# Patient Record
Sex: Female | Born: 1935 | Race: White | Hispanic: No
Health system: Southern US, Community
[De-identification: ages and names within clinical notes are randomized; demographics above are authoritative.]

## PROBLEM LIST (undated history)

## (undated) DIAGNOSIS — N179 Acute kidney failure, unspecified: Secondary | ICD-10-CM

## (undated) DIAGNOSIS — N183 Chronic kidney disease, stage 3 unspecified: Secondary | ICD-10-CM

## (undated) DIAGNOSIS — I82409 Acute embolism and thrombosis of unspecified deep veins of unspecified lower extremity: Secondary | ICD-10-CM

## (undated) DIAGNOSIS — I639 Cerebral infarction, unspecified: Secondary | ICD-10-CM

## (undated) DIAGNOSIS — R7989 Other specified abnormal findings of blood chemistry: Secondary | ICD-10-CM

## (undated) DIAGNOSIS — R011 Cardiac murmur, unspecified: Secondary | ICD-10-CM

## (undated) DIAGNOSIS — R519 Headache, unspecified: Secondary | ICD-10-CM

## (undated) DIAGNOSIS — I2699 Other pulmonary embolism without acute cor pulmonale: Secondary | ICD-10-CM

## (undated) DIAGNOSIS — G9340 Encephalopathy, unspecified: Secondary | ICD-10-CM

## (undated) DIAGNOSIS — R51 Headache: Secondary | ICD-10-CM

## (undated) DIAGNOSIS — R339 Retention of urine, unspecified: Secondary | ICD-10-CM

## (undated) DIAGNOSIS — C189 Malignant neoplasm of colon, unspecified: Secondary | ICD-10-CM

## (undated) DIAGNOSIS — I4891 Unspecified atrial fibrillation: Secondary | ICD-10-CM

## (undated) DIAGNOSIS — I10 Essential (primary) hypertension: Secondary | ICD-10-CM

## (undated) DIAGNOSIS — R197 Diarrhea, unspecified: Secondary | ICD-10-CM

## (undated) DIAGNOSIS — E875 Hyperkalemia: Secondary | ICD-10-CM

## (undated) DIAGNOSIS — D649 Anemia, unspecified: Secondary | ICD-10-CM

## (undated) DIAGNOSIS — R569 Unspecified convulsions: Secondary | ICD-10-CM

## (undated) HISTORY — DX: Unspecified atrial fibrillation: I48.91

## (undated) HISTORY — DX: Chronic kidney disease, stage 3 unspecified: N18.30

## (undated) HISTORY — DX: Acute embolism and thrombosis of unspecified deep veins of unspecified lower extremity: I82.409

## (undated) HISTORY — DX: Headache: R51

## (undated) HISTORY — DX: Malignant neoplasm of colon, unspecified: C18.9

## (undated) HISTORY — DX: Anemia, unspecified: D64.9

## (undated) HISTORY — DX: Chronic kidney disease, stage 3 (moderate): N18.3

## (undated) HISTORY — DX: Essential (primary) hypertension: I10

## (undated) HISTORY — DX: Other specified abnormal findings of blood chemistry: R79.89

## (undated) HISTORY — DX: Cerebral infarction, unspecified: I63.9

## (undated) HISTORY — DX: Headache, unspecified: R51.9

## (undated) HISTORY — DX: Encephalopathy, unspecified: G93.40

## (undated) HISTORY — DX: Acute kidney failure, unspecified: N17.9

## (undated) HISTORY — DX: Cardiac murmur, unspecified: R01.1

## (undated) HISTORY — DX: Diarrhea, unspecified: R19.7

## (undated) HISTORY — DX: Unspecified convulsions: R56.9

## (undated) HISTORY — DX: Hyperkalemia: E87.5

## (undated) HISTORY — DX: Other pulmonary embolism without acute cor pulmonale: I26.99

---
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
## 1971-07-07 HISTORY — PX: TOTAL ABDOMINAL HYSTERECTOMY W/ BILATERAL SALPINGOOPHORECTOMY: SHX83

## 2004-07-06 HISTORY — PX: HIP FRACTURE SURGERY: SHX118

## 2004-08-10 ENCOUNTER — Encounter: Payer: Self-pay | Admitting: Emergency Medicine

## 2004-08-11 ENCOUNTER — Ambulatory Visit: Payer: Self-pay | Admitting: Physical Medicine & Rehabilitation

## 2004-08-11 ENCOUNTER — Inpatient Hospital Stay (HOSPITAL_COMMUNITY): Admission: AD | Admit: 2004-08-11 | Discharge: 2004-08-14 | Payer: Self-pay | Admitting: General Surgery

## 2004-08-14 ENCOUNTER — Inpatient Hospital Stay
Admission: RE | Admit: 2004-08-14 | Discharge: 2004-08-21 | Payer: Self-pay | Admitting: Physical Medicine & Rehabilitation

## 2005-05-06 DIAGNOSIS — I2699 Other pulmonary embolism without acute cor pulmonale: Secondary | ICD-10-CM

## 2005-05-06 DIAGNOSIS — I82409 Acute embolism and thrombosis of unspecified deep veins of unspecified lower extremity: Secondary | ICD-10-CM

## 2005-05-06 HISTORY — DX: Acute embolism and thrombosis of unspecified deep veins of unspecified lower extremity: I82.409

## 2005-05-06 HISTORY — DX: Other pulmonary embolism without acute cor pulmonale: I26.99

## 2005-06-01 ENCOUNTER — Ambulatory Visit: Payer: Self-pay | Admitting: Internal Medicine

## 2005-06-01 ENCOUNTER — Inpatient Hospital Stay (HOSPITAL_COMMUNITY): Admission: EM | Admit: 2005-06-01 | Discharge: 2005-06-09 | Payer: Self-pay | Admitting: Emergency Medicine

## 2005-06-01 ENCOUNTER — Encounter: Admission: RE | Admit: 2005-06-01 | Discharge: 2005-06-01 | Payer: Self-pay | Admitting: Orthopedic Surgery

## 2005-06-06 ENCOUNTER — Ambulatory Visit: Payer: Self-pay | Admitting: Internal Medicine

## 2005-07-08 ENCOUNTER — Ambulatory Visit: Payer: Self-pay | Admitting: Internal Medicine

## 2005-07-16 ENCOUNTER — Ambulatory Visit: Payer: Self-pay | Admitting: Internal Medicine

## 2005-07-24 ENCOUNTER — Ambulatory Visit: Payer: Self-pay | Admitting: Internal Medicine

## 2005-08-03 ENCOUNTER — Ambulatory Visit: Payer: Self-pay | Admitting: Internal Medicine

## 2005-08-14 ENCOUNTER — Ambulatory Visit: Payer: Self-pay | Admitting: Internal Medicine

## 2005-09-07 ENCOUNTER — Ambulatory Visit: Payer: Self-pay | Admitting: Internal Medicine

## 2005-09-28 ENCOUNTER — Ambulatory Visit: Payer: Self-pay | Admitting: Internal Medicine

## 2005-10-05 ENCOUNTER — Ambulatory Visit: Payer: Self-pay | Admitting: Internal Medicine

## 2005-10-12 ENCOUNTER — Ambulatory Visit: Payer: Self-pay | Admitting: Internal Medicine

## 2005-10-26 ENCOUNTER — Ambulatory Visit: Payer: Self-pay | Admitting: Internal Medicine

## 2005-11-09 ENCOUNTER — Ambulatory Visit: Payer: Self-pay | Admitting: Internal Medicine

## 2005-12-04 ENCOUNTER — Ambulatory Visit: Payer: Self-pay | Admitting: Internal Medicine

## 2006-01-01 ENCOUNTER — Ambulatory Visit: Payer: Self-pay | Admitting: Internal Medicine

## 2006-02-01 ENCOUNTER — Ambulatory Visit: Payer: Self-pay | Admitting: Internal Medicine

## 2006-02-17 ENCOUNTER — Ambulatory Visit: Payer: Self-pay | Admitting: Internal Medicine

## 2006-03-03 ENCOUNTER — Ambulatory Visit: Payer: Self-pay | Admitting: Internal Medicine

## 2006-04-05 ENCOUNTER — Ambulatory Visit: Payer: Self-pay | Admitting: Internal Medicine

## 2006-04-30 ENCOUNTER — Ambulatory Visit: Payer: Self-pay | Admitting: Internal Medicine

## 2006-05-21 ENCOUNTER — Ambulatory Visit: Payer: Self-pay | Admitting: Internal Medicine

## 2006-06-18 ENCOUNTER — Ambulatory Visit: Payer: Self-pay | Admitting: Internal Medicine

## 2006-07-12 ENCOUNTER — Ambulatory Visit: Payer: Self-pay | Admitting: Internal Medicine

## 2006-07-15 DIAGNOSIS — D649 Anemia, unspecified: Secondary | ICD-10-CM

## 2006-07-15 DIAGNOSIS — Z86718 Personal history of other venous thrombosis and embolism: Secondary | ICD-10-CM | POA: Insufficient documentation

## 2006-07-26 ENCOUNTER — Encounter: Admission: RE | Admit: 2006-07-26 | Discharge: 2006-07-26 | Payer: Self-pay | Admitting: Orthopedic Surgery

## 2006-08-09 ENCOUNTER — Ambulatory Visit: Payer: Self-pay | Admitting: Internal Medicine

## 2006-09-06 ENCOUNTER — Ambulatory Visit: Payer: Self-pay | Admitting: Internal Medicine

## 2006-10-08 ENCOUNTER — Ambulatory Visit: Payer: Self-pay | Admitting: Internal Medicine

## 2006-11-05 ENCOUNTER — Ambulatory Visit: Payer: Self-pay | Admitting: Internal Medicine

## 2006-12-06 ENCOUNTER — Ambulatory Visit: Payer: Self-pay | Admitting: Internal Medicine

## 2006-12-28 ENCOUNTER — Ambulatory Visit: Payer: Self-pay | Admitting: Internal Medicine

## 2006-12-28 DIAGNOSIS — M25559 Pain in unspecified hip: Secondary | ICD-10-CM

## 2006-12-29 ENCOUNTER — Telehealth (INDEPENDENT_AMBULATORY_CARE_PROVIDER_SITE_OTHER): Payer: Self-pay | Admitting: *Deleted

## 2006-12-30 ENCOUNTER — Ambulatory Visit: Payer: Self-pay | Admitting: Cardiology

## 2006-12-30 ENCOUNTER — Encounter: Payer: Self-pay | Admitting: Internal Medicine

## 2007-01-03 ENCOUNTER — Inpatient Hospital Stay (HOSPITAL_COMMUNITY): Admission: RE | Admit: 2007-01-03 | Discharge: 2007-01-10 | Payer: Self-pay | Admitting: Orthopedic Surgery

## 2007-01-03 HISTORY — PX: TOTAL HIP ARTHROPLASTY: SHX124

## 2007-02-15 ENCOUNTER — Ambulatory Visit: Payer: Self-pay | Admitting: Internal Medicine

## 2007-03-01 ENCOUNTER — Ambulatory Visit: Payer: Self-pay | Admitting: Internal Medicine

## 2007-03-01 LAB — CONVERTED CEMR LAB: Prothrombin Time: 20.4 s

## 2007-04-01 ENCOUNTER — Encounter: Admission: RE | Admit: 2007-04-01 | Discharge: 2007-04-01 | Payer: Self-pay | Admitting: Orthopedic Surgery

## 2007-04-01 ENCOUNTER — Ambulatory Visit: Payer: Self-pay | Admitting: Internal Medicine

## 2007-04-22 ENCOUNTER — Ambulatory Visit: Payer: Self-pay | Admitting: Internal Medicine

## 2007-04-22 LAB — CONVERTED CEMR LAB: INR: 2.5

## 2007-05-20 ENCOUNTER — Ambulatory Visit: Payer: Self-pay | Admitting: Internal Medicine

## 2007-05-20 LAB — CONVERTED CEMR LAB: Prothrombin Time: 3.4 s

## 2007-06-24 ENCOUNTER — Ambulatory Visit: Payer: Self-pay | Admitting: Internal Medicine

## 2007-06-24 LAB — CONVERTED CEMR LAB: INR: 2.8

## 2007-07-13 ENCOUNTER — Telehealth (INDEPENDENT_AMBULATORY_CARE_PROVIDER_SITE_OTHER): Payer: Self-pay | Admitting: *Deleted

## 2007-07-29 ENCOUNTER — Ambulatory Visit: Payer: Self-pay | Admitting: Internal Medicine

## 2007-09-23 ENCOUNTER — Ambulatory Visit: Payer: Self-pay | Admitting: Internal Medicine

## 2007-09-23 LAB — CONVERTED CEMR LAB
INR: 2.3
Prothrombin Time: 18.4 s

## 2007-10-26 ENCOUNTER — Telehealth (INDEPENDENT_AMBULATORY_CARE_PROVIDER_SITE_OTHER): Payer: Self-pay | Admitting: *Deleted

## 2007-10-31 ENCOUNTER — Ambulatory Visit: Payer: Self-pay | Admitting: Internal Medicine

## 2007-10-31 LAB — CONVERTED CEMR LAB
INR: 1.6
Prothrombin Time: 15.5 s

## 2007-11-11 ENCOUNTER — Ambulatory Visit: Payer: Self-pay | Admitting: Internal Medicine

## 2007-11-11 DIAGNOSIS — H269 Unspecified cataract: Secondary | ICD-10-CM

## 2007-11-11 LAB — CONVERTED CEMR LAB
INR: 1.2
Prothrombin Time: 13.8 s

## 2007-11-14 ENCOUNTER — Encounter (INDEPENDENT_AMBULATORY_CARE_PROVIDER_SITE_OTHER): Payer: Self-pay | Admitting: *Deleted

## 2007-11-14 ENCOUNTER — Telehealth (INDEPENDENT_AMBULATORY_CARE_PROVIDER_SITE_OTHER): Payer: Self-pay | Admitting: *Deleted

## 2007-12-02 ENCOUNTER — Encounter: Payer: Self-pay | Admitting: Internal Medicine

## 2007-12-02 ENCOUNTER — Ambulatory Visit: Payer: Self-pay

## 2007-12-16 ENCOUNTER — Ambulatory Visit: Payer: Self-pay | Admitting: Internal Medicine

## 2007-12-16 DIAGNOSIS — I10 Essential (primary) hypertension: Secondary | ICD-10-CM | POA: Insufficient documentation

## 2007-12-16 LAB — CONVERTED CEMR LAB

## 2008-01-17 ENCOUNTER — Ambulatory Visit: Payer: Self-pay | Admitting: Internal Medicine

## 2008-01-17 LAB — CONVERTED CEMR LAB: INR: 2.2

## 2008-02-03 ENCOUNTER — Encounter: Admission: RE | Admit: 2008-02-03 | Discharge: 2008-02-03 | Payer: Self-pay | Admitting: Orthopedic Surgery

## 2008-02-07 ENCOUNTER — Ambulatory Visit: Payer: Self-pay | Admitting: Internal Medicine

## 2008-02-27 ENCOUNTER — Telehealth (INDEPENDENT_AMBULATORY_CARE_PROVIDER_SITE_OTHER): Payer: Self-pay | Admitting: *Deleted

## 2008-02-29 ENCOUNTER — Ambulatory Visit: Payer: Self-pay | Admitting: Internal Medicine

## 2008-03-30 ENCOUNTER — Ambulatory Visit: Payer: Self-pay | Admitting: Internal Medicine

## 2008-03-30 LAB — CONVERTED CEMR LAB: INR: 5.5

## 2008-04-12 ENCOUNTER — Telehealth (INDEPENDENT_AMBULATORY_CARE_PROVIDER_SITE_OTHER): Payer: Self-pay | Admitting: *Deleted

## 2008-04-20 ENCOUNTER — Ambulatory Visit: Payer: Self-pay | Admitting: Internal Medicine

## 2008-04-20 LAB — CONVERTED CEMR LAB: INR: 1.9

## 2008-05-18 ENCOUNTER — Ambulatory Visit: Payer: Self-pay | Admitting: Internal Medicine

## 2008-06-13 ENCOUNTER — Telehealth (INDEPENDENT_AMBULATORY_CARE_PROVIDER_SITE_OTHER): Payer: Self-pay | Admitting: *Deleted

## 2008-06-15 ENCOUNTER — Ambulatory Visit: Payer: Self-pay | Admitting: Internal Medicine

## 2008-06-15 LAB — CONVERTED CEMR LAB: INR: 1.9

## 2008-07-20 ENCOUNTER — Ambulatory Visit: Payer: Self-pay | Admitting: Internal Medicine

## 2008-07-20 LAB — CONVERTED CEMR LAB: INR: 4.2

## 2008-08-17 ENCOUNTER — Ambulatory Visit: Payer: Self-pay | Admitting: Internal Medicine

## 2008-08-23 ENCOUNTER — Ambulatory Visit: Payer: Self-pay | Admitting: Internal Medicine

## 2008-08-23 DIAGNOSIS — IMO0001 Reserved for inherently not codable concepts without codable children: Secondary | ICD-10-CM

## 2008-08-25 LAB — CONVERTED CEMR LAB
Iron: 55 ug/dL (ref 42–145)
Potassium: 4.5 meq/L (ref 3.5–5.1)

## 2008-08-27 ENCOUNTER — Encounter (INDEPENDENT_AMBULATORY_CARE_PROVIDER_SITE_OTHER): Payer: Self-pay | Admitting: *Deleted

## 2008-09-03 ENCOUNTER — Telehealth (INDEPENDENT_AMBULATORY_CARE_PROVIDER_SITE_OTHER): Payer: Self-pay | Admitting: *Deleted

## 2008-09-06 ENCOUNTER — Telehealth (INDEPENDENT_AMBULATORY_CARE_PROVIDER_SITE_OTHER): Payer: Self-pay | Admitting: *Deleted

## 2008-09-28 ENCOUNTER — Ambulatory Visit: Payer: Self-pay | Admitting: Internal Medicine

## 2008-09-28 LAB — CONVERTED CEMR LAB: INR: 2.7

## 2008-10-29 ENCOUNTER — Ambulatory Visit: Payer: Self-pay | Admitting: Internal Medicine

## 2008-11-05 ENCOUNTER — Ambulatory Visit: Payer: Self-pay | Admitting: Internal Medicine

## 2008-11-26 ENCOUNTER — Ambulatory Visit: Payer: Self-pay | Admitting: Internal Medicine

## 2008-11-26 LAB — CONVERTED CEMR LAB: INR: 1.8

## 2008-12-05 ENCOUNTER — Telehealth (INDEPENDENT_AMBULATORY_CARE_PROVIDER_SITE_OTHER): Payer: Self-pay | Admitting: *Deleted

## 2008-12-25 ENCOUNTER — Ambulatory Visit: Payer: Self-pay | Admitting: Internal Medicine

## 2009-01-01 ENCOUNTER — Telehealth (INDEPENDENT_AMBULATORY_CARE_PROVIDER_SITE_OTHER): Payer: Self-pay | Admitting: *Deleted

## 2009-01-21 ENCOUNTER — Ambulatory Visit: Payer: Self-pay | Admitting: Internal Medicine

## 2009-02-12 ENCOUNTER — Ambulatory Visit: Payer: Self-pay | Admitting: Internal Medicine

## 2009-02-28 ENCOUNTER — Telehealth (INDEPENDENT_AMBULATORY_CARE_PROVIDER_SITE_OTHER): Payer: Self-pay | Admitting: *Deleted

## 2009-03-05 ENCOUNTER — Ambulatory Visit: Payer: Self-pay | Admitting: Internal Medicine

## 2009-03-25 ENCOUNTER — Ambulatory Visit: Payer: Self-pay | Admitting: Internal Medicine

## 2009-03-25 LAB — CONVERTED CEMR LAB: INR: 2.8

## 2009-04-15 ENCOUNTER — Ambulatory Visit: Payer: Self-pay | Admitting: Internal Medicine

## 2009-05-27 ENCOUNTER — Ambulatory Visit: Payer: Self-pay | Admitting: Internal Medicine

## 2009-06-04 ENCOUNTER — Telehealth (INDEPENDENT_AMBULATORY_CARE_PROVIDER_SITE_OTHER): Payer: Self-pay | Admitting: *Deleted

## 2009-06-24 ENCOUNTER — Ambulatory Visit: Payer: Self-pay | Admitting: Internal Medicine

## 2009-06-25 ENCOUNTER — Encounter: Payer: Self-pay | Admitting: Internal Medicine

## 2009-06-25 LAB — CONVERTED CEMR LAB
INR: 2.4 — ABNORMAL HIGH (ref <1.50)
Prothrombin Time: 26 s — ABNORMAL HIGH (ref 11.6–15.2)

## 2009-06-26 ENCOUNTER — Telehealth (INDEPENDENT_AMBULATORY_CARE_PROVIDER_SITE_OTHER): Payer: Self-pay | Admitting: *Deleted

## 2009-07-06 DIAGNOSIS — C189 Malignant neoplasm of colon, unspecified: Secondary | ICD-10-CM

## 2009-07-06 HISTORY — DX: Malignant neoplasm of colon, unspecified: C18.9

## 2009-08-12 ENCOUNTER — Ambulatory Visit: Payer: Self-pay | Admitting: Internal Medicine

## 2009-08-13 ENCOUNTER — Telehealth (INDEPENDENT_AMBULATORY_CARE_PROVIDER_SITE_OTHER): Payer: Self-pay | Admitting: *Deleted

## 2009-09-03 ENCOUNTER — Telehealth (INDEPENDENT_AMBULATORY_CARE_PROVIDER_SITE_OTHER): Payer: Self-pay | Admitting: *Deleted

## 2009-09-20 ENCOUNTER — Ambulatory Visit: Payer: Self-pay | Admitting: Internal Medicine

## 2009-09-20 DIAGNOSIS — I951 Orthostatic hypotension: Secondary | ICD-10-CM

## 2009-09-20 LAB — CONVERTED CEMR LAB: INR: 3.1

## 2009-10-18 ENCOUNTER — Ambulatory Visit: Payer: Self-pay | Admitting: Internal Medicine

## 2009-10-18 DIAGNOSIS — R42 Dizziness and giddiness: Secondary | ICD-10-CM

## 2009-11-11 ENCOUNTER — Ambulatory Visit: Payer: Self-pay | Admitting: Internal Medicine

## 2009-11-11 ENCOUNTER — Ambulatory Visit: Payer: Self-pay

## 2010-02-27 ENCOUNTER — Telehealth (INDEPENDENT_AMBULATORY_CARE_PROVIDER_SITE_OTHER): Payer: Self-pay | Admitting: *Deleted

## 2010-06-02 ENCOUNTER — Telehealth (INDEPENDENT_AMBULATORY_CARE_PROVIDER_SITE_OTHER): Payer: Self-pay | Admitting: *Deleted

## 2010-06-05 HISTORY — PX: COLECTOMY: SHX59

## 2010-06-11 ENCOUNTER — Ambulatory Visit: Payer: Self-pay | Admitting: Internal Medicine

## 2010-06-11 ENCOUNTER — Inpatient Hospital Stay (HOSPITAL_COMMUNITY)
Admission: EM | Admit: 2010-06-11 | Discharge: 2010-07-01 | Payer: Self-pay | Source: Home / Self Care | Attending: Internal Medicine | Admitting: Internal Medicine

## 2010-06-11 ENCOUNTER — Encounter: Payer: Self-pay | Admitting: Internal Medicine

## 2010-06-11 DIAGNOSIS — R0609 Other forms of dyspnea: Secondary | ICD-10-CM

## 2010-06-11 DIAGNOSIS — R0989 Other specified symptoms and signs involving the circulatory and respiratory systems: Secondary | ICD-10-CM

## 2010-06-12 ENCOUNTER — Encounter (INDEPENDENT_AMBULATORY_CARE_PROVIDER_SITE_OTHER): Payer: Self-pay | Admitting: Internal Medicine

## 2010-06-16 ENCOUNTER — Encounter: Payer: Self-pay | Admitting: Gastroenterology

## 2010-06-16 LAB — HM COLONOSCOPY: HM Colonoscopy: ABNORMAL

## 2010-06-18 ENCOUNTER — Encounter (INDEPENDENT_AMBULATORY_CARE_PROVIDER_SITE_OTHER): Payer: Self-pay | Admitting: Internal Medicine

## 2010-06-24 ENCOUNTER — Ambulatory Visit: Payer: Self-pay | Admitting: Oncology

## 2010-06-26 ENCOUNTER — Telehealth: Payer: Self-pay | Admitting: Internal Medicine

## 2010-06-26 ENCOUNTER — Encounter (INDEPENDENT_AMBULATORY_CARE_PROVIDER_SITE_OTHER): Payer: Self-pay | Admitting: Internal Medicine

## 2010-06-28 HISTORY — PX: VENA CAVA FILTER PLACEMENT: SUR1032

## 2010-07-04 ENCOUNTER — Telehealth: Payer: Self-pay | Admitting: Internal Medicine

## 2010-07-08 ENCOUNTER — Other Ambulatory Visit: Payer: Self-pay | Admitting: Internal Medicine

## 2010-07-08 ENCOUNTER — Ambulatory Visit
Admission: RE | Admit: 2010-07-08 | Discharge: 2010-07-08 | Payer: Self-pay | Source: Home / Self Care | Attending: Internal Medicine | Admitting: Internal Medicine

## 2010-07-08 DIAGNOSIS — I4891 Unspecified atrial fibrillation: Secondary | ICD-10-CM | POA: Insufficient documentation

## 2010-07-08 DIAGNOSIS — Z85038 Personal history of other malignant neoplasm of large intestine: Secondary | ICD-10-CM | POA: Insufficient documentation

## 2010-07-09 LAB — CBC WITH DIFFERENTIAL/PLATELET
Basophils Absolute: 0 10*3/uL (ref 0.0–0.1)
Basophils Relative: 0.8 % (ref 0.0–3.0)
Eosinophils Absolute: 0.2 10*3/uL (ref 0.0–0.7)
Eosinophils Relative: 4.1 % (ref 0.0–5.0)
HCT: 33.7 % — ABNORMAL LOW (ref 36.0–46.0)
Hemoglobin: 11.1 g/dL — ABNORMAL LOW (ref 12.0–15.0)
Lymphocytes Relative: 21.7 % (ref 12.0–46.0)
Lymphs Abs: 0.8 10*3/uL (ref 0.7–4.0)
MCHC: 33.1 g/dL (ref 30.0–36.0)
MCV: 86 fl (ref 78.0–100.0)
Monocytes Absolute: 0.6 10*3/uL (ref 0.1–1.0)
Monocytes Relative: 14.5 % — ABNORMAL HIGH (ref 3.0–12.0)
Neutro Abs: 2.3 10*3/uL (ref 1.4–7.7)
Neutrophils Relative %: 58.9 % (ref 43.0–77.0)
Platelets: 167 10*3/uL (ref 150.0–400.0)
RBC: 3.92 Mil/uL (ref 3.87–5.11)
RDW: 25.3 % — ABNORMAL HIGH (ref 11.5–14.6)
WBC: 3.8 10*3/uL — ABNORMAL LOW (ref 4.5–10.5)

## 2010-07-14 ENCOUNTER — Telehealth: Payer: Self-pay | Admitting: Internal Medicine

## 2010-07-14 ENCOUNTER — Encounter: Payer: Self-pay | Admitting: Internal Medicine

## 2010-07-31 ENCOUNTER — Telehealth (INDEPENDENT_AMBULATORY_CARE_PROVIDER_SITE_OTHER): Payer: Self-pay | Admitting: *Deleted

## 2010-07-31 ENCOUNTER — Other Ambulatory Visit: Payer: Self-pay | Admitting: Internal Medicine

## 2010-07-31 ENCOUNTER — Ambulatory Visit
Admission: RE | Admit: 2010-07-31 | Discharge: 2010-07-31 | Payer: Self-pay | Source: Home / Self Care | Attending: Internal Medicine | Admitting: Internal Medicine

## 2010-07-31 DIAGNOSIS — R1310 Dysphagia, unspecified: Secondary | ICD-10-CM | POA: Insufficient documentation

## 2010-08-01 ENCOUNTER — Ambulatory Visit: Payer: Self-pay | Admitting: Oncology

## 2010-08-01 ENCOUNTER — Telehealth (INDEPENDENT_AMBULATORY_CARE_PROVIDER_SITE_OTHER): Payer: Self-pay | Admitting: *Deleted

## 2010-08-01 LAB — CREATININE, SERUM: Creatinine, Ser: 1.2 mg/dL (ref 0.4–1.2)

## 2010-08-01 LAB — BUN: BUN: 8 mg/dL (ref 6–23)

## 2010-08-01 LAB — CBC WITH DIFFERENTIAL/PLATELET
Basophils Absolute: 0 10*3/uL (ref 0.0–0.1)
Basophils Relative: 0 % (ref 0.0–3.0)
Eosinophils Absolute: 0.2 10*3/uL (ref 0.0–0.7)
Lymphocytes Relative: 34.7 % (ref 12.0–46.0)
Lymphs Abs: 1.3 10*3/uL (ref 0.7–4.0)
MCHC: 32.2 g/dL (ref 30.0–36.0)
Monocytes Absolute: 0.7 10*3/uL (ref 0.1–1.0)
Monocytes Relative: 19.5 % — ABNORMAL HIGH (ref 3.0–12.0)
Neutro Abs: 1.5 10*3/uL (ref 1.4–7.7)
RBC: 4.35 Mil/uL (ref 3.87–5.11)
WBC: 3.7 10*3/uL — ABNORMAL LOW (ref 4.5–10.5)

## 2010-08-01 LAB — POTASSIUM: Potassium: 4 mEq/L (ref 3.5–5.1)

## 2010-08-04 ENCOUNTER — Telehealth (INDEPENDENT_AMBULATORY_CARE_PROVIDER_SITE_OTHER): Payer: Self-pay | Admitting: *Deleted

## 2010-08-05 ENCOUNTER — Encounter: Payer: Self-pay | Admitting: Internal Medicine

## 2010-08-05 LAB — CBC WITH DIFFERENTIAL/PLATELET
BASO%: 0.4 % (ref 0.0–2.0)
Eosinophils Absolute: 0.1 10*3/uL (ref 0.0–0.5)
MCHC: 32.6 g/dL (ref 31.5–36.0)
MCV: 86.1 fL (ref 79.5–101.0)
MONO%: 12.1 % (ref 0.0–14.0)
NEUT#: 2.1 10*3/uL (ref 1.5–6.5)
RBC: 4.4 10*6/uL (ref 3.70–5.45)
RDW: 19.6 % — ABNORMAL HIGH (ref 11.2–14.5)
WBC: 3.5 10*3/uL — ABNORMAL LOW (ref 3.9–10.3)

## 2010-08-05 LAB — COMPREHENSIVE METABOLIC PANEL
ALT: <8 U/L (ref 0–35)
AST: 13 U/L (ref 0–37)
Alkaline Phosphatase: 44 U/L (ref 39–117)
CO2: 23 mEq/L (ref 19–32)
Creatinine, Ser: 1.02 mg/dL (ref 0.40–1.20)
Sodium: 141 mEq/L (ref 135–145)
Total Bilirubin: 0.5 mg/dL (ref 0.3–1.2)
Total Protein: 6.3 g/dL (ref 6.0–8.3)

## 2010-08-05 LAB — CEA: CEA: 1.9 ng/mL (ref 0.0–5.0)

## 2010-08-05 NOTE — Progress Notes (Signed)
Summary: refill request  Phone Note Refill Request Message from:  Fax from Pharmacy on September 03, 2009 3:01 PM  Refills Requested: Medication #1:  BENAZEPRIL HCL 40 MG  TABS Take one daily   Dosage confirmed as above?Dosage Confirmed  Medication #2:  HYDROCHLOROTHIAZIDE 12.5 MG  CAPS 1 qd   Dosage confirmed as above?Dosage Confirmed refill authorization to [facility] on [address] in [place]  Next Appointment Scheduled: 09/20/09 Initial call taken by: Michaelle Copas,  September 03, 2009 3:02 PM    Prescriptions: HYDROCHLOROTHIAZIDE 12.5 MG  CAPS (HYDROCHLOROTHIAZIDE) 1 qd  #90 x 1   Entered by:   Shonna Chock   Authorized by:   Marga Melnick MD   Signed by:   Shonna Chock on 09/03/2009   Method used:   Electronically to        [name] ([facility])       [address] [place], [place]  [address]       Ph: [phone]       Fax: [phone]   RxID:   [id number] BENAZEPRIL HCL 40 MG  TABS (BENAZEPRIL HCL) Take one daily  #90 x 1   Entered by:   Shonna Chock   Authorized by:   Marga Melnick MD   Signed by:   Shonna Chock on 09/03/2009   Method used:   Electronically to        [name] (ret[facility])       [address] [place], [place]  [address]       Ph: [phone]       Fax: [phone]   RxID:   [id number]

## 2010-08-05 NOTE — Progress Notes (Signed)
Summary: REFILL REQUEST  Phone Note Refill Request Call back at [phone] Message from:  Pharmacy on February 27, 2010 2:29 PM  Refills Requested: Medication #1:  BENAZEPRIL HCL 40 MG  TABS Take one daily   Dosage confirmed as above?Dosage Confirmed   Supply Requested: 3 months   Last Refilled: 12/02/2009  Medication #2:  HYDROCHLOROTHIAZIDE 12.5 MG  CAPS 1 qd   Dosage confirmed as above?Dosage Confirmed   Supply Requested: 3 months   Last Refilled: 12/02/2009 [facility] [address]   Next Appointment Scheduled: NONE Initial call taken by: Lavell Islam,  February 27, 2010 2:31 PM    Prescriptions: HYDROCHLOROTHIAZIDE 12.5 MG  CAPS (HYDROCHLOROTHIAZIDE) 1 qd  #90 x 2   Entered by:   Shonna Chock CMA   Authorized by:   Marga Melnick MD   Signed by:   Shonna Chock CMA on 02/27/2010   Method used:   Electronically to        [name] ([facility])       [address] [place], [place]  [address]       Ph: [phone]       Fax: [phone]   RxID:   [id number] BENAZEPRIL HCL 40 MG  TABS (BENAZEPRIL HCL) Take one daily  #90 x 2   Entered by:   Shonna Chock CMA   Authorized by:   Marga Melnick MD   Signed by:   Shonna Chock CMA on 02/27/2010   Method used:   Electronically to        [name] ([facility])       [address] [place], [place]  [address]       Ph: [phone]       Fax: [phone]   RxID:   [id number]

## 2010-08-05 NOTE — Assessment & Plan Note (Signed)
Summary: NURSE VISIT FOR BP, WILL GO TO LAB FIRST FOR PT CHECK///SPH  Nurse Visit   Vital Signs:  Patient profile:   77 year old female Weight:      208 pounds Pulse rate:   60 / minute BP sitting:   120 / 62  (left arm) Cuff size:   large  Vitals Entered By: Shonna Chock (August 12, 2009 4:22 PM)  Impression & Recommendations:  Problem # 1:  HYPERTENSION, ESSENTIAL NOS (ICD-401.9)  Her updated medication list for this problem includes:    Benazepril Hcl 40 Mg Tabs (Benazepril hcl) .Marland Kitchen... Take one daily    Labetalol Hcl 300 Mg Tabs (Labetalol hcl) .Marland Kitchen... 1 bid    Hydrochlorothiazide 12.5 Mg Caps (Hydrochlorothiazide) .Marland Kitchen... 1 qd  Orders: No Charge Patient Arrived (NCPA0) (NCPA0)  Complete Medication List: 1)  Coumadin 2.5 Mg Tabs (Warfarin sodium) .... 2.5 mg on mon, tues, wed, thur and friday 2)  Warfarin Sodium 5 Mg Tabs (Warfarin sodium) .... Saturday and sunday 3)  Benazepril Hcl 40 Mg Tabs (Benazepril hcl) .... Take one daily 4)  Darvocet A500 100-500 Mg Tabs (Propoxyphene n-apap) .Marland Kitchen.. 1 by mouth every 6 hours as needed 5)  Labetalol Hcl 300 Mg Tabs (Labetalol hcl) .Marland Kitchen.. 1 bid 6)  Hydrochlorothiazide 12.5 Mg Caps (Hydrochlorothiazide) .Marland Kitchen.. 1 qd 7)  Calcium 600mg -vit D 800iu  .Marland Kitchen.. 1 by mouth two times a day 8)  Promethazine Hcl 25 Mg Tabs (Promethazine hcl) .... 1/2 to 1 every 6 - 8 hours 9)  Nasonex 50 Mcg/act Susp (Mometasone furoate) .Marland Kitchen.. 1 spray two times a day  Other Orders: Venipuncture ([address])   Patient Instructions: 1)  Check your Blood Pressure regularly. If it is above: 140/90 ON AVERAGE you should make an appointment.  CC: B/P Check. Patient with slight Headache and dizziness but thinks its related to sinuses. Patient didnt feel the need to be seen today and indicated she will call if no better. Patient said "I promise ill call if no better". Per Dr.Hopper give patient Nasonex to try. Comments Current Coumadin dose 5mg  M/F, all other days 2.5mg . No missed  doses and no change in diet Patient aware I will cal her with results./Chrae [facility]  August 12, 2009 4:24 PM     Allergies (verified): No Known Drug Allergies  Orders Added: 1)  Venipuncture [60454] 2)  No Charge Patient Arrived (NCPA0) [NCPA0]

## 2010-08-05 NOTE — Miscellaneous (Signed)
Summary: Orders Update  Clinical Lists Changes  Orders: Added new Test order of Venous Duplex Lower Extremity (Venous Duplex Lower) - Signed 

## 2010-08-05 NOTE — Assessment & Plan Note (Signed)
Summary: bp check/pt check/kdc  Nurse Visit   Vital Signs:  Patient profile:   77 year old female Weight:      206.0 pounds Temp:     97.7 degrees F oral Pulse rate:   76 / minute Resp:     17 per minute BP sitting:   116 / 48  (left arm) Cuff size:   large  Vitals Entered By: Shonna Chock (September 20, 2009 4:15 PM)  CC:  1.) PT check 2.) Low B/P (bottom number dropping lower and lower).  History of Present Illness: Postural hypotension  symptoms  for > 1 month; no BPV symptoms. Her diastolic BP in 40s @ home.Meds reviewed  & verified. PT/INR reviewed & dose adjusted.   Review of Systems CV:  Denies chest pain or discomfort, leg cramps with exertion, palpitations, swelling of feet, and swelling of hands. Neuro:  Complains of headaches; denies brief paralysis, numbness, sensation of room spinning, tingling, and weakness; Headaches over crown ; partial response to Tylenol..   Physical Exam  General:  in no acute distress; alert,appropriate and cooperative throughout examination Eyes:  No corneal or conjunctival inflammation noted. EOMI. Perrla.No nystagmus Lungs:  Normal respiratory effort, chest expands symmetrically. Lungs are clear to auscultation, no crackles or wheezes. Heart:  Normal rate and regular rhythm. S1 and S2 normal without gallop, murmur, click, rub. S4 Pulses:  R and L carotid,radial,dorsalis pedis and posterior tibial pulses are full and equal bilaterally Neurologic:  strength normal in all extremities.  Using cane    Impression & Recommendations:  Problem # 1:  HYPOTENSION, ORTHOSTATIC (ICD-458.0)  Problem # 2:  ENCOUNTER FOR THERAPEUTIC DRUG MONITORING (ICD-V58.83)  Orders: Protime (16109UE)  Problem # 3:  PULMONARY EMBOLISM, HX OF (ICD-V12.51)  Her updated medication list for this problem includes:    Coumadin 2.5 Mg Tabs (Warfarin sodium) .Marland Kitchen... 2.5 mg on mon, tues, wed, thur and friday    Warfarin Sodium 5 Mg Tabs (Warfarin sodium) ..... Saturday  and sunday  Problem # 4:  MUSCLE PAIN (ICD-729.1)  Her updated medication list for this problem includes:    Darvocet A500 100-500 Mg Tabs (Propoxyphene n-apap) .Marland Kitchen... 1 by mouth every 6 hours as needed    Tramadol Hcl 50 Mg Tabs (Tramadol hcl) .Marland Kitchen... 1 every  6 hrs as needed pain  Orders: Prescription Created Electronically [id number])  Complete Medication List: 1)  Coumadin 2.5 Mg Tabs (Warfarin sodium) .... 2.5 mg on mon, tues, wed, thur and friday 2)  Warfarin Sodium 5 Mg Tabs (Warfarin sodium) .... Saturday and sunday 3)  Benazepril Hcl 40 Mg Tabs (Benazepril hcl) .... Take one daily 4)  Darvocet A500 100-500 Mg Tabs (Propoxyphene n-apap) .Marland Kitchen.. 1 by mouth every 6 hours as needed 5)  Labetalol Hcl 300 Mg Tabs (Labetalol hcl) .... 1/2 at bedtime 6)  Hydrochlorothiazide 12.5 Mg Caps (Hydrochlorothiazide) .Marland Kitchen.. 1 qd 7)  Calcium 600mg -vit D 800iu  .Marland Kitchen.. 1 by mouth two times a day 8)  Nasonex 50 Mcg/act Susp (Mometasone furoate) .Marland Kitchen.. 1 spray two times a day 9)  Promethazine Hcl 25 Mg Tabs (Promethazine hcl) .Marland Kitchen.. 1 q 6-8 hrs as needed 10)  Tramadol Hcl 50 Mg Tabs (Tramadol hcl) .Marland Kitchen.. 1 every  6 hrs as needed pain   Patient Instructions: 1)  Stop am dose of Labetalol. Coumadin 2.5 mg once daily except 5 mg on Weds.PT/INR in 4 weeks.Isometrics pre standing.  CC: 1.) PT check 2.) Low B/P (bottom number dropping lower and lower) Comments REVIEWED MED  LIST, PATIENT AGREED DOSE AND INSTRUCTION CORRECT    Allergies: No Known Drug Allergies Laboratory Results   Blood Tests      INR: 3.1   (Normal Range: 0.88-1.12   Therap INR: 2.0-3.5)    Orders Added: 1)  Protime [85610QW] 2)  Est. Patient Level III [56433] 3)  Prescription Created Electronically [phone] Prescriptions: TRAMADOL HCL 50 MG TABS (TRAMADOL HCL) 1 every  6 hrs as needed pain  #30 x 2   Entered and Authorized by:   Marga Melnick MD   Signed by:   Marga Melnick MD on 09/20/2009   Method used:   Faxed to ...       [name] ([facility])       [address] [place], [place]  [address]       Ph: [phone]       Fax: [phone]   RxID:   [id number] PROMETHAZINE HCL 25 MG TABS (PROMETHAZINE HCL) 1 q 6-8 hrs as needed  #6 x 0   Entered and Authorized by:   Marga Melnick MD   Signed by:   Marga Melnick MD on 09/20/2009   Method used:   Print then Give to Patient   RxID:   [id number]    ANTICOAGULATION RECORD PREVIOUS REGIMEN & LAB RESULTS Anticoagulation Diagnosis:  Atrial fibrillation on  09/28/2008 Previous INR Goal Range:  2-3 on  12/16/2007 Previous INR:  2.31 on  08/12/2009 Previous Coumadin Dose(mg):  2.5mg  daily except M/F 5mg  on  05/27/2009 Previous Regimen:  no change on  05/27/2009 Previous Coagulation Comments:  recheck 3 weeks  on  09/28/2008  NEW REGIMEN & LAB RESULTS Current INR: 3.1 Current Coumadin Dose(mg): 5mg  M/F and 2.5mg  all other days Regimen: no change  (no change)  Provider: Terin Cragle Other Comments: Last Check was 2.3  MEDICATIONS COUMADIN 2.5 MG  TABS (WARFARIN SODIUM) 2.5 mg on Mon, Tues, Wed, Thur and Friday WARFARIN SODIUM 5 MG  TABS (WARFARIN SODIUM) Saturday and Sunday BENAZEPRIL HCL 40 MG  TABS (BENAZEPRIL HCL) Take one daily DARVOCET A500 100-500 MG  TABS (PROPOXYPHENE N-APAP) 1 by mouth EVERY 6 HOURS as needed LABETALOL HCL 300 MG  TABS (LABETALOL HCL) 1/2 at bedtime HYDROCHLOROTHIAZIDE 12.5 MG  CAPS (HYDROCHLOROTHIAZIDE) 1 qd * CALCIUM 600MG -VIT D 800IU 1 by mouth two times a day NASONEX 50 MCG/ACT SUSP (MOMETASONE FUROATE) 1 spray two times a day PROMETHAZINE HCL 25 MG TABS (PROMETHAZINE HCL) 1 q 6-8 hrs as needed TRAMADOL HCL 50 MG TABS (TRAMADOL HCL) 1 every  6 hrs as needed pain   Anticoagulation Visit Questionnaire      Coumadin dose missed/changed:  No      Abnormal Bleeding Symptoms:  No   Any diet changes including alcohol intake, vegetables or greens since the last visit:  No Any illnesses or hospitalizations  since the last visit:  No Any signs of clotting since the last visit (including chest discomfort, dizziness, shortness of breath, arm tingling, slurred speech, swelling or redness in leg):  No

## 2010-08-05 NOTE — Progress Notes (Signed)
Summary: Refill Request  Phone Note Refill Request Call back at [phone] Message from:  Pharmacy on June 02, 2010 1:08 PM  Refills Requested: Medication #1:  PROMETHAZINE HCL 25 MG TABS 1 q 6-8 hrs as needed   Dosage confirmed as above?Dosage Confirmed   Supply Requested: 1 month   Last Refilled: 09/20/2009 [facility] [address] in [place]  Next Appointment Scheduled: none Initial call taken by: Harold Barban,  June 02, 2010 1:09 PM  Follow-up for Phone Call        Dr.Hopper patient was given #6 on 09/20/09, can a higher dispense number be given? Follow-up by: Shonna Chock CMA,  June 02, 2010 1:48 PM  Additional Follow-up for Phone Call Additional follow up Details #1::        she needs to be seen if she is taking this frequently; #6 Additional Follow-up by: Marga Melnick MD,  June 02, 2010 5:50 PM    Additional Follow-up for Phone Call Additional follow up Details #2::    I spoke with patient's daughter and she indicated her mother does not take this frequently./Chrae [facility] CMA  June 03, 2010 8:27 AM   Prescriptions: PROMETHAZINE HCL 25 MG TABS (PROMETHAZINE HCL) 1 q 6-8 hrs as needed  #6 x 0   Entered by:   Shonna Chock CMA   Authorized by:   Marga Melnick MD   Signed by:   Shonna Chock CMA on 06/03/2010   Method used:   Electronically to        [name] ([facility])       [address] [place], [place]  [address]       Ph: [phone]       Fax: [phone]   RxID:   [id number]

## 2010-08-05 NOTE — Progress Notes (Signed)
Summary: PT/INR Results  Phone Note Outgoing Call   Call placed by: Shonna Chock,  August 13, 2009 4:12 PM Call placed to: Patient Summary of Call: Spoke with patient, informed patient keep medication the same and recheck PT/INR in 1 month Level was good at 2.3  Patient ok'd and said her daughter will call to scheudule appointment once she checks her schedule Initial call taken by: Shonna Chock,  August 13, 2009 4:12 PM

## 2010-08-05 NOTE — Assessment & Plan Note (Signed)
Summary: 4 WK FOLLOW UP/RH......   Vital Signs:  Patient profile:   77 year old female Weight:      204 pounds Temp:     98.2 degrees F oral Pulse rate:   82 / minute Resp:     15 per minute BP sitting:   110 / 58  (left arm)  Vitals Entered By: Jeremy Johann CMA (October 18, 2009 3:52 PM) CC: pt check, BP check, Hypertension Management Comments REVIEWED MED LIST, PATIENT AGREED DOSE AND INSTRUCTION CORRECT    CC:  pt check, BP check, and Hypertension Management.  History of Present Illness: Occasional dizziness posturally; see BP . BP varies @ home , but typically 118/56.  Hypertension History:      She complains of headache, but denies chest pain, palpitations, orthopnea, PND, peripheral edema, visual symptoms, neurologic problems, and syncope.  Sinus pressure & headaches seasonally.Minor DOE.        Positive major cardiovascular risk factors include female age 42 years old or older and hypertension.  Negative major cardiovascular risk factors include non-tobacco-user status.     Allergies (verified): No Known Drug Allergies  Review of Systems Eyes:  Denies blurring, double vision, and vision loss-both eyes. GI:  Denies abdominal pain, bloody stools, dark tarry stools, and indigestion. Neuro:  Denies numbness and tingling.  Physical Exam  General:  in no acute distress; alert,appropriate and cooperative throughout examination Lungs:  Normal respiratory effort, chest expands symmetrically. Lungs are clear to auscultation, no crackles or wheezes but BS decreased. Heart:  Normal rate and regular rhythm. S1 and S2 normal without gallop, murmur, click, rub. Pulses:  R and L carotid,radial  pulses are full and equal bilaterally. Decreased pedal pulses. Homan's negative Extremities:  trace left pedal edema and trace right pedal edema.     Impression & Recommendations:  Problem # 1:  HYPOTENSION, ORTHOSTATIC (ICD-458.0)  Problem # 2:  DIZZINESS (ICD-780.4)  Her updated  medication list for this problem includes:    Promethazine Hcl 25 Mg Tabs (Promethazine hcl) .Marland Kitchen... 1 q 6-8 hrs as needed  Problem # 3:  HYPERTENSION, ESSENTIAL NOS (ICD-401.9)  The following medications were removed from the medication list:    Labetalol Hcl 300 Mg Tabs (Labetalol hcl) .Marland Kitchen... 1/2 at bedtime Her updated medication list for this problem includes:    Benazepril Hcl 40 Mg Tabs (Benazepril hcl) .Marland Kitchen... Take one daily    Hydrochlorothiazide 12.5 Mg Caps (Hydrochlorothiazide) .Marland Kitchen... 1 qd  Problem # 4:  ENCOUNTER FOR THERAPEUTIC DRUG MONITORING (ICD-V58.83)  Orders: Protime ([id number])  Complete Medication List: 1)  Coumadin 2.5 Mg Tabs (Warfarin sodium) .... 2.5 mg on mon, tues, wed, thur and friday 2)  Warfarin Sodium 5 Mg Tabs (Warfarin sodium) .... Wednesday 3)  Benazepril Hcl 40 Mg Tabs (Benazepril hcl) .... Take one daily 4)  Darvocet A500 100-500 Mg Tabs (Propoxyphene n-apap) .Marland Kitchen.. 1 by mouth every 6 hours as needed 5)  Hydrochlorothiazide 12.5 Mg Caps (Hydrochlorothiazide) .Marland Kitchen.. 1 qd 6)  Calcium 600mg -vit D 800iu  .Marland Kitchen.. 1 by mouth two times a day 7)  Nasonex 50 Mcg/act Susp (Mometasone furoate) .Marland Kitchen.. 1 spray two times a day 8)  Promethazine Hcl 25 Mg Tabs (Promethazine hcl) .Marland Kitchen.. 1 q 6-8 hrs as needed 9)  Tramadol Hcl 50 Mg Tabs (Tramadol hcl) .Marland Kitchen.. 1 every  6 hrs as needed pain  Other Orders: Radiology Referral (Radiology)  Hypertension Assessment/Plan:      The patient's hypertensive risk group is category B: At least one  risk factor (excluding diabetes) with no target organ damage.  Today's blood pressure is 110/58.    Patient Instructions: 1)  Decrease Coumadin to 2.5 mg once daily ; recheck PT/INR in 4 weeks. Isometric exercises before standing. Stop Labetalol.  Laboratory Results   Blood Tests    Date/Time Reported: October 18, 2009 3:54 PM   INR: 3.4   (Normal Range: 0.88-1.12   Therap INR: 2.0-3.5)      ANTICOAGULATION RECORD PREVIOUS REGIMEN & LAB  RESULTS Anticoagulation Diagnosis:  Atrial fibrillation on  09/28/2008 Previous INR Goal Range:  2-3 on  12/16/2007 Previous INR:  3.1 on  09/20/2009 Previous Coumadin Dose(mg):  5mg  M/F and 2.5mg  all other days on  09/20/2009 Previous Regimen:  no change on  05/27/2009 Previous Coagulation Comments:  recheck 3 weeks  on  09/28/2008  NEW REGIMEN & LAB RESULTS Current INR: 3.4 Current Coumadin Dose(mg): 2.5 daily except 5 mg wed Regimen: no change  (no change)   Anticoagulation Visit Questionnaire Coumadin dose missed/changed:  No Abnormal Bleeding Symptoms:  No  Any diet changes including alcohol intake, vegetables or greens since the last visit:  No Any illnesses or hospitalizations since the last visit:  No Any signs of clotting since the last visit (including chest discomfort, dizziness, shortness of breath, arm tingling, slurred speech, swelling or redness in leg):  No  MEDICATIONS COUMADIN 2.5 MG  TABS (WARFARIN SODIUM) 2.5 mg on Mon, Tues, Wed, Thur and Friday WARFARIN SODIUM 5 MG  TABS (WARFARIN SODIUM) wednesday BENAZEPRIL HCL 40 MG  TABS (BENAZEPRIL HCL) Take one daily DARVOCET A500 100-500 MG  TABS (PROPOXYPHENE N-APAP) 1 by mouth EVERY 6 HOURS as needed HYDROCHLOROTHIAZIDE 12.5 MG  CAPS (HYDROCHLOROTHIAZIDE) 1 qd * CALCIUM 600MG -VIT D 800IU 1 by mouth two times a day NASONEX 50 MCG/ACT SUSP (MOMETASONE FUROATE) 1 spray two times a day PROMETHAZINE HCL 25 MG TABS (PROMETHAZINE HCL) 1 q 6-8 hrs as needed TRAMADOL HCL 50 MG TABS (TRAMADOL HCL) 1 every  6 hrs as needed pain
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
## 2010-08-07 NOTE — Progress Notes (Signed)
Summary: has chest congestion and cough  Phone Note Call from Patient   Caller: daughter Jasmine December  [phone] Summary of Call: patient was discharged from hospital a few days ago and now has gotten the cough and cold that is going around  symptoms are chest cough=non productive and sore throat---has slight runny nose, no fever that she can tell; daughter says she sounds "rattle-y"  daughter wants to know what OTC medications that Dr Alwyn Ren would suggest (especially with her new meds prescribed in the hospital)  Please call daughter Jasmine December at [phone] Initial call taken by: Jerolyn Shin,  July 04, 2010 10:27 AM  Follow-up for Phone Call        Pls advise....Marland KitchenMarland KitchenFelecia Deloach CMA  July 04, 2010 11:22 AM   Additional Follow-up for Phone Call Additional follow up Details #1::        Mucinex DM OTC; generic Z pack  Additional Follow-up by: Marga Melnick MD,  July 04, 2010 1:37 PM    Additional Follow-up for Phone Call Additional follow up Details #2::    Pt daughter aware Rx sent to pharmacy.............Marland KitchenFelecia Deloach CMA  July 04, 2010 3:12 PM   New/Updated Medications: ZITHROMAX Z-PAK 250 MG TABS (AZITHROMYCIN) As directed Prescriptions: ZITHROMAX Z-PAK 250 MG TABS (AZITHROMYCIN) As directed  #1 x 0   Entered by:   Jeremy Johann CMA   Authorized by:   Marga Melnick MD   Signed by:   Jeremy Johann CMA on 07/04/2010   Method used:   Faxed to ...       [name] (retail)       [address] [place], [place]  [address]       Ph: [phone]       Fax: [phone]   RxID:   [id number]
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
## 2010-08-07 NOTE — Assessment & Plan Note (Signed)
Summary: 4 week followup///sph   Vital Signs:  Patient profile:   77 year old female Weight:      182.2 pounds Temp:     97.9 degrees F oral Pulse rate:   60 / minute Resp:     17 per minute BP sitting:   120 / 58  (left arm) Cuff size:   large  Vitals Entered By: Shonna Chock CMA (July 31, 2010 3:04 PM) CC: 4 week follow-up, Heartburn   CC:  4 week follow-up and Heartburn.  History of Present Illness:   [name] is asymptomatic except for some  loss of appetite. The patient denies acid reflux, sour taste in mouth, epigastric pain, chest pain, and trouble swallowing.  The patient reports the following alarm features of dyspepsia: dysphagia with almost all meals.  The patient denies the following alarm features: melena, hematemesis, and vomiting.    Current Medications (verified): 1)  Tramadol Hcl 50 Mg Tabs (Tramadol Hcl) .Marland Kitchen.. 1 Every  6 Hrs As Needed Pain 2)  Ferrex 150 150 Mg Caps (Polysaccharide Iron Complex) .Marland Kitchen.. 1 By Mouth Once Daily 3)  Diltiazem Hcl Er Beads 300 Mg Xr24h-Cap (Diltiazem Hcl Er Beads) .Marland Kitchen.. 1 By Mouth Once Daily 4)  Alprazolam 0.25 Mg Tabs (Alprazolam) .Marland Kitchen.. 1 By Mouth Two Times A Day As Needed 5)  Calcium 1200mg / Vit D 1000 .Marland Kitchen.. 1 By Mouth Once Daily  Allergies (verified): No Known Drug Allergies  Review of Systems General:  Denies chills, fever, and sweats; Weight down 20# since admission. CV:  Complains of swelling of feet; denies difficulty breathing at night and difficulty breathing while lying down.  Physical Exam  General:  in no acute distress; alert,appropriate and cooperative throughout examination Mouth:  Oral mucosa and oropharynx without lesions or exudates.Multiple carious teeth . No pharyngeal erythema.   Lungs:  Normal respiratory effort, chest expands symmetrically. Lungs are clear to auscultation, no crackles or wheezes. Heart:  Normal rate and regular rhythm. S1 and S2 normal without gallop, murmur, click, rub or other extra  sounds. Extremities:  trace left pedal edema and 1/2 + right pedal edema.   Skin:  Intact without suspicious lesions or rashes Cervical Nodes:  No lymphadenopathy noted Axillary Nodes:  No palpable lymphadenopathy Psych:  memory intact for recent and remote, normally interactive, and good eye contact.     Impression & Recommendations:  Problem # 1:  DYSPHAGIA UNSPECIFIED (ICD-787.20)  Orders: Venipuncture ([address]) TLB-CBC Platelet - w/Differential (85025-CBCD)  Problem # 2:  ANEMIA, SEVERE (ICD-285.9)  Her updated medication list for this problem includes:    Ferrex 150 150 Mg Caps (Polysaccharide iron complex) .Marland Kitchen... 1 by mouth once daily  Orders: Venipuncture (60454) TLB-CBC Platelet - w/Differential (85025-CBCD)  Problem # 3:  EDEMA- LOCALIZED (ICD-782.3)  probably due to decreased albumin  Orders: Venipuncture (09811) TLB-Creatinine, Blood (82565-CREA) TLB-Potassium (K+) (84132-K) TLB-BUN (Urea Nitrogen) (84520-BUN)  Complete Medication List: 1)  Tramadol Hcl 50 Mg Tabs (Tramadol hcl) .Marland Kitchen.. 1 every  6 hrs as needed pain 2)  Ferrex 150 150 Mg Caps (Polysaccharide iron complex) .Marland Kitchen.. 1 by mouth once daily 3)  Diltiazem Hcl Er Beads 300 Mg Xr24h-cap (Diltiazem hcl er beads) .Marland Kitchen.. 1 by mouth once daily 4)  Alprazolam 0.25 Mg Tabs (Alprazolam) .Marland Kitchen.. 1 by mouth two times a day as needed 5)  Calcium 1200mg / Vit D 1000  .Marland KitchenMarland Kitchen. 1 by mouth once daily  Patient Instructions: 1)  Eat Boost or Ensure frozen. Prilosec OTC once daily in am. GI  consultation if dysphagia persists . Dental care as soon as stable.   Orders Added: 1)  Est. Patient Level III [60454] 2)  Venipuncture [09811] 3)  TLB-CBC Platelet - w/Differential [85025-CBCD] 4)  TLB-Creatinine, Blood [82565-CREA] 5)  TLB-Potassium (K+) [84132-K] 6)  TLB-BUN (Urea Nitrogen) [84520-BUN]  Appended Document: 4 week followup///sph

## 2010-08-07 NOTE — Procedures (Signed)
Summary: Colonoscopy  Patient: [name] Note: All result statuses are Final unless otherwise noted.  Tests: (1) Colonoscopy (COL)   COL Colonoscopy           DONE     [facility]     [address]     [place], [place]  [address]           COLONOSCOPY PROCEDURE REPORT           PATIENT:  Megan, Hendricks  MR#:  [id number]     BIRTHDATE:  May 01, 1927, 83 yrs. old  GENDER:  female     ENDOSCOPIST:  Rachael Fee, MD     PROCEDURE DATE:  06/16/2010     PROCEDURE:  Diagnostic Colonoscopy     ASA CLASS:  Class III     INDICATIONS:  microcytic anemia (heme neg)     MEDICATIONS:   Fentanyl 50 mcg IV, Versed 4 mg IV           DESCRIPTION OF PROCEDURE:   After the risks benefits and     alternatives of the procedure were thoroughly explained, informed     consent was obtained.  Digital rectal exam was performed and     revealed no rectal masses.   The [id number] ([id number]) and [id number]     ([id number]) endoscope was introduced through the anus and advanced     to the cecum, which was identified by the ileocecal valve, without     limitations.  The quality of the prep was adequate, using     MoviPrep.  The instrument was then slowly withdrawn as the colon     was fully examined.     <<PROCEDUREIMAGES>>     FINDINGS:  There was a malignant mass in cecum, adjacent to the IC     valve. The mass was 3cm, nearly circumferential, was ulcerated,     friable, clearly malignant (see image001).  The mass was not     biopsied (was quite difficult to reach the cecum to to long,     tortuous colon and unstable position of scope in ascending colon).     This was otherwise a normal examination of the colon (see     image002).  External hemorrhoids were found. These were medium to     large.   Retroflexed views in the rectum revealed no     abnormalities.    The scope was then withdrawn from the patient     and the procedure completed.     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     2) Medium to large hemorrhoids.           RECOMMENDATIONS:     I will set up staging workup (CEA, CT scan) and we will contact     general surgery.     This clearly explains her anemia, EGD canceled.           REPEAT EXAM:  1 year           ______________________________     Rachael Fee, MD           n.     eSIGNED:   Rachael Fee at 06/16/2010 11:47 AM           Pecola Leisure, Lucendia Herrlich, [phone]  Note: An exclamation mark (!) indicates a result that was not dispersed into the flowsheet. Document Creation Date: 06/16/2010 11:47 AM _______________________________________________________________________  (1) Order result status: Final Collection or observation date-time: 06/16/2010 11:26 Requested date-time:  Receipt date-time:  Reported date-time:  Referring Physician:   Ordering Physician: Rob Bunting [phone]) Specimen Source:  Source: Launa Grill Order Number: [id number] Lab site:   Appended Document: Colonoscopy patty, she needs recall colonoscoyp in 1 year  Appended Document: Colonoscopy recall in IDX and EMR   Clinical Lists Changes  Observations: Added new observation of COLONNXTDUE: 06/2011 (06/16/2010 12:37)

## 2010-08-07 NOTE — Consult Note (Signed)
Summary: [facility]  [place]   Imported By: Sherian Rein 06/19/2010 11:59:30  _____________________________________________________________________  External Attachment:    Type:   Image     Comment:   External Document

## 2010-08-07 NOTE — Progress Notes (Signed)
Summary: FYI--new temporary mailing address  Phone Note Call from Patient   Caller: DAUGHTER--[name] Summary of Call: Please mail labs and any future mail to daughter's house---mail to:  c/o [name] [address] [place], [place]  [address] Initial call taken by: Jerolyn Shin,  July 31, 2010 3:36 PM  Follow-up for Phone Call        Information changed in the system.  Follow-up by: Harold Barban,  July 31, 2010 3:53 PM

## 2010-08-07 NOTE — Progress Notes (Signed)
Summary: needs to talk to Dr Alwyn Ren  Phone Note Call from Patient   Caller: daughter [name] - [phone] Summary of Call: Daughter [name] called to report that patient is back in ICU because she is bleeding badly---has had blood transfusions and other additions ---daughter was upset about her mom and talked very quickly ----she said they are talking about a filter and she doesnt understand it and would like Dr Alwyn Ren to call her at the end of day to explain---she says she only trusts Dr Alwyn Ren and needs to get his opinion/ information on the "filter" and other procedures/ medications Initial call taken by: [name],  Hendricks 22, 2011 3:17 PM  Follow-up for Phone Call        I left message  stating IVC filter needed to prevent PTE as anticoagulation not an option with GI bleeding Follow-up by: Marga Melnick MD,  Hendricks 22, 2011 6:04 PM

## 2010-08-07 NOTE — Progress Notes (Signed)
Summary: alternate abx  Phone Note Call from Patient   Caller: Daughter Details for Reason: [name] Summary of Call: Patient daughter notes that she took her mom to see her colon cancer MD and he notes that the patient is not better. She was just seen so the patient daughter would like alternate ABX sent to pharmacy above. Please advise. Initial call taken by: Lucious Groves CMA,  July 14, 2010 2:19 PM  Follow-up for Phone Call        sse new Rx Follow-up by: Marga Melnick MD,  July 14, 2010 4:57 PM    New/Updated Medications: CEFUROXIME AXETIL 250 MG TABS (CEFUROXIME AXETIL) 1 two times a day Prescriptions: CEFUROXIME AXETIL 250 MG TABS (CEFUROXIME AXETIL) 1 two times a day  #14 x 0   Entered by:   Lucious Groves CMA   Authorized by:   Marga Melnick MD   Signed by:   Lucious Groves CMA on 07/14/2010   Method used:   Faxed to ...       [name] ([facility])       [address] [place], [place]  [address]       Ph: [phone]       Fax: [phone]   RxID:   [id number]

## 2010-08-07 NOTE — Progress Notes (Signed)
Summary: Lab Results  Phone Note Outgoing Call Call back at Home Phone [phone]   Call placed by: Shonna Chock CMA,  August 01, 2010 5:16 PM Call placed to: Patient Details for Reason: Lab Results  Summary of Call: Spoke with patient and informed of lab results Anemia has resolved;OK to restart warfarin  @ 2.5 mg daily  with PT/INR after 1 week  Chrae Malloy CMA  August 01, 2010 5:20 PM     New/Updated Medications: WARFARIN SODIUM 2.5 MG TABS (WARFARIN SODIUM) as directed based on PT/INR Reading Prescriptions: WARFARIN SODIUM 2.5 MG TABS (WARFARIN SODIUM) as directed based on PT/INR Reading  #90 x 0   Entered by:   Shonna Chock CMA   Authorized by:   Marga Melnick MD   Signed by:   Shonna Chock CMA on 08/01/2010   Method used:   Electronically to        [name] (retail)       [address] [place], [place]  [address]       Ph: [phone]       Fax: [phone]   RxID:   [id number]

## 2010-08-07 NOTE — Progress Notes (Signed)
Summary: patient is in lobby--needs 4 prescriptions  Phone Note Refill Request Message from:  Patient on July 31, 2010 3:39 PM  Refills Requested: Medication #1:  TRAMADOL HCL 50 MG TABS 1 every  6 hrs as needed pain  Medication #2:  FERREX 150 150 MG CAPS 1 by mouth once daily  Medication #3:  DILTIAZEM HCL ER BEADS 300 MG XR24H-CAP 1 by mouth once daily  Medication #4:  ALPRAZOLAM 0.25 MG TABS 1 by mouth two times a day as needed needs 30 day prescriptions--patient is waiting in lobby---forgot to ask for these paper copies of prescriptions because she took her last pills today  Initial call taken by: Jerolyn Shin,  July 31, 2010 3:39 PM    Prescriptions: FERREX 150 150 MG CAPS (POLYSACCHARIDE IRON COMPLEX) 1 by mouth once daily  #30 x 0   Entered by:   Shonna Chock CMA   Authorized by:   Marga Melnick MD   Signed by:   Shonna Chock CMA on 07/31/2010   Method used:   Print then Give to Patient   RxID:   [id number] DILTIAZEM HCL ER BEADS 300 MG XR24H-CAP (DILTIAZEM HCL ER BEADS) 1 by mouth once daily  #30 x 5   Entered by:   Shonna Chock CMA   Authorized by:   Marga Melnick MD   Signed by:   Shonna Chock CMA on 07/31/2010   Method used:   Print then Give to Patient   RxID:   [id number] ALPRAZOLAM 0.25 MG TABS (ALPRAZOLAM) 1 by mouth two times a day as needed  #60 x 5   Entered by:   Shonna Chock CMA   Authorized by:   Marga Melnick MD   Signed by:   Shonna Chock CMA on 07/31/2010   Method used:   Print then Give to Patient   RxID:   [id number] TRAMADOL HCL 50 MG TABS (TRAMADOL HCL) 1 every  6 hrs as needed pain  #30 x 2   Entered by:   Shonna Chock CMA   Authorized by:   Marga Melnick MD   Signed by:   Shonna Chock CMA on 07/31/2010   Method used:   Print then Give to Patient   RxID:   [id number]

## 2010-08-08 ENCOUNTER — Ambulatory Visit (INDEPENDENT_AMBULATORY_CARE_PROVIDER_SITE_OTHER): Payer: Medicare Other

## 2010-08-08 ENCOUNTER — Encounter: Payer: Self-pay | Admitting: Internal Medicine

## 2010-08-08 DIAGNOSIS — Z86718 Personal history of other venous thrombosis and embolism: Secondary | ICD-10-CM

## 2010-08-08 DIAGNOSIS — Z7901 Long term (current) use of anticoagulants: Secondary | ICD-10-CM

## 2010-08-08 DIAGNOSIS — Z5181 Encounter for therapeutic drug level monitoring: Secondary | ICD-10-CM

## 2010-08-08 LAB — CONVERTED CEMR LAB: INR: 1.3

## 2010-08-13 NOTE — Medication Information (Signed)
Summary: pt check///sph   Indication 1: Atrial fibrillation INR RANGE 2-3  Vital Signs: Weight: 180 lbs.  Pulse Rate: 68  Blood Pressure:  130 / 66            Allergies: No Known Drug Allergies  Anticoagulation Management History:      Positive risk factors for bleeding include an age of 75 years or older.  The bleeding index is 'intermediate risk'.  Positive CHADS2 values include History of HTN and Age > 75 years old.  Her last INR was 3.4 and today's INR is 1.3.    Anticoagulation Management Assessment/Plan:      The patient's current anticoagulation dose is Warfarin sodium 2.5 mg tabs: as directed based on PT/INR Reading.  The next INR is due 1 week.          VITAL SIGNS:  Patient Profile:   77 Years Old Female CC:      PT/INR Weight:      180 pounds BP sitting:   130 / 66  (left arm) Temp:     97.8 degrees F. Pulse rate:   68   Laboratory Results   Blood Tests      INR: 1.3   (Normal Range: 0.88-1.12   Therap INR: 2.0-3.5)      ANTICOAGULATION RECORD PREVIOUS REGIMEN & LAB RESULTS Anticoagulation Diagnosis:  Atrial fibrillation on  09/28/2008 Previous INR Goal Range:  2-3 on  12/16/2007 Previous INR:  3.4 on  10/18/2009 Previous Coumadin Dose(mg):  2.5 daily except 5 mg wed on  10/18/2009 Previous Regimen:  no change on  05/27/2009 Previous Coagulation Comments:  recheck 3 weeks  on  09/28/2008  NEW REGIMEN & LAB RESULTS Current INR: 1.3 Current Coumadin Dose(mg): (2.5mg ) 1 tab daily Regimen: 2 tabs (5mg ) today, sat,sun 1 tab all other days  Provider: Champ Keetch Repeat testing in: 1 week  Anticoagulation Visit Questionnaire Coumadin dose missed/changed:  No Abnormal Bleeding Symptoms:  No  Any diet changes including alcohol intake, vegetables or greens since the last visit:  No Any illnesses or hospitalizations since the last visit:  No Any signs of clotting since the last visit (including chest discomfort, dizziness, shortness of breath, arm  tingling, slurred speech, swelling or redness in leg):  No  MEDICATIONS TRAMADOL HCL 50 MG TABS (TRAMADOL HCL) 1 every  6 hrs as needed pain FERREX 150 150 MG CAPS (POLYSACCHARIDE IRON COMPLEX) 1 by mouth once daily DILTIAZEM HCL ER BEADS 300 MG XR24H-CAP (DILTIAZEM HCL ER BEADS) 1 by mouth once daily ALPRAZOLAM 0.25 MG TABS (ALPRAZOLAM) 1 by mouth two times a day as needed * CALCIUM 1200MG / VIT D 1000 1 by mouth once daily WARFARIN SODIUM 2.5 MG TABS (WARFARIN SODIUM) as directed based on PT/INR Reading

## 2010-08-13 NOTE — Progress Notes (Signed)
Summary: needs pt visit----lmom 1/30--has appt  ---- Converted from flag ---- ---- 08/01/2010 5:19 PM, Shonna Chock CMA wrote: Please call paitent to schedule appointment for 1 week nurse vist coumadin check ------------------------------       Additional Follow-up for Phone Call Additional follow up Details #2::    lmom to call back and schedule a nurse visit coumidin check for end of this week or beginning of next week.Marland KitchenMarland KitchenJerolyn Shin  August 04, 2010 9:28 AM  Jasmine December returned call---patient has appt for Fri 2/3 at 3:45 Follow-up by: Jerolyn Shin,  August 04, 2010 10:21 AM

## 2010-08-13 NOTE — Letter (Signed)
Summary: [facility]   Imported By: Lanelle Bal 08/04/2010 08:46:54  _____________________________________________________________________  External Attachment:    Type:   Image     Comment:   External Document

## 2010-08-14 ENCOUNTER — Encounter: Payer: Self-pay | Admitting: Internal Medicine

## 2010-08-14 ENCOUNTER — Ambulatory Visit (INDEPENDENT_AMBULATORY_CARE_PROVIDER_SITE_OTHER): Payer: Medicare Other

## 2010-08-14 DIAGNOSIS — Z5181 Encounter for therapeutic drug level monitoring: Secondary | ICD-10-CM

## 2010-08-14 DIAGNOSIS — Z7901 Long term (current) use of anticoagulants: Secondary | ICD-10-CM

## 2010-08-26 ENCOUNTER — Ambulatory Visit (INDEPENDENT_AMBULATORY_CARE_PROVIDER_SITE_OTHER): Payer: Medicare Other

## 2010-08-26 ENCOUNTER — Encounter: Payer: Self-pay | Admitting: Internal Medicine

## 2010-08-26 DIAGNOSIS — Z7901 Long term (current) use of anticoagulants: Secondary | ICD-10-CM

## 2010-08-26 DIAGNOSIS — Z86718 Personal history of other venous thrombosis and embolism: Secondary | ICD-10-CM

## 2010-08-26 DIAGNOSIS — Z5181 Encounter for therapeutic drug level monitoring: Secondary | ICD-10-CM

## 2010-08-27 ENCOUNTER — Telehealth (INDEPENDENT_AMBULATORY_CARE_PROVIDER_SITE_OTHER): Payer: Self-pay | Admitting: *Deleted

## 2010-08-27 NOTE — Letter (Signed)
Summary: [facility]  [facility]   Imported By: Maryln Gottron 08/20/2010 09:35:07  _____________________________________________________________________  External Attachment:    Type:   Image     Comment:   External Document

## 2010-09-02 NOTE — Medication Information (Signed)
Summary: pt check///sph   Indication 1: Atrial fibrillation INR RANGE 2-3  Vital Signs: Weight: 186 lbs.  Pulse Rate: 72  Blood Pressure:  122 / 72            Current Medications (verified): 1)  Tramadol Hcl 50 Mg Tabs (Tramadol Hcl) .Marland Kitchen.. 1 Every  6 Hrs As Needed Pain 2)  Ferrex 150 150 Mg Caps (Polysaccharide Iron Complex) .Marland Kitchen.. 1 By Mouth Once Daily 3)  Diltiazem Hcl Er Beads 300 Mg Xr24h-Cap (Diltiazem Hcl Er Beads) .Marland Kitchen.. 1 By Mouth Once Daily 4)  Alprazolam 0.25 Mg Tabs (Alprazolam) .Marland Kitchen.. 1 By Mouth Two Times A Day As Needed 5)  Calcium 1200mg / Vit D 1000 .Marland Kitchen.. 1 By Mouth Once Daily 6)  Warfarin Sodium 2.5 Mg Tabs (Warfarin Sodium) .... As Directed Based On Pt/inr Reading  Allergies (verified): No Known Drug Allergies  Vital Signs:  Patient profile:   77 year old female Weight:      186 pounds Temp:     97.7 degrees F oral Pulse rate:   72 / minute BP sitting:   122 / 72  (left arm)  Vitals Entered By: Jeremy Johann CMA (August 26, 2010 4:20 PM)  CC: PT/INR   Patient Instructions: 1)  Please schedule a follow-up appointment in 10 days. 2)  New Regimen:Hold today then 5mg  daily except 2.5 mg on wed   Anticoagulation Management History:      Positive risk factors for bleeding include an age of 19 years or older.  The bleeding index is 'intermediate risk'.  Positive CHADS2 values include History of HTN and Age > 5 years old.  Her last INR was 1.5 and today's INR is 4.0.    Anticoagulation Management Assessment/Plan:      The patient's current anticoagulation dose is Warfarin sodium 2.5 mg tabs: as directed based on PT/INR Reading.  The next INR is due 10 days.        Laboratory Results   Blood Tests      INR: 4.0   (Normal Range: 0.88-1.12   Therap INR: 2.0-3.5)       ANTICOAGULATION RECORD PREVIOUS REGIMEN & LAB RESULTS Anticoagulation Diagnosis:  Atrial fibrillation on  09/28/2008 Previous INR Goal Range:  2-3 on  12/16/2007 Previous INR:  1.5  on  08/14/2010 Previous Coumadin Dose(mg):  2.5mg  daily except 5mg  sun,sat on  08/14/2010 Previous Regimen:  Take 2 tabs(5mg ) today then resume 5mg  daily, except 7.5 mg  on wed on  08/14/2010 Previous Coagulation Comments:  recheck 3 weeks  on  09/28/2008  NEW REGIMEN & LAB RESULTS Current INR: 4.0 Current Coumadin Dose(mg): Take 2 tabs(5mg ) today then resume 5mg  daily, except 7.5 mg  on wed Regimen: Hold today then 5mg  daily except 2.5 mg on wed  Provider: Marlet Korte Repeat testing in: 10 days  Anticoagulation Visit Questionnaire Coumadin dose missed/changed:  No Abnormal Bleeding Symptoms:  No  Any diet changes including alcohol intake, vegetables or greens since the last visit:  No Any illnesses or hospitalizations since the last visit:  No Any signs of clotting since the last visit (including chest discomfort, dizziness, shortness of breath, arm tingling, slurred speech, swelling or redness in leg):  No  MEDICATIONS TRAMADOL HCL 50 MG TABS (TRAMADOL HCL) 1 every  6 hrs as needed pain FERREX 150 150 MG CAPS (POLYSACCHARIDE IRON COMPLEX) 1 by mouth once daily DILTIAZEM HCL ER BEADS 300 MG XR24H-CAP (DILTIAZEM HCL ER BEADS) 1 by mouth once daily ALPRAZOLAM 0.25 MG TABS (  ALPRAZOLAM) 1 by mouth two times a day as needed * CALCIUM 1200MG / VIT D 1000 1 by mouth once daily WARFARIN SODIUM 2.5 MG TABS (WARFARIN SODIUM) as directed based on PT/INR Reading

## 2010-09-02 NOTE — Progress Notes (Signed)
Summary: Refill or not   Phone Note Refill Request Message from:  daughter on August 27, 2010 8:54 AM  Refills Requested: Medication #1:  FERREX 150 150 MG CAPS 1 by mouth once daily [facility] --- patient wants to know if she should refill - she thought dr hopper told her she may be able to stop taking this       -----sharons [phone]  Initial call taken by: Okey Regal [place],  August 27, 2010 8:57 AM  Follow-up for Phone Call        Dr.Hopper please advise if patient is to continue med, if yes forward back to me and I will refill  Follow-up by: Shonna Chock CMA,  August 27, 2010 8:58 AM  Additional Follow-up for Phone Call Additional follow up Details #1::        can be stopped after present Rx; recheck CBC in  4-6 weeks after it is stopped ; can be done with PT/INR  @ that time  ( 285.9) Additional Follow-up by: Marga Melnick MD,  August 27, 2010 11:22 AM    Additional Follow-up for Phone Call Additional follow up Details #2::    Spoke with [name], patient's daughter. Informed her of Dr.Hopper's response. [name] will make note to call and schedule CBCD in 4-6 weeks Follow-up by: Shonna Chock CMA,  August 27, 2010 11:33 AM

## 2010-09-05 ENCOUNTER — Ambulatory Visit: Payer: Medicare Other

## 2010-09-09 ENCOUNTER — Encounter: Payer: Self-pay | Admitting: Internal Medicine

## 2010-09-09 ENCOUNTER — Ambulatory Visit (INDEPENDENT_AMBULATORY_CARE_PROVIDER_SITE_OTHER): Payer: Medicare Other

## 2010-09-09 DIAGNOSIS — Z5181 Encounter for therapeutic drug level monitoring: Secondary | ICD-10-CM

## 2010-09-09 DIAGNOSIS — Z7901 Long term (current) use of anticoagulants: Secondary | ICD-10-CM

## 2010-09-09 DIAGNOSIS — Z86718 Personal history of other venous thrombosis and embolism: Secondary | ICD-10-CM

## 2010-09-15 LAB — CBC
HCT: 24 % — ABNORMAL LOW (ref 36.0–46.0)
HCT: 25.5 % — ABNORMAL LOW (ref 36.0–46.0)
HCT: 26.6 % — ABNORMAL LOW (ref 36.0–46.0)
HCT: 27.1 % — ABNORMAL LOW (ref 36.0–46.0)
HCT: 27.2 % — ABNORMAL LOW (ref 36.0–46.0)
HCT: 27.3 % — ABNORMAL LOW (ref 36.0–46.0)
HCT: 27.5 % — ABNORMAL LOW (ref 36.0–46.0)
HCT: 28.3 % — ABNORMAL LOW (ref 36.0–46.0)
HCT: 29.4 % — ABNORMAL LOW (ref 36.0–46.0)
HCT: 29.7 % — ABNORMAL LOW (ref 36.0–46.0)
HCT: 30.4 % — ABNORMAL LOW (ref 36.0–46.0)
HCT: 30.5 % — ABNORMAL LOW (ref 36.0–46.0)
HCT: 30.9 % — ABNORMAL LOW (ref 36.0–46.0)
HCT: 32.7 % — ABNORMAL LOW (ref 36.0–46.0)
HCT: 34.6 % — ABNORMAL LOW (ref 36.0–46.0)
HCT: 21.6 % — ABNORMAL LOW (ref 36.0–46.0)
HCT: 21.8 % — ABNORMAL LOW (ref 36.0–46.0)
HCT: 27.5 % — ABNORMAL LOW (ref 36.0–46.0)
HCT: 28.7 % — ABNORMAL LOW (ref 36.0–46.0)
Hemoglobin: 10 g/dL — ABNORMAL LOW (ref 12.0–15.0)
Hemoglobin: 10.1 g/dL — ABNORMAL LOW (ref 12.0–15.0)
Hemoglobin: 10.4 g/dL — ABNORMAL LOW (ref 12.0–15.0)
Hemoglobin: 10.5 g/dL — ABNORMAL LOW (ref 12.0–15.0)
Hemoglobin: 7.6 g/dL — ABNORMAL LOW (ref 12.0–15.0)
Hemoglobin: 7.7 g/dL — ABNORMAL LOW (ref 12.0–15.0)
Hemoglobin: 7.8 g/dL — ABNORMAL LOW (ref 12.0–15.0)
Hemoglobin: 8.9 g/dL — ABNORMAL LOW (ref 12.0–15.0)
Hemoglobin: 9 g/dL — ABNORMAL LOW (ref 12.0–15.0)
Hemoglobin: 9.4 g/dL — ABNORMAL LOW (ref 12.0–15.0)
Hemoglobin: 9.5 g/dL — ABNORMAL LOW (ref 12.0–15.0)
Hemoglobin: 9.8 g/dL — ABNORMAL LOW (ref 12.0–15.0)
Hemoglobin: 5.8 g/dL — CL (ref 12.0–15.0)
Hemoglobin: 6.1 g/dL — CL (ref 12.0–15.0)
Hemoglobin: 7.7 g/dL — ABNORMAL LOW (ref 12.0–15.0)
Hemoglobin: 8.1 g/dL — ABNORMAL LOW (ref 12.0–15.0)
Hemoglobin: 8.3 g/dL — ABNORMAL LOW (ref 12.0–15.0)
Hemoglobin: 8.7 g/dL — ABNORMAL LOW (ref 12.0–15.0)
MCH: 21.5 pg — ABNORMAL LOW (ref 26.0–34.0)
MCH: 21.7 pg — ABNORMAL LOW (ref 26.0–34.0)
MCH: 23.3 pg — ABNORMAL LOW (ref 26.0–34.0)
MCH: 26.5 pg (ref 26.0–34.0)
MCH: 26.8 pg (ref 26.0–34.0)
MCH: 27.1 pg (ref 26.0–34.0)
MCH: 27.3 pg (ref 26.0–34.0)
MCH: 16.5 pg — ABNORMAL LOW (ref 26.0–34.0)
MCH: 17.9 pg — ABNORMAL LOW (ref 26.0–34.0)
MCH: 19.9 pg — ABNORMAL LOW (ref 26.0–34.0)
MCH: 20.1 pg — ABNORMAL LOW (ref 26.0–34.0)
MCH: 21.1 pg — ABNORMAL LOW (ref 26.0–34.0)
MCH: 21.1 pg — ABNORMAL LOW (ref 26.0–34.0)
MCHC: 29.1 g/dL — ABNORMAL LOW (ref 30.0–36.0)
MCHC: 29.3 g/dL — ABNORMAL LOW (ref 30.0–36.0)
MCHC: 29.5 g/dL — ABNORMAL LOW (ref 30.0–36.0)
MCHC: 29.5 g/dL — ABNORMAL LOW (ref 30.0–36.0)
MCHC: 31.6 g/dL (ref 30.0–36.0)
MCHC: 31.7 g/dL (ref 30.0–36.0)
MCHC: 32.1 g/dL (ref 30.0–36.0)
MCHC: 32.1 g/dL (ref 30.0–36.0)
MCHC: 32.5 g/dL (ref 30.0–36.0)
MCHC: 32.5 g/dL (ref 30.0–36.0)
MCHC: 33 g/dL (ref 30.0–36.0)
MCHC: 26.9 g/dL — ABNORMAL LOW (ref 30.0–36.0)
MCHC: 28 g/dL — ABNORMAL LOW (ref 30.0–36.0)
MCHC: 29.5 g/dL — ABNORMAL LOW (ref 30.0–36.0)
MCHC: 30 g/dL (ref 30.0–36.0)
MCV: 73.5 fL — ABNORMAL LOW (ref 78.0–100.0)
MCV: 73.6 fL — ABNORMAL LOW (ref 78.0–100.0)
MCV: 74.7 fL — ABNORMAL LOW (ref 78.0–100.0)
MCV: 79.7 fL (ref 78.0–100.0)
MCV: 81.2 fL (ref 78.0–100.0)
MCV: 81.6 fL (ref 78.0–100.0)
MCV: 82.8 fL (ref 78.0–100.0)
MCV: 84 fL (ref 78.0–100.0)
MCV: 84.9 fL (ref 78.0–100.0)
MCV: 85 fL (ref 78.0–100.0)
MCV: 68.2 fL — ABNORMAL LOW (ref 78.0–100.0)
MCV: 70.2 fL — ABNORMAL LOW (ref 78.0–100.0)
MCV: 71.2 fL — ABNORMAL LOW (ref 78.0–100.0)
MCV: 73 fL — ABNORMAL LOW (ref 78.0–100.0)
MCV: 73.1 fL — ABNORMAL LOW (ref 78.0–100.0)
Platelets: 111 10*3/uL — ABNORMAL LOW (ref 150–400)
Platelets: 123 10*3/uL — ABNORMAL LOW (ref 150–400)
Platelets: 134 10*3/uL — ABNORMAL LOW (ref 150–400)
Platelets: 160 10*3/uL (ref 150–400)
Platelets: 163 10*3/uL (ref 150–400)
Platelets: 170 10*3/uL (ref 150–400)
Platelets: 175 10*3/uL (ref 150–400)
Platelets: 186 10*3/uL (ref 150–400)
Platelets: 134 10*3/uL — ABNORMAL LOW (ref 150–400)
Platelets: 138 10*3/uL — ABNORMAL LOW (ref 150–400)
Platelets: 140 10*3/uL — ABNORMAL LOW (ref 150–400)
Platelets: 158 10*3/uL (ref 150–400)
Platelets: 173 10*3/uL (ref 150–400)
RBC: 2.66 MIL/uL — ABNORMAL LOW (ref 3.87–5.11)
RBC: 3.26 MIL/uL — ABNORMAL LOW (ref 3.87–5.11)
RBC: 3.5 MIL/uL — ABNORMAL LOW (ref 3.87–5.11)
RBC: 3.53 MIL/uL — ABNORMAL LOW (ref 3.87–5.11)
RBC: 3.62 MIL/uL — ABNORMAL LOW (ref 3.87–5.11)
RBC: 3.73 MIL/uL — ABNORMAL LOW (ref 3.87–5.11)
RBC: 3.77 MIL/uL — ABNORMAL LOW (ref 3.87–5.11)
RBC: 3.9 MIL/uL (ref 3.87–5.11)
RBC: 4.07 MIL/uL (ref 3.87–5.11)
RBC: 4.07 MIL/uL (ref 3.87–5.11)
RBC: 4.09 MIL/uL (ref 3.87–5.11)
RBC: 3.87 MIL/uL (ref 3.87–5.11)
RBC: 3.93 MIL/uL (ref 3.87–5.11)
RBC: 4.13 MIL/uL (ref 3.87–5.11)
RBC: 4.24 MIL/uL (ref 3.87–5.11)
RDW: 23.1 % — ABNORMAL HIGH (ref 11.5–15.5)
RDW: 24.7 % — ABNORMAL HIGH (ref 11.5–15.5)
RDW: 27.7 % — ABNORMAL HIGH (ref 11.5–15.5)
RDW: 28 % — ABNORMAL HIGH (ref 11.5–15.5)
RDW: 28 % — ABNORMAL HIGH (ref 11.5–15.5)
RDW: 28.6 % — ABNORMAL HIGH (ref 11.5–15.5)
RDW: 29.3 % — ABNORMAL HIGH (ref 11.5–15.5)
RDW: 30.3 % — ABNORMAL HIGH (ref 11.5–15.5)
RDW: 21.2 % — ABNORMAL HIGH (ref 11.5–15.5)
RDW: 23.2 % — ABNORMAL HIGH (ref 11.5–15.5)
RDW: 25.7 % — ABNORMAL HIGH (ref 11.5–15.5)
RDW: 26.2 % — ABNORMAL HIGH (ref 11.5–15.5)
WBC: 5.2 10*3/uL (ref 4.0–10.5)
WBC: 6.5 10*3/uL (ref 4.0–10.5)
WBC: 6.5 10*3/uL (ref 4.0–10.5)
WBC: 6.9 10*3/uL (ref 4.0–10.5)
WBC: 7.5 10*3/uL (ref 4.0–10.5)
WBC: 7.6 10*3/uL (ref 4.0–10.5)
WBC: 7.6 10*3/uL (ref 4.0–10.5)
WBC: 7.6 10*3/uL (ref 4.0–10.5)
WBC: 7.7 10*3/uL (ref 4.0–10.5)
WBC: 7.8 10*3/uL (ref 4.0–10.5)
WBC: 8 10*3/uL (ref 4.0–10.5)
WBC: 8.7 10*3/uL (ref 4.0–10.5)
WBC: 8.9 10*3/uL (ref 4.0–10.5)
WBC: 9.6 10*3/uL (ref 4.0–10.5)
WBC: 9.7 10*3/uL (ref 4.0–10.5)
WBC: 13.8 10*3/uL — ABNORMAL HIGH (ref 4.0–10.5)
WBC: 17.1 10*3/uL — ABNORMAL HIGH (ref 4.0–10.5)
WBC: 4.8 10*3/uL (ref 4.0–10.5)
WBC: 6.6 10*3/uL (ref 4.0–10.5)

## 2010-09-15 LAB — BASIC METABOLIC PANEL
BUN: 11 mg/dL (ref 6–23)
BUN: 12 mg/dL (ref 6–23)
BUN: 13 mg/dL (ref 6–23)
BUN: 17 mg/dL (ref 6–23)
BUN: 6 mg/dL (ref 6–23)
BUN: 18 mg/dL (ref 6–23)
BUN: 21 mg/dL (ref 6–23)
CO2: 20 mEq/L (ref 19–32)
CO2: 26 mEq/L (ref 19–32)
CO2: 22 mEq/L (ref 19–32)
CO2: 22 mEq/L (ref 19–32)
CO2: 23 mEq/L (ref 19–32)
Calcium: 7.8 mg/dL — ABNORMAL LOW (ref 8.4–10.5)
Calcium: 8.5 mg/dL (ref 8.4–10.5)
Calcium: 8.6 mg/dL (ref 8.4–10.5)
Calcium: 8 mg/dL — ABNORMAL LOW (ref 8.4–10.5)
Calcium: 8.2 mg/dL — ABNORMAL LOW (ref 8.4–10.5)
Calcium: 8.3 mg/dL — ABNORMAL LOW (ref 8.4–10.5)
Chloride: 110 mEq/L (ref 96–112)
Chloride: 112 mEq/L (ref 96–112)
Chloride: 112 mEq/L (ref 96–112)
Chloride: 113 mEq/L — ABNORMAL HIGH (ref 96–112)
Chloride: 116 mEq/L — ABNORMAL HIGH (ref 96–112)
Chloride: 111 mEq/L (ref 96–112)
Chloride: 112 mEq/L (ref 96–112)
Chloride: 113 mEq/L — ABNORMAL HIGH (ref 96–112)
Chloride: 113 mEq/L — ABNORMAL HIGH (ref 96–112)
Chloride: 113 mEq/L — ABNORMAL HIGH (ref 96–112)
Creatinine, Ser: 1.15 mg/dL (ref 0.4–1.2)
Creatinine, Ser: 1.23 mg/dL — ABNORMAL HIGH (ref 0.4–1.2)
Creatinine, Ser: 1.39 mg/dL — ABNORMAL HIGH (ref 0.4–1.2)
Creatinine, Ser: 1.54 mg/dL — ABNORMAL HIGH (ref 0.4–1.2)
Creatinine, Ser: 1.59 mg/dL — ABNORMAL HIGH (ref 0.4–1.2)
Creatinine, Ser: 1.62 mg/dL — ABNORMAL HIGH (ref 0.4–1.2)
Creatinine, Ser: 1.64 mg/dL — ABNORMAL HIGH (ref 0.4–1.2)
Creatinine, Ser: 1.83 mg/dL — ABNORMAL HIGH (ref 0.4–1.2)
GFR calc Af Amer: 39 mL/min — ABNORMAL LOW (ref >60)
GFR calc Af Amer: 51 mL/min — ABNORMAL LOW (ref >60)
GFR calc Af Amer: 51 mL/min — ABNORMAL LOW (ref >60)
GFR calc Af Amer: 55 mL/min — ABNORMAL LOW (ref >60)
GFR calc Af Amer: 55 mL/min — ABNORMAL LOW (ref >60)
GFR calc Af Amer: 32 mL/min — ABNORMAL LOW (ref >60)
GFR calc Af Amer: 39 mL/min — ABNORMAL LOW (ref >60)
GFR calc Af Amer: 39 mL/min — ABNORMAL LOW (ref >60)
GFR calc non Af Amer: 36 mL/min — ABNORMAL LOW (ref >60)
GFR calc non Af Amer: 39 mL/min — ABNORMAL LOW (ref >60)
GFR calc non Af Amer: 42 mL/min — ABNORMAL LOW (ref >60)
GFR calc non Af Amer: 45 mL/min — ABNORMAL LOW (ref >60)
GFR calc non Af Amer: 49 mL/min — ABNORMAL LOW (ref >60)
GFR calc non Af Amer: 30 mL/min — ABNORMAL LOW (ref >60)
GFR calc non Af Amer: 31 mL/min — ABNORMAL LOW (ref >60)
Glucose, Bld: 106 mg/dL — ABNORMAL HIGH (ref 70–99)
Glucose, Bld: 108 mg/dL — ABNORMAL HIGH (ref 70–99)
Glucose, Bld: 112 mg/dL — ABNORMAL HIGH (ref 70–99)
Glucose, Bld: 93 mg/dL (ref 70–99)
Glucose, Bld: 95 mg/dL (ref 70–99)
Glucose, Bld: 118 mg/dL — ABNORMAL HIGH (ref 70–99)
Glucose, Bld: 95 mg/dL (ref 70–99)
Potassium: 3.2 mEq/L — ABNORMAL LOW (ref 3.5–5.1)
Potassium: 3.6 mEq/L (ref 3.5–5.1)
Potassium: 3.8 mEq/L (ref 3.5–5.1)
Potassium: 3.9 mEq/L (ref 3.5–5.1)
Potassium: 3.9 mEq/L (ref 3.5–5.1)
Potassium: 4.1 mEq/L (ref 3.5–5.1)
Potassium: 4.2 mEq/L (ref 3.5–5.1)
Potassium: 4.1 mEq/L (ref 3.5–5.1)
Potassium: 4.4 mEq/L (ref 3.5–5.1)
Sodium: 139 mEq/L (ref 135–145)
Sodium: 142 mEq/L (ref 135–145)
Sodium: 142 mEq/L (ref 135–145)
Sodium: 143 mEq/L (ref 135–145)
Sodium: 143 mEq/L (ref 135–145)
Sodium: 143 mEq/L (ref 135–145)

## 2010-09-15 LAB — CROSSMATCH
ABO/RH(D): A POS
Antibody Screen: NEGATIVE
Antibody Screen: NEGATIVE
Unit division: 0
Unit division: 0
Unit division: 0
Unit division: 0
Unit division: 0

## 2010-09-15 LAB — COMPREHENSIVE METABOLIC PANEL
ALT: 11 U/L (ref 0–35)
AST: 16 U/L (ref 0–37)
Albumin: 2.8 g/dL — ABNORMAL LOW (ref 3.5–5.2)
Albumin: 3.6 g/dL (ref 3.5–5.2)
Alkaline Phosphatase: 28 U/L — ABNORMAL LOW (ref 39–117)
Alkaline Phosphatase: 35 U/L — ABNORMAL LOW (ref 39–117)
Alkaline Phosphatase: 33 U/L — ABNORMAL LOW (ref 39–117)
BUN: 10 mg/dL (ref 6–23)
BUN: 8 mg/dL (ref 6–23)
BUN: 22 mg/dL (ref 6–23)
CO2: 25 mEq/L (ref 19–32)
Calcium: 7.8 mg/dL — ABNORMAL LOW (ref 8.4–10.5)
Calcium: 8.1 mg/dL — ABNORMAL LOW (ref 8.4–10.5)
Chloride: 112 mEq/L (ref 96–112)
Creatinine, Ser: 1.28 mg/dL — ABNORMAL HIGH (ref 0.4–1.2)
Creatinine, Ser: 1.76 mg/dL — ABNORMAL HIGH (ref 0.4–1.2)
GFR calc Af Amer: 34 mL/min — ABNORMAL LOW (ref >60)
GFR calc non Af Amer: 42 mL/min — ABNORMAL LOW (ref >60)
Glucose, Bld: 100 mg/dL — ABNORMAL HIGH (ref 70–99)
Glucose, Bld: 106 mg/dL — ABNORMAL HIGH (ref 70–99)
Potassium: 3.8 mEq/L (ref 3.5–5.1)
Potassium: 3.9 mEq/L (ref 3.5–5.1)
Sodium: 145 mEq/L (ref 135–145)
Total Protein: 4.4 g/dL — ABNORMAL LOW (ref 6.0–8.3)
Total Protein: 4.8 g/dL — ABNORMAL LOW (ref 6.0–8.3)
Total Protein: 5.9 g/dL — ABNORMAL LOW (ref 6.0–8.3)

## 2010-09-15 LAB — MAGNESIUM
Magnesium: 1.4 mg/dL — ABNORMAL LOW (ref 1.5–2.5)
Magnesium: 1.7 mg/dL (ref 1.5–2.5)

## 2010-09-15 LAB — DIFFERENTIAL
Basophils Relative: 1 % (ref 0–1)
Eosinophils Relative: 6 % — ABNORMAL HIGH (ref 0–5)
Lymphs Abs: 0.9 10*3/uL (ref 0.7–4.0)
Monocytes Absolute: 0.5 10*3/uL (ref 0.1–1.0)
Monocytes Relative: 12 % (ref 3–12)

## 2010-09-15 LAB — PREPARE RBC (CROSSMATCH)

## 2010-09-15 LAB — PROTIME-INR
INR: 1.13 (ref 0.00–1.49)
INR: 1.19 (ref 0.00–1.49)
INR: 1.25 (ref 0.00–1.49)
INR: 1.35 (ref 0.00–1.49)
INR: 1.67 — ABNORMAL HIGH (ref 0.00–1.49)
INR: 2.06 — ABNORMAL HIGH (ref 0.00–1.49)
INR: 2.24 — ABNORMAL HIGH (ref 0.00–1.49)
Prothrombin Time: 15.3 seconds — ABNORMAL HIGH (ref 11.6–15.2)
Prothrombin Time: 16.3 seconds — ABNORMAL HIGH (ref 11.6–15.2)
Prothrombin Time: 19.9 seconds — ABNORMAL HIGH (ref 11.6–15.2)
Prothrombin Time: 24.9 seconds — ABNORMAL HIGH (ref 11.6–15.2)
Prothrombin Time: 27.6 seconds — ABNORMAL HIGH (ref 11.6–15.2)
Prothrombin Time: 15.8 seconds — ABNORMAL HIGH (ref 11.6–15.2)

## 2010-09-15 LAB — IRON AND TIBC
Iron: 25 ug/dL — ABNORMAL LOW (ref 42–135)
Iron: 25 ug/dL — ABNORMAL LOW (ref 42–135)
Saturation Ratios: 6 % — ABNORMAL LOW (ref 20–55)
TIBC: 413 ug/dL (ref 250–470)
UIBC: 388 ug/dL
UIBC: 426 ug/dL

## 2010-09-15 LAB — POCT I-STAT, CHEM 8
BUN: 27 mg/dL — ABNORMAL HIGH (ref 6–23)
Chloride: 109 mEq/L (ref 96–112)
Creatinine, Ser: 1.5 mg/dL — ABNORMAL HIGH (ref 0.4–1.2)
Glucose, Bld: 114 mg/dL — ABNORMAL HIGH (ref 70–99)
HCT: 22 % — ABNORMAL LOW (ref 36.0–46.0)
Hemoglobin: 7.5 g/dL — ABNORMAL LOW (ref 12.0–15.0)
Potassium: 4 mEq/L (ref 3.5–5.1)
Sodium: 143 mEq/L (ref 135–145)

## 2010-09-15 LAB — POCT CARDIAC MARKERS
CKMB, poc: <1 ng/mL — ABNORMAL LOW (ref 1.0–8.0)
CKMB, poc: <1 ng/mL — ABNORMAL LOW (ref 1.0–8.0)
Myoglobin, poc: 91.3 ng/mL (ref 12–200)
Myoglobin, poc: 97.8 ng/mL (ref 12–200)
Troponin i, poc: <0.05 ng/mL (ref 0.00–0.09)
Troponin i, poc: <0.05 ng/mL (ref 0.00–0.09)

## 2010-09-15 LAB — LIPID PANEL
LDL Cholesterol: 66 mg/dL (ref 0–99)
Total CHOL/HDL Ratio: 4.2 RATIO
Triglycerides: 54 mg/dL (ref <150)
VLDL: 11 mg/dL (ref 0–40)

## 2010-09-15 LAB — BRAIN NATRIURETIC PEPTIDE: Pro B Natriuretic peptide (BNP): 831 pg/mL — ABNORMAL HIGH (ref 0.0–100.0)

## 2010-09-15 LAB — CARDIAC PANEL(CRET KIN+CKTOT+MB+TROPI)
CK, MB: 1.2 ng/mL (ref 0.3–4.0)
CK, MB: 1.2 ng/mL (ref 0.3–4.0)
Total CK: 53 U/L (ref 7–177)
Troponin I: 0.02 ng/mL (ref 0.00–0.06)

## 2010-09-15 LAB — PREPARE FRESH FROZEN PLASMA

## 2010-09-15 LAB — FERRITIN
Ferritin: 6 ng/mL — ABNORMAL LOW (ref 10–291)
Ferritin: 7 ng/mL — ABNORMAL LOW (ref 10–291)
Ferritin: 9 ng/mL — ABNORMAL LOW (ref 10–291)

## 2010-09-15 LAB — TYPE AND SCREEN
ABO/RH(D): A POS
Antibody Screen: NEGATIVE
Unit division: 0
Unit division: 0

## 2010-09-15 LAB — URINALYSIS, ROUTINE W REFLEX MICROSCOPIC
Bilirubin Urine: NEGATIVE
Hgb urine dipstick: NEGATIVE
Ketones, ur: NEGATIVE mg/dL
Nitrite: NEGATIVE
Specific Gravity, Urine: 1.011 (ref 1.005–1.030)
Urobilinogen, UA: 0.2 mg/dL (ref 0.0–1.0)

## 2010-09-15 LAB — TSH: TSH: 2.206 u[IU]/mL (ref 0.350–4.500)

## 2010-09-15 LAB — VITAMIN B12
Vitamin B-12: 234 pg/mL (ref 211–911)
Vitamin B-12: 262 pg/mL (ref 211–911)

## 2010-09-15 LAB — APTT
aPTT: 38 seconds — ABNORMAL HIGH (ref 24–37)
aPTT: 51 seconds — ABNORMAL HIGH (ref 24–37)

## 2010-09-15 LAB — POCT I-STAT 4, (NA,K, GLUC, HGB,HCT)
Hemoglobin: 8.8 g/dL — ABNORMAL LOW (ref 12.0–15.0)
Potassium: 4.4 mEq/L (ref 3.5–5.1)

## 2010-09-15 LAB — HEPARIN LEVEL (UNFRACTIONATED): Heparin Unfractionated: 0.3 IU/mL (ref 0.30–0.70)

## 2010-09-15 LAB — HEMOGLOBIN AND HEMATOCRIT, BLOOD
HCT: 20.4 % — ABNORMAL LOW (ref 36.0–46.0)
Hemoglobin: 6.1 g/dL — CL (ref 12.0–15.0)
Hemoglobin: 9.5 g/dL — ABNORMAL LOW (ref 12.0–15.0)

## 2010-09-15 LAB — CEA: CEA: 0.8 ng/mL (ref 0.0–5.0)

## 2010-09-15 LAB — HEMOCCULT GUIAC POC 1CARD (OFFICE): Fecal Occult Bld: NEGATIVE

## 2010-09-15 LAB — FOLATE: Folate: 11.1 ng/mL

## 2010-09-15 LAB — ABO/RH: ABO/RH(D): A POS

## 2010-09-16 NOTE — Medication Information (Signed)
Summary: pt/cbs  Anticoagulant Therapy  Managed by: Marga Melnick, MD Indication 1: Atrial fibrillation INR POC 3.8 INR RANGE 2-3  Vital Signs: Weight: 183 lbs.  Blood Pressure:  120 / 60   Dietary changes: no    Health status changes: no    Bleeding/hemorrhagic complications: no    Recent/future hospitalizations: no    Any changes in medication regimen? no    Recent/future dental: no  Any missed doses?: no       Is patient compliant with meds? yes      Comments: current dose- held dose on 2/21 then 5mg  daily except 2.5mg  on wed.  NEW REGIMEN- 2.5mg  today 5mg  daily except 2.5mg  M & F recheck in 2 weeks ....Marland KitchenMarland KitchenDoristine Devoid CMA  September 09, 2010 3:23 PM   Allergies: No Known Drug Allergies  Anticoagulation Management History:      Positive risk factors for bleeding include an age of 75 years or older.  The bleeding index is 'intermediate risk'.  Positive CHADS2 values include History of HTN and Age > 75 years old.  Her last INR was 4.0.  INR POC: 3.8.    Anticoagulation Management Assessment/Plan:      The patient's current anticoagulation dose is Warfarin sodium 2.5 mg tabs: as directed based on PT/INR Reading.  The next INR is due 10 days.  Results were reviewed/authorized by Marga Melnick, MD.

## 2010-09-22 ENCOUNTER — Encounter: Payer: Self-pay | Admitting: Internal Medicine

## 2010-09-23 NOTE — Letter (Signed)
Summary: [facility]   Imported By: Maryln Gottron 09/18/2010 10:09:17  _____________________________________________________________________  External Attachment:    Type:   Image     Comment:   External Document

## 2010-09-24 ENCOUNTER — Ambulatory Visit (INDEPENDENT_AMBULATORY_CARE_PROVIDER_SITE_OTHER): Payer: Medicare Other | Admitting: *Deleted

## 2010-09-24 DIAGNOSIS — Z86718 Personal history of other venous thrombosis and embolism: Secondary | ICD-10-CM

## 2010-09-24 DIAGNOSIS — Z7901 Long term (current) use of anticoagulants: Secondary | ICD-10-CM

## 2010-09-24 DIAGNOSIS — Z5181 Encounter for therapeutic drug level monitoring: Secondary | ICD-10-CM

## 2010-09-24 NOTE — Patient Instructions (Signed)
Return to office in 4 weeks  No change continue current dose: 5 mg daily except 2.5mg  M,F

## 2010-09-25 ENCOUNTER — Other Ambulatory Visit: Payer: Self-pay | Admitting: Internal Medicine

## 2010-09-25 MED ORDER — WARFARIN SODIUM 2.5 MG PO TABS: 2.5000 mg | ORAL_TABLET | ORAL | Status: DC

## 2010-09-25 NOTE — Telephone Encounter (Signed)
Please note patient claim they've changed her dosage and or strength?

## 2010-10-24 ENCOUNTER — Other Ambulatory Visit: Payer: Medicare Other

## 2010-10-24 ENCOUNTER — Ambulatory Visit: Payer: Medicare Other

## 2010-10-28 ENCOUNTER — Other Ambulatory Visit (INDEPENDENT_AMBULATORY_CARE_PROVIDER_SITE_OTHER): Payer: Medicare Other

## 2010-10-28 ENCOUNTER — Other Ambulatory Visit: Payer: Self-pay | Admitting: *Deleted

## 2010-10-28 ENCOUNTER — Ambulatory Visit (INDEPENDENT_AMBULATORY_CARE_PROVIDER_SITE_OTHER): Payer: Medicare Other | Admitting: *Deleted

## 2010-10-28 DIAGNOSIS — D649 Anemia, unspecified: Secondary | ICD-10-CM

## 2010-10-28 DIAGNOSIS — I4891 Unspecified atrial fibrillation: Secondary | ICD-10-CM

## 2010-10-28 DIAGNOSIS — Z7901 Long term (current) use of anticoagulants: Secondary | ICD-10-CM

## 2010-10-28 MED ORDER — WARFARIN SODIUM 2.5 MG PO TABS: 2.5000 mg | ORAL_TABLET | ORAL | Status: DC

## 2010-10-28 NOTE — Patient Instructions (Signed)
Pt aware no change recheck 4 weeks  

## 2010-10-29 LAB — CBC WITH DIFFERENTIAL/PLATELET
Basophils Absolute: 0 10*3/uL (ref 0.0–0.1)
Basophils Relative: 0.4 % (ref 0.0–3.0)
Eosinophils Absolute: 0.2 10*3/uL (ref 0.0–0.7)
Lymphocytes Relative: 48.9 % — ABNORMAL HIGH (ref 12.0–46.0)
MCHC: 33.9 g/dL (ref 30.0–36.0)
MCV: 87.7 fl (ref 78.0–100.0)
Monocytes Absolute: 0.5 10*3/uL (ref 0.1–1.0)
Neutrophils Relative %: 31.6 % — ABNORMAL LOW (ref 43.0–77.0)
Platelets: 120 10*3/uL — ABNORMAL LOW (ref 150.0–400.0)
RBC: 4.7 Mil/uL (ref 3.87–5.11)
RDW: 16.8 % — ABNORMAL HIGH (ref 11.5–14.6)

## 2010-10-29 LAB — IBC PANEL: Transferrin: 246.8 mg/dL (ref 212.0–360.0)
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
## 2010-11-18 NOTE — Discharge Summary (Signed)
NAMEGAILE, ALLMON                 ACCOUNT NO.:  [id number]   MEDICAL RECORD NO.:  [id number]          PATIENT TYPE:  INP   LOCATION:  1609                         FACILITY:  [facility]   PHYSICIAN:  Madlyn Frankel. Charlann Boxer, M.D.  DATE OF BIRTH:  [birth year]   DATE OF ADMISSION:  01/03/2007  DATE OF DISCHARGE:                               DISCHARGE SUMMARY   ADDENDUM   LABORATORIES:  Preadmission hematocrit was 34.6.  She was transfused  units during surgery, was then transfused 2 units after surgery.  At  time of discharge, hematocrit was stable at 28.4.  Platelets at 110.  Coags upon admission:  First INR was 1.4 after Coumadin.  Her INR pre  discharge was 2.1 and therapeutic.  Routine chemistries upon admission:  Glucose stable at 101, BUN 26, creatinine 1.36, ALT was 38.  Upon  discharge tracked throughout her glucose remained stable, creatinine  remained stable at 1.29.  Sodium and potassium normal.  UA negative upon  admission.  UA clean-catch on July 3, awaiting results.  Treated  prophylactically for burning urination and potential urinary tract  infection.   SPECIAL INSTRUCTIONS:  If UA negative, still continue Cipro for 3 days.     ______________________________  Yetta Glassman [address], [place]      Madlyn Frankel. Charlann Boxer, M.D.  Electronically Signed    BLM/MEDQ  D:  01/06/2007  T:  01/06/2007  Job:  [id number]
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
## 2010-11-18 NOTE — Discharge Summary (Signed)
NAMEIVERNA, HAMMAC                 ACCOUNT NO.:  [id number]   MEDICAL RECORD NO.:  [id number]          PATIENT TYPE:  INP   LOCATION:  1609                         FACILITY:  [facility]   PHYSICIAN:  Madlyn Frankel. Charlann Boxer, M.D.  DATE OF BIRTH:  [birth year]   DATE OF ADMISSION:  01/03/2007  DATE OF DISCHARGE:                               DISCHARGE SUMMARY   ADDENDUM:   HOSPITAL COURSE:  The patient was still in the hospital as of the third.  She stayed in hospital throughout the weekend.  She remained stable,  afebrile throughout.  Cipro was begun for UTI symptoms,  and continued  throughout the weekend.  INR remained very therapeutic (above 2).  On  Monday, when we saw her she had no complaints.  The dressing was  changed.  She continued to be neurovascularly intact.  Her INR was 2.3.  She was febrile and ready for discharged to a skilled nursing facility.   PHYSICAL THERAPY:  She had progressed nicely,  She was walking 200 feet;  partial weightbearing with just verbal cues.  Still would continue to  benefit from killed nursing facility.   DISCHARGE DISPOSITION:  Discharged to skilled nursing facility  rehabilitation; I want that available.     ______________________________  Yetta Glassman. [address], [place]      Madlyn Frankel. Charlann Boxer, M.D.  Electronically Signed    BLM/MEDQ  D:  01/10/2007  T:  01/10/2007  Job:  [id number]
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
## 2010-11-21 NOTE — Letter (Signed)
May 23, 2006    Doralee Albino. [name], M.D.  [facility]  [address]  [place], [place] [address]   RE:  NASHLY, OLSSON  MRN:  [id number]  /  DOB:  [birth year]   Dear Dr. [name]:   Sinda Du has been on Coumadin approximately a year following an episode  of deep vein thrombosis in the context of cellulitis of her leg.  At this  time it would be appropriate to consider venous Doppler studies and if  negative, consider weaning her off Coumadin.   Apparently, you had discussed with Mrs. Zenner eventually performing a total  left hip replacement.  If you feel that is clinically appropriate, then I  would want to keep her on Coumadin until the immediate perioperative period  when she could be switched to Lovenox.  Postoperatively, she could resume  the Coumadin for a less-abbreviated course until any danger of recurrent  deep vein thrombosis has passed.   She continues to have residual pain in her left lower extremity, hip to  ankle, even at rest.  She also has some lumbosacral pain with ambulation.  She states that you had questioned a possible sciatic process.   She has no previous past medical history of coagulopathy, deep venous  thrombosis, or pulmonary thromboembolic, and therefore, life-long Coumadin  is not recommended unless there is evidence of predisposition to venous  thrombosis or recurrence of such.   Would you be kind enough to evaluate her orthopedic status at this time as  to whether additional surgery is clinically appropriate.   If surgery is considered, then I would refer Mrs. Narvaez to the Coumadin  Clinic for perioperative management.   It had been difficult to manage her blood pressure initially, but adequate  control is thought to be present at this time.There are no definite medical  contraindications to surgery, although she would be at high risk because of  her age and deconditioning.    Sincerely,      Titus Dubin. Alwyn Ren, MD,FACP,FCCP  Electronically Signed    [facility]/MedQ  DD: 05/23/2006  DT: 05/23/2006  Job #: [id number]   CC:    Austin Miles. Jerrye Beavers at [employer]
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
## 2010-11-25 ENCOUNTER — Ambulatory Visit: Payer: Medicare Other

## 2010-11-26 ENCOUNTER — Ambulatory Visit (INDEPENDENT_AMBULATORY_CARE_PROVIDER_SITE_OTHER): Payer: Medicare Other | Admitting: Internal Medicine

## 2010-11-26 ENCOUNTER — Encounter: Payer: Self-pay | Admitting: Internal Medicine

## 2010-11-26 DIAGNOSIS — IMO0002 Reserved for concepts with insufficient information to code with codable children: Secondary | ICD-10-CM

## 2010-11-26 DIAGNOSIS — Z7901 Long term (current) use of anticoagulants: Secondary | ICD-10-CM

## 2010-11-26 DIAGNOSIS — L02414 Cutaneous abscess of left upper limb: Secondary | ICD-10-CM

## 2010-11-26 DIAGNOSIS — I4891 Unspecified atrial fibrillation: Secondary | ICD-10-CM

## 2010-11-26 MED ORDER — AMOXICILLIN-POT CLAVULANATE 500-125 MG PO TABS
1.0000 | ORAL_TABLET | Freq: Three times a day (TID) | ORAL | Status: AC
Start: 2010-11-26 — End: 2010-12-06

## 2010-11-26 MED ORDER — MUPIROCIN 2 % EX OINT: TOPICAL_OINTMENT | CUTANEOUS | Status: DC

## 2010-11-26 NOTE — Patient Instructions (Addendum)
Apply Bactroban cream to the wound twice a day after using wet-to-dry saline dressings. No change in PT/INR in  3  weeks

## 2010-11-26 NOTE — Progress Notes (Signed)
  Subjective:    Patient ID: [name], female    DOB: 01-Sep-1926, 77 y.o.   MRN: [id number]  HPI Onset:last week as painful lump LUE Trigger/injury:no Redness, swelling:yes ; "like a big knot" Constitutional: Fever, chills, sweats:no Heme: Abnormal bruising or clotting ( on warfarin; PT/INR 3.1), lymphadenopathy:no Treatment/response:topical "drawing salve " with drainage of watery , bloody secretions     Review of Systems she denies chest pain, palpitations, or shortness of breath(please see rhythm)     Objective:   Physical Exam she is in no acute distress. She has no lymphadenopathy in the head, neck, or axillary areas. She exhibits a slow irregular rhythm. Chest is clear with no increased work of breathing. She has no pedal edema. Pulses are intact.  There is a 15 x 15 mm subcutaneous erythematous lesion on the left upper extremity with a central ulcer and some cheesy-appearing material extruding from the ulcer. There is scant serosanguineous drainage.          Assessment & Plan:  #1 abscess, probable infected epidermoid inclusion cyst  #2 atrial fibrillation with controlled ventricular rate  Plan: Augmentin, Bactroban, wet to dry saline dressings.  No change in warfarin; recheck level in 3 weeks.

## 2010-11-29 LAB — WOUND CULTURE: Organism ID, Bacteria: NO GROWTH

## 2010-12-17 ENCOUNTER — Ambulatory Visit (INDEPENDENT_AMBULATORY_CARE_PROVIDER_SITE_OTHER): Payer: Medicare Other | Admitting: *Deleted

## 2010-12-17 DIAGNOSIS — Z7901 Long term (current) use of anticoagulants: Secondary | ICD-10-CM

## 2010-12-17 DIAGNOSIS — I4891 Unspecified atrial fibrillation: Secondary | ICD-10-CM

## 2010-12-17 LAB — POCT INR: INR: 3.5

## 2010-12-17 NOTE — Patient Instructions (Signed)
Pt informed that per Hop  NEW REGIMEN: 5mg  daily except 2.5mg  M,W,F

## 2011-01-14 ENCOUNTER — Ambulatory Visit (INDEPENDENT_AMBULATORY_CARE_PROVIDER_SITE_OTHER): Payer: Medicare Other | Admitting: *Deleted

## 2011-01-14 DIAGNOSIS — I4891 Unspecified atrial fibrillation: Secondary | ICD-10-CM

## 2011-01-14 DIAGNOSIS — Z7901 Long term (current) use of anticoagulants: Secondary | ICD-10-CM

## 2011-01-14 NOTE — Patient Instructions (Signed)
Pt aware of no change recheck 4 weeks

## 2011-01-15 ENCOUNTER — Other Ambulatory Visit: Payer: Self-pay

## 2011-01-15 MED ORDER — DILTIAZEM HCL ER COATED BEADS 300 MG PO CP24: 300.0000 mg | ORAL_CAPSULE | Freq: Every day | ORAL | Status: DC

## 2011-01-15 MED ORDER — WARFARIN SODIUM 2.5 MG PO TABS: 2.5000 mg | ORAL_TABLET | ORAL | Status: DC

## 2011-01-15 NOTE — Telephone Encounter (Signed)
RX's sent to pharmacy 

## 2011-02-11 ENCOUNTER — Ambulatory Visit (INDEPENDENT_AMBULATORY_CARE_PROVIDER_SITE_OTHER): Payer: Medicare Other | Admitting: *Deleted

## 2011-02-11 DIAGNOSIS — I4891 Unspecified atrial fibrillation: Secondary | ICD-10-CM

## 2011-02-11 DIAGNOSIS — Z7901 Long term (current) use of anticoagulants: Secondary | ICD-10-CM

## 2011-02-11 LAB — POCT INR: INR: 3.9

## 2011-02-11 NOTE — Patient Instructions (Signed)
Return to office in 1 week for PT/INR  New Dosing: 1 tablet today then 1 tab (2.5 mg) daily except 2 tab (5mg )  M,W,F

## 2011-02-18 ENCOUNTER — Ambulatory Visit: Payer: Medicare Other

## 2011-02-19 ENCOUNTER — Ambulatory Visit: Payer: Medicare Other

## 2011-02-20 ENCOUNTER — Ambulatory Visit (INDEPENDENT_AMBULATORY_CARE_PROVIDER_SITE_OTHER): Payer: Medicare Other | Admitting: *Deleted

## 2011-02-20 DIAGNOSIS — I4891 Unspecified atrial fibrillation: Secondary | ICD-10-CM

## 2011-02-20 DIAGNOSIS — Z7901 Long term (current) use of anticoagulants: Secondary | ICD-10-CM

## 2011-02-20 NOTE — Patient Instructions (Signed)
Per Dr. Alwyn Ren pt instructed 2.5mg  daily (including today) and 5mg  T/TH recheck 4 weeks

## 2011-02-23 ENCOUNTER — Ambulatory Visit: Payer: Medicare Other

## 2011-03-03 ENCOUNTER — Other Ambulatory Visit: Payer: Self-pay | Admitting: Oncology

## 2011-03-03 ENCOUNTER — Encounter (HOSPITAL_BASED_OUTPATIENT_CLINIC_OR_DEPARTMENT_OTHER): Payer: Medicare Other | Admitting: Oncology

## 2011-03-03 DIAGNOSIS — C189 Malignant neoplasm of colon, unspecified: Secondary | ICD-10-CM

## 2011-03-03 DIAGNOSIS — M6140 Other calcification of muscle, unspecified site: Secondary | ICD-10-CM

## 2011-03-03 LAB — COMPREHENSIVE METABOLIC PANEL
AST: 20 U/L (ref 0–37)
Alkaline Phosphatase: 40 U/L (ref 39–117)
BUN: 26 mg/dL — ABNORMAL HIGH (ref 6–23)
Creatinine, Ser: 1.28 mg/dL — ABNORMAL HIGH (ref 0.50–1.10)

## 2011-03-03 LAB — CBC WITH DIFFERENTIAL/PLATELET
Basophils Absolute: 0 10*3/uL (ref 0.0–0.1)
EOS%: 2 % (ref 0.0–7.0)
HCT: 43.4 % (ref 34.8–46.6)
HGB: 14.8 g/dL (ref 11.6–15.9)
LYMPH%: 25.1 % (ref 14.0–49.7)
MCH: 31.6 pg (ref 25.1–34.0)
MCV: 92.6 fL (ref 79.5–101.0)
MONO%: 8.4 % (ref 0.0–14.0)
NEUT%: 64.2 % (ref 38.4–76.8)
Platelets: 120 10*3/uL — ABNORMAL LOW (ref 145–400)

## 2011-03-10 ENCOUNTER — Other Ambulatory Visit: Payer: Self-pay | Admitting: Internal Medicine

## 2011-03-11 MED ORDER — WARFARIN SODIUM 2.5 MG PO TABS: 2.5000 mg | ORAL_TABLET | ORAL | Status: DC

## 2011-03-11 NOTE — Telephone Encounter (Signed)
Rx sent 

## 2011-03-24 ENCOUNTER — Ambulatory Visit: Payer: Medicare Other

## 2011-03-27 ENCOUNTER — Ambulatory Visit: Payer: Medicare Other

## 2011-03-31 ENCOUNTER — Ambulatory Visit (INDEPENDENT_AMBULATORY_CARE_PROVIDER_SITE_OTHER): Payer: Medicare Other

## 2011-03-31 DIAGNOSIS — Z7901 Long term (current) use of anticoagulants: Secondary | ICD-10-CM

## 2011-03-31 DIAGNOSIS — I4891 Unspecified atrial fibrillation: Secondary | ICD-10-CM

## 2011-03-31 LAB — POCT INR
INR: 2.6
INR: 2.6

## 2011-03-31 NOTE — Patient Instructions (Signed)
Per Dr.Hopper no change, recheck in 4 weeks

## 2011-04-21 LAB — CBC
HCT: 24.2 — ABNORMAL LOW
HCT: 28.4 — ABNORMAL LOW
Hemoglobin: 8.4 — ABNORMAL LOW
Hemoglobin: 9.8 — ABNORMAL LOW
MCV: 88.7
Platelets: 110 — ABNORMAL LOW
RBC: 2.7 — ABNORMAL LOW
RDW: 15.9 — ABNORMAL HIGH
WBC: 7.5

## 2011-04-21 LAB — BASIC METABOLIC PANEL
BUN: 21
CO2: 24
CO2: 24
Calcium: 7.8 — ABNORMAL LOW
Chloride: 111
Chloride: 111
Creatinine, Ser: 1.29 — ABNORMAL HIGH
Creatinine, Ser: 1.37 — ABNORMAL HIGH
GFR calc Af Amer: 48 — ABNORMAL LOW
Glucose, Bld: 108 — ABNORMAL HIGH
Glucose, Bld: 133 — ABNORMAL HIGH
Potassium: 4.6

## 2011-04-21 LAB — PROTIME-INR
INR: 1.4
INR: 2.1 — ABNORMAL HIGH
INR: 2.1 — ABNORMAL HIGH
INR: 2.3 — ABNORMAL HIGH
Prothrombin Time: 24.1 — ABNORMAL HIGH

## 2011-04-21 LAB — URINE MICROSCOPIC-ADD ON

## 2011-04-21 LAB — URINALYSIS, ROUTINE W REFLEX MICROSCOPIC
Bilirubin Urine: NEGATIVE
Glucose, UA: NEGATIVE
Ketones, ur: NEGATIVE
Leukocytes, UA: NEGATIVE
Nitrite: NEGATIVE
Specific Gravity, Urine: 1.007
pH: 7

## 2011-04-22 LAB — URINALYSIS, ROUTINE W REFLEX MICROSCOPIC
Glucose, UA: NEGATIVE
Nitrite: NEGATIVE
Specific Gravity, Urine: 1.012
pH: 6

## 2011-04-22 LAB — CBC
HCT: 34.6 — ABNORMAL LOW
Platelets: 160
RBC: 3.78 — ABNORMAL LOW
WBC: 5.1

## 2011-04-22 LAB — PROTIME-INR: Prothrombin Time: 17.7 — ABNORMAL HIGH

## 2011-04-22 LAB — TYPE AND SCREEN: ABO/RH(D): A POS

## 2011-04-22 LAB — COMPREHENSIVE METABOLIC PANEL
AST: 35
Albumin: 4
Alkaline Phosphatase: 75
BUN: 26 — ABNORMAL HIGH
CO2: 23
Chloride: 108
Potassium: 5.1
Total Bilirubin: 0.3

## 2011-04-22 LAB — URINE MICROSCOPIC-ADD ON

## 2011-04-22 LAB — HEMOGLOBIN AND HEMATOCRIT, BLOOD: HCT: 30.9 — ABNORMAL LOW

## 2011-04-28 ENCOUNTER — Ambulatory Visit (INDEPENDENT_AMBULATORY_CARE_PROVIDER_SITE_OTHER): Payer: Medicare Other | Admitting: *Deleted

## 2011-04-28 DIAGNOSIS — I4891 Unspecified atrial fibrillation: Secondary | ICD-10-CM

## 2011-04-28 DIAGNOSIS — Z7901 Long term (current) use of anticoagulants: Secondary | ICD-10-CM

## 2011-04-28 LAB — POCT INR: INR: 2.2

## 2011-04-28 NOTE — Patient Instructions (Signed)
No change; recheck 4 weeks

## 2011-05-19 ENCOUNTER — Encounter: Payer: Self-pay | Admitting: Gastroenterology

## 2011-05-22 ENCOUNTER — Ambulatory Visit: Payer: Medicare Other

## 2011-05-26 ENCOUNTER — Ambulatory Visit (INDEPENDENT_AMBULATORY_CARE_PROVIDER_SITE_OTHER): Payer: Medicare Other | Admitting: *Deleted

## 2011-05-26 DIAGNOSIS — I4891 Unspecified atrial fibrillation: Secondary | ICD-10-CM

## 2011-05-26 DIAGNOSIS — Z7901 Long term (current) use of anticoagulants: Secondary | ICD-10-CM

## 2011-05-26 MED ORDER — WARFARIN SODIUM 2.5 MG PO TABS: 2.5000 mg | ORAL_TABLET | ORAL | Status: DC

## 2011-05-26 NOTE — Patient Instructions (Signed)
Return to office in 4 weeks  Continue current dose: 2.5mg  daily, 5mg  on Tue/Thurs

## 2011-06-23 ENCOUNTER — Encounter: Payer: Self-pay | Admitting: Internal Medicine

## 2011-06-24 ENCOUNTER — Ambulatory Visit (INDEPENDENT_AMBULATORY_CARE_PROVIDER_SITE_OTHER): Payer: Medicare Other | Admitting: Internal Medicine

## 2011-06-24 ENCOUNTER — Encounter: Payer: Self-pay | Admitting: Internal Medicine

## 2011-06-24 DIAGNOSIS — R600 Localized edema: Secondary | ICD-10-CM

## 2011-06-24 DIAGNOSIS — R609 Edema, unspecified: Secondary | ICD-10-CM

## 2011-06-24 DIAGNOSIS — Z7901 Long term (current) use of anticoagulants: Secondary | ICD-10-CM

## 2011-06-24 DIAGNOSIS — I4891 Unspecified atrial fibrillation: Secondary | ICD-10-CM

## 2011-06-24 DIAGNOSIS — R0609 Other forms of dyspnea: Secondary | ICD-10-CM

## 2011-06-24 DIAGNOSIS — R0989 Other specified symptoms and signs involving the circulatory and respiratory systems: Secondary | ICD-10-CM

## 2011-06-24 LAB — POCT INR: INR: 2.3

## 2011-06-24 MED ORDER — FUROSEMIDE 40 MG PO TABS: 40.0000 mg | ORAL_TABLET | Freq: Every day | ORAL | Status: DC

## 2011-06-24 NOTE — Patient Instructions (Signed)
Avoid ingestion of  excess salt/sodium.Cook with pepper & other spices . Use the salt substitute "No Salt"(unless your potassium has been elevated) OR the Mrs Sharilyn Sites products to season food @ the table. Avoid foods which taste salty or "vinegary" as their sodium contentet will be high.
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
## 2011-06-25 ENCOUNTER — Other Ambulatory Visit: Payer: Self-pay | Admitting: Internal Medicine

## 2011-06-25 LAB — BASIC METABOLIC PANEL
BUN: 16 mg/dL (ref 6–23)
Chloride: 110 mEq/L (ref 96–112)
Creatinine, Ser: 1.1 mg/dL (ref 0.4–1.2)

## 2011-06-25 LAB — HEPATIC FUNCTION PANEL
ALT: 13 U/L (ref 0–35)
Albumin: 4.7 g/dL (ref 3.5–5.2)
Total Bilirubin: 0.8 mg/dL (ref 0.3–1.2)
Total Protein: 7.7 g/dL (ref 6.0–8.3)

## 2011-06-25 LAB — TSH: TSH: 1.79 u[IU]/mL (ref 0.35–5.50)

## 2011-06-25 NOTE — Progress Notes (Signed)
Addended by: Legrand Como on: 06/25/2011 08:35 AM   Modules accepted: Orders

## 2011-06-26 LAB — CBC WITH DIFFERENTIAL/PLATELET
Basophils Absolute: 0 10*3/uL (ref 0.0–0.1)
Basophils Relative: 1 % (ref 0–1)
Hemoglobin: 14.8 g/dL (ref 12.0–15.0)
MCHC: 32.5 g/dL (ref 30.0–36.0)
Neutro Abs: 1 10*3/uL — ABNORMAL LOW (ref 1.7–7.7)
Neutrophils Relative %: 45 % (ref 43–77)
Platelets: 108 10*3/uL — ABNORMAL LOW (ref 150–400)
RDW: 14.2 % (ref 11.5–15.5)

## 2011-06-26 LAB — BRAIN NATRIURETIC PEPTIDE: Brain Natriuretic Peptide: 159 pg/mL — ABNORMAL HIGH (ref 0.0–100.0)

## 2011-06-29 ENCOUNTER — Telehealth: Payer: Self-pay

## 2011-06-29 NOTE — Telephone Encounter (Signed)
Message left on voicemail: patient's daughter would like a call to discuss labs  I called and left message on voicemail with lab results from 06/24/2011 (copy to be mailed)

## 2011-07-13 ENCOUNTER — Other Ambulatory Visit: Payer: Self-pay | Admitting: Internal Medicine

## 2011-07-13 MED ORDER — DILTIAZEM HCL ER COATED BEADS 300 MG PO CP24: 300.0000 mg | ORAL_CAPSULE | Freq: Every day | ORAL | Status: DC

## 2011-07-13 MED ORDER — ALPRAZOLAM 0.25 MG PO TABS: 0.2500 mg | ORAL_TABLET | Freq: Two times a day (BID) | ORAL | Status: DC | PRN

## 2011-07-13 NOTE — Telephone Encounter (Signed)
RXs sent.

## 2011-07-23 ENCOUNTER — Ambulatory Visit: Payer: Medicare Other | Admitting: Cardiovascular Disease

## 2011-07-29 ENCOUNTER — Other Ambulatory Visit: Payer: Medicare Other

## 2011-07-30 ENCOUNTER — Ambulatory Visit: Payer: Medicare Other | Admitting: Internal Medicine

## 2011-08-04 ENCOUNTER — Ambulatory Visit: Payer: Medicare Other | Admitting: Cardiovascular Disease

## 2011-08-06 ENCOUNTER — Ambulatory Visit (INDEPENDENT_AMBULATORY_CARE_PROVIDER_SITE_OTHER): Payer: Medicare Other | Admitting: Internal Medicine

## 2011-08-06 ENCOUNTER — Ambulatory Visit (HOSPITAL_BASED_OUTPATIENT_CLINIC_OR_DEPARTMENT_OTHER)
Admission: RE | Admit: 2011-08-06 | Discharge: 2011-08-06 | Disposition: A | Discharge status: Home / Self Care | Payer: Medicare Other | Source: Ambulatory Visit | Attending: Internal Medicine | Admitting: Internal Medicine

## 2011-08-06 ENCOUNTER — Encounter: Payer: Self-pay | Admitting: Internal Medicine

## 2011-08-06 DIAGNOSIS — R0989 Other specified symptoms and signs involving the circulatory and respiratory systems: Secondary | ICD-10-CM | POA: Diagnosis not present

## 2011-08-06 DIAGNOSIS — R059 Cough, unspecified: Secondary | ICD-10-CM | POA: Diagnosis not present

## 2011-08-06 DIAGNOSIS — J209 Acute bronchitis, unspecified: Secondary | ICD-10-CM

## 2011-08-06 DIAGNOSIS — Z7901 Long term (current) use of anticoagulants: Secondary | ICD-10-CM

## 2011-08-06 DIAGNOSIS — I4891 Unspecified atrial fibrillation: Secondary | ICD-10-CM

## 2011-08-06 DIAGNOSIS — R05 Cough: Secondary | ICD-10-CM | POA: Insufficient documentation

## 2011-08-06 MED ORDER — FLUTICASONE-SALMETEROL 100-50 MCG/DOSE IN AEPB: 1.0000 | INHALATION_SPRAY | Freq: Two times a day (BID) | RESPIRATORY_TRACT | Status: DC

## 2011-08-06 MED ORDER — WARFARIN SODIUM 2.5 MG PO TABS: 2.5000 mg | ORAL_TABLET | ORAL | Status: DC

## 2011-08-06 MED ORDER — DOXYCYCLINE HYCLATE 100 MG PO CAPS: 100.0000 mg | ORAL_CAPSULE | Freq: Two times a day (BID) | ORAL | Status: AC

## 2011-08-06 NOTE — Progress Notes (Signed)
  Subjective:    Patient ID: [name], female    DOB: 1927-07-05, 78 y.o.   MRN: [id number]  HPI Respiratory tract infection Onset/symptoms:1 week ago as diarrhea Exposures (illness/environmental/extrinsic):daughter ill Progression of symptoms:to cough with sputum Treatments/response:Tylenol helped headache Present symptoms: Fever/chills/sweats:fever until 1/29 Frontal headache:improving Facial pain:improving Nasal purulence:clear Sore throat:scratchy , 2 on 10 scale Dental pain:no Lymphadenopathy:no Wheezing/shortness of breath:both Cough/sputum/hemoptysis:"wet" cough , sputum clear Pleuritic pain:no  Past medical history is negative for asthma             Review of Systems she denies excess dorsal chest pain but does have dyspnea on exertion.She denies paroxysmal nocturnal dyspnea. Her edema has responded to furosemide.  She denies any melena or rectal bleeding.  She was still seeing her cardiologist last week but had to cancel because of the respiratory illness.    Objective:   Physical Exam General appearance:good health ;well nourished; no acute distress or increased work of breathing is present.  No  lymphadenopathy about the head, neck, or axilla noted.   Eyes: No conjunctival inflammation or lid edema is present. There is no scleral icterus.  Ears:  External ear exam shows no significant lesions or deformities.  Otoscopic examination reveals clear canals, tympanic membranes are intact bilaterally without bulging, retraction, inflammation or discharge.  Nose:  External nasal examination shows no deformity or inflammation. Nasal mucosa are pink and moist without lesions or exudates. No septal dislocation or deviation.No obstruction to airflow.  Hyponasal speech   Oral exam: Dental caries; lips and gums are healthy appearing.There is no oropharyngeal erythema or exudate noted.      Heart:  Irregular rhythm & rate; clinically AF Lungs:Chest : Inspiratory  pops and wheezes at the bases especially right lower lobe. No increased work of breathing.    Extremities:  No cyanosis or clubbing  noted ; mixed arthritic hand changes are noted. Edema is present to mid calf at the sock line. Homans sign is negative.  Pedal pulses are decreased but intact.   Skin: Warm & dry w/o jaundice or tenting.          Assessment & Plan:   #1 bronchitis with bronchospastic component  #2 upper respiratory infection, acute. Atypical organisms  suspect  #3 irregular rhythm, clinically atrial fibrillation  Plan: See orders and recommendations

## 2011-08-06 NOTE — Patient Instructions (Signed)
Advair one inhalation every 12 hours; gargle and spit after use Order for x-rays entered into  the computer; these will be performed at [facility]. No appointment is necessary.   Change present warfarin dose to 2.5 mg  Daily EXCEPT 5 mg every Weds . Recheck PT/INR in 1 week.

## 2011-08-07 ENCOUNTER — Other Ambulatory Visit (HOSPITAL_BASED_OUTPATIENT_CLINIC_OR_DEPARTMENT_OTHER): Payer: Self-pay | Admitting: Interventional Cardiology

## 2011-08-07 DIAGNOSIS — R109 Unspecified abdominal pain: Secondary | ICD-10-CM

## 2011-08-11 ENCOUNTER — Ambulatory Visit: Payer: Medicare Other

## 2011-08-12 ENCOUNTER — Ambulatory Visit (INDEPENDENT_AMBULATORY_CARE_PROVIDER_SITE_OTHER): Payer: Medicare Other | Admitting: *Deleted

## 2011-08-12 DIAGNOSIS — I4891 Unspecified atrial fibrillation: Secondary | ICD-10-CM

## 2011-08-12 DIAGNOSIS — Z7901 Long term (current) use of anticoagulants: Secondary | ICD-10-CM

## 2011-08-12 LAB — POCT INR: INR: 2.2

## 2011-08-12 NOTE — Patient Instructions (Signed)
Return to office in 3 weeks  Continue current dose: 2.5mg  daily, 5mg  on wed

## 2011-08-15 ENCOUNTER — Telehealth: Payer: Self-pay | Admitting: Oncology

## 2011-08-15 NOTE — Telephone Encounter (Signed)
called pts lmovm for appts for [id number] and to rtn call to confirm appts

## 2011-08-26 ENCOUNTER — Encounter: Payer: Self-pay | Admitting: Cardiovascular Disease

## 2011-08-26 ENCOUNTER — Ambulatory Visit (INDEPENDENT_AMBULATORY_CARE_PROVIDER_SITE_OTHER): Payer: Medicare Other | Admitting: Cardiovascular Disease

## 2011-08-26 VITALS — BP 160/80 | HR 91 | Ht 65.0 in | Wt 213.0 lb

## 2011-08-26 DIAGNOSIS — I4891 Unspecified atrial fibrillation: Secondary | ICD-10-CM | POA: Diagnosis not present

## 2011-08-26 DIAGNOSIS — I059 Rheumatic mitral valve disease, unspecified: Secondary | ICD-10-CM | POA: Diagnosis not present

## 2011-08-26 DIAGNOSIS — I340 Nonrheumatic mitral (valve) insufficiency: Secondary | ICD-10-CM | POA: Insufficient documentation

## 2011-08-26 NOTE — Assessment & Plan Note (Signed)
Will get echo to assess. Moderate by echo 2011.

## 2011-08-26 NOTE — Assessment & Plan Note (Signed)
She is in atrial fibrillation. Her rate is well controlled. Will continue Cardizem. Will continue coumadin. She is having no bleeding.  Will repeat echo to assess LV function again and also to reassess MR.

## 2011-08-26 NOTE — Patient Instructions (Signed)
Your physician recommends that you schedule a follow-up appointment in: 3 months  Your physician has requested that you have an echocardiogram. Echocardiography is a painless test that uses sound waves to create images of your heart. It provides your doctor with information about the size and shape of your heart and how well your heart's chambers and valves are working. This procedure takes approximately one hour. There are no restrictions for this procedure.   
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
## 2011-09-02 ENCOUNTER — Ambulatory Visit: Payer: Medicare Other

## 2011-09-03 ENCOUNTER — Ambulatory Visit (INDEPENDENT_AMBULATORY_CARE_PROVIDER_SITE_OTHER): Payer: Medicare Other

## 2011-09-03 DIAGNOSIS — I4891 Unspecified atrial fibrillation: Secondary | ICD-10-CM | POA: Diagnosis not present

## 2011-09-03 DIAGNOSIS — Z7901 Long term (current) use of anticoagulants: Secondary | ICD-10-CM

## 2011-09-03 LAB — POCT INR: INR: 2.5

## 2011-09-03 NOTE — Patient Instructions (Signed)
Per MD no change and recheck in 4 weeks

## 2011-09-09 ENCOUNTER — Other Ambulatory Visit (HOSPITAL_COMMUNITY): Payer: Medicare Other

## 2011-09-24 ENCOUNTER — Other Ambulatory Visit: Payer: Self-pay

## 2011-09-24 MED ORDER — PROMETHAZINE HCL 25 MG PO TABS: ORAL_TABLET | ORAL | Status: DC

## 2011-09-24 NOTE — Telephone Encounter (Signed)
Patient requested rx for promethazine to have on hand if and when needed.  Per Dr.Hopper ok to dispense #5/1 refill

## 2011-09-30 ENCOUNTER — Ambulatory Visit: Payer: Medicare Other

## 2011-10-01 ENCOUNTER — Ambulatory Visit: Payer: Medicare Other

## 2011-10-07 ENCOUNTER — Other Ambulatory Visit: Payer: Self-pay

## 2011-10-07 ENCOUNTER — Ambulatory Visit (HOSPITAL_COMMUNITY): Payer: Medicare Other | Attending: Cardiology

## 2011-10-07 DIAGNOSIS — I059 Rheumatic mitral valve disease, unspecified: Secondary | ICD-10-CM | POA: Diagnosis not present

## 2011-10-07 DIAGNOSIS — I4891 Unspecified atrial fibrillation: Secondary | ICD-10-CM | POA: Diagnosis not present

## 2011-10-07 DIAGNOSIS — I517 Cardiomegaly: Secondary | ICD-10-CM | POA: Diagnosis not present

## 2011-10-07 DIAGNOSIS — I2789 Other specified pulmonary heart diseases: Secondary | ICD-10-CM | POA: Diagnosis not present

## 2011-10-07 DIAGNOSIS — I340 Nonrheumatic mitral (valve) insufficiency: Secondary | ICD-10-CM

## 2011-10-07 DIAGNOSIS — E669 Obesity, unspecified: Secondary | ICD-10-CM | POA: Diagnosis not present

## 2011-10-07 DIAGNOSIS — I10 Essential (primary) hypertension: Secondary | ICD-10-CM | POA: Insufficient documentation

## 2011-10-15 ENCOUNTER — Other Ambulatory Visit: Payer: Self-pay | Admitting: Internal Medicine

## 2011-10-16 ENCOUNTER — Ambulatory Visit (INDEPENDENT_AMBULATORY_CARE_PROVIDER_SITE_OTHER): Payer: Medicare Other | Admitting: *Deleted

## 2011-10-16 VITALS — BP 138/76 | HR 84 | Temp 97.8°F | Wt 219.0 lb

## 2011-10-16 DIAGNOSIS — Z7901 Long term (current) use of anticoagulants: Secondary | ICD-10-CM

## 2011-10-16 DIAGNOSIS — I4891 Unspecified atrial fibrillation: Secondary | ICD-10-CM

## 2011-10-16 NOTE — Patient Instructions (Signed)
Return to office in 4 weeks  Continue current dose: 2.5mg  daily, 5mg  on wed

## 2011-10-22 ENCOUNTER — Other Ambulatory Visit: Payer: Self-pay | Admitting: Internal Medicine

## 2011-10-23 ENCOUNTER — Other Ambulatory Visit: Payer: Self-pay | Admitting: Internal Medicine

## 2011-10-23 NOTE — Telephone Encounter (Signed)
3rd request according to pharmacy-refill for Furosemide 40 mg tab  Qty 30 Take 1-tablet by mouth daily  Cycle fill medication  Last filled 3.19.13 Last OV 1.31.13

## 2011-10-23 NOTE — Telephone Encounter (Signed)
Left message on voicemail informing pharmacy rx responded to, if not received please fill #30/5 refills

## 2011-11-02 ENCOUNTER — Telehealth: Payer: Self-pay

## 2011-11-02 NOTE — Telephone Encounter (Signed)
Message copied by Maurice Small on Mon Nov 02, 2011  4:15 PM ------      Message from: Pecola Lawless      Created: Sun Nov 01, 2011  8:43 AM       Please tell her I read her note. Three yoing , well trained Rheumatologists in [place] are Drs Dierdre Forth , Azzie Roup & Zenovia Jordan

## 2011-11-02 NOTE — Telephone Encounter (Signed)
I spoke with patient, name of doctor's given to patient for [facility] reference

## 2011-11-20 ENCOUNTER — Ambulatory Visit (INDEPENDENT_AMBULATORY_CARE_PROVIDER_SITE_OTHER): Payer: Medicare Other | Admitting: Cardiovascular Disease

## 2011-11-20 ENCOUNTER — Encounter: Payer: Self-pay | Admitting: Cardiovascular Disease

## 2011-11-20 ENCOUNTER — Ambulatory Visit (INDEPENDENT_AMBULATORY_CARE_PROVIDER_SITE_OTHER): Payer: Medicare Other | Admitting: *Deleted

## 2011-11-20 VITALS — BP 137/74 | HR 84 | Ht 66.0 in | Wt 219.0 lb

## 2011-11-20 VITALS — BP 138/76 | HR 92

## 2011-11-20 DIAGNOSIS — I4891 Unspecified atrial fibrillation: Secondary | ICD-10-CM

## 2011-11-20 NOTE — Assessment & Plan Note (Signed)
Rate controlled. Continue cardizem. Continue anti-coagulation with coumadin. INR in therapeutic range today. No bleeding issues. Echo with biatrial enlargement. Mild MR. No changes today.

## 2011-11-20 NOTE — Patient Instructions (Signed)
Your physician wants you to follow-up in:  6 months. You will receive a reminder letter in the mail two months in advance. If you don't receive a letter, please call our office to schedule the follow-up appointment.   

## 2011-11-20 NOTE — Progress Notes (Signed)
History of Present Illness: 78 yo female with history of paroxysmal atrial fibrillation, moderate LVH, moderate MR, dilated left atrium and right atrium, anemia, DVT/PE s/p IVC filter, colon cancer s/p colectomy here today for follow up. I saw her as a new patient for evaluation of irregular heart rhythm in February 2013. She told me today that in October of 2011 she first noticed irregular heart beats. She was admitted, found to be anemic and then her colon cancer was found. This was complicated by DVT and PE. She had an IVC filter placed in December 2011. She was started on coumadin back in 2006 after a traumatic hip injury and this was restarted in October of 2011. She has had no bleeding issues since then. Echo 10/07/11 with normal LV function, mild LVH, mild MR, severe biatrial enlargement, PA pressure 54-35mmHg.   She is here today for follow up and doing well. No chest pain, SOB, palpitations, near syncope or syncope. No bleeding issues.    Primary Care Physician: Alwyn Ren  Past Medical History  Diagnosis Date  . Anemia   . DVT (deep venous thrombosis) 11/06  . Pulmonary embolism 11/06  . Colon cancer 12/11  . Hypertension   . Atrial fibrillation     Past Surgical History  Procedure Date  . Appendectomy 1952  . Total hip arthroplasty 01/03/07    Left hip replacement.  . Colectomy 06/2010    partial, Dr. Dwain Sarna complicated by LLE CVT; S/P IVC umbrella & anemia  . Total abdominal hysterectomy w/ bilateral salpingoophorectomy 1973    For Fibroids  . Vena cava filter placement 06/28/2010  . Hip fracture surgery 2006    Trauma    Current Outpatient Prescriptions  Medication Sig Dispense Refill  . acetaminophen (TYLENOL) 500 MG tablet Take 500 mg by mouth every 6 (six) hours as needed.      . Calcium Carbonate-Vit D-Min (CALCIUM 1200 PO) Take by mouth daily.        Marland Kitchen diltiazem (CARDIZEM CD) 300 MG 24 hr capsule Take 1 capsule (300 mg total) by mouth daily.  90 capsule  2  .  Fluticasone-Salmeterol (ADVAIR) 100-50 MCG/DOSE AEPB Inhale 1 puff into the lungs 2 (two) times daily.  1 each  0  . furosemide (LASIX) 40 MG tablet TAKE 1 TABLET (40 MG TOTAL) BY MOUTH DAILY.  30 tablet  5  . promethazine (PHENERGAN) 25 MG tablet 1 by mouth every 6-8 hours as needed only  5 tablet  1  . traMADol (ULTRAM) 50 MG tablet Take 50 mg by mouth every 6 (six) hours as needed.        . warfarin (COUMADIN) 2.5 MG tablet Take 1 tablet (2.5 mg total) by mouth as directed. Based on PT/INR results  90 tablet  1  . ALPRAZolam (XANAX) 0.25 MG tablet Take 1 tablet (0.25 mg total) by mouth 2 (two) times daily as needed.  30 tablet  1  . DISCONTD: furosemide (LASIX) 40 MG tablet TAKE 1 TABLET (40 MG TOTAL) BY MOUTH DAILY.  30 tablet  5    No Known Allergies  History   Social History  . Marital Status: Widowed    Spouse Name: N/A    Number of Children: 1  . Years of Education: N/A   Occupational History  .     Social History Main Topics  . Smoking status: Never Smoker   . Smokeless tobacco: Not on file  . Alcohol Use: No  . Drug Use: No  .  Sexually Active: Not on file   Other Topics Concern  . Not on file   Social History Narrative  . No narrative on file    Family History  Problem Relation Age of Onset  . Peripheral vascular disease Father   . Heart attack Mother 41  . Heart disease Mother     MI  . Coronary artery disease Sister   . Diabetes Paternal Grandmother   . Coronary artery disease Maternal Grandfather     Review of Systems:  As stated in the HPI and otherwise negative.   BP 137/74  Pulse 84  Ht 5\' 6"  (1.676 m)  Wt 219 lb (99.338 kg)  BMI 35.35 kg/m2  Physical Examination: General: Well developed, well nourished, NAD HEENT: OP clear, mucus membranes moist SKIN: warm, dry. No rashes. Neuro: No focal deficits Musculoskeletal: Muscle strength 5/5 all ext Psychiatric: Mood and affect normal Neck: No JVD, no carotid bruits, no thyromegaly, no  lymphadenopathy. Lungs:Clear bilaterally, no wheezes, rhonci, crackles Cardiovascular: irregular  rate and rhythm. No murmurs, gallops or rubs. Abdomen:Soft. Bowel sounds present. Non-tender.  Extremities: No lower extremity edema. Pulses are 2 + in the bilateral DP/PT.  Echo 10/07/11: Left ventricle: The cavity size was normal. Wall thickness was increased in a pattern of mild LVH. Systolic function was normal. The estimated ejection fraction was in the range of 55% to 60%. Wall motion was normal; there were no regional wall motion abnormalities. Indeterminant diastolic function. - Aortic valve: There was no stenosis. - Mitral valve: Mildly calcified annulus. Mild regurgitation. - Left atrium: The atrium was severely dilated. - Right ventricle: The cavity size was normal. Systolic function was normal. - Right atrium: The atrium was moderately to severely dilated. - Tricuspid valve: Peak RV-RA pressure 48 mmHg. - Pulmonary arteries: PA systolic pressure 54-58 mmHg. - Systemic veins: IVC measured 1.9 cm with normal respirophasic variation, suggesting RA pressure 6-10 mmHg. Impressions:  - The patient was in atrial fibrillation. Normal LV size and systolic function, EF 55-60%. Mild LV hypertrophy. The RV was normal in size and systolic function. Moderate to severe biatrial enlargement. Moderate pulmonary hypertension.

## 2011-11-24 ENCOUNTER — Telehealth: Payer: Self-pay | Admitting: Internal Medicine

## 2011-11-24 MED ORDER — TRAMADOL HCL 50 MG PO TABS: 50.0000 mg | ORAL_TABLET | Freq: Four times a day (QID) | ORAL | Status: DC | PRN

## 2011-11-24 NOTE — Telephone Encounter (Signed)
Rx sent 

## 2011-11-24 NOTE — Telephone Encounter (Signed)
Refill: tramadol hcl 50mg  tab. Take 1 tablet every 6 hours as needed for pain. Qty 30. Last fill 11-21-10

## 2011-12-17 ENCOUNTER — Ambulatory Visit: Payer: Medicare Other

## 2011-12-22 ENCOUNTER — Ambulatory Visit: Payer: Medicare Other

## 2011-12-23 ENCOUNTER — Ambulatory Visit: Payer: Medicare Other

## 2011-12-24 ENCOUNTER — Other Ambulatory Visit: Payer: Self-pay | Admitting: Internal Medicine

## 2011-12-24 MED ORDER — ALPRAZOLAM 0.25 MG PO TABS: 0.2500 mg | ORAL_TABLET | Freq: Two times a day (BID) | ORAL | Status: DC | PRN

## 2011-12-24 NOTE — Telephone Encounter (Signed)
refill Alprazolam 0.25MG  Tab #30, take one tablet by mouth twice daily as needed, last fill 4.11.13 Last ov 1.31.13

## 2011-12-25 ENCOUNTER — Ambulatory Visit (INDEPENDENT_AMBULATORY_CARE_PROVIDER_SITE_OTHER): Payer: Medicare Other | Admitting: *Deleted

## 2011-12-25 VITALS — BP 130/76 | HR 77 | Temp 98.1°F | Wt 225.0 lb

## 2011-12-25 DIAGNOSIS — I4891 Unspecified atrial fibrillation: Secondary | ICD-10-CM

## 2011-12-25 DIAGNOSIS — Z7901 Long term (current) use of anticoagulants: Secondary | ICD-10-CM

## 2011-12-25 NOTE — Patient Instructions (Signed)
Return to office in 4 weeks  Continue current dose: 2.5 mg daily except 5 mg on wed 

## 2012-01-06 ENCOUNTER — Other Ambulatory Visit: Payer: Self-pay | Admitting: Internal Medicine

## 2012-01-19 ENCOUNTER — Telehealth: Payer: Self-pay | Admitting: Internal Medicine

## 2012-01-19 MED ORDER — FLUTICASONE-SALMETEROL 100-50 MCG/DOSE IN AEPB: 1.0000 | INHALATION_SPRAY | Freq: Two times a day (BID) | RESPIRATORY_TRACT | Status: DC

## 2012-01-19 NOTE — Telephone Encounter (Signed)
RX sent

## 2012-01-19 NOTE — Telephone Encounter (Signed)
Refill: Advair diskus 100-39mcg/inh ea. Inhale 1 puff into the lungs 2 times daily. Qty 60. Last fill 12-15-11

## 2012-01-22 ENCOUNTER — Ambulatory Visit (INDEPENDENT_AMBULATORY_CARE_PROVIDER_SITE_OTHER): Payer: Medicare Other

## 2012-01-22 VITALS — BP 138/70 | HR 92 | Wt 220.8 lb

## 2012-01-22 DIAGNOSIS — I4891 Unspecified atrial fibrillation: Secondary | ICD-10-CM | POA: Diagnosis not present

## 2012-01-22 DIAGNOSIS — Z7901 Long term (current) use of anticoagulants: Secondary | ICD-10-CM

## 2012-01-22 DIAGNOSIS — Z86718 Personal history of other venous thrombosis and embolism: Secondary | ICD-10-CM | POA: Diagnosis not present

## 2012-01-22 LAB — POCT INR: INR: 2.3

## 2012-01-22 NOTE — Patient Instructions (Addendum)
No Change in medication and recheck PT/INR in 4 weeks

## 2012-02-25 ENCOUNTER — Ambulatory Visit: Payer: Medicare Other

## 2012-03-02 ENCOUNTER — Ambulatory Visit (INDEPENDENT_AMBULATORY_CARE_PROVIDER_SITE_OTHER): Payer: Medicare Other

## 2012-03-02 VITALS — BP 126/80 | HR 72

## 2012-03-02 DIAGNOSIS — Z7901 Long term (current) use of anticoagulants: Secondary | ICD-10-CM | POA: Diagnosis not present

## 2012-03-02 DIAGNOSIS — I4891 Unspecified atrial fibrillation: Secondary | ICD-10-CM

## 2012-03-02 DIAGNOSIS — Z86718 Personal history of other venous thrombosis and embolism: Secondary | ICD-10-CM | POA: Diagnosis not present

## 2012-03-02 NOTE — Patient Instructions (Addendum)
Per Dr.Hopper no change and recheck in 4 weeks  

## 2012-03-03 ENCOUNTER — Other Ambulatory Visit: Payer: Medicare Other | Admitting: Lab

## 2012-03-03 ENCOUNTER — Ambulatory Visit: Payer: Medicare Other | Admitting: Oncology

## 2012-03-10 ENCOUNTER — Telehealth: Payer: Self-pay | Admitting: *Deleted

## 2012-03-10 NOTE — Telephone Encounter (Signed)
Pharmacy called to inform us that Pt was given a Rx for Protonix 40 mg by accident when Rx for advair was transferred from [employer]. Pharmacy notes that Pt did take this but then soon realized that it was the wrong med. Pharmacy indicated that Pt is doing fine with no reaction and Rx has since been corrected.

## 2012-03-28 ENCOUNTER — Telehealth: Payer: Self-pay | Admitting: Oncology

## 2012-03-28 ENCOUNTER — Encounter: Payer: Self-pay | Admitting: Gastroenterology

## 2012-03-28 NOTE — Telephone Encounter (Signed)
S/w the pt's dtr and she is aware of the nov appts. R/s the appts from sept due to the md's schedule

## 2012-03-30 ENCOUNTER — Other Ambulatory Visit: Payer: Self-pay | Admitting: Lab

## 2012-03-30 ENCOUNTER — Ambulatory Visit: Payer: Self-pay | Admitting: Oncology

## 2012-03-30 ENCOUNTER — Ambulatory Visit (INDEPENDENT_AMBULATORY_CARE_PROVIDER_SITE_OTHER): Payer: Medicare Other

## 2012-03-30 VITALS — HR 93 | Wt 222.0 lb

## 2012-03-30 DIAGNOSIS — Z7901 Long term (current) use of anticoagulants: Secondary | ICD-10-CM | POA: Diagnosis not present

## 2012-03-30 DIAGNOSIS — I4891 Unspecified atrial fibrillation: Secondary | ICD-10-CM

## 2012-03-30 DIAGNOSIS — Z23 Encounter for immunization: Secondary | ICD-10-CM | POA: Diagnosis not present

## 2012-03-30 DIAGNOSIS — Z86718 Personal history of other venous thrombosis and embolism: Secondary | ICD-10-CM

## 2012-03-30 MED ORDER — DILTIAZEM HCL ER COATED BEADS 300 MG PO CP24: 300.0000 mg | ORAL_CAPSULE | Freq: Every day | ORAL | Status: DC

## 2012-03-30 MED ORDER — ALPRAZOLAM 0.25 MG PO TABS: 0.2500 mg | ORAL_TABLET | Freq: Two times a day (BID) | ORAL | Status: DC | PRN

## 2012-03-30 MED ORDER — WARFARIN SODIUM 2.5 MG PO TABS: ORAL_TABLET | ORAL | Status: DC

## 2012-03-30 NOTE — Patient Instructions (Addendum)
New Regimen: 5 mg TODAY, 5 mg on Tue/Thurs, 2.5 mg daily and recheck in 3 weeks

## 2012-04-21 ENCOUNTER — Ambulatory Visit: Payer: Medicare Other

## 2012-04-28 ENCOUNTER — Ambulatory Visit (INDEPENDENT_AMBULATORY_CARE_PROVIDER_SITE_OTHER): Payer: Medicare Other

## 2012-04-28 VITALS — BP 142/80 | HR 88 | Temp 98.3°F | Wt 222.6 lb

## 2012-04-28 DIAGNOSIS — I4891 Unspecified atrial fibrillation: Secondary | ICD-10-CM

## 2012-04-28 NOTE — Patient Instructions (Addendum)
Your INR was 2.7 today, you will continue to take 5 mg coumadin on Tues and Thurs and 2.5 mg all other days and recheck in 4 weeks or sooner if needed.      KP

## 2012-05-09 ENCOUNTER — Ambulatory Visit: Payer: Medicare Other | Admitting: Oncology

## 2012-05-09 ENCOUNTER — Other Ambulatory Visit: Payer: Medicare Other | Admitting: Lab

## 2012-05-09 ENCOUNTER — Other Ambulatory Visit: Payer: Self-pay | Admitting: Internal Medicine

## 2012-05-24 ENCOUNTER — Ambulatory Visit (INDEPENDENT_AMBULATORY_CARE_PROVIDER_SITE_OTHER): Payer: Medicare Other

## 2012-05-24 VITALS — BP 140/82 | HR 87 | Wt 217.0 lb

## 2012-05-24 DIAGNOSIS — I4891 Unspecified atrial fibrillation: Secondary | ICD-10-CM

## 2012-05-24 DIAGNOSIS — Z7901 Long term (current) use of anticoagulants: Secondary | ICD-10-CM | POA: Diagnosis not present

## 2012-05-24 NOTE — Patient Instructions (Signed)
7.5 mg today, then resume regular schedule 2.5 mg daily EXCEPT 5 mg on T/TH. RECHECK IN 3 WEEKS

## 2012-05-30 ENCOUNTER — Other Ambulatory Visit: Payer: Medicare Other | Admitting: Lab

## 2012-05-30 ENCOUNTER — Ambulatory Visit: Payer: Medicare Other | Admitting: Oncology

## 2012-06-08 ENCOUNTER — Encounter: Payer: Self-pay | Admitting: Medical Oncology

## 2012-06-08 DIAGNOSIS — Z85038 Personal history of other malignant neoplasm of large intestine: Secondary | ICD-10-CM

## 2012-06-09 ENCOUNTER — Other Ambulatory Visit (HOSPITAL_BASED_OUTPATIENT_CLINIC_OR_DEPARTMENT_OTHER): Payer: Medicare Other | Admitting: Lab

## 2012-06-09 ENCOUNTER — Telehealth: Payer: Self-pay | Admitting: *Deleted

## 2012-06-09 ENCOUNTER — Encounter: Payer: Self-pay | Admitting: Adult Health

## 2012-06-09 ENCOUNTER — Ambulatory Visit (HOSPITAL_BASED_OUTPATIENT_CLINIC_OR_DEPARTMENT_OTHER): Payer: Medicare Other | Admitting: Adult Health

## 2012-06-09 ENCOUNTER — Encounter: Payer: Self-pay | Admitting: Gastroenterology

## 2012-06-09 VITALS — BP 155/80 | HR 99 | Temp 98.2°F | Resp 20 | Ht 66.0 in | Wt 214.2 lb

## 2012-06-09 DIAGNOSIS — Z85038 Personal history of other malignant neoplasm of large intestine: Secondary | ICD-10-CM

## 2012-06-09 DIAGNOSIS — C189 Malignant neoplasm of colon, unspecified: Secondary | ICD-10-CM | POA: Diagnosis not present

## 2012-06-09 LAB — COMPREHENSIVE METABOLIC PANEL (CC13)
Alkaline Phosphatase: 47 U/L (ref 40–150)
CO2: 26 mEq/L (ref 22–29)
Creatinine: 1.2 mg/dL — ABNORMAL HIGH (ref 0.6–1.1)
Glucose: 103 mg/dl — ABNORMAL HIGH (ref 70–99)
Total Bilirubin: 0.73 mg/dL (ref 0.20–1.20)

## 2012-06-09 LAB — CBC WITH DIFFERENTIAL/PLATELET
Eosinophils Absolute: 0.1 10*3/uL (ref 0.0–0.5)
HCT: 44.7 % (ref 34.8–46.6)
LYMPH%: 29.2 % (ref 14.0–49.7)
MCHC: 33.9 g/dL (ref 31.5–36.0)
MCV: 94.9 fL (ref 79.5–101.0)
MONO#: 0.5 10*3/uL (ref 0.1–0.9)
MONO%: 9.4 % (ref 0.0–14.0)
NEUT#: 2.9 10*3/uL (ref 1.5–6.5)
NEUT%: 58.7 % (ref 38.4–76.8)
Platelets: 119 10*3/uL — ABNORMAL LOW (ref 145–400)
WBC: 5 10*3/uL (ref 3.9–10.3)

## 2012-06-09 NOTE — Patient Instructions (Addendum)
Doing well.  We will see you back in one year.  Please get your colonoscopy.  Please call us if you have any questions or concerns.

## 2012-06-09 NOTE — Telephone Encounter (Signed)
Gave patient appointment for one year 2014   Called dr,jacobs office they stated something was wrong with the system they could not make an appointment with dr.jacobs she stated she will call the patient with the appointment

## 2012-06-09 NOTE — Progress Notes (Signed)
OFFICE PROGRESS NOTE  CC**  Marga Melnick, MD [phone] [facility] [address] [place] [address]  DIAGNOSIS: colon caner  PRIOR THERAPY: hemicolectomy  CURRENT THERAPY: observation  INTERVAL HISTORY: [name] 78 y.o. female returns for a follow up visit.  She was last seen a year ago, and at that time she was set up to go for a colonoscopy.  She did not have this done, and would like to se another one up.  She's doing well and without any problems, questions, or concerns.    MEDICAL HISTORY: Past Medical History  Diagnosis Date  . Anemia   . DVT (deep venous thrombosis) 11/06  . Pulmonary embolism 11/06  . Colon cancer 12/11  . Hypertension   . Atrial fibrillation     ALLERGIES:   has no known allergies.  MEDICATIONS:  Current Outpatient Prescriptions  Medication Sig Dispense Refill  . acetaminophen (TYLENOL) 500 MG tablet Take 500 mg by mouth every 6 (six) hours as needed.      . ALPRAZolam (XANAX) 0.25 MG tablet Take 1 tablet (0.25 mg total) by mouth 2 (two) times daily as needed.  30 tablet  1  . Calcium Carbonate-Vit D-Min (CALCIUM 1200 PO) Take by mouth daily.        Marland Kitchen diltiazem (CARDIZEM CD) 300 MG 24 hr capsule Take 1 capsule (300 mg total) by mouth daily.  90 capsule  2  . Fluticasone-Salmeterol (ADVAIR) 100-50 MCG/DOSE AEPB Inhale 1 puff into the lungs 2 (two) times daily.  1 each  3  . furosemide (LASIX) 40 MG tablet TAKE ONE TABLET BY MOUTH EVERY DAY  30 tablet  5  . promethazine (PHENERGAN) 25 MG tablet 1 by mouth every 6-8 hours as needed only  5 tablet  1  . traMADol (ULTRAM) 50 MG tablet TAKE ONE TABLET BY MOUTH EVERY 6 HOURS AS NEEDED FOR PAIN  30 tablet  0  . warfarin (COUMADIN) 2.5 MG tablet TAKE 1 TABLET (2.5 MG TOTAL) BY MOUTH AS DIRECTED. BASED ON PT/INR RESULTS  90 tablet  0    SURGICAL HISTORY:  Past Surgical History  Procedure Date  . Appendectomy 1952  . Total hip arthroplasty 01/03/07    Left hip replacement.  . Colectomy  06/2010    partial, Dr. Dwain Sarna complicated by LLE CVT; S/P IVC umbrella & anemia  . Total abdominal hysterectomy w/ bilateral salpingoophorectomy 1973    For Fibroids  . Vena cava filter placement 06/28/2010  . Hip fracture surgery 2006    Trauma    REVIEW OF SYSTEMS:   General: fatigue (-), night sweats (-), fever (-), pain (-) Lymph: palpable nodes (-) HEENT: vision changes (-), mucositis (-), gum bleeding (-), epistaxis (-) Cardiovascular: chest pain (-), palpitations (-) Pulmonary: shortness of breath (-), dyspnea on exertion (-), cough (-), hemoptysis (-) GI:  Early satiety (-), melena (-), dysphagia (-), nausea/vomiting (-), diarrhea (-) GU: dysuria (-), hematuria (-), incontinence (-) Musculoskeletal: joint swelling (-), joint pain (-), back pain (-) Neuro: weakness (-), numbness (-), headache (-), confusion (-) Skin: Rash (-), lesions (-), dryness (-) Psych: depression (-), suicidal/homicidal ideation (-), feeling of hopelessness (-)   PHYSICAL EXAMINATION: Blood pressure 155/80, pulse 99, temperature 98.2 F (36.8 C), temperature source Oral, resp. rate 20, height 5\' 6"  (1.676 m), weight 214 lb 3.2 oz (97.16 kg). Body mass index is 34.57 kg/(m^2). General: Patient is a well appearing female in no acute distress HEENT: PERRLA, sclerae anicteric no conjunctival  pallor, MMM Neck: supple, no palpable adenopathy Lungs: clear to auscultation bilaterally, no wheezes, rhonchi, or rales Cardiovascular: regular rate rhythm, S1, S2, no murmurs, rubs or gallops Abdomen: Soft, non-tender, non-distended, normoactive bowel sounds, no HSM Extremities: warm and well perfused, no clubbing, cyanosis, or edema Skin: No rashes or lesions Neuro: Non-focal ECOG PERFORMANCE STATUS: 1 - Symptomatic but completely ambulatory   LABORATORY DATA: Lab Results  Component Value Date   WBC 5.0 06/09/2012   HGB 15.2 06/09/2012   HCT 44.7 06/09/2012   MCV 94.9 06/09/2012   PLT 119* 06/09/2012       Chemistry      Component Value Date/Time   NA 144 06/24/2011 1709   K 4.3 06/24/2011 1709   CL 110 06/24/2011 1709   CO2 24 06/24/2011 1709   BUN 16 06/24/2011 1709   CREATININE 1.1 06/24/2011 1709      Component Value Date/Time   CALCIUM 9.5 06/24/2011 1709   ALKPHOS 45 06/24/2011 1709   AST 20 06/24/2011 1709   ALT 13 06/24/2011 1709   BILITOT 0.8 06/24/2011 1709       RADIOGRAPHIC STUDIES:  No results found.  ASSESSMENT: 78 y/o female with stage II colon carcinoma s/p hemicolectomy.     PLAN: Doing well, no evidence of recurrence.  I discussed health maintenance and survivorship issues with her.  We will see her in one year, she will call if she needs [place] sooner.  I did re-refer her to Dr. Christella Hartigan at [facility].   All questions were answered. The patient knows to call the clinic with any problems, questions or concerns. We can certainly see the patient much sooner if necessary.  I spent 25 minutes counseling the patient face to face. The total time spent in the appointment was 30 minutes.  This case was reviewed with Dr. Welton Flakes.   Cherie Ouch Lyn Hollingshead, NP Medical Oncology [facility] Phone: [phone]  06/09/2012, 3:57 PM

## 2012-06-16 ENCOUNTER — Other Ambulatory Visit: Payer: Self-pay | Admitting: Internal Medicine

## 2012-06-22 ENCOUNTER — Ambulatory Visit (INDEPENDENT_AMBULATORY_CARE_PROVIDER_SITE_OTHER): Payer: Medicare Other | Admitting: Cardiovascular Disease

## 2012-06-22 ENCOUNTER — Encounter: Payer: Self-pay | Admitting: Cardiovascular Disease

## 2012-06-22 VITALS — BP 136/90 | HR 88 | Ht 66.0 in | Wt 220.0 lb

## 2012-06-22 DIAGNOSIS — I4891 Unspecified atrial fibrillation: Secondary | ICD-10-CM | POA: Diagnosis not present

## 2012-06-22 NOTE — Patient Instructions (Addendum)
Your physician wants you to follow-up in:  6 months. You will receive a reminder letter in the mail two months in advance. If you don't receive a letter, please call our office to schedule the follow-up appointment.   

## 2012-06-22 NOTE — Progress Notes (Signed)
History of Present Illness: 78 yo female with history of paroxysmal atrial fibrillation, moderate LVH, moderate MR, dilated left atrium and right atrium, anemia, DVT/PE s/p IVC filter, colon cancer s/p colectomy here today for follow up. I saw her as a new patient for evaluation of irregular heart rhythm in February 2013. She told me today that in October of 2011 she first noticed irregular heart beats. She was admitted, found to be anemic and then her colon cancer was found. This was complicated by DVT and PE. She had an IVC filter placed in December 2011. She was started on coumadin back in 2006 after a traumatic hip injury and this was restarted in October of 2011. She has had no bleeding issues since then. Echo 10/07/11 with normal LV function, mild LVH, mild MR, severe biatrial enlargement, PA pressure 54-22mmHg.   She is here today for follow up and doing well. No chest pain, SOB, palpitations, near syncope or syncope. No bleeding issues.   Primary Care Physician: Alwyn Ren  Past Medical History  Diagnosis Date  . Anemia   . DVT (deep venous thrombosis) 11/06  . Pulmonary embolism 11/06  . Colon cancer 12/11  . Hypertension   . Atrial fibrillation     Past Surgical History  Procedure Date  . Appendectomy 1952  . Total hip arthroplasty 01/03/07    Left hip replacement.  . Colectomy 06/2010    partial, Dr. Dwain Sarna complicated by LLE CVT; S/P IVC umbrella & anemia  . Total abdominal hysterectomy w/ bilateral salpingoophorectomy 1973    For Fibroids  . Vena cava filter placement 06/28/2010  . Hip fracture surgery 2006    Trauma    Current Outpatient Prescriptions  Medication Sig Dispense Refill  . acetaminophen (TYLENOL) 500 MG tablet Take 500 mg by mouth every 6 (six) hours as needed.      . ALPRAZolam (XANAX) 0.25 MG tablet Take 1 tablet (0.25 mg total) by mouth 2 (two) times daily as needed.  30 tablet  1  . Calcium Carbonate-Vit D-Min (CALCIUM 1200 PO) Take by mouth daily.         Marland Kitchen diltiazem (CARDIZEM CD) 300 MG 24 hr capsule Take 1 capsule (300 mg total) by mouth daily.  90 capsule  2  . Fluticasone-Salmeterol (ADVAIR) 100-50 MCG/DOSE AEPB Inhale 1 puff into the lungs 2 (two) times daily.  1 each  3  . furosemide (LASIX) 40 MG tablet TAKE ONE TABLET BY MOUTH EVERY DAY  30 tablet  5  . promethazine (PHENERGAN) 25 MG tablet 1 by mouth every 6-8 hours as needed only  5 tablet  1  . traMADol (ULTRAM) 50 MG tablet TAKE ONE TABLET BY MOUTH EVERY 6 HOURS AS NEEDED FOR PAIN  30 tablet  0  . warfarin (COUMADIN) 2.5 MG tablet TAKE ONE TABLET BY MOUTH AS DIRECTED BASED ON PT/INR RESULTS  90 tablet  0    No Known Allergies  History   Social History  . Marital Status: Widowed    Spouse Name: N/A    Number of Children: 1  . Years of Education: N/A   Occupational History  .     Social History Main Topics  . Smoking status: Never Smoker   . Smokeless tobacco: Not on file  . Alcohol Use: No  . Drug Use: No  . Sexually Active: Not on file   Other Topics Concern  . Not on file   Social History Narrative  . No narrative on file  Family History  Problem Relation Age of Onset  . Peripheral vascular disease Father   . Heart attack Mother 49  . Heart disease Mother     MI  . Coronary artery disease Sister   . Diabetes Paternal Grandmother   . Coronary artery disease Maternal Grandfather     Review of Systems:  As stated in the HPI and otherwise negative.   BP 136/90  Pulse 88  Ht 5\' 6"  (1.676 m)  Wt 220 lb (99.791 kg)  BMI 35.51 kg/m2  Physical Examination: General: Well developed, well nourished, NAD HEENT: OP clear, mucus membranes moist SKIN: warm, dry. No rashes. Neuro: No focal deficits Musculoskeletal: Muscle strength 5/5 all ext Psychiatric: Mood and affect normal Neck: No JVD, no carotid bruits, no thyromegaly, no lymphadenopathy. Lungs:Clear bilaterally, no wheezes, rhonci, crackles Cardiovascular: Irregular irregular. No murmurs,  gallops or rubs. Abdomen:Soft. Bowel sounds present. Non-tender.  Extremities: No lower extremity edema. Pulses are 2 + in the bilateral DP/PT.  Assessment and Plan:   1. ATRIAL FIBRILLATION: Rate controlled. Continue Cardizem CD for now. She has been notified by her insurance company that she should seek a cheaper alternative to Cardizem. I have asked her to bring her list of approved medications to her f/u with Dr. Alwyn Ren later this week. She could be changed to another Calcium channel blocker or a beta blocker. Continue anti-coagulation with coumadin.  No bleeding issues.

## 2012-06-24 ENCOUNTER — Encounter: Payer: Self-pay | Admitting: Lab

## 2012-06-24 ENCOUNTER — Ambulatory Visit (INDEPENDENT_AMBULATORY_CARE_PROVIDER_SITE_OTHER): Payer: Medicare Other | Admitting: Internal Medicine

## 2012-06-24 ENCOUNTER — Encounter: Payer: Self-pay | Admitting: Internal Medicine

## 2012-06-24 VITALS — BP 130/78 | HR 111 | Wt 217.6 lb

## 2012-06-24 DIAGNOSIS — Z7901 Long term (current) use of anticoagulants: Secondary | ICD-10-CM | POA: Diagnosis not present

## 2012-06-24 DIAGNOSIS — I10 Essential (primary) hypertension: Secondary | ICD-10-CM | POA: Diagnosis not present

## 2012-06-24 DIAGNOSIS — Z79899 Other long term (current) drug therapy: Secondary | ICD-10-CM | POA: Diagnosis not present

## 2012-06-24 DIAGNOSIS — I4891 Unspecified atrial fibrillation: Secondary | ICD-10-CM | POA: Diagnosis not present

## 2012-06-24 MED ORDER — VERAPAMIL HCL 120 MG PO TABS: ORAL_TABLET | ORAL | Status: DC

## 2012-06-24 NOTE — Progress Notes (Signed)
  Subjective:    Patient ID: [name], female    DOB: [birth year], 78 y.o.   MRN: [id number]  HPI BP not checked; old cuff broke & was not replaced.Diltiazem CD is post prohibitive on her plan. Her cardiologist did not recommend changing the warfarin to one of the newer oral agents.    Review of Systems She denies chest pain, palpitations, tachycardia, or claudication. She has occasional edema. Recently there was increased edema as she had not taken her diuretic while traveling.  She remains on the warfarin; PT/INR is therapeutic today @ 2.9     Objective:   Physical Exam  She appears healthy and well-nourished;she is in no acute distress  No carotid bruits are present.  Heart rhythm and rate are irregular;no significant murmurs or gallops.  Chest is clear with no increased work of breathing  There is no evidence of aortic aneurysm or renal artery bruits  She has no clubbing. 1+ edema to mid shin.   Pedal pulses are intact   No ischemic skin changes are present         Assessment & Plan:  #1 hypertension, well controlled. Insurance issues will necessitate changing to a twice a day form of a calcium channel blocker from the sustained release format.  #2 atrial fibrillation with variable rate (pulse recheck 92)  Plan: see orders

## 2012-06-24 NOTE — Patient Instructions (Addendum)
Blood Pressure Goal = AVERAGE < 140/90; Ideal is an AVERAGE < 135/85. This AVERAGE should be calculated from @ least 5-7 BP readings taken @ different times of day on different days of week. You should not respond to isolated BP readings , but rather the AVERAGE for that week.   If you activate My Chart; the results can be released to you as soon as they populate from the lab. If you choose not to use this program; the labs have to be reviewed, copied & mailed   causing a delay in getting the results to you.  

## 2012-06-30 ENCOUNTER — Ambulatory Visit: Payer: Medicare Other | Admitting: Gastroenterology

## 2012-07-01 ENCOUNTER — Other Ambulatory Visit: Payer: Self-pay | Admitting: Internal Medicine

## 2012-07-01 NOTE — Telephone Encounter (Signed)
Refill done.  

## 2012-07-29 ENCOUNTER — Encounter: Payer: Self-pay | Admitting: Internal Medicine

## 2012-08-18 ENCOUNTER — Ambulatory Visit: Payer: Medicare Other

## 2012-08-25 ENCOUNTER — Other Ambulatory Visit: Payer: Self-pay | Admitting: Internal Medicine

## 2012-08-25 ENCOUNTER — Ambulatory Visit: Payer: Medicare Other

## 2012-08-26 ENCOUNTER — Other Ambulatory Visit: Payer: Self-pay | Admitting: Internal Medicine

## 2012-08-30 ENCOUNTER — Ambulatory Visit (INDEPENDENT_AMBULATORY_CARE_PROVIDER_SITE_OTHER): Payer: Medicare Other | Admitting: *Deleted

## 2012-08-30 VITALS — BP 118/82 | Wt 218.0 lb

## 2012-08-30 DIAGNOSIS — I4891 Unspecified atrial fibrillation: Secondary | ICD-10-CM | POA: Diagnosis not present

## 2012-08-30 DIAGNOSIS — Z7901 Long term (current) use of anticoagulants: Secondary | ICD-10-CM | POA: Diagnosis not present

## 2012-08-30 LAB — POCT INR: INR: 2.3

## 2012-08-30 NOTE — Patient Instructions (Signed)
Return to office in 4 weeks  Continue current dosing:  5 mg on Tue/Th and 2.5 all other days

## 2012-09-22 ENCOUNTER — Other Ambulatory Visit: Payer: Self-pay | Admitting: Internal Medicine

## 2012-09-28 ENCOUNTER — Ambulatory Visit: Payer: Medicare Other

## 2012-10-05 ENCOUNTER — Ambulatory Visit (INDEPENDENT_AMBULATORY_CARE_PROVIDER_SITE_OTHER): Payer: Medicare Other

## 2012-10-05 VITALS — BP 122/74 | HR 87 | Temp 97.6°F | Wt 218.0 lb

## 2012-10-05 DIAGNOSIS — I4891 Unspecified atrial fibrillation: Secondary | ICD-10-CM | POA: Diagnosis not present

## 2012-10-05 LAB — POCT INR: INR: 2.5

## 2012-10-05 NOTE — Patient Instructions (Addendum)
Your INR today was 2.5 which was normal. Continue to take 5 mg of coumadin on Tues and Thurs and 2.5 mg all other days and recheck INR in 1 month.     KP

## 2012-10-24 ENCOUNTER — Other Ambulatory Visit: Payer: Self-pay | Admitting: Internal Medicine

## 2012-10-26 ENCOUNTER — Other Ambulatory Visit: Payer: Self-pay | Admitting: Internal Medicine

## 2012-11-18 ENCOUNTER — Ambulatory Visit (INDEPENDENT_AMBULATORY_CARE_PROVIDER_SITE_OTHER): Payer: Medicare Other | Admitting: Internal Medicine

## 2012-11-18 ENCOUNTER — Encounter: Payer: Self-pay | Admitting: Internal Medicine

## 2012-11-18 VITALS — BP 156/90 | HR 91 | Wt 214.0 lb

## 2012-11-18 DIAGNOSIS — M5137 Other intervertebral disc degeneration, lumbosacral region: Secondary | ICD-10-CM | POA: Diagnosis not present

## 2012-11-18 DIAGNOSIS — I10 Essential (primary) hypertension: Secondary | ICD-10-CM

## 2012-11-18 DIAGNOSIS — Z86718 Personal history of other venous thrombosis and embolism: Secondary | ICD-10-CM | POA: Diagnosis not present

## 2012-11-18 DIAGNOSIS — I4891 Unspecified atrial fibrillation: Secondary | ICD-10-CM | POA: Diagnosis not present

## 2012-11-18 DIAGNOSIS — Z7901 Long term (current) use of anticoagulants: Secondary | ICD-10-CM | POA: Diagnosis not present

## 2012-11-18 DIAGNOSIS — M5136 Other intervertebral disc degeneration, lumbar region: Secondary | ICD-10-CM

## 2012-11-18 DIAGNOSIS — Z85038 Personal history of other malignant neoplasm of large intestine: Secondary | ICD-10-CM

## 2012-11-18 LAB — BASIC METABOLIC PANEL
CO2: 24 mEq/L (ref 19–32)
Chloride: 107 mEq/L (ref 96–112)
Potassium: 3.7 mEq/L (ref 3.5–5.3)
Sodium: 143 mEq/L (ref 135–145)

## 2012-11-18 MED ORDER — FENTANYL 25 MCG/HR TD PT72: 1.0000 | MEDICATED_PATCH | TRANSDERMAL | Status: DC

## 2012-11-18 MED ORDER — METOPROLOL TARTRATE 25 MG PO TABS: 25.0000 mg | ORAL_TABLET | Freq: Two times a day (BID) | ORAL | Status: DC

## 2012-11-18 MED ORDER — WARFARIN SODIUM 2.5 MG PO TABS: ORAL_TABLET | ORAL | Status: DC

## 2012-11-18 NOTE — Patient Instructions (Addendum)
Order for x-rays entered into  the computer; these will be performed at [address] across from [facility]. No appointment is necessary. 

## 2012-11-18 NOTE — Assessment & Plan Note (Signed)
Low-dose metoprolol will be added for hypertension as well as atrial fibrillation.

## 2012-11-18 NOTE — Assessment & Plan Note (Signed)
Rate is elevated; metoprolol will be added.

## 2012-11-18 NOTE — Progress Notes (Signed)
  Subjective:    Patient ID: [name], female    DOB: Aug 15, 1926, 79 y.o.   MRN: [id number]  HPI  HYPERTENSION follow-up:  Home blood pressure  not monitored  Patient is compliant with medications  No adverse effects noted from medication  No exercise program due to back pain  On low-salt diet    No chest pain, palpitations, dyspnea, claudication,edema or paroxysmal nocturnal dyspnea described Some lightheadedness & headache. No epistaxis or syncope  The light headedness can be postural; her daughter also questions whether is related to taking her verapamil 120 mg blood pressure medicine  early in the day.  Her last renal function revealed a creatinine of 1.2 in December of 2013.         Review of Systems  The headaches are described as frontal and dull. These respond to Tylenol  Her back pain varies from dull to sharp & is not associated with weakness, numbness or tingling in the extremities. She denies significant incontinence of urine or stool at this time.Dr [name] administered epidural steroid with good response previously for DDD     Objective:   Physical Exam Appears well-nourished & in no acute distress.Appears younger than stated age  No carotid bruits are present.No neck pain distention present sitting. Thyroid normal to palpation  Heart rhythm  irregular  & rate > 100 with no significant murmurs or gallops.  Chest is clear with no increased work of breathing  There is no evidence of aortic aneurysm or renal artery bruits  Abdomen soft with no organomegaly or masses. No HJR  No clubbing, cyanosis or edema present. Employing walker  Pedal pulses are decreased  No ischemic skin changes are present . Nails  Healthy   Alert and oriented. Strength, tone normal in lower extremities toopposition        Assessment & Plan:  See Current Assessment & Plan in Problem List under specific Diagnosis

## 2012-11-19 NOTE — Assessment & Plan Note (Signed)
Tramadol ineffective for significant pain in very stoic individual R/O malignant process in context of PMH colon cancer Fentanyl patch & imaging

## 2012-12-13 ENCOUNTER — Other Ambulatory Visit: Payer: Self-pay | Admitting: Internal Medicine

## 2012-12-27 ENCOUNTER — Encounter: Payer: Self-pay | Admitting: Cardiovascular Disease

## 2012-12-27 ENCOUNTER — Ambulatory Visit (INDEPENDENT_AMBULATORY_CARE_PROVIDER_SITE_OTHER): Payer: Medicare Other | Admitting: Cardiovascular Disease

## 2012-12-27 VITALS — BP 142/72 | HR 74 | Ht 65.0 in | Wt 211.0 lb

## 2012-12-27 DIAGNOSIS — I4891 Unspecified atrial fibrillation: Secondary | ICD-10-CM | POA: Diagnosis not present

## 2012-12-27 NOTE — Patient Instructions (Addendum)
Your physician wants you to follow-up in:  12 months.  You will receive a reminder letter in the mail two months in advance. If you don't receive a letter, please call our office to schedule the follow-up appointment.   

## 2012-12-27 NOTE — Progress Notes (Signed)
History of Present Illness: 79 yo female with history of paroxysmal atrial fibrillation, moderate LVH, mild MR, dilated left atrium and right atrium, anemia, DVT/PE s/p IVC filter, colon cancer s/p colectomy here today for follow up. I saw her as a new patient for evaluation of irregular heart rhythm in February 2013. She told me today that in October of 2011 she first noticed irregular heart beats. She was admitted, found to be anemic and then her colon cancer was found. This was complicated by DVT and PE. She had an IVC filter placed in December 2011. She was started on coumadin back in 2006 after a traumatic hip injury and this was restarted in October of 2011. She has had no bleeding issues since then. Echo 10/07/11 with normal LV function, mild LVH, mild MR, severe biatrial enlargement, PA pressure 54-75mmHg.   She is here today for follow up and doing well. No chest pain, SOB, palpitations, near syncope or syncope. No bleeding issues.   Primary Care Physician: Alwyn Ren  Last Lipid Profile:Lipid Panel     Component Value Date/Time   CHOL  Value: 101        ATP III CLASSIFICATION:  <200     mg/dL   Desirable  578-469  mg/dL   Borderline High  >=629    mg/dL   High        52/02/4131 0400   TRIG 54 06/12/2010 0400   HDL 24* 06/12/2010 0400   CHOLHDL 4.2 06/12/2010 0400   VLDL 11 06/12/2010 0400   LDLCALC  Value: 66        Total Cholesterol/HDL:CHD Risk Coronary Heart Disease Risk Table                     Men   Women  1/2 Average Risk   3.4   3.3  Average Risk       5.0   4.4  2 X Average Risk   9.6   7.1  3 X Average Risk  23.4   11.0        Use the calculated Patient Ratio above and the CHD Risk Table to determine the patient's CHD Risk.        ATP III CLASSIFICATION (LDL):  <100     mg/dL   Optimal  440-102  mg/dL   Near or Above                    Optimal  130-159  mg/dL   Borderline  725-366  mg/dL   High  >440     mg/dL   Very High 34/01/4258 5638     Past Medical History  Diagnosis Date  .  Anemia   . DVT (deep venous thrombosis) 11/06  . Pulmonary embolism 11/06  . Colon cancer 12/11  . Hypertension   . Atrial fibrillation     Past Surgical History  Procedure Laterality Date  . Appendectomy  1952  . Total hip arthroplasty  01/03/07    Left hip replacement.  . Colectomy  06/2010    partial, Dr. Dwain Sarna complicated by LLE CVT; S/P IVC umbrella & anemia  . Total abdominal hysterectomy w/ bilateral salpingoophorectomy  1973    For Fibroids  . Vena cava filter placement  06/28/2010  . Hip fracture surgery  2006    Trauma    Current Outpatient Prescriptions  Medication Sig Dispense Refill  . acetaminophen (TYLENOL) 500 MG tablet Take 500 mg by mouth every 6 (  six) hours as needed.      Marland Kitchen ADVAIR DISKUS 100-50 MCG/DOSE AEPB INHALE ONE DOSE BY MOUTH TWICE DAILY  60 each  5  . ALPRAZolam (XANAX) 0.25 MG tablet TAKE ONE TABLET BY MOUTH TWICE DAILY AS NEEDED  30 tablet  0  . Calcium Carbonate-Vit D-Min (CALCIUM 1200 PO) Take by mouth daily.        . fentaNYL (DURAGESIC - DOSED MCG/HR) 25 MCG/HR Place 1 patch (25 mcg total) onto the skin every 3 (three) days.  5 patch  0  . furosemide (LASIX) 40 MG tablet TAKE ONE TABLET BY MOUTH EVERY DAY  30 tablet  5  . metoprolol tartrate (LOPRESSOR) 25 MG tablet Take 1 tablet (25 mg total) by mouth 2 (two) times daily.  60 tablet  2  . promethazine (PHENERGAN) 25 MG tablet TAKE ONE TABLET BY MOUTH EVERY 6 TO 8 HOURS AS NEEDED  5 tablet  0  . traMADol (ULTRAM) 50 MG tablet TAKE ONE TABLET BY MOUTH EVERY 6 HOURS AS NEEDED FOR PAIN  30 tablet  0  . verapamil (CALAN) 120 MG tablet 120 mg. One in the am and one in the pm      . warfarin (COUMADIN) 2.5 MG tablet Take 2.5 mg by mouth. TAKE EVERY DAY EXCEPT TUESDAY AND THURSDAY.      Marland Kitchen warfarin (COUMADIN) 5 MG tablet Take 5 mg by mouth. 5 MG ON Tuesday AND Thursday ONLY.       No current facility-administered medications for this visit.    No Known Allergies  History   Social History  .  Marital Status: Widowed    Spouse Name: N/A    Number of Children: 1  . Years of Education: N/A   Occupational History  .     Social History Main Topics  . Smoking status: Never Smoker   . Smokeless tobacco: Not on file  . Alcohol Use: No  . Drug Use: No  . Sexually Active: Not on file   Other Topics Concern  . Not on file   Social History Narrative  . No narrative on file    Family History  Problem Relation Age of Onset  . Peripheral vascular disease Father   . Heart attack Mother 72  . Heart disease Mother     MI  . Coronary artery disease Sister   . Diabetes Paternal Grandmother   . Coronary artery disease Maternal Grandfather     Review of Systems:  As stated in the HPI and otherwise negative.   BP 142/72  Pulse 74  Ht 5\' 5"  (1.651 m)  Wt 211 lb (95.709 kg)  BMI 35.11 kg/m2  Physical Examination: General: Well developed, well nourished, NAD HEENT: OP clear, mucus membranes moist SKIN: warm, dry. No rashes. Neuro: No focal deficits Musculoskeletal: Muscle strength 5/5 all ext Psychiatric: Mood and affect normal Neck: No JVD, no carotid bruits, no thyromegaly, no lymphadenopathy. Lungs:Clear bilaterally, no wheezes, rhonci, crackles Cardiovascular: Irregular irregular. No murmurs, gallops or rubs. Abdomen:Soft. Bowel sounds present. Non-tender.  Extremities: No lower extremity edema. Pulses are 2 + in the bilateral DP/PT.  EKG: Atrial fibrillation, rate 69 bpm.   Assessment and Plan:   1. ATRIAL FIBRILLATION: Rate controlled. Cardizem CD stopped for insurance reasons and Verapamil/Toprol now for rate control. Continue anti-coagulation with coumadin. No bleeding issues.

## 2012-12-30 ENCOUNTER — Ambulatory Visit (INDEPENDENT_AMBULATORY_CARE_PROVIDER_SITE_OTHER): Payer: Medicare Other | Admitting: *Deleted

## 2012-12-30 VITALS — HR 85 | Temp 98.6°F | Wt 211.0 lb

## 2012-12-30 DIAGNOSIS — I4891 Unspecified atrial fibrillation: Secondary | ICD-10-CM

## 2012-12-30 DIAGNOSIS — Z7901 Long term (current) use of anticoagulants: Secondary | ICD-10-CM | POA: Diagnosis not present

## 2012-12-30 DIAGNOSIS — Z86718 Personal history of other venous thrombosis and embolism: Secondary | ICD-10-CM

## 2012-12-30 NOTE — Patient Instructions (Signed)
Take 5mg  today and resume normal dosing. Recheck INR in 4 weeks.

## 2013-01-27 ENCOUNTER — Other Ambulatory Visit: Payer: Self-pay | Admitting: Internal Medicine

## 2013-01-31 ENCOUNTER — Ambulatory Visit (INDEPENDENT_AMBULATORY_CARE_PROVIDER_SITE_OTHER): Payer: Medicare Other

## 2013-01-31 VITALS — BP 130/86 | HR 89 | Temp 98.4°F | Wt 214.2 lb

## 2013-01-31 DIAGNOSIS — I059 Rheumatic mitral valve disease, unspecified: Secondary | ICD-10-CM

## 2013-01-31 DIAGNOSIS — I340 Nonrheumatic mitral (valve) insufficiency: Secondary | ICD-10-CM

## 2013-01-31 LAB — POCT INR: INR: 1.6

## 2013-01-31 NOTE — Patient Instructions (Signed)
Take 5mg  M-W-F, all other days 2.5mg . Take today's 5mg  dose and begin new instructions tomorrow.  If you have any questions, please call!!  Nice to meet you and enjoy the rest of your day!

## 2013-02-02 ENCOUNTER — Other Ambulatory Visit: Payer: Self-pay | Admitting: Internal Medicine

## 2013-02-03 NOTE — Telephone Encounter (Signed)
I called the pharmacy and spoke with Gabriel Rung, RX was not received on 01/27/13. RX given to pharmacist

## 2013-02-14 ENCOUNTER — Other Ambulatory Visit: Payer: Self-pay | Admitting: Internal Medicine

## 2013-02-22 ENCOUNTER — Ambulatory Visit: Payer: Medicare Other

## 2013-02-24 ENCOUNTER — Ambulatory Visit: Payer: Medicare Other

## 2013-02-27 ENCOUNTER — Ambulatory Visit: Payer: Medicare Other

## 2013-03-01 ENCOUNTER — Ambulatory Visit (INDEPENDENT_AMBULATORY_CARE_PROVIDER_SITE_OTHER): Payer: Medicare Other | Admitting: *Deleted

## 2013-03-01 VITALS — BP 140/70 | HR 71 | Temp 97.7°F | Wt 209.8 lb

## 2013-03-01 DIAGNOSIS — Z7901 Long term (current) use of anticoagulants: Secondary | ICD-10-CM | POA: Diagnosis not present

## 2013-03-01 DIAGNOSIS — I059 Rheumatic mitral valve disease, unspecified: Secondary | ICD-10-CM | POA: Diagnosis not present

## 2013-03-01 LAB — POCT INR: INR: 4.5

## 2013-03-01 NOTE — Patient Instructions (Addendum)
Dr Beverely Low would like pt to take 2.5mg  daily  Then recheck in 2wks.

## 2013-03-03 ENCOUNTER — Telehealth: Payer: Self-pay | Admitting: *Deleted

## 2013-03-03 NOTE — Telephone Encounter (Signed)
sw pt informed the pt on 06/14/13 in the pm kk will be in [facility]. gv appts for 06/14/13 w/ labs@10 :30am and ov@ 11am. Pt is aware that i will mail her a letter/avs...td

## 2013-03-10 ENCOUNTER — Telehealth: Payer: Self-pay | Admitting: Oncology

## 2013-03-15 ENCOUNTER — Ambulatory Visit (INDEPENDENT_AMBULATORY_CARE_PROVIDER_SITE_OTHER): Payer: Medicare Other

## 2013-03-15 VITALS — BP 138/82 | HR 77 | Temp 97.9°F | Wt 209.4 lb

## 2013-03-15 DIAGNOSIS — I4891 Unspecified atrial fibrillation: Secondary | ICD-10-CM

## 2013-03-15 LAB — POCT INR: INR: 2

## 2013-03-15 NOTE — Patient Instructions (Addendum)
Take 5mg  today and 2.5mg  all other days until next week resume 5 mg Tues and Thurs and 2.5mg  all other days. Recheck in 3 weeks.  Good to see you and enjoy dinner.

## 2013-03-24 ENCOUNTER — Other Ambulatory Visit: Payer: Self-pay | Admitting: Internal Medicine

## 2013-03-29 NOTE — Telephone Encounter (Signed)
Requesting refill on Xanax 0.25mg  Last refill:11-09-12 Last OV:11-18-12 UDS:06-24-12-Low risk-Due UDS Please advise.//AB/CMA

## 2013-04-05 ENCOUNTER — Ambulatory Visit: Payer: Medicare Other

## 2013-04-12 ENCOUNTER — Ambulatory Visit: Payer: Medicare Other

## 2013-04-14 ENCOUNTER — Ambulatory Visit: Payer: Medicare Other

## 2013-04-18 ENCOUNTER — Ambulatory Visit: Payer: Medicare Other

## 2013-04-18 ENCOUNTER — Other Ambulatory Visit: Payer: Self-pay | Admitting: Internal Medicine

## 2013-04-19 ENCOUNTER — Other Ambulatory Visit: Payer: Self-pay | Admitting: *Deleted

## 2013-04-20 ENCOUNTER — Ambulatory Visit (INDEPENDENT_AMBULATORY_CARE_PROVIDER_SITE_OTHER): Payer: Medicare Other | Admitting: *Deleted

## 2013-04-20 VITALS — BP 134/84 | HR 88 | Temp 97.9°F | Wt 211.4 lb

## 2013-04-20 DIAGNOSIS — Z7901 Long term (current) use of anticoagulants: Secondary | ICD-10-CM

## 2013-04-20 DIAGNOSIS — I4891 Unspecified atrial fibrillation: Secondary | ICD-10-CM

## 2013-04-20 DIAGNOSIS — Z23 Encounter for immunization: Secondary | ICD-10-CM | POA: Diagnosis not present

## 2013-04-20 DIAGNOSIS — Z5181 Encounter for therapeutic drug level monitoring: Secondary | ICD-10-CM

## 2013-04-20 MED ORDER — WARFARIN SODIUM 2.5 MG PO TABS: 2.5000 mg | ORAL_TABLET | Freq: Every day | ORAL | Status: DC

## 2013-04-20 NOTE — Patient Instructions (Signed)
2.5mg on all days except on Tuesday and Thursday take 5mg Return 4 in weeks   

## 2013-05-13 ENCOUNTER — Other Ambulatory Visit: Payer: Self-pay | Admitting: Internal Medicine

## 2013-05-15 NOTE — Telephone Encounter (Signed)
Advair refill sent to pharmacy 

## 2013-05-24 ENCOUNTER — Ambulatory Visit: Payer: Medicare Other

## 2013-05-30 ENCOUNTER — Ambulatory Visit (INDEPENDENT_AMBULATORY_CARE_PROVIDER_SITE_OTHER): Payer: Medicare Other | Admitting: *Deleted

## 2013-05-30 VITALS — BP 120/70 | HR 75 | Wt 177.8 lb

## 2013-05-30 DIAGNOSIS — I4891 Unspecified atrial fibrillation: Secondary | ICD-10-CM

## 2013-05-30 DIAGNOSIS — Z7901 Long term (current) use of anticoagulants: Secondary | ICD-10-CM

## 2013-05-30 LAB — POCT INR: INR: 2.7

## 2013-06-07 ENCOUNTER — Telehealth: Payer: Self-pay | Admitting: Oncology

## 2013-06-07 NOTE — Telephone Encounter (Signed)
, °

## 2013-06-14 ENCOUNTER — Other Ambulatory Visit: Payer: Medicare Other | Admitting: Lab

## 2013-06-14 ENCOUNTER — Ambulatory Visit: Payer: Medicare Other | Admitting: Oncology

## 2013-06-22 ENCOUNTER — Other Ambulatory Visit: Payer: Medicare Other | Admitting: Lab

## 2013-06-22 ENCOUNTER — Ambulatory Visit: Payer: Medicare Other | Admitting: Oncology

## 2013-06-26 ENCOUNTER — Other Ambulatory Visit: Payer: Self-pay | Admitting: Internal Medicine

## 2013-06-27 ENCOUNTER — Encounter: Payer: Self-pay | Admitting: Internal Medicine

## 2013-06-27 ENCOUNTER — Ambulatory Visit (INDEPENDENT_AMBULATORY_CARE_PROVIDER_SITE_OTHER): Payer: Medicare Other | Admitting: Internal Medicine

## 2013-06-27 ENCOUNTER — Ambulatory Visit (INDEPENDENT_AMBULATORY_CARE_PROVIDER_SITE_OTHER): Payer: Medicare Other | Admitting: *Deleted

## 2013-06-27 VITALS — BP 118/78 | HR 86 | Temp 98.3°F

## 2013-06-27 VITALS — BP 118/78 | HR 86 | Temp 98.3°F | Wt 208.8 lb

## 2013-06-27 DIAGNOSIS — Z86711 Personal history of pulmonary embolism: Secondary | ICD-10-CM | POA: Diagnosis not present

## 2013-06-27 DIAGNOSIS — Z5181 Encounter for therapeutic drug level monitoring: Secondary | ICD-10-CM | POA: Diagnosis not present

## 2013-06-27 DIAGNOSIS — I4891 Unspecified atrial fibrillation: Secondary | ICD-10-CM

## 2013-06-27 DIAGNOSIS — Z7901 Long term (current) use of anticoagulants: Secondary | ICD-10-CM

## 2013-06-27 DIAGNOSIS — R233 Spontaneous ecchymoses: Secondary | ICD-10-CM

## 2013-06-27 LAB — POCT INR: INR: 2.7

## 2013-06-27 MED ORDER — WARFARIN SODIUM 5 MG PO TABS: ORAL_TABLET | ORAL | Status: DC

## 2013-06-27 NOTE — Patient Instructions (Signed)
2.5mg  on all days except on Tuesday and Thursday take 5mg  Return 4 in weeks

## 2013-06-27 NOTE — Telephone Encounter (Signed)
Furosemide and Warfarin refilled per protocol. JG//CMA

## 2013-06-27 NOTE — Patient Instructions (Signed)
Use warm moist compresses to 3-4  times a day to the affected area & keep arm elevated as much as possible. PT/INR is therapeutic. No change in warfarin dose; repeat PT/INR in 4 weeks

## 2013-06-27 NOTE — Progress Notes (Signed)
   Subjective:    Patient ID: [name], female    DOB: October 23, 1926, 79 y.o.   MRN: [id number]  HPI  She developed extensive ecchymosis of the right upper extremity 2 weeks ago in the context of writing over a prolonged period time. The ecchymosis has resolved without treatment except for residual area over the right forearm and a large hematoma of the dorsum of the right hand. The hematoma is tender to touch.  She is on lifelong warfarin because of the venous thrombosis on 2 occasions. The first was associated with pulmonary thromboemboli. The second episode venous thrombosis  was in the context of atrial fibrillation      Review of Systems  She denies fever, chills, or sweats. Other than easy bruising; she has no other bleeding dyscrasias. Specifically she denies epistaxis, hemoptysis, hematemesis, melena, or rectal bleeding.     Objective:   Physical Exam Gen.:  well-nourished in appearance. Alert, appropriate and cooperative throughout exam. Eyes: no icterus  Neck: No deformities, masses, or tenderness noted.  Lungs: Normal respiratory effort; chest expands symmetrically. Lungs are clear to auscultation without rales, wheezes, or increased work of breathing. Heart: Slightly irregular  rate and rhythm. Normal S1 and S2. No gallop, click, or rub. Flow murmur.                                 Musculoskeletal/extremities:   Accentuated curvature of upper thoracic spine.  No clubbing, cyanosis, edema, or significant extremity  deformity noted. Range of motion normal .Tone & strength normal. Hand joints  reveal marked PIP/ DIP changes. Fingernail  health good. There is no pain with range of motion of the right wrist.  Vascular: Carotid, radial artery, dorsalis pedis and  posterior tibial pulses are  equal.  Decreased DPP.No bruits present. Neurologic: Alert and oriented x3. Rolling walker employed     Skin: Intact without suspicious lesions or rashes. 5 x 5.5 flexion hematoma dorsum  right him. This is slightly tender to palpation. There is an area of ecchymosis proximal to this 15 x 6.5 cm. Lymph: No cervical, axillary, or epitrochlear lymphadenopathy present. Psych: Mood and affect are normal. Normally interactive                                                                                        Assessment & Plan:  #1 hematoma dorsum of right hand. This may require evacuation.  #2 chronic anticoagulation; her PT/INR is excellent at 2.7. He  Plan: No change in warfarin. Elevation warm ,moist compresses with referral to the wound care center if no better.

## 2013-06-30 ENCOUNTER — Telehealth: Payer: Self-pay | Admitting: *Deleted

## 2013-06-30 NOTE — Telephone Encounter (Signed)
Patient called and stated that she does not need a referral to the wound center because she is doing better now.

## 2013-07-03 ENCOUNTER — Other Ambulatory Visit: Payer: Self-pay | Admitting: *Deleted

## 2013-07-03 ENCOUNTER — Telehealth: Payer: Self-pay | Admitting: *Deleted

## 2013-07-03 MED ORDER — TRAMADOL HCL 50 MG PO TABS: ORAL_TABLET | ORAL | Status: DC

## 2013-07-03 NOTE — Telephone Encounter (Signed)
traMADol (ULTRAM) 50 MG tablet Last refill: 01/27/13 #30, 0 refills Last OV: 06/27/2013 Very low risk, contract on file

## 2013-07-03 NOTE — Telephone Encounter (Signed)
Tramadol refilled and faxed to patient's pharmacy. JG//CMA

## 2013-07-03 NOTE — Telephone Encounter (Signed)
OK X1, R X 2 

## 2013-08-02 ENCOUNTER — Ambulatory Visit: Payer: Medicare Other

## 2013-08-07 ENCOUNTER — Other Ambulatory Visit: Payer: Self-pay | Admitting: Internal Medicine

## 2013-08-08 NOTE — Telephone Encounter (Signed)
ALPRAZolam (XANAX) 0.25 MG tablet Last refill: 03/24/13 #30, 0 refills Last OV: 06/27/13 UDS up-to-date, low risk

## 2013-08-08 NOTE — Telephone Encounter (Signed)
Rx printed and faxed to the pharmacy.//AB/CMA 

## 2013-08-08 NOTE — Telephone Encounter (Signed)
OK X1 

## 2013-08-14 ENCOUNTER — Ambulatory Visit: Payer: Medicare Other | Admitting: Oncology

## 2013-08-14 ENCOUNTER — Other Ambulatory Visit: Payer: Medicare Other

## 2013-08-15 ENCOUNTER — Telehealth: Payer: Self-pay | Admitting: Oncology

## 2013-08-15 NOTE — Telephone Encounter (Signed)
, °

## 2013-08-17 ENCOUNTER — Ambulatory Visit: Payer: Medicare Other | Admitting: Oncology

## 2013-08-17 ENCOUNTER — Other Ambulatory Visit: Payer: Medicare Other

## 2013-08-18 ENCOUNTER — Telehealth: Payer: Self-pay | Admitting: Internal Medicine

## 2013-08-18 MED ORDER — METOPROLOL TARTRATE 25 MG PO TABS: ORAL_TABLET | ORAL | Status: DC

## 2013-08-18 NOTE — Telephone Encounter (Signed)
Rx sent to the pharmacy by e-script.//AB/CMA 

## 2013-08-18 NOTE — Telephone Encounter (Signed)
Patient will be out of her metoprolol tartrate (LOPRESSOR) 25 MG tablet rx after taking it today and needs a refill. Requests have been sent to [place] location for a few days. Please advise.

## 2013-08-30 ENCOUNTER — Ambulatory Visit (INDEPENDENT_AMBULATORY_CARE_PROVIDER_SITE_OTHER): Payer: Medicare Other | Admitting: General Practice

## 2013-08-30 DIAGNOSIS — I4891 Unspecified atrial fibrillation: Secondary | ICD-10-CM | POA: Diagnosis not present

## 2013-08-30 DIAGNOSIS — Z5181 Encounter for therapeutic drug level monitoring: Secondary | ICD-10-CM

## 2013-08-30 DIAGNOSIS — Z0000 Encounter for general adult medical examination without abnormal findings: Secondary | ICD-10-CM | POA: Insufficient documentation

## 2013-08-30 LAB — POCT INR: INR: 2.8

## 2013-08-30 NOTE — Progress Notes (Signed)
Pre visit review using our clinic review tool, if applicable. No additional management support is needed unless otherwise documented below in the visit note. 

## 2013-09-27 ENCOUNTER — Ambulatory Visit: Payer: Medicare Other

## 2013-10-03 ENCOUNTER — Other Ambulatory Visit: Payer: Self-pay | Admitting: *Deleted

## 2013-10-03 MED ORDER — VERAPAMIL HCL 120 MG PO TABS: ORAL_TABLET | ORAL | Status: DC

## 2013-10-03 NOTE — Telephone Encounter (Signed)
Rx sent to the pharmacy by e-script.//AB/CMA 

## 2013-10-04 ENCOUNTER — Ambulatory Visit (INDEPENDENT_AMBULATORY_CARE_PROVIDER_SITE_OTHER): Payer: Medicare Other | Admitting: General Practice

## 2013-10-04 DIAGNOSIS — I4891 Unspecified atrial fibrillation: Secondary | ICD-10-CM | POA: Diagnosis not present

## 2013-10-04 DIAGNOSIS — Z5181 Encounter for therapeutic drug level monitoring: Secondary | ICD-10-CM

## 2013-10-04 LAB — POCT INR: INR: 2.6

## 2013-10-12 ENCOUNTER — Encounter: Payer: Medicare Other | Admitting: Internal Medicine

## 2013-10-17 ENCOUNTER — Encounter: Payer: Medicare Other | Admitting: Internal Medicine

## 2013-10-17 ENCOUNTER — Telehealth: Payer: Self-pay | Admitting: Internal Medicine

## 2013-10-17 MED ORDER — FLUTICASONE-SALMETEROL 100-50 MCG/DOSE IN AEPB: INHALATION_SPRAY | RESPIRATORY_TRACT | Status: DC

## 2013-10-17 NOTE — Telephone Encounter (Signed)
Pt is requesting a refill on Advair.  She thinks it requires a PA.

## 2013-10-17 NOTE — Telephone Encounter (Signed)
Advair has been refilled.  

## 2013-10-23 ENCOUNTER — Encounter: Payer: Medicare Other | Admitting: Internal Medicine

## 2013-11-01 ENCOUNTER — Encounter: Payer: Medicare Other | Admitting: Internal Medicine

## 2013-11-01 ENCOUNTER — Other Ambulatory Visit: Payer: Self-pay | Admitting: Nurse Practitioner

## 2013-11-01 ENCOUNTER — Ambulatory Visit: Payer: Medicare Other

## 2013-11-01 DIAGNOSIS — Z85038 Personal history of other malignant neoplasm of large intestine: Secondary | ICD-10-CM

## 2013-11-02 ENCOUNTER — Telehealth: Payer: Self-pay | Admitting: Oncology

## 2013-11-02 NOTE — Telephone Encounter (Signed)
kk out pt to see lt/bs 5/22. s/w dtr re appt and per dtr appt moved to pm as pt does not do well in the am.

## 2013-11-06 ENCOUNTER — Encounter: Payer: Medicare Other | Admitting: Internal Medicine

## 2013-11-08 ENCOUNTER — Ambulatory Visit (INDEPENDENT_AMBULATORY_CARE_PROVIDER_SITE_OTHER): Payer: Medicare Other | Admitting: General Practice

## 2013-11-08 ENCOUNTER — Ambulatory Visit (INDEPENDENT_AMBULATORY_CARE_PROVIDER_SITE_OTHER): Payer: Medicare Other | Admitting: Internal Medicine

## 2013-11-08 ENCOUNTER — Other Ambulatory Visit (INDEPENDENT_AMBULATORY_CARE_PROVIDER_SITE_OTHER): Payer: Medicare Other

## 2013-11-08 ENCOUNTER — Encounter: Payer: Self-pay | Admitting: Internal Medicine

## 2013-11-08 VITALS — BP 148/100 | HR 78 | Temp 97.1°F | Resp 15 | Ht 63.5 in | Wt 215.8 lb

## 2013-11-08 DIAGNOSIS — I10 Essential (primary) hypertension: Secondary | ICD-10-CM

## 2013-11-08 DIAGNOSIS — I4891 Unspecified atrial fibrillation: Secondary | ICD-10-CM

## 2013-11-08 DIAGNOSIS — M7580 Other shoulder lesions, unspecified shoulder: Secondary | ICD-10-CM

## 2013-11-08 DIAGNOSIS — Z86711 Personal history of pulmonary embolism: Secondary | ICD-10-CM

## 2013-11-08 DIAGNOSIS — Z5181 Encounter for therapeutic drug level monitoring: Secondary | ICD-10-CM | POA: Diagnosis not present

## 2013-11-08 DIAGNOSIS — Z85038 Personal history of other malignant neoplasm of large intestine: Secondary | ICD-10-CM | POA: Diagnosis not present

## 2013-11-08 DIAGNOSIS — M25819 Other specified joint disorders, unspecified shoulder: Secondary | ICD-10-CM

## 2013-11-08 DIAGNOSIS — M7542 Impingement syndrome of left shoulder: Secondary | ICD-10-CM

## 2013-11-08 DIAGNOSIS — K912 Postsurgical malabsorption, not elsewhere classified: Secondary | ICD-10-CM

## 2013-11-08 LAB — CBC WITH DIFFERENTIAL/PLATELET
Basophils Absolute: 0 10*3/uL (ref 0.0–0.1)
Basophils Relative: 0.3 % (ref 0.0–3.0)
EOS PCT: 0.9 % (ref 0.0–5.0)
Eosinophils Absolute: 0.1 10*3/uL (ref 0.0–0.7)
HEMATOCRIT: 39 % (ref 36.0–46.0)
Hemoglobin: 13 g/dL (ref 12.0–15.0)
LYMPHS ABS: 1 10*3/uL (ref 0.7–4.0)
Lymphocytes Relative: 14.9 % (ref 12.0–46.0)
MCHC: 33.3 g/dL (ref 30.0–36.0)
MCV: 89.5 fl (ref 78.0–100.0)
MONOS PCT: 6.9 % (ref 3.0–12.0)
Monocytes Absolute: 0.5 10*3/uL (ref 0.1–1.0)
NEUTROS ABS: 5.4 10*3/uL (ref 1.4–7.7)
Neutrophils Relative %: 77 % (ref 43.0–77.0)
Platelets: 133 10*3/uL — ABNORMAL LOW (ref 150.0–400.0)
RBC: 4.36 Mil/uL (ref 3.87–5.11)
RDW: 14.9 % (ref 11.5–15.5)
WBC: 7 10*3/uL (ref 4.0–10.5)

## 2013-11-08 LAB — BASIC METABOLIC PANEL
BUN: 18 mg/dL (ref 6–23)
CO2: 26 mEq/L (ref 19–32)
Calcium: 9.5 mg/dL (ref 8.4–10.5)
Chloride: 104 mEq/L (ref 96–112)
Creatinine, Ser: 1.4 mg/dL — ABNORMAL HIGH (ref 0.4–1.2)
GFR: 38.63 mL/min — ABNORMAL LOW (ref >60.00)
GLUCOSE: 105 mg/dL — AB (ref 70–99)
POTASSIUM: 3.8 meq/L (ref 3.5–5.1)
Sodium: 139 mEq/L (ref 135–145)

## 2013-11-08 LAB — HEPATIC FUNCTION PANEL
ALBUMIN: 4.2 g/dL (ref 3.5–5.2)
ALT: 8 U/L (ref 0–35)
AST: 18 U/L (ref 0–37)
Alkaline Phosphatase: 47 U/L (ref 39–117)
Bilirubin, Direct: 0.1 mg/dL (ref 0.0–0.3)
TOTAL PROTEIN: 7.4 g/dL (ref 6.0–8.3)
Total Bilirubin: 0.9 mg/dL (ref 0.2–1.2)

## 2013-11-08 LAB — TSH: TSH: 2.35 u[IU]/mL (ref 0.35–4.50)

## 2013-11-08 LAB — POCT INR: INR: 4.1

## 2013-11-08 NOTE — Progress Notes (Signed)
Pre visit review using our clinic review tool, if applicable. No additional management support is needed unless otherwise documented below in the visit note. 

## 2013-11-08 NOTE — Assessment & Plan Note (Signed)
Blood pressure goals reviewed. BMET 

## 2013-11-08 NOTE — Progress Notes (Signed)
   Subjective:    Patient ID: [name], female    DOB: 04/09/1927, 80 y.o.   MRN: [id number]  HPI She is here to assess active health issues & conditions. PMH, FH, & Social history verified & updated   Blood pressure range / average :130/80-150/<90 Compliant with anti hypertemsive medication. Lightheadedness with  Medications has improved.  A heart healthy /low salt diet is followed. No exercise program.   Review of Systems Intermittent headaches @ base of nose; Tylenol & Tramadol help.Occasional palpitations. DOE after 15 feet. Edema RLE only.  Intermittent diarrhea since colon surgery. Significant  epistaxis, chest pain,  claudication, or paroxysmal nocturnal dyspnea  absent.  She must push up from a chair or bed and is experiencing pain in her left shoulder with decreased range of motion .  She has had no oncologic followup in over 6 months.  She denies dysuria, pyuria, or hematuria.       Objective:   Physical Exam Gen.: Adequately -nourished in appearance. Weight excess.Alert, appropriate and cooperative throughout exam. Appears younger than stated age  Head: Normocephalic without obvious abnormalities Eyes: No corneal or conjunctival inflammation noted. Ptosis . No icterus. Ears: External  ear exam reveals no significant lesions or deformities.   Nose: External nasal exam reveals no deformity or inflammation. Nasal mucosa are pink and moist. No lesions or exudates noted.   Mouth: Oral mucosa and oropharynx reveal no lesions or exudates. Teeth in very poor repair with severe caries. Neck: No deformities, masses, or tenderness noted.  Thyroid small Lungs: Normal respiratory effort; chest expands symmetrically. Lungs are clear to auscultation without rales, wheezes, or increased work of breathing. Heart: Slow rate and irregular rhythm. Normal S1 and S2. No gallop, click, or rub.  Abdomen: Protuberant Bowel sounds normal; abdomen soft and nontender. No masses,  organomegaly or hernias noted. Genitalia:  as per Gyn                                  Musculoskeletal/extremities:Accentuated curvature of  thoracic spine. No clubbing or cyanosis. 1+ right lower extremity edema. Left lower extremity 1/2 + edema noted. Range of motion decreased L shoulder .Tone & strength decreased. Hand joints  reveal marked PIP/ DIP changes.  Fingernail  health good. Vascular: Carotid, radial artery, dorsalis pedis and  posterior tibial pulses are equal. Decreased pedal pulses. No bruits present. Neurologic: Alert and oriented x3. Deep tendon reflexes symmetrical but 0-1/2+.  Using walker Skin: Intact without suspicious lesions or rashes. Lymph: No cervical, axillary lymphadenopathy present. Psych: Mood and affect are normal. Normally interactive                                                                                        Assessment & Plan:  See Current Assessment & Plan in Problem List under specific DiagnosisThe labs will be reviewed and risks and options assessed. Written recommendations will be provided by mail or directly through My Chart.Further evaluation or change in medical therapy will be directed by those results.

## 2013-11-08 NOTE — Patient Instructions (Addendum)
Minimal Blood Pressure Goal= AVERAGE < 140/90;  Ideal is an AVERAGE < 135/85. This AVERAGE should be calculated from @ least 5-7 BP readings taken @ different times of day on different days of week. You should not respond to isolated BP readings , but rather the AVERAGE for that week .Please bring your  blood pressure cuff to office visits to verify that it is reliable.It  can also be checked against the blood pressure device at the pharmacy. Finger or wrist cuffs are not dependable; an arm cuff is. Please take a probiotic , Florastor OR Align, every day . This will replace the normal bacteria which  are necessary for formation of normal stool and processing of food. Use an anti-inflammatory cream such as Aspercreme or Zostrix cream twice a day to the affected area as needed. In lieu of this warm moist compresses or  hot water bottle can be used. Do not apply ice .  If cost is not excessive; Xarelto 20 mg daily with food or Eliquis 2.5 mg twice a day would be options  to consider in place of the warfarin to prevent recurrent deep venous thrombosis.Discuss this with the Coumadin Clinic & your Pharmacist.

## 2013-11-08 NOTE — Assessment & Plan Note (Addendum)
  If cost is not excessive; Xarelto 20 mg daily with food or Eliquis 2.5 mg twice a day would be options  to consider in place of the warfarin to prevent recurrent deep venous thrombosis.Discuss this with the Coumadin Clinic & your Pharmacist. 

## 2013-11-08 NOTE — Assessment & Plan Note (Signed)
CBC & dif LFT CEA

## 2013-11-08 NOTE — Assessment & Plan Note (Signed)
TSH 

## 2013-11-09 DIAGNOSIS — K912 Postsurgical malabsorption, not elsewhere classified: Secondary | ICD-10-CM | POA: Insufficient documentation

## 2013-11-09 DIAGNOSIS — M7542 Impingement syndrome of left shoulder: Secondary | ICD-10-CM | POA: Insufficient documentation

## 2013-11-09 LAB — CEA: CEA: 1.8 ng/mL (ref 0.0–5.0)

## 2013-11-09 NOTE — Assessment & Plan Note (Signed)
Use an anti-inflammatory cream such as Aspercreme or Zostrix cream twice a day to the affected area as needed. In lieu of this warm moist compresses or  hot water bottle can be used. Do not apply ice .  Sports medicine referral if no better

## 2013-11-09 NOTE — Assessment & Plan Note (Signed)
Trial of probiotics for loose-diarrheal stool

## 2013-11-10 ENCOUNTER — Other Ambulatory Visit: Payer: Self-pay | Admitting: Internal Medicine

## 2013-11-12 ENCOUNTER — Other Ambulatory Visit: Payer: Self-pay | Admitting: Internal Medicine

## 2013-11-12 DIAGNOSIS — N289 Disorder of kidney and ureter, unspecified: Secondary | ICD-10-CM

## 2013-11-12 DIAGNOSIS — N182 Chronic kidney disease, stage 2 (mild): Secondary | ICD-10-CM | POA: Insufficient documentation

## 2013-11-13 ENCOUNTER — Ambulatory Visit: Payer: Medicare Other

## 2013-11-13 ENCOUNTER — Other Ambulatory Visit: Payer: Self-pay

## 2013-11-13 ENCOUNTER — Other Ambulatory Visit: Payer: Medicare Other

## 2013-11-13 ENCOUNTER — Telehealth: Payer: Self-pay | Admitting: *Deleted

## 2013-11-13 ENCOUNTER — Telehealth: Payer: Self-pay

## 2013-11-13 DIAGNOSIS — R7309 Other abnormal glucose: Secondary | ICD-10-CM

## 2013-11-13 LAB — HEMOGLOBIN A1C: Hgb A1c MFr Bld: 5.6 % (ref 4.6–6.5)

## 2013-11-13 MED ORDER — ALPRAZOLAM 0.25 MG PO TABS: ORAL_TABLET | ORAL | Status: DC

## 2013-11-13 MED ORDER — WARFARIN SODIUM 2.5 MG PO TABS: ORAL_TABLET | ORAL | Status: DC

## 2013-11-13 MED ORDER — TRAMADOL HCL 50 MG PO TABS: ORAL_TABLET | ORAL | Status: DC

## 2013-11-13 NOTE — Telephone Encounter (Signed)
Request for add on lab has been faxed  

## 2013-11-13 NOTE — Telephone Encounter (Signed)
Message copied by Shelly Coss on Mon Nov 13, 2013 11:36 AM ------      Message from: Hendricks Limes      Created: Sun Nov 12, 2013  1:16 PM       Please add A1c (790.29)       ------

## 2013-11-13 NOTE — Addendum Note (Signed)
Addended by: Johnsie Cancel on: 11/13/2013 02:17 PM   Modules accepted: Orders

## 2013-11-13 NOTE — Telephone Encounter (Signed)
OK X 3 mos on all except Phenergan ; this is high risk med in elderly with risk of severe neurologic adverse effects

## 2013-11-13 NOTE — Telephone Encounter (Signed)
[name] called requesting refills on Tramadol, Alprazolam, Promethazine, and Warfarin please advise

## 2013-11-20 ENCOUNTER — Telehealth: Payer: Self-pay | Admitting: Oncology

## 2013-11-20 ENCOUNTER — Other Ambulatory Visit: Payer: Medicare Other

## 2013-11-20 ENCOUNTER — Ambulatory Visit: Payer: Medicare Other | Admitting: Oncology

## 2013-11-20 NOTE — Telephone Encounter (Signed)
pt dtr called to r/s 5/22 appt but also wants to be scheduled w/doctor as pt has been seeing PA's for a while. appt r/s from 5/22 to 6/2 per dtr and dtr made aware that BS will see pt w/LT

## 2013-11-24 ENCOUNTER — Other Ambulatory Visit: Payer: Medicare Other

## 2013-11-24 ENCOUNTER — Ambulatory Visit: Payer: Medicare Other | Admitting: Nurse Practitioner

## 2013-12-05 ENCOUNTER — Ambulatory Visit (INDEPENDENT_AMBULATORY_CARE_PROVIDER_SITE_OTHER): Payer: Medicare Other | Admitting: General Practice

## 2013-12-05 ENCOUNTER — Telehealth: Payer: Self-pay | Admitting: Oncology

## 2013-12-05 ENCOUNTER — Telehealth: Payer: Self-pay

## 2013-12-05 ENCOUNTER — Other Ambulatory Visit (HOSPITAL_BASED_OUTPATIENT_CLINIC_OR_DEPARTMENT_OTHER): Payer: Medicare Other

## 2013-12-05 ENCOUNTER — Ambulatory Visit (HOSPITAL_BASED_OUTPATIENT_CLINIC_OR_DEPARTMENT_OTHER): Payer: Medicare Other | Admitting: Nurse Practitioner

## 2013-12-05 VITALS — BP 117/64 | HR 64 | Temp 97.5°F | Resp 20 | Ht 63.5 in | Wt 213.8 lb

## 2013-12-05 DIAGNOSIS — Z5181 Encounter for therapeutic drug level monitoring: Secondary | ICD-10-CM

## 2013-12-05 DIAGNOSIS — R0989 Other specified symptoms and signs involving the circulatory and respiratory systems: Secondary | ICD-10-CM

## 2013-12-05 DIAGNOSIS — C180 Malignant neoplasm of cecum: Secondary | ICD-10-CM

## 2013-12-05 DIAGNOSIS — I4891 Unspecified atrial fibrillation: Secondary | ICD-10-CM

## 2013-12-05 DIAGNOSIS — Z85038 Personal history of other malignant neoplasm of large intestine: Secondary | ICD-10-CM

## 2013-12-05 DIAGNOSIS — R110 Nausea: Secondary | ICD-10-CM

## 2013-12-05 DIAGNOSIS — R0609 Other forms of dyspnea: Secondary | ICD-10-CM

## 2013-12-05 DIAGNOSIS — Z86718 Personal history of other venous thrombosis and embolism: Secondary | ICD-10-CM

## 2013-12-05 DIAGNOSIS — Z86711 Personal history of pulmonary embolism: Secondary | ICD-10-CM | POA: Diagnosis not present

## 2013-12-05 LAB — POCT INR: INR: 4

## 2013-12-05 NOTE — Progress Notes (Signed)
Pre visit review using our clinic review tool, if applicable. No additional management support is needed unless otherwise documented below in the visit note. 

## 2013-12-05 NOTE — Telephone Encounter (Signed)
Message copied by Barron Alvine on Tue Dec 05, 2013  4:27 PM ------      Message from: Milus Banister      Created: Tue Dec 05, 2013  3:53 PM       I tried 2-3 times in past 4 years to get her back to office to discuss repeating colonoscopy but she never responded.  I think at her age, would want to have office discussion first, see how frail she appears (if at all), before deciding.              I can have Myrl Lazarus get in touch with her to offer office appt again.                  ----- Message -----         From: [name], RN         Sent: 12/05/2013   3:46 PM           To: Milus Banister, MD, Owens Shark, NP, #            Hi Dr. Ardis Hughs,            The patient was seen in the office today by Dr. Benay Spice.              Question:      Based on findings of last colonoscopy and with her age of 35, would you recommend her having another screening colonoscopy?  If so, when?            Thank you,      Marcellus Scott       ------

## 2013-12-05 NOTE — Progress Notes (Addendum)
  [facility] OFFICE PROGRESS NOTE   Diagnosis:  Colon cancer.  INTERVAL HISTORY:   [name] is an 80 year old woman diagnosed with stage I (T2 N0) colon cancer in 2011. She was previously followed by Dr. Humphrey Rolls.  She is establishing care with Dr. Benay Spice.  She has mild intermittent nausea. No abdominal pain. Bowels moving regularly. No bloody or black stools. Appetite varies. She denies weight loss. She has mild dyspnea on exertion.  She denies any family history of cancer.  She reports being maintained on Coumadin for atrial fibrillation. She also reports history of DVT/PE in 2006, DVT in 2011 status post IVC filter placement.  Objective:  Vital signs in last 24 hours:  Blood pressure 117/64, pulse 64, temperature 97.5 F (36.4 C), temperature source Oral, resp. rate 20, height 5' 3.5" (1.613 m), weight 213 lb 12.8 oz (96.979 kg), SpO2 100.00%.    HEENT: No thrush or ulceration. Lymphatics: No palpable cervical, supraclavicular, axillary or inguinal lymph nodes. Resp: Lungs clear. Cardio: Irregular. GI: Soft and nontender. No hepatomegaly. Vascular: Pitting lower leg edema bilaterally right greater than left.    Lab Results:  Lab Results  Component Value Date   WBC 7.0 11/08/2013   HGB 13.0 11/08/2013   HCT 39.0 11/08/2013   MCV 89.5 11/08/2013   PLT 133.0* 11/08/2013   NEUTROABS 5.4 11/08/2013   11/08/2013 CEA 1.8.  Imaging:  No results found.  Medications: I have reviewed the patient's current medications.  Assessment/Plan: 1. Stage I (T2 N0) colon cancer status post right colectomy 06/17/2010.  Colonoscopy 06/16/2010 showed a 3 cm, nearly circumferential, ulcerated, friable mass in the cecum. The mass could not be biopsied. Otherwise normal examination of the colon. External hemorrhoids.  CEA 06/17/2010 0.8.  Status post right colectomy 06/17/2010. Final pathology showed invasive adenocarcinoma, moderately differentiated, spanning 5 cm extending into  but not through the muscularis propria; surgical resection margins negative; no evidence of cancer in 31 of 31 lymph nodes. No lymph-vascular invasion identified. No perineural invasion identified.  CEA 11/08/2013 1.8. 2. Atrial fibrillation. 3. History of DVT/PE 2006. 4. Bilateral lower extremity venous duplex 06/12/2010 with chronic deep vein thrombosis involving the right lower extremity and left lower extremity. 5. Status post IVC filter placement 06/28/2010.   Disposition: [name] remains in clinical remission from colon cancer. We will contact Dr. Ardis Hughs regarding the indication for a colonoscopy.  She will return for a followup visit and CEA in 6 months. She will contact the office in the interim with any problems.   Patient seen with Dr. Benay Spice. 25 minutes were spent face-to-face at today's visit with the majority that time involved in counseling/coordination of care.   Owens Shark ANP/GNP-BC   12/05/2013  5:02 PM  This was a shared visit with Ned Card. She remains in remission from colon cancer. She has a good prognosis for a long-term disease-free survival. I discussed recommended surveillance of colon cancer with [name] and her daughter.  Julieanne Manson, M.D.

## 2013-12-05 NOTE — Telephone Encounter (Signed)
GV AND PRINTED APPT SCHED AND AVS FOR PT FOR dec

## 2013-12-06 ENCOUNTER — Ambulatory Visit: Payer: Medicare Other

## 2013-12-06 NOTE — Telephone Encounter (Signed)
Appt made/ pt aware  

## 2013-12-22 ENCOUNTER — Ambulatory Visit: Payer: Medicare Other | Admitting: Cardiovascular Disease

## 2014-01-02 ENCOUNTER — Encounter: Payer: Medicare Other | Admitting: Cardiovascular Disease

## 2014-01-02 NOTE — Progress Notes (Signed)
No show

## 2014-01-09 ENCOUNTER — Ambulatory Visit (INDEPENDENT_AMBULATORY_CARE_PROVIDER_SITE_OTHER): Payer: Medicare Other | Admitting: General Practice

## 2014-01-09 DIAGNOSIS — I4891 Unspecified atrial fibrillation: Secondary | ICD-10-CM | POA: Diagnosis not present

## 2014-01-09 DIAGNOSIS — Z5181 Encounter for therapeutic drug level monitoring: Secondary | ICD-10-CM

## 2014-01-09 LAB — POCT INR: INR: 4.1

## 2014-01-09 NOTE — Progress Notes (Signed)
Pre visit review using our clinic review tool, if applicable. No additional management support is needed unless otherwise documented below in the visit note. 

## 2014-01-17 ENCOUNTER — Other Ambulatory Visit: Payer: Self-pay | Admitting: Internal Medicine

## 2014-01-19 ENCOUNTER — Other Ambulatory Visit: Payer: Self-pay | Admitting: Internal Medicine

## 2014-01-19 MED ORDER — PROMETHAZINE HCL 25 MG PO TABS: ORAL_TABLET | ORAL | Status: DC

## 2014-01-19 MED ORDER — FLUTICASONE-SALMETEROL 100-50 MCG/DOSE IN AEPB: INHALATION_SPRAY | RESPIRATORY_TRACT | Status: DC

## 2014-01-23 ENCOUNTER — Ambulatory Visit: Payer: Medicare Other

## 2014-01-26 ENCOUNTER — Ambulatory Visit (INDEPENDENT_AMBULATORY_CARE_PROVIDER_SITE_OTHER): Payer: Medicare Other | Admitting: General Practice

## 2014-01-26 DIAGNOSIS — I4891 Unspecified atrial fibrillation: Secondary | ICD-10-CM | POA: Diagnosis not present

## 2014-01-26 DIAGNOSIS — Z5181 Encounter for therapeutic drug level monitoring: Secondary | ICD-10-CM | POA: Diagnosis not present

## 2014-01-26 LAB — POCT INR: INR: 2

## 2014-01-26 NOTE — Progress Notes (Signed)
Pre visit review using our clinic review tool, if applicable. No additional management support is needed unless otherwise documented below in the visit note. 

## 2014-02-06 ENCOUNTER — Ambulatory Visit: Payer: Medicare Other | Admitting: Gastroenterology

## 2014-02-07 ENCOUNTER — Other Ambulatory Visit: Payer: Self-pay | Admitting: Internal Medicine

## 2014-02-08 ENCOUNTER — Other Ambulatory Visit: Payer: Self-pay

## 2014-02-08 MED ORDER — ALPRAZOLAM 0.25 MG PO TABS: ORAL_TABLET | ORAL | Status: DC

## 2014-02-19 ENCOUNTER — Other Ambulatory Visit: Payer: Self-pay | Admitting: Internal Medicine

## 2014-02-19 ENCOUNTER — Other Ambulatory Visit: Payer: Self-pay | Admitting: Family Medicine

## 2014-02-19 MED ORDER — FUROSEMIDE 40 MG PO TABS: ORAL_TABLET | ORAL | Status: DC

## 2014-02-19 MED ORDER — WARFARIN SODIUM 2.5 MG PO TABS: ORAL_TABLET | ORAL | Status: DC

## 2014-02-27 ENCOUNTER — Ambulatory Visit (INDEPENDENT_AMBULATORY_CARE_PROVIDER_SITE_OTHER): Payer: Medicare Other | Admitting: *Deleted

## 2014-02-27 ENCOUNTER — Encounter: Payer: Self-pay | Admitting: Cardiovascular Disease

## 2014-02-27 ENCOUNTER — Ambulatory Visit (INDEPENDENT_AMBULATORY_CARE_PROVIDER_SITE_OTHER): Payer: Medicare Other | Admitting: Cardiovascular Disease

## 2014-02-27 VITALS — BP 148/76 | HR 66 | Ht 63.5 in | Wt 205.8 lb

## 2014-02-27 DIAGNOSIS — I4891 Unspecified atrial fibrillation: Secondary | ICD-10-CM

## 2014-02-27 DIAGNOSIS — Z5181 Encounter for therapeutic drug level monitoring: Secondary | ICD-10-CM

## 2014-02-27 DIAGNOSIS — I482 Chronic atrial fibrillation, unspecified: Secondary | ICD-10-CM

## 2014-02-27 DIAGNOSIS — I340 Nonrheumatic mitral (valve) insufficiency: Secondary | ICD-10-CM

## 2014-02-27 DIAGNOSIS — I059 Rheumatic mitral valve disease, unspecified: Secondary | ICD-10-CM

## 2014-02-27 LAB — POCT INR: INR: 1.6

## 2014-02-27 NOTE — Progress Notes (Signed)
History of Present Illness: 80 yo female with history of paroxysmal atrial fibrillation, moderate LVH, mild MR, dilated left atrium and right atrium, anemia, DVT/PE s/p IVC filter, colon cancer s/p colectomy here today for follow up. I saw her as a new patient for evaluation of irregular heart rhythm in February 2013. She had palpitations starting in 2011 and was found to be anemic at that time and then her colon cancer was found. This was complicated by DVT and PE. She had an IVC filter placed in December 2011. She was started on coumadin back in 2006 after a traumatic hip injury and this was restarted in October of 2011. She has had no bleeding issues since then. Echo 10/07/11 with normal LV function, mild LVH, mild MR, severe biatrial enlargement, PA pressure 54-15mmHg.   She is here today for follow up and doing well. No chest pain, SOB, palpitations, near syncope or syncope. No bleeding issues.   Primary Care Physician: Linna Darner  Past Medical History  Diagnosis Date  . Anemia   . DVT (deep venous thrombosis) 11/06  . Pulmonary embolism 11/06  . Colon cancer 12/11  . Hypertension   . Atrial fibrillation     Past Surgical History  Procedure Laterality Date  . Appendectomy  1952  . Total hip arthroplasty  01/03/07    Left hip replacement.  . Colectomy  06/2010    partial, Dr. Donne Hazel complicated by LLE CVT; S/P IVC umbrella & anemia  . Total abdominal hysterectomy w/ bilateral salpingoophorectomy  1973    For Fibroids  . Vena cava filter placement  06/28/2010  . Hip fracture surgery  2006    Trauma    Current Outpatient Prescriptions  Medication Sig Dispense Refill  . acetaminophen (TYLENOL) 500 MG tablet Take 500 mg by mouth every 6 (six) hours as needed.      . ALPRAZolam (XANAX) 0.25 MG tablet TAKE ONE TABLET BY MOUTH TWICE DAILY AS NEEDED  30 tablet  0  . Calcium Carbonate-Vit D-Min (CALCIUM 1200 PO) Take by mouth daily.        . Fluticasone-Salmeterol (ADVAIR DISKUS)  100-50 MCG/DOSE AEPB INHALE ONE DOSE BY MOUTH TWICE DAILY  60 each  5  . furosemide (LASIX) 40 MG tablet TAKE ONE TABLET BY MOUTH ONCE DAILY  30 tablet  5  . metoprolol succinate (TOPROL-XL) 25 MG 24 hr tablet Take 25 mg by mouth daily.      . metoprolol tartrate (LOPRESSOR) 25 MG tablet TAKE ONE TABLET BY MOUTH TWICE DAILY  60 tablet  5  . promethazine (PHENERGAN) 25 MG tablet TAKE ONE TABLET BY MOUTH EVERY 6 TO 8 HOURS AS NEEDED  5 tablet  0  . traMADol (ULTRAM) 50 MG tablet TAKE ONE TABLET BY MOUTH EVERY 6 HOURS AS NEEDED FOR PAIN  30 tablet  3  . verapamil (CALAN) 120 MG tablet TAKE 1 TABLET BY MOUTH TWICE DAILY (IN PLACE OF DILTIAZEM 300)  180 tablet  1  . warfarin (COUMADIN) 2.5 MG tablet TAKE 1 TABLET BY MOUTH EVERY DAY EXCEPT FOR TUESDAY AND THURSDAY  90 tablet  0  . warfarin (COUMADIN) 5 MG tablet Take one pill by mouth on Tuesday and Thursday  90 tablet  0   No current facility-administered medications for this visit.    No Known Allergies  History   Social History  . Marital Status: Widowed    Spouse Name: N/A    Number of Children: 1  . Years of Education:  N/A   Occupational History  .     Social History Main Topics  . Smoking status: Never Smoker   . Smokeless tobacco: Not on file  . Alcohol Use: No  . Drug Use: No  . Sexual Activity: Not on file   Other Topics Concern  . Not on file   Social History Narrative  . No narrative on file    Family History  Problem Relation Age of Onset  . Peripheral vascular disease Father   . Heart attack Mother 34  . Heart disease Mother     MI  . Coronary artery disease Sister   . Diabetes Paternal Grandmother   . Coronary artery disease Maternal Grandfather     Review of Systems:  As stated in the HPI and otherwise negative.   BP 148/76  Pulse 66  Ht 5' 3.5" (1.613 m)  Wt 205 lb 12.8 oz (93.35 kg)  BMI 35.88 kg/m2  Physical Examination: General: Well developed, well nourished, NAD HEENT: OP clear, mucus  membranes moist SKIN: warm, dry. No rashes. Neuro: No focal deficits Musculoskeletal: Muscle strength 5/5 all ext Psychiatric: Mood and affect normal Neck: No JVD, no carotid bruits, no thyromegaly, no lymphadenopathy. Lungs:Clear bilaterally, no wheezes, rhonci, crackles Cardiovascular: Irregular irregular. No murmurs, gallops or rubs. Abdomen:Soft. Bowel sounds present. Non-tender.  Extremities: No lower extremity edema. Pulses are 2 + in the bilateral DP/PT.  Echo 10/07/11: Left ventricle: The cavity size was normal. Wall thickness was increased in a pattern of mild LVH. Systolic function was normal. The estimated ejection fraction was in the range of 55% to 60%. Wall motion was normal; there were no regional wall motion abnormalities. Indeterminant diastolic function. - Aortic valve: There was no stenosis. - Mitral valve: Mildly calcified annulus. Mild regurgitation. - Left atrium: The atrium was severely dilated. - Right ventricle: The cavity size was normal. Systolic function was normal. - Right atrium: The atrium was moderately to severely dilated. - Tricuspid valve: Peak RV-RA pressure 48 mmHg. - Pulmonary arteries: PA systolic pressure 69-62 mmHg. - Systemic veins: IVC measured 1.9 cm with normal respirophasic variation, suggesting RA pressure 6-10 mmHg. Impressions:  - The patient was in atrial fibrillation. Normal LV size and systolic function, EF 95-28%. Mild LV hypertrophy. The RV was normal in size and systolic function. Moderate to severe biatrial enlargement. Moderate pulmonary hypertension.  Assessment and Plan:   1. ATRIAL FIBRILLATION: Rate controlled. Cardizem CD stopped for insurance reasons and Verapamil/Lopressor now for rate control. Continue anti-coagulation with coumadin. No bleeding issues. We discussed the novel agents but she does not wish to consider.   2. Mitral regurgitation: Mild by echo 2013.

## 2014-02-27 NOTE — Patient Instructions (Signed)
Your physician wants you to follow-up in:  12 months.  You will receive a reminder letter in the mail two months in advance. If you don't receive a letter, please call our office to schedule the follow-up appointment.   

## 2014-03-19 ENCOUNTER — Ambulatory Visit (INDEPENDENT_AMBULATORY_CARE_PROVIDER_SITE_OTHER): Payer: Medicare Other

## 2014-03-19 DIAGNOSIS — I4891 Unspecified atrial fibrillation: Secondary | ICD-10-CM | POA: Diagnosis not present

## 2014-03-19 DIAGNOSIS — Z5181 Encounter for therapeutic drug level monitoring: Secondary | ICD-10-CM

## 2014-03-19 LAB — POCT INR: INR: 1.9

## 2014-03-22 ENCOUNTER — Other Ambulatory Visit: Payer: Self-pay | Admitting: Internal Medicine

## 2014-03-23 ENCOUNTER — Other Ambulatory Visit: Payer: Self-pay

## 2014-03-23 MED ORDER — VERAPAMIL HCL 120 MG PO TABS: ORAL_TABLET | ORAL | Status: DC

## 2014-03-23 MED ORDER — METOPROLOL TARTRATE 25 MG PO TABS: ORAL_TABLET | ORAL | Status: DC

## 2014-04-09 ENCOUNTER — Ambulatory Visit: Payer: Medicare Other | Admitting: Gastroenterology

## 2014-04-24 ENCOUNTER — Ambulatory Visit (INDEPENDENT_AMBULATORY_CARE_PROVIDER_SITE_OTHER): Payer: Medicare Other | Admitting: Pharmacist

## 2014-04-24 DIAGNOSIS — I4891 Unspecified atrial fibrillation: Secondary | ICD-10-CM

## 2014-04-24 DIAGNOSIS — Z5181 Encounter for therapeutic drug level monitoring: Secondary | ICD-10-CM | POA: Diagnosis not present

## 2014-04-24 LAB — POCT INR: INR: 2.9

## 2014-05-11 ENCOUNTER — Other Ambulatory Visit: Payer: Self-pay | Admitting: Internal Medicine

## 2014-05-11 MED ORDER — PROMETHAZINE HCL 25 MG PO TABS: ORAL_TABLET | ORAL | Status: DC

## 2014-05-11 MED ORDER — WARFARIN SODIUM 2.5 MG PO TABS: ORAL_TABLET | ORAL | Status: DC

## 2014-05-23 ENCOUNTER — Ambulatory Visit (INDEPENDENT_AMBULATORY_CARE_PROVIDER_SITE_OTHER): Payer: Medicare Other | Admitting: Pharmacist

## 2014-05-23 DIAGNOSIS — Z5181 Encounter for therapeutic drug level monitoring: Secondary | ICD-10-CM | POA: Diagnosis not present

## 2014-05-23 DIAGNOSIS — Z23 Encounter for immunization: Secondary | ICD-10-CM

## 2014-05-23 DIAGNOSIS — I4891 Unspecified atrial fibrillation: Secondary | ICD-10-CM | POA: Diagnosis not present

## 2014-05-23 LAB — POCT INR: INR: 2.6

## 2014-05-24 ENCOUNTER — Other Ambulatory Visit: Payer: Self-pay | Admitting: Internal Medicine

## 2014-05-25 ENCOUNTER — Other Ambulatory Visit: Payer: Self-pay

## 2014-05-25 MED ORDER — ALPRAZOLAM 0.25 MG PO TABS: ORAL_TABLET | ORAL | Status: DC

## 2014-05-25 NOTE — Telephone Encounter (Signed)
Alprazolam has been phoned to [facility] in [place]

## 2014-06-10 ENCOUNTER — Other Ambulatory Visit: Payer: Self-pay | Admitting: Internal Medicine

## 2014-06-11 NOTE — Telephone Encounter (Signed)
TRAMADOL HAS BEEN CALLED TO [facility]

## 2014-06-11 NOTE — Telephone Encounter (Signed)
OK X1 

## 2014-06-12 ENCOUNTER — Ambulatory Visit (HOSPITAL_BASED_OUTPATIENT_CLINIC_OR_DEPARTMENT_OTHER): Payer: Medicare Other | Admitting: Oncology

## 2014-06-12 ENCOUNTER — Telehealth: Payer: Self-pay | Admitting: Oncology

## 2014-06-12 ENCOUNTER — Other Ambulatory Visit: Payer: Medicare Other

## 2014-06-12 VITALS — BP 168/74 | HR 50 | Temp 98.1°F | Resp 18 | Ht 63.5 in | Wt 202.7 lb

## 2014-06-12 DIAGNOSIS — Z85038 Personal history of other malignant neoplasm of large intestine: Secondary | ICD-10-CM

## 2014-06-12 DIAGNOSIS — C189 Malignant neoplasm of colon, unspecified: Secondary | ICD-10-CM

## 2014-06-12 DIAGNOSIS — Z86718 Personal history of other venous thrombosis and embolism: Secondary | ICD-10-CM

## 2014-06-12 DIAGNOSIS — Z86711 Personal history of pulmonary embolism: Secondary | ICD-10-CM

## 2014-06-12 NOTE — Telephone Encounter (Signed)
Lft msg for pt confirming MD visit per 12/08 POF, mailed sch to pt.... KJ

## 2014-06-12 NOTE — Progress Notes (Signed)
  [facility] OFFICE PROGRESS NOTE   Diagnosis: Colon cancer  INTERVAL HISTORY:   She returns as scheduled. She has intermittent "dizzy "spells. These occur after taking her medications. She did not see Dr. Ardis Hughs.  Objective:  Vital signs in last 24 hours:  Blood pressure 149/56, pulse 50, temperature 98.1 F (36.7 C), temperature source Oral, resp. rate 18, height 5' 3.5" (1.613 m), weight 202 lb 11.2 oz (91.944 kg). repeat manual blood pressure 168/74    HEENT: Neck without mass Lymphatics: No cervical, supraclavicular, axillary, or inguinal nodes. Prominent bilateral axillary fat pads. Resp: Lungs clear bilaterally Cardio: Irregular GI: No hepatosplenomegaly, nontender, no mass Vascular: Trace pitting edema at the right greater than left lower leg   Lab Results:  Lab Results  Component Value Date   WBC 7.0 11/08/2013   HGB 13.0 11/08/2013   HCT 39.0 11/08/2013   MCV 89.5 11/08/2013   PLT 133.0* 11/08/2013   NEUTROABS 5.4 11/08/2013     Lab Results  Component Value Date   CEA 1.8 11/08/2013   Medications: I have reviewed the patient's current medications.  Assessment/Plan: 1. Stage I (T2 N0) colon cancer status post right colectomy 06/17/2010.  Colonoscopy 06/16/2010 showed a 3 cm, nearly circumferential, ulcerated, friable mass in the cecum. The mass could not be biopsied. Otherwise normal examination of the colon. External hemorrhoids.  CEA 06/17/2010 0.8.  Status post right colectomy 06/17/2010. Final pathology showed invasive adenocarcinoma, moderately differentiated, spanning 5 cm extending into but not through the muscularis propria; surgical resection margins negative; no evidence of cancer in 31 of 31 lymph nodes. No lymph-vascular invasion identified. No perineural invasion identified. 2. Atrial fibrillation. 3. History of DVT/PE 2006. 4. Bilateral lower extremity venous duplex 06/12/2010 with chronic deep vein thrombosis involving the  right lower extremity and left lower extremity. 5. Status post IVC filter placement 06/28/2010.   Disposition:  Ms. Minnis remains in clinical remission from colon cancer. She would like to continue follow-up at the [facility]. She will return for an office visit in one year.  We discussed the indication for a surveillance colonoscopy. She does not wish to undergo a colonoscopy. I think this is reasonable at her age and with comorbid conditions.  Betsy Coder, MD  06/12/2014  4:03 PM

## 2014-06-13 ENCOUNTER — Telehealth: Payer: Self-pay | Admitting: *Deleted

## 2014-06-13 LAB — CEA: CEA: 1.5 ng/mL (ref 0.0–5.0)

## 2014-06-13 NOTE — Telephone Encounter (Signed)
Per Dr. Benay Spice; notified pt's daughter that cea is normal.  Pt's daughter verbalized understanding and expressed appreciation for call.

## 2014-06-13 NOTE — Telephone Encounter (Signed)
-----   Message from Ladell Pier, MD sent at 06/13/2014  2:04 PM EST ----- Please call patient, cea is normal

## 2014-06-26 ENCOUNTER — Ambulatory Visit (INDEPENDENT_AMBULATORY_CARE_PROVIDER_SITE_OTHER): Payer: Medicare Other | Admitting: Pharmacist

## 2014-06-26 DIAGNOSIS — I4891 Unspecified atrial fibrillation: Secondary | ICD-10-CM | POA: Diagnosis not present

## 2014-06-26 DIAGNOSIS — Z5181 Encounter for therapeutic drug level monitoring: Secondary | ICD-10-CM | POA: Diagnosis not present

## 2014-06-26 LAB — POCT INR: INR: 2.7

## 2014-08-01 ENCOUNTER — Other Ambulatory Visit (INDEPENDENT_AMBULATORY_CARE_PROVIDER_SITE_OTHER): Payer: Medicare Other

## 2014-08-01 ENCOUNTER — Other Ambulatory Visit: Payer: Medicare Other

## 2014-08-01 ENCOUNTER — Encounter: Payer: Self-pay | Admitting: Internal Medicine

## 2014-08-01 ENCOUNTER — Ambulatory Visit (INDEPENDENT_AMBULATORY_CARE_PROVIDER_SITE_OTHER): Payer: Medicare Other | Admitting: Internal Medicine

## 2014-08-01 ENCOUNTER — Ambulatory Visit (INDEPENDENT_AMBULATORY_CARE_PROVIDER_SITE_OTHER): Payer: Medicare Other

## 2014-08-01 VITALS — BP 124/78 | HR 57 | Temp 97.7°F

## 2014-08-01 DIAGNOSIS — R10A1 Flank pain, right side: Secondary | ICD-10-CM

## 2014-08-01 DIAGNOSIS — R109 Unspecified abdominal pain: Secondary | ICD-10-CM

## 2014-08-01 DIAGNOSIS — R1011 Right upper quadrant pain: Secondary | ICD-10-CM

## 2014-08-01 DIAGNOSIS — Z5181 Encounter for therapeutic drug level monitoring: Secondary | ICD-10-CM | POA: Diagnosis not present

## 2014-08-01 LAB — CBC WITH DIFFERENTIAL/PLATELET
BASOS ABS: 0 10*3/uL (ref 0.0–0.1)
BASOS PCT: 0.3 % (ref 0.0–3.0)
EOS ABS: 0 10*3/uL (ref 0.0–0.7)
EOS PCT: 0.4 % (ref 0.0–5.0)
HEMATOCRIT: 41.2 % (ref 36.0–46.0)
Hemoglobin: 13.6 g/dL (ref 12.0–15.0)
LYMPHS ABS: 0.8 10*3/uL (ref 0.7–4.0)
LYMPHS PCT: 10 % — AB (ref 12.0–46.0)
MCHC: 33.1 g/dL (ref 30.0–36.0)
MCV: 86.3 fl (ref 78.0–100.0)
MONOS PCT: 5.7 % (ref 3.0–12.0)
Monocytes Absolute: 0.5 10*3/uL (ref 0.1–1.0)
NEUTROS PCT: 83.6 % — AB (ref 43.0–77.0)
Neutro Abs: 6.7 10*3/uL (ref 1.4–7.7)
PLATELETS: 126 10*3/uL — AB (ref 150.0–400.0)
RBC: 4.77 Mil/uL (ref 3.87–5.11)
RDW: 15.7 % — ABNORMAL HIGH (ref 11.5–15.5)
WBC: 8.1 10*3/uL (ref 4.0–10.5)

## 2014-08-01 LAB — URINALYSIS, ROUTINE W REFLEX MICROSCOPIC
Leukocytes, UA: NEGATIVE
Nitrite: NEGATIVE
Specific Gravity, Urine: >=1.03 — AB (ref 1.000–1.030)
TOTAL PROTEIN, URINE-UPE24: 30 — AB
URINE GLUCOSE: NEGATIVE
Urobilinogen, UA: 1 (ref 0.0–1.0)
pH: 5.5 (ref 5.0–8.0)

## 2014-08-01 LAB — BASIC METABOLIC PANEL
BUN: 20 mg/dL (ref 6–23)
CALCIUM: 9.9 mg/dL (ref 8.4–10.5)
CO2: 28 meq/L (ref 19–32)
Chloride: 104 mEq/L (ref 96–112)
Creatinine, Ser: 1.34 mg/dL — ABNORMAL HIGH (ref 0.40–1.20)
GFR: 39.89 mL/min — ABNORMAL LOW (ref >60.00)
GLUCOSE: 115 mg/dL — AB (ref 70–99)
Potassium: 4.3 mEq/L (ref 3.5–5.1)
SODIUM: 141 meq/L (ref 135–145)

## 2014-08-01 LAB — HEPATIC FUNCTION PANEL
ALT: 4 U/L (ref 0–35)
AST: 11 U/L (ref 0–37)
Albumin: 4.2 g/dL (ref 3.5–5.2)
Alkaline Phosphatase: 51 U/L (ref 39–117)
BILIRUBIN DIRECT: 0.2 mg/dL (ref 0.0–0.3)
Total Bilirubin: 0.8 mg/dL (ref 0.2–1.2)
Total Protein: 7.3 g/dL (ref 6.0–8.3)

## 2014-08-01 LAB — POCT INR: INR: 3.6

## 2014-08-01 MED ORDER — FENTANYL 25 MCG/HR TD PT72: 25.0000 ug | MEDICATED_PATCH | TRANSDERMAL | Status: DC

## 2014-08-01 NOTE — Progress Notes (Signed)
Pre visit review using our clinic review tool, if applicable. No additional management support is needed unless otherwise documented below in the visit note. 

## 2014-08-01 NOTE — Patient Instructions (Signed)
Your next office appointment will be determined based upon review of your pending labs. Those instructions will be transmitted to you through My Chart  Critical values will be called    Followup as needed for your acute issue. Please report any significant change in your symptoms.

## 2014-08-01 NOTE — Progress Notes (Signed)
   Subjective:    Patient ID: [name], female    DOB: [birth year], 81 y.o.   MRN: [id number]  HPI Symptoms began 07/21/14 as right flank and right abdominal pain; this has progressed. It's intermittent up to level X. She describes it as  stabbing in Scientist, research (physical sciences). It has been almost constant since onset.  It had been worse with elevating the right lower extremity but not today. She also noted pain with lateral flexion of the thorax especially to the right.  She has taken tramadol, alprazoloam, and topical OTC anti-inflammatory patch. The tramadol did help her sleep but it did not relieve the pain significantly.   She also describes some fatigue.  She denies constitutional, genitourinary, or GI symptoms otherwise.  Dr Dorothy Puffer  seen 06/12/14; clinically she was felt to be in remission. [facility] colonoscopy declined.  Review of Systems Unexplained weight loss, abdominal pain, significant dyspepsia, dysphagia, melena, rectal bleeding, or persistently small caliber stools are denied.  Dysuria, pyuria, hematuria, frequency, nocturia or polyuria are denied.    Objective:   Physical Exam  Pertinent or positive findings include She has severe caries of the maxillary teeth  There is a slightly irregular cardiac rhythm Accentuated curvature of the upper thoracic spine is present She has severe mixed PIP/DIP changes She has bilateral edema, greater on the right than the left. There is trace pitting at the sock line. Pedal pulses are decreased but intact Tenting is present.  General appearance :adequately nourished; in no distress. Eyes: No conjunctival inflammation or scleral icterus is present. Oral exam: Lips and gums are healthy appearing.There is no oropharyngeal erythema or exudate noted.  Heart:  Slow rate . S1 and S2 normal without gallop, murmur, click, rub or other extra sounds   Lungs:Chest clear to auscultation; no wheezes, rhonchi,rales ,or rubs present.No increased  work of breathing.  Abdomen: bowel sounds normal, soft and non-tender without masses, organomegaly or hernias noted.  No guarding or rebound. No flank tenderness to percussion. Vascular : all pulses equal ; no bruits present. Skin:Warm & dry.  Intact without suspicious lesions or rashes ; no jaundice  Lymphatic: No lymphadenopathy is noted about the head, neck, axilla Neuro: Strength, tone normal. No pain with SLR while sitting. Using rolling walker        Assessment & Plan:  #1 abdominal pain versus thoracic radicular pain.Unable to stand for thoracic spine films. CT may be necessary if labs non diagnostic &  if no better with meds  See orders

## 2014-08-02 ENCOUNTER — Encounter: Payer: Self-pay | Admitting: Internal Medicine

## 2014-08-02 ENCOUNTER — Other Ambulatory Visit: Payer: Medicare Other

## 2014-08-02 ENCOUNTER — Telehealth: Payer: Self-pay

## 2014-08-02 DIAGNOSIS — R8290 Unspecified abnormal findings in urine: Secondary | ICD-10-CM | POA: Diagnosis not present

## 2014-08-02 NOTE — Telephone Encounter (Signed)
-----   Message from Hendricks Limes, MD sent at 08/02/2014  1:23 PM EST ----- Please obtain  urine culture

## 2014-08-02 NOTE — Telephone Encounter (Signed)
Request for add on has been faxed to lab and order placed

## 2014-08-03 ENCOUNTER — Other Ambulatory Visit: Payer: Self-pay | Admitting: Internal Medicine

## 2014-08-03 ENCOUNTER — Encounter: Payer: Self-pay | Admitting: Internal Medicine

## 2014-08-03 DIAGNOSIS — R1010 Upper abdominal pain, unspecified: Secondary | ICD-10-CM

## 2014-08-03 LAB — URINE CULTURE
COLONY COUNT: NO GROWTH
ORGANISM ID, BACTERIA: NO GROWTH

## 2014-08-06 ENCOUNTER — Other Ambulatory Visit: Payer: Self-pay | Admitting: Internal Medicine

## 2014-08-06 ENCOUNTER — Ambulatory Visit (HOSPITAL_BASED_OUTPATIENT_CLINIC_OR_DEPARTMENT_OTHER)
Admission: RE | Admit: 2014-08-06 | Discharge: 2014-08-06 | Disposition: A | Discharge status: Home / Self Care | Payer: Medicare Other | Source: Ambulatory Visit | Attending: Internal Medicine | Admitting: Internal Medicine

## 2014-08-06 ENCOUNTER — Encounter: Payer: Self-pay | Admitting: Internal Medicine

## 2014-08-06 DIAGNOSIS — M5134 Other intervertebral disc degeneration, thoracic region: Secondary | ICD-10-CM | POA: Insufficient documentation

## 2014-08-06 DIAGNOSIS — I10 Essential (primary) hypertension: Secondary | ICD-10-CM | POA: Insufficient documentation

## 2014-08-06 DIAGNOSIS — Z85038 Personal history of other malignant neoplasm of large intestine: Secondary | ICD-10-CM | POA: Insufficient documentation

## 2014-08-06 DIAGNOSIS — I517 Cardiomegaly: Secondary | ICD-10-CM | POA: Diagnosis not present

## 2014-08-06 DIAGNOSIS — N281 Cyst of kidney, acquired: Secondary | ICD-10-CM

## 2014-08-06 DIAGNOSIS — K449 Diaphragmatic hernia without obstruction or gangrene: Secondary | ICD-10-CM | POA: Insufficient documentation

## 2014-08-06 DIAGNOSIS — I708 Atherosclerosis of other arteries: Secondary | ICD-10-CM | POA: Insufficient documentation

## 2014-08-06 DIAGNOSIS — N289 Disorder of kidney and ureter, unspecified: Secondary | ICD-10-CM | POA: Insufficient documentation

## 2014-08-06 DIAGNOSIS — I700 Atherosclerosis of aorta: Secondary | ICD-10-CM | POA: Diagnosis not present

## 2014-08-06 DIAGNOSIS — R1010 Upper abdominal pain, unspecified: Secondary | ICD-10-CM

## 2014-08-06 DIAGNOSIS — M47896 Other spondylosis, lumbar region: Secondary | ICD-10-CM | POA: Diagnosis not present

## 2014-08-06 DIAGNOSIS — M5415 Radiculopathy, thoracolumbar region: Secondary | ICD-10-CM

## 2014-08-06 DIAGNOSIS — M5135 Other intervertebral disc degeneration, thoracolumbar region: Secondary | ICD-10-CM | POA: Diagnosis not present

## 2014-08-06 MED ORDER — ALPRAZOLAM 0.25 MG PO TABS: ORAL_TABLET | ORAL | Status: DC

## 2014-08-07 ENCOUNTER — Encounter: Payer: Self-pay | Admitting: Internal Medicine

## 2014-08-07 ENCOUNTER — Other Ambulatory Visit: Payer: Self-pay | Admitting: Internal Medicine

## 2014-08-07 DIAGNOSIS — R10A1 Flank pain, right side: Secondary | ICD-10-CM

## 2014-08-07 DIAGNOSIS — R109 Unspecified abdominal pain: Secondary | ICD-10-CM

## 2014-08-07 DIAGNOSIS — R1011 Right upper quadrant pain: Secondary | ICD-10-CM

## 2014-08-07 MED ORDER — FENTANYL 50 MCG/HR TD PT72: 50.0000 ug | MEDICATED_PATCH | TRANSDERMAL | Status: DC

## 2014-08-08 ENCOUNTER — Ambulatory Visit (HOSPITAL_BASED_OUTPATIENT_CLINIC_OR_DEPARTMENT_OTHER)
Admission: RE | Admit: 2014-08-08 | Discharge: 2014-08-08 | Disposition: A | Discharge status: Home / Self Care | Payer: Medicare Other | Source: Ambulatory Visit | Attending: Internal Medicine | Admitting: Internal Medicine

## 2014-08-08 DIAGNOSIS — R934 Abnormal findings on diagnostic imaging of urinary organs: Secondary | ICD-10-CM | POA: Insufficient documentation

## 2014-08-08 DIAGNOSIS — R109 Unspecified abdominal pain: Secondary | ICD-10-CM | POA: Insufficient documentation

## 2014-08-08 DIAGNOSIS — K8020 Calculus of gallbladder without cholecystitis without obstruction: Secondary | ICD-10-CM | POA: Insufficient documentation

## 2014-08-08 DIAGNOSIS — Z85038 Personal history of other malignant neoplasm of large intestine: Secondary | ICD-10-CM | POA: Insufficient documentation

## 2014-08-08 DIAGNOSIS — N289 Disorder of kidney and ureter, unspecified: Secondary | ICD-10-CM | POA: Insufficient documentation

## 2014-08-08 DIAGNOSIS — N281 Cyst of kidney, acquired: Secondary | ICD-10-CM

## 2014-08-09 ENCOUNTER — Encounter: Payer: Self-pay | Admitting: Internal Medicine

## 2014-08-17 ENCOUNTER — Other Ambulatory Visit: Payer: Self-pay | Admitting: Internal Medicine

## 2014-08-17 MED ORDER — FENTANYL 50 MCG/HR TD PT72: 50.0000 ug | MEDICATED_PATCH | TRANSDERMAL | Status: DC

## 2014-08-28 ENCOUNTER — Ambulatory Visit (INDEPENDENT_AMBULATORY_CARE_PROVIDER_SITE_OTHER): Payer: Medicare Other | Admitting: *Deleted

## 2014-08-28 DIAGNOSIS — M545 Low back pain: Secondary | ICD-10-CM | POA: Diagnosis not present

## 2014-08-28 DIAGNOSIS — I4891 Unspecified atrial fibrillation: Secondary | ICD-10-CM

## 2014-08-28 DIAGNOSIS — Z5181 Encounter for therapeutic drug level monitoring: Secondary | ICD-10-CM | POA: Diagnosis not present

## 2014-08-28 LAB — POCT INR: INR: 2.5

## 2014-09-04 ENCOUNTER — Other Ambulatory Visit: Payer: Self-pay | Admitting: Internal Medicine

## 2014-09-04 MED ORDER — WARFARIN SODIUM 2.5 MG PO TABS: ORAL_TABLET | ORAL | Status: DC

## 2014-09-06 DIAGNOSIS — M5125 Other intervertebral disc displacement, thoracolumbar region: Secondary | ICD-10-CM | POA: Diagnosis not present

## 2014-09-06 DIAGNOSIS — M5440 Lumbago with sciatica, unspecified side: Secondary | ICD-10-CM | POA: Diagnosis not present

## 2014-09-06 DIAGNOSIS — M5126 Other intervertebral disc displacement, lumbar region: Secondary | ICD-10-CM | POA: Diagnosis not present

## 2014-09-10 ENCOUNTER — Other Ambulatory Visit: Payer: Self-pay | Admitting: General Practice

## 2014-09-11 ENCOUNTER — Other Ambulatory Visit: Payer: Self-pay | Admitting: Internal Medicine

## 2014-09-11 MED ORDER — TRAMADOL HCL 50 MG PO TABS: 50.0000 mg | ORAL_TABLET | Freq: Four times a day (QID) | ORAL | Status: DC | PRN

## 2014-09-11 MED ORDER — FUROSEMIDE 40 MG PO TABS: ORAL_TABLET | ORAL | Status: DC

## 2014-09-11 MED ORDER — ALPRAZOLAM 0.25 MG PO TABS: ORAL_TABLET | ORAL | Status: DC

## 2014-09-11 MED ORDER — METOPROLOL TARTRATE 25 MG PO TABS: ORAL_TABLET | ORAL | Status: DC

## 2014-09-11 NOTE — Telephone Encounter (Signed)
Tramadol and Alprazolam have been called to [facility] in [place]. Furosemide and Metoprolol have been routed electronically.

## 2014-09-25 ENCOUNTER — Ambulatory Visit (INDEPENDENT_AMBULATORY_CARE_PROVIDER_SITE_OTHER): Payer: Medicare Other | Admitting: *Deleted

## 2014-09-25 DIAGNOSIS — I4891 Unspecified atrial fibrillation: Secondary | ICD-10-CM

## 2014-09-25 DIAGNOSIS — M4316 Spondylolisthesis, lumbar region: Secondary | ICD-10-CM | POA: Diagnosis not present

## 2014-09-25 DIAGNOSIS — Z5181 Encounter for therapeutic drug level monitoring: Secondary | ICD-10-CM | POA: Diagnosis not present

## 2014-09-25 DIAGNOSIS — M4806 Spinal stenosis, lumbar region: Secondary | ICD-10-CM | POA: Diagnosis not present

## 2014-09-25 LAB — POCT INR: INR: 2.2

## 2014-09-26 ENCOUNTER — Other Ambulatory Visit: Payer: Self-pay | Admitting: Internal Medicine

## 2014-10-02 ENCOUNTER — Other Ambulatory Visit: Payer: Self-pay | Admitting: Internal Medicine

## 2014-11-02 ENCOUNTER — Ambulatory Visit (INDEPENDENT_AMBULATORY_CARE_PROVIDER_SITE_OTHER): Payer: Medicare Other

## 2014-11-02 DIAGNOSIS — M4806 Spinal stenosis, lumbar region: Secondary | ICD-10-CM | POA: Diagnosis not present

## 2014-11-02 DIAGNOSIS — Z5181 Encounter for therapeutic drug level monitoring: Secondary | ICD-10-CM | POA: Diagnosis not present

## 2014-11-02 DIAGNOSIS — I4891 Unspecified atrial fibrillation: Secondary | ICD-10-CM

## 2014-11-02 DIAGNOSIS — M4316 Spondylolisthesis, lumbar region: Secondary | ICD-10-CM | POA: Diagnosis not present

## 2014-11-02 LAB — POCT INR: INR: 2.2

## 2014-11-30 DIAGNOSIS — G459 Transient cerebral ischemic attack, unspecified: Secondary | ICD-10-CM | POA: Diagnosis not present

## 2014-11-30 DIAGNOSIS — I482 Chronic atrial fibrillation: Secondary | ICD-10-CM | POA: Diagnosis not present

## 2014-11-30 DIAGNOSIS — I6602 Occlusion and stenosis of left middle cerebral artery: Secondary | ICD-10-CM | POA: Diagnosis not present

## 2014-11-30 DIAGNOSIS — N390 Urinary tract infection, site not specified: Secondary | ICD-10-CM | POA: Diagnosis not present

## 2014-11-30 DIAGNOSIS — I10 Essential (primary) hypertension: Secondary | ICD-10-CM | POA: Diagnosis not present

## 2014-11-30 DIAGNOSIS — I639 Cerebral infarction, unspecified: Secondary | ICD-10-CM

## 2014-11-30 DIAGNOSIS — R4182 Altered mental status, unspecified: Secondary | ICD-10-CM | POA: Diagnosis not present

## 2014-11-30 DIAGNOSIS — I6789 Other cerebrovascular disease: Secondary | ICD-10-CM | POA: Diagnosis not present

## 2014-11-30 DIAGNOSIS — R29810 Facial weakness: Secondary | ICD-10-CM | POA: Diagnosis not present

## 2014-11-30 DIAGNOSIS — I63511 Cerebral infarction due to unspecified occlusion or stenosis of right middle cerebral artery: Secondary | ICD-10-CM | POA: Diagnosis not present

## 2014-11-30 DIAGNOSIS — D696 Thrombocytopenia, unspecified: Secondary | ICD-10-CM | POA: Diagnosis not present

## 2014-11-30 DIAGNOSIS — R4781 Slurred speech: Secondary | ICD-10-CM | POA: Diagnosis not present

## 2014-11-30 DIAGNOSIS — R918 Other nonspecific abnormal finding of lung field: Secondary | ICD-10-CM | POA: Diagnosis not present

## 2014-11-30 HISTORY — DX: Cerebral infarction, unspecified: I63.9

## 2014-12-01 DIAGNOSIS — G459 Transient cerebral ischemic attack, unspecified: Secondary | ICD-10-CM | POA: Diagnosis not present

## 2014-12-01 DIAGNOSIS — Z79899 Other long term (current) drug therapy: Secondary | ICD-10-CM | POA: Diagnosis not present

## 2014-12-01 DIAGNOSIS — I672 Cerebral atherosclerosis: Secondary | ICD-10-CM | POA: Diagnosis not present

## 2014-12-01 DIAGNOSIS — Z96642 Presence of left artificial hip joint: Secondary | ICD-10-CM | POA: Diagnosis present

## 2014-12-01 DIAGNOSIS — Z86711 Personal history of pulmonary embolism: Secondary | ICD-10-CM | POA: Diagnosis not present

## 2014-12-01 DIAGNOSIS — I6602 Occlusion and stenosis of left middle cerebral artery: Secondary | ICD-10-CM | POA: Diagnosis present

## 2014-12-01 DIAGNOSIS — N189 Chronic kidney disease, unspecified: Secondary | ICD-10-CM | POA: Diagnosis present

## 2014-12-01 DIAGNOSIS — I482 Chronic atrial fibrillation: Secondary | ICD-10-CM | POA: Diagnosis not present

## 2014-12-01 DIAGNOSIS — I129 Hypertensive chronic kidney disease with stage 1 through stage 4 chronic kidney disease, or unspecified chronic kidney disease: Secondary | ICD-10-CM | POA: Diagnosis present

## 2014-12-01 DIAGNOSIS — R930 Abnormal findings on diagnostic imaging of skull and head, not elsewhere classified: Secondary | ICD-10-CM | POA: Diagnosis not present

## 2014-12-01 DIAGNOSIS — I071 Rheumatic tricuspid insufficiency: Secondary | ICD-10-CM | POA: Diagnosis not present

## 2014-12-01 DIAGNOSIS — N390 Urinary tract infection, site not specified: Secondary | ICD-10-CM | POA: Diagnosis not present

## 2014-12-01 DIAGNOSIS — I6523 Occlusion and stenosis of bilateral carotid arteries: Secondary | ICD-10-CM | POA: Diagnosis not present

## 2014-12-01 DIAGNOSIS — I10 Essential (primary) hypertension: Secondary | ICD-10-CM | POA: Diagnosis not present

## 2014-12-01 DIAGNOSIS — Z85038 Personal history of other malignant neoplasm of large intestine: Secondary | ICD-10-CM | POA: Diagnosis not present

## 2014-12-01 DIAGNOSIS — I63511 Cerebral infarction due to unspecified occlusion or stenosis of right middle cerebral artery: Secondary | ICD-10-CM | POA: Diagnosis present

## 2014-12-01 DIAGNOSIS — I639 Cerebral infarction, unspecified: Secondary | ICD-10-CM | POA: Diagnosis not present

## 2014-12-01 DIAGNOSIS — D696 Thrombocytopenia, unspecified: Secondary | ICD-10-CM | POA: Insufficient documentation

## 2014-12-01 DIAGNOSIS — I272 Other secondary pulmonary hypertension: Secondary | ICD-10-CM | POA: Diagnosis not present

## 2014-12-01 DIAGNOSIS — Z8673 Personal history of transient ischemic attack (TIA), and cerebral infarction without residual deficits: Secondary | ICD-10-CM | POA: Insufficient documentation

## 2014-12-01 DIAGNOSIS — Z7951 Long term (current) use of inhaled steroids: Secondary | ICD-10-CM | POA: Diagnosis not present

## 2014-12-01 DIAGNOSIS — R4781 Slurred speech: Secondary | ICD-10-CM | POA: Diagnosis present

## 2014-12-01 DIAGNOSIS — R29810 Facial weakness: Secondary | ICD-10-CM | POA: Diagnosis present

## 2014-12-01 DIAGNOSIS — I340 Nonrheumatic mitral (valve) insufficiency: Secondary | ICD-10-CM | POA: Diagnosis not present

## 2014-12-01 DIAGNOSIS — Z7901 Long term (current) use of anticoagulants: Secondary | ICD-10-CM | POA: Diagnosis not present

## 2014-12-03 ENCOUNTER — Encounter: Payer: Self-pay | Admitting: Internal Medicine

## 2014-12-04 ENCOUNTER — Encounter: Payer: Self-pay | Admitting: Internal Medicine

## 2014-12-11 ENCOUNTER — Encounter: Payer: Self-pay | Admitting: Internal Medicine

## 2014-12-11 ENCOUNTER — Ambulatory Visit (INDEPENDENT_AMBULATORY_CARE_PROVIDER_SITE_OTHER): Payer: Medicare Other | Admitting: *Deleted

## 2014-12-11 ENCOUNTER — Ambulatory Visit (INDEPENDENT_AMBULATORY_CARE_PROVIDER_SITE_OTHER): Payer: Medicare Other | Admitting: Internal Medicine

## 2014-12-11 VITALS — BP 122/52 | HR 72 | Temp 98.1°F | Resp 16 | Wt 199.0 lb

## 2014-12-11 DIAGNOSIS — E785 Hyperlipidemia, unspecified: Secondary | ICD-10-CM | POA: Diagnosis not present

## 2014-12-11 DIAGNOSIS — G459 Transient cerebral ischemic attack, unspecified: Secondary | ICD-10-CM

## 2014-12-11 DIAGNOSIS — Z5181 Encounter for therapeutic drug level monitoring: Secondary | ICD-10-CM

## 2014-12-11 DIAGNOSIS — I482 Chronic atrial fibrillation, unspecified: Secondary | ICD-10-CM

## 2014-12-11 LAB — POCT INR: INR: 3.6

## 2014-12-11 NOTE — Progress Notes (Signed)
I have reviewed and agree with the plan. 

## 2014-12-11 NOTE — Progress Notes (Signed)
Pre visit review using our clinic review tool, if applicable. No additional management support is needed unless otherwise documented below in the visit note. 

## 2014-12-11 NOTE — Progress Notes (Signed)
   Subjective:    Patient ID: [name], female    DOB: 03/13/1927, 81 y.o.   MRN: [id number]  HPI She was hospitalized 5/27-5/31/16 with possible TIA manifested as acute facial droop on the right, garbled speech,& essentially nonverbal state. Her daughter found her in this state and also noted marked redness around the eyes.   At [facility] MRI 5/30 revealed 2 tiny acute/subacute infarcts in the right frontal lobe and right parietal lobe. CAT scan had not revealed any acute abnormality 5/27, showing only chronic microvascular ischemic changes.  She remained in chronic atrial fibrillation. At the time of event her PT/INR was therapeutic at 2.9.  She was started on atorvastatin at the time of discharge.LDL was 78.  Hyperglycemia noted but A1c was 5.3%.  Review of Systems  She has returned her baseline except for slightly more marked shortness of breath with exertion.  She's had some extrinsic rhinitis symptoms.  She remains generally weak.  She's had some loose bowel movements.  Other than easy bruising she's had no bleeding dyscrasias.  She did have polydipsia (see A1c above).     Objective:   Physical Exam  Pertinent or positive findings include: She has extremely poor dentition with markedly loose lower teeth. Heart rhythm is slightly irregular. She has a faint right carotid bruit. Abdomen is protuberant but nontender. A uriniferous odor suggested. She has 1+ edema right lower extremity. Pedal pulses are decreased    General appearance :adequately nourished; in no distress.In wheelchair. Eyes: No conjunctival inflammation or scleral icterus is present. Oral exam:  Lips and gums are healthy appearing.There is no oropharyngeal erythema or exudate noted. Heart:  No gallop, murmur, click, rub or other extra sounds   Lungs:Chest clear to auscultation; no wheezes, rhonchi,rales ,or rubs present.No increased work of breathing.  Abdomen: bowel sounds normal, soft without  masses, organomegaly or hernias noted.  No guarding or rebound. No flank tenderness to percussion. Vascular : all pulses equal ; no bruits present. Skin:Warm & dry.  Intact without suspicious lesions or rashes ; no tenting or jaundice  Lymphatic: No lymphadenopathy is noted about the head, neck, axilla Neuro: Strength, tone decreased           Assessment & Plan:   #1 status post TIA versus infarcts in the right frontal and parietal lobe (acute vs subacute)  #2 chronic atrial fib  #3 dyslipidemia controlled  Plan: PT/INR will be checked. Lipids will be checked after 10 weeks of the atorvastatin.

## 2014-12-11 NOTE — Patient Instructions (Signed)
  Your next office appointment will be determined based upon review of your pending labs. Those written interpretation of the lab results and instructions will be transmitted to you by My Chart  Critical results will be called.   Followup as needed for any active or acute issue. Please report any significant change in your symptoms. 

## 2014-12-13 ENCOUNTER — Encounter: Payer: Self-pay | Admitting: Internal Medicine

## 2014-12-24 ENCOUNTER — Other Ambulatory Visit: Payer: Self-pay | Admitting: Internal Medicine

## 2014-12-25 ENCOUNTER — Ambulatory Visit (INDEPENDENT_AMBULATORY_CARE_PROVIDER_SITE_OTHER): Payer: Medicare Other | Admitting: General Practice

## 2014-12-25 ENCOUNTER — Other Ambulatory Visit: Payer: Self-pay | Admitting: General Practice

## 2014-12-25 DIAGNOSIS — Z5181 Encounter for therapeutic drug level monitoring: Secondary | ICD-10-CM

## 2014-12-25 DIAGNOSIS — I4891 Unspecified atrial fibrillation: Secondary | ICD-10-CM

## 2014-12-25 LAB — POCT INR: INR: 5.1

## 2014-12-25 MED ORDER — METOPROLOL TARTRATE 25 MG PO TABS: ORAL_TABLET | ORAL | Status: DC

## 2014-12-25 NOTE — Progress Notes (Signed)
Pre visit review using our clinic review tool, if applicable. No additional management support is needed unless otherwise documented below in the visit note. 

## 2014-12-25 NOTE — Progress Notes (Signed)
I have reviewed and agree with the plan. 

## 2014-12-27 ENCOUNTER — Other Ambulatory Visit: Payer: Self-pay | Admitting: Internal Medicine

## 2014-12-28 ENCOUNTER — Other Ambulatory Visit: Payer: Self-pay | Admitting: Emergency Medicine

## 2014-12-28 MED ORDER — VERAPAMIL HCL 120 MG PO TABS
120.0000 mg | ORAL_TABLET | Freq: Two times a day (BID) | ORAL | Status: DC
Start: 2014-12-28 — End: 2015-03-20

## 2014-12-31 ENCOUNTER — Telehealth: Payer: Self-pay | Admitting: Emergency Medicine

## 2014-12-31 ENCOUNTER — Encounter: Payer: Self-pay | Admitting: Internal Medicine

## 2014-12-31 ENCOUNTER — Other Ambulatory Visit: Payer: Self-pay | Admitting: Internal Medicine

## 2014-12-31 ENCOUNTER — Other Ambulatory Visit: Payer: Self-pay

## 2014-12-31 DIAGNOSIS — E785 Hyperlipidemia, unspecified: Secondary | ICD-10-CM

## 2014-12-31 DIAGNOSIS — G459 Transient cerebral ischemic attack, unspecified: Secondary | ICD-10-CM

## 2014-12-31 MED ORDER — ATORVASTATIN CALCIUM 40 MG PO TABS: 40.0000 mg | ORAL_TABLET | Freq: Every day | ORAL | Status: DC

## 2015-01-02 ENCOUNTER — Ambulatory Visit (INDEPENDENT_AMBULATORY_CARE_PROVIDER_SITE_OTHER): Payer: Medicare Other | Admitting: General Practice

## 2015-01-02 DIAGNOSIS — I4891 Unspecified atrial fibrillation: Secondary | ICD-10-CM

## 2015-01-02 DIAGNOSIS — Z5181 Encounter for therapeutic drug level monitoring: Secondary | ICD-10-CM | POA: Diagnosis not present

## 2015-01-02 LAB — POCT INR: INR: 2.5

## 2015-01-02 NOTE — Progress Notes (Signed)
Pre visit review using our clinic review tool, if applicable. No additional management support is needed unless otherwise documented below in the visit note. 

## 2015-01-07 NOTE — Progress Notes (Signed)
I have reviewed and agree with the plan. 

## 2015-01-28 ENCOUNTER — Other Ambulatory Visit: Payer: Self-pay | Admitting: Internal Medicine

## 2015-01-30 ENCOUNTER — Ambulatory Visit (INDEPENDENT_AMBULATORY_CARE_PROVIDER_SITE_OTHER): Payer: Medicare Other | Admitting: General Practice

## 2015-01-30 DIAGNOSIS — I4891 Unspecified atrial fibrillation: Secondary | ICD-10-CM | POA: Diagnosis not present

## 2015-01-30 DIAGNOSIS — Z5181 Encounter for therapeutic drug level monitoring: Secondary | ICD-10-CM | POA: Diagnosis not present

## 2015-01-30 LAB — POCT INR: INR: 1.7

## 2015-01-30 NOTE — Progress Notes (Signed)
Pre visit review using our clinic review tool, if applicable. No additional management support is needed unless otherwise documented below in the visit note. 

## 2015-01-30 NOTE — Progress Notes (Signed)
I have reviewed and agree with the plan. 

## 2015-02-19 ENCOUNTER — Encounter: Payer: Self-pay | Admitting: Internal Medicine

## 2015-02-27 ENCOUNTER — Encounter: Payer: Self-pay | Admitting: Internal Medicine

## 2015-02-27 ENCOUNTER — Other Ambulatory Visit (INDEPENDENT_AMBULATORY_CARE_PROVIDER_SITE_OTHER): Payer: Medicare Other

## 2015-02-27 ENCOUNTER — Ambulatory Visit (INDEPENDENT_AMBULATORY_CARE_PROVIDER_SITE_OTHER): Payer: Medicare Other | Admitting: General Practice

## 2015-02-27 DIAGNOSIS — Z5181 Encounter for therapeutic drug level monitoring: Secondary | ICD-10-CM

## 2015-02-27 DIAGNOSIS — E785 Hyperlipidemia, unspecified: Secondary | ICD-10-CM | POA: Diagnosis not present

## 2015-02-27 LAB — HEPATIC FUNCTION PANEL
ALK PHOS: 56 U/L (ref 39–117)
ALT: 7 U/L (ref 0–35)
AST: 14 U/L (ref 0–37)
Albumin: 4 g/dL (ref 3.5–5.2)
BILIRUBIN TOTAL: 0.8 mg/dL (ref 0.2–1.2)
Bilirubin, Direct: 0.2 mg/dL (ref 0.0–0.3)
Total Protein: 6.9 g/dL (ref 6.0–8.3)

## 2015-02-27 LAB — LIPID PANEL
Cholesterol: 91 mg/dL (ref 0–200)
HDL: 29.1 mg/dL — ABNORMAL LOW (ref >39.00)
LDL CALC: 48 mg/dL (ref 0–99)
NONHDL: 62.25
Total CHOL/HDL Ratio: 3
Triglycerides: 72 mg/dL (ref 0.0–149.0)
VLDL: 14.4 mg/dL (ref 0.0–40.0)

## 2015-02-27 LAB — CK: CK TOTAL: 84 U/L (ref 7–177)

## 2015-02-27 LAB — POCT INR: INR: 1.9

## 2015-02-27 NOTE — Progress Notes (Signed)
I have reviewed and agree with the plan. 

## 2015-02-27 NOTE — Progress Notes (Signed)
Pre visit review using our clinic review tool, if applicable. No additional management support is needed unless otherwise documented below in the visit note. 

## 2015-02-28 NOTE — Telephone Encounter (Signed)
Please advise 

## 2015-03-01 ENCOUNTER — Other Ambulatory Visit: Payer: Self-pay | Admitting: Internal Medicine

## 2015-03-01 MED ORDER — ATORVASTATIN CALCIUM 20 MG PO TABS: 20.0000 mg | ORAL_TABLET | Freq: Every day | ORAL | Status: DC

## 2015-03-04 ENCOUNTER — Encounter: Payer: Self-pay | Admitting: Internal Medicine

## 2015-03-04 ENCOUNTER — Ambulatory Visit: Payer: Medicare Other | Admitting: Cardiovascular Disease

## 2015-03-04 ENCOUNTER — Other Ambulatory Visit: Payer: Self-pay | Admitting: Internal Medicine

## 2015-03-05 ENCOUNTER — Other Ambulatory Visit: Payer: Self-pay | Admitting: Emergency Medicine

## 2015-03-05 MED ORDER — ATORVASTATIN CALCIUM 20 MG PO TABS: 20.0000 mg | ORAL_TABLET | Freq: Every day | ORAL | Status: DC

## 2015-03-18 ENCOUNTER — Other Ambulatory Visit: Payer: Self-pay

## 2015-03-20 ENCOUNTER — Ambulatory Visit (INDEPENDENT_AMBULATORY_CARE_PROVIDER_SITE_OTHER): Payer: Medicare Other | Admitting: Cardiovascular Disease

## 2015-03-20 ENCOUNTER — Encounter: Payer: Self-pay | Admitting: Cardiovascular Disease

## 2015-03-20 VITALS — BP 100/50 | HR 65 | Ht 63.0 in | Wt 173.2 lb

## 2015-03-20 DIAGNOSIS — I340 Nonrheumatic mitral (valve) insufficiency: Secondary | ICD-10-CM | POA: Diagnosis not present

## 2015-03-20 DIAGNOSIS — I480 Paroxysmal atrial fibrillation: Secondary | ICD-10-CM | POA: Diagnosis not present

## 2015-03-20 NOTE — Patient Instructions (Signed)
Medication Instructions:  Your physician recommends that you continue on your current medications as directed. Please refer to the Current Medication list given to you today.   Labwork: none  Testing/Procedures: none  Follow-Up: Your physician wants you to follow-up in:  12 months.  You will receive a reminder letter in the mail two months in advance. If you don't receive a letter, please call our office to schedule the follow-up appointment.        

## 2015-03-20 NOTE — Progress Notes (Signed)
Chief Complaint  Patient presents with  . Shortness of Breath      History of Present Illness: 81 yo female with history of paroxysmal atrial fibrillation, moderate LVH, mild MR, dilated left atrium and right atrium, anemia, DVT/PE s/p IVC filter, colon cancer s/p colectomy here today for follow up. I saw her as a new patient for evaluation of irregular heart rhythm in February 2013. She had palpitations starting in 2011 and was found to be anemic at that time and then her colon cancer was found. This was complicated by DVT and PE. She had an IVC filter placed in December 2011. She was started on coumadin back in 2006 after a traumatic hip injury and this was restarted in October of 2011. She has had no bleeding issues since then. Echo 10/07/11 with normal LV function, mild LVH, mild MR, severe biatrial enlargement, PA pressure 54-8mmHg. She had a CVA in May 2016 and was admitted to [facility]. She completely recovered from her CVA. Her INR was therapeutic at the time. Echo May 2016 at [facility] with normal LV function, severe TR, mild to moderate MR, severe biatrial enlargement. Carotid dopplers at [facility] with no evidence of carotid disease.   She is here today for follow up and doing well. No chest pain, SOB, palpitations, near syncope or syncope. No bleeding issues.   Primary Care Physician: Linna Darner  Past Medical History  Diagnosis Date  . Anemia   . DVT (deep venous thrombosis) 11/06  . Pulmonary embolism 11/06  . Colon cancer 12/11  . Hypertension   . Atrial fibrillation     Past Surgical History  Procedure Laterality Date  . Appendectomy  1952  . Total hip arthroplasty  01/03/07    Left hip replacement.  . Colectomy  06/2010    partial, Dr. Donne Hazel complicated by LLE CVT; S/P IVC umbrella & anemia  . Total abdominal hysterectomy w/ bilateral salpingoophorectomy  1973    For Fibroids  . Vena cava filter placement  06/28/2010  . Hip fracture surgery   2006    Trauma    Current Outpatient Prescriptions  Medication Sig Dispense Refill  . acetaminophen (TYLENOL) 500 MG tablet Take 500 mg by mouth every 8 (eight) hours as needed.    Marland Kitchen ADVAIR DISKUS 100-50 MCG/DOSE AEPB INHALE ONE DOSE BY MOUTH TWICE DAILY 60 each 4  . ALPRAZolam (XANAX) 0.25 MG tablet TAKE ONE TABLET BY MOUTH TWICE DAILY AS NEEDED 30 tablet 0  . aspirin 81 MG tablet Take 81 mg by mouth daily.    Marland Kitchen atorvastatin (LIPITOR) 20 MG tablet Take 1 tablet (20 mg total) by mouth daily. 90 tablet 1  . Calcium Carbonate-Vit D-Min (CALCIUM 1200 PO) Take 1 tablet by mouth daily.     . fentaNYL (DURAGESIC - DOSED MCG/HR) 50 MCG/HR Place 1 patch (50 mcg total) onto the skin every 3 (three) days. 5 patch 0  . furosemide (LASIX) 40 MG tablet TAKE ONE TABLET BY MOUTH ONCE DAILY 30 tablet 5  . metoprolol tartrate (LOPRESSOR) 25 MG tablet TAKE ONE TABLET BY MOUTH TWICE DAILY 180 tablet 0  . Multiple Vitamins-Minerals (MULTIPLE VITAMINS/WOMENS PO) Take by mouth daily.    . promethazine (PHENERGAN) 25 MG tablet TAKE ONE TABLET BY MOUTH EVERY 6 TO 8 HOURS AS NEEDED (Patient taking differently: Take 25 mg by mouth every 4 (four) hours as needed. TAKE ONE TABLET BY MOUTH EVERY 6 TO 8 HOURS AS NEEDED) 5 tablet 0  .  traMADol (ULTRAM) 50 MG tablet Take 1 tablet (50 mg total) by mouth every 6 (six) hours as needed. for pain 30 tablet 0  . verapamil (CALAN) 120 MG tablet Take 1 tablet (120 mg total) by mouth 2 (two) times daily. 180 tablet 3  . warfarin (COUMADIN) 2.5 MG tablet Take as directed by anticoagulation clinic (Patient taking differently: 5 mg daily. Take as directed by anticoagulation clinic) 90 tablet 1   No current facility-administered medications for this visit.    No Known Allergies  Social History   Social History  . Marital Status: Widowed    Spouse Name: N/A  . Number of Children: 1  . Years of Education: N/A   Occupational History  .     Social History Main Topics  .  Smoking status: Never Smoker   . Smokeless tobacco: Not on file  . Alcohol Use: No  . Drug Use: No  . Sexual Activity: Not on file   Other Topics Concern  . Not on file   Social History Narrative    Family History  Problem Relation Age of Onset  . Peripheral vascular disease Father   . Heart attack Mother 61  . Heart disease Mother     MI  . Coronary artery disease Sister   . Diabetes Paternal Grandmother   . Coronary artery disease Maternal Grandfather     Review of Systems:  As stated in the HPI and otherwise negative.   BP 100/50 mmHg  Pulse 65  Ht 5\' 3"  (1.6 m)  Wt 173 lb 3.2 oz (78.563 kg)  BMI 30.69 kg/m2  SpO2 96%  Physical Examination: General: Well developed, well nourished, NAD HEENT: OP clear, mucus membranes moist SKIN: warm, dry. No rashes. Neuro: No focal deficits Musculoskeletal: Muscle strength 5/5 all ext Psychiatric: Mood and affect normal Neck: No JVD, no carotid bruits, no thyromegaly, no lymphadenopathy. Lungs:Clear bilaterally, no wheezes, rhonci, crackles Cardiovascular: Irregular irregular. Systolic murmur. No gallops or rubs. Abdomen:Soft. Bowel sounds present. Non-tender.  Extremities: No lower extremity edema. Pulses are 2 + in the bilateral DP/PT.  Echo May 2016 at [facility]:  The left atrium is severely dilated. The right atrium is severely dilated. Injection of contrast documented no interatrial shunt . There is mild asymmetric left ventricular hypertrophy. The left ventricular ejection fraction is normal (55-60%). Unable to adequately determine diastolic dysfunction. The left ventricular wall motion is normal. The aortic valve is trileaflet. Mild aortic sclerosis is present with good valvular opening. There is moderate to moderately severe (2-3+) tricuspid regurgitation. Right ventricular systolic pressure is elevated at 42mmHg, consistent with severe pulmonary hypertension. There is mild-moderate (1-2+) mitral  regurgitation.  Left Ventricle The left ventricle is normal in size. There is mild asymmetric left ventricular hypertrophy. The left ventricular ejection fraction is normal (55-60%). The left ventricular wall motion is normal. Unable to adequately determine diastolic dysfunction.   Right Ventricle The right ventricle is grossly normal in size and function.  Atria The left atrium is severely dilated. Injection of contrast documented no interatrial shunt . The right atrium is severely dilated.  Mitral Valve The mitral valve is grossly normal. There is mild-moderate (1-2+) mitral regurgitation.   Tricuspid Valve The tricuspid valve is grossly normal. There is moderate to moderately severe (2-3+) tricuspid regurgitation.Right ventricular systolic pressure is elevated at 59mmHg, consistent with severe pulmonary hypertension.  Aortic Valve The aortic valve is trileaflet. Mild aortic sclerosis is present with good valvular opening. There is no aortic stenosis. There is  no aortic regurgitation present.  MMode/2D Measurements & Calculations IVSd: 1.3 cm LVIDd: 4.6 cm LVIDs: 3.1 cm LVPWd: 1.1 cm _____________________________________________________________  LV mass(C)d: 198.3 grams Ao root diam: 3.0 cm LV mass(C)dI: 104.1 grams/m2 Ao root area: 7.0 cm2 LA dimension: 5.1 cm  Doppler Measurements & Calculations MV E max vel: 104.0 cm/sec MV dec time: 0.17 sec MV A max vel: 32.6 cm/sec MV E/A: 3.2  _____________________________________________________________ TR max vel: 382.0 cm/sec RAP systole: 10.0 mmHg TR max PG: 58.4 mmHg RVSP(TR): 68.4 mmHg   EKG:  EKG is ordered today. The ekg ordered today demonstrates Atrial fibrillation, rate 65 bpm. Diffuse ST and T wave abnormalities.   Recent Labs: 08/01/2014: BUN 20; Creatinine, Ser 1.34*; Hemoglobin 13.6; Platelets 126.0*; Potassium 4.3; Sodium 141 02/27/2015: ALT 7   Lipid Panel    Component Value Date/Time   CHOL 91  02/27/2015 1415   TRIG 72.0 02/27/2015 1415   HDL 29.10* 02/27/2015 1415   CHOLHDL 3 02/27/2015 1415   VLDL 14.4 02/27/2015 1415   LDLCALC 48 02/27/2015 1415     Wt Readings from Last 3 Encounters:  03/20/15 173 lb 3.2 oz (78.563 kg)  12/11/14 199 lb (90.266 kg)  06/12/14 202 lb 11.2 oz (91.944 kg)     Other studies Reviewed: Additional studies/ records that were reviewed today include: . Review of the above records demonstrates:    Assessment and Plan:   1. ATRIAL FIBRILLATION: Rate controlled. Cardizem CD stopped for insurance reasons and Verapamil/Lopressor now for rate control. Continue anti-coagulation with coumadin. No bleeding issues. We discussed the novel agents but she does not wish to consider.   2. Mitral regurgitation: Mild to moderate by echo at [place] May 2016 (see Care Everywhere).   3. History of CVA: May 2016. No residual deficits. Her INR was therapeutic at that time. Per pt, neurology added ASA and statin.   Current medicines are reviewed at length with the patient today.  The patient does not have concerns regarding medicines.  The following changes have been made:  no change  Labs/ tests ordered today include:   Orders Placed This Encounter  Procedures  . EKG 12-Lead    Disposition:   FU with me in 12  months  Signed, Lauree Chandler, MD 03/20/2015 11:40 AM    [facility] [address], [place], [place]  [address] Phone: [phone]; Fax: [phone]

## 2015-03-25 ENCOUNTER — Other Ambulatory Visit: Payer: Self-pay | Admitting: Internal Medicine

## 2015-03-27 ENCOUNTER — Ambulatory Visit (INDEPENDENT_AMBULATORY_CARE_PROVIDER_SITE_OTHER): Payer: Medicare Other | Admitting: General Practice

## 2015-03-27 ENCOUNTER — Ambulatory Visit: Payer: Medicare Other

## 2015-03-27 DIAGNOSIS — Z5181 Encounter for therapeutic drug level monitoring: Secondary | ICD-10-CM | POA: Diagnosis not present

## 2015-03-27 DIAGNOSIS — Z23 Encounter for immunization: Secondary | ICD-10-CM

## 2015-03-27 LAB — POCT INR: INR: 1.8

## 2015-03-27 MED ORDER — ALPRAZOLAM 0.25 MG PO TABS: ORAL_TABLET | ORAL | Status: DC

## 2015-03-27 MED ORDER — VERAPAMIL HCL 120 MG PO TABS: 120.0000 mg | ORAL_TABLET | Freq: Two times a day (BID) | ORAL | Status: DC

## 2015-03-27 MED ORDER — TRAMADOL HCL 50 MG PO TABS: 50.0000 mg | ORAL_TABLET | Freq: Four times a day (QID) | ORAL | Status: DC | PRN

## 2015-03-27 NOTE — Progress Notes (Signed)
I have reviewed and agree with the plan. 

## 2015-03-27 NOTE — Addendum Note (Signed)
Addended by: Earnstine Regal on: 03/27/2015 10:49 AM   Modules accepted: Orders

## 2015-03-27 NOTE — Telephone Encounter (Signed)
MD ok x's 1 faxed scripts to [facility]...[name]

## 2015-03-27 NOTE — Progress Notes (Signed)
Pre visit review using our clinic review tool, if applicable. No additional management support is needed unless otherwise documented below in the visit note. 

## 2015-03-29 ENCOUNTER — Encounter: Payer: Self-pay | Admitting: Internal Medicine

## 2015-04-09 ENCOUNTER — Telehealth: Payer: Self-pay | Admitting: Internal Medicine

## 2015-04-09 ENCOUNTER — Encounter: Payer: Self-pay | Admitting: Cardiovascular Disease

## 2015-04-09 ENCOUNTER — Other Ambulatory Visit: Payer: Self-pay | Admitting: General Practice

## 2015-04-09 MED ORDER — WARFARIN SODIUM 2.5 MG PO TABS: ORAL_TABLET | ORAL | Status: DC

## 2015-04-09 NOTE — Telephone Encounter (Signed)
[name], pt daughter, called to see if Dr. Larose Kells would accept pt as new pt/transfer from Dr. Linna Darner. They are closer to this area as well. The pt saw Dr. Larose Kells before and like his beside manner. Please advise it Dr. Larose Kells will accept.

## 2015-04-09 NOTE — Telephone Encounter (Signed)
Okay to transfer, please arrange a 30 minute visit to get established at her earliest convenience.

## 2015-04-09 NOTE — Telephone Encounter (Signed)
Please advise. Pt is Medicare.

## 2015-04-10 NOTE — Telephone Encounter (Signed)
FYI. See below. Thank you.

## 2015-04-12 NOTE — Telephone Encounter (Signed)
Talked with daughter and scheduled appt.

## 2015-04-24 ENCOUNTER — Ambulatory Visit: Payer: Medicare Other

## 2015-04-24 ENCOUNTER — Encounter: Payer: Self-pay | Admitting: Internal Medicine

## 2015-04-24 ENCOUNTER — Ambulatory Visit (INDEPENDENT_AMBULATORY_CARE_PROVIDER_SITE_OTHER): Payer: Medicare Other | Admitting: Internal Medicine

## 2015-04-24 VITALS — BP 128/78 | HR 54 | Temp 98.0°F | Ht 63.0 in | Wt 173.4 lb

## 2015-04-24 DIAGNOSIS — I10 Essential (primary) hypertension: Secondary | ICD-10-CM

## 2015-04-24 DIAGNOSIS — Z23 Encounter for immunization: Secondary | ICD-10-CM | POA: Diagnosis not present

## 2015-04-24 DIAGNOSIS — E785 Hyperlipidemia, unspecified: Secondary | ICD-10-CM

## 2015-04-24 DIAGNOSIS — I4891 Unspecified atrial fibrillation: Secondary | ICD-10-CM | POA: Diagnosis not present

## 2015-04-24 DIAGNOSIS — Z09 Encounter for follow-up examination after completed treatment for conditions other than malignant neoplasm: Secondary | ICD-10-CM

## 2015-04-24 DIAGNOSIS — Z79891 Long term (current) use of opiate analgesic: Secondary | ICD-10-CM | POA: Diagnosis not present

## 2015-04-24 DIAGNOSIS — Z79899 Other long term (current) drug therapy: Secondary | ICD-10-CM | POA: Diagnosis not present

## 2015-04-24 DIAGNOSIS — G4700 Insomnia, unspecified: Secondary | ICD-10-CM | POA: Diagnosis not present

## 2015-04-24 LAB — POCT INR: INR: 1.9

## 2015-04-24 MED ORDER — FUROSEMIDE 40 MG PO TABS: 40.0000 mg | ORAL_TABLET | Freq: Every day | ORAL | Status: DC

## 2015-04-24 MED ORDER — TRAMADOL HCL 50 MG PO TABS: 50.0000 mg | ORAL_TABLET | Freq: Four times a day (QID) | ORAL | Status: DC | PRN

## 2015-04-24 MED ORDER — ALPRAZOLAM 0.25 MG PO TABS: 0.2500 mg | ORAL_TABLET | Freq: Two times a day (BID) | ORAL | Status: DC | PRN

## 2015-04-24 MED ORDER — VERAPAMIL HCL 120 MG PO TABS: 120.0000 mg | ORAL_TABLET | Freq: Two times a day (BID) | ORAL | Status: DC

## 2015-04-24 MED ORDER — METOPROLOL TARTRATE 25 MG PO TABS: 25.0000 mg | ORAL_TABLET | Freq: Two times a day (BID) | ORAL | Status: DC

## 2015-04-24 NOTE — Assessment & Plan Note (Signed)
HTN: Seems well-controlled, refill medications, check a BMP Insomnia: Uses Xanax sporadically, UDS and contract Atrial fibrillation: INR today 1.9, Coumadin 2.5 mg daily. Recommend 2.5 mg qd  Except Wednesday  take 2 tablets.INR in 3 weeks. High cholesterol: Last LDL 48: Well-controlled, LFTs satisfactory, continue statins Primary care: Prevnar today.  Declined a tetanus shot RTC 4 -5 months

## 2015-04-24 NOTE — Progress Notes (Signed)
Subjective:    Patient ID: [name], female    DOB: October 30, 1926, 81 y.o.   MRN: [id number]  DOS:  04/24/2015 Type of visit - description : New patient to me, used to see Dr. Linna Darner, here with her daughter Interval history: Doing well. Atrial fibrillation: Good compliance with medication, due for a Coumadin check HTN: Good compliance of medicines, BP today is very good Anxiety: Xanax as needed, needs a UDS and contract DJD: On ultram, needs a UDS and contract    Review of Systems  denies chest pain, difficulty breathing, lower extremity edema Occasional nausea usually related to car sickness. No   vomiting, blood in the stools. No anxiety or depression. Reports her memory is very good.  Past Medical History  Diagnosis Date  . Anemia   . DVT (deep venous thrombosis) ([place]) 11/06  . Pulmonary embolism ([place]) 11/06  . Colon cancer ([place]) 12/11  . Hypertension   . Atrial fibrillation ([place])   . Frequent headaches   . Heart murmur   . Stroke ([place]) 11/30/2014  . Colon cancer ([place]) 2011    Past Surgical History  Procedure Laterality Date  . Appendectomy  1952  . Total hip arthroplasty  01/03/07    Left hip replacement.  . Colectomy  06/2010    partial, Dr. Donne Hazel complicated by LLE CVT; S/P IVC umbrella & anemia  . Total abdominal hysterectomy w/ bilateral salpingoophorectomy  1973    For Fibroids  . Vena cava filter placement  06/28/2010  . Hip fracture surgery  2006    Trauma    Social History   Social History  . Marital Status: Widowed    Spouse Name: N/A  . Number of Children: 1  . Years of Education: N/A   Occupational History  . n/d    Social History Main Topics  . Smoking status: Never Smoker   . Smokeless tobacco: Not on file  . Alcohol Use: No  . Drug Use: No  . Sexual Activity: Not on file   Other Topics Concern  . Not on file   Social History Narrative   Lives w/ her daughter         Medication List       This list is accurate as of:  04/24/15  5:56 PM.  Always use your most recent med list.               acetaminophen 500 MG tablet  Commonly known as:  TYLENOL  Take 500 mg by mouth every 8 (eight) hours as needed.     ADVAIR DISKUS 100-50 MCG/DOSE Aepb  Generic drug:  Fluticasone-Salmeterol  INHALE ONE DOSE BY MOUTH TWICE DAILY     ALPRAZolam 0.25 MG tablet  Commonly known as:  XANAX  Take 1 tablet (0.25 mg total) by mouth 2 (two) times daily as needed for anxiety.     aspirin 81 MG tablet  Take 81 mg by mouth daily.     atorvastatin 20 MG tablet  Commonly known as:  LIPITOR  Take 1 tablet (20 mg total) by mouth daily.     CALCIUM 1200 PO  Take 1 tablet by mouth daily.     furosemide 40 MG tablet  Commonly known as:  LASIX  Take 1 tablet (40 mg total) by mouth daily.     metoprolol tartrate 25 MG tablet  Commonly known as:  LOPRESSOR  Take 1 tablet (25 mg total) by mouth 2 (two) times daily.  MULTIPLE VITAMINS/WOMENS PO  Take by mouth daily.     promethazine 25 MG tablet  Commonly known as:  PHENERGAN  TAKE ONE TABLET BY MOUTH EVERY 6 TO 8 HOURS AS NEEDED     traMADol 50 MG tablet  Commonly known as:  ULTRAM  Take 1 tablet (50 mg total) by mouth every 6 (six) hours as needed. for pain     verapamil 120 MG tablet  Commonly known as:  CALAN  Take 1 tablet (120 mg total) by mouth 2 (two) times daily.     warfarin 2.5 MG tablet  Commonly known as:  COUMADIN  Take as directed by anticoagulation clinic           Objective:   Physical Exam BP 128/78 mmHg  Pulse 54  Temp(Src) 98 F (36.7 C) (Oral)  Ht 5\' 3"  (1.6 m)  Wt 173 lb 6 oz (78.642 kg)  BMI 30.72 kg/m2  SpO2 96% General:   Well developed, well nourished . Elderly lady, sitting in a wheelchair in  NAD.  HEENT:  Normocephalic . Face symmetric, atraumatic Lungs:  CTA B Normal respiratory effort, no intercostal retractions, no accessory muscle use. Heart: Irregularly irregular  No pretibial edema bilaterally  Skin: Not  pale. Not jaundice Neurologic:  alert & oriented X3.  Speech normal, gait appropriate for age and unassisted Psych--  Cognition and judgment appear intact.  Cooperative with normal attention span and concentration.  Behavior appropriate. No anxious or depressed appearing. Seems in good spirits     Assessment & Plan:   Assessment> HTN insomnia - xanax prn Asthma  Atrial fibrillation--rate controlled, anticoag Stroke 11-2014, no residual deficits (TIA?) Heart murmur: Mitral regurgitation DVT and pulmonary emboli 2006, vena cava filter 2011  MSK: tramadol qd  --DJD --Back pain has seen Dr Nelva Bush, dr Rolena Infante, MRI: djd, stenosis, med treatment Colon cancer 2011 -- Dr Annitta Jersey   Plan HTN: Seems well-controlled, refill medications, check a BMP Insomnia: Uses Xanax sporadically, UDS and contract Atrial fibrillation: INR today 1.9, Coumadin 2.5 mg daily. Recommend 2.5 mg qd  Except Wednesday  take 2 tablets.INR in 3 weeks. High cholesterol: Last LDL 48: Well-controlled, LFTs satisfactory, continue statins Primary care: Prevnar today.  Declined a tetanus shot RTC 4 -5 months

## 2015-04-24 NOTE — Progress Notes (Signed)
Pre visit review using our clinic review tool, if applicable. No additional management support is needed unless otherwise documented below in the visit note. 

## 2015-04-24 NOTE — Patient Instructions (Addendum)
Get your blood work before you leave  We also need a UDS  Coumadin 2.5 mg: 1 tablet a day except Wednesday take 2 tablets Next coumadin check 3 weeks, here, make an appointment   Next visit  for a check up in 3-4 months, no fasting     (30 minutes) Please schedule an appointment at the front desk

## 2015-04-25 LAB — BASIC METABOLIC PANEL
BUN: 17 mg/dL (ref 6–23)
CALCIUM: 9.3 mg/dL (ref 8.4–10.5)
CHLORIDE: 106 meq/L (ref 96–112)
CO2: 28 mEq/L (ref 19–32)
CREATININE: 1.33 mg/dL — AB (ref 0.40–1.20)
GFR: 40.17 mL/min — ABNORMAL LOW (ref >60.00)
GLUCOSE: 112 mg/dL — AB (ref 70–99)
Potassium: 3.8 mEq/L (ref 3.5–5.1)
Sodium: 144 mEq/L (ref 135–145)

## 2015-04-30 ENCOUNTER — Telehealth: Payer: Self-pay

## 2015-04-30 NOTE — Telephone Encounter (Signed)
UDS: 04/24/2015   Negative for Alprazolam:PRN Positive for Tramadol   Low risk per Dr. Larose Kells 04/30/2015.

## 2015-05-15 ENCOUNTER — Ambulatory Visit (INDEPENDENT_AMBULATORY_CARE_PROVIDER_SITE_OTHER): Payer: Medicare Other

## 2015-05-15 DIAGNOSIS — I4891 Unspecified atrial fibrillation: Secondary | ICD-10-CM

## 2015-05-15 DIAGNOSIS — Z5181 Encounter for therapeutic drug level monitoring: Secondary | ICD-10-CM | POA: Diagnosis not present

## 2015-05-15 LAB — POCT INR: INR: 2.8

## 2015-05-15 NOTE — Progress Notes (Signed)
Pre visit review using our clinic review tool, if applicable. No additional management support is needed unless otherwise documented below in the visit note. \Patient in for INR. INR=2.8

## 2015-05-15 NOTE — Patient Instructions (Addendum)
  Patient to take 2.5mg  daily except Wednesday take 5mg . Return for recheck in 4 weeks.

## 2015-06-13 ENCOUNTER — Ambulatory Visit (HOSPITAL_BASED_OUTPATIENT_CLINIC_OR_DEPARTMENT_OTHER): Payer: Medicare Other | Admitting: Oncology

## 2015-06-13 ENCOUNTER — Ambulatory Visit (INDEPENDENT_AMBULATORY_CARE_PROVIDER_SITE_OTHER): Payer: Medicare Other

## 2015-06-13 VITALS — BP 144/57 | HR 61 | Temp 97.7°F | Resp 17 | Ht 63.0 in | Wt 173.7 lb

## 2015-06-13 DIAGNOSIS — Z5181 Encounter for therapeutic drug level monitoring: Secondary | ICD-10-CM

## 2015-06-13 DIAGNOSIS — Z85038 Personal history of other malignant neoplasm of large intestine: Secondary | ICD-10-CM

## 2015-06-13 DIAGNOSIS — I4891 Unspecified atrial fibrillation: Secondary | ICD-10-CM | POA: Diagnosis not present

## 2015-06-13 DIAGNOSIS — C189 Malignant neoplasm of colon, unspecified: Secondary | ICD-10-CM

## 2015-06-13 LAB — POCT INR: INR: 2.7

## 2015-06-13 NOTE — Progress Notes (Signed)
Pre visit review using our clinic review tool, if applicable. No additional management support is needed unless otherwise documented below in the visit note.  Patient in for INR.   INR = 2.7 which is within goal of 2.0-3.0

## 2015-06-13 NOTE — Patient Instructions (Signed)
Take 2.5 mg daily except for Wednessday take 5 mg. Return in 4 weeks for re-check.

## 2015-06-13 NOTE — Progress Notes (Signed)
  [place] OFFICE PROGRESS NOTE   Diagnosis: Colon cancer  INTERVAL HISTORY:   Ms. Desmidt returns as scheduled. She reports being admitted to for site hospital in May with a CVA. This affected her vision. She lost her appetite after this event and has lost 30 pounds. No difficulty with bowel function. No bleeding. Chronic back pain. She underwent a renal CT study in February to evaluate right flank pain. A hyperdense left mid kidney lesion was noted. A renal ultrasound confirmed a hyperdense cyst in the left kidney.  Objective:  Vital signs in last 24 hours:  Blood pressure 144/57, pulse 61, temperature 97.7 F (36.5 C), temperature source Oral, resp. rate 17, height 5\' 3"  (1.6 m), SpO2 98 %.    HEENT: Neck without mass Lymphatics: No cervical, supra-clavicular, axillary, or inguinal nodes Resp: Lungs clear bilaterally Cardio: Irregular GI: No hepatomegaly, no mass, nontender Vascular: No leg edema   Medications: I have reviewed the patient's current medications.  Assessment/Plan: 1. Stage I (T2 N0) colon cancer status post right colectomy 06/17/2010.  Colonoscopy 06/16/2010 showed a 3 cm, nearly circumferential, ulcerated, friable mass in the cecum. The mass could not be biopsied. Otherwise normal examination of the colon. External hemorrhoids.  CEA 06/17/2010 0.8.  Status post right colectomy 06/17/2010. Final pathology showed invasive adenocarcinoma, moderately differentiated, spanning 5 cm extending into but not through the muscularis propria; surgical resection margins negative; no evidence of cancer in 31 of 31 lymph nodes. No lymph-vascular invasion identified. No perineural invasion identified. 2. Atrial fibrillation. 3. History of DVT/PE 2006. 4. Bilateral lower extremity venous duplex 06/12/2010 with chronic deep vein thrombosis involving the right lower extremity and left lower extremity. 5. Status post IVC filter placement  06/28/2010.    Disposition:  Ms. Ferrence remains in clinical remission from colon cancer. She has a good prognosis for a long-term disease-free survival. She was discharged from the Oncology clinic today. I am available to see her in the future as needed. She will follow-up with Dr. Larose Kells for continued weight loss.  Betsy Coder, MD  06/13/2015  3:59 PM

## 2015-06-25 ENCOUNTER — Encounter: Payer: Self-pay | Admitting: Internal Medicine

## 2015-07-11 ENCOUNTER — Other Ambulatory Visit: Payer: Self-pay | Admitting: Internal Medicine

## 2015-07-11 ENCOUNTER — Ambulatory Visit: Payer: Medicare Other

## 2015-07-12 ENCOUNTER — Other Ambulatory Visit: Payer: Self-pay | Admitting: Internal Medicine

## 2015-07-12 NOTE — Telephone Encounter (Signed)
Pt needing advair and now seeing Dr. Larose Kells. Request routed thru Mychart to Dr. Linna Darner.

## 2015-07-12 NOTE — Telephone Encounter (Signed)
Rx sent 

## 2015-07-16 ENCOUNTER — Ambulatory Visit: Payer: Medicare Other

## 2015-07-17 ENCOUNTER — Ambulatory Visit (INDEPENDENT_AMBULATORY_CARE_PROVIDER_SITE_OTHER): Payer: Medicare Other | Admitting: Behavioral Health

## 2015-07-17 DIAGNOSIS — I4891 Unspecified atrial fibrillation: Secondary | ICD-10-CM

## 2015-07-17 DIAGNOSIS — Z5181 Encounter for therapeutic drug level monitoring: Secondary | ICD-10-CM

## 2015-07-17 LAB — POCT INR: INR: 2.3

## 2015-07-17 NOTE — Progress Notes (Signed)
Pre visit review using our clinic review tool, if applicable. No additional management support is needed unless otherwise documented below in the visit note. 

## 2015-07-17 NOTE — Patient Instructions (Signed)
Per Dr. Larose Kells: Continue taking 2.5 mg daily, except for Wednesday take 5 mg. Return in 4 weeks for INR re-check.

## 2015-08-08 ENCOUNTER — Telehealth: Payer: Self-pay | Admitting: Internal Medicine

## 2015-08-08 NOTE — Telephone Encounter (Signed)
Advair was sent to [facility] on 07/12/2015 for 6 months.

## 2015-08-08 NOTE — Telephone Encounter (Signed)
Pharmacy: [facility] [place], [place]  Reason for call: Daughter called for Advair. Pt is out. Please send in today.

## 2015-08-14 ENCOUNTER — Encounter: Payer: Self-pay | Admitting: Internal Medicine

## 2015-08-14 ENCOUNTER — Ambulatory Visit (INDEPENDENT_AMBULATORY_CARE_PROVIDER_SITE_OTHER): Payer: Medicare Other | Admitting: Internal Medicine

## 2015-08-14 VITALS — BP 126/74 | HR 55 | Temp 98.1°F | Ht 63.0 in | Wt 175.1 lb

## 2015-08-14 DIAGNOSIS — J4521 Mild intermittent asthma with (acute) exacerbation: Secondary | ICD-10-CM | POA: Diagnosis not present

## 2015-08-14 DIAGNOSIS — I4891 Unspecified atrial fibrillation: Secondary | ICD-10-CM

## 2015-08-14 DIAGNOSIS — Z7901 Long term (current) use of anticoagulants: Secondary | ICD-10-CM

## 2015-08-14 DIAGNOSIS — E785 Hyperlipidemia, unspecified: Secondary | ICD-10-CM

## 2015-08-14 DIAGNOSIS — J45909 Unspecified asthma, uncomplicated: Secondary | ICD-10-CM | POA: Insufficient documentation

## 2015-08-14 LAB — POCT INR: INR: 3.3

## 2015-08-14 MED ORDER — METOPROLOL TARTRATE 25 MG PO TABS: 25.0000 mg | ORAL_TABLET | Freq: Two times a day (BID) | ORAL | Status: DC

## 2015-08-14 MED ORDER — VERAPAMIL HCL 120 MG PO TABS: 120.0000 mg | ORAL_TABLET | Freq: Two times a day (BID) | ORAL | Status: DC

## 2015-08-14 MED ORDER — PREDNISONE 10 MG PO TABS: 10.0000 mg | ORAL_TABLET | Freq: Every day | ORAL | Status: DC

## 2015-08-14 MED ORDER — FUROSEMIDE 40 MG PO TABS: 40.0000 mg | ORAL_TABLET | Freq: Every day | ORAL | Status: DC

## 2015-08-14 MED ORDER — ALBUTEROL SULFATE HFA 108 (90 BASE) MCG/ACT IN AERS: 2.0000 | INHALATION_SPRAY | Freq: Four times a day (QID) | RESPIRATORY_TRACT | Status: DC | PRN

## 2015-08-14 MED ORDER — ALPRAZOLAM 0.25 MG PO TABS: 0.2500 mg | ORAL_TABLET | Freq: Two times a day (BID) | ORAL | Status: DC | PRN

## 2015-08-14 MED ORDER — WARFARIN SODIUM 2.5 MG PO TABS: ORAL_TABLET | ORAL | Status: DC

## 2015-08-14 MED ORDER — TRAMADOL HCL 50 MG PO TABS: 50.0000 mg | ORAL_TABLET | Freq: Four times a day (QID) | ORAL | Status: DC | PRN

## 2015-08-14 NOTE — Patient Instructions (Addendum)
GO TO THE FRONT DESK  --Schedule labs to be done within few days (fasting)  --Schedule a Coumadin check 3 weeks from today. Continue taking the same Coumadin  --Schedule a routine office visit or check up to be done in  4 months , no   fasting  Front desk--   30    For your asthma: Continue Advair Take prednisone for 5 days Take Robitussin-DM as needed for cough If you cough or wheeze frequently, try the rescue inhaler, albuterol up to 4 times a day. If not better let me know

## 2015-08-14 NOTE — Progress Notes (Signed)
Subjective:    Patient ID: [name], female    DOB: 02/15/1927, 82 y.o.   MRN: [id number]  DOS:  08/14/2015 Type of visit - description : Routine visit Interval history: Needs  INR check. Asthma: Got a URI last month, symptoms improved but then resurface a couple weeks ago: Cough above baseline, chest congestion. No sputum production. Uses Advair consistently, does not have a rescue inhaler High cholesterol: Wonders if she needs to adjust her medication.  Wt Readings from Last 3 Encounters:  08/14/15 175 lb 2 oz (79.436 kg)  06/13/15 173 lb 11.2 oz (78.79 kg)  04/24/15 173 lb 6 oz (78.642 kg)     Review of Systems No fevers or chills. Minimal sinus pain or congestion.   Past Medical History  Diagnosis Date  . Anemia   . DVT (deep venous thrombosis) ([place]) 11/06  . Pulmonary embolism ([place]) 11/06  . Colon cancer ([place]) 12/11  . Hypertension   . Atrial fibrillation ([place])   . Frequent headaches   . Heart murmur   . Stroke ([place]) 11/30/2014  . Colon cancer ([place]) 2011    Past Surgical History  Procedure Laterality Date  . Appendectomy  1952  . Total hip arthroplasty  01/03/07    Left hip replacement.  . Colectomy  06/2010    partial, Dr. Donne Hazel complicated by LLE CVT; S/P IVC umbrella & anemia  . Total abdominal hysterectomy w/ bilateral salpingoophorectomy  1973    For Fibroids  . Vena cava filter placement  06/28/2010  . Hip fracture surgery  2006    Trauma    Social History   Social History  . Marital Status: Widowed    Spouse Name: N/A  . Number of Children: 1  . Years of Education: N/A   Occupational History  . n/d    Social History Main Topics  . Smoking status: Never Smoker   . Smokeless tobacco: Not on file  . Alcohol Use: No  . Drug Use: No  . Sexual Activity: Not on file   Other Topics Concern  . Not on file   Social History Narrative   Lives w/ her daughter         Medication List       This list is accurate as of: 08/14/15  5:15  PM.  Always use your most recent med list.               acetaminophen 500 MG tablet  Commonly known as:  TYLENOL  Take 500 mg by mouth every 8 (eight) hours as needed.     albuterol 108 (90 Base) MCG/ACT inhaler  Commonly known as:  VENTOLIN HFA  Inhale 2 puffs into the lungs every 6 (six) hours as needed for wheezing or shortness of breath.     ALPRAZolam 0.25 MG tablet  Commonly known as:  XANAX  Take 1 tablet (0.25 mg total) by mouth 2 (two) times daily as needed for anxiety.     aspirin 81 MG tablet  Take 81 mg by mouth daily.     atorvastatin 20 MG tablet  Commonly known as:  LIPITOR  Take 1 tablet (20 mg total) by mouth daily.     CALCIUM 1200 PO  Take 1 tablet by mouth daily.     Fluticasone-Salmeterol 100-50 MCG/DOSE Aepb  Commonly known as:  ADVAIR DISKUS  Inhale 1 puff into the lungs 2 (two) times daily.     furosemide 40 MG tablet  Commonly known  as:  LASIX  Take 1 tablet (40 mg total) by mouth daily.     metoprolol tartrate 25 MG tablet  Commonly known as:  LOPRESSOR  Take 1 tablet (25 mg total) by mouth 2 (two) times daily.     MULTIPLE VITAMINS/WOMENS PO  Take by mouth daily.     predniSONE 10 MG tablet  Commonly known as:  DELTASONE  Take 1 tablet (10 mg total) by mouth daily. 2 tabs a day x 5 days     promethazine 25 MG tablet  Commonly known as:  PHENERGAN  TAKE ONE TABLET BY MOUTH EVERY 6 TO 8 HOURS AS NEEDED     traMADol 50 MG tablet  Commonly known as:  ULTRAM  Take 1 tablet (50 mg total) by mouth every 6 (six) hours as needed. for pain     verapamil 120 MG tablet  Commonly known as:  CALAN  Take 1 tablet (120 mg total) by mouth 2 (two) times daily.     warfarin 2.5 MG tablet  Commonly known as:  COUMADIN  Take as directed by [facility].           Objective:   Physical Exam BP 126/74 mmHg  Pulse 55  Temp(Src) 98.1 F (36.7 C) (Oral)  Ht 5\' 3"  (1.6 m)  Wt 175 lb 2 oz (79.436 kg)  BMI 31.03 kg/m2  SpO2 95% General:     Well developed, well nourished . NAD.  HEENT:  Normocephalic . Face symmetric, atraumatic. Nose not congested, throat symmetric, declined the ear exam. Lungs:  Very decreased breath sounds, no increased work of breathing or crackles. Normal respiratory effort, no intercostal retractions, no accessory muscle use. Heart: irregular.  Skin: Not pale. Not jaundice Neurologic:  alert & oriented X3.  Speech normal, gait appropriate for age and unassisted Psych--  Cognition and judgment appear intact.  Cooperative with normal attention span and concentration.  Behavior appropriate. No anxious or depressed appearing.      Assessment & Plan:   Assessment> HTN Hyperlipidemia insomnia - xanax prn Asthma  Atrial fibrillation--rate controlled, anticoag Stroke 11-2014, no residual deficits (TIA?) Heart murmur: Mitral regurgitation DVT and pulmonary emboli 2006, vena cava filter 2011  MSK: tramadol qd  --DJD --Back pain has seen Dr Nelva Bush, dr Rolena Infante, MRI: djd, stenosis, med treatment Colon cancer 2011 -- Dr Annitta Jersey , d/c from oncology 06-2015   PLAN HTN: Well-controlled, recent BMP satisfactory  hyperlipidemia: Lipitor decreased from 40 mg to 20 mg based on last FLP, recheck FLP. Asthma: With mild exacerbation, recommend Mucinex DM, prednisone for 5 days, rescue inhaler prescribed today. Call if not better Anticoagulation: INR 3.3. Coumadin: 2.5 daily, 5 mg Wednesdays; previous 2.3, wide variation. Plan: No change, recheck in 3 weeks. RTC 4 months

## 2015-08-14 NOTE — Progress Notes (Signed)
Pre visit review using our clinic review tool, if applicable. No additional management support is needed unless otherwise documented below in the visit note. 

## 2015-08-18 ENCOUNTER — Encounter: Payer: Self-pay | Admitting: Internal Medicine

## 2015-08-20 ENCOUNTER — Other Ambulatory Visit: Payer: Self-pay | Admitting: Internal Medicine

## 2015-08-20 MED ORDER — PREDNISONE 10 MG PO TABS: 10.0000 mg | ORAL_TABLET | Freq: Every day | ORAL | Status: DC

## 2015-08-21 ENCOUNTER — Other Ambulatory Visit: Payer: Medicare Other

## 2015-08-28 ENCOUNTER — Other Ambulatory Visit: Payer: Self-pay | Admitting: Internal Medicine

## 2015-08-28 NOTE — Telephone Encounter (Signed)
Patient is establishing care with dr paz---routing rx refill request to new pcp----please advise, thanks

## 2015-08-29 ENCOUNTER — Encounter: Payer: Self-pay | Admitting: Internal Medicine

## 2015-08-30 ENCOUNTER — Inpatient Hospital Stay (HOSPITAL_COMMUNITY)
Admission: EM | Admit: 2015-08-30 | Discharge: 2015-09-03 | DRG: 150 | Disposition: A | Discharge status: Skilled Nursing Facility | Payer: Medicare Other | Attending: Internal Medicine | Admitting: Internal Medicine

## 2015-08-30 ENCOUNTER — Emergency Department (HOSPITAL_COMMUNITY): Payer: Medicare Other

## 2015-08-30 ENCOUNTER — Inpatient Hospital Stay (HOSPITAL_COMMUNITY): Payer: Medicare Other

## 2015-08-30 ENCOUNTER — Encounter (HOSPITAL_COMMUNITY): Payer: Self-pay | Admitting: Emergency Medicine

## 2015-08-30 DIAGNOSIS — E785 Hyperlipidemia, unspecified: Secondary | ICD-10-CM

## 2015-08-30 DIAGNOSIS — R2681 Unsteadiness on feet: Secondary | ICD-10-CM | POA: Diagnosis not present

## 2015-08-30 DIAGNOSIS — I129 Hypertensive chronic kidney disease with stage 1 through stage 4 chronic kidney disease, or unspecified chronic kidney disease: Secondary | ICD-10-CM | POA: Diagnosis present

## 2015-08-30 DIAGNOSIS — M25512 Pain in left shoulder: Secondary | ICD-10-CM | POA: Diagnosis present

## 2015-08-30 DIAGNOSIS — M79602 Pain in left arm: Secondary | ICD-10-CM

## 2015-08-30 DIAGNOSIS — R52 Pain, unspecified: Secondary | ICD-10-CM

## 2015-08-30 DIAGNOSIS — R05 Cough: Secondary | ICD-10-CM | POA: Diagnosis not present

## 2015-08-30 DIAGNOSIS — J189 Pneumonia, unspecified organism: Secondary | ICD-10-CM | POA: Diagnosis not present

## 2015-08-30 DIAGNOSIS — R404 Transient alteration of awareness: Secondary | ICD-10-CM | POA: Diagnosis not present

## 2015-08-30 DIAGNOSIS — N183 Chronic kidney disease, stage 3 (moderate): Secondary | ICD-10-CM | POA: Diagnosis present

## 2015-08-30 DIAGNOSIS — R609 Edema, unspecified: Secondary | ICD-10-CM | POA: Diagnosis not present

## 2015-08-30 DIAGNOSIS — Z8673 Personal history of transient ischemic attack (TIA), and cerebral infarction without residual deficits: Secondary | ICD-10-CM | POA: Diagnosis not present

## 2015-08-30 DIAGNOSIS — Z7901 Long term (current) use of anticoagulants: Secondary | ICD-10-CM

## 2015-08-30 DIAGNOSIS — Z7982 Long term (current) use of aspirin: Secondary | ICD-10-CM

## 2015-08-30 DIAGNOSIS — I4891 Unspecified atrial fibrillation: Secondary | ICD-10-CM | POA: Diagnosis not present

## 2015-08-30 DIAGNOSIS — Z86711 Personal history of pulmonary embolism: Secondary | ICD-10-CM | POA: Diagnosis not present

## 2015-08-30 DIAGNOSIS — T45515A Adverse effect of anticoagulants, initial encounter: Secondary | ICD-10-CM | POA: Diagnosis present

## 2015-08-30 DIAGNOSIS — R531 Weakness: Secondary | ICD-10-CM | POA: Diagnosis not present

## 2015-08-30 DIAGNOSIS — R791 Abnormal coagulation profile: Secondary | ICD-10-CM | POA: Diagnosis not present

## 2015-08-30 DIAGNOSIS — K5900 Constipation, unspecified: Secondary | ICD-10-CM | POA: Diagnosis present

## 2015-08-30 DIAGNOSIS — Z96642 Presence of left artificial hip joint: Secondary | ICD-10-CM | POA: Diagnosis present

## 2015-08-30 DIAGNOSIS — Z86718 Personal history of other venous thrombosis and embolism: Secondary | ICD-10-CM

## 2015-08-30 DIAGNOSIS — R0602 Shortness of breath: Secondary | ICD-10-CM | POA: Diagnosis not present

## 2015-08-30 DIAGNOSIS — M6281 Muscle weakness (generalized): Secondary | ICD-10-CM | POA: Diagnosis not present

## 2015-08-30 DIAGNOSIS — R040 Epistaxis: Principal | ICD-10-CM | POA: Diagnosis present

## 2015-08-30 DIAGNOSIS — S4992XA Unspecified injury of left shoulder and upper arm, initial encounter: Secondary | ICD-10-CM | POA: Diagnosis not present

## 2015-08-30 DIAGNOSIS — I482 Chronic atrial fibrillation: Secondary | ICD-10-CM | POA: Diagnosis not present

## 2015-08-30 DIAGNOSIS — I10 Essential (primary) hypertension: Secondary | ICD-10-CM | POA: Diagnosis not present

## 2015-08-30 DIAGNOSIS — R51 Headache: Secondary | ICD-10-CM | POA: Diagnosis not present

## 2015-08-30 DIAGNOSIS — Z85038 Personal history of other malignant neoplasm of large intestine: Secondary | ICD-10-CM

## 2015-08-30 DIAGNOSIS — R042 Hemoptysis: Secondary | ICD-10-CM | POA: Diagnosis not present

## 2015-08-30 DIAGNOSIS — S40012A Contusion of left shoulder, initial encounter: Secondary | ICD-10-CM | POA: Diagnosis not present

## 2015-08-30 DIAGNOSIS — M19022 Primary osteoarthritis, left elbow: Secondary | ICD-10-CM | POA: Diagnosis not present

## 2015-08-30 DIAGNOSIS — Z79899 Other long term (current) drug therapy: Secondary | ICD-10-CM

## 2015-08-30 DIAGNOSIS — R262 Difficulty in walking, not elsewhere classified: Secondary | ICD-10-CM | POA: Diagnosis not present

## 2015-08-30 LAB — CBC WITH DIFFERENTIAL/PLATELET
BASOS ABS: 0 10*3/uL (ref 0.0–0.1)
Basophils Relative: 0 %
EOS PCT: 0 %
Eosinophils Absolute: 0 10*3/uL (ref 0.0–0.7)
HEMATOCRIT: 39 % (ref 36.0–46.0)
HEMOGLOBIN: 13 g/dL (ref 12.0–15.0)
LYMPHS ABS: 1.1 10*3/uL (ref 0.7–4.0)
LYMPHS PCT: 9 %
MCH: 29 pg (ref 26.0–34.0)
MCHC: 33.3 g/dL (ref 30.0–36.0)
MCV: 87.1 fL (ref 78.0–100.0)
Monocytes Absolute: 0.8 10*3/uL (ref 0.1–1.0)
Monocytes Relative: 7 %
NEUTROS ABS: 9.7 10*3/uL — AB (ref 1.7–7.7)
Neutrophils Relative %: 84 %
PLATELETS: 154 10*3/uL (ref 150–400)
RBC: 4.48 MIL/uL (ref 3.87–5.11)
RDW: 14.3 % (ref 11.5–15.5)
WBC: 11.6 10*3/uL — AB (ref 4.0–10.5)

## 2015-08-30 LAB — TYPE AND SCREEN
ABO/RH(D): A POS
Antibody Screen: NEGATIVE

## 2015-08-30 LAB — COMPREHENSIVE METABOLIC PANEL
ALT: 9 U/L — AB (ref 14–54)
AST: 19 U/L (ref 15–41)
Albumin: 2.8 g/dL — ABNORMAL LOW (ref 3.5–5.0)
Alkaline Phosphatase: 73 U/L (ref 38–126)
Anion gap: 11 (ref 5–15)
BUN: 24 mg/dL — ABNORMAL HIGH (ref 6–20)
CHLORIDE: 108 mmol/L (ref 101–111)
CO2: 23 mmol/L (ref 22–32)
CREATININE: 1.18 mg/dL — AB (ref 0.44–1.00)
Calcium: 8.8 mg/dL — ABNORMAL LOW (ref 8.9–10.3)
GFR calc non Af Amer: 41 mL/min — ABNORMAL LOW (ref >60)
GFR, EST AFRICAN AMERICAN: 47 mL/min — AB (ref >60)
Glucose, Bld: 165 mg/dL — ABNORMAL HIGH (ref 65–99)
POTASSIUM: 3.1 mmol/L — AB (ref 3.5–5.1)
SODIUM: 142 mmol/L (ref 135–145)
Total Bilirubin: 1.2 mg/dL (ref 0.3–1.2)
Total Protein: 6.7 g/dL (ref 6.5–8.1)

## 2015-08-30 LAB — URINALYSIS, ROUTINE W REFLEX MICROSCOPIC
Bilirubin Urine: NEGATIVE
Glucose, UA: 100 mg/dL — AB
Ketones, ur: NEGATIVE mg/dL
LEUKOCYTES UA: NEGATIVE
NITRITE: NEGATIVE
PH: 5.5 (ref 5.0–8.0)
Protein, ur: 100 mg/dL — AB
SPECIFIC GRAVITY, URINE: 1.04 — AB (ref 1.005–1.030)

## 2015-08-30 LAB — PROTIME-INR
INR: >10 (ref 0.00–1.49)
Prothrombin Time: >90 seconds — ABNORMAL HIGH (ref 11.6–15.2)

## 2015-08-30 LAB — URINE MICROSCOPIC-ADD ON: WBC, UA: NONE SEEN WBC/hpf (ref 0–5)

## 2015-08-30 LAB — INFLUENZA PANEL BY PCR (TYPE A & B)
H1N1FLUPCR: NOT DETECTED
INFLAPCR: NEGATIVE
INFLBPCR: NEGATIVE

## 2015-08-30 LAB — I-STAT CG4 LACTIC ACID, ED: LACTIC ACID, VENOUS: 1.53 mmol/L (ref 0.5–2.0)

## 2015-08-30 LAB — POC OCCULT BLOOD, ED: FECAL OCCULT BLD: NEGATIVE

## 2015-08-30 LAB — TROPONIN I

## 2015-08-30 LAB — STREP PNEUMONIAE URINARY ANTIGEN: Strep Pneumo Urinary Antigen: NEGATIVE

## 2015-08-30 MED ORDER — SODIUM CHLORIDE 0.9 % IV BOLUS (SEPSIS)
500.0000 mL | Freq: Once | INTRAVENOUS | Status: AC
  Administered 2015-08-30: 500 mL via INTRAVENOUS

## 2015-08-30 MED ORDER — ACETAMINOPHEN 500 MG PO TABS
500.0000 mg | ORAL_TABLET | Freq: Three times a day (TID) | ORAL | Status: DC | PRN
  Administered 2015-08-31: 500 mg via ORAL
  Filled 2015-08-30: qty 1

## 2015-08-30 MED ORDER — DEXTROSE 5 % IV SOLN
500.0000 mg | INTRAVENOUS | Status: DC
  Administered 2015-08-31 – 2015-09-02 (×3): 500 mg via INTRAVENOUS
  Filled 2015-08-30 (×5): qty 500

## 2015-08-30 MED ORDER — VERAPAMIL HCL 120 MG PO TABS
120.0000 mg | ORAL_TABLET | Freq: Two times a day (BID) | ORAL | Status: DC
  Administered 2015-08-30 – 2015-09-03 (×8): 120 mg via ORAL
  Filled 2015-08-30 (×9): qty 1

## 2015-08-30 MED ORDER — CALCIUM 1200 1200-1000 MG-UNIT PO CHEW: CHEWABLE_TABLET | Freq: Every day | ORAL | Status: DC

## 2015-08-30 MED ORDER — FENTANYL 50 MCG/HR TD PT72
50.0000 ug | MEDICATED_PATCH | TRANSDERMAL | Status: DC
  Administered 2015-08-30 – 2015-09-02 (×2): 50 ug via TRANSDERMAL
  Filled 2015-08-30 (×2): qty 1

## 2015-08-30 MED ORDER — WHITE PETROLATUM GEL
Status: DC | PRN
  Filled 2015-08-30: qty 28.35

## 2015-08-30 MED ORDER — ALBUTEROL SULFATE (2.5 MG/3ML) 0.083% IN NEBU
2.5000 mg | INHALATION_SOLUTION | Freq: Four times a day (QID) | RESPIRATORY_TRACT | Status: DC | PRN
  Administered 2015-08-31: 2.5 mg via RESPIRATORY_TRACT
  Filled 2015-08-30: qty 3

## 2015-08-30 MED ORDER — MOMETASONE FURO-FORMOTEROL FUM 100-5 MCG/ACT IN AERO
2.0000 | INHALATION_SPRAY | Freq: Two times a day (BID) | RESPIRATORY_TRACT | Status: DC
  Administered 2015-08-31 – 2015-09-03 (×4): 2 via RESPIRATORY_TRACT
  Filled 2015-08-30 (×2): qty 8.8

## 2015-08-30 MED ORDER — ALPRAZOLAM 0.25 MG PO TABS
0.2500 mg | ORAL_TABLET | Freq: Two times a day (BID) | ORAL | Status: DC | PRN
  Administered 2015-08-30: 0.25 mg via ORAL
  Filled 2015-08-30: qty 1

## 2015-08-30 MED ORDER — IOHEXOL 350 MG/ML SOLN
80.0000 mL | Freq: Once | INTRAVENOUS | Status: AC | PRN
  Administered 2015-08-30: 80 mL via INTRAVENOUS

## 2015-08-30 MED ORDER — ACETAMINOPHEN 325 MG PO TABS: 650.0000 mg | ORAL_TABLET | Freq: Four times a day (QID) | ORAL | Status: DC | PRN

## 2015-08-30 MED ORDER — ATORVASTATIN CALCIUM 20 MG PO TABS
20.0000 mg | ORAL_TABLET | Freq: Every day | ORAL | Status: DC
  Administered 2015-08-30 – 2015-09-02 (×4): 20 mg via ORAL
  Filled 2015-08-30 (×4): qty 1

## 2015-08-30 MED ORDER — FUROSEMIDE 40 MG PO TABS
40.0000 mg | ORAL_TABLET | Freq: Every day | ORAL | Status: DC
  Administered 2015-08-31: 40 mg via ORAL
  Filled 2015-08-30 (×2): qty 1

## 2015-08-30 MED ORDER — METOPROLOL TARTRATE 25 MG PO TABS
25.0000 mg | ORAL_TABLET | Freq: Two times a day (BID) | ORAL | Status: DC
  Administered 2015-08-30 – 2015-09-03 (×8): 25 mg via ORAL
  Filled 2015-08-30 (×8): qty 1

## 2015-08-30 MED ORDER — ALBUTEROL SULFATE (2.5 MG/3ML) 0.083% IN NEBU: 2.5000 mg | INHALATION_SOLUTION | RESPIRATORY_TRACT | Status: DC | PRN

## 2015-08-30 MED ORDER — DEXTROSE 5 % IV SOLN
1.0000 g | INTRAVENOUS | Status: DC
  Administered 2015-08-31 – 2015-09-02 (×3): 1 g via INTRAVENOUS
  Filled 2015-08-30 (×4): qty 10

## 2015-08-30 MED ORDER — ALBUTEROL SULFATE HFA 108 (90 BASE) MCG/ACT IN AERS: 2.0000 | INHALATION_SPRAY | Freq: Four times a day (QID) | RESPIRATORY_TRACT | Status: DC | PRN

## 2015-08-30 MED ORDER — DEXTROSE 5 % IV SOLN
500.0000 mg | Freq: Once | INTRAVENOUS | Status: AC
  Administered 2015-08-30: 500 mg via INTRAVENOUS
  Filled 2015-08-30: qty 500

## 2015-08-30 MED ORDER — VITAMIN K1 10 MG/ML IJ SOLN
10.0000 mg | Freq: Once | INTRAVENOUS | Status: AC
  Administered 2015-08-30: 10 mg via INTRAVENOUS
  Filled 2015-08-30: qty 1

## 2015-08-30 MED ORDER — PROTHROMBIN COMPLEX CONC HUMAN 500 UNITS IV KIT: 50.0000 [IU]/kg | PACK | INTRAVENOUS | Status: DC

## 2015-08-30 MED ORDER — ATORVASTATIN CALCIUM 20 MG PO TABS: 20.0000 mg | ORAL_TABLET | Freq: Every day | ORAL | Status: DC

## 2015-08-30 MED ORDER — ACETAMINOPHEN 650 MG RE SUPP
650.0000 mg | Freq: Four times a day (QID) | RECTAL | Status: DC | PRN
Start: 2015-08-30 — End: 2015-08-30

## 2015-08-30 MED ORDER — SODIUM CHLORIDE 0.9 % IV SOLN
Freq: Once | INTRAVENOUS | Status: AC
  Administered 2015-08-30: 10 mL/h via INTRAVENOUS

## 2015-08-30 MED ORDER — DEXTROSE 5 % IV SOLN
1.0000 g | INTRAVENOUS | Status: DC
  Administered 2015-08-30 – 2015-08-31 (×2): 1 g via INTRAVENOUS
  Filled 2015-08-30 (×3): qty 10

## 2015-08-30 MED ORDER — TRAMADOL HCL 50 MG PO TABS
50.0000 mg | ORAL_TABLET | Freq: Four times a day (QID) | ORAL | Status: DC | PRN
  Administered 2015-08-31 – 2015-09-03 (×5): 50 mg via ORAL
  Filled 2015-08-30 (×5): qty 1

## 2015-08-30 MED ORDER — ASPIRIN 81 MG PO TABS: 81.0000 mg | ORAL_TABLET | Freq: Every day | ORAL | Status: DC

## 2015-08-30 MED ORDER — CALCIUM CARBONATE-VITAMIN D 500-200 MG-UNIT PO TABS
2.0000 | ORAL_TABLET | Freq: Every day | ORAL | Status: DC
  Administered 2015-08-31 – 2015-09-03 (×4): 2 via ORAL
  Filled 2015-08-30 (×3): qty 2
  Filled 2015-08-30: qty 1

## 2015-08-30 NOTE — ED Notes (Signed)
Report called to [address], [place], [place].

## 2015-08-30 NOTE — ED Provider Notes (Signed)
CSN: [id number]     Arrival date & time 08/30/15  [id number] History   First MD Initiated Contact with Patient 08/30/15 1000     Chief Complaint  Patient presents with  . Weakness  . Epistaxis     (Consider location/radiation/quality/duration/timing/severity/associated sxs/prior Treatment) HPI  82 year old female presents with a chief complaint of epistaxis. Had a small amount of bleeding last night. Call their PCP who told him to hold their Coumadin last night. This morning shortly after waking up the patient had much more bleeding. Patient was also spitting up blood. Bleeding stopped shortly after EMS arrival. Patient and daughter endorse the patient has been having a cough and a viral respiratory illness for about 3 weeks. The cough has had some blood over the last couple days. No fevers. Patient is feeling diffusely weak and fatigued, has had a very poor appetite over these last couple weeks. Last INR check was a couple weeks ago and was 3.1. Daughter notes that when she was changing the patient's depends this morning it was a small amount of blood in the diaper. No known rectal bleeding otherwise. Patient is also complaining of left shoulder pain, has been having pain since last night. Daughter thinks she may been trying to push up on her shoulder while trying to get up and cough. No other obvious trauma. No chest pain. History of shoulder problems in past.  Past Medical History  Diagnosis Date  . Anemia   . DVT (deep venous thrombosis) ([place]) 11/06  . Pulmonary embolism ([place]) 11/06  . Colon cancer ([place]) 12/11  . Hypertension   . Atrial fibrillation ([place])   . Frequent headaches   . Heart murmur   . Stroke ([place]) 11/30/2014  . Colon cancer ([place]) 2011   Past Surgical History  Procedure Laterality Date  . Appendectomy  1952  . Total hip arthroplasty  01/03/07    Left hip replacement.  . Colectomy  06/2010    partial, Dr. Donne Hazel complicated by LLE CVT; S/P IVC umbrella & anemia  . Total  abdominal hysterectomy w/ bilateral salpingoophorectomy  1973    For Fibroids  . Vena cava filter placement  06/28/2010  . Hip fracture surgery  2006    Trauma   Family History  Problem Relation Age of Onset  . Peripheral vascular disease Father   . Heart attack Mother 75  . Heart disease Mother     MI  . Coronary artery disease Sister   . Diabetes Paternal Grandmother   . Coronary artery disease Maternal Grandfather    Social History  Substance Use Topics  . Smoking status: Never Smoker   . Smokeless tobacco: None  . Alcohol Use: No   OB History    No data available     Review of Systems  Constitutional: Positive for fatigue.  HENT: Positive for nosebleeds.   Respiratory: Positive for cough. Negative for shortness of breath.   Gastrointestinal: Positive for vomiting. Negative for abdominal pain.  Musculoskeletal: Positive for joint swelling and arthralgias.  Neurological: Positive for weakness.  All other systems reviewed and are negative.     Allergies  Review of patient's allergies indicates no known allergies.  Home Medications   Prior to Admission medications   Medication Sig Start Date End Date Taking? Authorizing Provider  acetaminophen (TYLENOL) 500 MG tablet Take 500 mg by mouth every 8 (eight) hours as needed.    Historical Provider, MD  albuterol (VENTOLIN HFA) 108 (90 Base) MCG/ACT inhaler Inhale 2 puffs  into the lungs every 6 (six) hours as needed for wheezing or shortness of breath. 08/14/15   Colon Branch, MD  ALPRAZolam Duanne Moron) 0.25 MG tablet Take 1 tablet (0.25 mg total) by mouth 2 (two) times daily as needed for anxiety. 08/14/15   Colon Branch, MD  aspirin 81 MG tablet Take 81 mg by mouth daily.    Historical Provider, MD  atorvastatin (LIPITOR) 20 MG tablet Take 1 tablet (20 mg total) by mouth daily. 08/28/15   Colon Branch, MD  Calcium Carbonate-Vit D-Min (CALCIUM 1200 PO) Take 1 tablet by mouth daily.     Historical Provider, MD  Fluticasone-Salmeterol  (ADVAIR DISKUS) 100-50 MCG/DOSE AEPB Inhale 1 puff into the lungs 2 (two) times daily. 07/12/15   Colon Branch, MD  furosemide (LASIX) 40 MG tablet Take 1 tablet (40 mg total) by mouth daily. 08/14/15   Colon Branch, MD  metoprolol tartrate (LOPRESSOR) 25 MG tablet Take 1 tablet (25 mg total) by mouth 2 (two) times daily. 08/14/15   Colon Branch, MD  Multiple Vitamins-Minerals (MULTIPLE VITAMINS/WOMENS PO) Take by mouth daily.    Historical Provider, MD  predniSONE (DELTASONE) 10 MG tablet Take 1 tablet (10 mg total) by mouth daily. 2 tabs a day x 5 days 08/20/15   Colon Branch, MD  promethazine (PHENERGAN) 25 MG tablet TAKE ONE TABLET BY MOUTH EVERY 6 TO 8 HOURS AS NEEDED Patient taking differently: Take 25 mg by mouth every 4 (four) hours as needed. TAKE ONE TABLET BY MOUTH EVERY 6 TO 8 HOURS AS NEEDED 05/11/14   Hendricks Limes, MD  traMADol (ULTRAM) 50 MG tablet Take 1 tablet (50 mg total) by mouth every 6 (six) hours as needed. for pain 08/14/15   Colon Branch, MD  verapamil (CALAN) 120 MG tablet Take 1 tablet (120 mg total) by mouth 2 (two) times daily. 08/14/15   Colon Branch, MD  warfarin (COUMADIN) 2.5 MG tablet Take as directed by Coumadin Clinic. 08/14/15   Colon Branch, MD   BP 167/77 mmHg  Pulse 126  Temp(Src) 97.9 F (36.6 C)  Resp 22  Ht 5\' 3"  (1.6 m)  Wt 175 lb (79.379 kg)  BMI 31.01 kg/m2  SpO2 94% Physical Exam  Constitutional: She is oriented to person, place, and time. She appears well-developed and well-nourished.  HENT:  Head: Normocephalic and atraumatic.  Right Ear: External ear normal.  Left Ear: External ear normal.  Dried blood on lips. Dried blood in bilateral nares, no obvious source  Eyes: Right eye exhibits no discharge. Left eye exhibits no discharge.  Cardiovascular: Normal heart sounds.  An irregular rhythm present. Tachycardia present.   HR 100s-110s  Pulmonary/Chest: Effort normal and breath sounds normal.  Abdominal: Soft. There is no tenderness.  Musculoskeletal:        Left shoulder: She exhibits decreased range of motion, tenderness and swelling. She exhibits no deformity.       Arms: Neurological: She is alert and oriented to person, place, and time.  Skin: Skin is warm and dry.  Nursing note and vitals reviewed.   ED Course  Procedures (including critical care time) Labs Review Labs Reviewed  COMPREHENSIVE METABOLIC PANEL - Abnormal; Notable for the following:    Potassium 3.1 (*)    Glucose, Bld 165 (*)    BUN 24 (*)    Creatinine, Ser 1.18 (*)    Calcium 8.8 (*)    Albumin 2.8 (*)  ALT 9 (*)    GFR calc non Af Amer 41 (*)    GFR calc Af Amer 47 (*)    All other components within normal limits  CBC WITH DIFFERENTIAL/PLATELET - Abnormal; Notable for the following:    WBC 11.6 (*)    Neutro Abs 9.7 (*)    All other components within normal limits  PROTIME-INR - Abnormal; Notable for the following:    Prothrombin Time >90.0 (*)    INR >10.00 (*)    All other components within normal limits  CULTURE, BLOOD (ROUTINE X 2)  CULTURE, BLOOD (ROUTINE X 2)  CULTURE, EXPECTORATED SPUTUM-ASSESSMENT  GRAM STAIN  TROPONIN I  URINALYSIS, ROUTINE W REFLEX MICROSCOPIC (NOT AT [facility])  INFLUENZA PANEL BY PCR (TYPE A & B, H1N1)  HIV ANTIBODY (ROUTINE TESTING)  STREP PNEUMONIAE URINARY ANTIGEN  POC OCCULT BLOOD, ED  I-STAT CG4 LACTIC ACID, ED  PREPARE FRESH FROZEN PLASMA  TYPE AND SCREEN    Imaging Review Dg Chest 2 View  08/30/2015  CLINICAL DATA:  Cough with epistaxis EXAM: CHEST  2 VIEW COMPARISON:  August 06, 2011 FINDINGS: There is no appreciable edema or consolidation. Heart is mildly enlarged with pulmonary vascularity within normal limits. No adenopathy. Bones are osteoporotic. There is degenerative change in thoracic spine. No apparent pneumothorax. IMPRESSION: Mild cardiac enlargement.  No edema or consolidation. Electronically Signed   By: Lowella Grip III M.D.   On: 08/30/2015 10:58   Ct Head Wo Contrast  08/30/2015  CLINICAL  DATA:  General body weakness, loss of appetite for 2-3 weeks. Headache. Elevated INR. EXAM: CT HEAD WITHOUT CONTRAST TECHNIQUE: Contiguous axial images were obtained from the base of the skull through the vertex without intravenous contrast. COMPARISON:  08/10/2004 FINDINGS: There is atrophy and chronic small vessel disease changes. No acute intracranial abnormality. Specifically, no hemorrhage, hydrocephalus, mass lesion, acute infarction, or significant intracranial injury. No acute calvarial abnormality. Visualized paranasal sinuses and mastoids clear. Orbital soft tissues unremarkable. IMPRESSION: No acute intracranial abnormality. Atrophy, chronic microvascular disease. Electronically Signed   By: Rolm Baptise M.D.   On: 08/30/2015 14:12   Ct Angio Chest Pe W/cm &/or Wo Cm  08/30/2015  CLINICAL DATA:  Coughing up blood x day or two. Sob on exertion for a while per pt. EXAM: CT ANGIOGRAPHY CHEST WITH CONTRAST TECHNIQUE: Multidetector CT imaging of the chest was performed using the standard protocol during bolus administration of intravenous contrast. Multiplanar CT image reconstructions and MIPs were obtained to evaluate the vascular anatomy. CONTRAST:  68mL OMNIPAQUE IOHEXOL 350 MG/ML SOLN COMPARISON:  06/16/2010 FINDINGS: Vascular: Right arm IV contrast injection. The SVC is patent. Right atrial enlargement. Mild biventricular enlargement. Dilated central pulmonary arteries. Satisfactory opacification of pulmonary arteries noted, and there is no evidence of pulmonary emboli. Patent bilateral pulmonary veins. Left atrial enlargement. Scattered coronary calcifications. Adequate contrast opacification of the thoracic aorta with no evidence of dissection, aneurysm, or stenosis. There is classic 3-vessel brachiocephalic arch anatomy without proximal stenosis. Calcified plaque in the arch and descending thoracic aorta. Mediastinum/Lymph Nodes: No pericardial effusion. No hilar or mediastinal adenopathy.  Lungs/Pleura: Dense airspace consolidation in basilar segments of the right lower lobe. Scattered airspace opacities in the posterior and medial basal segments left lower lobe, superior segment right lower lobe, and posterior right middle lobe. Scattered poorly marginated nodular sub solid opacities throughout the right upper lobe and lingula. These airspace opacities were not present on prior scan. No pleural effusion. Upper abdomen: Atheromatous aorta.  No  acute abnormality. Musculoskeletal: Bridging osteophytes across multiple contiguous levels in the mid thoracic spine. Spondylitic changes in the lower thoracic spine. Degenerative changes in bilateral shoulders. Review of the MIP images confirms the above findings. IMPRESSION: 1. Negative for acute PE or thoracic aortic dissection. 2. Extensive airspace disease most marked in the right lower lobe, suspect infectious/inflammatory process. Consider follow-up to confirm appropriate resolution . 3. Four-chamber cardiac enlargement with dilated central pulmonary arteries suggesting pulmonary hypertension. 4. Atherosclerosis, including aortic and coronary artery disease. Please note that although the presence of coronary artery calcium documents the presence of coronary artery disease, the severity of this disease and any potential stenosis cannot be assessed on this non-gated CT examination. Assessment for potential risk factor modification, dietary therapy or pharmacologic therapy may be warranted, if clinically indicated. Electronically Signed   By: Lucrezia Europe M.D.   On: 08/30/2015 12:54   Dg Shoulder Left  08/30/2015  CLINICAL DATA:  nosebleed onset yesterday and spitting blood. Pt also c/o generalized weakness and decrease appetite x 3 weeks. left shoulder pain worsening x 2 weeks. Denies injury. Pt is unable to move her left arm at all without sharp pain, scapula was tender during imaging as well. Unable to raise arm for lateral imaging. Lateral chest xray  attempted with arm down to capture shoulder injury as well. EXAM: LEFT SHOULDER - 2+ VIEW COMPARISON:  None. FINDINGS: There is no evidence of fracture or dislocation. There is no evidence of arthropathy or other focal bone abnormality. Spurring in the visualized mid thoracic spine. Atheromatous aortic arch. Patchy axillary artery calcifications. Soft tissues are unremarkable. IMPRESSION: Negative. Electronically Signed   By: Lucrezia Europe M.D.   On: 08/30/2015 10:58   I have personally reviewed and evaluated these images and lab results as part of my medical decision-making.   EKG Interpretation   Date/Time:  Friday August 30 2015 10:02:33 EST Ventricular Rate:  114 PR Interval:    QRS Duration: 104 QT Interval:  352 QTC Calculation: 485 R Axis:   -50 Text Interpretation:  Atrial fibrillation Left anterior fascicular block  Abnormal R-wave progression, late transition LVH with secondary  repolarization abnormality ST depression V1-V3, suggest recording  posterior leads Confirmed by Kadia Abaya  MD, Halea Lieb ([id number]) on 08/30/2015  10:06:56 AM      MDM   Final diagnoses:  CAP (community acquired pneumonia)  Supratherapeutic INR  Epistaxis    Patient presents with a suprapubic INR and subsequent epistaxis. She is spitting and vomiting up blood but nothing this is from the epistaxis itself. Given that she has been coughing up some blood a CTA was obtained and shows no PE but does show a pneumonia. Low-grade fever here with elevated white blood cell count. Will treat with IV antibiotics. She is not having life-threatening bleeding, bleeding has currently stopped. Thus will treat with IV vitamin K and admitted to the hospitalist for observation and serial hemoglobins. Developed a headache while in ED, CT shows no acute bleeding. No neuro findings.    Sherwood Gambler, MD 08/30/15 [phone]

## 2015-08-30 NOTE — H&P (Signed)
Patient Demographics  [name], is a 82 y.o. female  MRN: [id number]   DOB - 19-Jan-1927  Admit Date - 08/30/2015  Outpatient Primary MD for the patient is Kathlene November, MD   With History of -  Past Medical History  Diagnosis Date  . Anemia   . DVT (deep venous thrombosis) ([place]) 11/06  . Pulmonary embolism ([place]) 11/06  . Colon cancer ([place]) 12/11  . Hypertension   . Atrial fibrillation ([place])   . Frequent headaches   . Heart murmur   . Stroke ([place]) 11/30/2014  . Colon cancer ([place]) 2011      Past Surgical History  Procedure Laterality Date  . Appendectomy  1952  . Total hip arthroplasty  01/03/07    Left hip replacement.  . Colectomy  06/2010    partial, Dr. Donne Hazel complicated by LLE CVT; S/P IVC umbrella & anemia  . Total abdominal hysterectomy w/ bilateral salpingoophorectomy  1973    For Fibroids  . Vena cava filter placement  06/28/2010  . Hip fracture surgery  2006    Trauma    in for   Chief Complaint  Patient presents with  . Weakness  . Epistaxis     HPI  [name]  is a 83 y.o. female, with past medical history of hyperlipidemia, hypertension, CVA, A. fib on warfarin, history of DVT/PE in the past, patient presents with complaints of epistaxis, or last 24 hours, she called her PCP, who asked her to hold her warfarin last night,, bleeding continues to happen, so she came to ED, no further epistaxis in ED, workup in ED significant for coagulopathy with INR> 10, she received 10 of IV vitamin K in ED, patient had CT head without contrast due to headache, with no evidence of bleed, she had CTA chest with no evidence of PE, marked for extensive right lower lobe disease, she endorses cough, productive, febrile at 100.7 in ED, I was called to admit.    Review of Systems    In addition to the HPI above, Reports fever and chills No Headache, No changes with Vision or hearing, No problems swallowing food or Liquids, reports epistaxis No Chest pain,  complains of productive cough, denies Shortness of Breath, No Abdominal pain, No Nausea or Vommitting, Bowel movements are regular, No Blood in stool or Urine, No dysuria, No new skin rashes or bruises, No new joints pains-aches,  No new weakness, tingling, numbness in any extremity, No recent weight gain or loss, No polyuria, polydypsia or polyphagia, No significant Mental Stressors.  A full 10 point Review of Systems was done, except as stated above, all other Review of Systems were negative.   Social History Social History  Substance Use Topics  . Smoking status: Never Smoker   . Smokeless tobacco: Not on file  . Alcohol Use: No     Family History Family History  Problem Relation Age of Onset  . Peripheral vascular disease Father   . Heart attack Mother 33  . Heart disease Mother     MI  . Coronary artery disease Sister   . Diabetes Paternal Grandmother   . Coronary artery disease Maternal Grandfather      Prior to Admission medications   Medication Sig Start Date End Date Taking? Authorizing Provider  acetaminophen (TYLENOL) 500 MG tablet Take 500 mg by mouth every 8 (eight) hours as needed for mild pain.    Yes Historical Provider, MD  albuterol (VENTOLIN HFA) 108 (90 Base)  MCG/ACT inhaler Inhale 2 puffs into the lungs every 6 (six) hours as needed for wheezing or shortness of breath. 08/14/15  Yes Colon Branch, MD  ALPRAZolam Duanne Moron) 0.25 MG tablet Take 1 tablet (0.25 mg total) by mouth 2 (two) times daily as needed for anxiety. 08/14/15  Yes Colon Branch, MD  aspirin 81 MG tablet Take 81 mg by mouth daily.   Yes Historical Provider, MD  atorvastatin (LIPITOR) 20 MG tablet Take 1 tablet (20 mg total) by mouth daily. 08/28/15  Yes Colon Branch, MD  Calcium Carbonate-Vit D-Min (CALCIUM 1200 PO) Take 1 tablet by mouth daily.    Yes Historical Provider, MD  fentaNYL (DURAGESIC - DOSED MCG/HR) 50 MCG/HR Place 50 mcg onto the skin every 3 (three) days.   Yes Historical Provider, MD    Fluticasone-Salmeterol (ADVAIR DISKUS) 100-50 MCG/DOSE AEPB Inhale 1 puff into the lungs 2 (two) times daily. 07/12/15  Yes Colon Branch, MD  furosemide (LASIX) 40 MG tablet Take 1 tablet (40 mg total) by mouth daily. 08/14/15  Yes Colon Branch, MD  metoprolol tartrate (LOPRESSOR) 25 MG tablet Take 1 tablet (25 mg total) by mouth 2 (two) times daily. 08/14/15  Yes Colon Branch, MD  Multiple Vitamins-Minerals (MULTIPLE VITAMINS/WOMENS PO) Take by mouth daily.   Yes Historical Provider, MD  promethazine (PHENERGAN) 25 MG tablet TAKE ONE TABLET BY MOUTH EVERY 6 TO 8 HOURS AS NEEDED Patient taking differently: Take 25 mg by mouth every 4 (four) hours as needed. TAKE ONE TABLET BY MOUTH EVERY 6 TO 8 HOURS AS NEEDED 05/11/14  Yes Hendricks Limes, MD  traMADol (ULTRAM) 50 MG tablet Take 1 tablet (50 mg total) by mouth every 6 (six) hours as needed. for pain 08/14/15  Yes Colon Branch, MD  verapamil (CALAN) 120 MG tablet Take 1 tablet (120 mg total) by mouth 2 (two) times daily. 08/14/15  Yes Colon Branch, MD  warfarin (COUMADIN) 2.5 MG tablet Take as directed by [facility]. Patient taking differently: Take 2.5-5 mg by mouth See admin instructions. Pt takes 2.5mg  daily except Wednesdays, takes 5mg  on Wednesdays 08/14/15  Yes Colon Branch, MD    No Known Allergies  Physical Exam  Vitals  Blood pressure 142/67, pulse 100, temperature 99.1 F (37.3 C), temperature source Oral, resp. rate 23, height 5\' 3"  (1.6 m), weight 79.379 kg (175 lb), SpO2 96 %.   1. General frail elderly female lying in bed in NAD,   2. Normal affect and insight, Not Suicidal or Homicidal, Awake Alert, Oriented X 3.  3. No F.N deficits, ALL C.Nerves Intact, Strength 5/5 all 4 extremities, Sensation intact all 4 extremities, Plantars down going.  4. Ears and Eyes appear Normal, dried blood in nostrils and mouth Conjunctivae clear, PERRLA.  5. Supple Neck, No JVD, No cervical lymphadenopathy appriciated, No Carotid Bruits.  6. Symmetrical  Chest wall movement, Good air movement bilaterally,  7. , No Gallops, Rubs or Murmurs, No Parasternal Heave.  8. Positive Bowel Sounds, Abdomen Soft, No tenderness, No organomegaly appriciated,No rebound -guarding or rigidity.  9.  No Cyanosis, Normal Skin Turgor, No Skin Rash or Bruise.  10. Good muscle tone,  joints appear normal , no effusions, Normal ROM.  11. No Palpable Lymph Nodes in Neck or Axillae    Data Review  CBC  Recent Labs Lab 08/30/15 1110  WBC 11.6*  HGB 13.0  HCT 39.0  PLT 154  MCV 87.1  MCH 29.0  MCHC 33.3  RDW 14.3  LYMPHSABS 1.1  MONOABS 0.8  EOSABS 0.0  BASOSABS 0.0   ------------------------------------------------------------------------------------------------------------------  Chemistries   Recent Labs Lab 08/30/15 1110  NA 142  K 3.1*  CL 108  CO2 23  GLUCOSE 165*  BUN 24*  CREATININE 1.18*  CALCIUM 8.8*  AST 19  ALT 9*  ALKPHOS 73  BILITOT 1.2   ------------------------------------------------------------------------------------------------------------------ estimated creatinine clearance is 34.1 mL/min (by C-G formula based on Cr of 1.18). ------------------------------------------------------------------------------------------------------------------ No results for input(s): TSH, T4TOTAL, T3FREE, THYROIDAB in the last 72 hours.  Invalid input(s): FREET3   Coagulation profile  Recent Labs Lab 08/30/15 1110  INR >10.00*   ------------------------------------------------------------------------------------------------------------------- No results for input(s): DDIMER in the last 72 hours. -------------------------------------------------------------------------------------------------------------------  Cardiac Enzymes  Recent Labs Lab 08/30/15 1110  TROPONINI <0.03   ------------------------------------------------------------------------------------------------------------------ Invalid input(s):  POCBNP   ---------------------------------------------------------------------------------------------------------------  Urinalysis    Component Value Date/Time   COLORURINE YELLOW 08/01/2014 [place] 08/01/2014 1555   LABSPEC >=1.030* 08/01/2014 1555   PHURINE 5.5 08/01/2014 [place] 08/01/2014 [place] 06/11/2010 2145   HGBUR MODERATE* 08/01/2014 1555   BILIRUBINUR SMALL* 08/01/2014 1555   KETONESUR TRACE* 08/01/2014 1555   PROTEINUR NEGATIVE 06/11/2010 2145   UROBILINOGEN 1.0 08/01/2014 1555   NITRITE NEGATIVE 08/01/2014 1555   LEUKOCYTESUR NEGATIVE 08/01/2014 1555    ----------------------------------------------------------------------------------------------------------------  Imaging results:   Dg Chest 2 View  08/30/2015  CLINICAL DATA:  Cough with epistaxis EXAM: CHEST  2 VIEW COMPARISON:  August 06, 2011 FINDINGS: There is no appreciable edema or consolidation. Heart is mildly enlarged with pulmonary vascularity within normal limits. No adenopathy. Bones are osteoporotic. There is degenerative change in thoracic spine. No apparent pneumothorax. IMPRESSION: Mild cardiac enlargement.  No edema or consolidation. Electronically Signed   By: Lowella Grip III M.D.   On: 08/30/2015 10:58   Ct Head Wo Contrast  08/30/2015  CLINICAL DATA:  General body weakness, loss of appetite for 2-3 weeks. Headache. Elevated INR. EXAM: CT HEAD WITHOUT CONTRAST TECHNIQUE: Contiguous axial images were obtained from the base of the skull through the vertex without intravenous contrast. COMPARISON:  08/10/2004 FINDINGS: There is atrophy and chronic small vessel disease changes. No acute intracranial abnormality. Specifically, no hemorrhage, hydrocephalus, mass lesion, acute infarction, or significant intracranial injury. No acute calvarial abnormality. Visualized paranasal sinuses and mastoids clear. Orbital soft tissues unremarkable. IMPRESSION:  No acute intracranial abnormality. Atrophy, chronic microvascular disease. Electronically Signed   By: Rolm Baptise M.D.   On: 08/30/2015 14:12   Ct Angio Chest Pe W/cm &/or Wo Cm  08/30/2015  CLINICAL DATA:  Coughing up blood x day or two. Sob on exertion for a while per pt. EXAM: CT ANGIOGRAPHY CHEST WITH CONTRAST TECHNIQUE: Multidetector CT imaging of the chest was performed using the standard protocol during bolus administration of intravenous contrast. Multiplanar CT image reconstructions and MIPs were obtained to evaluate the vascular anatomy. CONTRAST:  72mL OMNIPAQUE IOHEXOL 350 MG/ML SOLN COMPARISON:  06/16/2010 FINDINGS: Vascular: Right arm IV contrast injection. The SVC is patent. Right atrial enlargement. Mild biventricular enlargement. Dilated central pulmonary arteries. Satisfactory opacification of pulmonary arteries noted, and there is no evidence of pulmonary emboli. Patent bilateral pulmonary veins. Left atrial enlargement. Scattered coronary calcifications. Adequate contrast opacification of the thoracic aorta with no evidence of dissection, aneurysm, or stenosis. There is classic 3-vessel brachiocephalic arch anatomy without proximal stenosis. Calcified plaque in the arch and descending thoracic aorta. Mediastinum/Lymph Nodes: No  pericardial effusion. No hilar or mediastinal adenopathy. Lungs/Pleura: Dense airspace consolidation in basilar segments of the right lower lobe. Scattered airspace opacities in the posterior and medial basal segments left lower lobe, superior segment right lower lobe, and posterior right middle lobe. Scattered poorly marginated nodular sub solid opacities throughout the right upper lobe and lingula. These airspace opacities were not present on prior scan. No pleural effusion. Upper abdomen: Atheromatous aorta.  No acute abnormality. Musculoskeletal: Bridging osteophytes across multiple contiguous levels in the mid thoracic spine. Spondylitic changes in the lower  thoracic spine. Degenerative changes in bilateral shoulders. Review of the MIP images confirms the above findings. IMPRESSION: 1. Negative for acute PE or thoracic aortic dissection. 2. Extensive airspace disease most marked in the right lower lobe, suspect infectious/inflammatory process. Consider follow-up to confirm appropriate resolution . 3. Four-chamber cardiac enlargement with dilated central pulmonary arteries suggesting pulmonary hypertension. 4. Atherosclerosis, including aortic and coronary artery disease. Please note that although the presence of coronary artery calcium documents the presence of coronary artery disease, the severity of this disease and any potential stenosis cannot be assessed on this non-gated CT examination. Assessment for potential risk factor modification, dietary therapy or pharmacologic therapy may be warranted, if clinically indicated. Electronically Signed   By: Lucrezia Europe M.D.   On: 08/30/2015 12:54   Dg Shoulder Left  08/30/2015  CLINICAL DATA:  nosebleed onset yesterday and spitting blood. Pt also c/o generalized weakness and decrease appetite x 3 weeks. left shoulder pain worsening x 2 weeks. Denies injury. Pt is unable to move her left arm at all without sharp pain, scapula was tender during imaging as well. Unable to raise arm for lateral imaging. Lateral chest xray attempted with arm down to capture shoulder injury as well. EXAM: LEFT SHOULDER - 2+ VIEW COMPARISON:  None. FINDINGS: There is no evidence of fracture or dislocation. There is no evidence of arthropathy or other focal bone abnormality. Spurring in the visualized mid thoracic spine. Atheromatous aortic arch. Patchy axillary artery calcifications. Soft tissues are unremarkable. IMPRESSION: Negative. Electronically Signed   By: Lucrezia Europe M.D.   On: 08/30/2015 10:58    My personal review of EKG: Fib with RVR, heart rate 114/min, QTc 485 , no Acute ST changes    Assessment & Plan  Active Problems:    Essential hypertension   ATRIAL FIBRILLATION WITH SLOW VENTRICULAR RESPONSE   Hyperlipidemia   Supratherapeutic INR   CAP (community acquired pneumonia)  Coagulopathy secondary to supratherapeutic INR due to warfarin - Patient presents with epistaxis, INR>10 secondary to warfarin, will continue to hold warfarin, received 10 mg vitamin K in ED, will give 4 units of FFP's, will check INR in a.m., no evidence of active bleeding.  Epistaxis - Secondary to coagulopathy, no recurrence since she presented to ED  Community-acquired pneumonia - CTA chest significant for extensive right lower lung disease, pneumonia pathway, continue with Rocephin and azithromycin  Atrial fibrillation - Heartrate control, continue with metoprolol and verapamil - Continue to hold warfarin in the setting of coagulopathy. - We'll resume anticoagulation when more stable  History of CVA - Continue to hold aspirin given epistaxis  Hyperlipidemia - Continue with statin  Hypertension -  Acceptable home medication  Left shoulder pain - Musculoskeletal, check x-ray  DVT Prophylaxis  SCDs  AM Labs Ordered, also please review Full Orders  Family Communication: Admission, patients condition and plan of care including tests being ordered have been discussed with the patient and daughter who indicate understanding  and agree with the plan and Code Status.  Code Status full  Likely DC to : Pending PT evaluation  Condition GUARDED    Time spent in minutes : 60 minutes    Lurlean Kernen M.D on 08/30/2015 at 6:28 PM  Between 7am to 7pm - Pager - [phone]  After 7pm go to [email or web address] - password [email or web address]  And look for the night coverage person covering me after hours  [facility] Office  [phone]

## 2015-08-30 NOTE — ED Notes (Signed)
Ordered pt's dinner 

## 2015-08-30 NOTE — ED Notes (Signed)
Received pt from home with c/o nosebleed onset yesterday and spitting blood. Pt also c/o generalized weakness and decrease appetite x 3 weeks.

## 2015-08-30 NOTE — Progress Notes (Signed)
Pharmacy consulted to dose ceftriaxone for CAP. Will start ceftriaxone 1 gram IV q 24 hours. Will s/o; please re consult if we can be of further assistance.   Thanks,    Vincenza Hews, PharmD, BCPS 08/30/2015, 1:38 PM Pager: [phone]

## 2015-08-30 NOTE — ED Notes (Signed)
Patient transported to X-ray 

## 2015-08-31 LAB — CBC
HEMATOCRIT: 32.1 % — AB (ref 36.0–46.0)
HEMOGLOBIN: 10.5 g/dL — AB (ref 12.0–15.0)
MCH: 28.7 pg (ref 26.0–34.0)
MCHC: 32.7 g/dL (ref 30.0–36.0)
MCV: 87.7 fL (ref 78.0–100.0)
Platelets: 141 10*3/uL — ABNORMAL LOW (ref 150–400)
RBC: 3.66 MIL/uL — AB (ref 3.87–5.11)
RDW: 14.4 % (ref 11.5–15.5)
WBC: 9 10*3/uL (ref 4.0–10.5)

## 2015-08-31 LAB — PROTIME-INR
INR: 1.46 (ref 0.00–1.49)
INR: 1.43 (ref 0.00–1.49)
INR: 1.49 (ref 0.00–1.49)
PROTHROMBIN TIME: 17.8 s — AB (ref 11.6–15.2)
PROTHROMBIN TIME: 17.5 s — AB (ref 11.6–15.2)
PROTHROMBIN TIME: 18.1 s — AB (ref 11.6–15.2)

## 2015-08-31 LAB — BASIC METABOLIC PANEL
ANION GAP: 10 (ref 5–15)
BUN: 17 mg/dL (ref 6–20)
CO2: 25 mmol/L (ref 22–32)
Calcium: 8.9 mg/dL (ref 8.9–10.3)
Chloride: 109 mmol/L (ref 101–111)
Creatinine, Ser: 1.04 mg/dL — ABNORMAL HIGH (ref 0.44–1.00)
GFR, EST AFRICAN AMERICAN: 55 mL/min — AB (ref >60)
GFR, EST NON AFRICAN AMERICAN: 47 mL/min — AB (ref >60)
GLUCOSE: 137 mg/dL — AB (ref 65–99)
POTASSIUM: 3.4 mmol/L — AB (ref 3.5–5.1)
Sodium: 144 mmol/L (ref 135–145)

## 2015-08-31 LAB — HIV ANTIBODY (ROUTINE TESTING W REFLEX): HIV SCREEN 4TH GENERATION: NONREACTIVE

## 2015-08-31 LAB — FIBRINOGEN: Fibrinogen: 698 mg/dL — ABNORMAL HIGH (ref 204–475)

## 2015-08-31 MED ORDER — SENNOSIDES-DOCUSATE SODIUM 8.6-50 MG PO TABS
2.0000 | ORAL_TABLET | Freq: Two times a day (BID) | ORAL | Status: DC
Start: 2015-08-31 — End: 2015-09-01
  Administered 2015-08-31: 2 via ORAL
  Filled 2015-08-31 (×2): qty 2

## 2015-08-31 MED ORDER — WARFARIN SODIUM 5 MG PO TABS
5.0000 mg | ORAL_TABLET | Freq: Once | ORAL | Status: AC
  Administered 2015-08-31: 5 mg via ORAL
  Filled 2015-08-31: qty 1

## 2015-08-31 MED ORDER — WARFARIN - PHARMACIST DOSING INPATIENT
Freq: Every day | Status: DC
  Administered 2015-08-31 – 2015-09-01 (×2)

## 2015-08-31 NOTE — Progress Notes (Addendum)
Initial Nutrition Assessment  DOCUMENTATION CODES:   Not applicable  INTERVENTION:   Magic cup TID with meals, each supplement provides 290 kcal and 9 grams of protein  NUTRITION DIAGNOSIS:   Inadequate oral intake related to poor appetite, acute illness as evidenced by per patient/family report  GOAL:   Patient will meet greater than or equal to 90% of their needs  MONITOR:   PO intake, Supplement acceptance, Labs, Weight trends, I & O's  REASON FOR ASSESSMENT:   Malnutrition Screening Tool  ASSESSMENT:   82 y.o. Female with PMH of hyperlipidemia, hypertension, CVA, A. fib on warfarin, history of DVT/PE in the past, patient presents with complaints of epistaxis, or last 24 hours, she called her PCP, who asked her to hold her warfarin last night,, bleeding continues to happen, so she came to ED, no further epistaxis in ED, workup in ED significant for coagulopathy with INR> 10, she received 10 of IV vitamin K in ED, patient had CT head without contrast due to headache, with no evidence of bleed, she had CTA chest with no evidence of PE, marked for extensive right lower lobe disease, she endorses cough, productive, febrile at 100.7 in ED.  RD spoke with pt's daughter at bedside. Pt sleeping, hunched over in chair. Daughter reports pt's appetite is poor. Ensure and/or Boost makes the patient sick to her stomach. Was amenable to RD ordering Magic Cup dessert supplement. Patient's weight has been stable.  RD unable to complete Nutrition Focused Physical Exam at this time.  Diet Order:  Diet Heart Room service appropriate?: Yes; Fluid consistency:: Thin  Skin:  Reviewed, no issues  Last BM:  2/23  Height:   Ht Readings from Last 1 Encounters:  08/30/15 5\' 3"  (1.6 m)    Weight:   Wt Readings from Last 1 Encounters:  08/30/15 169 lb 1.5 oz (76.7 kg)    Ideal Body Weight:  52.2 kg  BMI:  Body mass index is 29.96 kg/(m^2).  Estimated Nutritional Needs:   Kcal:   1400-1600  Protein:  60-70 gm  Fluid:  >/= 1.5 L  EDUCATION NEEDS:   No education needs identified at this time  Arthur Holms, RD, LDN Pager #: [phone] After-Hours Pager #: [phone]

## 2015-08-31 NOTE — Evaluation (Signed)
Physical Therapy Evaluation Patient Details Name: [name] MRN: [place]:[id number] DOB: 05-Dec-1926 Today's Date: 08/31/2015   History of Present Illness  Patient is an 82 yo female who presented for ongoing epistaxis. During work up upon admission found to PNA in addition to supratherapeutic INR. Patient also complaining of left shoulder pain. PMHx includes CVA, CA, HTP, Afib, DVT, PE and anemia.  Clinical Impression  Patient demonstrates deficits in functional mobility as indicated below. Will need continued skilled PT to address deficits and maximize function. At this time, patient significantly limited, unable to tolerate ambulation and is requiring +2 person physical assist for basic transfers. OF NOTE: patient with saturations on room air at rest at 89%, Nsg aware.     Follow Up Recommendations SNF;Supervision for mobility/OOB    Equipment Recommendations   (TBD)    Recommendations for Other Services       Precautions / Restrictions Precautions Precautions: Fall Restrictions Weight Bearing Restrictions: No      Mobility  Bed Mobility Overal bed mobility: Needs Assistance Bed Mobility: Supine to Sit     Supine to sit: Mod assist     General bed mobility comments: moderate assist to come to EOB secondary to pain in LUE limiting patients ability to push off or pull to sitting.  Transfers Overall transfer level: Needs assistance Equipment used: Rolling walker (2 wheeled) Transfers: Sit to/from [facility] Sit to Stand: +2 physical assistance;Mod assist Stand pivot transfers: +2 physical assistance;Mod assist       General transfer comment: Patient required increased physical assist to elevate trunk to upright, could not maintain upright. Unable to bring LUE onto RW. Attempted again with 2 person assist using gait belt and chuck pad. Cued for picotal stapes to chair. Patient extremely limited with activity.  Ambulation/Gait             General Gait  Details: could not perform  Stairs            Wheelchair Mobility    Modified Rankin (Stroke Patients Only)       Balance Overall balance assessment: Needs assistance   Sitting balance-Leahy Scale: Fair     Standing balance support: Single extremity supported;During functional activity Standing balance-Leahy Scale: Poor                               Pertinent Vitals/Pain Pain Assessment: 0-10 Pain Score: 7  Pain Location: left shoulder and all over generalized pain Pain Descriptors / Indicators: Aching;Grimacing;Guarding Pain Intervention(s): Limited activity within patient's tolerance;Monitored during session;Repositioned    Home Living Family/patient expects to be discharged to:: Private residence Living Arrangements: Children;Other relatives Available Help at Discharge: Family Type of Home: House Home Access: Stairs to enter Entrance Stairs-Rails: None Entrance Stairs-Number of Steps: 5   Home Equipment: Environmental consultant - 2 wheels      Prior Function Level of Independence: Needs assistance   Gait / Transfers Assistance Needed: independent with bed mobility, and activity in the home using RW     Comments: states that daughter helps if needed, but for the most part she is able to get around without help     Hand Dominance   Dominant Hand: Right    Extremity/Trunk Assessment   Upper Extremity Assessment: LUE deficits/detail           Lower Extremity Assessment: Generalized weakness (poor coordination)         Communication   Communication: [facility]  Cognition Arousal/Alertness: Awake/alert Behavior During Therapy: Flat affect Overall Cognitive Status: No family/caregiver present to determine baseline cognitive functioning                      General Comments      Exercises        Assessment/Plan    PT Assessment Patient needs continued PT services  PT Diagnosis Difficulty walking;Abnormality of gait;Generalized  weakness;Acute pain   PT Problem List Decreased strength;Decreased range of motion;Decreased activity tolerance;Decreased balance;Decreased mobility;Decreased coordination;Cardiopulmonary status limiting activity;Pain  PT Treatment Interventions DME instruction;Gait training;Stair training;Functional mobility training;Therapeutic activities;Therapeutic exercise;Balance training;Patient/family education   PT Goals (Current goals can be found in the Care Plan section) Acute Rehab PT Goals Patient Stated Goal: to get stronger PT Goal Formulation: With patient Time For Goal Achievement: 09/14/15 Potential to Achieve Goals: Good    Frequency Min 2X/week   Barriers to discharge Inaccessible home environment must be able to perform steps    Co-evaluation               End of Session Equipment Utilized During Treatment: Gait belt;Oxygen Activity Tolerance: Patient limited by fatigue;Patient limited by pain Patient left: in chair;with call bell/phone within reach;with chair alarm set Nurse Communication: Mobility status         Time: [id number] PT Time Calculation (min) (ACUTE ONLY): 21 min   Charges:   PT Evaluation $PT Eval Moderate Complexity: 1 Procedure     PT G CodesDuncan Dull 20-Sep-2015, 10:31 AM Alben Deeds, PT DPT  [phone]

## 2015-08-31 NOTE — Progress Notes (Signed)
PROGRESS NOTE  [name] [id number] DOB: 1926-12-02 DOA: 08/30/2015 PCP: Kathlene November, MD  HPI/Recap of past 24 hours:  Sitting in chair, reported feeling better, c/o left shoulder pain, cough with minimal sputum  Assessment/Plan: Active Problems:   Essential hypertension   ATRIAL FIBRILLATION WITH SLOW VENTRICULAR RESPONSE   Hyperlipidemia   Supratherapeutic INR   CAP (community acquired pneumonia)  Coagulopathy secondary to supratherapeutic INR due to warfarin - Patient presents with epistaxis, INR>10 secondary to warfarin,  Warfarin held on 2/25, received 10 mg vitamin K in ED, 4 units of FFP's, INR  Reversed, now subtherapeutic on 2/25, restart coumadin per pharmacy  Epistaxis - Secondary to coagulopathy, no recurrence since she presented to ED  Community-acquired pneumonia - CTA chest significant for extensive right lower lung disease, pneumonia pathway, continue with Rocephin and azithromycin, sputum culture pending  Atrial fibrillation, chronic - Heartrate control, continue with metoprolol and verapamil - warfarin held on admission, restarted on 2/25  History of CVA -  aspirin held given epistaxis, restart in 1-2 days if continue to be stable   H/o DVT/PE: restart coumadin from 2/25  Hyperlipidemia - Continue with statin  Hypertension - Acceptable home medication  Left shoulder pain - Musculoskeletal, x-ray unremarkable  DVT Prophylaxis SCDs  Code Status: full   Family Communication: patient   Disposition Plan: remain in patient   Consultants:  none  Procedures:  none  Antibiotics:  Rocephin/zithro   Objective: BP 110/49 mmHg  Pulse 62  Temp(Src) 99.2 F (37.3 C) (Oral)  Resp 16  Ht 5\' 3"  (1.6 m)  Wt 76.7 kg (169 lb 1.5 oz)  BMI 29.96 kg/m2  SpO2 95%  Intake/Output Summary (Last 24 hours) at 08/31/15 1759 Last data filed at 08/31/15 1700  Gross per 24 hour  Intake   1783 ml  Output      0 ml  Net   1783 ml   Filed  Weights   08/30/15 1003 08/30/15 2022  Weight: 79.379 kg (175 lb) 76.7 kg (169 lb 1.5 oz)    Exam:   General:  NAD  Cardiovascular: IRRR  Respiratory: diminished, but no significant rales, no rhonchi, no wheezing  Abdomen: Soft/ND/NT, positive BS  Musculoskeletal: No Edema  Neuro: aaox3  Data Reviewed: Basic Metabolic Panel:  Recent Labs Lab 08/30/15 1110 08/31/15 0343  NA 142 144  K 3.1* 3.4*  CL 108 109  CO2 23 25  GLUCOSE 165* 137*  BUN 24* 17  CREATININE 1.18* 1.04*  CALCIUM 8.8* 8.9   Liver Function Tests:  Recent Labs Lab 08/30/15 1110  AST 19  ALT 9*  ALKPHOS 73  BILITOT 1.2  PROT 6.7  ALBUMIN 2.8*   No results for input(s): LIPASE, AMYLASE in the last 168 hours. No results for input(s): AMMONIA in the last 168 hours. CBC:  Recent Labs Lab 08/30/15 1110 08/31/15 0343  WBC 11.6* 9.0  NEUTROABS 9.7*  --   HGB 13.0 10.5*  HCT 39.0 32.1*  MCV 87.1 87.7  PLT 154 141*   Cardiac Enzymes:    Recent Labs Lab 08/30/15 1110  TROPONINI <0.03   BNP (last 3 results) No results for input(s): BNP in the last 8760 hours.  ProBNP (last 3 results) No results for input(s): PROBNP in the last 8760 hours.  CBG: No results for input(s): GLUCAP in the last 168 hours.  Recent Results (from the past 240 hour(s))  Blood culture (routine x 2)     Status: None (Preliminary result)  Collection Time: 08/30/15  3:11 PM  Result Value Ref Range Status   Specimen Description BLOOD RIGHT FOREARM  Final   Special Requests BOTTLES DRAWN AEROBIC AND ANAEROBIC 5CC  Final   Culture NO GROWTH 1 DAY  Final   Report Status PENDING  Incomplete  Blood culture (routine x 2)     Status: None (Preliminary result)   Collection Time: 08/30/15  3:24 PM  Result Value Ref Range Status   Specimen Description BLOOD RIGHT HAND  Final   Special Requests IN PEDIATRIC BOTTLE 3CC  Final   Culture NO GROWTH 1 DAY  Final   Report Status PENDING  Incomplete     Studies: No  results found.  Scheduled Meds: . atorvastatin  20 mg Oral q1800  . azithromycin  500 mg Intravenous Q24H  . calcium-vitamin D  2 tablet Oral Q breakfast  . cefTRIAXone (ROCEPHIN)  IV  1 g Intravenous Q24H  . cefTRIAXone (ROCEPHIN)  IV  1 g Intravenous Q24H  . fentaNYL  50 mcg Transdermal Q72H  . furosemide  40 mg Oral Daily  . metoprolol tartrate  25 mg Oral BID  . mometasone-formoterol  2 puff Inhalation BID  . senna-docusate  2 tablet Oral BID  . verapamil  120 mg Oral BID  . Warfarin - Pharmacist Dosing Inpatient   Does not apply q1800    Continuous Infusions:    Time spent: 78mins  Misao Fackrell MD, PhD  [facility] Pager [phone]. If 7PM-7AM, please contact night-coverage at [email or web address], password [facility] 08/31/2015, 5:59 PM  LOS: 1 day

## 2015-08-31 NOTE — Progress Notes (Signed)
ANTICOAGULATION CONSULT NOTE - Initial Consult  Pharmacy Consult for warfarin Indication: atrial fibrillation  No Known Allergies  Patient Measurements: Height: 5\' 3"  (160 cm) Weight: 169 lb 1.5 oz (76.7 kg) IBW/kg (Calculated) : 52.4  Vital Signs: Temp: 98.4 F (36.9 C) (02/25 0617) Temp Source: Oral (02/25 0617) BP: 142/61 mmHg (02/25 0617) Pulse Rate: 58 (02/25 0617)  Labs:  Recent Labs  08/30/15 1110 08/31/15 0343 08/31/15 0930  HGB 13.0 10.5*  --   HCT 39.0 32.1*  --   PLT 154 141*  --   LABPROT >90.0* 17.8* 17.5*  INR >10.00* 1.46 1.43  CREATININE 1.18* 1.04*  --   TROPONINI <0.03  --   --     Estimated Creatinine Clearance: 38.1 mL/min (by C-G formula based on Cr of 1.04).   Medical History: Past Medical History  Diagnosis Date  . Anemia   . DVT (deep venous thrombosis) ([place]) 11/06  . Pulmonary embolism ([place]) 11/06  . Colon cancer ([place]) 12/11  . Hypertension   . Atrial fibrillation ([place])   . Frequent headaches   . Heart murmur   . Stroke ([place]) 11/30/2014  . Colon cancer ([place]) 2011    Assessment: 68 yof with coagulopathy secondary to supratherapeutic INR on warfarin pta, INR>10 on admit. S/p vit k, 4u FFP. Warfarin to restart 2/25 for afib, also with dvt/pe hx. Hg 10.5 (down), plt 141 (down). INR now down to 1.43. No further bleed documented. Patient currently on azithromycin which could affect the INR at first.  PTA warfarin dose: 2.5mg  daily except 5mg  on Wed (last dose pta: "past week")  Goal of Therapy:  INR 2-3 Monitor platelets by anticoagulation protocol: Yes   Plan:  Warfarin 5mg  x1 dose tonight Daily INR, mon s/sx bleeding  Elicia Lamp, PharmD, BCPS Clinical Pharmacist Pager [phone] 08/31/2015 12:15 PM

## 2015-09-01 DIAGNOSIS — M79602 Pain in left arm: Secondary | ICD-10-CM

## 2015-09-01 LAB — BASIC METABOLIC PANEL
Anion gap: 11 (ref 5–15)
BUN: 22 mg/dL — ABNORMAL HIGH (ref 6–20)
CALCIUM: 8.9 mg/dL (ref 8.9–10.3)
CO2: 27 mmol/L (ref 22–32)
CREATININE: 1.28 mg/dL — AB (ref 0.44–1.00)
Chloride: 105 mmol/L (ref 101–111)
GFR calc non Af Amer: 37 mL/min — ABNORMAL LOW (ref >60)
GFR, EST AFRICAN AMERICAN: 43 mL/min — AB (ref >60)
GLUCOSE: 114 mg/dL — AB (ref 65–99)
Potassium: 3.7 mmol/L (ref 3.5–5.1)
Sodium: 143 mmol/L (ref 135–145)

## 2015-09-01 LAB — CBC
HEMATOCRIT: 33.2 % — AB (ref 36.0–46.0)
Hemoglobin: 10.1 g/dL — ABNORMAL LOW (ref 12.0–15.0)
MCH: 26.9 pg (ref 26.0–34.0)
MCHC: 30.4 g/dL (ref 30.0–36.0)
MCV: 88.5 fL (ref 78.0–100.0)
Platelets: 152 10*3/uL (ref 150–400)
RBC: 3.75 MIL/uL — ABNORMAL LOW (ref 3.87–5.11)
RDW: 14.5 % (ref 11.5–15.5)
WBC: 8.6 10*3/uL (ref 4.0–10.5)

## 2015-09-01 LAB — PREPARE FRESH FROZEN PLASMA
UNIT DIVISION: 0
UNIT DIVISION: 0
Unit division: 0
Unit division: 0

## 2015-09-01 LAB — MAGNESIUM: Magnesium: 1.9 mg/dL (ref 1.7–2.4)

## 2015-09-01 LAB — PROTIME-INR
INR: 1.42 (ref 0.00–1.49)
Prothrombin Time: 17.4 seconds — ABNORMAL HIGH (ref 11.6–15.2)

## 2015-09-01 MED ORDER — SENNOSIDES-DOCUSATE SODIUM 8.6-50 MG PO TABS
2.0000 | ORAL_TABLET | Freq: Every day | ORAL | Status: DC
  Administered 2015-09-02: 2 via ORAL
  Filled 2015-09-01: qty 2

## 2015-09-01 MED ORDER — FUROSEMIDE 40 MG PO TABS
40.0000 mg | ORAL_TABLET | ORAL | Status: DC
  Administered 2015-09-02: 40 mg via ORAL
  Filled 2015-09-01: qty 1

## 2015-09-01 MED ORDER — ALBUTEROL SULFATE (2.5 MG/3ML) 0.083% IN NEBU: 2.5000 mg | INHALATION_SOLUTION | RESPIRATORY_TRACT | Status: DC | PRN

## 2015-09-01 MED ORDER — WARFARIN SODIUM 5 MG PO TABS
5.0000 mg | ORAL_TABLET | Freq: Once | ORAL | Status: AC
  Administered 2015-09-01: 5 mg via ORAL
  Filled 2015-09-01: qty 1

## 2015-09-01 NOTE — Progress Notes (Signed)
PROGRESS NOTE  [name] [id number] DOB: 10/09/1926 DOA: 08/30/2015 PCP: Megan November, MD  HPI/Recap of past 24 hours:  Reported feeling better,  c/o left shoulder pain, cough with minimal sputum, reported no bm since in the hospital, daughter in room  Assessment/Plan: Active Problems:   Essential hypertension   ATRIAL FIBRILLATION WITH SLOW VENTRICULAR RESPONSE   Hyperlipidemia   Supratherapeutic INR   CAP (community acquired pneumonia)  Coagulopathy secondary to supratherapeutic INR due to warfarin - Patient presents with epistaxis, INR>10 secondary to warfarin,  Warfarin held on 2/25, received 10 mg vitamin K in ED, 4 units of FFP's, INR  Reversed, now subtherapeutic on 2/25, restart coumadin per pharmacy  Epistaxis - Secondary to coagulopathy, no recurrence since she presented to ED  Community-acquired pneumonia - CTA chest significant for extensive right lower lung disease, pneumonia pathway, continue with Rocephin and azithromycin, sputum culture pending collection, improving, wean oxygen  Atrial fibrillation, chronic - Heartrate control, continue with metoprolol and verapamil - warfarin held on admission, restarted on 2/25  History of CVA -  aspirin held given epistaxis, restart in 1-2 days if continue to be stable  CKD II/III: cr slightly worsening, decrease lasix frequency from daily to every MWF. Close monitor cr, renal dosing meds.   H/o DVT/PE: restart coumadin from 2/25  Hyperlipidemia - Continue with statin  Hypertension - Acceptable home medication  Left shoulder pain - Musculoskeletal, x-ray unremarkable, on physical exam, shoulder is unremarkable, but dose has left arm edema and a small area of erythema and very tender to touch on the posterior aspect of left arm above left elbow, suspect mild cellulitis, she is already on abx, will get venous US to r/o DVT, she is already on anticoagulation.   Constipation: start stool softener.  DVT  Prophylaxis SCDs, restart coumadin  Code Status: full   Family Communication: patient and daughter in room  Disposition Plan: remain in patient, possible d/c early next week, not sure home health vs rehab, pt/ot pending   Consultants:  none  Procedures:  none  Antibiotics:  Rocephin/zithro   Objective: BP 108/78 mmHg  Pulse 84  Temp(Src) 98.3 F (36.8 C) (Oral)  Resp 16  Ht 5\' 3"  (1.6 m)  Wt 76.7 kg (169 lb 1.5 oz)  BMI 29.96 kg/m2  SpO2 92%  Intake/Output Summary (Last 24 hours) at 09/01/15 1212 Last data filed at 09/01/15 0900  Gross per 24 hour  Intake    280 ml  Output      0 ml  Net    280 ml   Filed Weights   08/30/15 1003 08/30/15 2022  Weight: 79.379 kg (175 lb) 76.7 kg (169 lb 1.5 oz)    Exam:   General:  NAD  Cardiovascular: IRRR  Respiratory: improved aeration, but no significant rales, no rhonchi, no wheezing  Abdomen: Soft/ND/NT, positive BS  Musculoskeletal: No Edema  Neuro: aaox3, mild impaired memory  Data Reviewed: Basic Metabolic Panel:  Recent Labs Lab 08/30/15 1110 08/31/15 0343 09/01/15 0243  NA 142 144 143  K 3.1* 3.4* 3.7  CL 108 109 105  CO2 23 25 27   GLUCOSE 165* 137* 114*  BUN 24* 17 22*  CREATININE 1.18* 1.04* 1.28*  CALCIUM 8.8* 8.9 8.9  MG  --   --  1.9   Liver Function Tests:  Recent Labs Lab 08/30/15 1110  AST 19  ALT 9*  ALKPHOS 73  BILITOT 1.2  PROT 6.7  ALBUMIN 2.8*   No results for  input(s): LIPASE, AMYLASE in the last 168 hours. No results for input(s): AMMONIA in the last 168 hours. CBC:  Recent Labs Lab 08/30/15 1110 08/31/15 0343 09/01/15 0243  WBC 11.6* 9.0 8.6  NEUTROABS 9.7*  --   --   HGB 13.0 10.5* 10.1*  HCT 39.0 32.1* 33.2*  MCV 87.1 87.7 88.5  PLT 154 141* 152   Cardiac Enzymes:    Recent Labs Lab 08/30/15 1110  TROPONINI <0.03   BNP (last 3 results) No results for input(s): BNP in the last 8760 hours.  ProBNP (last 3 results) No results for input(s):  PROBNP in the last 8760 hours.  CBG: No results for input(s): GLUCAP in the last 168 hours.  Recent Results (from the past 240 hour(s))  Blood culture (routine x 2)     Status: None (Preliminary result)   Collection Time: 08/30/15  3:11 PM  Result Value Ref Range Status   Specimen Description BLOOD RIGHT FOREARM  Final   Special Requests BOTTLES DRAWN AEROBIC AND ANAEROBIC 5CC  Final   Culture NO GROWTH 1 DAY  Final   Report Status PENDING  Incomplete  Blood culture (routine x 2)     Status: None (Preliminary result)   Collection Time: 08/30/15  3:24 PM  Result Value Ref Range Status   Specimen Description BLOOD RIGHT HAND  Final   Special Requests IN PEDIATRIC BOTTLE 3CC  Final   Culture NO GROWTH 1 DAY  Final   Report Status PENDING  Incomplete     Studies: No results found.  Scheduled Meds: . atorvastatin  20 mg Oral q1800  . azithromycin  500 mg Intravenous Q24H  . calcium-vitamin D  2 tablet Oral Q breakfast  . cefTRIAXone (ROCEPHIN)  IV  1 g Intravenous Q24H  . fentaNYL  50 mcg Transdermal Q72H  . [START ON 09/02/2015] furosemide  40 mg Oral Q M,W,F  . metoprolol tartrate  25 mg Oral BID  . mometasone-formoterol  2 puff Inhalation BID  . senna-docusate  2 tablet Oral BID  . verapamil  120 mg Oral BID  . warfarin  5 mg Oral ONCE-1800  . Warfarin - Pharmacist Dosing Inpatient   Does not apply q1800    Continuous Infusions:    Time spent: 78mins  Megan Livengood MD, PhD  [facility] Pager [phone]. If 7PM-7AM, please contact night-coverage at [email or web address], password [facility] 09/01/2015, 12:12 PM  LOS: 2 days

## 2015-09-01 NOTE — Progress Notes (Addendum)
ANTICOAGULATION CONSULT NOTE  Pharmacy Consult for warfarin Indication: atrial fibrillation  No Known Allergies  Patient Measurements: Height: 5\' 3"  (160 cm) Weight: 169 lb 1.5 oz (76.7 kg) IBW/kg (Calculated) : 52.4  Vital Signs: Temp: 98.3 F (36.8 C) (02/26 0512) Temp Source: Oral (02/26 0512) BP: 108/78 mmHg (02/26 0935) Pulse Rate: 84 (02/26 0935)  Labs:  Recent Labs  08/30/15 1110 08/31/15 0343 08/31/15 0930 08/31/15 1529 09/01/15 0243  HGB 13.0 10.5*  --   --  10.1*  HCT 39.0 32.1*  --   --  33.2*  PLT 154 141*  --   --  152  LABPROT >90.0* 17.8* 17.5* 18.1* 17.4*  INR >10.00* 1.46 1.43 1.49 1.42  CREATININE 1.18* 1.04*  --   --  1.28*  TROPONINI <0.03  --   --   --   --     Estimated Creatinine Clearance: 30.9 mL/min (by C-G formula based on Cr of 1.28).   Medical History: Past Medical History  Diagnosis Date  . Anemia   . DVT (deep venous thrombosis) ([place]) 11/06  . Pulmonary embolism ([place]) 11/06  . Colon cancer ([place]) 12/11  . Hypertension   . Atrial fibrillation ([place])   . Frequent headaches   . Heart murmur   . Stroke ([place]) 11/30/2014  . Colon cancer ([place]) 2011    Assessment: 84 yof with coagulopathy secondary to supratherapeutic INR on warfarin pta, INR>10 on admit. S/p vit k, 4u FFP. Warfarin to restart 2/25 for afib, also with dvt/pe hx. Hg low stable, plt up to wnl. INR now 1.42 stable. No further bleed documented. Patient currently on azithromycin which could affect the INR.  PTA warfarin dose: 2.5mg  daily except 5mg  on Wed (last dose pta: "past week")  Goal of Therapy:  INR 2-3 Monitor platelets by anticoagulation protocol: Yes   Plan:  Warfarin 5mg  x1 dose tonight Daily INR, mon s/sx bleeding  Elicia Lamp, PharmD, [facility] Clinical Pharmacist Pager [phone] 09/01/2015 11:59 AM

## 2015-09-02 ENCOUNTER — Inpatient Hospital Stay (HOSPITAL_COMMUNITY): Payer: Medicare Other

## 2015-09-02 ENCOUNTER — Telehealth: Payer: Self-pay | Admitting: Internal Medicine

## 2015-09-02 DIAGNOSIS — R609 Edema, unspecified: Secondary | ICD-10-CM

## 2015-09-02 LAB — PROTIME-INR
INR: 2.31 — AB (ref 0.00–1.49)
INR: 2.7 — ABNORMAL HIGH (ref 0.00–1.49)
PROTHROMBIN TIME: 25.1 s — AB (ref 11.6–15.2)
PROTHROMBIN TIME: 28.3 s — AB (ref 11.6–15.2)

## 2015-09-02 LAB — BASIC METABOLIC PANEL
ANION GAP: 9 (ref 5–15)
BUN: 27 mg/dL — AB (ref 6–20)
CO2: 26 mmol/L (ref 22–32)
Calcium: 8.7 mg/dL — ABNORMAL LOW (ref 8.9–10.3)
Chloride: 101 mmol/L (ref 101–111)
Creatinine, Ser: 1.2 mg/dL — ABNORMAL HIGH (ref 0.44–1.00)
GFR calc Af Amer: 46 mL/min — ABNORMAL LOW (ref >60)
GFR, EST NON AFRICAN AMERICAN: 40 mL/min — AB (ref >60)
Glucose, Bld: 110 mg/dL — ABNORMAL HIGH (ref 65–99)
POTASSIUM: 3.5 mmol/L (ref 3.5–5.1)
SODIUM: 136 mmol/L (ref 135–145)

## 2015-09-02 LAB — CBC
HCT: 31 % — ABNORMAL LOW (ref 36.0–46.0)
Hemoglobin: 9.7 g/dL — ABNORMAL LOW (ref 12.0–15.0)
MCH: 27.4 pg (ref 26.0–34.0)
MCHC: 31.3 g/dL (ref 30.0–36.0)
MCV: 87.6 fL (ref 78.0–100.0)
PLATELETS: 156 10*3/uL (ref 150–400)
RBC: 3.54 MIL/uL — AB (ref 3.87–5.11)
RDW: 14.4 % (ref 11.5–15.5)
WBC: 7.2 10*3/uL (ref 4.0–10.5)

## 2015-09-02 NOTE — Clinical Social Work Note (Signed)
Clinical Social Work Assessment  Patient Details  Name: [name] MRN: [id number] Date of Birth: 1926/08/07  Date of referral:  09/02/15               Reason for consult:  Facility Placement                Permission sought to share information with:  Facility Sport and exercise psychologist, Family Supports Permission granted to share information::  Yes, Verbal Permission Granted  Name::     [name]  Agency::  [facility]  Relationship::  Daughter  Contact Information:  [phone]  Housing/Transportation Living arrangements for the past 2 months:  [place] of Information:  Patient, Adult Children Patient Interpreter Needed:  None Criminal Activity/Legal Involvement Pertinent to Current Situation/Hospitalization:  No - Comment as needed Significant Relationships:  Adult Children Lives with:  Adult Children Do you feel safe going back to the place where you live?  No Need for family participation in patient care:  Yes (Comment)  Care giving concerns:  CSW received referral for possible SNF placement at time of discharge. CSW met with patient and patient's daughter at bedside regarding PT recommendation of SNF placement at time of discharge. Per patient's daughter, she is currently unable to care for patient at their home given patient's current physical needs and fall risk with stairs in the home. Patient and patient's daughter expressed understanding of PT recommendation and are agreeable to SNF placement at time of discharge. CSW to continue to follow and assist with discharge planning needs.   Social Worker assessment / plan:  CSW spoke with patient and patient's daughter concerning possibility of rehab at [facility] before returning home.  Employment status:  Retired Forensic scientist:  Medicare PT Recommendations:  [place] / Referral to community resources:  [place]  Patient/Family's Response to care:  Patient and  patient's daughter recognize need for rehab before returning home and are agreeable to a SNF in [place]. Patient reported preference for somewhere in [place], [place], or [place].  Patient/Family's Understanding of and Emotional Response to Diagnosis, Current Treatment, and Prognosis:  Patient is realistic regarding therapy needs. No questions/concerns about plan or treatment.    Emotional Assessment Appearance:  Appears stated age Attitude/Demeanor/Rapport:   (Appropriate) Affect (typically observed):  Accepting, Appropriate, Pleasant Orientation:  Oriented to Self, Oriented to Place, Oriented to  Time, Oriented to Situation Alcohol / Substance use:  Not Applicable Psych involvement (Current and /or in the community):  No (Comment)  Discharge Needs  Concerns to be addressed:  Care Coordination Readmission within the last 30 days:  No Current discharge risk:  None Barriers to Discharge:  Continued Medical Work up   [employer], [place] 09/02/2015, 1:11 PM

## 2015-09-02 NOTE — Telephone Encounter (Signed)
Spoke with [name] today, patient is slightly better, probably going to a rehabilitation facility tomorrow. We'll keep in touch.

## 2015-09-02 NOTE — Telephone Encounter (Signed)
Caller name:[name] (Daughter)  Can be reached: [phone]   Reason for call: [name] (daughter) called to inform Dr. Larose Kells that patient was admitted to hospital on Friday morning with pneumonia. States she had an INR above 10 and after they gave her Vit K  And plasma it is too low now, down to 1.4 as of yesterday. Daughter will keep dr informed

## 2015-09-02 NOTE — Progress Notes (Signed)
PROGRESS NOTE  [name] [id number] DOB: [birth year] DOA: 08/30/2015 PCP: Kathlene November, MD  HPI/Recap of past 24 hours:  Reported feeling better,  c/o left arm pain, left shoulder better, cough less, bmx1 today,  daughter in room  Assessment/Plan: Active Problems:   Essential hypertension   ATRIAL FIBRILLATION WITH SLOW VENTRICULAR RESPONSE   Hyperlipidemia   Supratherapeutic INR   CAP (community acquired pneumonia)  Coagulopathy secondary to supratherapeutic INR due to warfarin - Patient presents with epistaxis, INR>10 secondary to warfarin,  Warfarin held on 2/25, received 10 mg vitamin K in ED, 4 units of FFP's, INR  Reversed, now subtherapeutic on 2/25, restart coumadin per pharmacy  Epistaxis - Secondary to coagulopathy, no recurrence since she presented to ED  Community-acquired pneumonia - CTA chest significant for extensive right lower lung disease, pneumonia pathway, continue with Rocephin and azithromycin, sputum culture pending collection, improving, wean oxygen Incentive spirometer, ambulate on room air, check oxygen level.  Atrial fibrillation, chronic - Heartrate control, continue with metoprolol and verapamil - warfarin held on admission, restarted on 2/25  History of CVA -  aspirin held given epistaxis, restart in 1-2 days if continue to be stable  CKD II/III: cr slightly worsening, decrease lasix frequency from daily to every MWF. Close monitor cr, renal dosing meds.   H/o DVT/PE: restart coumadin from 2/25  Hyperlipidemia - Continue with statin  Hypertension - Acceptable home medication  Left shoulder pain/left arm pain, daughter reported patient seems had big bruised area PTA. Now shoulder better, persistent left arm pain, not able to lift up. Does seem to be able to bend elbow - Musculoskeletal, x-ray should unremarkable, on physical exam, shoulder is unremarkable, but dose has left arm edema and a small area of erythema and very tender to touch  on the posterior aspect of left arm above left elbow on 2/26, on 2/27 erythema resolved on the posterior side but seems moved to anterior aspect of the left arm, suspect mild cellulitis, she is already on abx,  venous US to no  DVT, no abscess,  Left elbow x ray.  Constipation: start stool softener.  DVT Prophylaxis SCDs, restart coumadin  Code Status: full   Family Communication: patient and daughter in room  Disposition Plan: SNF , likely 2/28   Consultants:  none  Procedures:  none  Antibiotics:  Rocephin/zithro   Objective: BP 99/43 mmHg  Pulse 119  Temp(Src) 98.3 F (36.8 C) (Oral)  Resp 16  Ht 5\' 3"  (1.6 m)  Wt 76.7 kg (169 lb 1.5 oz)  BMI 29.96 kg/m2  SpO2 91%  Intake/Output Summary (Last 24 hours) at 09/02/15 1351 Last data filed at 09/02/15 0854  Gross per 24 hour  Intake    300 ml  Output      0 ml  Net    300 ml   Filed Weights   08/30/15 1003 08/30/15 2022  Weight: 79.379 kg (175 lb) 76.7 kg (169 lb 1.5 oz)    Exam:   General:  NAD  Cardiovascular: IRRR, rate controlled  Respiratory: improved aeration, no significant rales, no rhonchi, no wheezing  Abdomen: Soft/ND/NT, positive BS  Musculoskeletal: No Edema  Neuro: aaox3, mild impaired memory  Data Reviewed: Basic Metabolic Panel:  Recent Labs Lab 08/30/15 1110 08/31/15 0343 09/01/15 0243 09/02/15 0331  NA 142 144 143 136  K 3.1* 3.4* 3.7 3.5  CL 108 109 105 101  CO2 23 25 27 26   GLUCOSE 165* 137* 114* 110*  BUN 24*  17 22* 27*  CREATININE 1.18* 1.04* 1.28* 1.20*  CALCIUM 8.8* 8.9 8.9 8.7*  MG  --   --  1.9  --    Liver Function Tests:  Recent Labs Lab 08/30/15 1110  AST 19  ALT 9*  ALKPHOS 73  BILITOT 1.2  PROT 6.7  ALBUMIN 2.8*   No results for input(s): LIPASE, AMYLASE in the last 168 hours. No results for input(s): AMMONIA in the last 168 hours. CBC:  Recent Labs Lab 08/30/15 1110 08/31/15 0343 09/01/15 0243 09/02/15 0331  WBC 11.6* 9.0 8.6 7.2    NEUTROABS 9.7*  --   --   --   HGB 13.0 10.5* 10.1* 9.7*  HCT 39.0 32.1* 33.2* 31.0*  MCV 87.1 87.7 88.5 87.6  PLT 154 141* 152 156   Cardiac Enzymes:    Recent Labs Lab 08/30/15 1110  TROPONINI <0.03   BNP (last 3 results) No results for input(s): BNP in the last 8760 hours.  ProBNP (last 3 results) No results for input(s): PROBNP in the last 8760 hours.  CBG: No results for input(s): GLUCAP in the last 168 hours.  Recent Results (from the past 240 hour(s))  Blood culture (routine x 2)     Status: None (Preliminary result)   Collection Time: 08/30/15  3:11 PM  Result Value Ref Range Status   Specimen Description BLOOD RIGHT FOREARM  Final   Special Requests BOTTLES DRAWN AEROBIC AND ANAEROBIC 5CC  Final   Culture NO GROWTH 2 DAYS  Final   Report Status PENDING  Incomplete  Blood culture (routine x 2)     Status: None (Preliminary result)   Collection Time: 08/30/15  3:24 PM  Result Value Ref Range Status   Specimen Description BLOOD RIGHT HAND  Final   Special Requests IN PEDIATRIC BOTTLE 3CC  Final   Culture NO GROWTH 2 DAYS  Final   Report Status PENDING  Incomplete     Studies: No results found.  Scheduled Meds: . atorvastatin  20 mg Oral q1800  . azithromycin  500 mg Intravenous Q24H  . calcium-vitamin D  2 tablet Oral Q breakfast  . cefTRIAXone (ROCEPHIN)  IV  1 g Intravenous Q24H  . fentaNYL  50 mcg Transdermal Q72H  . furosemide  40 mg Oral Q M,W,F  . metoprolol tartrate  25 mg Oral BID  . mometasone-formoterol  2 puff Inhalation BID  . senna-docusate  2 tablet Oral QHS  . verapamil  120 mg Oral BID  . Warfarin - Pharmacist Dosing Inpatient   Does not apply q1800    Continuous Infusions:    Time spent: 52mins  Meri Pelot MD, PhD  [facility] Pager [phone]. If 7PM-7AM, please contact night-coverage at [email or web address], password [email or web address] 09/02/2015, 1:51 PM  LOS: 3 days

## 2015-09-02 NOTE — Care Management Important Message (Signed)
Important Message  Patient Details  Name: RHEN MARCK MRN: [id number] Date of Birth: 1926-09-23   Medicare Important Message Given:  Yes    Nuria Phebus Abena 09/02/2015, 11:04 AM

## 2015-09-02 NOTE — Progress Notes (Signed)
VASCULAR LAB PRELIMINARY  PRELIMINARY  PRELIMINARY  PRELIMINARY  Left upper extremity venous duplex completed.    Preliminary report:  Left :  No evidence of DVT or superficial thrombosis.    [name], RVS 09/02/2015, 2:11 PM

## 2015-09-02 NOTE — Progress Notes (Signed)
ANTICOAGULATION CONSULT NOTE - Follow Up Consult  Pharmacy Consult for Coumadin Indication: atrial fibrillation  No Known Allergies  Patient Measurements: Height: 5\' 3"  (160 cm) Weight: 169 lb 1.5 oz (76.7 kg) IBW/kg (Calculated) : 52.4  Vital Signs: Temp: 98.3 F (36.8 C) (02/27 0517) Temp Source: Oral (02/27 0517) BP: 99/43 mmHg (02/27 0517) Pulse Rate: 119 (02/27 0517)  Labs:  Recent Labs  08/31/15 0343  09/01/15 0243 09/02/15 0331 09/02/15 1036  HGB 10.5*  --  10.1* 9.7*  --   HCT 32.1*  --  33.2* 31.0*  --   PLT 141*  --  152 156  --   LABPROT 17.8*  < > 17.4* 25.1* 28.3*  INR 1.46  < > 1.42 2.31* 2.70*  CREATININE 1.04*  --  1.28* 1.20*  --   < > = values in this interval not displayed.  Estimated Creatinine Clearance: 33 mL/min (by C-G formula based on Cr of 1.2).  Assessment: 88yof on coumadin pta for hx afib and DVT/PE, admitted with nose bleed and INR > 10. She received 10mg  vitamin k on 2/24 and 4 units FFP. INR down to 1.46 on 2/25 and coumadin resumed. She received two doses of 5mg  and today's INR jumped from 1.42 to 2.3. I repeated the lab to verify accuracy and repeat is even higher at 2.7. No further bleeding.  Home dose: 2.5mg  daily except 5mg  on Wednesday Goal of Therapy:  INR 2-3 Monitor platelets by anticoagulation protocol: Yes   Plan:  1) No coumadin tonight 2) Daily INR  Deboraha Sprang 09/02/2015,12:25 PM

## 2015-09-02 NOTE — Evaluation (Signed)
Occupational Therapy Evaluation Patient Details Name: [name] MRN: [id number] DOB: Mar 27, 1927 Today's Date: 09/02/2015    History of Present Illness Patient is an 82 yo female who presented for ongoing epistaxis. During work up upon admission found to PNA in addition to supratherapeutic INR. Patient also complaining of left shoulder pain. PMHx includes CVA, CA, HTP, Afib, DVT, PE and anemia.   Clinical Impression   At her baseline, pt ambulates with a RW and is able to perform her own self care at a modified independent level.  Pt presents with generalized weakness, L UE pain significantly impairing functional use, and decreased balance interfering with ability to perform as prior to her illness. Pt currently needs +2 assist for OOB mobility.  Will follow acutely.  Recommending [facility] for rehab prior to return home with her supportive daughter.    Follow Up Recommendations  SNF;Supervision/Assistance - 24 hour    Equipment Recommendations       Recommendations for Other Services       Precautions / Restrictions Precautions Precautions: Fall Restrictions Weight Bearing Restrictions: No      Mobility Bed Mobility   Bed Mobility: Supine to Sit;Sit to Supine     Supine to sit: Mod assist Sit to supine: Mod assist   General bed mobility comments: pt not able to use L UE to assist to EOB  Transfers                      Balance     Sitting balance-Leahy Scale: Fair       Standing balance-Leahy Scale: Poor                              ADL Overall ADL's : Needs assistance/impaired Eating/Feeding: Set up;Sitting   Grooming: Wash/dry hands;Wash/dry face;Sitting;Minimal assistance   Upper Body Bathing: Moderate assistance;Sitting   Lower Body Bathing: Sit to/from stand;Total assistance   Upper Body Dressing : Sitting;Moderate assistance   Lower Body Dressing: Total assistance;Sit to/from stand                 General ADL  Comments: Instructed to dress L UE first, then R.     Vision     Perception     Praxis      Pertinent Vitals/Pain Pain Assessment: Faces Faces Pain Scale: Hurts even more Pain Location: L UE Pain Descriptors / Indicators: Aching;Guarding;Grimacing Pain Intervention(s): Limited activity within patient's tolerance;Monitored during session;Premedicated before session     Hand Dominance Right   Extremity/Trunk Assessment Upper Extremity Assessment Upper Extremity Assessment: LUE deficits/detail;RUE deficits/detail RUE Deficits / Details: arthritic changes in hand, generalized weakness LUE Deficits / Details: Limited use and ROM due to pain in upper arm. Arthritic changes in hand.   Lower Extremity Assessment Lower Extremity Assessment: Defer to PT evaluation   Cervical / Trunk Assessment Cervical / Trunk Assessment: Kyphotic   Communication Communication Communication: HOH   Cognition Arousal/Alertness: Awake/alert Behavior During Therapy: WFL for tasks assessed/performed Overall Cognitive Status: Within Functional Limits for tasks assessed                     General Comments       Exercises       Shoulder Instructions      Home Living Family/patient expects to be discharged to:: Private residence Living Arrangements: Children;Other relatives Available Help at Discharge: Family;Available 24 hours/day Type of Home: [place]  Access: Stairs to enter [employer] of Steps: 5 Entrance Stairs-Rails: None Home Layout: One level     Bathroom Shower/Tub: Occupational psychologist: Standard     Home Equipment: Environmental consultant - 2 wheels;Shower seat          Prior Functioning/Environment Level of Independence: Needs assistance  Gait / Transfers Assistance Needed: independent with bed mobility, and activity in the home using RW ADL's / Homemaking Assistance Needed: Until 2 weeks prior to admission, pt was able to perform self care at a  modified independent level.        OT Diagnosis: Generalized weakness;Acute pain   OT Problem List: Decreased strength;Decreased activity tolerance;Impaired balance (sitting and/or standing);Decreased knowledge of use of DME or AE;Impaired UE functional use;Pain   OT Treatment/Interventions: Self-care/ADL training;Therapeutic exercise;DME and/or AE instruction;Therapeutic activities;Patient/family education;Balance training    OT Goals(Current goals can be found in the care plan section) Acute Rehab OT Goals Patient Stated Goal: to get stronger OT Goal Formulation: With patient Time For Goal Achievement: 09/09/15 Potential to Achieve Goals: Good ADL Goals Pt Will Perform Grooming: with min assist;standing Pt Will Perform Upper Body Dressing: with supervision;sitting Pt Will Perform Lower Body Dressing: with min assist;sit to/from stand Pt Will Transfer to Toilet: with min assist;ambulating;bedside commode Pt Will Perform Toileting - Clothing Manipulation and hygiene: with min assist;sit to/from stand Pt/caregiver will Perform Home Exercise Program: Left upper extremity;With Supervision (AROM shoulder and elbow)  OT Frequency: Min 2X/week   Barriers to D/C:            Co-evaluation              End of Session Nurse Communication: Mobility status  Activity Tolerance: Patient limited by pain Patient left: in bed;with call bell/phone within reach;with family/visitor present   Time: 1332-1350 OT Time Calculation (min): 18 min Charges:  OT General Charges $OT Visit: 1 Procedure OT Evaluation $OT Eval Moderate Complexity: 1 Procedure G-Codes:    Malka So 09/02/2015, 2:07 PM  [phone]

## 2015-09-02 NOTE — Telephone Encounter (Signed)
FYI

## 2015-09-02 NOTE — NC FL2 (Signed)
[place] LEVEL OF CARE SCREENING TOOL     IDENTIFICATION  Patient Name: [name] Birthdate: 1927/04/25 Sex: female Admission Date (Current Location): 08/30/2015  [facility] and [place] Number:  [employer] and Address:  [facility] [place] [place] [address] [place], [place] [address]      Provider Number: [id number]  Attending Physician Name and Address:  Florencia Reasons, MD  Relative Name and Phone Number:  [name], daughter, [phone]    Current Level of Care: Hospital Recommended Level of Care: [place] Prior Approval Number:    Date Approved/Denied:   PASRR Number: [id number]  Discharge Plan: SNF    Current Diagnoses: Patient Active Problem List   Diagnosis Date Noted  . Supratherapeutic INR 08/30/2015  . CAP (community acquired pneumonia) 08/30/2015  . Asthma with exacerbation 08/14/2015  . PCP NOTES >>>>> 04/24/2015  . Hyperlipidemia 12/31/2014  . TIA (transient ischemic attack) 12/11/2014  . Dyslipidemia 12/11/2014  . Cerebral vascular accident ([place]) 12/01/2014  . BP (high blood pressure) 12/01/2014  . Infection of urinary tract 12/01/2014  . Change in blood platelet count 12/01/2014  . Renal insufficiency, mild 11/12/2013  . Impingement syndrome, shoulder, left 11/09/2013  . Acquired short bowel syndrome 11/09/2013  . Encounter for therapeutic drug monitoring 08/30/2013  . Hx pulmonary embolism 06/27/2013  . Warfarin anticoagulation 06/27/2013  . DDD (degenerative disc disease), lumbar 11/18/2012  . Mitral regurgitation 08/26/2011  . DYSPHAGIA UNSPECIFIED 07/31/2010  . ATRIAL FIBRILLATION WITH SLOW VENTRICULAR RESPONSE 07/08/2010  . COLON CANCER, HX OF 07/08/2010  . DYSPNEA/SHORTNESS OF BREATH 06/11/2010  . DIZZINESS 10/18/2009  . HYPOTENSION, ORTHOSTATIC 09/20/2009  . MUSCLE PAIN 08/23/2008  . Essential hypertension 12/16/2007  . EDEMA- LOCALIZED 12/16/2007  . HIP PAIN, CHRONIC 12/28/2006  .  ANEMIA-NOS 07/15/2006  . Personal history of venous thrombosis and embolism 07/15/2006    Orientation RESPIRATION BLADDER Height & Weight     Self, Time, Situation, Place  Normal Incontinent Weight: 169 lb 1.5 oz (76.7 kg) Height:  5\' 3"  (160 cm)  BEHAVIORAL SYMPTOMS/MOOD NEUROLOGICAL BOWEL NUTRITION STATUS      Continent  (Please see DC summary)  AMBULATORY STATUS COMMUNICATION OF NEEDS Skin   Extensive Assist Verbally Normal                       Personal Care Assistance Level of Assistance  Bathing, Feeding, Dressing Bathing Assistance: Maximum assistance Feeding assistance: Limited assistance Dressing Assistance: Limited assistance     Functional Limitations Info             SPECIAL CARE FACTORS FREQUENCY  PT (By licensed PT)     PT Frequency: min 2x/week              Contractures      Additional Factors Info  Code Status, Allergies Code Status Info: Full Allergies Info: NKA           Current Medications (09/02/2015):  This is the current hospital active medication list Current Facility-Administered Medications  Medication Dose Route Frequency Provider Last Rate Last Dose  . acetaminophen (TYLENOL) tablet 500 mg  500 mg Oral Q8H PRN Albertine Patricia, MD   500 mg at 08/31/15 1413  . albuterol (PROVENTIL) (2.5 MG/3ML) 0.083% nebulizer solution 2.5 mg  2.5 mg Nebulization Q4H PRN Florencia Reasons, MD      . ALPRAZolam Duanne Moron) tablet 0.25 mg  0.25 mg Oral BID PRN Albertine Patricia, MD  0.25 mg at 08/30/15 2302  . atorvastatin (LIPITOR) tablet 20 mg  20 mg Oral q1800 Albertine Patricia, MD   20 mg at 09/01/15 1744  . azithromycin (ZITHROMAX) 500 mg in dextrose 5 % 250 mL IVPB  500 mg Intravenous Q24H Albertine Patricia, MD   500 mg at 09/01/15 1639  . calcium-vitamin D (OSCAL WITH D) 500-200 MG-UNIT per tablet 2 tablet  2 tablet Oral Q breakfast Albertine Patricia, MD   2 tablet at 09/01/15 0934  . cefTRIAXone (ROCEPHIN) 1 g in dextrose 5 % 50 mL IVPB  1 g  Intravenous Q24H Albertine Patricia, MD   1 g at 09/01/15 1456  . fentaNYL (DURAGESIC - dosed mcg/hr) patch 50 mcg  50 mcg Transdermal Q72H Albertine Patricia, MD   50 mcg at 08/30/15 2035  . furosemide (LASIX) tablet 40 mg  40 mg Oral Q M,W,F Florencia Reasons, MD      . metoprolol tartrate (LOPRESSOR) tablet 25 mg  25 mg Oral BID Albertine Patricia, MD   25 mg at 09/01/15 2139  . mometasone-formoterol (DULERA) 100-5 MCG/ACT inhaler 2 puff  2 puff Inhalation BID Albertine Patricia, MD   2 puff at 09/01/15 2228  . senna-docusate (Senokot-S) tablet 2 tablet  2 tablet Oral QHS Florencia Reasons, MD      . traMADol Veatrice Bourbon) tablet 50 mg  50 mg Oral Q6H PRN Albertine Patricia, MD   50 mg at 09/02/15 0645  . verapamil (CALAN) tablet 120 mg  120 mg Oral BID Albertine Patricia, MD   120 mg at 09/01/15 2139  . Warfarin - Pharmacist Dosing Inpatient   Does not apply q1800 Romona Curls, RPH      . white petrolatum (VASELINE) gel   Topical PRN Sherwood Gambler, MD         Discharge Medications: Please see discharge summary for a list of discharge medications.  Relevant Imaging Results:  Relevant Lab Results:   Additional Information SSN: [place], [place]

## 2015-09-03 DIAGNOSIS — Z85038 Personal history of other malignant neoplasm of large intestine: Secondary | ICD-10-CM | POA: Diagnosis not present

## 2015-09-03 DIAGNOSIS — K529 Noninfective gastroenteritis and colitis, unspecified: Secondary | ICD-10-CM | POA: Diagnosis present

## 2015-09-03 DIAGNOSIS — R0602 Shortness of breath: Secondary | ICD-10-CM | POA: Diagnosis not present

## 2015-09-03 DIAGNOSIS — Z8673 Personal history of transient ischemic attack (TIA), and cerebral infarction without residual deficits: Secondary | ICD-10-CM | POA: Diagnosis not present

## 2015-09-03 DIAGNOSIS — R262 Difficulty in walking, not elsewhere classified: Secondary | ICD-10-CM | POA: Diagnosis not present

## 2015-09-03 DIAGNOSIS — M7542 Impingement syndrome of left shoulder: Secondary | ICD-10-CM | POA: Diagnosis not present

## 2015-09-03 DIAGNOSIS — R791 Abnormal coagulation profile: Secondary | ICD-10-CM | POA: Diagnosis not present

## 2015-09-03 DIAGNOSIS — I129 Hypertensive chronic kidney disease with stage 1 through stage 4 chronic kidney disease, or unspecified chronic kidney disease: Secondary | ICD-10-CM | POA: Diagnosis present

## 2015-09-03 DIAGNOSIS — I679 Cerebrovascular disease, unspecified: Secondary | ICD-10-CM | POA: Diagnosis not present

## 2015-09-03 DIAGNOSIS — R55 Syncope and collapse: Secondary | ICD-10-CM | POA: Diagnosis not present

## 2015-09-03 DIAGNOSIS — Z7901 Long term (current) use of anticoagulants: Secondary | ICD-10-CM | POA: Diagnosis not present

## 2015-09-03 DIAGNOSIS — M6281 Muscle weakness (generalized): Secondary | ICD-10-CM | POA: Diagnosis not present

## 2015-09-03 DIAGNOSIS — N183 Chronic kidney disease, stage 3 (moderate): Secondary | ICD-10-CM | POA: Diagnosis not present

## 2015-09-03 DIAGNOSIS — R197 Diarrhea, unspecified: Secondary | ICD-10-CM | POA: Diagnosis not present

## 2015-09-03 DIAGNOSIS — R1111 Vomiting without nausea: Secondary | ICD-10-CM | POA: Diagnosis not present

## 2015-09-03 DIAGNOSIS — I4891 Unspecified atrial fibrillation: Secondary | ICD-10-CM | POA: Diagnosis not present

## 2015-09-03 DIAGNOSIS — R600 Localized edema: Secondary | ICD-10-CM | POA: Diagnosis not present

## 2015-09-03 DIAGNOSIS — R609 Edema, unspecified: Secondary | ICD-10-CM | POA: Diagnosis not present

## 2015-09-03 DIAGNOSIS — I517 Cardiomegaly: Secondary | ICD-10-CM | POA: Diagnosis not present

## 2015-09-03 DIAGNOSIS — Z96642 Presence of left artificial hip joint: Secondary | ICD-10-CM | POA: Diagnosis present

## 2015-09-03 DIAGNOSIS — Z86718 Personal history of other venous thrombosis and embolism: Secondary | ICD-10-CM | POA: Diagnosis not present

## 2015-09-03 DIAGNOSIS — N179 Acute kidney failure, unspecified: Secondary | ICD-10-CM | POA: Diagnosis not present

## 2015-09-03 DIAGNOSIS — E872 Acidosis: Secondary | ICD-10-CM | POA: Diagnosis present

## 2015-09-03 DIAGNOSIS — J189 Pneumonia, unspecified organism: Secondary | ICD-10-CM | POA: Diagnosis not present

## 2015-09-03 DIAGNOSIS — N289 Disorder of kidney and ureter, unspecified: Secondary | ICD-10-CM | POA: Diagnosis not present

## 2015-09-03 DIAGNOSIS — R2681 Unsteadiness on feet: Secondary | ICD-10-CM | POA: Diagnosis not present

## 2015-09-03 DIAGNOSIS — R040 Epistaxis: Secondary | ICD-10-CM | POA: Diagnosis not present

## 2015-09-03 DIAGNOSIS — R569 Unspecified convulsions: Secondary | ICD-10-CM | POA: Diagnosis not present

## 2015-09-03 DIAGNOSIS — Z86711 Personal history of pulmonary embolism: Secondary | ICD-10-CM | POA: Diagnosis not present

## 2015-09-03 DIAGNOSIS — I10 Essential (primary) hypertension: Secondary | ICD-10-CM | POA: Diagnosis not present

## 2015-09-03 DIAGNOSIS — G9340 Encephalopathy, unspecified: Secondary | ICD-10-CM | POA: Diagnosis not present

## 2015-09-03 DIAGNOSIS — I482 Chronic atrial fibrillation: Secondary | ICD-10-CM | POA: Diagnosis not present

## 2015-09-03 DIAGNOSIS — N189 Chronic kidney disease, unspecified: Secondary | ICD-10-CM | POA: Diagnosis not present

## 2015-09-03 DIAGNOSIS — E875 Hyperkalemia: Secondary | ICD-10-CM | POA: Diagnosis not present

## 2015-09-03 DIAGNOSIS — E785 Hyperlipidemia, unspecified: Secondary | ICD-10-CM | POA: Diagnosis not present

## 2015-09-03 DIAGNOSIS — R4182 Altered mental status, unspecified: Secondary | ICD-10-CM | POA: Diagnosis not present

## 2015-09-03 DIAGNOSIS — M79602 Pain in left arm: Secondary | ICD-10-CM | POA: Diagnosis not present

## 2015-09-03 DIAGNOSIS — Z7982 Long term (current) use of aspirin: Secondary | ICD-10-CM | POA: Diagnosis not present

## 2015-09-03 DIAGNOSIS — R799 Abnormal finding of blood chemistry, unspecified: Secondary | ICD-10-CM | POA: Diagnosis not present

## 2015-09-03 DIAGNOSIS — K912 Postsurgical malabsorption, not elsewhere classified: Secondary | ICD-10-CM | POA: Diagnosis present

## 2015-09-03 DIAGNOSIS — M25512 Pain in left shoulder: Secondary | ICD-10-CM | POA: Diagnosis not present

## 2015-09-03 LAB — PROTIME-INR
INR: 3.41 — ABNORMAL HIGH (ref 0.00–1.49)
Prothrombin Time: 33.7 seconds — ABNORMAL HIGH (ref 11.6–15.2)

## 2015-09-03 LAB — CBC
HEMATOCRIT: 30 % — AB (ref 36.0–46.0)
HEMOGLOBIN: 9.8 g/dL — AB (ref 12.0–15.0)
MCH: 28.7 pg (ref 26.0–34.0)
MCHC: 32.7 g/dL (ref 30.0–36.0)
MCV: 88 fL (ref 78.0–100.0)
Platelets: 147 10*3/uL — ABNORMAL LOW (ref 150–400)
RBC: 3.41 MIL/uL — AB (ref 3.87–5.11)
RDW: 14.4 % (ref 11.5–15.5)
WBC: 6.2 10*3/uL (ref 4.0–10.5)

## 2015-09-03 LAB — BASIC METABOLIC PANEL
ANION GAP: 8 (ref 5–15)
BUN: 25 mg/dL — AB (ref 6–20)
CHLORIDE: 103 mmol/L (ref 101–111)
CO2: 26 mmol/L (ref 22–32)
Calcium: 8.5 mg/dL — ABNORMAL LOW (ref 8.9–10.3)
Creatinine, Ser: 1.14 mg/dL — ABNORMAL HIGH (ref 0.44–1.00)
GFR calc Af Amer: 49 mL/min — ABNORMAL LOW (ref >60)
GFR calc non Af Amer: 42 mL/min — ABNORMAL LOW (ref >60)
GLUCOSE: 99 mg/dL (ref 65–99)
POTASSIUM: 3.4 mmol/L — AB (ref 3.5–5.1)
Sodium: 137 mmol/L (ref 135–145)

## 2015-09-03 MED ORDER — AMOXICILLIN-POT CLAVULANATE 875-125 MG PO TABS: 1.0000 | ORAL_TABLET | Freq: Two times a day (BID) | ORAL | Status: DC

## 2015-09-03 MED ORDER — FUROSEMIDE 40 MG PO TABS: 40.0000 mg | ORAL_TABLET | ORAL | Status: DC

## 2015-09-03 MED ORDER — WARFARIN SODIUM 2.5 MG PO TABS: 2.5000 mg | ORAL_TABLET | Freq: Every day | ORAL | Status: DC

## 2015-09-03 MED ORDER — FENTANYL 50 MCG/HR TD PT72: 50.0000 ug | MEDICATED_PATCH | TRANSDERMAL | Status: DC

## 2015-09-03 MED ORDER — POTASSIUM CHLORIDE ER 10 MEQ PO TBCR: 10.0000 meq | EXTENDED_RELEASE_TABLET | Freq: Every day | ORAL | Status: DC

## 2015-09-03 MED ORDER — TRAMADOL HCL 50 MG PO TABS: 50.0000 mg | ORAL_TABLET | Freq: Four times a day (QID) | ORAL | Status: DC | PRN

## 2015-09-03 MED ORDER — POTASSIUM CHLORIDE CRYS ER 20 MEQ PO TBCR
40.0000 meq | EXTENDED_RELEASE_TABLET | Freq: Once | ORAL | Status: AC
  Administered 2015-09-03: 40 meq via ORAL
  Filled 2015-09-03: qty 2

## 2015-09-03 NOTE — Progress Notes (Signed)
ANTICOAGULATION CONSULT NOTE - Follow Up Consult  Pharmacy Consult for Coumadin Indication: atrial fibrillation  No Known Allergies  Patient Measurements: Height: 5\' 3"  (160 cm) Weight: 169 lb 1.5 oz (76.7 kg) IBW/kg (Calculated) : 52.4  Vital Signs: Temp: 98.4 F (36.9 C) (02/28 0450) Temp Source: Oral (02/28 0450) BP: 119/44 mmHg (02/28 0450) Pulse Rate: 67 (02/28 0450)  Labs:  Recent Labs  09/01/15 0243 09/02/15 0331 09/02/15 1036 09/03/15 0539  HGB 10.1* 9.7*  --  9.8*  HCT 33.2* 31.0*  --  30.0*  PLT 152 156  --  147*  LABPROT 17.4* 25.1* 28.3* 33.7*  INR 1.42 2.31* 2.70* 3.41*  CREATININE 1.28* 1.20*  --  1.14*    Estimated Creatinine Clearance: 34.7 mL/min (by C-G formula based on Cr of 1.14).  Assessment: 88yof on coumadin pta for hx afib and DVT/PE, admitted with nose bleed and INR > 10. She received 10mg  vitamin k on 2/24 and 4 units FFP. INR down to 1.46 on 2/25 and coumadin resumed. INR has increased significantly 1.42 > 2.3 > 2.7 > 3.41 after two 5mg  doses, ? due to drug interaction with azithromycin. CBC remains stable. No further bleeding.  Home dose: 2.5mg  daily except 5mg  on Wednesday  Goal of Therapy:  INR 2-3 Monitor platelets by anticoagulation protocol: Yes   Plan:  1) No coumadin tonight 2) Daily INR  **If patient is d/c'ed today, would hold coumadin tonight and tomorrow, then resume 2.5mg  daily until next INR check**  Deboraha Sprang 09/03/2015,9:23 AM

## 2015-09-03 NOTE — Progress Notes (Signed)
Physical Therapy Treatment Patient Details Name: [name] MRN: [id number] DOB: [birth year] Today's Date: 09/03/2015    History of Present Illness Patient is an 82 yo female who presented for ongoing epistaxis. During work up upon admission found to PNA in addition to supratherapeutic INR. Patient also complaining of left shoulder pain. PMHx includes CVA, CA, HTP, Afib, DVT, PE and anemia.    PT Comments    Patient progressing slowly towards PT goals. Improved ambulation today with Min A for balance/safety. Tolerated there ex sitting in chair. Pt reports continued pain and discomfort in LUE. Encouraged mobility of LUE and exercises throughout the day to improve strength and function. Will follow acutely.   Follow Up Recommendations  SNF;Supervision for mobility/OOB     Equipment Recommendations  Other (comment) (TBD)    Recommendations for Other Services       Precautions / Restrictions Precautions Precautions: Fall Restrictions Weight Bearing Restrictions: No    Mobility  Bed Mobility Overal bed mobility: Needs Assistance Bed Mobility: Supine to Sit     Supine to sit: Min guard;HOB elevated     General bed mobility comments: Limited use of LUE to get to EOB. increased time but no assist needed.  Transfers Overall transfer level: Needs assistance Equipment used: Rolling walker (2 wheeled) Transfers: Sit to/from Stand Sit to Stand: Min assist         General transfer comment: Assist to boost to standing with cues for hand placement/technique. Transferred to chair post ambulation bout.  Ambulation/Gait Ambulation/Gait assistance: Min assist Ambulation Distance (Feet): 28 Feet Assistive device: Rolling walker (2 wheeled) Gait Pattern/deviations: Step-to pattern;Step-through pattern;Decreased stride length;Trunk flexed Gait velocity: decreased Gait velocity interpretation: <1.8 ft/sec, indicative of risk for recurrent falls General Gait Details: Slow, unsteady  gait with Min A for balance/RW management. Fatigues. 2/4 DOE.    Stairs            Wheelchair Mobility    Modified Rankin (Stroke Patients Only)       Balance Overall balance assessment: Needs assistance Sitting-balance support: Feet supported;No upper extremity supported Sitting balance-Leahy Scale: Fair     Standing balance support: During functional activity;Bilateral upper extremity supported Standing balance-Leahy Scale: Poor Standing balance comment: Reliant on BUEs for support.                     Cognition Arousal/Alertness: Awake/alert Behavior During Therapy: WFL for tasks assessed/performed Overall Cognitive Status: Within Functional Limits for tasks assessed                      Exercises General Exercises - Lower Extremity Ankle Circles/Pumps: Both;10 reps;Supine Long Arc Quad: Both;15 reps;Seated    General Comments General comments (skin integrity, edema, etc.): Daughter present during session.      Pertinent Vitals/Pain Pain Assessment: Faces Faces Pain Scale: Hurts little more Pain Location: LUE Pain Descriptors / Indicators: Grimacing;Guarding Pain Intervention(s): Monitored during session;Repositioned;Premedicated before session    Home Living                      Prior Function            PT Goals (current goals can now be found in the care plan section) Progress towards PT goals: Progressing toward goals    Frequency  Min 2X/week    PT Plan Current plan remains appropriate    Co-evaluation             End of  Session Equipment Utilized During Treatment: Gait belt Activity Tolerance: Patient tolerated treatment well;Patient limited by fatigue Patient left: in chair;with call bell/phone within reach;with chair alarm set;with family/visitor present     Time: 1100-1117 PT Time Calculation (min) (ACUTE ONLY): 17 min  Charges:  $Gait Training: 8-22 mins                    G Codes:      Jereld Presti A  Aviannah Castoro 09/03/2015, 11:45 AM Wray Kearns, PT, DPT [phone]

## 2015-09-03 NOTE — Progress Notes (Signed)
Patient will DC to: [facility] Anticipated DC date: 09/03/15 Family notified: Daughter Transport by: [facility]  CSW signing off.  Cedric Fishman, [place] Social Worker [phone]

## 2015-09-03 NOTE — Discharge Summary (Addendum)
Discharge Summary  [name] [id number] DOB: [birth year]  PCP: Kathlene November, MD  Admit date: 08/30/2015 Discharge date: 09/03/2015  Time spent: >48mins  Recommendations for Outpatient Follow-up:  1. F/u with PMD at SNF, repeat cbc/bmp, monitor INR every three days till INR stable between 2-3. Repeat cxr in 4weeks to ensure resolution of pneumonia.  Discharge Diagnoses:  Active Hospital Problems   Diagnosis Date Noted  . Supratherapeutic INR 08/30/2015  . CAP (community acquired pneumonia) 08/30/2015  . Hyperlipidemia 12/31/2014  . ATRIAL FIBRILLATION WITH SLOW VENTRICULAR RESPONSE 07/08/2010  . Essential hypertension 12/16/2007    Resolved Hospital Problems   Diagnosis Date Noted Date Resolved  No resolved problems to display.    Discharge Condition: stable  Diet recommendation: heart healthy  Filed Weights   08/30/15 1003 08/30/15 2022  Weight: 79.379 kg (175 lb) 76.7 kg (169 lb 1.5 oz)    History of present illness:  [name] is a 82 y.o. female, with past medical history of hyperlipidemia, hypertension, CVA, A. fib on warfarin, history of DVT/PE in the past, patient presents with complaints of epistaxis, or last 24 hours, she called her PCP, who asked her to hold her warfarin last night,, bleeding continues to happen, so she came to ED, no further epistaxis in ED, workup in ED significant for coagulopathy with INR> 10, she received 10 of IV vitamin K in ED, patient had CT head without contrast due to headache, with no evidence of bleed, she had CTA chest with no evidence of PE, marked for extensive right lower lobe disease, she endorses cough, productive, febrile at 100.7 in ED, I was called to admit.  Hospital Course:  Active Problems:   Essential hypertension   ATRIAL FIBRILLATION WITH SLOW VENTRICULAR RESPONSE   Hyperlipidemia   Supratherapeutic INR   CAP (community acquired pneumonia)  Coagulopathy secondary to supratherapeutic INR due to warfarin -  Patient presents with epistaxis, INR>10 secondary to warfarin, Warfarin held on 2/25, received 10 mg vitamin K in ED, 4 units of FFP's, INR Reversed, now subtherapeutic on 2/25, restart coumadin per pharmacy -patient is discharged on warfarin 2.5mg  po daily , first dose from 3/2. Repeat inr every three days till INR stable between 2-3. -patient is to discharged to SNF to continue adjust coumadin dose  Epistaxis - Secondary to coagulopathy, no recurrence since she presented to ED, asa and coumadin held initially  Community-acquired pneumonia with fever 100.7 on presentation - CTA chest significant for extensive right lower lung disease, pneumonia pathway, continue with Rocephin and azithromycin, sputum culture not collected, flu swab negative, urine streppneumoniae antigen negative -fever/ cough resolved, Incentive spirometer, weaned off oxygen Discharged with oral augmentin to finish abx course, outpatient repeat cxr to ensure resolution of pneumonia.   Atrial fibrillation, chronic - Heartrate control, continue with metoprolol and verapamil - warfarin held on admission, restarted on 2/25 -patient is discharged on warfarin 2.5mg  po daily , first dose from 3/2. Repeat inr every three days till INR stable between 2-3.  History of CVA - aspirin held given epistaxis, restarted at discharge. Mild memory impairment at baseline. Walks with a walker.  CKD II/III: cr slightly worsening, decrease lasix frequency from daily to every MWF. Cr improving, renal dosing meds.    H/o DVT/PE: restart coumadin in the hospital  from 2/25,  -patient is discharged on warfarin 2.5mg  po daily , first dose from 3/2. Repeat inr every three days till INR stable between 2-3.   Hyperlipidemia - Continue with statin  Hypertension -  Acceptable home medication  Left shoulder pain/left arm pain, daughter reported patient seems had big bruised area PTA. Now shoulder better, persistent left arm pain, not able to  lift up. Does seem to be able to bend elbow - Musculoskeletal, x-ray should unremarkable, on physical exam, shoulder is unremarkable, but dose has left arm edema and a small area of erythema and very tender to touch on the posterior aspect of left arm above left elbow on 2/26, on 2/27 erythema resolved on the posterior side but seems moved to anterior aspect of the left arm, suspect mild cellulitis, she is already on abx, venous US to no DVT, no abscess,  Left elbow x ray unremarkable  Constipation: start stool softener.  DVT Prophylaxis SCDs, restart coumadin  Code Status: full   Family Communication: patient and daughter in room on 2/27  Disposition Plan: SNF  2/28   Consultants:  none  Procedures:  none  Antibiotics:  Rocephin/zithro from admission to 2/28  Discharge Exam: BP 119/44 mmHg  Pulse 67  Temp(Src) 98.4 F (36.9 C) (Oral)  Resp 18  Ht 5\' 3"  (1.6 m)  Wt 76.7 kg (169 lb 1.5 oz)  BMI 29.96 kg/m2  SpO2 91%   General: NAD  Cardiovascular: IRRR, rate controlled  Respiratory: improved aeration, no significant rales, no rhonchi, no wheezing  Abdomen: Soft/ND/NT, positive BS  Musculoskeletal: left upper arm edema /erythema subsided, less tender, able to move left arm more.   Neuro: aaox3, mild impaired memory   Discharge Instructions You were cared for by a hospitalist during your hospital stay. If you have any questions about your discharge medications or the care you received while you were in the hospital after you are discharged, you can call the unit and asked to speak with the hospitalist on call if the hospitalist that took care of you is not available. Once you are discharged, your primary care physician will handle any further medical issues. Please note that NO REFILLS for any discharge medications will be authorized once you are discharged, as it is imperative that you return to your primary care physician (or establish a relationship with a  primary care physician if you do not have one) for your aftercare needs so that they can reassess your need for medications and monitor your lab values.      Discharge Instructions    Diet - low sodium heart healthy    Complete by:  As directed      Increase activity slowly    Complete by:  As directed             Medication List    TAKE these medications        acetaminophen 500 MG tablet  Commonly known as:  TYLENOL  Take 500 mg by mouth every 8 (eight) hours as needed for mild pain.     albuterol 108 (90 Base) MCG/ACT inhaler  Commonly known as:  VENTOLIN HFA  Inhale 2 puffs into the lungs every 6 (six) hours as needed for wheezing or shortness of breath.     ALPRAZolam 0.25 MG tablet  Commonly known as:  XANAX  Take 1 tablet (0.25 mg total) by mouth 2 (two) times daily as needed for anxiety.     amoxicillin-clavulanate 875-125 MG tablet  Commonly known as:  AUGMENTIN  Take 1 tablet by mouth 2 (two) times daily.     aspirin 81 MG tablet  Take 81 mg by mouth daily.     atorvastatin 20 MG  tablet  Commonly known as:  LIPITOR  Take 1 tablet (20 mg total) by mouth daily.     CALCIUM 1200 PO  Take 1 tablet by mouth daily.     fentaNYL 50 MCG/HR  Commonly known as:  DURAGESIC - dosed mcg/hr  Place 1 patch (50 mcg total) onto the skin every 3 (three) days.     Fluticasone-Salmeterol 100-50 MCG/DOSE Aepb  Commonly known as:  ADVAIR DISKUS  Inhale 1 puff into the lungs 2 (two) times daily.     furosemide 40 MG tablet  Commonly known as:  LASIX  Take 1 tablet (40 mg total) by mouth every Monday, Wednesday, and Friday.     metoprolol tartrate 25 MG tablet  Commonly known as:  LOPRESSOR  Take 1 tablet (25 mg total) by mouth 2 (two) times daily.     MULTIPLE VITAMINS/WOMENS PO  Take by mouth daily.     potassium chloride 10 MEQ tablet  Commonly known as:  K-DUR  Take 1 tablet (10 mEq total) by mouth daily.     promethazine 25 MG tablet  Commonly known as:   PHENERGAN  TAKE ONE TABLET BY MOUTH EVERY 6 TO 8 HOURS AS NEEDED     traMADol 50 MG tablet  Commonly known as:  ULTRAM  Take 1 tablet (50 mg total) by mouth every 6 (six) hours as needed. for pain     verapamil 120 MG tablet  Commonly known as:  CALAN  Take 1 tablet (120 mg total) by mouth 2 (two) times daily.     warfarin 2.5 MG tablet  Commonly known as:  COUMADIN  Take 1 tablet (2.5 mg total) by mouth daily at 6 PM. First dose start from 3/2  Start taking on:  09/05/2015       No Known Allergies Follow-up Information    Follow up with Kathlene November, MD In 1 month.   Specialty:  Internal Medicine   Contact information:   [address] [place] [address] [phone]       Follow up with SNF MD to monitor INR every three days from 3/1 , repeat cxr two view in 4 weeks to f/u on PNA resolution. .       The results of significant diagnostics from this hospitalization (including imaging, microbiology, ancillary and laboratory) are listed below for reference.    Significant Diagnostic Studies: Dg Chest 2 View  08/30/2015  CLINICAL DATA:  Cough with epistaxis EXAM: CHEST  2 VIEW COMPARISON:  August 06, 2011 FINDINGS: There is no appreciable edema or consolidation. Heart is mildly enlarged with pulmonary vascularity within normal limits. No adenopathy. Bones are osteoporotic. There is degenerative change in thoracic spine. No apparent pneumothorax. IMPRESSION: Mild cardiac enlargement.  No edema or consolidation. Electronically Signed   By: Lowella Grip III M.D.   On: 08/30/2015 10:58   Dg Elbow 2 Views Left  09/02/2015  CLINICAL DATA:  Left shoulder pain for 1 week, worsening left elbow pain today EXAM: LEFT ELBOW - 2 VIEW COMPARISON:  None. FINDINGS: Two views of the left elbow submitted. No acute fracture or subluxation. No posterior fat pad sign. There is mild spurring of radial head. Minimal spurring of humeral epicondyles. IMPRESSION: No acute fracture or  subluxation. Mild degenerative changes. No posterior fat pad sign. Electronically Signed   By: Lahoma Crocker M.D.   On: 09/02/2015 15:22   Ct Head Wo Contrast  08/30/2015  CLINICAL DATA:  General body  weakness, loss of appetite for 2-3 weeks. Headache. Elevated INR. EXAM: CT HEAD WITHOUT CONTRAST TECHNIQUE: Contiguous axial images were obtained from the base of the skull through the vertex without intravenous contrast. COMPARISON:  08/10/2004 FINDINGS: There is atrophy and chronic small vessel disease changes. No acute intracranial abnormality. Specifically, no hemorrhage, hydrocephalus, mass lesion, acute infarction, or significant intracranial injury. No acute calvarial abnormality. Visualized paranasal sinuses and mastoids clear. Orbital soft tissues unremarkable. IMPRESSION: No acute intracranial abnormality. Atrophy, chronic microvascular disease. Electronically Signed   By: Rolm Baptise M.D.   On: 08/30/2015 14:12   Ct Angio Chest Pe W/cm &/or Wo Cm  08/30/2015  CLINICAL DATA:  Coughing up blood x day or two. Sob on exertion for a while per pt. EXAM: CT ANGIOGRAPHY CHEST WITH CONTRAST TECHNIQUE: Multidetector CT imaging of the chest was performed using the standard protocol during bolus administration of intravenous contrast. Multiplanar CT image reconstructions and MIPs were obtained to evaluate the vascular anatomy. CONTRAST:  85mL OMNIPAQUE IOHEXOL 350 MG/ML SOLN COMPARISON:  06/16/2010 FINDINGS: Vascular: Right arm IV contrast injection. The SVC is patent. Right atrial enlargement. Mild biventricular enlargement. Dilated central pulmonary arteries. Satisfactory opacification of pulmonary arteries noted, and there is no evidence of pulmonary emboli. Patent bilateral pulmonary veins. Left atrial enlargement. Scattered coronary calcifications. Adequate contrast opacification of the thoracic aorta with no evidence of dissection, aneurysm, or stenosis. There is classic 3-vessel brachiocephalic arch anatomy  without proximal stenosis. Calcified plaque in the arch and descending thoracic aorta. Mediastinum/Lymph Nodes: No pericardial effusion. No hilar or mediastinal adenopathy. Lungs/Pleura: Dense airspace consolidation in basilar segments of the right lower lobe. Scattered airspace opacities in the posterior and medial basal segments left lower lobe, superior segment right lower lobe, and posterior right middle lobe. Scattered poorly marginated nodular sub solid opacities throughout the right upper lobe and lingula. These airspace opacities were not present on prior scan. No pleural effusion. Upper abdomen: Atheromatous aorta.  No acute abnormality. Musculoskeletal: Bridging osteophytes across multiple contiguous levels in the mid thoracic spine. Spondylitic changes in the lower thoracic spine. Degenerative changes in bilateral shoulders. Review of the MIP images confirms the above findings. IMPRESSION: 1. Negative for acute PE or thoracic aortic dissection. 2. Extensive airspace disease most marked in the right lower lobe, suspect infectious/inflammatory process. Consider follow-up to confirm appropriate resolution . 3. Four-chamber cardiac enlargement with dilated central pulmonary arteries suggesting pulmonary hypertension. 4. Atherosclerosis, including aortic and coronary artery disease. Please note that although the presence of coronary artery calcium documents the presence of coronary artery disease, the severity of this disease and any potential stenosis cannot be assessed on this non-gated CT examination. Assessment for potential risk factor modification, dietary therapy or pharmacologic therapy may be warranted, if clinically indicated. Electronically Signed   By: Lucrezia Europe M.D.   On: 08/30/2015 12:54   Dg Shoulder Left  08/30/2015  CLINICAL DATA:  nosebleed onset yesterday and spitting blood. Pt also c/o generalized weakness and decrease appetite x 3 weeks. left shoulder pain worsening x 2 weeks. Denies  injury. Pt is unable to move her left arm at all without sharp pain, scapula was tender during imaging as well. Unable to raise arm for lateral imaging. Lateral chest xray attempted with arm down to capture shoulder injury as well. EXAM: LEFT SHOULDER - 2+ VIEW COMPARISON:  None. FINDINGS: There is no evidence of fracture or dislocation. There is no evidence of arthropathy or other focal bone abnormality. Spurring in the visualized mid thoracic  spine. Atheromatous aortic arch. Patchy axillary artery calcifications. Soft tissues are unremarkable. IMPRESSION: Negative. Electronically Signed   By: Lucrezia Europe M.D.   On: 08/30/2015 10:58    Microbiology: Recent Results (from the past 240 hour(s))  Blood culture (routine x 2)     Status: None (Preliminary result)   Collection Time: 08/30/15  3:11 PM  Result Value Ref Range Status   Specimen Description BLOOD RIGHT FOREARM  Final   Special Requests BOTTLES DRAWN AEROBIC AND ANAEROBIC 5CC  Final   Culture NO GROWTH 3 DAYS  Final   Report Status PENDING  Incomplete  Blood culture (routine x 2)     Status: None (Preliminary result)   Collection Time: 08/30/15  3:24 PM  Result Value Ref Range Status   Specimen Description BLOOD RIGHT HAND  Final   Special Requests IN PEDIATRIC BOTTLE 3CC  Final   Culture NO GROWTH 3 DAYS  Final   Report Status PENDING  Incomplete     Labs: Basic Metabolic Panel:  Recent Labs Lab 08/30/15 1110 08/31/15 0343 09/01/15 0243 09/02/15 0331 09/03/15 0539  NA 142 144 143 136 137  K 3.1* 3.4* 3.7 3.5 3.4*  CL 108 109 105 101 103  CO2 23 25 27 26 26   GLUCOSE 165* 137* 114* 110* 99  BUN 24* 17 22* 27* 25*  CREATININE 1.18* 1.04* 1.28* 1.20* 1.14*  CALCIUM 8.8* 8.9 8.9 8.7* 8.5*  MG  --   --  1.9  --   --    Liver Function Tests:  Recent Labs Lab 08/30/15 1110  AST 19  ALT 9*  ALKPHOS 73  BILITOT 1.2  PROT 6.7  ALBUMIN 2.8*   No results for input(s): LIPASE, AMYLASE in the last 168 hours. No results  for input(s): AMMONIA in the last 168 hours. CBC:  Recent Labs Lab 08/30/15 1110 08/31/15 0343 09/01/15 0243 09/02/15 0331 09/03/15 0539  WBC 11.6* 9.0 8.6 7.2 6.2  NEUTROABS 9.7*  --   --   --   --   HGB 13.0 10.5* 10.1* 9.7* 9.8*  HCT 39.0 32.1* 33.2* 31.0* 30.0*  MCV 87.1 87.7 88.5 87.6 88.0  PLT 154 141* 152 156 147*   Cardiac Enzymes:  Recent Labs Lab 08/30/15 1110  TROPONINI <0.03   BNP: BNP (last 3 results) No results for input(s): BNP in the last 8760 hours.  ProBNP (last 3 results) No results for input(s): PROBNP in the last 8760 hours.  CBG: No results for input(s): GLUCAP in the last 168 hours.     SignedFlorencia Reasons MD, PhD  [facility] 09/03/2015, 10:23 AM

## 2015-09-04 ENCOUNTER — Non-Acute Institutional Stay (SKILLED_NURSING_FACILITY): Payer: Medicare Other | Admitting: Internal Medicine

## 2015-09-04 ENCOUNTER — Encounter: Payer: Self-pay | Admitting: Internal Medicine

## 2015-09-04 ENCOUNTER — Ambulatory Visit: Payer: Medicare Other

## 2015-09-04 DIAGNOSIS — E785 Hyperlipidemia, unspecified: Secondary | ICD-10-CM

## 2015-09-04 DIAGNOSIS — R791 Abnormal coagulation profile: Secondary | ICD-10-CM

## 2015-09-04 DIAGNOSIS — Z8673 Personal history of transient ischemic attack (TIA), and cerebral infarction without residual deficits: Secondary | ICD-10-CM | POA: Diagnosis not present

## 2015-09-04 DIAGNOSIS — Z86718 Personal history of other venous thrombosis and embolism: Secondary | ICD-10-CM

## 2015-09-04 DIAGNOSIS — N189 Chronic kidney disease, unspecified: Secondary | ICD-10-CM | POA: Diagnosis not present

## 2015-09-04 DIAGNOSIS — I482 Chronic atrial fibrillation, unspecified: Secondary | ICD-10-CM

## 2015-09-04 DIAGNOSIS — J189 Pneumonia, unspecified organism: Secondary | ICD-10-CM

## 2015-09-04 DIAGNOSIS — R040 Epistaxis: Secondary | ICD-10-CM | POA: Diagnosis not present

## 2015-09-04 DIAGNOSIS — M7542 Impingement syndrome of left shoulder: Secondary | ICD-10-CM | POA: Diagnosis not present

## 2015-09-04 DIAGNOSIS — I10 Essential (primary) hypertension: Secondary | ICD-10-CM

## 2015-09-04 DIAGNOSIS — N182 Chronic kidney disease, stage 2 (mild): Secondary | ICD-10-CM

## 2015-09-04 LAB — CULTURE, BLOOD (ROUTINE X 2)
CULTURE: NO GROWTH
CULTURE: NO GROWTH

## 2015-09-04 NOTE — Progress Notes (Signed)
Discharge Note:  Patient alert and oriented X 3 and in no distress.  VSS. Transporting to [facility] via [place].  After visit summary provided for [facility] drivers.

## 2015-09-04 NOTE — Progress Notes (Signed)
Provider:  Inocencio Homes  MD Location:  [place] Room Number: 110 Place of Service:     PCP: Kathlene November, MD Patient Care Team: Colon Branch, MD as PCP - General (Internal Medicine)  Extended Emergency Contact Information Primary Emergency Contact: [facility] Address: [address]          [place], [place] [address] [name] of [place] Phone: [phone] Mobile Phone: [phone] Relation: Daughter  Code Status: Full Code Goals of Care: Advanced Directive information Advanced Directives 09/04/2015  Does patient have an advance directive? Yes  Type of Advance Directive Living will  Does patient want to make changes to advanced directive? No - Patient declined  Copy of advanced directive(s) in chart? No - copy requested      Chief Complaint  Patient presents with  . New Admit To SNF    HPI: Patient is a 82 y.o. female , with past medical history of hyperlipidemia, hypertension, CVA, A. fib on warfarin, history of DVT/PE in the past, patient presents with complaints of epistaxis, or last 24 hours, she called her PCP, who asked her to hold her warfarin last night,, bleeding continues to happen, so she came to ED, no further epistaxis in ED, workup in ED significant for coagulopathy with INR> 10, she received 10 of IV vitamin K in ED, patient had CT head without contrast due to headache, with no evidence of bleed, she had CTA chest with no evidence of PE, marked for extensive right lower lobe disease, she endorses cough, productive, febrile at 100.7 in ED. Pt was admitted to [facility] from 2/24[facility]28 where she was treated for nose bleed, coagulopathy and for PNA complicated by L arm cellulitis. Pt is admitted to SNF with generalized weakness. While at SNF pt will be followed for HLD, tx wit lipitor, HTN, tx with lasix, metoprolol and verapamil and h/o DVT, tx with warfarin.  Past Medical History  Diagnosis Date  . Anemia   . DVT (deep  venous thrombosis) ([place]) 11/06  . Pulmonary embolism ([place]) 11/06  . Colon cancer ([employer]) 12/11  . Hypertension   . Atrial fibrillation ([place])   . Frequent headaches   . Heart murmur   . Stroke ([place]) 11/30/2014  . Colon cancer ([place]) 2011   Past Surgical History  Procedure Laterality Date  . Appendectomy  1952  . Total hip arthroplasty  01/03/07    Left hip replacement.  . Colectomy  06/2010    partial, Dr. Donne Hazel complicated by LLE CVT; S/P IVC umbrella & anemia  . Total abdominal hysterectomy w/ bilateral salpingoophorectomy  1973    For Fibroids  . Vena cava filter placement  06/28/2010  . Hip fracture surgery  2006    Trauma    reports that she has never smoked. She does not have any smokeless tobacco history on file. She reports that she does not drink alcohol or use illicit drugs. Social History   Social History  . Marital Status: Widowed    Spouse Name: N/A  . Number of Children: 1  . Years of Education: N/A   Occupational History  . n/d    Social History Main Topics  . Smoking status: Never Smoker   . Smokeless tobacco: Not on file  . Alcohol Use: No  . Drug Use: No  . Sexual Activity: Not on file   Other Topics Concern  . Not on file   Social History Narrative   Lives w/ her daughter  Family History  Problem Relation Age of Onset  . Peripheral vascular disease Father   . Heart attack Mother 70  . Heart disease Mother     MI  . Coronary artery disease Sister   . Diabetes Paternal Grandmother   . Coronary artery disease Maternal Grandfather     Health Maintenance  Topic Date Due  . DEXA SCAN  04/23/2016 (Originally 11/26/1993)  . TETANUS/TDAP  04/23/2016 (Originally 11/27/1945)  . INFLUENZA VACCINE  02/04/2016  . PNA vac Low Risk Adult  Completed    No Known Allergies    Medication List       This list is accurate as of: 09/04/15 11:59 PM.  Always use your most recent med list.               acetaminophen 500 MG tablet  Commonly  known as:  TYLENOL  Take 500 mg by mouth every 8 (eight) hours as needed for mild pain.     albuterol 108 (90 Base) MCG/ACT inhaler  Commonly known as:  VENTOLIN HFA  Inhale 2 puffs into the lungs every 6 (six) hours as needed for wheezing or shortness of breath.     ALPRAZolam 0.25 MG tablet  Commonly known as:  XANAX  Take 1 tablet (0.25 mg total) by mouth 2 (two) times daily as needed for anxiety.     amoxicillin-clavulanate 875-125 MG tablet  Commonly known as:  AUGMENTIN  Take 1 tablet by mouth 2 (two) times daily.     aspirin 81 MG tablet  Take 81 mg by mouth daily.     atorvastatin 20 MG tablet  Commonly known as:  LIPITOR  Take 1 tablet (20 mg total) by mouth daily.     CALCIUM 1200 PO  Take 1 tablet by mouth daily.     fentaNYL 50 MCG/HR  Commonly known as:  DURAGESIC - dosed mcg/hr  Place 1 patch (50 mcg total) onto the skin every 3 (three) days.     Fluticasone-Salmeterol 100-50 MCG/DOSE Aepb  Commonly known as:  ADVAIR DISKUS  Inhale 1 puff into the lungs 2 (two) times daily.     furosemide 40 MG tablet  Commonly known as:  LASIX  Take 1 tablet (40 mg total) by mouth every Monday, Wednesday, and Friday.     metoprolol tartrate 25 MG tablet  Commonly known as:  LOPRESSOR  Take 1 tablet (25 mg total) by mouth 2 (two) times daily.     MULTIPLE VITAMINS/WOMENS PO  Take by mouth daily.     potassium chloride 10 MEQ tablet  Commonly known as:  K-DUR  Take 1 tablet (10 mEq total) by mouth daily.     promethazine 25 MG tablet  Commonly known as:  PHENERGAN  TAKE ONE TABLET BY MOUTH EVERY 6 TO 8 HOURS AS NEEDED     traMADol 50 MG tablet  Commonly known as:  ULTRAM  Take 1 tablet (50 mg total) by mouth every 6 (six) hours as needed. for pain     verapamil 120 MG tablet  Commonly known as:  CALAN  Take 1 tablet (120 mg total) by mouth 2 (two) times daily.     warfarin 2.5 MG tablet  Commonly known as:  COUMADIN  Take 1 tablet (2.5 mg total) by mouth  daily at 6 PM. First dose start from 3/2        Review of Systems   DATA OBTAINED: from patient, nurse GENERAL:  no fevers, fatigue, appetite changes  SKIN: No itching, rash or wounds EYES: No eye pain, redness, discharge EARS: No earache, tinnitus, change in hearing NOSE: No congestion, drainage or bleeding  MOUTH/THROAT: No mouth or tooth pain, No sore throat RESPIRATORY: No cough, wheezing, SOB CARDIAC: No chest pain, palpitations, lower extremity edema  GI: No abdominal pain, No N/V/D or constipation, No heartburn or reflux  GU: No dysuria, frequency or urgency, or incontinence  MUSCULOSKELETAL: No unrelieved bone/joint pain NEUROLOGIC: No headache, dizziness or focal weakness PSYCHIATRIC: No c/o anxiety or sadness    Filed Vitals:   09/04/15 1324  BP: 121/63  Pulse: 83  Temp: 97.9 F (36.6 C)  TempSrc: Oral  Resp: 20   There is no weight on file to calculate BMI. Physical Exam    GENERAL APPEARANCE: Alert, conversant,  No acute distress.  SKIN: No diaphoresis rash HEAD: Normocephalic, atraumatic  EYES: Conjunctiva/lids clear. Pupils round, reactive. EOMs intact.  EARS: External exam WNL, canals clear. Hearing grossly normal.  NOSE: No deformity or discharge.  MOUTH/THROAT: Lips w/o lesions  RESPIRATORY: Breathing is even, unlabored. Lung sounds are rales B    CARDIOVASCULAR: Heart irreg no murmurs, rubs or gallops. No peripheral edema.   GASTROINTESTINAL: Abdomen is soft, non-tender, not distended w/ normal bowel sounds. GENITOURINARY: Bladder non tender, not distended  MUSCULOSKELETAL: No abnormal joints or musculature NEUROLOGIC:  Cranial nerves 2-12 grossly intact. Moves all extremities  PSYCHIATRIC: Mood and affect appropriate to situation, no behavioral issues  Labs reviewed: Basic Metabolic Panel:  Recent Labs  09/01/15 0243 09/02/15 0331 09/03/15 0539  NA 143 136 137  K 3.7 3.5 3.4*  CL 105 101 103  CO2 27 26 26   GLUCOSE 114* 110* 99  BUN 22*  27* 25*  CREATININE 1.28* 1.20* 1.14*  CALCIUM 8.9 8.7* 8.5*  MG 1.9  --   --    Liver Function Tests:  Recent Labs  02/27/15 1415 08/30/15 1110  AST 14 19  ALT 7 9*  ALKPHOS 56 73  BILITOT 0.8 1.2  PROT 6.9 6.7  ALBUMIN 4.0 2.8*   No results for input(s): LIPASE, AMYLASE in the last 8760 hours. No results for input(s): AMMONIA in the last 8760 hours. CBC:  Recent Labs  08/30/15 1110  09/01/15 0243 09/02/15 0331 09/03/15 0539  WBC 11.6*  < > 8.6 7.2 6.2  NEUTROABS 9.7*  --   --   --   --   HGB 13.0  < > 10.1* 9.7* 9.8*  HCT 39.0  < > 33.2* 31.0* 30.0*  MCV 87.1  < > 88.5 87.6 88.0  PLT 154  < > 152 156 147*  < > = values in this interval not displayed. Cardiac Enzymes:  Recent Labs  02/27/15 1415 08/30/15 1110  CKTOTAL 84  --   TROPONINI  --  <0.03   BNP: Invalid input(s): POCBNP Lab Results  Component Value Date   HGBA1C 5.6 11/13/2013   Lab Results  Component Value Date   TSH 2.35 11/08/2013   Lab Results  Component Value Date   VITAMINB12 234 06/13/2010   Lab Results  Component Value Date   FOLATE  06/13/2010    4.9 (NOTE)  Reference Ranges        Deficient:       0.4 - 3.3 ng/mL        Indeterminate:   3.4 - 5.4 ng/mL        Normal:              >  5.4 ng/mL   Lab Results  Component Value Date   IRON 70 10/28/2010   TIBC 413 06/13/2010   FERRITIN 7* 06/13/2010    Imaging and Procedures obtained prior to SNF admission: Dg Chest 2 View  08/30/2015  CLINICAL DATA:  Cough with epistaxis EXAM: CHEST  2 VIEW COMPARISON:  August 06, 2011 FINDINGS: There is no appreciable edema or consolidation. Heart is mildly enlarged with pulmonary vascularity within normal limits. No adenopathy. Bones are osteoporotic. There is degenerative change in thoracic spine. No apparent pneumothorax. IMPRESSION: Mild cardiac enlargement.  No edema or consolidation. Electronically Signed   By: Lowella Grip III M.D.   On: 08/30/2015 10:58   Ct Head Wo  Contrast  08/30/2015  CLINICAL DATA:  General body weakness, loss of appetite for 2-3 weeks. Headache. Elevated INR. EXAM: CT HEAD WITHOUT CONTRAST TECHNIQUE: Contiguous axial images were obtained from the base of the skull through the vertex without intravenous contrast. COMPARISON:  08/10/2004 FINDINGS: There is atrophy and chronic small vessel disease changes. No acute intracranial abnormality. Specifically, no hemorrhage, hydrocephalus, mass lesion, acute infarction, or significant intracranial injury. No acute calvarial abnormality. Visualized paranasal sinuses and mastoids clear. Orbital soft tissues unremarkable. IMPRESSION: No acute intracranial abnormality. Atrophy, chronic microvascular disease. Electronically Signed   By: Rolm Baptise M.D.   On: 08/30/2015 14:12   Ct Angio Chest Pe W/cm &/or Wo Cm  08/30/2015  CLINICAL DATA:  Coughing up blood x day or two. Sob on exertion for a while per pt. EXAM: CT ANGIOGRAPHY CHEST WITH CONTRAST TECHNIQUE: Multidetector CT imaging of the chest was performed using the standard protocol during bolus administration of intravenous contrast. Multiplanar CT image reconstructions and MIPs were obtained to evaluate the vascular anatomy. CONTRAST:  43mL OMNIPAQUE IOHEXOL 350 MG/ML SOLN COMPARISON:  06/16/2010 FINDINGS: Vascular: Right arm IV contrast injection. The SVC is patent. Right atrial enlargement. Mild biventricular enlargement. Dilated central pulmonary arteries. Satisfactory opacification of pulmonary arteries noted, and there is no evidence of pulmonary emboli. Patent bilateral pulmonary veins. Left atrial enlargement. Scattered coronary calcifications. Adequate contrast opacification of the thoracic aorta with no evidence of dissection, aneurysm, or stenosis. There is classic 3-vessel brachiocephalic arch anatomy without proximal stenosis. Calcified plaque in the arch and descending thoracic aorta. Mediastinum/Lymph Nodes: No pericardial effusion. No hilar or  mediastinal adenopathy. Lungs/Pleura: Dense airspace consolidation in basilar segments of the right lower lobe. Scattered airspace opacities in the posterior and medial basal segments left lower lobe, superior segment right lower lobe, and posterior right middle lobe. Scattered poorly marginated nodular sub solid opacities throughout the right upper lobe and lingula. These airspace opacities were not present on prior scan. No pleural effusion. Upper abdomen: Atheromatous aorta.  No acute abnormality. Musculoskeletal: Bridging osteophytes across multiple contiguous levels in the mid thoracic spine. Spondylitic changes in the lower thoracic spine. Degenerative changes in bilateral shoulders. Review of the MIP images confirms the above findings. IMPRESSION: 1. Negative for acute PE or thoracic aortic dissection. 2. Extensive airspace disease most marked in the right lower lobe, suspect infectious/inflammatory process. Consider follow-up to confirm appropriate resolution . 3. Four-chamber cardiac enlargement with dilated central pulmonary arteries suggesting pulmonary hypertension. 4. Atherosclerosis, including aortic and coronary artery disease. Please note that although the presence of coronary artery calcium documents the presence of coronary artery disease, the severity of this disease and any potential stenosis cannot be assessed on this non-gated CT examination. Assessment for potential risk factor modification, dietary therapy or pharmacologic therapy may be  warranted, if clinically indicated. Electronically Signed   By: Lucrezia Europe M.D.   On: 08/30/2015 12:54   Dg Shoulder Left  08/30/2015  CLINICAL DATA:  nosebleed onset yesterday and spitting blood. Pt also c/o generalized weakness and decrease appetite x 3 weeks. left shoulder pain worsening x 2 weeks. Denies injury. Pt is unable to move her left arm at all without sharp pain, scapula was tender during imaging as well. Unable to raise arm for lateral  imaging. Lateral chest xray attempted with arm down to capture shoulder injury as well. EXAM: LEFT SHOULDER - 2+ VIEW COMPARISON:  None. FINDINGS: There is no evidence of fracture or dislocation. There is no evidence of arthropathy or other focal bone abnormality. Spurring in the visualized mid thoracic spine. Atheromatous aortic arch. Patchy axillary artery calcifications. Soft tissues are unremarkable. IMPRESSION: Negative. Electronicall@PROBAPNOTES @ y Signed   By: Lucrezia Europe M.D.   On: 08/30/2015 10:58    Assessment/Plan There are no diagnoses linked to this encounter.     Labs/tests ordered: BMP, CBC, PT/INR    Time spent > 45 min;> 50% of time with patient was spent reviewing records, labs, tests and studies, counseling and developing plan of care  [name] MD

## 2015-09-07 ENCOUNTER — Encounter: Payer: Self-pay | Admitting: Internal Medicine

## 2015-09-07 DIAGNOSIS — R040 Epistaxis: Secondary | ICD-10-CM | POA: Insufficient documentation

## 2015-09-07 NOTE — Assessment & Plan Note (Signed)
with fever 100.7 on presentation - CTA chest significant for extensive right lower lung disease, pneumonia pathway, continue with Rocephin and azithromycin, sputum culture not collected, flu swab negative, urine streppneumoniae antigen negative -fever/ cough resolved, Incentive spirometer, weaned off oxygen SNF -  oral augmentin to finish abx course of augmentin

## 2015-09-07 NOTE — Assessment & Plan Note (Signed)
SNF - Heartrate control, continue with metoprolol and verapamil; prophylxxis with coumadin, restarted on 2/25,  to be actively ritrated

## 2015-09-07 NOTE — Assessment & Plan Note (Signed)
Musculoskeletal, x-ray should unremarkable, on physical exam, shoulder is unremarkable, but dose has left arm edema and a small area of erythema and very tender to touch on the posterior aspect of left arm above left elbow on 2/26, on 2/27 erythema resolved on the posterior side but seems moved to anterior aspect of the left arm, suspect mild cellulitis, she is already on abx, venous US to no DVT, no abscess,  Left elbow x ray unremarkable

## 2015-09-07 NOTE — Assessment & Plan Note (Signed)
Secondary to coagulopathy, no recurrence since she presented to ED, asa and coumadin held initially

## 2015-09-07 NOTE — Progress Notes (Signed)
MRN: [place]:[id number] Name: [name]  Sex: female Age: 82 y.o. DOB: 1926/09/24  [place] #: [name] Facility/Room:110 Level Of Care: SNF Provider: Inocencio Homes D Emergency Contacts: Extended Emergency Contact Information Primary Emergency Contact: [name],[name] Address: [address]          [place], [place] [address] [name] of [place] Phone: [phone] Mobile Phone: [phone] Relation: Daughter  Code Status:   Allergies: Review of patient's allergies indicates no known allergies.  Chief Complaint  Patient presents with  . New Admit To SNF    HPI: Patient is 82 y.o. female whowith past medical history of hyperlipidemia, hypertension, CVA, A. fib on warfarin, history of DVT/PE in the past, patient presents with complaints of epistaxis, or last 24 hours, she called her PCP, who asked her to hold her warfarin last night,, bleeding continues to happen, so she came to ED, no further epistaxis in ED, workup in ED significant for coagulopathy with INR> 10, she received 10 of IV vitamin K in ED, patient had CT head without contrast due to headache, with no evidence of bleed, she had CTA chest with no evidence of PE, marked for extensive right lower lobe disease, she endorses cough, productive, febrile at 100.7 in ED. Pt was admitted to [facility] for 2/24-28 where she was treated for supratheraputic INR , epistaxis and PNA. Pt is admitted to SNF with generalized weakness for OT/PT. While at SNF pt will be followed for HLD, tx with lipitor, HTN, tx with metoprolol and verapamil and AF, tx with warfarin.  Past Medical History  Diagnosis Date  . Anemia   . DVT (deep venous thrombosis) ([place]) 11/06  . Pulmonary embolism ([place]) 11/06  . Colon cancer ([place]) 12/11  . Hypertension   . Atrial fibrillation ([place])   . Frequent headaches   . Heart murmur   . Stroke ([place]) 11/30/2014  . Colon cancer ([place]) 2011    Past Surgical History  Procedure Laterality Date  . Appendectomy  1952   . Total hip arthroplasty  01/03/07    Left hip replacement.  . Colectomy  06/2010    partial, Dr. Donne Hazel complicated by LLE CVT; S/P IVC umbrella & anemia  . Total abdominal hysterectomy w/ bilateral salpingoophorectomy  1973    For Fibroids  . Vena cava filter placement  06/28/2010  . Hip fracture surgery  2006    Trauma      Medication List       This list is accurate as of: 09/04/15 11:59 PM.  Always use your most recent med list.               acetaminophen 500 MG tablet  Commonly known as:  TYLENOL  Take 500 mg by mouth every 8 (eight) hours as needed for mild pain.     albuterol 108 (90 Base) MCG/ACT inhaler  Commonly known as:  VENTOLIN HFA  Inhale 2 puffs into the lungs every 6 (six) hours as needed for wheezing or shortness of breath.     ALPRAZolam 0.25 MG tablet  Commonly known as:  XANAX  Take 1 tablet (0.25 mg total) by mouth 2 (two) times daily as needed for anxiety.     amoxicillin-clavulanate 875-125 MG tablet  Commonly known as:  AUGMENTIN  Take 1 tablet by mouth 2 (two) times daily.     aspirin 81 MG tablet  Take 81 mg by mouth daily.     atorvastatin 20 MG tablet  Commonly known as:  LIPITOR  Take 1 tablet (20 mg total) by mouth daily.     CALCIUM 1200 PO  Take 1 tablet by mouth daily.     fentaNYL 50 MCG/HR  Commonly known as:  DURAGESIC - dosed mcg/hr  Place 1 patch (50 mcg total) onto the skin every 3 (three) days.     Fluticasone-Salmeterol 100-50 MCG/DOSE Aepb  Commonly known as:  ADVAIR DISKUS  Inhale 1 puff into the lungs 2 (two) times daily.     furosemide 40 MG tablet  Commonly known as:  LASIX  Take 1 tablet (40 mg total) by mouth every Monday, Wednesday, and Friday.     metoprolol tartrate 25 MG tablet  Commonly known as:  LOPRESSOR  Take 1 tablet (25 mg total) by mouth 2 (two) times daily.     MULTIPLE VITAMINS/WOMENS PO  Take by mouth daily.     potassium chloride 10 MEQ tablet  Commonly known as:  K-DUR  Take 1  tablet (10 mEq total) by mouth daily.     promethazine 25 MG tablet  Commonly known as:  PHENERGAN  TAKE ONE TABLET BY MOUTH EVERY 6 TO 8 HOURS AS NEEDED     traMADol 50 MG tablet  Commonly known as:  ULTRAM  Take 1 tablet (50 mg total) by mouth every 6 (six) hours as needed. for pain     verapamil 120 MG tablet  Commonly known as:  CALAN  Take 1 tablet (120 mg total) by mouth 2 (two) times daily.     warfarin 2.5 MG tablet  Commonly known as:  COUMADIN  Take 1 tablet (2.5 mg total) by mouth daily at 6 PM. First dose start from 3/2        No orders of the defined types were placed in this encounter.    Immunization History  Administered Date(s) Administered  . Influenza Split 03/30/2012  . Influenza Whole 04/30/2006, 04/20/2008, 04/15/2009  . Influenza, High Dose Seasonal PF 04/20/2013, 03/27/2015  . Influenza,inj,Quad PF,36+ Mos 05/23/2014  . Pneumococcal Conjugate-13 04/24/2015  . Pneumococcal Polysaccharide-23 07/06/2009    Social History  Substance Use Topics  . Smoking status: Never Smoker   . Smokeless tobacco: Not on file  . Alcohol Use: No    Family history is + MI, PVD   Review of Systems  DATA OBTAINED: from patient, nurse GENERAL:  no fevers, fatigue, appetite changes SKIN: No itching, rash or wounds EYES: No eye pain, redness, discharge EARS: No earache, tinnitus, change in hearing NOSE: No congestion, drainage or bleeding  MOUTH/THROAT: No mouth or tooth pain, No sore throat RESPIRATORY: No cough, wheezing, SOB CARDIAC: No chest pain, palpitations, lower extremity edema  GI: No abdominal pain, No N/V/D or constipation, No heartburn or reflux  GU: No dysuria, frequency or urgency, or incontinence  MUSCULOSKELETAL: No unrelieved bone/joint pain NEUROLOGIC: No headache, dizziness or focal weakness PSYCHIATRIC: No c/o anxiety or sadness   Filed Vitals:   09/04/15 1324  BP: 121/63  Pulse: 83  Temp: 97.9 F (36.6 C)  Resp: 20    SpO2 Readings  from Last 1 Encounters:  09/03/15 93%        Physical Exam  GENERAL APPEARANCE: Alert, conversant,  No acute distress.  SKIN: No diaphoresis rash HEAD: Normocephalic, atraumatic  EYES: Conjunctiva/lids clear. Pupils round, reactive. EOMs intact.  EARS: External exam WNL, canals clear. Hearing grossly normal.  NOSE: No deformity or discharge.  MOUTH/THROAT: Lips w/o lesions  RESPIRATORY: Breathing is even, unlabored. Lung sounds are clear  CARDIOVASCULAR: Heart irreg no murmurs, rubs or gallops. No peripheral edema.   GASTROINTESTINAL: Abdomen is soft, non-tender, not distended w/ normal bowel sounds. GENITOURINARY: Bladder non tender, not distended  MUSCULOSKELETAL: No abnormal joints or musculature NEUROLOGIC:  Cranial nerves 2-12 grossly intact. Moves all extremities  PSYCHIATRIC: Mood and affect appropriate to situation, no behavioral issues  Patient Active Problem List   Diagnosis Date Noted  . Epistaxis 09/07/2015  . Supratherapeutic INR 08/30/2015  . CAP (community acquired pneumonia) 08/30/2015  . Asthma with exacerbation 08/14/2015  . PCP NOTES >>>>> 04/24/2015  . Hyperlipidemia 12/31/2014  . TIA (transient ischemic attack) 12/11/2014  . Dyslipidemia 12/11/2014  . History of CVA (cerebrovascular accident) 12/01/2014  . BP (high blood pressure) 12/01/2014  . Infection of urinary tract 12/01/2014  . Change in blood platelet count 12/01/2014  . Chronic renal insufficiency, stage II (mild) 11/12/2013  . Impingement syndrome, shoulder, left 11/09/2013  . Acquired short bowel syndrome 11/09/2013  . Encounter for therapeutic drug monitoring 08/30/2013  . Hx pulmonary embolism 06/27/2013  . Warfarin anticoagulation 06/27/2013  . DDD (degenerative disc disease), lumbar 11/18/2012  . Mitral regurgitation 08/26/2011  . DYSPHAGIA UNSPECIFIED 07/31/2010  . ATRIAL FIBRILLATION WITH SLOW VENTRICULAR RESPONSE 07/08/2010  . COLON CANCER, HX OF 07/08/2010  . DYSPNEA/SHORTNESS  OF BREATH 06/11/2010  . DIZZINESS 10/18/2009  . HYPOTENSION, ORTHOSTATIC 09/20/2009  . MUSCLE PAIN 08/23/2008  . Essential hypertension 12/16/2007  . EDEMA- LOCALIZED 12/16/2007  . HIP PAIN, CHRONIC 12/28/2006  . ANEMIA-NOS 07/15/2006  . Personal history of venous thrombosis and embolism 07/15/2006    CBC    Component Value Date/Time   WBC 6.2 09/03/2015 0539   WBC 5.0 06/09/2012 1509   RBC 3.41* 09/03/2015 0539   RBC 4.71 06/09/2012 1509   HGB 9.8* 09/03/2015 0539   HGB 15.2 06/09/2012 1509   HCT 30.0* 09/03/2015 0539   HCT 44.7 06/09/2012 1509   PLT 147* 09/03/2015 0539   PLT 119* 06/09/2012 1509   MCV 88.0 09/03/2015 0539   MCV 94.9 06/09/2012 1509   LYMPHSABS 1.1 08/30/2015 1110   LYMPHSABS 1.5 06/09/2012 1509   MONOABS 0.8 08/30/2015 1110   MONOABS 0.5 06/09/2012 1509   EOSABS 0.0 08/30/2015 1110   EOSABS 0.1 06/09/2012 1509   BASOSABS 0.0 08/30/2015 1110   BASOSABS 0.0 06/09/2012 1509    CMP     Component Value Date/Time   NA 137 09/03/2015 0539   NA 142 06/09/2012 1509   K 3.4* 09/03/2015 0539   K 3.9 06/09/2012 1509   CL 103 09/03/2015 0539   CL 108* 06/09/2012 1509   CO2 26 09/03/2015 0539   CO2 26 06/09/2012 1509   GLUCOSE 99 09/03/2015 0539   GLUCOSE 103* 06/09/2012 1509   BUN 25* 09/03/2015 0539   BUN 17.0 06/09/2012 1509   CREATININE 1.14* 09/03/2015 0539   CREATININE 1.14* 11/18/2012 1700   CREATININE 1.2* 06/09/2012 1509   CALCIUM 8.5* 09/03/2015 0539   CALCIUM 9.5 06/09/2012 1509   PROT 6.7 08/30/2015 1110   PROT 7.2 06/09/2012 1509   ALBUMIN 2.8* 08/30/2015 1110   ALBUMIN 4.2 06/09/2012 1509   AST 19 08/30/2015 1110   AST 17 06/09/2012 1509   ALT 9* 08/30/2015 1110   ALT 11 06/09/2012 1509   ALKPHOS 73 08/30/2015 1110   ALKPHOS 47 06/09/2012 1509   BILITOT 1.2 08/30/2015 1110   BILITOT 0.73 06/09/2012 1509   GFRNONAA 42* 09/03/2015 0539   GFRAA 49* 09/03/2015 0539    Lab Results  Component Value Date   HGBA1C 5.6 11/13/2013      Dg Chest 2 View  08/30/2015  CLINICAL DATA:  Cough with epistaxis EXAM: CHEST  2 VIEW COMPARISON:  August 06, 2011 FINDINGS: There is no appreciable edema or consolidation. Heart is mildly enlarged with pulmonary vascularity within normal limits. No adenopathy. Bones are osteoporotic. There is degenerative change in thoracic spine. No apparent pneumothorax. IMPRESSION: Mild cardiac enlargement.  No edema or consolidation. Electronically Signed   By: Lowella Grip III M.D.   On: 08/30/2015 10:58   Ct Head Wo Contrast  08/30/2015  CLINICAL DATA:  General body weakness, loss of appetite for 2-3 weeks. Headache. Elevated INR. EXAM: CT HEAD WITHOUT CONTRAST TECHNIQUE: Contiguous axial images were obtained from the base of the skull through the vertex without intravenous contrast. COMPARISON:  08/10/2004 FINDINGS: There is atrophy and chronic small vessel disease changes. No acute intracranial abnormality. Specifically, no hemorrhage, hydrocephalus, mass lesion, acute infarction, or significant intracranial injury. No acute calvarial abnormality. Visualized paranasal sinuses and mastoids clear. Orbital soft tissues unremarkable. IMPRESSION: No acute intracranial abnormality. Atrophy, chronic microvascular disease. Electronically Signed   By: Rolm Baptise M.D.   On: 08/30/2015 14:12   Ct Angio Chest Pe W/cm &/or Wo Cm  08/30/2015  CLINICAL DATA:  Coughing up blood x day or two. Sob on exertion for a while per pt. EXAM: CT ANGIOGRAPHY CHEST WITH CONTRAST TECHNIQUE: Multidetector CT imaging of the chest was performed using the standard protocol during bolus administration of intravenous contrast. Multiplanar CT image reconstructions and MIPs were obtained to evaluate the vascular anatomy. CONTRAST:  4mL OMNIPAQUE IOHEXOL 350 MG/ML SOLN COMPARISON:  06/16/2010 FINDINGS: Vascular: Right arm IV contrast injection. The SVC is patent. Right atrial enlargement. Mild biventricular enlargement. Dilated central  pulmonary arteries. Satisfactory opacification of pulmonary arteries noted, and there is no evidence of pulmonary emboli. Patent bilateral pulmonary veins. Left atrial enlargement. Scattered coronary calcifications. Adequate contrast opacification of the thoracic aorta with no evidence of dissection, aneurysm, or stenosis. There is classic 3-vessel brachiocephalic arch anatomy without proximal stenosis. Calcified plaque in the arch and descending thoracic aorta. Mediastinum/Lymph Nodes: No pericardial effusion. No hilar or mediastinal adenopathy. Lungs/Pleura: Dense airspace consolidation in basilar segments of the right lower lobe. Scattered airspace opacities in the posterior and medial basal segments left lower lobe, superior segment right lower lobe, and posterior right middle lobe. Scattered poorly marginated nodular sub solid opacities throughout the right upper lobe and lingula. These airspace opacities were not present on prior scan. No pleural effusion. Upper abdomen: Atheromatous aorta.  No acute abnormality. Musculoskeletal: Bridging osteophytes across multiple contiguous levels in the mid thoracic spine. Spondylitic changes in the lower thoracic spine. Degenerative changes in bilateral shoulders. Review of the MIP images confirms the above findings. IMPRESSION: 1. Negative for acute PE or thoracic aortic dissection. 2. Extensive airspace disease most marked in the right lower lobe, suspect infectious/inflammatory process. Consider follow-up to confirm appropriate resolution . 3. Four-chamber cardiac enlargement with dilated central pulmonary arteries suggesting pulmonary hypertension. 4. Atherosclerosis, including aortic and coronary artery disease. Please note that although the presence of coronary artery calcium documents the presence of coronary artery disease, the severity of this disease and any potential stenosis cannot be assessed on this non-gated CT examination. Assessment for potential risk  factor modification, dietary therapy or pharmacologic therapy may be warranted, if clinically indicated. Electronically Signed   By: Lucrezia Europe M.D.   On: 08/30/2015 12:54   Dg Shoulder Left  08/30/2015  CLINICAL DATA:  nosebleed onset yesterday and spitting blood. Pt also c/o generalized weakness and decrease appetite x 3 weeks. left shoulder pain worsening x 2 weeks. Denies injury. Pt is unable to move her left arm at all without sharp pain, scapula was tender during imaging as well. Unable to raise arm for lateral imaging. Lateral chest xray attempted with arm down to capture shoulder injury as well. EXAM: LEFT SHOULDER - 2+ VIEW COMPARISON:  None. FINDINGS: There is no evidence of fracture or dislocation. There is no evidence of arthropathy or other focal bone abnormality. Spurring in the visualized mid thoracic spine. Atheromatous aortic arch. Patchy axillary artery calcifications. Soft tissues are unremarkable. IMPRESSION: Negative. Electronically Signed   By: Lucrezia Europe M.D.   On: 08/30/2015 10:58    Not all labs, radiology exams or other studies done during hospitalization come through on my EPIC note; however they are reviewed by me.    Assessment and Plan  Supratherapeutic INR Patient presents with epistaxis, INR>10 secondary to warfarin, Warfarin held on 2/25, received 10 mg vitamin K in ED, 4 units of FFP's, INR Reversed, now subtherapeutic on 2/25, restart coumadin per pharmacy -patient is discharged on warfarin 2.5mg  po daily , first dose from 3/2. Repeat inr every three days till INR stable between 2-3. SNF - actively titrate  coumadin dose   Epistaxis Secondary to coagulopathy, no recurrence since she presented to ED, asa and coumadin held initially   CAP (community acquired pneumonia) with fever 100.7 on presentation - CTA chest significant for extensive right lower lung disease, pneumonia pathway, continue with Rocephin and azithromycin, sputum culture not collected, flu  swab negative, urine streppneumoniae antigen negative -fever/ cough resolved, Incentive spirometer, weaned off oxygen SNF -  oral augmentin to finish abx course of augmentin  ATRIAL FIBRILLATION WITH SLOW VENTRICULAR RESPONSE SNF - Heartrate control, continue with metoprolol and verapamil; prophylxxis with coumadin, restarted on 2/25,  to be actively ritrated    Essential hypertension SNF - controlled on metoprolol  25 mg BID, verapamil 120 mg BID   History of CVA (cerebrovascular accident) aspirin held given epistaxis, restarted at discharge. Mild memory impairment at baseline. Walks with a walker.   Hyperlipidemia SNF - last lab was controlled; cont lipitor 20 mg daily  Personal History of Venous Thrombosis and Embolism restart coumadin in the hospital from 2/25,  SNF - cont  warfarin 2.5mg  po daily , actively titrate    Impingement syndrome, shoulder, left Musculoskeletal, x-ray should unremarkable, on physical exam, shoulder is unremarkable, but dose has left arm edema and a small area of erythema and very tender to touch on the posterior aspect of left arm above left elbow on 2/26, on 2/27 erythema resolved on the posterior side but seems moved to anterior aspect of the left arm, suspect mild cellulitis, she is already on abx, venous US to no DVT, no abscess,  Left elbow x ray unremarkable   Chronic renal insufficiency, stage II (mild) SNF - d/c cr reported to be baseline, will f/u with BMP   Time spent > 45 min;> 50% of time with patient was spent reviewing records, labs, tests and studies, counseling and developing plan of care  [name], MD

## 2015-09-07 NOTE — Assessment & Plan Note (Signed)
SNF - d/c cr reported to be baseline, will f/u with BMP

## 2015-09-07 NOTE — Assessment & Plan Note (Signed)
aspirin held given epistaxis, restarted at discharge. Mild memory impairment at baseline. Walks with a walker.

## 2015-09-07 NOTE — Assessment & Plan Note (Signed)
Patient presents with epistaxis, INR>10 secondary to warfarin, Warfarin held on 2/25, received 10 mg vitamin K in ED, 4 units of FFP's, INR Reversed, now subtherapeutic on 2/25, restart coumadin per pharmacy -patient is discharged on warfarin 2.5mg  po daily , first dose from 3/2. Repeat inr every three days till INR stable between 2-3. SNF - actively titrate  coumadin dose

## 2015-09-07 NOTE — Assessment & Plan Note (Signed)
SNF - controlled on metoprolol  25 mg BID, verapamil 120 mg BID

## 2015-09-07 NOTE — Assessment & Plan Note (Signed)
restart coumadin in the hospital from 2/25,  SNF - cont  warfarin 2.5mg  po daily , actively titrate

## 2015-09-07 NOTE — Assessment & Plan Note (Signed)
SNF - last lab was controlled; cont lipitor 20 mg daily

## 2015-09-10 ENCOUNTER — Encounter: Payer: Self-pay | Admitting: Internal Medicine

## 2015-09-10 ENCOUNTER — Non-Acute Institutional Stay (SKILLED_NURSING_FACILITY): Payer: Medicare Other | Admitting: Internal Medicine

## 2015-09-10 DIAGNOSIS — R600 Localized edema: Secondary | ICD-10-CM

## 2015-09-10 DIAGNOSIS — R791 Abnormal coagulation profile: Secondary | ICD-10-CM

## 2015-09-10 NOTE — Progress Notes (Signed)
MRN: [id number] Name: [name]  Sex: female Age: 82 y.o. DOB: 03/05/1927  [facility] #: [name] Facility/Room: 110 Level Of Care: SNF Provider: Inocencio Homes D Emergency Contacts: Extended Emergency Contact Information Primary Emergency Contact: [name],[name] Address: [address]          [place], [place] [address] [name] of [place] Phone: [phone] Mobile Phone: [phone] Relation: Daughter  Code Status:   Allergies: Review of patient's allergies indicates no known allergies.  Chief Complaint  Patient presents with  . Acute Visit    HPI: Patient is 82 y.o. female who has asked to see me for her concern for swelling in her legs for several days. Today it is worse in R leg but the worse swelling goes back and forth to either leg. Her concern, and her daughters' concern is that she is only on lasix 3 days a week instead of every day ilike she has done for years. She is not SOB, swelling is not bad. She and her daughter also wished to discuss her INR of 4+ yesterday. Pt was started back in hospital on her usual dose of 2.5 mg. She had a nosebleed yesterday that stopped spontaneously.   Past Medical History  Diagnosis Date  . Anemia   . DVT (deep venous thrombosis) ([place]) 11/06  . Pulmonary embolism ([place]) 11/06  . Colon cancer ([place]) 12/11  . Hypertension   . Atrial fibrillation ([place])   . Frequent headaches   . Heart murmur   . Stroke ([place]) 11/30/2014  . Colon cancer ([place]) 2011    Past Surgical History  Procedure Laterality Date  . Appendectomy  1952  . Total hip arthroplasty  01/03/07    Left hip replacement.  . Colectomy  06/2010    partial, Dr. Donne Hazel complicated by LLE CVT; S/P IVC umbrella & anemia  . Total abdominal hysterectomy w/ bilateral salpingoophorectomy  1973    For Fibroids  . Vena cava filter placement  06/28/2010  . Hip fracture surgery  2006    Trauma      Medication List       This list is accurate as of: 09/10/15   7:27 PM.  Always use your most recent med list.               acetaminophen 500 MG tablet  Commonly known as:  TYLENOL  Take 500 mg by mouth every 8 (eight) hours as needed for mild pain.     albuterol 108 (90 Base) MCG/ACT inhaler  Commonly known as:  VENTOLIN HFA  Inhale 2 puffs into the lungs every 6 (six) hours as needed for wheezing or shortness of breath.     ALPRAZolam 0.25 MG tablet  Commonly known as:  XANAX  Take 1 tablet (0.25 mg total) by mouth 2 (two) times daily as needed for anxiety.     amoxicillin-clavulanate 875-125 MG tablet  Commonly known as:  AUGMENTIN  Take 1 tablet by mouth 2 (two) times daily.     aspirin 81 MG tablet  Take 81 mg by mouth daily.     atorvastatin 20 MG tablet  Commonly known as:  LIPITOR  Take 1 tablet (20 mg total) by mouth daily.     CALCIUM 1200 PO  Take 1 tablet by mouth daily.     fentaNYL 50 MCG/HR  Commonly known as:  DURAGESIC - dosed mcg/hr  Place 1 patch (50 mcg total) onto the skin every 3 (three) days.     Fluticasone-Salmeterol  100-50 MCG/DOSE Aepb  Commonly known as:  ADVAIR DISKUS  Inhale 1 puff into the lungs 2 (two) times daily.     furosemide 40 MG tablet  Commonly known as:  LASIX  Take 1 tablet (40 mg total) by mouth every Monday, Wednesday, and Friday.     metoprolol tartrate 25 MG tablet  Commonly known as:  LOPRESSOR  Take 1 tablet (25 mg total) by mouth 2 (two) times daily.     MULTIPLE VITAMINS/WOMENS PO  Take by mouth daily.     potassium chloride 10 MEQ tablet  Commonly known as:  K-DUR  Take 1 tablet (10 mEq total) by mouth daily.     promethazine 25 MG tablet  Commonly known as:  PHENERGAN  TAKE ONE TABLET BY MOUTH EVERY 6 TO 8 HOURS AS NEEDED     traMADol 50 MG tablet  Commonly known as:  ULTRAM  Take 1 tablet (50 mg total) by mouth every 6 (six) hours as needed. for pain     verapamil 120 MG tablet  Commonly known as:  CALAN  Take 1 tablet (120 mg total) by mouth 2 (two) times  daily.     warfarin 2.5 MG tablet  Commonly known as:  COUMADIN  Take 1 tablet (2.5 mg total) by mouth daily at 6 PM. First dose start from 3/2        No orders of the defined types were placed in this encounter.    Immunization History  Administered Date(s) Administered  . Influenza Split 03/30/2012  . Influenza Whole 04/30/2006, 04/20/2008, 04/15/2009  . Influenza, High Dose Seasonal PF 04/20/2013, 03/27/2015  . Influenza,inj,Quad PF,36+ Mos 05/23/2014  . Pneumococcal Conjugate-13 04/24/2015  . Pneumococcal Polysaccharide-23 07/06/2009    Social History  Substance Use Topics  . Smoking status: Never Smoker   . Smokeless tobacco: Not on file  . Alcohol Use: No    Review of Systems  DATA OBTAINED: from patient, nurse, daughter GENERAL:  no fevers, fatigue, appetite changes SKIN: No itching, rash HEENT: brief nose bleed yesterday RESPIRATORY: No cough, wheezing, SOB CARDIAC: No chest pain, palpitations, =lower extremity edema  GI: No abdominal pain, No N/V/D or constipation, No heartburn or reflux  GU: No dysuria, frequency or urgency, or incontinence  MUSCULOSKELETAL: No unrelieved bone/joint pain NEUROLOGIC: No headache, dizziness  PSYCHIATRIC: No overt anxiety or sadness  Filed Vitals:   09/10/15 1845  BP: 137/65  Pulse: 50  Temp: 97.3 F (36.3 C)  Resp: 20    Physical Exam  GENERAL APPEARANCE: Alert, conversant, No acute distress  SKIN: No diaphoresis rash, or wounds HEENT: Unremarkable RESPIRATORY: Breathing is even, unlabored. Lung sounds are clear   CARDIOVASCULAR: Heart RRR no murmurs, rubs or gallops. 1+ peripheral edema R ankle only GASTROINTESTINAL: Abdomen is soft, non-tender, not distended w/ normal bowel sounds.  GENITOURINARY: Bladder non tender, not distended  MUSCULOSKELETAL: No abnormal joints or musculature NEUROLOGIC: Cranial nerves 2-12 grossly intact. Moves all extremities PSYCHIATRIC: Mood and affect appropriate to situation, no  behavioral issues  Patient Active Problem List   Diagnosis Date Noted  . Epistaxis 09/07/2015  . Supratherapeutic INR 08/30/2015  . CAP (community acquired pneumonia) 08/30/2015  . Asthma with exacerbation 08/14/2015  . PCP NOTES >>>>> 04/24/2015  . Hyperlipidemia 12/31/2014  . TIA (transient ischemic attack) 12/11/2014  . Dyslipidemia 12/11/2014  . History of CVA (cerebrovascular accident) 12/01/2014  . BP (high blood pressure) 12/01/2014  . Infection of urinary tract 12/01/2014  . Change in blood platelet  count 12/01/2014  . Chronic renal insufficiency, stage II (mild) 11/12/2013  . Impingement syndrome, shoulder, left 11/09/2013  . Acquired short bowel syndrome 11/09/2013  . Encounter for therapeutic drug monitoring 08/30/2013  . Hx pulmonary embolism 06/27/2013  . Warfarin anticoagulation 06/27/2013  . DDD (degenerative disc disease), lumbar 11/18/2012  . Mitral regurgitation 08/26/2011  . DYSPHAGIA UNSPECIFIED 07/31/2010  . ATRIAL FIBRILLATION WITH SLOW VENTRICULAR RESPONSE 07/08/2010  . COLON CANCER, HX OF 07/08/2010  . DYSPNEA/SHORTNESS OF BREATH 06/11/2010  . DIZZINESS 10/18/2009  . HYPOTENSION, ORTHOSTATIC 09/20/2009  . MUSCLE PAIN 08/23/2008  . Essential hypertension 12/16/2007  . EDEMA- LOCALIZED 12/16/2007  . HIP PAIN, CHRONIC 12/28/2006  . ANEMIA-NOS 07/15/2006  . Personal history of venous thrombosis and embolism 07/15/2006    CBC    Component Value Date/Time   WBC 6.2 09/03/2015 0539   WBC 5.0 06/09/2012 1509   RBC 3.41* 09/03/2015 0539   RBC 4.71 06/09/2012 1509   HGB 9.8* 09/03/2015 0539   HGB 15.2 06/09/2012 1509   HCT 30.0* 09/03/2015 0539   HCT 44.7 06/09/2012 1509   PLT 147* 09/03/2015 0539   PLT 119* 06/09/2012 1509   MCV 88.0 09/03/2015 0539   MCV 94.9 06/09/2012 1509   LYMPHSABS 1.1 08/30/2015 1110   LYMPHSABS 1.5 06/09/2012 1509   MONOABS 0.8 08/30/2015 1110   MONOABS 0.5 06/09/2012 1509   EOSABS 0.0 08/30/2015 1110   EOSABS 0.1  06/09/2012 1509   BASOSABS 0.0 08/30/2015 1110   BASOSABS 0.0 06/09/2012 1509    CMP     Component Value Date/Time   NA 137 09/03/2015 0539   NA 142 06/09/2012 1509   K 3.4* 09/03/2015 0539   K 3.9 06/09/2012 1509   CL 103 09/03/2015 0539   CL 108* 06/09/2012 1509   CO2 26 09/03/2015 0539   CO2 26 06/09/2012 1509   GLUCOSE 99 09/03/2015 0539   GLUCOSE 103* 06/09/2012 1509   BUN 25* 09/03/2015 0539   BUN 17.0 06/09/2012 1509   CREATININE 1.14* 09/03/2015 0539   CREATININE 1.14* 11/18/2012 1700   CREATININE 1.2* 06/09/2012 1509   CALCIUM 8.5* 09/03/2015 0539   CALCIUM 9.5 06/09/2012 1509   PROT 6.7 08/30/2015 1110   PROT 7.2 06/09/2012 1509   ALBUMIN 2.8* 08/30/2015 1110   ALBUMIN 4.2 06/09/2012 1509   AST 19 08/30/2015 1110   AST 17 06/09/2012 1509   ALT 9* 08/30/2015 1110   ALT 11 06/09/2012 1509   ALKPHOS 73 08/30/2015 1110   ALKPHOS 47 06/09/2012 1509   BILITOT 1.2 08/30/2015 1110   BILITOT 0.73 06/09/2012 1509   GFRNONAA 42* 09/03/2015 0539   GFRAA 49* 09/03/2015 0539    Assessment and Plan  LE EDEMA-  I have written for daily lasix;I have told pt and her daughter she should take a lasix holiday one day a week and they liked that idea.  Supratherapeutic INR Are waiting for INR to reach the low 2's then will restart coumadin at 2 mg daily; i shared this with pt and daughter    [name], MD

## 2015-09-10 NOTE — Assessment & Plan Note (Signed)
Are waiting for INR to reach the low 2's then will restart coumadin at 2 mg daily; i shared this with pt and daughter

## 2015-09-12 ENCOUNTER — Non-Acute Institutional Stay (SKILLED_NURSING_FACILITY): Payer: Medicare Other | Admitting: Internal Medicine

## 2015-09-12 ENCOUNTER — Encounter: Payer: Self-pay | Admitting: Internal Medicine

## 2015-09-12 DIAGNOSIS — R609 Edema, unspecified: Secondary | ICD-10-CM | POA: Diagnosis not present

## 2015-09-12 DIAGNOSIS — N289 Disorder of kidney and ureter, unspecified: Secondary | ICD-10-CM

## 2015-09-12 DIAGNOSIS — Z86718 Personal history of other venous thrombosis and embolism: Secondary | ICD-10-CM | POA: Diagnosis not present

## 2015-09-12 DIAGNOSIS — R791 Abnormal coagulation profile: Secondary | ICD-10-CM | POA: Diagnosis not present

## 2015-09-12 NOTE — Progress Notes (Signed)
Patient ID: [name], female   DOB: 1927-06-29, 82 y.o.   MRN: [id number] [id number]: [id number] Name: [name]  Sex: female Age: 82 y.o. DOB: 10-Apr-1927  [name] Facility/Room: 110 Level Of Care: SNF Provider: Wille Celeste Emergency Contacts: Extended Emergency Contact Information Primary Emergency Contact: [name],[name] Address: [address]          [place], [place] [address] [name] of [place] Phone: [phone] Mobile Phone: [phone] Relation: Daughter  Code Status:   Allergies: Review of patient's allergies indicates no known allergies.  Chief Complaint  Patient presents with  . Acute Visit    HPI: Patient is 82 y.o. female the recent history of hepatitis thought secondary to coagulopathy on chronic Coumadin with an elevated INR-also history of pneumonia requiring hospitalization-atrial fibrillation with slow ventricular response as well as history CVA and short bowel syndrome with apparently some element of chronic diarrhea.  She also has history of chronic renal insufficiency and chronic lower extremity edema.  She was seen earlier this week by Dr. [name] secondary to daughter's concerns of some increased edema lower extremities she had been put on Lasix apparently every M-W-F  in the hospital-this was decreased secondary to concerns of her renal function-.  Dr. [name] is considering increasing her Lasix to every day.  This is complicated with her history of renal insufficiency lab done on March 2 shows her creatinine is 1.64 BUN of 24 this appears to be somewhat above her previous levels.  Clinically patient appears to be at baseline vital signs appear to be stable recent blood pressures 115/65-127/60-I do see 93/41 but apparently this is infrequent but will have to be monitored.  Currently she has no acute complaints    Past Medical History  Diagnosis Date  . Anemia   . DVT (deep venous thrombosis) ([place]) 11/06  .  Pulmonary embolism ([place]) 11/06  . Colon cancer ([place]) 12/11  . Hypertension   . Atrial fibrillation ([place])   . Frequent headaches   . Heart murmur   . Stroke ([place]) 11/30/2014  . Colon cancer ([place]) 2011    Past Surgical History  Procedure Laterality Date  . Appendectomy  1952  . Total hip arthroplasty  01/03/07    Left hip replacement.  . Colectomy  06/2010    partial, Dr. Donne Hazel complicated by LLE CVT; S/P IVC umbrella & anemia  . Total abdominal hysterectomy w/ bilateral salpingoophorectomy  1973    For Fibroids  . Vena cava filter placement  06/28/2010  . Hip fracture surgery  2006    Trauma      Medication List       This list is accurate as of: 09/12/15 11:59 PM.  Always use your most recent med list.               acetaminophen 500 MG tablet  Commonly known as:  TYLENOL  Take 500 mg by mouth every 8 (eight) hours as needed for mild pain.     albuterol 108 (90 Base) MCG/ACT inhaler  Commonly known as:  VENTOLIN HFA  Inhale 2 puffs into the lungs every 6 (six) hours as needed for wheezing or shortness of breath.     ALPRAZolam 0.25 MG tablet  Commonly known as:  XANAX  Take 1 tablet (0.25 mg total) by mouth 2 (two) times daily as needed for anxiety.     amoxicillin-clavulanate 875-125 MG tablet  Commonly known as:  AUGMENTIN  Take 1 tablet by mouth  2 (two) times daily.     aspirin 81 MG tablet  Take 81 mg by mouth daily.     atorvastatin 20 MG tablet  Commonly known as:  LIPITOR  Take 1 tablet (20 mg total) by mouth daily.     CALCIUM 1200 PO  Take 2 tablets by mouth daily.     fentaNYL 50 MCG/HR  Commonly known as:  DURAGESIC - dosed mcg/hr  Place 1 patch (50 mcg total) onto the skin every 3 (three) days.     Fluticasone-Salmeterol 100-50 MCG/DOSE Aepb  Commonly known as:  ADVAIR DISKUS  Inhale 1 puff into the lungs 2 (two) times daily.     furosemide 40 MG tablet  Commonly known as:  LASIX  Take 1 tablet (40 mg total) by mouth every Monday,  Wednesday, and Friday.     metoprolol tartrate 25 MG tablet  Commonly known as:  LOPRESSOR  Take 1 tablet (25 mg total) by mouth 2 (two) times daily.     MULTIPLE VITAMINS/WOMENS PO  Take by mouth daily.     potassium chloride 10 MEQ tablet  Commonly known as:  K-DUR  Take 1 tablet (10 mEq total) by mouth daily.     promethazine 25 MG tablet  Commonly known as:  PHENERGAN  TAKE ONE TABLET BY MOUTH EVERY 6 TO 8 HOURS AS NEEDED     traMADol 50 MG tablet  Commonly known as:  ULTRAM  Take 1 tablet (50 mg total) by mouth every 6 (six) hours as needed. for pain     verapamil 120 MG tablet  Commonly known as:  CALAN  Take 1 tablet (120 mg total) by mouth 2 (two) times daily.     warfarin 2.5 MG tablet  Commonly known as:  COUMADIN  Take 1 tablet (2.5 mg total) by mouth daily at 6 PM. First dose start from 3/2          Immunization History  Administered Date(s) Administered  . Influenza Split 03/30/2012  . Influenza Whole 04/30/2006, 04/20/2008, 04/15/2009  . Influenza, High Dose Seasonal PF 04/20/2013, 03/27/2015  . Influenza,inj,Quad PF,36+ Mos 05/23/2014  . Pneumococcal Conjugate-13 04/24/2015  . Pneumococcal Polysaccharide-23 07/06/2009    Social History  Substance Use Topics  . Smoking status: Never Smoker   . Smokeless tobacco: Not on file  . Alcohol Use: No    Review of Systems  DATA OBTAINED: from patient, nurse, daughter GENERAL:  no fevers, fatigue, appetite changes SKIN: No itching, rash HEENT: brief nose bleed earlier this week RESPIRATORY: No cough, wheezing, SOB CARDIAC: No chest pain, palpitations, possibly some increased lower extremity edema  GI: No abdominal pain, No N/V/D or constipation, No heartburn or reflux some history of chronic diarrhea with history of short bowel syndrome GU: No dysuria, frequency or urgency, or incontinence  MUSCULOSKELETAL: No unrelieved bone/joint pain NEUROLOGIC: No headache, dizziness  PSYCHIATRIC: No overt  anxiety or sadness  Filed Vitals:   09/12/15 2133  BP: 115/65  Pulse: 90  Temp: 98.9 F (37.2 C)  Resp: 18    Physical Exam  GENERAL APPEARANCE: Alert, conversant, No acute distress  SKIN: No diaphoresis rash, or wounds HEENT: Unremarkable RESPIRATORY: Breathing is even, unlabored. Lung sounds are clear   CARDIOVASCULAR: Heart RRR no murmurs, rubs or gallops. 1+ peripheral edema Rleg  GASTROINTESTINAL: Abdomen is soft, non-tender, not distended w/ normal bowel sounds.  GENITOURINARY: Bladder non tender, not distended  MUSCULOSKELETAL: No abnormal joints or musculature NEUROLOGIC: Cranial nerves 2-12 grossly intact.  Moves all extremities PSYCHIATRIC: Mood and affect appropriate to situation, no behavioral issues  Patient Active Problem List   Diagnosis Date Noted  . Elevated brain natriuretic peptide (BNP) level 09/17/2015  . Acute renal failure superimposed on stage 3 chronic kidney disease ([facility]) 09/17/2015  . Diarrhea 09/17/2015  . Acute encephalopathy   . Hyperkalemia 09/16/2015  . Seizure ([facility]) 09/16/2015  . Seizures ([place]) 09/16/2015  . Nausea with vomiting 09/16/2015  . Syncope 09/16/2015  . Renal insufficiency 09/12/2015  . Epistaxis 09/07/2015  . Supratherapeutic INR 08/30/2015  . CAP (community acquired pneumonia) 08/30/2015  . Asthma with exacerbation 08/14/2015  . PCP NOTES >>>>> 04/24/2015  . Hyperlipidemia 12/31/2014  . TIA (transient ischemic attack) 12/11/2014  . Dyslipidemia 12/11/2014  . History of CVA (cerebrovascular accident) 12/01/2014  . BP (high blood pressure) 12/01/2014  . Infection of urinary tract 12/01/2014  . Change in blood platelet count 12/01/2014  . Chronic renal insufficiency, stage II (mild) 11/12/2013  . Impingement syndrome, shoulder, left 11/09/2013  . Acquired short bowel syndrome 11/09/2013  . Encounter for therapeutic drug monitoring 08/30/2013  . Hx pulmonary embolism 06/27/2013  . Warfarin anticoagulation 06/27/2013  . DDD  (degenerative disc disease), lumbar 11/18/2012  . Mitral regurgitation 08/26/2011  . DYSPHAGIA UNSPECIFIED 07/31/2010  . ATRIAL FIBRILLATION WITH SLOW VENTRICULAR RESPONSE 07/08/2010  . COLON CANCER, HX OF 07/08/2010  . DYSPNEA/SHORTNESS OF BREATH 06/11/2010  . DIZZINESS 10/18/2009  . HYPOTENSION, ORTHOSTATIC 09/20/2009  . MUSCLE PAIN 08/23/2008  . Essential hypertension 12/16/2007  . EDEMA- LOCALIZED 12/16/2007  . HIP PAIN, CHRONIC 12/28/2006  . ANEMIA-NOS 07/15/2006  . Personal history of venous thrombosis and embolism 07/15/2006     09/05/2015.  WBC 11.0 hemoglobin 10.7 platelets 223.  09/03/2015.  Sodium 136 potassium 4.7 BUN 24 creatinine 1.64     Assessment and Plan  #1-renal insufficiency-complicated with history of edema-we will order a metabolic panel stat in the a.m. Tomorrow to assess status of her renal issues-Dr. [name] is considering increasing her Lasix but would like to see where her creatinine stands-  #2 history of edema-again according to her daughter this somewhat increased more on the right-she is on anticoagulation with history of atrial fibrillation and apparent DVT --consider obtaining an updated doppler for follow-up however  #3 history of anticoagulation with Coumadin-does have a history of supratherapeutic INR-update INR is pending for tomorrow I did not see evidence of bleeding today       ,

## 2015-09-16 ENCOUNTER — Inpatient Hospital Stay (HOSPITAL_COMMUNITY): Payer: Medicare Other

## 2015-09-16 ENCOUNTER — Emergency Department (HOSPITAL_COMMUNITY): Payer: Medicare Other

## 2015-09-16 ENCOUNTER — Inpatient Hospital Stay (HOSPITAL_COMMUNITY)
Admission: EM | Admit: 2015-09-16 | Discharge: 2015-09-20 | DRG: 683 | Disposition: A | Discharge status: Skilled Nursing Facility | Payer: Medicare Other | Attending: Internal Medicine | Admitting: Internal Medicine

## 2015-09-16 ENCOUNTER — Encounter: Payer: Self-pay | Admitting: Internal Medicine

## 2015-09-16 ENCOUNTER — Encounter (HOSPITAL_COMMUNITY): Payer: Self-pay | Admitting: Emergency Medicine

## 2015-09-16 ENCOUNTER — Non-Acute Institutional Stay (SKILLED_NURSING_FACILITY): Payer: Medicare Other | Admitting: Internal Medicine

## 2015-09-16 DIAGNOSIS — R197 Diarrhea, unspecified: Secondary | ICD-10-CM | POA: Diagnosis not present

## 2015-09-16 DIAGNOSIS — I679 Cerebrovascular disease, unspecified: Secondary | ICD-10-CM | POA: Diagnosis not present

## 2015-09-16 DIAGNOSIS — R569 Unspecified convulsions: Secondary | ICD-10-CM

## 2015-09-16 DIAGNOSIS — N179 Acute kidney failure, unspecified: Secondary | ICD-10-CM | POA: Diagnosis not present

## 2015-09-16 DIAGNOSIS — R2689 Other abnormalities of gait and mobility: Secondary | ICD-10-CM | POA: Diagnosis not present

## 2015-09-16 DIAGNOSIS — Z7982 Long term (current) use of aspirin: Secondary | ICD-10-CM

## 2015-09-16 DIAGNOSIS — R4182 Altered mental status, unspecified: Secondary | ICD-10-CM | POA: Insufficient documentation

## 2015-09-16 DIAGNOSIS — Z86711 Personal history of pulmonary embolism: Secondary | ICD-10-CM

## 2015-09-16 DIAGNOSIS — R938 Abnormal findings on diagnostic imaging of other specified body structures: Secondary | ICD-10-CM | POA: Diagnosis not present

## 2015-09-16 DIAGNOSIS — E872 Acidosis: Secondary | ICD-10-CM | POA: Diagnosis present

## 2015-09-16 DIAGNOSIS — J189 Pneumonia, unspecified organism: Secondary | ICD-10-CM

## 2015-09-16 DIAGNOSIS — J90 Pleural effusion, not elsewhere classified: Secondary | ICD-10-CM | POA: Diagnosis not present

## 2015-09-16 DIAGNOSIS — I10 Essential (primary) hypertension: Secondary | ICD-10-CM | POA: Diagnosis not present

## 2015-09-16 DIAGNOSIS — I509 Heart failure, unspecified: Secondary | ICD-10-CM | POA: Diagnosis not present

## 2015-09-16 DIAGNOSIS — I482 Chronic atrial fibrillation: Secondary | ICD-10-CM | POA: Diagnosis present

## 2015-09-16 DIAGNOSIS — E785 Hyperlipidemia, unspecified: Secondary | ICD-10-CM | POA: Diagnosis present

## 2015-09-16 DIAGNOSIS — R55 Syncope and collapse: Secondary | ICD-10-CM

## 2015-09-16 DIAGNOSIS — K529 Noninfective gastroenteritis and colitis, unspecified: Secondary | ICD-10-CM | POA: Diagnosis present

## 2015-09-16 DIAGNOSIS — K912 Postsurgical malabsorption, not elsewhere classified: Secondary | ICD-10-CM | POA: Diagnosis present

## 2015-09-16 DIAGNOSIS — Z8673 Personal history of transient ischemic attack (TIA), and cerebral infarction without residual deficits: Secondary | ICD-10-CM

## 2015-09-16 DIAGNOSIS — N289 Disorder of kidney and ureter, unspecified: Secondary | ICD-10-CM

## 2015-09-16 DIAGNOSIS — R1111 Vomiting without nausea: Secondary | ICD-10-CM | POA: Diagnosis not present

## 2015-09-16 DIAGNOSIS — N183 Chronic kidney disease, stage 3 (moderate): Secondary | ICD-10-CM | POA: Diagnosis not present

## 2015-09-16 DIAGNOSIS — Z85038 Personal history of other malignant neoplasm of large intestine: Secondary | ICD-10-CM | POA: Diagnosis not present

## 2015-09-16 DIAGNOSIS — Z86718 Personal history of other venous thrombosis and embolism: Secondary | ICD-10-CM

## 2015-09-16 DIAGNOSIS — E875 Hyperkalemia: Secondary | ICD-10-CM | POA: Diagnosis present

## 2015-09-16 DIAGNOSIS — Z96642 Presence of left artificial hip joint: Secondary | ICD-10-CM | POA: Diagnosis present

## 2015-09-16 DIAGNOSIS — R112 Nausea with vomiting, unspecified: Secondary | ICD-10-CM | POA: Insufficient documentation

## 2015-09-16 DIAGNOSIS — R799 Abnormal finding of blood chemistry, unspecified: Secondary | ICD-10-CM | POA: Diagnosis not present

## 2015-09-16 DIAGNOSIS — I129 Hypertensive chronic kidney disease with stage 1 through stage 4 chronic kidney disease, or unspecified chronic kidney disease: Secondary | ICD-10-CM | POA: Diagnosis present

## 2015-09-16 DIAGNOSIS — R609 Edema, unspecified: Secondary | ICD-10-CM

## 2015-09-16 DIAGNOSIS — R600 Localized edema: Secondary | ICD-10-CM | POA: Diagnosis not present

## 2015-09-16 DIAGNOSIS — R7989 Other specified abnormal findings of blood chemistry: Secondary | ICD-10-CM | POA: Diagnosis present

## 2015-09-16 DIAGNOSIS — G9340 Encephalopathy, unspecified: Secondary | ICD-10-CM | POA: Diagnosis not present

## 2015-09-16 DIAGNOSIS — I517 Cardiomegaly: Secondary | ICD-10-CM | POA: Diagnosis not present

## 2015-09-16 DIAGNOSIS — Z7901 Long term (current) use of anticoagulants: Secondary | ICD-10-CM

## 2015-09-16 DIAGNOSIS — I639 Cerebral infarction, unspecified: Secondary | ICD-10-CM | POA: Diagnosis not present

## 2015-09-16 DIAGNOSIS — I4891 Unspecified atrial fibrillation: Secondary | ICD-10-CM | POA: Diagnosis not present

## 2015-09-16 DIAGNOSIS — M6281 Muscle weakness (generalized): Secondary | ICD-10-CM | POA: Diagnosis not present

## 2015-09-16 LAB — CBC WITH DIFFERENTIAL/PLATELET
Basophils Absolute: 0 K/uL (ref 0.0–0.1)
Basophils Relative: 0 %
Eosinophils Absolute: 0.1 K/uL (ref 0.0–0.7)
Eosinophils Relative: 1 %
HCT: 38.3 % (ref 36.0–46.0)
Hemoglobin: 11.6 g/dL — ABNORMAL LOW (ref 12.0–15.0)
Lymphocytes Relative: 6 %
Lymphs Abs: 0.6 K/uL — ABNORMAL LOW (ref 0.7–4.0)
MCH: 26.9 pg (ref 26.0–34.0)
MCHC: 30.3 g/dL (ref 30.0–36.0)
MCV: 88.9 fL (ref 78.0–100.0)
Monocytes Absolute: 0.8 K/uL (ref 0.1–1.0)
Monocytes Relative: 8 %
Neutro Abs: 8.2 K/uL — ABNORMAL HIGH (ref 1.7–7.7)
Neutrophils Relative %: 85 %
Platelets: 204 K/uL (ref 150–400)
RBC: 4.31 MIL/uL (ref 3.87–5.11)
RDW: 15.9 % — ABNORMAL HIGH (ref 11.5–15.5)
WBC: 9.6 K/uL (ref 4.0–10.5)

## 2015-09-16 LAB — COMPREHENSIVE METABOLIC PANEL WITH GFR
ALT: 12 U/L — ABNORMAL LOW (ref 14–54)
AST: 29 U/L (ref 15–41)
Albumin: 3 g/dL — ABNORMAL LOW (ref 3.5–5.0)
Alkaline Phosphatase: 97 U/L (ref 38–126)
Anion gap: 20 — ABNORMAL HIGH (ref 5–15)
BUN: 29 mg/dL — ABNORMAL HIGH (ref 6–20)
CO2: 18 mmol/L — ABNORMAL LOW (ref 22–32)
Calcium: 10.5 mg/dL — ABNORMAL HIGH (ref 8.9–10.3)
Chloride: 99 mmol/L — ABNORMAL LOW (ref 101–111)
Creatinine, Ser: 3.87 mg/dL — ABNORMAL HIGH (ref 0.44–1.00)
GFR calc Af Amer: 11 mL/min — ABNORMAL LOW
GFR calc non Af Amer: 10 mL/min — ABNORMAL LOW
Glucose, Bld: 214 mg/dL — ABNORMAL HIGH (ref 65–99)
Potassium: 6.5 mmol/L (ref 3.5–5.1)
Sodium: 137 mmol/L (ref 135–145)
Total Bilirubin: 0.8 mg/dL (ref 0.3–1.2)
Total Protein: 6.7 g/dL (ref 6.5–8.1)

## 2015-09-16 LAB — URINALYSIS, ROUTINE W REFLEX MICROSCOPIC
Bilirubin Urine: NEGATIVE
Glucose, UA: NEGATIVE mg/dL
Hgb urine dipstick: NEGATIVE
Ketones, ur: NEGATIVE mg/dL
Leukocytes, UA: NEGATIVE
Nitrite: NEGATIVE
Protein, ur: 30 mg/dL — AB
Specific Gravity, Urine: 1.014 (ref 1.005–1.030)
pH: 5 (ref 5.0–8.0)

## 2015-09-16 LAB — I-STAT CHEM 8, ED
BUN: 32 mg/dL — ABNORMAL HIGH (ref 6–20)
Calcium, Ion: 1.11 mmol/L — ABNORMAL LOW (ref 1.13–1.30)
Chloride: 104 mmol/L (ref 101–111)
Creatinine, Ser: 3.4 mg/dL — ABNORMAL HIGH (ref 0.44–1.00)
Glucose, Bld: 154 mg/dL — ABNORMAL HIGH (ref 65–99)
HCT: 34 % — ABNORMAL LOW (ref 36.0–46.0)
Hemoglobin: 11.6 g/dL — ABNORMAL LOW (ref 12.0–15.0)
Potassium: 6.2 mmol/L (ref 3.5–5.1)
Sodium: 135 mmol/L (ref 135–145)
TCO2: 22 mmol/L (ref 0–100)

## 2015-09-16 LAB — PROTIME-INR
INR: 2.21 — ABNORMAL HIGH (ref 0.00–1.49)
Prothrombin Time: 24.3 s — ABNORMAL HIGH (ref 11.6–15.2)

## 2015-09-16 LAB — I-STAT TROPONIN, ED: Troponin i, poc: 0.01 ng/mL (ref 0.00–0.08)

## 2015-09-16 LAB — URINE MICROSCOPIC-ADD ON: RBC / HPF: NONE SEEN RBC/hpf (ref 0–5)

## 2015-09-16 LAB — LIPASE, BLOOD: Lipase: 22 U/L (ref 11–51)

## 2015-09-16 LAB — I-STAT CG4 LACTIC ACID, ED
Lactic Acid, Venous: 2.63 mmol/L (ref 0.5–2.0)
Lactic Acid, Venous: 2.47 mmol/L (ref 0.5–2.0)

## 2015-09-16 LAB — BRAIN NATRIURETIC PEPTIDE: B Natriuretic Peptide: 555 pg/mL — ABNORMAL HIGH (ref 0.0–100.0)

## 2015-09-16 MED ORDER — LEVALBUTEROL HCL 0.63 MG/3ML IN NEBU
0.6300 mg | INHALATION_SOLUTION | Freq: Four times a day (QID) | RESPIRATORY_TRACT | Status: DC | PRN
  Administered 2015-09-19: 0.63 mg via RESPIRATORY_TRACT
  Filled 2015-09-16 (×2): qty 3

## 2015-09-16 MED ORDER — ACETAMINOPHEN 325 MG PO TABS: 650.0000 mg | ORAL_TABLET | ORAL | Status: DC | PRN

## 2015-09-16 MED ORDER — FENTANYL 50 MCG/HR TD PT72: 50.0000 ug | MEDICATED_PATCH | TRANSDERMAL | Status: DC

## 2015-09-16 MED ORDER — SODIUM CHLORIDE 0.9 % IV BOLUS (SEPSIS)
1000.0000 mL | Freq: Once | INTRAVENOUS | Status: AC
  Administered 2015-09-16: 1000 mL via INTRAVENOUS

## 2015-09-16 MED ORDER — ALPRAZOLAM 0.25 MG PO TABS
0.2500 mg | ORAL_TABLET | Freq: Two times a day (BID) | ORAL | Status: DC | PRN
  Administered 2015-09-19: 0.25 mg via ORAL
  Filled 2015-09-16: qty 1

## 2015-09-16 MED ORDER — ACETAMINOPHEN 500 MG PO TABS: 500.0000 mg | ORAL_TABLET | Freq: Three times a day (TID) | ORAL | Status: DC | PRN

## 2015-09-16 MED ORDER — ONDANSETRON HCL 4 MG/2ML IJ SOLN: 4.0000 mg | Freq: Four times a day (QID) | INTRAMUSCULAR | Status: DC | PRN

## 2015-09-16 MED ORDER — SODIUM CHLORIDE 0.9% FLUSH: 3.0000 mL | INTRAVENOUS | Status: DC | PRN

## 2015-09-16 MED ORDER — ONDANSETRON HCL 4 MG/2ML IJ SOLN
4.0000 mg | Freq: Once | INTRAMUSCULAR | Status: AC
  Administered 2015-09-16: 4 mg via INTRAVENOUS
  Filled 2015-09-16: qty 2

## 2015-09-16 MED ORDER — METOPROLOL TARTRATE 25 MG PO TABS: 25.0000 mg | ORAL_TABLET | Freq: Two times a day (BID) | ORAL | Status: DC

## 2015-09-16 MED ORDER — SACCHAROMYCES BOULARDII 250 MG PO CAPS
250.0000 mg | ORAL_CAPSULE | Freq: Two times a day (BID) | ORAL | Status: DC
  Administered 2015-09-16 – 2015-09-20 (×8): 250 mg via ORAL
  Filled 2015-09-16 (×8): qty 1

## 2015-09-16 MED ORDER — TRAMADOL HCL 50 MG PO TABS
50.0000 mg | ORAL_TABLET | Freq: Four times a day (QID) | ORAL | Status: DC | PRN
  Administered 2015-09-19: 50 mg via ORAL
  Filled 2015-09-16: qty 1

## 2015-09-16 MED ORDER — SODIUM CHLORIDE 0.9 % IV SOLN
250.0000 mL | INTRAVENOUS | Status: DC | PRN
Start: 2015-09-16 — End: 2015-09-20

## 2015-09-16 MED ORDER — SODIUM CHLORIDE 0.9% FLUSH
3.0000 mL | Freq: Two times a day (BID) | INTRAVENOUS | Status: DC
  Administered 2015-09-16 – 2015-09-19 (×5): 3 mL via INTRAVENOUS

## 2015-09-16 MED ORDER — VERAPAMIL HCL 120 MG PO TABS
120.0000 mg | ORAL_TABLET | Freq: Two times a day (BID) | ORAL | Status: DC
  Administered 2015-09-16: 120 mg via ORAL
  Filled 2015-09-16 (×2): qty 1

## 2015-09-16 NOTE — ED Provider Notes (Signed)
CSN: [id number]     Arrival date & time 09/16/15  1459 History   First MD Initiated Contact with Patient 09/16/15 1504     Chief Complaint  Patient presents with  . Seizures     (Consider location/radiation/quality/duration/timing/severity/associated sxs/prior Treatment) HPI   [name] is am 82 y.o F with a pmhx of CVA, PE, a.fib, HTN, who presents to the ED today c/o new onset seizure like activity. Pts daughter states that pt has had complaints of generalized weakness, poor appetite and nausea over the last 2 days. Today pt was sitting on the edge of the bed and began vomiting when her head dropped and she began having full body muscle contractions. This episode lasted for 1 minute in patient came back to baseline. Patient had a repeat episode within 5 minutes that lasted again for 1 minute. No bladder incontinence. No postictal state. No history of seizures. Patient's blood sugar was checked and this was almost 300 which is unusual for patient. No history of diabetes. 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 Patient denies chest pain, dizziness, shortness of breath, abdominal pain, dysuria, melena, hematochezia, fevers, chills.  Of note, patient was recently admitted to the hospital on 08/29/2014 for coagulopathy, INR greater than 10. Patient was given vitamin K and FFP and she is withholding her Coumadin since that time. She was supposed to begin taking it again tonight. On her admission she was also found to have pneumonia and left arm cellulitis which she has completed her antibiotics for.  Past Medical History  Diagnosis Date  . Anemia   . DVT (deep venous thrombosis) ([place]) 11/06  . Pulmonary embolism ([place]) 11/06  . Colon cancer ([place]) 12/11  . Hypertension   . Atrial fibrillation ([place])   . Frequent headaches   . Heart murmur   . Stroke ([place]) 11/30/2014  . Colon  cancer ([place]) 2011   Past Surgical History  Procedure Laterality Date  . Appendectomy  1952  . Total hip arthroplasty  01/03/07    Left hip replacement.  . Colectomy  06/2010    partial, Dr. Donne Hazel complicated by LLE CVT; S/P IVC umbrella & anemia  . Total abdominal hysterectomy w/ bilateral salpingoophorectomy  1973    For Fibroids  . Vena cava filter placement  06/28/2010  . Hip fracture surgery  2006    Trauma   Family History  Problem Relation Age of Onset  . Peripheral vascular disease Father   . Heart attack Mother 61  . Heart disease Mother     MI  . Coronary artery disease Sister   . Diabetes Paternal Grandmother   . Coronary artery disease Maternal Grandfather    Social History  Substance Use Topics  . Smoking status: Never Smoker   . Smokeless tobacco: None  . Alcohol Use: No   OB History    No data available     Review of Systems  All other systems reviewed and are negative.     Allergies  Review of patient's allergies indicates no known allergies.  Home Medications   Prior to Admission medications   Medication Sig Start Date End Date Taking? Authorizing Provider  acetaminophen (TYLENOL) 500 MG tablet Take 500 mg by mouth every 8 (eight) hours as needed for mild pain.     Historical Provider, MD  albuterol (VENTOLIN HFA) 108 (90 Base) MCG/ACT inhaler  Inhale 2 puffs into the lungs every 6 (six) hours as needed for wheezing or shortness of breath. 08/14/15   Colon Branch, MD  ALPRAZolam Duanne Moron) 0.25 MG tablet Take 1 tablet (0.25 mg total) by mouth 2 (two) times daily as needed for anxiety. 08/14/15   Colon Branch, MD  amoxicillin-clavulanate (AUGMENTIN) 875-125 MG tablet Take 1 tablet by mouth 2 (two) times daily. 09/03/15   Florencia Reasons, MD  aspirin 81 MG tablet Take 81 mg by mouth daily.    Historical Provider, MD  atorvastatin (LIPITOR) 20 MG tablet Take 1 tablet (20 mg total) by mouth daily. 08/28/15   Colon Branch, MD  Calcium Carbonate-Vit D-Min (CALCIUM 1200 PO)  Take 1 tablet by mouth daily.     Historical Provider, MD  fentaNYL (DURAGESIC - DOSED MCG/HR) 50 MCG/HR Place 1 patch (50 mcg total) onto the skin every 3 (three) days. 09/03/15   Florencia Reasons, MD  Fluticasone-Salmeterol (ADVAIR DISKUS) 100-50 MCG/DOSE AEPB Inhale 1 puff into the lungs 2 (two) times daily. 07/12/15   Colon Branch, MD  furosemide (LASIX) 40 MG tablet Take 1 tablet (40 mg total) by mouth every Monday, Wednesday, and Friday. 09/03/15   Florencia Reasons, MD  metoprolol tartrate (LOPRESSOR) 25 MG tablet Take 1 tablet (25 mg total) by mouth 2 (two) times daily. 08/14/15   Colon Branch, MD  Multiple Vitamins-Minerals (MULTIPLE VITAMINS/WOMENS PO) Take by mouth daily.    Historical Provider, MD  potassium chloride (K-DUR) 10 MEQ tablet Take 1 tablet (10 mEq total) by mouth daily. 09/03/15   Florencia Reasons, MD  promethazine (PHENERGAN) 25 MG tablet TAKE ONE TABLET BY MOUTH EVERY 6 TO 8 HOURS AS NEEDED Patient taking differently: Take 25 mg by mouth every 4 (four) hours as needed. TAKE ONE TABLET BY MOUTH EVERY 6 TO 8 HOURS AS NEEDED 05/11/14   Hendricks Limes, MD  traMADol (ULTRAM) 50 MG tablet Take 1 tablet (50 mg total) by mouth every 6 (six) hours as needed. for pain 09/03/15   Florencia Reasons, MD  verapamil (CALAN) 120 MG tablet Take 1 tablet (120 mg total) by mouth 2 (two) times daily. 08/14/15   Colon Branch, MD  warfarin (COUMADIN) 2.5 MG tablet Take 1 tablet (2.5 mg total) by mouth daily at 6 PM. First dose start from 3/2 09/05/15   Florencia Reasons, MD   BP 126/60 mmHg  Pulse 70  Temp(Src) 97.7 F (36.5 C) (Oral)  Resp 16  SpO2 94% Physical Exam  Constitutional: She is oriented to person, place, and time. She appears well-developed and well-nourished. No distress.  Ill-appearing, frail female lying in bed. Patient actively vomiting.  HENT:  Head: Normocephalic and atraumatic.  Mouth/Throat: No oropharyngeal exudate.  Eyes: Conjunctivae and EOM are normal. Pupils are equal, round, and reactive to light. Right eye exhibits no  discharge. Left eye exhibits no discharge. No scleral icterus.  Neck: Neck supple.  Cardiovascular: Normal rate, regular rhythm, normal heart sounds and intact distal pulses.  Exam reveals no gallop and no friction rub.   No murmur heard. Pulmonary/Chest: Effort normal and breath sounds normal. No respiratory distress. She has no wheezes. She has no rales. She exhibits no tenderness.  Abdominal: Soft. Bowel sounds are normal. She exhibits no distension and no mass. There is no tenderness. There is no rebound and no guarding.  Musculoskeletal: Normal range of motion. She exhibits edema.  Right lower extremity swelling with pitting edema tracking up to the mid thigh.  Intact distal pulses. No erythema or warmth. No calf tenderness.  Lymphadenopathy:    She has no cervical adenopathy.  Neurological: She is alert and oriented to person, place, and time. No cranial nerve deficit.  Strength 5/5 throughout. No sensory deficits.    Skin: Skin is warm and dry. No rash noted. She is not diaphoretic. No erythema. No pallor.  Psychiatric: She has a normal mood and affect. Her behavior is normal.  Nursing note and vitals reviewed.   ED Course  Procedures (including critical care time) Labs Review Labs Reviewed  COMPREHENSIVE METABOLIC PANEL - Abnormal; Notable for the following:    Potassium 6.5 (*)    Chloride 99 (*)    CO2 18 (*)    Glucose, Bld 214 (*)    BUN 29 (*)    Creatinine, Ser 3.87 (*)    Calcium 10.5 (*)    Albumin 3.0 (*)    ALT 12 (*)    GFR calc non Af Amer 10 (*)    GFR calc Af Amer 11 (*)    Anion gap 20 (*)    All other components within normal limits  CBC WITH DIFFERENTIAL/PLATELET - Abnormal; Notable for the following:    Hemoglobin 11.6 (*)    RDW 15.9 (*)    Neutro Abs 8.2 (*)    Lymphs Abs 0.6 (*)    All other components within normal limits  URINALYSIS, ROUTINE W REFLEX MICROSCOPIC (NOT AT [facility]) - Abnormal; Notable for the following:    Color, Urine AMBER (*)     APPearance HAZY (*)    Protein, ur 30 (*)    All other components within normal limits  PROTIME-INR - Abnormal; Notable for the following:    Prothrombin Time 24.3 (*)    INR 2.21 (*)    All other components within normal limits  BRAIN NATRIURETIC PEPTIDE - Abnormal; Notable for the following:    B Natriuretic Peptide 555.0 (*)    All other components within normal limits  URINE MICROSCOPIC-ADD ON - Abnormal; Notable for the following:    Squamous Epithelial / LPF 6-30 (*)    Bacteria, UA FEW (*)    Casts HYALINE CASTS (*)    All other components within normal limits  I-STAT CG4 LACTIC ACID, ED - Abnormal; Notable for the following:    Lactic Acid, Venous 2.63 (*)    All other components within normal limits  I-STAT CG4 LACTIC ACID, ED - Abnormal; Notable for the following:    Lactic Acid, Venous 2.47 (*)    All other components within normal limits  I-STAT CHEM 8, ED - Abnormal; Notable for the following:    Potassium 6.2 (*)    BUN 32 (*)    Creatinine, Ser 3.40 (*)    Glucose, Bld 154 (*)    Calcium, Ion 1.11 (*)    Hemoglobin 11.6 (*)    HCT 34.0 (*)    All other components within normal limits  GASTROINTESTINAL PANEL BY PCR, STOOL (REPLACES STOOL CULTURE)  MRSA PCR SCREENING  LIPASE, BLOOD  BASIC METABOLIC PANEL  CBC WITH DIFFERENTIAL/PLATELET  CREATININE, URINE, RANDOM  UREA NITROGEN, URINE  LACTIC ACID, PLASMA  LACTIC ACID, PLASMA  PROTIME-INR  I-STAT TROPOININ, ED    Imaging Review No results found. I have personally reviewed and evaluated these images and lab results as part of my medical decision-making.   EKG Interpretation   Date/Time:  Monday September 16 2015 15:27:56 EDT Ventricular Rate:  65 PR Interval:  QRS Duration: 114 QT Interval:  436 QTC Calculation: 453 R Axis:   -53 Text Interpretation:  Atrial fibrillation Left anterior fascicular block  Abnormal R-wave progression, late transition LVH with secondary  repolarization abnormality  Confirmed by RAY MD, Andee Poles ([id number]) on  09/16/2015 3:31:13 PM      MDM   Final diagnoses:  AKI (acute kidney injury) ([place])  Hyperkalemia  ARF (acute renal failure) ([place])  Seizure ([place])     82 year old female with history of CVA, PE, A. fib presents to the ED complaining of seizure-like activity witnessed by daughter. See history of present illness for further details. Given history and duration of symptoms felt this was likely due to a syncopal event as opposed to a seizure. Patient given IV fluids and Zofran with symptomatic relief. Patient is afebrile. All vital signs are stable. No hypotension. No SIRS or sepsis criteria met. No leukocytosis. Patient's creatinine has nearly doubled since last admission it is now 3.87. Potassium is 6.5. No EKG changes noted. No peaked T waves. We will defer calcium and bicarbonate this time. Initial lactic acid was elevated at 2.63 but is trending down after fluids. Do not think this is due to sepsis. Bilateral lower lobe opacities seen on chest x-ray which is consistent with her diagnosis of pneumonia on her last admission. She has completed her course of antibiotics. UA negative for infection. Troponin within normal limits. Patient has chronic right lower extremity swelling per daughter which she takes Lasix for. Do not think this is due to DVT. Patient is not hypoxic or tachycardic. Doubt PE. Of note, on last admission patient was hypercoagulable with an INR of greater than 10. She was given vitamin K and FFP at that time and they've been holding her Coumadin since then and trending her INRs. She was supposed to get a dose of Coumadin this evening. INR today is 2.21. Plan to admit patient to hospitalist service for AKI and hyperkalemia. Patient is currently hemodynamically stable and symptomatically improved after Zofran and fluids.  Patient was discussed with and seen by Dr. Jeanell Sparrow who agrees with the treatment plan.     Dondra Spry [place],  PA-C 09/17/15 [id number]  Pattricia Boss, MD 09/19/15 1235

## 2015-09-16 NOTE — ED Notes (Signed)
Pt from [place] home

## 2015-09-16 NOTE — H&P (Signed)
[facility] History and Physical  [name] [id number] DOB: 07/07/1926 DOA: 09/16/2015  Referring physician: ED PCP: Kathlene November, MD   Chief Complaint: Unresponsiveness  HPI:  [name] is a 82 year old female with a past medical history significant for HTN, HLD, atrial fibrillation on anticoagulation, colon cancer s/p resection with short gut syndrome, CVA, DVT/PE; who presents after having 2 episodes of seizure like activity with unresponsiveness. The patient and her daughter provide history. Patient had been at [facility] rehabilitation center after her recent hospitalization for epistaxis with hypercoagulability(INR >10) and pneumonia 2/24 - 2/28. Over the last 2 days or so she had had decreased appetite with nausea, generalized weakness, and multiple episodes of diarrhea. However, today the patient was reportedly sitting on edge of bed and had vomited and then dropped her head and had less than a minute of full body movement with eyes open mouth moving, but the patient was not responsive. When the patient came to make it given her 25 mg of Zofran check files noted that her blood sugar was elevated at 270. Subsequently patient had a another episode some minutes later the last visit; left in time prior to EMS being called. Her daughter notes that the patient likely loss of bowel, but this is not out of the norm for the patient. No reported tongue biting. There was no trauma to her head. Patient currently denies feeling nauseous at this time and has had no subsequent episodes. She denies any chest pain, shortness of breath, abdominal pain, recent falls, fever, chills, or significant cough. She does complain of right lower extremity swelling will for which she's been on Lasix for quite some time. During her last hospitalization she was placed on potassium and this was continued even after discharge. Patient had never been on potassium supplementation in the past. Also, patient had recently  received antibiotics for the possible pneumonia during her last hospitalization. Diarrhea is normally common for short periods in time and patient had been having these symptoms over the last week. She cannot quantify the number of episodes and stools were yellow in color and liquid in consistency.  Upon admission the patient was evaluated with CT of the brain which showed no acute signs of a bleed or infarct and stated chronic changes seen on previous CTs.  Chest x-ray which was similar to previous showing patchy basilar opacities similar to previous imaging. Lab work revealed potassium 6.5, BUN 29, creatinine 3.87, glucose 214, BNP 555, and hemoglobin 11.6. Patient's EKG showed no acute changes.  Review of Systems  Constitutional: Positive for malaise/fatigue. Negative for fever and chills.  HENT: Negative for hearing loss and tinnitus.   Eyes: Negative for photophobia and pain.  Respiratory: Negative for sputum production and shortness of breath.   Cardiovascular: Positive for leg swelling. Negative for palpitations.  Gastrointestinal: Positive for nausea and vomiting.  Genitourinary: Negative for urgency and frequency.  Musculoskeletal: Positive for joint pain. Negative for back pain.  Skin: Negative for itching and rash.  Neurological: Positive for seizures, loss of consciousness and weakness.        Past Medical History  Diagnosis Date  . Anemia   . DVT (deep venous thrombosis) ([place]) 11/06  . Pulmonary embolism ([place]) 11/06  . Colon cancer ([place]) 12/11  . Hypertension   . Atrial fibrillation ([place])   . Frequent headaches   . Heart murmur   . Stroke ([place]) 11/30/2014  . Colon cancer [facility]) 2011     Past Surgical History  Procedure Laterality Date  . Appendectomy  1952  . Total hip arthroplasty  01/03/07    Left hip replacement.  . Colectomy  06/2010    partial, Dr. Donne Hazel complicated by LLE CVT; S/P IVC umbrella & anemia  . Total abdominal hysterectomy w/ bilateral  salpingoophorectomy  1973    For Fibroids  . Vena cava filter placement  06/28/2010  . Hip fracture surgery  2006    Trauma      Social History:  reports that she has never smoked. She does not have any smokeless tobacco history on file. She reports that she does not drink alcohol or use illicit drugs. Patient was at [facility] rehabilitation center here recently, but lives with her daughter normally.  No Known Allergies  Family History  Problem Relation Age of Onset  . Peripheral vascular disease Father   . Heart attack Mother 51  . Heart disease Mother     MI  . Coronary artery disease Sister   . Diabetes Paternal Grandmother   . Coronary artery disease Maternal Grandfather       Prior to Admission medications   Medication Sig Start Date End Date Taking? Authorizing Provider  acetaminophen (TYLENOL) 500 MG tablet Take 500 mg by mouth every 8 (eight) hours as needed for mild pain.    Yes Historical Provider, MD  albuterol (VENTOLIN HFA) 108 (90 Base) MCG/ACT inhaler Inhale 2 puffs into the lungs every 6 (six) hours as needed for wheezing or shortness of breath. 08/14/15  Yes Colon Branch, MD  ALPRAZolam Duanne Moron) 0.25 MG tablet Take 1 tablet (0.25 mg total) by mouth 2 (two) times daily as needed for anxiety. 08/14/15  Yes Colon Branch, MD  bisacodyl (DULCOLAX) 10 MG suppository Place 10 mg rectally as needed for moderate constipation.   Yes Historical Provider, MD  Calcium Carbonate-Vit D-Min (CALCIUM 1200 PO) Take 2 tablets by mouth daily.    Yes Historical Provider, MD  fentaNYL (DURAGESIC - DOSED MCG/HR) 50 MCG/HR Place 1 patch (50 mcg total) onto the skin every 3 (three) days. 09/03/15  Yes Florencia Reasons, MD  Fluticasone-Salmeterol (ADVAIR DISKUS) 100-50 MCG/DOSE AEPB Inhale 1 puff into the lungs 2 (two) times daily. 07/12/15  Yes Colon Branch, MD  furosemide (LASIX) 40 MG tablet Take 1 tablet (40 mg total) by mouth every Monday, Wednesday, and Friday. Patient taking differently: Take 40 mg by  mouth daily.  09/03/15  Yes Florencia Reasons, MD  magnesium hydroxide (MILK OF MAGNESIA) 400 MG/5ML suspension Take 30 mLs by mouth daily as needed for moderate constipation.   Yes Historical Provider, MD  metoprolol tartrate (LOPRESSOR) 25 MG tablet Take 1 tablet (25 mg total) by mouth 2 (two) times daily. 08/14/15  Yes Colon Branch, MD  Multiple Vitamins-Minerals (MULTIPLE VITAMINS/WOMENS PO) Take by mouth daily.   Yes Historical Provider, MD  OVER THE COUNTER MEDICATION Take 1 Container by mouth every evening.   Yes Historical Provider, MD  potassium chloride (K-DUR) 10 MEQ tablet Take 1 tablet (10 mEq total) by mouth daily. 09/03/15  Yes Florencia Reasons, MD  promethazine (PHENERGAN) 25 MG tablet TAKE ONE TABLET BY MOUTH EVERY 6 TO 8 HOURS AS NEEDED Patient taking differently: Take 25 mg by mouth every 4 (four) hours as needed. TAKE ONE TABLET BY MOUTH EVERY 6 TO 8 HOURS AS NEEDED 05/11/14  Yes Hendricks Limes, MD  saccharomyces boulardii (FLORASTOR) 250 MG capsule Take 250 mg by mouth 2 (two) times daily.   Yes  Historical Provider, MD  sodium phosphate (FLEET) enema Place 1 enema rectally once. follow package directions   Yes Historical Provider, MD  traMADol (ULTRAM) 50 MG tablet Take 1 tablet (50 mg total) by mouth every 6 (six) hours as needed. for pain 09/03/15  Yes Florencia Reasons, MD  verapamil (CALAN) 120 MG tablet Take 1 tablet (120 mg total) by mouth 2 (two) times daily. 08/14/15  Yes Colon Branch, MD  amoxicillin-clavulanate (AUGMENTIN) 875-125 MG tablet Take 1 tablet by mouth 2 (two) times daily. 09/03/15   Florencia Reasons, MD  atorvastatin (LIPITOR) 20 MG tablet Take 1 tablet (20 mg total) by mouth daily. 08/28/15   Colon Branch, MD  warfarin (COUMADIN) 2.5 MG tablet Take 1 tablet (2.5 mg total) by mouth daily at 6 PM. First dose start from 3/2 09/05/15   Florencia Reasons, MD     Physical Exam: Filed Vitals:   09/16/15 1500 09/16/15 1700 09/16/15 1830  BP: 126/60 100/51 101/57  Pulse: 70 69 71  Temp: 97.7 F (36.5 C)    TempSrc: Oral     Resp: 16 29 16   SpO2: 94% 95% 93%     Constitutional: Vital signs reviewed. Patient is a elderly female who appears be in no acute distress at this time able to answer questions and history.  Head: Normocephalic and atraumatic  Ear: TM normal bilaterally  Mouth: no erythema or exudates, MMM  Eyes: PERRL, EOMI, conjunctivae normal, No scleral icterus.  Neck: Supple, Trachea midline normal ROM, No JVD, mass, thyromegaly, or carotid bruit present.  Cardiovascular: RRR, S1 normal, S2 normal, no MRG, pulses symmetric and intact bilaterally  Pulmonary/Chest: CTAB, no wheezes, rales, or rhonchi  Abdominal: Soft. Non-tender, non-distended, bowel sounds are normal, no masses, organomegaly, or guarding present.  GU: no CVA tenderness Musculoskeletal: No joint deformities, erythema, or stiffness, ROM full and no nontender Ext: Right lower extremity swelling 2+ and no cyanosis, pulses palpable bilaterally (DP and PT)  Hematology: no cervical, inginal, or axillary adenopathy.  Neurological: A&O x3, Strenght is normal and symmetric bilaterally, cranial nerve II-XII are grossly intact, no focal motor deficit, sensory intact to light touch bilaterally.  Skin: Warm, dry and intact. No rash, cyanosis, or clubbing.  Psychiatric: Normal mood and affect. speech and behavior is normal. Judgment and thought content normal. Cognition and memory are normal.      Data Review   Micro Results No results found for this or any previous visit (from the past 240 hour(s)).  Radiology Reports Dg Chest 2 View  09/16/2015  CLINICAL DATA:  Seizures.  Clinical concern for pneumonia. EXAM: CHEST  2 VIEW COMPARISON:  08/30/2015 chest radiograph. FINDINGS: Stable cardiomediastinal silhouette with mild cardiomegaly. No pneumothorax. No pleural effusion. Patchy consolidation in the lower lobes is not definitely changed from 08/30/2015. No pulmonary edema. No additional sites of consolidation. IMPRESSION: 1. Patchy bilateral  lower lobe consolidation, not definitely changed from 08/30/2015, which could represent persistence of a prior pneumonia and/or superimposed new pneumonia. Recommend follow-up PA and lateral post treatment chest radiographs in 6-8 weeks. If the lower lobe opacities persist on follow-up chest radiographs, a chest CT would then be recommended for further evaluation. 2. Stable cardiomegaly without pulmonary edema. Electronically Signed   By: Ilona Sorrel M.D.   On: 09/16/2015 16:29   Dg Chest 2 View  08/30/2015  CLINICAL DATA:  Cough with epistaxis EXAM: CHEST  2 VIEW COMPARISON:  August 06, 2011 FINDINGS: There is no appreciable edema or consolidation. Heart is  mildly enlarged with pulmonary vascularity within normal limits. No adenopathy. Bones are osteoporotic. There is degenerative change in thoracic spine. No apparent pneumothorax. IMPRESSION: Mild cardiac enlargement.  No edema or consolidation. Electronically Signed   By: Lowella Grip III M.D.   On: 08/30/2015 10:58   Dg Elbow 2 Views Left  09/02/2015  CLINICAL DATA:  Left shoulder pain for 1 week, worsening left elbow pain today EXAM: LEFT ELBOW - 2 VIEW COMPARISON:  None. FINDINGS: Two views of the left elbow submitted. No acute fracture or subluxation. No posterior fat pad sign. There is mild spurring of radial head. Minimal spurring of humeral epicondyles. IMPRESSION: No acute fracture or subluxation. Mild degenerative changes. No posterior fat pad sign. Electronically Signed   By: Lahoma Crocker M.D.   On: 09/02/2015 15:22   Ct Head Wo Contrast  09/16/2015  CLINICAL DATA:  Seizures EXAM: CT HEAD WITHOUT CONTRAST TECHNIQUE: Contiguous axial images were obtained from the base of the skull through the vertex without intravenous contrast. COMPARISON:  August 30, 2015 FINDINGS: Mild diffuse atrophy is stable. There is a stable left-sided posterior fossa extra-axial arachnoid cyst measuring 3.3 x 1.8 cm, stable. There is no evidence of intra-axial  mass. There is no hemorrhage, subdural or epidural fluid collection, or midline shift. There is patchy small vessel disease throughout the centra semiovale bilaterally. There is a prior small lacunar infarct in the posterior limb of the right external capsule. No new gray-white compartment lesions are identified. No acute infarct is evident. The bony calvarium appears intact. The mastoid air cells are clear. There are no intraorbital lesions apparent. There is a small retention cyst in the posterior left maxillary antrum. There is mild mucosal thickening in the inferior right maxillary antrum. There is polyposis in the anterior right naris, stable. There is leftward deviation of the nasal septum. IMPRESSION: Atrophy with supratentorial small vessel disease, stable. Prior small lacunar infarct in the right external capsule, stable. No acute infarct evident. No hemorrhage. No intra-axial mass. Stable posterior fossa extra-axial cyst on the left. Areas of paranasal sinus disease as well as anterior right nasal polyposis. Electronically Signed   By: Lowella Grip III M.D.   On: 09/16/2015 16:22   Ct Head Wo Contrast  08/30/2015  CLINICAL DATA:  General body weakness, loss of appetite for 2-3 weeks. Headache. Elevated INR. EXAM: CT HEAD WITHOUT CONTRAST TECHNIQUE: Contiguous axial images were obtained from the base of the skull through the vertex without intravenous contrast. COMPARISON:  08/10/2004 FINDINGS: There is atrophy and chronic small vessel disease changes. No acute intracranial abnormality. Specifically, no hemorrhage, hydrocephalus, mass lesion, acute infarction, or significant intracranial injury. No acute calvarial abnormality. Visualized paranasal sinuses and mastoids clear. Orbital soft tissues unremarkable. IMPRESSION: No acute intracranial abnormality. Atrophy, chronic microvascular disease. Electronically Signed   By: Rolm Baptise M.D.   On: 08/30/2015 14:12   Ct Angio Chest Pe W/cm &/or Wo  Cm  08/30/2015  CLINICAL DATA:  Coughing up blood x day or two. Sob on exertion for a while per pt. EXAM: CT ANGIOGRAPHY CHEST WITH CONTRAST TECHNIQUE: Multidetector CT imaging of the chest was performed using the standard protocol during bolus administration of intravenous contrast. Multiplanar CT image reconstructions and MIPs were obtained to evaluate the vascular anatomy. CONTRAST:  37mL OMNIPAQUE IOHEXOL 350 MG/ML SOLN COMPARISON:  06/16/2010 FINDINGS: Vascular: Right arm IV contrast injection. The SVC is patent. Right atrial enlargement. Mild biventricular enlargement. Dilated central pulmonary arteries. Satisfactory opacification of pulmonary arteries noted,  and there is no evidence of pulmonary emboli. Patent bilateral pulmonary veins. Left atrial enlargement. Scattered coronary calcifications. Adequate contrast opacification of the thoracic aorta with no evidence of dissection, aneurysm, or stenosis. There is classic 3-vessel brachiocephalic arch anatomy without proximal stenosis. Calcified plaque in the arch and descending thoracic aorta. Mediastinum/Lymph Nodes: No pericardial effusion. No hilar or mediastinal adenopathy. Lungs/Pleura: Dense airspace consolidation in basilar segments of the right lower lobe. Scattered airspace opacities in the posterior and medial basal segments left lower lobe, superior segment right lower lobe, and posterior right middle lobe. Scattered poorly marginated nodular sub solid opacities throughout the right upper lobe and lingula. These airspace opacities were not present on prior scan. No pleural effusion. Upper abdomen: Atheromatous aorta.  No acute abnormality. Musculoskeletal: Bridging osteophytes across multiple contiguous levels in the mid thoracic spine. Spondylitic changes in the lower thoracic spine. Degenerative changes in bilateral shoulders. Review of the MIP images confirms the above findings. IMPRESSION: 1. Negative for acute PE or thoracic aortic dissection.  2. Extensive airspace disease most marked in the right lower lobe, suspect infectious/inflammatory process. Consider follow-up to confirm appropriate resolution . 3. Four-chamber cardiac enlargement with dilated central pulmonary arteries suggesting pulmonary hypertension. 4. Atherosclerosis, including aortic and coronary artery disease. Please note that although the presence of coronary artery calcium documents the presence of coronary artery disease, the severity of this disease and any potential stenosis cannot be assessed on this non-gated CT examination. Assessment for potential risk factor modification, dietary therapy or pharmacologic therapy may be warranted, if clinically indicated. Electronically Signed   By: Lucrezia Europe M.D.   On: 08/30/2015 12:54   Dg Shoulder Left  08/30/2015  CLINICAL DATA:  nosebleed onset yesterday and spitting blood. Pt also c/o generalized weakness and decrease appetite x 3 weeks. left shoulder pain worsening x 2 weeks. Denies injury. Pt is unable to move her left arm at all without sharp pain, scapula was tender during imaging as well. Unable to raise arm for lateral imaging. Lateral chest xray attempted with arm down to capture shoulder injury as well. EXAM: LEFT SHOULDER - 2+ VIEW COMPARISON:  None. FINDINGS: There is no evidence of fracture or dislocation. There is no evidence of arthropathy or other focal bone abnormality. Spurring in the visualized mid thoracic spine. Atheromatous aortic arch. Patchy axillary artery calcifications. Soft tissues are unremarkable. IMPRESSION: Negative. Electronically Signed   By: Lucrezia Europe M.D.   On: 08/30/2015 10:58     CBC  Recent Labs Lab 09/16/15 1537 09/16/15 1829  WBC 9.6  --   HGB 11.6* 11.6*  HCT 38.3 34.0*  PLT 204  --   MCV 88.9  --   MCH 26.9  --   MCHC 30.3  --   RDW 15.9*  --   LYMPHSABS 0.6*  --   MONOABS 0.8  --   EOSABS 0.1  --   BASOSABS 0.0  --     Chemistries   Recent Labs Lab 09/16/15 1537  09/16/15 1829  NA 137 135  K 6.5* 6.2*  CL 99* 104  CO2 18*  --   GLUCOSE 214* 154*  BUN 29* 32*  CREATININE 3.87* 3.40*  CALCIUM 10.5*  --   AST 29  --   ALT 12*  --   ALKPHOS 97  --   BILITOT 0.8  --    ------------------------------------------------------------------------------------------------------------------ CrCl cannot be calculated (Unknown ideal weight.). ------------------------------------------------------------------------------------------------------------------ No results for input(s): HGBA1C in the last 72 hours. ------------------------------------------------------------------------------------------------------------------ No results  for input(s): CHOL, HDL, LDLCALC, TRIG, CHOLHDL, LDLDIRECT in the last 72 hours. ------------------------------------------------------------------------------------------------------------------ No results for input(s): TSH, T4TOTAL, T3FREE, THYROIDAB in the last 72 hours.  Invalid input(s): FREET3 ------------------------------------------------------------------------------------------------------------------ No results for input(s): VITAMINB12, FOLATE, FERRITIN, TIBC, IRON, RETICCTPCT in the last 72 hours.  Coagulation profile  Recent Labs Lab 09/16/15 1537  INR 2.21*    No results for input(s): DDIMER in the last 72 hours.  Cardiac Enzymes No results for input(s): CKMB, TROPONINI, MYOGLOBIN in the last 168 hours.  Invalid input(s): CK ------------------------------------------------------------------------------------------------------------------ Invalid input(s): POCBNP   CBG: No results for input(s): GLUCAP in the last 168 hours.     EKG: Independently reviewed. Atrial fibrillation with a left anterior fascicular block. Tracing suggestive of LVH, but similar to previous   Assessment/Plan Possible seizures: Acute. Patient reported to have 2 episodes of seizure like activity. No previous history of  seizure-like activity in the past patient was noted to have loss of bowel, but this is not necessarily unusual for the patient to do. Consult neurology in the ED. - Admit to telemetry bed - seizure precautions - Consulted neurology for further evaluation  - Neurology recommended checking a MRI and EEG   - No AED recommend at this time   Lactic acidosis: Acute. Initial lactic acid level elevated at 2.63. Given patient's recent history of diarrhea I suspect that this could be related. - Trend lactic acid - Follow-up stool cultures   Hyperkalemia: Acute. Patient's initial potassium was noted to be 6.5 on admission. EKG showed no acute changes. Recheck a few hours later was noted to be 6.3. - Discontinue potassium supplementation - Continue IV fluids for now and reevaluate in am  Elevated BNP: Acutely elevated at 555 on admission. Patient denies any shortness of breath, but does complain of increased lower extremity swelling.  - +/- Lasix at this time  Acute renal failure superimposed on chronic kidney disease stage III: Patient's patient's baseline creatinine is between 1.04 - 1.2 per review of last hospital records. - Check FeUr - renal u/s  - Recheck BMP in a.m.  Diarrhea: Acute. Patient has a previous history of short gut syndrome questionable to restart antibiotics if he could have C. difficile. - Check stool panel  Atrial fibrillation with Chronic anticoagulation therapy - Warfarin by pharmacy -  Continue metoprolol and verapamil for home dose   Hyperlipidemia -Continue atorvastatin   Code Status:   full Family Communication: bedside Disposition Plan: admit   Total time spent 55 minutes.Greater than 50% of this time was spent in counseling, explanation of diagnosis, planning of further management, and coordination of care  [facility] Pager [phone]  If 7PM-7AM, please contact night-coverage [email or web address] Password [facility] [place] 09/16/2015, 7:40  PM

## 2015-09-16 NOTE — Consult Note (Signed)
NEURO HOSPITALIST CONSULT NOTE      Reason for Consult: possible seizure episode x 2   History obtained from:   Patient and Chart     HPI:                                                                                                                                          [name] is an 82 y.o. female with extensive medical hx as below presents from rehab with seizure like episode x 2. Daughter witnessed both episodes in which she states the first one she had just vomited and then fell backwards on the bed, eyes open and flapping her arms everywhere, lasted less then a minute and then was confused afterwards. Second episode happened while she was laying down several minutes later and she was more confused afterwards then the first one. No tongue biting or loss of urine but did lose control of her bowels the second time. Never had these types of episodes before. CTH showed no acute abnormality. Had a recent admission for INR > 10 but currently WNL.   Past Medical History  Diagnosis Date  . Anemia   . DVT (deep venous thrombosis) ([place]) 11/06  . Pulmonary embolism ([place]) 11/06  . Colon cancer ([place]) 12/11  . Hypertension   . Atrial fibrillation ([place])   . Frequent headaches   . Heart murmur   . Stroke ([place]) 11/30/2014  . Colon cancer ([place]) 2011    Past Surgical History  Procedure Laterality Date  . Appendectomy  1952  . Total hip arthroplasty  01/03/07    Left hip replacement.  . Colectomy  06/2010    partial, Dr. Donne Hazel complicated by LLE CVT; S/P IVC umbrella & anemia  . Total abdominal hysterectomy w/ bilateral salpingoophorectomy  1973    For Fibroids  . Vena cava filter placement  06/28/2010  . Hip fracture surgery  2006    Trauma    Family History  Problem Relation Age of Onset  . Peripheral vascular disease Father   . Heart attack Mother 24  . Heart disease Mother     MI  . Coronary artery disease Sister   . Diabetes Paternal Grandmother    . Coronary artery disease Maternal Grandfather     Family History: Mother HTN Father HTN  Social History:  reports that she has never smoked. She does not have any smokeless tobacco history on file. She reports that she does not drink alcohol or use illicit drugs.  No Known Allergies  MEDICATIONS:  I have reviewed the patient's current medications.   ROS:                                                                                                                                       History obtained from the patient  General ROS: negative for - chills, fatigue, fever, night sweats, weight gain or weight loss Psychological ROS: negative for - behavioral disorder, hallucinations, memory difficulties, mood swings or suicidal ideation Ophthalmic ROS: negative for - blurry vision, double vision, eye pain or loss of vision ENT ROS: negative for - epistaxis, nasal discharge, oral lesions, sore throat, tinnitus or vertigo Allergy and Immunology ROS: negative for - hives or itchy/watery eyes Hematological and Lymphatic ROS: negative for - bleeding problems, bruising or swollen lymph nodes Endocrine ROS: negative for - galactorrhea, hair pattern changes, polydipsia/polyuria or temperature intolerance Respiratory ROS: negative for - cough, hemoptysis, shortness of breath or wheezing Cardiovascular ROS: negative for - chest pain, dyspnea on exertion, edema or irregular heartbeat Gastrointestinal ROS: negative for - abdominal pain, diarrhea, hematemesis, nausea/vomiting or stool incontinence Genito-Urinary ROS: negative for - dysuria, hematuria, incontinence or urinary frequency/urgency Musculoskeletal ROS: negative for - joint swelling or muscular weakness Neurological ROS: as noted in HPI Dermatological ROS: negative for rash and skin lesion changes   Blood pressure 108/56,  pulse 72, temperature 97.7 F (36.5 C), temperature source Oral, resp. rate 17, SpO2 93 %.   Neurologic Examination:                                                                                                      HEENT-  Normocephalic, no lesions, without obvious abnormality.  Normal external eye and conjunctiva.  Normal TM's bilaterally.  Normal auditory canals and external ears. Normal external nose, mucus membranes and septum.  Normal pharynx. Cardiovascular- irregularly irregular, pulses palpable throughout   Lungs- chest clear, no wheezing, rales, normal symmetric air entry, Heart exam - S1, S2 normal, no murmur, no gallop, rate regular Abdomen- soft, non-tender; bowel sounds normal; no masses,  no organomegaly Extremities- no edema Musculoskeletal-no joint tenderness, deformity or swelling Skin-warm and dry, no hyperpigmentation, vitiligo, or suspicious lesions  Neurological Examination Mental Status: Alert, oriented, thought content appropriate.  Speech fluent without evidence of aphasia.  Able to follow 2 step commands without difficulty. Cranial Nerves: II: Discs flat bilaterally; Visual fields grossly normal, pupils equal, round, reactive to light  III,IV, VI: ptosis not present, extra-ocular motions intact bilaterally V,VII: smile symmetric, facial  light touch sensation normal bilaterally VIII: hearing normal bilaterally IX,X: uvula rises symmetrically XI: bilateral shoulder shrug XII: midline tongue extension Motor: Right : Upper extremity   5/5    Left:     Upper extremity   5/5  Lower extremity   5/5     Lower extremity   5/5 Tone and bulk:normal tone throughout; no atrophy noted Sensory: Pinprick and light touch intact throughout, bilaterally Plantars: Right: downgoing   Left: downgoing Cerebellar: normal finger-to-nose, normal rapid alternating movements and normal heel-to-shin test Gait: not tested       Lab Results: Basic Metabolic Panel:  Recent  Labs Lab 09/16/15 1537 09/16/15 1829  NA 137 135  K 6.5* 6.2*  CL 99* 104  CO2 18*  --   GLUCOSE 214* 154*  BUN 29* 32*  CREATININE 3.87* 3.40*  CALCIUM 10.5*  --     Liver Function Tests:  Recent Labs Lab 09/16/15 1537  AST 29  ALT 12*  ALKPHOS 97  BILITOT 0.8  PROT 6.7  ALBUMIN 3.0*    Recent Labs Lab 09/16/15 1537  LIPASE 22   No results for input(s): AMMONIA in the last 168 hours.  CBC:  Recent Labs Lab 09/16/15 1537 09/16/15 1829  WBC 9.6  --   NEUTROABS 8.2*  --   HGB 11.6* 11.6*  HCT 38.3 34.0*  MCV 88.9  --   PLT 204  --     Cardiac Enzymes: No results for input(s): CKTOTAL, CKMB, CKMBINDEX, TROPONINI in the last 168 hours.  Lipid Panel: No results for input(s): CHOL, TRIG, HDL, CHOLHDL, VLDL, LDLCALC in the last 168 hours.  CBG: No results for input(s): GLUCAP in the last 168 hours.  Microbiology: Results for orders placed or performed during the hospital encounter of 08/30/15  Blood culture (routine x 2)     Status: None   Collection Time: 08/30/15  3:11 PM  Result Value Ref Range Status   Specimen Description BLOOD RIGHT FOREARM  Final   Special Requests BOTTLES DRAWN AEROBIC AND ANAEROBIC 5CC  Final   Culture NO GROWTH 5 DAYS  Final   Report Status 09/04/2015 FINAL  Final  Blood culture (routine x 2)     Status: None   Collection Time: 08/30/15  3:24 PM  Result Value Ref Range Status   Specimen Description BLOOD RIGHT HAND  Final   Special Requests IN PEDIATRIC BOTTLE 3CC  Final   Culture NO GROWTH 5 DAYS  Final   Report Status 09/04/2015 FINAL  Final    Coagulation Studies:  Recent Labs  09/16/15 1537  LABPROT 24.3*  INR 2.21*    Imaging: Dg Chest 2 View  09/16/2015  CLINICAL DATA:  Seizures.  Clinical concern for pneumonia. EXAM: CHEST  2 VIEW COMPARISON:  08/30/2015 chest radiograph. FINDINGS: Stable cardiomediastinal silhouette with mild cardiomegaly. No pneumothorax. No pleural effusion. Patchy consolidation in the  lower lobes is not definitely changed from 08/30/2015. No pulmonary edema. No additional sites of consolidation. IMPRESSION: 1. Patchy bilateral lower lobe consolidation, not definitely changed from 08/30/2015, which could represent persistence of a prior pneumonia and/or superimposed new pneumonia. Recommend follow-up PA and lateral post treatment chest radiographs in 6-8 weeks. If the lower lobe opacities persist on follow-up chest radiographs, a chest CT would then be recommended for further evaluation. 2. Stable cardiomegaly without pulmonary edema. Electronically Signed   By: Ilona Sorrel M.D.   On: 09/16/2015 16:29   Ct Head Wo Contrast  09/16/2015  CLINICAL DATA:  Seizures EXAM: CT HEAD WITHOUT CONTRAST TECHNIQUE: Contiguous axial images were obtained from the base of the skull through the vertex without intravenous contrast. COMPARISON:  August 30, 2015 FINDINGS: Mild diffuse atrophy is stable. There is a stable left-sided posterior fossa extra-axial arachnoid cyst measuring 3.3 x 1.8 cm, stable. There is no evidence of intra-axial mass. There is no hemorrhage, subdural or epidural fluid collection, or midline shift. There is patchy small vessel disease throughout the centra semiovale bilaterally. There is a prior small lacunar infarct in the posterior limb of the right external capsule. No new gray-white compartment lesions are identified. No acute infarct is evident. The bony calvarium appears intact. The mastoid air cells are clear. There are no intraorbital lesions apparent. There is a small retention cyst in the posterior left maxillary antrum. There is mild mucosal thickening in the inferior right maxillary antrum. There is polyposis in the anterior right naris, stable. There is leftward deviation of the nasal septum. IMPRESSION: Atrophy with supratentorial small vessel disease, stable. Prior small lacunar infarct in the right external capsule, stable. No acute infarct evident. No hemorrhage. No  intra-axial mass. Stable posterior fossa extra-axial cyst on the left. Areas of paranasal sinus disease as well as anterior right nasal polyposis. Electronically Signed   By: Lowella Grip III M.D.   On: 09/16/2015 16:22     Assessment/Plan:  Seizure vs syncope  Recommendations - MRI brain with and without contrast - Routine EEG - No AED's at this time until workup is complete  - Elevated Lactic acid, can be seen post seizure or during infectious process - Infectious workup, CXR, U/A - Metabolic workup, Hyperkalemia, Hyperglycemia, AKI, which can all lower seizure threshold

## 2015-09-16 NOTE — Progress Notes (Signed)
Patient ID: [name], female   DOB: 03-31-1927, 82 y.o.   MRN: [id number]  MRN: [id number] Name: [name]  Sex: female Age: 82 y.o. DOB: 07/06/1927  [name] Facility/Room: 110 Level Of Care: SNF Provider: Wille Celeste Emergency Contacts: Extended Emergency Contact Information Primary Emergency Contact: [name],[name] Address: [address]          [place], [place] [address] [name] of [place] Phone: [phone] Mobile Phone: [phone] Relation: Daughter  Code Status:   Allergies: Review of patient's allergies indicates no known allergies.  Chief Complaint  Patient presents with  . Acute Visit   secondary to weakness-diarrhea-syncope-question seizure activity  HPI: Patient is 82 y.o. female the recent history of coagulopathy on chronic Coumadin with an elevated INR-also history of pneumonia requiring hospitalization-atrial fibrillation with slow ventricular response as well as history CVA and short bowel syndrome with apparently some element of chronic diarrhea.  She also has history of chronic renal insufficiency and chronic lower extremity edema. Creatinine had risen it appeared to about 1.6 previously but came down to 1.33 creatinine on lab done March 10   She has had some chronic lower extremity edema-and Dr. [name] did increase her Lasix to daily dosing on Friday.  Over the weekend patient apparently has developed some increased diarrhea-she does have chronic diarrhea with a history of short bowel syndrome but apparently this increased over the weekend.  Her blood pressure also is running somewhat low today at 90/60.  She has had some dizziness.  Patient also had an episode of vomiting this afternoon.  Patient says she feels weak but after vomiting said initially that she felt better.  We did discuss with the diarrhea weakness and hypotension being evaluated in the hospital-initially patient was resistant to this saying that  going in an ambulance made her feel dizzy and got her to feel somewhat sicker-.  However shortly thereafter patient became increasingly weak after going to the bathroom appear to have a syncopal episode where she became unresponsive for a very short period of time and then appeared to be having a somewhat like seizure activity.  Patient's heart rate was quite irregular and somewhat bradycardic initially.  Blood pressure also again was significantly low blood sugar was 213 she is not a diabetic.       Past Medical History  Diagnosis Date  . Anemia   . DVT (deep venous thrombosis) ([place]) 11/06  . Pulmonary embolism ([place]) 11/06  . Colon cancer ([place]) 12/11  . Hypertension   . Atrial fibrillation ([place])   . Frequent headaches   . Heart murmur   . Stroke ([place]) 11/30/2014  . Colon cancer ([place]) 2011  . Acute renal failure superimposed on stage 3 chronic kidney disease ([place])   . Seizures ([place])   . Hyperkalemia   . Elevated brain natriuretic peptide (BNP) level   . Diarrhea   . Acute encephalopathy     Past Surgical History  Procedure Laterality Date  . Appendectomy  1952  . Total hip arthroplasty  01/03/07    Left hip replacement.  . Colectomy  06/2010    partial, Dr. Donne Hazel complicated by LLE CVT; S/P IVC umbrella & anemia  . Total abdominal hysterectomy w/ bilateral salpingoophorectomy  1973    For Fibroids  . Vena cava filter placement  06/28/2010  . Hip fracture surgery  2006    Trauma        Immunization History  Administered Date(s) Administered  .  Influenza Split 03/30/2012  . Influenza Whole 04/30/2006, 04/20/2008, 04/15/2009  . Influenza, High Dose Seasonal PF 04/20/2013, 03/27/2015  . Influenza,inj,Quad PF,36+ Mos 05/23/2014  . Pneumococcal Conjugate-13 04/24/2015  . Pneumococcal Polysaccharide-23 07/06/2009    Social History  Substance Use Topics  . Smoking status: Never Smoker   . Smokeless tobacco: Not on file  . Alcohol Use: No    Review of  Systems  DATA OBTAINED: Essentially cannot obtain secondary to patient's status-again earlier  as noted in history of present illness recently patient did not complain of any acute chest pain or shortness of breath  says she essentially felt weak however but felt better after vomiting there has been some history of increased diarrhea this weekend  Filed Vitals:   09/16/15 2112  BP: 90/60  Pulse: 88    Physical Exam  GENERAL APPEARANCE: Initially patient was alert and conversant somewhat weak appearing but in no distress-later again appeared to have s distress as noted above SKIN: No diaphoresis rash, or wounds HEENT: Unremarkable RESPIRATORY: No labored breathing initially-I would say somewhat labored after the episode   CARDIOVASCULAR: Variable exam initially was regular rate and rhythm-after syncopal episode appear to be somewhat irregular and bradycardic GASTROINTESTINAL: Abdomen is soft, non-tender, not distended w/ normal bowel sounds.  GENITOURINARY: Bladder non tender, not distended  MUSCULOSKELETAL: Initially able to move all her extremities 4 did ambulate to the bathroom with assistance this afternoon--I do not really note any focal weaknesses after syncopal episode although this was somewhat difficult to assess secondary to patient's status NEUROLOGIC: Again cannot really appreciate any focal deficits as noted above PSYCHIATRIC: Initially pleasant and appropriate again after syncopal episode was not really talking  Patient Active Problem List   Diagnosis Date Noted  . Altered mental status   . Elevated brain natriuretic peptide (BNP) level 09/17/2015  . Acute renal failure superimposed on stage 3 chronic kidney disease ([place]) 09/17/2015  . Diarrhea 09/17/2015  . Acute encephalopathy   . Hyperkalemia 09/16/2015  . Seizure ([place]) 09/16/2015  . Seizures ([place]) 09/16/2015  . Nausea with vomiting 09/16/2015  . Syncope 09/16/2015  . Renal insufficiency 09/12/2015  . Epistaxis  09/07/2015  . Supratherapeutic INR 08/30/2015  . CAP (community acquired pneumonia) 08/30/2015  . Asthma with exacerbation 08/14/2015  . PCP NOTES >>>>> 04/24/2015  . Hyperlipidemia 12/31/2014  . TIA (transient ischemic attack) 12/11/2014  . Dyslipidemia 12/11/2014  . History of CVA (cerebrovascular accident) 12/01/2014  . BP (high blood pressure) 12/01/2014  . Infection of urinary tract 12/01/2014  . Change in blood platelet count 12/01/2014  . Chronic renal insufficiency, stage II (mild) 11/12/2013  . Impingement syndrome, shoulder, left 11/09/2013  . Acquired short bowel syndrome 11/09/2013  . Encounter for therapeutic drug monitoring 08/30/2013  . Hx pulmonary embolism 06/27/2013  . Warfarin anticoagulation 06/27/2013  . DDD (degenerative disc disease), lumbar 11/18/2012  . Mitral regurgitation 08/26/2011  . DYSPHAGIA UNSPECIFIED 07/31/2010  . ATRIAL FIBRILLATION WITH SLOW VENTRICULAR RESPONSE 07/08/2010  . COLON CANCER, HX OF 07/08/2010  . DYSPNEA/SHORTNESS OF BREATH 06/11/2010  . DIZZINESS 10/18/2009  . HYPOTENSION, ORTHOSTATIC 09/20/2009  . MUSCLE PAIN 08/23/2008  . Essential hypertension 12/16/2007  . EDEMA- LOCALIZED 12/16/2007  . HIP PAIN, CHRONIC 12/28/2006  . ANEMIA-NOS 07/15/2006  . Personal history of venous thrombosis and embolism 07/15/2006   09/13/2015.  WBC 6.2 hemoglobin 10.7 platelets 184.  Sodium 141 potassium 4.6 BUN 13 creatinine 1.33 09/05/2015.  WBC 11.0 hemoglobin 10.7 platelets 223.  09/03/2015.  Sodium 136 potassium  4.7 BUN 24 creatinine 1.64     Assessment and Plan   History of syncopal episode-question seizure activity-acute change in status-we will sent to the ER for emergent evaluation-before EMS arrival patient did remain conscious perserial exams blood pressure and pulse did improve somewhat but again has had a significant change in status from even earlier this afternoon-  [id number]      ,

## 2015-09-16 NOTE — ED Notes (Signed)
Pt had two seizures lasting less than minute with no hx. Full body shaking, pt unresponsive during siezure. Pt alert and oriented for EMS. Pt CBG 270 with no hx diabetes. BP 122/52, HR 68, EMS gave 8mg  zofran for nausea. Pt took phenergen at 1300 and pt has fentanyl patch.

## 2015-09-17 ENCOUNTER — Inpatient Hospital Stay (HOSPITAL_COMMUNITY): Payer: Medicare Other

## 2015-09-17 DIAGNOSIS — R7989 Other specified abnormal findings of blood chemistry: Secondary | ICD-10-CM | POA: Diagnosis present

## 2015-09-17 DIAGNOSIS — R4182 Altered mental status, unspecified: Secondary | ICD-10-CM

## 2015-09-17 DIAGNOSIS — R197 Diarrhea, unspecified: Secondary | ICD-10-CM | POA: Diagnosis present

## 2015-09-17 DIAGNOSIS — G9340 Encephalopathy, unspecified: Secondary | ICD-10-CM | POA: Insufficient documentation

## 2015-09-17 DIAGNOSIS — R609 Edema, unspecified: Secondary | ICD-10-CM

## 2015-09-17 DIAGNOSIS — I482 Chronic atrial fibrillation: Secondary | ICD-10-CM

## 2015-09-17 DIAGNOSIS — Z7901 Long term (current) use of anticoagulants: Secondary | ICD-10-CM

## 2015-09-17 LAB — BASIC METABOLIC PANEL
ANION GAP: 8 (ref 5–15)
ANION GAP: 9 (ref 5–15)
Anion gap: 9 (ref 5–15)
Anion gap: 8 (ref 5–15)
BUN: 28 mg/dL — AB (ref 6–20)
BUN: 28 mg/dL — ABNORMAL HIGH (ref 6–20)
BUN: 26 mg/dL — ABNORMAL HIGH (ref 6–20)
BUN: 26 mg/dL — ABNORMAL HIGH (ref 6–20)
CALCIUM: 8.4 mg/dL — AB (ref 8.9–10.3)
CALCIUM: 8.7 mg/dL — AB (ref 8.9–10.3)
CALCIUM: 8.3 mg/dL — AB (ref 8.9–10.3)
CHLORIDE: 109 mmol/L (ref 101–111)
CHLORIDE: 108 mmol/L (ref 101–111)
CHLORIDE: 109 mmol/L (ref 101–111)
CO2: 22 mmol/L (ref 22–32)
CO2: 20 mmol/L — ABNORMAL LOW (ref 22–32)
CO2: 23 mmol/L (ref 22–32)
CO2: 23 mmol/L (ref 22–32)
CREATININE: 3.57 mg/dL — AB (ref 0.44–1.00)
CREATININE: 3.55 mg/dL — AB (ref 0.44–1.00)
CREATININE: 3.35 mg/dL — AB (ref 0.44–1.00)
CREATININE: 3.19 mg/dL — AB (ref 0.44–1.00)
Calcium: 8.6 mg/dL — ABNORMAL LOW (ref 8.9–10.3)
Chloride: 108 mmol/L (ref 101–111)
GFR calc Af Amer: 12 mL/min — ABNORMAL LOW (ref >60)
GFR calc non Af Amer: 11 mL/min — ABNORMAL LOW (ref >60)
GFR calc non Af Amer: 11 mL/min — ABNORMAL LOW (ref >60)
GFR calc non Af Amer: 12 mL/min — ABNORMAL LOW (ref >60)
GFR, EST AFRICAN AMERICAN: 12 mL/min — AB (ref >60)
GFR, EST AFRICAN AMERICAN: 13 mL/min — AB (ref >60)
GFR, EST AFRICAN AMERICAN: 14 mL/min — AB (ref >60)
GFR, EST NON AFRICAN AMERICAN: 11 mL/min — AB (ref >60)
GLUCOSE: 97 mg/dL (ref 65–99)
Glucose, Bld: 88 mg/dL (ref 65–99)
Glucose, Bld: 100 mg/dL — ABNORMAL HIGH (ref 65–99)
Glucose, Bld: 113 mg/dL — ABNORMAL HIGH (ref 65–99)
Potassium: 6.4 mmol/L (ref 3.5–5.1)
Potassium: 5.9 mmol/L — ABNORMAL HIGH (ref 3.5–5.1)
Potassium: 5.7 mmol/L — ABNORMAL HIGH (ref 3.5–5.1)
Potassium: 5.8 mmol/L — ABNORMAL HIGH (ref 3.5–5.1)
SODIUM: 138 mmol/L (ref 135–145)
SODIUM: 140 mmol/L (ref 135–145)
SODIUM: 140 mmol/L (ref 135–145)
Sodium: 138 mmol/L (ref 135–145)

## 2015-09-17 LAB — LACTIC ACID, PLASMA
LACTIC ACID, VENOUS: 0.9 mmol/L (ref 0.5–2.0)
Lactic Acid, Venous: 0.8 mmol/L (ref 0.5–2.0)

## 2015-09-17 LAB — CBC WITH DIFFERENTIAL/PLATELET
BASOS ABS: 0 10*3/uL (ref 0.0–0.1)
Basophils Relative: 0 %
Eosinophils Absolute: 0 10*3/uL (ref 0.0–0.7)
Eosinophils Relative: 1 %
HEMATOCRIT: 31.3 % — AB (ref 36.0–46.0)
HEMOGLOBIN: 9.3 g/dL — AB (ref 12.0–15.0)
Lymphocytes Relative: 18 %
Lymphs Abs: 1 10*3/uL (ref 0.7–4.0)
MCH: 26.5 pg (ref 26.0–34.0)
MCHC: 29.7 g/dL — ABNORMAL LOW (ref 30.0–36.0)
MCV: 89.2 fL (ref 78.0–100.0)
MONO ABS: 0.5 10*3/uL (ref 0.1–1.0)
MONOS PCT: 9 %
NEUTROS ABS: 3.8 10*3/uL (ref 1.7–7.7)
Neutrophils Relative %: 72 %
PLATELETS: 169 10*3/uL (ref 150–400)
RBC: 3.51 MIL/uL — AB (ref 3.87–5.11)
RDW: 16.1 % — ABNORMAL HIGH (ref 11.5–15.5)
WBC: 5.3 10*3/uL (ref 4.0–10.5)

## 2015-09-17 LAB — MRSA PCR SCREENING: MRSA by PCR: NEGATIVE

## 2015-09-17 LAB — PROTIME-INR
INR: 2.45 — ABNORMAL HIGH (ref 0.00–1.49)
Prothrombin Time: 26.3 seconds — ABNORMAL HIGH (ref 11.6–15.2)

## 2015-09-17 LAB — CK: CK TOTAL: 12 U/L — AB (ref 38–234)

## 2015-09-17 LAB — CREATININE, URINE, RANDOM: Creatinine, Urine: 131.46 mg/dL

## 2015-09-17 MED ORDER — FUROSEMIDE 10 MG/ML IJ SOLN
20.0000 mg | Freq: Once | INTRAMUSCULAR | Status: AC
  Administered 2015-09-17: 20 mg via INTRAVENOUS
  Filled 2015-09-17: qty 2

## 2015-09-17 MED ORDER — FENTANYL 50 MCG/HR TD PT72
50.0000 ug | MEDICATED_PATCH | TRANSDERMAL | Status: DC
  Administered 2015-09-18: 50 ug via TRANSDERMAL
  Filled 2015-09-17: qty 1

## 2015-09-17 MED ORDER — INSULIN ASPART 100 UNIT/ML IV SOLN
10.0000 [IU] | Freq: Once | INTRAVENOUS | Status: AC
  Administered 2015-09-17: 10 [IU] via INTRAVENOUS

## 2015-09-17 MED ORDER — VANCOMYCIN 50 MG/ML ORAL SOLUTION
250.0000 mg | Freq: Four times a day (QID) | ORAL | Status: DC
  Filled 2015-09-17: qty 5

## 2015-09-17 MED ORDER — SODIUM CHLORIDE 0.9 % IV SOLN
INTRAVENOUS | Status: DC
  Administered 2015-09-17: 02:00:00 via INTRAVENOUS

## 2015-09-17 MED ORDER — WARFARIN SODIUM 2 MG PO TABS
2.0000 mg | ORAL_TABLET | Freq: Every day | ORAL | Status: DC
  Administered 2015-09-17 – 2015-09-19 (×4): 2 mg via ORAL
  Filled 2015-09-17 (×4): qty 1

## 2015-09-17 MED ORDER — SODIUM BICARBONATE 8.4 % IV SOLN
50.0000 meq | Freq: Once | INTRAVENOUS | Status: AC
  Administered 2015-09-17: 50 meq via INTRAVENOUS
  Filled 2015-09-17: qty 50

## 2015-09-17 MED ORDER — SODIUM CHLORIDE 0.9 % IV BOLUS (SEPSIS)
1000.0000 mL | Freq: Once | INTRAVENOUS | Status: AC
  Administered 2015-09-17: 1000 mL via INTRAVENOUS

## 2015-09-17 MED ORDER — FLUTICASONE FUROATE-VILANTEROL 100-25 MCG/INH IN AEPB
1.0000 | INHALATION_SPRAY | Freq: Every day | RESPIRATORY_TRACT | Status: DC
  Administered 2015-09-17 – 2015-09-20 (×4): 1 via RESPIRATORY_TRACT
  Filled 2015-09-17: qty 28

## 2015-09-17 MED ORDER — DEXTROSE 50 % IV SOLN
1.0000 | Freq: Once | INTRAVENOUS | Status: AC
  Administered 2015-09-17: 50 mL via INTRAVENOUS
  Filled 2015-09-17: qty 50

## 2015-09-17 MED ORDER — SODIUM CHLORIDE 0.9 % IV SOLN
INTRAVENOUS | Status: DC
  Administered 2015-09-18: 01:00:00 via INTRAVENOUS

## 2015-09-17 MED ORDER — BOOST / RESOURCE BREEZE PO LIQD
1.0000 | Freq: Three times a day (TID) | ORAL | Status: DC
  Administered 2015-09-17 – 2015-09-19 (×5): 1 via ORAL

## 2015-09-17 MED ORDER — METOPROLOL TARTRATE 12.5 MG HALF TABLET
12.5000 mg | ORAL_TABLET | Freq: Two times a day (BID) | ORAL | Status: DC
  Administered 2015-09-17 – 2015-09-19 (×5): 12.5 mg via ORAL
  Filled 2015-09-17 (×5): qty 1

## 2015-09-17 MED ORDER — VANCOMYCIN 50 MG/ML ORAL SOLUTION
125.0000 mg | Freq: Four times a day (QID) | ORAL | Status: DC
  Administered 2015-09-17 – 2015-09-18 (×5): 125 mg via ORAL
  Filled 2015-09-17 (×5): qty 2.5

## 2015-09-17 MED ORDER — SODIUM CHLORIDE 0.9 % IV SOLN
1.0000 g | Freq: Once | INTRAVENOUS | Status: AC
  Administered 2015-09-17: 1 g via INTRAVENOUS
  Filled 2015-09-17: qty 10

## 2015-09-17 MED ORDER — WARFARIN - PHARMACIST DOSING INPATIENT
Freq: Every day | Status: DC
  Administered 2015-09-17 – 2015-09-19 (×3)

## 2015-09-17 NOTE — Progress Notes (Signed)
PT Cancellation Note  Patient Details Name: MAGUADALUPE KASSON MRN: [id number] DOB: 1927/01/12   Cancelled Treatment:    Reason Eval/Treat Not Completed: Patient at procedure or test/unavailable. Spoke with RN. Pt getting ready to leave floor for EEG, followed by CT. PT to re-attempt eval tomorrow.   Lorriane Shire 09/17/2015, 10:12 AM

## 2015-09-17 NOTE — Clinical Social Work Note (Signed)
Clinical Social Work Assessment  Patient Details  Name: [name] MRN: [id number] Date of Birth: 03/05/1927  Date of referral:  09/17/15               Reason for consult:  Facility Placement                Permission sought to share information with:  Case Manager, Family Supports Permission granted to share information::  Yes, Verbal Permission Granted  Name::     [name]   Agency::     Relationship::  Daughter   Contact Information:  [phone]  Housing/Transportation Living arrangements for the past 2 months:  [place] of Information:  Patient, Adult Children Patient Interpreter Needed:  None Criminal Activity/Legal Involvement Pertinent to Current Situation/Hospitalization:  No - Comment as needed Significant Relationships:  Adult Children Lives with:  Facility Resident Do you feel safe going back to the place where you live?  Yes Need for family participation in patient care:  Yes (Comment)  Care giving concerns:  No concerns reported at this time.    Social Worker assessment / plan:  CSW went to speak with patient regarding discharge planning. CSW introduced self and acknowledged the patient. Patient is alert and oriented at time of assessment. Patient was calm and cooperative with CSW assessment. Patient's daughter [name] is at bedside. Patient provided CSW with permission to speak with daughter regarding disposition. Patient and daughter have requested for patient to return back to [facility]. CSW informed patient and family that [facility] will contact facility to explore any concerns regarding the patient returning. CSW will then fax over requested patient information. CSW to complete FL2 for MD signature.    Employment status:  Disabled (Comment on whether or not currently receiving Disability) Insurance information:  Medicare PT Recommendations:  [place] / Referral to community resources:      Patient/Family's Response to care:  Patient and daughter is agreeable with disposition plan.   Patient/Family's Understanding of and Emotional Response to Diagnosis, Current Treatment, and Prognosis:  Patient and daughter is aware and understanding of current treatment and prognosis.   Emotional Assessment Appearance:  Appears stated age Attitude/Demeanor/Rapport:   (Calm and Cooperative ) Affect (typically observed):  Accepting, Appropriate, Calm Orientation:  Oriented to Self, Oriented to Place Alcohol / Substance use:  Not Applicable Psych involvement (Current and /or in the community):  No (Comment)  Discharge Needs  Concerns to be addressed:  Discharge Planning Concerns Readmission within the last 30 days:  Yes Current discharge risk:  Physical Impairment Barriers to Discharge:  Continued Medical Work up   [employer], LCSW 09/17/2015, 3:33 PM

## 2015-09-17 NOTE — Progress Notes (Signed)
CRITICAL VALUE ALERT  Critical value received:  K=6.4  Date of notification: 09/17/15  Time of notification:  0606  Critical value read back: yes  Nurse who received alert:  G. January Bergthold  MD notified (1st page):  Tylene Fantasia NP  Time of first page:  0610  MD notified (2nd page):  Time of second page:  Responding MD:    Time MD responded:  Awaiting call back

## 2015-09-17 NOTE — NC FL2 (Signed)
[place] LEVEL OF CARE SCREENING TOOL     IDENTIFICATION  Patient Name: [name] Birthdate: [birth year] Sex: female Admission Date (Current Location): 09/16/2015  [facility] and [place] Number:  Herbalist and Address:  [facility], [place] [address], [place], [place] [address]      Provider Number: [id number]  Attending Physician Name and Address:  Florencia Reasons, MD  Relative Name and Phone Number:  [name], daughter, [phone]    Current Level of Care: Hospital Recommended Level of Care: [place] Prior Approval Number:    Date Approved/Denied:   PASRR Number: [id number]  Discharge Plan: SNF    Current Diagnoses: Patient Active Problem List   Diagnosis Date Noted  . Elevated brain natriuretic peptide (BNP) level 09/17/2015  . Acute renal failure superimposed on stage 3 chronic kidney disease ([place]) 09/17/2015  . Diarrhea 09/17/2015  . Hyperkalemia 09/16/2015  . Seizure ([place]) 09/16/2015  . Seizures ([place]) 09/16/2015  . Nausea with vomiting 09/16/2015  . Syncope 09/16/2015  . Renal insufficiency 09/12/2015  . Epistaxis 09/07/2015  . Supratherapeutic INR 08/30/2015  . CAP (community acquired pneumonia) 08/30/2015  . Asthma with exacerbation 08/14/2015  . PCP NOTES >>>>> 04/24/2015  . Hyperlipidemia 12/31/2014  . TIA (transient ischemic attack) 12/11/2014  . Dyslipidemia 12/11/2014  . History of CVA (cerebrovascular accident) 12/01/2014  . BP (high blood pressure) 12/01/2014  . Infection of urinary tract 12/01/2014  . Change in blood platelet count 12/01/2014  . Chronic renal insufficiency, stage II (mild) 11/12/2013  . Impingement syndrome, shoulder, left 11/09/2013  . Acquired short bowel syndrome 11/09/2013  . Encounter for therapeutic drug monitoring 08/30/2013  . Hx pulmonary embolism 06/27/2013  . Warfarin anticoagulation 06/27/2013  . DDD (degenerative disc disease), lumbar 11/18/2012  . Mitral  regurgitation 08/26/2011  . DYSPHAGIA UNSPECIFIED 07/31/2010  . ATRIAL FIBRILLATION WITH SLOW VENTRICULAR RESPONSE 07/08/2010  . COLON CANCER, HX OF 07/08/2010  . DYSPNEA/SHORTNESS OF BREATH 06/11/2010  . DIZZINESS 10/18/2009  . HYPOTENSION, ORTHOSTATIC 09/20/2009  . MUSCLE PAIN 08/23/2008  . Essential hypertension 12/16/2007  . EDEMA- LOCALIZED 12/16/2007  . HIP PAIN, CHRONIC 12/28/2006  . ANEMIA-NOS 07/15/2006  . Personal history of venous thrombosis and embolism 07/15/2006    Orientation RESPIRATION BLADDER Height & Weight     Self, Time, Situation, Place  Normal Incontinent Weight: 174 lb (78.926 kg) Height:  5\' 3"  (160 cm)  BEHAVIORAL SYMPTOMS/MOOD NEUROLOGICAL BOWEL NUTRITION STATUS      Continent Diet  AMBULATORY STATUS COMMUNICATION OF NEEDS Skin   Extensive Assist Verbally Normal                       Personal Care Assistance Level of Assistance  Bathing, Feeding, Dressing Bathing Assistance: Maximum assistance Feeding assistance: Independent Dressing Assistance: Maximum assistance     Functional Limitations Info  Sight, Hearing, Speech Sight Info: Adequate Hearing Info: Adequate Speech Info: Adequate    SPECIAL CARE FACTORS FREQUENCY  PT (By licensed PT)                    Contractures      Additional Factors Info  Code Status, Allergies Code Status Info: Full  Allergies Info: NKA           Current Medications (09/17/2015):  This is the current hospital active medication list Current Facility-Administered Medications  Medication Dose Route Frequency Provider Last Rate Last Dose  . 0.9 %  sodium chloride  infusion  250 mL Intravenous PRN Norval Morton, MD      . acetaminophen (TYLENOL) tablet 650 mg  650 mg Oral Q4H PRN Norval Morton, MD      . ALPRAZolam Duanne Moron) tablet 0.25 mg  0.25 mg Oral BID PRN Norval Morton, MD      . feeding supplement (BOOST / RESOURCE BREEZE) liquid 1 Container  1 Container Oral TID BM Norval Morton, MD    1 Container at 09/17/15 1000  . [START ON 09/18/2015] fentaNYL (DURAGESIC - dosed mcg/hr) 50 mcg  50 mcg Transdermal Q72H Florencia Reasons, MD      . fluticasone furoate-vilanterol (BREO ELLIPTA) 100-25 MCG/INH 1 puff  1 puff Inhalation Daily Norval Morton, MD   1 puff at 09/17/15 1001  . levalbuterol (XOPENEX) nebulizer solution 0.63 mg  0.63 mg Nebulization Q6H PRN Norval Morton, MD      . metoprolol tartrate (LOPRESSOR) tablet 12.5 mg  12.5 mg Oral BID Florencia Reasons, MD   12.5 mg at 09/17/15 0945  . ondansetron (ZOFRAN) injection 4 mg  4 mg Intravenous Q6H PRN Norval Morton, MD      . saccharomyces boulardii (FLORASTOR) capsule 250 mg  250 mg Oral BID Norval Morton, MD   250 mg at 09/17/15 0945  . sodium chloride flush (NS) 0.9 % injection 3 mL  3 mL Intravenous Q12H Rondell Charmayne Sheer, MD   3 mL at 09/17/15 1000  . sodium chloride flush (NS) 0.9 % injection 3 mL  3 mL Intravenous PRN Norval Morton, MD      . traMADol (ULTRAM) tablet 50 mg  50 mg Oral Q6H PRN Norval Morton, MD      . vancomycin (VANCOCIN) 50 mg/mL oral solution 125 mg  125 mg Oral 4 times per day Florencia Reasons, MD   125 mg at 09/17/15 0945  . warfarin (COUMADIN) tablet 2 mg  2 mg Oral q1800 Laren Everts, RPH   2 mg at 09/17/15 0210  . Warfarin - Pharmacist Dosing Inpatient   Does not apply [place], [facility]         Discharge Medications: Please see discharge summary for a list of discharge medications.  Relevant Imaging Results:  Relevant Lab Results:   Additional Information SSN: [id number]  [place], [place]

## 2015-09-17 NOTE — Procedures (Signed)
History: [name] is an 82 y.o. female patient with altered mental status and seizure like spells. Routine inpatient EEG was performed for further evaluation.   Patient Active Problem List   Diagnosis Date Noted  . Elevated brain natriuretic peptide (BNP) level 09/17/2015  . Acute renal failure superimposed on stage 3 chronic kidney disease ([place]) 09/17/2015  . Diarrhea 09/17/2015  . Hyperkalemia 09/16/2015  . Seizure ([place]) 09/16/2015  . Seizures ([place]) 09/16/2015  . Nausea with vomiting 09/16/2015  . Syncope 09/16/2015  . Renal insufficiency 09/12/2015  . Epistaxis 09/07/2015  . Supratherapeutic INR 08/30/2015  . CAP (community acquired pneumonia) 08/30/2015  . Asthma with exacerbation 08/14/2015  . PCP NOTES >>>>> 04/24/2015  . Hyperlipidemia 12/31/2014  . TIA (transient ischemic attack) 12/11/2014  . Dyslipidemia 12/11/2014  . History of CVA (cerebrovascular accident) 12/01/2014  . BP (high blood pressure) 12/01/2014  . Infection of urinary tract 12/01/2014  . Change in blood platelet count 12/01/2014  . Chronic renal insufficiency, stage II (mild) 11/12/2013  . Impingement syndrome, shoulder, left 11/09/2013  . Acquired short bowel syndrome 11/09/2013  . Encounter for therapeutic drug monitoring 08/30/2013  . Hx pulmonary embolism 06/27/2013  . Warfarin anticoagulation 06/27/2013  . DDD (degenerative disc disease), lumbar 11/18/2012  . Mitral regurgitation 08/26/2011  . DYSPHAGIA UNSPECIFIED 07/31/2010  . ATRIAL FIBRILLATION WITH SLOW VENTRICULAR RESPONSE 07/08/2010  . COLON CANCER, HX OF 07/08/2010  . DYSPNEA/SHORTNESS OF BREATH 06/11/2010  . DIZZINESS 10/18/2009  . HYPOTENSION, ORTHOSTATIC 09/20/2009  . MUSCLE PAIN 08/23/2008  . Essential hypertension 12/16/2007  . EDEMA- LOCALIZED 12/16/2007  . HIP PAIN, CHRONIC 12/28/2006  . ANEMIA-NOS 07/15/2006  . Personal history of venous thrombosis and embolism 07/15/2006     Current facility-administered medications:   .  0.9 %  sodium chloride infusion, 250 mL, Intravenous, PRN, Norval Morton, MD .  0.9 %  sodium chloride infusion, , Intravenous, Continuous, [name], MD, Last Rate: 100 mL/hr at 09/17/15 1245 .  acetaminophen (TYLENOL) tablet 650 mg, 650 mg, Oral, Q4H PRN, Norval Morton, MD .  ALPRAZolam Duanne Moron) tablet 0.25 mg, 0.25 mg, Oral, BID PRN, Norval Morton, MD .  feeding supplement (BOOST / RESOURCE BREEZE) liquid 1 Container, 1 Container, Oral, TID BM, Norval Morton, MD, 1 Container at 09/17/15 2227 .  [START ON 09/18/2015] fentaNYL (DURAGESIC - dosed mcg/hr) 50 mcg, 50 mcg, Transdermal, Q72H, Florencia Reasons, MD .  fluticasone furoate-vilanterol (BREO ELLIPTA) 100-25 MCG/INH 1 puff, 1 puff, Inhalation, Daily, Norval Morton, MD, 1 puff at 09/17/15 1001 .  levalbuterol (XOPENEX) nebulizer solution 0.63 mg, 0.63 mg, Nebulization, Q6H PRN, Norval Morton, MD .  metoprolol tartrate (LOPRESSOR) tablet 12.5 mg, 12.5 mg, Oral, BID, Florencia Reasons, MD, 12.5 mg at 09/17/15 2226 .  ondansetron (ZOFRAN) injection 4 mg, 4 mg, Intravenous, Q6H PRN, Norval Morton, MD .  saccharomyces boulardii (FLORASTOR) capsule 250 mg, 250 mg, Oral, BID, Norval Morton, MD, 250 mg at 09/17/15 2226 .  sodium chloride flush (NS) 0.9 % injection 3 mL, 3 mL, Intravenous, Q12H, Rondell A Smith, MD, 3 mL at 09/17/15 1000 .  sodium chloride flush (NS) 0.9 % injection 3 mL, 3 mL, Intravenous, PRN, Norval Morton, MD .  traMADol (ULTRAM) tablet 50 mg, 50 mg, Oral, Q6H PRN, Norval Morton, MD .  vancomycin (VANCOCIN) 50 mg/mL oral solution 125 mg, 125 mg, Oral, 4 times per day, Florencia Reasons, MD, 125 mg at 09/17/15 1706 .  warfarin (COUMADIN)  tablet 2 mg, 2 mg, Oral, q1800, Laren Everts, RPH, 2 mg at 09/17/15 1706 .  Warfarin - Pharmacist Dosing Inpatient, , Does not apply, q1800, Laren Everts, RPH   Introduction:  This is a 19 channel routine scalp EEG performed at the bedside with bipolar and monopolar montages arranged in  accordance to the international 10/20 system of electrode placement. One channel was dedicated to EKG recording.   Findings:  Mild generalized background slowing in the range of 7-8 Hz alpha noted . No definite evidence of abnormal epileptiform discharges or electrographic seizures were noted during this recording.   Impression:  Abnormal awake and drowsy routine inpatient EEG suggestive of mild encephalopathy as described.  Clinical correlation is recommended .

## 2015-09-17 NOTE — Evaluation (Addendum)
Clinical/Bedside Swallow Evaluation Patient Details  Name: [name] MRN: [id number] Date of Birth: 1927-06-16  Today's Date: 09/17/2015 Time: SLP Start Time (ACUTE ONLY): [id number] SLP Stop Time (ACUTE ONLY): 1448 SLP Time Calculation (min) (ACUTE ONLY): 15 min  Past Medical History:  Past Medical History  Diagnosis Date  . Anemia   . DVT (deep venous thrombosis) ([place]) 11/06  . Pulmonary embolism ([place]) 11/06  . Colon cancer ([place]) 12/11  . Hypertension   . Atrial fibrillation ([place])   . Frequent headaches   . Heart murmur   . Stroke ([place]) 11/30/2014  . Colon cancer ([place]) 2011   Past Surgical History:  Past Surgical History  Procedure Laterality Date  . Appendectomy  1952  . Total hip arthroplasty  01/03/07    Left hip replacement.  . Colectomy  06/2010    partial, Dr. Donne Hazel complicated by LLE CVT; S/P IVC umbrella & anemia  . Total abdominal hysterectomy w/ bilateral salpingoophorectomy  1973    For Fibroids  . Vena cava filter placement  06/28/2010  . Hip fracture surgery  2006    Trauma   HPI:  82 year old female with a past medical history significant for HTN, HLD, atrial fibrillation on anticoagulation, pneumonia 2/24 - 2/28, colon cancer, CVA (11/2014), DVT/PE; who presents after having 2 episodes of seizure like activity with unresponsiveness. CT of the brain which showed no acute signs of a bleed or infarct and stated chronic changes seen on previous CTs. Chest x-ray which was similar to previous showing patchy basilar opacities similar to previous imaging.    Assessment / Plan / Recommendation Clinical Impression  Subtle and inconsistent throat clears following thin liquid; question of esophageal etiology with reports of intermittent esophageal globus sensation. Mastication of solids functional. Pt appears significantly khypotic which may impact cervical spine and pharyngeal swallow slightly. Pt at increased risk, therefore if pt is admitted with pna in near future,  she may benefit from objective assessment at that time. SLP educated pt to alternate liquids and solids and remain upright 45 minutes after meals. No follow up needed.       Aspiration Risk   (mild-mod)    Diet Recommendation Regular;Thin liquid   Liquid Administration via: Cup;Straw Medication Administration: Whole meds with liquid Supervision: Patient able to self feed Compensations: Follow solids with liquid;Small sips/bites;Slow rate Postural Changes: Seated upright at 90 degrees;Remain upright for at least 30 minutes after po intake    Other  Recommendations Oral Care Recommendations: Oral care BID   Follow up Recommendations  None    Frequency and Duration            Prognosis        Swallow Study   General HPI: 82 year old female with a past medical history significant for HTN, HLD, atrial fibrillation on anticoagulation, pneumonia 2/24 - 2/28, colon cancer, CVA (11/2014), DVT/PE; who presents after having 2 episodes of seizure like activity with unresponsiveness. CT of the brain which showed no acute signs of a bleed or infarct and stated chronic changes seen on previous CTs. Chest x-ray which was similar to previous showing patchy basilar opacities similar to previous imaging.  Type of Study: Bedside Swallow Evaluation Previous Swallow Assessment:  (none) Diet Prior to this Study: Regular;Thin liquids Temperature Spikes Noted: No Respiratory Status: Nasal cannula History of Recent Intubation: No Behavior/Cognition: Alert;Cooperative;Pleasant mood Oral Cavity Assessment: Within Functional Limits Oral Care Completed by SLP: No Oral Cavity - Dentition: Poor condition;Missing dentition Vision: Functional for self-feeding Self-Feeding  Abilities: Able to feed self Patient Positioning: Upright in bed Baseline Vocal Quality: Normal Volitional Cough: Strong Volitional Swallow: Able to elicit    Oral/Motor/Sensory Function Overall Oral Motor/Sensory Function: Within  functional limits   Ice Chips Ice chips: Not tested   Thin Liquid Thin Liquid: Impaired Presentation: Cup;Straw Pharyngeal  Phase Impairments: Throat Clearing - Immediate    Nectar Thick Nectar Thick Liquid: Not tested   Honey Thick Honey Thick Liquid: Not tested   Puree Puree: Within functional limits   Solid   GO   Solid: Within functional limits        Houston Siren 09/17/2015,3:21 PM  Orbie Pyo Colvin Caroli.[employer] [phone]

## 2015-09-17 NOTE — Consult Note (Signed)
[facility] Admit Date: 09/16/2015 09/17/2015 [facility] Requesting Physician:  Megan Hong MD  Reason for Consult:  AKI, Hyperkalemia HPI:  35F seen at the request of Dr. Erlinda Hendricks for elevated serum creatinine.  Pertinent history includes history of PE on warfarin, atrial fibrillation, history of Megan cancer with short gut syndrome. Patient was recently admitted to [facility] from 2/24 through the 28th with supratherapeutic INR elated to warfarin and a community-acquired pneumonia for which she was treated with ceftriaxone and azithromycin followed by Augmentin. She has a history of colorectal surgery and short gut syndrome with chronic diarrhea. At the nursing facility where she was residing she had very poor oral intake and increased stool output/diarrhea. She had 2 seizure-like episodes which were witnessed and prompted evaluation in the emergency room.  She has some baseline mild chronic kidney disease but was found to have significant worsening upon presentation to the emergency room. She had a CT angiography on 2/24 but renal function stayed stable for the next 4 days. No nonsteroidal use. As mentioned above she has had poor intake and increased output from gastrointestinal sources.  Renal ultrasound obtained 3/13 demonstrates a 9.6 cm right kidney with cortical thinning present and a 9 cm left kidney which is poorly seen. She has a known renal cystic mass which is being followed as an outpatient.  She endorses feeling dizzy and woozy when sitting up or standing.  A Foley catheter has been placed with yellow urine in her collection bag. Urine analysis with no leukocytes, erythrocytes, trace proteinuria. SpG 1.1014 and hyaline casts noted   CREATININE (mg/dL)  Date Value  06/09/2012 1.2*   CREAT (mg/dL)  Date Value  11/18/2012 1.14*   CREATININE, SER (mg/dL)  Date Value  09/17/2015 3.55*  09/17/2015 3.57*  09/16/2015 3.40*  09/16/2015 3.87*  09/03/2015 1.14*  09/02/2015 1.20*   09/01/2015 1.28*  08/31/2015 1.04*  08/30/2015 1.18*  04/24/2015 1.33*  ] I/Os: I/O last 3 completed shifts: In: 1526.3 [P.O.:240; I.V.:1286.3] Out: -   ROS NSAIDS: No exposure IV Contrast no exposure TMP/SMX no exposure Hypotension no exposure Balance of 12 systems is negative w/ exceptions as above  PMH  Past Medical History  Diagnosis Date  . Anemia   . DVT (deep venous thrombosis) ([name]) 11/06  . Pulmonary embolism ([facility]) 11/06  . Megan cancer ([facility]) 12/11  . Hypertension   . Atrial fibrillation ([name])   . Frequent headaches   . Heart murmur   . Stroke ([facility]egan Megan) 11/30/2014  . Megan cancer ([facility]) 2011   [facility]  Past Surgical History  Procedure Laterality Date  . Appendectomy  1952  . Total hip arthroplasty  01/03/07    Left hip replacement.  . Colectomy  06/2010    partial, Dr. Donne Hendricks complicated by LLE CVT; S/P IVC umbrella & anemia  . Total abdominal hysterectomy w/ bilateral salpingoophorectomy  1973    For Fibroids  . Vena cava filter placement  06/28/2010  . Hip fracture surgery  2006    Trauma   FH  Family History  Problem Relation Age of Onset  . Peripheral vascular disease Father   . Heart attack Mother 85  . Heart disease Mother     MI  . Coronary artery disease Sister   . Diabetes Paternal Grandmother   . Coronary artery disease Maternal Grandfather    SH  reports that she has never smoked. She does not have any smokeless tobacco history on file. She reports that she does not drink  alcohol or use illicit drugs. Allergies No Known Allergies Home medications Prior to Admission medications   Medication Sig Start Date End Date Taking? Authorizing Provider  acetaminophen (TYLENOL) 500 MG tablet Take 500 mg by mouth every 8 (eight) hours as needed for mild pain.    Yes Historical Provider, MD  albuterol (VENTOLIN HFA) 108 (90 Base) MCG/ACT inhaler Inhale 2 puffs into the lungs every 6 (six) hours as needed for wheezing or shortness of breath. 08/14/15  Yes  Megan Branch, MD  ALPRAZolam Duanne Moron) 0.25 MG tablet Take 1 tablet (0.25 mg total) by mouth 2 (two) times daily as needed for anxiety. 08/14/15  Yes Megan Branch, MD  bisacodyl (DULCOLAX) 10 MG suppository Place 10 mg rectally as needed for moderate constipation.   Yes Historical Provider, MD  Calcium Carbonate-Vit D-Min (CALCIUM 1200 PO) Take 2 tablets by mouth daily.    Yes Historical Provider, MD  fentaNYL (DURAGESIC - DOSED MCG/HR) 50 MCG/HR Place 1 patch (50 mcg total) onto the skin every 3 (three) days. 09/03/15  Yes Megan Reasons, MD  Fluticasone-Salmeterol (ADVAIR DISKUS) 100-50 MCG/DOSE AEPB Inhale 1 puff into the lungs 2 (two) times daily. 07/12/15  Yes Megan Branch, MD  furosemide (LASIX) 40 MG tablet Take 1 tablet (40 mg total) by mouth every Monday, Wednesday, and Friday. Patient taking differently: Take 40 mg by mouth daily.  09/03/15  Yes Megan Reasons, MD  magnesium hydroxide (MILK OF MAGNESIA) 400 MG/5ML suspension Take 30 mLs by mouth daily as needed for moderate constipation.   Yes Historical Provider, MD  metoprolol tartrate (LOPRESSOR) 25 MG tablet Take 1 tablet (25 mg total) by mouth 2 (two) times daily. 08/14/15  Yes Megan Branch, MD  Multiple Vitamins-Minerals (MULTIPLE VITAMINS/WOMENS PO) Take by mouth daily.   Yes Historical Provider, MD  OVER THE COUNTER MEDICATION Take 1 Container by mouth every evening.   Yes Historical Provider, MD  promethazine (PHENERGAN) 25 MG tablet TAKE ONE TABLET BY MOUTH EVERY 6 TO 8 HOURS AS NEEDED Patient taking differently: Take 25 mg by mouth every 4 (four) hours as needed. TAKE ONE TABLET BY MOUTH EVERY 6 TO 8 HOURS AS NEEDED 05/11/14  Yes Megan Limes, MD  saccharomyces boulardii (FLORASTOR) 250 MG capsule Take 250 mg by mouth 2 (two) times daily.   Yes Historical Provider, MD  sodium phosphate (FLEET) enema Place 1 enema rectally once. follow package directions   Yes Historical Provider, MD  traMADol (ULTRAM) 50 MG tablet Take 1 tablet (50 mg total) by mouth every 6  (six) hours as needed. for pain 09/03/15  Yes Megan Reasons, MD  verapamil (CALAN) 120 MG tablet Take 1 tablet (120 mg total) by mouth 2 (two) times daily. 08/14/15  Yes Megan Branch, MD  amoxicillin-clavulanate (AUGMENTIN) 875-125 MG tablet Take 1 tablet by mouth 2 (two) times daily. 09/03/15   Megan Reasons, MD  atorvastatin (LIPITOR) 20 MG tablet Take 1 tablet (20 mg total) by mouth daily. 08/28/15   Megan Branch, MD  warfarin (COUMADIN) 2.5 MG tablet Take 1 tablet (2.5 mg total) by mouth daily at 6 PM. First dose start from 3/2 09/05/15   Megan Reasons, MD    Current Medications Scheduled Meds: . feeding supplement  1 Container Oral TID BM  . [START ON 09/18/2015] fentaNYL  50 mcg Transdermal Q72H  . fluticasone furoate-vilanterol  1 puff Inhalation Daily  . metoprolol tartrate  12.5 mg Oral BID  . saccharomyces boulardii  250 mg  Oral BID  . sodium chloride flush  3 mL Intravenous Q12H  . vancomycin  125 mg Oral 4 times per day  . warfarin  2 mg Oral q1800  . Warfarin - Pharmacist Dosing Inpatient   Does not apply q1800   Continuous Infusions:  PRN Meds:.sodium chloride, acetaminophen, ALPRAZolam, levalbuterol, ondansetron (ZOFRAN) IV, sodium chloride flush, traMADol  CBC  Recent Labs Lab 09/16/15 1537 09/16/15 1829 09/17/15 0422  WBC 9.6  --  5.3  NEUTROABS 8.2*  --  3.8  HGB 11.6* 11.6* 9.3*  HCT 38.3 34.0* 31.3*  MCV 88.9  --  89.2  PLT 204  --  [id number]   Basic Metabolic Panel  Recent Labs Lab 09/16/15 1537 09/16/15 1829 09/17/15 0422 09/17/15 0758  NA 137 135 138 138  K 6.5* 6.2* 6.4* 5.9*  CL 99* 104 108 109  CO2 18*  --  22 20*  GLUCOSE 214* 154* 97 88  BUN 29* 32* 28* 28*  CREATININE 3.87* 3.40* 3.57* 3.55*  CALCIUM 10.5*  --  8.4* 8.6*    Physical Exam  Blood pressure 106/68, pulse 69, temperature 98.7 F (37.1 C), temperature source Oral, resp. rate 18, height 5\' 3"  (1.6 m), weight 78.926 kg (174 lb), SpO2 94 %. GEN: Frail, elderly female in no distress ENT: NCAT EYES: EOMI CV:  RRR, no rub or murmur PULM: Clear bilaterally, normal work of breathing ABD: Soft, nontender, nondistended SKIN: No rashes, petechia, purpura EXT: Right lower extremity with 3+ edema, no edema in left lower extremity   Assessment 54F with AKI in setting of poor PO intake and increase in chronic diarrhea, presenting with seizure vs syncope  1. AKI; favor pre-renal/hypovolemia; doubt contrast toxicity; not obstructed; no suggestion of GN 2. Hyperkalemia; improving; related to #1 and PO KCl supplementation 3. Seizure vs Syncope; neurology eval ongoing 4. AoC Diarrhea 5. Hx/o DVT/PE on Warfarin  Plan 1. Resume IVFs: NS @ 138mL/hr x24h; watch for acidosis 2. Obtain orthostatic vital signs 3. BID BMP 4. Agree with eval for CDI 5. Watch K for now, should improve with hydration Daily weights, Daily Renal Panel, Strict I/Os, Avoid nephrotoxins (NSAIDs, judicious IV Contrast)    Pearson Grippe MD [phone] [phone] 09/17/2015, 12:11 PM

## 2015-09-17 NOTE — Progress Notes (Addendum)
PROGRESS NOTE  [name] [id number] DOB: 22-Dec-1926 DOA: 09/16/2015 PCP: Kathlene November, MD  HPI/Recap of past 24 hours:  Feeling better, on room air, denies pain, no n/v, diarrhea has stopped, daughter in room.  Assessment/Plan: Principal Problem:   Seizures ([place]) Active Problems:   Hyperkalemia   Elevated brain natriuretic peptide (BNP) level   Acute renal failure superimposed on stage 3 chronic kidney disease (HCC)   Diarrhea   Acute encephalopathy  ARF on CKDIII,  ua no infection, renal ultrasound no obstructive nephropathy, unclear etiology,likely from dehydration? Patient has been having diarrhea for several days.  Nephrology Dr Joelyn Oms consulted. Input appreciated. Renal function improving.   Hyperkalemia: likely from acute renal failure, ( patient was on potassium supplement 14meq qd prior to admission was d/ed), received insulin/d50/calcium gluconate/sodium bicarb, repeat bmp. ekg no acute changes, continue tele. K trending down.   Seizure? Unresponsiveness: correct metabolic disarray,  MRI brain no acute findings, EEG negative for seizure.  neurology input appreciated. Neurology following.   Diarrhea: improved. c diff, GI pathogen negative,   Lactic acidosis: resolved with ivf. No fever, no leukocytosis,   cxr with patchy bilateral lower lobe consolidation, not definitely changed from 2/24. On room air, no respiratory distress,  Incentive spirometer. Echocardiogram pending. Ct chest atelectasis , bilateral pleural effusion.    Right Lower extremity edema,  Right lower extremity venous US ordered, elevate legs. Doppler negative.   Chronic afib with h/o cva on coumadin, rate controlled, on low pressor and verapamil chronically,  decreased lopressor dose and hold verapamil due to bp low normal.   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 inr therapeutic Doppler negative.   Code Status: full  Family Communication: patient and daughter in room  Disposition Plan: pending cr /k  improvement, returned to snf in a few days   Consultants:  Neurology  nephrology  Procedures:  EEG  Antibiotics:  none   Objective: BP 120/65 mmHg  Pulse 98  Temp(Src) 98 F (36.7 C) (Oral)  Resp 18  Ht 5\' 3"  (1.6 m)  Wt 74.208 kg (163 lb 9.6 oz)  BMI 28.99 kg/m2  SpO2 94%  Intake/Output Summary (Last 24 hours) at 09/18/15 1456 Last data filed at 09/18/15 1349  Gross per 24 hour  Intake   1060 ml  Output   1700 ml  Net   -640 ml   Filed Weights   09/16/15 2204 09/17/15 0601 09/18/15 0542  Weight: 76.749 kg (169 lb 3.2 oz) 78.926 kg (174 lb) 74.208 kg (163 lb 9.6 oz)    Exam:   General:  NAD  Cardiovascular: IRRR  Respiratory: decreased at basis, no wheezing, no rhonchi, no rales  Abdomen: Soft/ND/NT, positive BS  Musculoskeletal: + right lower extremity Edema (report history of DVT right lower leg)  Neuro: aaox3  Data Reviewed: Basic Metabolic Panel:  Recent Labs Lab 09/17/15 0422 09/17/15 0758 09/17/15 1350 09/17/15 1953 09/18/15 0440  NA 138 138 140 140 141  K 6.4* 5.9* 5.7* 5.8* 5.3*  CL 108 109 108 109 109  CO2 22 20* 23 23 23   GLUCOSE 97 88 100* 113* 85  BUN 28* 28* 26* 26* 24*  CREATININE 3.57* 3.55* 3.35* 3.19* 2.75*  CALCIUM 8.4* 8.6* 8.7* 8.3* 8.3*   Liver Function Tests:  Recent Labs Lab 09/16/15 1537  AST 29  ALT 12*  ALKPHOS 97  BILITOT 0.8  PROT 6.7  ALBUMIN 3.0*    Recent Labs Lab 09/16/15 1537  LIPASE 22   No results for input(s):  AMMONIA in the last 168 hours. CBC:  Recent Labs Lab 09/16/15 1537 09/16/15 1829 09/17/15 0422  WBC 9.6  --  5.3  NEUTROABS 8.2*  --  3.8  HGB 11.6* 11.6* 9.3*  HCT 38.3 34.0* 31.3*  MCV 88.9  --  89.2  PLT 204  --  169   Cardiac Enzymes:    Recent Labs Lab 09/17/15 0758  CKTOTAL 12*   BNP (last 3 results)  Recent Labs  09/16/15 1537  BNP 555.0*    ProBNP (last 3 results) No results for input(s): PROBNP in the last 8760 hours.  CBG: No results for  input(s): GLUCAP in the last 168 hours.  Recent Results (from the past 240 hour(s))  MRSA PCR Screening     Status: None   Collection Time: 09/16/15 11:33 PM  Result Value Ref Range Status   MRSA by PCR NEGATIVE NEGATIVE Final    Comment:        The GeneXpert MRSA Assay (FDA approved for NASAL specimens only), is one component of a comprehensive MRSA colonization surveillance program. It is not intended to diagnose MRSA infection nor to guide or monitor treatment for MRSA infections.   Gastrointestinal Panel by PCR , Stool     Status: None   Collection Time: 09/17/15 12:57 AM  Result Value Ref Range Status   Campylobacter species NOT DETECTED NOT DETECTED Final   Plesimonas shigelloides NOT DETECTED NOT DETECTED Final   Salmonella species NOT DETECTED NOT DETECTED Final   Yersinia enterocolitica NOT DETECTED NOT DETECTED Final   Vibrio species NOT DETECTED NOT DETECTED Final   Vibrio cholerae NOT DETECTED NOT DETECTED Final   Enteroaggregative E coli (EAEC) NOT DETECTED NOT DETECTED Final   Enteropathogenic E coli (EPEC) NOT DETECTED NOT DETECTED Final   Enterotoxigenic E coli (ETEC) NOT DETECTED NOT DETECTED Final   Shiga like toxin producing E coli (STEC) NOT DETECTED NOT DETECTED Final   E. coli O157 NOT DETECTED NOT DETECTED Final   Shigella/Enteroinvasive E coli (EIEC) NOT DETECTED NOT DETECTED Final   Cryptosporidium NOT DETECTED NOT DETECTED Final   Cyclospora cayetanensis NOT DETECTED NOT DETECTED Final   Entamoeba histolytica NOT DETECTED NOT DETECTED Final   Giardia lamblia NOT DETECTED NOT DETECTED Final   Adenovirus F40/41 NOT DETECTED NOT DETECTED Final   Astrovirus NOT DETECTED NOT DETECTED Final   Norovirus GI/GII NOT DETECTED NOT DETECTED Final   Rotavirus A NOT DETECTED NOT DETECTED Final   Sapovirus (I, II, IV, and V) NOT DETECTED NOT DETECTED Final  C difficile quick scan w PCR reflex     Status: None   Collection Time: 09/17/15 12:57 AM  Result Value  Ref Range Status   C Diff antigen NEGATIVE NEGATIVE Final   C Diff toxin NEGATIVE NEGATIVE Final   C Diff interpretation Negative for toxigenic C. difficile  Final     Studies: No results found.  Scheduled Meds: . feeding supplement  1 Container Oral TID BM  . fentaNYL  50 mcg Transdermal Q72H  . fluticasone furoate-vilanterol  1 puff Inhalation Daily  . metoprolol tartrate  12.5 mg Oral BID  . saccharomyces boulardii  250 mg Oral BID  . sodium chloride flush  3 mL Intravenous Q12H  . warfarin  2 mg Oral q1800  . Warfarin - Pharmacist Dosing Inpatient   Does not apply q1800    Continuous Infusions: . sodium chloride 100 mL/hr at 09/18/15 0127     Time spent: 63mins  Jayde Mcallister  MD.  [facility] Pager [phone]. If 7PM-7AM, please contact night-coverage at [email or web address], password [email or web address] 09/18/2015, 2:56 PM  LOS: 2 days

## 2015-09-17 NOTE — Progress Notes (Signed)
Utilization review completed. Cataleya Cristina, RN, BSN. 

## 2015-09-17 NOTE — Progress Notes (Signed)
   09/16/15 2204  Vitals  Temp 98 F (36.7 C)  Temp Source Oral  BP (!) 117/51 mmHg  BP Location Left Arm  BP Method Automatic  Patient Position (if appropriate) Lying  Pulse Rate 78  Pulse Rate Source Dinamap  Resp 18  Oxygen Therapy  SpO2 95 %  O2 Device Room Air  Height and Weight  Height 5\' 3"  (1.6 m)  Weight 76.749 kg (169 lb 3.2 oz) (bedscale )  Type of Scale Used Bed  BSA (Calculated - sq m) 1.85 sq meters  BMI (Calculated) 30  Weight in (lb) to have BMI = 25 140.8  admitted pt to rm 3E17 from ED, pt alert and oriented, denied pain, oriented to room, call bell placed within reach, pt placed on cardiac monitor, CCMD made aware, verified by T[place]ia NT. Admission orders carried out. Will continue to monitor.

## 2015-09-17 NOTE — Progress Notes (Signed)
ANTICOAGULATION CONSULT NOTE - Initial Consult  Pharmacy Consult for Coumadin Indication: atrial fibrillation  No Known Allergies  Patient Measurements: Height: 5\' 3"  (160 cm) Weight: 169 lb 3.2 oz (76.749 kg) (bedscale ) IBW/kg (Calculated) : 52.4  Vital Signs: Temp: 98 F (36.7 C) (03/13 2204) Temp Source: Oral (03/13 2204) BP: 117/51 mmHg (03/13 2204) Pulse Rate: 78 (03/13 2204)  Labs:  Recent Labs  09/16/15 1537 09/16/15 1829  HGB 11.6* 11.6*  HCT 38.3 34.0*  PLT 204  --   LABPROT 24.3*  --   INR 2.21*  --   CREATININE 3.87* 3.40*    Estimated Creatinine Clearance: 11.6 mL/min (by C-G formula based on Cr of 3.4).   Medical History: Past Medical History  Diagnosis Date  . Anemia   . DVT (deep venous thrombosis) ([place]) 11/06  . Pulmonary embolism ([place]) 11/06  . Colon cancer ([place]) 12/11  . Hypertension   . Atrial fibrillation ([place])   . Frequent headaches   . Heart murmur   . Stroke ([place]) 11/30/2014  . Colon cancer ([place]) 2011    Medications:  Prescriptions prior to admission  Medication Sig Dispense Refill Last Dose  . acetaminophen (TYLENOL) 500 MG tablet Take 500 mg by mouth every 8 (eight) hours as needed for mild pain.    prn  . albuterol (VENTOLIN HFA) 108 (90 Base) MCG/ACT inhaler Inhale 2 puffs into the lungs every 6 (six) hours as needed for wheezing or shortness of breath. 1 Inhaler 1 prn  . ALPRAZolam (XANAX) 0.25 MG tablet Take 1 tablet (0.25 mg total) by mouth 2 (two) times daily as needed for anxiety. 60 tablet 3 prn  . bisacodyl (DULCOLAX) 10 MG suppository Place 10 mg rectally as needed for moderate constipation.   prn  . Calcium Carbonate-Vit D-Min (CALCIUM 1200 PO) Take 2 tablets by mouth daily.    09/16/2015 at Unknown time  . fentaNYL (DURAGESIC - DOSED MCG/HR) 50 MCG/HR Place 1 patch (50 mcg total) onto the skin every 3 (three) days. 5 patch 0 09/15/2015 at Unknown time  . Fluticasone-Salmeterol (ADVAIR DISKUS) 100-50 MCG/DOSE AEPB Inhale 1  puff into the lungs 2 (two) times daily. 60 each 5 09/16/2015 at am  . furosemide (LASIX) 40 MG tablet Take 1 tablet (40 mg total) by mouth every Monday, Wednesday, and Friday. (Patient taking differently: Take 40 mg by mouth daily. ) 30 tablet 0 09/16/2015 at Unknown time  . magnesium hydroxide (MILK OF MAGNESIA) 400 MG/5ML suspension Take 30 mLs by mouth daily as needed for moderate constipation.   prn  . metoprolol tartrate (LOPRESSOR) 25 MG tablet Take 1 tablet (25 mg total) by mouth 2 (two) times daily. 180 tablet 1 09/16/2015 at 1000  . Multiple Vitamins-Minerals (MULTIPLE VITAMINS/WOMENS PO) Take by mouth daily.   09/16/2015 at Unknown time  . OVER THE COUNTER MEDICATION Take 1 Container by mouth every evening.   09/15/2015 at Unknown time  . promethazine (PHENERGAN) 25 MG tablet TAKE ONE TABLET BY MOUTH EVERY 6 TO 8 HOURS AS NEEDED (Patient taking differently: Take 25 mg by mouth every 4 (four) hours as needed. TAKE ONE TABLET BY MOUTH EVERY 6 TO 8 HOURS AS NEEDED) 5 tablet 0 09/16/2015 at Unknown time  . saccharomyces boulardii (FLORASTOR) 250 MG capsule Take 250 mg by mouth 2 (two) times daily.   09/16/2015 at am  . sodium phosphate (FLEET) enema Place 1 enema rectally once. follow package directions   prn  . traMADol (ULTRAM) 50 MG tablet  Take 1 tablet (50 mg total) by mouth every 6 (six) hours as needed. for pain 30 tablet 3 prn  . verapamil (CALAN) 120 MG tablet Take 1 tablet (120 mg total) by mouth 2 (two) times daily. 180 tablet 1 09/16/2015 at am  . amoxicillin-clavulanate (AUGMENTIN) 875-125 MG tablet Take 1 tablet by mouth 2 (two) times daily. 8 tablet 0   . atorvastatin (LIPITOR) 20 MG tablet Take 1 tablet (20 mg total) by mouth daily. 90 tablet 0 08/29/2015 at Unknown time  . warfarin (COUMADIN) 2.5 MG tablet Take 1 tablet (2.5 mg total) by mouth daily at 6 PM. First dose start from 3/2 30 tablet 0    Scheduled:  . fentaNYL  50 mcg Transdermal Q72H  . fluticasone furoate-vilanterol  1  puff Inhalation Daily  . metoprolol tartrate  25 mg Oral BID  . saccharomyces boulardii  250 mg Oral BID  . sodium chloride flush  3 mL Intravenous Q12H  . verapamil  120 mg Oral BID   Infusions:  . sodium chloride      Assessment: 82yo female presents to ED w/ Sz activity w/ no known h/o Sz, to continue Coumadin for Afib; current INR therapeutic.  Goal of Therapy:  INR 2-3   Plan:  Per 3/2 NH notes, they planned to resume Coumadin 2mg  daily when pt reached INR in low 2s after taking 2.5mg  daily; will follow NH plans for now and begin Coumadin 2mg  po daily and monitor INR for dose adjustments.  Wynona Neat, PharmD, BCPS  09/17/2015,12:26 AM

## 2015-09-17 NOTE — Progress Notes (Signed)
EEG completed, results pending. 

## 2015-09-17 NOTE — Progress Notes (Signed)
  VASCULAR LAB PRELIMINARY  PRELIMINARY  PRELIMINARY  PRELIMINARY  Right lower extremity venous duplex completed.    Preliminary report:  Right:  No evidence of DVT, superficial thrombosis, or Baker's cyst.  [name], RVS 09/17/2015, 3:36 PM

## 2015-09-18 ENCOUNTER — Ambulatory Visit (HOSPITAL_COMMUNITY): Payer: Medicare Other

## 2015-09-18 DIAGNOSIS — R569 Unspecified convulsions: Secondary | ICD-10-CM

## 2015-09-18 DIAGNOSIS — G9340 Encephalopathy, unspecified: Secondary | ICD-10-CM

## 2015-09-18 DIAGNOSIS — N179 Acute kidney failure, unspecified: Principal | ICD-10-CM

## 2015-09-18 DIAGNOSIS — I4891 Unspecified atrial fibrillation: Secondary | ICD-10-CM

## 2015-09-18 DIAGNOSIS — N183 Chronic kidney disease, stage 3 (moderate): Secondary | ICD-10-CM

## 2015-09-18 LAB — BASIC METABOLIC PANEL
Anion gap: 9 (ref 5–15)
BUN: 24 mg/dL — AB (ref 6–20)
CO2: 23 mmol/L (ref 22–32)
CREATININE: 2.75 mg/dL — AB (ref 0.44–1.00)
Calcium: 8.3 mg/dL — ABNORMAL LOW (ref 8.9–10.3)
Chloride: 109 mmol/L (ref 101–111)
GFR calc Af Amer: 17 mL/min — ABNORMAL LOW (ref >60)
GFR, EST NON AFRICAN AMERICAN: 15 mL/min — AB (ref >60)
GLUCOSE: 85 mg/dL (ref 65–99)
POTASSIUM: 5.3 mmol/L — AB (ref 3.5–5.1)
SODIUM: 141 mmol/L (ref 135–145)

## 2015-09-18 LAB — GASTROINTESTINAL PANEL BY PCR, STOOL (REPLACES STOOL CULTURE)
ADENOVIRUS F40/41: NOT DETECTED
ASTROVIRUS: NOT DETECTED
CRYPTOSPORIDIUM: NOT DETECTED
CYCLOSPORA CAYETANENSIS: NOT DETECTED
Campylobacter species: NOT DETECTED
E. coli O157: NOT DETECTED
ENTAMOEBA HISTOLYTICA: NOT DETECTED
ENTEROPATHOGENIC E COLI (EPEC): NOT DETECTED
ENTEROTOXIGENIC E COLI (ETEC): NOT DETECTED
Enteroaggregative E coli (EAEC): NOT DETECTED
Giardia lamblia: NOT DETECTED
Norovirus GI/GII: NOT DETECTED
Plesimonas shigelloides: NOT DETECTED
ROTAVIRUS A: NOT DETECTED
SAPOVIRUS (I, II, IV, AND V): NOT DETECTED
SHIGA LIKE TOXIN PRODUCING E COLI (STEC): NOT DETECTED
Salmonella species: NOT DETECTED
Shigella/Enteroinvasive E coli (EIEC): NOT DETECTED
VIBRIO CHOLERAE: NOT DETECTED
VIBRIO SPECIES: NOT DETECTED
YERSINIA ENTEROCOLITICA: NOT DETECTED

## 2015-09-18 LAB — C DIFFICILE QUICK SCREEN W PCR REFLEX
C DIFFICILE (CDIFF) INTERP: NEGATIVE
C DIFFICLE (CDIFF) ANTIGEN: NEGATIVE
C Diff toxin: NEGATIVE

## 2015-09-18 LAB — PROTIME-INR
INR: 2.47 — ABNORMAL HIGH (ref 0.00–1.49)
PROTHROMBIN TIME: 26.5 s — AB (ref 11.6–15.2)

## 2015-09-18 LAB — ECHOCARDIOGRAM COMPLETE
Height: 63 in
Weight: 2617.6 oz

## 2015-09-18 LAB — UREA NITROGEN, URINE: UREA NITROGEN UR: 153 mg/dL

## 2015-09-18 NOTE — Progress Notes (Signed)
ANTICOAGULATION CONSULT NOTE - Initial Consult  Pharmacy Consult for Coumadin Indication: atrial fibrillation  No Known Allergies  Patient Measurements: Height: 5\' 3"  (160 cm) Weight: 163 lb 9.6 oz (74.208 kg) IBW/kg (Calculated) : 52.4  Vital Signs: Temp: 98.6 F (37 C) (03/15 0542) Temp Source: Oral (03/15 0542) BP: 116/59 mmHg (03/15 0542) Pulse Rate: 94 (03/15 0542)  Labs:  Recent Labs  09/16/15 1537 09/16/15 1829 09/17/15 0321 09/17/15 0422 09/17/15 0758 09/17/15 1350 09/17/15 1953 09/18/15 0440  HGB 11.6* 11.6*  --  9.3*  --   --   --   --   HCT 38.3 34.0*  --  31.3*  --   --   --   --   PLT 204  --   --  169  --   --   --   --   LABPROT 24.3*  --  26.3*  --   --   --   --  26.5*  INR 2.21*  --  2.45*  --   --   --   --  2.47*  CREATININE 3.87* 3.40*  --  3.57* 3.55* 3.35* 3.19* 2.75*  CKTOTAL  --   --   --   --  12*  --   --   --     Assessment: 82yo female presents to ED with seizure activity, AKI and metabolic derangements. On warfarin PTA for afib. INR therapeutic on admission and currently 2.47,no sxs of bleeding and stable CBC  PTA dose: 2 mg daily (per NH records)  Goal of Therapy:  INR 2-3   Plan:  1. Continue home dose of warfarin at 2 mg daily given at 18:00 2. Daily INR and CBC   Vincenza Hews, PharmD, BCPS 09/18/2015, 8:29 AM Pager: [phone]

## 2015-09-18 NOTE — Evaluation (Signed)
Physical Therapy Evaluation Patient Details Name: SEQUENA ZEIDAN MRN: [id number] DOB: 16-Dec-1926 Today's Date: 09/18/2015   History of Present Illness  82yo female presents to ED with seizure activity, AKI and metabolic derangements. On warfarin PTA for afib. INR therapeutic on admission and currently 2.47,no sxs of bleeding and stable CBC  Clinical Impression  Pt admitted with above diagnosis. Pt currently with functional limitations due to the deficits listed below (see PT Problem List). Pt was able to ambulate with RW.  Will need continued SNF to reach goals.  Pt will benefit from skilled PT to increase their independence and safety with mobility to allow discharge to the venue listed below.      Follow Up Recommendations SNF;Supervision for mobility/OOB    Equipment Recommendations  Other (comment) (TBA)    Recommendations for Other Services       Precautions / Restrictions Precautions Precautions: Fall Restrictions Weight Bearing Restrictions: No      Mobility  Bed Mobility Overal bed mobility: Needs Assistance Bed Mobility: Supine to Sit     Supine to sit: Min guard;HOB elevated     General bed mobility comments: able to get to EOB with incr time  Transfers Overall transfer level: Needs assistance Equipment used: Rolling walker (2 wheeled) Transfers: Sit to/from Stand Sit to Stand: Min guard         General transfer comment: Pt needed steadying assist only to RW.   Ambulation/Gait Ambulation/Gait assistance: Min guard Ambulation Distance (Feet): 30 Feet Assistive device: Rolling walker (2 wheeled) Gait Pattern/deviations: Step-through pattern;Trunk flexed Gait velocity: decreased Gait velocity interpretation: Below normal speed for age/gender General Gait Details: Slow, steady gait with Min guard A for balance/RW management. Fatigues. 2/4 DOE.   Stairs            Wheelchair Mobility    Modified Rankin (Stroke Patients Only)       Balance  Overall balance assessment: Needs assistance Sitting-balance support: No upper extremity supported;Feet supported Sitting balance-Leahy Scale: Fair     Standing balance support: Bilateral upper extremity supported;During functional activity Standing balance-Leahy Scale: Poor Standing balance comment: relies on RW for balance.                             Pertinent Vitals/Pain Pain Assessment: No/denies pain  VSS    Home Living Family/patient expects to be discharged to:: Skilled nursing facility Living Arrangements: Children Available Help at Discharge: Family;Available 24 hours/day Type of Home: House Home Access: Stairs to enter Entrance Stairs-Rails: None Entrance Stairs-Number of Steps: 5 Home Layout: One level Home Equipment: Walker - 2 wheels;Shower seat Additional Comments: Was at [facility] for last few weeks.     Prior Function Level of Independence: Needs assistance   Gait / Transfers Assistance Needed: independent with bed mobility, and activity in the home Mod I using RW  ADL's / Homemaking Assistance Needed: Until 3 1/2 weeks prior to admission, pt was able to perform self care at a modified independent level.  Comments: states that daughter helps if needed, but for the most part she is able to get around without help     Hand Dominance   Dominant Hand: Right    Extremity/Trunk Assessment   Upper Extremity Assessment: Defer to OT evaluation           Lower Extremity Assessment: Generalized weakness      Cervical / Trunk Assessment: Kyphotic  Communication   Communication: [facility]  Cognition Arousal/Alertness: Awake/alert Behavior During Therapy: WFL for tasks assessed/performed Overall Cognitive Status: Within Functional Limits for tasks assessed                      General Comments General comments (skin integrity, edema, etc.): got on 3N1 with total assist to wipe bottom    Exercises        Assessment/Plan    PT  Assessment Patient needs continued PT services  PT Diagnosis Generalized weakness   PT Problem List Decreased activity tolerance;Decreased balance;Decreased mobility;Decreased knowledge of use of DME;Decreased safety awareness;Decreased knowledge of precautions  PT Treatment Interventions DME instruction;Gait training;Stair training;Functional mobility training;Therapeutic activities;Therapeutic exercise;Balance training;Patient/family education   PT Goals (Current goals can be found in the Care Plan section) Acute Rehab PT Goals Patient Stated Goal: to get stronger PT Goal Formulation: With patient Time For Goal Achievement: 10/02/15 Potential to Achieve Goals: Good    Frequency Min 2X/week   Barriers to discharge        Co-evaluation               End of Session Equipment Utilized During Treatment: Gait belt Activity Tolerance: Patient tolerated treatment well;Patient limited by fatigue Patient left: in chair;with call bell/phone within reach;with chair alarm set Nurse Communication: Mobility status         Time: [id number] PT Time Calculation (min) (ACUTE ONLY): 26 min   Charges:   PT Evaluation $PT Eval Moderate Complexity: 1 Procedure PT Treatments $Gait Training: 8-22 mins   PT G CodesDenice Paradise 09/29/15, 12:22 PM  [place] Ege Muckey,PT Acute Rehabilitation [phone] (pager)

## 2015-09-18 NOTE — Discharge Instructions (Signed)

## 2015-09-18 NOTE — Progress Notes (Signed)
Echocardiogram 2D Echocardiogram has been performed.  [name] 09/18/2015, 3:04 PM

## 2015-09-18 NOTE — Progress Notes (Signed)
Admit: 09/16/2015 LOS: 2  75F with AKI in setting of poor PO intake and increase in chronic diarrhea, presenting with seizure vs syncope  Subjective:  Feels great this AM, in good spirits Renal function improved, good UOP, denies orthostatic Sx Orthostatics positive: HR inc ~40bpm Diarrhea lessened; CDiff neg  03/14 0701 - 03/15 0700 In: 940 [P.O.:940] Out: 1600 [Urine:1600]  Filed Weights   09/16/15 2204 09/17/15 0601 09/18/15 0542  Weight: 76.749 kg (169 lb 3.2 oz) 78.926 kg (174 lb) 74.208 kg (163 lb 9.6 oz)    Scheduled Meds: . feeding supplement  1 Container Oral TID BM  . fentaNYL  50 mcg Transdermal Q72H  . fluticasone furoate-vilanterol  1 puff Inhalation Daily  . metoprolol tartrate  12.5 mg Oral BID  . saccharomyces boulardii  250 mg Oral BID  . sodium chloride flush  3 mL Intravenous Q12H  . warfarin  2 mg Oral q1800  . Warfarin - Pharmacist Dosing Inpatient   Does not apply q1800   Continuous Infusions: . sodium chloride 100 mL/hr at 09/18/15 0127   PRN Meds:.sodium chloride, acetaminophen, ALPRAZolam, levalbuterol, ondansetron (ZOFRAN) IV, sodium chloride flush, traMADol  Current Labs: reviewed    Physical Exam:  Blood pressure 116/59, pulse 94, temperature 98.6 F (37 C), temperature source Oral, resp. rate 20, height 5\' 3"  (1.6 m), weight 74.208 kg (163 lb 9.6 oz), SpO2 91 %. GEN: Frail, elderly female in no distress ENT: NCAT EYES: EOMI CV: RRR, no rub or murmur PULM: Clear bilaterally, normal work of breathing ABD: Soft, nontender, nondistended SKIN: No rashes, petechia, purpura EXT: Right lower extremity with 3+ edema, no edema in left lower extremity  A 1. AKI; favor pre-renal/hypovolemia; orthostatic positive; doubt contrast toxicity; not obstructed; no suggestion of GN 2. Hyperkalemia; improved; related to #1 and PO KCl supplementation 3. Seizure vs Syncope; neurology eval ongoing 4. AoC Diarrhea 5. Hx/o DVT/PE on Warfarin  P 1. Cont IVFs  for another 24h 2. Recheck Orthostatics, see if improved 3. No KCl supplements upon discharge   Pearson Grippe MD 09/18/2015, 10:47 AM   Recent Labs Lab 09/17/15 1350 09/17/15 1953 09/18/15 0440  NA 140 140 141  K 5.7* 5.8* 5.3*  CL 108 109 109  CO2 23 23 23   GLUCOSE 100* 113* 85  BUN 26* 26* 24*  CREATININE 3.35* 3.19* 2.75*  CALCIUM 8.7* 8.3* 8.3*    Recent Labs Lab 09/16/15 1537 09/16/15 1829 09/17/15 0422  WBC 9.6  --  5.3  NEUTROABS 8.2*  --  3.8  HGB 11.6* 11.6* 9.3*  HCT 38.3 34.0* 31.3*  MCV 88.9  --  89.2  PLT 204  --  169

## 2015-09-18 NOTE — Consult Note (Signed)
   [facility] CM Inpatient Consult   09/18/2015  Annika [facility] 08-19-1926 [id number] Referral received to assess for care management services, patient is a re-admission with a HX of pneumonia and Acute kidney Injury.     Met with the patient and daughter [name], regarding the benefits of [facility] services. Explained that [place] is a covered benefit of Medicare insurance. They endorses Dr. Larose Kells as the primary care provider.  Daughter and patient states that she was doing fairly well at [facility] the past week or so and had worsening symptoms and had to be re-admitted. Daughter states that her mother is planning to return to the skilled facility and then return to her daughter's home in [place]  She states that the patient had been with her for a while since her decline earlier this year.  Explained that [facility] Maser is not in the [place] [facility] area for home visits but the patient can receive follow up if needed at her MD's office.   Review information for [facility] Care Management and a folder was provided with contact information.  Explained that [facility] does not interfere with or replace any services arranged by the inpatient care management staff.  Patient declined services with [place] [facility] at this time. But the brochure with contact information was given.  For questions, please contact: Natividad Brood, RN BSN [facility] Liaison  [phone] business mobile phone Toll free office [phone]

## 2015-09-18 NOTE — Care Management Note (Signed)
Case Management Note  Patient Details  Name: [name] MRN: [id number] Date of Birth: 07/21/1926  Subjective/Objective:82 y.o. F admitted from [facility] for Acute Encephalopathy. AMS with Seizure like activity and ARF superimposed on Chronic Kidney Disease.                     Action/Plan: CSW note reflects will return to [facility] at discharge. CM will continue to follow for additional discharge/disposition needs.    Expected Discharge Date:                  Expected Discharge Plan:  [facility] (pt is from [facility])  In-House Referral:  Clinical Social Work  Discharge planning Services  CM Consult  Post Acute Care Choice:    Choice offered to:  Patient, Adult Children (Daughter: [name])  DME Arranged:    DME Agency:     HH Arranged:    [place] Agency:     Status of Service:  In process, will continue to follow  Medicare Important Message Given:    Date Medicare IM Given:    Medicare IM give by:    Date Additional Medicare IM Given:    Additional Medicare Important Message give by:     If discussed at [place] of Stay Meetings, dates discussed:    Additional Comments:  Delrae Sawyers, RN 09/18/2015, 10:44 AM

## 2015-09-19 DIAGNOSIS — R4182 Altered mental status, unspecified: Secondary | ICD-10-CM | POA: Insufficient documentation

## 2015-09-19 LAB — BASIC METABOLIC PANEL
ANION GAP: 8 (ref 5–15)
BUN: 17 mg/dL (ref 6–20)
CALCIUM: 8 mg/dL — AB (ref 8.9–10.3)
CHLORIDE: 114 mmol/L — AB (ref 101–111)
CO2: 20 mmol/L — AB (ref 22–32)
Creatinine, Ser: 1.56 mg/dL — ABNORMAL HIGH (ref 0.44–1.00)
GFR calc non Af Amer: 29 mL/min — ABNORMAL LOW (ref >60)
GFR, EST AFRICAN AMERICAN: 34 mL/min — AB (ref >60)
GLUCOSE: 71 mg/dL (ref 65–99)
Potassium: 4.8 mmol/L (ref 3.5–5.1)
Sodium: 142 mmol/L (ref 135–145)

## 2015-09-19 LAB — PROTIME-INR
INR: 2.29 — AB (ref 0.00–1.49)
PROTHROMBIN TIME: 25 s — AB (ref 11.6–15.2)

## 2015-09-19 LAB — CBC
HCT: 32.8 % — ABNORMAL LOW (ref 36.0–46.0)
Hemoglobin: 9.7 g/dL — ABNORMAL LOW (ref 12.0–15.0)
MCH: 26.6 pg (ref 26.0–34.0)
MCHC: 29.6 g/dL — AB (ref 30.0–36.0)
MCV: 89.9 fL (ref 78.0–100.0)
PLATELETS: 159 10*3/uL (ref 150–400)
RBC: 3.65 MIL/uL — ABNORMAL LOW (ref 3.87–5.11)
RDW: 16 % — AB (ref 11.5–15.5)
WBC: 4.7 10*3/uL (ref 4.0–10.5)

## 2015-09-19 MED ORDER — METOPROLOL TARTRATE 25 MG PO TABS
25.0000 mg | ORAL_TABLET | Freq: Two times a day (BID) | ORAL | Status: DC
  Administered 2015-09-19 – 2015-09-20 (×2): 25 mg via ORAL
  Filled 2015-09-19 (×2): qty 1

## 2015-09-19 MED ORDER — VERAPAMIL HCL 120 MG PO TABS
120.0000 mg | ORAL_TABLET | Freq: Two times a day (BID) | ORAL | Status: DC
Start: 2015-09-19 — End: 2015-09-19

## 2015-09-19 MED ORDER — METOPROLOL TARTRATE 12.5 MG HALF TABLET
12.5000 mg | ORAL_TABLET | Freq: Once | ORAL | Status: AC
  Administered 2015-09-19: 12.5 mg via ORAL
  Filled 2015-09-19: qty 1

## 2015-09-19 MED ORDER — VERAPAMIL HCL 120 MG PO TABS
120.0000 mg | ORAL_TABLET | Freq: Two times a day (BID) | ORAL | Status: DC
  Administered 2015-09-19 – 2015-09-20 (×2): 120 mg via ORAL
  Filled 2015-09-19 (×3): qty 1

## 2015-09-19 NOTE — Discharge Summary (Addendum)
Physician Discharge Summary  [name] NWG:[id number] DOB: [birth year] DOA: 09/16/2015  PCP: Megan November, MD  Admit date: 09/16/2015 Discharge date: 09/19/2015  Time spent: 35 minutes  Recommendations for Outpatient Follow-up:  Needs repeat B-met in 24 hour after resumption of lasix.   Discharge Diagnoses:   Acute renal failure superimposed on stage 3 chronic kidney disease (HCC)   Seizures (HCC)   Hyperkalemia   Elevated brain natriuretic peptide (BNP) level   Diarrhea   Acute encephalopathy   Discharge Condition: stable  Diet recommendation: heart healthy  Filed Weights   09/17/15 0601 09/18/15 0542 09/19/15 0617  Weight: 78.926 kg (174 lb) 74.208 kg (163 lb 9.6 oz) 76.25 kg (168 lb 1.6 oz)    History of present illness:  Ms. [name] is a 82 year old female with a past medical history significant for HTN, HLD, atrial fibrillation on anticoagulation, colon cancer s/p resection with short gut syndrome, CVA, DVT/PE; who presents after having 2 episodes of seizure like activity with unresponsiveness. The patient and her daughter provide history. Patient had been at [facility] after her recent hospitalization for epistaxis with hypercoagulability(INR >10) and pneumonia 2/24 - 2/28. Over the last 2 days or so she had had decreased appetite with nausea, generalized weakness, and multiple episodes of diarrhea. However, today the patient was reportedly sitting on edge of bed and had vomited and then dropped her head and had less than a minute of full body movement with eyes open mouth moving, but the patient was not responsive. When the patient came to make it given her 25 mg of Zofran check files noted that her blood sugar was elevated at 270. Subsequently patient had a another episode some minutes later the last visit; left in time prior to EMS being called. Her daughter notes that the patient likely loss of bowel, but this is not out of the norm for the patient. No  reported tongue biting. There was no trauma to her head. Patient currently denies feeling nauseous at this time and has had no subsequent episodes. She denies any chest pain, shortness of breath, abdominal pain, recent falls, fever, chills, or significant cough. She does complain of right lower extremity swelling will for which she's been on Lasix for quite some time. During her last hospitalization she was placed on potassium and this was continued even after discharge. Patient had never been on potassium supplementation in the past. Also, patient had recently received antibiotics for the possible pneumonia during her last hospitalization. Diarrhea is normally common for short periods in time and patient had been having these symptoms over the last week. She cannot quantify the number of episodes and stools were yellow in color and liquid in consistency.  Upon admission the patient was evaluated with CT of the brain which showed no acute signs of a bleed or infarct and stated chronic changes seen on previous CTs. Chest x-ray which was similar to previous showing patchy basilar opacities similar to previous imaging. Lab work revealed potassium 6.5, BUN 29, creatinine 3.87, glucose 214, BNP 555, and hemoglobin 11.6. Patient's EKG showed no acute changes.  Hospital Course:  ARF on CKDIII,  ua no infection, renal ultrasound no obstructive nephropathy, unclear etiology,likely from dehydration? Patient has been having diarrhea for several days.  Nephrology Dr [name] consulted. Input appreciated. Renal function improving. Cr decrease at 1.5 Will resume lasix at discharge, needs close follow up.   Hyperkalemia: likely from acute renal failure, ( patient was on potassium supplement 58mq qd  prior to admission was d/ed), received insulin/d50/calcium gluconate/sodium bicarb, repeat bmp. ekg no acute changes, continue tele. K trending down.   Seizure? Unresponsiveness: correct metabolic disarray, MRI brain no  acute findings, EEG negative for seizure. neurology input appreciated. Neurology following.  No need for AED.   Diarrhea: improved. c diff, GI pathogen negative, resolved. Has history of short bowel syndrome.   Lactic acidosis: resolved with ivf. No fever, no leukocytosis,   cxr with patchy bilateral lower lobe consolidation, not definitely changed from 2/24. On room air, no respiratory distress, Incentive spirometer. Echocardiogram normal ef. . Ct chest atelectasis , bilateral pleural effusion.  Will resume home dose lasix, need B-met to follow renal function.   Right Lower extremity edema, Right lower extremity venous US ordered, elevate legs. Doppler negative.   Chronic afib with h/o cva on coumadin, rate controlled, on low pressor and verapamil chronically,resume home dose of metoprolol. HR better on home dose verapamil. Will proceed with discharge today.     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 inr therapeutic Doppler negative.   Procedures:  ECHO; Ef 55 %  Consultations:  Nephrology   Discharge Exam: Filed Vitals:   09/18/15 2035 09/19/15 0617  BP: 129/47 128/65  Pulse: 114 107  Temp: 98.4 F (36.9 C) 98.2 F (36.8 C)  Resp: 18 18    General: NAD Cardiovascular: S 1, S 2 RRR Respiratory: CTA  Discharge Instructions   Discharge Instructions    Diet - low sodium heart healthy    Complete by:  As directed      Increase activity slowly    Complete by:  As directed           Current Discharge Medication List    CONTINUE these medications which have NOT CHANGED   Details  acetaminophen (TYLENOL) 500 MG tablet Take 500 mg by mouth every 8 (eight) hours as needed for mild pain.     albuterol (VENTOLIN HFA) 108 (90 Base) MCG/ACT inhaler Inhale 2 puffs into the lungs every 6 (six) hours as needed for wheezing or shortness of breath. Qty: 1 Inhaler, Refills: 1    ALPRAZolam (XANAX) 0.25 MG tablet Take 1 tablet (0.25 mg total) by mouth 2 (two) times daily as needed for  anxiety. Qty: 60 tablet, Refills: 3    bisacodyl (DULCOLAX) 10 MG suppository Place 10 mg rectally as needed for moderate constipation.    Calcium Carbonate-Vit D-Min (CALCIUM 1200 PO) Take 2 tablets by mouth daily.     fentaNYL (DURAGESIC - DOSED MCG/HR) 50 MCG/HR Place 1 patch (50 mcg total) onto the skin every 3 (three) days. Qty: 5 patch, Refills: 0    Fluticasone-Salmeterol (ADVAIR DISKUS) 100-50 MCG/DOSE AEPB Inhale 1 puff into the lungs 2 (two) times daily. Qty: 60 each, Refills: 5    furosemide (LASIX) 40 MG tablet Take 1 tablet (40 mg total) by mouth every Monday, Wednesday, and Friday. Qty: 30 tablet, Refills: 0    magnesium hydroxide (MILK OF MAGNESIA) 400 MG/5ML suspension Take 30 mLs by mouth daily as needed for moderate constipation.    metoprolol tartrate (LOPRESSOR) 25 MG tablet Take 1 tablet (25 mg total) by mouth 2 (two) times daily. Qty: 180 tablet, Refills: 1    Multiple Vitamins-Minerals (MULTIPLE VITAMINS/WOMENS PO) Take by mouth daily.    OVER THE COUNTER MEDICATION Take 1 Container by mouth every evening.    saccharomyces boulardii (FLORASTOR) 250 MG capsule Take 250 mg by mouth 2 (two) times daily.    traMADol (ULTRAM) 50 MG  tablet Take 1 tablet (50 mg total) by mouth every 6 (six) hours as needed. for pain Qty: 30 tablet, Refills: 3    atorvastatin (LIPITOR) 20 MG tablet Take 1 tablet (20 mg total) by mouth daily. Qty: 90 tablet, Refills: 0    warfarin (COUMADIN) 2.5 MG tablet Take 1 tablet (2.5 mg total) by mouth daily at 6 PM. First dose start from 3/2 Qty: 30 tablet, Refills: 0      STOP taking these medications     promethazine (PHENERGAN) 25 MG tablet      sodium phosphate (FLEET) enema      verapamil (CALAN) 120 MG tablet      amoxicillin-clavulanate (AUGMENTIN) 875-125 MG tablet        No Known Allergies Follow-up Information    Follow up with [facility].   Specialty:  [place]  information:   [address] [place] [phone]      Follow up with Megan November, MD In 1 week.   Specialty:  Internal Medicine   Contact information:   [address] [place] [address] [phone]        The results of significant diagnostics from this hospitalization (including imaging, microbiology, ancillary and laboratory) are listed below for reference.    Significant Diagnostic Studies: Dg Chest 2 View  09/16/2015  CLINICAL DATA:  Seizures.  Clinical concern for pneumonia. EXAM: CHEST  2 VIEW COMPARISON:  08/30/2015 chest radiograph. FINDINGS: Stable cardiomediastinal silhouette with mild cardiomegaly. No pneumothorax. No pleural effusion. Patchy consolidation in the lower lobes is not definitely changed from 08/30/2015. No pulmonary edema. No additional sites of consolidation. IMPRESSION: 1. Patchy bilateral lower lobe consolidation, not definitely changed from 08/30/2015, which could represent persistence of a prior pneumonia and/or superimposed new pneumonia. Recommend follow-up PA and lateral post treatment chest radiographs in 6-8 weeks. If the lower lobe opacities persist on follow-up chest radiographs, a chest CT would then be recommended for further evaluation. 2. Stable cardiomegaly without pulmonary edema. Electronically Signed   By: Ilona Sorrel M.D.   On: 09/16/2015 16:29   Dg Chest 2 View  08/30/2015  CLINICAL DATA:  Cough with epistaxis EXAM: CHEST  2 VIEW COMPARISON:  August 06, 2011 FINDINGS: There is no appreciable edema or consolidation. Heart is mildly enlarged with pulmonary vascularity within normal limits. No adenopathy. Bones are osteoporotic. There is degenerative change in thoracic spine. No apparent pneumothorax. IMPRESSION: Mild cardiac enlargement.  No edema or consolidation. Electronically Signed   By: Lowella Grip III M.D.   On: 08/30/2015 10:58   Dg Elbow 2 Views Left  09/02/2015  CLINICAL DATA:  Left  shoulder pain for 1 week, worsening left elbow pain today EXAM: LEFT ELBOW - 2 VIEW COMPARISON:  None. FINDINGS: Two views of the left elbow submitted. No acute fracture or subluxation. No posterior fat pad sign. There is mild spurring of radial head. Minimal spurring of humeral epicondyles. IMPRESSION: No acute fracture or subluxation. Mild degenerative changes. No posterior fat pad sign. Electronically Signed   By: Lahoma Crocker M.D.   On: 09/02/2015 15:22   Ct Head Wo Contrast  09/16/2015  CLINICAL DATA:  Seizures EXAM: CT HEAD WITHOUT CONTRAST TECHNIQUE: Contiguous axial images were obtained from the base of the skull through the vertex without intravenous contrast. COMPARISON:  August 30, 2015 FINDINGS: Mild diffuse atrophy is stable. There is a stable left-sided posterior fossa extra-axial arachnoid  cyst measuring 3.3 x 1.8 cm, stable. There is no evidence of intra-axial mass. There is no hemorrhage, subdural or epidural fluid collection, or midline shift. There is patchy small vessel disease throughout the centra semiovale bilaterally. There is a prior small lacunar infarct in the posterior limb of the right external capsule. No new gray-white compartment lesions are identified. No acute infarct is evident. The bony calvarium appears intact. The mastoid air cells are clear. There are no intraorbital lesions apparent. There is a small retention cyst in the posterior left maxillary antrum. There is mild mucosal thickening in the inferior right maxillary antrum. There is polyposis in the anterior right naris, stable. There is leftward deviation of the nasal septum. IMPRESSION: Atrophy with supratentorial small vessel disease, stable. Prior small lacunar infarct in the right external capsule, stable. No acute infarct evident. No hemorrhage. No intra-axial mass. Stable posterior fossa extra-axial cyst on the left. Areas of paranasal sinus disease as well as anterior right nasal polyposis. Electronically Signed    By: Lowella Grip III M.D.   On: 09/16/2015 16:22   Ct Head Wo Contrast  08/30/2015  CLINICAL DATA:  General body weakness, loss of appetite for 2-3 weeks. Headache. Elevated INR. EXAM: CT HEAD WITHOUT CONTRAST TECHNIQUE: Contiguous axial images were obtained from the base of the skull through the vertex without intravenous contrast. COMPARISON:  08/10/2004 FINDINGS: There is atrophy and chronic small vessel disease changes. No acute intracranial abnormality. Specifically, no hemorrhage, hydrocephalus, mass lesion, acute infarction, or significant intracranial injury. No acute calvarial abnormality. Visualized paranasal sinuses and mastoids clear. Orbital soft tissues unremarkable. IMPRESSION: No acute intracranial abnormality. Atrophy, chronic microvascular disease. Electronically Signed   By: Rolm Baptise M.D.   On: 08/30/2015 14:12   Ct Chest Wo Contrast  09/17/2015  CLINICAL DATA:  Pneumonia, shortness of breath on exertion for several months EXAM: CT CHEST WITHOUT CONTRAST TECHNIQUE: Multidetector CT imaging of the chest was performed following the standard protocol without IV contrast. COMPARISON:  09/16/2015 FINDINGS: Mediastinum/Nodes: Moderate cardiomegaly. No significant pericardial effusion. Extensive 3 vessel coronary artery calcification. No significant hilar or mediastinal adenopathy appreciated on this noncontrast study. Thoracic aorta shows calcification but no dilatation. There are secretions or reflux material within the esophagus. Lungs/Pleura: Not appreciated on comparison study, there is a small left and small to moderate right pleural effusion. There is dependent atelectasis in the right upper lobe. Mild reticular nodular opacity lateral inferior right middle lobe. The right lower lobe demonstrates consolidation in the dependent portion most consistent with compressive atelectasis. On the left, minimal inferolateral lingular atelectasis is noted. There is dependent consolidation in  the area of pleural effusion. Upper abdomen: No acute abnormalities Musculoskeletal: No acute findings IMPRESSION: Bilateral pleural effusions with underlying consolidation, with additional dependent consolidation in the posterior aspect of the right upper lobe. Airspace opacity is likely largely due to atelectasis although possibility of pneumonia on either side is not excluded. Electronically Signed   By: Skipper Cliche M.D.   On: 09/17/2015 13:29   Ct Angio Chest Pe W/cm &/or Wo Cm  08/30/2015  CLINICAL DATA:  Coughing up blood x day or two. Sob on exertion for a while per pt. EXAM: CT ANGIOGRAPHY CHEST WITH CONTRAST TECHNIQUE: Multidetector CT imaging of the chest was performed using the standard protocol during bolus administration of intravenous contrast. Multiplanar CT image reconstructions and MIPs were obtained to evaluate the vascular anatomy. CONTRAST:  52m OMNIPAQUE IOHEXOL 350 MG/ML SOLN COMPARISON:  06/16/2010 FINDINGS: Vascular: Right arm  IV contrast injection. The SVC is patent. Right atrial enlargement. Mild biventricular enlargement. Dilated central pulmonary arteries. Satisfactory opacification of pulmonary arteries noted, and there is no evidence of pulmonary emboli. Patent bilateral pulmonary veins. Left atrial enlargement. Scattered coronary calcifications. Adequate contrast opacification of the thoracic aorta with no evidence of dissection, aneurysm, or stenosis. There is classic 3-vessel brachiocephalic arch anatomy without proximal stenosis. Calcified plaque in the arch and descending thoracic aorta. Mediastinum/Lymph Nodes: No pericardial effusion. No hilar or mediastinal adenopathy. Lungs/Pleura: Dense airspace consolidation in basilar segments of the right lower lobe. Scattered airspace opacities in the posterior and medial basal segments left lower lobe, superior segment right lower lobe, and posterior right middle lobe. Scattered poorly marginated nodular sub solid opacities  throughout the right upper lobe and lingula. These airspace opacities were not present on prior scan. No pleural effusion. Upper abdomen: Atheromatous aorta.  No acute abnormality. Musculoskeletal: Bridging osteophytes across multiple contiguous levels in the mid thoracic spine. Spondylitic changes in the lower thoracic spine. Degenerative changes in bilateral shoulders. Review of the MIP images confirms the above findings. IMPRESSION: 1. Negative for acute PE or thoracic aortic dissection. 2. Extensive airspace disease most marked in the right lower lobe, suspect infectious/inflammatory process. Consider follow-up to confirm appropriate resolution . 3. Four-chamber cardiac enlargement with dilated central pulmonary arteries suggesting pulmonary hypertension. 4. Atherosclerosis, including aortic and coronary artery disease. Please note that although the presence of coronary artery calcium documents the presence of coronary artery disease, the severity of this disease and any potential stenosis cannot be assessed on this non-gated CT examination. Assessment for potential risk factor modification, dietary therapy or pharmacologic therapy may be warranted, if clinically indicated. Electronically Signed   By: Lucrezia Europe M.D.   On: 08/30/2015 12:54   Mr Brain Wo Contrast  09/17/2015  CLINICAL DATA:  Two episodes of seizure like activity with unresponsiveness. History of atrial fibrillation, colon cancer status post resection, an stroke. EXAM: MRI HEAD WITHOUT CONTRAST TECHNIQUE: Multiplanar, multiecho pulse sequences of the brain and surrounding structures were obtained without intravenous contrast. COMPARISON:  Head CT 09/16/2015 FINDINGS: The patient was unable to tolerate the complete examination. Axial diffusion, FLAIR, and T2 and sagittal T1 weighted sequences were obtained. T1 images are moderately motion degraded, with mild motion artifact on the other sequences. There is no restricted diffusion to indicate  acute infarct. No acute intracranial hemorrhage, mass, midline shift, or extra-axial fluid collection is identified. There is mild generalized cerebral atrophy. 3.3 x 2.2 cm arachnoid cyst is noted in the posterior fossa with mild flattening of the posteromedial left cerebellar hemisphere. Patchy T2 hyperintensities throughout the subcortical and deep cerebral white matter bilaterally are nonspecific but compatible with moderately advanced chronic small vessel ischemia. A chronic lacunar infarct is noted in the right lentiform nucleus. There is a slightly dilated perivascular space versus punctate chronic infarct in the left lentiform nucleus. Mild chronic small vessel ischemic changes are noted in the brainstem. Prior bilateral cataract extraction is noted. No significant inflammatory changes seen in the paranasal sinuses are mastoid air cells. Major intracranial vascular flow voids are grossly preserved. IMPRESSION: 1. Motion degraded incomplete examination.  No acute infarct. 2. Moderately advanced chronic small vessel ischemic disease. Electronically Signed   By: Logan Bores M.D.   On: 09/17/2015 07:57   US Renal  09/16/2015  CLINICAL DATA:  Acute kidney injury EXAM: RENAL / URINARY TRACT ULTRASOUND COMPLETE COMPARISON:  08/08/2014 FINDINGS: Right Kidney: Length: 9.6 cm. Cortical thinning with increased  echogenicity. No hydronephrosis. No evidence of mass Left Kidney: Length: 9 cm. Poorly visualized. Previously seen cystic mass is not resolved. No hydronephrosis. Bladder: Appears normal for degree of bladder distention. IMPRESSION: 1. No hydronephrosis. 2. Bilateral renal atrophy. Electronically Signed   By: Monte Fantasia M.D.   On: 09/16/2015 22:46   Dg Shoulder Left  08/30/2015  CLINICAL DATA:  nosebleed onset yesterday and spitting blood. Pt also c/o generalized weakness and decrease appetite x 3 weeks. left shoulder pain worsening x 2 weeks. Denies injury. Pt is unable to move her left arm at all  without sharp pain, scapula was tender during imaging as well. Unable to raise arm for lateral imaging. Lateral chest xray attempted with arm down to capture shoulder injury as well. EXAM: LEFT SHOULDER - 2+ VIEW COMPARISON:  None. FINDINGS: There is no evidence of fracture or dislocation. There is no evidence of arthropathy or other focal bone abnormality. Spurring in the visualized mid thoracic spine. Atheromatous aortic arch. Patchy axillary artery calcifications. Soft tissues are unremarkable. IMPRESSION: Negative. Electronically Signed   By: Lucrezia Europe M.D.   On: 08/30/2015 10:58    Microbiology: Recent Results (from the past 240 hour(s))  MRSA PCR Screening     Status: None   Collection Time: 09/16/15 11:33 PM  Result Value Ref Range Status   MRSA by PCR NEGATIVE NEGATIVE Final    Comment:        The GeneXpert MRSA Assay (FDA approved for NASAL specimens only), is one component of a comprehensive MRSA colonization surveillance program. It is not intended to diagnose MRSA infection nor to guide or monitor treatment for MRSA infections.   Gastrointestinal Panel by PCR , Stool     Status: None   Collection Time: 09/17/15 12:57 AM  Result Value Ref Range Status   Campylobacter species NOT DETECTED NOT DETECTED Final   Plesimonas shigelloides NOT DETECTED NOT DETECTED Final   Salmonella species NOT DETECTED NOT DETECTED Final   Yersinia enterocolitica NOT DETECTED NOT DETECTED Final   Vibrio species NOT DETECTED NOT DETECTED Final   Vibrio cholerae NOT DETECTED NOT DETECTED Final   Enteroaggregative E coli (EAEC) NOT DETECTED NOT DETECTED Final   Enteropathogenic E coli (EPEC) NOT DETECTED NOT DETECTED Final   Enterotoxigenic E coli (ETEC) NOT DETECTED NOT DETECTED Final   Shiga like toxin producing E coli (STEC) NOT DETECTED NOT DETECTED Final   E. coli O157 NOT DETECTED NOT DETECTED Final   Shigella/Enteroinvasive E coli (EIEC) NOT DETECTED NOT DETECTED Final   Cryptosporidium  NOT DETECTED NOT DETECTED Final   Cyclospora cayetanensis NOT DETECTED NOT DETECTED Final   Entamoeba histolytica NOT DETECTED NOT DETECTED Final   Giardia lamblia NOT DETECTED NOT DETECTED Final   Adenovirus F40/41 NOT DETECTED NOT DETECTED Final   Astrovirus NOT DETECTED NOT DETECTED Final   Norovirus GI/GII NOT DETECTED NOT DETECTED Final   Rotavirus A NOT DETECTED NOT DETECTED Final   Sapovirus (I, II, IV, and V) NOT DETECTED NOT DETECTED Final  C difficile quick scan w PCR reflex     Status: None   Collection Time: 09/17/15 12:57 AM  Result Value Ref Range Status   C Diff antigen NEGATIVE NEGATIVE Final   C Diff toxin NEGATIVE NEGATIVE Final   C Diff interpretation Negative for toxigenic C. difficile  Final     Labs: Basic Metabolic Panel:  Recent Labs Lab 09/17/15 0758 09/17/15 1350 09/17/15 1953 09/18/15 0440 09/19/15 0440  NA 138 140 140 141  142  K 5.9* 5.7* 5.8* 5.3* 4.8  CL 109 108 109 109 114*  CO2 20* '23 23 23 ' 20*  GLUCOSE 88 100* 113* 85 71  BUN 28* 26* 26* 24* 17  CREATININE 3.55* 3.35* 3.19* 2.75* 1.56*  CALCIUM 8.6* 8.7* 8.3* 8.3* 8.0*   Liver Function Tests:  Recent Labs Lab 09/16/15 1537  AST 29  ALT 12*  ALKPHOS 97  BILITOT 0.8  PROT 6.7  ALBUMIN 3.0*    Recent Labs Lab 09/16/15 1537  LIPASE 22   No results for input(s): AMMONIA in the last 168 hours. CBC:  Recent Labs Lab 09/16/15 1537 09/16/15 1829 09/17/15 0422 09/19/15 0440  WBC 9.6  --  5.3 4.7  NEUTROABS 8.2*  --  3.8  --   HGB 11.6* 11.6* 9.3* 9.7*  HCT 38.3 34.0* 31.3* 32.8*  MCV 88.9  --  89.2 89.9  PLT 204  --  169 159   Cardiac Enzymes:  Recent Labs Lab 09/17/15 0758  CKTOTAL 12*   BNP: BNP (last 3 results)  Recent Labs  09/16/15 1537  BNP 555.0*    ProBNP (last 3 results) No results for input(s): PROBNP in the last 8760 hours.  CBG: No results for input(s): GLUCAP in the last 168 hours.     Signed:  Elmarie Shiley MD.  [facility] 09/19/2015, 11:55 AM

## 2015-09-19 NOTE — Progress Notes (Signed)
Admit: 09/16/2015 LOS: 3  25F with AKI in setting of poor PO intake and increase in chronic diarrhea, presenting with seizure vs syncope  Subjective:  Remains cheerful, in chair this AM Big improvement in GFR; great UOP  03/15 0701 - 03/16 0700 In: 5485.4 [P.O.:1660; I.V.:3825.4] Out: 1650 [Urine:1650]  Filed Weights   09/17/15 0601 09/18/15 0542 09/19/15 0617  Weight: 78.926 kg (174 lb) 74.208 kg (163 lb 9.6 oz) 76.25 kg (168 lb 1.6 oz)    Scheduled Meds: . feeding supplement  1 Container Oral TID BM  . fentaNYL  50 mcg Transdermal Q72H  . fluticasone furoate-vilanterol  1 puff Inhalation Daily  . metoprolol tartrate  12.5 mg Oral BID  . saccharomyces boulardii  250 mg Oral BID  . sodium chloride flush  3 mL Intravenous Q12H  . warfarin  2 mg Oral q1800  . Warfarin - Pharmacist Dosing Inpatient   Does not apply q1800   Continuous Infusions:   PRN Meds:.sodium chloride, acetaminophen, ALPRAZolam, levalbuterol, ondansetron (ZOFRAN) IV, sodium chloride flush, traMADol  Current Labs: reviewed    Physical Exam:  Blood pressure 128/65, pulse 107, temperature 98.2 F (36.8 C), temperature source Oral, resp. rate 18, height 5\' 3"  (1.6 m), weight 76.25 kg (168 lb 1.6 oz), SpO2 93 %. GEN: Frail, elderly female in no distress ENT: NCAT EYES: EOMI CV: RRR, no rub or murmur PULM: Clear bilaterally, normal work of breathing ABD: Soft, nontender, nondistended SKIN: No rashes, petechia, purpura EXT: Right lower extremity with 3+ edema, no edema in left lower extremity  A 1. AKI; favor pre-renal/hypovolemia; orthostatic positive; doubt contrast toxicity; not obstructed; no suggestion of GN; Recovered 2. CKD3, BL SCr 1.0-1.2 3. Hyperkalemia; improved; related to #1 and PO KCl supplementation 4. Seizure vs Syncope; neurology eval ongoing 5. AoC Diarrhea 6. Hx/o DVT/PE on Warfarin  P Will sign off for now.  Please call with any questions or concerns.  Pt does not need follow up  with nephrology at this time.      Pearson Grippe MD 09/19/2015, 10:36 AM   Recent Labs Lab 09/17/15 1953 09/18/15 0440 09/19/15 0440  NA 140 141 142  K 5.8* 5.3* 4.8  CL 109 109 114*  CO2 23 23 20*  GLUCOSE 113* 85 71  BUN 26* 24* 17  CREATININE 3.19* 2.75* 1.56*  CALCIUM 8.3* 8.3* 8.0*    Recent Labs Lab 09/16/15 1537 09/16/15 1829 09/17/15 0422 09/19/15 0440  WBC 9.6  --  5.3 4.7  NEUTROABS 8.2*  --  3.8  --   HGB 11.6* 11.6* 9.3* 9.7*  HCT 38.3 34.0* 31.3* 32.8*  MCV 88.9  --  89.2 89.9  PLT 204  --  169 159

## 2015-09-19 NOTE — Progress Notes (Signed)
ANTICOAGULATION CONSULT NOTE - Initial Consult  Pharmacy Consult for Coumadin Indication: atrial fibrillation  No Known Allergies  Patient Measurements: Height: 5\' 3"  (160 cm) Weight: 168 lb 1.6 oz (76.25 kg) IBW/kg (Calculated) : 52.4  Vital Signs: Temp: 98.2 F (36.8 C) (03/16 0617) Temp Source: Oral (03/16 0617) BP: 128/65 mmHg (03/16 0617) Pulse Rate: 107 (03/16 0617)  Labs:  Recent Labs  09/16/15 1537 09/16/15 1829 09/17/15 0321 09/17/15 0422 09/17/15 0758  09/17/15 1953 09/18/15 0440 09/19/15 0440  HGB 11.6* 11.6*  --  9.3*  --   --   --   --  9.7*  HCT 38.3 34.0*  --  31.3*  --   --   --   --  32.8*  PLT 204  --   --  169  --   --   --   --  159  LABPROT 24.3*  --  26.3*  --   --   --   --  26.5* 25.0*  INR 2.21*  --  2.45*  --   --   --   --  2.47* 2.29*  CREATININE 3.87* 3.40*  --  3.57* 3.55*  < > 3.19* 2.75* 1.56*  CKTOTAL  --   --   --   --  12*  --   --   --   --   < > = values in this interval not displayed.  Assessment: 82yo female presents to ED with seizure activity, AKI and metabolic derangements. On warfarin PTA for afib. INR therapeutic on admission and currently 2.29,no sxs of bleeding and stable CBC  PTA dose: 2 mg daily (per [facility] records)  Goal of Therapy:  INR 2-3   Plan:  1. Continue home dose of warfarin at 2 mg daily given at 18:00 2. Daily INR and CBC   Vincenza Hews, PharmD, BCPS 09/19/2015, 7:58 AM Pager: [phone]

## 2015-09-19 NOTE — Progress Notes (Addendum)
Notified pt's daughter [name] and  nurse Olu that pt will not be d/c today due heart rate little high.    Karie Kirks, Therapist, sports.

## 2015-09-19 NOTE — Care Management Important Message (Signed)
Important Message  Patient Details  Name: [name] MRN: [id number] Date of Birth: 07-15-1926   Medicare Important Message Given:  Yes    [name] 09/19/2015, 3:17 PM

## 2015-09-19 NOTE — Progress Notes (Signed)
Noted pt's heart rate 128-139.  Pt asymptomatic.  Notified Dr. Tyrell Antonio.  And instructed to keep pt in hospital until tomorrow.  Pt notified.  Will continue to monitor.  Karie Kirks, Therapist, sports.

## 2015-09-19 NOTE — Clinical Social Work Note (Signed)
Patient medically stable for discharge and is returning to [facility] skilled nursing facility. Admissions director, Lexine Baton contacted and discharge paperwork forwarded to SNF. Daughter, [name] contacted regarding discharge and ambulance transport.  Kaizen Ibsen Givens, MSW, LCSW Licensed Clinical Social Worker [place] [phone]

## 2015-09-19 NOTE — Progress Notes (Signed)
Interval History:                                                                                                                      [name] is an 82 y.o. female patient with  Two episodes concerning for possible seizure. Currently she is back to her baseline and has had no further episodes. She feels much better.    Past Medical History: Past Medical History  Diagnosis Date  . Anemia   . DVT (deep venous thrombosis) ([place]) 11/06  . Pulmonary embolism ([place]) 11/06  . Colon cancer ([place]) 12/11  . Hypertension   . Atrial fibrillation ([place])   . Frequent headaches   . Heart murmur   . Stroke ([place]) 11/30/2014  . Colon cancer ([place]) 2011    Past Surgical History  Procedure Laterality Date  . Appendectomy  1952  . Total hip arthroplasty  01/03/07    Left hip replacement.  . Colectomy  06/2010    partial, Dr. Donne Hazel complicated by LLE CVT; S/P IVC umbrella & anemia  . Total abdominal hysterectomy w/ bilateral salpingoophorectomy  1973    For Fibroids  . Vena cava filter placement  06/28/2010  . Hip fracture surgery  2006    Trauma    Family History: Family History  Problem Relation Age of Onset  . Peripheral vascular disease Father   . Heart attack Mother 105  . Heart disease Mother     MI  . Coronary artery disease Sister   . Diabetes Paternal Grandmother   . Coronary artery disease Maternal Grandfather     Social History:   reports that she has never smoked. She does not have any smokeless tobacco history on file. She reports that she does not drink alcohol or use illicit drugs.  Allergies:  No Known Allergies   Medications:                                                                                                                         Current facility-administered medications:  .  0.9 %  sodium chloride infusion, 250 mL, Intravenous, PRN, Norval Morton, MD .  acetaminophen (TYLENOL) tablet 650 mg, 650 mg, Oral, Q4H PRN, Norval Morton, MD .   ALPRAZolam Duanne Moron) tablet 0.25 mg, 0.25 mg, Oral, BID PRN, Norval Morton, MD .  feeding supplement (BOOST / RESOURCE BREEZE) liquid 1 Container, 1 Container, Oral, TID BM, Norval Morton, MD, 1 Container at 09/18/15  2253 .  fentaNYL (DURAGESIC - dosed mcg/hr) 50 mcg, 50 mcg, Transdermal, Q72H, Florencia Reasons, MD, 50 mcg at 09/18/15 0900 .  fluticasone furoate-vilanterol (BREO ELLIPTA) 100-25 MCG/INH 1 puff, 1 puff, Inhalation, Daily, Norval Morton, MD, 1 puff at 09/19/15 0856 .  levalbuterol (XOPENEX) nebulizer solution 0.63 mg, 0.63 mg, Nebulization, Q6H PRN, Norval Morton, MD .  metoprolol tartrate (LOPRESSOR) tablet 12.5 mg, 12.5 mg, Oral, BID, Florencia Reasons, MD, 12.5 mg at 09/19/15 0915 .  ondansetron (ZOFRAN) injection 4 mg, 4 mg, Intravenous, Q6H PRN, Norval Morton, MD .  saccharomyces boulardii (FLORASTOR) capsule 250 mg, 250 mg, Oral, BID, Norval Morton, MD, 250 mg at 09/19/15 0915 .  sodium chloride flush (NS) 0.9 % injection 3 mL, 3 mL, Intravenous, Q12H, Rondell A Carin Shipp, MD, 3 mL at 09/19/15 1000 .  sodium chloride flush (NS) 0.9 % injection 3 mL, 3 mL, Intravenous, PRN, Norval Morton, MD .  traMADol (ULTRAM) tablet 50 mg, 50 mg, Oral, Q6H PRN, Norval Morton, MD, 50 mg at 09/19/15 0915 .  warfarin (COUMADIN) tablet 2 mg, 2 mg, Oral, q1800, Veronda P Bryk, RPH, 2 mg at 09/18/15 1800 .  Warfarin - Pharmacist Dosing Inpatient, , Does not apply, q1800, Laren Everts, [facility]   Neurologic Examination:                                                                                                     Today's Vitals   09/18/15 2300 09/19/15 0617 09/19/15 0750 09/19/15 1001  BP:  128/65    Pulse:  107    Temp:  98.2 F (36.8 C)    TempSrc:  Oral    Resp:  18    Height:      Weight:  76.25 kg (168 lb 1.6 oz)    SpO2:  93%    PainSc: 0-No pain  0-No pain Asleep    Evaluation of higher integrative functions including: Level of alertness: Alert,  Oriented to time, place and  person Speech: fluent, no evidence of dysarthria or aphasia noted.  Test the following cranial nerves: 2-12 grossly intact Motor examination: Normal tone, bulk, full 5/5 motor strength in all 4 extremities Examination of sensation : Normal and symmetric sensation to pinprick in all 4 extremities and on face Examination of deep tendon reflexes: 2+, normal and symmetric in all extremities, normal plantars bilaterally   Lab Results: Basic Metabolic Panel:  Recent Labs Lab 09/17/15 0758 09/17/15 1350 09/17/15 1953 09/18/15 0440 09/19/15 0440  NA 138 140 140 141 142  K 5.9* 5.7* 5.8* 5.3* 4.8  CL 109 108 109 109 114*  CO2 20* 23 23 23  20*  GLUCOSE 88 100* 113* 85 71  BUN 28* 26* 26* 24* 17  CREATININE 3.55* 3.35* 3.19* 2.75* 1.56*  CALCIUM 8.6* 8.7* 8.3* 8.3* 8.0*    Liver Function Tests:  Recent Labs Lab 09/16/15 1537  AST 29  ALT 12*  ALKPHOS 97  BILITOT 0.8  PROT 6.7  ALBUMIN 3.0*    Recent Labs Lab 09/16/15 1537  LIPASE  22   No results for input(s): AMMONIA in the last 168 hours.  CBC:  Recent Labs Lab 09/16/15 1537 09/16/15 1829 09/17/15 0422 09/19/15 0440  WBC 9.6  --  5.3 4.7  NEUTROABS 8.2*  --  3.8  --   HGB 11.6* 11.6* 9.3* 9.7*  HCT 38.3 34.0* 31.3* 32.8*  MCV 88.9  --  89.2 89.9  PLT 204  --  169 159    Cardiac Enzymes:  Recent Labs Lab 09/17/15 0758  CKTOTAL 12*    Lipid Panel: No results for input(s): CHOL, TRIG, HDL, CHOLHDL, VLDL, LDLCALC in the last 168 hours.  CBG: No results for input(s): GLUCAP in the last 168 hours.  Microbiology: Results for orders placed or performed during the hospital encounter of 09/16/15  MRSA PCR Screening     Status: None   Collection Time: 09/16/15 11:33 PM  Result Value Ref Range Status   MRSA by PCR NEGATIVE NEGATIVE Final    Comment:        The GeneXpert MRSA Assay (FDA approved for NASAL specimens only), is one component of a comprehensive MRSA colonization surveillance program. It  is not intended to diagnose MRSA infection nor to guide or monitor treatment for MRSA infections.   Gastrointestinal Panel by PCR , Stool     Status: None   Collection Time: 09/17/15 12:57 AM  Result Value Ref Range Status   Campylobacter species NOT DETECTED NOT DETECTED Final   Plesimonas shigelloides NOT DETECTED NOT DETECTED Final   Salmonella species NOT DETECTED NOT DETECTED Final   Yersinia enterocolitica NOT DETECTED NOT DETECTED Final   Vibrio species NOT DETECTED NOT DETECTED Final   Vibrio cholerae NOT DETECTED NOT DETECTED Final   Enteroaggregative E coli (EAEC) NOT DETECTED NOT DETECTED Final   Enteropathogenic E coli (EPEC) NOT DETECTED NOT DETECTED Final   Enterotoxigenic E coli (ETEC) NOT DETECTED NOT DETECTED Final   Shiga like toxin producing E coli (STEC) NOT DETECTED NOT DETECTED Final   E. coli O157 NOT DETECTED NOT DETECTED Final   Shigella/Enteroinvasive E coli (EIEC) NOT DETECTED NOT DETECTED Final   Cryptosporidium NOT DETECTED NOT DETECTED Final   Cyclospora cayetanensis NOT DETECTED NOT DETECTED Final   Entamoeba histolytica NOT DETECTED NOT DETECTED Final   Giardia lamblia NOT DETECTED NOT DETECTED Final   Adenovirus F40/41 NOT DETECTED NOT DETECTED Final   Astrovirus NOT DETECTED NOT DETECTED Final   Norovirus GI/GII NOT DETECTED NOT DETECTED Final   Rotavirus A NOT DETECTED NOT DETECTED Final   Sapovirus (I, II, IV, and V) NOT DETECTED NOT DETECTED Final  C difficile quick scan w PCR reflex     Status: None   Collection Time: 09/17/15 12:57 AM  Result Value Ref Range Status   C Diff antigen NEGATIVE NEGATIVE Final   C Diff toxin NEGATIVE NEGATIVE Final   C Diff interpretation Negative for toxigenic C. difficile  Final    Imaging: Dg Chest 2 View  09/16/2015  CLINICAL DATA:  Seizures.  Clinical concern for pneumonia. EXAM: CHEST  2 VIEW COMPARISON:  08/30/2015 chest radiograph. FINDINGS: Stable cardiomediastinal silhouette with mild cardiomegaly.  No pneumothorax. No pleural effusion. Patchy consolidation in the lower lobes is not definitely changed from 08/30/2015. No pulmonary edema. No additional sites of consolidation. IMPRESSION: 1. Patchy bilateral lower lobe consolidation, not definitely changed from 08/30/2015, which could represent persistence of a prior pneumonia and/or superimposed new pneumonia. Recommend follow-up PA and lateral post treatment chest radiographs in 6-8 weeks. If  the lower lobe opacities persist on follow-up chest radiographs, a chest CT would then be recommended for further evaluation. 2. Stable cardiomegaly without pulmonary edema. Electronically Signed   By: Ilona Sorrel M.D.   On: 09/16/2015 16:29   Ct Head Wo Contrast  09/16/2015  CLINICAL DATA:  Seizures EXAM: CT HEAD WITHOUT CONTRAST TECHNIQUE: Contiguous axial images were obtained from the base of the skull through the vertex without intravenous contrast. COMPARISON:  August 30, 2015 FINDINGS: Mild diffuse atrophy is stable. There is a stable left-sided posterior fossa extra-axial arachnoid cyst measuring 3.3 x 1.8 cm, stable. There is no evidence of intra-axial mass. There is no hemorrhage, subdural or epidural fluid collection, or midline shift. There is patchy small vessel disease throughout the centra semiovale bilaterally. There is a prior small lacunar infarct in the posterior limb of the right external capsule. No new gray-white compartment lesions are identified. No acute infarct is evident. The bony calvarium appears intact. The mastoid air cells are clear. There are no intraorbital lesions apparent. There is a small retention cyst in the posterior left maxillary antrum. There is mild mucosal thickening in the inferior right maxillary antrum. There is polyposis in the anterior right naris, stable. There is leftward deviation of the nasal septum. IMPRESSION: Atrophy with supratentorial small vessel disease, stable. Prior small lacunar infarct in the right  external capsule, stable. No acute infarct evident. No hemorrhage. No intra-axial mass. Stable posterior fossa extra-axial cyst on the left. Areas of paranasal sinus disease as well as anterior right nasal polyposis. Electronically Signed   By: Lowella Grip III M.D.   On: 09/16/2015 16:22   Ct Chest Wo Contrast  09/17/2015  CLINICAL DATA:  Pneumonia, shortness of breath on exertion for several months EXAM: CT CHEST WITHOUT CONTRAST TECHNIQUE: Multidetector CT imaging of the chest was performed following the standard protocol without IV contrast. COMPARISON:  09/16/2015 FINDINGS: Mediastinum/Nodes: Moderate cardiomegaly. No significant pericardial effusion. Extensive 3 vessel coronary artery calcification. No significant hilar or mediastinal adenopathy appreciated on this noncontrast study. Thoracic aorta shows calcification but no dilatation. There are secretions or reflux material within the esophagus. Lungs/Pleura: Not appreciated on comparison study, there is a small left and small to moderate right pleural effusion. There is dependent atelectasis in the right upper lobe. Mild reticular nodular opacity lateral inferior right middle lobe. The right lower lobe demonstrates consolidation in the dependent portion most consistent with compressive atelectasis. On the left, minimal inferolateral lingular atelectasis is noted. There is dependent consolidation in the area of pleural effusion. Upper abdomen: No acute abnormalities Musculoskeletal: No acute findings IMPRESSION: Bilateral pleural effusions with underlying consolidation, with additional dependent consolidation in the posterior aspect of the right upper lobe. Airspace opacity is likely largely due to atelectasis although possibility of pneumonia on either side is not excluded. Electronically Signed   By: Skipper Cliche M.D.   On: 09/17/2015 13:29   Mr Brain Wo Contrast  09/17/2015  CLINICAL DATA:  Two episodes of seizure like activity with  unresponsiveness. History of atrial fibrillation, colon cancer status post resection, an stroke. EXAM: MRI HEAD WITHOUT CONTRAST TECHNIQUE: Multiplanar, multiecho pulse sequences of the brain and surrounding structures were obtained without intravenous contrast. COMPARISON:  Head CT 09/16/2015 FINDINGS: The patient was unable to tolerate the complete examination. Axial diffusion, FLAIR, and T2 and sagittal T1 weighted sequences were obtained. T1 images are moderately motion degraded, with mild motion artifact on the other sequences. There is no restricted diffusion to indicate acute infarct.  No acute intracranial hemorrhage, mass, midline shift, or extra-axial fluid collection is identified. There is mild generalized cerebral atrophy. 3.3 x 2.2 cm arachnoid cyst is noted in the posterior fossa with mild flattening of the posteromedial left cerebellar hemisphere. Patchy T2 hyperintensities throughout the subcortical and deep cerebral white matter bilaterally are nonspecific but compatible with moderately advanced chronic small vessel ischemia. A chronic lacunar infarct is noted in the right lentiform nucleus. There is a slightly dilated perivascular space versus punctate chronic infarct in the left lentiform nucleus. Mild chronic small vessel ischemic changes are noted in the brainstem. Prior bilateral cataract extraction is noted. No significant inflammatory changes seen in the paranasal sinuses are mastoid air cells. Major intracranial vascular flow voids are grossly preserved. IMPRESSION: 1. Motion degraded incomplete examination.  No acute infarct. 2. Moderately advanced chronic small vessel ischemic disease. Electronically Signed   By: Logan Bores M.D.   On: 09/17/2015 07:57   US Renal  09/16/2015  CLINICAL DATA:  Acute kidney injury EXAM: RENAL / URINARY TRACT ULTRASOUND COMPLETE COMPARISON:  08/08/2014 FINDINGS: Right Kidney: Length: 9.6 cm. Cortical thinning with increased echogenicity. No hydronephrosis.  No evidence of mass Left Kidney: Length: 9 cm. Poorly visualized. Previously seen cystic mass is not resolved. No hydronephrosis. Bladder: Appears normal for degree of bladder distention. IMPRESSION: 1. No hydronephrosis. 2. Bilateral renal atrophy. Electronically Signed   By: Monte Fantasia M.D.   On: 09/16/2015 22:46   EEG: Impression:  Abnormal awake and drowsy routine inpatient EEG suggestive of mild encephalopathy as described. Clinical correlation is recommended    Assessment and plan:   [name] is an 82 y.o. female patient with 2 episodes concerning for seizure versus syncope. Both MRI and EEG showed no etiology for seizure or events. Likely syncope. At this time would not initiate AED. Neurology will S/O and recommend no further neurological diagnostic testing.   Seen with Dr. Silverio Decamp. Please see his attestation note for A/P for any additional work up recommendations.

## 2015-09-20 ENCOUNTER — Ambulatory Visit: Payer: Medicare Other | Admitting: Internal Medicine

## 2015-09-20 ENCOUNTER — Encounter: Payer: Self-pay | Admitting: Internal Medicine

## 2015-09-20 ENCOUNTER — Non-Acute Institutional Stay (SKILLED_NURSING_FACILITY): Payer: Medicare Other | Admitting: Internal Medicine

## 2015-09-20 DIAGNOSIS — R05 Cough: Secondary | ICD-10-CM | POA: Diagnosis not present

## 2015-09-20 DIAGNOSIS — R569 Unspecified convulsions: Secondary | ICD-10-CM | POA: Diagnosis not present

## 2015-09-20 DIAGNOSIS — I10 Essential (primary) hypertension: Secondary | ICD-10-CM

## 2015-09-20 DIAGNOSIS — N183 Chronic kidney disease, stage 3 unspecified: Secondary | ICD-10-CM

## 2015-09-20 DIAGNOSIS — Z7901 Long term (current) use of anticoagulants: Secondary | ICD-10-CM | POA: Diagnosis not present

## 2015-09-20 DIAGNOSIS — R600 Localized edema: Secondary | ICD-10-CM

## 2015-09-20 DIAGNOSIS — M6281 Muscle weakness (generalized): Secondary | ICD-10-CM | POA: Diagnosis not present

## 2015-09-20 DIAGNOSIS — R938 Abnormal findings on diagnostic imaging of other specified body structures: Secondary | ICD-10-CM | POA: Diagnosis not present

## 2015-09-20 DIAGNOSIS — E875 Hyperkalemia: Secondary | ICD-10-CM | POA: Diagnosis not present

## 2015-09-20 DIAGNOSIS — E785 Hyperlipidemia, unspecified: Secondary | ICD-10-CM

## 2015-09-20 DIAGNOSIS — R0989 Other specified symptoms and signs involving the circulatory and respiratory systems: Secondary | ICD-10-CM | POA: Diagnosis not present

## 2015-09-20 DIAGNOSIS — N179 Acute kidney failure, unspecified: Secondary | ICD-10-CM

## 2015-09-20 DIAGNOSIS — G9340 Encephalopathy, unspecified: Secondary | ICD-10-CM | POA: Diagnosis not present

## 2015-09-20 DIAGNOSIS — I509 Heart failure, unspecified: Secondary | ICD-10-CM | POA: Diagnosis not present

## 2015-09-20 DIAGNOSIS — R9389 Abnormal findings on diagnostic imaging of other specified body structures: Secondary | ICD-10-CM

## 2015-09-20 DIAGNOSIS — I482 Chronic atrial fibrillation, unspecified: Secondary | ICD-10-CM

## 2015-09-20 DIAGNOSIS — L03115 Cellulitis of right lower limb: Secondary | ICD-10-CM | POA: Diagnosis not present

## 2015-09-20 DIAGNOSIS — R197 Diarrhea, unspecified: Secondary | ICD-10-CM

## 2015-09-20 DIAGNOSIS — K912 Postsurgical malabsorption, not elsewhere classified: Secondary | ICD-10-CM | POA: Diagnosis not present

## 2015-09-20 DIAGNOSIS — I129 Hypertensive chronic kidney disease with stage 1 through stage 4 chronic kidney disease, or unspecified chronic kidney disease: Secondary | ICD-10-CM | POA: Diagnosis not present

## 2015-09-20 DIAGNOSIS — R2689 Other abnormalities of gait and mobility: Secondary | ICD-10-CM | POA: Diagnosis not present

## 2015-09-20 LAB — PROTIME-INR
INR: 2.44 — ABNORMAL HIGH (ref 0.00–1.49)
Prothrombin Time: 26.2 seconds — ABNORMAL HIGH (ref 11.6–15.2)

## 2015-09-20 NOTE — Progress Notes (Signed)
Report given to nurse Rosendo Gros at [facility]. Transportation setup for 1pm. No distress noted of pt at this time. Awaiting for transport.   Lovena Le, student nurse

## 2015-09-20 NOTE — Progress Notes (Signed)
Transportation has been arranged for patient to return back [place] at 1:00pm. Patient will transport to facility via [facility]. Patient and daughter made aware. No further CSW needs requested at this time. CSW to sign off.  Please consult if further CSW needs arise.   Lucius Conn, [place] Worker [facility] Ph: [phone]

## 2015-09-20 NOTE — Progress Notes (Signed)
Daughter updated of patient's time of disposition.  Ave Filter, RN

## 2015-09-20 NOTE — Progress Notes (Signed)
Physical Therapy Treatment Patient Details Name: [name] MRN: [id number] DOB: [birth year] Today's Date: 09/20/2015    History of Present Illness 82yo female presents to ED with seizure activity, AKI and metabolic derangements. On warfarin PTA for afib. INR therapeutic on admission and currently 2.47,no sxs of bleeding and stable CBC    PT Comments    Pt admitted with above diagnosis. Pt currently with functional limitations due to the deficits listed below (see PT Problem List). Pt was able to ambulate with RW with very little assist but does fatigue with incr distance and needed 1 sitting rest break due to DOE 3/4.   Pt will benefit from skilled PT to increase their independence and safety with mobility to allow discharge to the venue listed below.    Follow Up Recommendations  SNF;Supervision for mobility/OOB     Equipment Recommendations  Other (comment) (TBA)    Recommendations for Other Services       Precautions / Restrictions Precautions Precautions: Fall Restrictions Weight Bearing Restrictions: No    Mobility  Bed Mobility                  Transfers Overall transfer level: Needs assistance Equipment used: Rolling walker (2 wheeled) Transfers: Sit to/from Stand Sit to Stand: Min guard         General transfer comment: Pt needed steadying assist only to RW.   Ambulation/Gait Ambulation/Gait assistance: Min guard Ambulation Distance (Feet): 150 Feet(125 feet and 25 feet) Assistive device: Rolling walker (2 wheeled) Gait Pattern/deviations: Step-through pattern;Decreased stride length;Trunk flexed Gait velocity: decreased Gait velocity interpretation: Below normal speed for age/gender General Gait Details: Slow, steady gait with Min guard A for balance/RW management. Fatigues. 3/4 DOE. Had to have 1 sitting rest break.   Stairs            Wheelchair Mobility    Modified Rankin (Stroke Patients Only)       Balance Overall balance  assessment: Needs assistance Sitting-balance support: No upper extremity supported;Feet supported Sitting balance-Leahy Scale: Fair     Standing balance support: Bilateral upper extremity supported;During functional activity Standing balance-Leahy Scale: Poor Standing balance comment: relies on RW for support.                     Cognition Arousal/Alertness: Awake/alert Behavior During Therapy: WFL for tasks assessed/performed Overall Cognitive Status: Within Functional Limits for tasks assessed                      Exercises      General Comments General comments (skin integrity, edema, etc.): Pt ambulated to bathroom and used the bathroom there.  Able to wipe herself but PT checked behind her and made sure she was clean.  Pt had to use grab bars in the bathroom to come to standing due to low toilet.       Pertinent Vitals/Pain Pain Assessment: No/denies pain  VSS    Home Living                      Prior Function            PT Goals (current goals can now be found in the care plan section) Progress towards PT goals: Progressing toward goals    Frequency  Min 2X/week    PT Plan Current plan remains appropriate    Co-evaluation             End of Session  Equipment Utilized During Treatment: Gait belt Activity Tolerance: Patient tolerated treatment well;Patient limited by fatigue Patient left: in chair;with call bell/phone within reach;with chair alarm set     Time: [id number] PT Time Calculation (min) (ACUTE ONLY): 15 min  Charges:  $Gait Training: 8-22 mins                    G Codes:      Ramisa Duman, Godfrey Pick 09/21/15, 1:12 PM [employer] Acute Rehabilitation [phone] (pager)

## 2015-09-20 NOTE — Progress Notes (Signed)
[place] for Coumadin Indication: atrial fibrillation  No Known Allergies  Patient Measurements: Height: 5\' 3"  (160 cm) Weight: 177 lb 4.8 oz (80.423 kg) IBW/kg (Calculated) : 52.4  Vital Signs: Temp: 98.9 F (37.2 C) (03/17 0506) Temp Source: Oral (03/17 0506) BP: 142/62 mmHg (03/17 0506) Pulse Rate: 93 (03/17 0506)  Labs:  Recent Labs  09/17/15 1953 09/18/15 0440 09/19/15 0440 09/20/15 0616  HGB  --   --  9.7*  --   HCT  --   --  32.8*  --   PLT  --   --  159  --   LABPROT  --  26.5* 25.0* 26.2*  INR  --  2.47* 2.29* 2.44*  CREATININE 3.19* 2.75* 1.56*  --     Assessment: 82yo female presents to ED with seizure activity, AKI and metabolic derangements. On warfarin PTA for afib. INR is at goal (INR= 2.44)  PTA dose: 2 mg daily (per [facility] records)  Goal of Therapy:  INR 2-3   Plan:  -Continue home dose of warfarin at 2 mg daily given at 18:00 -Daily INR  Hildred Laser, Pharm D 09/20/2015 8:36 AM

## 2015-09-20 NOTE — Progress Notes (Signed)
Patient ID: [name], female   DOB: [birth year], 82 y.o.   MRN: [id number] [id number]: [id number] Name: [name]  Sex: female Age: 82 y.o. DOB: [birth year]  [place] #: [name]  Facility/Room: 110 P Level Of Care: SNF Provider: [name] Emergency Contacts: Extended Emergency Contact Information Primary Emergency Contact: [name],[name] Address: [address]          [place], [place] [address] [name] of [place] Phone: [phone] Mobile Phone: [phone] Relation: Daughter  Code Status:   Allergies: Review of patient's allergies indicates no known allergies.  Chief Complaint  Patient presents with  . Readmit To SNF    HPI: Patient is 82 y.o. female with HTN, HLD, atrial fibrillation on anticoagulation, colon cancer s/p resection with short gut syndrome, CVA, DVT/PE; who presents after having 2 episodes of seizure like activity with unresponsiveness. The patient and her daughter provide history. Patient had been at [facility] rehabilitation center after her recent hospitalization for epistaxis with hypercoagulability(INR >10) and pneumonia 2/24 - 2/28. Over the last 2 days or so she had had decreased appetite with nausea, generalized weakness, and multiple episodes of diarrhea. Pt was admitted to [facility], [facility] from 3/13-17 in which pt was dx with ARF and hyperkalemia. It appeared that seizures were ruled out. Pt is admitted back to SNF on 3/17. While at SNF pt wil be followed for cAF, tx with verapamil, metoprolol and coumadin, HTN, tx with verapamil and metoprolol and HLD, tx with lipitor 20 mg.  Past Medical History  Diagnosis Date  . Anemia   . DVT (deep venous thrombosis) ([place]) 11/06  . Pulmonary embolism ([place]) 11/06  . Colon cancer ([place]) 12/11  . Hypertension   . Atrial fibrillation ([place])   . Frequent headaches   . Heart murmur   . Stroke ([place]) 11/30/2014  . Colon cancer ([place]) 2011  . Acute renal failure superimposed on stage 3 chronic kidney disease ([place])    . Seizures ([place])   . Hyperkalemia   . Elevated brain natriuretic peptide (BNP) level   . Diarrhea   . Acute encephalopathy     Past Surgical History  Procedure Laterality Date  . Appendectomy  1952  . Total hip arthroplasty  01/03/07    Left hip replacement.  . Colectomy  06/2010    partial, Dr. Donne Hazel complicated by LLE CVT; S/P IVC umbrella & anemia  . Total abdominal hysterectomy w/ bilateral salpingoophorectomy  1973    For Fibroids  . Vena cava filter placement  06/28/2010  . Hip fracture surgery  2006    Trauma      Medication List    Notice    This visit is during an admission. Changes to the med list made in this visit will be reflected in the After Visit Summary of the admission.     Current Outpatient Prescriptions on File Prior to Visit  Medication Sig Dispense Refill  . acetaminophen (TYLENOL) 500 MG tablet Take 500 mg by mouth every 8 (eight) hours as needed for mild pain.     Marland Kitchen albuterol (VENTOLIN HFA) 108 (90 Base) MCG/ACT inhaler Inhale 2 puffs into the lungs every 6 (six) hours as needed for wheezing or shortness of breath. 1 Inhaler 1  . ALPRAZolam (XANAX) 0.25 MG tablet Take 1 tablet (0.25 mg total) by mouth 2 (two) times daily as needed for anxiety. 60 tablet 3  . atorvastatin (LIPITOR) 20 MG tablet Take 1 tablet (20 mg total) by mouth daily. 90 tablet  0  . bisacodyl (DULCOLAX) 10 MG suppository Place 10 mg rectally as needed for moderate constipation.    . Calcium Carbonate-Vit D-Min (CALCIUM 1200 PO) Take 2 tablets by mouth daily.     . fentaNYL (DURAGESIC - DOSED MCG/HR) 50 MCG/HR Place 1 patch (50 mcg total) onto the skin every 3 (three) days. 5 patch 0  . Fluticasone-Salmeterol (ADVAIR DISKUS) 100-50 MCG/DOSE AEPB Inhale 1 puff into the lungs 2 (two) times daily. 60 each 5  . furosemide (LASIX) 40 MG tablet Take 1 tablet (40 mg total) by mouth every Monday, Wednesday, and Friday. (Patient taking differently: Take 40 mg by mouth daily. ) 30 tablet 0   . magnesium hydroxide (MILK OF MAGNESIA) 400 MG/5ML suspension Take 30 mLs by mouth daily as needed for moderate constipation.    . metoprolol tartrate (LOPRESSOR) 25 MG tablet Take 1 tablet (25 mg total) by mouth 2 (two) times daily. 180 tablet 1  . Multiple Vitamins-Minerals (MULTIPLE VITAMINS/WOMENS PO) Take by mouth daily.    . traMADol (ULTRAM) 50 MG tablet Take 1 tablet (50 mg total) by mouth every 6 (six) hours as needed. for pain 30 tablet 3   No current facility-administered medications on file prior to visit.   +  No orders of the defined types were placed in this encounter.    Immunization History  Administered Date(s) Administered  . Influenza Split 03/30/2012  . Influenza Whole 04/30/2006, 04/20/2008, 04/15/2009  . Influenza, High Dose Seasonal PF 04/20/2013, 03/27/2015  . Influenza,inj,Quad PF,36+ Mos 05/23/2014  . Pneumococcal Conjugate-13 04/24/2015  . Pneumococcal Polysaccharide-23 07/06/2009    Social History  Substance Use Topics  . Smoking status: Never Smoker   . Smokeless tobacco: Not on file  . Alcohol Use: No    Family history is  + HD , PVD  Review of Systems  DATA OBTAINED: from patient GENERAL:  no fevers, fatigue, appetite changes SKIN: No itching, rash or wounds EYES: No eye pain, redness, discharge EARS: No earache, tinnitus, change in hearing NOSE: No congestion, drainage or bleeding  MOUTH/THROAT: No mouth or tooth pain, No sore throat RESPIRATORY: No cough, wheezing, SOB CARDIAC: No chest pain, palpitations, lower extremity edema  GI: No abdominal pain, No N/V/D or constipation, No heartburn or reflux  GU: No dysuria, frequency or urgency, or incontinence  MUSCULOSKELETAL: No unrelieved bone/joint pain NEUROLOGIC: No headache, dizziness or focal weakness PSYCHIATRIC: No c/o anxiety or sadness   Filed Vitals:   09/20/15 1327  BP: 128/65  Pulse: 107  Temp: 98.2 F (36.8 C)  Resp: 18    SpO2 Readings from Last 1 Encounters:   09/20/15 96%        Physical Exam  GENERAL APPEARANCE: Alert, conversant,  No acute distress.  SKIN: No diaphoresis rash HEAD: Normocephalic, atraumatic  EYES: Conjunctiva/lids clear. Pupils round, reactive. EOMs intact.  EARS: External exam WNL, canals clear. Hearing grossly normal.  NOSE: No deformity or discharge.  MOUTH/THROAT: Lips w/o lesions  RESPIRATORY: Breathing is even, unlabored. Lung sounds are clear   CARDIOVASCULAR: Heart RRR no murmurs, rubs or gallops. trace peripheral edema.   GASTROINTESTINAL: Abdomen is soft, non-tender, not distended w/ normal bowel sounds. GENITOURINARY: Bladder non tender, not distended  MUSCULOSKELETAL: No abnormal joints or musculature NEUROLOGIC:  Cranial nerves 2-12 grossly intact. Moves all extremities  PSYCHIATRIC: Mood and affect appropriate to situation, no behavioral issues  Patient Active Problem List   Diagnosis Date Noted  . Abnormal chest x-ray 09/28/2015  . Edema of right  lower extremity 09/28/2015  . Chronic atrial fibrillation ([place]) 09/28/2015  . Altered mental status   . Elevated brain natriuretic peptide (BNP) level 09/17/2015  . Acute renal failure superimposed on stage 3 chronic kidney disease ([place]) 09/17/2015  . Diarrhea 09/17/2015  . Acute encephalopathy   . Hyperkalemia 09/16/2015  . Seizure ([place]) 09/16/2015  . Seizures ([place]) 09/16/2015  . Nausea with vomiting 09/16/2015  . Syncope 09/16/2015  . Renal insufficiency 09/12/2015  . Epistaxis 09/07/2015  . Supratherapeutic INR 08/30/2015  . CAP (community acquired pneumonia) 08/30/2015  . Asthma with exacerbation 08/14/2015  . PCP NOTES >>>>> 04/24/2015  . Hyperlipidemia 12/31/2014  . TIA (transient ischemic attack) 12/11/2014  . Dyslipidemia 12/11/2014  . History of CVA (cerebrovascular accident) 12/01/2014  . BP (high blood pressure) 12/01/2014  . Infection of urinary tract 12/01/2014  . Change in blood platelet count 12/01/2014  . Chronic renal  insufficiency, stage II (mild) 11/12/2013  . Impingement syndrome, shoulder, left 11/09/2013  . Acquired short bowel syndrome 11/09/2013  . Encounter for therapeutic drug monitoring 08/30/2013  . Hx pulmonary embolism 06/27/2013  . Warfarin anticoagulation 06/27/2013  . DDD (degenerative disc disease), lumbar 11/18/2012  . Mitral regurgitation 08/26/2011  . DYSPHAGIA UNSPECIFIED 07/31/2010  . ATRIAL FIBRILLATION WITH SLOW VENTRICULAR RESPONSE 07/08/2010  . COLON CANCER, HX OF 07/08/2010  . DYSPNEA/SHORTNESS OF BREATH 06/11/2010  . DIZZINESS 10/18/2009  . HYPOTENSION, ORTHOSTATIC 09/20/2009  . MUSCLE PAIN 08/23/2008  . Essential hypertension 12/16/2007  . EDEMA- LOCALIZED 12/16/2007  . HIP PAIN, CHRONIC 12/28/2006  . ANEMIA-NOS 07/15/2006  . Personal history of venous thrombosis and embolism 07/15/2006    CBC    Component Value Date/Time   WBC 4.7 09/19/2015 0440   WBC 5.0 06/09/2012 1509   RBC 3.65* 09/19/2015 0440   RBC 4.71 06/09/2012 1509   HGB 9.7* 09/19/2015 0440   HGB 15.2 06/09/2012 1509   HCT 32.8* 09/19/2015 0440   HCT 44.7 06/09/2012 1509   PLT 159 09/19/2015 0440   PLT 119* 06/09/2012 1509   MCV 89.9 09/19/2015 0440   MCV 94.9 06/09/2012 1509   LYMPHSABS 1.0 09/17/2015 0422   LYMPHSABS 1.5 06/09/2012 1509   MONOABS 0.5 09/17/2015 0422   MONOABS 0.5 06/09/2012 1509   EOSABS 0.0 09/17/2015 0422   EOSABS 0.1 06/09/2012 1509   BASOSABS 0.0 09/17/2015 0422   BASOSABS 0.0 06/09/2012 1509    CMP     Component Value Date/Time   NA 142 09/19/2015 0440   NA 142 06/09/2012 1509   K 4.8 09/19/2015 0440   K 3.9 06/09/2012 1509   CL 114* 09/19/2015 0440   CL 108* 06/09/2012 1509   CO2 20* 09/19/2015 0440   CO2 26 06/09/2012 1509   GLUCOSE 71 09/19/2015 0440   GLUCOSE 103* 06/09/2012 1509   BUN 17 09/19/2015 0440   BUN 17.0 06/09/2012 1509   CREATININE 1.56* 09/19/2015 0440   CREATININE 1.14* 11/18/2012 1700   CREATININE 1.2* 06/09/2012 1509   CALCIUM 8.0*  09/19/2015 0440   CALCIUM 9.5 06/09/2012 1509   PROT 6.7 09/16/2015 1537   PROT 7.2 06/09/2012 1509   ALBUMIN 3.0* 09/16/2015 1537   ALBUMIN 4.2 06/09/2012 1509   AST 29 09/16/2015 1537   AST 17 06/09/2012 1509   ALT 12* 09/16/2015 1537   ALT 11 06/09/2012 1509   ALKPHOS 97 09/16/2015 1537   ALKPHOS 47 06/09/2012 1509   BILITOT 0.8 09/16/2015 1537   BILITOT 0.73 06/09/2012 1509   GFRNONAA 29* 09/19/2015 0440  GFRAA 34* 09/19/2015 0440    Lab Results  Component Value Date   HGBA1C 5.6 11/13/2013     Dg Chest 2 View  09/16/2015  CLINICAL DATA:  Seizures.  Clinical concern for pneumonia. EXAM: CHEST  2 VIEW COMPARISON:  08/30/2015 chest radiograph. FINDINGS: Stable cardiomediastinal silhouette with mild cardiomegaly. No pneumothorax. No pleural effusion. Patchy consolidation in the lower lobes is not definitely changed from 08/30/2015. No pulmonary edema. No additional sites of consolidation. IMPRESSION: 1. Patchy bilateral lower lobe consolidation, not definitely changed from 08/30/2015, which could represent persistence of a prior pneumonia and/or superimposed new pneumonia. Recommend follow-up PA and lateral post treatment chest radiographs in 6-8 weeks. If the lower lobe opacities persist on follow-up chest radiographs, a chest CT would then be recommended for further evaluation. 2. Stable cardiomegaly without pulmonary edema. Electronically Signed   By: Ilona Sorrel M.D.   On: 09/16/2015 16:29   Ct Head Wo Contrast  09/16/2015  CLINICAL DATA:  Seizures EXAM: CT HEAD WITHOUT CONTRAST TECHNIQUE: Contiguous axial images were obtained from the base of the skull through the vertex without intravenous contrast. COMPARISON:  August 30, 2015 FINDINGS: Mild diffuse atrophy is stable. There is a stable left-sided posterior fossa extra-axial arachnoid cyst measuring 3.3 x 1.8 cm, stable. There is no evidence of intra-axial mass. There is no hemorrhage, subdural or epidural fluid collection, or  midline shift. There is patchy small vessel disease throughout the centra semiovale bilaterally. There is a prior small lacunar infarct in the posterior limb of the right external capsule. No new gray-white compartment lesions are identified. No acute infarct is evident. The bony calvarium appears intact. The mastoid air cells are clear. There are no intraorbital lesions apparent. There is a small retention cyst in the posterior left maxillary antrum. There is mild mucosal thickening in the inferior right maxillary antrum. There is polyposis in the anterior right naris, stable. There is leftward deviation of the nasal septum. IMPRESSION: Atrophy with supratentorial small vessel disease, stable. Prior small lacunar infarct in the right external capsule, stable. No acute infarct evident. No hemorrhage. No intra-axial mass. Stable posterior fossa extra-axial cyst on the left. Areas of paranasal sinus disease as well as anterior right nasal polyposis. Electronically Signed   By: Lowella Grip III M.D.   On: 09/16/2015 16:22   Ct Chest Wo Contrast  09/17/2015  CLINICAL DATA:  Pneumonia, shortness of breath on exertion for several months EXAM: CT CHEST WITHOUT CONTRAST TECHNIQUE: Multidetector CT imaging of the chest was performed following the standard protocol without IV contrast. COMPARISON:  09/16/2015 FINDINGS: Mediastinum/Nodes: Moderate cardiomegaly. No significant pericardial effusion. Extensive 3 vessel coronary artery calcification. No significant hilar or mediastinal adenopathy appreciated on this noncontrast study. Thoracic aorta shows calcification but no dilatation. There are secretions or reflux material within the esophagus. Lungs/Pleura: Not appreciated on comparison study, there is a small left and small to moderate right pleural effusion. There is dependent atelectasis in the right upper lobe. Mild reticular nodular opacity lateral inferior right middle lobe. The right lower lobe demonstrates  consolidation in the dependent portion most consistent with compressive atelectasis. On the left, minimal inferolateral lingular atelectasis is noted. There is dependent consolidation in the area of pleural effusion. Upper abdomen: No acute abnormalities Musculoskeletal: No acute findings IMPRESSION: Bilateral pleural effusions with underlying consolidation, with additional dependent consolidation in the posterior aspect of the right upper lobe. Airspace opacity is likely largely due to atelectasis although possibility of pneumonia on either side is not  excluded. Electronically Signed   By: Skipper Cliche M.D.   On: 09/17/2015 13:29   Mr Brain Wo Contrast  09/17/2015  CLINICAL DATA:  Two episodes of seizure like activity with unresponsiveness. History of atrial fibrillation, colon cancer status post resection, an stroke. EXAM: MRI HEAD WITHOUT CONTRAST TECHNIQUE: Multiplanar, multiecho pulse sequences of the brain and surrounding structures were obtained without intravenous contrast. COMPARISON:  Head CT 09/16/2015 FINDINGS: The patient was unable to tolerate the complete examination. Axial diffusion, FLAIR, and T2 and sagittal T1 weighted sequences were obtained. T1 images are moderately motion degraded, with mild motion artifact on the other sequences. There is no restricted diffusion to indicate acute infarct. No acute intracranial hemorrhage, mass, midline shift, or extra-axial fluid collection is identified. There is mild generalized cerebral atrophy. 3.3 x 2.2 cm arachnoid cyst is noted in the posterior fossa with mild flattening of the posteromedial left cerebellar hemisphere. Patchy T2 hyperintensities throughout the subcortical and deep cerebral white matter bilaterally are nonspecific but compatible with moderately advanced chronic small vessel ischemia. A chronic lacunar infarct is noted in the right lentiform nucleus. There is a slightly dilated perivascular space versus punctate chronic infarct in  the left lentiform nucleus. Mild chronic small vessel ischemic changes are noted in the brainstem. Prior bilateral cataract extraction is noted. No significant inflammatory changes seen in the paranasal sinuses are mastoid air cells. Major intracranial vascular flow voids are grossly preserved. IMPRESSION: 1. Motion degraded incomplete examination.  No acute infarct. 2. Moderately advanced chronic small vessel ischemic disease. Electronically Signed   By: Logan Bores M.D.   On: 09/17/2015 07:57   US Renal  09/16/2015  CLINICAL DATA:  Acute kidney injury EXAM: RENAL / URINARY TRACT ULTRASOUND COMPLETE COMPARISON:  08/08/2014 FINDINGS: Right Kidney: Length: 9.6 cm. Cortical thinning with increased echogenicity. No hydronephrosis. No evidence of mass Left Kidney: Length: 9 cm. Poorly visualized. Previously seen cystic mass is not resolved. No hydronephrosis. Bladder: Appears normal for degree of bladder distention. IMPRESSION: 1. No hydronephrosis. 2. Bilateral renal atrophy. Electronically Signed   By: Monte Fantasia M.D.   On: 09/16/2015 22:46    Not all labs, radiology exams or other studies done during hospitalization come through on my EPIC note; however they are reviewed by me.    Assessment and Plan  Acute renal failure superimposed on stage 3 chronic kidney disease (HCC) ua no infection, renal ultrasound no obstructive nephropathy, unclear etiology,likely from dehydration? Patient has been having diarrhea for several days.  Nephrology Dr Joelyn Oms consulted. Input appreciated. Renal function improving. Cr decrease at 1.5 SNF - Will resume lasix as per  discharge,  follow up.BMP   Hyperkalemia likely from acute renal failure, ( patient was on potassium supplement 11meq qd prior to admission was d/ed), received insulin/d50/calcium gluconate/sodium bicarb, repeat bmp. ekg no acute changes, continue tele. K trending down.  SNF - will f/u BMP  Seizure ([place]) Unresponsiveness: correct  metabolic disarray, MRI brain no acute findings, EEG negative for seizure. neurology input appreciated. Neurology following.  No need for AED.   Diarrhea improved. c diff, GI pathogen negative, resolved. Has history of short bowel syndrome.   Abnormal chest x-ray cxr with patchy bilateral lower lobe consolidation, not definitely changed from 2/24. On room air, no respiratory distress, Incentive spirometer. Echocardiogram normal ef. . Ct chest atelectasis , bilateral pleural effusion.  SNF - Will resume home dose lasix, BMP to follow renal function.   Edema of right lower extremity SNF doppler neg; encourage leg  elevation  Chronic atrial fibrillation (HCC) SNF rate controlled, on low pressor and verapamil chronically, prophylaxis with coumadin which will be actively titrated  Essential hypertension SNF - cntrolled on verapamil 120 mg BID, lopresor 25 BID and lasoix 40 mg daily  Hyperlipidemia SNF - last LDL 48, HDL 20, controled on lipitor 20 mg daily;plan - cont   Time spent > 45 min;> 50% of time with patient was spent reviewing records, labs, tests and studies, counseling and developing plan of care  Inocencio Homes, MD

## 2015-09-25 ENCOUNTER — Non-Acute Institutional Stay (SKILLED_NURSING_FACILITY): Payer: Medicare Other | Admitting: Internal Medicine

## 2015-09-25 ENCOUNTER — Encounter: Payer: Self-pay | Admitting: Internal Medicine

## 2015-09-25 DIAGNOSIS — R0989 Other specified symptoms and signs involving the circulatory and respiratory systems: Secondary | ICD-10-CM

## 2015-09-25 DIAGNOSIS — R059 Cough, unspecified: Secondary | ICD-10-CM

## 2015-09-25 DIAGNOSIS — R05 Cough: Secondary | ICD-10-CM | POA: Diagnosis not present

## 2015-09-28 ENCOUNTER — Encounter: Payer: Self-pay | Admitting: Internal Medicine

## 2015-09-28 DIAGNOSIS — R9389 Abnormal findings on diagnostic imaging of other specified body structures: Secondary | ICD-10-CM | POA: Insufficient documentation

## 2015-09-28 DIAGNOSIS — R600 Localized edema: Secondary | ICD-10-CM | POA: Insufficient documentation

## 2015-09-28 NOTE — Assessment & Plan Note (Signed)
ua no infection, renal ultrasound no obstructive nephropathy, unclear etiology,likely from dehydration? Patient has been having diarrhea for several days.  Nephrology Dr Joelyn Oms consulted. Input appreciated. Renal function improving. Cr decrease at 1.5 SNF - Will resume lasix as per  discharge,  follow up.BMP

## 2015-09-28 NOTE — Assessment & Plan Note (Signed)
cxr with patchy bilateral lower lobe consolidation, not definitely changed from 2/24. On room air, no respiratory distress, Incentive spirometer. Echocardiogram normal ef. . Ct chest atelectasis , bilateral pleural effusion.  SNF - Will resume home dose lasix, BMP to follow renal function.

## 2015-09-28 NOTE — Assessment & Plan Note (Signed)
SNF - last LDL 48, HDL 20, controled on lipitor 20 mg daily;plan - cont

## 2015-09-28 NOTE — Assessment & Plan Note (Signed)
SNF doppler neg; encourage leg elevation

## 2015-09-28 NOTE — Assessment & Plan Note (Signed)
likely from acute renal failure, ( patient was on potassium supplement 75meq qd prior to admission was d/ed), received insulin/d50/calcium gluconate/sodium bicarb, repeat bmp. ekg no acute changes, continue tele. K trending down.  SNF - will f/u BMP

## 2015-09-28 NOTE — Assessment & Plan Note (Signed)
SNF - cntrolled on verapamil 120 mg BID, lopresor 25 BID and lasoix 40 mg daily

## 2015-09-28 NOTE — Assessment & Plan Note (Signed)
improved. c diff, GI pathogen negative, resolved. Has history of short bowel syndrome.

## 2015-09-28 NOTE — Assessment & Plan Note (Signed)
SNF rate controlled, on low pressor and verapamil chronically, prophylaxis with coumadin which will be actively titrated

## 2015-09-28 NOTE — Assessment & Plan Note (Signed)
Unresponsiveness: correct metabolic disarray, MRI brain no acute findings, EEG negative for seizure. neurology input appreciated. Neurology following.  No need for AED.

## 2015-09-29 DIAGNOSIS — R059 Cough, unspecified: Secondary | ICD-10-CM | POA: Insufficient documentation

## 2015-09-29 DIAGNOSIS — R05 Cough: Secondary | ICD-10-CM | POA: Insufficient documentation

## 2015-09-29 NOTE — Progress Notes (Signed)
Patient ID: [name], female   DOB: [birth year], 82 y.o.   MRN: [id number]  MRN: [id number] Name: [name]  Sex: female Age: 82 y.o. DOB: [birth year]  [place] #: [name]  Facility/Room: 110 P Level Of Care: SNF Provider: [name] Emergency Contacts: Extended Emergency Contact Information Primary Emergency Contact: [name] Address: [address]          [place], [place] [address] [name] of [place] Phone: [phone] Mobile Phone: [phone] Relation: Daughter  Code Status:   Allergies: Review of patient's allergies indicates no known allergies.  Chief Complaint  Patient presents with  . Acute Visit    Cough; Chest congestion    HPI: Patient is 82 y.o. female with HTN, HLD, atrial fibrillation on anticoagulation, colon cancer s/p resection with short gut syndrome, CVA, DVT/PE; Recently hospitalized for acute kidney failure-she has responded very well in hospital treatment and has returned the facility.  She has developed apparently some cough with chest congestion Dr. [name] did start Robitussin-as well as Claritin for concerns of allergic rhinitis  g.  Past Medical History  Diagnosis Date  . Anemia   . DVT (deep venous thrombosis) ([place]) 11/06  . Pulmonary embolism ([place]) 11/06  . Colon cancer ([place]) 12/11  . Hypertension   . Atrial fibrillation ([place])   . Frequent headaches   . Heart murmur   . Stroke ([place]) 11/30/2014  . Colon cancer ([place]) 2011  . Acute renal failure superimposed on stage 3 chronic kidney disease ([place])   . Seizures ([place])   . Hyperkalemia   . Elevated brain natriuretic peptide (BNP) level   . Diarrhea   . Acute encephalopathy     Past Surgical History  Procedure Laterality Date  . Appendectomy  1952  . Total hip arthroplasty  01/03/07    Left hip replacement.  . Colectomy  06/2010    partial, Dr. Donne Hazel complicated by LLE CVT; S/P IVC umbrella & anemia  . Total abdominal hysterectomy w/ bilateral  salpingoophorectomy  1973    For Fibroids  . Vena cava filter placement  06/28/2010  . Hip fracture surgery  2006    Trauma      Medication List       This list is accurate as of: 09/25/15 11:59 PM.  Always use your most recent med list.               acetaminophen 500 MG tablet  Commonly known as:  TYLENOL  Take 500 mg by mouth every 8 (eight) hours as needed for mild pain.     albuterol 108 (90 Base) MCG/ACT inhaler  Commonly known as:  VENTOLIN HFA  Inhale 2 puffs into the lungs every 6 (six) hours as needed for wheezing or shortness of breath.     ALPRAZolam 0.25 MG tablet  Commonly known as:  XANAX  Take 1 tablet (0.25 mg total) by mouth 2 (two) times daily as needed for anxiety.     aspirin 81 MG tablet  Take 81 mg by mouth daily.     atorvastatin 20 MG tablet  Commonly known as:  LIPITOR  Take 1 tablet (20 mg total) by mouth daily.     bisacodyl 10 MG suppository  Commonly known as:  DULCOLAX  Place 10 mg rectally as needed for moderate constipation.     CALCIUM 1200 PO  Take 2 tablets by mouth daily.     fentaNYL 50 MCG/HR  Commonly known as:  DURAGESIC - dosed  mcg/hr  Place 1 patch (50 mcg total) onto the skin every 3 (three) days.     Fluticasone-Salmeterol 100-50 MCG/DOSE Aepb  Commonly known as:  ADVAIR DISKUS  Inhale 1 puff into the lungs 2 (two) times daily.     furosemide 40 MG tablet  Commonly known as:  LASIX  Take 1 tablet (40 mg total) by mouth every Monday, Wednesday, and Friday.     loperamide 2 MG tablet  Commonly known as:  IMODIUM A-D  Give 4 mg by mouth with first loose stool. Give 2 mg by mouth for additional loose stool.     loratadine 10 MG tablet  Commonly known as:  CLARITIN  Take 10 mg by mouth daily.     magnesium hydroxide 400 MG/5ML suspension  Commonly known as:  MILK OF MAGNESIA  Take 30 mLs by mouth daily as needed for moderate constipation.     metoprolol tartrate 25 MG tablet  Commonly known as:  LOPRESSOR   Take 1 tablet (25 mg total) by mouth 2 (two) times daily.     MULTIPLE VITAMINS/WOMENS PO  Take by mouth daily.     potassium chloride 10 MEQ tablet  Commonly known as:  K-DUR  Take 10 mEq by mouth daily.     promethazine 25 MG/ML injection  Commonly known as:  PHENERGAN  Give 25 mg IM every 6 hours (up to  3 doses) prn.     traMADol 50 MG tablet  Commonly known as:  ULTRAM  Take 1 tablet (50 mg total) by mouth every 6 (six) hours as needed. for pain     verapamil 120 MG CR tablet  Commonly known as:  CALAN-SR  Take 120 mg by mouth 2 (two) times daily.     warfarin 2 MG tablet  Commonly known as:  COUMADIN  Take 2 mg by mouth daily.       Current Outpatient Prescriptions on File Prior to Visit  Medication Sig Dispense Refill  . acetaminophen (TYLENOL) 500 MG tablet Take 500 mg by mouth every 8 (eight) hours as needed for mild pain.     Marland Kitchen albuterol (VENTOLIN HFA) 108 (90 Base) MCG/ACT inhaler Inhale 2 puffs into the lungs every 6 (six) hours as needed for wheezing or shortness of breath. 1 Inhaler 1  . ALPRAZolam (XANAX) 0.25 MG tablet Take 1 tablet (0.25 mg total) by mouth 2 (two) times daily as needed for anxiety. 60 tablet 3  . atorvastatin (LIPITOR) 20 MG tablet Take 1 tablet (20 mg total) by mouth daily. 90 tablet 0  . bisacodyl (DULCOLAX) 10 MG suppository Place 10 mg rectally as needed for moderate constipation.    . Calcium Carbonate-Vit D-Min (CALCIUM 1200 PO) Take 2 tablets by mouth daily.     . fentaNYL (DURAGESIC - DOSED MCG/HR) 50 MCG/HR Place 1 patch (50 mcg total) onto the skin every 3 (three) days. 5 patch 0  . Fluticasone-Salmeterol (ADVAIR DISKUS) 100-50 MCG/DOSE AEPB Inhale 1 puff into the lungs 2 (two) times daily. 60 each 5  . furosemide (LASIX) 40 MG tablet Take 1 tablet (40 mg total) by mouth every Monday, Wednesday, and Friday. (Patient taking differently: Take 40 mg by mouth daily. ) 30 tablet 0  . magnesium hydroxide (MILK OF MAGNESIA) 400 MG/5ML  suspension Take 30 mLs by mouth daily as needed for moderate constipation.    . metoprolol tartrate (LOPRESSOR) 25 MG tablet Take 1 tablet (25 mg total) by mouth 2 (two) times daily. 180 tablet  1  . Multiple Vitamins-Minerals (MULTIPLE VITAMINS/WOMENS PO) Take by mouth daily.    . traMADol (ULTRAM) 50 MG tablet Take 1 tablet (50 mg total) by mouth every 6 (six) hours as needed. for pain 30 tablet 3   No current facility-administered medications on file prior to visit.   +  Meds ordered this encounter  Medications  . loperamide (IMODIUM A-D) 2 MG tablet    Sig: Give 4 mg by mouth with first loose stool. Give 2 mg by mouth for additional loose stool.  Marland Kitchen loratadine (CLARITIN) 10 MG tablet    Sig: Take 10 mg by mouth daily.  Marland Kitchen warfarin (COUMADIN) 2 MG tablet    Sig: Take 2 mg by mouth daily.  . promethazine (PHENERGAN) 25 MG/ML injection    Sig: Give 25 mg IM every 6 hours (up to  3 doses) prn.  . aspirin 81 MG tablet    Sig: Take 81 mg by mouth daily.  . potassium chloride (K-DUR) 10 MEQ tablet    Sig: Take 10 mEq by mouth daily.  . verapamil (CALAN-SR) 120 MG CR tablet    Sig: Take 120 mg by mouth 2 (two) times daily.    Immunization History  Administered Date(s) Administered  . Influenza Split 03/30/2012  . Influenza Whole 04/30/2006, 04/20/2008, 04/15/2009  . Influenza, High Dose Seasonal PF 04/20/2013, 03/27/2015  . Influenza,inj,Quad PF,36+ Mos 05/23/2014  . Pneumococcal Conjugate-13 04/24/2015  . Pneumococcal Polysaccharide-23 07/06/2009    Social History  Substance Use Topics  . Smoking status: Never Smoker   . Smokeless tobacco: Not on file  . Alcohol Use: No    Family history is  + HD , PVD  Review of Systems  DATA OBTAINED: from patientSays she is feeling better GENERAL:  no fevers, fatigue, appetite changes SKIN: No itching, rash or wounds EYES: No eye pain, redness, discharge EARS: No earache, tinnitus, change in hearing NOSE: No congestion, drainage or  bleeding  MOUTH/THROAT: No mouth or tooth pain, No sore throat RESPIRATORY: Has a cough but is not complaining of increased shortness of breath CARDIAC: No chest pain, palpitations, lower extremity edema  GI: No abdominal pain, No N/V/D or constipation, No heartburn or reflux  GU: No dysuria, frequency or urgency, or incontinence  MUSCULOSKELETAL: No unrelieved bone/joint pain NEUROLOGIC: No headache, dizziness or focal weakness PSYCHIATRIC: No c/o anxiety or sadness   Filed Vitals:   09/25/15 1130  BP: 148/81  Pulse: 97  Temp: 97.3 F (36.3 C)  Resp: 18    SpO2 Readings from Last 1 Encounters:  09/20/15 96%        Physical Exam  GENERAL APPEARANCE: Alert, conversant,  No acute distress SKIN: No diaphoresis rash HEAD: Normocephalic, atraumatic  EYES: Conjunctiva/lids clear. Pupils round, reactive. EOMs intact.  EARS: External exam WNL, canals clear. Hearing grossly normal.  NOSE: No deformity or discharge.  MOUTH/THROAT: Oropharynx clear mucous membranes moist RESPIRATORY: Has some diffuse rhonchi there is no labored breathing   CARDIOVASCULAR: Heart Regular,irregular   no murmurs, rubs or gallops. trace peripheral edema. Right greater than left   GASTROINTESTINAL: Abdomen is soft, non-tender, not distended w/ normal bowel sounds. GENITOURINARY: Bladder non tender, not distended  MUSCULOSKELETAL: No abnormal joints or musculature Y frail but appears stronger than when I last saw her  NEUROLOGIC:  Cranial nerves 2-12 grossly intact. Moves all extremities  PSYCHIATRIC: Mood and affect appropriate to situation, no behavioral issues  Patient Active Problem List   Diagnosis Date Noted  . Cough  09/29/2015  . Abnormal chest x-ray 09/28/2015  . Edema of right lower extremity 09/28/2015  . Chronic atrial fibrillation ([place]) 09/28/2015  . Altered mental status   . Elevated brain natriuretic peptide (BNP) level 09/17/2015  . Acute renal failure superimposed on stage 3 chronic  kidney disease ([place]) 09/17/2015  . Diarrhea 09/17/2015  . Acute encephalopathy   . Hyperkalemia 09/16/2015  . Seizure ([place]) 09/16/2015  . Seizures ([place]) 09/16/2015  . Nausea with vomiting 09/16/2015  . Syncope 09/16/2015  . Renal insufficiency 09/12/2015  . Epistaxis 09/07/2015  . Supratherapeutic INR 08/30/2015  . CAP (community acquired pneumonia) 08/30/2015  . Asthma with exacerbation 08/14/2015  . PCP NOTES >>>>> 04/24/2015  . Hyperlipidemia 12/31/2014  . TIA (transient ischemic attack) 12/11/2014  . Dyslipidemia 12/11/2014  . History of CVA (cerebrovascular accident) 12/01/2014  . BP (high blood pressure) 12/01/2014  . Infection of urinary tract 12/01/2014  . Change in blood platelet count 12/01/2014  . Chronic renal insufficiency, stage II (mild) 11/12/2013  . Impingement syndrome, shoulder, left 11/09/2013  . Acquired short bowel syndrome 11/09/2013  . Encounter for therapeutic drug monitoring 08/30/2013  . Hx pulmonary embolism 06/27/2013  . Warfarin anticoagulation 06/27/2013  . DDD (degenerative disc disease), lumbar 11/18/2012  . Mitral regurgitation 08/26/2011  . DYSPHAGIA UNSPECIFIED 07/31/2010  . ATRIAL FIBRILLATION WITH SLOW VENTRICULAR RESPONSE 07/08/2010  . COLON CANCER, HX OF 07/08/2010  . DYSPNEA/SHORTNESS OF BREATH 06/11/2010  . DIZZINESS 10/18/2009  . HYPOTENSION, ORTHOSTATIC 09/20/2009  . MUSCLE PAIN 08/23/2008  . Essential hypertension 12/16/2007  . EDEMA- LOCALIZED 12/16/2007  . HIP PAIN, CHRONIC 12/28/2006  . ANEMIA-NOS 07/15/2006  . Personal history of venous thrombosis and embolism 07/15/2006     Labs.  09/25/2015.  Sodium 140 potassium 4.2 BUN 6 creatinine 0.82 CO2 level XXIII CBC    Component Value Date/Time   WBC 4.7 09/19/2015 0440   WBC 5.0 06/09/2012 1509   RBC 3.65* 09/19/2015 0440   RBC 4.71 06/09/2012 1509   HGB 9.7* 09/19/2015 0440   HGB 15.2 06/09/2012 1509   HCT 32.8* 09/19/2015 0440   HCT 44.7 06/09/2012 1509   PLT  159 09/19/2015 0440   PLT 119* 06/09/2012 1509   MCV 89.9 09/19/2015 0440   MCV 94.9 06/09/2012 1509   LYMPHSABS 1.0 09/17/2015 0422   LYMPHSABS 1.5 06/09/2012 1509   MONOABS 0.5 09/17/2015 0422   MONOABS 0.5 06/09/2012 1509   EOSABS 0.0 09/17/2015 0422   EOSABS 0.1 06/09/2012 1509   BASOSABS 0.0 09/17/2015 0422   BASOSABS 0.0 06/09/2012 1509    CMP     Component Value Date/Time   NA 142 09/19/2015 0440   NA 142 06/09/2012 1509   K 4.8 09/19/2015 0440   K 3.9 06/09/2012 1509   CL 114* 09/19/2015 0440   CL 108* 06/09/2012 1509   CO2 20* 09/19/2015 0440   CO2 26 06/09/2012 1509   GLUCOSE 71 09/19/2015 0440   GLUCOSE 103* 06/09/2012 1509   BUN 17 09/19/2015 0440   BUN 17.0 06/09/2012 1509   CREATININE 1.56* 09/19/2015 0440   CREATININE 1.14* 11/18/2012 1700   CREATININE 1.2* 06/09/2012 1509   CALCIUM 8.0* 09/19/2015 0440   CALCIUM 9.5 06/09/2012 1509   PROT 6.7 09/16/2015 1537   PROT 7.2 06/09/2012 1509   ALBUMIN 3.0* 09/16/2015 1537   ALBUMIN 4.2 06/09/2012 1509   AST 29 09/16/2015 1537   AST 17 06/09/2012 1509   ALT 12* 09/16/2015 1537   ALT 11 06/09/2012 1509   ALKPHOS  97 09/16/2015 1537   ALKPHOS 47 06/09/2012 1509   BILITOT 0.8 09/16/2015 1537   BILITOT 0.73 06/09/2012 1509   GFRNONAA 29* 09/19/2015 0440   GFRAA 34* 09/19/2015 0440    Lab Results  Component Value Date   HGBA1C 5.6 11/13/2013     Dg Chest 2 View  09/16/2015  CLINICAL DATA:  Seizures.  Clinical concern for pneumonia. EXAM: CHEST  2 VIEW COMPARISON:  08/30/2015 chest radiograph. FINDINGS: Stable cardiomediastinal silhouette with mild cardiomegaly. No pneumothorax. No pleural effusion. Patchy consolidation in the lower lobes is not definitely changed from 08/30/2015. No pulmonary edema. No additional sites of consolidation. IMPRESSION: 1. Patchy bilateral lower lobe consolidation, not definitely changed from 08/30/2015, which could represent persistence of a prior pneumonia and/or superimposed  new pneumonia. Recommend follow-up PA and lateral post treatment chest radiographs in 6-8 weeks. If the lower lobe opacities persist on follow-up chest radiographs, a chest CT would then be recommended for further evaluation. 2. Stable cardiomegaly without pulmonary edema. Electronically Signed   By: Ilona Sorrel M.D.   On: 09/16/2015 16:29   Ct Head Wo Contrast  09/16/2015  CLINICAL DATA:  Seizures EXAM: CT HEAD WITHOUT CONTRAST TECHNIQUE: Contiguous axial images were obtained from the base of the skull through the vertex without intravenous contrast. COMPARISON:  August 30, 2015 FINDINGS: Mild diffuse atrophy is stable. There is a stable left-sided posterior fossa extra-axial arachnoid cyst measuring 3.3 x 1.8 cm, stable. There is no evidence of intra-axial mass. There is no hemorrhage, subdural or epidural fluid collection, or midline shift. There is patchy small vessel disease throughout the centra semiovale bilaterally. There is a prior small lacunar infarct in the posterior limb of the right external capsule. No new gray-white compartment lesions are identified. No acute infarct is evident. The bony calvarium appears intact. The mastoid air cells are clear. There are no intraorbital lesions apparent. There is a small retention cyst in the posterior left maxillary antrum. There is mild mucosal thickening in the inferior right maxillary antrum. There is polyposis in the anterior right naris, stable. There is leftward deviation of the nasal septum. IMPRESSION: Atrophy with supratentorial small vessel disease, stable. Prior small lacunar infarct in the right external capsule, stable. No acute infarct evident. No hemorrhage. No intra-axial mass. Stable posterior fossa extra-axial cyst on the left. Areas of paranasal sinus disease as well as anterior right nasal polyposis. Electronically Signed   By: Lowella Grip III M.D.   On: 09/16/2015 16:22   Ct Chest Wo Contrast  09/17/2015  CLINICAL DATA:   Pneumonia, shortness of breath on exertion for several months EXAM: CT CHEST WITHOUT CONTRAST TECHNIQUE: Multidetector CT imaging of the chest was performed following the standard protocol without IV contrast. COMPARISON:  09/16/2015 FINDINGS: Mediastinum/Nodes: Moderate cardiomegaly. No significant pericardial effusion. Extensive 3 vessel coronary artery calcification. No significant hilar or mediastinal adenopathy appreciated on this noncontrast study. Thoracic aorta shows calcification but no dilatation. There are secretions or reflux material within the esophagus. Lungs/Pleura: Not appreciated on comparison study, there is a small left and small to moderate right pleural effusion. There is dependent atelectasis in the right upper lobe. Mild reticular nodular opacity lateral inferior right middle lobe. The right lower lobe demonstrates consolidation in the dependent portion most consistent with compressive atelectasis. On the left, minimal inferolateral lingular atelectasis is noted. There is dependent consolidation in the area of pleural effusion. Upper abdomen: No acute abnormalities Musculoskeletal: No acute findings IMPRESSION: Bilateral pleural effusions with underlying consolidation, with  additional dependent consolidation in the posterior aspect of the right upper lobe. Airspace opacity is likely largely due to atelectasis although possibility of pneumonia on either side is not excluded. Electronically Signed   By: Skipper Cliche M.D.   On: 09/17/2015 13:29   Mr Brain Wo Contrast  09/17/2015  CLINICAL DATA:  Two episodes of seizure like activity with unresponsiveness. History of atrial fibrillation, colon cancer status post resection, an stroke. EXAM: MRI HEAD WITHOUT CONTRAST TECHNIQUE: Multiplanar, multiecho pulse sequences of the brain and surrounding structures were obtained without intravenous contrast. COMPARISON:  Head CT 09/16/2015 FINDINGS: The patient was unable to tolerate the complete  examination. Axial diffusion, FLAIR, and T2 and sagittal T1 weighted sequences were obtained. T1 images are moderately motion degraded, with mild motion artifact on the other sequences. There is no restricted diffusion to indicate acute infarct. No acute intracranial hemorrhage, mass, midline shift, or extra-axial fluid collection is identified. There is mild generalized cerebral atrophy. 3.3 x 2.2 cm arachnoid cyst is noted in the posterior fossa with mild flattening of the posteromedial left cerebellar hemisphere. Patchy T2 hyperintensities throughout the subcortical and deep cerebral white matter bilaterally are nonspecific but compatible with moderately advanced chronic small vessel ischemia. A chronic lacunar infarct is noted in the right lentiform nucleus. There is a slightly dilated perivascular space versus punctate chronic infarct in the left lentiform nucleus. Mild chronic small vessel ischemic changes are noted in the brainstem. Prior bilateral cataract extraction is noted. No significant inflammatory changes seen in the paranasal sinuses are mastoid air cells. Major intracranial vascular flow voids are grossly preserved. IMPRESSION: 1. Motion degraded incomplete examination.  No acute infarct. 2. Moderately advanced chronic small vessel ischemic disease. Electronically Signed   By: Logan Bores M.D.   On: 09/17/2015 07:57   US Renal  09/16/2015  CLINICAL DATA:  Acute kidney injury EXAM: RENAL / URINARY TRACT ULTRASOUND COMPLETE COMPARISON:  08/08/2014 FINDINGS: Right Kidney: Length: 9.6 cm. Cortical thinning with increased echogenicity. No hydronephrosis. No evidence of mass Left Kidney: Length: 9 cm. Poorly visualized. Previously seen cystic mass is not resolved. No hydronephrosis. Bladder: Appears normal for degree of bladder distention. IMPRESSION: 1. No hydronephrosis. 2. Bilateral renal atrophy. Electronically Signed   By: Monte Fantasia M.D.   On: 09/16/2015 22:46    Not all labs, radiology  exams or other studies done during hospitalization come through on my EPIC note; however they are reviewed by me.    Assessment and Plan   Cough with congestion-will order a chest x-ray two-view-continue Robitussin as well as Claritin-will add nebulizers Atrovent nebs every 6 hours routine 48 hours then when necessary.  She does not appear to be in any distress and appears to certainly have gained strength from when I last saw her prior to her most recent hospitalization-nonetheless she is a fragile individual she will have to be monitored closely  CPT  99309-greater than 25 minutes spent assessing patient-discussing her status with nursing staff-reviewing her chart-and coordinating plan a care-of note greater than 50% of time spent coordinating  a plan of care with chart review and discussion with nursing

## 2015-09-30 ENCOUNTER — Non-Acute Institutional Stay (SKILLED_NURSING_FACILITY): Payer: Medicare Other | Admitting: Internal Medicine

## 2015-09-30 DIAGNOSIS — L03115 Cellulitis of right lower limb: Secondary | ICD-10-CM | POA: Diagnosis not present

## 2015-09-30 DIAGNOSIS — N179 Acute kidney failure, unspecified: Secondary | ICD-10-CM | POA: Diagnosis not present

## 2015-09-30 DIAGNOSIS — Z7901 Long term (current) use of anticoagulants: Secondary | ICD-10-CM

## 2015-09-30 NOTE — Progress Notes (Signed)
Patient ID: [name], female   DOB: [birth year], 82 y.o.   MRN: [place]:[id number]   MRN: [place]:[id number] Name: [name]  Sex: female Age: 82 y.o. DOB: 1927/01/20  [place] #: [name]  Facility/Room: 110 P Level Of Care: SNF Provider: [name] Emergency Contacts: Extended Emergency Contact Information Primary Emergency Contact: [name],[name] Address: [address]          [place], [place] [address] [name] of [place] Phone: [phone] Mobile Phone: [phone] Relation: Daughter  Code Status:   Allergies: Review of patient's allergies indicates no known allergies.  Chief Complaint  Patient presents with  . Acute Visit  Secondary to right leg erythema  HPI: Patient is 82 y.o. female with HTN, HLD, atrial fibrillation on anticoagulation, colon cancer s/p resection with short gut syndrome, CVA, DVT/PE; Recently hospitalized for acute kidney failure-she has responded very well in hospital treatment and has returned the facility.  She has developed apparently some cough with chest congestion Dr. [name] did start Robitussin-as well as Claritin for concerns of allergic rhinitis we did add nebulizers and this appears to be resolving-does not complain of significant cough or congestion today-.  He has developed some increased erythema of her right leg however and apparently does have a history of his cellulitis here.  He does have a previous history of DVT although she is had several Dopplers recently which have been negative for any acute DVT-she is on Coumadin for anticoagulation INR today is slightly subtherapeutic at 1.8 she is on 2.5 mg of Coumadin a day.  Currently she does not complain of any fever or chills appears to be doing relatively well although she is a very frail lady  g.  Past Medical History  Diagnosis Date  . Anemia   . DVT (deep venous thrombosis) ([place]) 11/06  . Pulmonary embolism ([place]) 11/06  . Colon cancer ([place]) 12/11  . Hypertension    . Atrial fibrillation ([place])   . Frequent headaches   . Heart murmur   . Stroke ([place]) 11/30/2014  . Colon cancer ([place]) 2011  . Acute renal failure superimposed on stage 3 chronic kidney disease ([place])   . Seizures ([place])   . Hyperkalemia   . Elevated brain natriuretic peptide (BNP) level   . Diarrhea   . Acute encephalopathy     Past Surgical History  Procedure Laterality Date  . Appendectomy  1952  . Total hip arthroplasty  01/03/07    Left hip replacement.  . Colectomy  06/2010    partial, Dr. Donne Hazel complicated by LLE CVT; S/P IVC umbrella & anemia  . Total abdominal hysterectomy w/ bilateral salpingoophorectomy  1973    For Fibroids  . Vena cava filter placement  06/28/2010  . Hip fracture surgery  2006    Trauma      Medication List       This list is accurate as of: 09/30/15 11:59 PM.  Always use your most recent med list.               acetaminophen 500 MG tablet  Commonly known as:  TYLENOL  Take 500 mg by mouth every 8 (eight) hours as needed for mild pain.     albuterol 108 (90 Base) MCG/ACT inhaler  Commonly known as:  VENTOLIN HFA  Inhale 2 puffs into the lungs every 6 (six) hours as needed for wheezing or shortness of breath.     ALPRAZolam 0.25 MG tablet  Commonly known as:  Duanne Moron  Take 1 tablet (0.25 mg total) by mouth 2 (two) times daily as needed for anxiety.     aspirin 81 MG tablet  Take 81 mg by mouth daily.     atorvastatin 20 MG tablet  Commonly known as:  LIPITOR  Take 1 tablet (20 mg total) by mouth daily.     bisacodyl 10 MG suppository  Commonly known as:  DULCOLAX  Place 10 mg rectally as needed for moderate constipation.     CALCIUM 1200 PO  Take 2 tablets by mouth daily.     fentaNYL 50 MCG/HR  Commonly known as:  DURAGESIC - dosed mcg/hr  Place 1 patch (50 mcg total) onto the skin every 3 (three) days.     Fluticasone-Salmeterol 100-50 MCG/DOSE Aepb  Commonly known as:  ADVAIR DISKUS  Inhale 1 puff into the lungs 2  (two) times daily.     furosemide 40 MG tablet  Commonly known as:  LASIX  Take 1 tablet (40 mg total) by mouth every Monday, Wednesday, and Friday.     loperamide 2 MG tablet  Commonly known as:  IMODIUM A-D  Give 4 mg by mouth with first loose stool. Give 2 mg by mouth for additional loose stool.     loratadine 10 MG tablet  Commonly known as:  CLARITIN  Take 10 mg by mouth daily.     magnesium hydroxide 400 MG/5ML suspension  Commonly known as:  MILK OF MAGNESIA  Take 30 mLs by mouth daily as needed for moderate constipation.     metoprolol tartrate 25 MG tablet  Commonly known as:  LOPRESSOR  Take 1 tablet (25 mg total) by mouth 2 (two) times daily.     MULTIPLE VITAMINS/WOMENS PO  Take by mouth daily.     potassium chloride 10 MEQ tablet  Commonly known as:  K-DUR  Take 10 mEq by mouth daily.     promethazine 25 MG/ML injection  Commonly known as:  PHENERGAN  Give 25 mg IM every 6 hours (up to  3 doses) prn.     traMADol 50 MG tablet  Commonly known as:  ULTRAM  Take 1 tablet (50 mg total) by mouth every 6 (six) hours as needed. for pain     verapamil 120 MG CR tablet  Commonly known as:  CALAN-SR  Take 120 mg by mouth 2 (two) times daily.     warfarin 2 MG tablet  Commonly known as:  COUMADIN  Take 2 mg by mouth daily.       Current Outpatient Prescriptions on File Prior to Visit  Medication Sig Dispense Refill  . acetaminophen (TYLENOL) 500 MG tablet Take 500 mg by mouth every 8 (eight) hours as needed for mild pain.     Marland Kitchen albuterol (VENTOLIN HFA) 108 (90 Base) MCG/ACT inhaler Inhale 2 puffs into the lungs every 6 (six) hours as needed for wheezing or shortness of breath. 1 Inhaler 1  . ALPRAZolam (XANAX) 0.25 MG tablet Take 1 tablet (0.25 mg total) by mouth 2 (two) times daily as needed for anxiety. 60 tablet 3  . aspirin 81 MG tablet Take 81 mg by mouth daily.    Marland Kitchen atorvastatin (LIPITOR) 20 MG tablet Take 1 tablet (20 mg total) by mouth daily. 90 tablet 0   . bisacodyl (DULCOLAX) 10 MG suppository Place 10 mg rectally as needed for moderate constipation.    . Calcium Carbonate-Vit D-Min (CALCIUM 1200 PO) Take 2 tablets by mouth daily.     Marland Kitchen  fentaNYL (DURAGESIC - DOSED MCG/HR) 50 MCG/HR Place 1 patch (50 mcg total) onto the skin every 3 (three) days. 5 patch 0  . Fluticasone-Salmeterol (ADVAIR DISKUS) 100-50 MCG/DOSE AEPB Inhale 1 puff into the lungs 2 (two) times daily. 60 each 5  . furosemide (LASIX) 40 MG tablet Take 1 tablet (40 mg total) by mouth every Monday, Wednesday, and Friday. (Patient taking differently: Take 40 mg by mouth daily. ) 30 tablet 0  . loperamide (IMODIUM A-D) 2 MG tablet Give 4 mg by mouth with first loose stool. Give 2 mg by mouth for additional loose stool.    Marland Kitchen loratadine (CLARITIN) 10 MG tablet Take 10 mg by mouth daily.    . magnesium hydroxide (MILK OF MAGNESIA) 400 MG/5ML suspension Take 30 mLs by mouth daily as needed for moderate constipation.    . metoprolol tartrate (LOPRESSOR) 25 MG tablet Take 1 tablet (25 mg total) by mouth 2 (two) times daily. 180 tablet 1  . Multiple Vitamins-Minerals (MULTIPLE VITAMINS/WOMENS PO) Take by mouth daily.    . potassium chloride (K-DUR) 10 MEQ tablet Take 10 mEq by mouth daily.    . promethazine (PHENERGAN) 25 MG/ML injection Give 25 mg IM every 6 hours (up to  3 doses) prn.    . traMADol (ULTRAM) 50 MG tablet Take 1 tablet (50 mg total) by mouth every 6 (six) hours as needed. for pain 30 tablet 3  . verapamil (CALAN-SR) 120 MG CR tablet Take 120 mg by mouth 2 (two) times daily.    Marland Kitchen warfarin (COUMADIN) 2 MG tablet Take 2 mg by mouth daily.     No current facility-administered medications on file prior to visit.   +  No orders of the defined types were placed in this encounter.    Immunization History  Administered Date(s) Administered  . Influenza Split 03/30/2012  . Influenza Whole 04/30/2006, 04/20/2008, 04/15/2009  . Influenza, High Dose Seasonal PF 04/20/2013,  03/27/2015  . Influenza,inj,Quad PF,36+ Mos 05/23/2014  . Pneumococcal Conjugate-13 04/24/2015  . Pneumococcal Polysaccharide-23 07/06/2009    Social History  Substance Use Topics  . Smoking status: Never Smoker   . Smokeless tobacco: Not on file  . Alcohol Use: No    Family history is  + HD , PVD  Review of Systems  DATA OBTAINED: from patient and nurse GENERAL:  no fevers, fatigue, appetite changes SKIN: No itching, rash or wounds does have some erythema on her right lower leg less on her left lower leg EYES: No eye pain, redness, discharge EARS: No earache, tinnitus, change in hearing NOSE: No congestion, drainage or bleeding  MOUTH/THROAT: No mouth or tooth pain, No sore throat RESPIRATORY: Does not complain of increased shortness of breath cough apparently is improving CARDIAC: No chest pain, palpitations, lower extremity edema  GI: No abdominal pain, No N/V/D or constipation, No heartburn or reflux  GU: No dysuria, frequency or urgency, or incontinence  MUSCULOSKELETAL: No unrelieved bone/joint pain NEUROLOGIC: No headache, dizziness or focal weakness PSYCHIATRIC: No c/o anxiety or sadness   There were no vitals filed for this visit.  SpO2 Readings from Last 1 Encounters:  09/20/15 96%    Temperature 97.2 pulse 82 respirations 18 blood pressure 146/70    Physical Exam  GENERAL APPEARANCE: Alert, conversant,  No acute distress sitting comfortably in her chair SKIN: No diaphoresis rash she does have some increased erythema anterior lower legs right greater than left-this is somewhat warm to touch not acutely tender she has baseline increased right  lower extremity edema again previous Dopplers have been negative HEAD: Normocephalic, atraumatic  EYES: Conjunctiva/lids clear. Pupils round, reactive. EOMs intact.  EARS: External exam WNL, canals clear. Hearing grossly normal.  NOSE: No deformity or discharge.  MOUTH/THROAT: Oropharynx clear mucous membranes  moist RESPIRATORY: Has some bronchial sounds diffuse there is no labored breathing the sounds improved fromprevious exam   CARDIOVASCULAR: Heart Regular,irregular   no murmurs, rubs or gallops.  peripheral edema. Right greater than left --approximately 2+ edema on the right  GASTROINTESTINAL: Abdomen is soft, non-tender, not distended w/ normal bowel sounds. GENITOURINARY: Bladder non tender, not distended  MUSCULOSKELETAL: No abnormal joints or musculature  frail NEUROLOGIC:  Cranial nerves 2-12 grossly intact. Moves all extremities  PSYCHIATRIC: Mood and affect appropriate to situation, no behavioral issues continues to be very pleasant interactive  Patient Active Problem List   Diagnosis Date Noted  . Cellulitis of leg, right 09/30/2015  . Cough 09/29/2015  . Abnormal chest x-ray 09/28/2015  . Edema of right lower extremity 09/28/2015  . Chronic atrial fibrillation ([facility]) 09/28/2015  . Altered mental status   . Elevated brain natriuretic peptide (BNP) level 09/17/2015  . Acute renal failure superimposed on stage 3 chronic kidney disease ([place]) 09/17/2015  . Diarrhea 09/17/2015  . Acute encephalopathy   . Hyperkalemia 09/16/2015  . Seizure ([facility]) 09/16/2015  . Seizures ([facility]) 09/16/2015  . Nausea with vomiting 09/16/2015  . Syncope 09/16/2015  . Renal insufficiency 09/12/2015  . Epistaxis 09/07/2015  . Supratherapeutic INR 08/30/2015  . CAP (community acquired pneumonia) 08/30/2015  . Asthma with exacerbation 08/14/2015  . PCP NOTES >>>>> 04/24/2015  . Hyperlipidemia 12/31/2014  . TIA (transient ischemic attack) 12/11/2014  . Dyslipidemia 12/11/2014  . History of CVA (cerebrovascular accident) 12/01/2014  . BP (high blood pressure) 12/01/2014  . Infection of urinary tract 12/01/2014  . Change in blood platelet count 12/01/2014  . Chronic renal insufficiency, stage II (mild) 11/12/2013  . Impingement syndrome, shoulder, left 11/09/2013  . Acquired short bowel syndrome 11/09/2013   . Encounter for therapeutic drug monitoring 08/30/2013  . Hx pulmonary embolism 06/27/2013  . Warfarin anticoagulation 06/27/2013  . DDD (degenerative disc disease), lumbar 11/18/2012  . Mitral regurgitation 08/26/2011  . DYSPHAGIA UNSPECIFIED 07/31/2010  . ATRIAL FIBRILLATION WITH SLOW VENTRICULAR RESPONSE 07/08/2010  . COLON CANCER, HX OF 07/08/2010  . DYSPNEA/SHORTNESS OF BREATH 06/11/2010  . DIZZINESS 10/18/2009  . HYPOTENSION, ORTHOSTATIC 09/20/2009  . MUSCLE PAIN 08/23/2008  . Essential hypertension 12/16/2007  . EDEMA- LOCALIZED 12/16/2007  . HIP PAIN, CHRONIC 12/28/2006  . ANEMIA-NOS 07/15/2006  . Personal history of venous thrombosis and embolism 07/15/2006     Labs.  09/30/2015.  INR-1.8  09/25/2015.  Sodium 140 potassium 4.2 BUN 6 creatinine 0.82 CO2 level XXIII CBC    Component Value Date/Time   WBC 4.7 09/19/2015 0440   WBC 5.0 06/09/2012 1509   RBC 3.65* 09/19/2015 0440   RBC 4.71 06/09/2012 1509   HGB 9.7* 09/19/2015 0440   HGB 15.2 06/09/2012 1509   HCT 32.8* 09/19/2015 0440   HCT 44.7 06/09/2012 1509   PLT 159 09/19/2015 0440   PLT 119* 06/09/2012 1509   MCV 89.9 09/19/2015 0440   MCV 94.9 06/09/2012 1509   LYMPHSABS 1.0 09/17/2015 0422   LYMPHSABS 1.5 06/09/2012 1509   MONOABS 0.5 09/17/2015 0422   MONOABS 0.5 06/09/2012 1509   EOSABS 0.0 09/17/2015 0422   EOSABS 0.1 06/09/2012 1509   BASOSABS 0.0 09/17/2015 0422   BASOSABS 0.0 06/09/2012 1509  CMP     Component Value Date/Time   NA 142 09/19/2015 0440   NA 142 06/09/2012 1509   K 4.8 09/19/2015 0440   K 3.9 06/09/2012 1509   CL 114* 09/19/2015 0440   CL 108* 06/09/2012 1509   CO2 20* 09/19/2015 0440   CO2 26 06/09/2012 1509   GLUCOSE 71 09/19/2015 0440   GLUCOSE 103* 06/09/2012 1509   BUN 17 09/19/2015 0440   BUN 17.0 06/09/2012 1509   CREATININE 1.56* 09/19/2015 0440   CREATININE 1.14* 11/18/2012 1700   CREATININE 1.2* 06/09/2012 1509   CALCIUM 8.0* 09/19/2015 0440    CALCIUM 9.5 06/09/2012 1509   PROT 6.7 09/16/2015 1537   PROT 7.2 06/09/2012 1509   ALBUMIN 3.0* 09/16/2015 1537   ALBUMIN 4.2 06/09/2012 1509   AST 29 09/16/2015 1537   AST 17 06/09/2012 1509   ALT 12* 09/16/2015 1537   ALT 11 06/09/2012 1509   ALKPHOS 97 09/16/2015 1537   ALKPHOS 47 06/09/2012 1509   BILITOT 0.8 09/16/2015 1537   BILITOT 0.73 06/09/2012 1509   GFRNONAA 29* 09/19/2015 0440   GFRAA 34* 09/19/2015 0440    Lab Results  Component Value Date   HGBA1C 5.6 11/13/2013     Dg Chest 2 View  09/16/2015  CLINICAL DATA:  Seizures.  Clinical concern for pneumonia. EXAM: CHEST  2 VIEW COMPARISON:  08/30/2015 chest radiograph. FINDINGS: Stable cardiomediastinal silhouette with mild cardiomegaly. No pneumothorax. No pleural effusion. Patchy consolidation in the lower lobes is not definitely changed from 08/30/2015. No pulmonary edema. No additional sites of consolidation. IMPRESSION: 1. Patchy bilateral lower lobe consolidation, not definitely changed from 08/30/2015, which could represent persistence of a prior pneumonia and/or superimposed new pneumonia. Recommend follow-up PA and lateral post treatment chest radiographs in 6-8 weeks. If the lower lobe opacities persist on follow-up chest radiographs, a chest CT would then be recommended for further evaluation. 2. Stable cardiomegaly without pulmonary edema. Electronically Signed   By: Ilona Sorrel M.D.   On: 09/16/2015 16:29   Ct Head Wo Contrast  09/16/2015  CLINICAL DATA:  Seizures EXAM: CT HEAD WITHOUT CONTRAST TECHNIQUE: Contiguous axial images were obtained from the base of the skull through the vertex without intravenous contrast. COMPARISON:  August 30, 2015 FINDINGS: Mild diffuse atrophy is stable. There is a stable left-sided posterior fossa extra-axial arachnoid cyst measuring 3.3 x 1.8 cm, stable. There is no evidence of intra-axial mass. There is no hemorrhage, subdural or epidural fluid collection, or midline shift. There  is patchy small vessel disease throughout the centra semiovale bilaterally. There is a prior small lacunar infarct in the posterior limb of the right external capsule. No new gray-white compartment lesions are identified. No acute infarct is evident. The bony calvarium appears intact. The mastoid air cells are clear. There are no intraorbital lesions apparent. There is a small retention cyst in the posterior left maxillary antrum. There is mild mucosal thickening in the inferior right maxillary antrum. There is polyposis in the anterior right naris, stable. There is leftward deviation of the nasal septum. IMPRESSION: Atrophy with supratentorial small vessel disease, stable. Prior small lacunar infarct in the right external capsule, stable. No acute infarct evident. No hemorrhage. No intra-axial mass. Stable posterior fossa extra-axial cyst on the left. Areas of paranasal sinus disease as well as anterior right nasal polyposis. Electronically Signed   By: Lowella Grip III M.D.   On: 09/16/2015 16:22   Ct Chest Wo Contrast  09/17/2015  CLINICAL DATA:  Pneumonia, shortness of breath on exertion for several months EXAM: CT CHEST WITHOUT CONTRAST TECHNIQUE: Multidetector CT imaging of the chest was performed following the standard protocol without IV contrast. COMPARISON:  09/16/2015 FINDINGS: Mediastinum/Nodes: Moderate cardiomegaly. No significant pericardial effusion. Extensive 3 vessel coronary artery calcification. No significant hilar or mediastinal adenopathy appreciated on this noncontrast study. Thoracic aorta shows calcification but no dilatation. There are secretions or reflux material within the esophagus. Lungs/Pleura: Not appreciated on comparison study, there is a small left and small to moderate right pleural effusion. There is dependent atelectasis in the right upper lobe. Mild reticular nodular opacity lateral inferior right middle lobe. The right lower lobe demonstrates consolidation in the  dependent portion most consistent with compressive atelectasis. On the left, minimal inferolateral lingular atelectasis is noted. There is dependent consolidation in the area of pleural effusion. Upper abdomen: No acute abnormalities Musculoskeletal: No acute findings IMPRESSION: Bilateral pleural effusions with underlying consolidation, with additional dependent consolidation in the posterior aspect of the right upper lobe. Airspace opacity is likely largely due to atelectasis although possibility of pneumonia on either side is not excluded. Electronically Signed   By: Skipper Cliche M.D.   On: 09/17/2015 13:29   Mr Brain Wo Contrast  09/17/2015  CLINICAL DATA:  Two episodes of seizure like activity with unresponsiveness. History of atrial fibrillation, colon cancer status post resection, an stroke. EXAM: MRI HEAD WITHOUT CONTRAST TECHNIQUE: Multiplanar, multiecho pulse sequences of the brain and surrounding structures were obtained without intravenous contrast. COMPARISON:  Head CT 09/16/2015 FINDINGS: The patient was unable to tolerate the complete examination. Axial diffusion, FLAIR, and T2 and sagittal T1 weighted sequences were obtained. T1 images are moderately motion degraded, with mild motion artifact on the other sequences. There is no restricted diffusion to indicate acute infarct. No acute intracranial hemorrhage, mass, midline shift, or extra-axial fluid collection is identified. There is mild generalized cerebral atrophy. 3.3 x 2.2 cm arachnoid cyst is noted in the posterior fossa with mild flattening of the posteromedial left cerebellar hemisphere. Patchy T2 hyperintensities throughout the subcortical and deep cerebral white matter bilaterally are nonspecific but compatible with moderately advanced chronic small vessel ischemia. A chronic lacunar infarct is noted in the right lentiform nucleus. There is a slightly dilated perivascular space versus punctate chronic infarct in the left lentiform  nucleus. Mild chronic small vessel ischemic changes are noted in the brainstem. Prior bilateral cataract extraction is noted. No significant inflammatory changes seen in the paranasal sinuses are mastoid air cells. Major intracranial vascular flow voids are grossly preserved. IMPRESSION: 1. Motion degraded incomplete examination.  No acute infarct. 2. Moderately advanced chronic small vessel ischemic disease. Electronically Signed   By: Logan Bores M.D.   On: 09/17/2015 07:57   US Renal  09/16/2015  CLINICAL DATA:  Acute kidney injury EXAM: RENAL / URINARY TRACT ULTRASOUND COMPLETE COMPARISON:  08/08/2014 FINDINGS: Right Kidney: Length: 9.6 cm. Cortical thinning with increased echogenicity. No hydronephrosis. No evidence of mass Left Kidney: Length: 9 cm. Poorly visualized. Previously seen cystic mass is not resolved. No hydronephrosis. Bladder: Appears normal for degree of bladder distention. IMPRESSION: 1. No hydronephrosis. 2. Bilateral renal atrophy. Electronically Signed   By: Monte Fantasia M.D.   On: 09/16/2015 22:46    Not all labs, radiology exams or other studies done during hospitalization come through on my EPIC note; however they are reviewed by me.    Assessment and Plan   #1 suspected cellulitis lower extremities right greater than left-will treat with  doxycycline 100 mg twice a day for 7 days and monitor-she is afebrile appears to be stable in that regard.  #2 history of atrial fibrillation and DVT on chronic Coumadin-INR slightly subtherapeutic at 1.8 will increase to 3 mg a day and recheck this on Thursday, March 30-will have to be an especially close eye on thiI's and she is now on an antibiotic.  #3-history of acute renal failure-this prompted hospitalization-creatinine 0.82 BUN of 6 on lab done on March 22 shows stability we will update BMP has been ordered we will order an update CBC as well for follow-up  [id number].

## 2015-10-02 ENCOUNTER — Encounter: Payer: Self-pay | Admitting: Internal Medicine

## 2015-10-08 ENCOUNTER — Ambulatory Visit: Payer: Self-pay | Admitting: General Practice

## 2015-10-08 DIAGNOSIS — Z5181 Encounter for therapeutic drug level monitoring: Secondary | ICD-10-CM

## 2015-10-08 DIAGNOSIS — I4891 Unspecified atrial fibrillation: Secondary | ICD-10-CM

## 2015-10-14 ENCOUNTER — Encounter: Payer: Self-pay | Admitting: Internal Medicine

## 2015-10-14 ENCOUNTER — Non-Acute Institutional Stay (SKILLED_NURSING_FACILITY): Payer: Medicare Other | Admitting: Internal Medicine

## 2015-10-14 DIAGNOSIS — N183 Chronic kidney disease, stage 3 (moderate): Secondary | ICD-10-CM | POA: Diagnosis not present

## 2015-10-14 DIAGNOSIS — K912 Postsurgical malabsorption, not elsewhere classified: Secondary | ICD-10-CM

## 2015-10-14 DIAGNOSIS — I482 Chronic atrial fibrillation, unspecified: Secondary | ICD-10-CM

## 2015-10-14 DIAGNOSIS — Z7901 Long term (current) use of anticoagulants: Secondary | ICD-10-CM | POA: Diagnosis not present

## 2015-10-14 DIAGNOSIS — N179 Acute kidney failure, unspecified: Secondary | ICD-10-CM | POA: Diagnosis not present

## 2015-10-14 DIAGNOSIS — I10 Essential (primary) hypertension: Secondary | ICD-10-CM

## 2015-10-14 NOTE — Progress Notes (Signed)
Patient ID: [name], female   DOB: 05/15/1927, 82 y.o.   MRN: [id number]    MRN: [id number] Name: [name]  Sex: female Age: 82 y.o. DOB: 02-25-1927  [place] #: [name]  Facility/Room: 110 P Level Of Care: SNF Provider: [name] Emergency Contacts: Extended Emergency Contact Information Primary Emergency Contact: [name],[name] Address: [address]          [place], [place] [address] [name] of [place] Phone: [phone] Mobile Phone: [phone] Relation: Daughter  Code Status:   Allergies: Review of patient's allergies indicates no known allergies.  Chief Complaint  Patient presents with  . Discharge Note    HPI: Patient is 82 y.o. female with HTN, HLD, atrial fibrillation on anticoagulation, colon cancer s/p resection with short gut syndrome, CVA, DVT/PE; Recently hospitalized for acute kidney failure-she has responded very well in hospital treatment and has returned the facility. He has done quite well.  Marland Kitchen  She was recently treated for some cellulitis of her right leg this appears to have improved-also as: Completed treatment for some respiratory cough and congestion that appears to be better as well.  She will be going home with her daughter who is extremely supportive-which currently she has no acute complaints very frail but appears to be doing quite well considering her numerous comorbidities.  She will need PT and OT for further strengthening with some debility as well as home health nursing to follow up her multiple medical issues     He does have a previous history of DVT although she is had several Dopplers recently which have been negative for any acute DVT-she is on Coumadin for anticoagulation INR today is slightly subtherapeutic at 1.6 does have a history of supratherapeutic levels as well-in fact was recently held-we will increase her Coumadin from 2.5 up to 3 mg a day this will have to be rechecked by home health on  Thursday, April 13.     g.  Past Medical History  Diagnosis Date  . Anemia   . DVT (deep venous thrombosis) ([place]) 11/06  . Pulmonary embolism ([place]) 11/06  . Colon cancer ([place]) 12/11  . Hypertension   . Atrial fibrillation ([place])   . Frequent headaches   . Heart murmur   . Stroke ([place]) 11/30/2014  . Colon cancer ([place]) 2011  . Acute renal failure superimposed on stage 3 chronic kidney disease ([place])   . Seizures ([place])   . Hyperkalemia   . Elevated brain natriuretic peptide (BNP) level   . Diarrhea   . Acute encephalopathy     Past Surgical History  Procedure Laterality Date  . Appendectomy  1952  . Total hip arthroplasty  01/03/07    Left hip replacement.  . Colectomy  06/2010    partial, Dr. Donne Hazel complicated by LLE CVT; S/P IVC umbrella & anemia  . Total abdominal hysterectomy w/ bilateral salpingoophorectomy  1973    For Fibroids  . Vena cava filter placement  06/28/2010  . Hip fracture surgery  2006    Trauma      Medication List       This list is accurate as of: 10/14/15 11:59 PM.  Always use your most recent med list.               acetaminophen 500 MG tablet  Commonly known as:  TYLENOL  Take 500 mg by mouth every 8 (eight) hours as needed for mild pain.     albuterol 108 (90 Base)  MCG/ACT inhaler  Commonly known as:  VENTOLIN HFA  Inhale 2 puffs into the lungs every 6 (six) hours as needed for wheezing or shortness of breath.     ALPRAZolam 0.25 MG tablet  Commonly known as:  XANAX  Take 1 tablet (0.25 mg total) by mouth 2 (two) times daily as needed for anxiety.     aspirin 81 MG tablet  Take 81 mg by mouth daily.     atorvastatin 20 MG tablet  Commonly known as:  LIPITOR  Take 1 tablet (20 mg total) by mouth daily.     bisacodyl 10 MG suppository  Commonly known as:  DULCOLAX  Place 10 mg rectally as needed for moderate constipation. Reported on 10/17/2015     CALCIUM 1200 PO  Take 2 tablets by mouth daily.     fentaNYL 50 MCG/HR   Commonly known as:  DURAGESIC - dosed mcg/hr  Place 1 patch (50 mcg total) onto the skin every 3 (three) days.     Fluticasone-Salmeterol 100-50 MCG/DOSE Aepb  Commonly known as:  ADVAIR DISKUS  Inhale 1 puff into the lungs 2 (two) times daily.     furosemide 40 MG tablet  Commonly known as:  LASIX  Take 1 tablet (40 mg total) by mouth every Monday, Wednesday, and Friday.     loperamide 2 MG tablet  Commonly known as:  IMODIUM A-D  Give 2 mg by mouth with first loose stool. Give 2 mg by mouth for additional loose stool.     loratadine 10 MG tablet  Commonly known as:  CLARITIN  Take 10 mg by mouth daily.     magnesium hydroxide 400 MG/5ML suspension  Commonly known as:  MILK OF MAGNESIA  Take 30 mLs by mouth daily as needed for moderate constipation. Reported on 10/17/2015     metoprolol tartrate 25 MG tablet  Commonly known as:  LOPRESSOR  Take 1 tablet (25 mg total) by mouth 2 (two) times daily.     MULTIPLE VITAMINS/WOMENS PO  Take by mouth daily.     promethazine 25 MG/ML injection  Commonly known as:  PHENERGAN  Reported on 10/17/2015     traMADol 50 MG tablet  Commonly known as:  ULTRAM  Take 1 tablet (50 mg total) by mouth every 6 (six) hours as needed. for pain     verapamil 120 MG CR tablet  Commonly known as:  CALAN-SR  Take 120 mg by mouth 2 (two) times daily. Reported on 10/17/2015     warfarin 2 MG tablet  Commonly known as:  COUMADIN  Take as directed by [facility].       Current Outpatient Prescriptions on File Prior to Visit  Medication Sig Dispense Refill  . acetaminophen (TYLENOL) 500 MG tablet Take 500 mg by mouth every 8 (eight) hours as needed for mild pain.     Marland Kitchen albuterol (VENTOLIN HFA) 108 (90 Base) MCG/ACT inhaler Inhale 2 puffs into the lungs every 6 (six) hours as needed for wheezing or shortness of breath. 1 Inhaler 1  . ALPRAZolam (XANAX) 0.25 MG tablet Take 1 tablet (0.25 mg total) by mouth 2 (two) times daily as needed for  anxiety. 60 tablet 3  . aspirin 81 MG tablet Take 81 mg by mouth daily.    Marland Kitchen atorvastatin (LIPITOR) 20 MG tablet Take 1 tablet (20 mg total) by mouth daily. 90 tablet 0  . Calcium Carbonate-Vit D-Min (CALCIUM 1200 PO) Take 2 tablets by mouth daily.     Marland Kitchen  Fluticasone-Salmeterol (ADVAIR DISKUS) 100-50 MCG/DOSE AEPB Inhale 1 puff into the lungs 2 (two) times daily. 60 each 5  . furosemide (LASIX) 40 MG tablet Take 1 tablet (40 mg total) by mouth every Monday, Wednesday, and Friday. (Patient taking differently: Take 40 mg by mouth daily. ) 30 tablet 0  . loperamide (IMODIUM A-D) 2 MG tablet Give 2 mg by mouth with first loose stool. Give 2 mg by mouth for additional loose stool.    Marland Kitchen loratadine (CLARITIN) 10 MG tablet Take 10 mg by mouth daily.    . metoprolol tartrate (LOPRESSOR) 25 MG tablet Take 1 tablet (25 mg total) by mouth 2 (two) times daily. 180 tablet 1  . Multiple Vitamins-Minerals (MULTIPLE VITAMINS/WOMENS PO) Take by mouth daily.    . promethazine (PHENERGAN) 25 MG/ML injection Reported on 10/17/2015    . traMADol (ULTRAM) 50 MG tablet Take 1 tablet (50 mg total) by mouth every 6 (six) hours as needed. for pain 30 tablet 3   No current facility-administered medications on file prior to visit.   +  No orders of the defined types were placed in this encounter.    Immunization History  Administered Date(s) Administered  . Influenza Split 03/30/2012  . Influenza Whole 04/30/2006, 04/20/2008, 04/15/2009  . Influenza, High Dose Seasonal PF 04/20/2013, 03/27/2015  . Influenza,inj,Quad PF,36+ Mos 05/23/2014  . Pneumococcal Conjugate-13 04/24/2015  . Pneumococcal Polysaccharide-23 07/06/2009    Social History  Substance Use Topics  . Smoking status: Never Smoker   . Smokeless tobacco: Not on file  . Alcohol Use: No    Family history is  + HD , PVD  Review of Systems  DATA OBTAINED: from patient and nurse GENERAL:  no fevers, fatigue, appetite changes SKIN: No itching, rash  or woundsDoes have history at times of lower extremity cellulitis EYES: No eye pain, redness, discharge EARS: No earache, tinnitus, change in hearing NOSE: No congestion, drainage or bleeding  MOUTH/THROAT: No mouth or tooth pain, No sore throat RESPIRATORY: Does not complain of increased shortness of breath or cough today CARDIAC: No chest pain, palpitations, lower extremity edema  GI: No abdominal pain, No N/V/D or constipation, No heartburn or reflux  GU: No dysuria, frequency or urgency, or incontinence  MUSCULOSKELETAL: No unrelieved bone/joint pain is ambulatory but continues to be quite weak NEUROLOGIC: No headache, dizziness or focal weakness PSYCHIATRIC: No c/o anxiety or sadness   Filed Vitals:   10/14/15 1302  BP: 121/74  Pulse: 96  Temp: 97.1 F (36.2 C)  Resp: 20    SpO2 Readings from Last 1 Encounters:  10/17/15 96%       Physical Exam  GENERAL APPEARANCE: Alert, conversant,  No acute distress sitting comfortably in her chair SKIN: No diaphoresis rash Increased erythema right lower leg appears resolved HEAD: Normocephalic, atraumatic  EYES: Conjunctiva/lids clear. Pupils round, reactive. EOMs intact.  EARS: External exam WNL, canals clear. Hearing grossly normal.  NOSE: No deformity or discharge.  MOUTH/THROAT: Oropharynx clear mucous membranes moist RESPIRATORY: Clear to auscultation with reduced air entry which is not new no labored breathing  CARDIOVASCULAR: Heart Regular,irregular   no murmurs, rubs or gallops.  peripheral edema. Right greater than left --approximately 2+ edema on the right  GASTROINTESTINAL: Abdomen is soft, non-tender, not distended w/ normal bowel sounds. GENITOURINARY: Bladder non tender, not distended  MUSCULOSKELETAL: No abnormal joints or musculature  Frail-- NEUROLOGIC:  Cranial nerves 2-12 grossly intact. Moves all extremities  PSYCHIATRIC: Mood and affect appropriate to situation, no behavioral  issues continues to be very  pleasant interactive  Patient Active Problem List   Diagnosis Date Noted  . Cellulitis of leg, right 09/30/2015  . Cough 09/29/2015  . Abnormal chest x-ray 09/28/2015  . Edema of right lower extremity 09/28/2015  . Chronic atrial fibrillation ([place]) 09/28/2015  . Altered mental status   . Elevated brain natriuretic peptide (BNP) level 09/17/2015  . Acute renal failure superimposed on stage 3 chronic kidney disease ([place]) 09/17/2015  . Diarrhea 09/17/2015  . Acute encephalopathy   . Hyperkalemia 09/16/2015  . Seizure ([place]) 09/16/2015  . Seizures ([place]) 09/16/2015  . Nausea with vomiting 09/16/2015  . Syncope 09/16/2015  . Renal insufficiency 09/12/2015  . Epistaxis 09/07/2015  . Supratherapeutic INR 08/30/2015  . CAP (community acquired pneumonia) 08/30/2015  . Asthma with exacerbation 08/14/2015  . PCP NOTES >>>>> 04/24/2015  . Hyperlipidemia 12/31/2014  . TIA (transient ischemic attack) 12/11/2014  . Dyslipidemia 12/11/2014  . History of CVA (cerebrovascular accident) 12/01/2014  . BP (high blood pressure) 12/01/2014  . Infection of urinary tract 12/01/2014  . Change in blood platelet count 12/01/2014  . Chronic renal insufficiency, stage II (mild) 11/12/2013  . Impingement syndrome, shoulder, left 11/09/2013  . Acquired short bowel syndrome 11/09/2013  . Encounter for therapeutic drug monitoring 08/30/2013  . Hx pulmonary embolism 06/27/2013  . Warfarin anticoagulation 06/27/2013  . DDD (degenerative disc disease), lumbar 11/18/2012  . Mitral regurgitation 08/26/2011  . DYSPHAGIA UNSPECIFIED 07/31/2010  . ATRIAL FIBRILLATION WITH SLOW VENTRICULAR RESPONSE 07/08/2010  . COLON CANCER, HX OF 07/08/2010  . DYSPNEA/SHORTNESS OF BREATH 06/11/2010  . DIZZINESS 10/18/2009  . HYPOTENSION, ORTHOSTATIC 09/20/2009  . MUSCLE PAIN 08/23/2008  . Essential hypertension 12/16/2007  . EDEMA- LOCALIZED 12/16/2007  . HIP PAIN, CHRONIC 12/28/2006  . ANEMIA-NOS 07/15/2006  . Personal  history of venous thrombosis and embolism 07/15/2006     Labs  10/14/2015.  INR 1.6.  10/01/2015.  Sodium 145 potassium 3.8 BUN 7 creatinine 0.9 to.  03/17.  WBC 6.2 hemoglobin 10.7 platelets 184  09/23/2011.  Marland Kitchen  09/30/2015.  INR-1.8  09/25/2015.  Sodium 140 potassium 4.2 BUN 6 creatinine 0.82 CO2 level XXIII CBC    Component Value Date/Time   WBC 4.7 10/17/2015 1219   WBC 5.0 06/09/2012 1509   RBC 4.24 10/17/2015 1219   RBC 4.71 06/09/2012 1509   HGB 11.5* 10/17/2015 1219   HGB 15.2 06/09/2012 1509   HCT 35.7* 10/17/2015 1219   HCT 44.7 06/09/2012 1509   PLT 148.0* 10/17/2015 1219   PLT 119* 06/09/2012 1509   MCV 84.2 10/17/2015 1219   MCV 94.9 06/09/2012 1509   LYMPHSABS 1.1 10/17/2015 1219   LYMPHSABS 1.5 06/09/2012 1509   MONOABS 0.4 10/17/2015 1219   MONOABS 0.5 06/09/2012 1509   EOSABS 0.2 10/17/2015 1219   EOSABS 0.1 06/09/2012 1509   BASOSABS 0.0 10/17/2015 1219   BASOSABS 0.0 06/09/2012 1509    CMP     Component Value Date/Time   NA 141 10/17/2015 1219   NA 142 06/09/2012 1509   K 3.8 10/17/2015 1219   K 3.9 06/09/2012 1509   CL 107 10/17/2015 1219   CL 108* 06/09/2012 1509   CO2 27 10/17/2015 1219   CO2 26 06/09/2012 1509   GLUCOSE 92 10/17/2015 1219   GLUCOSE 103* 06/09/2012 1509   BUN 11 10/17/2015 1219   BUN 17.0 06/09/2012 1509   CREATININE 0.88 10/17/2015 1219   CREATININE 1.14* 11/18/2012 1700   CREATININE 1.2* 06/09/2012 1509  CALCIUM 9.3 10/17/2015 1219   CALCIUM 9.5 06/09/2012 1509   PROT 6.7 09/16/2015 1537   PROT 7.2 06/09/2012 1509   ALBUMIN 3.0* 09/16/2015 1537   ALBUMIN 4.2 06/09/2012 1509   AST 29 09/16/2015 1537   AST 17 06/09/2012 1509   ALT 12* 09/16/2015 1537   ALT 11 06/09/2012 1509   ALKPHOS 97 09/16/2015 1537   ALKPHOS 47 06/09/2012 1509   BILITOT 0.8 09/16/2015 1537   BILITOT 0.73 06/09/2012 1509   GFRNONAA 29* 09/19/2015 0440   GFRAA 34* 09/19/2015 0440    Lab Results  Component Value Date    HGBA1C 5.6 11/13/2013     Dg Chest 2 View  09/16/2015  CLINICAL DATA:  Seizures.  Clinical concern for pneumonia. EXAM: CHEST  2 VIEW COMPARISON:  08/30/2015 chest radiograph. FINDINGS: Stable cardiomediastinal silhouette with mild cardiomegaly. No pneumothorax. No pleural effusion. Patchy consolidation in the lower lobes is not definitely changed from 08/30/2015. No pulmonary edema. No additional sites of consolidation. IMPRESSION: 1. Patchy bilateral lower lobe consolidation, not definitely changed from 08/30/2015, which could represent persistence of a prior pneumonia and/or superimposed new pneumonia. Recommend follow-up PA and lateral post treatment chest radiographs in 6-8 weeks. If the lower lobe opacities persist on follow-up chest radiographs, a chest CT would then be recommended for further evaluation. 2. Stable cardiomegaly without pulmonary edema. Electronically Signed   By: Ilona Sorrel M.D.   On: 09/16/2015 16:29   Ct Head Wo Contrast  09/16/2015  CLINICAL DATA:  Seizures EXAM: CT HEAD WITHOUT CONTRAST TECHNIQUE: Contiguous axial images were obtained from the base of the skull through the vertex without intravenous contrast. COMPARISON:  August 30, 2015 FINDINGS: Mild diffuse atrophy is stable. There is a stable left-sided posterior fossa extra-axial arachnoid cyst measuring 3.3 x 1.8 cm, stable. There is no evidence of intra-axial mass. There is no hemorrhage, subdural or epidural fluid collection, or midline shift. There is patchy small vessel disease throughout the centra semiovale bilaterally. There is a prior small lacunar infarct in the posterior limb of the right external capsule. No new gray-white compartment lesions are identified. No acute infarct is evident. The bony calvarium appears intact. The mastoid air cells are clear. There are no intraorbital lesions apparent. There is a small retention cyst in the posterior left maxillary antrum. There is mild mucosal thickening in the  inferior right maxillary antrum. There is polyposis in the anterior right naris, stable. There is leftward deviation of the nasal septum. IMPRESSION: Atrophy with supratentorial small vessel disease, stable. Prior small lacunar infarct in the right external capsule, stable. No acute infarct evident. No hemorrhage. No intra-axial mass. Stable posterior fossa extra-axial cyst on the left. Areas of paranasal sinus disease as well as anterior right nasal polyposis. Electronically Signed   By: Lowella Grip III M.D.   On: 09/16/2015 16:22   Ct Chest Wo Contrast  09/17/2015  CLINICAL DATA:  Pneumonia, shortness of breath on exertion for several months EXAM: CT CHEST WITHOUT CONTRAST TECHNIQUE: Multidetector CT imaging of the chest was performed following the standard protocol without IV contrast. COMPARISON:  09/16/2015 FINDINGS: Mediastinum/Nodes: Moderate cardiomegaly. No significant pericardial effusion. Extensive 3 vessel coronary artery calcification. No significant hilar or mediastinal adenopathy appreciated on this noncontrast study. Thoracic aorta shows calcification but no dilatation. There are secretions or reflux material within the esophagus. Lungs/Pleura: Not appreciated on comparison study, there is a small left and small to moderate right pleural effusion. There is dependent atelectasis in the right upper  lobe. Mild reticular nodular opacity lateral inferior right middle lobe. The right lower lobe demonstrates consolidation in the dependent portion most consistent with compressive atelectasis. On the left, minimal inferolateral lingular atelectasis is noted. There is dependent consolidation in the area of pleural effusion. Upper abdomen: No acute abnormalities Musculoskeletal: No acute findings IMPRESSION: Bilateral pleural effusions with underlying consolidation, with additional dependent consolidation in the posterior aspect of the right upper lobe. Airspace opacity is likely largely due to  atelectasis although possibility of pneumonia on either side is not excluded. Electronically Signed   By: Skipper Cliche M.D.   On: 09/17/2015 13:29   Mr Brain Wo Contrast  09/17/2015  CLINICAL DATA:  Two episodes of seizure like activity with unresponsiveness. History of atrial fibrillation, colon cancer status post resection, an stroke. EXAM: MRI HEAD WITHOUT CONTRAST TECHNIQUE: Multiplanar, multiecho pulse sequences of the brain and surrounding structures were obtained without intravenous contrast. COMPARISON:  Head CT 09/16/2015 FINDINGS: The patient was unable to tolerate the complete examination. Axial diffusion, FLAIR, and T2 and sagittal T1 weighted sequences were obtained. T1 images are moderately motion degraded, with mild motion artifact on the other sequences. There is no restricted diffusion to indicate acute infarct. No acute intracranial hemorrhage, mass, midline shift, or extra-axial fluid collection is identified. There is mild generalized cerebral atrophy. 3.3 x 2.2 cm arachnoid cyst is noted in the posterior fossa with mild flattening of the posteromedial left cerebellar hemisphere. Patchy T2 hyperintensities throughout the subcortical and deep cerebral white matter bilaterally are nonspecific but compatible with moderately advanced chronic small vessel ischemia. A chronic lacunar infarct is noted in the right lentiform nucleus. There is a slightly dilated perivascular space versus punctate chronic infarct in the left lentiform nucleus. Mild chronic small vessel ischemic changes are noted in the brainstem. Prior bilateral cataract extraction is noted. No significant inflammatory changes seen in the paranasal sinuses are mastoid air cells. Major intracranial vascular flow voids are grossly preserved. IMPRESSION: 1. Motion degraded incomplete examination.  No acute infarct. 2. Moderately advanced chronic small vessel ischemic disease. Electronically Signed   By: Logan Bores M.D.   On:  09/17/2015 07:57   US Renal  09/16/2015  CLINICAL DATA:  Acute kidney injury EXAM: RENAL / URINARY TRACT ULTRASOUND COMPLETE COMPARISON:  08/08/2014 FINDINGS: Right Kidney: Length: 9.6 cm. Cortical thinning with increased echogenicity. No hydronephrosis. No evidence of mass Left Kidney: Length: 9 cm. Poorly visualized. Previously seen cystic mass is not resolved. No hydronephrosis. Bladder: Appears normal for degree of bladder distention. IMPRESSION: 1. No hydronephrosis. 2. Bilateral renal atrophy. Electronically Signed   By: Monte Fantasia M.D.   On: 09/16/2015 22:46    Not all labs, radiology exams or other studies done during hospitalization come through on my EPIC note; however they are reviewed by me.    Assessment and Plan   #1 history of acute renal failure superimposed on stage III chronic kidney disease-again this would require hospitalization previously hospitalization did not show any obstructive nephropathy unclear etiology thought possibly from dehydration?.  She had been having diarrhea this has resolved.  Renal function has improved most recent creatinine 0.96 on March 28 will have home health recheck this later this week.  Hyperkalemia thought secondary to acute renal failure most recent potassium 3.8 again this will need rechecking by home health primary care provider notified of results.  #3 history of atrial fibrillation this appears rate controlled on Lopressor and verapamil-she is on Coumadin and INR slightly subtherapeutic will increase this  up to 3 mg a day and have this rechecked by home health on Thursday, April 13 primary care provider notified of results.  #4 history of diarrhea again she will have this intermittently she does have short bowel syndrome this does not appear to be greatly increased today however this has stabilized it appears.  Hypertension.  This appears well controlled recent blood pressures 121/74 143/79-140/77 apparently there's been  hypotension as well as the past so somewhat loose control is desired she is on verapamil Lopressor as well as Lasix 3 times a week.  #6-hyperlipidemia-last HDL was 48 HDL 20 she is on Lipitor 20 mg a day will defer any further changes to primary care provider.  Again patient appears to be stronger but still has significant debility will need PT and OT for strengthening as well as home health nursing to follow up her multiple medical issues including atrial fibrillation and Coumadin management.  [id number] note greater than 30 minutes spent on this discharge summary-greater than 50% of time spent coordinating plan of care for numerous diagnoses-prescriptions have been written  [phone].

## 2015-10-17 ENCOUNTER — Encounter: Payer: Self-pay | Admitting: Internal Medicine

## 2015-10-17 ENCOUNTER — Ambulatory Visit (INDEPENDENT_AMBULATORY_CARE_PROVIDER_SITE_OTHER): Payer: Medicare Other | Admitting: Internal Medicine

## 2015-10-17 VITALS — BP 132/78 | HR 94 | Temp 97.8°F | Ht 63.0 in | Wt 162.4 lb

## 2015-10-17 DIAGNOSIS — R569 Unspecified convulsions: Secondary | ICD-10-CM

## 2015-10-17 DIAGNOSIS — M5136 Other intervertebral disc degeneration, lumbar region: Secondary | ICD-10-CM

## 2015-10-17 DIAGNOSIS — N289 Disorder of kidney and ureter, unspecified: Secondary | ICD-10-CM

## 2015-10-17 DIAGNOSIS — Z7901 Long term (current) use of anticoagulants: Secondary | ICD-10-CM

## 2015-10-17 DIAGNOSIS — I4891 Unspecified atrial fibrillation: Secondary | ICD-10-CM | POA: Diagnosis not present

## 2015-10-17 DIAGNOSIS — I10 Essential (primary) hypertension: Secondary | ICD-10-CM | POA: Diagnosis not present

## 2015-10-17 DIAGNOSIS — I482 Chronic atrial fibrillation, unspecified: Secondary | ICD-10-CM

## 2015-10-17 DIAGNOSIS — Z09 Encounter for follow-up examination after completed treatment for conditions other than malignant neoplasm: Secondary | ICD-10-CM

## 2015-10-17 DIAGNOSIS — D649 Anemia, unspecified: Secondary | ICD-10-CM | POA: Diagnosis not present

## 2015-10-17 LAB — CBC WITH DIFFERENTIAL/PLATELET
Basophils Absolute: 0 10*3/uL (ref 0.0–0.1)
Basophils Relative: 0.3 % (ref 0.0–3.0)
EOS PCT: 3.7 % (ref 0.0–5.0)
Eosinophils Absolute: 0.2 10*3/uL (ref 0.0–0.7)
HCT: 35.7 % — ABNORMAL LOW (ref 36.0–46.0)
HEMOGLOBIN: 11.5 g/dL — AB (ref 12.0–15.0)
Lymphocytes Relative: 22.9 % (ref 12.0–46.0)
Lymphs Abs: 1.1 10*3/uL (ref 0.7–4.0)
MCHC: 32.2 g/dL (ref 30.0–36.0)
MCV: 84.2 fl (ref 78.0–100.0)
MONOS PCT: 9.2 % (ref 3.0–12.0)
Monocytes Absolute: 0.4 10*3/uL (ref 0.1–1.0)
Neutro Abs: 3 10*3/uL (ref 1.4–7.7)
Neutrophils Relative %: 63.9 % (ref 43.0–77.0)
Platelets: 148 10*3/uL — ABNORMAL LOW (ref 150.0–400.0)
RBC: 4.24 Mil/uL (ref 3.87–5.11)
RDW: 18.7 % — ABNORMAL HIGH (ref 11.5–15.5)
WBC: 4.7 10*3/uL (ref 4.0–10.5)

## 2015-10-17 LAB — BASIC METABOLIC PANEL
BUN: 11 mg/dL (ref 6–23)
CO2: 27 mEq/L (ref 19–32)
Calcium: 9.3 mg/dL (ref 8.4–10.5)
Chloride: 107 mEq/L (ref 96–112)
Creatinine, Ser: 0.88 mg/dL (ref 0.40–1.20)
GFR: 64.62 mL/min (ref >60.00)
Glucose, Bld: 92 mg/dL (ref 70–99)
POTASSIUM: 3.8 meq/L (ref 3.5–5.1)
SODIUM: 141 meq/L (ref 135–145)

## 2015-10-17 LAB — POCT INR: INR: 1.7

## 2015-10-17 MED ORDER — FENTANYL 50 MCG/HR TD PT72: 50.0000 ug | MEDICATED_PATCH | TRANSDERMAL | Status: DC

## 2015-10-17 MED ORDER — IPRATROPIUM BROMIDE 0.02 % IN SOLN: 0.5000 mg | Freq: Four times a day (QID) | RESPIRATORY_TRACT | Status: DC | PRN

## 2015-10-17 NOTE — Patient Instructions (Signed)
GO TO THE LAB :      Get the blood work     GO TO THE FRONT DESK Schedule a Coumadin check in 10 days Schedule your next appointment for a checkup in 4 weeks, nonfasting.      Go  back on aspirin once daily Go back on Calan SR 120: Take only one tablet daily, call if you blood pressure is less than 110/60. Continue the same Coumadin 3 mg, next check in  10 days Use the nebulizer as needed Continue using the fentanyl patch, take tramadol only as needed

## 2015-10-17 NOTE — Progress Notes (Signed)
Pre visit review using our clinic review tool, if applicable. No additional management support is needed unless otherwise documented below in the visit note. 

## 2015-10-17 NOTE — Progress Notes (Signed)
Subjective:    Patient ID: [name], female    DOB: October 12, 1926, 82 y.o.   MRN: [place]:[id number]  DOS:  10/17/2015 Type of visit - description : Hospital follow-up Interval history: Admitted 08-2015, epistaxis , INR elevated D/c to [place] Admitted to the hospital 3-13 -2017 from [facility], discharged 3 days later. Admitted with 2 episodes of seizure-like activity and unresponsiveness. Prior to that she had poor appetite, nausea, generalized weakness and diarrhea. At the hospital was found to have the following problems: Acute renal failure, ultrasound show no obstruction, due to dehydration? Renal was consulted, creatinine decrease, we re- started Lasix at the time of discharge Hyperkalemia: Potassium was trending down Question of seizure: MRI of the brain negative, EEG negative, saw neurology, no need for antiseizure medications. Diarrhea: C. difficile negative, resolved. Chest x-ray show bilateral low consolidation, no change from previous, CT chest show atelectasis and pleural effusion. Right lower extremity edema, [place] negative for DVT Chronic A. fib, on Coumadin  After the acute admission, went to a nursing home, he was seen there by Mr  Oscar La , [place] D/c home 10-15-15  Most recent lab 09/19/2015: Potassium 4.8, creatinine 1.5 , hemoglobin 9.7  Medication reconciliation:   Not taking aspirin Verapamil was discontinued because low blood pressure Coumadin dose it has change it, currently taking 3 mg daily Lasix dose decreased to 1 tablet Monday Wednesday and Friday She is now using nebulizer which seems to be helping a lot with asthma Also taking fentanyl patch for a left shoulder pain, symptoms well-controlled, does not seem sleepy.    Review of Systems Since she left the nursing home 10/15/2015 the daughter reports she is doing great, better than in previous years. More active, more alert. No edema No chest pain or difficulty breathing No nausea, vomiting,  diarrhea.   Past Medical History  Diagnosis Date  . Anemia   . DVT (deep venous thrombosis) ([place]) 11/06  . Pulmonary embolism ([place]) 11/06  . Colon cancer ([place]) 12/11  . Hypertension   . Atrial fibrillation ([place])   . Frequent headaches   . Heart murmur   . Stroke ([place]) 11/30/2014  . Colon cancer ([place]) 2011  . Acute renal failure superimposed on stage 3 chronic kidney disease ([place])   . Seizures ([place])   . Hyperkalemia   . Elevated brain natriuretic peptide (BNP) level   . Diarrhea   . Acute encephalopathy     Past Surgical History  Procedure Laterality Date  . Appendectomy  1952  . Total hip arthroplasty  01/03/07    Left hip replacement.  . Colectomy  06/2010    partial, Dr. Donne Hazel complicated by LLE CVT; S/P IVC umbrella & anemia  . Total abdominal hysterectomy w/ bilateral salpingoophorectomy  1973    For Fibroids  . Vena cava filter placement  06/28/2010  . Hip fracture surgery  2006    Trauma    Social History   Social History  . Marital Status: Widowed    Spouse Name: N/A  . Number of Children: 1  . Years of Education: N/A   Occupational History  . n/d    Social History Main Topics  . Smoking status: Never Smoker   . Smokeless tobacco: Not on file  . Alcohol Use: No  . Drug Use: No  . Sexual Activity: Not on file   Other Topics Concern  . Not on file   Social History Narrative   Lives w/ her daughter  Medication List       This list is accurate as of: 10/17/15 11:59 PM.  Always use your most recent med list.               acetaminophen 500 MG tablet  Commonly known as:  TYLENOL  Take 500 mg by mouth every 8 (eight) hours as needed for mild pain.     albuterol 108 (90 Base) MCG/ACT inhaler  Commonly known as:  VENTOLIN HFA  Inhale 2 puffs into the lungs every 6 (six) hours as needed for wheezing or shortness of breath.     ALPRAZolam 0.25 MG tablet  Commonly known as:  XANAX  Take 1 tablet (0.25 mg total) by mouth 2 (two)  times daily as needed for anxiety.     aspirin 81 MG tablet  Take 81 mg by mouth daily.     atorvastatin 20 MG tablet  Commonly known as:  LIPITOR  Take 1 tablet (20 mg total) by mouth daily.     CALCIUM 1200 PO  Take 2 tablets by mouth daily.     fentaNYL 50 MCG/HR  Commonly known as:  DURAGESIC - dosed mcg/hr  Place 1 patch (50 mcg total) onto the skin every 3 (three) days.     Fluticasone-Salmeterol 100-50 MCG/DOSE Aepb  Commonly known as:  ADVAIR DISKUS  Inhale 1 puff into the lungs 2 (two) times daily.     furosemide 40 MG tablet  Commonly known as:  LASIX  Take 1 tablet (40 mg total) by mouth every Monday, Wednesday, and Friday.     ipratropium 0.02 % nebulizer solution  Commonly known as:  ATROVENT  Take 2.5 mLs (0.5 mg total) by nebulization every 6 (six) hours as needed for wheezing or shortness of breath.     loperamide 2 MG tablet  Commonly known as:  IMODIUM A-D  Give 2 mg by mouth with first loose stool. Give 2 mg by mouth for additional loose stool.     loratadine 10 MG tablet  Commonly known as:  CLARITIN  Take 10 mg by mouth daily.     metoprolol tartrate 25 MG tablet  Commonly known as:  LOPRESSOR  Take 1 tablet (25 mg total) by mouth 2 (two) times daily.     MULTIPLE VITAMINS/WOMENS PO  Take by mouth daily.     promethazine 25 MG/ML injection  Commonly known as:  PHENERGAN  Reported on 10/17/2015     traMADol 50 MG tablet  Commonly known as:  ULTRAM  Take 1 tablet (50 mg total) by mouth every 6 (six) hours as needed. for pain     verapamil 120 MG CR tablet  Commonly known as:  CALAN-SR  Take 120 mg by mouth at bedtime.     warfarin 3 MG tablet  Commonly known as:  COUMADIN  Take 3 mg by mouth daily.           Objective:   Physical Exam BP 132/78 mmHg  Pulse 94  Temp(Src) 97.8 F (36.6 C) (Oral)  Ht 5\' 3"  (1.6 m)  Wt 162 lb 6 oz (73.653 kg)  BMI 28.77 kg/m2  SpO2 96% General:   Well developed, well nourished . NAD.  HEENT:   Normocephalic . Face symmetric, atraumatic Lungs:  CTA B Normal respiratory effort, no intercostal retractions, no accessory muscle use. Heart: Irregular,  no murmur.  Right calf is larger compared to the left but not TTP  Abdomen:  Not distended, soft, non-tender. No rebound  or rigidity.  Skin: Not pale. Not jaundice Neurologic:  alert & oriented X3.  Speech normal Psych--  Cognition and judgment appear intact.  Cooperative with normal attention span and concentration.  Behavior appropriate. No anxious or depressed appearing. Seems in good spirits    Assessment & Plan:   Assessment> HTN Hyperlipidemia insomnia - xanax prn Asthma  Atrial fibrillation--rate controlled, anticoag Stroke 11-2014, no residual deficits (TIA?) Heart murmur: Mitral regurgitation DVT and pulmonary emboli 2006, vena cava filter 2011  MSK: tramadol qd  --DJD --Back pain has seen Dr Nelva Bush, dr Rolena Infante, MRI: djd, stenosis, med treatment Colon cancer 2011 -- Dr Annitta Jersey , d/c from oncology 06-2015   PLAN Recently admitted with a question of seizure, renal failure, right leg swelling with no DVT and an abnormal chest x-ray. Currently doing excellent, according to the daughter better than before. I wonder if she is better because they nebulization that seems to be helping with asthma and the fentanyl patch which seems to be helping with pain. HTN: Continue metoprolol, Lasix Monday Wednesday and Friday, will restart verapamil SR 120 mg one tablet daily because her heart rate is slightly elevated. Consider increase it to twice a day as it was before Insomnia: Continue Xanax Asthma: Continue ventolin, Advair, and ipratropium nebulizer . A. Fibrillation: HR  today is in the 90s, restart verapamil 1 tablet daily, consider go back to 2 tablets a day Stroke: Restart aspirin daily DJD: Previously on tramadol, now on tramadol and the fentanyl patch. She is feeling great, I wonder if it is because she has better pain  control. Continue with the patch, is not getting excessively sleepy and essentially has not needed tramadol. Coumadin management: On 3 mg daily for the last 3 or 4 days, this is a increased dose compared to last week per family. INR today is 1.7. No change, recheck in 10 days. H/o  severe epistaxis  08-2015. RTC 4 weeks

## 2015-10-20 NOTE — Progress Notes (Signed)
Patient ID: [name], female   DOB: 10/21/1926, 82 y.o.   MRN: [place]:[id number] Of note-it appears her verapamil has been discontinued she is only on Lopressor for rate control-again this will need follow-up by her primary care provider currently rate is controlled although at times apparently will go a bit above the 100--will defer to primary care provider-hesitantto make medication changes right at discharge if patient is stable

## 2015-10-20 NOTE — Assessment & Plan Note (Signed)
Recently admitted with a question of seizure, renal failure, right leg swelling with no DVT and an abnormal chest x-ray. Currently doing excellent, according to the daughter better than before. I wonder if she is better because they nebulization that seems to be helping with asthma and the fentanyl patch which seems to be helping with pain. HTN: Continue metoprolol, Lasix Monday Wednesday and Friday, will restart verapamil SR 120 mg one tablet daily because her heart rate is slightly elevated. Consider increase it to twice a day as it was before Insomnia: Continue Xanax Asthma: Continue ventolin, Advair, and ipratropium nebulizer . A. Fibrillation: HR  today is in the 90s, restart verapamil 1 tablet daily, consider go back to 2 tablets a day Stroke: Restart aspirin daily DJD: Previously on tramadol, now on tramadol and the fentanyl patch. She is feeling great, I wonder if it is because she has better pain control. Continue with the patch, is not getting excessively sleepy and essentially has not needed tramadol. Coumadin management: On 3 mg daily for the last 3 or 4 days, this is a increased dose compared to last week per family. INR today is 1.7. No change, recheck in 10 days. H/o  severe epistaxis  08-2015. RTC 4 weeks

## 2015-10-21 ENCOUNTER — Telehealth: Payer: Self-pay | Admitting: Internal Medicine

## 2015-10-21 MED ORDER — PROMETHAZINE HCL 12.5 MG PO TABS: 12.5000 mg | ORAL_TABLET | Freq: Three times a day (TID) | ORAL | Status: DC | PRN

## 2015-10-21 MED ORDER — WARFARIN SODIUM 3 MG PO TABS: ORAL_TABLET | ORAL | Status: DC

## 2015-10-21 NOTE — Telephone Encounter (Signed)
Okay Phenergan 12.5 mg one tablet 3 times a day as needed, #20, no refills

## 2015-10-21 NOTE — Telephone Encounter (Signed)
Pt's daughter is requesting refill on Phenergan. Phenergan injection listed on medication list. Please advise.

## 2015-10-21 NOTE — Telephone Encounter (Signed)
Rx sent 

## 2015-10-21 NOTE — Telephone Encounter (Signed)
Coumadin 3 mg sent  

## 2015-10-29 ENCOUNTER — Ambulatory Visit (INDEPENDENT_AMBULATORY_CARE_PROVIDER_SITE_OTHER): Payer: Medicare Other | Admitting: *Deleted

## 2015-10-29 DIAGNOSIS — I4891 Unspecified atrial fibrillation: Secondary | ICD-10-CM

## 2015-10-29 DIAGNOSIS — Z5181 Encounter for therapeutic drug level monitoring: Secondary | ICD-10-CM | POA: Diagnosis not present

## 2015-10-29 LAB — POCT INR: INR: 5.3

## 2015-10-29 MED ORDER — WARFARIN SODIUM 2.5 MG PO TABS: ORAL_TABLET | ORAL | Status: DC

## 2015-10-29 NOTE — Patient Instructions (Signed)
Per Dr. Larose Kells: Hold coumadin for 2 days. Then start taking coumadin 2.5 mg daily. Recheck INR 2 weeks.

## 2015-10-29 NOTE — Progress Notes (Signed)
Pre visit review using our clinic review tool, if applicable. No additional management support is needed unless otherwise documented below in the visit note.  INR today 5.3. Pt was discharged from hospital 2 weeks ago. She then went to SNF, and has now been discharged home. She has had several changes to her routine and her coumadin regimen over the last month. She reports 1 very minor nose bleed with "a few drops of blood" that resolved on its own. No dietary or medication changes found to account for increased INR.   Per Dr. Larose Kells: Hold coumadin for 2 days. Then start taking coumadin 2.5 mg daily. Recheck INR 2 weeks.  Per Dr. Larose Kells, okay to recheck at next office visit 11/15/15.    Pt and daughter notified of instructions and verbalized understanding.   Dorrene German, RN

## 2015-11-15 ENCOUNTER — Ambulatory Visit: Payer: Medicare Other | Admitting: Internal Medicine

## 2015-11-15 ENCOUNTER — Telehealth: Payer: Self-pay | Admitting: Internal Medicine

## 2015-11-15 NOTE — Telephone Encounter (Signed)
No Charge 

## 2015-11-15 NOTE — Telephone Encounter (Signed)
Pt's daughter [name] called in at 8:18 to cancel pt's appt. She says that she is the pt's transportation and she's not feeling well enough to bring pt in to appt.

## 2015-11-21 ENCOUNTER — Encounter: Payer: Self-pay | Admitting: Internal Medicine

## 2015-11-21 ENCOUNTER — Ambulatory Visit (INDEPENDENT_AMBULATORY_CARE_PROVIDER_SITE_OTHER): Payer: Medicare Other | Admitting: Internal Medicine

## 2015-11-21 VITALS — BP 126/78 | HR 77 | Temp 98.1°F | Ht 63.0 in | Wt 161.1 lb

## 2015-11-21 DIAGNOSIS — I4891 Unspecified atrial fibrillation: Secondary | ICD-10-CM

## 2015-11-21 DIAGNOSIS — I10 Essential (primary) hypertension: Secondary | ICD-10-CM

## 2015-11-21 DIAGNOSIS — Z7901 Long term (current) use of anticoagulants: Secondary | ICD-10-CM | POA: Diagnosis not present

## 2015-11-21 DIAGNOSIS — J4520 Mild intermittent asthma, uncomplicated: Secondary | ICD-10-CM | POA: Diagnosis not present

## 2015-11-21 LAB — POCT INR: INR: 2

## 2015-11-21 NOTE — Patient Instructions (Signed)
   GO TO THE FRONT DESK Schedule a Coumadin check up in 2 weeks from today.  Schedule your next appointment for a checkup in 3 months from today   Continue with Coumadin 2.5 mg daily Call for any refills

## 2015-11-21 NOTE — Progress Notes (Signed)
Pre visit review using our clinic review tool, if applicable. No additional management support is needed unless otherwise documented below in the visit note. 

## 2015-11-21 NOTE — Progress Notes (Signed)
Subjective:    Patient ID: [name], female    DOB: 02-20-1927, 82 y.o.   MRN: [place]:[id number]  DOS:  11/21/2015 Type of visit - description : Checkup, Coumadin management Interval history: The patient continued to feel well, good compliance w/  verapamil, ambulatory BPs when checked are normal according to the patient's daughter. Breathing is  stable, using a nebulizer around once a week. Good compliance with Lasix Monday Wednesday and Friday Last INR was elevated, Coumadin dose decreased.   Review of Systems  No chest pain, difficulty breathing. No nausea, vomiting, diarrhea. Pain continue to be well-controlled Past Medical History  Diagnosis Date  . Anemia   . DVT (deep venous thrombosis) ([place]) 11/06  . Pulmonary embolism ([place]) 11/06  . Colon cancer ([place]) 12/11  . Hypertension   . Atrial fibrillation ([place])   . Frequent headaches   . Heart murmur   . Stroke ([place]) 11/30/2014  . Colon cancer ([place]) 2011  . Acute renal failure superimposed on stage 3 chronic kidney disease ([place])   . Seizures ([place])   . Hyperkalemia   . Elevated brain natriuretic peptide (BNP) level   . Diarrhea   . Acute encephalopathy     Past Surgical History  Procedure Laterality Date  . Appendectomy  1952  . Total hip arthroplasty  01/03/07    Left hip replacement.  . Colectomy  06/2010    partial, Dr. Donne Hazel complicated by LLE CVT; S/P IVC umbrella & anemia  . Total abdominal hysterectomy w/ bilateral salpingoophorectomy  1973    For Fibroids  . Vena cava filter placement  06/28/2010  . Hip fracture surgery  2006    Trauma    Social History   Social History  . Marital Status: Widowed    Spouse Name: N/A  . Number of Children: 1  . Years of Education: N/A   Occupational History  . n/d    Social History Main Topics  . Smoking status: Never Smoker   . Smokeless tobacco: Not on file  . Alcohol Use: No  . Drug Use: No  . Sexual Activity: Not on file   Other Topics Concern  . Not  on file   Social History Narrative   Lives w/ her daughter         Medication List       This list is accurate as of: 11/21/15 11:59 PM.  Always use your most recent med list.               acetaminophen 500 MG tablet  Commonly known as:  TYLENOL  Take 500 mg by mouth every 8 (eight) hours as needed for mild pain.     albuterol 108 (90 Base) MCG/ACT inhaler  Commonly known as:  VENTOLIN HFA  Inhale 2 puffs into the lungs every 6 (six) hours as needed for wheezing or shortness of breath.     ALPRAZolam 0.25 MG tablet  Commonly known as:  XANAX  Take 1 tablet (0.25 mg total) by mouth 2 (two) times daily as needed for anxiety.     aspirin 81 MG tablet  Take 81 mg by mouth daily.     atorvastatin 20 MG tablet  Commonly known as:  LIPITOR  Take 1 tablet (20 mg total) by mouth daily.     CALCIUM 1200 PO  Take 2 tablets by mouth daily.     fentaNYL 50 MCG/HR  Commonly known as:  DURAGESIC - dosed mcg/hr  Place 1 patch (  50 mcg total) onto the skin every 3 (three) days.     Fluticasone-Salmeterol 100-50 MCG/DOSE Aepb  Commonly known as:  ADVAIR DISKUS  Inhale 1 puff into the lungs 2 (two) times daily.     furosemide 40 MG tablet  Commonly known as:  LASIX  Take 1 tablet (40 mg total) by mouth every Monday, Wednesday, and Friday.     ipratropium 0.02 % nebulizer solution  Commonly known as:  ATROVENT  Take 2.5 mLs (0.5 mg total) by nebulization every 6 (six) hours as needed for wheezing or shortness of breath.     loperamide 2 MG tablet  Commonly known as:  IMODIUM A-D  Give 2 mg by mouth with first loose stool. Give 2 mg by mouth for additional loose stool.     loratadine 10 MG tablet  Commonly known as:  CLARITIN  Take 10 mg by mouth daily.     metoprolol tartrate 25 MG tablet  Commonly known as:  LOPRESSOR  Take 1 tablet (25 mg total) by mouth 2 (two) times daily.     MULTIPLE VITAMINS/WOMENS PO  Take by mouth daily.     promethazine 12.5 MG tablet    Commonly known as:  PHENERGAN  Take 1 tablet (12.5 mg total) by mouth 3 (three) times daily as needed for nausea or vomiting.     traMADol 50 MG tablet  Commonly known as:  ULTRAM  Take 1 tablet (50 mg total) by mouth every 6 (six) hours as needed. for pain     verapamil 120 MG CR tablet  Commonly known as:  CALAN-SR  Take 120 mg by mouth at bedtime.     warfarin 3 MG tablet  Commonly known as:  COUMADIN  Take as directed by Coumadin Clinic.     warfarin 2.5 MG tablet  Commonly known as:  COUMADIN  Take as directed by coumadin clinic.           Objective:   Physical Exam BP 126/78 mmHg  Pulse 77  Temp(Src) 98.1 F (36.7 C) (Oral)  Ht 5\' 3"  (1.6 m)  Wt 161 lb 2 oz (73.086 kg)  BMI 28.55 kg/m2  SpO2 96% General:   Well developed, well nourished . NAD.  HEENT:  Normocephalic . Face symmetric, atraumatic Lungs:  CTA B Normal respiratory effort, no intercostal retractions, no accessory muscle use. Heart: Irregular,  no murmur.  Trace pretibial edema.  Abdomen:  Not distended, soft, non-tender. No rebound or rigidity.  Skin: Not pale. Not jaundice Neurologic:  alert & oriented X3.  Speech normal Psych--  Cognition and judgment appear intact.  Cooperative with normal attention span and concentration.  Behavior appropriate. No anxious or depressed appearing.       Assessment & Plan:   Assessment> HTN Hyperlipidemia insomnia - xanax prn Asthma  Atrial fibrillation--rate controlled, anticoag Stroke 11-2014, no residual deficits (TIA?) Heart murmur: Mitral regurgitation DVT and pulmonary emboli 2006, vena cava filter 2011  MSK: tramadol qd  --DJD --Back pain has seen Dr Nelva Bush, dr Rolena Infante, MRI: djd, stenosis, med treatment Colon cancer 2011 -- Dr Annitta Jersey , d/c from oncology 06-2015   PLAN HTN: Well-controlled with metoprolol, verapamil, Lasix Monday Wednesday and Friday. Last BMP satisfactory Asthma: Continue Advair, uses a nebulizer once a week on  average. Atrial fibrillation: Now on beta blockers and verapamil. Heart rate better today Coumadin management : INR was elevated ,Coumadin dose skipped x 2, then  decreased from 3 mg daily to 2.5 mg daily. INR  today 2.0. No change, 2 weeks. DJD: Continue to be well-controlled on fentanyl patch Next visit 3 months Next Coumadin check 2 weeks

## 2015-11-22 NOTE — Assessment & Plan Note (Signed)
HTN: Well-controlled with metoprolol, verapamil, Lasix Monday Wednesday and Friday. Last BMP satisfactory Asthma: Continue Advair, uses a nebulizer once a week on average. Atrial fibrillation: Now on beta blockers and verapamil. Heart rate better today Coumadin management : INR was elevated ,Coumadin dose skipped x 2, then  decreased from 3 mg daily to 2.5 mg daily. INR today 2.0. No change, 2 weeks. DJD: Continue to be well-controlled on fentanyl patch Next visit 3 months Next Coumadin check 2 weeks

## 2015-12-02 ENCOUNTER — Telehealth: Payer: Self-pay | Admitting: Internal Medicine

## 2015-12-03 MED ORDER — FENTANYL 50 MCG/HR TD PT72: 50.0000 ug | MEDICATED_PATCH | TRANSDERMAL | Status: DC

## 2015-12-03 NOTE — Telephone Encounter (Signed)
Pt is requesting refill on Fentanyl patches. Dr. Ethel Rana Pt.  Last OV: 11/21/2015 Last Fill: 10/17/2015 #10 patches and 0RF Pt sig: 1 patch every 3 days PRN   Please advise.

## 2015-12-03 NOTE — Telephone Encounter (Signed)
Rx placed at front desk for pick up. Pt informed via MyChart.

## 2015-12-03 NOTE — Telephone Encounter (Signed)
OK to refill Fentanyl, I will sign

## 2015-12-03 NOTE — Telephone Encounter (Signed)
Rx printed, awaiting MD signature.  

## 2015-12-05 ENCOUNTER — Ambulatory Visit (INDEPENDENT_AMBULATORY_CARE_PROVIDER_SITE_OTHER): Payer: Medicare Other | Admitting: Behavioral Health

## 2015-12-05 DIAGNOSIS — I4891 Unspecified atrial fibrillation: Secondary | ICD-10-CM | POA: Diagnosis not present

## 2015-12-05 DIAGNOSIS — Z5181 Encounter for therapeutic drug level monitoring: Secondary | ICD-10-CM | POA: Diagnosis not present

## 2015-12-05 LAB — POCT INR: INR: 1.8

## 2015-12-05 NOTE — Progress Notes (Signed)
Pre visit review using our clinic review tool, if applicable. No additional management support is needed unless otherwise documented below in the visit note.  Patient presents in office for INR check. Today's reading was 1.8. She did not report any changes in diet, medications or any other positive patient findings. Currently the patient is taking Coumadin 2.5 mg daily.  Per Dr. Larose Kells: Take Coumadin 2.5 mg daily, except on Thursdays take Coumadin 5 mg. Recheck INR 2 weeks. Informed patient of the provider's instructions. She verbalized understanding and did not have any questions or concerns prior to leaving the visit.  Next appointment scheduled for 12/18/15 at 3:00 PM.

## 2015-12-05 NOTE — Patient Instructions (Signed)
Per Dr. Larose Kells: Take Coumadin 2.5 mg daily, except on Thursdays take Coumadin 5 mg. Recheck INR 2 weeks.

## 2015-12-11 ENCOUNTER — Ambulatory Visit: Payer: Medicare Other | Admitting: Internal Medicine

## 2015-12-18 ENCOUNTER — Ambulatory Visit: Payer: Medicare Other

## 2015-12-19 ENCOUNTER — Ambulatory Visit (INDEPENDENT_AMBULATORY_CARE_PROVIDER_SITE_OTHER): Payer: Medicare Other | Admitting: Behavioral Health

## 2015-12-19 DIAGNOSIS — Z5181 Encounter for therapeutic drug level monitoring: Secondary | ICD-10-CM | POA: Diagnosis not present

## 2015-12-19 DIAGNOSIS — I4891 Unspecified atrial fibrillation: Secondary | ICD-10-CM

## 2015-12-19 LAB — POCT INR: INR: 2

## 2015-12-19 NOTE — Progress Notes (Addendum)
Pre visit review using our clinic review tool, if applicable. No additional management support is needed unless otherwise documented below in the visit note.  Patient in office today for INR check. She is accompanied by her daughter. Reading was 2.0. Patient did not report any positive findings. Per Saguier, PA-C: Continue taking Coumadin 2.5 mg daily, except on Thursdays take Coumadin 5 mg. Recheck INR 4 weeks.  Informed both the patient and her daughter of the provider's instructions. They verbalized understanding and did not have any concerns prior to leaving the nurse visit. Next appointment is scheduled for 01/15/16 at 2:30 PM.   After speaking with INR. The above was the advise I gave.  Saguier, Percell Miller, PA-C

## 2015-12-19 NOTE — Patient Instructions (Signed)
Per Saguier, PA-C: Continue taking Coumadin 2.5 mg daily, except on Thursdays take Coumadin 5 mg. Recheck INR 4 weeks.

## 2016-01-07 ENCOUNTER — Other Ambulatory Visit: Payer: Self-pay | Admitting: Internal Medicine

## 2016-01-15 ENCOUNTER — Ambulatory Visit: Payer: Medicare Other

## 2016-01-21 ENCOUNTER — Ambulatory Visit (INDEPENDENT_AMBULATORY_CARE_PROVIDER_SITE_OTHER): Payer: Medicare Other | Admitting: Behavioral Health

## 2016-01-21 DIAGNOSIS — I4891 Unspecified atrial fibrillation: Secondary | ICD-10-CM

## 2016-01-21 DIAGNOSIS — Z5181 Encounter for therapeutic drug level monitoring: Secondary | ICD-10-CM

## 2016-01-21 LAB — POCT INR: INR: 3

## 2016-01-21 NOTE — Progress Notes (Signed)
Pre visit review using our clinic review tool, if applicable. No additional management support is needed unless otherwise documented below in the visit note.  Patient in office for INR check. Today's reading was 3.0. Reviewed medications with the patient. She did not report any positive findings or changes in diet/medications.  Per Dr. Larose Kells: Continue taking Coumadin 2.5 mg daily, except on Thursdays take Coumadin 5 mg. Recheck INR 4 weeks.  Informed patient of the provider's instructions. She verbalized understanding. Patient has requested for the next INR to be checked during her 3 month follow-up visit with PCP on 02/18/16 at 2:00 PM.

## 2016-01-21 NOTE — Patient Instructions (Signed)
Per Dr. Larose Kells: Continue taking Coumadin 2.5 mg daily, except on Thursdays take Coumadin 5 mg. Recheck INR 4 weeks.

## 2016-02-06 ENCOUNTER — Other Ambulatory Visit: Payer: Self-pay | Admitting: Internal Medicine

## 2016-02-18 ENCOUNTER — Ambulatory Visit (INDEPENDENT_AMBULATORY_CARE_PROVIDER_SITE_OTHER): Payer: Medicare Other | Admitting: Internal Medicine

## 2016-02-18 ENCOUNTER — Encounter: Payer: Self-pay | Admitting: Internal Medicine

## 2016-02-18 VITALS — BP 124/76 | HR 62 | Temp 98.2°F | Resp 16 | Ht 63.0 in | Wt 163.4 lb

## 2016-02-18 DIAGNOSIS — M5136 Other intervertebral disc degeneration, lumbar region: Secondary | ICD-10-CM

## 2016-02-18 DIAGNOSIS — I4891 Unspecified atrial fibrillation: Secondary | ICD-10-CM

## 2016-02-18 DIAGNOSIS — J4521 Mild intermittent asthma with (acute) exacerbation: Secondary | ICD-10-CM

## 2016-02-18 DIAGNOSIS — I10 Essential (primary) hypertension: Secondary | ICD-10-CM | POA: Diagnosis not present

## 2016-02-18 LAB — POCT INR: INR: 1.8

## 2016-02-18 MED ORDER — FENTANYL 50 MCG/HR TD PT72: 50.0000 ug | MEDICATED_PATCH | TRANSDERMAL | 0 refills | Status: DC

## 2016-02-18 MED ORDER — LEVALBUTEROL HCL 0.63 MG/3ML IN NEBU: 0.6300 mg | INHALATION_SOLUTION | Freq: Three times a day (TID) | RESPIRATORY_TRACT | 12 refills | Status: DC | PRN

## 2016-02-18 NOTE — Progress Notes (Signed)
Subjective:    Patient ID: [name], female    DOB: 30-Jun-1927, 82 y.o.   MRN: [id number]  DOS:  02/18/2016 Type of visit - description : rov, Here with her daughter Interval history: Asthma: Has developed a URI for the last few days, cough is a slightly more noticeable, no sputum production or hemoptysis. No fever chills. Atrovent nebulization make her cough, change?. Pain management: Request a refill on fentanyl. Uses tramadol very rarely INR check today   Review of Systems   Past Medical History:  Diagnosis Date  . Acute encephalopathy   . Acute renal failure superimposed on stage 3 chronic kidney disease ([place])   . Anemia   . Atrial fibrillation ([place])   . Colon cancer ([place]) 12/11  . Colon cancer ([place]) 2011  . Diarrhea   . DVT (deep venous thrombosis) ([place]) 11/06  . Elevated brain natriuretic peptide (BNP) level   . Frequent headaches   . Heart murmur   . Hyperkalemia   . Hypertension   . Pulmonary embolism ([place]) 11/06  . Seizures ([place])   . Stroke [facility]) 11/30/2014    Past Surgical History:  Procedure Laterality Date  . APPENDECTOMY  1952  . COLECTOMY  06/2010   partial, Dr. Donne Hazel complicated by LLE CVT; S/P IVC umbrella & anemia  . HIP FRACTURE SURGERY  2006   Trauma  . TOTAL ABDOMINAL HYSTERECTOMY W/ BILATERAL SALPINGOOPHORECTOMY  1973   For Fibroids  . TOTAL HIP ARTHROPLASTY  01/03/07   Left hip replacement.  Marland Kitchen VENA CAVA FILTER PLACEMENT  06/28/2010    Social History   Social History  . Marital status: Widowed    Spouse name: N/A  . Number of children: 1  . Years of education: N/A   Occupational History  . n/d Retired   Social History Main Topics  . Smoking status: Never Smoker  . Smokeless tobacco: Never Used  . Alcohol use No  . Drug use: No  . Sexual activity: Not on file   Other Topics Concern  . Not on file   Social History Narrative   Lives w/ her daughter         Medication List       Accurate as of 02/18/16 11:59 PM.  Always use your most recent med list.          acetaminophen 500 MG tablet Commonly known as:  TYLENOL Take 500 mg by mouth every 8 (eight) hours as needed for mild pain.   albuterol 108 (90 Base) MCG/ACT inhaler Commonly known as:  PROAIR HFA Inhale 2 puffs into the lungs every 6 (six) hours as needed for wheezing or shortness of breath.   ALPRAZolam 0.25 MG tablet Commonly known as:  XANAX Take 1 tablet (0.25 mg total) by mouth 2 (two) times daily as needed for anxiety.   aspirin 81 MG tablet Take 81 mg by mouth daily.   atorvastatin 20 MG tablet Commonly known as:  LIPITOR Take 1 tablet (20 mg total) by mouth daily.   CALCIUM 1200 PO Take 2 tablets by mouth daily.   fentaNYL 50 MCG/HR Commonly known as:  DURAGESIC - dosed mcg/hr Place 1 patch (50 mcg total) onto the skin every 3 (three) days.   Fluticasone-Salmeterol 100-50 MCG/DOSE Aepb Commonly known as:  ADVAIR DISKUS Inhale 1 puff into the lungs 2 (two) times daily.   furosemide 40 MG tablet Commonly known as:  LASIX Take 1 tablet (40 mg total) by mouth every Monday, Wednesday,  and Friday.   ipratropium 0.02 % nebulizer solution Commonly known as:  ATROVENT Take 2.5 mLs (0.5 mg total) by nebulization every 6 (six) hours as needed for wheezing or shortness of breath.   levalbuterol 0.63 MG/3ML nebulizer solution Commonly known as:  XOPENEX Take 3 mLs (0.63 mg total) by nebulization every 8 (eight) hours as needed for wheezing or shortness of breath.   loperamide 2 MG tablet Commonly known as:  IMODIUM A-D Give 2 mg by mouth with first loose stool. Give 2 mg by mouth for additional loose stool.   loratadine 10 MG tablet Commonly known as:  CLARITIN Take 10 mg by mouth daily.   metoprolol tartrate 25 MG tablet Commonly known as:  LOPRESSOR Take 1 tablet (25 mg total) by mouth 2 (two) times daily.   MULTIPLE VITAMINS/WOMENS PO Take by mouth daily.   promethazine 12.5 MG tablet Commonly known as:   PHENERGAN Take 1 tablet (12.5 mg total) by mouth 3 (three) times daily as needed for nausea or vomiting.   traMADol 50 MG tablet Commonly known as:  ULTRAM Take 1 tablet (50 mg total) by mouth every 6 (six) hours as needed. for pain   verapamil 120 MG CR tablet Commonly known as:  CALAN-SR Take 120 mg by mouth at bedtime.   warfarin 2.5 MG tablet Commonly known as:  COUMADIN Take as directed by coumadin clinic.          Objective:   Physical Exam BP 124/76 (BP Location: Right Arm, Patient Position: Sitting, Cuff Size: Small)   Pulse 62   Temp 98.2 F (36.8 C) (Oral)   Resp 16   Ht 5\' 3"  (1.6 m)   Wt 163 lb 6 oz (74.1 kg)   SpO2 96%   BMI 28.94 kg/m  General:   Well developed, well nourished . NAD.  HEENT:  Normocephalic . Face symmetric, atraumatic. Nose is slightly congested Lungs:  Prescribed sounds but clear Normal respiratory effort, no intercostal retractions, no accessory muscle use. Heart: Irregular No pretibial edema bilaterally  Skin: Not pale. Not jaundice Neurologic:  alert & oriented X3.  Speech normal, gait not tested Psych  Cooperative with normal attention span and concentration.  Behavior appropriate. No anxious or depressed appearing.      Assessment & Plan:   Assessment (transfer Dr Linna Darner [phone]) HTN Hyperlipidemia insomnia - xanax prn Asthma  Atrial fibrillation--rate controlled, anticoag Stroke 11-2014, no residual deficits (TIA?) Heart murmur: Mitral regurgitation DVT and pulmonary emboli 2006, vena cava filter 2011  MSK: tramadol qd  --DJD --Back pain has seen Dr Nelva Bush, dr Rolena Infante, MRI: djd, stenosis, med treatment Colon cancer 2011 -- Dr Annitta Jersey , d/c from oncology 06-2015   PLAN HTN: On Lopressor,calan, controlled Asthma: Recent URI and increased cough, lung exam very good today, switch Atrovent nebulizer to Xopenex due to cough w/ atrovent. Watch for palpitations w/ Xopenex  High cholesterol: On Lipitor, FLP on  RTC Coumadin management: INR today 1.8, previous INR 3.0, Coumadin has not been change, she is on Coumadin 2.5 mg daily, except on Thursdays take Coumadin 5 mg. Recommend to take a extra 2.5 tablet today, continue with same dosing, recheck in 2 weeks. Controlled substance management: Needs pain medication due to DJD, spinal stenosis, Tramadol helps to some extent but cause drowsiness, fentanyl provides much better relief without any significant drowsiness or difficulties with her respiration. There are no aggressive treatment option for her pain just medical treatment,  Thus will refill fentanyl.  RTC 4-5 months,  fasting

## 2016-02-18 NOTE — Patient Instructions (Signed)
Take a extra 2.5 tablet of Coumadin today otherwise continue with the same dosing  Next Coumadin check in 2 weeks  Next office visit in 4-5 months, fasting  Take fentanyl as prescribed, okay to take occasional tramadol for pain  Need a  flu shot this season

## 2016-02-18 NOTE — Progress Notes (Signed)
Pre visit review using our clinic review tool, if applicable. No additional management support is needed unless otherwise documented below in the visit note. 

## 2016-02-19 NOTE — Assessment & Plan Note (Signed)
HTN: On Lopressor,calan, controlled Asthma: Recent URI and increased cough, lung exam very good today, switch Atrovent nebulizer to Xopenex due to cough w/ atrovent. Watch for palpitations w/ Xopenex  High cholesterol: On Lipitor, FLP on RTC Coumadin management: INR today 1.8, previous INR 3.0, Coumadin has not been change, she is on Coumadin 2.5 mg daily, except on Thursdays take Coumadin 5 mg. Recommend to take a extra 2.5 tablet today, continue with same dosing, recheck in 2 weeks. Controlled substance management: Needs pain medication due to DJD, spinal stenosis, Tramadol helps to some extent but cause drowsiness, fentanyl provides much better relief without any significant drowsiness or difficulties with her respiration. There are no aggressive treatment option for her pain just medical treatment,  Thus will refill fentanyl.  RTC 4-5 months, fasting

## 2016-03-03 ENCOUNTER — Ambulatory Visit (INDEPENDENT_AMBULATORY_CARE_PROVIDER_SITE_OTHER): Payer: Medicare Other | Admitting: *Deleted

## 2016-03-03 DIAGNOSIS — I4891 Unspecified atrial fibrillation: Secondary | ICD-10-CM | POA: Diagnosis not present

## 2016-03-03 DIAGNOSIS — Z86718 Personal history of other venous thrombosis and embolism: Secondary | ICD-10-CM | POA: Diagnosis not present

## 2016-03-03 DIAGNOSIS — Z5181 Encounter for therapeutic drug level monitoring: Secondary | ICD-10-CM

## 2016-03-03 LAB — POCT INR: INR: 2.3

## 2016-03-03 NOTE — Patient Instructions (Signed)
Per Dr. Larose Kells: Continue taking Coumadin 2.5 mg daily, except on Thursdays take Coumadin 5 mg. Recheck INR 4 weeks.

## 2016-03-03 NOTE — Progress Notes (Addendum)
Pre visit review using our clinic review tool, if applicable. No additional management support is needed unless otherwise documented below in the visit note.  INR today 2.3. Pt findings negative.  Per Dr. Larose Kells: Continue taking Coumadin 2.5 mg daily, except on Thursdays take Coumadin 5 mg. Recheck INR 4 weeks.  Dorrene German, RN   Kathlene November, MD

## 2016-03-04 ENCOUNTER — Encounter: Payer: Self-pay | Admitting: Internal Medicine

## 2016-03-10 ENCOUNTER — Encounter: Payer: Self-pay | Admitting: Internal Medicine

## 2016-03-10 MED ORDER — LEVALBUTEROL HCL 0.63 MG/3ML IN NEBU: 0.6300 mg | INHALATION_SOLUTION | Freq: Three times a day (TID) | RESPIRATORY_TRACT | 12 refills | Status: DC | PRN

## 2016-03-10 NOTE — Telephone Encounter (Signed)
Spoke w/ [facility], they no longer supply nebulizer medications. Spoke w/ Maudie Mercury, she informed that [facility] does supply nebulizer medications. MyChart message sent to [name], Pt's daughter, needing clarification if Pt already has nebulizer and supplies or if supplies are also needed. Respiratory order started, awaiting response from [facility].

## 2016-03-10 NOTE — Telephone Encounter (Signed)
Received fax confirmation on 03/10/2016 at 4:23 PM.

## 2016-03-10 NOTE — Telephone Encounter (Signed)
Informed by [name], Pt's daughter, that Pt has nebulizer and supplies, only needing Xopenex. Order form, Rx, snapshot, insurance cards, and OV notes from 02/18/2016 faxed to [facility] at [phone].

## 2016-03-18 ENCOUNTER — Encounter: Payer: Self-pay | Admitting: Internal Medicine

## 2016-03-20 ENCOUNTER — Telehealth: Payer: Self-pay | Admitting: Internal Medicine

## 2016-03-20 NOTE — Telephone Encounter (Signed)
[name] from [facility] - [phone] she says that they received a prescription for albuterol. She says that they do not carry that medication.

## 2016-03-20 NOTE — Telephone Encounter (Signed)
Please advise patient assistance

## 2016-03-20 NOTE — Telephone Encounter (Signed)
Received call from [facility] [place], they do not supply Xopenox nebulizer medication. Can any other medication be recommended due to $$$. If not will recommend patient assistance program to Pt's daughter [name].

## 2016-03-20 NOTE — Telephone Encounter (Signed)
MyChart message sent to Pt's daughter, [name].

## 2016-03-31 ENCOUNTER — Ambulatory Visit (INDEPENDENT_AMBULATORY_CARE_PROVIDER_SITE_OTHER): Payer: Medicare Other | Admitting: *Deleted

## 2016-03-31 DIAGNOSIS — I4891 Unspecified atrial fibrillation: Secondary | ICD-10-CM | POA: Diagnosis not present

## 2016-03-31 DIAGNOSIS — Z23 Encounter for immunization: Secondary | ICD-10-CM | POA: Diagnosis not present

## 2016-03-31 DIAGNOSIS — Z5181 Encounter for therapeutic drug level monitoring: Secondary | ICD-10-CM | POA: Diagnosis not present

## 2016-03-31 LAB — POCT INR: INR: 2.6

## 2016-03-31 NOTE — Progress Notes (Addendum)
Pre visit review using our clinic review tool, if applicable. No additional management support is needed unless otherwise documented below in the visit note.  Pt here for INR check and flu shot. Patient tolerated injection well.  Dorrene German, RN   Kathlene November, MD

## 2016-04-07 ENCOUNTER — Other Ambulatory Visit: Payer: Self-pay | Admitting: Internal Medicine

## 2016-04-28 ENCOUNTER — Ambulatory Visit: Payer: Medicare Other

## 2016-05-05 ENCOUNTER — Ambulatory Visit: Payer: Medicare Other

## 2016-05-08 ENCOUNTER — Ambulatory Visit (INDEPENDENT_AMBULATORY_CARE_PROVIDER_SITE_OTHER): Payer: Medicare Other | Admitting: Internal Medicine

## 2016-05-08 ENCOUNTER — Encounter: Payer: Self-pay | Admitting: Internal Medicine

## 2016-05-08 VITALS — BP 122/78 | HR 87 | Temp 97.5°F | Resp 14 | Ht 63.0 in | Wt 163.2 lb

## 2016-05-08 DIAGNOSIS — I482 Chronic atrial fibrillation, unspecified: Secondary | ICD-10-CM

## 2016-05-08 DIAGNOSIS — M5136 Other intervertebral disc degeneration, lumbar region: Secondary | ICD-10-CM

## 2016-05-08 DIAGNOSIS — J45901 Unspecified asthma with (acute) exacerbation: Secondary | ICD-10-CM

## 2016-05-08 LAB — POCT INR: INR: 2.4

## 2016-05-08 MED ORDER — CEFUROXIME AXETIL 500 MG PO TABS: 500.0000 mg | ORAL_TABLET | Freq: Two times a day (BID) | ORAL | 0 refills | Status: DC

## 2016-05-08 MED ORDER — ALBUTEROL SULFATE (2.5 MG/3ML) 0.083% IN NEBU: 2.5000 mg | INHALATION_SOLUTION | Freq: Three times a day (TID) | RESPIRATORY_TRACT | 5 refills | Status: DC | PRN

## 2016-05-08 MED ORDER — PREDNISONE 10 MG PO TABS: ORAL_TABLET | ORAL | 0 refills | Status: DC

## 2016-05-08 MED ORDER — FENTANYL 50 MCG/HR TD PT72: 50.0000 ug | MEDICATED_PATCH | TRANSDERMAL | 0 refills | Status: DC

## 2016-05-08 NOTE — Progress Notes (Signed)
Subjective:    Patient ID: [name], female    DOB: Feb 28, 1927, 82 y.o.   MRN: [id number]  DOS:  05/08/2016 Type of visit - description : Acute visit, here with her daughter. Interval history: Developed cough and low-grade fever 2 weeks ago, difficulty breathing has increased, now getting short of breath with ambulation within her house. Sputum production has increased some. Taking her regular meds Good coumadin compliance, due for an INR Pain mgmt- sx controlled    Review of Systems  Denies sinus pain or congestion Some chills No chest pain, lower extremity edema No hemoptysis  Past Medical History:  Diagnosis Date  . Acute encephalopathy   . Acute renal failure superimposed on stage 3 chronic kidney disease ([place])   . Anemia   . Atrial fibrillation ([place])   . Colon cancer ([place]) 12/11  . Colon cancer ([place]) 2011  . Diarrhea   . DVT (deep venous thrombosis) ([place]) 11/06  . Elevated brain natriuretic peptide (BNP) level   . Frequent headaches   . Heart murmur   . Hyperkalemia   . Hypertension   . Pulmonary embolism ([place]) 11/06  . Seizures ([place])   . Stroke [facility]) 11/30/2014    Past Surgical History:  Procedure Laterality Date  . APPENDECTOMY  1952  . COLECTOMY  06/2010   partial, Dr. Donne Hazel complicated by LLE CVT; S/P IVC umbrella & anemia  . HIP FRACTURE SURGERY  2006   Trauma  . TOTAL ABDOMINAL HYSTERECTOMY W/ BILATERAL SALPINGOOPHORECTOMY  1973   For Fibroids  . TOTAL HIP ARTHROPLASTY  01/03/07   Left hip replacement.  Marland Kitchen VENA CAVA FILTER PLACEMENT  06/28/2010    Social History   Social History  . Marital status: Widowed    Spouse name: N/A  . Number of children: 1  . Years of education: N/A   Occupational History  . n/d Retired   Social History Main Topics  . Smoking status: Never Smoker  . Smokeless tobacco: Never Used  . Alcohol use No  . Drug use: No  . Sexual activity: Not on file   Other Topics Concern  . Not on file   Social History  Narrative   Lives w/ her daughter         Medication List       Accurate as of 05/08/16 11:59 PM. Always use your most recent med list.          acetaminophen 500 MG tablet Commonly known as:  TYLENOL Take 500 mg by mouth every 8 (eight) hours as needed for mild pain.   albuterol 108 (90 Base) MCG/ACT inhaler Commonly known as:  PROAIR HFA Inhale 2 puffs into the lungs every 6 (six) hours as needed for wheezing or shortness of breath.   albuterol (2.5 MG/3ML) 0.083% nebulizer solution Commonly known as:  PROVENTIL Take 3 mLs (2.5 mg total) by nebulization 3 (three) times daily as needed for wheezing or shortness of breath.   ALPRAZolam 0.25 MG tablet Commonly known as:  XANAX Take 1 tablet (0.25 mg total) by mouth 2 (two) times daily as needed for anxiety.   aspirin 81 MG tablet Take 81 mg by mouth daily.   atorvastatin 20 MG tablet Commonly known as:  LIPITOR Take 1 tablet (20 mg total) by mouth daily.   CALCIUM 1200 PO Take 2 tablets by mouth daily.   cefUROXime 500 MG tablet Commonly known as:  CEFTIN Take 1 tablet (500 mg total) by mouth 2 (two) times  daily with a meal.   fentaNYL 50 MCG/HR Commonly known as:  DURAGESIC - dosed mcg/hr Place 1 patch (50 mcg total) onto the skin every 3 (three) days.   Fluticasone-Salmeterol 100-50 MCG/DOSE Aepb Commonly known as:  ADVAIR DISKUS Inhale 1 puff into the lungs 2 (two) times daily.   furosemide 40 MG tablet Commonly known as:  LASIX Take 1 tablet (40 mg total) by mouth every Monday, Wednesday, and Friday.   loperamide 2 MG tablet Commonly known as:  IMODIUM A-D Give 2 mg by mouth with first loose stool. Give 2 mg by mouth for additional loose stool.   loratadine 10 MG tablet Commonly known as:  CLARITIN Take 10 mg by mouth daily.   metoprolol tartrate 25 MG tablet Commonly known as:  LOPRESSOR Take 1 tablet (25 mg total) by mouth 2 (two) times daily.   MULTIPLE VITAMINS/WOMENS PO Take by mouth  daily.   predniSONE 10 MG tablet Commonly known as:  DELTASONE 2 tabs a day x 5 days   promethazine 12.5 MG tablet Commonly known as:  PHENERGAN Take 1 tablet (12.5 mg total) by mouth 3 (three) times daily as needed for nausea or vomiting.   traMADol 50 MG tablet Commonly known as:  ULTRAM Take 1 tablet (50 mg total) by mouth every 6 (six) hours as needed. for pain   verapamil 120 MG CR tablet Commonly known as:  CALAN-SR Take 120 mg by mouth at bedtime.   warfarin 2.5 MG tablet Commonly known as:  COUMADIN Take as directed by coumadin clinic.          Objective:   Physical Exam BP 122/78 (BP Location: Right Arm, Patient Position: Sitting, Cuff Size: Small)   Pulse 87   Temp 97.5 F (36.4 C) (Oral)   Resp 14   Ht 5\' 3"  (1.6 m)   Wt 163 lb 4 oz (74 kg)   SpO2 98%   BMI 28.92 kg/m  General:   Well developed, well nourished . NAD.  HEENT:  Normocephalic . Face symmetric, atraumatic. Throat symmetric, no red, nose is slightly congested, sinuses no TTP Lungs:  Decreased breath sounds but clear Normal respiratory effort, no intercostal retractions, no accessory muscle use. Heart: irregular.  No pretibial edema bilaterally  Skin: Not pale. Not jaundice Neurologic:  alert & oriented X3.  Speech normal, gait appropriate for age and unassisted Psych--  Cognition and judgment appear intact.  Cooperative with normal attention span and concentration.  Behavior appropriate. No anxious or depressed appearing.     Assessment & Plan:     Assessment (transfer Dr Linna Darner [phone]) HTN Hyperlipidemia insomnia - xanax prn Asthma  Atrial fibrillation--rate controlled, anticoag Stroke 11-2014, no residual deficits (TIA?) Heart murmur: Mitral regurgitation DVT and pulmonary emboli 2006, vena cava filter 2011  MSK: tramadol qd  --DJD --Back pain has seen Dr Nelva Bush, dr Rolena Infante, MRI: djd, stenosis, med treatment Colon cancer 2011 -- Dr Annitta Jersey , d/c from oncology 06-2015    PLAN Asthma exacerbation: c/o diff breathing-- vital signs are stable, O2 sat 98%, lungs clear. Based on history she has exacerbation, will prescribe Ceftin, prednisone. Continue Advair and albuterol inhaler. She was unable to afford Xopenex, intolerant to Atrovent ; pt and daughter  strongly requests albuterol nebs in case she has severe sx b/c when that happens she can't use inhalers. I will prescribe albuterol nebulizations, they understand the risk of triggering atrial fibrillation, see instructions. Pain mgmt: RF  fentanyl. Coumadin management-A fib: INR today 2.4, same  Coumadin, Next 4 weeks

## 2016-05-08 NOTE — Progress Notes (Signed)
Pre visit review using our clinic review tool, if applicable. No additional management support is needed unless otherwise documented below in the visit note. 

## 2016-05-08 NOTE — Patient Instructions (Addendum)
Take Robitussin-DM OTC as needed  Continue Advair  Albuterol inhaler as prescribed  Take Ceftin for one week  Take prednisone for 5 days  Call if severe symptoms, high fever, chest pain, increased difficulty breathing . Call if not back to normal in 10-14 days.  Use the albuterol nebulization only for severe cases, it may increase your heart rate, if you develop chest pain or palpitations you need to let us know or go to the ER.   Same Coumadin, next Coumadin check in one month

## 2016-05-10 NOTE — Assessment & Plan Note (Signed)
Asthma exacerbation: c/o diff breathing-- vital signs are stable, O2 sat 98%, lungs clear. Based on history she has exacerbation, will prescribe Ceftin, prednisone. Continue Advair and albuterol inhaler. She was unable to afford Xopenex, intolerant to Atrovent ; pt and daughter  strongly requests albuterol nebs in case she has severe sx b/c when that happens she can't use inhalers. I will prescribe albuterol nebulizations, they understand the risk of triggering atrial fibrillation, see instructions. Pain mgmt: RF  fentanyl. Coumadin management-A fib: INR today 2.4, same Coumadin, Next 4 weeks

## 2016-06-10 ENCOUNTER — Other Ambulatory Visit: Payer: Self-pay | Admitting: Internal Medicine

## 2016-06-18 ENCOUNTER — Encounter: Payer: Self-pay | Admitting: *Deleted

## 2016-06-18 ENCOUNTER — Telehealth: Payer: Self-pay | Admitting: *Deleted

## 2016-06-18 NOTE — Telephone Encounter (Signed)
Scheduled for 06/23/16 @2pm .

## 2016-06-18 NOTE — Telephone Encounter (Signed)
This encounter was created in error - please disregard.

## 2016-06-19 ENCOUNTER — Encounter: Payer: Self-pay | Admitting: Cardiovascular Disease

## 2016-06-22 NOTE — Progress Notes (Deleted)
Subjective:   [name] is a 82 y.o. female who presents for an Initial Medicare Annual Wellness Visit.  Review of Systems    No ROS.  Medicare Wellness Visit.     Sleep patterns: {SX; SLEEP PATTERNS:18802}.   Home Safety/Smoke Alarms:   Living environment; residence and Firearm Safety: {Rehab home environment / accessibility:30080}. Seat Belt Safety/Bike Helmet:   Counseling:   Eye Exam-  Dental-  Female:   Pap-       Mammo-       Dexa scan-        CCS- Last 06/16/10: abnormal. Repeat 06/2011 per report.        Objective:    There were no vitals filed for this visit. There is no height or weight on file to calculate BMI.   Current Medications (verified) Outpatient Encounter Prescriptions as of 06/23/2016  Medication Sig  . acetaminophen (TYLENOL) 500 MG tablet Take 500 mg by mouth every 8 (eight) hours as needed for mild pain.   Marland Kitchen albuterol (PROAIR HFA) 108 (90 Base) MCG/ACT inhaler Inhale 2 puffs into the lungs every 6 (six) hours as needed for wheezing or shortness of breath.  Marland Kitchen albuterol (PROVENTIL) (2.5 MG/3ML) 0.083% nebulizer solution Take 3 mLs (2.5 mg total) by nebulization 3 (three) times daily as needed for wheezing or shortness of breath.  . ALPRAZolam (XANAX) 0.25 MG tablet Take 1 tablet (0.25 mg total) by mouth 2 (two) times daily as needed for anxiety.  Marland Kitchen aspirin 81 MG tablet Take 81 mg by mouth daily.  Marland Kitchen atorvastatin (LIPITOR) 20 MG tablet Take 1 tablet (20 mg total) by mouth daily.  . Calcium Carbonate-Vit D-Min (CALCIUM 1200 PO) Take 2 tablets by mouth daily.   . cefUROXime (CEFTIN) 500 MG tablet Take 1 tablet (500 mg total) by mouth 2 (two) times daily with a meal.  . fentaNYL (DURAGESIC - DOSED MCG/HR) 50 MCG/HR Place 1 patch (50 mcg total) onto the skin every 3 (three) days.  . Fluticasone-Salmeterol (ADVAIR DISKUS) 100-50 MCG/DOSE AEPB Inhale 1 puff into the lungs 2 (two) times daily.  . furosemide (LASIX) 40 MG tablet Take 1 tablet (40 mg  total) by mouth every Monday, Wednesday, and Friday. (Patient taking differently: Take 40 mg by mouth daily. )  . loperamide (IMODIUM A-D) 2 MG tablet Give 2 mg by mouth with first loose stool. Give 2 mg by mouth for additional loose stool.  Marland Kitchen loratadine (CLARITIN) 10 MG tablet Take 10 mg by mouth daily.  . metoprolol tartrate (LOPRESSOR) 25 MG tablet Take 1 tablet (25 mg total) by mouth 2 (two) times daily.  . Multiple Vitamins-Minerals (MULTIPLE VITAMINS/WOMENS PO) Take by mouth daily.  . predniSONE (DELTASONE) 10 MG tablet 2 tabs a day x 5 days  . promethazine (PHENERGAN) 12.5 MG tablet Take 1 tablet (12.5 mg total) by mouth 3 (three) times daily as needed for nausea or vomiting.  . traMADol (ULTRAM) 50 MG tablet Take 1 tablet (50 mg total) by mouth every 6 (six) hours as needed. for pain  . verapamil (CALAN-SR) 120 MG CR tablet Take 120 mg by mouth at bedtime.  Marland Kitchen warfarin (COUMADIN) 2.5 MG tablet Take as directed by coumadin clinic.   No facility-administered encounter medications on file as of 06/23/2016.     Allergies (verified) Patient has no known allergies.   History: Past Medical History:  Diagnosis Date  . Acute encephalopathy   . Acute renal failure superimposed on stage 3 chronic kidney disease ([place])   .  Anemia   . Atrial fibrillation ([place])   . Colon cancer ([place]) 12/11  . Colon cancer ([place]) 2011  . Diarrhea   . DVT (deep venous thrombosis) ([place]) 11/06  . Elevated brain natriuretic peptide (BNP) level   . Frequent headaches   . Heart murmur   . Hyperkalemia   . Hypertension   . Pulmonary embolism ([place]) 11/06  . Seizures ([place])   . Stroke [facility]) 11/30/2014   Past Surgical History:  Procedure Laterality Date  . APPENDECTOMY  1952  . COLECTOMY  06/2010   partial, Dr. Donne Hazel complicated by LLE CVT; S/P IVC umbrella & anemia  . HIP FRACTURE SURGERY  2006   Trauma  . TOTAL ABDOMINAL HYSTERECTOMY W/ BILATERAL SALPINGOOPHORECTOMY  1973   For Fibroids  . TOTAL HIP  ARTHROPLASTY  01/03/07   Left hip replacement.  Marland Kitchen VENA CAVA FILTER PLACEMENT  06/28/2010   Family History  Problem Relation Age of Onset  . Peripheral vascular disease Father   . Heart attack Mother 54  . Heart disease Mother     MI  . Coronary artery disease Sister   . Diabetes Paternal Grandmother   . Coronary artery disease Maternal Grandfather    Social History   Occupational History  . n/d Retired   Social History Main Topics  . Smoking status: Never Smoker  . Smokeless tobacco: Never Used  . Alcohol use No  . Drug use: No  . Sexual activity: Not on file    Tobacco Counseling Counseling given: Not Answered   Activities of Daily Living In your present state of health, do you have any difficulty performing the following activities: 09/16/2015 08/30/2015  Hearing? N N  Vision? N N  Difficulty concentrating or making decisions? N N  Walking or climbing stairs? Y N  Dressing or bathing? Y Y  Doing errands, shopping? Tempie Donning  Some recent data might be hidden    Immunizations and Health Maintenance Immunization History  Administered Date(s) Administered  . Influenza Split 03/30/2012  . Influenza Whole 04/30/2006, 04/20/2008, 04/15/2009  . Influenza, High Dose Seasonal PF 04/20/2013, 03/27/2015, 03/31/2016  . Influenza,inj,Quad PF,36+ Mos 05/23/2014  . Pneumococcal Conjugate-13 04/24/2015  . Pneumococcal Polysaccharide-23 07/06/2009   Health Maintenance Due  Topic Date Due  . TETANUS/TDAP  11[birth year]27/1945  . DEXA SCAN  11/26/1993    Patient Care Team: Colon Branch, MD as PCP - General (Internal Medicine)  Indicate any recent Medical Services you may have received from other than [facility] providers in the past year (date may be approximate).     Assessment:   This is a routine wellness examination for [name]. Physical assessment deferred to PCP.   Hearing/Vision screen No exam data present  Dietary issues and exercise activities discussed:   Diet (meal preparation,  eat out, water intake, caffeinated beverages, dairy products, fruits and vegetables): {Desc; diets:16563} Breakfast: Lunch:  Dinner:      Goals    None     Depression Screen PHQ 2/9 Scores 08/14/2015 04/24/2015 08/01/2014  PHQ - 2 Score 0 0 0    Fall Risk Fall Risk  08/14/2015 04/24/2015 08/01/2014 06/12/2014  Falls in the past year? No No No No    Cognitive Function:        Screening Tests Health Maintenance  Topic Date Due  . TETANUS/TDAP  11/27/1945  . DEXA SCAN  11/26/1993  . INFLUENZA VACCINE  Completed  . PNA vac Low Risk Adult  Completed      Plan:   ***  During the course of the visit, Hollice was educated and counseled about the following appropriate screening and preventive services:   Vaccines to include Pneumoccal, Influenza, Hepatitis B, Td, Zostavax, HCV  Cardiovascular disease screening  Colorectal cancer screening  Bone density screening  Diabetes screening  Glaucoma screening  Mammography/PAP  Nutrition counseling  Smoking cessation counseling  Patient Instructions (the written plan) were given to the patient.    Shela Nevin, [place]   06/22/2016

## 2016-06-22 NOTE — Progress Notes (Signed)
Pre visit review using our clinic review tool, if applicable. No additional management support is needed unless otherwise documented below in the visit note.  INR 2.3. All pt findings were negative.Per Dr.Paz: Continue taking Coumadin 2.5 mg daily, except on Thursdays take Coumadin 5 mg. Recheck INR 4 weeks.  Kathlene November, MD

## 2016-06-23 ENCOUNTER — Ambulatory Visit (INDEPENDENT_AMBULATORY_CARE_PROVIDER_SITE_OTHER): Payer: Medicare Other | Admitting: Internal Medicine

## 2016-06-23 ENCOUNTER — Encounter: Payer: Self-pay | Admitting: Internal Medicine

## 2016-06-23 VITALS — BP 124/74 | HR 77 | Temp 97.7°F | Resp 14 | Ht 63.0 in | Wt 163.0 lb

## 2016-06-23 DIAGNOSIS — D649 Anemia, unspecified: Secondary | ICD-10-CM | POA: Diagnosis not present

## 2016-06-23 DIAGNOSIS — E785 Hyperlipidemia, unspecified: Secondary | ICD-10-CM

## 2016-06-23 DIAGNOSIS — Z13820 Encounter for screening for osteoporosis: Secondary | ICD-10-CM

## 2016-06-23 DIAGNOSIS — I10 Essential (primary) hypertension: Secondary | ICD-10-CM | POA: Diagnosis not present

## 2016-06-23 DIAGNOSIS — Z7901 Long term (current) use of anticoagulants: Secondary | ICD-10-CM

## 2016-06-23 DIAGNOSIS — Z5181 Encounter for therapeutic drug level monitoring: Secondary | ICD-10-CM

## 2016-06-23 DIAGNOSIS — I4891 Unspecified atrial fibrillation: Secondary | ICD-10-CM | POA: Diagnosis not present

## 2016-06-23 DIAGNOSIS — Z780 Asymptomatic menopausal state: Secondary | ICD-10-CM

## 2016-06-23 DIAGNOSIS — J45901 Unspecified asthma with (acute) exacerbation: Secondary | ICD-10-CM

## 2016-06-23 LAB — POCT INR: INR: 2.3

## 2016-06-23 NOTE — Progress Notes (Signed)
Subjective:    Patient ID: [name], female    DOB: 1927-04-08, 82 y.o.   MRN: [id number]  DOS:  06/23/2016 Type of visit - description : rov Interval history: Pain mngmt: Currently the well-controlled with fentanyl and tramadol as needed. Asthma: Status post a recent exacerbation, back to baseline. HTN: good compliance, ambulatory BPs always within normal High cholesterol: On statins, due for labs   Review of Systems Denies chest pain or difficulty breathing No nausea, vomiting, diarrhea or blood in the stools. No gross hematuria.   Past Medical History:  Diagnosis Date  . Acute encephalopathy   . Acute renal failure superimposed on stage 3 chronic kidney disease ([place])   . Anemia   . Atrial fibrillation ([place])   . Colon cancer ([place]) 12/11  . Colon cancer ([place]) 2011  . Diarrhea   . DVT (deep venous thrombosis) ([place]) 11/06  . Elevated brain natriuretic peptide (BNP) level   . Frequent headaches   . Heart murmur   . Hyperkalemia   . Hypertension   . Pulmonary embolism ([place]) 11/06  . Seizures ([place])   . Stroke [facility]) 11/30/2014    Past Surgical History:  Procedure Laterality Date  . APPENDECTOMY  1952  . COLECTOMY  06/2010   partial, Dr. Donne Hazel complicated by LLE CVT; S/P IVC umbrella & anemia  . HIP FRACTURE SURGERY  2006   Trauma  . TOTAL ABDOMINAL HYSTERECTOMY W/ BILATERAL SALPINGOOPHORECTOMY  1973   For Fibroids  . TOTAL HIP ARTHROPLASTY  01/03/07   Left hip replacement.  Marland Kitchen VENA CAVA FILTER PLACEMENT  06/28/2010    Social History   Social History  . Marital status: Widowed    Spouse name: N/A  . Number of children: 1  . Years of education: N/A   Occupational History  . n/d Retired   Social History Main Topics  . Smoking status: Never Smoker  . Smokeless tobacco: Never Used  . Alcohol use No  . Drug use: No  . Sexual activity: Not on file   Other Topics Concern  . Not on file   Social History Narrative   Lives w/ her daughter        Allergies as of 06/23/2016   No Known Allergies     Medication List       Accurate as of 06/23/16 11:59 PM. Always use your most recent med list.          acetaminophen 500 MG tablet Commonly known as:  TYLENOL Take 500 mg by mouth every 8 (eight) hours as needed for mild pain.   albuterol 108 (90 Base) MCG/ACT inhaler Commonly known as:  PROAIR HFA Inhale 2 puffs into the lungs every 6 (six) hours as needed for wheezing or shortness of breath.   albuterol (2.5 MG/3ML) 0.083% nebulizer solution Commonly known as:  PROVENTIL Take 3 mLs (2.5 mg total) by nebulization 3 (three) times daily as needed for wheezing or shortness of breath.   ALPRAZolam 0.25 MG tablet Commonly known as:  XANAX Take 1 tablet (0.25 mg total) by mouth 2 (two) times daily as needed for anxiety.   aspirin 81 MG tablet Take 81 mg by mouth daily.   atorvastatin 20 MG tablet Commonly known as:  LIPITOR Take 1 tablet (20 mg total) by mouth daily.   CALCIUM 1200 PO Take 2 tablets by mouth daily.   fentaNYL 50 MCG/HR Commonly known as:  DURAGESIC - dosed mcg/hr Place 1 patch (50 mcg total) onto the  skin every 3 (three) days.   Fluticasone-Salmeterol 100-50 MCG/DOSE Aepb Commonly known as:  ADVAIR DISKUS Inhale 1 puff into the lungs 2 (two) times daily.   furosemide 40 MG tablet Commonly known as:  LASIX Take 1 tablet (40 mg total) by mouth every Monday, Wednesday, and Friday.   loperamide 2 MG tablet Commonly known as:  IMODIUM A-D Give 2 mg by mouth with first loose stool. Give 2 mg by mouth for additional loose stool.   loratadine 10 MG tablet Commonly known as:  CLARITIN Take 10 mg by mouth daily.   metoprolol tartrate 25 MG tablet Commonly known as:  LOPRESSOR Take 1 tablet (25 mg total) by mouth 2 (two) times daily.   MULTIPLE VITAMINS/WOMENS PO Take by mouth daily.   promethazine 12.5 MG tablet Commonly known as:  PHENERGAN Take 1 tablet (12.5 mg total) by mouth 3 (three)  times daily as needed for nausea or vomiting.   traMADol 50 MG tablet Commonly known as:  ULTRAM Take 1 tablet (50 mg total) by mouth every 6 (six) hours as needed. for pain   verapamil 120 MG CR tablet Commonly known as:  CALAN-SR Take 120 mg by mouth at bedtime.   warfarin 2.5 MG tablet Commonly known as:  COUMADIN Take as directed by coumadin clinic.          Objective:   Physical Exam BP 124/74 (BP Location: Right Arm, Patient Position: Sitting, Cuff Size: Small)   Pulse 77   Temp 97.7 F (36.5 C) (Oral)   Resp 14   Ht 5\' 3"  (1.6 m)   Wt 163 lb (73.9 kg)   SpO2 96%   BMI 28.87 kg/m  General:   Well developed, elderly lady, sitting in a wheelchair, no distress HEENT:  Normocephalic . Face symmetric, atraumatic Lungs:  Lightly decreased breath sounds Normal respiratory effort, no intercostal retractions, no accessory muscle use. Heart: irregular No pretibial edema bilaterally  Skin: Not pale. Not jaundice Neurologic:  alert & oriented X3.  Speech normal, moves all extremities Psych--  Cognition and judgment appear intact.  Cooperative with normal attention span and concentration.  Behavior appropriate. No anxious or depressed appearing.      Assessment & Plan:   Assessment (transfer Dr Linna Darner [phone]) HTN Hyperlipidemia insomnia - xanax prn Asthma  Atrial fibrillation--rate controlled, anticoag Stroke 11-2014, no residual deficits (TIA?) Heart murmur: Mitral regurgitation DVT and pulmonary emboli 2006, vena cava filter 2011  MSK: tramadol qd  --DJD --Back pain has seen Dr Nelva Bush, dr Rolena Infante, MRI: djd, stenosis, med treatment Colon cancer 2011 -- Dr Annitta Jersey , d/c from oncology 06-2015 , No further colonoscopies  PLAN HTN: Continue verapamil, Lopressor, Lasix. Check a BMP Hyperlipidemia: Continue Lipitor. Last LFTs normal, check a FLP Asthma: Recent exacerbation resolvedt Atrial fibrillation, rate controlled, INR today Anemia, h/o: Check a CBC MSK   Chronic back pain, well controlled on fentanyl, Ultram. UDS today. History of colon cancer, release from oncology, I review their note from 2015, they agreed on no further colonoscopies.  Primary care: Up-to-date on immunizations, we talk about bone density test, if osteoporotic, she may benefit from treatment for a fracture prevention. Rx DEXA RTC 4-5 months

## 2016-06-23 NOTE — Progress Notes (Signed)
Pre visit review using our clinic review tool, if applicable. No additional management support is needed unless otherwise documented below in the visit note. 

## 2016-06-23 NOTE — Patient Instructions (Addendum)
GO TO THE LAB : Get the blood work  we also need a urine sample for a UDS   GO TO THE FRONT DESK Schedule your next appointment for a  checkup with me in 4-5 months   Will schedule a bone density test    Please see Glenard Haring RN for  your Medicare wellness  --------------------------------------------  Per Dr.Tonie Elsey: Continue taking Coumadin 2.5 mg daily, except on Thursdays take Coumadin 5 mg. Recheck INR 4 weeks.

## 2016-06-24 LAB — CBC WITH DIFFERENTIAL/PLATELET
BASOS PCT: 0.3 % (ref 0.0–3.0)
Basophils Absolute: 0 10*3/uL (ref 0.0–0.1)
EOS PCT: 3 % (ref 0.0–5.0)
Eosinophils Absolute: 0.2 10*3/uL (ref 0.0–0.7)
HCT: 44.1 % (ref 36.0–46.0)
Hemoglobin: 14.6 g/dL (ref 12.0–15.0)
LYMPHS ABS: 1.3 10*3/uL (ref 0.7–4.0)
Lymphocytes Relative: 24.1 % (ref 12.0–46.0)
MCHC: 33.1 g/dL (ref 30.0–36.0)
MCV: 89.2 fl (ref 78.0–100.0)
MONO ABS: 0.4 10*3/uL (ref 0.1–1.0)
MONOS PCT: 6.9 % (ref 3.0–12.0)
NEUTROS PCT: 65.7 % (ref 43.0–77.0)
Neutro Abs: 3.5 10*3/uL (ref 1.4–7.7)
Platelets: 112 10*3/uL — ABNORMAL LOW (ref 150.0–400.0)
RBC: 4.94 Mil/uL (ref 3.87–5.11)
RDW: 14.9 % (ref 11.5–15.5)
WBC: 5.4 10*3/uL (ref 4.0–10.5)

## 2016-06-24 LAB — LIPID PANEL
CHOLESTEROL: 104 mg/dL (ref 0–200)
HDL: 37.9 mg/dL — AB (ref >39.00)
LDL Cholesterol: 56 mg/dL (ref 0–99)
NonHDL: 66.52
TRIGLYCERIDES: 54 mg/dL (ref 0.0–149.0)
Total CHOL/HDL Ratio: 3
VLDL: 10.8 mg/dL (ref 0.0–40.0)

## 2016-06-24 LAB — BASIC METABOLIC PANEL
BUN: 20 mg/dL (ref 6–23)
CO2: 28 mEq/L (ref 19–32)
Calcium: 9.8 mg/dL (ref 8.4–10.5)
Chloride: 105 mEq/L (ref 96–112)
Creatinine, Ser: 1.18 mg/dL (ref 0.40–1.20)
GFR: 45.99 mL/min — AB (ref >60.00)
Glucose, Bld: 88 mg/dL (ref 70–99)
POTASSIUM: 4 meq/L (ref 3.5–5.1)
Sodium: 144 mEq/L (ref 135–145)

## 2016-06-24 NOTE — Assessment & Plan Note (Signed)
HTN: Continue verapamil, Lopressor, Lasix. Check a BMP Hyperlipidemia: Continue Lipitor. Last LFTs normal, check a FLP Asthma: Recent exacerbation resolvedt Atrial fibrillation, rate controlled, INR today Anemia, h/o: Check a CBC MSK  Chronic back pain, well controlled on fentanyl, Ultram. UDS today. History of colon cancer, release from oncology, I review their note from 2015, they agreed on no further colonoscopies.  Primary care: Up-to-date on immunizations, we talk about bone density test, if osteoporotic, she may benefit from treatment for a fracture prevention. Rx DEXA RTC 4-5 months

## 2016-06-25 ENCOUNTER — Encounter: Payer: Self-pay | Admitting: Internal Medicine

## 2016-06-25 ENCOUNTER — Other Ambulatory Visit: Payer: Self-pay | Admitting: Internal Medicine

## 2016-06-25 DIAGNOSIS — Z79891 Long term (current) use of opiate analgesic: Secondary | ICD-10-CM | POA: Diagnosis not present

## 2016-06-25 NOTE — Telephone Encounter (Signed)
Rx's printed, awaiting MD signature.  

## 2016-06-25 NOTE — Telephone Encounter (Signed)
Rx's faxed to [facility] pharmacy.  

## 2016-06-25 NOTE — Telephone Encounter (Signed)
Okay #30 of each, 1 RF

## 2016-06-25 NOTE — Telephone Encounter (Signed)
Pt is requesting refill on Tramadol and Phenergan.  Last OV: 06/23/2016 Last Fill on Phenergan: 10/21/2015 #20 and 0RF Last Fill on Tramadol: 09/03/2015 #30 and 3RF UDS: 04/24/2015 Low risk  Please advise.

## 2016-07-07 ENCOUNTER — Telehealth: Payer: Self-pay

## 2016-07-07 NOTE — Telephone Encounter (Signed)
UDS: 06/25/2016  Negative for Tramadol: PRN Negative for Fentanyl: PRN   Low risk per Dr. Larose Kells 07/07/2016

## 2016-07-08 ENCOUNTER — Other Ambulatory Visit: Payer: Self-pay | Admitting: Internal Medicine

## 2016-07-10 ENCOUNTER — Encounter: Payer: Self-pay | Admitting: Cardiovascular Disease

## 2016-07-10 ENCOUNTER — Ambulatory Visit (INDEPENDENT_AMBULATORY_CARE_PROVIDER_SITE_OTHER): Payer: Medicare Other | Admitting: Cardiovascular Disease

## 2016-07-10 VITALS — BP 134/70 | HR 94 | Ht 65.0 in | Wt 159.4 lb

## 2016-07-10 DIAGNOSIS — I481 Persistent atrial fibrillation: Secondary | ICD-10-CM | POA: Diagnosis not present

## 2016-07-10 DIAGNOSIS — I340 Nonrheumatic mitral (valve) insufficiency: Secondary | ICD-10-CM

## 2016-07-10 DIAGNOSIS — I4819 Other persistent atrial fibrillation: Secondary | ICD-10-CM

## 2016-07-10 NOTE — Patient Instructions (Signed)

## 2016-07-10 NOTE — Progress Notes (Signed)
Chief Complaint  Patient presents with  . 12 month f/u   History of Present Illness: 83 yo female with history of paroxysmal atrial fibrillation, moderate LVH, mild MR, anemia, DVT/PE s/p IVC filter, colon cancer s/p colectomy here today for follow up. I saw her as a new patient for evaluation of irregular heart rhythm in February 2013. She had palpitations starting in 2011 and was found to be anemic at that time and then her colon cancer was found. This was complicated by DVT and PE. She had an IVC filter placed in December 2011. She was started on coumadin back in 2006 after a traumatic hip injury and this was restarted in October of 2011. She has had no bleeding issues since then. Echo 10/07/11 with normal LV function, mild LVH, mild MR, severe biatrial enlargement, PA pressure 54-57mmHg. She had a CVA in May 2016 and was admitted to [facility]. She completely recovered from her CVA. Her INR was therapeutic at the time. Echo May 2016 at [facility] with normal LV function, severe TR, mild to moderate MR, severe biatrial enlargement. Carotid dopplers at [facility] with no evidence of carotid disease.   She is here today for follow up and doing well. No chest pain, SOB, palpitations, near syncope or syncope. She was admitted to [facility] February 2017 with elevated INR, epistaxis.   Primary Care Physician: Kathlene November, MD  Past Medical History:  Diagnosis Date  . Acute encephalopathy   . Acute renal failure superimposed on stage 3 chronic kidney disease ([place])   . Anemia   . Atrial fibrillation ([place])   . Colon cancer ([place]) 12/11  . Colon cancer ([place]) 2011  . Diarrhea   . DVT (deep venous thrombosis) ([place]) 11/06  . Elevated brain natriuretic peptide (BNP) level   . Frequent headaches   . Heart murmur   . Hyperkalemia   . Hypertension   . Pulmonary embolism ([place]) 11/06  . Seizures ([place])   . Stroke [facility]) 11/30/2014    Past Surgical History:  Procedure Laterality Date  .  APPENDECTOMY  1952  . COLECTOMY  06/2010   partial, Dr. Donne Hazel complicated by LLE CVT; S/P IVC umbrella & anemia  . HIP FRACTURE SURGERY  2006   Trauma  . TOTAL ABDOMINAL HYSTERECTOMY W/ BILATERAL SALPINGOOPHORECTOMY  1973   For Fibroids  . TOTAL HIP ARTHROPLASTY  01/03/07   Left hip replacement.  Marland Kitchen VENA CAVA FILTER PLACEMENT  06/28/2010    Current Outpatient Prescriptions  Medication Sig Dispense Refill  . acetaminophen (TYLENOL) 500 MG tablet Take 500 mg by mouth every 8 (eight) hours as needed for mild pain.     Marland Kitchen albuterol (PROAIR HFA) 108 (90 Base) MCG/ACT inhaler Inhale 2 puffs into the lungs every 6 (six) hours as needed for wheezing or shortness of breath. 18 g 5  . albuterol (PROVENTIL) (2.5 MG/3ML) 0.083% nebulizer solution Take 3 mLs (2.5 mg total) by nebulization 3 (three) times daily as needed for wheezing or shortness of breath. 150 mL 5  . ALPRAZolam (XANAX) 0.25 MG tablet Take 1 tablet (0.25 mg total) by mouth 2 (two) times daily as needed for anxiety. 60 tablet 3  . aspirin 81 MG tablet Take 81 mg by mouth daily.    Marland Kitchen atorvastatin (LIPITOR) 20 MG tablet Take 1 tablet (20 mg total) by mouth daily. 90 tablet 1  . Calcium Carbonate-Vit D-Min (CALCIUM 1200 PO) Take 2 tablets by mouth daily.     . fentaNYL (  DURAGESIC - DOSED MCG/HR) 50 MCG/HR Place 1 patch (50 mcg total) onto the skin every 3 (three) days. 10 patch 0  . Fluticasone-Salmeterol (ADVAIR DISKUS) 100-50 MCG/DOSE AEPB Inhale 1 puff into the lungs 2 (two) times daily. 60 each 5  . furosemide (LASIX) 40 MG tablet Take 1 tablet (40 mg total) by mouth every Monday, Wednesday, and Friday. (Patient taking differently: Take 40 mg by mouth daily. ) 30 tablet 0  . loperamide (IMODIUM A-D) 2 MG tablet Give 2 mg by mouth with first loose stool. Give 2 mg by mouth for additional loose stool.    Marland Kitchen loratadine (CLARITIN) 10 MG tablet Take 10 mg by mouth daily.    . metoprolol tartrate (LOPRESSOR) 25 MG tablet Take 1 tablet (25 mg  total) by mouth 2 (two) times daily. 180 tablet 1  . Multiple Vitamins-Minerals (MULTIPLE VITAMINS/WOMENS PO) Take by mouth daily.    . promethazine (PHENERGAN) 12.5 MG tablet Take 1 tablet (12.5 mg total) by mouth 3 (three) times daily as needed for nausea or vomiting. 30 tablet 1  . traMADol (ULTRAM) 50 MG tablet Take 1 tablet (50 mg total) by mouth every 6 (six) hours as needed for moderate pain. 30 tablet 1  . verapamil (CALAN-SR) 120 MG CR tablet Take 120 mg by mouth at bedtime.    Marland Kitchen warfarin (COUMADIN) 2.5 MG tablet Take as directed by coumadin clinic. 30 tablet 1   No current facility-administered medications for this visit.     No Known Allergies  Social History   Social History  . Marital status: Widowed    Spouse name: N/A  . Number of children: 1  . Years of education: N/A   Occupational History  . n/d Retired   Social History Main Topics  . Smoking status: Never Smoker  . Smokeless tobacco: Never Used  . Alcohol use No  . Drug use: No  . Sexual activity: Not on file   Other Topics Concern  . Not on file   Social History Narrative   Lives w/ her daughter     Family History  Problem Relation Age of Onset  . Peripheral vascular disease Father   . Heart attack Mother 32  . Heart disease Mother     MI  . Coronary artery disease Sister   . Diabetes Paternal Grandmother   . Coronary artery disease Maternal Grandfather     Review of Systems:  As stated in the HPI and otherwise negative.   BP 134/70   Pulse 94   Ht 5\' 5"  (1.651 m)   Wt 159 lb 6.4 oz (72.3 kg)   SpO2 94%   BMI 26.53 kg/m   Physical Examination: General: Well developed, well nourished, NAD  HEENT: OP clear, mucus membranes moist  SKIN: warm, dry. No rashes. Neuro: No focal deficits  Musculoskeletal: Muscle strength 5/5 all ext  Psychiatric: Mood and affect normal  Neck: No JVD, no carotid bruits, no thyromegaly, no lymphadenopathy.  Lungs:Clear bilaterally, no wheezes, rhonci,  crackles Cardiovascular: Irregular irregular. Systolic murmur. No gallops or rubs. Abdomen:Soft. Bowel sounds present. Non-tender.  Extremities: No lower extremity edema. Pulses are 2 + in the bilateral DP/PT.  Echo May 2017:   Left ventricle: The cavity size was normal. There was mild   concentric hypertrophy. Systolic function was normal. The   estimated ejection fraction was in the range of 55% to 60%. Wall   motion was normal; there were no regional wall motion   abnormalities. - Ventricular  septum: The contour showed systolic flattening   consistent with RV pressure overload. - Aortic valve: Transvalvular velocity was within the normal range.   There was no stenosis. There was no regurgitation. - Mitral valve: Transvalvular velocity was within the normal range.   There was no evidence for stenosis. There was mild regurgitation. - Left atrium: The atrium was severely dilated. - Right ventricle: Systolic function was normal. RV systolic   pressure (S, est): 62 mm Hg. - Right atrium: The atrium was severely dilated. - Atrial septum: A patent foramen ovale cannot be excluded. - Tricuspid valve: There was moderate regurgitation. - Pulmonary arteries: Systolic pressure was severely increased. PA   peak pressure: 62 mm Hg (S). - Inferior vena cava: The vessel was dilated. The respirophasic   diameter changes were blunted (< 50%), consistent with elevated   central venous pressure.  EKG:  EKG is ordered today. The ekg ordered today demonstrates Atrial fib, rate 93 bpm.LVH. Non-specific T wave abnormality.   Recent Labs: 09/01/2015: Magnesium 1.9 09/16/2015: ALT 12; B Natriuretic Peptide 555.0 06/23/2016: BUN 20; Creatinine, Ser 1.18; Hemoglobin 14.6; Platelets 112.0; Potassium 4.0; Sodium 144   Lipid Panel    Component Value Date/Time   CHOL 104 06/23/2016 1503   TRIG 54.0 06/23/2016 1503   HDL 37.90 (L) 06/23/2016 1503   CHOLHDL 3 06/23/2016 1503   VLDL 10.8 06/23/2016 1503    LDLCALC 56 06/23/2016 1503     Wt Readings from Last 3 Encounters:  07/10/16 159 lb 6.4 oz (72.3 kg)  06/23/16 163 lb (73.9 kg)  05/08/16 163 lb 4 oz (74 kg)     Other studies Reviewed: Additional studies/ records that were reviewed today include: . Review of the above records demonstrates:    Assessment and Plan:   1. ATRIAL FIBRILLATION, permanent: Rate controlled. Cardizem CD stopped for insurance reasons and Verapamil/Lopressor now for rate control. Continue anti-coagulation with coumadin. No bleeding issues. We discussed the novel agents but she does not wish to consider.   2. Mitral regurgitation: Mild to moderate by echo at [facility] May 2016 (see Care Everywhere).   3. History of CVA: May 2016. No residual deficits. Her INR was therapeutic at that time. Per pt, neurology added ASA and statin.   Current medicines are reviewed at length with the patient today.  The patient does not have concerns regarding medicines.  The following changes have been made:  no change  Labs/ tests ordered today include:   Orders Placed This Encounter  Procedures  . EKG 12-Lead    Disposition:   FU with me in 12  months  Signed, Lauree Chandler, MD 07/10/2016 3:58 PM    [address], [place],   [address] Phone: [phone]; Fax: [phone]

## 2016-07-23 ENCOUNTER — Ambulatory Visit: Payer: Medicare Other | Admitting: *Deleted

## 2016-07-28 ENCOUNTER — Ambulatory Visit (INDEPENDENT_AMBULATORY_CARE_PROVIDER_SITE_OTHER): Payer: Medicare Other

## 2016-07-28 DIAGNOSIS — Z7901 Long term (current) use of anticoagulants: Secondary | ICD-10-CM

## 2016-07-28 DIAGNOSIS — Z5181 Encounter for therapeutic drug level monitoring: Secondary | ICD-10-CM

## 2016-07-28 LAB — POCT INR: INR: 1.8

## 2016-07-28 NOTE — Progress Notes (Signed)
Pre visit review using our clinic tool,if applicable. No additional management support is needed unless otherwise documented below in the visit note.   Patient in for INR  Check per Dr. Larose Kells.  INR = 1.8 Patient goal =2.0-3.0.  Patient denies any changes in her medications or diet.  Per Dr. Larose Kells patient to take extra 2.5 mg Coumadin today then continue  Coumadin 2.5 mg  Monday,Tuesday,Wednesday,Friday,Saturday and Sunday and 5 mg on Thursday.  Patient to return for INR recheck in 3 weeks.   Kathlene November, MD

## 2016-07-28 NOTE — Patient Instructions (Signed)
Per Dr. Larose Kells patient to take extra 2.5 mg Coumadin today then continue  Coumadin 2.5 mg  Monday,Tuesday,Wednesday,Friday,Saturday and Sunday and 5 mg on Thursday.  Patient to return for INR recheck in 3 weeks.

## 2016-08-18 ENCOUNTER — Ambulatory Visit (INDEPENDENT_AMBULATORY_CARE_PROVIDER_SITE_OTHER): Payer: Medicare Other

## 2016-08-18 DIAGNOSIS — I4891 Unspecified atrial fibrillation: Secondary | ICD-10-CM

## 2016-08-18 DIAGNOSIS — Z7901 Long term (current) use of anticoagulants: Secondary | ICD-10-CM

## 2016-08-18 DIAGNOSIS — Z5181 Encounter for therapeutic drug level monitoring: Secondary | ICD-10-CM

## 2016-08-18 LAB — POCT INR: INR: 2.5

## 2016-08-18 NOTE — Progress Notes (Addendum)
Pre visit review using our clinic tool,if applicable. No additional management support is needed unless otherwise documented below in the visit note.   Patient in for INR check today INR = 2.5 Goal = 2.0-3.0.   Per Dr. Larose Kells, patient to take Coumadin 2.5 mg Monday,Tuesday,Wednesday Friday and Saturday. Coumadin 5 mg on Thursday. Patient to return to office for INR check in 4 weeks on 09/15/16.   Patient has no complaints,no missed doses, no bruising or bleeding and no diet changes.     Kathlene November, MD

## 2016-08-27 ENCOUNTER — Telehealth: Payer: Self-pay | Admitting: Internal Medicine

## 2016-08-27 NOTE — Telephone Encounter (Signed)
Patient scheduled AWV with Glenard Haring for 10/30/16 at 1pm and follow up with PCP at 2pm

## 2016-09-02 ENCOUNTER — Other Ambulatory Visit: Payer: Self-pay | Admitting: Internal Medicine

## 2016-09-15 ENCOUNTER — Ambulatory Visit: Payer: Medicare Other

## 2016-09-23 ENCOUNTER — Ambulatory Visit: Payer: Medicare Other

## 2016-09-25 ENCOUNTER — Ambulatory Visit: Payer: Medicare Other

## 2016-09-30 ENCOUNTER — Ambulatory Visit (INDEPENDENT_AMBULATORY_CARE_PROVIDER_SITE_OTHER): Payer: Medicare Other | Admitting: Behavioral Health

## 2016-09-30 DIAGNOSIS — I4891 Unspecified atrial fibrillation: Secondary | ICD-10-CM | POA: Diagnosis not present

## 2016-09-30 DIAGNOSIS — Z5181 Encounter for therapeutic drug level monitoring: Secondary | ICD-10-CM | POA: Diagnosis not present

## 2016-09-30 LAB — POCT INR: INR: 2.6

## 2016-09-30 NOTE — Patient Instructions (Signed)
Continue to take Coumadin 2.5 mg each day, except take 5 mg on Thursdays. Return for INR check in 4 weeks.

## 2016-09-30 NOTE — Progress Notes (Addendum)
Pre visit review using our clinic review tool, if applicable. No additional management support is needed unless otherwise documented below in the visit note.  Patient presents in clinic for INR check. She verbalized no changes. All patient findings were negative. Patient reports compliance with current medication regimen. Today's INR reading was 2.6.  Advised patient to continue taking Coumadin 2.5 mg each day, except take 5 mg on Thursdays. Return for INR check in 4 weeks.   She understood the instructions and did not have any concerns before leaving the visit.  Next appointment 10/30/16.  Kathlene November, MD

## 2016-10-28 NOTE — Progress Notes (Signed)
Subjective:   [name] is a 83 y.o. female who presents for an Initial Medicare Annual Wellness Visit.  The Patient was informed that the wellness visit is to identify future health risk and educate and initiate measures that can reduce risk for increased disease through the lifespan.   Describes health as fair, good or great? Fair  Review of Systems    No ROS.  Medicare Wellness Visit. Cardiac Risk Factors include: advanced age (>1men, >110 women);hypertension;dyslipidemia Sleep patterns: Naps occasionally. Sleeps well. Feels rested. Home Safety/Smoke Alarms: Feels safe in home. Smoke alarms in place.    Living environment; residence and Firearm Safety: Lives with daughter and husband. On 1st floor. Guns not discussed. Seat Belt Safety/Bike Helmet: Wears seat belt.   Counseling:   Eye Exam- Hx of cataract sx.  Dental-  Pt states not seeing a dentist currently.   Female:   Pap-  No longer screened due to age.     Mammo- No longer due to age  Dexa scan-  No longer due to age 81- Last 06/16/10: no result on file    Objective:    Today's Vitals   10/30/16 1323  BP: (!) 178/77  Pulse: 83  SpO2: 98%  Weight: 170 lb (77.1 kg)  Height: 5\' 5"  (1.651 m)   Body mass index is 28.29 kg/m.   Current Medications (verified) Outpatient Encounter Prescriptions as of 10/30/2016  Medication Sig  . acetaminophen (TYLENOL) 500 MG tablet Take 500 mg by mouth every 8 (eight) hours as needed for mild pain.   Marland Kitchen albuterol (PROAIR HFA) 108 (90 Base) MCG/ACT inhaler Inhale 2 puffs into the lungs every 6 (six) hours as needed for wheezing or shortness of breath.  Marland Kitchen albuterol (PROVENTIL) (2.5 MG/3ML) 0.083% nebulizer solution Take 3 mLs (2.5 mg total) by nebulization 3 (three) times daily as needed for wheezing or shortness of breath.  . ALPRAZolam (XANAX) 0.25 MG tablet Take 1 tablet (0.25 mg total) by mouth 2 (two) times daily as needed for anxiety.  Marland Kitchen aspirin 81 MG tablet Take 81 mg by  mouth daily.  Marland Kitchen atorvastatin (LIPITOR) 20 MG tablet Take 1 tablet (20 mg total) by mouth daily.  . Calcium Carbonate-Vit D-Min (CALCIUM 1200 PO) Take 2 tablets by mouth daily.   . fentaNYL (DURAGESIC - DOSED MCG/HR) 50 MCG/HR Place 1 patch (50 mcg total) onto the skin every 3 (three) days.  . Fluticasone-Salmeterol (ADVAIR DISKUS) 100-50 MCG/DOSE AEPB Inhale 1 puff into the lungs 2 (two) times daily.  . furosemide (LASIX) 40 MG tablet Take 1 tablet (40 mg total) by mouth every Monday, Wednesday, and Friday. (Patient taking differently: Take 40 mg by mouth daily. )  . loperamide (IMODIUM A-D) 2 MG tablet Give 2 mg by mouth with first loose stool. Give 2 mg by mouth for additional loose stool.  Marland Kitchen loratadine (CLARITIN) 10 MG tablet Take 10 mg by mouth daily.  . metoprolol tartrate (LOPRESSOR) 25 MG tablet Take 1 tablet (25 mg total) by mouth 2 (two) times daily.  . Multiple Vitamins-Minerals (MULTIPLE VITAMINS/WOMENS PO) Take by mouth daily.  . promethazine (PHENERGAN) 12.5 MG tablet Take 1 tablet (12.5 mg total) by mouth 3 (three) times daily as needed for nausea or vomiting.  . traMADol (ULTRAM) 50 MG tablet Take 1 tablet (50 mg total) by mouth every 6 (six) hours as needed for moderate pain.  . verapamil (CALAN-SR) 120 MG CR tablet Take 120 mg by mouth at bedtime.  Marland Kitchen warfarin (COUMADIN)  2.5 MG tablet TAKE AS DIRECTED BY  COUMADIN  CLINIC   No facility-administered encounter medications on file as of 10/30/2016.     Allergies (verified) Patient has no known allergies.   History: Past Medical History:  Diagnosis Date  . Acute encephalopathy   . Acute renal failure superimposed on stage 3 chronic kidney disease ([place])   . Anemia   . Atrial fibrillation ([place])   . Colon cancer ([place]) 12/11  . Colon cancer ([place]) 2011  . Diarrhea   . DVT (deep venous thrombosis) ([place]) 11/06  . Elevated brain natriuretic peptide (BNP) level   . Frequent headaches   . Heart murmur   . Hyperkalemia   .  Hypertension   . Pulmonary embolism ([place]) 11/06  . Seizures ([place])   . Stroke [facility]) 11/30/2014   Past Surgical History:  Procedure Laterality Date  . APPENDECTOMY  1952  . COLECTOMY  06/2010   partial, Dr. Donne Hazel complicated by LLE CVT; S/P IVC umbrella & anemia  . HIP FRACTURE SURGERY  2006   Trauma  . TOTAL ABDOMINAL HYSTERECTOMY W/ BILATERAL SALPINGOOPHORECTOMY  1973   For Fibroids  . TOTAL HIP ARTHROPLASTY  01/03/07   Left hip replacement.  Marland Kitchen VENA CAVA FILTER PLACEMENT  06/28/2010   Family History  Problem Relation Age of Onset  . Peripheral vascular disease Father   . Heart attack Mother 63  . Heart disease Mother     MI  . Coronary artery disease Sister   . Diabetes Paternal Grandmother   . Coronary artery disease Maternal Grandfather    Social History   Occupational History  . n/d Retired   Social History Main Topics  . Smoking status: Never Smoker  . Smokeless tobacco: Never Used  . Alcohol use No  . Drug use: No  . Sexual activity: Not on file    Tobacco Counseling Counseling given: No   Activities of Daily Living In your present state of health, do you have any difficulty performing the following activities: 10/30/2016  Hearing? N  Vision? N  Difficulty concentrating or making decisions? N  Walking or climbing stairs? Y  Dressing or bathing? N  Doing errands, shopping? Y  Preparing Food and eating ? N  Using the Toilet? N  In the past six months, have you accidently leaked urine? Y  Do you have problems with loss of bowel control? N  Managing your Medications? N  Managing your Finances? N  Housekeeping or managing your Housekeeping? N  Some recent data might be hidden    Immunizations and Health Maintenance Immunization History  Administered Date(s) Administered  . Influenza Split 03/30/2012  . Influenza Whole 04/30/2006, 04/20/2008, 04/15/2009  . Influenza, High Dose Seasonal PF 04/20/2013, 03/27/2015, 03/31/2016  . Influenza,inj,Quad  PF,36+ Mos 05/23/2014  . Pneumococcal Conjugate-13 04/24/2015  . Pneumococcal Polysaccharide-23 07/06/2009   Health Maintenance Due  Topic Date Due  . TETANUS/TDAP  [birth year]27/1945  . DEXA SCAN  11/26/1993    Patient Care Team: Colon Branch, MD as PCP - General (Internal Medicine)  Indicate any recent Medical Services you may have received from other than [facility] providers in the past year (date may be approximate).     Assessment:   This is a routine wellness examination for [name]. Physical assessment deferred to PCP.  Hearing/Vision screen No exam data present  Dietary issues and exercise activities discussed: Current Exercise Habits: The patient does not participate in regular exercise at present, Exercise limited by: orthopedic condition(s)  Diet (meal preparation, eat out, water intake, caffeinated beverages, dairy products, fruits and vegetables): in general, a "healthy" diet     Goals    . maintain lifestyle.      Depression Screen PHQ 2/9 Scores 10/30/2016 08/14/2015 04/24/2015 08/01/2014  PHQ - 2 Score 0 0 0 0    Fall Risk Fall Risk  10/30/2016 08/14/2015 04/24/2015 08/01/2014 06/12/2014  Falls in the past year? No No No No No    Cognitive Function: MMSE - Mini Mental State Exam 10/30/2016  Orientation to time 5  Orientation to Place 5  Registration 3  Attention/ Calculation 4  Recall 3  Language- name 2 objects 2  Language- repeat 1  Language- follow 3 step command 3  Language- read & follow direction 1  Write a sentence 1  Copy design 1  Total score 29        Screening Tests Health Maintenance  Topic Date Due  . TETANUS/TDAP  [birth year]27/1945  . DEXA SCAN  11/26/1993  . INFLUENZA VACCINE  02/03/2017  . PNA vac Low Risk Adult  Completed      Plan:     Follow up with Dr.Paz as scheduled today.  Bring a copy of your advance directives to your next office visit.   During the course of the visit, [name] was educated and counseled about the following appropriate  screening and preventive services:   Vaccines to include Pneumoccal, Influenza, Td, HCV  Cardiovascular disease screening  Colorectal cancer screening  Bone density screening  Diabetes screening  Glaucoma screening  Mammography/PAP  Nutrition counseling   Patient Instructions (the written plan) were given to the patient.    Naaman Plummer [place], [place]   10/30/2016    Kathlene November, MD

## 2016-10-28 NOTE — Progress Notes (Signed)
Pre visit review using our clinic review tool, if applicable. No additional management support is needed unless otherwise documented below in the visit note. 

## 2016-10-30 ENCOUNTER — Encounter: Payer: Self-pay | Admitting: Internal Medicine

## 2016-10-30 ENCOUNTER — Ambulatory Visit (INDEPENDENT_AMBULATORY_CARE_PROVIDER_SITE_OTHER): Payer: Medicare Other | Admitting: Internal Medicine

## 2016-10-30 VITALS — BP 152/78 | HR 83 | Ht 65.0 in | Wt 170.0 lb

## 2016-10-30 DIAGNOSIS — I4891 Unspecified atrial fibrillation: Secondary | ICD-10-CM | POA: Diagnosis not present

## 2016-10-30 DIAGNOSIS — J4520 Mild intermittent asthma, uncomplicated: Secondary | ICD-10-CM

## 2016-10-30 DIAGNOSIS — Z0000 Encounter for general adult medical examination without abnormal findings: Secondary | ICD-10-CM

## 2016-10-30 DIAGNOSIS — I10 Essential (primary) hypertension: Secondary | ICD-10-CM

## 2016-10-30 DIAGNOSIS — Z23 Encounter for immunization: Secondary | ICD-10-CM | POA: Diagnosis not present

## 2016-10-30 DIAGNOSIS — Z7901 Long term (current) use of anticoagulants: Secondary | ICD-10-CM | POA: Diagnosis not present

## 2016-10-30 LAB — BASIC METABOLIC PANEL
BUN: 25 mg/dL (ref 7–25)
CHLORIDE: 110 mmol/L (ref 98–110)
CO2: 22 mmol/L (ref 20–31)
Calcium: 9.2 mg/dL (ref 8.6–10.4)
Creat: 1.09 mg/dL — ABNORMAL HIGH (ref 0.60–0.88)
GLUCOSE: 97 mg/dL (ref 65–99)
Potassium: 4.2 mmol/L (ref 3.5–5.3)
Sodium: 145 mmol/L (ref 135–146)

## 2016-10-30 LAB — POCT INR: INR: 2.2

## 2016-10-30 NOTE — Assessment & Plan Note (Addendum)
--  Td- today ; Pneumonia 13:2016; Pneumonia 23:2011; shingles shot: Pro-cons discussed, she is 40. Elected not to proceed. --Colonoscopies: No longer indicated --Breasts  cancer screening no longer indicated --dexa Rx 06/2016: Referral failed , pt declined re-referral

## 2016-10-30 NOTE — Progress Notes (Signed)
Subjective:    Patient ID: [name], female    DOB: 02-Jun-1927, 83 y.o.   MRN: [id number]  DOS:  10/30/2016 Type of visit - description : rov Interval history:  Needs an INR checked. HTN: Good compliance of medication, no ambulatory BPs, BP upon arrival to the office elevated. Pain mgmt -- hardly ever needs pain medication. Asthma: Good compliance of medications.  BP Readings from Last 3 Encounters:  10/30/16 (!) 152/78  07/10/16 134/70  06/23/16 124/74     Review of Systems Denies chest pain or difficulty breathing No nausea, vomiting, diarrhea. No anxiety depression. She did have mild, dull headache yesterday and today Past Medical History:  Diagnosis Date  . Acute encephalopathy   . Acute renal failure superimposed on stage 3 chronic kidney disease ([place])   . Anemia   . Atrial fibrillation ([place])   . Colon cancer ([place]) 12/11  . Colon cancer ([place]) 2011  . Diarrhea   . DVT (deep venous thrombosis) ([place]) 11/06  . Elevated brain natriuretic peptide (BNP) level   . Frequent headaches   . Heart murmur   . Hyperkalemia   . Hypertension   . Pulmonary embolism ([place]) 11/06  . Seizures ([place])   . Stroke [facility]) 11/30/2014    Past Surgical History:  Procedure Laterality Date  . APPENDECTOMY  1952  . COLECTOMY  06/2010   partial, Dr. Donne Hazel complicated by LLE CVT; S/P IVC umbrella & anemia  . HIP FRACTURE SURGERY  2006   Trauma  . TOTAL ABDOMINAL HYSTERECTOMY W/ BILATERAL SALPINGOOPHORECTOMY  1973   For Fibroids  . TOTAL HIP ARTHROPLASTY  01/03/07   Left hip replacement.  Marland Kitchen VENA CAVA FILTER PLACEMENT  06/28/2010    Social History   Social History  . Marital status: Widowed    Spouse name: N/A  . Number of children: 1  . Years of education: N/A   Occupational History  . n/d Retired   Social History Main Topics  . Smoking status: Never Smoker  . Smokeless tobacco: Never Used  . Alcohol use No  . Drug use: No  . Sexual activity: Not on file   Other  Topics Concern  . Not on file   Social History Narrative   Lives w/ her daughter       Allergies as of 10/30/2016   No Known Allergies     Medication List       Accurate as of 10/30/16 11:59 PM. Always use your most recent med list.          acetaminophen 500 MG tablet Commonly known as:  TYLENOL Take 500 mg by mouth every 8 (eight) hours as needed for mild pain.   albuterol 108 (90 Base) MCG/ACT inhaler Commonly known as:  PROAIR HFA Inhale 2 puffs into the lungs every 6 (six) hours as needed for wheezing or shortness of breath.   albuterol (2.5 MG/3ML) 0.083% nebulizer solution Commonly known as:  PROVENTIL Take 3 mLs (2.5 mg total) by nebulization 3 (three) times daily as needed for wheezing or shortness of breath.   ALPRAZolam 0.25 MG tablet Commonly known as:  XANAX Take 1 tablet (0.25 mg total) by mouth 2 (two) times daily as needed for anxiety.   aspirin 81 MG tablet Take 81 mg by mouth daily.   atorvastatin 20 MG tablet Commonly known as:  LIPITOR Take 1 tablet (20 mg total) by mouth daily.   CALCIUM 1200 PO Take 2 tablets by mouth daily.  fentaNYL 50 MCG/HR Commonly known as:  DURAGESIC - dosed mcg/hr Place 1 patch (50 mcg total) onto the skin every 3 (three) days.   Fluticasone-Salmeterol 100-50 MCG/DOSE Aepb Commonly known as:  ADVAIR DISKUS Inhale 1 puff into the lungs 2 (two) times daily.   furosemide 40 MG tablet Commonly known as:  LASIX Take 1 tablet (40 mg total) by mouth every Monday, Wednesday, and Friday.   loperamide 2 MG tablet Commonly known as:  IMODIUM A-D Give 2 mg by mouth with first loose stool. Give 2 mg by mouth for additional loose stool.   loratadine 10 MG tablet Commonly known as:  CLARITIN Take 10 mg by mouth daily.   metoprolol tartrate 25 MG tablet Commonly known as:  LOPRESSOR Take 1 tablet (25 mg total) by mouth 2 (two) times daily.   MULTIPLE VITAMINS/WOMENS PO Take by mouth daily.   promethazine 12.5 MG  tablet Commonly known as:  PHENERGAN Take 1 tablet (12.5 mg total) by mouth 3 (three) times daily as needed for nausea or vomiting.   traMADol 50 MG tablet Commonly known as:  ULTRAM Take 1 tablet (50 mg total) by mouth every 6 (six) hours as needed for moderate pain.   verapamil 120 MG CR tablet Commonly known as:  CALAN-SR Take 120 mg by mouth 2 (two) times daily.   warfarin 2.5 MG tablet Commonly known as:  COUMADIN TAKE AS DIRECTED BY  COUMADIN  CLINIC          Objective:   Physical Exam BP (!) 152/78 (BP Location: Right Arm, Patient Position: Sitting, Cuff Size: Normal)   Pulse 83   Ht 5\' 5"  (1.651 m)   Wt 170 lb (77.1 kg)   SpO2 98%   BMI 28.29 kg/m  General:   Well developed, well nourished, elderly lady in no distress.Marland Kitchen  HEENT:  Normocephalic . Face symmetric, atraumatic Lungs:  CTA B Normal respiratory effort, no intercostal retractions, no accessory muscle use. Heart: Irregularly irregular,  no murmur.  No pretibial edema bilaterally  Skin: Not pale. Not jaundice Neurologic:  alert & oriented X3.  Speech normal, gait not tested, sitting a wheelchair Psych--  Cognition and judgment appear intact.  Cooperative with normal attention span and concentration.  Behavior appropriate. No anxious or depressed appearing.      Assessment & Plan:   Assessment (transfer Dr Linna Darner [phone]) HTN Hyperlipidemia insomnia - xanax prn Asthma  Atrial fibrillation--rate controlled, anticoag Stroke 11-2014, no residual deficits (TIA?) Heart murmur: Mitral regurgitation DVT and pulmonary emboli 2006, vena cava filter 2011  MSK: tramadol qd  --DJD --Back pain has seen Dr Nelva Bush, dr Rolena Infante, MRI: djd, stenosis, med treatment Colon cancer 2011 -- Dr Annitta Jersey , d/c from oncology 06-2015 , No further colonoscopies  PLAN HTN: Initial BP 178/77, recheck BP 152/78. On Lasix 3 times a week, Verapamil CR 120 mg once daily and Lopressor 25 mg BID. HR in the 80s. Used to take  verapamil BID, BP elevated , mild HAs, thus will go back on verapamil BID. Watch BPs and heart rate. Coumadin management: INR 2.2, no change, 4 weeks Asthma: Good med compliance, essentially asx A.  fibrillation:   rate in the 80s, due for elevated BP will increase verapamil. Note from cardiology reviewed. Primary care issues: Discussed, see note under "annual exam". RTC 6 months

## 2016-10-30 NOTE — Patient Instructions (Addendum)
GO TO THE LAB : Get the blood work     GO TO THE FRONT DESK Schedule your next appointment for a  Coumadin check in one month  Next appointment to see me in 6 months  Increase Calan SR to one tablet twice a day, call a refill when needed  Check your blood pressure and heart rate 3 times a week Heart rate should be around 60/m Be sure your blood pressure is between 110/65 and  145/85. If it is consistently higher or lower, let me know         [name] , Thank you for taking time to come for your Medicare Wellness Visit. I appreciate your ongoing commitment to your health goals. Please review the following plan we discussed and let me know if I can assist you in the future.   These are the goals we discussed: Goals    . maintain lifestyle.       This is a list of the screening recommended for you and due dates:  Health Maintenance  Topic Date Due  . Tetanus Vaccine  11/27/1947  . DEXA scan (bone density measurement)  10/30/2017*  . Flu Shot  02/03/2017  . Pneumonia vaccines  Completed  *Topic was postponed. The date shown is not the original due date.   Bring a copy of your advance directives to your next office visit.

## 2016-10-31 NOTE — Assessment & Plan Note (Signed)
HTN: Initial BP 178/77, recheck BP 152/78. On Lasix 3 times a week, Verapamil CR 120 mg once daily and Lopressor 25 mg BID. HR in the 80s. Used to take verapamil BID, BP elevated , mild HAs, thus will go back on verapamil BID. Watch BPs and heart rate. Coumadin management: INR 2.2, no change, 4 weeks Asthma: Good med compliance, essentially asx A.  fibrillation:   rate in the 80s, due for elevated BP will increase verapamil. Note from cardiology reviewed. Primary care issues: Discussed, see note under "annual exam". RTC 6 months

## 2016-11-02 ENCOUNTER — Encounter: Payer: Self-pay | Admitting: Internal Medicine

## 2016-11-04 ENCOUNTER — Other Ambulatory Visit: Payer: Self-pay | Admitting: Internal Medicine

## 2016-11-17 IMAGING — CT CT HEAD W/O CM
1 series · 15 of 30 positions shown, 19 images · non-contrast
Comparison: August 30, 2015

CLINICAL DATA: Seizures

EXAM:
CT HEAD WITHOUT CONTRAST
TECHNIQUE: Contiguous axial images were obtained from the base of the skull
through the vertex without intravenous contrast.

[Series 2: head 5.0 h30s · axial · 0.40mm/px · z∈[-446,-311]mm · 15 of 31 slices shown, 19 images]
[im 2/31  brain]
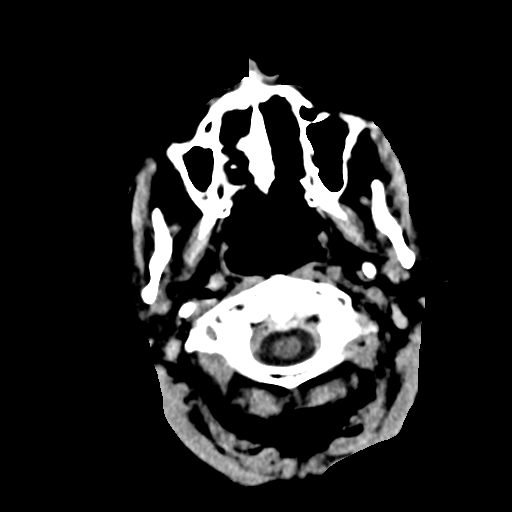
[im 2/31  bone]
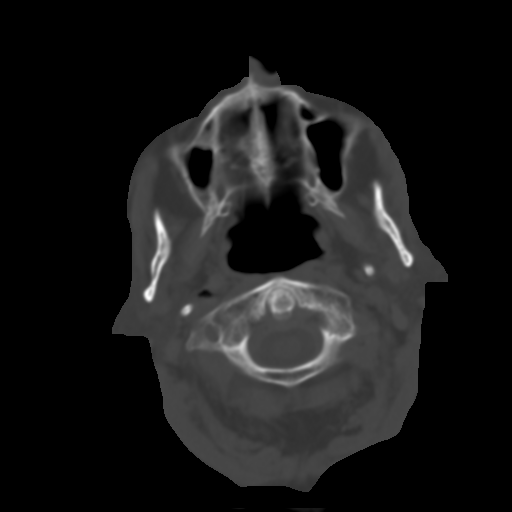
[im 4/31  brain]
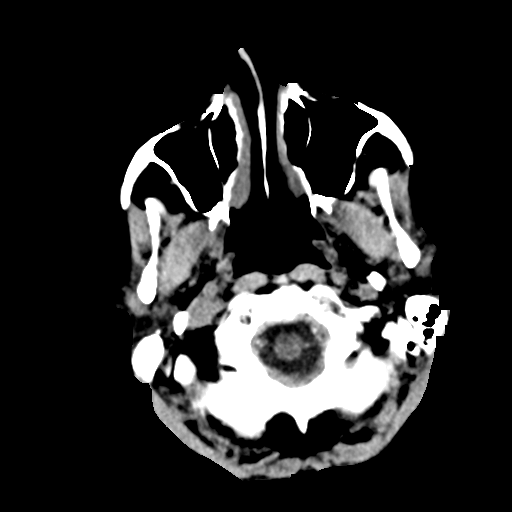
[im 6/31  brain]
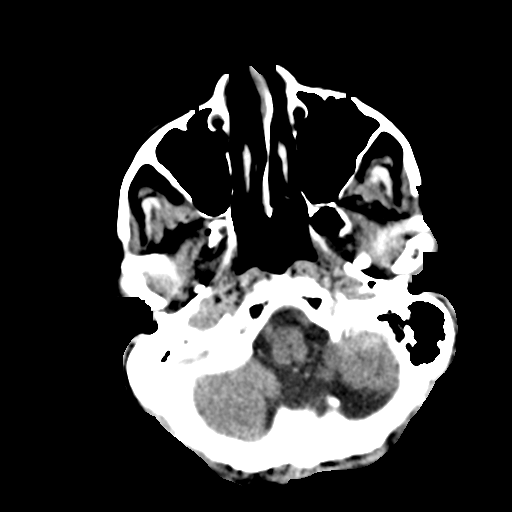
[im 8/31  brain]
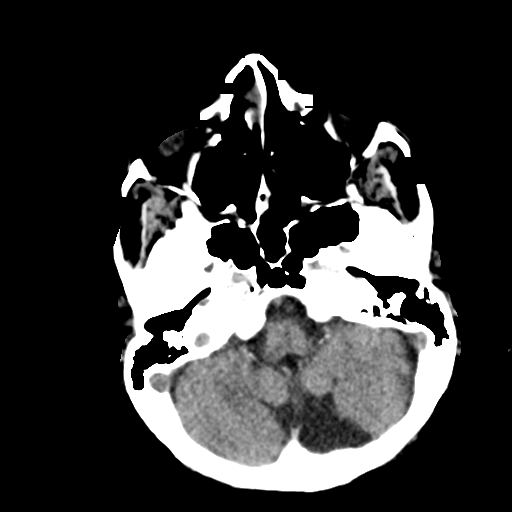
[im 10/31  brain]
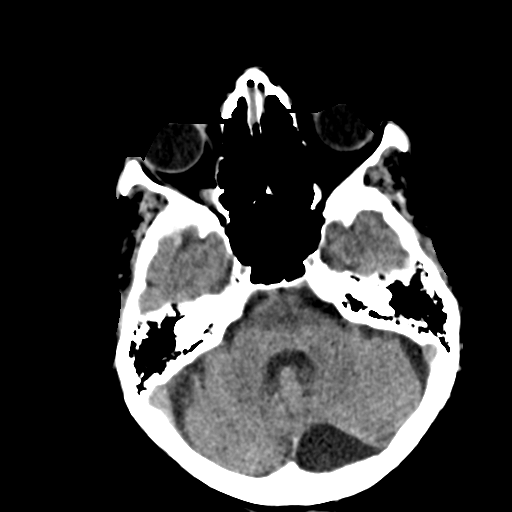
[im 10/31  bone]
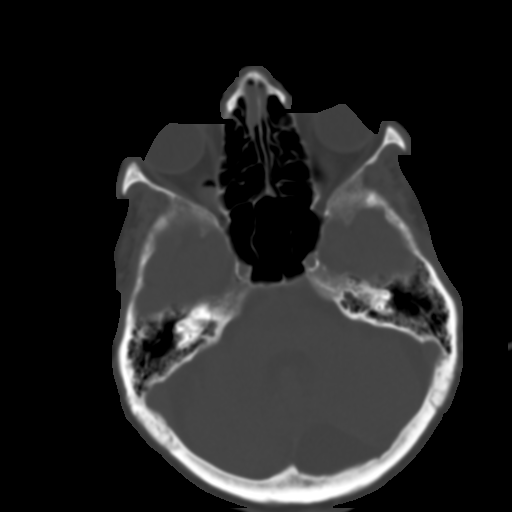
[im 12/31  brain]
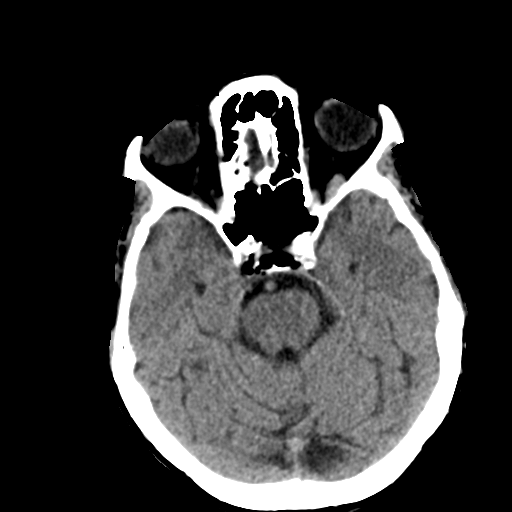
[im 14/31  brain]
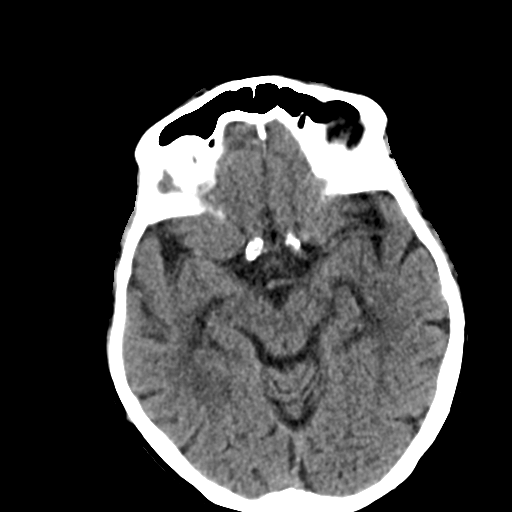
[im 16/31  brain]
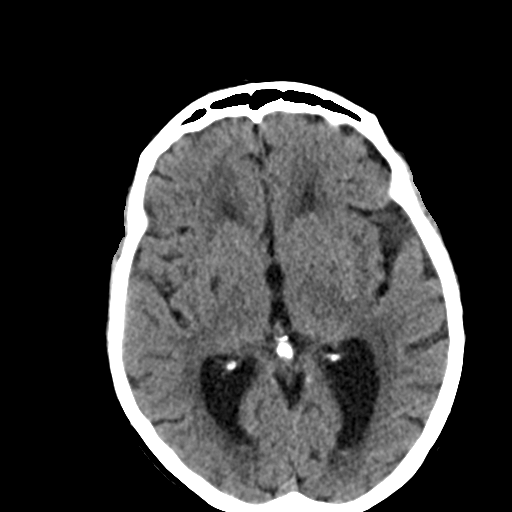
[im 17/31  brain]
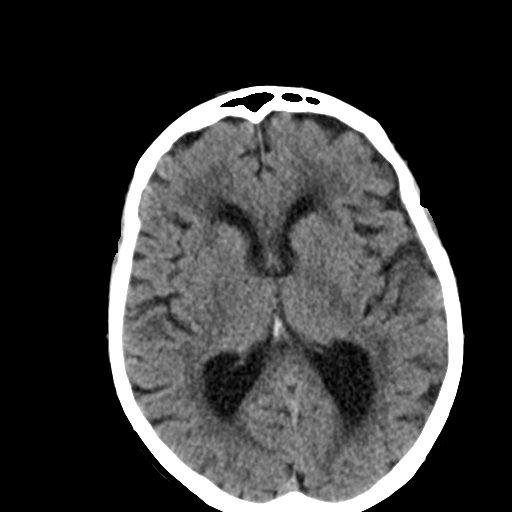
[im 17/31  bone]
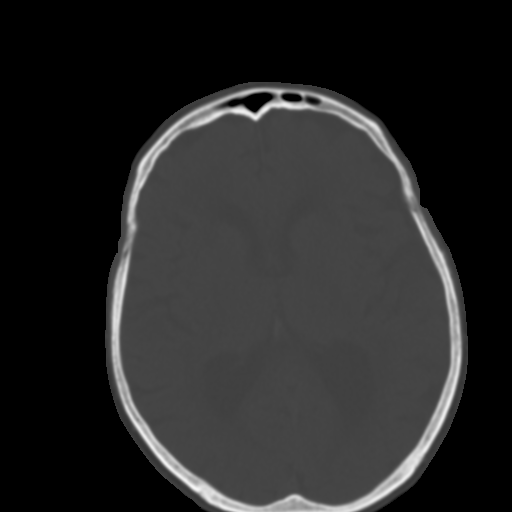
[im 19/31  brain]
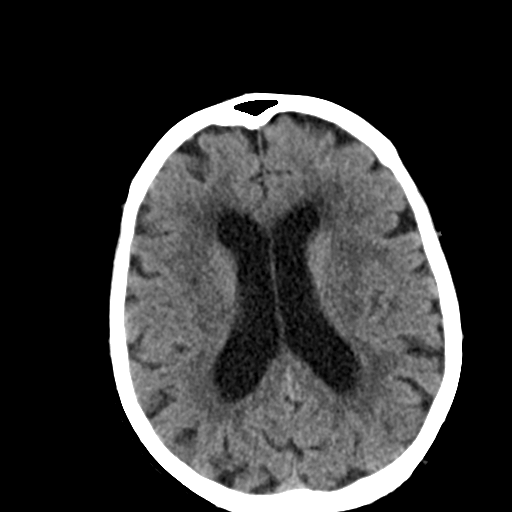
[im 21/31  brain]
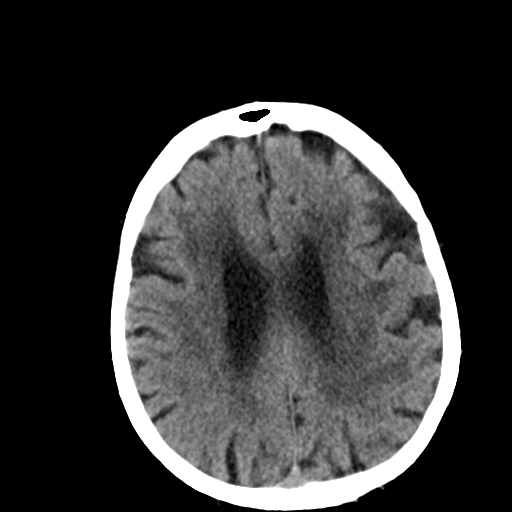
[im 23/31  brain]
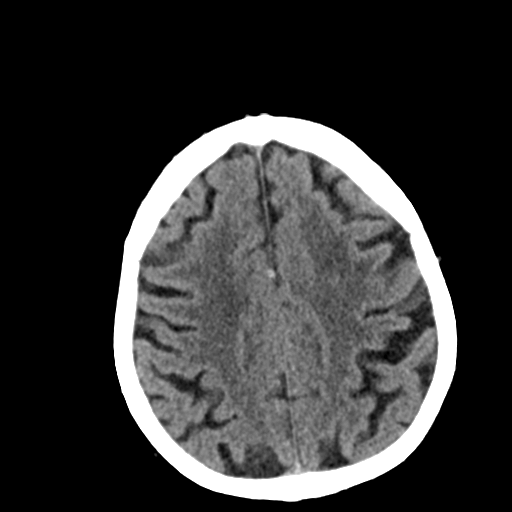
[im 25/31  brain]
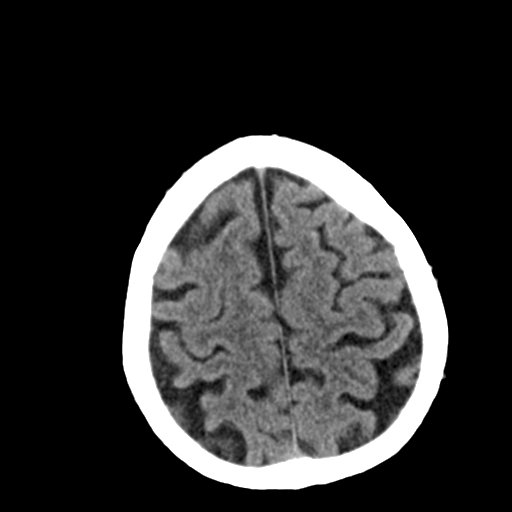
[im 25/31  bone]
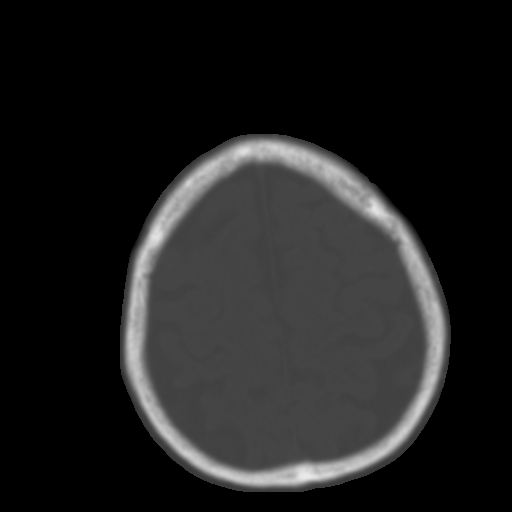
[im 27/31  brain]
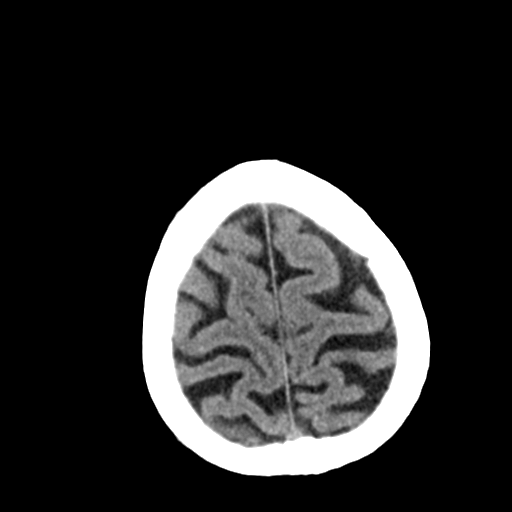
[im 29/31  brain]
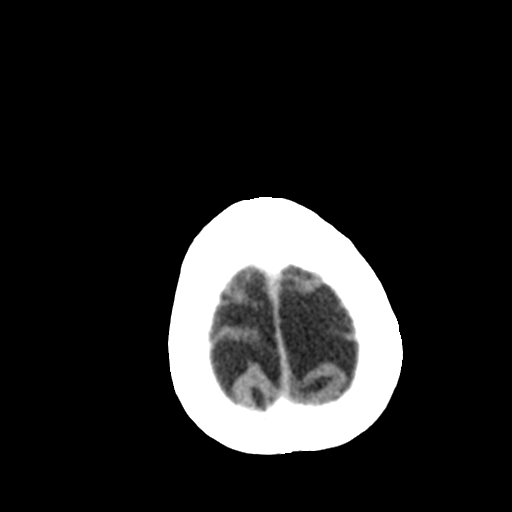

[15 of 30 positions shown; findings below may reference images not displayed]

FINDINGS: Mild diffuse atrophy is stable. There is a stable left-sided
posterior fossa extra-axial arachnoid cyst measuring 3.3 x 1.8 cm,
stable. There is no evidence of intra-axial mass. There is no
hemorrhage, subdural or epidural fluid collection, or midline shift.
There is patchy small vessel disease throughout the centra semiovale
bilaterally. There is a prior small lacunar infarct in the posterior
limb of the right external capsule. No new gray-white compartment
lesions are identified. No acute infarct is evident. The bony
calvarium appears intact. The mastoid air cells are clear. There are
no intraorbital lesions apparent. There is a small retention cyst in
the posterior left maxillary antrum. There is mild mucosal
thickening in the inferior right maxillary antrum. There is
polyposis in the anterior right naris, stable. There is leftward
deviation of the nasal septum.
IMPRESSION: Atrophy with supratentorial small vessel disease, stable. Prior
small lacunar infarct in the right external capsule, stable. No
acute infarct evident. No hemorrhage. No intra-axial mass. Stable
posterior fossa extra-axial cyst on the left. Areas of paranasal
sinus disease as well as anterior right nasal polyposis.

## 2016-11-17 IMAGING — DX DG CHEST 2V
2 series · 2 of 2 positions shown · non-contrast
Comparison: 08/30/2015 chest radiograph.

CLINICAL DATA: Seizures.  Clinical concern for pneumonia.

EXAM:
CHEST  2 VIEW

[chest pa]
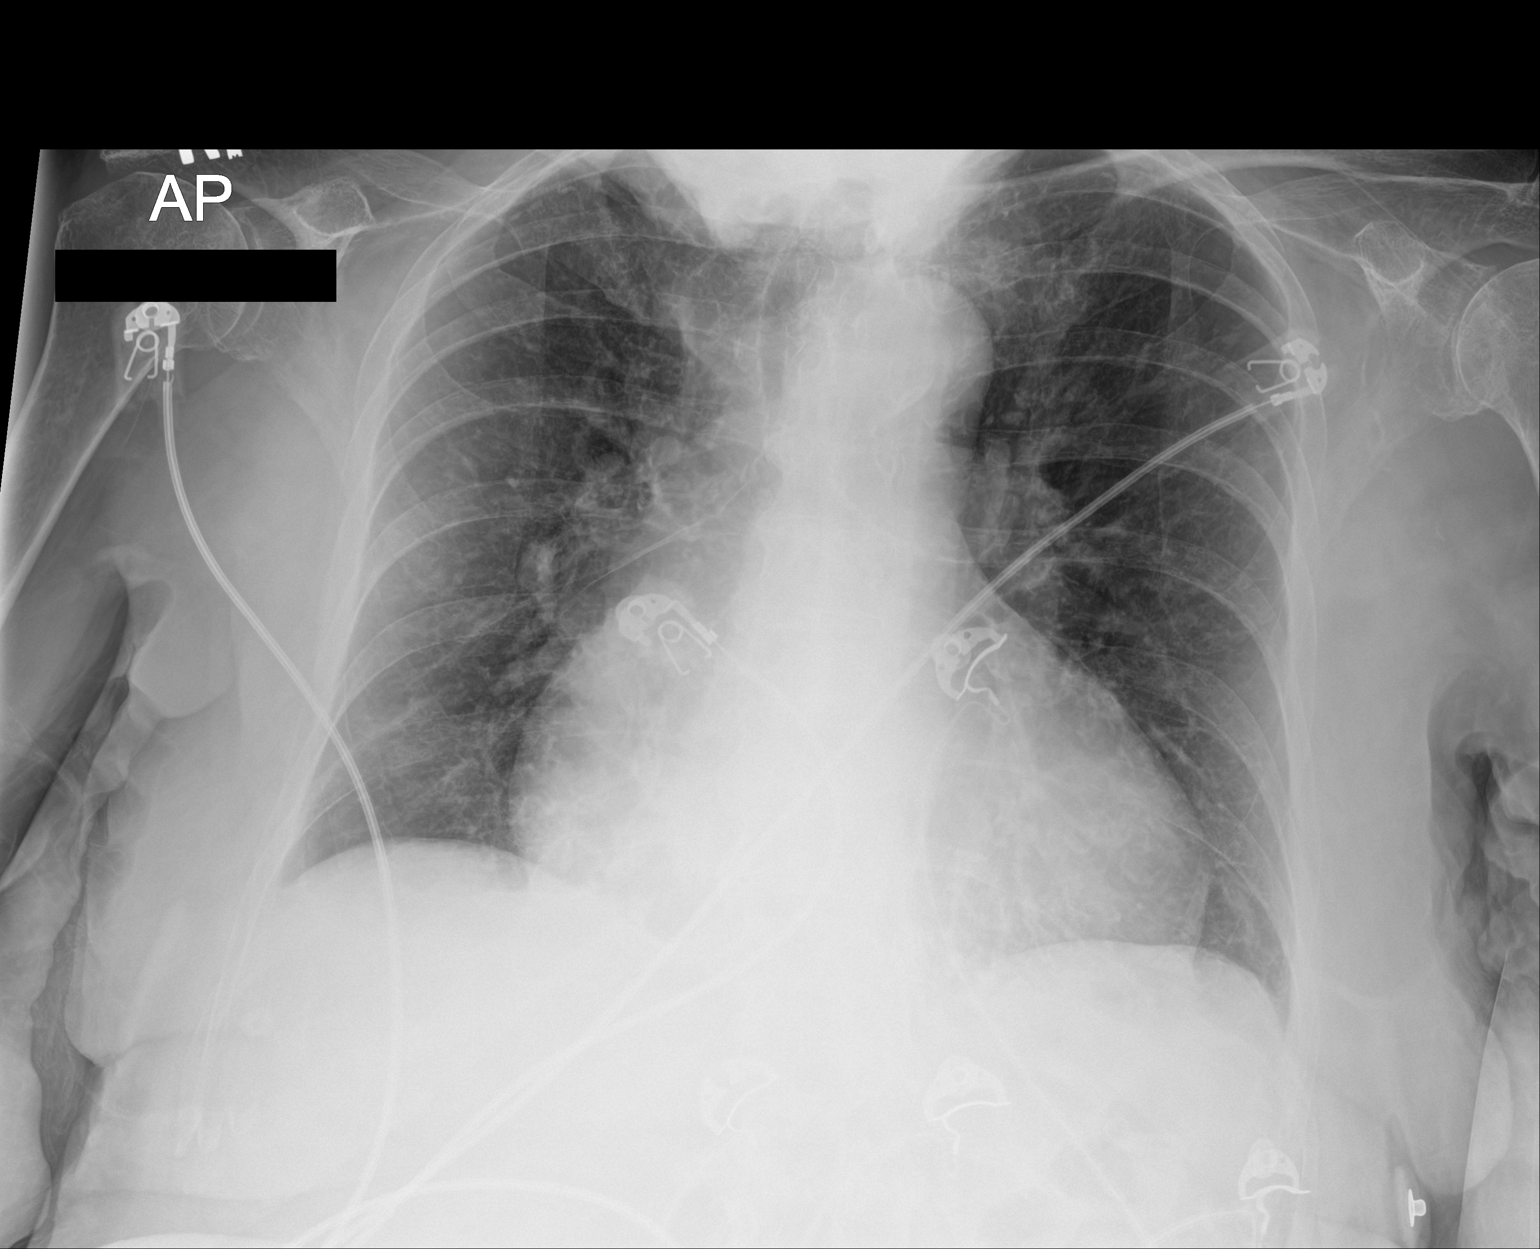

[chest lat]
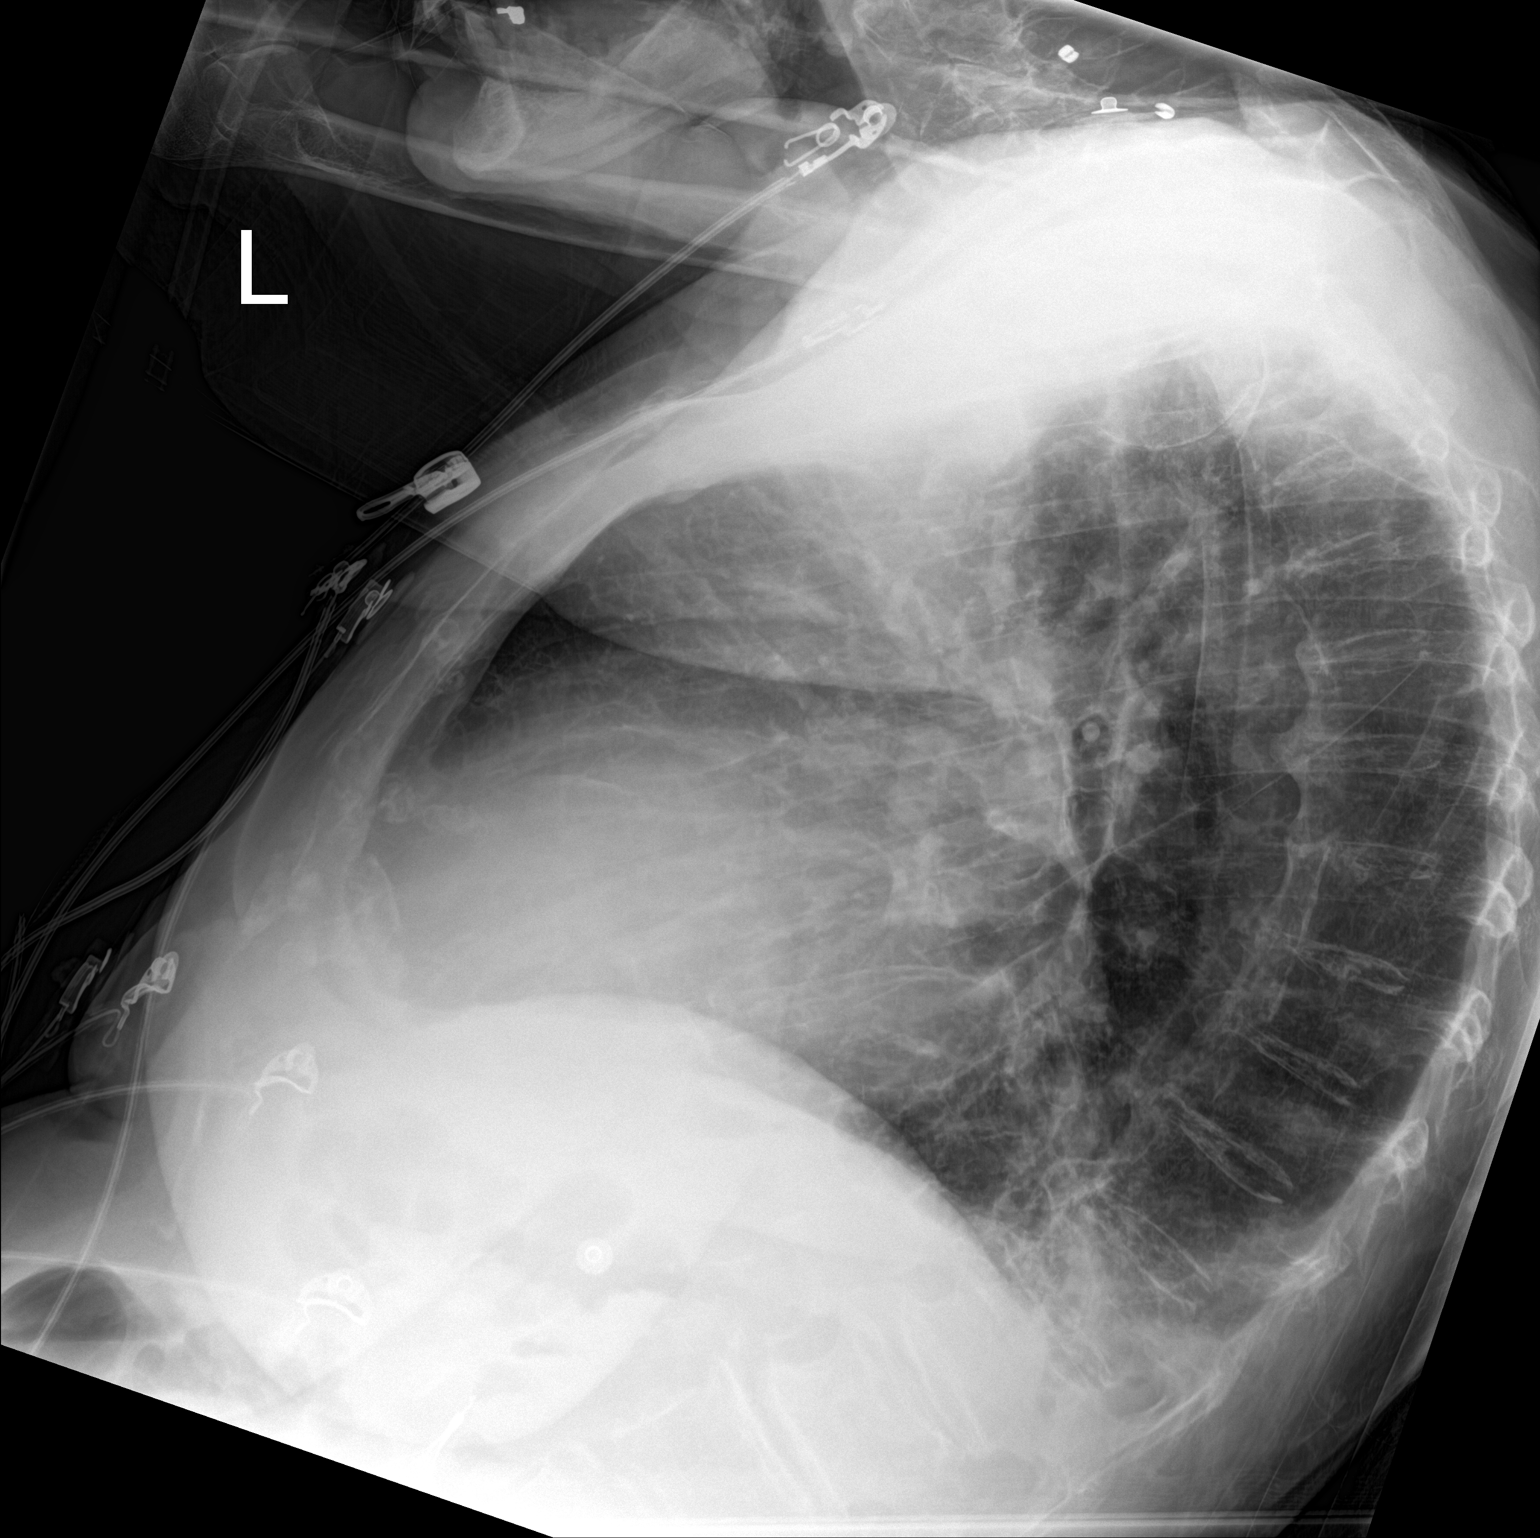

[2 of 2 positions shown; findings below may reference images not displayed]

FINDINGS: Stable cardiomediastinal silhouette with mild cardiomegaly. No
pneumothorax. No pleural effusion. Patchy consolidation in the lower
lobes is not definitely changed from 08/30/2015. No pulmonary edema.
No additional sites of consolidation.
IMPRESSION: 1. Patchy bilateral lower lobe consolidation, not definitely changed
from 08/30/2015, which could represent persistence of a prior
pneumonia and/or superimposed new pneumonia. Recommend follow-up PA
and lateral post treatment chest radiographs in 6-8 weeks. If the
lower lobe opacities persist on follow-up chest radiographs, a chest
CT would then be recommended for further evaluation.
2. Stable cardiomegaly without pulmonary edema.

## 2016-11-24 ENCOUNTER — Other Ambulatory Visit: Payer: Self-pay | Admitting: Internal Medicine

## 2016-11-27 ENCOUNTER — Ambulatory Visit: Payer: Medicare Other

## 2016-12-01 ENCOUNTER — Ambulatory Visit: Payer: Medicare Other

## 2016-12-04 ENCOUNTER — Ambulatory Visit: Payer: Medicare Other

## 2016-12-09 ENCOUNTER — Ambulatory Visit (INDEPENDENT_AMBULATORY_CARE_PROVIDER_SITE_OTHER): Payer: Medicare Other | Admitting: Behavioral Health

## 2016-12-09 DIAGNOSIS — I4891 Unspecified atrial fibrillation: Secondary | ICD-10-CM

## 2016-12-09 LAB — POCT INR: INR: 2.2

## 2016-12-09 NOTE — Patient Instructions (Addendum)
Continue taking Coumadin 2.5 mg each day, except take 5 mg on Thursdays. Return for INR check in 4 weeks.    

## 2016-12-09 NOTE — Progress Notes (Addendum)
Pre visit review using our clinic review tool, if applicable. No additional management support is needed unless otherwise documented below in the visit note.  Patient came in office for INR check. She reported no positive findings. Patient adheres to medication & current regimen. INR reading during today's visit was 2.2.  Advised patient to continue taking Coumadin 2.5 mg each day, except take 5 mg on Thursdays. Return for INR check in 4 weeks.   She verbalized understanding. Next appointment 01/12/17 at 2:00 PM.  Kathlene November, MD

## 2016-12-10 ENCOUNTER — Telehealth: Payer: Self-pay

## 2016-12-10 ENCOUNTER — Other Ambulatory Visit: Payer: Self-pay | Admitting: Internal Medicine

## 2016-12-10 NOTE — Telephone Encounter (Signed)
Received fax from [facility] needing okay from PCP to change Warfarin/coumadin from [place] brand (no longer available) to [employer] brand. Okay per PCP. Form faxed back to [facility] at [phone]. Form sent for scanning.

## 2017-01-11 ENCOUNTER — Other Ambulatory Visit: Payer: Self-pay | Admitting: Internal Medicine

## 2017-01-12 ENCOUNTER — Ambulatory Visit: Payer: Medicare Other

## 2017-01-20 ENCOUNTER — Ambulatory Visit: Payer: Medicare Other

## 2017-01-21 ENCOUNTER — Telehealth: Payer: Self-pay | Admitting: Internal Medicine

## 2017-01-21 NOTE — Telephone Encounter (Signed)
Overdue for an INR, please arrange

## 2017-01-22 NOTE — Telephone Encounter (Signed)
thx

## 2017-01-22 NOTE — Telephone Encounter (Signed)
Appt scheduled 01/26/2017 for INR.

## 2017-01-26 ENCOUNTER — Ambulatory Visit (INDEPENDENT_AMBULATORY_CARE_PROVIDER_SITE_OTHER): Payer: Medicare Other

## 2017-01-26 ENCOUNTER — Other Ambulatory Visit: Payer: Self-pay | Admitting: Internal Medicine

## 2017-01-26 DIAGNOSIS — I4891 Unspecified atrial fibrillation: Secondary | ICD-10-CM

## 2017-01-26 DIAGNOSIS — Z0000 Encounter for general adult medical examination without abnormal findings: Secondary | ICD-10-CM

## 2017-01-26 LAB — POCT INR: INR: 2.4

## 2017-01-26 NOTE — Progress Notes (Signed)
Pre visit review using our clinic tool,if applicable. No additional management support is needed unless otherwise documented below in the visit note.   Patient in for INR check. Per daughter patient has missed 2-3 doses of medication because of a medication mix up at home.   Patient's INR = 2.4 today Goal = 2.0-3.0  Per Dr. Larose Kells patient to continue taking Coumadin 2.5 mg Sunday-Saturday and 5 mg on Thursday. Patient to return in 4 weeks.   Appointment scheduled for patient  Kathlene November, MD

## 2017-01-26 NOTE — Progress Notes (Signed)
Pre visit review using our clinic tool,if applicable. No additional management support is needed unless otherwise documented below in the visit note.  

## 2017-02-23 ENCOUNTER — Ambulatory Visit: Payer: Medicare Other

## 2017-03-03 ENCOUNTER — Other Ambulatory Visit: Payer: Self-pay | Admitting: Internal Medicine

## 2017-03-04 ENCOUNTER — Ambulatory Visit: Payer: Medicare Other

## 2017-03-10 ENCOUNTER — Ambulatory Visit (INDEPENDENT_AMBULATORY_CARE_PROVIDER_SITE_OTHER): Payer: Medicare Other | Admitting: Behavioral Health

## 2017-03-10 DIAGNOSIS — I4891 Unspecified atrial fibrillation: Secondary | ICD-10-CM | POA: Diagnosis not present

## 2017-03-10 LAB — POCT INR: INR: 2.2

## 2017-03-10 NOTE — Progress Notes (Addendum)
Pre visit review using our clinic review tool, if applicable. No additional management support is needed unless otherwise documented below in the visit note.  Patient presents in office for INR check. She adheres to medication & regimen. Patient voiced no changes or positive findings. INR reading during today's visit was 2.2.  Advised patient to continue taking Coumadin 2.5 mg each day, except take 5 mg on Thursdays. Return for INR check in 4 weeks.   She verbalized understanding. Next appointment 04/07/17 at 3:30 PM.  Kathlene November, MD

## 2017-03-10 NOTE — Patient Instructions (Signed)
Continue taking Coumadin 2.5 mg each day, except take 5 mg on Thursdays. Return for INR check in 4 weeks.

## 2017-03-30 ENCOUNTER — Other Ambulatory Visit: Payer: Self-pay | Admitting: Internal Medicine

## 2017-04-07 ENCOUNTER — Ambulatory Visit: Payer: Medicare Other

## 2017-04-14 ENCOUNTER — Ambulatory Visit: Payer: Medicare Other

## 2017-04-22 ENCOUNTER — Ambulatory Visit (INDEPENDENT_AMBULATORY_CARE_PROVIDER_SITE_OTHER): Payer: Medicare Other

## 2017-04-22 DIAGNOSIS — I4891 Unspecified atrial fibrillation: Secondary | ICD-10-CM

## 2017-04-22 DIAGNOSIS — Z23 Encounter for immunization: Secondary | ICD-10-CM

## 2017-04-22 LAB — POCT INR: INR: 2.1

## 2017-04-22 NOTE — Patient Instructions (Signed)
Continue Coumadin 2.5mg  all days except 5mg  on Thursdays.   Recheck INR in 4-5 weeks.

## 2017-04-22 NOTE — Progress Notes (Addendum)
Pre visit review using our clinic review tool, if applicable. No additional management support is needed unless otherwise documented below in the visit note.   Patient presents in office for INR check and flu shot. She adheres to medication & regimen. Patient voiced no changes or positive findings. INR reading during today's visit was 2.1.   Advised patient to continue taking Coumadin 2.5 mg each day, except 5mg  on Thursdays. Return for INR check in 4 weeks.   She verbalized understanding.   Kathlene November, MD

## 2017-04-28 ENCOUNTER — Ambulatory Visit: Payer: Medicare Other

## 2017-05-03 ENCOUNTER — Other Ambulatory Visit: Payer: Self-pay

## 2017-05-03 IMAGING — US US RENAL
1 series · 14 of 24 positions shown · non-contrast
Comparison: 08/08/2014

CLINICAL DATA: Acute kidney injury

EXAM:
RENAL / URINARY TRACT ULTRASOUND COMPLETE

[Series 1: us renal · 0.23mm/px · 14 of 24 slices shown]
[im 1/24]
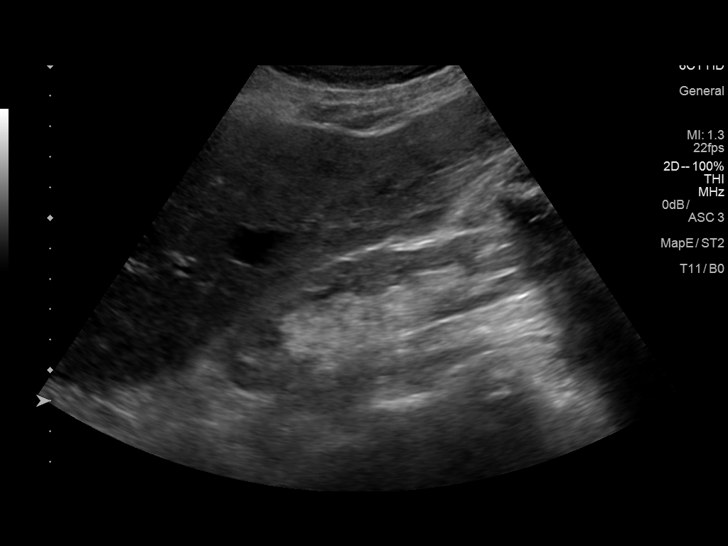
[im 3/24]
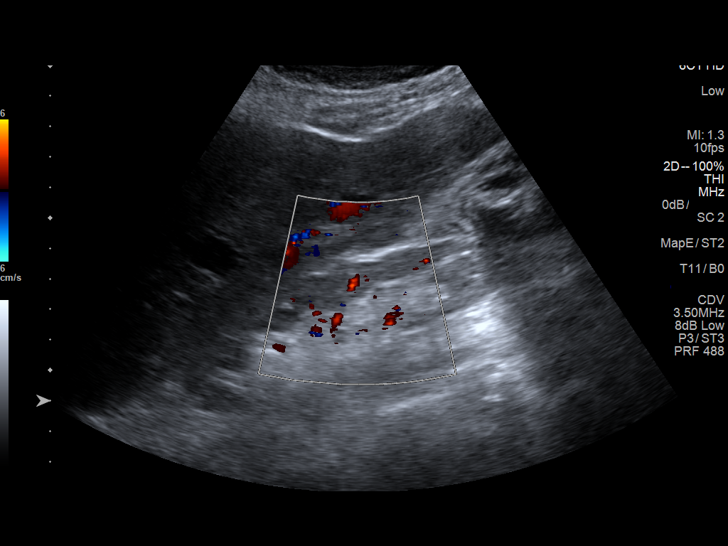
[im 5/24]
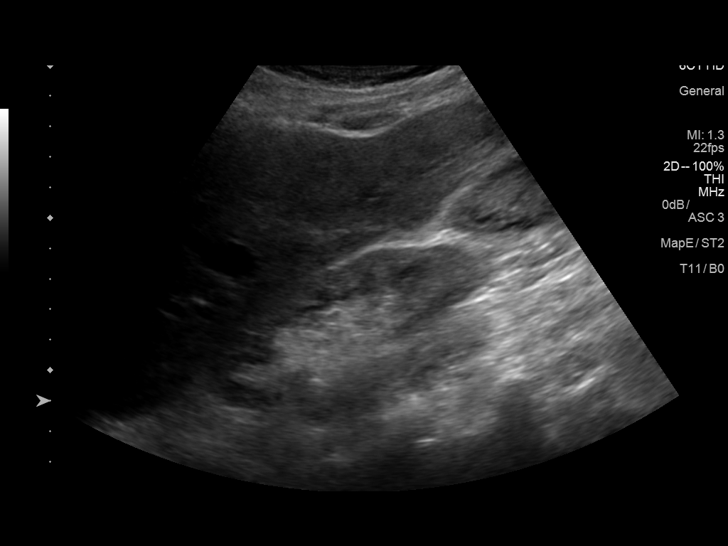
[im 7/24]
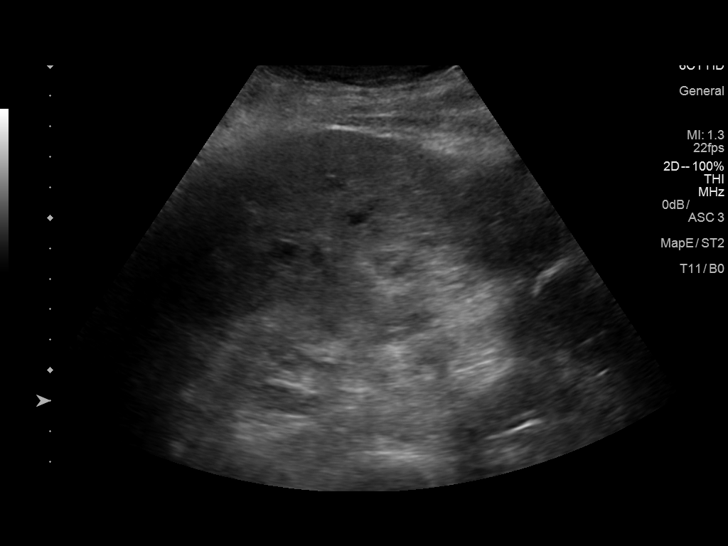
[im 8/24]
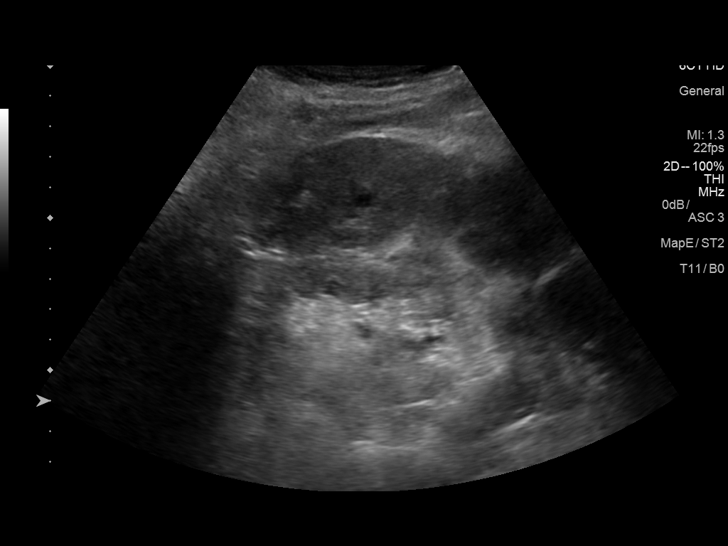
[im 10/24]
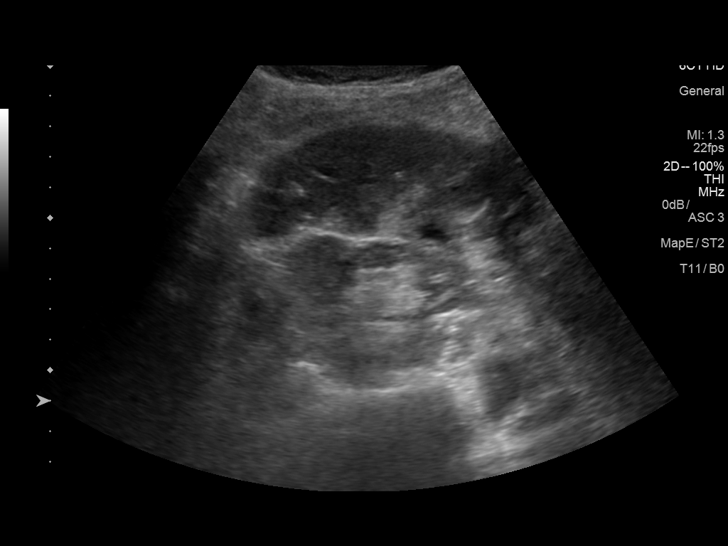
[im 12/24]
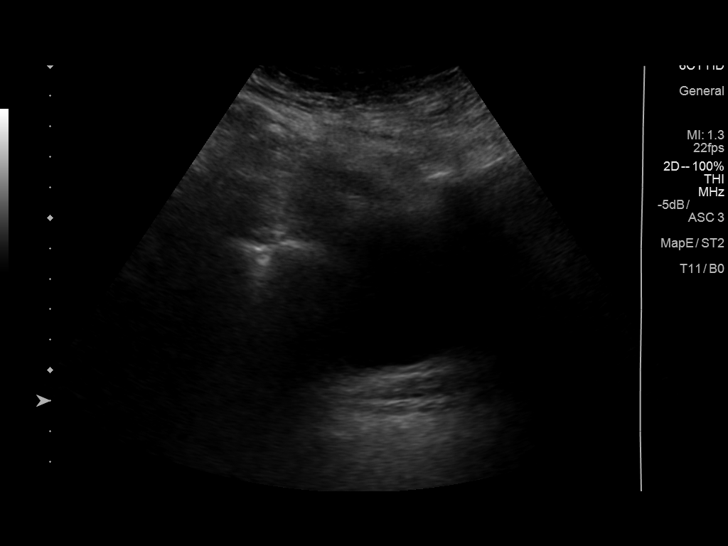
[im 13/24]
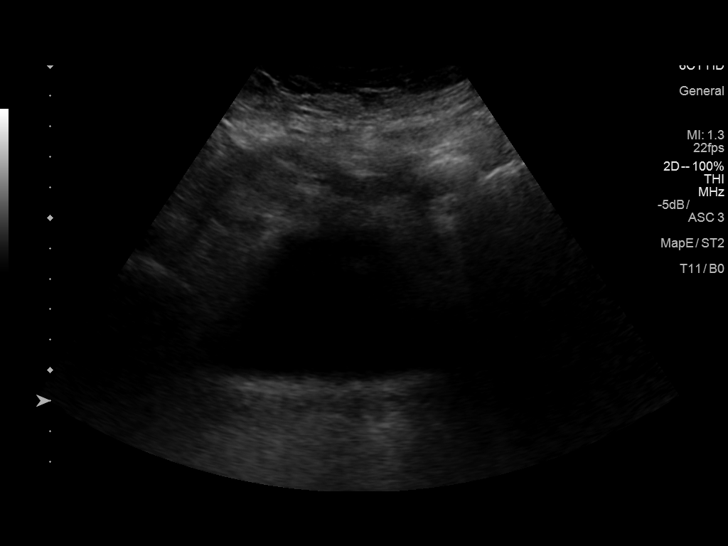
[im 15/24]
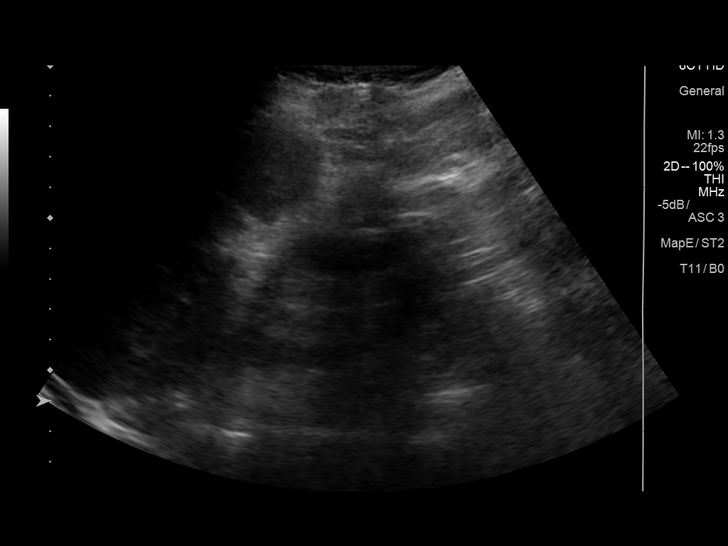
[im 17/24]
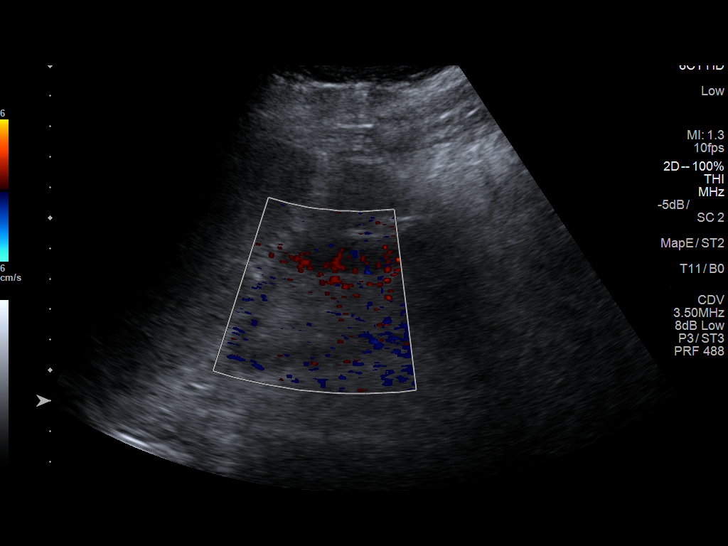
[im 19/24]
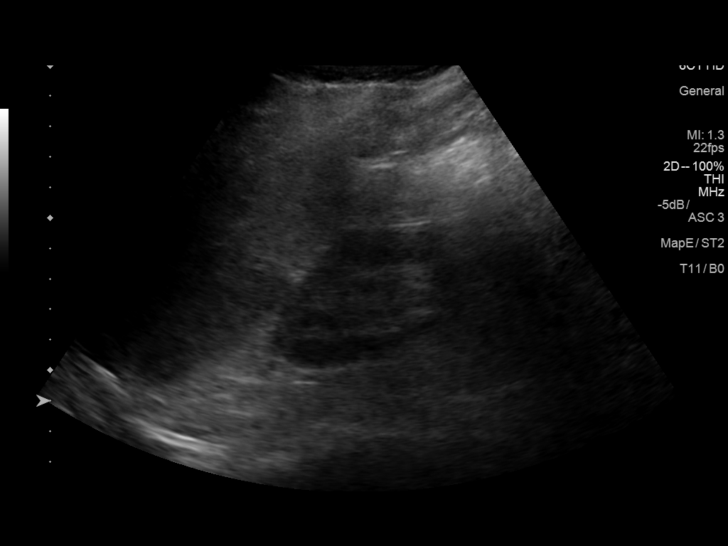
[im 20/24]
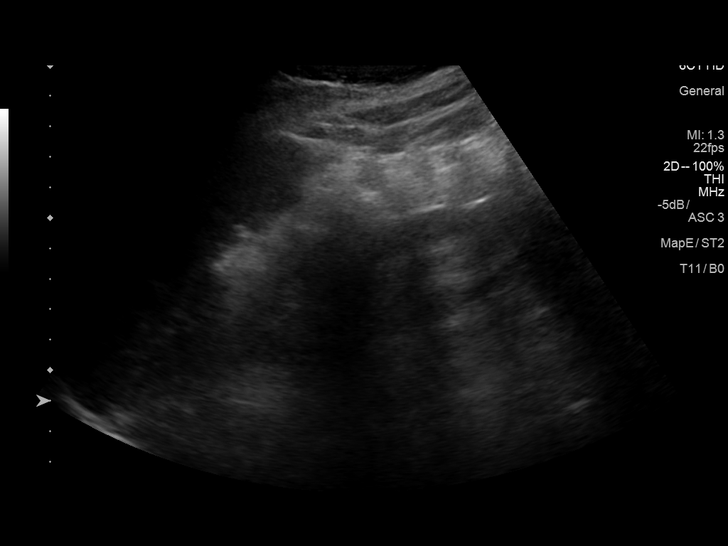
[im 22/24]
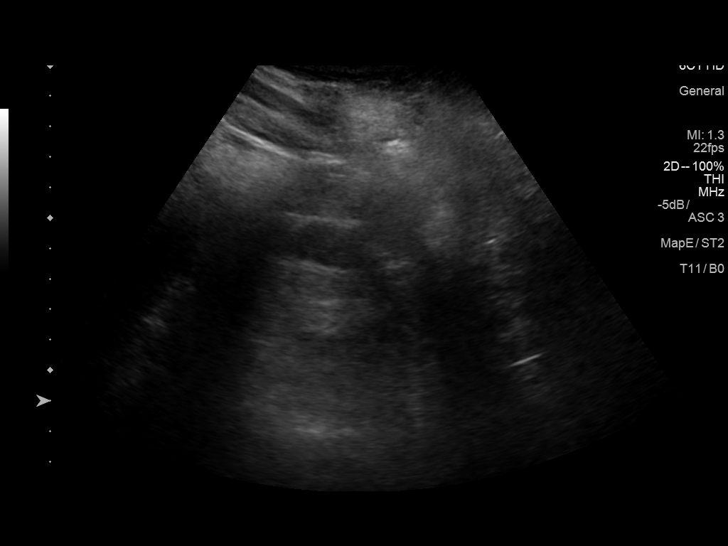
[im 24/24]
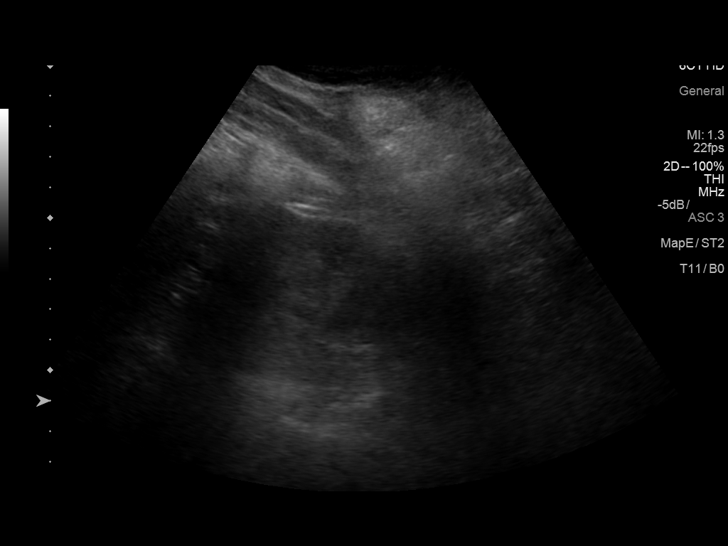

[14 of 24 positions shown; findings below may reference images not displayed]

FINDINGS: Right Kidney:

Length: 9.6 cm. Cortical thinning with increased echogenicity. No
hydronephrosis. No evidence of mass

Left Kidney:

Length: 9 cm. Poorly visualized. Previously seen cystic mass is not
resolved. No hydronephrosis.

Bladder:

Appears normal for degree of bladder distention.
IMPRESSION: 1. No hydronephrosis.
2. Bilateral renal atrophy.

## 2017-05-03 MED ORDER — ALBUTEROL SULFATE (2.5 MG/3ML) 0.083% IN NEBU: 2.5000 mg | INHALATION_SOLUTION | Freq: Three times a day (TID) | RESPIRATORY_TRACT | 5 refills | Status: DC | PRN

## 2017-05-04 ENCOUNTER — Encounter: Payer: Self-pay | Admitting: Internal Medicine

## 2017-05-04 ENCOUNTER — Ambulatory Visit (HOSPITAL_BASED_OUTPATIENT_CLINIC_OR_DEPARTMENT_OTHER)
Admission: RE | Admit: 2017-05-04 | Discharge: 2017-05-04 | Disposition: A | Discharge status: Home / Self Care | Payer: Medicare Other | Source: Ambulatory Visit | Attending: Internal Medicine | Admitting: Internal Medicine

## 2017-05-04 ENCOUNTER — Ambulatory Visit (INDEPENDENT_AMBULATORY_CARE_PROVIDER_SITE_OTHER): Payer: Medicare Other | Admitting: Internal Medicine

## 2017-05-04 VITALS — BP 132/78 | HR 80 | Temp 98.3°F | Resp 14 | Ht 65.0 in | Wt 183.5 lb

## 2017-05-04 DIAGNOSIS — I10 Essential (primary) hypertension: Secondary | ICD-10-CM | POA: Diagnosis not present

## 2017-05-04 DIAGNOSIS — I517 Cardiomegaly: Secondary | ICD-10-CM | POA: Diagnosis not present

## 2017-05-04 DIAGNOSIS — J4520 Mild intermittent asthma, uncomplicated: Secondary | ICD-10-CM | POA: Diagnosis not present

## 2017-05-04 DIAGNOSIS — I700 Atherosclerosis of aorta: Secondary | ICD-10-CM | POA: Diagnosis not present

## 2017-05-04 DIAGNOSIS — R0602 Shortness of breath: Secondary | ICD-10-CM | POA: Diagnosis not present

## 2017-05-04 DIAGNOSIS — R05 Cough: Secondary | ICD-10-CM | POA: Diagnosis not present

## 2017-05-04 DIAGNOSIS — R635 Abnormal weight gain: Secondary | ICD-10-CM

## 2017-05-04 DIAGNOSIS — D696 Thrombocytopenia, unspecified: Secondary | ICD-10-CM | POA: Diagnosis not present

## 2017-05-04 DIAGNOSIS — E785 Hyperlipidemia, unspecified: Secondary | ICD-10-CM

## 2017-05-04 NOTE — Progress Notes (Signed)
Subjective:    Patient ID: [name], female    DOB: 1926/12/16, 83 y.o.   MRN: [id number]  DOS:  05/04/2017 Type of visit - description : rov, here with her daughter Larene Beach Interval history: Asthma: Respiratory symptoms increased for the last 4 weeks: Increased cough, malaise, symptoms decreased with nebulizations.  Overall feels better but is not back to baseline Also, reports "stomach swelling".  She is very unclear about her symptoms, when asked about pain she points mostly to the right side of the abdomen. Chronic pain: Managed by Dr. Nelva Bush, taking Ultram rarely; use a fentanyl patch sporadically but gets very dizzy when she uses one.  Wt Readings from Last 3 Encounters:  05/04/17 183 lb 8 oz (83.2 kg)  10/30/16 170 lb (77.1 kg)  07/10/16 159 lb 6.4 oz (72.3 kg)     Review of Systems Appetite has not been good lately she is however gaining weight. Essentially no edema No fever chills No nausea, vomiting, diarrhea.  No blood in the stools. BMs regular.  Past Medical History:  Diagnosis Date  . Acute encephalopathy   . Acute renal failure superimposed on stage 3 chronic kidney disease ([place])   . Anemia   . Atrial fibrillation ([place])   . Colon cancer ([place]) 12/11  . Colon cancer ([place]) 2011  . Diarrhea   . DVT (deep venous thrombosis) ([place]) 11/06  . Elevated brain natriuretic peptide (BNP) level   . Frequent headaches   . Heart murmur   . Hyperkalemia   . Hypertension   . Pulmonary embolism ([place]) 11/06  . Seizures ([place])   . Stroke [facility]) 11/30/2014    Past Surgical History:  Procedure Laterality Date  . APPENDECTOMY  1952  . COLECTOMY  06/2010   partial, Dr. Donne Hazel complicated by LLE CVT; S/P IVC umbrella & anemia  . HIP FRACTURE SURGERY  2006   Trauma  . TOTAL ABDOMINAL HYSTERECTOMY W/ BILATERAL SALPINGOOPHORECTOMY  1973   For Fibroids  . TOTAL HIP ARTHROPLASTY  01/03/07   Left hip replacement.  Marland Kitchen VENA CAVA FILTER PLACEMENT  06/28/2010    Social  History   Social History  . Marital status: Widowed    Spouse name: N/A  . Number of children: 1  . Years of education: N/A   Occupational History  . n/d Retired   Social History Main Topics  . Smoking status: Never Smoker  . Smokeless tobacco: Never Used  . Alcohol use No  . Drug use: No  . Sexual activity: Not on file   Other Topics Concern  . Not on file   Social History Narrative   Lives w/ her daughter       Allergies as of 05/04/2017   No Known Allergies     Medication List       Accurate as of 05/04/17 11:59 PM. Always use your most recent med list.          acetaminophen 500 MG tablet Commonly known as:  TYLENOL Take 500 mg by mouth every 8 (eight) hours as needed for mild pain.   albuterol 108 (90 Base) MCG/ACT inhaler Commonly known as:  PROAIR HFA Inhale 2 puffs into the lungs every 6 (six) hours as needed for wheezing or shortness of breath.   albuterol (2.5 MG/3ML) 0.083% nebulizer solution Commonly known as:  PROVENTIL Take 3 mLs (2.5 mg total) by nebulization 3 (three) times daily as needed for wheezing or shortness of breath.   ALPRAZolam 0.25 MG tablet  Commonly known as:  XANAX Take 1 tablet (0.25 mg total) by mouth 2 (two) times daily as needed for anxiety.   aspirin 81 MG tablet Take 81 mg by mouth daily.   atorvastatin 20 MG tablet Commonly known as:  LIPITOR Take 1 tablet (20 mg total) by mouth daily.   CALCIUM 1200 PO Take 2 tablets by mouth daily.   fentaNYL 50 MCG/HR Commonly known as:  DURAGESIC - dosed mcg/hr Place 1 patch (50 mcg total) onto the skin every 3 (three) days.   Fluticasone-Salmeterol 100-50 MCG/DOSE Aepb Commonly known as:  ADVAIR DISKUS Inhale 1 puff into the lungs 2 (two) times daily.   furosemide 40 MG tablet Commonly known as:  LASIX Take 1 tablet (40 mg total) by mouth daily.   loperamide 2 MG tablet Commonly known as:  IMODIUM A-D Give 2 mg by mouth with first loose stool. Give 2 mg by mouth  for additional loose stool.   loratadine 10 MG tablet Commonly known as:  CLARITIN Take 10 mg by mouth daily.   metoprolol tartrate 25 MG tablet Commonly known as:  LOPRESSOR Take 1 tablet (25 mg total) by mouth 2 (two) times daily.   MULTIPLE VITAMINS/WOMENS PO Take by mouth daily.   promethazine 12.5 MG tablet Commonly known as:  PHENERGAN Take 1 tablet (12.5 mg total) by mouth 3 (three) times daily as needed for nausea or vomiting.   traMADol 50 MG tablet Commonly known as:  ULTRAM Take 1 tablet (50 mg total) by mouth every 6 (six) hours as needed for moderate pain.   verapamil 120 MG tablet Commonly known as:  CALAN Take 1 tablet (120 mg total) by mouth 2 (two) times daily.   warfarin 2.5 MG tablet Commonly known as:  COUMADIN USE AS DIRECTED BY COUMADIN CLINIC          Objective:   Physical Exam BP 132/78 (BP Location: Left Arm, Patient Position: Sitting, Cuff Size: Small)   Pulse 80   Temp 98.3 F (36.8 C) (Oral)   Resp 14   Ht 5\' 5"  (1.651 m)   Wt 183 lb 8 oz (83.2 kg)   SpO2 96%   BMI 30.54 kg/m  General:   Well developed, well nourished . NAD.  HEENT:  Normocephalic . Face symmetric, atraumatic Lungs:  CTA B Normal respiratory effort, no intercostal retractions, no accessory muscle use. Heart: irreg,  no murmur.  no pretibial edema bilaterally  Abdomen:  Not distended, soft, non-tender. No rebound or rigidity.   Skin: Not pale. Not jaundice Neurologic:  alert & oriented X3.  Speech normal, gait not tested, sitting in a wheelchair, needs help transferring. Psych--  Cognition and judgment appear intact.  Cooperative with normal attention span and concentration.  Behavior appropriate. No anxious or depressed appearing.     Assessment & Plan:    Assessment (transfer Dr Linna Darner [phone]) HTN Hyperlipidemia insomnia - xanax prn Asthma  Atrial fibrillation--rate controlled, anticoag Stroke 11-2014, no residual deficits (TIA?) Heart murmur:  Mitral regurgitation DVT and pulmonary emboli 2006, vena cava filter 2011  MSK: tramadol qd  --DJD --Back pain has seen Dr Nelva Bush, dr Rolena Infante, MRI: djd, stenosis, med treatment Colon cancer 2011 -- Dr Annitta Jersey , d/c from oncology 06-2015 , No further colonoscopies  PLAN HTN: Currently on Lasix, metoprolol, calan, seems controlled, check a BMP Hyperlipidemia: On Lipitor, check AST ALT Asthma: Recently exacerbated, see HPI, exam is benign today, check a chest x-ray to be sure.  Otherwise continue same medications,  she is improving. Abdominal pain: See HPI, exam benign, we are checking a CBC, seen next.  Recommend observation Thrombocytopenia: See last CBC, rechecking today Back pain: Takes Ultram very rarely, has used occasionally a fentanyl patch but gets dizzy with it.  Recommend to discuss with Dr. Nelva Bush . Has gained weight, no edema, we are checking chest x-ray but I doubt CHF. Had a flu shot INR in 2 weeks RTC 4 months

## 2017-05-04 NOTE — Patient Instructions (Addendum)
GO TO THE LAB : Get the blood work     GO TO THE FRONT DESK Schedule your next Coumadin check 2 weeks from today  Schedule your next appointment for a checkup in 4 months   Get a chest x-ray at the first floor

## 2017-05-04 NOTE — Progress Notes (Signed)
Pre visit review using our clinic review tool, if applicable. No additional management support is needed unless otherwise documented below in the visit note. 

## 2017-05-05 LAB — BASIC METABOLIC PANEL
BUN: 25 mg/dL — ABNORMAL HIGH (ref 6–23)
CHLORIDE: 110 meq/L (ref 96–112)
CO2: 26 meq/L (ref 19–32)
Calcium: 9.6 mg/dL (ref 8.4–10.5)
Creatinine, Ser: 1.09 mg/dL (ref 0.40–1.20)
GFR: 50.3 mL/min — AB (ref >60.00)
GLUCOSE: 98 mg/dL (ref 70–99)
POTASSIUM: 4 meq/L (ref 3.5–5.1)
SODIUM: 145 meq/L (ref 135–145)

## 2017-05-05 LAB — CBC WITH DIFFERENTIAL/PLATELET
BASOS PCT: 0.6 % (ref 0.0–3.0)
Basophils Absolute: 0 10*3/uL (ref 0.0–0.1)
EOS ABS: 0.2 10*3/uL (ref 0.0–0.7)
Eosinophils Relative: 2.8 % (ref 0.0–5.0)
HEMATOCRIT: 41.7 % (ref 36.0–46.0)
HEMOGLOBIN: 13.7 g/dL (ref 12.0–15.0)
LYMPHS PCT: 17.1 % (ref 12.0–46.0)
Lymphs Abs: 1 10*3/uL (ref 0.7–4.0)
MCHC: 32.9 g/dL (ref 30.0–36.0)
MCV: 92.1 fl (ref 78.0–100.0)
Monocytes Absolute: 0.5 10*3/uL (ref 0.1–1.0)
Monocytes Relative: 9.5 % (ref 3.0–12.0)
Neutro Abs: 4 10*3/uL (ref 1.4–7.7)
Neutrophils Relative %: 70 % (ref 43.0–77.0)
Platelets: 99 10*3/uL — ABNORMAL LOW (ref 150.0–400.0)
RBC: 4.53 Mil/uL (ref 3.87–5.11)
RDW: 15.6 % — AB (ref 11.5–15.5)
WBC: 5.7 10*3/uL (ref 4.0–10.5)

## 2017-05-05 LAB — ALT: ALT: 11 U/L (ref 0–35)

## 2017-05-05 LAB — AST: AST: 18 U/L (ref 0–37)

## 2017-05-05 NOTE — Assessment & Plan Note (Signed)
HTN: Currently on Lasix, metoprolol, calan, seems controlled, check a BMP Hyperlipidemia: On Lipitor, check AST ALT Asthma: Recently exacerbated, see HPI, exam is benign today, check a chest x-ray to be sure.  Otherwise continue same medications, she is improving. Abdominal pain: See HPI, exam benign, we are checking a CBC, seen next.  Recommend observation Thrombocytopenia: See last CBC, rechecking today Back pain: Takes Ultram very rarely, has used occasionally a fentanyl patch but gets dizzy with it.  Recommend to discuss with Dr. Nelva Bush . Has gained weight, no edema, we are checking chest x-ray but I doubt CHF. Had a flu shot INR in 2 weeks RTC 4 months

## 2017-05-07 ENCOUNTER — Encounter: Payer: Self-pay | Admitting: Internal Medicine

## 2017-05-19 ENCOUNTER — Ambulatory Visit: Payer: Medicare Other

## 2017-05-21 ENCOUNTER — Telehealth: Payer: Self-pay | Admitting: Internal Medicine

## 2017-05-21 NOTE — Telephone Encounter (Signed)
INR appt scheduled for 06/04/17. No sooner appts available. Call pt if we need to see her before then and if we have an appt sooner that can be worked in.

## 2017-05-21 NOTE — Telephone Encounter (Signed)
put her on my schedule anytime next week.

## 2017-05-21 NOTE — Telephone Encounter (Signed)
Last INR was 04/22/2017- was normal and instructed to return in 4 weeks. Due to the Thanksgiving holiday- first available INR visit is 06/04/2017- is it okay for Pt to go this long?

## 2017-05-25 NOTE — Telephone Encounter (Signed)
Noted! Thank you

## 2017-05-25 NOTE — Telephone Encounter (Signed)
Patient scheduled with PCP for 05/31/2017 at 3pm.

## 2017-05-26 ENCOUNTER — Other Ambulatory Visit: Payer: Self-pay | Admitting: Internal Medicine

## 2017-05-31 ENCOUNTER — Ambulatory Visit (INDEPENDENT_AMBULATORY_CARE_PROVIDER_SITE_OTHER): Payer: Medicare Other

## 2017-05-31 DIAGNOSIS — I4891 Unspecified atrial fibrillation: Secondary | ICD-10-CM | POA: Diagnosis not present

## 2017-05-31 DIAGNOSIS — Z7901 Long term (current) use of anticoagulants: Secondary | ICD-10-CM

## 2017-05-31 DIAGNOSIS — Z0000 Encounter for general adult medical examination without abnormal findings: Secondary | ICD-10-CM

## 2017-05-31 LAB — POCT INR: INR: 2.1

## 2017-05-31 NOTE — Progress Notes (Signed)
Pre visit review using our clinic tool,if applicable. No additional management support is needed unless otherwise documented below in the visit note.   Patient in for Office visit which was changed to INR check.  Last INR = 2.1  Patient currently taking Coumadin 2.5 mg daily except for Thursday she takes 5 mg.  INR today = 2.1 same as last check.  No complaints voiced this visit . Per Dr. Larose Kells patient to continue Coumadin as ordered on last visit and return for INR check in 4 weeks. Patient advised. Patients daughter states she will call back and schedule appointment for re-check.  Kathlene November, MD

## 2017-06-04 ENCOUNTER — Ambulatory Visit: Payer: Medicare Other

## 2017-06-05 ENCOUNTER — Other Ambulatory Visit: Payer: Self-pay | Admitting: Internal Medicine

## 2017-06-24 ENCOUNTER — Ambulatory Visit (INDEPENDENT_AMBULATORY_CARE_PROVIDER_SITE_OTHER): Payer: Medicare Other

## 2017-06-24 DIAGNOSIS — Z7901 Long term (current) use of anticoagulants: Secondary | ICD-10-CM

## 2017-06-24 LAB — POCT INR: INR: 1.7

## 2017-06-24 NOTE — Progress Notes (Signed)
POCTPre visit review using our clinic tool,if applicable. No additional management support is needed unless otherwise documented below in the visit note.   Patient in for INR check per order from Dr. Larose Kells.  Last INR = 2.1  Goal = 2.0-3.0  Patient currently taking Warfarin 2.5 mg daily except Thursday she takes 5 mg.  No complaints voiced this visit.  INR today = 1.7  Per Dr. Nani Ravens, DOD patient to take Warfarin  2.5 mg daily except Monday and Thursday take 5 mg. Return for re-check of INR in 2 weeks. Patient agreed. Will schedule at check out.

## 2017-06-24 NOTE — Progress Notes (Signed)
Noted. Agree with above.  [place], DO 06/24/17 3:53 PM

## 2017-07-08 ENCOUNTER — Ambulatory Visit: Payer: Medicare Other

## 2017-07-14 ENCOUNTER — Ambulatory Visit (INDEPENDENT_AMBULATORY_CARE_PROVIDER_SITE_OTHER): Payer: Medicare Other

## 2017-07-14 DIAGNOSIS — Z7901 Long term (current) use of anticoagulants: Secondary | ICD-10-CM

## 2017-07-14 LAB — POCT INR: INR: 2.3

## 2017-07-14 NOTE — Progress Notes (Signed)
Pre visit review using our clinic tool,if applicable. No additional management support is needed unless otherwise documented below in the visit note.   Patient in for INR check today per order from [facility].  No complaints voiced this visit  INR last visit = 1.7  Patient taking Coumadin 2.5 daily except for Monday and Thursday patient takes 5 mg.  Per Dr. Lorelei Pont DOD patient to continue medications as ordered and return for return INR check in 1 month.

## 2017-07-14 NOTE — Patient Instructions (Signed)
Per Dr. Lorelei Pont DOD patient to continue medications as ordered and return for return INR check in 1 month.

## 2017-08-04 ENCOUNTER — Other Ambulatory Visit: Payer: Self-pay | Admitting: Internal Medicine

## 2017-08-13 ENCOUNTER — Ambulatory Visit: Payer: Medicare Other

## 2017-08-16 ENCOUNTER — Ambulatory Visit (HOSPITAL_BASED_OUTPATIENT_CLINIC_OR_DEPARTMENT_OTHER)
Admission: RE | Admit: 2017-08-16 | Discharge: 2017-08-16 | Disposition: A | Discharge status: Home / Self Care | Payer: Medicare Other | Source: Ambulatory Visit | Attending: Internal Medicine | Admitting: Internal Medicine

## 2017-08-16 ENCOUNTER — Ambulatory Visit (INDEPENDENT_AMBULATORY_CARE_PROVIDER_SITE_OTHER): Payer: Medicare Other | Admitting: Internal Medicine

## 2017-08-16 ENCOUNTER — Encounter: Payer: Self-pay | Admitting: Internal Medicine

## 2017-08-16 VITALS — BP 132/84 | HR 85 | Temp 97.7°F | Resp 14 | Ht 65.0 in | Wt 187.2 lb

## 2017-08-16 DIAGNOSIS — D696 Thrombocytopenia, unspecified: Secondary | ICD-10-CM | POA: Diagnosis not present

## 2017-08-16 DIAGNOSIS — I4891 Unspecified atrial fibrillation: Secondary | ICD-10-CM

## 2017-08-16 DIAGNOSIS — I10 Essential (primary) hypertension: Secondary | ICD-10-CM | POA: Diagnosis not present

## 2017-08-16 DIAGNOSIS — E785 Hyperlipidemia, unspecified: Secondary | ICD-10-CM

## 2017-08-16 DIAGNOSIS — J4520 Mild intermittent asthma, uncomplicated: Secondary | ICD-10-CM | POA: Diagnosis not present

## 2017-08-16 DIAGNOSIS — I700 Atherosclerosis of aorta: Secondary | ICD-10-CM | POA: Insufficient documentation

## 2017-08-16 DIAGNOSIS — R0602 Shortness of breath: Secondary | ICD-10-CM

## 2017-08-16 LAB — LIPID PANEL
CHOLESTEROL: 97 mg/dL (ref 0–200)
HDL: 37.7 mg/dL — ABNORMAL LOW (ref >39.00)
LDL CALC: 48 mg/dL (ref 0–99)
NonHDL: 58.95
TRIGLYCERIDES: 56 mg/dL (ref 0.0–149.0)
Total CHOL/HDL Ratio: 3
VLDL: 11.2 mg/dL (ref 0.0–40.0)

## 2017-08-16 LAB — CBC WITH DIFFERENTIAL/PLATELET
BASOS PCT: 0.6 %
Basophils Absolute: 32 cells/uL (ref 0–200)
Eosinophils Absolute: 133 cells/uL (ref 15–500)
Eosinophils Relative: 2.5 %
HEMATOCRIT: 40.6 % (ref 35.0–45.0)
HEMOGLOBIN: 13.9 g/dL (ref 11.7–15.5)
LYMPHS ABS: 1002 {cells}/uL (ref 850–3900)
MCH: 30.5 pg (ref 27.0–33.0)
MCHC: 34.2 g/dL (ref 32.0–36.0)
MCV: 89 fL (ref 80.0–100.0)
MPV: 12.7 fL — AB (ref 7.5–12.5)
Monocytes Relative: 8.9 %
NEUTROS ABS: 3662 {cells}/uL (ref 1500–7800)
Neutrophils Relative %: 69.1 %
Platelets: 92 10*3/uL — ABNORMAL LOW (ref 140–400)
RBC: 4.56 10*6/uL (ref 3.80–5.10)
RDW: 13.3 % (ref 11.0–15.0)
TOTAL LYMPHOCYTE: 18.9 %
WBC: 5.3 10*3/uL (ref 3.8–10.8)
WBCMIX: 472 {cells}/uL (ref 200–950)

## 2017-08-16 LAB — POCT INR: INR: 2.4

## 2017-08-16 LAB — BASIC METABOLIC PANEL
BUN: 22 mg/dL (ref 6–23)
CHLORIDE: 110 meq/L (ref 96–112)
CO2: 25 mEq/L (ref 19–32)
CREATININE: 1.21 mg/dL — AB (ref 0.40–1.20)
Calcium: 9.6 mg/dL (ref 8.4–10.5)
GFR: 44.56 mL/min — AB (ref >60.00)
Glucose, Bld: 92 mg/dL (ref 70–99)
Potassium: 4 mEq/L (ref 3.5–5.1)
Sodium: 145 mEq/L (ref 135–145)

## 2017-08-16 NOTE — Patient Instructions (Signed)
GO TO THE LAB : Get the blood work     GO TO THE FRONT DESK Continue the same Coumadin, next Coumadin check 4 weeks  Schedule your next appointment for a checkup in 4-6 months   STOP BY THE FIRST FLOOR:  get the XR   Alternatives to Coumadin: Eliquis, Xarelto.  Please check on those meds

## 2017-08-16 NOTE — Progress Notes (Signed)
Pre visit review using our clinic review tool, if applicable. No additional management support is needed unless otherwise documented below in the visit note. 

## 2017-08-16 NOTE — Progress Notes (Signed)
Subjective:    Patient ID: [name], female    DOB: 1926/07/09, 84 y.o.   MRN: [id number]  DOS:  08/16/2017 Type of visit - description : rov Interval history: She is here with her daughter. -Her main concern is asthma: Good compliance of medication, she complains of wheezing and DOE, for the last few months. No actual cough.  No difficulty breathing at rest.  also has decreased platelets: Due for CBC -Coumadin management: Due for an INR today -HTN: Good compliance to medication, monitor BPs normal  Review of Systems Denies fever chills No GERD symptoms Lower extremity edema mild and at baseline. Denies orthopnea No gross hematuria or blood in the stools.  Past Medical History:  Diagnosis Date  . Acute encephalopathy   . Acute renal failure superimposed on stage 3 chronic kidney disease ([place])   . Anemia   . Atrial fibrillation ([place])   . Colon cancer ([place]) 12/11  . Colon cancer ([place]) 2011  . Diarrhea   . DVT (deep venous thrombosis) ([place]) 11/06  . Elevated brain natriuretic peptide (BNP) level   . Frequent headaches   . Heart murmur   . Hyperkalemia   . Hypertension   . Pulmonary embolism ([place]) 11/06  . Seizures ([place])   . Stroke [facility]) 11/30/2014    Past Surgical History:  Procedure Laterality Date  . APPENDECTOMY  1952  . COLECTOMY  06/2010   partial, Dr. Donne Hazel complicated by LLE CVT; S/P IVC umbrella & anemia  . HIP FRACTURE SURGERY  2006   Trauma  . TOTAL ABDOMINAL HYSTERECTOMY W/ BILATERAL SALPINGOOPHORECTOMY  1973   For Fibroids  . TOTAL HIP ARTHROPLASTY  01/03/07   Left hip replacement.  Marland Kitchen VENA CAVA FILTER PLACEMENT  06/28/2010    Social History   Socioeconomic History  . Marital status: Widowed    Spouse name: Not on file  . Number of children: 1  . Years of education: Not on file  . Highest education level: Not on file  Social Needs  . Financial resource strain: Not on file  . Food insecurity - worry: Not on file  . Food insecurity -  inability: Not on file  . Transportation needs - medical: Not on file  . Transportation needs - non-medical: Not on file  Occupational History  . Occupation: n/d    Employer: RETIRED  Tobacco Use  . Smoking status: Never Smoker  . Smokeless tobacco: Never Used  Substance and Sexual Activity  . Alcohol use: No    Alcohol/week: 0.0 oz  . Drug use: No  . Sexual activity: Not on file  Other Topics Concern  . Not on file  Social History Narrative   Lives w/ her daughter       Allergies as of 08/16/2017   No Known Allergies     Medication List        Accurate as of 08/16/17 11:59 PM. Always use your most recent med list.          acetaminophen 500 MG tablet Commonly known as:  TYLENOL Take 500 mg by mouth every 8 (eight) hours as needed for mild pain.   albuterol 108 (90 Base) MCG/ACT inhaler Commonly known as:  PROAIR HFA Inhale 2 puffs into the lungs every 6 (six) hours as needed for wheezing or shortness of breath.   albuterol (2.5 MG/3ML) 0.083% nebulizer solution Commonly known as:  PROVENTIL Take 3 mLs (2.5 mg total) by nebulization 3 (three) times daily as needed for  wheezing or shortness of breath.   ALPRAZolam 0.25 MG tablet Commonly known as:  XANAX Take 1 tablet (0.25 mg total) by mouth 2 (two) times daily as needed for anxiety.   aspirin 81 MG tablet Take 81 mg by mouth daily.   atorvastatin 20 MG tablet Commonly known as:  LIPITOR Take 1 tablet (20 mg total) by mouth daily.   CALCIUM 1200 PO Take 2 tablets by mouth daily.   Fluticasone-Salmeterol 100-50 MCG/DOSE Aepb Commonly known as:  ADVAIR DISKUS Inhale 1 puff into the lungs 2 (two) times daily.   furosemide 40 MG tablet Commonly known as:  LASIX Take 1 tablet (40 mg total) by mouth daily.   loperamide 2 MG tablet Commonly known as:  IMODIUM A-D Give 2 mg by mouth with first loose stool. Give 2 mg by mouth for additional loose stool.   loratadine 10 MG tablet Commonly known as:   CLARITIN Take 10 mg by mouth daily.   metoprolol tartrate 25 MG tablet Commonly known as:  LOPRESSOR Take 1 tablet (25 mg total) by mouth 2 (two) times daily.   MULTIPLE VITAMINS/WOMENS PO Take by mouth daily.   promethazine 12.5 MG tablet Commonly known as:  PHENERGAN Take 1 tablet (12.5 mg total) by mouth 3 (three) times daily as needed for nausea or vomiting.   traMADol 50 MG tablet Commonly known as:  ULTRAM Take 1 tablet (50 mg total) by mouth every 6 (six) hours as needed for moderate pain.   verapamil 120 MG tablet Commonly known as:  CALAN Take 1 tablet (120 mg total) by mouth 2 (two) times daily.   warfarin 2.5 MG tablet Commonly known as:  COUMADIN Take as directed by the anticoagulation clinic. If you are unsure how to take this medication, talk to your nurse or doctor. Original instructions:  USE AS DIRECTED BY COUMADIN CLINIC          Objective:   Physical Exam BP 132/84 (BP Location: Left Arm, Patient Position: Sitting, Cuff Size: Normal)   Pulse 85   Temp 97.7 F (36.5 C) (Oral)   Resp 14   Ht 5\' 5"  (1.651 m)   Wt 187 lb 4 oz (84.9 kg)   SpO2 95%   BMI 31.16 kg/m  General:   Well developed, elderly lady, sitting in a wheelchair, in no distress. HEENT:  Normocephalic . Face symmetric, atraumatic Lungs:  Decreased breath sounds but clear Normal respiratory effort, no intercostal retractions, no accessory muscle use. Heart: Irregularly irregular , no murmur.  No pretibial edema bilaterally  Skin: Not pale. Not jaundice Neurologic:  alert & oriented X3.  Speech normal, gait not tested, sitting in a wheelchair Psych--  Cognition and judgment appear intact.  Cooperative with normal attention span and concentration.  Behavior appropriate. No anxious or depressed appearing.      Assessment & Plan:   Assessment (transfer Dr Linna Darner [phone]) HTN Hyperlipidemia insomnia - xanax prn Asthma  Atrial fibrillation--rate controlled, anticoag Stroke  11-2014, no residual deficits (TIA?) Heart murmur: Mitral regurgitation DVT and pulmonary emboli 2006, vena cava filter 2011  MSK: tramadol qd  --DJD --Back pain has seen Dr Nelva Bush, dr Rolena Infante, MRI: djd, stenosis, med treatment Colon cancer 2011 -- Dr Annitta Jersey , d/c from oncology 06-2015 , No further colonoscopies  PLAN HTN: Under good control with Lasix, metoprolol, calan, check a BMP Hyperlipidemia: On Lipitor, check a FLP. Insomnia: Very rarely takes Xanax. Asthma: Complaining of DOE and wheezing despite taking Advair and albuterol.  Etiology  unclear, does not seem to have a cardiac asthma.  Plan: CXR, and refer to pulmonary. Thrombocytopenia: Check a CBC Atrial fibrillation, anticoagulation: Good compliance with Coumadin, INR 2.4, no change, 4 weeks.  We talk about options such as Eliquis or Xarelto.  Patient will think about it, cost is likely an issue. DJD, back pain: Self DC fentanyl, he started to use it prn and whenever she went back to use it she got "high".  Rarely uses Ultram. RTC 4-6 months.  INR: 1 month

## 2017-08-17 NOTE — Assessment & Plan Note (Signed)
HTN: Under good control with Lasix, metoprolol, calan, check a BMP Hyperlipidemia: On Lipitor, check a FLP. Insomnia: Very rarely takes Xanax. Asthma: Complaining of DOE and wheezing despite taking Advair and albuterol.  Etiology unclear, does not seem to have a cardiac asthma.  Plan: CXR, and refer to pulmonary. Thrombocytopenia: Check a CBC Atrial fibrillation, anticoagulation: Good compliance with Coumadin, INR 2.4, no change, 4 weeks.  We talk about options such as Eliquis or Xarelto.  Patient will think about it, cost is likely an issue. DJD, back pain: Self DC fentanyl, he started to use it prn and whenever she went back to use it she got "high".  Rarely uses Ultram. RTC 4-6 months.  INR: 1 month

## 2017-08-19 ENCOUNTER — Telehealth: Payer: Self-pay | Admitting: Internal Medicine

## 2017-08-19 NOTE — Telephone Encounter (Signed)
Copied from [place]. Topic: Quick Communication - See Telephone Encounter >> Aug 19, 2017  4:08 PM Vernona Rieger wrote: CRM for notification. See Telephone encounter for:   08/19/17.  [name] (daughter) called and said that she needs to sch a INR check for her. Please call her @ [phone]  She wants to know if she could do it on 09/13/17

## 2017-08-19 NOTE — Telephone Encounter (Signed)
Please call Pt's daughter [name] and schedule INR visit around 09/13/2017. (nurse visit only). Thank you.

## 2017-09-03 ENCOUNTER — Ambulatory Visit: Payer: Medicare Other | Admitting: Internal Medicine

## 2017-09-07 ENCOUNTER — Other Ambulatory Visit: Payer: Self-pay | Admitting: Internal Medicine

## 2017-09-13 ENCOUNTER — Institutional Professional Consult (permissible substitution): Payer: Medicare Other | Admitting: Pulmonary Disease

## 2017-09-15 ENCOUNTER — Other Ambulatory Visit: Payer: Self-pay | Admitting: Internal Medicine

## 2017-09-23 ENCOUNTER — Telehealth: Payer: Self-pay | Admitting: Internal Medicine

## 2017-09-23 NOTE — Telephone Encounter (Signed)
Please inform Pt's daughter, [name]- that she is due for INR- schedule nurse visit at their convenience. Thank you.

## 2017-09-23 NOTE — Telephone Encounter (Signed)
Due for an INR, please arrange 

## 2017-09-23 NOTE — Telephone Encounter (Signed)
Scheduled on 10/07/17 @ 2:45pm

## 2017-09-23 NOTE — Telephone Encounter (Signed)
Noted  

## 2017-09-29 ENCOUNTER — Telehealth: Payer: Self-pay | Admitting: Cardiovascular Disease

## 2017-09-29 NOTE — Telephone Encounter (Signed)
New Message    Pt c/o Shortness Of Breath: STAT if SOB developed within the last 24 hours or pt is noticeably SOB on the phone  1. Are you currently SOB (can you hear that pt is SOB on the phone)? Yes, did not speak with patient spoke with daughter   2. How long have you been experiencing SOB? For about 2 months  3. Are you SOB when sitting or when up moving around? Whenever she is sitting or moving  4. Are you currently experiencing any other symptoms? Patients daughter says she has been seen by the pulmonologist and they are not finding anything wrong. She states that she is out of breath with just a few steps. She says her mother quickly falls asleep anytime. I did make the patient an appointment and added to the wait list as well. Please call to discuss.

## 2017-09-29 NOTE — Telephone Encounter (Signed)
I spoke with pt's daughter who reports pt has been having increasing shortness of breath for last 2 months. Occurs with exertion such as walking across the house.  Sometimes associated with wheezing. Pt is scheduled to see pulmonary on April 4,2019 and plans on keeping this appointment.  Daughter is concerned shortness of breath my be heart related.  I scheduled pt to see Ermalinda Barrios, PA on April 10,2019 at 11:30

## 2017-10-01 ENCOUNTER — Other Ambulatory Visit: Payer: Self-pay

## 2017-10-01 MED ORDER — WARFARIN SODIUM 2.5 MG PO TABS: ORAL_TABLET | ORAL | 0 refills | Status: DC

## 2017-10-07 ENCOUNTER — Ambulatory Visit (INDEPENDENT_AMBULATORY_CARE_PROVIDER_SITE_OTHER): Payer: Medicare Other | Admitting: *Deleted

## 2017-10-07 ENCOUNTER — Ambulatory Visit (INDEPENDENT_AMBULATORY_CARE_PROVIDER_SITE_OTHER): Payer: Medicare Other | Admitting: Pulmonary Disease

## 2017-10-07 ENCOUNTER — Encounter: Payer: Self-pay | Admitting: Pulmonary Disease

## 2017-10-07 VITALS — BP 153/92 | HR 83

## 2017-10-07 DIAGNOSIS — J4520 Mild intermittent asthma, uncomplicated: Secondary | ICD-10-CM

## 2017-10-07 DIAGNOSIS — I4891 Unspecified atrial fibrillation: Secondary | ICD-10-CM

## 2017-10-07 DIAGNOSIS — I482 Chronic atrial fibrillation, unspecified: Secondary | ICD-10-CM

## 2017-10-07 DIAGNOSIS — I270 Primary pulmonary hypertension: Secondary | ICD-10-CM

## 2017-10-07 DIAGNOSIS — R0600 Dyspnea, unspecified: Secondary | ICD-10-CM | POA: Diagnosis not present

## 2017-10-07 LAB — POCT INR: INR: 2.9

## 2017-10-07 NOTE — Progress Notes (Signed)
Pre visit review using our clinic review tool, if applicable. No additional management support is needed unless otherwise documented below in the visit note.  Pt here for INR check per Dr Larose Kells for atrial fibrillation.  Pt currently taking Coumadin 2.5mg  daily except for 5mg  on Monday and Thursday. (total=22.5mg /wk)  Last INR = 2.4 on 08/16/17  INR today = 2.9  Pt denies recent antibiotics, no dietary changes and no unusual bruising or bleeding.  Advised pt per Doc of Day Carollee Herter) to continue current dose of Coumadin and return in 1 month for recheck. Appointment scheduled for 11/02/17 at 2:30pm.

## 2017-10-07 NOTE — Patient Instructions (Signed)
For shortness of breath: We will check a lung function test We will check an echocardiogram to evaluate pulmonary hypertension If the lung function test is abnormal we may need to get a CT scan of the chest  For your history of asthma: Continue taking Advair as you are doing Use albuterol as needed for chest tightness wheezing or shortness of breath  Atrial fibrillation: Continue taking your medicines as directed by the cardiology clinic  Pulmonary hypertension: This term means of high blood pressure in the lungs.  An echocardiogram in 2017 showed evidence of this.  This can cause shortness of breath.  I am concerned that it may be worse based on your recent symptoms. Were going to repeat an echocardiogram now  We will see you back in 1-2 weeks to go over the results of the echocardiogram and lung function test, you will likely see a nurse practitioner prior to seeing me.

## 2017-10-07 NOTE — Progress Notes (Addendum)
Subjective:   PATIENT ID: [name], [name], MRN: [id number]  Synopsis: Referred in April 2019 for Dyspnea, has a history of a-fib and DVT  HPI  Chief Complaint  Patient presents with  . Consult    Referred by Dr. Larose Kells for asthma, dyspnea.    Significant shortness of breath: > started in 2018 when she was very sick and she saw Dr. Larose Kells. > she feels like it hasn't not improved at all since then > she can just walk a few feet in the house and starts to wheeze and struggle to breathe > there may be slight improvement in the last few weeks > she needed the albuterol frequently at first, but this has decreased a little > she initially had some pain in her right flank/abdomen but this subsided > she is wheezing more than normal > some cough, "not real bad"  Leg swelling > her right leg swells more than the left > she takes lasix three times a week > the swelling hasn't correlated with her shortness of breath  She never smoked.  She worked in [employer], research (medical).  She has never been told she had an autoimmune problem.    No recent water damage or mold, mildew, no water damage, no new animals.  She has lived in the same house for several years.    Asthma: > "I haven't had much asthma in my lifetime, only a little" > she has been on advair for more than a year  No respiratory problems as a kid.  She doesn't recall childhood pneumonia.   She has a history of blood clot and has an IVC filter. > she has had a pulmonary embolism and DVT on separate occassions > she had the filter placed due to severe bleeding after her colonc cancer surgery and she was bleeding  She has a history of Afib > diagnosed in 2011 > her heart was racing at the time and she saw Dr. Linna Darner > she was admitted to [facility] and she had a diagnosis of colon cancer and afib at that time > she has followed with cardiology > she takes warfarin since 2006  Past Medical History:  Diagnosis Date  .  Acute encephalopathy   . Acute renal failure superimposed on stage 3 chronic kidney disease ([place])   . Anemia   . Atrial fibrillation ([place])   . Colon cancer ([place]) 12/11  . Colon cancer ([place]) 2011  . Diarrhea   . DVT (deep venous thrombosis) ([place]) 11/06  . Elevated brain natriuretic peptide (BNP) level   . Frequent headaches   . Heart murmur   . Hyperkalemia   . Hypertension   . Pulmonary embolism ([place]) 11/06  . Seizures ([place])   . Stroke [facility] 11/30/2014     Family History  Problem Relation Age of Onset  . Peripheral vascular disease Father   . Heart attack Mother 43  . Heart disease Mother        MI  . Coronary artery disease Sister   . Diabetes Paternal Grandmother   . Coronary artery disease Maternal Grandfather      Social History   Socioeconomic History  . Marital status: Widowed    Spouse name: Not on file  . Number of children: 1  . Years of education: Not on file  . Highest education level: Not on file  Occupational History  . Occupation: n/d    Employer: RETIRED  Social Needs  . Financial  resource strain: Not on file  . Food insecurity:    Worry: Not on file    Inability: Not on file  . Transportation needs:    Medical: Not on file    Non-medical: Not on file  Tobacco Use  . Smoking status: Passive Smoke Exposure - Never Smoker  . Smokeless tobacco: Never Used  . Tobacco comment: husband smoked in home.   Substance and Sexual Activity  . Alcohol use: No    Alcohol/week: 0.0 oz  . Drug use: No  . Sexual activity: Not on file  Lifestyle  . Physical activity:    Days per week: Not on file    Minutes per session: Not on file  . Stress: Not on file  Relationships  . Social connections:    Talks on phone: Not on file    Gets together: Not on file    Attends religious service: Not on file    Active member of club or organization: Not on file    Attends meetings of clubs or organizations: Not on file    Relationship status: Not on file  . Intimate  partner violence:    Fear of current or ex partner: Not on file    Emotionally abused: Not on file    Physically abused: Not on file    Forced sexual activity: Not on file  Other Topics Concern  . Not on file  Social History Narrative   Lives w/ her daughter      No Known Allergies   Outpatient Medications Prior to Visit  Medication Sig Dispense Refill  . acetaminophen (TYLENOL) 500 MG tablet Take 500 mg by mouth every 8 (eight) hours as needed for mild pain.     Marland Kitchen albuterol (PROVENTIL HFA;VENTOLIN HFA) 108 (90 Base) MCG/ACT inhaler Inhale 2 puffs into the lungs every 6 (six) hours as needed for wheezing or shortness of breath. 18 g 5  . albuterol (PROVENTIL) (2.5 MG/3ML) 0.083% nebulizer solution Take 3 mLs (2.5 mg total) by nebulization 3 (three) times daily as needed for wheezing or shortness of breath. 150 mL 5  . ALPRAZolam (XANAX) 0.25 MG tablet Take 1 tablet (0.25 mg total) by mouth 2 (two) times daily as needed for anxiety. 60 tablet 3  . aspirin 81 MG tablet Take 81 mg by mouth daily.    Marland Kitchen atorvastatin (LIPITOR) 20 MG tablet Take 1 tablet (20 mg total) by mouth daily. 90 tablet 2  . Calcium Carbonate-Vit D-Min (CALCIUM 1200 PO) Take 2 tablets by mouth daily.     . Fluticasone-Salmeterol (ADVAIR DISKUS) 100-50 MCG/DOSE AEPB Inhale 1 puff into the lungs 2 (two) times daily. 60 each 5  . furosemide (LASIX) 40 MG tablet Take 1 tablet (40 mg total) by mouth daily. Hendricks tablet 1  . loperamide (IMODIUM A-D) 2 MG tablet Give 2 mg by mouth with first loose stool. Give 2 mg by mouth for additional loose stool.    Marland Kitchen loratadine (CLARITIN) 10 MG tablet Take 10 mg by mouth daily.    . metoprolol tartrate (LOPRESSOR) 25 MG tablet Take 1 tablet (25 mg total) by mouth 2 (two) times daily. 180 tablet 1  . Multiple Vitamins-Minerals (MULTIPLE VITAMINS/WOMENS PO) Take by mouth daily.    . promethazine (PHENERGAN) 12.5 MG tablet Take 1 tablet (12.5 mg total) by mouth 3 (three) times daily as needed for  nausea or vomiting. Hendricks tablet 1  . traMADol (ULTRAM) 50 MG tablet Take 1 tablet (50 mg total) by mouth  every 6 (six) hours as needed for moderate pain. Hendricks tablet 1  . verapamil (CALAN) 120 MG tablet Take 1 tablet (120 mg total) by mouth 2 (two) times daily. 180 tablet 1  . warfarin (COUMADIN) 2.5 MG tablet USE AS DIRECTED BY COUMADIN CLINIC 90 tablet 0   No facility-administered medications prior to visit.     Review of Systems  Constitutional: Negative for chills, fever, malaise/fatigue and weight loss.  HENT: Negative for congestion, nosebleeds, sinus pain and sore throat.   Eyes: Negative for photophobia, pain and discharge.  Respiratory: Positive for shortness of breath and wheezing. Negative for cough, hemoptysis and sputum production.   Cardiovascular: Positive for leg swelling. Negative for chest pain, palpitations and orthopnea.  Gastrointestinal: Negative for abdominal pain, constipation, diarrhea, nausea and vomiting.  Genitourinary: Negative for dysuria, frequency, hematuria and urgency.  Musculoskeletal: Negative for back pain, joint pain, myalgias and neck pain.  Skin: Negative for itching and rash.  Neurological: Negative for tingling, tremors, sensory change, speech change, focal weakness, seizures, weakness and headaches.  Psychiatric/Behavioral: Negative for memory loss, substance abuse and suicidal ideas. The patient is not nervous/anxious.       Objective:  Physical Exam   Vitals:   10/07/17 1329  BP: (!) 153/92  Pulse: 83  SpO2: 100%    RA  Gen: elderly female, chronically ill appearing, no acute distress HENT: NCAT, OP clear, neck supple without masses Eyes: PERRL, EOMi Lymph: no cervical lymphadenopathy PULM: Slight wheeze exp, no crackles B CV: Irreg irreg, no mgr, no JVD GI: BS+, soft, nontender, no hsm Derm: no rash or skin breakdown MSK: normal bulk and tone Neuro: A&Ox4, CN II-XII intact, strength 5/5 in all 4 extremities Psyche: normal mood  and affect   CBC    Component Value Date/Time   WBC 5.3 08/16/2017 1408   RBC 4.56 08/16/2017 1408   HGB 13.9 08/16/2017 1408   HGB 15.2 06/09/2012 1509   HCT 40.6 08/16/2017 1408   HCT 44.7 06/09/2012 1509   PLT 92 (L) 08/16/2017 1408   PLT 119 (L) 06/09/2012 1509   MCV 89.0 08/16/2017 1408   MCV 94.9 06/09/2012 1509   MCH Hendricks.5 08/16/2017 1408   MCHC 34.2 08/16/2017 1408   RDW 13.3 08/16/2017 1408   RDW 13.3 06/09/2012 1509   LYMPHSABS 1,002 08/16/2017 1408   LYMPHSABS 1.5 06/09/2012 1509   MONOABS 0.5 10/Hendricks/2018 1521   MONOABS 0.5 06/09/2012 1509   EOSABS 133 08/16/2017 1408   EOSABS 0.1 06/09/2012 1509   BASOSABS 32 08/16/2017 1408   BASOSABS 0.0 06/09/2012 1509     Chest imaging: February 2019 chest x-ray which is independently reviewed showing normal pulmonary parenchyma, prominent pulmonary vasculature  PFT:  Labs:  Path:  Echo: March 2017 transthoracic echocardiogram showed LVEF of 55-60%, right ventricle systolic pressure 66 mmHg, normal systolic function, right atrium severely dilated, cannot exclude PFO  Heart Catheterization:       Assessment & Plan:   Dyspnea, unspecified type - Plan: Pulmonary function test, ECHOCARDIOGRAM COMPLETE  Mild intermittent asthma, unspecified whether complicated  Chronic atrial fibrillation (HCC)  Primary pulmonary hypertension ([place])  Discussion: This is a pleasant 84 year old female who has a past medical history significant for DVT and A. fib who comes to my clinic today for evaluation of shortness of breath.  She says that she had been given a diagnosis of asthma years ago but she says "it has never given me much trouble".  Objectively she has some mild wheezing  on physical exam, and increased AP diameter, history of DVT, and findings that are worrisome for pulmonary hypertension based on physical exam as well as chest x-ray.  I explained to her today that there is many different causes for shortness of breath  including heart disease lung disease blood disorders, neuromuscular weakness and other causes.  I am pleased that she is not anemic, there is nothing in the history that suggest that her heart disease is suddenly worsened.  I do question whether or not asthma could be worse because of all the wheezing she has been experiencing after the recent cold.  However, what appears to be the predominant objective abnormality are findings consistent with pulmonary hypertension.  Plan: For shortness of breath: We will check a lung function test We will check an echocardiogram to evaluate pulmonary hypertension If the lung function test is abnormal we may need to get a CT scan of the chest  For your history of asthma: Continue taking Advair as you are doing Use albuterol as needed for chest tightness wheezing or shortness of breath  Atrial fibrillation: Continue taking your medicines as directed by the cardiology clinic  Pulmonary hypertension: This term means of high blood pressure in the lungs.  An echocardiogram in 2017 showed evidence of this.  This can cause shortness of breath.  I am concerned that it may be worse based on your recent symptoms. Were going to repeat an echocardiogram now, if it shows worsening pulmonary hypertension we may need to check a V/Q scan to assess for CTEPH  We will see you back in 1-2 weeks to go over the results of the echocardiogram and lung function test, you will likely see a nurse practitioner prior to seeing me.   Current Outpatient Medications:  .  acetaminophen (TYLENOL) 500 MG tablet, Take 500 mg by mouth every 8 (eight) hours as needed for mild pain. , Disp: , Rfl:  .  albuterol (PROVENTIL HFA;VENTOLIN HFA) 108 (90 Base) MCG/ACT inhaler, Inhale 2 puffs into the lungs every 6 (six) hours as needed for wheezing or shortness of breath., Disp: 18 g, Rfl: 5 .  albuterol (PROVENTIL) (2.5 MG/3ML) 0.083% nebulizer solution, Take 3 mLs (2.5 mg total) by nebulization 3  (three) times daily as needed for wheezing or shortness of breath., Disp: 150 mL, Rfl: 5 .  ALPRAZolam (XANAX) 0.25 MG tablet, Take 1 tablet (0.25 mg total) by mouth 2 (two) times daily as needed for anxiety., Disp: 60 tablet, Rfl: 3 .  aspirin 81 MG tablet, Take 81 mg by mouth daily., Disp: , Rfl:  .  atorvastatin (LIPITOR) 20 MG tablet, Take 1 tablet (20 mg total) by mouth daily., Disp: 90 tablet, Rfl: 2 .  Calcium Carbonate-Vit D-Min (CALCIUM 1200 PO), Take 2 tablets by mouth daily. , Disp: , Rfl:  .  Fluticasone-Salmeterol (ADVAIR DISKUS) 100-50 MCG/DOSE AEPB, Inhale 1 puff into the lungs 2 (two) times daily., Disp: 60 each, Rfl: 5 .  furosemide (LASIX) 40 MG tablet, Take 1 tablet (40 mg total) by mouth daily., Disp: Hendricks tablet, Rfl: 1 .  loperamide (IMODIUM A-D) 2 MG tablet, Give 2 mg by mouth with first loose stool. Give 2 mg by mouth for additional loose stool., Disp: , Rfl:  .  loratadine (CLARITIN) 10 MG tablet, Take 10 mg by mouth daily., Disp: , Rfl:  .  metoprolol tartrate (LOPRESSOR) 25 MG tablet, Take 1 tablet (25 mg total) by mouth 2 (two) times daily., Disp: 180 tablet, Rfl: 1 .  Multiple Vitamins-Minerals (MULTIPLE VITAMINS/WOMENS PO), Take by mouth daily., Disp: , Rfl:  .  promethazine (PHENERGAN) 12.5 MG tablet, Take 1 tablet (12.5 mg total) by mouth 3 (three) times daily as needed for nausea or vomiting., Disp: Hendricks tablet, Rfl: 1 .  traMADol (ULTRAM) 50 MG tablet, Take 1 tablet (50 mg total) by mouth every 6 (six) hours as needed for moderate pain., Disp: Hendricks tablet, Rfl: 1 .  verapamil (CALAN) 120 MG tablet, Take 1 tablet (120 mg total) by mouth 2 (two) times daily., Disp: 180 tablet, Rfl: 1 .  warfarin (COUMADIN) 2.5 MG tablet, USE AS DIRECTED BY COUMADIN CLINIC, Disp: 90 tablet, Rfl: 0

## 2017-10-07 NOTE — Progress Notes (Signed)
Noted  Yvonne R Lowne Chase, DO  

## 2017-10-13 ENCOUNTER — Ambulatory Visit: Payer: Medicare Other | Admitting: Physician Assistant

## 2017-10-13 ENCOUNTER — Other Ambulatory Visit (HOSPITAL_COMMUNITY): Payer: Medicare Other

## 2017-10-13 ENCOUNTER — Other Ambulatory Visit: Payer: Self-pay | Admitting: Internal Medicine

## 2017-10-18 ENCOUNTER — Ambulatory Visit: Payer: Medicare Other | Admitting: Pulmonary Disease

## 2017-10-21 ENCOUNTER — Other Ambulatory Visit: Payer: Self-pay | Admitting: Internal Medicine

## 2017-10-27 ENCOUNTER — Other Ambulatory Visit: Payer: Self-pay | Admitting: Internal Medicine

## 2017-11-01 ENCOUNTER — Other Ambulatory Visit (HOSPITAL_BASED_OUTPATIENT_CLINIC_OR_DEPARTMENT_OTHER): Payer: Medicare Other

## 2017-11-02 ENCOUNTER — Ambulatory Visit: Payer: Medicare Other

## 2017-11-04 ENCOUNTER — Ambulatory Visit (INDEPENDENT_AMBULATORY_CARE_PROVIDER_SITE_OTHER): Payer: Medicare Other | Admitting: Cardiology

## 2017-11-04 ENCOUNTER — Ambulatory Visit: Payer: Medicare Other

## 2017-11-04 ENCOUNTER — Encounter

## 2017-11-04 ENCOUNTER — Encounter: Payer: Self-pay | Admitting: Cardiology

## 2017-11-04 VITALS — BP 144/72 | HR 100 | Ht 65.0 in | Wt 188.0 lb

## 2017-11-04 DIAGNOSIS — I4891 Unspecified atrial fibrillation: Secondary | ICD-10-CM

## 2017-11-04 DIAGNOSIS — R0609 Other forms of dyspnea: Secondary | ICD-10-CM | POA: Diagnosis not present

## 2017-11-04 DIAGNOSIS — I10 Essential (primary) hypertension: Secondary | ICD-10-CM

## 2017-11-04 MED ORDER — METOPROLOL TARTRATE 25 MG PO TABS: 37.5000 mg | ORAL_TABLET | Freq: Two times a day (BID) | ORAL | 3 refills | Status: DC

## 2017-11-04 NOTE — Progress Notes (Signed)
11/04/2017 [place]   04/11/1927  [id number]  Primary Physician Colon Branch, MD Primary Cardiologist: Dr. Angelena Form    Reason for Visit/CC: Yearly follow-up for atrial fibrillation  HPI:  [name] is a 84 y.o. female who is being seen today for routien yearly cardiovascular assessment.  She is followed by Dr. Angelena Form and has a history of persistent atrial fibrillation, moderate LVH, mild MR, anemia, DVT/PE status post IVC filter as well as a history of colon cancer status post colectomy.  She is on chronic anticoagulation with Coumadin for her history of DVT/PE as well as for atrial fibrillation.  Her INRs are followed by her PCP.   She is here with her daughter today.  She is in a wheelchair.  She denies chest pain.  No palpitations with her atrial fibrillation however she does have exertional dyspnea.  She denies any resting dyspnea.  She sleeps with 2 pillows which she has been sleeping with for many years.  She denies any PND.  No lower extremity edema.  She reports full medication compliance.  No recent weight gain.  EKG shows atrial fibrillation with a heart rate of 100 bpm.  Blood presure is 144/72.  Current Meds  Medication Sig  . acetaminophen (TYLENOL) 500 MG tablet Take 500 mg by mouth every 8 (eight) hours as needed for mild pain.   Marland Kitchen albuterol (PROVENTIL HFA;VENTOLIN HFA) 108 (90 Base) MCG/ACT inhaler Inhale 2 puffs into the lungs every 6 (six) hours as needed for wheezing or shortness of breath.  Marland Kitchen albuterol (PROVENTIL) (2.5 MG/3ML) 0.083% nebulizer solution Take 3 mLs (2.5 mg total) by nebulization 3 (three) times daily as needed for wheezing or shortness of breath.  . ALPRAZolam (XANAX) 0.25 MG tablet Take 1 tablet (0.25 mg total) by mouth 2 (two) times daily as needed for anxiety.  Marland Kitchen aspirin 81 MG tablet Take 81 mg by mouth daily.  Marland Kitchen atorvastatin (LIPITOR) 20 MG tablet Take 1 tablet (20 mg total) by mouth daily.  . Calcium Carbonate-Vit D-Min (CALCIUM 1200 PO) Take 2  tablets by mouth daily.   . Fluticasone-Salmeterol (ADVAIR DISKUS) 100-50 MCG/DOSE AEPB Inhale 1 puff into the lungs 2 (two) times daily.  . furosemide (LASIX) 40 MG tablet Take 1 tablet (40 mg total) by mouth daily.  Marland Kitchen loperamide (IMODIUM A-D) 2 MG tablet Give 2 mg by mouth with first loose stool. Give 2 mg by mouth for additional loose stool.  Marland Kitchen loratadine (CLARITIN) 10 MG tablet Take 10 mg by mouth daily.  . Multiple Vitamins-Minerals (MULTIPLE VITAMINS/WOMENS PO) Take by mouth daily.  . promethazine (PHENERGAN) 12.5 MG tablet Take 1 tablet (12.5 mg total) by mouth 3 (three) times daily as needed for nausea or vomiting.  . traMADol (ULTRAM) 50 MG tablet Take 1 tablet (50 mg total) by mouth every 6 (six) hours as needed for moderate pain.  . verapamil (CALAN) 120 MG tablet Take 1 tablet (120 mg total) by mouth 2 (two) times daily.  Marland Kitchen warfarin (COUMADIN) 2.5 MG tablet USE AS DIRECTED BY COUMADIN CLINIC  . [DISCONTINUED] metoprolol tartrate (LOPRESSOR) 25 MG tablet Take 1 tablet (25 mg total) by mouth 2 (two) times daily.   No Known Allergies Past Medical History:  Diagnosis Date  . Acute encephalopathy   . Acute renal failure superimposed on stage 3 chronic kidney disease ([place])   . Anemia   . Atrial fibrillation ([place])   . Colon cancer ([place]) 12/11  . Colon cancer ([place]) 2011  .  Diarrhea   . DVT (deep venous thrombosis) ([place]) 11/06  . Elevated brain natriuretic peptide (BNP) level   . Frequent headaches   . Heart murmur   . Hyperkalemia   . Hypertension   . Pulmonary embolism ([place]) 11/06  . Seizures ([place])   . Stroke [facility]) 11/30/2014   Family History  Problem Relation Age of Onset  . Peripheral vascular disease Father   . Heart attack Mother 58  . Heart disease Mother        MI  . Coronary artery disease Sister   . Diabetes Paternal Grandmother   . Coronary artery disease Maternal Grandfather    Past Surgical History:  Procedure Laterality Date  . APPENDECTOMY  1952  .  COLECTOMY  06/2010   partial, Dr. Donne Hazel complicated by LLE CVT; S/P IVC umbrella & anemia  . HIP FRACTURE SURGERY  2006   Trauma  . TOTAL ABDOMINAL HYSTERECTOMY W/ BILATERAL SALPINGOOPHORECTOMY  1973   For Fibroids  . TOTAL HIP ARTHROPLASTY  01/03/07   Left hip replacement.  Marland Kitchen VENA CAVA FILTER PLACEMENT  06/28/2010   Social History   Socioeconomic History  . Marital status: Widowed    Spouse name: Not on file  . Number of children: 1  . Years of education: Not on file  . Highest education level: Not on file  Occupational History  . Occupation: n/d    Employer: RETIRED  Social Needs  . Financial resource strain: Not on file  . Food insecurity:    Worry: Not on file    Inability: Not on file  . Transportation needs:    Medical: Not on file    Non-medical: Not on file  Tobacco Use  . Smoking status: Passive Smoke Exposure - Never Smoker  . Smokeless tobacco: Never Used  . Tobacco comment: husband smoked in home.   Substance and Sexual Activity  . Alcohol use: No    Alcohol/week: 0.0 oz  . Drug use: No  . Sexual activity: Not on file  Lifestyle  . Physical activity:    Days per week: Not on file    Minutes per session: Not on file  . Stress: Not on file  Relationships  . Social connections:    Talks on phone: Not on file    Gets together: Not on file    Attends religious service: Not on file    Active member of club or organization: Not on file    Attends meetings of clubs or organizations: Not on file    Relationship status: Not on file  . Intimate partner violence:    Fear of current or ex partner: Not on file    Emotionally abused: Not on file    Physically abused: Not on file    Forced sexual activity: Not on file  Other Topics Concern  . Not on file  Social History Narrative   Lives w/ her daughter      Review of Systems: General: negative for chills, fever, night sweats or weight changes.  Cardiovascular: negative for chest pain, dyspnea on  exertion, edema, orthopnea, palpitations, paroxysmal nocturnal dyspnea or shortness of breath Dermatological: negative for rash Respiratory: negative for cough or wheezing Urologic: negative for hematuria Abdominal: negative for nausea, vomiting, diarrhea, bright red blood per rectum, melena, or hematemesis Neurologic: negative for visual changes, syncope, or dizziness All other systems reviewed and are otherwise negative except as noted above.   Physical Exam:  Blood pressure (!) 144/72, pulse 100, height 5'  5" (1.651 m), weight 188 lb (85.3 kg), SpO2 95 %.  General appearance: alert, cooperative and no distress Neck: no carotid bruit and no JVD Lungs: clear to auscultation bilaterally Heart: irregularly irregular rhythm Extremities: extremities normal, atraumatic, no cyanosis or edema Pulses: 2+ and symmetric Skin: Skin color, texture, turgor normal. No rashes or lesions Neurologic: Grossly normal  EKG Atrial Fibrillation 100 bpm -- personally reviewed   ASSESSMENT AND PLAN:   1.  Persistent atrial fibrillation: Rate elevated at 100 bpm.  She reports exertional dyspnea.  Her blood pressure is 144/72.  We will increase her metoprolol dose to 37.5 mg twice daily for added rate control.  She is on chronic Coumadin therapy.  Her INR is followed by her PCP Dr. Larose Kells.  2. Exertional Dyspnea: Denies chest pain but has exertional dyspnea.  No resting dyspnea.  She appears euvolemic on physical exam however we will check a BNP to further assess. Will also check a CBC and BMP. We will also check a 2D echocardiogram.  Her symptoms may be secondary to A. fib with RVR.  We will increase her metoprolol dose to 37.5 mg twice daily for added rate control.  3. H/o DVT/PE: s/p IVC filter. On chronic coumadin. Followed by PCP.    Follow-Up in 2-3 weeks.  Brittainy Ladoris Gene, MHS [facility] 11/04/2017 3:31 PM

## 2017-11-04 NOTE — Patient Instructions (Addendum)
Medication Instructions:  Your physician has recommended you make the following change in your medication: 1.  INCREASE the Metoprolol 25 mg to 1 1/2 tablet by mouth twice a day   Labwork: SAME DAY AS ECHO:  BMET & PRO BNP   Testing/Procedures: Your physician has requested that you have an echocardiogram. Echocardiography is a painless test that uses sound waves to create images of your heart. It provides your doctor with information about the size and shape of your heart and how well your heart's chambers and valves are working. This procedure takes approximately one hour. There are no restrictions for this procedure.   Follow-Up: Your physician recommends that you schedule a follow-up appointment in: 2-3 WEEKS AFTER ECHO IS DONE   Any Other Special Instructions Will Be Listed Below (If Applicable).   Echocardiogram An echocardiogram, or echocardiography, uses sound waves (ultrasound) to produce an image of your heart. The echocardiogram is simple, painless, obtained within a short period of time, and offers valuable information to your health care provider. The images from an echocardiogram can provide information such as:  Evidence of coronary artery disease (CAD).  Heart size.  Heart muscle function.  Heart valve function.  Aneurysm detection.  Evidence of a past heart attack.  Fluid buildup around the heart.  Heart muscle thickening.  Assess heart valve function.  Tell a health care provider about:  Any allergies you have.  All medicines you are taking, including vitamins, herbs, eye drops, creams, and over-the-counter medicines.  Any problems you or family members have had with anesthetic medicines.  Any blood disorders you have.  Any surgeries you have had.  Any medical conditions you have.  Whether you are pregnant or may be pregnant. What happens before the procedure? No special preparation is needed. Eat and drink normally. What happens during the  procedure?  In order to produce an image of your heart, gel will be applied to your chest and a wand-like tool (transducer) will be moved over your chest. The gel will help transmit the sound waves from the transducer. The sound waves will harmlessly bounce off your heart to allow the heart images to be captured in real-time motion. These images will then be recorded.  You may need an IV to receive a medicine that improves the quality of the pictures. What happens after the procedure? You may return to your normal schedule including diet, activities, and medicines, unless your health care provider tells you otherwise. This information is not intended to replace advice given to you by your health care provider. Make sure you discuss any questions you have with your health care provider. Document Released: 06/19/2000 Document Revised: 02/08/2016 Document Reviewed: 02/27/2013 Elsevier Interactive Patient Education  2017 [name].     If you need a refill on your cardiac medications before your next appointment, please call your pharmacy.

## 2017-11-10 ENCOUNTER — Other Ambulatory Visit (HOSPITAL_COMMUNITY): Payer: Medicare Other

## 2017-11-10 ENCOUNTER — Ambulatory Visit: Payer: Medicare Other

## 2017-11-10 ENCOUNTER — Other Ambulatory Visit: Payer: Medicare Other

## 2017-11-18 ENCOUNTER — Ambulatory Visit (HOSPITAL_COMMUNITY): Payer: Medicare Other | Attending: Cardiovascular Disease

## 2017-11-18 ENCOUNTER — Other Ambulatory Visit: Payer: Self-pay

## 2017-11-18 ENCOUNTER — Other Ambulatory Visit: Payer: Medicare Other | Admitting: *Deleted

## 2017-11-18 ENCOUNTER — Ambulatory Visit (INDEPENDENT_AMBULATORY_CARE_PROVIDER_SITE_OTHER): Payer: Medicare Other

## 2017-11-18 DIAGNOSIS — I10 Essential (primary) hypertension: Secondary | ICD-10-CM | POA: Insufficient documentation

## 2017-11-18 DIAGNOSIS — Z8673 Personal history of transient ischemic attack (TIA), and cerebral infarction without residual deficits: Secondary | ICD-10-CM | POA: Diagnosis not present

## 2017-11-18 DIAGNOSIS — Z7901 Long term (current) use of anticoagulants: Secondary | ICD-10-CM

## 2017-11-18 DIAGNOSIS — I4891 Unspecified atrial fibrillation: Secondary | ICD-10-CM

## 2017-11-18 DIAGNOSIS — D649 Anemia, unspecified: Secondary | ICD-10-CM | POA: Diagnosis not present

## 2017-11-18 DIAGNOSIS — Z86711 Personal history of pulmonary embolism: Secondary | ICD-10-CM | POA: Diagnosis not present

## 2017-11-18 DIAGNOSIS — R011 Cardiac murmur, unspecified: Secondary | ICD-10-CM | POA: Diagnosis not present

## 2017-11-18 DIAGNOSIS — R569 Unspecified convulsions: Secondary | ICD-10-CM | POA: Diagnosis not present

## 2017-11-18 LAB — POCT INR: INR: 4.3

## 2017-11-18 NOTE — Patient Instructions (Signed)
Do not take Coumadin today, or tomorrow- Take Coumadin 2.5mg  on Saturday, Sunday, Coumadin 5mg  on Monday, and Coumadin 2.5mg  Tuesday through Thursday. Recheck Thursday.

## 2017-11-18 NOTE — Progress Notes (Addendum)
Pre visit review using our clinic review tool, if applicable. No additional management support is needed unless otherwise documented below in the visit note.  Pt here today with her daughter [name] for INR check. Pt negative findings.   Last INR: 2.9. Goal: 2.0-3.0.   Pt currently taking Coumadin 2.5mg  daily except 5mg  on Monday and Thursdays.   INR today: 4.3.   Per Percell Miller: Skip today, tomorrow- Take 2.5mg  on Saturday, Sunday, 5mg  on Monday, and 2.5mg  Tuesday through Thursday. Recheck Thursday.   Pt and [name] verbalized understanding.   This is the advise I gave after reviewing history with CMA.  Mackie Pai, PA-C

## 2017-11-19 ENCOUNTER — Telehealth: Payer: Self-pay

## 2017-11-19 ENCOUNTER — Encounter: Payer: Self-pay | Admitting: Cardiovascular Disease

## 2017-11-19 LAB — CBC
HEMOGLOBIN: 12.5 g/dL (ref 11.1–15.9)
Hematocrit: 36.2 % (ref 34.0–46.6)
MCH: 30.3 pg (ref 26.6–33.0)
MCHC: 34.5 g/dL (ref 31.5–35.7)
MCV: 88 fL (ref 79–97)
Platelets: 77 10*3/uL — CL (ref 150–379)
RBC: 4.12 x10E6/uL (ref 3.77–5.28)
RDW: 15.7 % — ABNORMAL HIGH (ref 12.3–15.4)
WBC: 5.4 10*3/uL (ref 3.4–10.8)

## 2017-11-19 LAB — BASIC METABOLIC PANEL
BUN/Creatinine Ratio: 17 (ref 12–28)
BUN: 22 mg/dL (ref 8–27)
CO2: 20 mmol/L (ref 20–29)
CREATININE: 1.32 mg/dL — AB (ref 0.57–1.00)
Calcium: 9.1 mg/dL (ref 8.7–10.3)
Chloride: 113 mmol/L — ABNORMAL HIGH (ref 96–106)
GFR calc Af Amer: 42 mL/min/{1.73_m2} — ABNORMAL LOW (ref >59)
GFR calc non Af Amer: 36 mL/min/{1.73_m2} — ABNORMAL LOW (ref >59)
GLUCOSE: 117 mg/dL — AB (ref 65–99)
POTASSIUM: 4 mmol/L (ref 3.5–5.2)
SODIUM: 148 mmol/L — AB (ref 134–144)

## 2017-11-19 LAB — PRO B NATRIURETIC PEPTIDE: NT-PRO BNP: 1330 pg/mL — AB (ref 0–738)

## 2017-11-19 NOTE — Telephone Encounter (Signed)
Spoke with pt daughter and explained to her the lasix changes over the next 2 days and she reported feeling upset that they have not been able to see Dr. Angelena Form. They were suppose to come in next week to see Gerrianne Scale NP on Tuesday, BMP on Monday and seeing Dr. Larose Kells in [facility] on Thursday for INR. She says it is very difficult to get the patient in and out for her appointments, so after reviewing her chart, we were able to get her in with Dr. Angelena Form on Monday 11/22/17, canceled her appointment on Tuesday 11/23/17. Pt daughter agreed and verbalized understanding of lasix changes over that weekend per Cecilie Kicks labs.

## 2017-11-22 ENCOUNTER — Ambulatory Visit (INDEPENDENT_AMBULATORY_CARE_PROVIDER_SITE_OTHER): Payer: Medicare Other | Admitting: Cardiovascular Disease

## 2017-11-22 ENCOUNTER — Encounter: Payer: Self-pay | Admitting: Cardiovascular Disease

## 2017-11-22 VITALS — BP 136/70 | HR 100 | Ht 65.0 in | Wt 187.6 lb

## 2017-11-22 DIAGNOSIS — R0609 Other forms of dyspnea: Secondary | ICD-10-CM | POA: Diagnosis not present

## 2017-11-22 DIAGNOSIS — I481 Persistent atrial fibrillation: Secondary | ICD-10-CM

## 2017-11-22 DIAGNOSIS — I5032 Chronic diastolic (congestive) heart failure: Secondary | ICD-10-CM

## 2017-11-22 DIAGNOSIS — I4819 Other persistent atrial fibrillation: Secondary | ICD-10-CM

## 2017-11-22 MED ORDER — FUROSEMIDE 40 MG PO TABS: 40.0000 mg | ORAL_TABLET | Freq: Every day | ORAL | 3 refills | Status: DC

## 2017-11-22 NOTE — Patient Instructions (Signed)
Medication Instructions:  Your physician recommends that you continue on your current medications as directed. Please refer to the Current Medication list given to you today.   Labwork: Lab work to be done today--BMP  Testing/Procedures: none  Follow-Up: Your physician recommends that you schedule a follow-up appointment in: 4-6 weeks with B. Rosita Fire, PA    Any Other Special Instructions Will Be Listed Below (If Applicable).     If you need a refill on your cardiac medications before your next appointment, please call your pharmacy.

## 2017-11-22 NOTE — Progress Notes (Signed)
Chief Complaint  Patient presents with  . Follow-up    Diastolic CHF   History of Present Illness: 84 yo female with history of paroxysmal atrial fibrillation, moderate LVH, mild MR, anemia, DVT/PE s/p IVC filter, colon cancer s/p colectomy here today for follow up. I saw her as a new patient for evaluation of irregular heart rhythm in February 2013. She had palpitations starting in 2011 and was found to be anemic at that time and then her colon cancer was found. This was complicated by DVT and PE. She had an IVC filter placed in December 2011. She was started on coumadin back in 2006 after a traumatic hip injury and this was restarted in October of 2011. She has had no bleeding issues since then. Echo 10/07/11 with normal LV function, mild LVH, mild MR, severe biatrial enlargement, PA pressure 54-56mmHg. She had a CVA in May 2016 and was admitted to [facility]. She completely recovered from her CVA. Her INR was therapeutic at the time. Echo May 2016 at [facility] with normal LV function, severe TR, mild to moderate MR, severe biatrial enlargement. Carotid dopplers at [facility] with no evidence of carotid disease. She was admitted to [facility] February 2017 with elevated INR, epistaxis. She was seen in our office 11/04/17 by Lyda Jester, PA and had c/o dyspnea but denied LE edema or PND. Her HR was elevated at 100 bpm. Her Lopressor was increased to 37.5 mg BID. Echo 11/18/17 with LVEF=65-70%, moderate TR, estimated RSVP 90 mmHg. BNP 1330. Her lasix was increased after he echo.   She is here today for follow up. The patient denies any chest pain, dyspnea, palpitations, lower extremity edema, orthopnea, PND, dizziness, near syncope or syncope. She feels much better since her Lasix was increased. She took 40 BID for several days and now is on Lasix 40 mg daily. She had been on 40 mg every other day. Her LE edema is completely resolved and breathing is back to baseline. She is  appreciative of the excellent care provided by B. Rosita Fire, PA-C at last visit.   Primary Care Physician: Colon Branch, MD  Past Medical History:  Diagnosis Date  . Acute encephalopathy   . Acute renal failure superimposed on stage 3 chronic kidney disease ([place])   . Anemia   . Atrial fibrillation ([place])   . Colon cancer ([place]) 12/11  . Colon cancer ([place]) 2011  . Diarrhea   . DVT (deep venous thrombosis) ([place]) 11/06  . Elevated brain natriuretic peptide (BNP) level   . Frequent headaches   . Heart murmur   . Hyperkalemia   . Hypertension   . Pulmonary embolism ([place]) 11/06  . Seizures ([place])   . Stroke [facility]) 11/30/2014    Past Surgical History:  Procedure Laterality Date  . APPENDECTOMY  1952  . COLECTOMY  06/2010   partial, Dr. Donne Hazel complicated by LLE CVT; S/P IVC umbrella & anemia  . HIP FRACTURE SURGERY  2006   Trauma  . TOTAL ABDOMINAL HYSTERECTOMY W/ BILATERAL SALPINGOOPHORECTOMY  1973   For Fibroids  . TOTAL HIP ARTHROPLASTY  01/03/07   Left hip replacement.  Marland Kitchen VENA CAVA FILTER PLACEMENT  06/28/2010    Current Outpatient Medications  Medication Sig Dispense Refill  . acetaminophen (TYLENOL) 500 MG tablet Take 500 mg by mouth every 8 (eight) hours as needed for mild pain.     Marland Kitchen albuterol (PROVENTIL HFA;VENTOLIN HFA) 108 (90 Base) MCG/ACT inhaler Inhale 2 puffs into the lungs  every 6 (six) hours as needed for wheezing or shortness of breath. 18 g 5  . albuterol (PROVENTIL) (2.5 MG/3ML) 0.083% nebulizer solution Take 3 mLs (2.5 mg total) by nebulization 3 (three) times daily as needed for wheezing or shortness of breath. 150 mL 5  . aspirin 81 MG tablet Take 81 mg by mouth daily.    Marland Kitchen atorvastatin (LIPITOR) 20 MG tablet Take 1 tablet (20 mg total) by mouth daily. 90 tablet 2  . Calcium Carbonate-Vit D-Min (CALCIUM 1200 PO) Take 2 tablets by mouth daily.     . Fluticasone-Salmeterol (ADVAIR DISKUS) 100-50 MCG/DOSE AEPB Inhale 1 puff into the lungs 2 (two) times daily. 60  each 5  . furosemide (LASIX) 40 MG tablet Take 1 tablet (40 mg total) by mouth daily. 90 tablet 3  . loperamide (IMODIUM A-D) 2 MG tablet Give 2 mg by mouth with first loose stool. Give 2 mg by mouth for additional loose stool.    Marland Kitchen loratadine (CLARITIN) 10 MG tablet Take 10 mg by mouth daily.    . metoprolol tartrate (LOPRESSOR) 25 MG tablet Take 1.5 tablets (37.5 mg total) by mouth 2 (two) times daily. 90 tablet 3  . Multiple Vitamins-Minerals (MULTIPLE VITAMINS/WOMENS PO) Take by mouth daily.    . promethazine (PHENERGAN) 12.5 MG tablet Take 1 tablet (12.5 mg total) by mouth 3 (three) times daily as needed for nausea or vomiting. 30 tablet 1  . traMADol (ULTRAM) 50 MG tablet Take 1 tablet (50 mg total) by mouth every 6 (six) hours as needed for moderate pain. 30 tablet 1  . verapamil (CALAN) 120 MG tablet Take 1 tablet (120 mg total) by mouth 2 (two) times daily. 180 tablet 1  . warfarin (COUMADIN) 2.5 MG tablet USE AS DIRECTED BY COUMADIN CLINIC 90 tablet 0   No current facility-administered medications for this visit.     No Known Allergies  Social History   Socioeconomic History  . Marital status: Widowed    Spouse name: Not on file  . Number of children: 1  . Years of education: Not on file  . Highest education level: Not on file  Occupational History  . Occupation: n/d    Employer: RETIRED  Social Needs  . Financial resource strain: Not on file  . Food insecurity:    Worry: Not on file    Inability: Not on file  . Transportation needs:    Medical: Not on file    Non-medical: Not on file  Tobacco Use  . Smoking status: Passive Smoke Exposure - Never Smoker  . Smokeless tobacco: Never Used  . Tobacco comment: husband smoked in home.   Substance and Sexual Activity  . Alcohol use: No    Alcohol/week: 0.0 oz  . Drug use: No  . Sexual activity: Not on file  Lifestyle  . Physical activity:    Days per week: Not on file    Minutes per session: Not on file  . Stress:  Not on file  Relationships  . Social connections:    Talks on phone: Not on file    Gets together: Not on file    Attends religious service: Not on file    Active member of club or organization: Not on file    Attends meetings of clubs or organizations: Not on file    Relationship status: Not on file  . Intimate partner violence:    Fear of current or ex partner: Not on file    Emotionally abused:  Not on file    Physically abused: Not on file    Forced sexual activity: Not on file  Other Topics Concern  . Not on file  Social History Narrative   Lives w/ her daughter     Family History  Problem Relation Age of Onset  . Peripheral vascular disease Father   . Heart attack Mother 14  . Heart disease Mother        MI  . Coronary artery disease Sister   . Diabetes Paternal Grandmother   . Coronary artery disease Maternal Grandfather     Review of Systems:  As stated in the HPI and otherwise negative.   BP 136/70   Pulse 100   Ht 5\' 5"  (1.651 m)   Wt 187 lb 9.6 oz (85.1 kg)   SpO2 93%   BMI 31.22 kg/m   Physical Examination:  General: Well developed, well nourished, NAD  HEENT: OP clear, mucus membranes moist  SKIN: warm, dry. No rashes. Neuro: No focal deficits  Musculoskeletal: Muscle strength 5/5 all ext  Psychiatric: Mood and affect normal  Neck: No JVD, no carotid bruits, no thyromegaly, no lymphadenopathy.  Lungs:Clear bilaterally, no wheezes, rhonci, crackles Cardiovascular: Irregular irregular. Systolic murmur.  Abdomen:Soft. Bowel sounds present. Non-tender.  Extremities: No lower extremity edema. Pulses are 2 + in the bilateral DP/PT.  Echo May 2019: - Left ventricle: The cavity size was normal. Wall thickness was   increased in a pattern of moderate LVH. Systolic function was   vigorous. The estimated ejection fraction was in the range of 65%   to 70%. Wall motion was normal; there were no regional wall   motion abnormalities. - Left atrium: The atrium  was severely dilated. - Right atrium: The atrium was severely dilated. - Tricuspid valve: There was moderate regurgitation. - Pulmonary arteries: Systolic pressure was severely increased. PA   peak pressure: 90 mm Hg (S).  EKG:  EKG is not ordered today. The ekg ordered today demonstrates    Recent Labs: 05/04/2017: ALT 11 11/18/2017: BUN 22; Creatinine, Ser 1.32; Hemoglobin 12.5; NT-Pro BNP 1,330; Platelets 77; Potassium 4.0; Sodium 148   Lipid Panel    Component Value Date/Time   CHOL 97 08/16/2017 1408   TRIG 56.0 08/16/2017 1408   HDL 37.70 (L) 08/16/2017 1408   CHOLHDL 3 08/16/2017 1408   VLDL 11.2 08/16/2017 1408   LDLCALC 48 08/16/2017 1408     Wt Readings from Last 3 Encounters:  11/22/17 187 lb 9.6 oz (85.1 kg)  11/04/17 188 lb (85.3 kg)  08/16/17 187 lb 4 oz (84.9 kg)     Other studies Reviewed: Additional studies/ records that were reviewed today include: . Review of the above records demonstrates:    Assessment and Plan:   1. ATRIAL FIBRILLATION, permanent: Her heart rate is controlled on the beta blocker. Will continue coumadin. She has not wished to consider a DOAC in the past.    2. Mitral regurgitation: Trivial regurgitation by echo 11/18/17  3. History of CVA: May 2016. No residual deficits. Her INR was therapeutic at that time. Per pt, neurology added ASA and statin.   4. Chronic diastolic dysfunction: Her volume status is much improved on Lasix 40 mg daily. Will continue this dose. BMET today. Follow up 4-6 weeks with Lyda Jester, PA-C.   Current medicines are reviewed at length with the patient today.  The patient does not have concerns regarding medicines.  The following changes have been made:  no change  Labs/ tests ordered today include:   Orders Placed This Encounter  Procedures  . Basic Metabolic Panel (BMET)    Disposition:   FU with the Care team in our office in 4-6 weeks. She has seen B Rosita Fire, PA-C recently.    Signed, Lauree Chandler, MD 11/22/2017 4:54 PM    [facility] [place], [place], [place]  [address] Phone: [phone]; Fax: [phone]

## 2017-11-23 ENCOUNTER — Ambulatory Visit: Payer: Medicare Other | Admitting: Physician Assistant

## 2017-11-23 ENCOUNTER — Other Ambulatory Visit: Payer: Self-pay

## 2017-11-23 DIAGNOSIS — E876 Hypokalemia: Secondary | ICD-10-CM

## 2017-11-23 LAB — BASIC METABOLIC PANEL
BUN/Creatinine Ratio: 14 (ref 12–28)
BUN: 20 mg/dL (ref 8–27)
CALCIUM: 10.1 mg/dL (ref 8.7–10.3)
CO2: 22 mmol/L (ref 20–29)
Chloride: 107 mmol/L — ABNORMAL HIGH (ref 96–106)
Creatinine, Ser: 1.41 mg/dL — ABNORMAL HIGH (ref 0.57–1.00)
GFR calc Af Amer: 38 mL/min/{1.73_m2} — ABNORMAL LOW (ref >59)
GFR calc non Af Amer: 33 mL/min/{1.73_m2} — ABNORMAL LOW (ref >59)
GLUCOSE: 109 mg/dL — AB (ref 65–99)
Potassium: 4 mmol/L (ref 3.5–5.2)
Sodium: 148 mmol/L — ABNORMAL HIGH (ref 134–144)

## 2017-11-25 ENCOUNTER — Ambulatory Visit (INDEPENDENT_AMBULATORY_CARE_PROVIDER_SITE_OTHER): Payer: Medicare Other

## 2017-11-25 DIAGNOSIS — Z7901 Long term (current) use of anticoagulants: Secondary | ICD-10-CM | POA: Diagnosis not present

## 2017-11-25 LAB — POCT INR: INR: 2.1 (ref 2.0–3.0)

## 2017-11-25 NOTE — Progress Notes (Addendum)
Pre visit review using our clinic review tool, if applicable. No additional management support is needed unless otherwise documented below in the visit note.  Pt here today for INR check.   Last INR: 4.3. Goal is 2.0 to 3.0.   Pt was informed to skip dosage for 5/16, 5/17 and restart 2.5mg  on Saturday 5/18 and Sunday 5/19 and 5mg  on Monday, 5/19 and 2.5mg  on Tuesday 5/20 through Thursday 5/23.   INR today: 2.1.  Per Dr. Larose Kells- start Coumadin 2.5mg  daily except 5mg  on Wednesdays- recheck INR in 2 weeks.   Kathlene November, MD

## 2017-11-25 NOTE — Patient Instructions (Signed)
Start Coumadin 2.5mg  daily except 5mg  on Wednesdays- recheck in 2 weeks.

## 2017-11-30 ENCOUNTER — Other Ambulatory Visit: Payer: Medicare Other

## 2017-12-01 ENCOUNTER — Other Ambulatory Visit: Payer: Medicare Other | Admitting: *Deleted

## 2017-12-01 DIAGNOSIS — E876 Hypokalemia: Secondary | ICD-10-CM | POA: Diagnosis not present

## 2017-12-02 LAB — BASIC METABOLIC PANEL
BUN/Creatinine Ratio: 21 (ref 12–28)
BUN: 30 mg/dL — ABNORMAL HIGH (ref 8–27)
CO2: 26 mmol/L (ref 20–29)
Calcium: 9.5 mg/dL (ref 8.7–10.3)
Chloride: 105 mmol/L (ref 96–106)
Creatinine, Ser: 1.4 mg/dL — ABNORMAL HIGH (ref 0.57–1.00)
GFR calc non Af Amer: 33 mL/min/{1.73_m2} — ABNORMAL LOW (ref >59)
GFR, EST AFRICAN AMERICAN: 38 mL/min/{1.73_m2} — AB (ref >59)
Glucose: 97 mg/dL (ref 65–99)
POTASSIUM: 4.3 mmol/L (ref 3.5–5.2)
SODIUM: 147 mmol/L — AB (ref 134–144)

## 2017-12-06 ENCOUNTER — Other Ambulatory Visit: Payer: Self-pay

## 2017-12-06 MED ORDER — WARFARIN SODIUM 2.5 MG PO TABS: ORAL_TABLET | ORAL | 0 refills | Status: DC

## 2017-12-09 ENCOUNTER — Ambulatory Visit: Payer: Medicare Other

## 2017-12-10 ENCOUNTER — Ambulatory Visit: Payer: Medicare Other

## 2017-12-10 ENCOUNTER — Telehealth: Payer: Self-pay

## 2017-12-10 NOTE — Telephone Encounter (Signed)
Copied from [place] [id number]. Topic: Quick Communication - Appointment Cancellation >> Dec 10, 2017  9:20 AM Yvette Rack wrote: Patient called to cancel appointment scheduled for 12-10-17. Patient has rescheduled their appointment.  Route to department's PEC pool.

## 2017-12-13 NOTE — Telephone Encounter (Signed)
noted 

## 2017-12-15 ENCOUNTER — Ambulatory Visit (INDEPENDENT_AMBULATORY_CARE_PROVIDER_SITE_OTHER): Payer: Medicare Other | Admitting: *Deleted

## 2017-12-15 DIAGNOSIS — I4891 Unspecified atrial fibrillation: Secondary | ICD-10-CM | POA: Diagnosis not present

## 2017-12-15 LAB — POCT INR: INR: 2.2 (ref 2.0–3.0)

## 2017-12-15 NOTE — Progress Notes (Addendum)
Pt here for INR check per Dr Larose Kells.  Goal INR = 2.0 - 3.0   Last INR = 2.1  Pt currently takes Coumadin 2.5mg  daily except to take 5mg  on Mondays.  Pt denies recent antibiotics, no dietary changes and no unusual bruising / bleeding.  INR today = 2.2  Pt advised per Dr Larose Kells to continue current dose of Coumadin 2.5mg  daily except to take 5mg  on Mondays and return as scheduled with him on 01/18/18 and will do INR check at that visit. Kathlene November, MD

## 2018-01-02 ENCOUNTER — Other Ambulatory Visit: Payer: Self-pay | Admitting: Internal Medicine

## 2018-01-13 ENCOUNTER — Encounter: Payer: Self-pay | Admitting: Cardiology

## 2018-01-13 ENCOUNTER — Ambulatory Visit (INDEPENDENT_AMBULATORY_CARE_PROVIDER_SITE_OTHER): Payer: Medicare Other | Admitting: Cardiology

## 2018-01-13 VITALS — BP 164/84 | HR 51 | Ht 65.0 in | Wt 198.0 lb

## 2018-01-13 DIAGNOSIS — R0602 Shortness of breath: Secondary | ICD-10-CM

## 2018-01-13 DIAGNOSIS — R300 Dysuria: Secondary | ICD-10-CM | POA: Diagnosis not present

## 2018-01-13 NOTE — Patient Instructions (Addendum)
Medication Instructions:  Your physician recommends that you continue on your current medications as directed. Please refer to the Current Medication list given to you today.   Labwork: TODAY U/A, CBC, CMET, PRO BNP  Testing/Procedures: NONE ORDERED TODAY  Follow-Up: DR. Angelena Form ON 04/20/18 @ 2:40 PM   Any Other Special Instructions Will Be Listed Below (If Applicable). BEFORE TAKING METOPROLOL TONIGHT MAKE SURE TO CHECK BLOOD PRESSURE AND HEART RATE; DO NOT TAKE THE METOPROLOL IF SYSTOLIC BP (TOP NUMBER) IS BELOW 100 OR IF HEART RATE IS BELOW 60.    If you need a refill on your cardiac medications before your next appointment, please call your pharmacy.

## 2018-01-13 NOTE — Progress Notes (Signed)
01/13/2018 [place]   Oct 23, 1926  [id number]  Primary Physician Colon Branch, MD Primary Cardiologist: Dr. Angelena Form  Reason for Visit/CC: f/u for diastolic CHF; new complaint of confusion   HPI:  [name] is a 84 y.o. female who is being seen today for f/u for diastolic HF. Also new complaint of confusion.   She is followed by Dr. Angelena Form and has a history of persistent atrial fibrillation, moderate LVH, mild MR, anemia, DVT/PE status post IVC filter as well as a history of colon cancer status post colectomy.  She is on chronic anticoagulation with Coumadin for her history of DVT/PE as well as for atrial fibrillation.  Her INRs are followed by her PCP. She lives at home with her daughter, who helps care for her. She is in a wheel chair.   I evaluated her on 11/04/17 and she had c/o dyspnea. Her HR was elevated at 100 bpm. Her Lopressor was increased to 37.5 mg BID. Echo 11/18/17 with LVEF=65-70%, moderate TR, estimated RSVP 90 mmHg. BNP was 1330. Her lasix was increased after he echo to 40 mg BID for several days.   She had f/u with Dr. Angelena Form on 11/22/17 and noted significant improvement in symptoms after increasing her Lasix to 40 mg BID for several days. Her LEE had completely resolved and breathing was back to baseline. Dr. Angelena Form recommended pt return back in 4-6 weeks for repeat f/u.  She is back today with her daughter. Her daughter is highly concerned due to a change in her baseline. She seems more confused today. Her daugther woke up and found that she had already entered into her pill box and there were "pills everywhere". Pt could not remember if she had taken her morning meds or not. Her daughter is unsure what she took. Pt denies fever and chills but has had some mild dysuria. BP is elevated in the 144R systolic. EKG shows rate controlled afib in the 70s. No CP. Volume appears stable. No edema and lungs are CTAB, however pt notes having mild dyspnea x 3 days.   Current  Meds  Medication Sig  . acetaminophen (TYLENOL) 500 MG tablet Take 500 mg by mouth every 8 (eight) hours as needed for mild pain.   Marland Kitchen ADVAIR DISKUS 100-50 MCG/DOSE AEPB Inhale 1 puff into the lungs 2 (two) times daily.  Marland Kitchen albuterol (PROVENTIL HFA;VENTOLIN HFA) 108 (90 Base) MCG/ACT inhaler Inhale 2 puffs into the lungs every 6 (six) hours as needed for wheezing or shortness of breath.  Marland Kitchen albuterol (PROVENTIL) (2.5 MG/3ML) 0.083% nebulizer solution Take 3 mLs (2.5 mg total) by nebulization 3 (three) times daily as needed for wheezing or shortness of breath.  Marland Kitchen aspirin 81 MG tablet Take 81 mg by mouth daily.  Marland Kitchen atorvastatin (LIPITOR) 20 MG tablet Take 1 tablet (20 mg total) by mouth daily.  . Calcium Carbonate-Vit D-Min (CALCIUM 1200 PO) Take 2 tablets by mouth daily.   . furosemide (LASIX) 40 MG tablet Take 1 tablet (40 mg total) by mouth daily.  Marland Kitchen loperamide (IMODIUM A-D) 2 MG tablet Give 2 mg by mouth with first loose stool. Give 2 mg by mouth for additional loose stool.  Marland Kitchen loratadine (CLARITIN) 10 MG tablet Take 10 mg by mouth daily.  . metoprolol tartrate (LOPRESSOR) 25 MG tablet Take 1.5 tablets (37.5 mg total) by mouth 2 (two) times daily.  . Multiple Vitamins-Minerals (MULTIPLE VITAMINS/WOMENS PO) Take by mouth daily.  . promethazine (PHENERGAN) 12.5 MG tablet Take 1  tablet (12.5 mg total) by mouth 3 (three) times daily as needed for nausea or vomiting.  . traMADol (ULTRAM) 50 MG tablet Take 1 tablet (50 mg total) by mouth every 6 (six) hours as needed for moderate pain.  . verapamil (CALAN) 120 MG tablet Take 1 tablet (120 mg total) by mouth 2 (two) times daily.  Marland Kitchen warfarin (COUMADIN) 2.5 MG tablet USE AS DIRECTED BY COUMADIN CLINIC   No Known Allergies Past Medical History:  Diagnosis Date  . Acute encephalopathy   . Acute renal failure superimposed on stage 3 chronic kidney disease ([place])   . Anemia   . Atrial fibrillation ([place])   . Colon cancer ([place]) 12/11  . Colon cancer ([place])  2011  . Diarrhea   . DVT (deep venous thrombosis) ([place]) 11/06  . Elevated brain natriuretic peptide (BNP) level   . Frequent headaches   . Heart murmur   . Hyperkalemia   . Hypertension   . Pulmonary embolism ([place]) 11/06  . Seizures ([place])   . Stroke [facility]) 11/30/2014   Family History  Problem Relation Age of Onset  . Peripheral vascular disease Father   . Heart attack Mother 63  . Heart disease Mother        MI  . Coronary artery disease Sister   . Diabetes Paternal Grandmother   . Coronary artery disease Maternal Grandfather    Past Surgical History:  Procedure Laterality Date  . APPENDECTOMY  1952  . COLECTOMY  06/2010   partial, Dr. Donne Hazel complicated by LLE CVT; S/P IVC umbrella & anemia  . HIP FRACTURE SURGERY  2006   Trauma  . TOTAL ABDOMINAL HYSTERECTOMY W/ BILATERAL SALPINGOOPHORECTOMY  1973   For Fibroids  . TOTAL HIP ARTHROPLASTY  01/03/07   Left hip replacement.  Marland Kitchen VENA CAVA FILTER PLACEMENT  06/28/2010   Social History   Socioeconomic History  . Marital status: Widowed    Spouse name: Not on file  . Number of children: 1  . Years of education: Not on file  . Highest education level: Not on file  Occupational History  . Occupation: n/d    Employer: RETIRED  Social Needs  . Financial resource strain: Not on file  . Food insecurity:    Worry: Not on file    Inability: Not on file  . Transportation needs:    Medical: Not on file    Non-medical: Not on file  Tobacco Use  . Smoking status: Passive Smoke Exposure - Never Smoker  . Smokeless tobacco: Never Used  . Tobacco comment: husband smoked in home.   Substance and Sexual Activity  . Alcohol use: No    Alcohol/week: 0.0 oz  . Drug use: No  . Sexual activity: Not on file  Lifestyle  . Physical activity:    Days per week: Not on file    Minutes per session: Not on file  . Stress: Not on file  Relationships  . Social connections:    Talks on phone: Not on file    Gets together: Not on file      Attends religious service: Not on file    Active member of club or organization: Not on file    Attends meetings of clubs or organizations: Not on file    Relationship status: Not on file  . Intimate partner violence:    Fear of current or ex partner: Not on file    Emotionally abused: Not on file    Physically abused: Not on  file    Forced sexual activity: Not on file  Other Topics Concern  . Not on file  Social History Narrative   Lives w/ her daughter      Review of Systems: General: negative for chills, fever, night sweats or weight changes.  Cardiovascular: negative for chest pain, dyspnea on exertion, edema, orthopnea, palpitations, paroxysmal nocturnal dyspnea or shortness of breath Dermatological: negative for rash Respiratory: negative for cough or wheezing Urologic: negative for hematuria Abdominal: negative for nausea, vomiting, diarrhea, bright red blood per rectum, melena, or hematemesis Neurologic: negative for visual changes, syncope, or dizziness All other systems reviewed and are otherwise negative except as noted above.   Physical Exam:  Blood pressure (!) 164/84, pulse (!) 51, height 5\' 5"  (1.651 m), weight 198 lb (89.8 kg), SpO2 99 %.  General appearance: alert, cooperative and no distress Neck: no carotid bruit and no JVD Lungs: clear to auscultation bilaterally Heart: regular rate and rhythm, S1, S2 normal, no murmur, click, rub or gallop Extremities: extremities normal, atraumatic, no cyanosis or edema Pulses: 2+ and symmetric Skin: Skin color, texture, turgor normal. No rashes or lesions Neurologic: Grossly normal  EKG afib rate controlled in the 70s -- personally reviewed   ASSESSMENT AND PLAN:   1.  Confusion: Her daughter is highly concerned due to a change in her baseline. She seems more confused today. Her daugther woke up and found that she had already entered into her pill box and there were "pills everywhere". Pt could not remember if she  had taken her morning meds or not. Her daughter is unsure what she took. Pt denies fever and chills but has had some mild dysuria. BP is elevated in the 300P systolic. EKG shows rate controlled afib in the 70s. No CP. Volume appears stable. No edema and lungs are CTAB, however pt notes having mild dyspnea x 3 days.   -- Will check UA to r/o UTI given confusion and mild dysuria.  -- Also ? Polypharmacy due to mix up with meds this morning. Luckily she is not hypotensive. My neuro exam unremarkable. The only BP med she is scheduled to take this evening is metoprolol. Daughter was given instruction to check BP and pulse rate this evening. Only plan to hold metoprolol if SBP is <100 or if HR is <60 bpm. Otherwise plan to resume normal med schedule. Daughter will plan to administer meds to pt from her on out.   -- Will check basic labs including CBC and CMP. ? WBC, renal function and electrolytes abnormalities  -- Call PCP tomorrow if still confused.   2. Chronic Diastolic HF: volume appears stable but pt notes recent dyspnea. No respiratory distress. Normal work of breathing. Lung exam ok. Will check BNP today. Will adjust lasix if needed.    3. Chronic Afib: rate is controlled. On coumadin. INR followed by PCP.   4. HTN: per above, elevated but some meds unaccounted for this morning as outlined above. Unsure if pt actually took meds this morning.  Pt to resume regular med schedule tonight/ tomorrow. Daughter will help ambisinister meds and will monitor BP at home.    Follow-Up in 2-3 months. She has f/u with PCP early next week.   Quamesha Mullet Ladoris Gene, MHS [facility] 01/13/2018 3:32 PM

## 2018-01-14 LAB — COMPREHENSIVE METABOLIC PANEL
A/G RATIO: 2 (ref 1.2–2.2)
ALK PHOS: 49 IU/L (ref 39–117)
ALT: 17 IU/L (ref 0–32)
AST: 25 IU/L (ref 0–40)
Albumin: 4.6 g/dL (ref 3.5–4.7)
BILIRUBIN TOTAL: 1.1 mg/dL (ref 0.0–1.2)
BUN/Creatinine Ratio: 24 (ref 12–28)
BUN: 46 mg/dL — ABNORMAL HIGH (ref 8–27)
CHLORIDE: 103 mmol/L (ref 96–106)
CO2: 23 mmol/L (ref 20–29)
Calcium: 10.1 mg/dL (ref 8.7–10.3)
Creatinine, Ser: 1.92 mg/dL — ABNORMAL HIGH (ref 0.57–1.00)
GFR calc Af Amer: 26 mL/min/{1.73_m2} — ABNORMAL LOW (ref >59)
GFR calc non Af Amer: 23 mL/min/{1.73_m2} — ABNORMAL LOW (ref >59)
GLOBULIN, TOTAL: 2.3 g/dL (ref 1.5–4.5)
Glucose: 104 mg/dL — ABNORMAL HIGH (ref 65–99)
POTASSIUM: 4.1 mmol/L (ref 3.5–5.2)
SODIUM: 146 mmol/L — AB (ref 134–144)
Total Protein: 6.9 g/dL (ref 6.0–8.5)

## 2018-01-14 LAB — CBC
HEMATOCRIT: 42.8 % (ref 34.0–46.6)
Hemoglobin: 14.5 g/dL (ref 11.1–15.9)
MCH: 30.5 pg (ref 26.6–33.0)
MCHC: 33.9 g/dL (ref 31.5–35.7)
MCV: 90 fL (ref 79–97)
Platelets: 107 10*3/uL — ABNORMAL LOW (ref 150–450)
RBC: 4.75 x10E6/uL (ref 3.77–5.28)
RDW: 15 % (ref 12.3–15.4)
WBC: 5.7 10*3/uL (ref 3.4–10.8)

## 2018-01-14 LAB — URINALYSIS
BILIRUBIN UA: NEGATIVE
GLUCOSE, UA: NEGATIVE
Ketones, UA: NEGATIVE
Nitrite, UA: NEGATIVE
PROTEIN UA: NEGATIVE
RBC, UA: NEGATIVE
Specific Gravity, UA: 1.015 (ref 1.005–1.030)
Urobilinogen, Ur: 0.2 mg/dL (ref 0.2–1.0)
pH, UA: 5.5 (ref 5.0–7.5)

## 2018-01-14 LAB — PRO B NATRIURETIC PEPTIDE: NT-PRO BNP: 821 pg/mL — AB (ref 0–738)

## 2018-01-18 ENCOUNTER — Ambulatory Visit (INDEPENDENT_AMBULATORY_CARE_PROVIDER_SITE_OTHER): Payer: Medicare Other | Admitting: Internal Medicine

## 2018-01-18 ENCOUNTER — Encounter: Payer: Self-pay | Admitting: Internal Medicine

## 2018-01-18 VITALS — BP 133/52 | HR 61 | Temp 97.8°F | Resp 16 | Ht 65.0 in | Wt 198.0 lb

## 2018-01-18 DIAGNOSIS — R410 Disorientation, unspecified: Secondary | ICD-10-CM

## 2018-01-18 DIAGNOSIS — Z7901 Long term (current) use of anticoagulants: Secondary | ICD-10-CM

## 2018-01-18 DIAGNOSIS — D696 Thrombocytopenia, unspecified: Secondary | ICD-10-CM

## 2018-01-18 DIAGNOSIS — M5136 Other intervertebral disc degeneration, lumbar region: Secondary | ICD-10-CM | POA: Diagnosis not present

## 2018-01-18 DIAGNOSIS — I10 Essential (primary) hypertension: Secondary | ICD-10-CM

## 2018-01-18 DIAGNOSIS — I482 Chronic atrial fibrillation, unspecified: Secondary | ICD-10-CM

## 2018-01-18 LAB — BASIC METABOLIC PANEL
BUN: 37 mg/dL — AB (ref 6–23)
CHLORIDE: 105 meq/L (ref 96–112)
CO2: 31 mEq/L (ref 19–32)
Calcium: 9.7 mg/dL (ref 8.4–10.5)
Creatinine, Ser: 1.49 mg/dL — ABNORMAL HIGH (ref 0.40–1.20)
GFR: 35.01 mL/min — ABNORMAL LOW (ref >60.00)
GLUCOSE: 90 mg/dL (ref 70–99)
POTASSIUM: 3.4 meq/L — AB (ref 3.5–5.1)
Sodium: 145 mEq/L (ref 135–145)

## 2018-01-18 LAB — POCT INR: INR: 3.3 — AB (ref 2.0–3.0)

## 2018-01-18 NOTE — Patient Instructions (Signed)
GO TO THE LAB : Get the blood work     GO TO THE FRONT DESK Schedule your next appointment for a Coumadin check in 2 weeks, continue the same doses  Next visit with me in 3 months, please schedule.

## 2018-01-18 NOTE — Progress Notes (Signed)
Pre visit review using our clinic review tool, if applicable. No additional management support is needed unless otherwise documented below in the visit note. 

## 2018-01-18 NOTE — Progress Notes (Signed)
Subjective:    Patient ID: [name], female    DOB: 1926-07-31, 84 y.o.   MRN: [id number]  DOS:  01/18/2018 Type of visit - description : rov Interval history: Here with her daughter. Her main concern is confusion, the patient was slightly confused throughout last week , went to see cardiology, they did a urine culture and it was negative, creatinine was elevated. The daughter also noted that is likely the patient has not been taking her medication correctly however she is already taking care of that problem.  She also suspect that the confusion was probably due to not taking her medication correctly. She had a stroke in the past but this time other than the speech being slightly slow the confusion was not associated with other symptoms HTN: BP today is very good. Coumadin check: INR today slightly elevated CHF: Saw cardiology few days ago, felt to be stable from the cardiac standpoint. Asthma: Well-controlled on Advair  BP Readings from Last 3 Encounters:  01/18/18 (!) 133/52  01/13/18 (!) 164/84  11/22/17 136/70    Review of Systems No fever chills Prior to being confused she had a regular headache, not the worst of her life and not an  Unusual HA.  She did take some Ultram then. No facial weakness or numbness.  No focal deficit, no LOC. Denies any dysuria or gross hematuria.  Past Medical History:  Diagnosis Date  . Acute encephalopathy   . Acute renal failure superimposed on stage 3 chronic kidney disease ([place])   . Anemia   . Atrial fibrillation ([place])   . Colon cancer ([place]) 12/11  . Colon cancer ([place]) 2011  . Diarrhea   . DVT (deep venous thrombosis) ([place]) 11/06  . Elevated brain natriuretic peptide (BNP) level   . Frequent headaches   . Heart murmur   . Hyperkalemia   . Hypertension   . Pulmonary embolism ([place]) 11/06  . Seizures ([place])   . Stroke [facility] ) 11/30/2014    Past Surgical History:  Procedure Laterality Date  . APPENDECTOMY  1952  . COLECTOMY   06/2010   partial, Dr. Donne Hazel complicated by LLE CVT; S/P IVC umbrella & anemia  . HIP FRACTURE SURGERY  2006   Trauma  . TOTAL ABDOMINAL HYSTERECTOMY W/ BILATERAL SALPINGOOPHORECTOMY  1973   For Fibroids  . TOTAL HIP ARTHROPLASTY  01/03/07   Left hip replacement.  Marland Kitchen VENA CAVA FILTER PLACEMENT  06/28/2010    Social History   Socioeconomic History  . Marital status: Widowed    Spouse name: Not on file  . Number of children: 1  . Years of education: Not on file  . Highest education level: Not on file  Occupational History  . Occupation: n/d    Employer: RETIRED  Social Needs  . Financial resource strain: Not on file  . Food insecurity:    Worry: Not on file    Inability: Not on file  . Transportation needs:    Medical: Not on file    Non-medical: Not on file  Tobacco Use  . Smoking status: Passive Smoke Exposure - Never Smoker  . Smokeless tobacco: Never Used  . Tobacco comment: husband smoked in home.   Substance and Sexual Activity  . Alcohol use: No    Alcohol/week: 0.0 oz  . Drug use: No  . Sexual activity: Not Currently  Lifestyle  . Physical activity:    Days per week: Not on file    Minutes per session: Not on  file  . Stress: Not on file  Relationships  . Social connections:    Talks on phone: Not on file    Gets together: Not on file    Attends religious service: Not on file    Active member of club or organization: Not on file    Attends meetings of clubs or organizations: Not on file    Relationship status: Not on file  . Intimate partner violence:    Fear of current or ex partner: Not on file    Emotionally abused: Not on file    Physically abused: Not on file    Forced sexual activity: Not on file  Other Topics Concern  . Not on file  Social History Narrative   Lives w/ her daughter       Allergies as of 01/18/2018   No Known Allergies     Medication List        Accurate as of 01/18/18 11:59 PM. Always use your most recent med list.            acetaminophen 500 MG tablet Commonly known as:  TYLENOL Take 500 mg by mouth every 8 (eight) hours as needed for mild pain.   ADVAIR DISKUS 100-50 MCG/DOSE Aepb Generic drug:  Fluticasone-Salmeterol Inhale 1 puff into the lungs 2 (two) times daily.   albuterol (2.5 MG/3ML) 0.083% nebulizer solution Commonly known as:  PROVENTIL Take 3 mLs (2.5 mg total) by nebulization 3 (three) times daily as needed for wheezing or shortness of breath.   albuterol 108 (90 Base) MCG/ACT inhaler Commonly known as:  PROVENTIL HFA;VENTOLIN HFA Inhale 2 puffs into the lungs every 6 (six) hours as needed for wheezing or shortness of breath.   aspirin 81 MG tablet Take 81 mg by mouth daily.   atorvastatin 20 MG tablet Commonly known as:  LIPITOR Take 1 tablet (20 mg total) by mouth daily.   CALCIUM 1200 PO Take 2 tablets by mouth daily.   furosemide 40 MG tablet Commonly known as:  LASIX Take 1 tablet (40 mg total) by mouth daily.   loperamide 2 MG tablet Commonly known as:  IMODIUM A-D Give 2 mg by mouth with first loose stool. Give 2 mg by mouth for additional loose stool.   loratadine 10 MG tablet Commonly known as:  CLARITIN Take 10 mg by mouth daily.   metoprolol tartrate 25 MG tablet Commonly known as:  LOPRESSOR Take 1.5 tablets (37.5 mg total) by mouth 2 (two) times daily.   MULTIPLE VITAMINS/WOMENS PO Take by mouth daily.   promethazine 12.5 MG tablet Commonly known as:  PHENERGAN Take 1 tablet (12.5 mg total) by mouth 3 (three) times daily as needed for nausea or vomiting.   traMADol 50 MG tablet Commonly known as:  ULTRAM Take 1 tablet (50 mg total) by mouth every 6 (six) hours as needed for moderate pain.   verapamil 120 MG tablet Commonly known as:  CALAN Take 1 tablet (120 mg total) by mouth 2 (two) times daily.   warfarin 2.5 MG tablet Commonly known as:  COUMADIN Take as directed by the anticoagulation clinic. If you are unsure how to take this  medication, talk to your nurse or doctor. Original instructions:  USE AS DIRECTED BY COUMADIN CLINIC          Objective:   Physical Exam BP (!) 133/52   Pulse 61   Temp 97.8 F (36.6 C) (Oral)   Resp 16   Ht 5\' 5"  (1.651 m)  Wt 198 lb (89.8 kg)   SpO2 94%   BMI 32.95 kg/m  General:   Well developed, elderly lady, in no distress, sitting in a wheelchair. HEENT:  Normocephalic . Face symmetric, atraumatic Lungs:  CTA B Normal respiratory effort, no intercostal retractions, no accessory muscle use. Heart: irregular .  No pretibial edema bilaterally  Skin: Not pale. Not jaundice Neurologic:  alert & oriented X3.  Speech normal, gait not tested.  Face symmetric, motor symmetric. Psych--  Cognition and judgment appear intact.  Cooperative with normal attention span and concentration.  Behavior appropriate. No anxious or depressed appearing.      Assessment & Plan:   Assessment (transfer Dr Linna Darner [phone]) HTN Hyperlipidemia insomnia - xanax prn Asthma  Atrial fibrillation--rate controlled, anticoag Stroke 11-2014, no residual deficits (TIA?) Heart murmur: Mitral regurgitation DVT and pulmonary emboli 2006, vena cava filter 2011  MSK: tramadol qd  --DJD --Back pain has seen Dr Nelva Bush, dr Rolena Infante, MRI: djd, stenosis, med treatment Colon cancer 2011 -- Dr Annitta Jersey , d/c from oncology 06-2015 , No further colonoscopies  PLAN Confusion: Was confused last week, back to baseline, cardiology ordered a UCX: (-), the daughter reports that apparently she has not taken her medications correctly, that may account for her confusion.  Did not have stroke symptoms other than her speech being slow.  We talk about possibly doing a head CT, the patient does not really proceed with any further work-up. Agreed on observation. A. Fibrillation, Coumadin management: INR today is 3.3, inr  has been usually okay, recommend to recheck in 2 weeks without changing the dosing. HTN: Seems  controlled on Lasix, metoprolol, Calan.  Last creatinine slightly elevated, she was encouraged to increase hydration, recheck a BMP. Asthma: On Advair, will control DJD, back pain: She has some leftover Ultram but really does not take anything regularly.  No UDS or contract at this time. Thrombocytopenia: Mild, fluctuant, recheck every few months. Nausea: Very sporadically has nausea, has some Phenergan on hand. RTC 2 weeks INR RTC 3 months office visit

## 2018-01-19 NOTE — Assessment & Plan Note (Signed)
Confusion: Was confused last week, back to baseline, cardiology ordered a UCX: (-), the daughter reports that apparently she has not taken her medications correctly, that may account for her confusion.  Did not have stroke symptoms other than her speech being slow.  We talk about possibly doing a head CT, the patient does not really proceed with any further work-up. Agreed on observation. A. Fibrillation, Coumadin management: INR today is 3.3, inr  has been usually okay, recommend to recheck in 2 weeks without changing the dosing. HTN: Seems controlled on Lasix, metoprolol, Calan.  Last creatinine slightly elevated, she was encouraged to increase hydration, recheck a BMP. Asthma: On Advair, will control DJD, back pain: She has some leftover Ultram but really does not take anything regularly.  No UDS or contract at this time. Thrombocytopenia: Mild, fluctuant, recheck every few months. Nausea: Very sporadically has nausea, has some Phenergan on hand. RTC 2 weeks INR RTC 3 months office visit

## 2018-01-23 ENCOUNTER — Other Ambulatory Visit: Payer: Self-pay | Admitting: Internal Medicine

## 2018-02-02 ENCOUNTER — Other Ambulatory Visit: Payer: Self-pay | Admitting: Internal Medicine

## 2018-02-02 ENCOUNTER — Ambulatory Visit: Payer: Medicare Other

## 2018-02-04 ENCOUNTER — Other Ambulatory Visit: Payer: Medicare Other

## 2018-02-08 ENCOUNTER — Other Ambulatory Visit: Payer: Medicare Other

## 2018-02-09 ENCOUNTER — Other Ambulatory Visit: Payer: Self-pay

## 2018-02-09 ENCOUNTER — Other Ambulatory Visit: Payer: Medicare Other

## 2018-02-09 DIAGNOSIS — I4891 Unspecified atrial fibrillation: Secondary | ICD-10-CM

## 2018-02-15 ENCOUNTER — Other Ambulatory Visit: Payer: Medicare Other

## 2018-02-16 ENCOUNTER — Ambulatory Visit: Payer: Medicare Other

## 2018-02-22 ENCOUNTER — Ambulatory Visit: Payer: Medicare Other

## 2018-02-22 DIAGNOSIS — Z7901 Long term (current) use of anticoagulants: Secondary | ICD-10-CM

## 2018-02-22 NOTE — Progress Notes (Addendum)
Pre visit review using our clinic tool,if applicable. No additional management support is needed unless otherwise documented below in the visit note.   Pt here for INR check per order from Dr. Larose Kells.  Goal INR = 2.0-3.0  Last INR = 3.3  Pt currently takes Coumadin  2.5 mg on Mon,Wed,Thur,Fri,Sat,Sun. Coumadin 5 mg on Tuesday.  Pt denies recent antibiotics, no dietary changes and no unusual bruising / bleeding.  INR today = 2.3  Pt advised per Dr. Larose Kells no no changes and return for INR check in 4 weeks.  Kathlene November, MD

## 2018-03-01 ENCOUNTER — Other Ambulatory Visit: Payer: Self-pay | Admitting: Internal Medicine

## 2018-03-02 ENCOUNTER — Other Ambulatory Visit: Payer: Self-pay | Admitting: Internal Medicine

## 2018-03-02 DIAGNOSIS — R4182 Altered mental status, unspecified: Secondary | ICD-10-CM | POA: Diagnosis not present

## 2018-03-02 DIAGNOSIS — Z86711 Personal history of pulmonary embolism: Secondary | ICD-10-CM | POA: Diagnosis not present

## 2018-03-02 DIAGNOSIS — I482 Chronic atrial fibrillation: Secondary | ICD-10-CM | POA: Diagnosis not present

## 2018-03-02 DIAGNOSIS — Z96642 Presence of left artificial hip joint: Secondary | ICD-10-CM | POA: Diagnosis not present

## 2018-03-02 DIAGNOSIS — Z79899 Other long term (current) drug therapy: Secondary | ICD-10-CM | POA: Diagnosis not present

## 2018-03-02 DIAGNOSIS — R443 Hallucinations, unspecified: Secondary | ICD-10-CM | POA: Diagnosis not present

## 2018-03-02 DIAGNOSIS — Z7901 Long term (current) use of anticoagulants: Secondary | ICD-10-CM | POA: Diagnosis not present

## 2018-03-02 DIAGNOSIS — Z7951 Long term (current) use of inhaled steroids: Secondary | ICD-10-CM | POA: Diagnosis not present

## 2018-03-02 DIAGNOSIS — Z8673 Personal history of transient ischemic attack (TIA), and cerebral infarction without residual deficits: Secondary | ICD-10-CM | POA: Diagnosis not present

## 2018-03-02 DIAGNOSIS — I10 Essential (primary) hypertension: Secondary | ICD-10-CM | POA: Diagnosis not present

## 2018-03-02 DIAGNOSIS — R441 Visual hallucinations: Secondary | ICD-10-CM | POA: Diagnosis not present

## 2018-03-02 DIAGNOSIS — G319 Degenerative disease of nervous system, unspecified: Secondary | ICD-10-CM | POA: Diagnosis not present

## 2018-03-02 DIAGNOSIS — I4891 Unspecified atrial fibrillation: Secondary | ICD-10-CM | POA: Diagnosis not present

## 2018-03-03 ENCOUNTER — Encounter: Payer: Self-pay | Admitting: Internal Medicine

## 2018-03-04 ENCOUNTER — Encounter: Payer: Self-pay | Admitting: Internal Medicine

## 2018-03-04 ENCOUNTER — Telehealth: Payer: Self-pay

## 2018-03-04 NOTE — Telephone Encounter (Signed)
thx

## 2018-03-04 NOTE — Telephone Encounter (Signed)
Patient has been scheduled for 9/4 at 3:20.

## 2018-03-09 ENCOUNTER — Ambulatory Visit (INDEPENDENT_AMBULATORY_CARE_PROVIDER_SITE_OTHER): Payer: Medicare Other | Admitting: Internal Medicine

## 2018-03-09 ENCOUNTER — Ambulatory Visit (HOSPITAL_BASED_OUTPATIENT_CLINIC_OR_DEPARTMENT_OTHER)
Admission: RE | Admit: 2018-03-09 | Discharge: 2018-03-09 | Disposition: A | Discharge status: Home / Self Care | Payer: Medicare Other | Source: Ambulatory Visit | Attending: Internal Medicine | Admitting: Internal Medicine

## 2018-03-09 ENCOUNTER — Encounter: Payer: Self-pay | Admitting: Internal Medicine

## 2018-03-09 VITALS — BP 136/74 | HR 55 | Temp 97.5°F | Resp 14 | Ht 65.0 in | Wt 196.1 lb

## 2018-03-09 DIAGNOSIS — M4186 Other forms of scoliosis, lumbar region: Secondary | ICD-10-CM | POA: Diagnosis not present

## 2018-03-09 DIAGNOSIS — G8929 Other chronic pain: Secondary | ICD-10-CM

## 2018-03-09 DIAGNOSIS — M549 Dorsalgia, unspecified: Secondary | ICD-10-CM | POA: Diagnosis not present

## 2018-03-09 DIAGNOSIS — I700 Atherosclerosis of aorta: Secondary | ICD-10-CM | POA: Diagnosis not present

## 2018-03-09 DIAGNOSIS — M545 Low back pain: Secondary | ICD-10-CM | POA: Diagnosis not present

## 2018-03-09 DIAGNOSIS — M546 Pain in thoracic spine: Secondary | ICD-10-CM | POA: Diagnosis not present

## 2018-03-09 DIAGNOSIS — R410 Disorientation, unspecified: Secondary | ICD-10-CM | POA: Diagnosis not present

## 2018-03-09 DIAGNOSIS — M47814 Spondylosis without myelopathy or radiculopathy, thoracic region: Secondary | ICD-10-CM | POA: Diagnosis not present

## 2018-03-09 DIAGNOSIS — N390 Urinary tract infection, site not specified: Secondary | ICD-10-CM | POA: Diagnosis not present

## 2018-03-09 NOTE — Progress Notes (Signed)
Pre visit review using our clinic review tool, if applicable. No additional management support is needed unless otherwise documented below in the visit note. 

## 2018-03-09 NOTE — Patient Instructions (Signed)
Next visit in 3 months   STOP BY THE FIRST FLOOR:  get the XR   Tylenol  500 mg OTC 2 tabs a day every 8 hours as needed for pain  Call if no better in few days

## 2018-03-09 NOTE — Progress Notes (Signed)
Subjective:    Patient ID: [name], female    DOB: 29-Jun-1927, 84 y.o.   MRN: [id number]  DOS:  03/09/2018 Type of visit - description :  Interval history: Here with her daughter for ED follow-up. Went to the ED 03/02/2018, she presented with confusion/hallucinations, BP was 173/85, she was afebrile. CBC show a platelet count of 89, potassium 3.7, creatinine 1.45, LFTs normal.  INR 2.3.  Urinalysis shows some bacteria, CT head no acute. She was treated for blood pressure, got IV fluids and was discharged home. She improved. Urine culture eventually showed E. coli and Aerococcus urine, they called the patient and started antibiotics on 03/06/2018.   Review of Systems  In general she feels well, no more mental status changes. No fever, chills. No nausea, vomiting, diarrhea She is taking the antibiotics as prescribed although she saw some blood in the urine yesterday. No chest pain or difficulty breathing. Right leg was noted to be slightly swollen, patient  denies any pain. She has chronic thoracic back pain, slightly worse lately, her daughter has a leftover fentanyl patch and put a small amount on her back and she feels somewhat better.  No recent falls.  Past Medical History:  Diagnosis Date  . Acute encephalopathy   . Acute renal failure superimposed on stage 3 chronic kidney disease ([place])   . Anemia   . Atrial fibrillation ([place])   . Colon cancer ([place]) 12/11  . Colon cancer ([place]) 2011  . Diarrhea   . DVT (deep venous thrombosis) ([place]) 11/06  . Elevated brain natriuretic peptide (BNP) level   . Frequent headaches   . Heart murmur   . Hyperkalemia   . Hypertension   . Pulmonary embolism ([place]) 11/06  . Seizures ([place])   . Stroke [facility]) 11/30/2014    Past Surgical History:  Procedure Laterality Date  . APPENDECTOMY  1952  . COLECTOMY  06/2010   partial, Dr. Donne Hazel complicated by LLE CVT; S/P IVC umbrella & anemia  . HIP FRACTURE SURGERY  2006   Trauma  . TOTAL  ABDOMINAL HYSTERECTOMY W/ BILATERAL SALPINGOOPHORECTOMY  1973   For Fibroids  . TOTAL HIP ARTHROPLASTY  01/03/07   Left hip replacement.  Marland Kitchen VENA CAVA FILTER PLACEMENT  06/28/2010    Social History   Socioeconomic History  . Marital status: Widowed    Spouse name: Not on file  . Number of children: 1  . Years of education: Not on file  . Highest education level: Not on file  Occupational History  . Occupation: n/d    Employer: RETIRED  Social Needs  . Financial resource strain: Not on file  . Food insecurity:    Worry: Not on file    Inability: Not on file  . Transportation needs:    Medical: Not on file    Non-medical: Not on file  Tobacco Use  . Smoking status: Passive Smoke Exposure - Never Smoker  . Smokeless tobacco: Never Used  . Tobacco comment: husband smoked in home.   Substance and Sexual Activity  . Alcohol use: No    Alcohol/week: 0.0 standard drinks  . Drug use: No  . Sexual activity: Not Currently  Lifestyle  . Physical activity:    Days per week: Not on file    Minutes per session: Not on file  . Stress: Not on file  Relationships  . Social connections:    Talks on phone: Not on file    Gets together: Not on file  Attends religious service: Not on file    Active member of club or organization: Not on file    Attends meetings of clubs or organizations: Not on file    Relationship status: Not on file  . Intimate partner violence:    Fear of current or ex partner: Not on file    Emotionally abused: Not on file    Physically abused: Not on file    Forced sexual activity: Not on file  Other Topics Concern  . Not on file  Social History Narrative   Lives w/ her daughter       Allergies as of 03/09/2018   No Known Allergies     Medication List        Accurate as of 03/09/18 11:59 PM. Always use your most recent med list.          acetaminophen 500 MG tablet Commonly known as:  TYLENOL Take 500 mg by mouth every 8 (eight) hours as needed  for mild pain.   ADVAIR DISKUS 100-50 MCG/DOSE Aepb Generic drug:  Fluticasone-Salmeterol Inhale 1 puff into the lungs 2 (two) times daily.   albuterol (2.5 MG/3ML) 0.083% nebulizer solution Commonly known as:  PROVENTIL Take 3 mLs (2.5 mg total) by nebulization 3 (three) times daily as needed for wheezing or shortness of breath.   PROAIR HFA 108 (90 Base) MCG/ACT inhaler Generic drug:  albuterol Inhale 2 puffs into the lungs every 6 (six) hours as needed for wheezing or shortness of breath.   aspirin 81 MG tablet Take 81 mg by mouth daily.   atorvastatin 20 MG tablet Commonly known as:  LIPITOR Take 1 tablet (20 mg total) by mouth daily.   CALCIUM 1200 PO Take 2 tablets by mouth daily.   cephALEXin 500 MG capsule Commonly known as:  KEFLEX Take 500 mg by mouth 4 (four) times daily.   furosemide 40 MG tablet Commonly known as:  LASIX Take 1 tablet (40 mg total) by mouth daily.   loperamide 2 MG tablet Commonly known as:  IMODIUM A-D Give 2 mg by mouth with first loose stool. Give 2 mg by mouth for additional loose stool.   loratadine 10 MG tablet Commonly known as:  CLARITIN Take 10 mg by mouth daily.   metoprolol tartrate 25 MG tablet Commonly known as:  LOPRESSOR Take 1.5 tablets (37.5 mg total) by mouth 2 (two) times daily.   MULTIPLE VITAMINS/WOMENS PO Take by mouth daily.   promethazine 12.5 MG tablet Commonly known as:  PHENERGAN Take 1 tablet (12.5 mg total) by mouth 3 (three) times daily as needed for nausea or vomiting.   traMADol 50 MG tablet Commonly known as:  ULTRAM Take 1 tablet (50 mg total) by mouth every 6 (six) hours as needed for moderate pain.   verapamil 120 MG tablet Commonly known as:  CALAN Take 1 tablet (120 mg total) by mouth every evening.   warfarin 2.5 MG tablet Commonly known as:  COUMADIN Take as directed by the anticoagulation clinic. If you are unsure how to take this medication, talk to your nurse or doctor. Original  instructions:  USE AS DIRECTED BY COUMADIN CLINIC          Objective:   Physical Exam BP 136/74 (BP Location: Left Arm, Patient Position: Sitting, Cuff Size: Small)   Pulse (!) 55   Temp (!) 97.5 F (36.4 C) (Oral)   Resp 14   Ht 5\' 5"  (1.651 m)   Wt 196 lb 2 oz (  89 kg)   SpO2 95%   BMI 32.64 kg/m  General:   Well developed, NAD, see BMI.  HEENT:  Normocephalic . Face symmetric, atraumatic Lungs:  CTA B Normal respiratory effort, no intercostal retractions, no accessory muscle use. Heart: Irregular no murmur.  Right calf is slightly larger, less than 2 cm in diameter, not tender, no red Abdomen: Exam limited, patient sitting in a wheelchair. Not distended, soft, non-tender. No rebound or rigidity.   Skin: Not pale. Not jaundice Neurologic:  alert & oriented X3.  Speech normal, gait not tested Psych--  Cognition and judgment appear intact.  Cooperative with normal attention span and concentration.  Behavior appropriate. No anxious or depressed appearing.     Assessment & Plan:    Assessment (transfer Dr Linna Darner [phone]) HTN Hyperlipidemia insomnia - xanax prn Asthma  Atrial fibrillation--rate controlled, anticoag Stroke 11-2014, no residual deficits (TIA?) Heart murmur: Mitral regurgitation DVT and pulmonary emboli 2006, vena cava filter 2011  MSK: tramadol qd  --DJD --Back pain has seen Dr Nelva Bush, dr Rolena Infante, MRI: djd, stenosis, med treatment Colon cancer 2011 -- Dr Annitta Jersey , d/c from oncology 06-2015 , No further colonoscopies Thrombocytopenia: Mild, fluctuating  PLAN See HPI: UTI: Recently diagnosed with a UTI, started antibiotics 3 days ago, on cephalexin.  Expect good response based on cultures.  We agreed to continue with antibiotics, push fluids and call if symptoms have not subsided.  Second round of antibiotics?. Confusion: See HPI, resolved. Related to UTI? Back pain: Chronic issue, mostly located at the thoracic spine, will get a x-ray to be sure  there is no new problems.  They have a leftover fentanyl patch, daughter applied 1/3 of it yesterday with good results however I recommend Tylenol as the primary medication to help with pain. INR: Recently was 2.3, recheck in 2 weeks. RTC   3 months

## 2018-03-10 NOTE — Assessment & Plan Note (Signed)
See HPI: UTI: Recently diagnosed with a UTI, started antibiotics 3 days ago, on cephalexin.  Expect good response based on cultures.  We agreed to continue with antibiotics, push fluids and call if symptoms have not subsided.  Second round of antibiotics?. Confusion: See HPI, resolved. Related to UTI? Back pain: Chronic issue, mostly located at the thoracic spine, will get a x-ray to be sure there is no new problems.  They have a leftover fentanyl patch, daughter applied 1/3 of it yesterday with good results however I recommend Tylenol as the primary medication to help with pain. INR: Recently was 2.3, recheck in 2 weeks. RTC   3 months

## 2018-03-11 ENCOUNTER — Encounter: Payer: Self-pay | Admitting: Internal Medicine

## 2018-03-11 MED ORDER — SULFAMETHOXAZOLE-TRIMETHOPRIM 800-160 MG PO TABS: 1.0000 | ORAL_TABLET | Freq: Two times a day (BID) | ORAL | 0 refills | Status: DC

## 2018-03-11 NOTE — Telephone Encounter (Signed)
Bactrim DS 1 p.o. twice daily #10 no refills  Received: Today  Message Contents  Megan Branch, MD  Damita Dunnings, [place]

## 2018-03-15 ENCOUNTER — Telehealth: Payer: Self-pay

## 2018-03-15 NOTE — Telephone Encounter (Signed)
Attempted to notify pt's daughter and left detailed message on her voicemail regarding below recommendation and to call back if symptoms are not improving.

## 2018-03-15 NOTE — Telephone Encounter (Signed)
Copied from [place] [phone]. Topic: General - Other >> Mar 15, 2018  2:49 PM [name], NT wrote: Reason for CRM: Patients daughter called to schedule the INR for her mom for 03/17/18 and she is wanting to see if [name] is wanting her mom to repeat her urinalysis as well to follow-up from the UTI she has had? Please advise.

## 2018-03-15 NOTE — Telephone Encounter (Signed)
Please advise 

## 2018-03-15 NOTE — Telephone Encounter (Signed)
If she is improving with Bactrim, I do not think we need to recheck her urine.

## 2018-03-16 ENCOUNTER — Encounter: Payer: Self-pay | Admitting: Internal Medicine

## 2018-03-16 ENCOUNTER — Other Ambulatory Visit: Payer: Self-pay | Admitting: Internal Medicine

## 2018-03-16 MED ORDER — QUETIAPINE FUMARATE 25 MG PO TABS: 25.0000 mg | ORAL_TABLET | Freq: Every day | ORAL | 1 refills | Status: DC

## 2018-03-17 ENCOUNTER — Ambulatory Visit: Payer: Medicare Other

## 2018-03-18 ENCOUNTER — Encounter: Payer: Self-pay | Admitting: Internal Medicine

## 2018-03-18 DIAGNOSIS — R443 Hallucinations, unspecified: Secondary | ICD-10-CM

## 2018-03-21 ENCOUNTER — Ambulatory Visit: Payer: Medicare Other

## 2018-03-23 ENCOUNTER — Ambulatory Visit: Payer: Medicare Other

## 2018-04-05 ENCOUNTER — Encounter: Payer: Self-pay | Admitting: Internal Medicine

## 2018-04-12 ENCOUNTER — Ambulatory Visit: Payer: Medicare Other

## 2018-04-14 ENCOUNTER — Ambulatory Visit: Payer: Medicare Other

## 2018-04-14 ENCOUNTER — Telehealth: Payer: Self-pay

## 2018-04-14 NOTE — Telephone Encounter (Signed)
Copied from [place] [phone]. Topic: Appointment Scheduling - Scheduling Inquiry for Clinic >> Apr 14, 2018  8:58 AM Margot Ables wrote: Reason for CRM: daughter called to RS INR from today, she is requesting 04/20/18 around 12-1pm or about 4pm as pt has a hard time getting out of the house and has another appt 10/16 so she'll already be out

## 2018-04-15 NOTE — Telephone Encounter (Signed)
Pt is requesting to come on 04/20/18 @4pm . The schedule is full please advise?

## 2018-04-15 NOTE — Telephone Encounter (Signed)
This patient needs to come when she can under her circumstances.

## 2018-04-20 ENCOUNTER — Encounter: Payer: Self-pay | Admitting: Cardiovascular Disease

## 2018-04-20 ENCOUNTER — Ambulatory Visit (INDEPENDENT_AMBULATORY_CARE_PROVIDER_SITE_OTHER): Payer: Medicare Other

## 2018-04-20 ENCOUNTER — Ambulatory Visit (INDEPENDENT_AMBULATORY_CARE_PROVIDER_SITE_OTHER): Payer: Medicare Other | Admitting: Cardiovascular Disease

## 2018-04-20 VITALS — BP 130/60 | HR 76 | Ht 65.0 in | Wt 196.2 lb

## 2018-04-20 DIAGNOSIS — Z7901 Long term (current) use of anticoagulants: Secondary | ICD-10-CM

## 2018-04-20 DIAGNOSIS — I5032 Chronic diastolic (congestive) heart failure: Secondary | ICD-10-CM | POA: Diagnosis not present

## 2018-04-20 DIAGNOSIS — I4819 Other persistent atrial fibrillation: Secondary | ICD-10-CM | POA: Diagnosis not present

## 2018-04-20 DIAGNOSIS — Z23 Encounter for immunization: Secondary | ICD-10-CM | POA: Diagnosis not present

## 2018-04-20 LAB — POCT INR: INR: 3.1 — AB (ref 2.0–3.0)

## 2018-04-20 NOTE — Progress Notes (Signed)
Chief Complaint  Patient presents with  . Follow-up    PAF   History of Present Illness: 84 yo female with history of paroxysmal atrial fibrillation, moderate LVH, mild MR, anemia, DVT/PE s/p IVC filter, colon cancer s/p colectomy here today for follow up. I saw her as a new patient for evaluation of irregular heart rhythm in February 2013. She had palpitations starting in 2011 and was found to be anemic at that time and then her colon cancer was found. This was complicated by DVT and PE. She had an IVC filter placed in December 2011. She was started on coumadin back in 2006 after a traumatic hip injury and this was restarted in October of 2011. She has had no bleeding issues since then. Echo 10/07/11 with normal LV function, mild LVH, mild MR, severe biatrial enlargement, PA pressure 54-32mmHg. She had a CVA in May 2016 and was admitted to [facility]. She completely recovered from her CVA. Her INR was therapeutic at the time. Echo May 2016 at [facility] with normal LV function, severe TR, mild to moderate MR, severe biatrial enlargement. Carotid dopplers at [facility] in 2016 with no evidence of carotid disease. She was admitted to [facility] February 2017 with elevated INR, epistaxis. She was seen in our office 11/04/17 by Lyda Jester, PA and had c/o dyspnea but denied LE edema or PND. Her HR was elevated at 100 bpm. Her Lopressor was increased. Echo 11/18/17 with LVEF=65-70%, moderate TR, estimated RSVP 90 mmHg. BNP 1330. Her lasix was increased and this helped with her lower extremity edema   She is here today for follow up. The patient denies any chest pain, dyspnea, palpitations, orthopnea, PND, dizziness, near syncope or syncope. She has LE edema that gets better at night. She has been taking lasix 40 mg daily. She has had issues with recurrent UTI's. She has been having hallucinations and seeing people in her house.   Primary Care Physician: Colon Branch, MD  Past Medical  History:  Diagnosis Date  . Acute encephalopathy   . Acute renal failure superimposed on stage 3 chronic kidney disease ([place])   . Anemia   . Atrial fibrillation ([employer])   . Colon cancer ([place]) 12/11  . Colon cancer ([place]) 2011  . Diarrhea   . DVT (deep venous thrombosis) ([place]) 11/06  . Elevated brain natriuretic peptide (BNP) level   . Frequent headaches   . Heart murmur   . Hyperkalemia   . Hypertension   . Pulmonary embolism ([place]) 11/06  . Seizures ([place])   . Stroke [facility]) 11/30/2014    Past Surgical History:  Procedure Laterality Date  . APPENDECTOMY  1952  . COLECTOMY  06/2010   partial, Dr. Donne Hazel complicated by LLE CVT; S/P IVC umbrella & anemia  . HIP FRACTURE SURGERY  2006   Trauma  . TOTAL ABDOMINAL HYSTERECTOMY W/ BILATERAL SALPINGOOPHORECTOMY  1973   For Fibroids  . TOTAL HIP ARTHROPLASTY  01/03/07   Left hip replacement.  Marland Kitchen VENA CAVA FILTER PLACEMENT  06/28/2010    Current Outpatient Medications  Medication Sig Dispense Refill  . acetaminophen (TYLENOL) 500 MG tablet Take 500 mg by mouth every 8 (eight) hours as needed for mild pain.     Marland Kitchen ADVAIR DISKUS 100-50 MCG/DOSE AEPB Inhale 1 puff into the lungs 2 (two) times daily. 60 each 5  . albuterol (PROVENTIL) (2.5 MG/3ML) 0.083% nebulizer solution Take 3 mLs (2.5 mg total) by nebulization 3 (three) times daily as needed  for wheezing or shortness of breath. 150 mL 5  . aspirin 81 MG tablet Take 81 mg by mouth daily.    Marland Kitchen atorvastatin (LIPITOR) 20 MG tablet Take 1 tablet (20 mg total) by mouth daily. 90 tablet 2  . Calcium Carbonate-Vit D-Min (CALCIUM 1200 PO) Take 2 tablets by mouth daily.     . furosemide (LASIX) 40 MG tablet Take 1 tablet (40 mg total) by mouth daily. 90 tablet 3  . loperamide (IMODIUM A-D) 2 MG tablet Give 2 mg by mouth with first loose stool. Give 2 mg by mouth for additional loose stool.    Marland Kitchen loratadine (CLARITIN) 10 MG tablet Take 10 mg by mouth daily.    . metoprolol tartrate (LOPRESSOR) 25 MG  tablet Take 1.5 tablets (37.5 mg total) by mouth 2 (two) times daily. 90 tablet 3  . Multiple Vitamins-Minerals (MULTIPLE VITAMINS/WOMENS PO) Take by mouth daily.    Marland Kitchen PROAIR HFA 108 (90 Base) MCG/ACT inhaler Inhale 2 puffs into the lungs every 6 (six) hours as needed for wheezing or shortness of breath. 18 g 5  . promethazine (PHENERGAN) 12.5 MG tablet Take 1 tablet (12.5 mg total) by mouth 3 (three) times daily as needed for nausea or vomiting. 30 tablet 1  . QUEtiapine (SEROQUEL) 25 MG tablet Take 1-2 tablets (25-50 mg total) by mouth at bedtime. 60 tablet 1  . sulfamethoxazole-trimethoprim (BACTRIM DS,SEPTRA DS) 800-160 MG tablet Take 1 tablet by mouth 2 (two) times daily. 10 tablet 0  . traMADol (ULTRAM) 50 MG tablet Take 1 tablet (50 mg total) by mouth every 6 (six) hours as needed for moderate pain. 30 tablet 1  . verapamil (CALAN) 120 MG tablet Take 1 tablet (120 mg total) by mouth every evening. 90 tablet 1  . warfarin (COUMADIN) 2.5 MG tablet USE AS DIRECTED BY COUMADIN CLINIC 90 tablet 0   No current facility-administered medications for this visit.     No Known Allergies  Social History   Socioeconomic History  . Marital status: Widowed    Spouse name: Not on file  . Number of children: 1  . Years of education: Not on file  . Highest education level: Not on file  Occupational History  . Occupation: n/d    Employer: RETIRED  Social Needs  . Financial resource strain: Not on file  . Food insecurity:    Worry: Not on file    Inability: Not on file  . Transportation needs:    Medical: Not on file    Non-medical: Not on file  Tobacco Use  . Smoking status: Passive Smoke Exposure - Never Smoker  . Smokeless tobacco: Never Used  . Tobacco comment: husband smoked in home.   Substance and Sexual Activity  . Alcohol use: No    Alcohol/week: 0.0 standard drinks  . Drug use: No  . Sexual activity: Not Currently  Lifestyle  . Physical activity:    Days per week: Not on  file    Minutes per session: Not on file  . Stress: Not on file  Relationships  . Social connections:    Talks on phone: Not on file    Gets together: Not on file    Attends religious service: Not on file    Active member of club or organization: Not on file    Attends meetings of clubs or organizations: Not on file    Relationship status: Not on file  . Intimate partner violence:    Fear of current or  ex partner: Not on file    Emotionally abused: Not on file    Physically abused: Not on file    Forced sexual activity: Not on file  Other Topics Concern  . Not on file  Social History Narrative   Lives w/ her daughter     Family History  Problem Relation Age of Onset  . Peripheral vascular disease Father   . Heart attack Mother 51  . Heart disease Mother        MI  . Coronary artery disease Sister   . Diabetes Paternal Grandmother   . Coronary artery disease Maternal Grandfather     Review of Systems:  As stated in the HPI and otherwise negative.   BP 130/60   Pulse 76   Ht 5\' 5"  (1.651 m)   Wt 196 lb 3.2 oz (89 kg)   SpO2 96%   BMI 32.65 kg/m   Physical Examination:  General: Well developed, well nourished, NAD  HEENT: OP clear, mucus membranes moist  SKIN: warm, dry. No rashes. Neuro: No focal deficits  Musculoskeletal: Muscle strength 5/5 all ext  Psychiatric: Mood and affect normal  Neck: No JVD, no carotid bruits, no thyromegaly, no lymphadenopathy.  Lungs:Clear bilaterally, no wheezes, rhonci, crackles Cardiovascular:  Irregular irregular. No murmurs, gallops or rubs. Abdomen:Soft. Bowel sounds present. Non-tender.  Extremities: No lower extremity edema. Pulses are 2 + in the bilateral DP/PT.   Echo May 2019: - Left ventricle: The cavity size was normal. Wall thickness was   increased in a pattern of moderate LVH. Systolic function was   vigorous. The estimated ejection fraction was in the range of 65%   to 70%. Wall motion was normal; there were no  regional wall   motion abnormalities. - Left atrium: The atrium was severely dilated. - Right atrium: The atrium was severely dilated. - Tricuspid valve: There was moderate regurgitation. - Pulmonary arteries: Systolic pressure was severely increased. PA   peak pressure: 90 mm Hg (S).  EKG:  EKG is not ordered today. The ekg ordered today demonstrates    Recent Labs: 01/13/2018: ALT 17; Hemoglobin 14.5; NT-Pro BNP 821; Platelets 107 01/18/2018: BUN 37; Creatinine, Ser 1.49; Potassium 3.4; Sodium 145   Lipid Panel    Component Value Date/Time   CHOL 97 08/16/2017 1408   TRIG 56.0 08/16/2017 1408   HDL 37.70 (L) 08/16/2017 1408   CHOLHDL 3 08/16/2017 1408   VLDL 11.2 08/16/2017 1408   LDLCALC 48 08/16/2017 1408     Wt Readings from Last 3 Encounters:  04/20/18 196 lb 3.2 oz (89 kg)  03/09/18 196 lb 2 oz (89 kg)  01/18/18 198 lb (89.8 kg)     Other studies Reviewed: Additional studies/ records that were reviewed today include: . Review of the above records demonstrates:    Assessment and Plan:   1. ATRIAL FIBRILLATION, permanent: She is in atrial fibrillation today. Her rate is controlled. Will continue coumadin and the beta blocker. She has not wished to consider a DOAC in the past.    2. Mitral regurgitation: mild by echo in 2019.   3. History of CVA: May 2016. No residual deficits. Her INR was therapeutic at that time. She is on an ASA and a statin per Neurology post CVA.    4. Chronic diastolic dysfunction: LV systolic function is normal by echo in 2019. Her weight is stable and volume status is ok on daily Lasix. Continue Lasix.    Current medicines are reviewed at  length with the patient today.  The patient does not have concerns regarding medicines.  The following changes have been made:  no change  Labs/ tests ordered today include:   No orders of the defined types were placed in this encounter.   Disposition:   FU with me in 6 months.     Signed, Lauree Chandler, MD 04/20/2018 3:08 PM    [facility] [address], [place], [place]  [address] Phone: [phone]; Fax: [phone]

## 2018-04-20 NOTE — Progress Notes (Addendum)
Pre visit review using our clinic tool,if applicable. No additional management support is needed unless otherwise documented below in the visit note.   Pt here for INR check per order from Dr. Larose Kells.  Goal INR = 2.0-3.0  Last INR = 2.3  Pt currently takes Coumadin  5 mg on Tuesday and 2.5 mg all other days.  Pt denies recent antibiotics, no dietary changes and no unusual bruising / bleeding.  INR today = 3.1  Pt advised per Dr. Larose Kells skip 1 dose Coumadin then resume as previously taking. Return in 4 weeks for INR check.  ---- minimally elevated, will skip 1 dose and then continue the same meds. Kathlene November, MD

## 2018-04-20 NOTE — Patient Instructions (Signed)

## 2018-04-27 ENCOUNTER — Other Ambulatory Visit: Payer: Self-pay | Admitting: Internal Medicine

## 2018-04-27 ENCOUNTER — Ambulatory Visit: Payer: Medicare Other | Admitting: Internal Medicine

## 2018-05-18 ENCOUNTER — Ambulatory Visit (INDEPENDENT_AMBULATORY_CARE_PROVIDER_SITE_OTHER): Payer: Medicare Other | Admitting: Neurology

## 2018-05-18 ENCOUNTER — Telehealth: Payer: Self-pay | Admitting: Neurology

## 2018-05-18 ENCOUNTER — Encounter: Payer: Self-pay | Admitting: Neurology

## 2018-05-18 ENCOUNTER — Ambulatory Visit (INDEPENDENT_AMBULATORY_CARE_PROVIDER_SITE_OTHER): Payer: Medicare Other

## 2018-05-18 VITALS — BP 120/70 | HR 84

## 2018-05-18 DIAGNOSIS — Z0000 Encounter for general adult medical examination without abnormal findings: Secondary | ICD-10-CM

## 2018-05-18 DIAGNOSIS — R441 Visual hallucinations: Secondary | ICD-10-CM

## 2018-05-18 DIAGNOSIS — Z7901 Long term (current) use of anticoagulants: Secondary | ICD-10-CM

## 2018-05-18 DIAGNOSIS — I4811 Longstanding persistent atrial fibrillation: Secondary | ICD-10-CM

## 2018-05-18 DIAGNOSIS — R413 Other amnesia: Secondary | ICD-10-CM | POA: Diagnosis not present

## 2018-05-18 LAB — POCT INR: INR: 3.9 — AB (ref 2.0–3.0)

## 2018-05-18 MED ORDER — WARFARIN SODIUM 2 MG PO TABS: 2.0000 mg | ORAL_TABLET | Freq: Every day | ORAL | 3 refills | Status: DC

## 2018-05-18 NOTE — Progress Notes (Signed)
Subjective:    Patient ID: [name] is a 84 y.o. female.  HPI     Star Age, MD, PhD [facility] [address], [address] [place], [place] [address]  Dear Dr. Larose Kells,   I saw your patient, [name], upon your kind request in my neurologic clinic today for initial consultation of her memory loss. The patient is accompanied by her daughter today. As you know, Ms. [name] is an 84 year old right-handed woman with an underlying Medical history of hypertension, heart murmur, paroxysmal A. fib, on chronic anticoagulation, history of seizure, history of stroke, DVT and PE with status post IVC filter placement, history of colon cancer with status post colectomy, anemia, chronic kidney disease, recent history of hallucinations, and obesity, who reports vivid visual hallucinations for the past nearly 3 months. Her daughter reports that these were fairly sudden in onset. Daughter was away for a few days and when she returned patient reported vivid visual hallucinations, most of these are not scary but very disturbing sometimes to her as she gets very involved, she has never tried to defend herself or run away, no falls. She has difficulty with mobility and uses a walker. Daughter took her to the emergency room on 03/02/2018 and while she was there she was checked for UTI but initially they were told she did not have a UTI, when they got home, her daughter was told patient did have a UTI and a antibiotic was called in. She then had a second round of antibiotics. Nevertheless, she still has nearly daily visual hallucinations, she can describe these hallucinations in quite detail today. The hallucinations sent to be worse towards the end of the day and she typically sees people, most typically in the 10, not so much in the rest of the house, sometimes in the kitchen. She was recently treated for suspected UTI. She was recently started on Seroquel for hallucinations, but has not  tried it yet for fear of SEs. She is followed by cardiology for her A. fib. She presented to the emergency room and August 2019 for hallucinations. She had a head CT without contrast on 03/02/2018 and I reviewed the results: IMPRESSION: No acute intracranial abnormality. Findings showed atrophy and chronic ischemic changes. Of note, she had a brain MRI without contrast on 09/17/2015 and I reviewed the results: IMPRESSION: 1. Motion degraded incomplete examination.  No acute infarct. 2. Moderately advanced chronic small vessel ischemic disease. She had a brain MRI with and without contrast on 12/01/2014 which I reviewed:  IMPRESSION: 2 tiny acute/subacute infarcts in the right frontal lobe and right parietal lobe. Of note, she has not seen an ophthalmologist in over 3 years. She has lately reported some vision issues. Daughter would be willing to make an appointment with the ophthalmologist. She had cataract removal some years ago. Daughter reports that she does not always drink enough, she encourages patient to drink water and Gatorade. Patient is the third of a total of 4 siblings. Her other siblings have passed away. She has no family history of dementia. She has one daughter and lives with her daughter and daughter's husband and one cat.   Her Past Medical History Is Significant For: Past Medical History:  Diagnosis Date  . Acute encephalopathy   . Acute renal failure superimposed on stage 3 chronic kidney disease ([place])   . Anemia   . Atrial fibrillation ([place])   . Colon cancer ([place]) 12/11  . Colon cancer ([place]) 2011  .  Diarrhea   . DVT (deep venous thrombosis) ([place]) 11/06  . Elevated brain natriuretic peptide (BNP) level   . Frequent headaches   . Heart murmur   . Hyperkalemia   . Hypertension   . Pulmonary embolism ([place]) 11/06  . Seizures ([place])   . Stroke [facility]) 11/30/2014    Her Past Surgical History Is Significant For: Past Surgical History:  Procedure Laterality Date  .  APPENDECTOMY  1952  . COLECTOMY  06/2010   partial, Dr. Donne Hazel complicated by LLE CVT; S/P IVC umbrella & anemia  . HIP FRACTURE SURGERY  2006   Trauma  . TOTAL ABDOMINAL HYSTERECTOMY W/ BILATERAL SALPINGOOPHORECTOMY  1973   For Fibroids  . TOTAL HIP ARTHROPLASTY  01/03/07   Left hip replacement.  Marland Kitchen VENA CAVA FILTER PLACEMENT  06/28/2010    Her Family History Is Significant For: Family History  Problem Relation Age of Onset  . Peripheral vascular disease Father   . Heart attack Mother 12  . Heart disease Mother        MI  . Coronary artery disease Sister   . Diabetes Paternal Grandmother   . Coronary artery disease Maternal Grandfather     Her Social History Is Significant For: Social History   Socioeconomic History  . Marital status: Widowed    Spouse name: Not on file  . Number of children: 1  . Years of education: Not on file  . Highest education level: Not on file  Occupational History  . Occupation: n/d    Employer: RETIRED  Social Needs  . Financial resource strain: Not on file  . Food insecurity:    Worry: Not on file    Inability: Not on file  . Transportation needs:    Medical: Not on file    Non-medical: Not on file  Tobacco Use  . Smoking status: Passive Smoke Exposure - Never Smoker  . Smokeless tobacco: Never Used  . Tobacco comment: husband smoked in home.   Substance and Sexual Activity  . Alcohol use: No    Alcohol/week: 0.0 standard drinks  . Drug use: No  . Sexual activity: Not Currently  Lifestyle  . Physical activity:    Days per week: Not on file    Minutes per session: Not on file  . Stress: Not on file  Relationships  . Social connections:    Talks on phone: Not on file    Gets together: Not on file    Attends religious service: Not on file    Active member of club or organization: Not on file    Attends meetings of clubs or organizations: Not on file    Relationship status: Not on file  Other Topics Concern  . Not on file   Social History Narrative   Lives w/ her daughter     Her Allergies Are:  No Known Allergies:   Her Current Medications Are:  Outpatient Encounter Medications as of 05/18/2018  Medication Sig  . acetaminophen (TYLENOL) 500 MG tablet Take 500 mg by mouth every 8 (eight) hours as needed for mild pain.   Marland Kitchen ADVAIR DISKUS 100-50 MCG/DOSE AEPB Inhale 1 puff into the lungs 2 (two) times daily.  Marland Kitchen albuterol (PROVENTIL) (2.5 MG/3ML) 0.083% nebulizer solution Take 3 mLs (2.5 mg total) by nebulization 3 (three) times daily as needed for wheezing or shortness of breath.  Marland Kitchen aspirin 81 MG tablet Take 81 mg by mouth daily.  Marland Kitchen atorvastatin (LIPITOR) 20 MG tablet Take 1 tablet (20  mg total) by mouth daily.  . Calcium Carbonate-Vit D-Min (CALCIUM 1200 PO) Take 2 tablets by mouth daily.   . furosemide (LASIX) 40 MG tablet Take 1 tablet (40 mg total) by mouth daily.  Marland Kitchen loperamide (IMODIUM A-D) 2 MG tablet Give 2 mg by mouth with first loose stool. Give 2 mg by mouth for additional loose stool.  Marland Kitchen loratadine (CLARITIN) 10 MG tablet Take 10 mg by mouth daily.  . metoprolol tartrate (LOPRESSOR) 25 MG tablet Take 1.5 tablets (37.5 mg total) by mouth 2 (two) times daily.  . Multiple Vitamins-Minerals (MULTIPLE VITAMINS/WOMENS PO) Take by mouth daily.  Marland Kitchen PROAIR HFA 108 (90 Base) MCG/ACT inhaler Inhale 2 puffs into the lungs every 6 (six) hours as needed for wheezing or shortness of breath.  . promethazine (PHENERGAN) 12.5 MG tablet Take 1 tablet (12.5 mg total) by mouth 3 (three) times daily as needed for nausea or vomiting.  . sulfamethoxazole-trimethoprim (BACTRIM DS,SEPTRA DS) 800-160 MG tablet Take 1 tablet by mouth 2 (two) times daily.  . traMADol (ULTRAM) 50 MG tablet Take 1 tablet (50 mg total) by mouth every 6 (six) hours as needed for moderate pain.  . verapamil (CALAN) 120 MG tablet Take 1 tablet (120 mg total) by mouth every evening.  . warfarin (COUMADIN) 2.5 MG tablet USE AS DIRECTED BY COUMADIN CLINIC   . QUEtiapine (SEROQUEL) 25 MG tablet Take 1-2 tablets (25-50 mg total) by mouth at bedtime. (Patient not taking: Reported on 05/18/2018)   No facility-administered encounter medications on file as of 05/18/2018.   :  Review of Systems:  Out of a complete 14 point review of systems, all are reviewed and negative with the exception of these symptoms as listed be Review of Systems  Neurological:       Pt presents today to discuss her visual hallucinations. Sometimes the hallucinations can be auditory. Pt denies memory problems. Pt's daughter wants to know Dr. Guadelupe Sabin thoughts on taking seroquel.    Objective:  Neurological Exam  Physical Exam Physical Examination:   Vitals:   05/18/18 1420  BP: 120/70  Pulse: 84    General Examination: The patient is a very pleasant 84 y.o. female in no acute distress. She appears well-developed and well-nourished and well groomed. She is very pleasant and conversant. She is able to describe her typical hallucinations very much in detail. Sitting in [place].  HEENT: Normocephalic, atraumatic, pupils are equal, round and reactive to light and accommodation. Funduscopic exam is difficult. She is status post cataract repairs. Extraocular tracking is fairly well preserved. Hearing is perhaps mildly impaired, face is symmetric. She has no facial masking. She has no dysarthria. Tongue protrudes centrally and palate elevates symmetrically. She has no facial asymmetry.  Chest: Clear to auscultation without wheezing, rhonchi or crackles noted.  Heart: S1+S2+0, Irregularly irregular. No murmurs noted.   Abdomen: Soft, non-tender and non-distended with normal bowel sounds appreciated on auscultation.  Extremities: There is trace edema in the distal legs.   Skin: Warm and dry without trophic changes noted.  Musculoskeletal: exam reveals no obvious joint deformities, tenderness or joint swelling or erythema.   Neurologically:  Mental status: The patient is  awake, alert and oriented in all 4 spheres, except for exact date.  Speech is clear with normal prosody and enunciation. Thought process is linear. Mood is normal and affect is normal.   On 05/18/2018: MMSE: 23/30, CDT: 1.5/4, AFT: 7/min. She has significant difficulty with tracing the 2 intersecting objects and drawing the clock.  Cranial nerves II - XII are as described above under HEENT exam. In addition: shoulder shrug is normal with equal shoulder height noted. Motor exam: Normal bulk, strength and tone is noted. There is no drift, tremor or rebound. Romberg is negative. Reflexes are 1+ in the upper extremities, absent in the lower extremities. Fine motor skills are globally mildly impaired. Romberg was not safe to test. Cerebellar testing shows no dysmetria or intention tremor. She was not safe to stand or walk because she did not bring her walker. Sensory exam is intact to light touch. Assessment and Plan:    In summary, DAYLYN CHRISTINE is a very pleasant 84 y.o.-year old female with an underlying Medical history of hypertension, heart murmur, paroxysmal A. fib, on chronic anticoagulation, history of seizure, history of stroke, DVT and PE with status post IVC filter placement, history of colon cancer with status post colectomy, anemia, chronic kidney disease, recent history of hallucinations, and obesity, who presents for evaluation of her memory loss and hallucinations. On examination, she does have mild memory loss, could be mild vascular dementia but could also be an age-appropriate realm, she has very strikingly vivid visual hallucinations which overall have not been scary but are consistent on a day-to-day basis. She was recently treated for a UTI and had a second round of antibiotics even. She does have several vascular risk factors. She had a recent CT and has some white matter changes and vascular changes on CT scan and prior MRI testing was also in keeping with atrophy and moderate white  matter changes. I would like to proceed with additional testing in the form of EEG and brain MRI without contrast. She does have impaired kidney function, therefore we will do a noncontrast brain MRI. She is advised to stay better hydrated with water. I do not suggest a dementia medication at this time. I do recommend that she have a follow-up appointment with her ophthalmologist, it is possible that she has visual hallucinations secondary to underlying eye disease (such as Sherran Needs Syndrome). We will call him with the test results as far as the EEG and brain MRI and I will see her back in follow-up after testing. As far as the Seroquel, I am not quite sure what to suggest, there is a potential concern given her age and utilizing a antipsychotic medication and I had a very frank discussion with the daughter as well about this. She has filled the medication and if needed should try it with caution. I answered all their questions today and the patient and her daughter were in agreement. Thank you very much for allowing me to participate in the care of this nice patient. If I can be of any further assistance to you please do not hesitate to call me at [phone].  Sincerely,   Star Age, MD, PhD

## 2018-05-18 NOTE — Telephone Encounter (Signed)
Medicare order sent to GI. Patient daughter is aware and she has their number and if she has not heard in the next 2-3 business days they will contact them.

## 2018-05-18 NOTE — Patient Instructions (Addendum)
Your hallucinations may be due to mild dementia, also from a recent UTI, also from an eye related problems.   Here is what I suggest:   1. We will do an EEG (brainwave test), which we will schedule. We will call you with the results. 2. We will do a brain scan, called MRI (without contrast due to mildly impaired kidney function) and call you with the test results. We will have to schedule you for this on a separate date. This test requires authorization from your insurance, and we will take care of the insurance process. 3. Please make an appointment with your ophthalmologist.  4. I am not sure if I would recommend the seroquel at this point, especially, as the hallucinations are not so scary.

## 2018-05-18 NOTE — Patient Instructions (Signed)
Per Dr. Larose Kells patient to skip Coumadin 2 days the resume at 2 mg daily. Return for INR check in 2 weeks.

## 2018-05-18 NOTE — Progress Notes (Addendum)
Pre visit review using our clinic tool,if applicable. No additional management support is needed unless otherwise documented below in the visit note.   Pt here for INR check per order from Dr. Larose Kells.  Goal INR = 2.0-3.0  -Last INR =-3.1  Pt currently-takes Coumadin 2.5 mg, 5 mg   5 mg on Tuesdays and 2.5 mg all other days.  Pt denies recent antibiotics, no dietary changes and no unusual bruising / bleeding.  INR today = 3.9  Pt advised per Dr. Larose Kells to skip Coumadin for 2 days, Start Coumadin 2 mg daily and return in 2 weeks for INR re-check.  Kathlene November, MD

## 2018-05-18 NOTE — Telephone Encounter (Signed)
Error

## 2018-05-24 ENCOUNTER — Telehealth: Payer: Self-pay

## 2018-05-24 NOTE — Telephone Encounter (Signed)
[name], will you please schedule her for nurse visit 30 min INR check. It wont let anyone at the [facility] or me schedule her.

## 2018-05-24 NOTE — Telephone Encounter (Signed)
Copied from [place] [id number]. Topic: Appointment Scheduling - Scheduling Inquiry for Clinic >> May 24, 2018 11:15 AM Lennox Solders wrote: Reason for CRM: pt daughter sharon would like to bring her mom for INR on 06-01-18. Please call sharon at [phone]

## 2018-05-24 NOTE — Telephone Encounter (Signed)
Pt scheduled in Nov

## 2018-06-01 ENCOUNTER — Inpatient Hospital Stay (HOSPITAL_BASED_OUTPATIENT_CLINIC_OR_DEPARTMENT_OTHER)
Admission: EM | Admit: 2018-06-01 | Discharge: 2018-06-06 | DRG: 689 | Disposition: A | Discharge status: Skilled Nursing Facility | Payer: Medicare Other | Attending: Student | Admitting: Student

## 2018-06-01 ENCOUNTER — Encounter (HOSPITAL_BASED_OUTPATIENT_CLINIC_OR_DEPARTMENT_OTHER): Payer: Self-pay | Admitting: Emergency Medicine

## 2018-06-01 ENCOUNTER — Other Ambulatory Visit: Payer: Self-pay

## 2018-06-01 ENCOUNTER — Emergency Department (HOSPITAL_BASED_OUTPATIENT_CLINIC_OR_DEPARTMENT_OTHER): Payer: Medicare Other

## 2018-06-01 ENCOUNTER — Ambulatory Visit: Payer: Medicare Other

## 2018-06-01 DIAGNOSIS — R945 Abnormal results of liver function studies: Secondary | ICD-10-CM | POA: Diagnosis present

## 2018-06-01 DIAGNOSIS — I482 Chronic atrial fibrillation, unspecified: Secondary | ICD-10-CM | POA: Diagnosis present

## 2018-06-01 DIAGNOSIS — I130 Hypertensive heart and chronic kidney disease with heart failure and stage 1 through stage 4 chronic kidney disease, or unspecified chronic kidney disease: Secondary | ICD-10-CM | POA: Diagnosis present

## 2018-06-01 DIAGNOSIS — B9620 Unspecified Escherichia coli [E. coli] as the cause of diseases classified elsewhere: Secondary | ICD-10-CM | POA: Diagnosis present

## 2018-06-01 DIAGNOSIS — E870 Hyperosmolality and hypernatremia: Secondary | ICD-10-CM | POA: Diagnosis not present

## 2018-06-01 DIAGNOSIS — N179 Acute kidney failure, unspecified: Secondary | ICD-10-CM | POA: Diagnosis not present

## 2018-06-01 DIAGNOSIS — Z90722 Acquired absence of ovaries, bilateral: Secondary | ICD-10-CM | POA: Diagnosis not present

## 2018-06-01 DIAGNOSIS — G9341 Metabolic encephalopathy: Secondary | ICD-10-CM | POA: Diagnosis present

## 2018-06-01 DIAGNOSIS — W19XXXA Unspecified fall, initial encounter: Secondary | ICD-10-CM

## 2018-06-01 DIAGNOSIS — M6282 Rhabdomyolysis: Secondary | ICD-10-CM | POA: Diagnosis not present

## 2018-06-01 DIAGNOSIS — D696 Thrombocytopenia, unspecified: Secondary | ICD-10-CM | POA: Diagnosis not present

## 2018-06-01 DIAGNOSIS — R296 Repeated falls: Secondary | ICD-10-CM | POA: Diagnosis present

## 2018-06-01 DIAGNOSIS — Z79899 Other long term (current) drug therapy: Secondary | ICD-10-CM | POA: Diagnosis not present

## 2018-06-01 DIAGNOSIS — I10 Essential (primary) hypertension: Secondary | ICD-10-CM | POA: Diagnosis present

## 2018-06-01 DIAGNOSIS — R7989 Other specified abnormal findings of blood chemistry: Secondary | ICD-10-CM | POA: Diagnosis present

## 2018-06-01 DIAGNOSIS — Z7901 Long term (current) use of anticoagulants: Secondary | ICD-10-CM | POA: Diagnosis not present

## 2018-06-01 DIAGNOSIS — W1830XA Fall on same level, unspecified, initial encounter: Secondary | ICD-10-CM | POA: Diagnosis present

## 2018-06-01 DIAGNOSIS — I272 Pulmonary hypertension, unspecified: Secondary | ICD-10-CM | POA: Diagnosis present

## 2018-06-01 DIAGNOSIS — E861 Hypovolemia: Secondary | ICD-10-CM | POA: Diagnosis present

## 2018-06-01 DIAGNOSIS — Z96642 Presence of left artificial hip joint: Secondary | ICD-10-CM | POA: Diagnosis present

## 2018-06-01 DIAGNOSIS — Z90710 Acquired absence of both cervix and uterus: Secondary | ICD-10-CM

## 2018-06-01 DIAGNOSIS — N390 Urinary tract infection, site not specified: Secondary | ICD-10-CM | POA: Diagnosis not present

## 2018-06-01 DIAGNOSIS — R443 Hallucinations, unspecified: Secondary | ICD-10-CM | POA: Diagnosis present

## 2018-06-01 DIAGNOSIS — Z7982 Long term (current) use of aspirin: Secondary | ICD-10-CM

## 2018-06-01 DIAGNOSIS — Z86718 Personal history of other venous thrombosis and embolism: Secondary | ICD-10-CM | POA: Diagnosis not present

## 2018-06-01 DIAGNOSIS — Z8673 Personal history of transient ischemic attack (TIA), and cerebral infarction without residual deficits: Secondary | ICD-10-CM

## 2018-06-01 DIAGNOSIS — N183 Chronic kidney disease, stage 3 (moderate): Secondary | ICD-10-CM | POA: Diagnosis present

## 2018-06-01 DIAGNOSIS — R52 Pain, unspecified: Secondary | ICD-10-CM | POA: Diagnosis not present

## 2018-06-01 DIAGNOSIS — S199XXA Unspecified injury of neck, initial encounter: Secondary | ICD-10-CM | POA: Diagnosis not present

## 2018-06-01 DIAGNOSIS — R531 Weakness: Secondary | ICD-10-CM

## 2018-06-01 DIAGNOSIS — Z86711 Personal history of pulmonary embolism: Secondary | ICD-10-CM | POA: Diagnosis present

## 2018-06-01 DIAGNOSIS — M5489 Other dorsalgia: Secondary | ICD-10-CM | POA: Diagnosis not present

## 2018-06-01 DIAGNOSIS — E785 Hyperlipidemia, unspecified: Secondary | ICD-10-CM | POA: Diagnosis not present

## 2018-06-01 DIAGNOSIS — Z85038 Personal history of other malignant neoplasm of large intestine: Secondary | ICD-10-CM | POA: Diagnosis not present

## 2018-06-01 DIAGNOSIS — E860 Dehydration: Secondary | ICD-10-CM | POA: Diagnosis present

## 2018-06-01 DIAGNOSIS — I5030 Unspecified diastolic (congestive) heart failure: Secondary | ICD-10-CM | POA: Diagnosis not present

## 2018-06-01 DIAGNOSIS — S0990XA Unspecified injury of head, initial encounter: Secondary | ICD-10-CM | POA: Diagnosis not present

## 2018-06-01 DIAGNOSIS — Y92009 Unspecified place in unspecified non-institutional (private) residence as the place of occurrence of the external cause: Secondary | ICD-10-CM

## 2018-06-01 LAB — URINALYSIS, ROUTINE W REFLEX MICROSCOPIC
Bilirubin Urine: NEGATIVE
Glucose, UA: NEGATIVE mg/dL
Ketones, ur: NEGATIVE mg/dL
Nitrite: POSITIVE — AB
PH: 6.5 (ref 5.0–8.0)
Protein, ur: NEGATIVE mg/dL
Specific Gravity, Urine: 1.01 (ref 1.005–1.030)

## 2018-06-01 LAB — CBC WITH DIFFERENTIAL/PLATELET
ABS IMMATURE GRANULOCYTES: 0.02 10*3/uL (ref 0.00–0.07)
Basophils Absolute: 0 10*3/uL (ref 0.0–0.1)
Basophils Relative: 0 %
Eosinophils Absolute: 0.2 10*3/uL (ref 0.0–0.5)
Eosinophils Relative: 2 %
HEMATOCRIT: 43.5 % (ref 36.0–46.0)
Hemoglobin: 13.8 g/dL (ref 12.0–15.0)
IMMATURE GRANULOCYTES: 0 %
LYMPHS ABS: 0.8 10*3/uL (ref 0.7–4.0)
LYMPHS PCT: 11 %
MCH: 31.1 pg (ref 26.0–34.0)
MCHC: 31.7 g/dL (ref 30.0–36.0)
MCV: 98 fL (ref 80.0–100.0)
MONOS PCT: 9 %
Monocytes Absolute: 0.6 10*3/uL (ref 0.1–1.0)
Neutro Abs: 5.7 10*3/uL (ref 1.7–7.7)
Neutrophils Relative %: 78 %
Platelets: 63 10*3/uL — ABNORMAL LOW (ref 150–400)
RBC: 4.44 MIL/uL (ref 3.87–5.11)
RDW: 13.4 % (ref 11.5–15.5)
WBC: 7.3 10*3/uL (ref 4.0–10.5)
nRBC: 0 % (ref 0.0–0.2)

## 2018-06-01 LAB — COMPREHENSIVE METABOLIC PANEL
ALT: 23 U/L (ref 0–44)
AST: 64 U/L — AB (ref 15–41)
Albumin: 4.5 g/dL (ref 3.5–5.0)
Alkaline Phosphatase: 39 U/L (ref 38–126)
Anion gap: 9 (ref 5–15)
BILIRUBIN TOTAL: 2 mg/dL — AB (ref 0.3–1.2)
BUN: 60 mg/dL — AB (ref 8–23)
CO2: 25 mmol/L (ref 22–32)
CREATININE: 2.12 mg/dL — AB (ref 0.44–1.00)
Calcium: 9.6 mg/dL (ref 8.9–10.3)
Chloride: 109 mmol/L (ref 98–111)
GFR, EST AFRICAN AMERICAN: 23 mL/min — AB (ref >60)
GFR, EST NON AFRICAN AMERICAN: 20 mL/min — AB (ref >60)
Glucose, Bld: 110 mg/dL — ABNORMAL HIGH (ref 70–99)
Potassium: 4 mmol/L (ref 3.5–5.1)
Sodium: 143 mmol/L (ref 135–145)
TOTAL PROTEIN: 6.7 g/dL (ref 6.5–8.1)

## 2018-06-01 LAB — URINALYSIS, MICROSCOPIC (REFLEX): Squamous Epithelial / LPF: NONE SEEN (ref 0–5)

## 2018-06-01 LAB — TROPONIN I: Troponin I: <0.03 ng/mL (ref <0.03)

## 2018-06-01 MED ORDER — SODIUM CHLORIDE 0.9 % IV SOLN
1.0000 g | Freq: Once | INTRAVENOUS | Status: AC
  Administered 2018-06-01: 1 g via INTRAVENOUS
  Filled 2018-06-01: qty 10

## 2018-06-01 MED ORDER — SODIUM CHLORIDE 0.9 % IV SOLN
INTRAVENOUS | Status: DC | PRN
  Administered 2018-06-01: 250 mL via INTRAVENOUS

## 2018-06-01 MED ORDER — SODIUM CHLORIDE 0.9 % IV BOLUS
1000.0000 mL | Freq: Once | INTRAVENOUS | Status: AC
  Administered 2018-06-01: 1000 mL via INTRAVENOUS

## 2018-06-01 NOTE — ED Provider Notes (Signed)
[facility] EMERGENCY DEPARTMENT Provider Note   CSN: [id number] Arrival date & time: 06/01/18  1639     History   Chief Complaint Chief Complaint  Patient presents with  . Fall    HPI [name] is a 84 y.o. female.  84 year old female with past medical history including atrial fibrillation on Coumadin, DVT/PE, CVA, seizures, hypertension, memory problems who presents with falls.  Patient lives at home with her daughter and daughter states that she has had 3 different falls over the past 3 days.  Daughter has found her on the ground and patient is not sure why she fell but remembers sliding to the ground.  When EMS picked her up today, she was initially having some pain at the bottom of her sternum but this pain has gotten better.  Daughter is not sure whether or that pain was from the fall or from daughter trying to lift her up afterwards.  She has not had any fevers or recent illness but daughter notes ongoing issues with hallucinations for which they have been evaluated by neurology and are scheduled for an upcoming MRI.  Currently patient denies any focal areas of pain.  LEVEL 5 CAVEAT DUE TO MEMORY PROBLEMS  The history is provided by the patient and a relative.  Fall     Past Medical History:  Diagnosis Date  . Acute encephalopathy   . Acute renal failure superimposed on stage 3 chronic kidney disease ([place])   . Anemia   . Atrial fibrillation ([place])   . Colon cancer ([place]) 12/11  . Colon cancer ([place]) 2011  . Diarrhea   . DVT (deep venous thrombosis) ([place]) 11/06  . Elevated brain natriuretic peptide (BNP) level   . Frequent headaches   . Heart murmur   . Hyperkalemia   . Hypertension   . Pulmonary embolism ([place]) 11/06  . Seizures ([place])   . Stroke [facility]) 11/30/2014    Patient Active Problem List   Diagnosis Date Noted  . Fall 06/01/2018  . Abnormal LFTs 06/01/2018  . Hallucination 06/01/2018  . Abnormal chest x-ray 09/28/2015  . Edema of right lower  extremity 09/28/2015  . Chronic atrial fibrillation 09/28/2015  . Elevated brain natriuretic peptide (BNP) level 09/17/2015  . Acute renal failure superimposed on stage 3 chronic kidney disease ([place]) 09/17/2015  . Seizures ([place]) 09/16/2015  . Epistaxis 09/07/2015  . Asthma 08/14/2015  . PCP NOTES >>>>>>>>>>>>>>>>>>>>>>>>>>>>>> 04/24/2015  . TIA (transient ischemic attack) 12/11/2014  . Dyslipidemia 12/11/2014  . History of CVA (cerebrovascular accident) 12/01/2014  . Thrombocytopenia ([place]) 12/01/2014  . Chronic renal insufficiency, stage II (mild) 11/12/2013  . Impingement syndrome, shoulder, left 11/09/2013  . Acquired short bowel syndrome 11/09/2013  . Annual physical exam 08/30/2013  . Hx pulmonary embolism 06/27/2013  . Warfarin anticoagulation 06/27/2013  . DDD (degenerative disc disease), lumbar 11/18/2012  . Mitral regurgitation 08/26/2011  . DYSPHAGIA UNSPECIFIED 07/31/2010  . ATRIAL FIBRILLATION   07/08/2010  . COLON CANCER, HX OF 07/08/2010  . DIZZINESS 10/18/2009  . Essential hypertension 12/16/2007  . HIP PAIN, CHRONIC 12/28/2006  . ANEMIA-NOS 07/15/2006  . Personal history of venous thrombosis and embolism 07/15/2006    Past Surgical History:  Procedure Laterality Date  . APPENDECTOMY  1952  . COLECTOMY  06/2010   partial, Dr. Donne Hazel complicated by LLE CVT; S/P IVC umbrella & anemia  . HIP FRACTURE SURGERY  2006   Trauma  . TOTAL ABDOMINAL HYSTERECTOMY W/ BILATERAL SALPINGOOPHORECTOMY  1973   For  Fibroids  . TOTAL HIP ARTHROPLASTY  01/03/07   Left hip replacement.  Marland Kitchen VENA CAVA FILTER PLACEMENT  06/28/2010     OB History   None      Home Medications    Prior to Admission medications   Medication Sig Start Date End Date Taking? Authorizing Provider  acetaminophen (TYLENOL) 500 MG tablet Take 500 mg by mouth every 8 (eight) hours as needed for mild pain.     [provider]  ADVAIR DISKUS 100-50 MCG/DOSE AEPB Inhale 1 puff into the lungs 2  (two) times daily. 01/03/18   Colon Branch, MD  albuterol (PROVENTIL) (2.5 MG/3ML) 0.083% nebulizer solution Take 3 mLs (2.5 mg total) by nebulization 3 (three) times daily as needed for wheezing or shortness of breath. 05/03/17   Colon Branch, MD  aspirin 81 MG tablet Take 81 mg by mouth daily.    [provider]  atorvastatin (LIPITOR) 20 MG tablet Take 1 tablet (20 mg total) by mouth daily. 10/14/17   Colon Branch, MD  Calcium Carbonate-Vit D-Min (CALCIUM 1200 PO) Take 2 tablets by mouth daily.     [provider]  furosemide (LASIX) 40 MG tablet Take 1 tablet (40 mg total) by mouth daily. 11/22/17   Burnell Blanks, MD  loperamide (IMODIUM A-D) 2 MG tablet Give 2 mg by mouth with first loose stool. Give 2 mg by mouth for additional loose stool.    [provider]  loratadine (CLARITIN) 10 MG tablet Take 10 mg by mouth daily.    [provider]  metoprolol tartrate (LOPRESSOR) 25 MG tablet Take 1.5 tablets (37.5 mg total) by mouth 2 (two) times daily. 04/27/18   Colon Branch, MD  Multiple Vitamins-Minerals (MULTIPLE VITAMINS/WOMENS PO) Take by mouth daily.    [provider]  PROAIR HFA 108 [phone] Base) MCG/ACT inhaler Inhale 2 puffs into the lungs every 6 (six) hours as needed for wheezing or shortness of breath. 01/24/18   Colon Branch, MD  promethazine (PHENERGAN) 12.5 MG tablet Take 1 tablet (12.5 mg total) by mouth 3 (three) times daily as needed for nausea or vomiting. 10/21/17   Colon Branch, MD  QUEtiapine (SEROQUEL) 25 MG tablet Take 1-2 tablets (25-50 mg total) by mouth at bedtime. Patient not taking: Reported on 05/18/2018 03/16/18   Colon Branch, MD  sulfamethoxazole-trimethoprim (BACTRIM DS,SEPTRA DS) 800-160 MG tablet Take 1 tablet by mouth 2 (two) times daily. 03/11/18   Colon Branch, MD  traMADol (ULTRAM) 50 MG tablet Take 1 tablet (50 mg total) by mouth every 6 (six) hours as needed for moderate pain. 06/25/16   Colon Branch, MD  verapamil (CALAN)  120 MG tablet Take 1 tablet (120 mg total) by mouth every evening. 02/02/18   Colon Branch, MD  warfarin (COUMADIN) 2 MG tablet Take 1 tablet (2 mg total) by mouth daily. 05/18/18   Colon Branch, MD  warfarin (COUMADIN) 2.5 MG tablet USE AS DIRECTED BY COUMADIN CLINIC 03/03/18   Colon Branch, MD    Family History Family History  Problem Relation Age of Onset  . Peripheral vascular disease Father   . Heart attack Mother 31  . Heart disease Mother        MI  . Coronary artery disease Sister   . Diabetes Paternal Grandmother   . Coronary artery disease Maternal Grandfather     Social History Social History   Tobacco Use  . Smoking status:  Passive Smoke Exposure - Never Smoker  . Smokeless tobacco: Never Used  . Tobacco comment: husband smoked in home.   Substance Use Topics  . Alcohol use: No    Alcohol/week: 0.0 standard drinks  . Drug use: No     Allergies   Patient has no known allergies.   Review of Systems Review of Systems  Unable to perform ROS: Other  memory problems   Physical Exam Updated Vital Signs BP 135/69   Pulse (!) 109   Temp 98.1 F (36.7 C)   Resp 16   SpO2 94%   Physical Exam  Constitutional: She is oriented to person, place, and time. She appears well-developed and well-nourished. No distress.  HENT:  Head: Normocephalic and atraumatic.  Moist mucous membranes  Eyes: Pupils are equal, round, and reactive to light. Conjunctivae are normal.  Neck: Neck supple.  No midline spinal tenderness  Cardiovascular: Normal rate, regular rhythm and normal heart sounds.  No murmur heard. Pulmonary/Chest: Effort normal and breath sounds normal. She exhibits tenderness (mild tenderness at lower sternum).  Abdominal: Soft. Bowel sounds are normal. She exhibits no distension. There is no tenderness.  Musculoskeletal: Normal range of motion. She exhibits no edema or tenderness.  Neurological: She is alert and oriented to person, place, and time.  Fluent  speech  Skin: Skin is warm and dry.  Healing ecchymoses R forearm; candidal skin infection under breasts and on lower chest wall b/l  Psychiatric: She has a normal mood and affect.  Nursing note and vitals reviewed.    ED Treatments / Results  Labs (all labs ordered are listed, but only abnormal results are displayed) Labs Reviewed  COMPREHENSIVE METABOLIC PANEL - Abnormal; Notable for the following components:      Result Value   Glucose, Bld 110 (*)    BUN 60 (*)    Creatinine, Ser 2.12 (*)    AST 64 (*)    Total Bilirubin 2.0 (*)    GFR calc non Af Amer 20 (*)    GFR calc Af Amer 23 (*)    All other components within normal limits  CBC WITH DIFFERENTIAL/PLATELET - Abnormal; Notable for the following components:   Platelets 63 (*)    All other components within normal limits  URINALYSIS, ROUTINE W REFLEX MICROSCOPIC - Abnormal; Notable for the following components:   Hgb urine dipstick TRACE (*)    Nitrite POSITIVE (*)    Leukocytes, UA TRACE (*)    All other components within normal limits  URINALYSIS, MICROSCOPIC (REFLEX) - Abnormal; Notable for the following components:   Bacteria, UA MANY (*)    All other components within normal limits  URINE CULTURE  TROPONIN I    EKG EKG Interpretation  Date/Time:  Wednesday June 01 2018 17:20:11 EST Ventricular Rate:  98 PR Interval:    QRS Duration: 110 QT Interval:  381 QTC Calculation: 487 R Axis:   -62 Text Interpretation:  Atrial fibrillation Left anterior fascicular block Abnormal R-wave progression, late transition LVH with secondary repolarization abnormality Borderline prolonged QT interval rate faster,otherwise similar to previous Confirmed by Theotis Burrow [phone]) on 06/01/2018 5:23:54 PM   Radiology Dg Chest 2 View  Result Date: 06/01/2018 CLINICAL DATA:  Multiple falls EXAM: CHEST - 2 VIEW COMPARISON:  08/16/2017 FINDINGS: Mild cardiomegaly. No acute opacity or pleural effusion. No pneumothorax.  Degenerative changes of the spine. IMPRESSION: No active cardiopulmonary disease.  Mild cardiomegaly. Electronically Signed   By: Donavan Foil M.D.   On:  06/01/2018 18:31   Ct Head Wo Contrast  Result Date: 06/01/2018 CLINICAL DATA:  Multiple recent falls. Reported head injury. History of CVA x2 and colon cancer. EXAM: CT HEAD WITHOUT CONTRAST CT CERVICAL SPINE WITHOUT CONTRAST TECHNIQUE: Multidetector CT imaging of the head and cervical spine was performed following the standard protocol without intravenous contrast. Multiplanar CT image reconstructions of the cervical spine were also generated. COMPARISON:  09/16/2015 head CT. FINDINGS: CT HEAD FINDINGS Brain: No evidence of parenchymal hemorrhage or extra-axial fluid collection. Stable posterior fossa cyst asymmetric to the left along posterior cerebellum compatible with arachnoid cyst. No new mass lesion, mass effect, or midline shift. No CT evidence of acute infarction. Generalized cerebral volume loss. Nonspecific moderate subcortical and periventricular white matter hypodensity, most in keeping with chronic small vessel ischemic change. No ventriculomegaly. Vascular: No acute abnormality. Skull: No evidence of calvarial fracture. Sinuses/Orbits: The visualized paranasal sinuses are essentially clear. Other:  The mastoid air cells are unopacified. CT CERVICAL SPINE FINDINGS Alignment: Exaggerated thoracic kyphosis with normal cervical lordosis. No facet subluxation. Dens is well positioned between the lateral masses of C1. Mild 3 mm anterolisthesis C4-5 and C5-6. Skull base and vertebrae: No acute fracture. No primary bone lesion or focal pathologic process. Soft tissues and spinal canal: No prevertebral edema. No visible canal hematoma. Disc levels: Moderate to severe multilevel degenerative disc disease in the cervical spine, most prominent at C6-7. Marked bilateral facet arthropathy. Mild degenerative foraminal stenosis bilaterally at C3-4 and C4-5.  Upper chest: No acute abnormality. Tiny calcified posterior right upper lobe granuloma. Other: Visualized mastoid air cells appear clear. No discrete thyroid nodules. No pathologically enlarged cervical nodes. IMPRESSION: CT HEAD: 1. No evidence of acute intracranial abnormality. No evidence of calvarial fracture. 2. Generalized cerebral volume loss and moderate chronic small vessel ischemic changes in the cerebral white matter. CT CERVICAL SPINE: 1. No cervical spine fracture or facet subluxation. 2. Moderate to severe multilevel cervical degenerative changes as detailed. Electronically Signed   By: Ilona Sorrel M.D.   On: 06/01/2018 18:37   Ct Cervical Spine Wo Contrast  Result Date: 06/01/2018 CLINICAL DATA:  Multiple recent falls. Reported head injury. History of CVA x2 and colon cancer. EXAM: CT HEAD WITHOUT CONTRAST CT CERVICAL SPINE WITHOUT CONTRAST TECHNIQUE: Multidetector CT imaging of the head and cervical spine was performed following the standard protocol without intravenous contrast. Multiplanar CT image reconstructions of the cervical spine were also generated. COMPARISON:  09/16/2015 head CT. FINDINGS: CT HEAD FINDINGS Brain: No evidence of parenchymal hemorrhage or extra-axial fluid collection. Stable posterior fossa cyst asymmetric to the left along posterior cerebellum compatible with arachnoid cyst. No new mass lesion, mass effect, or midline shift. No CT evidence of acute infarction. Generalized cerebral volume loss. Nonspecific moderate subcortical and periventricular white matter hypodensity, most in keeping with chronic small vessel ischemic change. No ventriculomegaly. Vascular: No acute abnormality. Skull: No evidence of calvarial fracture. Sinuses/Orbits: The visualized paranasal sinuses are essentially clear. Other:  The mastoid air cells are unopacified. CT CERVICAL SPINE FINDINGS Alignment: Exaggerated thoracic kyphosis with normal cervical lordosis. No facet subluxation. Dens is  well positioned between the lateral masses of C1. Mild 3 mm anterolisthesis C4-5 and C5-6. Skull base and vertebrae: No acute fracture. No primary bone lesion or focal pathologic process. Soft tissues and spinal canal: No prevertebral edema. No visible canal hematoma. Disc levels: Moderate to severe multilevel degenerative disc disease in the cervical spine, most prominent at C6-7. Marked bilateral facet arthropathy. Mild degenerative foraminal  stenosis bilaterally at C3-4 and C4-5. Upper chest: No acute abnormality. Tiny calcified posterior right upper lobe granuloma. Other: Visualized mastoid air cells appear clear. No discrete thyroid nodules. No pathologically enlarged cervical nodes. IMPRESSION: CT HEAD: 1. No evidence of acute intracranial abnormality. No evidence of calvarial fracture. 2. Generalized cerebral volume loss and moderate chronic small vessel ischemic changes in the cerebral white matter. CT CERVICAL SPINE: 1. No cervical spine fracture or facet subluxation. 2. Moderate to severe multilevel cervical degenerative changes as detailed. Electronically Signed   By: Ilona Sorrel M.D.   On: 06/01/2018 18:37    Procedures Procedures (including critical care time)  Medications Ordered in ED Medications  0.9 %  sodium chloride infusion ( Intravenous Stopped 06/01/18 2256)  cefTRIAXone (ROCEPHIN) 1 g in sodium chloride 0.9 % 100 mL IVPB ( Intravenous Stopped 06/01/18 2024)  sodium chloride 0.9 % bolus 1,000 mL ( Intravenous Stopped 06/01/18 2052)     Initial Impression / Assessment and Plan / ED Course  I have reviewed the triage vital signs and the nursing notes.  Pertinent labs & imaging results that were available during my care of the patient were reviewed by me and considered in my medical decision making (see chart for details).    PT comfortable on exam, stable VS. No complaints of pain.  CT head and C-spine as well as chest x-ray negative for acute injury.  Lab work shows PLT 63  which is lower than previous, Cr. 2.12 which is elevated from baseline around 1.4.  BUN 60.  UA is consistent with infection.  Added urine culture and gave ceftriaxone as well as IV fluid bolus for her AKI.  Staff got patient up to try to ambulate her and she was unable to even stand up. Daughter notes she has had this significant generalized weakness for several days now. It is possible that her weakness is multifactorial related to dehydration as well as UTI.  Recommended admission.  Discussed with Dr. Blaine Hamper and pt will be transferred to [facility] for further care.  Final Clinical Impressions(s) / ED Diagnoses   Final diagnoses:  Urinary tract infection without hematuria, site unspecified  AKI (acute kidney injury) ([place])  Thrombocytopenia ([place])  Fall in home, initial encounter  Generalized weakness    ED Discharge Orders    None       Jazmene Racz, Wenda Overland, MD 06/02/18 [phone]

## 2018-06-01 NOTE — ED Triage Notes (Signed)
EMS transport from home. States has had 3 falls at home since Sunday. Lives at home with daughter. No obvious deformity, pt states slid off of foot stool PTA.

## 2018-06-01 NOTE — ED Notes (Signed)
ED Provider at bedside. 

## 2018-06-01 NOTE — ED Notes (Signed)
Patient transported to CT 

## 2018-06-01 NOTE — ED Notes (Signed)
Bluish bruises noted on lower back area. Skin intact.

## 2018-06-01 NOTE — ED Notes (Signed)
Attempted to ambulate pt with 2 assist and walker, pt unable to bear weight. MD informed

## 2018-06-01 NOTE — Care Management (Signed)
This is a no charge note  Transfer from [facility] per Dr. Rex Kras  84 year old lady with a past medical history of hypertension, hyperlipidemia, stroke, depression, thrombocytopenia, CKD 3, atrial fibrillation on coumadin, colon cancer, DVT/PE on Coumadin, seizure, CKD 3, who presents with generalized weakness and fall.  Patient has 3 falls since Sunday.  Patient also has ongoing visual hallucination issue, and and is following up with neurology.  Patient was found to have questionable UTI (clear appearance, trace amount of leukocyte, many bacteria, WBC 0-5, positive nitrite), WBC 7.3, negative troponin, worsening renal function, mildly abnormal liver function (ALP 39, AST 64, ALT 23, total bilirubin 2.0), chest x-ray is negative for infiltration, but showed mild cardiomegaly.  CT head is negative for acute intracranial abnormalities.  CT of C-spine is negative for acute bony fracture.  Temperature normal, slightly tachycardia, no tachypnea, oxygen satting 96%.  Patient is accepted to telemetry bed for observation.  Rocephin was started in ED.   Please call manager of [facility] at [phone] when pt arrives to floor   Ivor Costa, MD  [facility] Pager [phone]  If 7PM-7AM, please contact night-coverage [email or web address] Password [facility] 06/01/2018, 11:46 PM

## 2018-06-02 ENCOUNTER — Observation Stay (HOSPITAL_COMMUNITY): Payer: Medicare Other

## 2018-06-02 ENCOUNTER — Encounter (HOSPITAL_COMMUNITY): Payer: Self-pay | Admitting: *Deleted

## 2018-06-02 DIAGNOSIS — Z7901 Long term (current) use of anticoagulants: Secondary | ICD-10-CM | POA: Diagnosis not present

## 2018-06-02 DIAGNOSIS — R531 Weakness: Secondary | ICD-10-CM

## 2018-06-02 DIAGNOSIS — I69828 Other speech and language deficits following other cerebrovascular disease: Secondary | ICD-10-CM | POA: Diagnosis not present

## 2018-06-02 DIAGNOSIS — Z7982 Long term (current) use of aspirin: Secondary | ICD-10-CM | POA: Diagnosis not present

## 2018-06-02 DIAGNOSIS — E870 Hyperosmolality and hypernatremia: Secondary | ICD-10-CM | POA: Diagnosis present

## 2018-06-02 DIAGNOSIS — Z8673 Personal history of transient ischemic attack (TIA), and cerebral infarction without residual deficits: Secondary | ICD-10-CM

## 2018-06-02 DIAGNOSIS — W1830XA Fall on same level, unspecified, initial encounter: Secondary | ICD-10-CM | POA: Diagnosis present

## 2018-06-02 DIAGNOSIS — Z86711 Personal history of pulmonary embolism: Secondary | ICD-10-CM | POA: Diagnosis not present

## 2018-06-02 DIAGNOSIS — I482 Chronic atrial fibrillation, unspecified: Secondary | ICD-10-CM

## 2018-06-02 DIAGNOSIS — S299XXA Unspecified injury of thorax, initial encounter: Secondary | ICD-10-CM | POA: Diagnosis not present

## 2018-06-02 DIAGNOSIS — B9620 Unspecified Escherichia coli [E. coli] as the cause of diseases classified elsewhere: Secondary | ICD-10-CM | POA: Diagnosis not present

## 2018-06-02 DIAGNOSIS — J45909 Unspecified asthma, uncomplicated: Secondary | ICD-10-CM | POA: Diagnosis not present

## 2018-06-02 DIAGNOSIS — S79911A Unspecified injury of right hip, initial encounter: Secondary | ICD-10-CM | POA: Diagnosis not present

## 2018-06-02 DIAGNOSIS — R945 Abnormal results of liver function studies: Secondary | ICD-10-CM

## 2018-06-02 DIAGNOSIS — E860 Dehydration: Secondary | ICD-10-CM | POA: Diagnosis not present

## 2018-06-02 DIAGNOSIS — I10 Essential (primary) hypertension: Secondary | ICD-10-CM

## 2018-06-02 DIAGNOSIS — N183 Chronic kidney disease, stage 3 (moderate): Secondary | ICD-10-CM | POA: Diagnosis not present

## 2018-06-02 DIAGNOSIS — R296 Repeated falls: Secondary | ICD-10-CM | POA: Diagnosis present

## 2018-06-02 DIAGNOSIS — M2550 Pain in unspecified joint: Secondary | ICD-10-CM | POA: Diagnosis not present

## 2018-06-02 DIAGNOSIS — R443 Hallucinations, unspecified: Secondary | ICD-10-CM

## 2018-06-02 DIAGNOSIS — E861 Hypovolemia: Secondary | ICD-10-CM | POA: Diagnosis present

## 2018-06-02 DIAGNOSIS — E785 Hyperlipidemia, unspecified: Secondary | ICD-10-CM

## 2018-06-02 DIAGNOSIS — Z85038 Personal history of other malignant neoplasm of large intestine: Secondary | ICD-10-CM | POA: Diagnosis not present

## 2018-06-02 DIAGNOSIS — I5030 Unspecified diastolic (congestive) heart failure: Secondary | ICD-10-CM | POA: Diagnosis present

## 2018-06-02 DIAGNOSIS — N179 Acute kidney failure, unspecified: Secondary | ICD-10-CM

## 2018-06-02 DIAGNOSIS — D649 Anemia, unspecified: Secondary | ICD-10-CM | POA: Diagnosis not present

## 2018-06-02 DIAGNOSIS — W19XXXA Unspecified fall, initial encounter: Secondary | ICD-10-CM | POA: Diagnosis not present

## 2018-06-02 DIAGNOSIS — I272 Pulmonary hypertension, unspecified: Secondary | ICD-10-CM | POA: Diagnosis not present

## 2018-06-02 DIAGNOSIS — N390 Urinary tract infection, site not specified: Secondary | ICD-10-CM | POA: Diagnosis present

## 2018-06-02 DIAGNOSIS — M6281 Muscle weakness (generalized): Secondary | ICD-10-CM | POA: Diagnosis not present

## 2018-06-02 DIAGNOSIS — Z86718 Personal history of other venous thrombosis and embolism: Secondary | ICD-10-CM | POA: Diagnosis not present

## 2018-06-02 DIAGNOSIS — G9341 Metabolic encephalopathy: Secondary | ICD-10-CM | POA: Diagnosis not present

## 2018-06-02 DIAGNOSIS — R488 Other symbolic dysfunctions: Secondary | ICD-10-CM | POA: Diagnosis not present

## 2018-06-02 DIAGNOSIS — R29898 Other symptoms and signs involving the musculoskeletal system: Secondary | ICD-10-CM | POA: Diagnosis not present

## 2018-06-02 DIAGNOSIS — D696 Thrombocytopenia, unspecified: Secondary | ICD-10-CM | POA: Diagnosis not present

## 2018-06-02 DIAGNOSIS — M25551 Pain in right hip: Secondary | ICD-10-CM | POA: Diagnosis not present

## 2018-06-02 DIAGNOSIS — R569 Unspecified convulsions: Secondary | ICD-10-CM | POA: Diagnosis not present

## 2018-06-02 DIAGNOSIS — I69311 Memory deficit following cerebral infarction: Secondary | ICD-10-CM | POA: Diagnosis not present

## 2018-06-02 DIAGNOSIS — I130 Hypertensive heart and chronic kidney disease with heart failure and stage 1 through stage 4 chronic kidney disease, or unspecified chronic kidney disease: Secondary | ICD-10-CM | POA: Diagnosis present

## 2018-06-02 DIAGNOSIS — S8991XA Unspecified injury of right lower leg, initial encounter: Secondary | ICD-10-CM | POA: Diagnosis not present

## 2018-06-02 DIAGNOSIS — Z5181 Encounter for therapeutic drug level monitoring: Secondary | ICD-10-CM | POA: Diagnosis not present

## 2018-06-02 DIAGNOSIS — R0602 Shortness of breath: Secondary | ICD-10-CM | POA: Diagnosis not present

## 2018-06-02 DIAGNOSIS — M6282 Rhabdomyolysis: Secondary | ICD-10-CM | POA: Diagnosis present

## 2018-06-02 DIAGNOSIS — R2681 Unsteadiness on feet: Secondary | ICD-10-CM | POA: Diagnosis not present

## 2018-06-02 DIAGNOSIS — Z79899 Other long term (current) drug therapy: Secondary | ICD-10-CM | POA: Diagnosis not present

## 2018-06-02 DIAGNOSIS — M79604 Pain in right leg: Secondary | ICD-10-CM | POA: Diagnosis not present

## 2018-06-02 DIAGNOSIS — I5032 Chronic diastolic (congestive) heart failure: Secondary | ICD-10-CM | POA: Diagnosis not present

## 2018-06-02 DIAGNOSIS — Z9181 History of falling: Secondary | ICD-10-CM | POA: Diagnosis not present

## 2018-06-02 DIAGNOSIS — Z90722 Acquired absence of ovaries, bilateral: Secondary | ICD-10-CM | POA: Diagnosis not present

## 2018-06-02 DIAGNOSIS — I129 Hypertensive chronic kidney disease with stage 1 through stage 4 chronic kidney disease, or unspecified chronic kidney disease: Secondary | ICD-10-CM | POA: Diagnosis not present

## 2018-06-02 DIAGNOSIS — Z90710 Acquired absence of both cervix and uterus: Secondary | ICD-10-CM | POA: Diagnosis not present

## 2018-06-02 DIAGNOSIS — Z96642 Presence of left artificial hip joint: Secondary | ICD-10-CM | POA: Diagnosis present

## 2018-06-02 DIAGNOSIS — N3000 Acute cystitis without hematuria: Secondary | ICD-10-CM | POA: Diagnosis not present

## 2018-06-02 DIAGNOSIS — Z7401 Bed confinement status: Secondary | ICD-10-CM | POA: Diagnosis not present

## 2018-06-02 LAB — CBC
HCT: 38.5 % (ref 36.0–46.0)
HEMOGLOBIN: 12.3 g/dL (ref 12.0–15.0)
MCH: 30.8 pg (ref 26.0–34.0)
MCHC: 31.9 g/dL (ref 30.0–36.0)
MCV: 96.5 fL (ref 80.0–100.0)
Platelets: 58 10*3/uL — ABNORMAL LOW (ref 150–400)
RBC: 3.99 MIL/uL (ref 3.87–5.11)
RDW: 13.4 % (ref 11.5–15.5)
WBC: 5.9 10*3/uL (ref 4.0–10.5)
nRBC: 0 % (ref 0.0–0.2)

## 2018-06-02 LAB — BASIC METABOLIC PANEL
Anion gap: 8 (ref 5–15)
BUN: 50 mg/dL — ABNORMAL HIGH (ref 8–23)
CHLORIDE: 114 mmol/L — AB (ref 98–111)
CO2: 24 mmol/L (ref 22–32)
Calcium: 8.9 mg/dL (ref 8.9–10.3)
Creatinine, Ser: 1.87 mg/dL — ABNORMAL HIGH (ref 0.44–1.00)
GFR calc Af Amer: 27 mL/min — ABNORMAL LOW (ref >60)
GFR calc non Af Amer: 23 mL/min — ABNORMAL LOW (ref >60)
Glucose, Bld: 102 mg/dL — ABNORMAL HIGH (ref 70–99)
Potassium: 3.9 mmol/L (ref 3.5–5.1)
Sodium: 146 mmol/L — ABNORMAL HIGH (ref 135–145)

## 2018-06-02 LAB — CK: Total CK: 557 U/L — ABNORMAL HIGH (ref 38–234)

## 2018-06-02 LAB — PROTIME-INR
INR: 2.3
Prothrombin Time: 25 seconds — ABNORMAL HIGH (ref 11.4–15.2)

## 2018-06-02 MED ORDER — ONDANSETRON HCL 4 MG PO TABS: 4.0000 mg | ORAL_TABLET | Freq: Four times a day (QID) | ORAL | Status: DC | PRN

## 2018-06-02 MED ORDER — ACETAMINOPHEN 500 MG PO TABS: 500.0000 mg | ORAL_TABLET | Freq: Four times a day (QID) | ORAL | Status: DC | PRN

## 2018-06-02 MED ORDER — ASPIRIN EC 81 MG PO TBEC
81.0000 mg | DELAYED_RELEASE_TABLET | Freq: Every day | ORAL | Status: DC
  Administered 2018-06-02 – 2018-06-06 (×5): 81 mg via ORAL
  Filled 2018-06-02 (×5): qty 1

## 2018-06-02 MED ORDER — CALCIUM CARBONATE-VITAMIN D 500-200 MG-UNIT PO TABS
1.0000 | ORAL_TABLET | Freq: Every day | ORAL | Status: DC
  Administered 2018-06-02 – 2018-06-06 (×5): 1 via ORAL
  Filled 2018-06-02 (×5): qty 1

## 2018-06-02 MED ORDER — ALBUTEROL SULFATE (2.5 MG/3ML) 0.083% IN NEBU
2.5000 mg | INHALATION_SOLUTION | RESPIRATORY_TRACT | Status: DC | PRN
  Administered 2018-06-04: 2.5 mg via RESPIRATORY_TRACT
  Filled 2018-06-02: qty 3

## 2018-06-02 MED ORDER — LORATADINE 10 MG PO TABS
10.0000 mg | ORAL_TABLET | Freq: Every day | ORAL | Status: DC
  Administered 2018-06-02 – 2018-06-06 (×5): 10 mg via ORAL
  Filled 2018-06-02 (×5): qty 1

## 2018-06-02 MED ORDER — HALOPERIDOL LACTATE 5 MG/ML IJ SOLN
2.0000 mg | Freq: Once | INTRAMUSCULAR | Status: AC
  Administered 2018-06-02: 2 mg via INTRAVENOUS
  Filled 2018-06-02: qty 1

## 2018-06-02 MED ORDER — HYDRALAZINE HCL 20 MG/ML IJ SOLN: 5.0000 mg | INTRAMUSCULAR | Status: DC | PRN

## 2018-06-02 MED ORDER — TRAMADOL HCL 50 MG PO TABS
50.0000 mg | ORAL_TABLET | Freq: Four times a day (QID) | ORAL | Status: DC | PRN
  Administered 2018-06-04: 50 mg via ORAL
  Filled 2018-06-02: qty 1

## 2018-06-02 MED ORDER — ADULT MULTIVITAMIN W/MINERALS CH
1.0000 | ORAL_TABLET | Freq: Every day | ORAL | Status: DC
  Administered 2018-06-02 – 2018-06-06 (×5): 1 via ORAL
  Filled 2018-06-02 (×5): qty 1

## 2018-06-02 MED ORDER — SODIUM CHLORIDE 0.45 % IV SOLN
INTRAVENOUS | Status: DC
  Administered 2018-06-02 – 2018-06-03 (×2): via INTRAVENOUS

## 2018-06-02 MED ORDER — ONDANSETRON HCL 4 MG/2ML IJ SOLN: 4.0000 mg | Freq: Four times a day (QID) | INTRAMUSCULAR | Status: DC | PRN

## 2018-06-02 MED ORDER — SODIUM CHLORIDE 0.9 % IV SOLN
INTRAVENOUS | Status: DC
  Administered 2018-06-02: 07:00:00 via INTRAVENOUS

## 2018-06-02 MED ORDER — WARFARIN SODIUM 2 MG PO TABS
2.0000 mg | ORAL_TABLET | Freq: Once | ORAL | Status: AC
  Administered 2018-06-02: 2 mg via ORAL
  Filled 2018-06-02: qty 1

## 2018-06-02 MED ORDER — WARFARIN - PHARMACIST DOSING INPATIENT
Freq: Every day | Status: DC
  Administered 2018-06-02 – 2018-06-03 (×2)

## 2018-06-02 MED ORDER — METOPROLOL TARTRATE 25 MG PO TABS
37.5000 mg | ORAL_TABLET | Freq: Two times a day (BID) | ORAL | Status: DC
  Administered 2018-06-02 – 2018-06-06 (×10): 37.5 mg via ORAL
  Filled 2018-06-02 (×10): qty 1

## 2018-06-02 MED ORDER — MOMETASONE FURO-FORMOTEROL FUM 100-5 MCG/ACT IN AERO
2.0000 | INHALATION_SPRAY | Freq: Two times a day (BID) | RESPIRATORY_TRACT | Status: DC
  Administered 2018-06-02 – 2018-06-06 (×8): 2 via RESPIRATORY_TRACT
  Filled 2018-06-02: qty 8.8

## 2018-06-02 MED ORDER — LOPERAMIDE HCL 2 MG PO CAPS: 2.0000 mg | ORAL_CAPSULE | Freq: Two times a day (BID) | ORAL | Status: DC | PRN

## 2018-06-02 MED ORDER — SODIUM CHLORIDE 0.9 % IV SOLN
1.0000 g | INTRAVENOUS | Status: DC
  Administered 2018-06-02 – 2018-06-03 (×2): 1 g via INTRAVENOUS
  Filled 2018-06-02 (×3): qty 10

## 2018-06-02 MED ORDER — ATORVASTATIN CALCIUM 10 MG PO TABS
20.0000 mg | ORAL_TABLET | Freq: Every day | ORAL | Status: DC
  Administered 2018-06-02 – 2018-06-06 (×5): 20 mg via ORAL
  Filled 2018-06-02 (×5): qty 2

## 2018-06-02 MED ORDER — QUETIAPINE FUMARATE 25 MG PO TABS
25.0000 mg | ORAL_TABLET | Freq: Every day | ORAL | Status: DC
  Administered 2018-06-02 (×2): 25 mg via ORAL
  Administered 2018-06-03 – 2018-06-05 (×3): 50 mg via ORAL
  Filled 2018-06-02: qty 2
  Filled 2018-06-02 (×2): qty 1
  Filled 2018-06-02 (×2): qty 2

## 2018-06-02 MED ORDER — VERAPAMIL HCL 120 MG PO TABS
120.0000 mg | ORAL_TABLET | Freq: Every evening | ORAL | Status: DC
  Administered 2018-06-02 – 2018-06-05 (×4): 120 mg via ORAL
  Filled 2018-06-02 (×5): qty 1

## 2018-06-02 MED ORDER — ZOLPIDEM TARTRATE 5 MG PO TABS: 5.0000 mg | ORAL_TABLET | Freq: Every evening | ORAL | Status: DC | PRN

## 2018-06-02 NOTE — Progress Notes (Signed)
MD, pt may need to see a podiatrist, pt needs some attention to her toenails, will continue to monitor, Thanks Arvella Nigh RN.

## 2018-06-02 NOTE — Progress Notes (Signed)
Pt has very bad Moisture breakdown. MD was paged if we could have an inter-dry ordered. Will continue to monitor.

## 2018-06-02 NOTE — Progress Notes (Signed)
OT Cancellation Note  Patient Details Name: [name] MRN: [id number] DOB: 1927-05-05   Cancelled Treatment:    Reason Eval/Treat Not Completed: Other (comment).  Pt too groggy at this time. Will reattempt tomorrow as schedule permits.  Mavryk Pino 06/02/2018, 2:34 PM  Lesle Chris, OTR/L Acute Rehabilitation Services [phone] [facility] pager [phone] office 06/02/2018

## 2018-06-02 NOTE — Progress Notes (Signed)
Pt agitated and would not cooperate with xray and MRI.  Susie Cassette

## 2018-06-02 NOTE — Progress Notes (Signed)
ANTICOAGULATION CONSULT NOTE - Initial Consult  Pharmacy Consult for Coumadin : DVT, afib  No Known Allergies  Patient Measurements: Height: 5\' 6"  (167.6 cm) Weight: 201 lb (91.2 kg) IBW/kg (Calculated) : 59.3   Vital Signs: Temp: 98.7 F (37.1 C) (11/28 0104) Temp Source: Oral (11/28 0104) BP: 137/78 (11/28 0104) Pulse Rate: 90 (11/28 0104)  Labs: Recent Labs    06/01/18 1726 06/02/18 0223  HGB 13.8 12.3  HCT 43.5 38.5  PLT 63* 58*  LABPROT  --  25.0*  INR  --  2.30  CREATININE 2.12* 1.87*  TROPONINI <0.03  --     Estimated Creatinine Clearance: 23.2 mL/min (A) (by C-G formula based on SCr of 1.87 mg/dL (H)).   Medical History: Past Medical History:  Diagnosis Date  . Acute encephalopathy   . Acute renal failure superimposed on stage 3 chronic kidney disease ([place])   . Anemia   . Atrial fibrillation ([place])   . Colon cancer ([place]) 12/11  . Colon cancer ([place]) 2011  . Diarrhea   . DVT (deep venous thrombosis) ([place]) 11/06  . Elevated brain natriuretic peptide (BNP) level   . Frequent headaches   . Heart murmur   . Hyperkalemia   . Hypertension   . Pulmonary embolism ([place]) 11/06  . Seizures ([place])   . Stroke ([place]) 11/30/2014    Medications:  Was on coumadin 2mg  daily  Assessment: 84 yo lady with h/o afib, DVT to continue coumadin.  INR 2.30 Goal of Therapy:  INR 2-3 Monitor platelets by anticoagulation protocol: Yes   Plan:  Coumadin 2mg  po today Daily PT/INR  Kynslei Art Poteet 06/02/2018,5:11 AM

## 2018-06-02 NOTE — Progress Notes (Signed)
Obtained lying and seated VS. Pt too sedated to get standing VS.  Susie Cassette

## 2018-06-02 NOTE — H&P (Signed)
History and Physical    [name]:[id number] DOB: 1926-09-28 DOA: 06/01/2018  Referring MD/NP/PA:   PCP: Colon Branch, MD   Patient coming from:  The patient is coming from home.  At baseline, pt is partially dependent for most of ADL.        Chief Complaint: fall  HPI: [name] is a 84 y.o. female with medical history significant of hypertension, hyperlipidemia, stroke, depression, CKD 3, atrial fibrillation on Coumadin, DVT/PE on Coumadin, thrombocytopenia, who presents with fall.  Patient states that she has generalized weakness recently, and had 3 falls since Sunday.  Patient denies facial droop or slurred speech. She denies unilateral, weakness, numbness or tingling in extremities, however when I saw patient on the floor, she could not move her right leg.  She states that she has pain in upper back between shoulder blades and pain in right leg shin area. She cannot specify whether she has weakness in right leg or pain making her unable to move her right leg.  She had some chest wall pain earlier after fall, which has resolved currently.  No shortness of breath, cough, fever or chills.  Patient denies nausea, vomiting, diarrhea or abdominal pain.  She states that she has intermittent dysuria and burning or urination recently.  Of note, patient has ongoing visual hallucination, seeing man on the wall. She is following with neurologist for hallucination.  ED Course: pt was found to have urinalysis (clear appearance, trace amount of leukocyte, many bacteria, WBC 0-5, positive nitrite), INR 2.30, negative troponin, WBC 7.3, worsening renal function, mild abnormal liver function (ALP 39, AST 64, ALT 23, total bilirubin 2.0), temperature normal, tachycardia, no tachypnea, oxygen saturation 96% on room air, chest x-ray negative for infiltration, showed mild cardiomegaly.  CT of head and C-spine negative for acute issues.  Patient is placed on telemetry bed for observation.  Review of  Systems:   General: no fevers, chills, no body weight gain, has fatigue HEENT: no blurry vision, hearing changes or sore throat Respiratory: no dyspnea, coughing, wheezing CV: had chest pain, no palpitations GI: no nausea, vomiting, abdominal pain, diarrhea, constipation GU: has dysuria, burning on urination, no increased urinary frequency, hematuria  Ext: has mild leg edema Neuro: no vision change or hearing loss. Had fall. Cannot move right leg. Skin: no rash, no skin tear. MSK: has pain in upper back and right leg shin area Heme: No easy bruising.  Travel history: No recent long distant travel.  Allergy: No Known Allergies  Past Medical History:  Diagnosis Date  . Acute encephalopathy   . Acute renal failure superimposed on stage 3 chronic kidney disease ([place])   . Anemia   . Atrial fibrillation ([place])   . Colon cancer ([place]) 12/11  . Colon cancer ([place]) 2011  . Diarrhea   . DVT (deep venous thrombosis) ([place]) 11/06  . Elevated brain natriuretic peptide (BNP) level   . Frequent headaches   . Heart murmur   . Hyperkalemia   . Hypertension   . Pulmonary embolism ([place]) 11/06  . Seizures ([place])   . Stroke [facility]) 11/30/2014    Past Surgical History:  Procedure Laterality Date  . APPENDECTOMY  1952  . COLECTOMY  06/2010   partial, Dr. Donne Hazel complicated by LLE CVT; S/P IVC umbrella & anemia  . HIP FRACTURE SURGERY  2006   Trauma  . TOTAL ABDOMINAL HYSTERECTOMY W/ BILATERAL SALPINGOOPHORECTOMY  1973   For Fibroids  . TOTAL HIP ARTHROPLASTY  01/03/07  Left hip replacement.  Marland Kitchen VENA CAVA FILTER PLACEMENT  06/28/2010    Social History:  reports that she is a non-smoker but has been exposed to tobacco smoke. She has never used smokeless tobacco. She reports that she does not drink alcohol or use drugs.  Family History:  Family History  Problem Relation Age of Onset  . Peripheral vascular disease Father   . Heart attack Mother 31  . Heart disease Mother        MI  .  Coronary artery disease Sister   . Diabetes Paternal Grandmother   . Coronary artery disease Maternal Grandfather      Prior to Admission medications   Medication Sig Start Date End Date Taking? Authorizing Provider  acetaminophen (TYLENOL) 500 MG tablet Take 500 mg by mouth every 8 (eight) hours as needed for mild pain.     [provider]  ADVAIR DISKUS 100-50 MCG/DOSE AEPB Inhale 1 puff into the lungs 2 (two) times daily. 01/03/18   Colon Branch, MD  albuterol (PROVENTIL) (2.5 MG/3ML) 0.083% nebulizer solution Take 3 mLs (2.5 mg total) by nebulization 3 (three) times daily as needed for wheezing or shortness of breath. 05/03/17   Colon Branch, MD  aspirin 81 MG tablet Take 81 mg by mouth daily.    [provider]  atorvastatin (LIPITOR) 20 MG tablet Take 1 tablet (20 mg total) by mouth daily. 10/14/17   Colon Branch, MD  Calcium Carbonate-Vit D-Min (CALCIUM 1200 PO) Take 2 tablets by mouth daily.     [provider]  furosemide (LASIX) 40 MG tablet Take 1 tablet (40 mg total) by mouth daily. 11/22/17   Burnell Blanks, MD  loperamide (IMODIUM A-D) 2 MG tablet Give 2 mg by mouth with first loose stool. Give 2 mg by mouth for additional loose stool.    [provider]  loratadine (CLARITIN) 10 MG tablet Take 10 mg by mouth daily.    [provider]  metoprolol tartrate (LOPRESSOR) 25 MG tablet Take 1.5 tablets (37.5 mg total) by mouth 2 (two) times daily. 04/27/18   Colon Branch, MD  Multiple Vitamins-Minerals (MULTIPLE VITAMINS/WOMENS PO) Take by mouth daily.    [provider]  PROAIR HFA 108 [phone] Base) MCG/ACT inhaler Inhale 2 puffs into the lungs every 6 (six) hours as needed for wheezing or shortness of breath. 01/24/18   Colon Branch, MD  promethazine (PHENERGAN) 12.5 MG tablet Take 1 tablet (12.5 mg total) by mouth 3 (three) times daily as needed for nausea or vomiting. 10/21/17   Colon Branch, MD  QUEtiapine (SEROQUEL) 25 MG tablet Take  1-2 tablets (25-50 mg total) by mouth at bedtime. Patient not taking: Reported on 05/18/2018 03/16/18   Colon Branch, MD  sulfamethoxazole-trimethoprim (BACTRIM DS,SEPTRA DS) 800-160 MG tablet Take 1 tablet by mouth 2 (two) times daily. 03/11/18   Colon Branch, MD  traMADol (ULTRAM) 50 MG tablet Take 1 tablet (50 mg total) by mouth every 6 (six) hours as needed for moderate pain. 06/25/16   Colon Branch, MD  verapamil (CALAN) 120 MG tablet Take 1 tablet (120 mg total) by mouth every evening. 02/02/18   Colon Branch, MD  warfarin (COUMADIN) 2 MG tablet Take 1 tablet (2 mg total) by mouth daily. 05/18/18   Colon Branch, MD  warfarin (COUMADIN) 2.5 MG tablet USE AS DIRECTED BY COUMADIN CLINIC 03/03/18   Colon Branch, MD    Physical Exam:  Vitals:   21-Jun-2018 2130 06/21/2018 2200 06/02/18 0104 06/02/18 0114  BP: (!) 142/73 135/69 137/78   Pulse: (!) 104 (!) 109 90   Resp: 17 16 18    Temp:   98.7 F (37.1 C)   TempSrc:   Oral   SpO2: 96% 94% 96%   Weight:   90.7 kg 91.2 kg  Height:    5\' 6"  (1.676 m)   General: Not in acute distress HEENT:       Eyes: PERRL, EOMI, no scleral icterus.       ENT: No discharge from the ears and nose, no pharynx injection, no tonsillar enlargement.        Neck: No JVD, no bruit, no mass felt. Heme: No neck lymph node enlargement. Cardiac: S1/S2, RRR, No murmurs, No gallops or rubs. Respiratory: No rales, wheezing, rhonchi or rubs. GI: Soft, nondistended, nontender, no rebound pain, no organomegaly, BS present. GU: No hematuria Ext: has trace leg edema bilaterally (R>L). 2+DP/PT pulse bilaterally. Musculoskeletal: No joint deformities, No joint redness or warmth, no limitation of ROM in spin. Skin: No rashes.  Neuro: Alert, oriented X3, cranial nerves II-XII grossly intact, cannot move right leg.  Psych: Patient is not psychotic, no suicidal or hemocidal ideation.  Labs on Admission: I have personally reviewed following labs and imaging studies  CBC: Recent Labs    Lab 06/21/2018 1726 06/02/18 0223  WBC 7.3 5.9  NEUTROABS 5.7  --   HGB 13.8 12.3  HCT 43.5 38.5  MCV 98.0 96.5  PLT 63* 58*   Basic Metabolic Panel: Recent Labs  Lab June 21, 2018 1726 06/02/18 0223  NA 143 146*  K 4.0 3.9  CL 109 114*  CO2 25 24  GLUCOSE 110* 102*  BUN 60* 50*  CREATININE 2.12* 1.87*  CALCIUM 9.6 8.9   GFR: Estimated Creatinine Clearance: 23.2 mL/min (A) (by C-G formula based on SCr of 1.87 mg/dL (H)). Liver Function Tests: Recent Labs  Lab 2018/06/21 1726  AST 64*  ALT 23  ALKPHOS 39  BILITOT 2.0*  PROT 6.7  ALBUMIN 4.5   No results for input(s): LIPASE, AMYLASE in the last 168 hours. No results for input(s): AMMONIA in the last 168 hours. Coagulation Profile: Recent Labs  Lab 06/02/18 0223  INR 2.30   Cardiac Enzymes: Recent Labs  Lab 06-21-18 1726  TROPONINI <0.03   BNP (last 3 results) Recent Labs    11/18/17 1228 01/13/18 1620  PROBNP 1,330* 821*   HbA1C: No results for input(s): HGBA1C in the last 72 hours. CBG: No results for input(s): GLUCAP in the last 168 hours. Lipid Profile: No results for input(s): CHOL, HDL, LDLCALC, TRIG, CHOLHDL, LDLDIRECT in the last 72 hours. Thyroid Function Tests: No results for input(s): TSH, T4TOTAL, FREET4, T3FREE, THYROIDAB in the last 72 hours. Anemia Panel: No results for input(s): VITAMINB12, FOLATE, FERRITIN, TIBC, IRON, RETICCTPCT in the last 72 hours. Urine analysis:    Component Value Date/Time   COLORURINE YELLOW 06-21-18 1726   APPEARANCEUR CLEAR 06/21/2018 1726   APPEARANCEUR Cloudy (A) 01/13/2018 1619   LABSPEC 1.010 06/21/18 1726   PHURINE 6.5 June 21, 2018 1726   GLUCOSEU NEGATIVE 06-21-2018 1726   GLUCOSEU NEGATIVE 08/01/2014 1555   HGBUR TRACE (A) 06/21/2018 1726   BILIRUBINUR NEGATIVE Jun 21, 2018 1726   BILIRUBINUR Negative 01/13/2018 1619   KETONESUR NEGATIVE 06-21-18 1726   PROTEINUR NEGATIVE 06-21-18 1726   UROBILINOGEN 1.0 08/01/2014 1555   NITRITE POSITIVE  (A) 06-21-18 1726   LEUKOCYTESUR TRACE (A) Jun 21, 2018 1726  LEUKOCYTESUR Trace (A) 01/13/2018 1619   Sepsis Labs: @LABRCNTIP (procalcitonin:4,lacticidven:4) )No results found for this or any previous visit (from the past 240 hour(s)).   Radiological Exams on Admission: Dg Chest 2 View  Result Date: 06/01/2018 CLINICAL DATA:  Multiple falls EXAM: CHEST - 2 VIEW COMPARISON:  08/16/2017 FINDINGS: Mild cardiomegaly. No acute opacity or pleural effusion. No pneumothorax. Degenerative changes of the spine. IMPRESSION: No active cardiopulmonary disease.  Mild cardiomegaly. Electronically Signed   By: Donavan Foil M.D.   On: 06/01/2018 18:31   Ct Head Wo Contrast  Result Date: 06/01/2018 CLINICAL DATA:  Multiple recent falls. Reported head injury. History of CVA x2 and colon cancer. EXAM: CT HEAD WITHOUT CONTRAST CT CERVICAL SPINE WITHOUT CONTRAST TECHNIQUE: Multidetector CT imaging of the head and cervical spine was performed following the standard protocol without intravenous contrast. Multiplanar CT image reconstructions of the cervical spine were also generated. COMPARISON:  09/16/2015 head CT. FINDINGS: CT HEAD FINDINGS Brain: No evidence of parenchymal hemorrhage or extra-axial fluid collection. Stable posterior fossa cyst asymmetric to the left along posterior cerebellum compatible with arachnoid cyst. No new mass lesion, mass effect, or midline shift. No CT evidence of acute infarction. Generalized cerebral volume loss. Nonspecific moderate subcortical and periventricular white matter hypodensity, most in keeping with chronic small vessel ischemic change. No ventriculomegaly. Vascular: No acute abnormality. Skull: No evidence of calvarial fracture. Sinuses/Orbits: The visualized paranasal sinuses are essentially clear. Other:  The mastoid air cells are unopacified. CT CERVICAL SPINE FINDINGS Alignment: Exaggerated thoracic kyphosis with normal cervical lordosis. No facet subluxation. Dens is well  positioned between the lateral masses of C1. Mild 3 mm anterolisthesis C4-5 and C5-6. Skull base and vertebrae: No acute fracture. No primary bone lesion or focal pathologic process. Soft tissues and spinal canal: No prevertebral edema. No visible canal hematoma. Disc levels: Moderate to severe multilevel degenerative disc disease in the cervical spine, most prominent at C6-7. Marked bilateral facet arthropathy. Mild degenerative foraminal stenosis bilaterally at C3-4 and C4-5. Upper chest: No acute abnormality. Tiny calcified posterior right upper lobe granuloma. Other: Visualized mastoid air cells appear clear. No discrete thyroid nodules. No pathologically enlarged cervical nodes. IMPRESSION: CT HEAD: 1. No evidence of acute intracranial abnormality. No evidence of calvarial fracture. 2. Generalized cerebral volume loss and moderate chronic small vessel ischemic changes in the cerebral white matter. CT CERVICAL SPINE: 1. No cervical spine fracture or facet subluxation. 2. Moderate to severe multilevel cervical degenerative changes as detailed. Electronically Signed   By: Ilona Sorrel M.D.   On: 06/01/2018 18:37   Ct Cervical Spine Wo Contrast  Result Date: 06/01/2018 CLINICAL DATA:  Multiple recent falls. Reported head injury. History of CVA x2 and colon cancer. EXAM: CT HEAD WITHOUT CONTRAST CT CERVICAL SPINE WITHOUT CONTRAST TECHNIQUE: Multidetector CT imaging of the head and cervical spine was performed following the standard protocol without intravenous contrast. Multiplanar CT image reconstructions of the cervical spine were also generated. COMPARISON:  09/16/2015 head CT. FINDINGS: CT HEAD FINDINGS Brain: No evidence of parenchymal hemorrhage or extra-axial fluid collection. Stable posterior fossa cyst asymmetric to the left along posterior cerebellum compatible with arachnoid cyst. No new mass lesion, mass effect, or midline shift. No CT evidence of acute infarction. Generalized cerebral volume loss.  Nonspecific moderate subcortical and periventricular white matter hypodensity, most in keeping with chronic small vessel ischemic change. No ventriculomegaly. Vascular: No acute abnormality. Skull: No evidence of calvarial fracture. Sinuses/Orbits: The visualized paranasal sinuses are essentially clear. Other:  The mastoid  air cells are unopacified. CT CERVICAL SPINE FINDINGS Alignment: Exaggerated thoracic kyphosis with normal cervical lordosis. No facet subluxation. Dens is well positioned between the lateral masses of C1. Mild 3 mm anterolisthesis C4-5 and C5-6. Skull base and vertebrae: No acute fracture. No primary bone lesion or focal pathologic process. Soft tissues and spinal canal: No prevertebral edema. No visible canal hematoma. Disc levels: Moderate to severe multilevel degenerative disc disease in the cervical spine, most prominent at C6-7. Marked bilateral facet arthropathy. Mild degenerative foraminal stenosis bilaterally at C3-4 and C4-5. Upper chest: No acute abnormality. Tiny calcified posterior right upper lobe granuloma. Other: Visualized mastoid air cells appear clear. No discrete thyroid nodules. No pathologically enlarged cervical nodes. IMPRESSION: CT HEAD: 1. No evidence of acute intracranial abnormality. No evidence of calvarial fracture. 2. Generalized cerebral volume loss and moderate chronic small vessel ischemic changes in the cerebral white matter. CT CERVICAL SPINE: 1. No cervical spine fracture or facet subluxation. 2. Moderate to severe multilevel cervical degenerative changes as detailed. Electronically Signed   By: Ilona Sorrel M.D.   On: 06/01/2018 18:37     EKG: Independently reviewed.  Atrial fibrillation, QTc 487, LAD, poor R wave progression, nonspecific T wave change.  Assessment/Plan Principal Problem:   Fall Active Problems:   Essential hypertension   Hx pulmonary embolism   Warfarin anticoagulation   Dyslipidemia   History of CVA (cerebrovascular accident)    UTI (urinary tract infection)   Thrombocytopenia (HCC)   Acute renal failure superimposed on stage 3 chronic kidney disease ([facility])   Chronic atrial fibrillation   Abnormal LFTs   Hallucination   Right leg weakness   Generalized weakness   Fall: Patient has had multiple falls since Sunday.  Etiology is not clear.  Patient has possible UTI, which may have contributed partially. CT head and CT of C-spine negative for acute issues.  Patient denies symptoms of stroke, but she cannot move right leg.  She also has pain over upper back and right shin.   -will place on tele bed for obs -get MRI-brain to r/o new stroke. -f/u X-fay of T-spine, right tibia/fibular and right hip -PT/OT -Check orthostatic vital sign  HTN:  -Continue home medications: Metoprolol, verapamil -IV hydralazine prn -Hold Lasix due to renal function  hx pulmonary embolism and DVT: on coumadin with INR 2.30 -Coumadin per pharm  Dyslipidemia: -Lipitor  History of CVA (cerebrovascular accident): -ASA and lipitor  Possible UTI (urinary tract infection): Urinalysis showed clear appearance, trace amount of leukocyte, many bacteria, WBC 0-5, positive nitrite.  Since patient is asymptomatic.  Will treat patient for possible UTI. - IV Rocephin -Follow-up urine culture.  Thrombocytopenia ([place]): This is chronic issue.  Platelet 107 on 01/13/2018--> 63.  No bleeding tendency. No confusion. -Follow-up with CBC. -Check peripheral smear  AoCKD-III: Baseline Cre is 1.4, pt's Cre is 2.12 and BUn 60 on admission. Likely due to dehydration and continuation of diuretics - IVF: 1L NS in ED, then 75 cc/h - Follow up renal function by BMP - Hold lasix - US-renal  Atrial Fibrillation: CHA2DS2-VASc Score is 6, needs oral anticoagulation. Patient is on Coumadin at home. INR is 2.30 on admission. Heart rate is 100s -Continue Coumadin and metoprolol   Abnormal LFTs: mild. ALP 39, AST 64, ALT 23, total bilirubin 2.0, no nausea,  vomiting or abdominal pain. -Avoid using liver toxic medication, particularly Tylenol.  Hallucination: Etiology is not clear.  Differential diagnosis include dementia and psychosis, and UTI.   -Patient is to  follow-up with neurology. -Treat UTI.  Right leg weakness: -Follow-up MRI for brain as above  Generalized weakness: -PT/OT    DVT ppx: on coumadin Code Status: Full code Family Communication: None at bed side.    Disposition Plan:  Anticipate discharge back to previous home environment Consults called:  none Admission status: Obs / tele   Date of Service 06/02/2018    Ivor Costa [facility] Pager [phone]  If 7PM-7AM, please contact night-coverage [email or web address] Password [email or web address] 06/02/2018, 5:05 AM

## 2018-06-02 NOTE — Progress Notes (Signed)
[facility] PROGRESS NOTE    Progress Note  [name]  NWG:[id number] DOB: 23-Oct-1926 DOA: 06/01/2018 PCP: Colon Branch, MD     Brief Narrative:   [name] is an 84 y.o. female past medical history significant for hypertension, stroke chronic kidney disease, atrial fibrillation on Coumadin, with a history of DVT and PE who presents after a fall.  Patient states that he has had generalized weakness and 3 falls since 05/29/2018.  In the ED her UA was inconclusive, CT scan of the head and spine showed no acute fractures or trauma.  Assessment/Plan:   Mechanical Fall: Of unclear etiology is not clear. Imaging showed no acute fractures. MRI pending Physical therapy pending Orthostatic vitals are pending Imaging of the thoracic spine, hip and the tib-fib are pending  Acute metabolic encephalopathy: Patient is sleepy and not able to answer questions or carry on a conversation. Question infectious etiology plus or minus acute renal failure she also receive Haldol to try to get her imaging earlier in the day. See further etiologies below for further details.  Acute kidney injury on chronic kidney disease stage III: With a baseline creatinine of 1.1-1.3, in the setting of diuretic use. Diuretics have been held, she was started on IV fluid hydration her creatinine is improving slowly. Continue IV fluids at a lower rate, check a basic metabolic panel in the morning.  Hypovolemic Hypernatremia: With baseline creatinine around 1.3, creatinine still elevated likely to hypovolemic we will change IV fluids to half-normal saline.  Essential hypertension: Continue metoprolol verapamil hold diuretic due to kidney injury.  History of PE DVT: INR therapeutic Coumadin per pharmacy.  Dyslipidemia: Continue Lipitor.  History of CVA: Continue aspirin and Lipitor.  Possible UTI: UA inconclusive, she was started empirically on IV Rocephin awaiting  cultures.  Thrombocytopenia: Likely chronic.  Chronic atrial fibrillation: CHA2DS2-VASc Score is 6, continue INR per pharmacy goal is 2-3. She is currently rate controlled continue metoprolol.  Abnormal LFT's: She denies any abdominal pain nausea or vomiting, just mildly elevated, question due to mild rhabdo check a CK.  Acute encephalopathy with hallucinations: Question delirium in the setting of possible UTI  Right leg weakness: MRI of the brain is pending. Sickle therapy evaluation is pending.   DVT prophylaxis: lovenox Family Communication: Try to call daughter several [phone] have been unsuccessful Disposition Plan/Barrier to D/C: unable to detemine Code Status:     Code Status Orders  (From admission, onward)         Start     Ordered   06/02/18 0128  Full code  Continuous     06/02/18 0128        Code Status History    Date Active Date Inactive Code Status Order ID Comments User Context   09/16/2015 2105 09/20/2015 1645 Full Code [id number]  Norval Morton, MD ED   09/16/2015 2105 09/16/2015 2105 Full Code [id number]  Norval Morton, MD ED   08/30/2015 1540 09/03/2015 1658 Full Code [id number]  Elgergawy, Silver Huguenin, MD ED    Advance Directive Documentation     Most Recent Value  Type of Advance Directive  Healthcare Power of [place], Living will  Pre-existing out of facility DNR order (yellow form or pink MOST form)  -  "MOST" Form in Place?  -        IV Access:    Peripheral IV   Procedures and diagnostic studies:   Dg Chest 2 View  Result Date: 06/01/2018 CLINICAL DATA:  Multiple falls EXAM: CHEST - 2 VIEW COMPARISON:  08/16/2017 FINDINGS: Mild cardiomegaly. No acute opacity or pleural effusion. No pneumothorax. Degenerative changes of the spine. IMPRESSION: No active cardiopulmonary disease.  Mild cardiomegaly. Electronically Signed   By: Donavan Foil M.D.   On: 06/01/2018 18:31   Ct Head Wo Contrast  Result Date: 06/01/2018 CLINICAL  DATA:  Multiple recent falls. Reported head injury. History of CVA x2 and colon cancer. EXAM: CT HEAD WITHOUT CONTRAST CT CERVICAL SPINE WITHOUT CONTRAST TECHNIQUE: Multidetector CT imaging of the head and cervical spine was performed following the standard protocol without intravenous contrast. Multiplanar CT image reconstructions of the cervical spine were also generated. COMPARISON:  09/16/2015 head CT. FINDINGS: CT HEAD FINDINGS Brain: No evidence of parenchymal hemorrhage or extra-axial fluid collection. Stable posterior fossa cyst asymmetric to the left along posterior cerebellum compatible with arachnoid cyst. No new mass lesion, mass effect, or midline shift. No CT evidence of acute infarction. Generalized cerebral volume loss. Nonspecific moderate subcortical and periventricular white matter hypodensity, most in keeping with chronic small vessel ischemic change. No ventriculomegaly. Vascular: No acute abnormality. Skull: No evidence of calvarial fracture. Sinuses/Orbits: The visualized paranasal sinuses are essentially clear. Other:  The mastoid air cells are unopacified. CT CERVICAL SPINE FINDINGS Alignment: Exaggerated thoracic kyphosis with normal cervical lordosis. No facet subluxation. Dens is well positioned between the lateral masses of C1. Mild 3 mm anterolisthesis C4-5 and C5-6. Skull base and vertebrae: No acute fracture. No primary bone lesion or focal pathologic process. Soft tissues and spinal canal: No prevertebral edema. No visible canal hematoma. Disc levels: Moderate to severe multilevel degenerative disc disease in the cervical spine, most prominent at C6-7. Marked bilateral facet arthropathy. Mild degenerative foraminal stenosis bilaterally at C3-4 and C4-5. Upper chest: No acute abnormality. Tiny calcified posterior right upper lobe granuloma. Other: Visualized mastoid air cells appear clear. No discrete thyroid nodules. No pathologically enlarged cervical nodes. IMPRESSION: CT HEAD: 1.  No evidence of acute intracranial abnormality. No evidence of calvarial fracture. 2. Generalized cerebral volume loss and moderate chronic small vessel ischemic changes in the cerebral white matter. CT CERVICAL SPINE: 1. No cervical spine fracture or facet subluxation. 2. Moderate to severe multilevel cervical degenerative changes as detailed. Electronically Signed   By: Ilona Sorrel M.D.   On: 06/01/2018 18:37   Ct Cervical Spine Wo Contrast  Result Date: 06/01/2018 CLINICAL DATA:  Multiple recent falls. Reported head injury. History of CVA x2 and colon cancer. EXAM: CT HEAD WITHOUT CONTRAST CT CERVICAL SPINE WITHOUT CONTRAST TECHNIQUE: Multidetector CT imaging of the head and cervical spine was performed following the standard protocol without intravenous contrast. Multiplanar CT image reconstructions of the cervical spine were also generated. COMPARISON:  09/16/2015 head CT. FINDINGS: CT HEAD FINDINGS Brain: No evidence of parenchymal hemorrhage or extra-axial fluid collection. Stable posterior fossa cyst asymmetric to the left along posterior cerebellum compatible with arachnoid cyst. No new mass lesion, mass effect, or midline shift. No CT evidence of acute infarction. Generalized cerebral volume loss. Nonspecific moderate subcortical and periventricular white matter hypodensity, most in keeping with chronic small vessel ischemic change. No ventriculomegaly. Vascular: No acute abnormality. Skull: No evidence of calvarial fracture. Sinuses/Orbits: The visualized paranasal sinuses are essentially clear. Other:  The mastoid air cells are unopacified. CT CERVICAL SPINE FINDINGS Alignment: Exaggerated thoracic kyphosis with normal cervical lordosis. No facet subluxation. Dens is well positioned between the lateral masses of C1. Mild 3 mm anterolisthesis C4-5 and C5-6. Skull base and vertebrae: No  acute fracture. No primary bone lesion or focal pathologic process. Soft tissues and spinal canal: No prevertebral  edema. No visible canal hematoma. Disc levels: Moderate to severe multilevel degenerative disc disease in the cervical spine, most prominent at C6-7. Marked bilateral facet arthropathy. Mild degenerative foraminal stenosis bilaterally at C3-4 and C4-5. Upper chest: No acute abnormality. Tiny calcified posterior right upper lobe granuloma. Other: Visualized mastoid air cells appear clear. No discrete thyroid nodules. No pathologically enlarged cervical nodes. IMPRESSION: CT HEAD: 1. No evidence of acute intracranial abnormality. No evidence of calvarial fracture. 2. Generalized cerebral volume loss and moderate chronic small vessel ischemic changes in the cerebral white matter. CT CERVICAL SPINE: 1. No cervical spine fracture or facet subluxation. 2. Moderate to severe multilevel cervical degenerative changes as detailed. Electronically Signed   By: Ilona Sorrel M.D.   On: 06/01/2018 18:37     Medical Consultants:    None.  Anti-Infectives:   IV rocephin  Subjective:    Aryahi Denzler Keelan nonverbal, hard to arouse.  Objective:    Vitals:   06/01/18 2200 06/02/18 0104 06/02/18 0114 06/02/18 0620  BP: 135/69 137/78  134/71  Pulse: (!) 109 90  82  Resp: 16 18  18   Temp:  98.7 F (37.1 C)  98.5 F (36.9 C)  TempSrc:  Oral  Oral  SpO2: 94% 96%  96%  Weight:  90.7 kg 91.2 kg   Height:   5\' 6"  (1.676 m)     Intake/Output Summary (Last 24 hours) at 06/02/2018 0732 Last data filed at 06/02/2018 0658 Gross per 24 hour  Intake 1304.58 ml  Output -  Net 1304.58 ml   Filed Weights   06/02/18 0104 06/02/18 0114  Weight: 90.7 kg 91.2 kg    Exam: General exam: In no acute distress. Respiratory system: Good air movement and clear to auscultation. Cardiovascular system: S1 & S2 heard, RRR.  Gastrointestinal system: Abdomen is nondistended, soft and nontender.  Central nervous system: Patient is hard to arouse very sleepy, cannot cooperate on physical exam she does withdraw from  pain. Extremities: No pedal edema. Skin: No rashes, lesions or ulcers    Data Reviewed:    Labs: Basic Metabolic Panel: Recent Labs  Lab 06/01/18 1726 06/02/18 0223  NA 143 146*  K 4.0 3.9  CL 109 114*  CO2 25 24  GLUCOSE 110* 102*  BUN 60* 50*  CREATININE 2.12* 1.87*  CALCIUM 9.6 8.9   GFR Estimated Creatinine Clearance: 23.2 mL/min (A) (by C-G formula based on SCr of 1.87 mg/dL (H)). Liver Function Tests: Recent Labs  Lab 06/01/18 1726  AST 64*  ALT 23  ALKPHOS 39  BILITOT 2.0*  PROT 6.7  ALBUMIN 4.5   No results for input(s): LIPASE, AMYLASE in the last 168 hours. No results for input(s): AMMONIA in the last 168 hours. Coagulation profile Recent Labs  Lab 06/02/18 0223  INR 2.30    CBC: Recent Labs  Lab 06/01/18 1726 06/02/18 0223  WBC 7.3 5.9  NEUTROABS 5.7  --   HGB 13.8 12.3  HCT 43.5 38.5  MCV 98.0 96.5  PLT 63* 58*   Cardiac Enzymes: Recent Labs  Lab 06/01/18 1726  TROPONINI <0.03   BNP (last 3 results) Recent Labs    11/18/17 1228 01/13/18 1620  PROBNP 1,330* 821*   CBG: No results for input(s): GLUCAP in the last 168 hours. D-Dimer: No results for input(s): DDIMER in the last 72 hours. Hgb A1c: No results for input(s): HGBA1C  in the last 72 hours. Lipid Profile: No results for input(s): CHOL, HDL, LDLCALC, TRIG, CHOLHDL, LDLDIRECT in the last 72 hours. Thyroid function studies: No results for input(s): TSH, T4TOTAL, T3FREE, THYROIDAB in the last 72 hours.  Invalid input(s): FREET3 Anemia work up: No results for input(s): VITAMINB12, FOLATE, FERRITIN, TIBC, IRON, RETICCTPCT in the last 72 hours. Sepsis Labs: Recent Labs  Lab 06/01/18 1726 06/02/18 0223  WBC 7.3 5.9   Microbiology No results found for this or any previous visit (from the past 240 hour(s)).   Medications:   . aspirin EC  81 mg Oral Daily  . atorvastatin  20 mg Oral Daily  . calcium-vitamin D  1 tablet Oral Q breakfast  . loratadine  10 mg Oral  Daily  . metoprolol tartrate  37.5 mg Oral BID  . mometasone-formoterol  2 puff Inhalation BID  . multivitamin with minerals  1 tablet Oral Daily  . QUEtiapine  25-50 mg Oral QHS  . verapamil  120 mg Oral QPM  . warfarin  2 mg Oral ONCE-1800  . Warfarin - Pharmacist Dosing Inpatient   Does not apply q1800   Continuous Infusions: . sodium chloride Stopped (06/01/18 2256)  . sodium chloride 75 mL/hr at 06/02/18 0642  . cefTRIAXone (ROCEPHIN)  IV       LOS: 0 days   Charlynne Cousins  [facility]   *Please refer to [email or web address], password [email or web address] to get updated schedule on who will round on this patient, as hospitalists switch teams weekly. If 7PM-7AM, please contact night-coverage at [email or web address], password [email or web address] for any overnight needs.  06/02/2018, 7:32 AM

## 2018-06-02 NOTE — Progress Notes (Signed)
Pt woke up very confused & intermittently combative. Not following commands.Staff unable to obtain Orthostatic vitals. Pt unable to stand up. MD notified. He ordered Haldol. Will administer and continue to monitor pt.

## 2018-06-02 NOTE — Progress Notes (Signed)
PT Cancellation Note  Patient Details Name: [name] MRN: [id number] DOB: 1926/12/21   Cancelled Treatment:    Reason Eval/Treat Not Completed: Patient not medically ready(agitated this am, haldol orders received)   Duncan Dull 06/02/2018, 7:21 AM

## 2018-06-03 ENCOUNTER — Inpatient Hospital Stay (HOSPITAL_COMMUNITY): Payer: Medicare Other

## 2018-06-03 DIAGNOSIS — R29898 Other symptoms and signs involving the musculoskeletal system: Secondary | ICD-10-CM

## 2018-06-03 LAB — BASIC METABOLIC PANEL
Anion gap: 8 (ref 5–15)
BUN: 43 mg/dL — ABNORMAL HIGH (ref 8–23)
CO2: 23 mmol/L (ref 22–32)
Calcium: 9.2 mg/dL (ref 8.9–10.3)
Chloride: 113 mmol/L — ABNORMAL HIGH (ref 98–111)
Creatinine, Ser: 1.7 mg/dL — ABNORMAL HIGH (ref 0.44–1.00)
GFR calc Af Amer: 30 mL/min — ABNORMAL LOW (ref >60)
GFR, EST NON AFRICAN AMERICAN: 26 mL/min — AB (ref >60)
GLUCOSE: 85 mg/dL (ref 70–99)
Potassium: 4.1 mmol/L (ref 3.5–5.1)
Sodium: 144 mmol/L (ref 135–145)

## 2018-06-03 LAB — GLUCOSE, CAPILLARY: Glucose-Capillary: 122 mg/dL — ABNORMAL HIGH (ref 70–99)

## 2018-06-03 LAB — PROTIME-INR
INR: 2.09
Prothrombin Time: 23.2 seconds — ABNORMAL HIGH (ref 11.4–15.2)

## 2018-06-03 LAB — PATHOLOGIST SMEAR REVIEW

## 2018-06-03 MED ORDER — WARFARIN SODIUM 2 MG PO TABS
2.0000 mg | ORAL_TABLET | Freq: Once | ORAL | Status: AC
  Administered 2018-06-03: 2 mg via ORAL
  Filled 2018-06-03: qty 1

## 2018-06-03 NOTE — NC FL2 (Signed)
[place] LEVEL OF CARE SCREENING TOOL     IDENTIFICATION  Patient Name: [name] Birthdate: 1927-05-09 Sex: female Admission Date (Current Location): 06/01/2018  [facility] and [place] Number:  Herbalist and Address:  [facility], [place] [address], [place], [place] [address]      Provider Number: [id number]  Attending Physician Name and Address:  Oswald Hillock, MD  Relative Name and Phone Number:       Current Level of Care: Hospital Recommended Level of Care: [place] Prior Approval Number:    Date Approved/Denied:   PASRR Number: [id number]  Discharge Plan: SNF    Current Diagnoses: Patient Active Problem List   Diagnosis Date Noted  . Right leg weakness 06/02/2018  . Generalized weakness   . Fall 06/01/2018  . Abnormal LFTs 06/01/2018  . Hallucination 06/01/2018  . Abnormal chest x-ray 09/28/2015  . Edema of right lower extremity 09/28/2015  . Chronic atrial fibrillation 09/28/2015  . Elevated brain natriuretic peptide (BNP) level 09/17/2015  . Acute renal failure superimposed on stage 3 chronic kidney disease ([place]) 09/17/2015  . Seizures ([place]) 09/16/2015  . Epistaxis 09/07/2015  . Asthma 08/14/2015  . PCP NOTES >>>>>>>>>>>>>>>>>>>>>>>>>>>>>> 04/24/2015  . TIA (transient ischemic attack) 12/11/2014  . Dyslipidemia 12/11/2014  . History of CVA (cerebrovascular accident) 12/01/2014  . UTI (urinary tract infection) 12/01/2014  . Thrombocytopenia ([place]) 12/01/2014  . Chronic renal insufficiency, stage II (mild) 11/12/2013  . Impingement syndrome, shoulder, left 11/09/2013  . Acquired short bowel syndrome 11/09/2013  . Annual physical exam 08/30/2013  . Hx pulmonary embolism 06/27/2013  . Warfarin anticoagulation 06/27/2013  . DDD (degenerative disc disease), lumbar 11/18/2012  . Mitral regurgitation 08/26/2011  . DYSPHAGIA UNSPECIFIED 07/31/2010  . ATRIAL FIBRILLATION   07/08/2010  . COLON  CANCER, HX OF 07/08/2010  . DIZZINESS 10/18/2009  . Essential hypertension 12/16/2007  . HIP PAIN, CHRONIC 12/28/2006  . ANEMIA-NOS 07/15/2006  . Personal history of venous thrombosis and embolism 07/15/2006    Orientation RESPIRATION BLADDER Height & Weight     Self, Time, Situation, Place  Normal External catheter, Incontinent(placed 11/29) Weight: 189 lb (85.7 kg) Height:  5\' 6"  (167.6 cm)  BEHAVIORAL SYMPTOMS/MOOD NEUROLOGICAL BOWEL NUTRITION STATUS      Continent Diet(heart healthy, thin liquids)  AMBULATORY STATUS COMMUNICATION OF NEEDS Skin   Extensive Assist Verbally Normal                       Personal Care Assistance Level of Assistance  Dressing, Feeding, Bathing Bathing Assistance: Maximum assistance Feeding assistance: Limited assistance Dressing Assistance: Maximum assistance     Functional Limitations Info  Sight, Speech, Hearing Sight Info: Adequate Hearing Info: Adequate Speech Info: Adequate    SPECIAL CARE FACTORS FREQUENCY  OT (By licensed OT), PT (By licensed PT)     PT Frequency: 2x OT Frequency: 2x            Contractures Contractures Info: Not present    Additional Factors Info  Code Status, Allergies Code Status Info: Full Code Allergies Info: No known allergies           Current Medications (06/03/2018):  This is the current hospital active medication list Current Facility-Administered Medications  Medication Dose Route Frequency Provider Last Rate Last Dose  . 0.45 % sodium chloride infusion   Intravenous Continuous Charlynne Cousins, MD 75 mL/hr at 06/03/18 0503    . albuterol (PROVENTIL) (2.5  MG/3ML) 0.083% nebulizer solution 2.5 mg  2.5 mg Nebulization Q4H PRN Ivor Costa, MD      . aspirin EC tablet 81 mg  81 mg Oral Daily Ivor Costa, MD   81 mg at 06/03/18 0919  . atorvastatin (LIPITOR) tablet 20 mg  20 mg Oral Daily Ivor Costa, MD   20 mg at 06/03/18 0918  . calcium-vitamin D (OSCAL WITH D) 500-200 MG-UNIT per  tablet 1 tablet  1 tablet Oral Q breakfast Ivor Costa, MD   1 tablet at 06/03/18 0919  . cefTRIAXone (ROCEPHIN) 1 g in sodium chloride 0.9 % 100 mL IVPB  1 g Intravenous Q24H Ivor Costa, MD 200 mL/hr at 06/02/18 1736 1 g at 06/02/18 1736  . hydrALAZINE (APRESOLINE) injection 5 mg  5 mg Intravenous Q2H PRN Ivor Costa, MD      . loperamide (IMODIUM) capsule 2 mg  2 mg Oral BID PRN Ivor Costa, MD      . loratadine (CLARITIN) tablet 10 mg  10 mg Oral Daily Ivor Costa, MD   10 mg at 06/03/18 2637  . metoprolol tartrate (LOPRESSOR) tablet 37.5 mg  37.5 mg Oral BID Ivor Costa, MD   37.5 mg at 06/03/18 0918  . mometasone-formoterol (DULERA) 100-5 MCG/ACT inhaler 2 puff  2 puff Inhalation BID Ivor Costa, MD   2 puff at 06/03/18 0906  . multivitamin with minerals tablet 1 tablet  1 tablet Oral Daily Ivor Costa, MD   1 tablet at 06/03/18 [phone]  . ondansetron (ZOFRAN) tablet 4 mg  4 mg Oral Q6H PRN Ivor Costa, MD       Or  . ondansetron [facility]) injection 4 mg  4 mg Intravenous Q6H PRN Ivor Costa, MD      . QUEtiapine (SEROQUEL) tablet 25-50 mg  25-50 mg Oral QHS Ivor Costa, MD   25 mg at 06/02/18 2319  . traMADol (ULTRAM) tablet 50 mg  50 mg Oral Q6H PRN Ivor Costa, MD      . verapamil (CALAN) tablet 120 mg  120 mg Oral QPM Ivor Costa, MD   120 mg at 06/02/18 1732  . warfarin (COUMADIN) tablet 2 mg  2 mg Oral ONCE-1800 Durwin Nora, [place]      . Warfarin - Pharmacist Dosing Inpatient   Does not apply [id number] Ivor Costa, MD      . zolpidem (AMBIEN) tablet 5 mg  5 mg Oral QHS PRN Ivor Costa, MD         Discharge Medications: Please see discharge summary for a list of discharge medications.  Relevant Imaging Results:  Relevant Lab Results:   Additional Information SSN: [id number]  Wallace Angles [place], LCSW

## 2018-06-03 NOTE — Progress Notes (Signed)
MRI attempted again on patient.  She would not lay head back, said too painful.  Numerus attempts to make patient comfortable but were unsuccessful.  Pt demanded to be put back in bed.

## 2018-06-03 NOTE — Progress Notes (Signed)
Patient refused bedside x ray, attempted several times to explain to patient however she is refusing. Paged MD Darrick Meigs

## 2018-06-03 NOTE — Progress Notes (Signed)
[facility]  PROGRESS NOTE  [name] OFB:[id number] DOB: [birth year] DOA: 06/01/2018 PCP: Colon Branch, MD   Brief HPI:   84 year old female with a history of hypertension, stroke, CKD, atrial fibrillation on Coumadin with a history of DVT and PE came to the hospital after a fall.  Patient also was found to have abnormal UA and started on IV ceftriaxone.  Urine culture growing E. coli.    Subjective   Patient seen and examined, feels better this morning she has been moving her right lower extremity.  She refused MRI this morning.   Assessment/Plan:     1. Mechanical fall-imaging showed no acute fracture.  Physical therapy has been consulted.  And they recommended skilled nursing facility. 2. UTI-patient had a normal UA, started on IV ceftriaxone.  Urine culture growing E. coli.  Continue with ceftriaxone.  Will change to p.o. antibiotics once we get final sensitivities. 3. Acute encephalopathy-resolved, patient was apparently somnolent and was unable to carry a conversation when she was admitted.  This is likely due to the UTI.  Currently she is improving. 4. Acute kidney injury on CKD stage III-patient's baseline creatinine is around 1-1.3 in setting of diuretic use.  Diuretics on hold.  Started on IV hydration.  Today creatinine is 1.70.  Continue half-normal saline at 75 mL/h. 5. Hypovolemic hypernatremia-sodium slowly improving with hypotonic saline as above.  Today sodium is 144.  Follow BMP in a.m. 6. Hypertension-continue metoprolol, verapamil.  Diuretics on hold due to renal failure. 7. History of PE/DVT-continue Coumadin, dosing per pharmacy. 8. History of CVA-continue aspirin, Lipitor 9. Chronic atrial fibrillation- CHA2DS2VASc score is 6, continue warfarin per pharmacy.  Metoprolol for rate control. 10. Right leg weakness-apparently patient had right leg weakness.  Seems to have resolved.  Patient earlier refused MRI brain, seen by physical therapy today.  Will obtain  pelvic/hip x-ray.      CBC: Recent Labs  Lab 06/01/18 1726 06/02/18 0223  WBC 7.3 5.9  NEUTROABS 5.7  --   HGB 13.8 12.3  HCT 43.5 38.5  MCV 98.0 96.5  PLT 63* 58*    Basic Metabolic Panel: Recent Labs  Lab 06/01/18 1726 06/02/18 0223 06/03/18 0453  NA 143 146* 144  K 4.0 3.9 4.1  CL 109 114* 113*  CO2 25 24 23   GLUCOSE 110* 102* 85  BUN 60* 50* 43*  CREATININE 2.12* 1.87* 1.70*  CALCIUM 9.6 8.9 9.2     DVT prophylaxis: Patient is on warfarin  Code Status: Full code  Family Communication: Discussed with patient's daughter on the phone.  And informed that patient has refused MRI.  Disposition Plan: likely home when medically ready for discharge   Consultants:  None  Procedures:  None   Antibiotics:   Anti-infectives (From admission, onward)   Start     Dose/Rate Route Frequency Ordered Stop   06/02/18 1900  cefTRIAXone (ROCEPHIN) 1 g in sodium chloride 0.9 % 100 mL IVPB     1 g 200 mL/hr over 30 Minutes Intravenous Every 24 hours 06/02/18 0119     06/01/18 1930  cefTRIAXone (ROCEPHIN) 1 g in sodium chloride 0.9 % 100 mL IVPB     1 g 200 mL/hr over 30 Minutes Intravenous  Once 06/01/18 1923 06/01/18 2024       Objective   Vitals:   06/03/18 0421 06/03/18 0906 06/03/18 0916 06/03/18 0917  BP: 139/60   (!) 125/53  Pulse: 65  67 65  Resp: 18  16  18  Temp: 97.6 F (36.4 C)     TempSrc: Oral     SpO2: 96% 97% 97% 99%  Weight: 85.7 kg     Height:        Intake/Output Summary (Last 24 hours) at 06/03/2018 1413 Last data filed at 06/03/2018 1358 Gross per 24 hour  Intake 1613.59 ml  Output 375 ml  Net 1238.59 ml   Filed Weights   06/02/18 0104 06/02/18 0114 06/03/18 0421  Weight: 90.7 kg 91.2 kg 85.7 kg     Physical Examination:    General: Appears in no acute distress  Cardiovascular: S1-S2, regular, no murmurs auscultated  Respiratory: Clear to auscultation bilaterally, no wheezing or crackles auscultated  Abdomen:  Soft, nontender, no organomegaly  Extremities: No edema of the lower extremities  Neurologic: Alert, oriented x3, moving all extremities     Data Reviewed: I have personally reviewed following labs and imaging studies   Recent Results (from the past 240 hour(s))  Urine culture     Status: Abnormal (Preliminary result)   Collection Time: 06/01/18  5:26 PM  Result Value Ref Range Status   Specimen Description   Final    URINE, CATHETERIZED Performed at [facility], [address] [place], [place] [address]    Special Requests   Final    NONE Performed at [facility], [address] [place], [place] [address]    Culture >=100,000 COLONIES/mL ESCHERICHIA COLI (A)  Final   Report Status PENDING  Incomplete     Liver Function Tests: Recent Labs  Lab 06/01/18 1726  AST 64*  ALT 23  ALKPHOS 39  BILITOT 2.0*  PROT 6.7  ALBUMIN 4.5   No results for input(s): LIPASE, AMYLASE in the last 168 hours. No results for input(s): AMMONIA in the last 168 hours.  Cardiac Enzymes: Recent Labs  Lab 06/01/18 1726 06/02/18 0223  CKTOTAL  --  557*  TROPONINI <0.03  --    BNP (last 3 results) No results for input(s): BNP in the last 8760 hours.  ProBNP (last 3 results) Recent Labs    11/18/17 1228 01/13/18 1620  PROBNP 1,330* 821*      Studies: Dg Chest 2 View  Result Date: 06/01/2018 CLINICAL DATA:  Multiple falls EXAM: CHEST - 2 VIEW COMPARISON:  08/16/2017 FINDINGS: Mild cardiomegaly. No acute opacity or pleural effusion. No pneumothorax. Degenerative changes of the spine. IMPRESSION: No active cardiopulmonary disease.  Mild cardiomegaly. Electronically Signed   By: Donavan Foil M.D.   On: 06/01/2018 18:31   Dg Thoracic Spine 2 View  Result Date: 06/02/2018 CLINICAL DATA:  Multiple falls over the past few days. Confusion. EXAM: THORACIC SPINE 2 VIEWS COMPARISON:  Plain film of the abdomen dated 06/20/2010. Chest x-ray dated 06/01/2018. CT  abdomen dated 08/06/2014. FINDINGS: Stable scoliosis of the thoracolumbar spine. No acute or suspicious osseous finding, although characterization is significantly limited by the lack of a lateral view (lateral view decline by the patient). Visualized paravertebral soft tissues are unremarkable. IVC filter in place. IMPRESSION: Study significantly limited by the lack of a lateral view (declined by the patient). Stable scoliosis. Electronically Signed   By: Franki Cabot M.D.   On: 06/02/2018 08:01   Dg Tibia/fibula Right  Result Date: 06/02/2018 CLINICAL DATA:  Fall yesterday with lower leg pain, initial encounter EXAM: RIGHT TIBIA AND FIBULA - 2 VIEW COMPARISON:  None. FINDINGS: Degenerative changes are noted about the knee joint. No acute fracture or  dislocation is seen. No gross soft tissue abnormality is noted. IMPRESSION: Chronic changes without acute abnormality. Electronically Signed   By: Inez Catalina M.D.   On: 06/02/2018 14:25   Ct Head Wo Contrast  Result Date: 06/01/2018 CLINICAL DATA:  Multiple recent falls. Reported head injury. History of CVA x2 and colon cancer. EXAM: CT HEAD WITHOUT CONTRAST CT CERVICAL SPINE WITHOUT CONTRAST TECHNIQUE: Multidetector CT imaging of the head and cervical spine was performed following the standard protocol without intravenous contrast. Multiplanar CT image reconstructions of the cervical spine were also generated. COMPARISON:  09/16/2015 head CT. FINDINGS: CT HEAD FINDINGS Brain: No evidence of parenchymal hemorrhage or extra-axial fluid collection. Stable posterior fossa cyst asymmetric to the left along posterior cerebellum compatible with arachnoid cyst. No new mass lesion, mass effect, or midline shift. No CT evidence of acute infarction. Generalized cerebral volume loss. Nonspecific moderate subcortical and periventricular white matter hypodensity, most in keeping with chronic small vessel ischemic change. No ventriculomegaly. Vascular: No acute  abnormality. Skull: No evidence of calvarial fracture. Sinuses/Orbits: The visualized paranasal sinuses are essentially clear. Other:  The mastoid air cells are unopacified. CT CERVICAL SPINE FINDINGS Alignment: Exaggerated thoracic kyphosis with normal cervical lordosis. No facet subluxation. Dens is well positioned between the lateral masses of C1. Mild 3 mm anterolisthesis C4-5 and C5-6. Skull base and vertebrae: No acute fracture. No primary bone lesion or focal pathologic process. Soft tissues and spinal canal: No prevertebral edema. No visible canal hematoma. Disc levels: Moderate to severe multilevel degenerative disc disease in the cervical spine, most prominent at C6-7. Marked bilateral facet arthropathy. Mild degenerative foraminal stenosis bilaterally at C3-4 and C4-5. Upper chest: No acute abnormality. Tiny calcified posterior right upper lobe granuloma. Other: Visualized mastoid air cells appear clear. No discrete thyroid nodules. No pathologically enlarged cervical nodes. IMPRESSION: CT HEAD: 1. No evidence of acute intracranial abnormality. No evidence of calvarial fracture. 2. Generalized cerebral volume loss and moderate chronic small vessel ischemic changes in the cerebral white matter. CT CERVICAL SPINE: 1. No cervical spine fracture or facet subluxation. 2. Moderate to severe multilevel cervical degenerative changes as detailed. Electronically Signed   By: Ilona Sorrel M.D.   On: 06/01/2018 18:37   Ct Cervical Spine Wo Contrast  Result Date: 06/01/2018 CLINICAL DATA:  Multiple recent falls. Reported head injury. History of CVA x2 and colon cancer. EXAM: CT HEAD WITHOUT CONTRAST CT CERVICAL SPINE WITHOUT CONTRAST TECHNIQUE: Multidetector CT imaging of the head and cervical spine was performed following the standard protocol without intravenous contrast. Multiplanar CT image reconstructions of the cervical spine were also generated. COMPARISON:  09/16/2015 head CT. FINDINGS: CT HEAD FINDINGS  Brain: No evidence of parenchymal hemorrhage or extra-axial fluid collection. Stable posterior fossa cyst asymmetric to the left along posterior cerebellum compatible with arachnoid cyst. No new mass lesion, mass effect, or midline shift. No CT evidence of acute infarction. Generalized cerebral volume loss. Nonspecific moderate subcortical and periventricular white matter hypodensity, most in keeping with chronic small vessel ischemic change. No ventriculomegaly. Vascular: No acute abnormality. Skull: No evidence of calvarial fracture. Sinuses/Orbits: The visualized paranasal sinuses are essentially clear. Other:  The mastoid air cells are unopacified. CT CERVICAL SPINE FINDINGS Alignment: Exaggerated thoracic kyphosis with normal cervical lordosis. No facet subluxation. Dens is well positioned between the lateral masses of C1. Mild 3 mm anterolisthesis C4-5 and C5-6. Skull base and vertebrae: No acute fracture. No primary bone lesion or focal pathologic process. Soft tissues and spinal canal: No prevertebral edema.  No visible canal hematoma. Disc levels: Moderate to severe multilevel degenerative disc disease in the cervical spine, most prominent at C6-7. Marked bilateral facet arthropathy. Mild degenerative foraminal stenosis bilaterally at C3-4 and C4-5. Upper chest: No acute abnormality. Tiny calcified posterior right upper lobe granuloma. Other: Visualized mastoid air cells appear clear. No discrete thyroid nodules. No pathologically enlarged cervical nodes. IMPRESSION: CT HEAD: 1. No evidence of acute intracranial abnormality. No evidence of calvarial fracture. 2. Generalized cerebral volume loss and moderate chronic small vessel ischemic changes in the cerebral white matter. CT CERVICAL SPINE: 1. No cervical spine fracture or facet subluxation. 2. Moderate to severe multilevel cervical degenerative changes as detailed. Electronically Signed   By: Ilona Sorrel M.D.   On: 06/01/2018 18:37   Dg Hip Unilat With  Pelvis 1v Right  Result Date: 06/02/2018 CLINICAL DATA:  Multiple falls over the past few days. Confusion. EXAM: DG HIP (WITH OR WITHOUT PELVIS) 1V RIGHT COMPARISON:  Plain film of the abdomen dated 06/20/2010. FINDINGS: Single-view of the pelvis and single view of the RIGHT hip are provided. LEFT hip arthroplasty hardware appears intact and stable in position. No acute osseous fracture or dislocation seen. Soft tissues about the pelvis and RIGHT hip are unremarkable. IMPRESSION: No acute findings. Electronically Signed   By: Franki Cabot M.D.   On: 06/02/2018 08:03    Scheduled Meds: . aspirin EC  81 mg Oral Daily  . atorvastatin  20 mg Oral Daily  . calcium-vitamin D  1 tablet Oral Q breakfast  . loratadine  10 mg Oral Daily  . metoprolol tartrate  37.5 mg Oral BID  . mometasone-formoterol  2 puff Inhalation BID  . multivitamin with minerals  1 tablet Oral Daily  . QUEtiapine  25-50 mg Oral QHS  . verapamil  120 mg Oral QPM  . warfarin  2 mg Oral ONCE-1800  . Warfarin - Pharmacist Dosing Inpatient   Does not apply q1800    Admission status: Observation/Inpatient: Based on patients clinical presentation and evaluation of above clinical data, I have made determination that patient meets Inpatient criteria at this time.  Patient requiring IV antibiotics for UTI.  Also has weakness/gait imbalance on ambulation.  She will need skilled rehab.  Time spent: 20 min  [facility] Pager [phone]. If 7PM-7AM, please contact night-coverage at [email or web address], Office  [phone]  password [email or web address]  06/03/2018, 2:13 PM  LOS: 1 day

## 2018-06-03 NOTE — Progress Notes (Signed)
Notified MD [place] about patient refusing MRI

## 2018-06-03 NOTE — Progress Notes (Addendum)
ANTICOAGULATION CONSULT NOTE - Consult  Pharmacy Consult for Coumadin : DVT, afib  No Known Allergies  Patient Measurements: Height: 5\' 6"  (167.6 cm) Weight: 189 lb (85.7 kg) IBW/kg (Calculated) : 59.3   Vital Signs: Temp: 97.6 F (36.4 C) (11/29 0421) Temp Source: Oral (11/29 0421) BP: 139/60 (11/29 0421) Pulse Rate: 65 (11/29 0421)  Labs: Recent Labs    06/01/18 1726 06/02/18 0223 06/03/18 0453  HGB 13.8 12.3  --   HCT 43.5 38.5  --   PLT 63* 58*  --   LABPROT  --  25.0* 23.2*  INR  --  2.30 2.09  CREATININE 2.12* 1.87* 1.70*  CKTOTAL  --  557*  --   TROPONINI <0.03  --   --    Estimated Creatinine Clearance: 24.8 mL/min (A) (by C-G formula based on SCr of 1.7 mg/dL (H)).  Medical History: Past Medical History:  Diagnosis Date  . Acute encephalopathy   . Acute renal failure superimposed on stage 3 chronic kidney disease ([place])   . Anemia   . Atrial fibrillation ([place])   . Colon cancer ([place]) 12/11  . Colon cancer ([place]) 2011  . Diarrhea   . DVT (deep venous thrombosis) ([place]) 11/06  . Elevated brain natriuretic peptide (BNP) level   . Frequent headaches   . Heart murmur   . Hyperkalemia   . Hypertension   . Pulmonary embolism ([place]) 11/06  . Seizures ([place])   . Stroke [facility]) 11/30/2014   Assessment: 84 yo lady s/p fall with h/o afib, DVT/PE and stroke who is on anticoagulation with warfarin.  Her admitting INR was 2.3.  Pharmacy was asked to monitor her anticoagulation. HOME REGIMEN:  Warfarin 2mg  daily  Labs reviewed:  INR = 2.09, no CBC but stable on 11/28  Meds reviewed:  The only home med listed that has potential drug/drug interactions with Warfarin is her Aspirin.  Goal of Therapy:  INR 2-3 Monitor platelets by anticoagulation protocol: Yes   Plan:  Coumadin 2mg  po today Daily PT/INR  Rober Minion, PharmD., MS Clinical Pharmacist Pager:  [phone] Thank you for allowing pharmacy to be part of this patients care team. 06/03/2018,8:15  AM

## 2018-06-03 NOTE — Evaluation (Signed)
Physical Therapy Evaluation Patient Details Name: [name] MRN: [id number] DOB: 11-Aug-1926 Today's Date: 06/03/2018   History of Present Illness  Patient is an 84 y/o female presenting to the ED on 06/01/18 with fall. CT of head and C-spine negative for acute issues. Refusing MRI. Past medical history significant of hypertension, hyperlipidemia, stroke, depression, CKD 3, atrial fibrillation on Coumadin, DVT/PE on Coumadin, thrombocytopenia.     Clinical Impression  [name] admitted with the above listed diagnosis. Patient reports that prior to admission she was ambulatory with RW (unsure of accuracy as no family member is present.) Patient today requiring Mod A for bed level mobility and with Total A unable to clear hips from bed for sit to stand transfer. Will currently recommend SNF at discharge to progress safe mobility. PT to follow acutely.     Follow Up Recommendations SNF;Supervision/Assistance - 24 hour    Equipment Recommendations  Other (comment)(defer)    Recommendations for Other Services       Precautions / Restrictions Precautions Precautions: Fall Restrictions Weight Bearing Restrictions: No      Mobility  Bed Mobility Overal bed mobility: Needs Assistance Bed Mobility: Supine to Sit;Sit to Supine     Supine to sit: Mod assist Sit to supine: Mod assist   General bed mobility comments: use of bed pad to complete transitional movements   Transfers Overall transfer level: Needs assistance Equipment used: Rolling walker (2 wheeled) Transfers: Sit to/from Stand Sit to Stand: Total assist         General transfer comment: unable to clear hips from bed  Ambulation/Gait             General Gait Details: deferred  Stairs            Wheelchair Mobility    Modified Rankin (Stroke Patients Only)       Balance Overall balance assessment: Needs assistance Sitting-balance support: Bilateral upper extremity supported;Feet  supported Sitting balance-Leahy Scale: Fair                                       Pertinent Vitals/Pain Pain Assessment: No/denies pain    Home Living Family/patient expects to be discharged to:: Private residence Living Arrangements: Children Available Help at Discharge: Family Type of Home: House Home Access: Stairs to enter Entrance Stairs-Rails: Right Entrance Stairs-Number of Steps: 17(unsure of accuracy) Home Layout: One level Home Equipment: Environmental consultant - 2 wheels Additional Comments: unsure of accuracy with no family present    Prior Function Level of Independence: Needs assistance   Gait / Transfers Assistance Needed: use of RW  ADL's / Homemaking Assistance Needed: daughter provides assist with cooking, cleaning, ADLs        Hand Dominance        Extremity/Trunk Assessment   Upper Extremity Assessment Upper Extremity Assessment: Defer to OT evaluation    Lower Extremity Assessment Lower Extremity Assessment: Generalized weakness    Cervical / Trunk Assessment Cervical / Trunk Assessment: Kyphotic  Communication      Cognition Arousal/Alertness: Lethargic Behavior During Therapy: WFL for tasks assessed/performed Overall Cognitive Status: No family/caregiver present to determine baseline cognitive functioning                                        General Comments  Exercises     Assessment/Plan    PT Assessment Patient needs continued PT services  PT Problem List Decreased strength;Decreased activity tolerance;Decreased balance;Decreased mobility;Decreased knowledge of use of DME;Decreased safety awareness       PT Treatment Interventions DME instruction;Gait training;Stair training;Functional mobility training;Therapeutic activities;Therapeutic exercise;Balance training;Neuromuscular re-education;Patient/family education    PT Goals (Current goals can be found in the Care Plan section)  Acute Rehab PT  Goals Patient Stated Goal: none stated PT Goal Formulation: With patient Time For Goal Achievement: 06/17/18 Potential to Achieve Goals: Fair    Frequency Min 2X/week   Barriers to discharge        Co-evaluation               AM-PAC PT "6 Clicks" Mobility  Outcome Measure Help needed turning from your back to your side while in a flat bed without using bedrails?: A Lot Help needed moving from lying on your back to sitting on the side of a flat bed without using bedrails?: A Lot Help needed moving to and from a bed to a chair (including a wheelchair)?: Total Help needed standing up from a chair using your arms (e.g., wheelchair or bedside chair)?: Total Help needed to walk in hospital room?: Total Help needed climbing 3-5 steps with a railing? : Total 6 Click Score: 8    End of Session Equipment Utilized During Treatment: Gait belt Activity Tolerance: Patient tolerated treatment well;Patient limited by fatigue Patient left: in bed;with call bell/phone within reach;with bed alarm set Nurse Communication: Mobility status PT Visit Diagnosis: Unsteadiness on feet (R26.81);Other abnormalities of gait and mobility (R26.89)    Time: 0321-2248 PT Time Calculation (min) (ACUTE ONLY): 19 min   Charges:   PT Evaluation $PT Eval Moderate Complexity: 1 Mod         [name], PT, DPT Supplemental Physical Therapist 06/03/18 12:45 PM Pager: [phone] Office: [phone]

## 2018-06-03 NOTE — Consult Note (Signed)
            [facility] CM Primary Care Navigator  06/03/2018  [place] February 03, 1927 [id number]   Seen patient at at the bedside to identify possible discharge needs.  Patient reports that she had "fallen at home". She presented with generalized weakness and fall, was found to have abnormal UA and started on IV antibiotics with urine culture growing E. Coli, which all led to this admission. (mechanically fall, UTI, acute encephalopathy, acute kidney injury, hypovolemic hypernatremia, HTN, right leg weakness). Patient noted to be forgetful at times during this visit.  Patient endorsesDr.Jose Paz with [facility] [employer] as her primary care provider.    [place] [facility] [place] obtain medications withoutdifficulty.  Patient reports thatdaughter ([name])has been managing her medications at [facility] use of "pill box" system filled once a week.  Patient's daughter has beenproviding transportation to her doctors' appointments.  Patient verbalized that she lives with daughter and son in-law Delfino Lovett) who serve as her primary caregivers at home.  Anticipated dischargeplanis skilled nursing facility pertherapy recommendation (SNF-in process).    Patient had voiced understanding to call primary care provider's office whenshe returns back home, for a post discharge follow- up visit within1- 2weeksor sooner if needs arise. Patient letter (with PCP's contact number) was provided asareminder.   Discussed with patient regarding [facility] CM services available for health management and resourcesat homebut indicated  having no needs of services for now. She states that her daughter has been managing her health needs so far. Patient was encouraged totalkwith primary care provider about assistance andfurther management of health issues if any, after her discharge to home.   Patient verbalizedunderstandingto seek referral  from primary care provider to [facility] care management ifdeemed necessary and appropriate for any services in the nearfuture- once she returnsback home.  [facility] care management information was provided for future needs that she may have.   For additional questions please contact:  Edwena Felty A. Ashani Pumphrey, BSN, RN-BC [facility] PRIMARY CARE Navigator Cell: [phone]

## 2018-06-03 NOTE — Progress Notes (Signed)
MRI staff called and asked again about patient  Is she is willing to do MRI again. Patient all day pleasant and cooperative with staff but she stated " she will not go to that mashine again". Staff notified and MD   Venetia Night, RN

## 2018-06-04 ENCOUNTER — Inpatient Hospital Stay (HOSPITAL_COMMUNITY): Payer: Medicare Other

## 2018-06-04 DIAGNOSIS — N3000 Acute cystitis without hematuria: Secondary | ICD-10-CM

## 2018-06-04 LAB — BASIC METABOLIC PANEL
ANION GAP: 14 (ref 5–15)
BUN: 34 mg/dL — ABNORMAL HIGH (ref 8–23)
CALCIUM: 9 mg/dL (ref 8.9–10.3)
CO2: 22 mmol/L (ref 22–32)
Chloride: 108 mmol/L (ref 98–111)
Creatinine, Ser: 1.47 mg/dL — ABNORMAL HIGH (ref 0.44–1.00)
GFR calc Af Amer: 36 mL/min — ABNORMAL LOW (ref >60)
GFR, EST NON AFRICAN AMERICAN: 31 mL/min — AB (ref >60)
Glucose, Bld: 94 mg/dL (ref 70–99)
Potassium: 4.1 mmol/L (ref 3.5–5.1)
Sodium: 144 mmol/L (ref 135–145)

## 2018-06-04 LAB — HEPATIC FUNCTION PANEL
ALT: 21 U/L (ref 0–44)
AST: 41 U/L (ref 15–41)
Albumin: 3.6 g/dL (ref 3.5–5.0)
Alkaline Phosphatase: 31 U/L — ABNORMAL LOW (ref 38–126)
Bilirubin, Direct: 0.3 mg/dL — ABNORMAL HIGH (ref 0.0–0.2)
Indirect Bilirubin: 0.9 mg/dL (ref 0.3–0.9)
Total Bilirubin: 1.2 mg/dL (ref 0.3–1.2)
Total Protein: 5.7 g/dL — ABNORMAL LOW (ref 6.5–8.1)

## 2018-06-04 LAB — PROTIME-INR
INR: 1.83
Prothrombin Time: 20.9 seconds — ABNORMAL HIGH (ref 11.4–15.2)

## 2018-06-04 LAB — URINE CULTURE

## 2018-06-04 LAB — GLUCOSE, CAPILLARY
GLUCOSE-CAPILLARY: 99 mg/dL (ref 70–99)
Glucose-Capillary: 77 mg/dL (ref 70–99)

## 2018-06-04 LAB — TROPONIN I
Troponin I: <0.03 ng/mL (ref <0.03)
Troponin I: <0.03 ng/mL (ref <0.03)

## 2018-06-04 LAB — CK: Total CK: 154 U/L (ref 38–234)

## 2018-06-04 MED ORDER — AMOXICILLIN 500 MG PO CAPS
500.0000 mg | ORAL_CAPSULE | Freq: Two times a day (BID) | ORAL | Status: AC
  Administered 2018-06-04 – 2018-06-06 (×4): 500 mg via ORAL
  Filled 2018-06-04 (×4): qty 1

## 2018-06-04 MED ORDER — NITROGLYCERIN 0.4 MG SL SUBL
0.4000 mg | SUBLINGUAL_TABLET | SUBLINGUAL | Status: DC | PRN
  Administered 2018-06-04: 0.4 mg via SUBLINGUAL
  Filled 2018-06-04 (×3): qty 1

## 2018-06-04 MED ORDER — ISOSORBIDE MONONITRATE ER 30 MG PO TB24
15.0000 mg | ORAL_TABLET | Freq: Every day | ORAL | Status: DC
  Administered 2018-06-04 – 2018-06-06 (×3): 15 mg via ORAL
  Filled 2018-06-04 (×3): qty 1

## 2018-06-04 MED ORDER — WARFARIN SODIUM 3 MG PO TABS
3.0000 mg | ORAL_TABLET | Freq: Once | ORAL | Status: AC
  Administered 2018-06-04: 3 mg via ORAL
  Filled 2018-06-04: qty 1

## 2018-06-04 NOTE — Progress Notes (Signed)
Patient had another episode of chest pain that has resolved with nitroglycerin.  EKG with nonspecific ST depression in lateral precordial leads.  Troponin negative.  Discussed those findings with Dr. Alene Mires (cardiology) who briefly reviewed patient's chart and looked at the EKG, and suggested starting low-dose Imdur at 15 mg daily and getting more troponin.  Imdur 15 mg ordered.  Will cycle troponin.  EKG PRN.

## 2018-06-04 NOTE — Progress Notes (Signed)
1 nitroglycerin tablet given for chest pain, patient now states that chest pain has resolved completely Neta Mends RN 3:22 PM 06-04-2018

## 2018-06-04 NOTE — Evaluation (Signed)
Occupational Therapy Evaluation Patient Details Name: [name] MRN: [id number] DOB: 1926/08/25 Today's Date: 06/04/2018    History of Present Illness Patient is an 84 y/o female presenting to the ED on 06/01/18 with fall. CT of head and C-spine negative for acute issues. Refusing MRI. Past medical history significant of hypertension, hyperlipidemia, stroke, depression, CKD 3, atrial fibrillation on Coumadin, DVT/PE on Coumadin, thrombocytopenia.    Clinical Impression   This 84 yo female admitted with above seen today with limited eval due to with rolling and laying flat to get cleaned up pt with increased work of breathing (dtr reports they are breathing spasms) and increased left chest pain. Recovered with focusing on breathing, but then had to clean pt up again and so happened again. Respiratory in for breathing treatment, NT in for EKG, blood drawn for tropinins. Will continue to follow pt and at this time are recommending SNF for continued rehab for pt to work back towards PLOF,    Follow Up Recommendations  SNF;Supervision/Assistance - 24 hour    Equipment Recommendations  Other (comment)(TBD at next venue)       Precautions / Restrictions Precautions Precautions: Fall Precaution Comments: CP and increased work of breathing with bed mobility to get cleaned up x2 (EKG being checked as well as troponins and breathing treatment being given as well by respective staff)      Mobility Bed Mobility Overal bed mobility: Needs Assistance Bed Mobility: Rolling Rolling: Min assist         General bed mobility comments: right and left (rolling multiple times with use of rail) get cleaned up x2         ADL either performed or assessed with clinical judgement   ADL Overall ADL's : Needs assistance/impaired Eating/Feeding: Set up;Bed level   Grooming: Set up;Bed level   Upper Body Bathing: Set up;Bed level   Lower Body Bathing: Maximal assistance;Bed level   Upper Body  Dressing : Maximal assistance;Bed level   Lower Body Dressing: Total assistance;Bed level                       Vision Patient Visual Report: No change from baseline              Pertinent Vitals/Pain Pain Assessment: 0-10 Pain Score: 10-Worst pain ever Pain Location: left side of chest Pain Descriptors / Indicators: Constant(holding chest) Pain Intervention(s): Limited activity within patient's tolerance;Monitored during session(called RN)     Hand Dominance  right   Extremity/Trunk Assessment Upper Extremity Assessment Upper Extremity Assessment: Generalized weakness           Communication  No issues   Cognition Arousal/Alertness: Awake/alert Behavior During Therapy: WFL for tasks assessed/performed Overall Cognitive Status: Within Functional Limits for tasks assessed                                                Home Living Family/patient expects to be discharged to:: Skilled nursing facility                                                 OT Problem List: Decreased strength;Impaired balance (sitting and/or standing);Pain;Cardiopulmonary status limiting activity      OT  Treatment/Interventions: Self-care/ADL training;Balance training;Therapeutic activities;DME and/or AE instruction;Patient/family education    OT Goals(Current goals can be found in the care plan section) Acute Rehab OT Goals Patient Stated Goal: to get OOB (but could not today due to with bed mobillity pt wtih increased work of breathing and increased CP) OT Goal Formulation: With patient Time For Goal Achievement: 06/18/18 Potential to Achieve Goals: Good  OT Frequency: Min 2X/week              AM-PAC OT "6 Clicks" Daily Activity     Outcome Measure Help from another person eating meals?: A Little Help from another person taking care of personal grooming?: A Little Help from another person toileting, which includes using toliet,  bedpan, or urinal?: Total Help from another person bathing (including washing, rinsing, drying)?: A Lot Help from another person to put on and taking off regular upper body clothing?: A Lot Help from another person to put on and taking off regular lower body clothing?: Total 6 Click Score: 12   End of Session Nurse Communication: (pink pads placed on pt's buttocks due to some skin breakdown and pt c/o bottom sore; red place on pt's right arm, need for breathing treatment, CP getting worse (RN in to see pt))  Activity Tolerance: Patient limited by pain(limited by difficulty breathing) Patient left: in bed;with nursing/sitter in room(staff in room for EKG, breathing treatment)  OT Visit Diagnosis: Other abnormalities of gait and mobility (R26.89);Muscle weakness (generalized) (M62.81);Pain Pain - part of body: (left chest and buttocks)                Time: 3846-6599 OT Time Calculation (min): 40 min Charges:  OT General Charges $OT Visit: 1 Visit OT Evaluation $OT Eval Moderate Complexity: 1 Mod OT Treatments $Self Care/Home Management : 23-37 mins  Golden Circle, OTR/L Acute [employer] Pager [phone] Office [phone]     Almon Register 06/04/2018, 2:57 PM

## 2018-06-04 NOTE — Progress Notes (Addendum)
PROGRESS NOTE  [name] XIP:[id number] DOB: Jun 24, 1927 DOA: 06/01/2018 PCP: Colon Branch, MD   LOS: 2 days   Brief Narrative / Interim history: 84 year old female with a history of hypertension, stroke, CKD, atrial fibrillation on Coumadin with a history of DVT and PE came to the hospital after a fall.  Patient also was found to have abnormal UA and started on IV ceftriaxone.  Urine culture growing pansensitive E. Coli.  Transition from ceftriaxone to Amoxil.   Subjective: Reports left-sided chest pain radiating to his left arm overnight.  Chest pain improved.  Denies associated dyspnea, nausea or diaphoresis.  Continues to refuse MRI and x-ray.  She says she had multiple MRI in her life and does not want anymore.  Assessment & Plan: Principal Problem:   Fall Active Problems:   Essential hypertension   Hx pulmonary embolism   Warfarin anticoagulation   Dyslipidemia   History of CVA (cerebrovascular accident)   UTI (urinary tract infection)   Thrombocytopenia (HCC)   Acute renal failure superimposed on stage 3 chronic kidney disease (HCC)   Chronic atrial fibrillation   Abnormal LFTs   Hallucination   Right leg weakness   Generalized weakness  Chest pain: reports chest pain radiating to her left arm overnight.  No associated dyspnea, nausea or diaphoresis.  Pain improved this morning. -We will check EKG and troponin x1.  Mechanical fall: Multiple imaging including CT head and cervical spine, thoracic spine film, chest x-ray, right-sided pelvic x-ray, and right tibia/fibula film negative for acute osseous finding.  CK elevated to 557. -Evaluated by physical therapy who recommended SNF.  Awaiting placement.  UTI: Urinalysis concerning.  Urine culture grew pansensitive E. Coli. -Transition from ceftriaxone to amoxicillin to complete 5 days course.  AKI on CKD 3: Improved.  Baseline 1-1.3.  Currently 1.47.  Likely due to mild rhabdomyolysis.  CK elevated. -Continue  trending.  Hyponatremia: Resolved. -Discontinue IV 1/2 normal saline -Encourage oral hydration  Hypertension: Normotensive. -Continue metoprolol and verapamil -Holding diuretics.  History of PE/DVT: INR subtherapeutic today. -Warfarin per pharmacy  Chronic atrial fibrillation: Chads vascular score 6 -Warfarin per pharmacy as above -Metoprolol and verapamil for rate control  ?Right leg weakness/history of stroke: Appears resolved.  Symmetric strength in both legs on my exam today.  No pain with leg movements either.  Continues to refuse MRI and pelvic/hip x-ray. -Continue home statin and aspirin -Continue physical therapy  Addendum Elevated liver enzymes.  Pattern consistent with elevated CK -Repeat CK -Repeat LFT Scheduled Meds: . amoxicillin  500 mg Oral Q12H  . aspirin EC  81 mg Oral Daily  . atorvastatin  20 mg Oral Daily  . calcium-vitamin D  1 tablet Oral Q breakfast  . loratadine  10 mg Oral Daily  . metoprolol tartrate  37.5 mg Oral BID  . mometasone-formoterol  2 puff Inhalation BID  . multivitamin with minerals  1 tablet Oral Daily  . QUEtiapine  25-50 mg Oral QHS  . verapamil  120 mg Oral QPM  . warfarin  3 mg Oral ONCE-1800  . Warfarin - Pharmacist Dosing Inpatient   Does not apply q1800   Continuous Infusions: . sodium chloride 75 mL/hr at 06/03/18 0503   PRN Meds:.albuterol, hydrALAZINE, loperamide, ondansetron **OR** ondansetron (ZOFRAN) IV, traMADol, zolpidem  DVT prophylaxis: On warfarin Code Status: Full code Family Communication: No family member at bedside Disposition Plan: Anticipate discharge to SNF when bed is available.  Medically ready.  Consultants:   None  Procedures:  None  Antimicrobials:  Ceftriaxone 05/2016 to 11/28  Amoxicillin 11/30-12/1  Objective: Vitals:   06/03/18 1710 06/03/18 1900 06/04/18 0450 06/04/18 0730  BP: (!) 154/78 (!) 127/96 (!) 149/72   Pulse: 89 72 72   Resp: (!) 22 18 18    Temp: 97.7 F (36.5 C)  97.7 F (36.5 C) (!) 97.3 F (36.3 C)   TempSrc: Oral Oral Oral   SpO2: 94% 97% 95% 96%  Weight:   86.6 kg   Height:        Intake/Output Summary (Last 24 hours) at 06/04/2018 1119 Last data filed at 06/04/2018 0940 Gross per 24 hour  Intake 1350 ml  Output 700 ml  Net 650 ml   Filed Weights   06/02/18 0114 06/03/18 0421 06/04/18 0450  Weight: 91.2 kg 85.7 kg 86.6 kg    Examination:  GENERAL: Elderly female.  Appears well. No acute distress.  HEENT: MMM.  Vision and Hearing grossly intact.  NECK: Supple.  LUNGS:  No IWOB. Good air movement. CTAB.  HEART:  RRR. Heart sounds normal.  ABD: Bowel sounds present. Soft. Non tender.  EXT:   no edema bilaterally.  Extremely enlarged toenails. SKIN: no apparent skin lesion.  NEURO: Awake, alert and oriented appropriately.  No gross deficit.  Moving all extremities. PSYCH: Calm. Normal affect.  Data Reviewed: I have independently reviewed following labs and imaging studies   CBC: Recent Labs  Lab 06/01/18 1726 06/02/18 0223  WBC 7.3 5.9  NEUTROABS 5.7  --   HGB 13.8 12.3  HCT 43.5 38.5  MCV 98.0 96.5  PLT 63* 58*   Basic Metabolic Panel: Recent Labs  Lab 06/01/18 1726 06/02/18 0223 06/03/18 0453 06/04/18 0431  NA 143 146* 144 144  K 4.0 3.9 4.1 4.1  CL 109 114* 113* 108  CO2 25 24 23 22   GLUCOSE 110* 102* 85 94  BUN 60* 50* 43* 34*  CREATININE 2.12* 1.87* 1.70* 1.47*  CALCIUM 9.6 8.9 9.2 9.0   GFR: Estimated Creatinine Clearance: 28.8 mL/min (A) (by C-G formula based on SCr of 1.47 mg/dL (H)). Liver Function Tests: Recent Labs  Lab 06/01/18 1726  AST 64*  ALT 23  ALKPHOS 39  BILITOT 2.0*  PROT 6.7  ALBUMIN 4.5   No results for input(s): LIPASE, AMYLASE in the last 168 hours. No results for input(s): AMMONIA in the last 168 hours. Coagulation Profile: Recent Labs  Lab 06/02/18 0223 06/03/18 0453 06/04/18 0431  INR 2.30 2.09 1.83   Cardiac Enzymes: Recent Labs  Lab 06/01/18 1726  06/02/18 0223  CKTOTAL  --  557*  TROPONINI <0.03  --    BNP (last 3 results) Recent Labs    11/18/17 1228 01/13/18 1620  PROBNP 1,330* 821*   HbA1C: No results for input(s): HGBA1C in the last 72 hours. CBG: Recent Labs  Lab 06/03/18 2109 06/04/18 0757  GLUCAP 122* 77   Lipid Profile: No results for input(s): CHOL, HDL, LDLCALC, TRIG, CHOLHDL, LDLDIRECT in the last 72 hours. Thyroid Function Tests: No results for input(s): TSH, T4TOTAL, FREET4, T3FREE, THYROIDAB in the last 72 hours. Anemia Panel: No results for input(s): VITAMINB12, FOLATE, FERRITIN, TIBC, IRON, RETICCTPCT in the last 72 hours. Urine analysis:    Component Value Date/Time   COLORURINE YELLOW 06/01/2018 1726   APPEARANCEUR CLEAR 06/01/2018 1726   APPEARANCEUR Cloudy (A) 01/13/2018 1619   LABSPEC 1.010 06/01/2018 1726   PHURINE 6.5 06/01/2018 1726   GLUCOSEU NEGATIVE 06/01/2018 1726   GLUCOSEU NEGATIVE 08/01/2014 1555  HGBUR TRACE (A) 06/01/2018 1726   BILIRUBINUR NEGATIVE 06/01/2018 1726   BILIRUBINUR Negative 01/13/2018 1619   KETONESUR NEGATIVE 06/01/2018 1726   PROTEINUR NEGATIVE 06/01/2018 1726   UROBILINOGEN 1.0 08/01/2014 1555   NITRITE POSITIVE (A) 06/01/2018 1726   LEUKOCYTESUR TRACE (A) 06/01/2018 1726   LEUKOCYTESUR Trace (A) 01/13/2018 1619   Sepsis Labs: Invalid input(s): PROCALCITONIN, LACTICIDVEN  Recent Results (from the past 240 hour(s))  Urine culture     Status: Abnormal   Collection Time: 06/01/18  5:26 PM  Result Value Ref Range Status   Specimen Description   Final    URINE, CATHETERIZED Performed at [facility], [address] [place], [place] [address]    Special Requests   Final    NONE Performed at [facility], [address] [place], [place] [address]    Culture >=100,000 COLONIES/mL ESCHERICHIA COLI (A)  Final   Report Status 06/04/2018 FINAL  Final   Organism ID, Bacteria ESCHERICHIA COLI (A)  Final      Susceptibility    Escherichia coli - MIC*    AMPICILLIN 4 SENSITIVE Sensitive     CEFAZOLIN <=4 SENSITIVE Sensitive     CEFTRIAXONE <=1 SENSITIVE Sensitive     CIPROFLOXACIN <=0.25 SENSITIVE Sensitive     GENTAMICIN <=1 SENSITIVE Sensitive     IMIPENEM <=0.25 SENSITIVE Sensitive     NITROFURANTOIN <=16 SENSITIVE Sensitive     TRIMETH/SULFA <=20 SENSITIVE Sensitive     AMPICILLIN/SULBACTAM <=2 SENSITIVE Sensitive     PIP/TAZO <=4 SENSITIVE Sensitive     Extended ESBL NEGATIVE Sensitive     * >=100,000 COLONIES/mL ESCHERICHIA COLI      Radiology Studies: No results found.    Natori Gudino T. [facility] Pager [phone]  If 7PM-7AM, please contact night-coverage [email or web address] Password [email or web address] 06/04/2018, 11:19 AM

## 2018-06-04 NOTE — Progress Notes (Signed)
Patient with complaints of left  chest pain 9/10, does not radiate, no nausea. MD notified and EKG ordered and troponin once, patient has just had her breathing treatment and says her chest is better now, nitro ordered just in case patient needs in the future Neta Mends RN 2:58 PM 06-04-2018

## 2018-06-04 NOTE — Progress Notes (Signed)
ANTICOAGULATION CONSULT NOTE - Consult  Pharmacy Consult for Coumadin Indications: DVT, afib  No Known Allergies  Patient Measurements: Height: 5\' 6"  (167.6 cm) Weight: 191 lb (86.6 kg) IBW/kg (Calculated) : 59.3   Vital Signs: Temp: 97.3 F (36.3 C) (11/30 0450) Temp Source: Oral (11/30 0450) BP: 149/72 (11/30 0450) Pulse Rate: 72 (11/30 0450)  Labs: Recent Labs    06/01/18 1726 06/02/18 0223 06/03/18 0453 06/04/18 0431  HGB 13.8 12.3  --   --   HCT 43.5 38.5  --   --   PLT 63* 58*  --   --   LABPROT  --  25.0* 23.2* 20.9*  INR  --  2.30 2.09 1.83  CREATININE 2.12* 1.87* 1.70* 1.47*  CKTOTAL  --  557*  --   --   TROPONINI <0.03  --   --   --    Estimated Creatinine Clearance: 28.8 mL/min (A) (by C-G formula based on SCr of 1.47 mg/dL (H)).  Medical History: Past Medical History:  Diagnosis Date  . Acute encephalopathy   . Acute renal failure superimposed on stage 3 chronic kidney disease ([place])   . Anemia   . Atrial fibrillation ([place])   . Colon cancer ([place]) 12/11  . Colon cancer ([place]) 2011  . Diarrhea   . DVT (deep venous thrombosis) ([place]) 11/06  . Elevated brain natriuretic peptide (BNP) level   . Frequent headaches   . Heart murmur   . Hyperkalemia   . Hypertension   . Pulmonary embolism ([place]) 11/06  . Seizures ([place])   . Stroke [facility]) 11/30/2014   Assessment: 84 yo F s/p fall with h/o afib, DVT/PE and stroke who is on anticoagulation with warfarin.  Her admitting INR was 2.3.   HOME REGIMEN:  Warfarin 2 mg daily (last PTA dose taken 11/26 per med rec)  INR = 1.83 (down from 2.09 yesterday) - may be down since warfarin was resumed on 11/28  Last CBC on 11/28  Goal of Therapy:  INR 2-3 Monitor platelets by anticoagulation protocol: Yes   Plan:  Warfarin 3 mg x 1 tonight Daily PT/INR, CBC tomorrow with AM labs  Vertis Kelch, PharmD PGY1 Pharmacy Resident Phone [phone] 06/04/2018       10:07 AM

## 2018-06-05 DIAGNOSIS — I272 Pulmonary hypertension, unspecified: Secondary | ICD-10-CM

## 2018-06-05 DIAGNOSIS — I5032 Chronic diastolic (congestive) heart failure: Secondary | ICD-10-CM

## 2018-06-05 LAB — CBC
HCT: 36.4 % (ref 36.0–46.0)
Hemoglobin: 11.3 g/dL — ABNORMAL LOW (ref 12.0–15.0)
MCH: 30.5 pg (ref 26.0–34.0)
MCHC: 31 g/dL (ref 30.0–36.0)
MCV: 98.1 fL (ref 80.0–100.0)
Platelets: 56 10*3/uL — ABNORMAL LOW (ref 150–400)
RBC: 3.71 MIL/uL — ABNORMAL LOW (ref 3.87–5.11)
RDW: 13.8 % (ref 11.5–15.5)
WBC: 3.8 10*3/uL — ABNORMAL LOW (ref 4.0–10.5)
nRBC: 0 % (ref 0.0–0.2)

## 2018-06-05 LAB — BASIC METABOLIC PANEL
Anion gap: 9 (ref 5–15)
BUN: 29 mg/dL — AB (ref 8–23)
CO2: 24 mmol/L (ref 22–32)
Calcium: 8.8 mg/dL — ABNORMAL LOW (ref 8.9–10.3)
Chloride: 112 mmol/L — ABNORMAL HIGH (ref 98–111)
Creatinine, Ser: 1.42 mg/dL — ABNORMAL HIGH (ref 0.44–1.00)
GFR calc Af Amer: 38 mL/min — ABNORMAL LOW (ref >60)
GFR calc non Af Amer: 33 mL/min — ABNORMAL LOW (ref >60)
Glucose, Bld: 90 mg/dL (ref 70–99)
Potassium: 4.4 mmol/L (ref 3.5–5.1)
SODIUM: 145 mmol/L (ref 135–145)

## 2018-06-05 LAB — PROTIME-INR
INR: 1.99
Prothrombin Time: 22.3 seconds — ABNORMAL HIGH (ref 11.4–15.2)

## 2018-06-05 LAB — TROPONIN I: Troponin I: <0.03 ng/mL (ref <0.03)

## 2018-06-05 MED ORDER — WARFARIN SODIUM 2 MG PO TABS
2.0000 mg | ORAL_TABLET | Freq: Once | ORAL | Status: AC
  Administered 2018-06-05: 2 mg via ORAL
  Filled 2018-06-05: qty 1

## 2018-06-05 NOTE — Progress Notes (Signed)
Patient resting comfortably during shift report. Denies complaints.  

## 2018-06-05 NOTE — Clinical Social Work Note (Signed)
Clinical Social Work Assessment  Patient Details  Name: [name] MRN: [id number] Date of Birth: 17-Feb-1927  Date of referral:  06/05/18               Reason for consult:  Discharge Planning                Permission sought to share information with:  Facility Sport and exercise psychologist Permission granted to share information::  Yes, Verbal Permission Granted  Name::        Agency::     Relationship::  [name],[name] Daughter [phone]   Contact Information:     Housing/Transportation Living arrangements for the past 2 months:  [place] of Information:  Adult Children Patient Interpreter Needed:  None Criminal Activity/Legal Involvement Pertinent to Current Situation/Hospitalization:  No - Comment as needed Significant Relationships:  Adult Children Lives with:  Adult Children Do you feel safe going back to the place where you live?  Yes Need for family participation in patient care:  Yes (Comment)  Care giving concerns:  CSW spoke with daughter via telephone and will be assisting with discharge planning once medically stable.    Social Worker assessment / plan:  CSW spoke with pt's daughter via telephone. Pt has been to [facility] and prefers rehab there. Pt lives with adult daughter. Pt's daughter agreeble to list. CSW emailed list to pt's daughter.  Employment status:  Disabled (Comment on whether or not currently receiving Disability) Insurance information:  Medicare PT Recommendations:  [place] / Referral to community resources:  [place]  Patient/Family's Response to care:  Pt daughter verbalized understanding of CSW role and expressed appreciation for support. Pt's dauhgter denies any concerns regarding pt care at this time.    Patient/Family's Understanding of and Emotional Response to Diagnosis, Current Treatment, and Prognosis:  Pt's daughter understanding and realistic regarding pt's physical  limitations. Pt's daughter understands the need for pt to go to SNF at discharge. Pt's daughter agreeable at  this ime. Pt's daughter denies any concerns regarding treatment plan at this time. CSW will continue to provide support and facilitate d/c needs.   Emotional Assessment Appearance:  Appears stated age Attitude/Demeanor/Rapport:    Affect (typically observed):  Unable to Assess Orientation:  Oriented to Self, Oriented to Place, Oriented to  Time, Oriented to Situation Alcohol / Substance use:  Not Applicable Psych involvement (Current and /or in the community):  No (Comment)  Discharge Needs  Concerns to be addressed:  Discharge Planning Concerns Readmission within the last 30 days:  No Current discharge risk:  None Barriers to Discharge:  No Barriers Identified   Rehmat Murtagh, LCSW 06/05/2018, 9:23 AM

## 2018-06-05 NOTE — Progress Notes (Signed)
ANTICOAGULATION CONSULT NOTE - Consult  Pharmacy Consult for Coumadin Indications: DVT, afib  No Known Allergies  Patient Measurements: Height: 5\' 6"  (167.6 cm) Weight: 194 lb (88 kg) IBW/kg (Calculated) : 59.3   Vital Signs: Temp: 97.8 F (36.6 C) (12/01 0508) Temp Source: Oral (12/01 0508) BP: 146/87 (12/01 0916) Pulse Rate: 64 (12/01 0916)  Labs: Recent Labs    06/03/18 0453 06/04/18 0431 06/04/18 0731 06/04/18 1428 06/04/18 1818 06/05/18 0030 06/05/18 0542  HGB  --   --   --   --   --   --  11.3*  HCT  --   --   --   --   --   --  36.4  PLT  --   --   --   --   --   --  56*  LABPROT 23.2* 20.9*  --   --   --   --  22.3*  INR 2.09 1.83  --   --   --   --  1.99  CREATININE 1.70* 1.47*  --   --   --   --  1.42*  CKTOTAL  --   --  154  --   --   --   --   TROPONINI  --   --   --  <0.03 <0.03 <0.03  --    Estimated Creatinine Clearance: 30 mL/min (A) (by C-G formula based on SCr of 1.42 mg/dL (H)).  Medical History: Past Medical History:  Diagnosis Date  . Acute encephalopathy   . Acute renal failure superimposed on stage 3 chronic kidney disease ([place])   . Anemia   . Atrial fibrillation ([place])   . Colon cancer ([place]) 12/11  . Colon cancer ([place]) 2011  . Diarrhea   . DVT (deep venous thrombosis) ([place]) 11/06  . Elevated brain natriuretic peptide (BNP) level   . Frequent headaches   . Heart murmur   . Hyperkalemia   . Hypertension   . Pulmonary embolism ([place]) 11/06  . Seizures ([place])   . Stroke [facility]) 11/30/2014   Assessment: 84 yo F s/p fall with h/o afib, DVT/PE and stroke who is on anticoagulation with warfarin.  Her admitting INR was 2.3.   HOME REGIMEN:  Warfarin 2 mg daily (last PTA dose taken 11/26 per med rec)  INR = 1.99, CBC relatively stable from 11/28  Goal of Therapy:  INR 2-3 Monitor platelets by anticoagulation protocol: Yes   Plan:  Warfarin 2 mg x 1 tonight Daily PT/INR Monitor for signs/symptoms of bleeding  Vertis Kelch,  PharmD PGY1 Pharmacy Resident Phone [phone] 06/05/2018       9:28 AM

## 2018-06-05 NOTE — Progress Notes (Addendum)
PROGRESS NOTE  [name]:[id number] DOB: 1927/05/05 DOA: 06/01/2018 PCP: Colon Branch, MD   LOS: 3 days   Brief Narrative / Interim history: 84 year old female with a history of hypertension, stroke, CKD, atrial fibrillation on Coumadin with a history of DVT and PE came to the hospital after a fall.  Patient also was found to have abnormal UA and started on IV ceftriaxone.  Urine culture growing pansensitive E. Coli.  Transition from ceftriaxone to Amoxil.   Subjective: No major events overnight.  No complaints this morning.  Reports having a good night.  Denies chest pain, dyspnea, abdominal pain or dysuria.   Assessment & Plan: Principal Problem:   Fall Active Problems:   Essential hypertension   Hx pulmonary embolism   Warfarin anticoagulation   Dyslipidemia   History of CVA (cerebrovascular accident)   UTI (urinary tract infection)   Thrombocytopenia (HCC)   Acute renal failure superimposed on stage 3 chronic kidney disease (HCC)   Chronic atrial fibrillation   Abnormal LFTs   Hallucination   Right leg weakness   Generalized weakness  Chest pain: Resolved.  EKG with some ST depression in lateral precordial leads 11/29  Serial troponin remained negative.  This was discussed with cardiology (Dr. Sherryle Lis) over the phone who recommended iodine indoor at 15 mg daily. -Continue metoprolol, Imdur, atorvastatin, warfarin and as needed nitroglycerin.  Diastolic CHF/pulmonary hypertension: Echo in 11/2017 with EF of 65 to 70%, moderate LVH, severely dilated LA and RA, moderate TV regurg and PAPP of 90 mmhg. has some wheezing and dependent edema today. -Resume home Lasix -Monitor daily weight, intake output and renal function.  Mechanical fall: Multiple imaging including CT head and cervical spine, thoracic spine film, chest x-ray, right-sided pelvic x-ray, and right tibia/fibula film negative for acute osseous finding.  CK elevated to 557. -Evaluated by physical therapy who  recommended SNF.  Awaiting placement. -Elevated CT resolved  Elevated liver enzymes.  Pattern consistent with elevated CK.  Resolved.  UTI: Urinalysis concerning.  Urine culture grew pansensitive E. Coli. -Ceftriaxone 05/2016 to 11/28 -Amoxicillin 11/30-12/2  AKI on CKD 3: Improved.  Baseline 1-1.3.  Likely due to mild rhabdomyolysis.  -Continue trending.  Hyponatremia: Resolved. -Discontinue IV 1/2 normal saline -Encourage oral hydration  Hypertension: Normotensive. -Continue metoprolol and verapamil -Holding diuretics.  History of PE/DVT: INR subtherapeutic. -Warfarin per pharmacy  Chronic atrial fibrillation: Chads vascular score 6 -Warfarin per pharmacy as above -Metoprolol and verapamil for rate control  ?Right leg weakness/history of stroke: Appears resolved.  Symmetric strength in both legs on my exam today.  No pain with leg movements either.  Continues to refuse MRI and pelvic/hip x-ray. -Continue home statin and aspirin -Continue physical therapy  Chronic thrombocytopenia: Unknown etiology.  No history of ITP or liver disease in the chart.  Patient is on Coumadin. -Continue monitoring  Scheduled Meds: . amoxicillin  500 mg Oral Q12H  . aspirin EC  81 mg Oral Daily  . atorvastatin  20 mg Oral Daily  . calcium-vitamin D  1 tablet Oral Q breakfast  . isosorbide mononitrate  15 mg Oral Daily  . loratadine  10 mg Oral Daily  . metoprolol tartrate  37.5 mg Oral BID  . mometasone-formoterol  2 puff Inhalation BID  . multivitamin with minerals  1 tablet Oral Daily  . QUEtiapine  25-50 mg Oral QHS  . verapamil  120 mg Oral QPM  . warfarin  2 mg Oral ONCE-1800  . Warfarin - Pharmacist Dosing Inpatient  Does not apply q1800   Continuous Infusions:  PRN Meds:.albuterol, hydrALAZINE, loperamide, nitroGLYCERIN, ondansetron **OR** ondansetron (ZOFRAN) IV, traMADol, zolpidem  DVT prophylaxis: On warfarin Code Status: Full code Family Communication: No family member  at bedside.  Called and updated patient's daughter. Disposition Plan: Anticipate discharge to SNF when bed is available.  Medically ready.  Consultants:   None  Procedures:   None  Antimicrobials:  Ceftriaxone 05/2016 to 11/28  Amoxicillin 11/30-12/2 Objective: Vitals:   06/04/18 2144 06/04/18 2300 06/05/18 0508 06/05/18 0916  BP: 116/60 130/82 136/71 (!) 146/87  Pulse: (!) 55 66 61 64  Resp: 14  16   Temp: (!) 97.4 F (36.3 C)  97.8 F (36.6 C)   TempSrc: Oral  Oral   SpO2: 97% 95% 93%   Weight:   88 kg   Height:        Intake/Output Summary (Last 24 hours) at 06/05/2018 1312 Last data filed at 06/04/2018 2200 Gross per 24 hour  Intake 480 ml  Output 550 ml  Net -70 ml   Filed Weights   06/03/18 0421 06/04/18 0450 06/05/18 0508  Weight: 85.7 kg 86.6 kg 88 kg    Examination: GENERAL: Appears well. No acute distress.  HEENT: MMM.  Vision and Hearing grossly intact.  NECK: Supple.  LUNGS:  No IWOB. Good air movement.  Expiratory wheezing. HEART:  RRR. Heart sounds normal.  ABD: Bowel sounds present. Soft. Non tender.  EXT: Trace dependent edema. SKIN: no apparent skin lesion.  NEURO: Awake, alert and oriented appropriately.  No gross deficit.  PSYCH: Calm. Normal affect.  Data Reviewed: I have independently reviewed following labs and imaging studies   CBC: Recent Labs  Lab 06/01/18 1726 06/02/18 0223 06/05/18 0542  WBC 7.3 5.9 3.8*  NEUTROABS 5.7  --   --   HGB 13.8 12.3 11.3*  HCT 43.5 38.5 36.4  MCV 98.0 96.5 98.1  PLT 63* 58* 56*   Basic Metabolic Panel: Recent Labs  Lab 06/01/18 1726 06/02/18 0223 06/03/18 0453 06/04/18 0431 06/05/18 0542  NA 143 146* 144 144 145  K 4.0 3.9 4.1 4.1 4.4  CL 109 114* 113* 108 112*  CO2 25 24 23 22 24   GLUCOSE 110* 102* 85 94 90  BUN 60* 50* 43* 34* 29*  CREATININE 2.12* 1.87* 1.70* 1.47* 1.42*  CALCIUM 9.6 8.9 9.2 9.0 8.8*   GFR: Estimated Creatinine Clearance: 30 mL/min (A) (by C-G formula based  on SCr of 1.42 mg/dL (H)). Liver Function Tests: Recent Labs  Lab 06/01/18 1726 06/04/18 0731  AST 64* 41  ALT 23 21  ALKPHOS 39 31*  BILITOT 2.0* 1.2  PROT 6.7 5.7*  ALBUMIN 4.5 3.6   No results for input(s): LIPASE, AMYLASE in the last 168 hours. No results for input(s): AMMONIA in the last 168 hours. Coagulation Profile: Recent Labs  Lab 06/02/18 0223 06/03/18 0453 06/04/18 0431 06/05/18 0542  INR 2.30 2.09 1.83 1.99   Cardiac Enzymes: Recent Labs  Lab 06/01/18 1726 06/02/18 0223 06/04/18 0731 06/04/18 1428 06/04/18 1818 06/05/18 0030  CKTOTAL  --  557* 154  --   --   --   TROPONINI <0.03  --   --  <0.03 <0.03 <0.03   BNP (last 3 results) Recent Labs    11/18/17 1228 01/13/18 1620  PROBNP 1,330* 821*   HbA1C: No results for input(s): HGBA1C in the last 72 hours. CBG: Recent Labs  Lab 06/03/18 2109 06/04/18 0757 06/04/18 1144  GLUCAP 122* 77  99   Lipid Profile: No results for input(s): CHOL, HDL, LDLCALC, TRIG, CHOLHDL, LDLDIRECT in the last 72 hours. Thyroid Function Tests: No results for input(s): TSH, T4TOTAL, FREET4, T3FREE, THYROIDAB in the last 72 hours. Anemia Panel: No results for input(s): VITAMINB12, FOLATE, FERRITIN, TIBC, IRON, RETICCTPCT in the last 72 hours. Urine analysis:    Component Value Date/Time   COLORURINE YELLOW 06/01/2018 1726   APPEARANCEUR CLEAR 06/01/2018 1726   APPEARANCEUR Cloudy (A) 01/13/2018 1619   LABSPEC 1.010 06/01/2018 1726   PHURINE 6.5 06/01/2018 1726   GLUCOSEU NEGATIVE 06/01/2018 1726   GLUCOSEU NEGATIVE 08/01/2014 1555   HGBUR TRACE (A) 06/01/2018 1726   BILIRUBINUR NEGATIVE 06/01/2018 1726   BILIRUBINUR Negative 01/13/2018 1619   KETONESUR NEGATIVE 06/01/2018 1726   PROTEINUR NEGATIVE 06/01/2018 1726   UROBILINOGEN 1.0 08/01/2014 1555   NITRITE POSITIVE (A) 06/01/2018 1726   LEUKOCYTESUR TRACE (A) 06/01/2018 1726   LEUKOCYTESUR Trace (A) 01/13/2018 1619   Sepsis Labs: Invalid input(s):  PROCALCITONIN, LACTICIDVEN  Recent Results (from the past 240 hour(s))  Urine culture     Status: Abnormal   Collection Time: 06/01/18  5:26 PM  Result Value Ref Range Status   Specimen Description   Final    URINE, CATHETERIZED Performed at [facility], [address] [place], [place] [address]    Special Requests   Final    NONE Performed at [facility], [address] [place], [place] [address]    Culture >=100,000 COLONIES/mL ESCHERICHIA COLI (A)  Final   Report Status 06/04/2018 FINAL  Final   Organism ID, Bacteria ESCHERICHIA COLI (A)  Final      Susceptibility   Escherichia coli - MIC*    AMPICILLIN 4 SENSITIVE Sensitive     CEFAZOLIN <=4 SENSITIVE Sensitive     CEFTRIAXONE <=1 SENSITIVE Sensitive     CIPROFLOXACIN <=0.25 SENSITIVE Sensitive     GENTAMICIN <=1 SENSITIVE Sensitive     IMIPENEM <=0.25 SENSITIVE Sensitive     NITROFURANTOIN <=16 SENSITIVE Sensitive     TRIMETH/SULFA <=20 SENSITIVE Sensitive     AMPICILLIN/SULBACTAM <=2 SENSITIVE Sensitive     PIP/TAZO <=4 SENSITIVE Sensitive     Extended ESBL NEGATIVE Sensitive     * >=100,000 COLONIES/mL ESCHERICHIA COLI      Radiology Studies: No results found.    [facility] Pager [phone]  If 7PM-7AM, please contact night-coverage [email or web address] Password [email or web address] 06/05/2018, 1:12 PM

## 2018-06-06 ENCOUNTER — Telehealth: Payer: Self-pay

## 2018-06-06 ENCOUNTER — Ambulatory Visit: Payer: Medicare Other

## 2018-06-06 DIAGNOSIS — N179 Acute kidney failure, unspecified: Secondary | ICD-10-CM | POA: Diagnosis not present

## 2018-06-06 DIAGNOSIS — Z7901 Long term (current) use of anticoagulants: Secondary | ICD-10-CM | POA: Diagnosis not present

## 2018-06-06 DIAGNOSIS — R2681 Unsteadiness on feet: Secondary | ICD-10-CM | POA: Diagnosis not present

## 2018-06-06 DIAGNOSIS — Z86711 Personal history of pulmonary embolism: Secondary | ICD-10-CM | POA: Diagnosis not present

## 2018-06-06 DIAGNOSIS — Z8673 Personal history of transient ischemic attack (TIA), and cerebral infarction without residual deficits: Secondary | ICD-10-CM | POA: Diagnosis not present

## 2018-06-06 DIAGNOSIS — R0602 Shortness of breath: Secondary | ICD-10-CM | POA: Diagnosis not present

## 2018-06-06 DIAGNOSIS — I25118 Atherosclerotic heart disease of native coronary artery with other forms of angina pectoris: Secondary | ICD-10-CM | POA: Diagnosis not present

## 2018-06-06 DIAGNOSIS — F4320 Adjustment disorder, unspecified: Secondary | ICD-10-CM | POA: Diagnosis not present

## 2018-06-06 DIAGNOSIS — I10 Essential (primary) hypertension: Secondary | ICD-10-CM | POA: Diagnosis not present

## 2018-06-06 DIAGNOSIS — R488 Other symbolic dysfunctions: Secondary | ICD-10-CM | POA: Diagnosis not present

## 2018-06-06 DIAGNOSIS — R600 Localized edema: Secondary | ICD-10-CM | POA: Diagnosis not present

## 2018-06-06 DIAGNOSIS — R531 Weakness: Secondary | ICD-10-CM | POA: Diagnosis not present

## 2018-06-06 DIAGNOSIS — I5032 Chronic diastolic (congestive) heart failure: Secondary | ICD-10-CM | POA: Diagnosis not present

## 2018-06-06 DIAGNOSIS — I482 Chronic atrial fibrillation, unspecified: Secondary | ICD-10-CM | POA: Diagnosis not present

## 2018-06-06 DIAGNOSIS — Z9181 History of falling: Secondary | ICD-10-CM | POA: Diagnosis not present

## 2018-06-06 DIAGNOSIS — R410 Disorientation, unspecified: Secondary | ICD-10-CM | POA: Diagnosis not present

## 2018-06-06 DIAGNOSIS — R079 Chest pain, unspecified: Secondary | ICD-10-CM | POA: Diagnosis not present

## 2018-06-06 DIAGNOSIS — D649 Anemia, unspecified: Secondary | ICD-10-CM | POA: Diagnosis not present

## 2018-06-06 DIAGNOSIS — B9620 Unspecified Escherichia coli [E. coli] as the cause of diseases classified elsewhere: Secondary | ICD-10-CM | POA: Diagnosis not present

## 2018-06-06 DIAGNOSIS — Z7401 Bed confinement status: Secondary | ICD-10-CM | POA: Diagnosis not present

## 2018-06-06 DIAGNOSIS — D696 Thrombocytopenia, unspecified: Secondary | ICD-10-CM | POA: Diagnosis not present

## 2018-06-06 DIAGNOSIS — I69828 Other speech and language deficits following other cerebrovascular disease: Secondary | ICD-10-CM | POA: Diagnosis not present

## 2018-06-06 DIAGNOSIS — I270 Primary pulmonary hypertension: Secondary | ICD-10-CM | POA: Diagnosis not present

## 2018-06-06 DIAGNOSIS — Z5181 Encounter for therapeutic drug level monitoring: Secondary | ICD-10-CM | POA: Diagnosis not present

## 2018-06-06 DIAGNOSIS — R443 Hallucinations, unspecified: Secondary | ICD-10-CM | POA: Diagnosis not present

## 2018-06-06 DIAGNOSIS — E785 Hyperlipidemia, unspecified: Secondary | ICD-10-CM | POA: Diagnosis not present

## 2018-06-06 DIAGNOSIS — L03115 Cellulitis of right lower limb: Secondary | ICD-10-CM | POA: Diagnosis not present

## 2018-06-06 DIAGNOSIS — R569 Unspecified convulsions: Secondary | ICD-10-CM | POA: Diagnosis not present

## 2018-06-06 DIAGNOSIS — J45909 Unspecified asthma, uncomplicated: Secondary | ICD-10-CM | POA: Diagnosis not present

## 2018-06-06 DIAGNOSIS — M2550 Pain in unspecified joint: Secondary | ICD-10-CM | POA: Diagnosis not present

## 2018-06-06 DIAGNOSIS — R29898 Other symptoms and signs involving the musculoskeletal system: Secondary | ICD-10-CM | POA: Diagnosis not present

## 2018-06-06 DIAGNOSIS — N189 Chronic kidney disease, unspecified: Secondary | ICD-10-CM | POA: Diagnosis not present

## 2018-06-06 DIAGNOSIS — W19XXXA Unspecified fall, initial encounter: Secondary | ICD-10-CM | POA: Diagnosis not present

## 2018-06-06 DIAGNOSIS — N182 Chronic kidney disease, stage 2 (mild): Secondary | ICD-10-CM | POA: Diagnosis not present

## 2018-06-06 DIAGNOSIS — I129 Hypertensive chronic kidney disease with stage 1 through stage 4 chronic kidney disease, or unspecified chronic kidney disease: Secondary | ICD-10-CM | POA: Diagnosis not present

## 2018-06-06 DIAGNOSIS — N183 Chronic kidney disease, stage 3 (moderate): Secondary | ICD-10-CM | POA: Diagnosis not present

## 2018-06-06 DIAGNOSIS — N390 Urinary tract infection, site not specified: Secondary | ICD-10-CM | POA: Diagnosis not present

## 2018-06-06 DIAGNOSIS — M6281 Muscle weakness (generalized): Secondary | ICD-10-CM | POA: Diagnosis not present

## 2018-06-06 DIAGNOSIS — I4811 Longstanding persistent atrial fibrillation: Secondary | ICD-10-CM | POA: Diagnosis not present

## 2018-06-06 DIAGNOSIS — R945 Abnormal results of liver function studies: Secondary | ICD-10-CM | POA: Diagnosis not present

## 2018-06-06 DIAGNOSIS — I69311 Memory deficit following cerebral infarction: Secondary | ICD-10-CM | POA: Diagnosis not present

## 2018-06-06 DIAGNOSIS — I159 Secondary hypertension, unspecified: Secondary | ICD-10-CM | POA: Diagnosis not present

## 2018-06-06 DIAGNOSIS — I4891 Unspecified atrial fibrillation: Secondary | ICD-10-CM | POA: Diagnosis not present

## 2018-06-06 DIAGNOSIS — N3000 Acute cystitis without hematuria: Secondary | ICD-10-CM | POA: Diagnosis not present

## 2018-06-06 LAB — PROTIME-INR
INR: 1.88
Prothrombin Time: 21.4 seconds — ABNORMAL HIGH (ref 11.4–15.2)

## 2018-06-06 MED ORDER — WARFARIN SODIUM 3 MG PO TABS: 3.0000 mg | ORAL_TABLET | Freq: Once | ORAL | Status: DC

## 2018-06-06 MED ORDER — ISOSORBIDE MONONITRATE ER 30 MG PO TB24: 15.0000 mg | ORAL_TABLET | Freq: Every day | ORAL | 0 refills | Status: DC

## 2018-06-06 MED ORDER — VERAPAMIL HCL ER 120 MG PO TBCR: 120.0000 mg | EXTENDED_RELEASE_TABLET | Freq: Every day | ORAL | 0 refills | Status: DC

## 2018-06-06 MED ORDER — ADULT MULTIVITAMIN W/MINERALS CH: 1.0000 | ORAL_TABLET | Freq: Every day | ORAL | 0 refills | Status: DC

## 2018-06-06 MED ORDER — CALCIUM CARBONATE-VITAMIN D 500-200 MG-UNIT PO TABS: 1.0000 | ORAL_TABLET | Freq: Every day | ORAL | 0 refills | Status: DC

## 2018-06-06 MED ORDER — NITROGLYCERIN 0.4 MG SL SUBL: 0.4000 mg | SUBLINGUAL_TABLET | SUBLINGUAL | 0 refills | Status: DC | PRN

## 2018-06-06 NOTE — Clinical Social Work Placement (Addendum)
   CLINICAL SOCIAL WORK PLACEMENT  NOTE  Date:  06/06/2018  Patient Details  Name: [name] MRN: [id number] Date of Birth: [birth year]  Clinical Social Work is seeking post-discharge placement for this patient at the [place] level of care (*CSW will initial, date and re-position this form in  chart as items are completed):  Yes   Patient/family provided with [place] Work Department's list of facilities offering this level of care within the geographic area requested by the patient (or if unable, by the patient's family).  Yes   Patient/family informed of their freedom to choose among providers that offer the needed level of care, that participate in Medicare, Medicaid or managed care program needed by the patient, have an available bed and are willing to accept the patient.  Yes   Patient/family informed of [facility]'s ownership interest in [facility] and [facility], as well as of the fact that they are under no obligation to receive care at these facilities.  PASRR submitted to EDS on       PASRR number received on       Existing PASRR number confirmed on       FL2 transmitted to all facilities in geographic area requested by pt/family on       FL2 transmitted to all facilities within larger geographic area on       Patient informed that his/her managed care company has contracts with or will negotiate with certain facilities, including the following:        Yes   Patient/family informed of bed offers received.  Patient chooses bed at [facility]     Physician recommends and patient chooses bed at      Patient to be transferred to [facility] on 06/06/18.  Patient to be transferred to facility by [facility]     Patient family notified on 06/06/18 of transfer.  Name of family member notified:  [name]  PHYSICIAN       Additional Comment:     _______________________________________________ Candie Chroman, LCSW 06/06/2018, 12:48 PM

## 2018-06-06 NOTE — Telephone Encounter (Signed)
FYI

## 2018-06-06 NOTE — Telephone Encounter (Signed)
Noted  

## 2018-06-06 NOTE — Clinical Social Work Placement (Deleted)
   CLINICAL SOCIAL WORK PLACEMENT  NOTE  Date:  06/06/2018  Patient Details  Name: [name] MRN: [id number] Date of Birth: 03/13/1927  Clinical Social Work is seeking post-discharge placement for this patient at the [place] level of care (*CSW will initial, date and re-position this form in  chart as items are completed):  Yes   Patient/family provided with [place] Work Department's list of facilities offering this level of care within the geographic area requested by the patient (or if unable, by the patient's family).  Yes   Patient/family informed of their freedom to choose among providers that offer the needed level of care, that participate in Medicare, Medicaid or managed care program needed by the patient, have an available bed and are willing to accept the patient.  Yes   Patient/family informed of [facility]'s ownership interest in [facility] and [facility], as well as of the fact that they are under no obligation to receive care at these facilities.  PASRR submitted to EDS on       PASRR number received on       Existing PASRR number confirmed on       FL2 transmitted to all facilities in geographic area requested by pt/family on       FL2 transmitted to all facilities within larger geographic area on       Patient informed that his/her managed care company has contracts with or will negotiate with certain facilities, including the following:        Yes   Patient/family informed of bed offers received.  Patient chooses bed at [facility]     Physician recommends and patient chooses bed at      Patient to be transferred to [facility] on 06/06/18.  Patient to be transferred to facility by [facility]     Patient family notified on 06/06/18 of transfer.  Name of family member notified:  Diona Browner     PHYSICIAN       Additional Comment:    _______________________________________________ Candie Chroman,  LCSW 06/06/2018, 12:35 PM

## 2018-06-06 NOTE — Telephone Encounter (Signed)
FCopied from [employer] [phone]. Topic: General - Other >> Jun 03, 2018  9:51 AM Leward Quan A wrote: Reason for CRM: Patient daughter [name] called to say that mom is in the hospital since 05/31/18 went to ER and was transported to [facility]. Just wanted to let Dr know that is the reason appointment for 06/06/18 was cancelled.

## 2018-06-06 NOTE — Clinical Social Work Note (Signed)
CSW facilitated patient discharge including contacting patient family and facility to confirm patient discharge plans. Clinical information faxed to facility and family agreeable with plan. CSW arranged ambulance transport via [facility] to [facility]. RN to call report prior to discharge ([phone]).  CSW will sign off for now as social work intervention is no longer needed. Please consult us again if new needs arise.  Amnah Breuer, CSW [phone]  

## 2018-06-06 NOTE — Clinical Social Work Note (Addendum)
Patient has a bed at [facility] when stable. Sent MD a message to notify.  Dayton Scrape, [place] [phone]  10:29 am Patient and daughter aware of plan to discharge to [facility] today. Daughter unable to drive today to visit/complete paperwork at SNF due to medication she has been put on. Patient stated she will need [facility] due to weakness.  Dayton Scrape, [place]

## 2018-06-06 NOTE — Plan of Care (Signed)
°  Problem: Clinical Measurements: °Goal: Respiratory complications will improve °Outcome: Progressing °Goal: Cardiovascular complication will be avoided °Outcome: Progressing °  °Problem: Coping: °Goal: Level of anxiety will decrease °Outcome: Progressing °  °Problem: Pain Managment: °Goal: General experience of comfort will improve °Outcome: Progressing °  °Problem: Safety: °Goal: Ability to remain free from injury will improve °Outcome: Progressing °  °

## 2018-06-06 NOTE — Telephone Encounter (Signed)
Pt to be d/c home today [facility], Please call her tomorrow and arrange a TCM

## 2018-06-06 NOTE — Progress Notes (Signed)
ANTICOAGULATION CONSULT NOTE - Consult  Pharmacy Consult for Coumadin Indications: DVT, afib  No Known Allergies  Patient Measurements: Height: 5\' 6"  (167.6 cm) Weight: 195 lb 6.4 oz (88.6 kg) IBW/kg (Calculated) : 59.3   Vital Signs: Temp: 97.8 F (36.6 C) (12/02 0500) Temp Source: Oral (12/02 0500) BP: 139/68 (12/02 0500) Pulse Rate: 68 (12/02 0500)  Labs: Recent Labs    06/04/18 0431 06/04/18 0731 06/04/18 1428 06/04/18 1818 06/05/18 0030 06/05/18 0542 06/06/18 0518  HGB  --   --   --   --   --  11.3*  --   HCT  --   --   --   --   --  36.4  --   PLT  --   --   --   --   --  56*  --   LABPROT 20.9*  --   --   --   --  22.3* 21.4*  INR 1.83  --   --   --   --  1.99 1.88  CREATININE 1.47*  --   --   --   --  1.42*  --   CKTOTAL  --  154  --   --   --   --   --   TROPONINI  --   --  <0.03 <0.03 <0.03  --   --    Estimated Creatinine Clearance: 30.1 mL/min (A) (by C-G formula based on SCr of 1.42 mg/dL (H)).  Medical History: Past Medical History:  Diagnosis Date  . Acute encephalopathy   . Acute renal failure superimposed on stage 3 chronic kidney disease ([place])   . Anemia   . Atrial fibrillation ([place])   . Colon cancer ([place]) 12/11  . Colon cancer ([place]) 2011  . Diarrhea   . DVT (deep venous thrombosis) ([place]) 11/06  . Elevated brain natriuretic peptide (BNP) level   . Frequent headaches   . Heart murmur   . Hyperkalemia   . Hypertension   . Pulmonary embolism ([place]) 11/06  . Seizures ([place])   . Stroke [facility]) 11/30/2014   Assessment: 84 yo F s/p fall with h/o afib, DVT/PE and stroke who is on anticoagulation with warfarin.  Her admitting INR was 2.3.   HOME REGIMEN:  Warfarin 2 mg daily (last PTA dose taken 11/26 per med rec)  INR subtherapeutic at 1.88, may still be seeing effects of missed dose 11/27.  Hgb 11.3, plts 56, no bleeding.  Goal of Therapy:  INR 2-3 Monitor platelets by anticoagulation protocol: Yes   Plan:  Warfarin 3mg  PO x 1  tonight Daily INR, s/s bleeding  Bertis Ruddy, PharmD Clinical Pharmacist Please check AMION for all [place] numbers 06/06/2018 9:20 AM

## 2018-06-06 NOTE — Progress Notes (Signed)
Report called to nurse at [facility].  Pt given info as well.  All questions answered. Pt will be transported by [facility] with all belongings.

## 2018-06-06 NOTE — Discharge Summary (Signed)
Physician Discharge Summary  [name]:[id number] DOB: Mar 31, 1927 DOA: 06/01/2018  PCP: Colon Branch, MD  Admit date: 06/01/2018 Discharge date: 06/06/2018  Admitted From: Home Disposition: SNF  Recommendations for Outpatient Follow-up:  1. Follow up with PCP in 1 week 2. Check INR 3. Please obtain BMP/CBC in one week 4. Please follow up on the following pending results: None  Discharge Condition: Stable CODE STATUS: Full code Diet recommendation: Heart healthy and low-sodium  HPI: Per Dr. Jaquelyn Bitter is a 84 y.o. female with medical history significant of hypertension, hyperlipidemia, stroke, depression, CKD 3, atrial fibrillation on Coumadin, DVT/PE on Coumadin, thrombocytopenia, who presents with fall.  Patient states that she has generalized weakness recently, and had 3 falls since Sunday.  Patient denies facial droop or slurred speech. She denies unilateral, weakness, numbness or tingling in extremities, however when I saw patient on the floor, she could not move her right leg.  She states that she has pain in upper back between shoulder blades and pain in right leg shin area. She cannot specify whether she has weakness in right leg or pain making her unable to move her right leg.  She had some chest wall pain earlier after fall, which has resolved currently.  No shortness of breath, cough, fever or chills.  Patient denies nausea, vomiting, diarrhea or abdominal pain.  She states that she has intermittent dysuria and burning or urination recently.  Of note, patient has ongoing visual hallucination, seeing man on the wall. She is following with neurologist for hallucination.  ED Course: pt was found to have urinalysis (clear appearance, trace amount of leukocyte, many bacteria, WBC 0-5, positive nitrite), INR 2.30, negative troponin, WBC 7.3, worsening renal function, mild abnormal liver function (ALP 39, AST 64, ALT 23, total bilirubin 2.0), temperature normal,  tachycardia, no tachypnea, oxygen saturation 96% on room air, chest x-ray negative for infiltration, showed mild cardiomegaly.  CT of head and C-spine negative for acute issues.  Patient is placed on telemetry bed for observation.  Hospital Course: 84 year old female with a history of hypertension, stroke, CKD, atrial fibrillation on Coumadin, history of DVT and PE came to the hospital after a fall. Had multiple imaging including CT head and cervical spine, thoracic spine film, chest x-ray, right-sided pelvic x-ray and right tibia/fibula x-ray which are negative for acute osseous finding.  She had mildly elevated creatinine kinase to 557 that has resolved.  Noted to have right-sided leg weakness when evaluated by physical therapy.  MRI brain and right hip and pelvic x-ray ordered but patient declined.  Right-sided leg weakness also improved.  Of note, patient has prior history of CVA.  Patient was evaluated by physical therapy who recommended SNF placement.   Patient also was found to have abnormal UA and started on IV ceftriaxone. Urine culture growing pansensitive E. Coli. Transition from ceftriaxone to Amoxil and completed the required antibiotic course.  Please see individual problem list below for more.  Discharge Diagnoses:  Principal Problem:   Fall Active Problems:   Essential hypertension   Hx pulmonary embolism   Warfarin anticoagulation   Dyslipidemia   History of CVA (cerebrovascular accident)   UTI (urinary tract infection)   Thrombocytopenia (HCC)   Acute renal failure superimposed on stage 3 chronic kidney disease (HCC)   Chronic atrial fibrillation   Abnormal LFTs   Hallucination   Right leg weakness   Generalized weakness  Chest pain: Resolved.  EKG with some ST depression in lateral  precordial leads 11/29  Serial troponin remained negative.  This was discussed with cardiology (Dr. Sherryle Lis) over the phone who recommended iodine indoor at 15 mg daily. -Continue  metoprolol, Imdur, atorvastatin, warfarin and as needed nitroglycerin.  Diastolic CHF/pulmonary hypertension: Echo in 11/2017 with EF of 65 to 70%, moderate LVH, severely dilated LA and RA, moderate TV regurg and PAPP of 90 mmhg. has some wheezing and dependent edema today. -Continue home Lasix -Monitor daily weight, intake output and renal function.  Chronic atrial fibrillation: Not in RVR.  Chads vascular score 6.  INR subtherapeutic -INR check in 2 to 3 days for discharge. -Metoprolol and verapamil for rate control  Mechanical fall: Multiple imaging including CT head and cervical spine, thoracic spine film, chest x-ray, right-sided pelvic x-ray, and right tibia/fibula film negative for acute osseous finding.    Mildly elevated CK resolved. -Evaluated by physical therapy who recommended SNF.   Elevated liver enzymes.  Pattern consistent with elevated CK.  Resolved.  UTI: Urinalysis concerning.  Urine culture grew pansensitive E. Coli. -Ceftriaxone 05/2016 to 11/28 -Amoxicillin 11/30-12/2  AKI on CKD 3: Improved.  Baseline 1-1.3.  Likely due to mild rhabdomyolysis.  -Repeat BMP in 1 to 2 weeks  Hyponatremia: Resolved.  Hypertension: Normotensive. -Cardiac meds as above  History of PE/DVT: INR subtherapeutic. -INR check in 2 to 3 days and adjust warfarin as appropriate  ?Right leg weakness/history of stroke: Appears resolved.  Symmetric strength in both legs on my exam today.  No pain with leg movements either.  Continues to refuse MRI and pelvic/hip x-ray. -Continue home statin.  -Discontinued aspirin given thrombocytopenia and risk for bleeding.  Patient is also on warfarin. -Continue physical therapy  Chronic thrombocytopenia: Unknown etiology.  No history of ITP or liver disease in the chart.  Patient is on Coumadin. -Continue monitoring  Discharge Instructions  Discharge Instructions    Diet - low sodium heart healthy   Complete by:  As directed    Discharge  instructions   Complete by:  As directed    Have INR checked in 2 to 3 days after discharge. Follow-up with PCP in 1 to 2 weeks. Check BMP and CBC at follow-up.   Increase activity slowly   Complete by:  As directed      Allergies as of 06/06/2018   No Known Allergies     Medication List    STOP taking these medications   aspirin EC 81 MG tablet   CALCIUM 1200 PO Replaced by:  calcium-vitamin D 500-200 MG-UNIT tablet   verapamil 120 MG tablet Commonly known as:  CALAN Replaced by:  verapamil 120 MG CR tablet     TAKE these medications   acetaminophen 500 MG tablet Commonly known as:  TYLENOL Take 1,000 mg by mouth every 8 (eight) hours as needed for headache (pain).   ADVAIR DISKUS 100-50 MCG/DOSE Aepb Generic drug:  Fluticasone-Salmeterol Inhale 1 puff into the lungs 2 (two) times daily.   albuterol (2.5 MG/3ML) 0.083% nebulizer solution Commonly known as:  PROVENTIL Take 3 mLs (2.5 mg total) by nebulization 3 (three) times daily as needed for wheezing or shortness of breath.   PROAIR HFA 108 (90 Base) MCG/ACT inhaler Generic drug:  albuterol Inhale 2 puffs into the lungs every 6 (six) hours as needed for wheezing or shortness of breath.   atorvastatin 20 MG tablet Commonly known as:  LIPITOR Take 1 tablet (20 mg total) by mouth daily. What changed:  when to take this   CALCIUM  600-D PO Take 1 tablet by mouth daily.   calcium-vitamin D 500-200 MG-UNIT tablet Commonly known as:  OSCAL WITH D Take 1 tablet by mouth daily with breakfast. Start taking on:  06/07/2018 Replaces:  CALCIUM 1200 PO   ENSURE Take 237 mLs by mouth daily with breakfast. Milk chocolate   furosemide 40 MG tablet Commonly known as:  LASIX Take 1 tablet (40 mg total) by mouth daily.   isosorbide mononitrate 30 MG 24 hr tablet Commonly known as:  IMDUR Take 0.5 tablets (15 mg total) by mouth daily.   loratadine 10 MG tablet Commonly known as:  CLARITIN Take 10 mg by mouth daily as  needed (seasonal allergies).   metoprolol tartrate 25 MG tablet Commonly known as:  LOPRESSOR Take 1.5 tablets (37.5 mg total) by mouth 2 (two) times daily.   multivitamin with minerals Tabs tablet Take 1 tablet by mouth daily. Centrum Silver What changed:  Another medication with the same name was changed. Make sure you understand how and when to take each.   multivitamin with minerals Tabs tablet Take 1 tablet by mouth daily. What changed:  how much to take   nitroGLYCERIN 0.4 MG SL tablet Commonly known as:  NITROSTAT Place 1 tablet (0.4 mg total) under the tongue every 5 (five) minutes as needed for chest pain.   promethazine 12.5 MG tablet Commonly known as:  PHENERGAN Take 1 tablet (12.5 mg total) by mouth 3 (three) times daily as needed for nausea or vomiting.   QUEtiapine 25 MG tablet Commonly known as:  SEROQUEL Take 1-2 tablets (25-50 mg total) by mouth at bedtime.   traMADol 50 MG tablet Commonly known as:  ULTRAM Take 1 tablet (50 mg total) by mouth every 6 (six) hours as needed for moderate pain.   verapamil 120 MG CR tablet Commonly known as:  CALAN-SR Take 1 tablet (120 mg total) by mouth at bedtime. Replaces:  verapamil 120 MG tablet   warfarin 2.5 MG tablet Commonly known as:  COUMADIN Take as directed. If you are unsure how to take this medication, talk to your nurse or doctor. Original instructions:  USE AS DIRECTED BY COUMADIN CLINIC What changed:  Another medication with the same name was changed. Make sure you understand how and when to take each.   warfarin 2 MG tablet Commonly known as:  COUMADIN Take as directed. If you are unsure how to take this medication, talk to your nurse or doctor. Original instructions:  Take 1 tablet (2 mg total) by mouth daily. What changed:  when to take this       Contact information for follow-up providers    Colon Branch, MD Follow up in 1 week(s).   Specialty:  Internal Medicine Contact information: [address] [place] [address] [phone]            Contact information for after-discharge care    Destination    [facility] Preferred SNF .   Service:  Skilled Nursing Contact information: [address] [place] [phone]                  Consultations:  Cardiology over the phone  Procedures/Studies:  None  Dg Chest 2 View  Result Date: 06/01/2018 CLINICAL DATA:  Multiple falls EXAM: CHEST - 2 VIEW COMPARISON:  08/16/2017 FINDINGS: Mild cardiomegaly. No acute opacity or pleural effusion. No pneumothorax. Degenerative changes of the spine. IMPRESSION: No active cardiopulmonary  disease.  Mild cardiomegaly. Electronically Signed   By: Donavan Foil M.D.   On: 06/01/2018 18:31   Dg Thoracic Spine 2 View  Result Date: 06/02/2018 CLINICAL DATA:  Multiple falls over the past few days. Confusion. EXAM: THORACIC SPINE 2 VIEWS COMPARISON:  Plain film of the abdomen dated 06/20/2010. Chest x-ray dated 06/01/2018. CT abdomen dated 08/06/2014. FINDINGS: Stable scoliosis of the thoracolumbar spine. No acute or suspicious osseous finding, although characterization is significantly limited by the lack of a lateral view (lateral view decline by the patient). Visualized paravertebral soft tissues are unremarkable. IVC filter in place. IMPRESSION: Study significantly limited by the lack of a lateral view (declined by the patient). Stable scoliosis. Electronically Signed   By: Franki Cabot M.D.   On: 06/02/2018 08:01   Dg Tibia/fibula Right  Result Date: 06/02/2018 CLINICAL DATA:  Fall yesterday with lower leg pain, initial encounter EXAM: RIGHT TIBIA AND FIBULA - 2 VIEW COMPARISON:  None. FINDINGS: Degenerative changes are noted about the knee joint. No acute fracture or dislocation is seen. No gross soft tissue abnormality is noted. IMPRESSION: Chronic changes without acute abnormality. Electronically Signed   By:  Inez Catalina M.D.   On: 06/02/2018 14:25   Ct Head Wo Contrast  Result Date: 06/01/2018 CLINICAL DATA:  Multiple recent falls. Reported head injury. History of CVA x2 and colon cancer. EXAM: CT HEAD WITHOUT CONTRAST CT CERVICAL SPINE WITHOUT CONTRAST TECHNIQUE: Multidetector CT imaging of the head and cervical spine was performed following the standard protocol without intravenous contrast. Multiplanar CT image reconstructions of the cervical spine were also generated. COMPARISON:  09/16/2015 head CT. FINDINGS: CT HEAD FINDINGS Brain: No evidence of parenchymal hemorrhage or extra-axial fluid collection. Stable posterior fossa cyst asymmetric to the left along posterior cerebellum compatible with arachnoid cyst. No new mass lesion, mass effect, or midline shift. No CT evidence of acute infarction. Generalized cerebral volume loss. Nonspecific moderate subcortical and periventricular white matter hypodensity, most in keeping with chronic small vessel ischemic change. No ventriculomegaly. Vascular: No acute abnormality. Skull: No evidence of calvarial fracture. Sinuses/Orbits: The visualized paranasal sinuses are essentially clear. Other:  The mastoid air cells are unopacified. CT CERVICAL SPINE FINDINGS Alignment: Exaggerated thoracic kyphosis with normal cervical lordosis. No facet subluxation. Dens is well positioned between the lateral masses of C1. Mild 3 mm anterolisthesis C4-5 and C5-6. Skull base and vertebrae: No acute fracture. No primary bone lesion or focal pathologic process. Soft tissues and spinal canal: No prevertebral edema. No visible canal hematoma. Disc levels: Moderate to severe multilevel degenerative disc disease in the cervical spine, most prominent at C6-7. Marked bilateral facet arthropathy. Mild degenerative foraminal stenosis bilaterally at C3-4 and C4-5. Upper chest: No acute abnormality. Tiny calcified posterior right upper lobe granuloma. Other: Visualized mastoid air cells appear  clear. No discrete thyroid nodules. No pathologically enlarged cervical nodes. IMPRESSION: CT HEAD: 1. No evidence of acute intracranial abnormality. No evidence of calvarial fracture. 2. Generalized cerebral volume loss and moderate chronic small vessel ischemic changes in the cerebral white matter. CT CERVICAL SPINE: 1. No cervical spine fracture or facet subluxation. 2. Moderate to severe multilevel cervical degenerative changes as detailed. Electronically Signed   By: Ilona Sorrel M.D.   On: 06/01/2018 18:37   Ct Cervical Spine Wo Contrast  Result Date: 06/01/2018 CLINICAL DATA:  Multiple recent falls. Reported head injury. History of CVA x2 and colon cancer. EXAM: CT HEAD WITHOUT CONTRAST CT CERVICAL SPINE WITHOUT CONTRAST TECHNIQUE: Multidetector CT imaging of  the head and cervical spine was performed following the standard protocol without intravenous contrast. Multiplanar CT image reconstructions of the cervical spine were also generated. COMPARISON:  09/16/2015 head CT. FINDINGS: CT HEAD FINDINGS Brain: No evidence of parenchymal hemorrhage or extra-axial fluid collection. Stable posterior fossa cyst asymmetric to the left along posterior cerebellum compatible with arachnoid cyst. No new mass lesion, mass effect, or midline shift. No CT evidence of acute infarction. Generalized cerebral volume loss. Nonspecific moderate subcortical and periventricular white matter hypodensity, most in keeping with chronic small vessel ischemic change. No ventriculomegaly. Vascular: No acute abnormality. Skull: No evidence of calvarial fracture. Sinuses/Orbits: The visualized paranasal sinuses are essentially clear. Other:  The mastoid air cells are unopacified. CT CERVICAL SPINE FINDINGS Alignment: Exaggerated thoracic kyphosis with normal cervical lordosis. No facet subluxation. Dens is well positioned between the lateral masses of C1. Mild 3 mm anterolisthesis C4-5 and C5-6. Skull base and vertebrae: No acute  fracture. No primary bone lesion or focal pathologic process. Soft tissues and spinal canal: No prevertebral edema. No visible canal hematoma. Disc levels: Moderate to severe multilevel degenerative disc disease in the cervical spine, most prominent at C6-7. Marked bilateral facet arthropathy. Mild degenerative foraminal stenosis bilaterally at C3-4 and C4-5. Upper chest: No acute abnormality. Tiny calcified posterior right upper lobe granuloma. Other: Visualized mastoid air cells appear clear. No discrete thyroid nodules. No pathologically enlarged cervical nodes. IMPRESSION: CT HEAD: 1. No evidence of acute intracranial abnormality. No evidence of calvarial fracture. 2. Generalized cerebral volume loss and moderate chronic small vessel ischemic changes in the cerebral white matter. CT CERVICAL SPINE: 1. No cervical spine fracture or facet subluxation. 2. Moderate to severe multilevel cervical degenerative changes as detailed. Electronically Signed   By: Ilona Sorrel M.D.   On: 06/01/2018 18:37   Dg Hip Unilat With Pelvis 1v Right  Result Date: 06/02/2018 CLINICAL DATA:  Multiple falls over the past few days. Confusion. EXAM: DG HIP (WITH OR WITHOUT PELVIS) 1V RIGHT COMPARISON:  Plain film of the abdomen dated 06/20/2010. FINDINGS: Single-view of the pelvis and single view of the RIGHT hip are provided. LEFT hip arthroplasty hardware appears intact and stable in position. No acute osseous fracture or dislocation seen. Soft tissues about the pelvis and RIGHT hip are unremarkable. IMPRESSION: No acute findings. Electronically Signed   By: Franki Cabot M.D.   On: 06/02/2018 08:03     Subjective: No major events overnight.  No complaints.  Reports having a good night.  Denies chest pain, shortness of breath, abdominal pain, nausea, vomiting or dysuria.  Hoping to go to SNF close to her house.  Discharge Exam: Vitals:   06/06/18 0500 06/06/18 0756  BP: 139/68   Pulse: 68   Resp: 20   Temp: 97.8 F  (36.6 C)   SpO2: 97% 96%   GENERAL: Appears well. No acute distress.  HEENT: MMM.  Vision and Hearing grossly intact.  NECK: Supple.  No JVD.  LUNGS:  No IWOB.  Fair air movement bilaterally.  CTAB.  HEART: Irregular rhythm.  Normal rate.  Heart sounds normal.  ABD: Bowel sounds present. Soft. Non tender.  EXT:   no edema bilaterally.  SKIN: no apparent skin lesion.  NEURO: Awake, alert and oriented appropriately.  No gross deficit.  PSYCH: Calm. Normal affect.   The results of significant diagnostics from this hospitalization (including imaging, microbiology, ancillary and laboratory) are listed below for reference.     Microbiology: Recent Results (from the past 240 hour(s))  Urine culture     Status: Abnormal   Collection Time: 06/01/18  5:26 PM  Result Value Ref Range Status   Specimen Description   Final    URINE, CATHETERIZED Performed at [facility], [address] [place], [place] [address]    Special Requests   Final    NONE Performed at [facility], [address] [place], [place] [address]    Culture >=100,000 COLONIES/mL ESCHERICHIA COLI (A)  Final   Report Status 06/04/2018 FINAL  Final   Organism ID, Bacteria ESCHERICHIA COLI (A)  Final      Susceptibility   Escherichia coli - MIC*    AMPICILLIN 4 SENSITIVE Sensitive     CEFAZOLIN <=4 SENSITIVE Sensitive     CEFTRIAXONE <=1 SENSITIVE Sensitive     CIPROFLOXACIN <=0.25 SENSITIVE Sensitive     GENTAMICIN <=1 SENSITIVE Sensitive     IMIPENEM <=0.25 SENSITIVE Sensitive     NITROFURANTOIN <=16 SENSITIVE Sensitive     TRIMETH/SULFA <=20 SENSITIVE Sensitive     AMPICILLIN/SULBACTAM <=2 SENSITIVE Sensitive     PIP/TAZO <=4 SENSITIVE Sensitive     Extended ESBL NEGATIVE Sensitive     * >=100,000 COLONIES/mL ESCHERICHIA COLI     Labs: BNP (last 3 results) No results for input(s): BNP in the last 8760 hours. Basic Metabolic Panel: Recent Labs  Lab 06/01/18 1726 06/02/18 0223  06/03/18 0453 06/04/18 0431 06/05/18 0542  NA 143 146* 144 144 145  K 4.0 3.9 4.1 4.1 4.4  CL 109 114* 113* 108 112*  CO2 25 24 23 22 24   GLUCOSE 110* 102* 85 94 90  BUN 60* 50* 43* 34* 29*  CREATININE 2.12* 1.87* 1.70* 1.47* 1.42*  CALCIUM 9.6 8.9 9.2 9.0 8.8*   Liver Function Tests: Recent Labs  Lab 06/01/18 1726 06/04/18 0731  AST 64* 41  ALT 23 21  ALKPHOS 39 31*  BILITOT 2.0* 1.2  PROT 6.7 5.7*  ALBUMIN 4.5 3.6   No results for input(s): LIPASE, AMYLASE in the last 168 hours. No results for input(s): AMMONIA in the last 168 hours. CBC: Recent Labs  Lab 06/01/18 1726 06/02/18 0223 06/05/18 0542  WBC 7.3 5.9 3.8*  NEUTROABS 5.7  --   --   HGB 13.8 12.3 11.3*  HCT 43.5 38.5 36.4  MCV 98.0 96.5 98.1  PLT 63* 58* 56*   Cardiac Enzymes: Recent Labs  Lab 06/01/18 1726 06/02/18 0223 06/04/18 0731 06/04/18 1428 06/04/18 1818 06/05/18 0030  CKTOTAL  --  557* 154  --   --   --   TROPONINI <0.03  --   --  <0.03 <0.03 <0.03   BNP: Invalid input(s): POCBNP CBG: Recent Labs  Lab 06/03/18 2109 06/04/18 0757 06/04/18 1144  GLUCAP 122* 77 99   D-Dimer No results for input(s): DDIMER in the last 72 hours. Hgb A1c No results for input(s): HGBA1C in the last 72 hours. Lipid Profile No results for input(s): CHOL, HDL, LDLCALC, TRIG, CHOLHDL, LDLDIRECT in the last 72 hours. Thyroid function studies No results for input(s): TSH, T4TOTAL, T3FREE, THYROIDAB in the last 72 hours.  Invalid input(s): FREET3 Anemia work up No results for input(s): VITAMINB12, FOLATE, FERRITIN, TIBC, IRON, RETICCTPCT in the last 72 hours. Urinalysis    Component Value Date/Time   COLORURINE YELLOW 06/01/2018 1726   APPEARANCEUR CLEAR 06/01/2018 1726   APPEARANCEUR Cloudy (A) 01/13/2018 1619   LABSPEC 1.010 06/01/2018 1726   PHURINE 6.5 06/01/2018 1726   GLUCOSEU  NEGATIVE 06/01/2018 1726   GLUCOSEU NEGATIVE 08/01/2014 1555   HGBUR TRACE (A) 06/01/2018 1726   BILIRUBINUR  NEGATIVE 06/01/2018 1726   BILIRUBINUR Negative 01/13/2018 1619   KETONESUR NEGATIVE 06/01/2018 1726   PROTEINUR NEGATIVE 06/01/2018 1726   UROBILINOGEN 1.0 08/01/2014 1555   NITRITE POSITIVE (A) 06/01/2018 1726   LEUKOCYTESUR TRACE (A) 06/01/2018 1726   LEUKOCYTESUR Trace (A) 01/13/2018 1619   Sepsis Labs Invalid input(s): PROCALCITONIN,  WBC,  LACTICIDVEN   Time coordinating discharge: 35 minutes  SIGNED:  Mercy Riding, MD  [facility] 06/06/2018, 10:32 AM Pager [phone]  If 7PM-7AM, please contact night-coverage [email or web address] Password [email or web address]

## 2018-06-07 ENCOUNTER — Telehealth: Payer: Self-pay

## 2018-06-07 ENCOUNTER — Non-Acute Institutional Stay (SKILLED_NURSING_FACILITY): Payer: Medicare Other | Admitting: Internal Medicine

## 2018-06-07 ENCOUNTER — Encounter: Payer: Self-pay | Admitting: Internal Medicine

## 2018-06-07 DIAGNOSIS — N179 Acute kidney failure, unspecified: Secondary | ICD-10-CM | POA: Diagnosis not present

## 2018-06-07 DIAGNOSIS — N183 Chronic kidney disease, stage 3 (moderate): Secondary | ICD-10-CM

## 2018-06-07 DIAGNOSIS — N390 Urinary tract infection, site not specified: Secondary | ICD-10-CM | POA: Diagnosis not present

## 2018-06-07 DIAGNOSIS — R079 Chest pain, unspecified: Secondary | ICD-10-CM | POA: Diagnosis not present

## 2018-06-07 DIAGNOSIS — I25118 Atherosclerotic heart disease of native coronary artery with other forms of angina pectoris: Secondary | ICD-10-CM

## 2018-06-07 DIAGNOSIS — B9620 Unspecified Escherichia coli [E. coli] as the cause of diseases classified elsewhere: Secondary | ICD-10-CM

## 2018-06-07 DIAGNOSIS — R29898 Other symptoms and signs involving the musculoskeletal system: Secondary | ICD-10-CM

## 2018-06-07 DIAGNOSIS — I482 Chronic atrial fibrillation, unspecified: Secondary | ICD-10-CM | POA: Diagnosis not present

## 2018-06-07 DIAGNOSIS — I270 Primary pulmonary hypertension: Secondary | ICD-10-CM | POA: Insufficient documentation

## 2018-06-07 DIAGNOSIS — I2510 Atherosclerotic heart disease of native coronary artery without angina pectoris: Secondary | ICD-10-CM | POA: Insufficient documentation

## 2018-06-07 DIAGNOSIS — I5032 Chronic diastolic (congestive) heart failure: Secondary | ICD-10-CM | POA: Diagnosis not present

## 2018-06-07 NOTE — Progress Notes (Addendum)
:  Location:  [facility] Room Number: 103P Place of Service:  SNF (31)  [name] Coil, MD  [name] Care Team: Colon Branch, MD as PCP - General (Internal Medicine)  Extended Emergency Contact Information Primary Emergency Contact: [facility] Address: [address]          [place], [place] [address] [name] Phone: [phone] Mobile Phone: [phone] Relation: Daughter     Allergies: [name] has no known allergies.  Chief Complaint  [name] presents with  . New Admit To SNF    Admit to [facility]    HPI: [name] is 84 y.o. female with hypertension, hyperlipidemia, stroke, depression, CKD 3, atrial femoral Coumadin, DVT/PE on Coumadin, thrombocytopenia, who presented to [facility] with a fall.  [name] stated she had generalized weakness recently and had 3 falls since Sunday.  [name] denied facial droop or slurred speech or unilateral weakness numbness or tingling.  However [name] was noted not to be able to move her right leg.  [name] stated she had pain in her upper back between her shoulder blades and pain in her right shin area she could not specify whether she had weakness in the right leg or pain making her unable to move her right leg [name] had some chest pain earlier after fall but it had resolved.  No shortness of breath cough fever chills, no nausea vomiting diarrhea or abdominal pain.  In the ED [name] was found to have a not particularly positive urine, INR 2.3, negative troponin, WBC 7.3, mild worsening of renal function, mild abnormal liver functions, temperature normal tachycardia, no tachypnea O2 sat 96 on room air chest x-ray negative for infiltrate and CT of the head negative for acute.  [name] was admitted to hospital from 11/27-12/2 for observation.  Ultimately as of the [name] was at baseline for everything except for right leg weakness which resolved during hospital stay.  [name] also  had a UTI E. coli pansensitive treated with Rocephin and amoxicillin and acute kidney injury on chronic kidney disease which improved probably due to rhabdov.  [name] is admitted to skilled nursing facility for OT/PT while at skilled nursing facility [name] will be followed for chronic atrial fibrillation treated with metoprolol verapamil and Coumadin, diastolic congestive heart failure treated with metoprolol and Lasix and pulmonary hypertension treated with Lasix  Past Medical History:  Diagnosis Date  . Acute encephalopathy   . Acute renal failure superimposed on stage 3 chronic kidney disease ([place])   . Anemia   . Atrial fibrillation ([place])   . Colon cancer ([place]) 12/11  . Colon cancer ([place]) 2011  . Diarrhea   . DVT (deep venous thrombosis) ([place]) 11/06  . Elevated brain natriuretic peptide (BNP) level   . Frequent headaches   . Heart murmur   . Hyperkalemia   . Hypertension   . Pulmonary embolism ([place]) 11/06  . Seizures ([place])   . Stroke [facility]) 11/30/2014    Past Surgical History:  Procedure Laterality Date  . APPENDECTOMY  1952  . COLECTOMY  06/2010   partial, Dr. Donne Hazel complicated by LLE CVT; S/P IVC umbrella & anemia  . HIP FRACTURE SURGERY  2006   Trauma  . TOTAL ABDOMINAL HYSTERECTOMY W/ BILATERAL SALPINGOOPHORECTOMY  1973   For Fibroids  . TOTAL HIP ARTHROPLASTY  01/03/07   Left hip replacement.  Marland Kitchen VENA CAVA FILTER PLACEMENT  06/28/2010    Allergies as of 06/07/2018   No Known Allergies  Medication List        Accurate as of 06/07/18 12:45 PM. Always use your most recent med list.          acetaminophen 500 MG tablet Commonly known as:  TYLENOL Take 1,000 mg by mouth every 8 (eight) hours as needed for headache (pain).   ADVAIR DISKUS 100-50 MCG/DOSE Aepb Generic drug:  Fluticasone-Salmeterol Inhale 1 puff into the lungs 2 (two) times daily.   albuterol (2.5 MG/3ML) 0.083% nebulizer solution Commonly known as:  PROVENTIL Take 3 mLs (2.5 mg total) by  nebulization 3 (three) times daily as needed for wheezing or shortness of breath.   PROAIR HFA 108 (90 Base) MCG/ACT inhaler Generic drug:  albuterol Inhale 2 puffs into the lungs every 6 (six) hours as needed for wheezing or shortness of breath.   atorvastatin 20 MG tablet Commonly known as:  LIPITOR Take 1 tablet (20 mg total) by mouth daily.   CALCIUM 600-D PO Take 1 tablet by mouth daily.   calcium-vitamin D 500-200 MG-UNIT tablet Commonly known as:  OSCAL WITH D Take 1 tablet by mouth daily with breakfast.   ENSURE Take 237 mLs by mouth daily with breakfast. Milk chocolate   furosemide 40 MG tablet Commonly known as:  LASIX Take 1 tablet (40 mg total) by mouth daily.   isosorbide mononitrate 30 MG 24 hr tablet Commonly known as:  IMDUR Take 0.5 tablets (15 mg total) by mouth daily.   loratadine 10 MG tablet Commonly known as:  CLARITIN Take 10 mg by mouth daily as needed (seasonal allergies).   metoprolol tartrate 25 MG tablet Commonly known as:  LOPRESSOR Take 1.5 tablets (37.5 mg total) by mouth 2 (two) times daily.   multivitamin with minerals Tabs tablet Take 1 tablet by mouth daily. Centrum Silver   multivitamin with minerals Tabs tablet Take 1 tablet by mouth daily.   nitroGLYCERIN 0.4 MG SL tablet Commonly known as:  NITROSTAT Place 1 tablet (0.4 mg total) under the tongue every 5 (five) minutes as needed for chest pain.   promethazine 12.5 MG tablet Commonly known as:  PHENERGAN Take 1 tablet (12.5 mg total) by mouth 3 (three) times daily as needed for nausea or vomiting.   QUEtiapine 25 MG tablet Commonly known as:  SEROQUEL Take 1-2 tablets (25-50 mg total) by mouth at bedtime.   traMADol 50 MG tablet Commonly known as:  ULTRAM Take 1 tablet (50 mg total) by mouth every 6 (six) hours as needed for moderate pain.   verapamil 120 MG CR tablet Commonly known as:  CALAN-SR Take 1 tablet (120 mg total) by mouth at bedtime.   warfarin 2.5 MG  tablet Commonly known as:  COUMADIN Take as directed by the anticoagulation clinic. If you are unsure how to take this medication, talk to your nurse or doctor. Original instructions:  USE AS DIRECTED BY COUMADIN CLINIC   warfarin 2 MG tablet Commonly known as:  COUMADIN Take as directed by the anticoagulation clinic. If you are unsure how to take this medication, talk to your nurse or doctor. Original instructions:  Take 1 tablet (2 mg total) by mouth daily.       No orders of the defined types were placed in this encounter.   Immunization History  Administered Date(s) Administered  . Influenza Split 03/30/2012  . Influenza Whole 04/30/2006, 04/20/2008, 04/15/2009  . Influenza, High Dose Seasonal PF 04/20/2013, 03/27/2015, 03/31/2016, 04/22/2017, 04/20/2018  . Influenza,inj,Quad PF,6+ Mos 05/23/2014  . Pneumococcal Conjugate-13 04/24/2015  .  Pneumococcal Polysaccharide-23 07/06/2009  . Td 10/30/2016    Social History   Tobacco Use  . Smoking status: Passive Smoke Exposure - Never Smoker  . Smokeless tobacco: Never Used  . Tobacco comment: husband smoked in home.   Substance Use Topics  . Alcohol use: No    Alcohol/week: 0.0 standard drinks    Family history is   Family History  Problem Relation Age of Onset  . Peripheral vascular disease Father   . Heart attack Mother 47  . Heart disease Mother        MI  . Coronary artery disease Sister   . Diabetes Paternal Grandmother   . Coronary artery disease Maternal Grandfather       Review of Systems  DATA OBTAINED: from [name], nurse GENERAL:  no fevers, fatigue, appetite changes SKIN: No itching, or rash EYES: No eye pain, redness, discharge EARS: No earache, tinnitus, change in hearing NOSE: No congestion, drainage or bleeding  MOUTH/THROAT: No mouth or tooth pain, No sore throat RESPIRATORY: No cough, wheezing, SOB CARDIAC: No chest pain, palpitations, lower extremity edema  GI: No abdominal pain, No N/V/D  or constipation, No heartburn or reflux  GU: No dysuria, frequency or urgency, or incontinence  MUSCULOSKELETAL: No unrelieved bone/joint pain NEUROLOGIC: No headache, dizziness or focal weakness PSYCHIATRIC: No c/o anxiety or sadness   Vitals:   06/07/18 1243  BP: 140/65  Pulse: 68  Resp: 18  Temp: (!) 97 F (36.1 C)    SpO2 Readings from Last 1 Encounters:  06/06/18 96%   Body mass index is 33.76 kg/m.     Physical Exam  GENERAL APPEARANCE: Alert, conversant,  No acute distress.  SKIN: No diaphoresis rash HEAD: Normocephalic, atraumatic  EYES: Conjunctiva/lids clear. Pupils round, reactive. EOMs intact.  EARS: External exam WNL, canals clear. Hearing grossly normal.  NOSE: No deformity or discharge.  MOUTH/THROAT: Lips w/o lesions  RESPIRATORY: Breathing is even, unlabored. Lung sounds are clear   CARDIOVASCULAR: Heart regular no murmurs, rubs or gallops.  Trace peripheral edema.   GASTROINTESTINAL: Abdomen is soft, non-tender, not distended w/ normal bowel sounds. GENITOURINARY: Bladder non tender, not distended  MUSCULOSKELETAL: No abnormal joints or musculature NEUROLOGIC:  Cranial nerves 2-12 grossly intact. Moves all extremities  PSYCHIATRIC: Mood and affect appropriate to situation, no behavioral issues  [name] Active Problem List   Diagnosis Date Noted  . Right leg weakness 06/02/2018  . Generalized weakness   . Fall 06/01/2018  . Abnormal LFTs 06/01/2018  . Hallucination 06/01/2018  . Abnormal chest x-ray 09/28/2015  . Edema of right lower extremity 09/28/2015  . Chronic atrial fibrillation 09/28/2015  . Elevated brain natriuretic peptide (BNP) level 09/17/2015  . Acute renal failure superimposed on stage 3 chronic kidney disease ([place]) 09/17/2015  . Seizures ([place]) 09/16/2015  . Epistaxis 09/07/2015  . Asthma 08/14/2015  . PCP NOTES >>>>>>>>>>>>>>>>>>>>>>>>>>>>>> 04/24/2015  . TIA (transient ischemic attack) 12/11/2014  . Dyslipidemia 12/11/2014  .  History of CVA (cerebrovascular accident) 12/01/2014  . UTI (urinary tract infection) 12/01/2014  . Thrombocytopenia ([place]) 12/01/2014  . Chronic renal insufficiency, stage II (mild) 11/12/2013  . Impingement syndrome, shoulder, left 11/09/2013  . Acquired short bowel syndrome 11/09/2013  . Annual physical exam 08/30/2013  . Hx pulmonary embolism 06/27/2013  . Warfarin anticoagulation 06/27/2013  . DDD (degenerative disc disease), lumbar 11/18/2012  . Mitral regurgitation 08/26/2011  . DYSPHAGIA UNSPECIFIED 07/31/2010  . ATRIAL FIBRILLATION   07/08/2010  . COLON CANCER, HX OF 07/08/2010  . DIZZINESS  10/18/2009  . Essential hypertension 12/16/2007  . HIP PAIN, CHRONIC 12/28/2006  . ANEMIA-NOS 07/15/2006  . Personal history of venous thrombosis and embolism 07/15/2006      Labs reviewed: Basic Metabolic Panel:    Component Value Date/Time   NA 145 06/05/2018 0542   NA 146 (H) 01/13/2018 1620   NA 142 06/09/2012 1509   K 4.4 06/05/2018 0542   K 3.9 06/09/2012 1509   CL 112 (H) 06/05/2018 0542   CL 108 (H) 06/09/2012 1509   CO2 24 06/05/2018 0542   CO2 26 06/09/2012 1509   GLUCOSE 90 06/05/2018 0542   GLUCOSE 103 (H) 06/09/2012 1509   BUN 29 (H) 06/05/2018 0542   BUN 46 (H) 01/13/2018 1620   BUN 17.0 06/09/2012 1509   CREATININE 1.42 (H) 06/05/2018 0542   CREATININE 1.09 (H) 10/30/2016 1511   CREATININE 1.2 (H) 06/09/2012 1509   CALCIUM 8.8 (L) 06/05/2018 0542   CALCIUM 9.5 06/09/2012 1509   PROT 5.7 (L) 06/04/2018 0731   PROT 6.9 01/13/2018 1620   PROT 7.2 06/09/2012 1509   ALBUMIN 3.6 06/04/2018 0731   ALBUMIN 4.6 01/13/2018 1620   ALBUMIN 4.2 06/09/2012 1509   AST 41 06/04/2018 0731   AST 17 06/09/2012 1509   ALT 21 06/04/2018 0731   ALT 11 06/09/2012 1509   ALKPHOS 31 (L) 06/04/2018 0731   ALKPHOS 47 06/09/2012 1509   BILITOT 1.2 06/04/2018 0731   BILITOT 1.1 01/13/2018 1620   BILITOT 0.73 06/09/2012 1509   GFRNONAA 33 (L) 06/05/2018 0542   GFRAA 38 (L)  06/05/2018 0542    Recent Labs    06/03/18 0453 06/04/18 0431 06/05/18 0542  NA 144 144 145  K 4.1 4.1 4.4  CL 113* 108 112*  CO2 23 22 24   GLUCOSE 85 94 90  BUN 43* 34* 29*  CREATININE 1.70* 1.47* 1.42*  CALCIUM 9.2 9.0 8.8*   Liver Function Tests: Recent Labs    01/13/18 1620 06/01/18 1726 06/04/18 0731  AST 25 64* 41  ALT 17 23 21   ALKPHOS 49 39 31*  BILITOT 1.1 2.0* 1.2  PROT 6.9 6.7 5.7*  ALBUMIN 4.6 4.5 3.6   No results for input(s): LIPASE, AMYLASE in the last 8760 hours. No results for input(s): AMMONIA in the last 8760 hours. CBC: Recent Labs    08/16/17 1408  06/01/18 1726 06/02/18 0223 06/05/18 0542  WBC 5.3   < > 7.3 5.9 3.8*  NEUTROABS 3,662  --  5.7  --   --   HGB 13.9   < > 13.8 12.3 11.3*  HCT 40.6   < > 43.5 38.5 36.4  MCV 89.0   < > 98.0 96.5 98.1  PLT 92*   < > 63* 58* 56*   < > = values in this interval not displayed.   Lipid Recent Labs    08/16/17 1408  CHOL 97  HDL 37.70*  LDLCALC 48  TRIG 56.0    Cardiac Enzymes: Recent Labs    06/02/18 0223 06/04/18 0731 06/04/18 1428 06/04/18 1818 06/05/18 0030  CKTOTAL 557* 154  --   --   --   TROPONINI  --   --  <0.03 <0.03 <0.03   BNP: No results for input(s): BNP in the last 8760 hours. No results found for: [facility] Lab Results  Component Value Date   HGBA1C 5.6 11/13/2013   Lab Results  Component Value Date   TSH 2.35 11/08/2013   Lab Results  Component Value  Date   VITAMINB12 234 06/13/2010   Lab Results  Component Value Date   FOLATE  06/13/2010    4.9 (NOTE)  Reference Ranges        Deficient:       0.4 - 3.3 ng/mL        Indeterminate:   3.4 - 5.4 ng/mL        Normal:              > 5.4 ng/mL   Lab Results  Component Value Date   IRON 70 10/28/2010   TIBC 413 06/13/2010   FERRITIN 7 (L) 06/13/2010    Imaging and Procedures obtained prior to SNF admission: Dg Chest 2 View  Result Date: 06/01/2018 CLINICAL DATA:  Multiple falls EXAM: CHEST - 2 VIEW  COMPARISON:  08/16/2017 FINDINGS: Mild cardiomegaly. No acute opacity or pleural effusion. No pneumothorax. Degenerative changes of the spine. IMPRESSION: No active cardiopulmonary disease.  Mild cardiomegaly. Electronically Signed   By: Donavan Foil M.D.   On: 06/01/2018 18:31   Dg Thoracic Spine 2 View  Result Date: 06/02/2018 CLINICAL DATA:  Multiple falls over the past few days. Confusion. EXAM: THORACIC SPINE 2 VIEWS COMPARISON:  Plain film of the abdomen dated 06/20/2010. Chest x-ray dated 06/01/2018. CT abdomen dated 08/06/2014. FINDINGS: Stable scoliosis of the thoracolumbar spine. No acute or suspicious osseous finding, although characterization is significantly limited by the lack of a lateral view (lateral view decline by the [name]). Visualized paravertebral soft tissues are unremarkable. IVC filter in place. IMPRESSION: Study significantly limited by the lack of a lateral view (declined by the [name]). Stable scoliosis. Electronically Signed   By: Franki Cabot M.D.   On: 06/02/2018 08:01   Dg Tibia/fibula Right  Result Date: 06/02/2018 CLINICAL DATA:  Fall yesterday with lower leg pain, initial encounter EXAM: RIGHT TIBIA AND FIBULA - 2 VIEW COMPARISON:  None. FINDINGS: Degenerative changes are noted about the knee joint. No acute fracture or dislocation is seen. No gross soft tissue abnormality is noted. IMPRESSION: Chronic changes without acute abnormality. Electronically Signed   By: Inez Catalina M.D.   On: 06/02/2018 14:25   Ct Head Wo Contrast  Result Date: 06/01/2018 CLINICAL DATA:  Multiple recent falls. Reported head injury. History of CVA x2 and colon cancer. EXAM: CT HEAD WITHOUT CONTRAST CT CERVICAL SPINE WITHOUT CONTRAST TECHNIQUE: Multidetector CT imaging of the head and cervical spine was performed following the standard protocol without intravenous contrast. Multiplanar CT image reconstructions of the cervical spine were also generated. COMPARISON:  09/16/2015 head CT.  FINDINGS: CT HEAD FINDINGS Brain: No evidence of parenchymal hemorrhage or extra-axial fluid collection. Stable posterior fossa cyst asymmetric to the left along posterior cerebellum compatible with arachnoid cyst. No new mass lesion, mass effect, or midline shift. No CT evidence of acute infarction. Generalized cerebral volume loss. Nonspecific moderate subcortical and periventricular white matter hypodensity, most in keeping with chronic small vessel ischemic change. No ventriculomegaly. Vascular: No acute abnormality. Skull: No evidence of calvarial fracture. Sinuses/Orbits: The visualized paranasal sinuses are essentially clear. Other:  The mastoid air cells are unopacified. CT CERVICAL SPINE FINDINGS Alignment: Exaggerated thoracic kyphosis with normal cervical lordosis. No facet subluxation. Dens is well positioned between the lateral masses of C1. Mild 3 mm anterolisthesis C4-5 and C5-6. Skull base and vertebrae: No acute fracture. No primary bone lesion or focal pathologic process. Soft tissues and spinal canal: No prevertebral edema. No visible canal hematoma. Disc levels: Moderate to severe multilevel degenerative disc  disease in the cervical spine, most prominent at C6-7. Marked bilateral facet arthropathy. Mild degenerative foraminal stenosis bilaterally at C3-4 and C4-5. Upper chest: No acute abnormality. Tiny calcified posterior right upper lobe granuloma. Other: Visualized mastoid air cells appear clear. No discrete thyroid nodules. No pathologically enlarged cervical nodes. IMPRESSION: CT HEAD: 1. No evidence of acute intracranial abnormality. No evidence of calvarial fracture. 2. Generalized cerebral volume loss and moderate chronic small vessel ischemic changes in the cerebral white matter. CT CERVICAL SPINE: 1. No cervical spine fracture or facet subluxation. 2. Moderate to severe multilevel cervical degenerative changes as detailed. Electronically Signed   By: Ilona Sorrel M.D.   On: 06/01/2018  18:37   Ct Cervical Spine Wo Contrast  Result Date: 06/01/2018 CLINICAL DATA:  Multiple recent falls. Reported head injury. History of CVA x2 and colon cancer. EXAM: CT HEAD WITHOUT CONTRAST CT CERVICAL SPINE WITHOUT CONTRAST TECHNIQUE: Multidetector CT imaging of the head and cervical spine was performed following the standard protocol without intravenous contrast. Multiplanar CT image reconstructions of the cervical spine were also generated. COMPARISON:  09/16/2015 head CT. FINDINGS: CT HEAD FINDINGS Brain: No evidence of parenchymal hemorrhage or extra-axial fluid collection. Stable posterior fossa cyst asymmetric to the left along posterior cerebellum compatible with arachnoid cyst. No new mass lesion, mass effect, or midline shift. No CT evidence of acute infarction. Generalized cerebral volume loss. Nonspecific moderate subcortical and periventricular white matter hypodensity, most in keeping with chronic small vessel ischemic change. No ventriculomegaly. Vascular: No acute abnormality. Skull: No evidence of calvarial fracture. Sinuses/Orbits: The visualized paranasal sinuses are essentially clear. Other:  The mastoid air cells are unopacified. CT CERVICAL SPINE FINDINGS Alignment: Exaggerated thoracic kyphosis with normal cervical lordosis. No facet subluxation. Dens is well positioned between the lateral masses of C1. Mild 3 mm anterolisthesis C4-5 and C5-6. Skull base and vertebrae: No acute fracture. No primary bone lesion or focal pathologic process. Soft tissues and spinal canal: No prevertebral edema. No visible canal hematoma. Disc levels: Moderate to severe multilevel degenerative disc disease in the cervical spine, most prominent at C6-7. Marked bilateral facet arthropathy. Mild degenerative foraminal stenosis bilaterally at C3-4 and C4-5. Upper chest: No acute abnormality. Tiny calcified posterior right upper lobe granuloma. Other: Visualized mastoid air cells appear clear. No discrete thyroid  nodules. No pathologically enlarged cervical nodes. IMPRESSION: CT HEAD: 1. No evidence of acute intracranial abnormality. No evidence of calvarial fracture. 2. Generalized cerebral volume loss and moderate chronic small vessel ischemic changes in the cerebral white matter. CT CERVICAL SPINE: 1. No cervical spine fracture or facet subluxation. 2. Moderate to severe multilevel cervical degenerative changes as detailed. Electronically Signed   By: Ilona Sorrel M.D.   On: 06/01/2018 18:37   Dg Hip Unilat With Pelvis 1v Right  Result Date: 06/02/2018 CLINICAL DATA:  Multiple falls over the past few days. Confusion. EXAM: DG HIP (WITH OR WITHOUT PELVIS) 1V RIGHT COMPARISON:  Plain film of the abdomen dated 06/20/2010. FINDINGS: Single-view of the pelvis and single view of the RIGHT hip are provided. LEFT hip arthroplasty hardware appears intact and stable in position. No acute osseous fracture or dislocation seen. Soft tissues about the pelvis and RIGHT hip are unremarkable. IMPRESSION: No acute findings. Electronically Signed   By: Franki Cabot M.D.   On: 06/02/2018 08:03     Not all labs, radiology exams or other studies done during hospitalization come through on my EPIC note; however they are reviewed by me.    Assessment  and Plan  Chest pain/coronary artery disease-it was reported the [name] has had chest pain several days prior after fall but it had resolved; EKG with ST depression in lateral precordial leads 11/29; serial troponins were negative discussion with cardiology over the phone recommended Imdur 15 mg daily SNF- admit for OT/PT continue Imdur 50 mg daily metoprolol 37.5 mg twice daily, [name] on Lipitor and sublingual nitroglycerin as needed  Right leg weakness-resolved during hospitalization SNF- admitted for OT/PT  E. coli UTI-pansensitive; treated with Rocephin 11/7-28 and amoxicillin 1130-12/2  AKI on CKD 3-improved baseline 1-1.3; likely due to mild rhabdo SNF- follow-up  BMP   chronic diastolic congestive heart failure-echo in 11/2016 with a EF 65 to 70%, mild direct LVH, severely dilated LA and RA, moderate TV regurg MP APP of 90 mmHg SNF- continue metoprolol 37.5 mg twice daily, Lasix 40 mg daily  Pulmonary hypertension- echo with severely dilated LA and RA, moderate TV regurg and PAPP-A of 90 mmHg SNF- continue Lasix 40 mg daily  Chronic atrial fibrillation- chads Vascor 6 SNF- continue metoprolol 37.5 mg twice daily and verapamil 120 mg daily; prophylaxed with Coumadin with a subtherapeutic INR; will be starting with 2 mg daily   Time spent greater than 45 minutes;> 50% of time with [name] was spent reviewing records, labs, tests and studies, counseling and developing plan of care  Webb Silversmith D. Hendricks Coil, MD

## 2018-06-07 NOTE — Telephone Encounter (Signed)
Patient is currently in rehab facility per daughter.

## 2018-06-07 NOTE — Telephone Encounter (Signed)
Spoke with patients daughter to see if we could schedule Hospital follow up. Daughter states patient went to Rehab facility yesterday and she is not sure how long she will be there.

## 2018-06-07 NOTE — Telephone Encounter (Signed)
thx

## 2018-06-08 ENCOUNTER — Ambulatory Visit: Payer: Medicare Other | Admitting: Internal Medicine

## 2018-06-09 ENCOUNTER — Other Ambulatory Visit: Payer: Self-pay | Admitting: *Deleted

## 2018-06-09 NOTE — Patient Outreach (Signed)
[facility]) Care Management  06/09/2018  [place] 03-13-1927 [id number]   Facility site visit to [facility].  Spoke with Katrinka Blazing SW/Discharge planner.  Ms. Shon Hale stated patient had just arrived to facility this week after a stay at the hospital for weakness, UTI, and a fall at home.  Patient lives with her daughter and has 15 stairs to climb in the home.  No discharge date has been set related to patient just arrived.  Went to see patient in room 103.  Patient siting in her recliner.  Patient told me about her fall and the UTI.  She stated she thought she was continuing to get medication for it.  Patient stated she continues to feel very weak but she was able to walk a bit today and was feeing very proud of herself.  Patient stated her daughter is her transportation and also fixes her pill Environmental education officer.  [place] [employer] services and gave a [facility] packet for patient and her daughter to look over.    Plan to visit patient again as discharge plans become more clear.   Rutherford Limerick RN, BSN [place] Acute Care Coordinator [phone]) Business Mobile [phone]) Toll free office

## 2018-06-10 ENCOUNTER — Other Ambulatory Visit: Payer: Medicare Other

## 2018-06-13 LAB — BASIC METABOLIC PANEL
BUN: 38 — AB (ref 4–21)
Creatinine: 1.1 (ref 0.5–1.1)
GLUCOSE: 85
Potassium: 3.7 (ref 3.4–5.3)
Sodium: 148 — AB (ref 137–147)

## 2018-06-13 LAB — CBC AND DIFFERENTIAL
HCT: 35 — AB (ref 36–46)
Hemoglobin: 12 (ref 12.0–16.0)
Platelets: 84 — AB (ref 150–399)
WBC: 3.9

## 2018-06-17 ENCOUNTER — Ambulatory Visit: Payer: Medicare Other | Admitting: Internal Medicine

## 2018-06-19 ENCOUNTER — Encounter: Payer: Self-pay | Admitting: Neurology

## 2018-06-21 ENCOUNTER — Other Ambulatory Visit: Payer: Medicare Other

## 2018-06-27 LAB — BASIC METABOLIC PANEL
BUN: 47 — AB (ref 4–21)
Creatinine: 1.6 — AB (ref 0.5–1.1)
GLUCOSE: 103
Potassium: 4.1 (ref 3.4–5.3)
Sodium: 146 (ref 137–147)

## 2018-06-30 ENCOUNTER — Non-Acute Institutional Stay (SKILLED_NURSING_FACILITY): Payer: Medicare Other | Admitting: Internal Medicine

## 2018-06-30 DIAGNOSIS — R600 Localized edema: Secondary | ICD-10-CM | POA: Diagnosis not present

## 2018-07-03 ENCOUNTER — Encounter: Payer: Self-pay | Admitting: Internal Medicine

## 2018-07-03 NOTE — Progress Notes (Signed)
Location:   [facility] of Service:   Skilled nursing facility Provider: [name] MD   Patient Care Team: Colon Branch, MD as PCP - General (Internal Medicine)  Extended Emergency Contact Information Primary Emergency Contact: [facility] Address: [address]          [place], [place] [address] [name] of [place] Phone: [phone] Mobile Phone: [phone] Relation: Daughter    Allergies: Patient has no known allergies.  Chief Complaint  Patient presents with  . Acute Visit    HPI: Patient is 84 y.o. female who being seen for noted swelling of her right leg.  Patient noted the swelling yesterday and today she complains of pain in her groin.  Patient admits to leg does hurt today, more when she moves it.  Patient denies any chest pain or shortness of breath, no fever.  Past Medical History:  Diagnosis Date  . Acute encephalopathy   . Acute renal failure superimposed on stage 3 chronic kidney disease ([place])   . Anemia   . Atrial fibrillation ([place])   . Colon cancer ([place]) 12/11  . Colon cancer ([place]) 2011  . Diarrhea   . DVT (deep venous thrombosis) ([place]) 11/06  . Elevated brain natriuretic peptide (BNP) level   . Frequent headaches   . Heart murmur   . Hyperkalemia   . Hypertension   . Pulmonary embolism ([place]) 11/06  . Seizures ([place])   . Stroke [facility]) 11/30/2014    Past Surgical History:  Procedure Laterality Date  . APPENDECTOMY  1952  . COLECTOMY  06/2010   partial, Dr. Donne Hazel complicated by LLE CVT; S/P IVC umbrella & anemia  . HIP FRACTURE SURGERY  2006   Trauma  . TOTAL ABDOMINAL HYSTERECTOMY W/ BILATERAL SALPINGOOPHORECTOMY  1973   For Fibroids  . TOTAL HIP ARTHROPLASTY  01/03/07   Left hip replacement.  Marland Kitchen VENA CAVA FILTER PLACEMENT  06/28/2010    Allergies as of 06/30/2018   No Known Allergies     Medication List       Accurate as of June 30, 2018 11:59 PM. Always use your most recent med list.        acetaminophen 500 MG tablet Commonly known as:  TYLENOL Take 1,000 mg by mouth every 8 (eight) hours as needed for headache (pain).   ADVAIR DISKUS 100-50 MCG/DOSE Aepb Generic drug:  Fluticasone-Salmeterol Inhale 1 puff into the lungs 2 (two) times daily.   albuterol (2.5 MG/3ML) 0.083% nebulizer solution Commonly known as:  PROVENTIL Take 3 mLs (2.5 mg total) by nebulization 3 (three) times daily as needed for wheezing or shortness of breath.   PROAIR HFA 108 (90 Base) MCG/ACT inhaler Generic drug:  albuterol Inhale 2 puffs into the lungs every 6 (six) hours as needed for wheezing or shortness of breath.   atorvastatin 20 MG tablet Commonly known as:  LIPITOR Take 1 tablet (20 mg total) by mouth daily.   CALCIUM 600-D PO Take 1 tablet by mouth daily.   calcium-vitamin D 500-200 MG-UNIT tablet Commonly known as:  OSCAL WITH D Take 1 tablet by mouth daily with breakfast.   ENSURE Take 237 mLs by mouth daily with breakfast. Milk chocolate   furosemide 40 MG tablet Commonly known as:  LASIX Take 1 tablet (40 mg total) by mouth daily.   isosorbide mononitrate 30 MG 24 hr tablet Commonly known as:  IMDUR Take 0.5 tablets (15 mg total) by mouth daily.   loratadine 10 MG  tablet Commonly known as:  CLARITIN Take 10 mg by mouth daily as needed (seasonal allergies).   metoprolol tartrate 25 MG tablet Commonly known as:  LOPRESSOR Take 1.5 tablets (37.5 mg total) by mouth 2 (two) times daily.   multivitamin with minerals Tabs tablet Take 1 tablet by mouth daily. Centrum Silver   multivitamin with minerals Tabs tablet Take 1 tablet by mouth daily.   nitroGLYCERIN 0.4 MG SL tablet Commonly known as:  NITROSTAT Place 1 tablet (0.4 mg total) under the tongue every 5 (five) minutes as needed for chest pain.   promethazine 12.5 MG tablet Commonly known as:  PHENERGAN Take 1 tablet (12.5 mg total) by mouth 3 (three) times daily as needed for nausea or vomiting.    QUEtiapine 25 MG tablet Commonly known as:  SEROQUEL Take 1-2 tablets (25-50 mg total) by mouth at bedtime.   traMADol 50 MG tablet Commonly known as:  ULTRAM Take 1 tablet (50 mg total) by mouth every 6 (six) hours as needed for moderate pain.   verapamil 120 MG CR tablet Commonly known as:  CALAN-SR Take 1 tablet (120 mg total) by mouth at bedtime.   warfarin 2.5 MG tablet Commonly known as:  COUMADIN Take as directed by the anticoagulation clinic. If you are unsure how to take this medication, talk to your nurse or doctor. Original instructions:  USE AS DIRECTED BY COUMADIN CLINIC   warfarin 2 MG tablet Commonly known as:  COUMADIN Take as directed by the anticoagulation clinic. If you are unsure how to take this medication, talk to your nurse or doctor. Original instructions:  Take 1 tablet (2 mg total) by mouth daily.       No orders of the defined types were placed in this encounter.   Immunization History  Administered Date(s) Administered  . Influenza Split 03/30/2012  . Influenza Whole 04/30/2006, 04/20/2008, 04/15/2009  . Influenza, High Dose Seasonal PF 04/20/2013, 03/27/2015, 03/31/2016, 04/22/2017, 04/20/2018  . Influenza,inj,Quad PF,6+ Mos 05/23/2014  . Pneumococcal Conjugate-13 04/24/2015  . Pneumococcal Polysaccharide-23 07/06/2009  . Td 10/30/2016    Social History   Tobacco Use  . Smoking status: Passive Smoke Exposure - Never Smoker  . Smokeless tobacco: Never Used  . Tobacco comment: husband smoked in home.   Substance Use Topics  . Alcohol use: No    Alcohol/week: 0.0 standard drinks    Review of Systems  DATA OBTAINED: from patient, nurse GENERAL:  no fevers, fatigue, appetite changes SKIN: No itching, rash HEENT: No complaint RESPIRATORY: No cough, wheezing, SOB CARDIAC: No chest pain, palpitations, right lower extremity edema and pain GI: No abdominal pain, No N/V/D or constipation, No heartburn or reflux  GU: No dysuria, frequency  or urgency, or incontinence  MUSCULOSKELETAL: No unrelieved bone/joint pain NEUROLOGIC: No headache, dizziness  PSYCHIATRIC: No overt anxiety or sadness  Vitals:   07/03/18 1626  BP: (!) 119/55  Pulse: 78  Resp: 18  Temp: (!) 97.2 F (36.2 C)   There is no height or weight on file to calculate BMI. Physical Exam  GENERAL APPEARANCE: Alert, conversant, No acute distress  SKIN: No diaphoresis rash HEENT: Unremarkable RESPIRATORY: Breathing is even, unlabored. Lung sounds are clear   CARDIOVASCULAR: Heart RRR no murmurs, rubs or gallops.  Right extremity is approximately twice as large as the left extremity; thigh is involved as well as calf; skin of distal right leg is a little pink with a little mild heat GASTROINTESTINAL: Abdomen is soft, non-tender, not distended w/ normal  bowel sounds.  GENITOURINARY: Bladder non tender, not distended  MUSCULOSKELETAL: No abnormal joints or musculature NEUROLOGIC: Cranial nerves 2-12 grossly intact. Moves all extremities PSYCHIATRIC: Mood and affect appropriate to situation, no behavioral issues  Patient Active Problem List   Diagnosis Date Noted  . Chest pain 06/07/2018  . CAD (coronary artery disease) 06/07/2018  . E. coli UTI 06/07/2018  . Chronic diastolic (congestive) heart failure ([place]) 06/07/2018  . Pulmonary hypertension, primary ([place]) 06/07/2018  . Right leg weakness 06/02/2018  . Generalized weakness   . Fall 06/01/2018  . Abnormal LFTs 06/01/2018  . Hallucination 06/01/2018  . Abnormal chest x-ray 09/28/2015  . Edema of right lower extremity 09/28/2015  . Chronic atrial fibrillation 09/28/2015  . Elevated brain natriuretic peptide (BNP) level 09/17/2015  . Acute renal failure superimposed on stage 3 chronic kidney disease ([place]) 09/17/2015  . Seizures ([place]) 09/16/2015  . Epistaxis 09/07/2015  . Asthma 08/14/2015  . PCP NOTES >>>>>>>>>>>>>>>>>>>>>>>>>>>>>> 04/24/2015  . TIA (transient ischemic attack) 12/11/2014  .  Dyslipidemia 12/11/2014  . History of CVA (cerebrovascular accident) 12/01/2014  . UTI (urinary tract infection) 12/01/2014  . Thrombocytopenia ([place]) 12/01/2014  . Chronic renal insufficiency, stage II (mild) 11/12/2013  . Impingement syndrome, shoulder, left 11/09/2013  . Acquired short bowel syndrome 11/09/2013  . Annual physical exam 08/30/2013  . Hx pulmonary embolism 06/27/2013  . Warfarin anticoagulation 06/27/2013  . DDD (degenerative disc disease), lumbar 11/18/2012  . Mitral regurgitation 08/26/2011  . DYSPHAGIA UNSPECIFIED 07/31/2010  . ATRIAL FIBRILLATION   07/08/2010  . COLON CANCER, HX OF 07/08/2010  . DIZZINESS 10/18/2009  . Essential hypertension 12/16/2007  . HIP PAIN, CHRONIC 12/28/2006  . ANEMIA-NOS 07/15/2006  . Personal history of venous thrombosis and embolism 07/15/2006    CMP     Component Value Date/Time   NA 146 06/27/2018   NA 142 06/09/2012 1509   K 4.1 06/27/2018   K 3.9 06/09/2012 1509   CL 112 (H) 06/05/2018 0542   CL 108 (H) 06/09/2012 1509   CO2 24 06/05/2018 0542   CO2 26 06/09/2012 1509   GLUCOSE 90 06/05/2018 0542   GLUCOSE 103 (H) 06/09/2012 1509   BUN 47 (A) 06/27/2018   BUN 17.0 06/09/2012 1509   CREATININE 1.6 (A) 06/27/2018   CREATININE 1.42 (H) 06/05/2018 0542   CREATININE 1.09 (H) 10/30/2016 1511   CREATININE 1.2 (H) 06/09/2012 1509   CALCIUM 8.8 (L) 06/05/2018 0542   CALCIUM 9.5 06/09/2012 1509   PROT 5.7 (L) 06/04/2018 0731   PROT 6.9 01/13/2018 1620   PROT 7.2 06/09/2012 1509   ALBUMIN 3.6 06/04/2018 0731   ALBUMIN 4.6 01/13/2018 1620   ALBUMIN 4.2 06/09/2012 1509   AST 41 06/04/2018 0731   AST 17 06/09/2012 1509   ALT 21 06/04/2018 0731   ALT 11 06/09/2012 1509   ALKPHOS 31 (L) 06/04/2018 0731   ALKPHOS 47 06/09/2012 1509   BILITOT 1.2 06/04/2018 0731   BILITOT 1.1 01/13/2018 1620   BILITOT 0.73 06/09/2012 1509   GFRNONAA 33 (L) 06/05/2018 0542   GFRAA 38 (L) 06/05/2018 0542   Recent Labs    06/03/18 0453  06/04/18 0431 06/05/18 0542 06/13/18 06/27/18  NA 144 144 145 148* 146  K 4.1 4.1 4.4 3.7 4.1  CL 113* 108 112*  --   --   CO2 23 22 24   --   --   GLUCOSE 85 94 90  --   --   BUN 43* 34* 29* 38* 47*  CREATININE  1.70* 1.47* 1.42* 1.1 1.6*  CALCIUM 9.2 9.0 8.8*  --   --    Recent Labs    01/13/18 1620 06/01/18 1726 06/04/18 0731  AST 25 64* 41  ALT 17 23 21   ALKPHOS 49 39 31*  BILITOT 1.1 2.0* 1.2  PROT 6.9 6.7 5.7*  ALBUMIN 4.6 4.5 3.6   Recent Labs    08/16/17 1408  06/01/18 1726 06/02/18 0223 06/05/18 0542 06/13/18  WBC 5.3   < > 7.3 5.9 3.8* 3.9  NEUTROABS 3,662  --  5.7  --   --   --   HGB 13.9   < > 13.8 12.3 11.3* 12.0  HCT 40.6   < > 43.5 38.5 36.4 35*  MCV 89.0   < > 98.0 96.5 98.1  --   PLT 92*   < > 63* 58* 56* 84*   < > = values in this interval not displayed.   Recent Labs    08/16/17 1408  CHOL 97  LDLCALC 48  TRIG 56.0   No results found for: [facility] Lab Results  Component Value Date   TSH 2.35 11/08/2013   Lab Results  Component Value Date   HGBA1C 5.6 11/13/2013   Lab Results  Component Value Date   CHOL 97 08/16/2017   HDL 37.70 (L) 08/16/2017   LDLCALC 48 08/16/2017   TRIG 56.0 08/16/2017   CHOLHDL 3 08/16/2017    Significant Diagnostic Results in last 30 days:  No results found.  Assessment and Plan  Right lower extremity edema- groin plain and thigh swelling are especially worrisome; I have ordered venous Doppler ultrasound to rule out DVT    Inocencio Homes, MD

## 2018-07-07 ENCOUNTER — Encounter: Payer: Self-pay | Admitting: Internal Medicine

## 2018-07-07 ENCOUNTER — Non-Acute Institutional Stay (SKILLED_NURSING_FACILITY): Payer: Medicare Other | Admitting: Internal Medicine

## 2018-07-07 DIAGNOSIS — I10 Essential (primary) hypertension: Secondary | ICD-10-CM

## 2018-07-07 DIAGNOSIS — I4811 Longstanding persistent atrial fibrillation: Secondary | ICD-10-CM

## 2018-07-07 DIAGNOSIS — I5032 Chronic diastolic (congestive) heart failure: Secondary | ICD-10-CM | POA: Diagnosis not present

## 2018-07-07 NOTE — Progress Notes (Signed)
Location:  [place] Room Number: 103P Place of Service:  SNF (31)  [name]. Megan Coil, MD  Patient Care Team: Colon Branch, MD as PCP - General (Internal Medicine)  Extended Emergency Contact Information Primary Emergency Contact: [facility] Address: [address]          [place], [place] [address] [name] of [place] Phone: [phone] Mobile Phone: [phone] Relation: Daughter    Allergies: Patient has no known allergies.  Chief Complaint  Patient presents with  . Medical Management of Chronic Issues    Routine Visit    HPI: Patient is 85 y.o. female who is being seen for routine issues of hypertension, atrial fibrillation, and chronic diastolic congestive heart failure.  Past Medical History:  Diagnosis Date  . Acute encephalopathy   . Acute renal failure superimposed on stage 3 chronic kidney disease ([place])   . Anemia   . Atrial fibrillation ([place])   . Colon cancer ([place]) 12/11  . Colon cancer ([place]) 2011  . Diarrhea   . DVT (deep venous thrombosis) ([place]) 11/06  . Elevated brain natriuretic peptide (BNP) level   . Frequent headaches   . Heart murmur   . Hyperkalemia   . Hypertension   . Pulmonary embolism ([place]) 11/06  . Seizures ([place])   . Stroke [facility]) 11/30/2014    Past Surgical History:  Procedure Laterality Date  . APPENDECTOMY  1952  . COLECTOMY  06/2010   partial, Dr. Donne Hazel complicated by LLE CVT; S/P IVC umbrella & anemia  . HIP FRACTURE SURGERY  2006   Trauma  . TOTAL ABDOMINAL HYSTERECTOMY W/ BILATERAL SALPINGOOPHORECTOMY  1973   For Fibroids  . TOTAL HIP ARTHROPLASTY  01/03/07   Left hip replacement.  Marland Kitchen VENA CAVA FILTER PLACEMENT  06/28/2010    Allergies as of 07/07/2018   No Known Allergies     Medication List       Accurate as of July 07, 2018 11:59 PM. Always use your most recent med list.        acetaminophen 500 MG tablet Commonly known as:  TYLENOL Take 1,000 mg by  mouth every 8 (eight) hours as needed for headache (pain).   ADVAIR DISKUS 100-50 MCG/DOSE Aepb Generic drug:  Fluticasone-Salmeterol Inhale 1 puff into the lungs 2 (two) times daily.   albuterol (2.5 MG/3ML) 0.083% nebulizer solution Commonly known as:  PROVENTIL Take 3 mLs (2.5 mg total) by nebulization 3 (three) times daily as needed for wheezing or shortness of breath.   PROAIR HFA 108 (90 Base) MCG/ACT inhaler Generic drug:  albuterol Inhale 2 puffs into the lungs every 6 (six) hours as needed for wheezing or shortness of breath.   atorvastatin 20 MG tablet Commonly known as:  LIPITOR Take 1 tablet (20 mg total) by mouth daily.   CALCIUM 600-D PO Take 1 tablet by mouth daily.   calcium-vitamin D 500-200 MG-UNIT tablet Commonly known as:  OSCAL WITH D Take 1 tablet by mouth daily with breakfast.   ENSURE Take 237 mLs by mouth daily with breakfast. Milk chocolate   furosemide 40 MG tablet Commonly known as:  LASIX Take 1 tablet (40 mg total) by mouth daily.   isosorbide mononitrate 30 MG 24 hr tablet Commonly known as:  IMDUR Take 0.5 tablets (15 mg total) by mouth daily.   loratadine 10 MG tablet Commonly known as:  CLARITIN Take 10 mg by mouth daily as needed (seasonal allergies).   metoprolol tartrate 25  MG tablet Commonly known as:  LOPRESSOR Take 1.5 tablets (37.5 mg total) by mouth 2 (two) times daily.   multivitamin with minerals Tabs tablet Take 1 tablet by mouth daily. Centrum Silver   multivitamin with minerals Tabs tablet Take 1 tablet by mouth daily.   nitroGLYCERIN 0.4 MG SL tablet Commonly known as:  NITROSTAT Place 1 tablet (0.4 mg total) under the tongue every 5 (five) minutes as needed for chest pain.   promethazine 12.5 MG tablet Commonly known as:  PHENERGAN Take 1 tablet (12.5 mg total) by mouth 3 (three) times daily as needed for nausea or vomiting.   QUEtiapine 25 MG tablet Commonly known as:  SEROQUEL Take 1-2 tablets (25-50 mg  total) by mouth at bedtime.   traMADol 50 MG tablet Commonly known as:  ULTRAM Take 1 tablet (50 mg total) by mouth every 6 (six) hours as needed for moderate pain.   verapamil 120 MG CR tablet Commonly known as:  CALAN-SR Take 1 tablet (120 mg total) by mouth at bedtime.   warfarin 2 MG tablet Commonly known as:  COUMADIN Take as directed by the anticoagulation clinic. If you are unsure how to take this medication, talk to your nurse or doctor. Original instructions:  Take 1 tablet (2 mg total) by mouth daily.       No orders of the defined types were placed in this encounter.   Immunization History  Administered Date(s) Administered  . Influenza Split 03/30/2012  . Influenza Whole 04/30/2006, 04/20/2008, 04/15/2009  . Influenza, High Dose Seasonal PF 04/20/2013, 03/27/2015, 03/31/2016, 04/22/2017, 04/20/2018  . Influenza,inj,Quad PF,6+ Mos 05/23/2014  . Pneumococcal Conjugate-13 04/24/2015  . Pneumococcal Polysaccharide-23 07/06/2009  . Td 10/30/2016    Social History   Tobacco Use  . Smoking status: Passive Smoke Exposure - Never Smoker  . Smokeless tobacco: Never Used  . Tobacco comment: husband smoked in home.   Substance Use Topics  . Alcohol use: No    Alcohol/week: 0.0 standard drinks    Review of Systems  DATA OBTAINED: from patient, nurse GENERAL:  no fevers, fatigue, appetite changes SKIN: No itching, rash HEENT: No complaint RESPIRATORY: No cough, wheezing, SOB CARDIAC: No chest pain, palpitations, lower extremity edema  GI: No abdominal pain, No N/V/D or constipation, No heartburn or reflux  GU: No dysuria, frequency or urgency, or incontinence  MUSCULOSKELETAL: No unrelieved bone/joint pain NEUROLOGIC: No headache, dizziness  PSYCHIATRIC: No overt anxiety or sadness  Vitals:   07/07/18 1404  BP: (!) 146/64  Pulse: 70  Resp: 18  Temp: (!) 97.2 F (36.2 C)   Body mass index is 34.05 kg/m. Physical Exam  GENERAL APPEARANCE: Alert,  conversant, No acute distress  SKIN: No diaphoresis rash HEENT: Unremarkable RESPIRATORY: Breathing is even, unlabored. Lung sounds are clear   CARDIOVASCULAR: Heart irregular no murmurs, rubs or gallops. No peripheral edema  GASTROINTESTINAL: Abdomen is soft, non-tender, not distended w/ normal bowel sounds.  GENITOURINARY: Bladder non tender, not distended  MUSCULOSKELETAL: No abnormal joints or musculature NEUROLOGIC: Cranial nerves 2-12 grossly intact. Moves all extremities PSYCHIATRIC: Mood and affect appropriate to situation, no behavioral issues  Patient Active Problem List   Diagnosis Date Noted  . Chest pain 06/07/2018  . CAD (coronary artery disease) 06/07/2018  . E. coli UTI 06/07/2018  . Chronic diastolic (congestive) heart failure ([place]) 06/07/2018  . Pulmonary hypertension, primary ([place]) 06/07/2018  . Right leg weakness 06/02/2018  . Generalized weakness   . Fall 06/01/2018  . Abnormal LFTs 06/01/2018  .  Hallucination 06/01/2018  . Abnormal chest x-ray 09/28/2015  . Edema of right lower extremity 09/28/2015  . Chronic atrial fibrillation 09/28/2015  . Elevated brain natriuretic peptide (BNP) level 09/17/2015  . Acute renal failure superimposed on stage 3 chronic kidney disease ([place]) 09/17/2015  . Seizures ([place]) 09/16/2015  . Epistaxis 09/07/2015  . Asthma 08/14/2015  . PCP NOTES >>>>>>>>>>>>>>>>>>>>>>>>>>>>>> 04/24/2015  . TIA (transient ischemic attack) 12/11/2014  . Dyslipidemia 12/11/2014  . History of CVA (cerebrovascular accident) 12/01/2014  . UTI (urinary tract infection) 12/01/2014  . Thrombocytopenia ([place]) 12/01/2014  . Chronic renal insufficiency, stage II (mild) 11/12/2013  . Impingement syndrome, shoulder, left 11/09/2013  . Acquired short bowel syndrome 11/09/2013  . Annual physical exam 08/30/2013  . Hx pulmonary embolism 06/27/2013  . Warfarin anticoagulation 06/27/2013  . DDD (degenerative disc disease), lumbar 11/18/2012  . Mitral  regurgitation 08/26/2011  . DYSPHAGIA UNSPECIFIED 07/31/2010  . ATRIAL FIBRILLATION   07/08/2010  . COLON CANCER, HX OF 07/08/2010  . DIZZINESS 10/18/2009  . Essential hypertension 12/16/2007  . HIP PAIN, CHRONIC 12/28/2006  . ANEMIA-NOS 07/15/2006  . Personal history of venous thrombosis and embolism 07/15/2006    CMP     Component Value Date/Time   NA 146 06/27/2018   NA 142 06/09/2012 1509   K 4.1 06/27/2018   K 3.9 06/09/2012 1509   CL 112 (H) 06/05/2018 0542   CL 108 (H) 06/09/2012 1509   CO2 24 06/05/2018 0542   CO2 26 06/09/2012 1509   GLUCOSE 90 06/05/2018 0542   GLUCOSE 103 (H) 06/09/2012 1509   BUN 47 (A) 06/27/2018   BUN 17.0 06/09/2012 1509   CREATININE 1.6 (A) 06/27/2018   CREATININE 1.42 (H) 06/05/2018 0542   CREATININE 1.09 (H) 10/30/2016 1511   CREATININE 1.2 (H) 06/09/2012 1509   CALCIUM 8.8 (L) 06/05/2018 0542   CALCIUM 9.5 06/09/2012 1509   PROT 5.7 (L) 06/04/2018 0731   PROT 6.9 01/13/2018 1620   PROT 7.2 06/09/2012 1509   ALBUMIN 3.6 06/04/2018 0731   ALBUMIN 4.6 01/13/2018 1620   ALBUMIN 4.2 06/09/2012 1509   AST 41 06/04/2018 0731   AST 17 06/09/2012 1509   ALT 21 06/04/2018 0731   ALT 11 06/09/2012 1509   ALKPHOS 31 (L) 06/04/2018 0731   ALKPHOS 47 06/09/2012 1509   BILITOT 1.2 06/04/2018 0731   BILITOT 1.1 01/13/2018 1620   BILITOT 0.73 06/09/2012 1509   GFRNONAA 33 (L) 06/05/2018 0542   GFRAA 38 (L) 06/05/2018 0542   Recent Labs    06/03/18 0453 06/04/18 0431 06/05/18 0542 06/13/18 06/27/18  NA 144 144 145 148* 146  K 4.1 4.1 4.4 3.7 4.1  CL 113* 108 112*  --   --   CO2 23 22 24   --   --   GLUCOSE 85 94 90  --   --   BUN 43* 34* 29* 38* 47*  CREATININE 1.70* 1.47* 1.42* 1.1 1.6*  CALCIUM 9.2 9.0 8.8*  --   --    Recent Labs    01/13/18 1620 06/01/18 1726 06/04/18 0731  AST 25 64* 41  ALT 17 23 21   ALKPHOS 49 39 31*  BILITOT 1.1 2.0* 1.2  PROT 6.9 6.7 5.7*  ALBUMIN 4.6 4.5 3.6   Recent Labs    08/16/17 1408   06/01/18 1726 06/02/18 0223 06/05/18 0542 06/13/18  WBC 5.3   < > 7.3 5.9 3.8* 3.9  NEUTROABS 3,662  --  5.7  --   --   --  HGB 13.9   < > 13.8 12.3 11.3* 12.0  HCT 40.6   < > 43.5 38.5 36.4 35*  MCV 89.0   < > 98.0 96.5 98.1  --   PLT 92*   < > 63* 58* 56* 84*   < > = values in this interval not displayed.   Recent Labs    08/16/17 1408  CHOL 97  LDLCALC 48  TRIG 56.0   No results found for: [facility] Lab Results  Component Value Date   TSH 2.35 11/08/2013   Lab Results  Component Value Date   HGBA1C 5.6 11/13/2013   Lab Results  Component Value Date   CHOL 97 08/16/2017   HDL 37.70 (L) 08/16/2017   LDLCALC 48 08/16/2017   TRIG 56.0 08/16/2017   CHOLHDL 3 08/16/2017    Significant Diagnostic Results in last 30 days:  No results found.  Assessment and Plan  Essential hypertension Controlled for age; continue Lopressor 37.5 mg twice daily, verapamil ER 120 mg  daily and Lasix 40 mg daily  ATRIAL FIBRILLATION   Rate controlled with verapamil and Lopressor; and prophylaxed with Coumadin which is actively titrated  Chronic diastolic (congestive) heart failure (HCC) No reported exacerbations;; continue metoprolol 37.5 mg twice daily and Lasix 40 mg daily     [name] D. Megan Coil, MD

## 2018-07-08 ENCOUNTER — Encounter: Payer: Self-pay | Admitting: Internal Medicine

## 2018-07-08 ENCOUNTER — Other Ambulatory Visit: Payer: Medicare Other

## 2018-07-08 DIAGNOSIS — F4320 Adjustment disorder, unspecified: Secondary | ICD-10-CM | POA: Diagnosis not present

## 2018-07-08 DIAGNOSIS — R443 Hallucinations, unspecified: Secondary | ICD-10-CM | POA: Diagnosis not present

## 2018-07-08 NOTE — Assessment & Plan Note (Signed)
Rate controlled with verapamil and Lopressor; and prophylaxed with Coumadin which is actively titrated

## 2018-07-08 NOTE — Assessment & Plan Note (Signed)
No reported exacerbations;; continue metoprolol 37.5 mg twice daily and Lasix 40 mg daily

## 2018-07-08 NOTE — Assessment & Plan Note (Signed)
Controlled for age; continue Lopressor 37.5 mg twice daily, verapamil ER 120 mg  daily and Lasix 40 mg daily

## 2018-07-12 ENCOUNTER — Other Ambulatory Visit: Payer: Medicare Other

## 2018-07-13 ENCOUNTER — Non-Acute Institutional Stay (SKILLED_NURSING_FACILITY): Payer: Medicare Other | Admitting: Internal Medicine

## 2018-07-13 ENCOUNTER — Encounter: Payer: Self-pay | Admitting: Internal Medicine

## 2018-07-13 DIAGNOSIS — L03115 Cellulitis of right lower limb: Secondary | ICD-10-CM | POA: Diagnosis not present

## 2018-07-13 NOTE — Progress Notes (Signed)
:   Location:  [place] Room Number: 103P Place of Service:  SNF (31)  Casi Westerfeld D. [name], MD  Patient Care Team: Colon Branch, MD as PCP - General (Internal Medicine)  Extended Emergency Contact Information Primary Emergency Contact: [facility] Address: [address]          [place],  [address] [name] of [place] Phone: [phone] Mobile Phone: [phone] Relation: Daughter     Allergies: Patient has no known allergies.  Chief Complaint  Patient presents with  . Acute Visit    HPI: Patient is 85 y.o. female whose daughter frantically stopped myself and Monnette in the hall because her mother's leg is swollen and red and she thinks she has cellulitis.  Patient's leg is swollen but no larger than before, and we have ultrasounded for DVT and it was negative.  Patient does have a pink blush to her skin on shin with minimal if any heat but it is not tender to palpation and there is no swelling.  Past Medical History:  Diagnosis Date  . Acute encephalopathy   . Acute renal failure superimposed on stage 3 chronic kidney disease ([place])   . Anemia   . Atrial fibrillation ([place])   . Colon cancer ([place]) 12/11  . Colon cancer ([place]) 2011  . Diarrhea   . DVT (deep venous thrombosis) ([place]) 11/06  . Elevated brain natriuretic peptide (BNP) level   . Frequent headaches   . Heart murmur   . Hyperkalemia   . Hypertension   . Pulmonary embolism ([place]) 11/06  . Seizures ([place])   . Stroke [facility]) 11/30/2014    Past Surgical History:  Procedure Laterality Date  . APPENDECTOMY  1952  . COLECTOMY  06/2010   partial, Dr. Donne Hazel complicated by LLE CVT; S/P IVC umbrella & anemia  . HIP FRACTURE SURGERY  2006   Trauma  . TOTAL ABDOMINAL HYSTERECTOMY W/ BILATERAL SALPINGOOPHORECTOMY  1973   For Fibroids  . TOTAL HIP ARTHROPLASTY  01/03/07   Left hip replacement.  Marland Kitchen VENA CAVA FILTER PLACEMENT  06/28/2010    Allergies as of  07/13/2018   No Known Allergies     Medication List       Accurate as of July 13, 2018  3:02 PM. Always use your most recent med list.        acetaminophen 500 MG tablet Commonly known as:  TYLENOL Take 1,000 mg by mouth every 8 (eight) hours as needed for headache (pain).   ADVAIR DISKUS 100-50 MCG/DOSE Aepb Generic drug:  Fluticasone-Salmeterol Inhale 1 puff into the lungs 2 (two) times daily.   albuterol (2.5 MG/3ML) 0.083% nebulizer solution Commonly known as:  PROVENTIL Take 3 mLs (2.5 mg total) by nebulization 3 (three) times daily as needed for wheezing or shortness of breath.   PROAIR HFA 108 (90 Base) MCG/ACT inhaler Generic drug:  albuterol Inhale 2 puffs into the lungs every 6 (six) hours as needed for wheezing or shortness of breath.   atorvastatin 20 MG tablet Commonly known as:  LIPITOR Take 1 tablet (20 mg total) by mouth daily.   calcium-vitamin D 500-200 MG-UNIT tablet Commonly known as:  OSCAL WITH D Take 1 tablet by mouth daily with breakfast.   ENSURE Take 237 mLs by mouth daily with breakfast. Milk chocolate   furosemide 80 MG tablet Commonly known as:  LASIX Take 80 mg by mouth daily.   isosorbide mononitrate 30 MG 24 hr tablet Commonly known  as:  IMDUR Take 0.5 tablets (15 mg total) by mouth daily.   KEFLEX 500 MG capsule Generic drug:  cephALEXin Take 500 mg by mouth. take 1 capsule po three times daily x 7 days for possible cellulitis to RLE   loratadine 10 MG tablet Commonly known as:  CLARITIN Take 10 mg by mouth daily as needed (seasonal allergies).   metoprolol tartrate 25 MG tablet Commonly known as:  LOPRESSOR Take 1.5 tablets (37.5 mg total) by mouth 2 (two) times daily.   multivitamin with minerals Tabs tablet Take 1 tablet by mouth daily.   nitroGLYCERIN 0.4 MG SL tablet Commonly known as:  NITROSTAT Place 1 tablet (0.4 mg total) under the tongue every 5 (five) minutes as needed for chest pain.   promethazine 12.5 MG  tablet Commonly known as:  PHENERGAN Take 1 tablet (12.5 mg total) by mouth 3 (three) times daily as needed for nausea or vomiting.   QUEtiapine 25 MG tablet Commonly known as:  SEROQUEL Take 1-2 tablets (25-50 mg total) by mouth at bedtime.   traMADol 50 MG tablet Commonly known as:  ULTRAM Take 1 tablet (50 mg total) by mouth every 6 (six) hours as needed for moderate pain.   verapamil 120 MG CR tablet Commonly known as:  CALAN-SR Take 1 tablet (120 mg total) by mouth at bedtime.   warfarin 2 MG tablet Commonly known as:  COUMADIN Take as directed by the anticoagulation clinic. If you are unsure how to take this medication, talk to your nurse or doctor. Original instructions:  Take 1 tablet (2 mg total) by mouth daily.       No orders of the defined types were placed in this encounter.   Immunization History  Administered Date(s) Administered  . Influenza Split 03/30/2012  . Influenza Whole 04/30/2006, 04/20/2008, 04/15/2009  . Influenza, High Dose Seasonal PF 04/20/2013, 03/27/2015, 03/31/2016, 04/22/2017, 04/20/2018  . Influenza,inj,Quad PF,6+ Mos 05/23/2014  . Pneumococcal Conjugate-13 04/24/2015  . Pneumococcal Polysaccharide-23 07/06/2009  . Td 10/30/2016    Social History   Tobacco Use  . Smoking status: Passive Smoke Exposure - Never Smoker  . Smokeless tobacco: Never Used  . Tobacco comment: husband smoked in home.   Substance Use Topics  . Alcohol use: No    Alcohol/week: 0.0 standard drinks    Family history is   Family History  Problem Relation Age of Onset  . Peripheral vascular disease Father   . Heart attack Mother 64  . Heart disease Mother        MI  . Coronary artery disease Sister   . Diabetes Paternal Grandmother   . Coronary artery disease Maternal Grandfather       Review of Systems  DATA OBTAINED: from patient, family member GENERAL:  no fevers, fatigue, appetite changes SKIN: Cellulitis per daughter right leg EYES: No eye  pain, redness, discharge EARS: No earache, tinnitus, change in hearing NOSE: No congestion, drainage or bleeding  MOUTH/THROAT: No mouth or tooth pain, No sore throat RESPIRATORY: No cough, wheezing, SOB CARDIAC: No chest pain, palpitations, lower extremity edema  GI: No abdominal pain, No N/V/D or constipation, No heartburn or reflux  GU: No dysuria, frequency or urgency, or incontinence  MUSCULOSKELETAL: No unrelieved bone/joint pain NEUROLOGIC: No headache, dizziness or focal weakness PSYCHIATRIC: No c/o anxiety or sadness   Vitals:   07/13/18 1451  BP: (!) 149/76  Pulse: 88  Resp: 19  Temp: 98.8 F (37.1 C)    SpO2 Readings from Last  1 Encounters:  06/06/18 96%   Body mass index is 33.37 kg/m.     Physical Exam  GENERAL APPEARANCE: Alert, conversant,  No acute distress.  SKIN: Slightly pink blush to anterior leg with minimal to any heat no swelling, no tender to palpation does not seem different in color from prior examination HEAD: Normocephalic, atraumatic  EYES: Conjunctiva/lids clear. Pupils round, reactive. EOMs intact.  EARS: External exam WNL, canals clear. Hearing grossly normal.  NOSE: No deformity or discharge.  MOUTH/THROAT: Lips w/o lesions  RESPIRATORY: Breathing is even, unlabored. Lung sounds are clear   CARDIOVASCULAR: Heart RRR no murmurs, rubs or gallops. No peripheral edema.   GASTROINTESTINAL: Abdomen is soft, non-tender, not distended w/ normal bowel sounds. GENITOURINARY: Bladder non tender, not distended  MUSCULOSKELETAL: No abnormal joints or musculature NEUROLOGIC:  Cranial nerves 2-12 grossly intact. Moves all extremities  PSYCHIATRIC: Mood and affect appropriate to situation, no behavioral issues  Patient Active Problem List   Diagnosis Date Noted  . Chest pain 06/07/2018  . CAD (coronary artery disease) 06/07/2018  . E. coli UTI 06/07/2018  . Chronic diastolic (congestive) heart failure ([place]) 06/07/2018  . Pulmonary hypertension,  primary ([place]) 06/07/2018  . Right leg weakness 06/02/2018  . Generalized weakness   . Fall 06/01/2018  . Abnormal LFTs 06/01/2018  . Hallucination 06/01/2018  . Abnormal chest x-ray 09/28/2015  . Edema of right lower extremity 09/28/2015  . Chronic atrial fibrillation 09/28/2015  . Elevated brain natriuretic peptide (BNP) level 09/17/2015  . Acute renal failure superimposed on stage 3 chronic kidney disease ([place]) 09/17/2015  . Seizures ([place]) 09/16/2015  . Epistaxis 09/07/2015  . Asthma 08/14/2015  . PCP NOTES >>>>>>>>>>>>>>>>>>>>>>>>>>>>>> 04/24/2015  . TIA (transient ischemic attack) 12/11/2014  . Dyslipidemia 12/11/2014  . History of CVA (cerebrovascular accident) 12/01/2014  . UTI (urinary tract infection) 12/01/2014  . Thrombocytopenia ([place]) 12/01/2014  . Chronic renal insufficiency, stage II (mild) 11/12/2013  . Impingement syndrome, shoulder, left 11/09/2013  . Acquired short bowel syndrome 11/09/2013  . Annual physical exam 08/30/2013  . Hx pulmonary embolism 06/27/2013  . Warfarin anticoagulation 06/27/2013  . DDD (degenerative disc disease), lumbar 11/18/2012  . Mitral regurgitation 08/26/2011  . DYSPHAGIA UNSPECIFIED 07/31/2010  . ATRIAL FIBRILLATION   07/08/2010  . COLON CANCER, HX OF 07/08/2010  . DIZZINESS 10/18/2009  . Essential hypertension 12/16/2007  . HIP PAIN, CHRONIC 12/28/2006  . ANEMIA-NOS 07/15/2006  . Personal history of venous thrombosis and embolism 07/15/2006      Labs reviewed: Basic Metabolic Panel:    Component Value Date/Time   NA 146 06/27/2018   NA 142 06/09/2012 1509   K 4.1 06/27/2018   K 3.9 06/09/2012 1509   CL 112 (H) 06/05/2018 0542   CL 108 (H) 06/09/2012 1509   CO2 24 06/05/2018 0542   CO2 26 06/09/2012 1509   GLUCOSE 90 06/05/2018 0542   GLUCOSE 103 (H) 06/09/2012 1509   BUN 47 (A) 06/27/2018   BUN 17.0 06/09/2012 1509   CREATININE 1.6 (A) 06/27/2018   CREATININE 1.42 (H) 06/05/2018 0542   CREATININE 1.09 (H)  10/30/2016 1511   CREATININE 1.2 (H) 06/09/2012 1509   CALCIUM 8.8 (L) 06/05/2018 0542   CALCIUM 9.5 06/09/2012 1509   PROT 5.7 (L) 06/04/2018 0731   PROT 6.9 01/13/2018 1620   PROT 7.2 06/09/2012 1509   ALBUMIN 3.6 06/04/2018 0731   ALBUMIN 4.6 01/13/2018 1620   ALBUMIN 4.2 06/09/2012 1509   AST 41 06/04/2018 0731   AST 17 06/09/2012  1509   ALT 21 06/04/2018 0731   ALT 11 06/09/2012 1509   ALKPHOS 31 (L) 06/04/2018 0731   ALKPHOS 47 06/09/2012 1509   BILITOT 1.2 06/04/2018 0731   BILITOT 1.1 01/13/2018 1620   BILITOT 0.73 06/09/2012 1509   GFRNONAA 33 (L) 06/05/2018 0542   GFRAA 38 (L) 06/05/2018 0542    Recent Labs    06/03/18 0453 06/04/18 0431 06/05/18 0542 06/13/18 06/27/18  NA 144 144 145 148* 146  K 4.1 4.1 4.4 3.7 4.1  CL 113* 108 112*  --   --   CO2 23 22 24   --   --   GLUCOSE 85 94 90  --   --   BUN 43* 34* 29* 38* 47*  CREATININE 1.70* 1.47* 1.42* 1.1 1.6*  CALCIUM 9.2 9.0 8.8*  --   --    Liver Function Tests: Recent Labs    01/13/18 1620 06/01/18 1726 06/04/18 0731  AST 25 64* 41  ALT 17 23 21   ALKPHOS 49 39 31*  BILITOT 1.1 2.0* 1.2  PROT 6.9 6.7 5.7*  ALBUMIN 4.6 4.5 3.6   No results for input(s): LIPASE, AMYLASE in the last 8760 hours. No results for input(s): AMMONIA in the last 8760 hours. CBC: Recent Labs    08/16/17 1408  06/01/18 1726 06/02/18 0223 06/05/18 0542 06/13/18  WBC 5.3   < > 7.3 5.9 3.8* 3.9  NEUTROABS 3,662  --  5.7  --   --   --   HGB 13.9   < > 13.8 12.3 11.3* 12.0  HCT 40.6   < > 43.5 38.5 36.4 35*  MCV 89.0   < > 98.0 96.5 98.1  --   PLT 92*   < > 63* 58* 56* 84*   < > = values in this interval not displayed.   Lipid Recent Labs    08/16/17 1408  CHOL 97  HDL 37.70*  LDLCALC 48  TRIG 56.0    Cardiac Enzymes: Recent Labs    06/02/18 0223 06/04/18 0731 06/04/18 1428 06/04/18 1818 06/05/18 0030  CKTOTAL 557* 154  --   --   --   TROPONINI  --   --  <0.03 <0.03 <0.03   BNP: No results for  input(s): BNP in the last 8760 hours. No results found for: [facility] Lab Results  Component Value Date   HGBA1C 5.6 11/13/2013   Lab Results  Component Value Date   TSH 2.35 11/08/2013   Lab Results  Component Value Date   VITAMINB12 234 06/13/2010   Lab Results  Component Value Date   FOLATE  06/13/2010    4.9 (NOTE)  Reference Ranges        Deficient:       0.4 - 3.3 ng/mL        Indeterminate:   3.4 - 5.4 ng/mL        Normal:              > 5.4 ng/mL   Lab Results  Component Value Date   IRON 70 10/28/2010   TIBC 413 06/13/2010   FERRITIN 7 (L) 06/13/2010    Imaging and Procedures obtained prior to SNF admission: Dg Chest 2 View  Result Date: 06/01/2018 CLINICAL DATA:  Multiple falls EXAM: CHEST - 2 VIEW COMPARISON:  08/16/2017 FINDINGS: Mild cardiomegaly. No acute opacity or pleural effusion. No pneumothorax. Degenerative changes of the spine. IMPRESSION: No active cardiopulmonary disease.  Mild cardiomegaly. Electronically Signed   By: Maudie Mercury  Francoise Ceo M.D.   On: 06/01/2018 18:31   Dg Thoracic Spine 2 View  Result Date: 06/02/2018 CLINICAL DATA:  Multiple falls over the past few days. Confusion. EXAM: THORACIC SPINE 2 VIEWS COMPARISON:  Plain film of the abdomen dated 06/20/2010. Chest x-ray dated 06/01/2018. CT abdomen dated 08/06/2014. FINDINGS: Stable scoliosis of the thoracolumbar spine. No acute or suspicious osseous finding, although characterization is significantly limited by the lack of a lateral view (lateral view decline by the patient). Visualized paravertebral soft tissues are unremarkable. IVC filter in place. IMPRESSION: Study significantly limited by the lack of a lateral view (declined by the patient). Stable scoliosis. Electronically Signed   By: Franki Cabot M.D.   On: 06/02/2018 08:01   Dg Tibia/fibula Right  Result Date: 06/02/2018 CLINICAL DATA:  Fall yesterday with lower leg pain, initial encounter EXAM: RIGHT TIBIA AND FIBULA - 2 VIEW COMPARISON:   None. FINDINGS: Degenerative changes are noted about the knee joint. No acute fracture or dislocation is seen. No gross soft tissue abnormality is noted. IMPRESSION: Chronic changes without acute abnormality. Electronically Signed   By: Inez Catalina M.D.   On: 06/02/2018 14:25   Ct Head Wo Contrast  Result Date: 06/01/2018 CLINICAL DATA:  Multiple recent falls. Reported head injury. History of CVA x2 and colon cancer. EXAM: CT HEAD WITHOUT CONTRAST CT CERVICAL SPINE WITHOUT CONTRAST TECHNIQUE: Multidetector CT imaging of the head and cervical spine was performed following the standard protocol without intravenous contrast. Multiplanar CT image reconstructions of the cervical spine were also generated. COMPARISON:  09/16/2015 head CT. FINDINGS: CT HEAD FINDINGS Brain: No evidence of parenchymal hemorrhage or extra-axial fluid collection. Stable posterior fossa cyst asymmetric to the left along posterior cerebellum compatible with arachnoid cyst. No new mass lesion, mass effect, or midline shift. No CT evidence of acute infarction. Generalized cerebral volume loss. Nonspecific moderate subcortical and periventricular white matter hypodensity, most in keeping with chronic small vessel ischemic change. No ventriculomegaly. Vascular: No acute abnormality. Skull: No evidence of calvarial fracture. Sinuses/Orbits: The visualized paranasal sinuses are essentially clear. Other:  The mastoid air cells are unopacified. CT CERVICAL SPINE FINDINGS Alignment: Exaggerated thoracic kyphosis with normal cervical lordosis. No facet subluxation. Dens is well positioned between the lateral masses of C1. Mild 3 mm anterolisthesis C4-5 and C5-6. Skull base and vertebrae: No acute fracture. No primary bone lesion or focal pathologic process. Soft tissues and spinal canal: No prevertebral edema. No visible canal hematoma. Disc levels: Moderate to severe multilevel degenerative disc disease in the cervical spine, most prominent at  C6-7. Marked bilateral facet arthropathy. Mild degenerative foraminal stenosis bilaterally at C3-4 and C4-5. Upper chest: No acute abnormality. Tiny calcified posterior right upper lobe granuloma. Other: Visualized mastoid air cells appear clear. No discrete thyroid nodules. No pathologically enlarged cervical nodes. IMPRESSION: CT HEAD: 1. No evidence of acute intracranial abnormality. No evidence of calvarial fracture. 2. Generalized cerebral volume loss and moderate chronic small vessel ischemic changes in the cerebral white matter. CT CERVICAL SPINE: 1. No cervical spine fracture or facet subluxation. 2. Moderate to severe multilevel cervical degenerative changes as detailed. Electronically Signed   By: Ilona Sorrel M.D.   On: 06/01/2018 18:37   Ct Cervical Spine Wo Contrast  Result Date: 06/01/2018 CLINICAL DATA:  Multiple recent falls. Reported head injury. History of CVA x2 and colon cancer. EXAM: CT HEAD WITHOUT CONTRAST CT CERVICAL SPINE WITHOUT CONTRAST TECHNIQUE: Multidetector CT imaging of the head and cervical spine was performed following the standard protocol  without intravenous contrast. Multiplanar CT image reconstructions of the cervical spine were also generated. COMPARISON:  09/16/2015 head CT. FINDINGS: CT HEAD FINDINGS Brain: No evidence of parenchymal hemorrhage or extra-axial fluid collection. Stable posterior fossa cyst asymmetric to the left along posterior cerebellum compatible with arachnoid cyst. No new mass lesion, mass effect, or midline shift. No CT evidence of acute infarction. Generalized cerebral volume loss. Nonspecific moderate subcortical and periventricular white matter hypodensity, most in keeping with chronic small vessel ischemic change. No ventriculomegaly. Vascular: No acute abnormality. Skull: No evidence of calvarial fracture. Sinuses/Orbits: The visualized paranasal sinuses are essentially clear. Other:  The mastoid air cells are unopacified. CT CERVICAL SPINE  FINDINGS Alignment: Exaggerated thoracic kyphosis with normal cervical lordosis. No facet subluxation. Dens is well positioned between the lateral masses of C1. Mild 3 mm anterolisthesis C4-5 and C5-6. Skull base and vertebrae: No acute fracture. No primary bone lesion or focal pathologic process. Soft tissues and spinal canal: No prevertebral edema. No visible canal hematoma. Disc levels: Moderate to severe multilevel degenerative disc disease in the cervical spine, most prominent at C6-7. Marked bilateral facet arthropathy. Mild degenerative foraminal stenosis bilaterally at C3-4 and C4-5. Upper chest: No acute abnormality. Tiny calcified posterior right upper lobe granuloma. Other: Visualized mastoid air cells appear clear. No discrete thyroid nodules. No pathologically enlarged cervical nodes. IMPRESSION: CT HEAD: 1. No evidence of acute intracranial abnormality. No evidence of calvarial fracture. 2. Generalized cerebral volume loss and moderate chronic small vessel ischemic changes in the cerebral white matter. CT CERVICAL SPINE: 1. No cervical spine fracture or facet subluxation. 2. Moderate to severe multilevel cervical degenerative changes as detailed. Electronically Signed   By: Ilona Sorrel M.D.   On: 06/01/2018 18:37   Dg Hip Unilat With Pelvis 1v Right  Result Date: 06/02/2018 CLINICAL DATA:  Multiple falls over the past few days. Confusion. EXAM: DG HIP (WITH OR WITHOUT PELVIS) 1V RIGHT COMPARISON:  Plain film of the abdomen dated 06/20/2010. FINDINGS: Single-view of the pelvis and single view of the RIGHT hip are provided. LEFT hip arthroplasty hardware appears intact and stable in position. No acute osseous fracture or dislocation seen. Soft tissues about the pelvis and RIGHT hip are unremarkable. IMPRESSION: No acute findings. Electronically Signed   By: Franki Cabot M.D.   On: 06/02/2018 08:03     Not all labs, radiology exams or other studies done during hospitalization come through on my  EPIC note; however they are reviewed by me.    Assessment and Plan  Right lower extremity cellulitis, possible- Keflex 500 mg 3 times daily x7 days; hopefully in a week when patient's legs are the same color will be able to make a teaching point about cellulitis    Noah Delaine. [name], MD

## 2018-07-14 ENCOUNTER — Other Ambulatory Visit: Payer: Self-pay | Admitting: *Deleted

## 2018-07-14 NOTE — Patient Outreach (Signed)
[facility]) Care Management  07/14/2018  [name] 05/17/1927 [id number]  Facility site visit to [facility] to discuss patient's progress and plan for transitioning to home. Met with [name] SW and discharge planner.  [name] states patient has been working on stairs related to there are several stairs to enter her daughter's home where she resides.  Discharge date is planned for 07/22/18.   Met with patient at bedside.  Patient stated she was wanting to return to her daughter's home soon and feels she has made a lot of progress since being admitted to the facility. Patient stated her daughter provides her transportation and manages her medications.  Explained [employer] Education officer, community.  Patient agreeable to services.   Referral sent for [place] to engage patient in transtion of care at discharge from [facility].   Made [facility] UM Team member aware of [facility] CM referral.   Megan Levene RN, [place] Acute Care Coordinator [phone]) Business Mobile [phone]) Toll free office

## 2018-07-19 ENCOUNTER — Encounter: Payer: Self-pay | Admitting: Internal Medicine

## 2018-07-19 ENCOUNTER — Non-Acute Institutional Stay (SKILLED_NURSING_FACILITY): Payer: Medicare Other | Admitting: Internal Medicine

## 2018-07-19 DIAGNOSIS — N390 Urinary tract infection, site not specified: Secondary | ICD-10-CM | POA: Diagnosis not present

## 2018-07-19 DIAGNOSIS — I4811 Longstanding persistent atrial fibrillation: Secondary | ICD-10-CM | POA: Diagnosis not present

## 2018-07-19 DIAGNOSIS — B9620 Unspecified Escherichia coli [E. coli] as the cause of diseases classified elsewhere: Secondary | ICD-10-CM | POA: Diagnosis not present

## 2018-07-19 DIAGNOSIS — I270 Primary pulmonary hypertension: Secondary | ICD-10-CM | POA: Diagnosis not present

## 2018-07-19 DIAGNOSIS — Z7901 Long term (current) use of anticoagulants: Secondary | ICD-10-CM | POA: Diagnosis not present

## 2018-07-19 DIAGNOSIS — I25118 Atherosclerotic heart disease of native coronary artery with other forms of angina pectoris: Secondary | ICD-10-CM | POA: Diagnosis not present

## 2018-07-19 DIAGNOSIS — I5032 Chronic diastolic (congestive) heart failure: Secondary | ICD-10-CM

## 2018-07-19 DIAGNOSIS — N182 Chronic kidney disease, stage 2 (mild): Secondary | ICD-10-CM | POA: Diagnosis not present

## 2018-07-19 DIAGNOSIS — N179 Acute kidney failure, unspecified: Secondary | ICD-10-CM | POA: Diagnosis not present

## 2018-07-19 DIAGNOSIS — N189 Chronic kidney disease, unspecified: Secondary | ICD-10-CM | POA: Diagnosis not present

## 2018-07-19 DIAGNOSIS — R29898 Other symptoms and signs involving the musculoskeletal system: Secondary | ICD-10-CM

## 2018-07-19 NOTE — Progress Notes (Signed)
ocation:  [facility] [facility] Room Number: 103P Place of Service:  SNF (31)  [name]. Megan Coil, MD  Patient Care Team: Colon Branch, MD as PCP - General (Internal Medicine) Megan Bastos, RN as [place] Management  Extended Emergency Contact Information Primary Emergency Contact: [name] Address: [address]          [place], [place] [address] [name] of [place] Phone: [phone] Mobile Phone: [phone] Relation: Daughter  No Known Allergies  Chief Complaint  Patient presents with  . Discharge Note    Discharge from [facility]    HPI:  85 y.o. female with hypertension, hyperlipidemia, stroke, depression, CKD 3, atrial fib on Coumadin, DVT/PE on Coumadin, thrombocytopenia who presented to [facility] with a fall.  It appeared that her right leg was weaker than it had been and that is been going on for 3 days and she had fallen several times.  Patient was admitted to [facility] from 11/27-12/2 for observation.  Ultimately the patient was at baseline for all of her medical problems except for right leg weakness which resolved during her hospital stay.  Patient had had chest pain earlier but it had resolved.  Patient also had a UTI E. coli, pansensitive treated with Rocephin and amoxicillin and acute kidney injury on chronic kidney disease which is improved with IV fluids.  Patient was admitted to skilled nursing facility for OT/PT and is now ready to be discharged to home.    Past Medical History:  Diagnosis Date  . Acute encephalopathy   . Acute renal failure superimposed on stage 3 chronic kidney disease ([place])   . Anemia   . Atrial fibrillation ([place])   . Colon cancer ([place]) 12/11  . Colon cancer ([place]) 2011  . Diarrhea   . DVT (deep venous thrombosis) ([place]) 11/06  . Elevated brain natriuretic peptide (BNP) level   . Frequent headaches   . Heart murmur   . Hyperkalemia   .  Hypertension   . Pulmonary embolism ([place]) 11/06  . Seizures ([place])   . Stroke [facility]) 11/30/2014    Past Surgical History:  Procedure Laterality Date  . APPENDECTOMY  1952  . COLECTOMY  06/2010   partial, Dr. Donne Hazel complicated by LLE CVT; S/P IVC umbrella & anemia  . HIP FRACTURE SURGERY  2006   Trauma  . TOTAL ABDOMINAL HYSTERECTOMY W/ BILATERAL SALPINGOOPHORECTOMY  1973   For Fibroids  . TOTAL HIP ARTHROPLASTY  01/03/07   Left hip replacement.  Marland Kitchen VENA CAVA FILTER PLACEMENT  06/28/2010     reports that she is a non-smoker but has been exposed to tobacco smoke. She has never used smokeless tobacco. She reports that she does not drink alcohol or use drugs. Social History   Socioeconomic History  . Marital status: Widowed    Spouse name: Not on file  . Number of children: 1  . Years of education: Not on file  . Highest education level: Not on file  Occupational History  . Occupation: n/d    Employer: RETIRED  Social Needs  . Financial resource strain: Not on file  . Food insecurity:    Worry: Not on file    Inability: Not on file  . Transportation needs:    Medical: Not on file    Non-medical: Not on file  Tobacco Use  . Smoking status: Passive Smoke Exposure - Never Smoker  . Smokeless tobacco: Never Used  .  Tobacco comment: husband smoked in home.   Substance and Sexual Activity  . Alcohol use: No    Alcohol/week: 0.0 standard drinks  . Drug use: No  . Sexual activity: Not Currently  Lifestyle  . Physical activity:    Days per week: Not on file    Minutes per session: Not on file  . Stress: Not on file  Relationships  . Social connections:    Talks on phone: Not on file    Gets together: Not on file    Attends religious service: Not on file    Active member of club or organization: Not on file    Attends meetings of clubs or organizations: Not on file    Relationship status: Not on file  . Intimate partner violence:    Fear of current or ex partner: Not  on file    Emotionally abused: Not on file    Physically abused: Not on file    Forced sexual activity: Not on file  Other Topics Concern  . Not on file  Social History Narrative   Lives w/ her daughter     Pertinent  Health Maintenance Due  Topic Date Due  . DEXA SCAN  08/07/2018 (Originally 11/26/1993)  . INFLUENZA VACCINE  Completed  . PNA vac Low Risk Adult  Completed    Medications: Allergies as of 07/19/2018   No Known Allergies     Medication List       Accurate as of July 19, 2018 11:59 PM. Always use your most recent med list.        acetaminophen 500 MG tablet Commonly known as:  TYLENOL Take 1,000 mg by mouth every 8 (eight) hours as needed for headache (pain).   ADVAIR DISKUS 100-50 MCG/DOSE Aepb Generic drug:  Fluticasone-Salmeterol Inhale 1 puff into the lungs 2 (two) times daily.   atorvastatin 20 MG tablet Commonly known as:  LIPITOR Take 1 tablet (20 mg total) by mouth daily.   calcium-vitamin D 500-200 MG-UNIT tablet Commonly known as:  OSCAL WITH D Take 1 tablet by mouth daily with breakfast.   ENSURE Take 237 mLs by mouth daily with breakfast. Milk chocolate   furosemide 80 MG tablet Commonly known as:  LASIX Take 1 tablet (80 mg total) by mouth daily.   isosorbide mononitrate 30 MG 24 hr tablet Commonly known as:  IMDUR Take 0.5 tablets (15 mg total) by mouth daily.   loratadine 10 MG tablet Commonly known as:  CLARITIN Take 1 tablet (10 mg total) by mouth daily as needed (seasonal allergies).   metoprolol tartrate 25 MG tablet Commonly known as:  LOPRESSOR Take 1.5 tablets (37.5 mg total) by mouth 2 (two) times daily.   multivitamin with minerals Tabs tablet Take 1 tablet by mouth daily.   nitroGLYCERIN 0.4 MG SL tablet Commonly known as:  NITROSTAT Place 1 tablet (0.4 mg total) under the tongue every 5 (five) minutes as needed for chest pain.   polyethylene glycol packet Commonly known as:  MIRALAX / GLYCOLAX Take 17 g  by mouth daily as needed.   PROAIR HFA 108 (90 Base) MCG/ACT inhaler Generic drug:  albuterol Inhale 2 puffs into the lungs every 6 (six) hours as needed for wheezing or shortness of breath.   albuterol (2.5 MG/3ML) 0.083% nebulizer solution Commonly known as:  PROVENTIL Take 3 mLs (2.5 mg total) by nebulization 3 (three) times daily as needed for wheezing or shortness of breath.   promethazine 12.5 MG tablet Commonly known  as:  PHENERGAN Take 1 tablet (12.5 mg total) by mouth 3 (three) times daily as needed for nausea or vomiting.   QUEtiapine 25 MG tablet Commonly known as:  SEROQUEL Take 1 tablet (25 mg total) by mouth at bedtime.   traMADol 50 MG tablet Commonly known as:  ULTRAM Take 1 tablet (50 mg total) by mouth every 6 (six) hours as needed for moderate pain.   verapamil 120 MG CR tablet Commonly known as:  CALAN-SR Take 1 tablet (120 mg total) by mouth at bedtime for 30 days.   warfarin 2 MG tablet Commonly known as:  COUMADIN Take as directed by the anticoagulation clinic. If you are unsure how to take this medication, talk to your nurse or doctor. Original instructions:  Take 1 tablet (2 mg total) by mouth daily.        Vitals:   07/19/18 1209  BP: 129/67  Pulse: 75  Resp: 18  Temp: 98 F (36.7 C)  Weight: 188 lb 6.4 oz (85.5 kg)  Height: 5\' 3"  (1.6 m)   Body mass index is 33.37 kg/m.  Physical Exam  GENERAL APPEARANCE: Alert, conversant. No acute distress.  HEENT: Unremarkable. RESPIRATORY: Breathing is even, unlabored. Lung sounds are clear   CARDIOVASCULAR: Heart irregular no murmurs, rubs or gallops. No peripheral edema.  GASTROINTESTINAL: Abdomen is soft, non-tender, not distended w/ normal bowel sounds.  NEUROLOGIC: Cranial nerves 2-12 grossly intact. Moves all extremities   Labs reviewed: Basic Metabolic Panel: Recent Labs    06/03/18 0453 06/04/18 0431 06/05/18 0542 06/13/18 06/27/18  NA 144 144 145 148* 146  K 4.1 4.1 4.4 3.7 4.1    CL 113* 108 112*  --   --   CO2 23 22 24   --   --   GLUCOSE 85 94 90  --   --   BUN 43* 34* 29* 38* 47*  CREATININE 1.70* 1.47* 1.42* 1.1 1.6*  CALCIUM 9.2 9.0 8.8*  --   --    No results found for: [facility] Liver Function Tests: Recent Labs    01/13/18 1620 06/01/18 1726 06/04/18 0731  AST 25 64* 41  ALT 17 23 21   ALKPHOS 49 39 31*  BILITOT 1.1 2.0* 1.2  PROT 6.9 6.7 5.7*  ALBUMIN 4.6 4.5 3.6   No results for input(s): LIPASE, AMYLASE in the last 8760 hours. No results for input(s): AMMONIA in the last 8760 hours. CBC: Recent Labs    08/16/17 1408  06/01/18 1726 06/02/18 0223 06/05/18 0542 06/13/18  WBC 5.3   < > 7.3 5.9 3.8* 3.9  NEUTROABS 3,662  --  5.7  --   --   --   HGB 13.9   < > 13.8 12.3 11.3* 12.0  HCT 40.6   < > 43.5 38.5 36.4 35*  MCV 89.0   < > 98.0 96.5 98.1  --   PLT 92*   < > 63* 58* 56* 84*   < > = values in this interval not displayed.   Lipid Recent Labs    08/16/17 1408  CHOL 97  HDL 37.70*  LDLCALC 48  TRIG 56.0   Cardiac Enzymes: Recent Labs    06/02/18 0223 06/04/18 0731 06/04/18 1428 06/04/18 1818 06/05/18 0030  CKTOTAL 557* 154  --   --   --   TROPONINI  --   --  <0.03 <0.03 <0.03   BNP: No results for input(s): BNP in the last 8760 hours. CBG: Recent Labs    06/03/18  2109 06/04/18 0757 06/04/18 1144  GLUCAP 122* 77 99    Procedures and Imaging Studies During Stay: No results found.  Assessment/Plan:   Coronary artery disease of native artery of native heart with stable angina pectoris ([place])  Long term (current) use of anticoagulants - Plan: warfarin (COUMADIN) 2 MG tablet  Right leg weakness  E. coli UTI  Chronic renal insufficiency, stage II (mild)  Chronic diastolic (congestive) heart failure (HCC)  Acute kidney injury superimposed on chronic kidney disease ([place])  Pulmonary hypertension, primary ([place])  Longstanding persistent atrial fibrillation   Patient is being discharged with the following  home health services: OT/PT  Patient is being discharged with the following durable medical equipment: None  Patient has been advised to f/u with their PCP in 1-2 weeks to bring them up to date on their rehab stay.  Social services at facility was responsible for arranging this appointment.  Pt was provided with a 30 day supply of prescriptions for medications and refills must be obtained from their PCP.  For controlled substances, a more limited supply may be provided adequate until PCP appointment only.  Medications have been reconciled.  Time spent greater than 30 minutes;> 50% of time with patient was spent reviewing records, labs, tests and studies, counseling and developing plan of care  [name]. Megan Coil, MD

## 2018-07-21 ENCOUNTER — Encounter: Payer: Self-pay | Admitting: Internal Medicine

## 2018-07-21 DIAGNOSIS — N179 Acute kidney failure, unspecified: Secondary | ICD-10-CM | POA: Insufficient documentation

## 2018-07-21 DIAGNOSIS — N390 Urinary tract infection, site not specified: Secondary | ICD-10-CM

## 2018-07-21 DIAGNOSIS — R29898 Other symptoms and signs involving the musculoskeletal system: Secondary | ICD-10-CM

## 2018-07-21 DIAGNOSIS — B9620 Unspecified Escherichia coli [E. coli] as the cause of diseases classified elsewhere: Secondary | ICD-10-CM | POA: Insufficient documentation

## 2018-07-21 DIAGNOSIS — N189 Chronic kidney disease, unspecified: Secondary | ICD-10-CM

## 2018-07-21 MED ORDER — POLYETHYLENE GLYCOL 3350 17 G PO PACK: 17.0000 g | PACK | Freq: Every day | ORAL | 0 refills | Status: DC | PRN

## 2018-07-21 MED ORDER — QUETIAPINE FUMARATE 25 MG PO TABS: 25.0000 mg | ORAL_TABLET | Freq: Every day | ORAL | 0 refills | Status: DC

## 2018-07-21 MED ORDER — ISOSORBIDE MONONITRATE ER 30 MG PO TB24: 15.0000 mg | ORAL_TABLET | Freq: Every day | ORAL | 0 refills | Status: DC

## 2018-07-21 MED ORDER — ADVAIR DISKUS 100-50 MCG/DOSE IN AEPB: 1.0000 | INHALATION_SPRAY | Freq: Two times a day (BID) | RESPIRATORY_TRACT | 0 refills | Status: DC

## 2018-07-21 MED ORDER — FUROSEMIDE 80 MG PO TABS: 80.0000 mg | ORAL_TABLET | Freq: Every day | ORAL | 0 refills | Status: DC

## 2018-07-21 MED ORDER — CALCIUM CARBONATE-VITAMIN D 500-200 MG-UNIT PO TABS: 1.0000 | ORAL_TABLET | Freq: Every day | ORAL | 0 refills | Status: DC

## 2018-07-21 MED ORDER — ATORVASTATIN CALCIUM 20 MG PO TABS: 20.0000 mg | ORAL_TABLET | Freq: Every day | ORAL | 0 refills | Status: DC

## 2018-07-21 MED ORDER — PROMETHAZINE HCL 12.5 MG PO TABS: 12.5000 mg | ORAL_TABLET | Freq: Three times a day (TID) | ORAL | 0 refills | Status: DC | PRN

## 2018-07-21 MED ORDER — ALBUTEROL SULFATE (2.5 MG/3ML) 0.083% IN NEBU: 2.5000 mg | INHALATION_SOLUTION | Freq: Three times a day (TID) | RESPIRATORY_TRACT | 0 refills | Status: DC | PRN

## 2018-07-21 MED ORDER — METOPROLOL TARTRATE 25 MG PO TABS: 37.5000 mg | ORAL_TABLET | Freq: Two times a day (BID) | ORAL | 0 refills | Status: DC

## 2018-07-21 MED ORDER — PROAIR HFA 108 (90 BASE) MCG/ACT IN AERS: 2.0000 | INHALATION_SPRAY | Freq: Four times a day (QID) | RESPIRATORY_TRACT | 0 refills | Status: DC | PRN

## 2018-07-21 MED ORDER — WARFARIN SODIUM 2 MG PO TABS: 2.0000 mg | ORAL_TABLET | Freq: Every day | ORAL | 0 refills | Status: DC

## 2018-07-21 MED ORDER — LORATADINE 10 MG PO TABS: 10.0000 mg | ORAL_TABLET | Freq: Every day | ORAL | 0 refills | Status: DC | PRN

## 2018-07-21 MED ORDER — TRAMADOL HCL 50 MG PO TABS: 50.0000 mg | ORAL_TABLET | Freq: Four times a day (QID) | ORAL | 0 refills | Status: DC | PRN

## 2018-07-21 MED ORDER — NITROGLYCERIN 0.4 MG SL SUBL: 0.4000 mg | SUBLINGUAL_TABLET | SUBLINGUAL | 0 refills | Status: DC | PRN

## 2018-07-21 MED ORDER — VERAPAMIL HCL ER 120 MG PO TBCR: 120.0000 mg | EXTENDED_RELEASE_TABLET | Freq: Every day | ORAL | 0 refills | Status: DC

## 2018-07-22 ENCOUNTER — Other Ambulatory Visit: Payer: Self-pay

## 2018-07-22 ENCOUNTER — Ambulatory Visit (INDEPENDENT_AMBULATORY_CARE_PROVIDER_SITE_OTHER): Payer: Medicare Other | Admitting: Internal Medicine

## 2018-07-22 ENCOUNTER — Encounter: Payer: Self-pay | Admitting: Internal Medicine

## 2018-07-22 VITALS — BP 149/76 | HR 79 | Resp 17 | Ht 63.0 in

## 2018-07-22 DIAGNOSIS — I4891 Unspecified atrial fibrillation: Secondary | ICD-10-CM | POA: Diagnosis not present

## 2018-07-22 DIAGNOSIS — I25118 Atherosclerotic heart disease of native coronary artery with other forms of angina pectoris: Secondary | ICD-10-CM

## 2018-07-22 DIAGNOSIS — I10 Essential (primary) hypertension: Secondary | ICD-10-CM

## 2018-07-22 DIAGNOSIS — Z7901 Long term (current) use of anticoagulants: Secondary | ICD-10-CM | POA: Diagnosis not present

## 2018-07-22 DIAGNOSIS — R410 Disorientation, unspecified: Secondary | ICD-10-CM

## 2018-07-22 DIAGNOSIS — I159 Secondary hypertension, unspecified: Secondary | ICD-10-CM | POA: Diagnosis not present

## 2018-07-22 DIAGNOSIS — D696 Thrombocytopenia, unspecified: Secondary | ICD-10-CM | POA: Diagnosis not present

## 2018-07-22 MED ORDER — CEPHALEXIN 500 MG PO CAPS: 500.0000 mg | ORAL_CAPSULE | Freq: Four times a day (QID) | ORAL | 0 refills | Status: DC

## 2018-07-22 NOTE — Progress Notes (Signed)
Subjective:    Patient ID: [name], female    DOB: 12/16/1926, 85 y.o.   MRN: [id number]  DOS:  07/22/2018 Type of visit - description: Follow-up  Admitted to the hospital 06/01/2017, discharge few days later to SNF. She was admitted after a fall, had multiple x-rays and CTs, no acute findings. Additionally, have chest pain, troponins were negative, was recommended Imdur in addition to the routine medicines. UTI, E. coli, status post antibiotics.  At the SNF, he was seen by Dr. [name], she had severe right leg swelling, reportedly ultrasound was negative for DVT and she actually was diagnosed with right lower extremity cellulitis and prescribed Keflex on 07/13/2018. Now improve, redness decreased, swelling decreased.  Review of Systems No fever chills Appetite is good Had mild dysuria a few days ago, better today. Occasional cough and mild wheezing. Hardly ever takes tramadol.   Past Medical History:  Diagnosis Date  . Acute encephalopathy   . Acute renal failure superimposed on stage 3 chronic kidney disease ([place])   . Anemia   . Atrial fibrillation ([place])   . Colon cancer ([place]) 12/11  . Colon cancer ([place]) 2011  . Diarrhea   . DVT (deep venous thrombosis) ([place]) 11/06  . Elevated brain natriuretic peptide (BNP) level   . Frequent headaches   . Heart murmur   . Hyperkalemia   . Hypertension   . Pulmonary embolism ([place]) 11/06  . Seizures ([place])   . Stroke [facility]) 11/30/2014    Past Surgical History:  Procedure Laterality Date  . APPENDECTOMY  1952  . COLECTOMY  06/2010   partial, Dr. Donne Hazel complicated by LLE CVT; S/P IVC umbrella & anemia  . HIP FRACTURE SURGERY  2006   Trauma  . TOTAL ABDOMINAL HYSTERECTOMY W/ BILATERAL SALPINGOOPHORECTOMY  1973   For Fibroids  . TOTAL HIP ARTHROPLASTY  01/03/07   Left hip replacement.  Marland Kitchen VENA CAVA FILTER PLACEMENT  06/28/2010    Social History   Socioeconomic History  . Marital status: Widowed    Spouse name: Not on  file  . Number of children: 1  . Years of education: Not on file  . Highest education level: Not on file  Occupational History  . Occupation: n/d    Employer: RETIRED  Social Needs  . Financial resource strain: Not on file  . Food insecurity:    Worry: Not on file    Inability: Not on file  . Transportation needs:    Medical: Not on file    Non-medical: Not on file  Tobacco Use  . Smoking status: Passive Smoke Exposure - Never Smoker  . Smokeless tobacco: Never Used  . Tobacco comment: husband smoked in home.   Substance and Sexual Activity  . Alcohol use: No    Alcohol/week: 0.0 standard drinks  . Drug use: No  . Sexual activity: Not Currently  Lifestyle  . Physical activity:    Days per week: Not on file    Minutes per session: Not on file  . Stress: Not on file  Relationships  . Social connections:    Talks on phone: Not on file    Gets together: Not on file    Attends religious service: Not on file    Active member of club or organization: Not on file    Attends meetings of clubs or organizations: Not on file    Relationship status: Not on file  . Intimate partner violence:    Fear of current or ex  partner: Not on file    Emotionally abused: Not on file    Physically abused: Not on file    Forced sexual activity: Not on file  Other Topics Concern  . Not on file  Social History Narrative   Lives w/ her daughter       Allergies as of 07/22/2018   No Known Allergies     Medication List       Accurate as of July 22, 2018 11:59 PM. Always use your most recent med list.        acetaminophen 500 MG tablet Commonly known as:  TYLENOL Take 1,000 mg by mouth every 8 (eight) hours as needed for headache (pain).   ADVAIR DISKUS 100-50 MCG/DOSE Aepb Generic drug:  Fluticasone-Salmeterol Inhale 1 puff into the lungs 2 (two) times daily.   atorvastatin 20 MG tablet Commonly known as:  LIPITOR Take 1 tablet (20 mg total) by mouth daily.   calcium-vitamin  D 500-200 MG-UNIT tablet Commonly known as:  OSCAL WITH D Take 1 tablet by mouth daily with breakfast.   cephALEXin 500 MG capsule Commonly known as:  KEFLEX Take 1 capsule (500 mg total) by mouth 4 (four) times daily.   ENSURE Take 237 mLs by mouth daily with breakfast. Milk chocolate   furosemide 80 MG tablet Commonly known as:  LASIX Take 1 tablet (80 mg total) by mouth daily.   isosorbide mononitrate 30 MG 24 hr tablet Commonly known as:  IMDUR Take 0.5 tablets (15 mg total) by mouth daily.   loratadine 10 MG tablet Commonly known as:  CLARITIN Take 1 tablet (10 mg total) by mouth daily as needed (seasonal allergies).   metoprolol tartrate 25 MG tablet Commonly known as:  LOPRESSOR Take 1.5 tablets (37.5 mg total) by mouth 2 (two) times daily.   multivitamin with minerals Tabs tablet Take 1 tablet by mouth daily.   nitroGLYCERIN 0.4 MG SL tablet Commonly known as:  NITROSTAT Place 1 tablet (0.4 mg total) under the tongue every 5 (five) minutes as needed for chest pain.   polyethylene glycol packet Commonly known as:  MIRALAX / GLYCOLAX Take 17 g by mouth daily as needed.   PROAIR HFA 108 (90 Base) MCG/ACT inhaler Generic drug:  albuterol Inhale 2 puffs into the lungs every 6 (six) hours as needed for wheezing or shortness of breath.   albuterol (2.5 MG/3ML) 0.083% nebulizer solution Commonly known as:  PROVENTIL Take 3 mLs (2.5 mg total) by nebulization 3 (three) times daily as needed for wheezing or shortness of breath.   promethazine 12.5 MG tablet Commonly known as:  PHENERGAN Take 1 tablet (12.5 mg total) by mouth 3 (three) times daily as needed for nausea or vomiting.   QUEtiapine 25 MG tablet Commonly known as:  SEROQUEL Take 1 tablet (25 mg total) by mouth at bedtime.   traMADol 50 MG tablet Commonly known as:  ULTRAM Take 1 tablet (50 mg total) by mouth every 6 (six) hours as needed for moderate pain.   verapamil 120 MG CR tablet Commonly known  as:  CALAN-SR Take 1 tablet (120 mg total) by mouth at bedtime for 30 days.   warfarin 2 MG tablet Commonly known as:  COUMADIN Take as directed by the anticoagulation clinic. If you are unsure how to take this medication, talk to your nurse or doctor. Original instructions:  Take 1 tablet (2 mg total) by mouth daily.           Objective:  Physical Exam BP (!) 149/76   Pulse 79   Resp 17   Ht 5\' 3"  (1.6 m)   SpO2 94%   BMI 33.37 kg/m  General:   Well developed, NAD, sitting in a wheelchair in no distress, BMI noted. HEENT:  Normocephalic . Face symmetric, atraumatic Lungs:  Very few end expiratory wheezes Normal respiratory effort, no intercostal retractions, no accessory muscle use. Heart: Irregularly irregular,  no murmur.  Right calf is almost 1 inch larger in circumference compared to the left. + Pitting edema more noticeable on the right. Skin: Very faint redness at the right pretibial area with no warmness. Neurologic:  alert & oriented x3  Speech normal, gait not tested. Psych--  Cognition and judgment appear intact.  Cooperative with normal attention span and concentration.  Behavior appropriate. No anxious or depressed appearing.      Assessment    Assessment (transfer Dr Linna Darner 04-2015) HTN Hyperlipidemia insomnia - xanax prn Asthma  Atrial fibrillation--rate controlled, anticoag Neuro --Stroke 11-2014, no residual deficits (TIA?) --Confusion, hallucinations, rx Seroquel 03-2018 pere pcp Heart murmur: Mitral regurgitation DVT and pulmonary emboli 2006, vena cava filter 2011  MSK: tramadol qd prn --DJD --Back pain has seen Dr Nelva Bush, dr Rolena Infante, MRI: djd, stenosis, med treatment Colon cancer 2011 -- Dr Annitta Jersey , d/c from oncology 06-2015 , No further colonoscopies Thrombocytopenia: Mild, fluctuating   PLAN Status post a fall: Was admitted to the hospital, no broken bones, subsequently went to SNF, had intensive PT, doing much better, uses a walker.   Was d/c from the SNF today and going home. HTN: Seems controlled on Lasix, Imdur, metoprolol, calan .  Check a CMP and CBC. Asthma: Continue inhalers Atrial fibrillation: On Coumadin 2 mg daily, last INR not therapeutic, rechecking it today. Cellulitis, right leg/edema: Recently diagnosed at the SNF, patient's daughter reports a negative ultrasound for DVT, had 1 week of antibiotics, currently better but not completely well.  With extended treatment with Keflex 5 more days. Also, due to edema, Lasix dose increased, good compliance and tolerance.  Checking electrolytes daily. Edema: As above Confusion/hallucinations: Since the last visit, I started Seroquel 03/2018, subsequently saw neurology, they recommended an EEG and MRI but to my knowledge that was normal pursued because the patient was in the hospital.  They were warned by neurology about the potential side effects of Seroquel.  At this point she is doing well.  No change.  DJD: Hardly ever uses tramadol UTI: Diagnosed at the hospital and treated. RTC 4 weeks

## 2018-07-22 NOTE — Patient Instructions (Signed)
GO TO THE LAB : Get the blood work     GO TO THE FRONT DESK Schedule your next appointment   Continue same medications  Work on a restart the antibiotic called Keflex for 5 days  Keep your leg elevated

## 2018-07-23 LAB — CBC WITH DIFFERENTIAL/PLATELET
Absolute Monocytes: 556 cells/uL (ref 200–950)
Basophils Absolute: 22 cells/uL (ref 0–200)
Basophils Relative: 0.4 %
Eosinophils Absolute: 130 cells/uL (ref 15–500)
Eosinophils Relative: 2.4 %
HCT: 37.1 % (ref 35.0–45.0)
Hemoglobin: 12.5 g/dL (ref 11.7–15.5)
Lymphs Abs: 1048 cells/uL (ref 850–3900)
MCH: 31.5 pg (ref 27.0–33.0)
MCHC: 33.7 g/dL (ref 32.0–36.0)
MCV: 93.5 fL (ref 80.0–100.0)
MPV: 12.8 fL — ABNORMAL HIGH (ref 7.5–12.5)
Monocytes Relative: 10.3 %
Neutro Abs: 3645 cells/uL (ref 1500–7800)
Neutrophils Relative %: 67.5 %
Platelets: 71 10*3/uL — ABNORMAL LOW (ref 140–400)
RBC: 3.97 10*6/uL (ref 3.80–5.10)
RDW: 12.6 % (ref 11.0–15.0)
TOTAL LYMPHOCYTE: 19.4 %
WBC: 5.4 10*3/uL (ref 3.8–10.8)

## 2018-07-23 LAB — COMPREHENSIVE METABOLIC PANEL
AG Ratio: 2.2 (calc) (ref 1.0–2.5)
ALT: 12 U/L (ref 6–29)
AST: 19 U/L (ref 10–35)
Albumin: 4.4 g/dL (ref 3.6–5.1)
Alkaline phosphatase (APISO): 63 U/L (ref 33–130)
BUN/Creatinine Ratio: 29 (calc) — ABNORMAL HIGH (ref 6–22)
BUN: 44 mg/dL — ABNORMAL HIGH (ref 7–25)
CHLORIDE: 107 mmol/L (ref 98–110)
CO2: 27 mmol/L (ref 20–32)
Calcium: 9.7 mg/dL (ref 8.6–10.4)
Creat: 1.51 mg/dL — ABNORMAL HIGH (ref 0.60–0.88)
Globulin: 2 g/dL (calc) (ref 1.9–3.7)
Glucose, Bld: 96 mg/dL (ref 65–99)
Potassium: 4 mmol/L (ref 3.5–5.3)
Sodium: 148 mmol/L — ABNORMAL HIGH (ref 135–146)
Total Bilirubin: 1.6 mg/dL — ABNORMAL HIGH (ref 0.2–1.2)
Total Protein: 6.4 g/dL (ref 6.1–8.1)

## 2018-07-23 LAB — PROTIME-INR
INR: 1.7 — ABNORMAL HIGH
Prothrombin Time: 17.3 s — ABNORMAL HIGH (ref 9.0–11.5)

## 2018-07-24 NOTE — Assessment & Plan Note (Signed)
status post a fall: Was admitted to the hospital, no broken bones, subsequently went to SNF, had intensive PT, doing much better, uses a walker.  Was d/c from the SNF today and going home. HTN: Seems controlled on Lasix, Imdur, metoprolol, calan .  Check a CMP and CBC. Asthma: Continue inhalers Atrial fibrillation: On Coumadin 2 mg daily, last INR not therapeutic, rechecking it today. Cellulitis, right leg/edema: Recently diagnosed at the SNF, patient's daughter reports a negative ultrasound for DVT, had 1 week of antibiotics, currently better but not completely well.  With extended treatment with Keflex 5 more days. Also, due to edema, Lasix dose increased, good compliance and tolerance.  Checking electrolytes daily. Edema: As above Confusion/hallucinations: Since the last visit, I started Seroquel 03/2018, subsequently saw neurology, they recommended an EEG and MRI but to my knowledge that was normal pursued because the patient was in the hospital.  They were warned by neurology about the potential side effects of Seroquel.  At this point she is doing well.  No change.  DJD: Hardly ever uses tramadol UTI: Diagnosed at the hospital and treated. RTC 4 weeks

## 2018-07-25 ENCOUNTER — Other Ambulatory Visit: Payer: Self-pay | Admitting: *Deleted

## 2018-07-25 NOTE — Patient Outreach (Signed)
[facility]) Care Management  07/25/2018  [place] [birth year] [id number]   Post d/c SNF for Transition of care   RN spoke with pt and received permission to speak with her daughter [name]. RN introduced [facility] program and available services. Further discussed pt's recent discharged from the SNF and possible needs. [name] confirmed medication when reviewed and RN able to completed the transition of care template. Caregiver indicated pt has already visited her primary provided upon discharge from the facility. RN discussed any barriers or needs at this time and explained [facility] can follow pt for transition of care calls over the next few months however daughter feels she is caring for the pt's needs at this time with no needs. RN explained other services with [facility] for pharmacy or community resources with a social worker if need however declined at this time with no further needs. RN provided RN contact name and number if services may benefit pt at a later date. Daughter very appreciative and grateful for the call. RN has informed the caregiver that the pt's provider will be notified of her disposition with [facility] services. No further needs as this care will be closed.  Patient was recently discharged from hospital and all medications have been reviewed.  Raina Mina, RN Care Management Coordinator [place] Office [phone]

## 2018-07-25 NOTE — Addendum Note (Signed)
Addended byDamita Dunnings D on: 07/25/2018 08:18 AM   Modules accepted: Orders

## 2018-07-27 ENCOUNTER — Other Ambulatory Visit: Payer: Self-pay | Admitting: Internal Medicine

## 2018-07-28 ENCOUNTER — Encounter: Payer: Self-pay | Admitting: Internal Medicine

## 2018-07-28 MED ORDER — FLUTICASONE-SALMETEROL 100-50 MCG/DOSE IN AEPB: 1.0000 | INHALATION_SPRAY | Freq: Two times a day (BID) | RESPIRATORY_TRACT | 12 refills | Status: DC

## 2018-08-17 ENCOUNTER — Ambulatory Visit: Payer: Medicare Other | Admitting: Internal Medicine

## 2018-08-19 ENCOUNTER — Ambulatory Visit: Payer: Medicare Other | Admitting: Internal Medicine

## 2018-08-19 ENCOUNTER — Other Ambulatory Visit: Payer: Self-pay | Admitting: Internal Medicine

## 2018-08-22 ENCOUNTER — Encounter: Payer: Self-pay | Admitting: Internal Medicine

## 2018-08-22 ENCOUNTER — Ambulatory Visit (INDEPENDENT_AMBULATORY_CARE_PROVIDER_SITE_OTHER): Payer: Medicare Other | Admitting: Internal Medicine

## 2018-08-22 VITALS — BP 122/62 | HR 65 | Resp 18 | Ht 63.0 in

## 2018-08-22 DIAGNOSIS — N390 Urinary tract infection, site not specified: Secondary | ICD-10-CM | POA: Diagnosis not present

## 2018-08-22 DIAGNOSIS — E569 Vitamin deficiency, unspecified: Secondary | ICD-10-CM

## 2018-08-22 DIAGNOSIS — B9620 Unspecified Escherichia coli [E. coli] as the cause of diseases classified elsewhere: Secondary | ICD-10-CM | POA: Diagnosis not present

## 2018-08-22 DIAGNOSIS — I4811 Longstanding persistent atrial fibrillation: Secondary | ICD-10-CM | POA: Diagnosis not present

## 2018-08-22 DIAGNOSIS — R443 Hallucinations, unspecified: Secondary | ICD-10-CM

## 2018-08-22 DIAGNOSIS — Z7901 Long term (current) use of anticoagulants: Secondary | ICD-10-CM

## 2018-08-22 DIAGNOSIS — I10 Essential (primary) hypertension: Secondary | ICD-10-CM | POA: Diagnosis not present

## 2018-08-22 DIAGNOSIS — M549 Dorsalgia, unspecified: Secondary | ICD-10-CM

## 2018-08-22 DIAGNOSIS — I25118 Atherosclerotic heart disease of native coronary artery with other forms of angina pectoris: Secondary | ICD-10-CM

## 2018-08-22 DIAGNOSIS — G8929 Other chronic pain: Secondary | ICD-10-CM

## 2018-08-22 LAB — CBC WITH DIFFERENTIAL/PLATELET
BASOS ABS: 0.1 10*3/uL (ref 0.0–0.1)
Basophils Relative: 1.1 % (ref 0.0–3.0)
EOS PCT: 8.4 % — AB (ref 0.0–5.0)
Eosinophils Absolute: 0.5 10*3/uL (ref 0.0–0.7)
HCT: 41.4 % (ref 36.0–46.0)
Hemoglobin: 13.8 g/dL (ref 12.0–15.0)
Lymphocytes Relative: 18.1 % (ref 12.0–46.0)
Lymphs Abs: 1 10*3/uL (ref 0.7–4.0)
MCHC: 33.4 g/dL (ref 30.0–36.0)
MCV: 92.2 fl (ref 78.0–100.0)
Monocytes Absolute: 0.5 10*3/uL (ref 0.1–1.0)
Monocytes Relative: 8.8 % (ref 3.0–12.0)
NEUTROS ABS: 3.6 10*3/uL (ref 1.4–7.7)
Neutrophils Relative %: 63.6 % (ref 43.0–77.0)
Platelets: 85 10*3/uL — ABNORMAL LOW (ref 150.0–400.0)
RBC: 4.49 Mil/uL (ref 3.87–5.11)
RDW: 14.3 % (ref 11.5–15.5)
WBC: 5.7 10*3/uL (ref 4.0–10.5)

## 2018-08-22 LAB — URINALYSIS, ROUTINE W REFLEX MICROSCOPIC
Bilirubin Urine: NEGATIVE
Ketones, ur: NEGATIVE
Nitrite: POSITIVE — AB
Total Protein, Urine: NEGATIVE
Urine Glucose: NEGATIVE
Urobilinogen, UA: 0.2 (ref 0.0–1.0)
pH: 7 (ref 5.0–8.0)

## 2018-08-22 LAB — BASIC METABOLIC PANEL
BUN: 46 mg/dL — ABNORMAL HIGH (ref 6–23)
CO2: 31 mEq/L (ref 19–32)
Calcium: 10.5 mg/dL (ref 8.4–10.5)
Chloride: 105 mEq/L (ref 96–112)
Creatinine, Ser: 1.94 mg/dL — ABNORMAL HIGH (ref 0.40–1.20)
GFR: 24.26 mL/min — AB (ref >60.00)
Glucose, Bld: 83 mg/dL (ref 70–99)
Potassium: 4.2 mEq/L (ref 3.5–5.1)
Sodium: 147 mEq/L — ABNORMAL HIGH (ref 135–145)

## 2018-08-22 LAB — POCT INR: INR: 2.5 (ref 2.0–3.0)

## 2018-08-22 MED ORDER — VERAPAMIL HCL ER 120 MG PO TBCR: 120.0000 mg | EXTENDED_RELEASE_TABLET | Freq: Every day | ORAL | 3 refills | Status: DC

## 2018-08-22 MED ORDER — ISOSORBIDE MONONITRATE ER 30 MG PO TB24: 15.0000 mg | ORAL_TABLET | Freq: Every day | ORAL | 3 refills | Status: DC

## 2018-08-22 NOTE — Progress Notes (Signed)
Subjective:    Patient ID: [name], female    DOB: 1926/07/22, 85 y.o.   MRN: [id number]  DOS:  08/22/2018 Type of visit - description: Follow-up, here with her daughter. Needs a Coumadin check. History of falls, no further events Pain well controlled with occasional tramadol The main concern today is that she is extremely sleepy throughout the day. The daughter suspect that it might be from Seroquel, since she left the nursing home she is receiving it in the morning.  Review of Systems No vomiting, diarrhea or blood in the stools.  Occasionally has nausea which has been a long-term issue No memory issues, no depression Occasional dysuria.  No gross hematuria or difficulty urinating. Occasional headache, not a new issue.   Past Medical History:  Diagnosis Date  . Acute encephalopathy   . Acute renal failure superimposed on stage 3 chronic kidney disease ([place])   . Anemia   . Atrial fibrillation ([place])   . Colon cancer ([place]) 12/11  . Colon cancer ([place]) 2011  . Diarrhea   . DVT (deep venous thrombosis) ([place]) 11/06  . Elevated brain natriuretic peptide (BNP) level   . Frequent headaches   . Heart murmur   . Hyperkalemia   . Hypertension   . Pulmonary embolism ([place]) 11/06  . Seizures ([place])   . Stroke [facility]) 11/30/2014    Past Surgical History:  Procedure Laterality Date  . APPENDECTOMY  1952  . COLECTOMY  06/2010   partial, Dr. Donne Hazel complicated by LLE CVT; S/P IVC umbrella & anemia  . HIP FRACTURE SURGERY  2006   Trauma  . TOTAL ABDOMINAL HYSTERECTOMY W/ BILATERAL SALPINGOOPHORECTOMY  1973   For Fibroids  . TOTAL HIP ARTHROPLASTY  01/03/07   Left hip replacement.  Marland Kitchen VENA CAVA FILTER PLACEMENT  06/28/2010    Social History   Socioeconomic History  . Marital status: Widowed    Spouse name: Not on file  . Number of children: 1  . Years of education: Not on file  . Highest education level: Not on file  Occupational History  . Occupation: n/d    Employer:  RETIRED  Social Needs  . Financial resource strain: Not on file  . Food insecurity:    Worry: Not on file    Inability: Not on file  . Transportation needs:    Medical: Not on file    Non-medical: Not on file  Tobacco Use  . Smoking status: Passive Smoke Exposure - Never Smoker  . Smokeless tobacco: Never Used  . Tobacco comment: husband smoked in home.   Substance and Sexual Activity  . Alcohol use: No    Alcohol/week: 0.0 standard drinks  . Drug use: No  . Sexual activity: Not Currently  Lifestyle  . Physical activity:    Days per week: Not on file    Minutes per session: Not on file  . Stress: Not on file  Relationships  . Social connections:    Talks on phone: Not on file    Gets together: Not on file    Attends religious service: Not on file    Active member of club or organization: Not on file    Attends meetings of clubs or organizations: Not on file    Relationship status: Not on file  . Intimate partner violence:    Fear of current or ex partner: Not on file    Emotionally abused: Not on file    Physically abused: Not on file  Forced sexual activity: Not on file  Other Topics Concern  . Not on file  Social History Narrative   Lives w/ her daughter       Allergies as of 08/22/2018   No Known Allergies     Medication List       Accurate as of August 22, 2018  1:31 PM. Always use your most recent med list.        acetaminophen 500 MG tablet Commonly known as:  TYLENOL Take 1,000 mg by mouth every 8 (eight) hours as needed for headache (pain).   albuterol (2.5 MG/3ML) 0.083% nebulizer solution Commonly known as:  PROVENTIL Take 3 mLs (2.5 mg total) by nebulization 3 (three) times daily as needed for wheezing or shortness of breath.   atorvastatin 20 MG tablet Commonly known as:  LIPITOR Take 1 tablet (20 mg total) by mouth daily.   calcium-vitamin D 500-200 MG-UNIT tablet Commonly known as:  OSCAL WITH D Take 1 tablet by mouth daily with  breakfast.   cephALEXin 500 MG capsule Commonly known as:  KEFLEX Take 1 capsule (500 mg total) by mouth 4 (four) times daily.   ENSURE Take 237 mLs by mouth daily with breakfast. Milk chocolate   Fluticasone-Salmeterol 100-50 MCG/DOSE Aepb Commonly known as:  WIXELA INHUB Inhale 1 puff into the lungs 2 (two) times daily.   furosemide 80 MG tablet Commonly known as:  LASIX Take 1 tablet (80 mg total) by mouth daily.   isosorbide mononitrate 30 MG 24 hr tablet Commonly known as:  IMDUR Take 0.5 tablets (15 mg total) by mouth daily.   loratadine 10 MG tablet Commonly known as:  CLARITIN Take 1 tablet (10 mg total) by mouth daily as needed (seasonal allergies).   metoprolol tartrate 25 MG tablet Commonly known as:  LOPRESSOR Take 1.5 tablets (37.5 mg total) by mouth 2 (two) times daily.   multivitamin with minerals Tabs tablet Take 1 tablet by mouth daily.   nitroGLYCERIN 0.4 MG SL tablet Commonly known as:  NITROSTAT Place 1 tablet (0.4 mg total) under the tongue every 5 (five) minutes as needed for chest pain.   polyethylene glycol packet Commonly known as:  MIRALAX / GLYCOLAX Take 17 g by mouth daily as needed.   promethazine 12.5 MG tablet Commonly known as:  PHENERGAN Take 1 tablet (12.5 mg total) by mouth 3 (three) times daily as needed for nausea or vomiting.   QUEtiapine 25 MG tablet Commonly known as:  SEROQUEL Take 1 tablet (25 mg total) by mouth at bedtime.   traMADol 50 MG tablet Commonly known as:  ULTRAM Take 1 tablet (50 mg total) by mouth every 6 (six) hours as needed for moderate pain.   verapamil 120 MG CR tablet Commonly known as:  CALAN-SR Take 1 tablet (120 mg total) by mouth at bedtime for 30 days.   warfarin 2 MG tablet Commonly known as:  COUMADIN Take as directed by the anticoagulation clinic. If you are unsure how to take this medication, talk to your nurse or doctor. Original instructions:  Take 1 tablet (2 mg total) by mouth daily.    warfarin 2.5 MG tablet Commonly known as:  COUMADIN Take as directed by the anticoagulation clinic. If you are unsure how to take this medication, talk to your nurse or doctor. Original instructions:  USE AS DIRECTED BY COUMADIN CLINIC           Objective:   Physical Exam BP 122/62 (BP Location: Left Arm, Patient Position:  Sitting, Cuff Size: Normal)   Pulse 65   Resp 18   Ht 5\' 3"  (1.6 m)   SpO2 96%   BMI 33.37 kg/m  General:   Well developed, NAD, BMI noted.  Does not seem sleepy today, at baseline HEENT:  Normocephalic . Face symmetric, atraumatic Lungs:  CTA B Normal respiratory effort, no intercostal retractions, no accessory muscle use. Heart: Irregularly irregular,  no murmur.  No pretibial edema bilaterally  Skin: Not pale. Not jaundice Neurologic:  alert & oriented X3.  Speech normal, gait not tested, sitting in a wheelchair Psych--  Cognition and judgment appear intact.  Cooperative with normal attention span and concentration.  Behavior appropriate. No anxious or depressed appearing.      Assessment     Assessment (transfer Dr Linna Darner 04-2015) HTN Hyperlipidemia insomnia - xanax prn Asthma  Atrial fibrillation--rate controlled, anticoag Neuro --Stroke 11-2014, no residual deficits (TIA?) --Confusion, hallucinations, rx Seroquel 03-2018 [facility] Heart murmur: Mitral regurgitation DVT and pulmonary emboli 2006, vena cava filter 2011  MSK: tramadol qd prn --DJD --Back pain has seen Dr Nelva Bush, dr Rolena Infante, MRI: djd, stenosis, med treatment Colon cancer 2011 -- Dr Annitta Jersey , d/c from oncology 06-2015 , No further colonoscopies Thrombocytopenia: Mild, fluctuating   PLAN HTN: Does not seem to be or overcontrolled. We will continue with Lasix, metoprolol, Calan SR, she is also on Imdur.  We are decreasing Imdur dose, see below. Atrial fibrillation: Continue Coumadin, taking 2.5 mg daily, INR 2.5.  No change, rechecking in 4  weeks. DJD, chronic back pain:  Controlled at this time, takes Ultram rarely. Fatigue, feeling sleepy: The patient's daughter has noted a change over the last several weeks, it is not mental status changes, memory is okay.  Is the fact that she is extremely sleepy throughout the day even more so than during the night. After we talk, we agreed that potentially culprits are taking Seroquel in the mornings so we will change it to nighttime. Was rx imdur 30 mg  1/2 po qd but has been on 1 po qd; will decrease to 1/2 po qd  Will  check her blood work today including a BMP, CBC, TSH; r/o vit deficiencies, check N27, folic acid, vitamin D.  Also a UA and urine culture to rule out an occult infection. Reassess in few weeks, call if something changes. Imdur: Was prescribed at the hospital 06/2018, at the time she had chest pain and received Imdur empirically.consider d/c it  Confusions, hallucinations: On Seroquel, no further confusion, occasionally has vivid dreams.  Consider decrease Seroquel dose in the near future. RTC 4 weeks INR RTC 8 weeks check

## 2018-08-22 NOTE — Patient Instructions (Addendum)
Please schedule Medicare Wellness with Glenard Haring.   GO TO THE LAB : Get the blood work     GO TO THE FRONT DESK  Schedule your next appointment   a Coumadin check in 1 month  Routine visit in 2 months    Be sure she takes Imdur: Half tablet daily  Move Seroquel to nighttime.  Consider decrease to half tablet if she continue to be sleepy

## 2018-08-22 NOTE — Progress Notes (Signed)
Pre visit review using our clinic review tool, if applicable. No additional management support is needed unless otherwise documented below in the visit note. 

## 2018-08-23 NOTE — Assessment & Plan Note (Addendum)
HTN: Does not seem to be or overcontrolled. We will continue with Lasix, metoprolol, Calan SR, she is also on Imdur.  We are decreasing Imdur dose, see below. Atrial fibrillation: Continue Coumadin, taking 2.5 mg daily, INR 2.5.  No change, rechecking in 4  weeks. DJD, chronic back pain: Controlled at this time, takes Ultram rarely. Fatigue, feeling sleepy: The patient's daughter has noted a change over the last several weeks, it is not mental status changes, memory is okay.  Is the fact that she is extremely sleepy throughout the day even more so than during the night. After we talk, we agreed that potentially culprits are taking Seroquel in the mornings so we will change it to nighttime. Was rx imdur 30 mg  1/2 po qd but has been on 1 po qd; will decrease to 1/2 po qd  Will  check her blood work today including a BMP, CBC, TSH; r/o vit deficiencies, check D40, folic acid, vitamin D.  Also a UA and urine culture to rule out an occult infection. Reassess in few weeks, call if something changes. Imdur: Was prescribed at the hospital 06/2018, at the time she had chest pain and received Imdur empirically.consider d/c it  Confusions, hallucinations: On Seroquel, no further confusion, occasionally has vivid dreams.  Consider decrease Seroquel dose in the near future. RTC 4 weeks INR RTC 8 weeks check

## 2018-08-24 LAB — URINE CULTURE
MICRO NUMBER:: 204256
SPECIMEN QUALITY:: ADEQUATE

## 2018-08-24 LAB — B12 AND FOLATE PANEL: Vitamin B-12: 880 pg/mL (ref 211–911)

## 2018-08-24 LAB — TSH: TSH: 3.58 u[IU]/mL (ref 0.35–4.50)

## 2018-08-24 LAB — VITAMIN D 25 HYDROXY (VIT D DEFICIENCY, FRACTURES): VITD: 59.13 ng/mL (ref 30.00–100.00)

## 2018-08-25 ENCOUNTER — Telehealth: Payer: Self-pay | Admitting: Internal Medicine

## 2018-08-25 MED ORDER — AMOXICILLIN-POT CLAVULANATE 500-125 MG PO TABS: 1.0000 | ORAL_TABLET | Freq: Two times a day (BID) | ORAL | 0 refills | Status: DC

## 2018-08-25 NOTE — Telephone Encounter (Signed)
Copied from [place] [id number]. Topic: General - Inquiry >> Aug 25, 2018 12:51 PM Virl Axe D wrote: Reason for CRM: Pt's daughter [name] called to get lab results. NT unavailable. Please advise. [phone]

## 2018-08-25 NOTE — Addendum Note (Signed)
Addended byDamita Dunnings D on: 08/25/2018 09:46 AM   Modules accepted: Orders

## 2018-08-25 NOTE — Telephone Encounter (Signed)
Pt given lab results per notes of Dr Larose Kells on 08/25/18. Pt verbalized understanding.

## 2018-09-08 ENCOUNTER — Ambulatory Visit: Payer: Medicare Other | Admitting: Neurology

## 2018-09-22 ENCOUNTER — Ambulatory Visit: Payer: Medicare Other

## 2018-09-27 ENCOUNTER — Other Ambulatory Visit: Payer: Self-pay

## 2018-09-27 ENCOUNTER — Telehealth (INDEPENDENT_AMBULATORY_CARE_PROVIDER_SITE_OTHER): Payer: Medicare Other | Admitting: Internal Medicine

## 2018-09-27 ENCOUNTER — Encounter: Payer: Self-pay | Admitting: Internal Medicine

## 2018-09-27 DIAGNOSIS — R443 Hallucinations, unspecified: Secondary | ICD-10-CM

## 2018-09-27 DIAGNOSIS — R5383 Other fatigue: Secondary | ICD-10-CM | POA: Diagnosis not present

## 2018-09-27 DIAGNOSIS — Z7901 Long term (current) use of anticoagulants: Secondary | ICD-10-CM

## 2018-09-27 DIAGNOSIS — R410 Disorientation, unspecified: Secondary | ICD-10-CM | POA: Diagnosis not present

## 2018-09-27 DIAGNOSIS — I4891 Unspecified atrial fibrillation: Secondary | ICD-10-CM

## 2018-09-27 DIAGNOSIS — Z09 Encounter for follow-up examination after completed treatment for conditions other than malignant neoplasm: Secondary | ICD-10-CM

## 2018-09-27 DIAGNOSIS — R300 Dysuria: Secondary | ICD-10-CM

## 2018-09-27 DIAGNOSIS — R399 Unspecified symptoms and signs involving the genitourinary system: Secondary | ICD-10-CM

## 2018-09-27 MED ORDER — CEPHALEXIN 500 MG PO CAPS: 500.0000 mg | ORAL_CAPSULE | Freq: Three times a day (TID) | ORAL | 0 refills | Status: DC

## 2018-09-27 NOTE — Progress Notes (Signed)
Virtual Visit via Telephone Note  I connected with Ila Landowski Taulbee on 09/27/18 at  2:00 PM EDT by telephone and verified that I am speaking with the correct person using two identifiers.   I discussed the limitations, risks, security and privacy concerns of performing an evaluation and management service by telephone and the availability of in person appointments. I also discussed with the patient that there may be a patient responsible charge related to this service. The patient expressed understanding and agreed to proceed.   History of Present Illness: For the last couple of days, the patient has complained of some dysuria and lower abdomen pressure. She also had hallucinations which historically are associated with UTIs. [name] is concerned about a urinary tract infection. We also talk about other issues: HTN: Ambulatory BPs 130/70 Fatigue, see last visit, that has improved.  ROS: No fever chills No fall or head injuries No dizziness. On Imdur.  No chest pain.   Observations/Objective: This is telephone visit  Assessment and Plan:  Assessment(transfer Dr Linna Darner 04-2015) HTN Hyperlipidemia insomnia - xanax prn Asthma  Atrial fibrillation--rate controlled, anticoag Neuro --Stroke 11-2014, no residual deficits (TIA?) --Confusion, hallucinations, rx Seroquel 03-2018 [facility] Heart murmur: Mitral regurgitation DVT and pulmonary emboli 2006, vena cava filter 2011  MSK: tramadol qd prn --DJD --Back pain has seen Dr Nelva Bush, dr Rolena Infante, MRI: djd, stenosis, med treatment Colon [id number] -- Dr Annitta Jersey , d/c from oncology 06-2015 , No further colonoscopies Thrombocytopenia: Mild, fluctuating  PLAN Telephone visit UTI: Suspect UTI based on symptoms, we agreed that [name] will pick up a cup, get a sample of urine before the patient gets antibiotics.  She will refrigerate the sample and bring it in the morning.  Will start Keflex.  Further ABX adjustment with results. HTN: Ambulatory  BPs 130/70.  No change.  Needs a BMP.  Will set up Atrial fibrillation, Coumadin management: Due for an INR.  Will set up for next week. Fatigue, feeling sleepy: See last visit, that has improved Imdur: See last visit, was prescribed at the hospital 06/2018, we reduced the dose few weeks ago, she continued to tolerate it well and is chest pain-free.  No change for now Confusion, hallucinations: Probably due to a UTI, treating with antibiotics. We will set up a BMP and INR next week  Follow Up Instructions: As above   I discussed the assessment and treatment plan with the patient. The patient was provided an opportunity to ask questions and all were answered. The patient agreed with the plan and demonstrated an understanding of the instructions.   The patient was advised to call back or seek an in-person evaluation if the symptoms worsen or if the condition fails to improve as anticipated.  I provided  > 25 minutes of non-face-to-face time during this encounter.   Kathlene November, MD

## 2018-09-27 NOTE — Telephone Encounter (Signed)
Telephone visit scheduled

## 2018-09-28 ENCOUNTER — Other Ambulatory Visit: Payer: Self-pay

## 2018-09-28 ENCOUNTER — Telehealth: Payer: Self-pay

## 2018-09-28 ENCOUNTER — Other Ambulatory Visit (INDEPENDENT_AMBULATORY_CARE_PROVIDER_SITE_OTHER): Payer: Medicare Other

## 2018-09-28 DIAGNOSIS — R399 Unspecified symptoms and signs involving the genitourinary system: Secondary | ICD-10-CM

## 2018-09-28 DIAGNOSIS — I10 Essential (primary) hypertension: Secondary | ICD-10-CM

## 2018-09-28 LAB — URINALYSIS, ROUTINE W REFLEX MICROSCOPIC
BILIRUBIN URINE: NEGATIVE
Hgb urine dipstick: NEGATIVE
Ketones, ur: NEGATIVE
Nitrite: POSITIVE — AB
Specific Gravity, Urine: 1.015 (ref 1.000–1.030)
Total Protein, Urine: NEGATIVE
Urine Glucose: NEGATIVE
Urobilinogen, UA: 0.2 (ref 0.0–1.0)
pH: 7 (ref 5.0–8.0)

## 2018-09-28 NOTE — Telephone Encounter (Signed)
Fyi.

## 2018-09-28 NOTE — Assessment & Plan Note (Signed)
Telephone visit UTI: Suspect UTI based on symptoms, we agreed that [name] will pick up a cup, get a sample of urine before the patient gets antibiotics.  She will refrigerate the sample and bring it in the morning.  Will start Keflex.  Further ABX adjustment with results. HTN: Ambulatory BPs 130/70.  No change.  Needs a BMP.  Will set up Atrial fibrillation, Coumadin management: Due for an INR.  Will set up for next week. Fatigue, feeling sleepy: See last visit, that has improved Imdur: See last visit, was prescribed at the hospital 06/2018, we reduced the dose few weeks ago, she continued to tolerate it well and is chest pain-free.  No change for now Confusion, hallucinations: Probably due to a UTI, treating with antibiotics. We will set up a BMP and INR next week

## 2018-09-28 NOTE — Telephone Encounter (Signed)
I understand.  In addition to an INR she will need a BMP.

## 2018-09-28 NOTE — Telephone Encounter (Signed)
Called patient to schedule INR per Dr. Larose Kells. Daughter states patient will be coming when tent is set up here but she would rather she not come in office.

## 2018-09-30 LAB — URINE CULTURE
MICRO NUMBER:: 352367
SPECIMEN QUALITY:: ADEQUATE

## 2018-09-30 MED ORDER — CIPROFLOXACIN HCL 500 MG PO TABS: 500.0000 mg | ORAL_TABLET | Freq: Two times a day (BID) | ORAL | 0 refills | Status: DC

## 2018-09-30 NOTE — Addendum Note (Signed)
Addended byDamita Dunnings D on: 09/30/2018 12:47 PM   Modules accepted: Orders

## 2018-10-07 ENCOUNTER — Other Ambulatory Visit: Payer: Self-pay | Admitting: Internal Medicine

## 2018-10-17 ENCOUNTER — Other Ambulatory Visit: Payer: Self-pay

## 2018-10-17 ENCOUNTER — Ambulatory Visit (INDEPENDENT_AMBULATORY_CARE_PROVIDER_SITE_OTHER): Payer: Medicare Other | Admitting: Internal Medicine

## 2018-10-17 DIAGNOSIS — R443 Hallucinations, unspecified: Secondary | ICD-10-CM | POA: Diagnosis not present

## 2018-10-17 DIAGNOSIS — L03119 Cellulitis of unspecified part of limb: Secondary | ICD-10-CM | POA: Diagnosis not present

## 2018-10-17 DIAGNOSIS — I4891 Unspecified atrial fibrillation: Secondary | ICD-10-CM | POA: Diagnosis not present

## 2018-10-17 DIAGNOSIS — N390 Urinary tract infection, site not specified: Secondary | ICD-10-CM | POA: Diagnosis not present

## 2018-10-17 MED ORDER — AMOXICILLIN-POT CLAVULANATE 500-125 MG PO TABS: 1.0000 | ORAL_TABLET | Freq: Three times a day (TID) | ORAL | 0 refills | Status: DC

## 2018-10-17 MED ORDER — WARFARIN SODIUM 2.5 MG PO TABS: ORAL_TABLET | ORAL | 0 refills | Status: DC

## 2018-10-17 MED ORDER — ALBUTEROL SULFATE HFA 108 (90 BASE) MCG/ACT IN AERS: 2.0000 | INHALATION_SPRAY | Freq: Four times a day (QID) | RESPIRATORY_TRACT | 1 refills | Status: DC | PRN

## 2018-10-17 NOTE — Progress Notes (Signed)
Subjective:    Patient ID: [name], female    DOB: [birth year], 85 y.o.   MRN: [id number]  DOS:  10/17/2018 Type of visit - description: Virtual Visit via Telephone Note  I connected with@ on 10/18/18 at  2:00 PM EDT by telephone and verified that I am speaking with the correct person using two identifiers.  THIS ENCOUNTER IS A VIRTUAL VISIT DUE TO COVID-19 - PATIENT WAS NOT SEEN IN THE OFFICE. PATIENT HAS CONSENTED TO VIRTUAL VISIT / TELEMEDICINE VISIT   Location of patient: home  Location of provider: office  I discussed the limitations, risks, security and privacy concerns of performing an evaluation and management service by telephone and the availability of in person appointments. I also discussed with the patient that there may be a patient responsible charge related to this service. The patient expressed understanding and agreed to proceed.   History of Present Illness: Acute visit I spoke with the patient's daughter, she was diagnosed with a UTI via a urine culture on 09/28/2018, initially took Keflex for 1 day but then was switched to ciprofloxacin for 3 days. She overall improve however shortly after she finished antibiotic her hallucinations returned. She is now also sometimes very sleepy and sometimes combative and agitated.  Additionally, the right leg looks a slightly swollen below the knee with some warmness to touch.   Review of Systems No fever chills No cough No nausea, vomiting, diarrhea Appetite is somewhat decreased. Ambulatory BP is very good.  Past Medical History:  Diagnosis Date  . Acute encephalopathy   . Acute renal failure superimposed on stage 3 chronic kidney disease ([place])   . Anemia   . Atrial fibrillation ([place])   . Colon cancer ([place]) 12/11  . Colon cancer ([place]) 2011  . Diarrhea   . DVT (deep venous thrombosis) ([place]) 11/06  . Elevated brain natriuretic peptide (BNP) level   . Frequent headaches   . Heart murmur   . Hyperkalemia   .  Hypertension   . Pulmonary embolism ([place]) 11/06  . Seizures ([place])   . Stroke [facility]) 11/30/2014    Past Surgical History:  Procedure Laterality Date  . APPENDECTOMY  1952  . COLECTOMY  06/2010   partial, Dr. Donne Hazel complicated by LLE CVT; S/P IVC umbrella & anemia  . HIP FRACTURE SURGERY  2006   Trauma  . TOTAL ABDOMINAL HYSTERECTOMY W/ BILATERAL SALPINGOOPHORECTOMY  1973   For Fibroids  . TOTAL HIP ARTHROPLASTY  01/03/07   Left hip replacement.  Marland Kitchen VENA CAVA FILTER PLACEMENT  06/28/2010    Social History   Socioeconomic History  . Marital status: Widowed    Spouse name: Not on file  . Number of children: 1  . Years of education: Not on file  . Highest education level: Not on file  Occupational History  . Occupation: n/d    Employer: RETIRED  Social Needs  . Financial resource strain: Not on file  . Food insecurity:    Worry: Not on file    Inability: Not on file  . Transportation needs:    Medical: Not on file    Non-medical: Not on file  Tobacco Use  . Smoking status: Passive Smoke Exposure - Never Smoker  . Smokeless tobacco: Never Used  . Tobacco comment: husband smoked in home.   Substance and Sexual Activity  . Alcohol use: No    Alcohol/week: 0.0 standard drinks  . Drug use: No  . Sexual activity: Not Currently  Lifestyle  .  Physical activity:    Days per week: Not on file    Minutes per session: Not on file  . Stress: Not on file  Relationships  . Social connections:    Talks on phone: Not on file    Gets together: Not on file    Attends religious service: Not on file    Active member of club or organization: Not on file    Attends meetings of clubs or organizations: Not on file    Relationship status: Not on file  . Intimate partner violence:    Fear of current or ex partner: Not on file    Emotionally abused: Not on file    Physically abused: Not on file    Forced sexual activity: Not on file  Other Topics Concern  . Not on file  Social  History Narrative   Lives w/ her daughter       Allergies as of 10/17/2018   No Known Allergies     Medication List       Accurate as of October 17, 2018 11:59 PM. Always use your most recent med list.        acetaminophen 500 MG tablet Commonly known as:  TYLENOL Take 1,000 mg by mouth every 8 (eight) hours as needed for headache (pain).   albuterol 108 (90 Base) MCG/ACT inhaler Commonly known as:  Ventolin HFA Inhale 2 puffs into the lungs every 6 (six) hours as needed for wheezing or shortness of breath.   amoxicillin-clavulanate 500-125 MG tablet Commonly known as:  Augmentin Take 1 tablet (500 mg total) by mouth 3 (three) times daily.   atorvastatin 20 MG tablet Commonly known as:  LIPITOR Take 1 tablet (20 mg total) by mouth daily.   calcium-vitamin D 500-200 MG-UNIT tablet Commonly known as:  OSCAL WITH D Take 1 tablet by mouth daily with breakfast.   ciprofloxacin 500 MG tablet Commonly known as:  Cipro Take 1 tablet (500 mg total) by mouth 2 (two) times daily.   Ensure Take 237 mLs by mouth daily with breakfast. Milk chocolate   Fluticasone-Salmeterol 100-50 MCG/DOSE Aepb Commonly known as:  Wixela Inhub Inhale 1 puff into the lungs 2 (two) times daily.   furosemide 80 MG tablet Commonly known as:  LASIX Take 1 tablet (80 mg total) by mouth daily.   isosorbide mononitrate 30 MG 24 hr tablet Commonly known as:  IMDUR Take 0.5 tablets (15 mg total) by mouth daily.   loratadine 10 MG tablet Commonly known as:  CLARITIN Take 1 tablet (10 mg total) by mouth daily as needed (seasonal allergies).   metoprolol tartrate 25 MG tablet Commonly known as:  LOPRESSOR Take 1.5 tablets (37.5 mg total) by mouth 2 (two) times daily.   multivitamin with minerals Tabs tablet Take 1 tablet by mouth daily.   nitroGLYCERIN 0.4 MG SL tablet Commonly known as:  NITROSTAT Place 1 tablet (0.4 mg total) under the tongue every 5 (five) minutes as needed for chest pain.    polyethylene glycol 17 g packet Commonly known as:  MIRALAX / GLYCOLAX Take 17 g by mouth daily as needed.   promethazine 12.5 MG tablet Commonly known as:  PHENERGAN Take 1 tablet (12.5 mg total) by mouth 3 (three) times daily as needed for nausea or vomiting.   QUEtiapine 25 MG tablet Commonly known as:  SEROquel Take 1 tablet (25 mg total) by mouth at bedtime.   traMADol 50 MG tablet Commonly known as:  ULTRAM Take 1 tablet (  50 mg total) by mouth every 6 (six) hours as needed for moderate pain.   verapamil 120 MG CR tablet Commonly known as:  CALAN-SR Take 1 tablet (120 mg total) by mouth at bedtime.   warfarin 2.5 MG tablet Commonly known as:  COUMADIN Take as directed by the anticoagulation clinic. If you are unsure how to take this medication, talk to your nurse or doctor. Original instructions:  USE AS DIRECTED BY COUMADIN CLINIC           Objective:   Physical Exam There were no vitals taken for this visit. This is a virtual visit by phone, I spoke with the patient's daughter, the patient has dementia/hallucinations.    Assessment     Assessment(transfer Dr Linna Darner 04-2015) HTN Hyperlipidemia insomnia - xanax prn Asthma  Atrial fibrillation--rate controlled, anticoag Neuro --Stroke 11-2014, no residual deficits (TIA?) --Confusion, hallucinations, rx Seroquel 03-2018 [facility] Heart murmur: Mitral regurgitation DVT and pulmonary emboli 2006, vena cava filter 2011  MSK: tramadol qd prn --DJD --Back pain has seen Dr Nelva Bush, dr Rolena Infante, MRI: djd, stenosis, med treatment Colon cancer2011 -- Dr Annitta Jersey , d/c from oncology 06-2015 , No further colonoscopies Thrombocytopenia: Mild, fluctuating  PLAN Telephone visit UTI: Documented UTI recently, see urine culture, S/P Keflex and Cipro x3 days, symptoms (hallucinations) have returned after she finished antibiotics. Also question of cellulitis at the right leg. Under normal circumstances, I will bring the patient  to the office but that is no the best alternative at this point d/t the coronavirus pandemia, [name] is aware of the limitations we are facing. Options are to treat her with more antibiotics versus send her to ER evaluation, we elected outpatient treatment. Will Rx Augmentin 500 mg 3 times daily x10 days.  That should cover her UTI and the possible cellulitis. Lots of fluids, family will let me know how she is doing in few days, ER if symptoms increase. Cellulitis?:  See above Atrial fibrillation: RF Coumadin Refill albuterol today.    I discussed the assessment and treatment plan with the patient. The patient was provided an opportunity to ask questions and all were answered. The patient agreed with the plan and demonstrated an understanding of the instructions.   The patient was advised to call back or seek an in-person evaluation if the symptoms worsen or if the condition fails to improve as anticipated.  I provided  25  minutes of non-face-to-face time during this encounter.  Kathlene November, MD

## 2018-10-18 NOTE — Assessment & Plan Note (Signed)
Telephone visit UTI: Documented UTI recently, see urine culture, S/P Keflex and Cipro x3 days, symptoms (hallucinations) have returned after she finished antibiotics. Also question of cellulitis at the right leg. Under normal circumstances, I will bring the patient to the office but that is no the best alternative at this point d/t the coronavirus pandemia, [name] is aware of the limitations we are facing. Options are to treat her with more antibiotics versus send her to ER evaluation, we elected outpatient treatment. Will Rx Augmentin 500 mg 3 times daily x10 days.  That should cover her UTI and the possible cellulitis. Lots of fluids, family will let me know how she is doing in few days, ER if symptoms increase. Cellulitis?:  See above Atrial fibrillation: RF Coumadin Refill albuterol today.

## 2018-10-21 ENCOUNTER — Ambulatory Visit: Payer: Medicare Other | Admitting: Internal Medicine

## 2018-10-28 ENCOUNTER — Telehealth: Payer: Self-pay | Admitting: Internal Medicine

## 2018-10-28 NOTE — Telephone Encounter (Signed)
ok 

## 2018-10-28 NOTE — Telephone Encounter (Signed)
Called patient per Dr. Barnie Alderman' request. Daughter stat4s they would like to wait another week or two because of the [facility] Virus.

## 2018-10-28 NOTE — Telephone Encounter (Signed)
Please call the patient's daughter, due for an INR.  Please arrange.

## 2018-11-11 ENCOUNTER — Other Ambulatory Visit: Payer: Self-pay | Admitting: Internal Medicine

## 2018-11-18 ENCOUNTER — Other Ambulatory Visit: Payer: Self-pay | Admitting: Cardiovascular Disease

## 2018-11-18 NOTE — Telephone Encounter (Signed)
OK to refill.   [name]

## 2018-11-18 NOTE — Telephone Encounter (Signed)
Pt requesting a refill on furosemide. Would Dr. Angelena Form like to refill this medication? Please address

## 2018-11-21 MED ORDER — FUROSEMIDE 80 MG PO TABS: 80.0000 mg | ORAL_TABLET | Freq: Every day | ORAL | 4 refills | Status: DC

## 2018-11-21 NOTE — Telephone Encounter (Signed)
Pt's medication was sent to pt's pharmacy as requested. Confirmation received.  °

## 2018-12-10 ENCOUNTER — Ambulatory Visit (INDEPENDENT_AMBULATORY_CARE_PROVIDER_SITE_OTHER): Payer: Medicare Other | Admitting: Internal Medicine

## 2018-12-10 ENCOUNTER — Encounter: Payer: Self-pay | Admitting: Internal Medicine

## 2018-12-10 VITALS — BP 136/73 | HR 68

## 2018-12-10 DIAGNOSIS — R600 Localized edema: Secondary | ICD-10-CM | POA: Diagnosis not present

## 2018-12-10 MED ORDER — CEPHALEXIN 500 MG PO CAPS: 500.0000 mg | ORAL_CAPSULE | Freq: Three times a day (TID) | ORAL | 0 refills | Status: DC

## 2018-12-10 NOTE — Progress Notes (Signed)
Subjective:    Patient ID: [name], female    DOB: 05/09/1927, 86 y.o.   MRN: [id number]  HPI Visit due to concern about cellulitis in her foot  Interactive audio and video telecommunications were attempted between this provider and patient, however failed, due to patient having technical difficulties OR patient did not have access to video capability.  We continued and completed visit with audio only.   Virtual Visit via Telephone Note  I connected with [name] on 12/10/18 at  9:20 AM EDT by telephone and verified that I am speaking with the correct person using two identifiers.  Location: Patient: home with daughter [name] Provider: office   I discussed the limitations, risks, security and privacy concerns of performing an evaluation and management service by telephone and the availability of in person appointments. I also discussed with the patient that there may be a patient responsible charge related to this service. The patient expressed understanding and agreed to proceed.   History of Present Illness: She again has swelling in her right foot--this has been a recurrent issue Had infections while in rehab some time ago Also empiric Rx with augmentin for possible infection and UTI about 2 months ago  Does have chronic leg swelling but the right foot is now worse Midfoot to heel is now red with increased swelling above the ankle This looks just like it has in the past  Bedridden Incontinent Continues on the furosemide No change in breathing----does have stable DOE--just getting up to go to bathroom No fever No chest pain Eating fairly well  Current Outpatient Medications on File Prior to Visit  Medication Sig Dispense Refill  . acetaminophen (TYLENOL) 500 MG tablet Take 1,000 mg by mouth every 8 (eight) hours as needed for headache (pain).     Marland Kitchen albuterol (VENTOLIN HFA) 108 (90 Base) MCG/ACT inhaler Inhale 2 puffs into the lungs every 6 (six) hours as  needed for wheezing or shortness of breath. 2 Inhaler 1  . atorvastatin (LIPITOR) 20 MG tablet Take 1 tablet (20 mg total) by mouth daily. 90 tablet 1  . calcium-vitamin D (OSCAL WITH D) 500-200 MG-UNIT tablet Take 1 tablet by mouth daily with breakfast. 30 tablet 0  . ENSURE (ENSURE) Take 237 mLs by mouth daily with breakfast. Milk chocolate    . Fluticasone-Salmeterol (WIXELA INHUB) 100-50 MCG/DOSE AEPB Inhale 1 puff into the lungs 2 (two) times daily. 60 each 12  . furosemide (LASIX) 80 MG tablet Take 1 tablet (80 mg total) by mouth daily. 30 tablet 4  . isosorbide mononitrate (IMDUR) 30 MG 24 hr tablet Take 0.5 tablets (15 mg total) by mouth daily. 45 tablet 3  . loratadine (CLARITIN) 10 MG tablet Take 1 tablet (10 mg total) by mouth daily as needed (seasonal allergies). 30 tablet 0  . metoprolol tartrate (LOPRESSOR) 25 MG tablet Take 1.5 tablets (37.5 mg total) by mouth 2 (two) times daily. 90 tablet 0  . Multiple Vitamin (MULTIVITAMIN WITH MINERALS) TABS tablet Take 1 tablet by mouth daily. 30 tablet 0  . nitroGLYCERIN (NITROSTAT) 0.4 MG SL tablet Place 1 tablet (0.4 mg total) under the tongue every 5 (five) minutes as needed for chest pain. 20 tablet 0  . polyethylene glycol (MIRALAX / GLYCOLAX) packet Take 17 g by mouth daily as needed. 14 each 0  . QUEtiapine (SEROQUEL) 25 MG tablet Take 1 tablet (25 mg total) by mouth at bedtime. 90 tablet 1  . verapamil (CALAN-SR) 120 MG  CR tablet Take 1 tablet (120 mg total) by mouth at bedtime. 90 tablet 3  . warfarin (COUMADIN) 2.5 MG tablet USE AS DIRECTED BY COUMADIN CLINIC 90 tablet 0  . promethazine (PHENERGAN) 12.5 MG tablet Take 1 tablet (12.5 mg total) by mouth 3 (three) times daily as needed for nausea or vomiting. (Patient not taking: Reported on 12/10/2018) 30 tablet 0  . traMADol (ULTRAM) 50 MG tablet Take 1 tablet (50 mg total) by mouth every 6 (six) hours as needed for moderate pain. (Patient not taking: Reported on 12/10/2018) 28 tablet 0    No current facility-administered medications on file prior to visit.     No Known Allergies  Past Medical History:  Diagnosis Date  . Acute encephalopathy   . Acute renal failure superimposed on stage 3 chronic kidney disease ([place])   . Anemia   . Atrial fibrillation ([place])   . Colon cancer ([place]) 12/11  . Colon cancer ([place]) 2011  . Diarrhea   . DVT (deep venous thrombosis) ([place]) 11/06  . Elevated brain natriuretic peptide (BNP) level   . Frequent headaches   . Heart murmur   . Hyperkalemia   . Hypertension   . Pulmonary embolism ([place]) 11/06  . Seizures ([place])   . Stroke [facility]) 11/30/2014    Past Surgical History:  Procedure Laterality Date  . APPENDECTOMY  1952  . COLECTOMY  06/2010   partial, Dr. Donne Hazel complicated by LLE CVT; S/P IVC umbrella & anemia  . HIP FRACTURE SURGERY  2006   Trauma  . TOTAL ABDOMINAL HYSTERECTOMY W/ BILATERAL SALPINGOOPHORECTOMY  1973   For Fibroids  . TOTAL HIP ARTHROPLASTY  01/03/07   Left hip replacement.  Marland Kitchen VENA CAVA FILTER PLACEMENT  06/28/2010    Family History  Problem Relation Age of Onset  . Peripheral vascular disease Father   . Heart attack Mother 10  . Heart disease Mother        MI  . Coronary artery disease Sister   . Diabetes Paternal Grandmother   . Coronary artery disease Maternal Grandfather     Social History   Socioeconomic History  . Marital status: Widowed    Spouse name: Not on file  . Number of children: 1  . Years of education: Not on file  . Highest education level: Not on file  Occupational History  . Occupation: n/d    Employer: RETIRED  Social Needs  . Financial resource strain: Not on file  . Food insecurity:    Worry: Not on file    Inability: Not on file  . Transportation needs:    Medical: Not on file    Non-medical: Not on file  Tobacco Use  . Smoking status: Passive Smoke Exposure - Never Smoker  . Smokeless tobacco: Never Used  . Tobacco comment: husband smoked in home.   Substance  and Sexual Activity  . Alcohol use: No    Alcohol/week: 0.0 standard drinks  . Drug use: No  . Sexual activity: Not Currently  Lifestyle  . Physical activity:    Days per week: Not on file    Minutes per session: Not on file  . Stress: Not on file  Relationships  . Social connections:    Talks on phone: Not on file    Gets together: Not on file    Attends religious service: Not on file    Active member of club or organization: Not on file    Attends meetings of clubs or organizations: Not  on file    Relationship status: Not on file  . Intimate partner violence:    Fear of current or ex partner: Not on file    Emotionally abused: Not on file    Physically abused: Not on file    Forced sexual activity: Not on file  Other Topics Concern  . Not on file  Social History Narrative   Lives w/ her daughter     Observations/Objective: No apparent distress  Assessment and Plan: See problem list  Follow Up Instructions:    I discussed the assessment and treatment plan with the patient. The patient was provided an opportunity to ask questions and all were answered. The patient agreed with the plan and demonstrated an understanding of the instructions.   The patient was advised to call back or seek an in-person evaluation if the symptoms worsen or if the condition fails to improve as anticipated.  I provided 11 minutes of non-face-to-face time during this encounter.   Viviana Simpler, MD    Review of Systems     Objective:   Physical Exam         Assessment & Plan:

## 2018-12-10 NOTE — Assessment & Plan Note (Signed)
Appearance is exactly the same as past infections per her daughter Discussed alternatives---she has tolerated all antibiotics She did get cephalexin earlier this year---and daughter thinks it was effective for the cellulitis She will check in by Monday if it isn't improving

## 2018-12-11 ENCOUNTER — Telehealth: Payer: Self-pay | Admitting: Internal Medicine

## 2018-12-11 NOTE — Telephone Encounter (Signed)
Patient is overdue for an INR check, please arrange.

## 2018-12-16 ENCOUNTER — Encounter: Payer: Self-pay | Admitting: Internal Medicine

## 2018-12-16 ENCOUNTER — Other Ambulatory Visit: Payer: Self-pay | Admitting: Internal Medicine

## 2018-12-16 MED ORDER — CEPHALEXIN 500 MG PO CAPS: 500.0000 mg | ORAL_CAPSULE | Freq: Three times a day (TID) | ORAL | 0 refills | Status: DC

## 2018-12-21 ENCOUNTER — Ambulatory Visit: Payer: Medicare Other

## 2018-12-23 ENCOUNTER — Other Ambulatory Visit: Payer: Self-pay | Admitting: Internal Medicine

## 2019-01-03 ENCOUNTER — Encounter: Payer: Self-pay | Admitting: Internal Medicine

## 2019-01-04 DIAGNOSIS — R339 Retention of urine, unspecified: Secondary | ICD-10-CM

## 2019-01-04 HISTORY — DX: Retention of urine, unspecified: R33.9

## 2019-01-06 ENCOUNTER — Other Ambulatory Visit: Payer: Self-pay | Admitting: Internal Medicine

## 2019-01-19 ENCOUNTER — Encounter: Payer: Self-pay | Admitting: Internal Medicine

## 2019-01-19 DIAGNOSIS — R399 Unspecified symptoms and signs involving the genitourinary system: Secondary | ICD-10-CM

## 2019-01-20 ENCOUNTER — Emergency Department (HOSPITAL_BASED_OUTPATIENT_CLINIC_OR_DEPARTMENT_OTHER): Payer: Medicare Other

## 2019-01-20 ENCOUNTER — Other Ambulatory Visit: Payer: Self-pay

## 2019-01-20 ENCOUNTER — Emergency Department (HOSPITAL_COMMUNITY): Payer: Medicare Other

## 2019-01-20 ENCOUNTER — Inpatient Hospital Stay (HOSPITAL_COMMUNITY)
Admission: EM | Admit: 2019-01-20 | Discharge: 2019-01-24 | DRG: 291 | Disposition: A | Discharge status: Home / Self Care | Payer: Medicare Other | Attending: Internal Medicine | Admitting: Internal Medicine

## 2019-01-20 ENCOUNTER — Encounter: Payer: Self-pay | Admitting: Internal Medicine

## 2019-01-20 ENCOUNTER — Encounter (HOSPITAL_COMMUNITY): Payer: Self-pay | Admitting: Emergency Medicine

## 2019-01-20 DIAGNOSIS — I5033 Acute on chronic diastolic (congestive) heart failure: Secondary | ICD-10-CM | POA: Diagnosis not present

## 2019-01-20 DIAGNOSIS — R52 Pain, unspecified: Secondary | ICD-10-CM

## 2019-01-20 DIAGNOSIS — R339 Retention of urine, unspecified: Secondary | ICD-10-CM | POA: Diagnosis present

## 2019-01-20 DIAGNOSIS — E876 Hypokalemia: Secondary | ICD-10-CM | POA: Diagnosis not present

## 2019-01-20 DIAGNOSIS — Z96642 Presence of left artificial hip joint: Secondary | ICD-10-CM | POA: Diagnosis present

## 2019-01-20 DIAGNOSIS — G9340 Encephalopathy, unspecified: Secondary | ICD-10-CM | POA: Diagnosis not present

## 2019-01-20 DIAGNOSIS — E785 Hyperlipidemia, unspecified: Secondary | ICD-10-CM | POA: Diagnosis not present

## 2019-01-20 DIAGNOSIS — N183 Chronic kidney disease, stage 3 (moderate): Secondary | ICD-10-CM | POA: Diagnosis present

## 2019-01-20 DIAGNOSIS — Z8673 Personal history of transient ischemic attack (TIA), and cerebral infarction without residual deficits: Secondary | ICD-10-CM

## 2019-01-20 DIAGNOSIS — Z90710 Acquired absence of both cervix and uterus: Secondary | ICD-10-CM

## 2019-01-20 DIAGNOSIS — J929 Pleural plaque without asbestos: Secondary | ICD-10-CM | POA: Diagnosis not present

## 2019-01-20 DIAGNOSIS — I110 Hypertensive heart disease with heart failure: Secondary | ICD-10-CM | POA: Diagnosis not present

## 2019-01-20 DIAGNOSIS — Z87440 Personal history of urinary (tract) infections: Secondary | ICD-10-CM

## 2019-01-20 DIAGNOSIS — R069 Unspecified abnormalities of breathing: Secondary | ICD-10-CM | POA: Diagnosis not present

## 2019-01-20 DIAGNOSIS — Z7901 Long term (current) use of anticoagulants: Secondary | ICD-10-CM

## 2019-01-20 DIAGNOSIS — R0902 Hypoxemia: Secondary | ICD-10-CM | POA: Diagnosis not present

## 2019-01-20 DIAGNOSIS — I509 Heart failure, unspecified: Secondary | ICD-10-CM | POA: Diagnosis not present

## 2019-01-20 DIAGNOSIS — M25552 Pain in left hip: Secondary | ICD-10-CM | POA: Diagnosis present

## 2019-01-20 DIAGNOSIS — D696 Thrombocytopenia, unspecified: Secondary | ICD-10-CM | POA: Diagnosis not present

## 2019-01-20 DIAGNOSIS — Z85038 Personal history of other malignant neoplasm of large intestine: Secondary | ICD-10-CM

## 2019-01-20 DIAGNOSIS — R918 Other nonspecific abnormal finding of lung field: Secondary | ICD-10-CM | POA: Diagnosis not present

## 2019-01-20 DIAGNOSIS — I5030 Unspecified diastolic (congestive) heart failure: Secondary | ICD-10-CM | POA: Diagnosis not present

## 2019-01-20 DIAGNOSIS — Z86718 Personal history of other venous thrombosis and embolism: Secondary | ICD-10-CM

## 2019-01-20 DIAGNOSIS — R0602 Shortness of breath: Secondary | ICD-10-CM | POA: Diagnosis not present

## 2019-01-20 DIAGNOSIS — Z7722 Contact with and (suspected) exposure to environmental tobacco smoke (acute) (chronic): Secondary | ICD-10-CM | POA: Diagnosis present

## 2019-01-20 DIAGNOSIS — R609 Edema, unspecified: Secondary | ICD-10-CM

## 2019-01-20 DIAGNOSIS — N179 Acute kidney failure, unspecified: Secondary | ICD-10-CM | POA: Diagnosis not present

## 2019-01-20 DIAGNOSIS — I4891 Unspecified atrial fibrillation: Secondary | ICD-10-CM | POA: Diagnosis not present

## 2019-01-20 DIAGNOSIS — D649 Anemia, unspecified: Secondary | ICD-10-CM | POA: Diagnosis present

## 2019-01-20 DIAGNOSIS — Z90722 Acquired absence of ovaries, bilateral: Secondary | ICD-10-CM

## 2019-01-20 DIAGNOSIS — Z1159 Encounter for screening for other viral diseases: Secondary | ICD-10-CM

## 2019-01-20 DIAGNOSIS — Z833 Family history of diabetes mellitus: Secondary | ICD-10-CM

## 2019-01-20 DIAGNOSIS — I482 Chronic atrial fibrillation, unspecified: Secondary | ICD-10-CM | POA: Diagnosis not present

## 2019-01-20 DIAGNOSIS — Z86711 Personal history of pulmonary embolism: Secondary | ICD-10-CM

## 2019-01-20 DIAGNOSIS — R531 Weakness: Secondary | ICD-10-CM | POA: Diagnosis not present

## 2019-01-20 DIAGNOSIS — I130 Hypertensive heart and chronic kidney disease with heart failure and stage 1 through stage 4 chronic kidney disease, or unspecified chronic kidney disease: Principal | ICD-10-CM | POA: Diagnosis present

## 2019-01-20 DIAGNOSIS — Z03818 Encounter for observation for suspected exposure to other biological agents ruled out: Secondary | ICD-10-CM | POA: Diagnosis not present

## 2019-01-20 DIAGNOSIS — Z8249 Family history of ischemic heart disease and other diseases of the circulatory system: Secondary | ICD-10-CM

## 2019-01-20 LAB — COMPREHENSIVE METABOLIC PANEL
ALT: 15 U/L (ref 0–44)
AST: 25 U/L (ref 15–41)
Albumin: 3.8 g/dL (ref 3.5–5.0)
Alkaline Phosphatase: 41 U/L (ref 38–126)
Anion gap: 12 (ref 5–15)
BUN: 30 mg/dL — ABNORMAL HIGH (ref 8–23)
CO2: 26 mmol/L (ref 22–32)
Calcium: 9.4 mg/dL (ref 8.9–10.3)
Chloride: 107 mmol/L (ref 98–111)
Creatinine, Ser: 2.13 mg/dL — ABNORMAL HIGH (ref 0.44–1.00)
GFR calc Af Amer: 23 mL/min — ABNORMAL LOW (ref >60)
GFR calc non Af Amer: 20 mL/min — ABNORMAL LOW (ref >60)
Glucose, Bld: 106 mg/dL — ABNORMAL HIGH (ref 70–99)
Potassium: 3.8 mmol/L (ref 3.5–5.1)
Sodium: 145 mmol/L (ref 135–145)
Total Bilirubin: 0.7 mg/dL (ref 0.3–1.2)
Total Protein: 5.9 g/dL — ABNORMAL LOW (ref 6.5–8.1)

## 2019-01-20 LAB — URINALYSIS, ROUTINE W REFLEX MICROSCOPIC
Bilirubin Urine: NEGATIVE
Glucose, UA: NEGATIVE mg/dL
Hgb urine dipstick: NEGATIVE
Ketones, ur: NEGATIVE mg/dL
Leukocytes,Ua: NEGATIVE
Nitrite: NEGATIVE
Protein, ur: NEGATIVE mg/dL
Specific Gravity, Urine: 1.008 (ref 1.005–1.030)
pH: 7 (ref 5.0–8.0)

## 2019-01-20 LAB — SARS CORONAVIRUS 2 BY RT PCR (HOSPITAL ORDER, PERFORMED IN ~~LOC~~ HOSPITAL LAB): SARS Coronavirus 2: NEGATIVE

## 2019-01-20 LAB — CBC WITH DIFFERENTIAL/PLATELET
Abs Immature Granulocytes: 0 10*3/uL (ref 0.00–0.07)
Basophils Absolute: 0 10*3/uL (ref 0.0–0.1)
Basophils Relative: 0 %
Eosinophils Absolute: 0.1 10*3/uL (ref 0.0–0.5)
Eosinophils Relative: 2 %
HCT: 38.8 % (ref 36.0–46.0)
Hemoglobin: 12 g/dL (ref 12.0–15.0)
Immature Granulocytes: 0 %
Lymphocytes Relative: 18 %
Lymphs Abs: 0.8 10*3/uL (ref 0.7–4.0)
MCH: 30.2 pg (ref 26.0–34.0)
MCHC: 30.9 g/dL (ref 30.0–36.0)
MCV: 97.5 fL (ref 80.0–100.0)
Monocytes Absolute: 0.5 10*3/uL (ref 0.1–1.0)
Monocytes Relative: 11 %
Neutro Abs: 3.1 10*3/uL (ref 1.7–7.7)
Neutrophils Relative %: 69 %
Platelets: 76 10*3/uL — ABNORMAL LOW (ref 150–400)
RBC: 3.98 MIL/uL (ref 3.87–5.11)
RDW: 15.4 % (ref 11.5–15.5)
WBC: 4.6 10*3/uL (ref 4.0–10.5)
nRBC: 0 % (ref 0.0–0.2)

## 2019-01-20 LAB — PROTIME-INR
INR: 3.7 — ABNORMAL HIGH (ref 0.8–1.2)
Prothrombin Time: 36.4 seconds — ABNORMAL HIGH (ref 11.4–15.2)

## 2019-01-20 LAB — TROPONIN I (HIGH SENSITIVITY)
Troponin I (High Sensitivity): 6 ng/L (ref <18)
Troponin I (High Sensitivity): 7 ng/L (ref <18)

## 2019-01-20 LAB — BRAIN NATRIURETIC PEPTIDE: B Natriuretic Peptide: 852 pg/mL — ABNORMAL HIGH (ref 0.0–100.0)

## 2019-01-20 MED ORDER — ASPIRIN EC 81 MG PO TBEC
81.0000 mg | DELAYED_RELEASE_TABLET | Freq: Every day | ORAL | Status: DC
  Administered 2019-01-21 – 2019-01-24 (×4): 81 mg via ORAL
  Filled 2019-01-20 (×4): qty 1

## 2019-01-20 MED ORDER — PROMETHAZINE HCL 25 MG PO TABS: 12.5000 mg | ORAL_TABLET | Freq: Three times a day (TID) | ORAL | Status: DC | PRN

## 2019-01-20 MED ORDER — ALBUTEROL SULFATE (2.5 MG/3ML) 0.083% IN NEBU: 2.5000 mg | INHALATION_SOLUTION | Freq: Four times a day (QID) | RESPIRATORY_TRACT | Status: DC | PRN

## 2019-01-20 MED ORDER — MOMETASONE FURO-FORMOTEROL FUM 100-5 MCG/ACT IN AERO
2.0000 | INHALATION_SPRAY | Freq: Two times a day (BID) | RESPIRATORY_TRACT | Status: DC
  Administered 2019-01-21 – 2019-01-24 (×7): 2 via RESPIRATORY_TRACT
  Filled 2019-01-20 (×2): qty 8.8

## 2019-01-20 MED ORDER — ACETAMINOPHEN 500 MG PO TABS
1000.0000 mg | ORAL_TABLET | Freq: Three times a day (TID) | ORAL | Status: DC | PRN
  Administered 2019-01-21 – 2019-01-24 (×6): 1000 mg via ORAL
  Filled 2019-01-20 (×6): qty 2

## 2019-01-20 MED ORDER — ISOSORBIDE MONONITRATE ER 30 MG PO TB24
15.0000 mg | ORAL_TABLET | Freq: Every day | ORAL | Status: DC
  Administered 2019-01-21 – 2019-01-24 (×4): 15 mg via ORAL
  Filled 2019-01-20 (×4): qty 1

## 2019-01-20 MED ORDER — QUETIAPINE FUMARATE 25 MG PO TABS
25.0000 mg | ORAL_TABLET | Freq: Every day | ORAL | Status: DC
  Administered 2019-01-21 – 2019-01-23 (×3): 25 mg via ORAL
  Filled 2019-01-20 (×3): qty 1

## 2019-01-20 MED ORDER — ALBUTEROL SULFATE (2.5 MG/3ML) 0.083% IN NEBU: 2.5000 mg | INHALATION_SOLUTION | RESPIRATORY_TRACT | Status: DC | PRN

## 2019-01-20 MED ORDER — METOPROLOL TARTRATE 25 MG PO TABS
37.5000 mg | ORAL_TABLET | Freq: Two times a day (BID) | ORAL | Status: DC
  Administered 2019-01-20 – 2019-01-24 (×8): 37.5 mg via ORAL
  Filled 2019-01-20 (×8): qty 1

## 2019-01-20 MED ORDER — SODIUM CHLORIDE 0.9% FLUSH: 3.0000 mL | INTRAVENOUS | Status: DC | PRN

## 2019-01-20 MED ORDER — ATORVASTATIN CALCIUM 10 MG PO TABS
20.0000 mg | ORAL_TABLET | Freq: Every day | ORAL | Status: DC
  Administered 2019-01-20 – 2019-01-24 (×5): 20 mg via ORAL
  Filled 2019-01-20 (×5): qty 2

## 2019-01-20 MED ORDER — SODIUM CHLORIDE 0.9 % IV BOLUS
500.0000 mL | Freq: Once | INTRAVENOUS | Status: AC
  Administered 2019-01-20: 500 mL via INTRAVENOUS

## 2019-01-20 MED ORDER — FUROSEMIDE 10 MG/ML IJ SOLN
40.0000 mg | Freq: Two times a day (BID) | INTRAMUSCULAR | Status: DC
  Administered 2019-01-20 – 2019-01-23 (×6): 40 mg via INTRAVENOUS
  Filled 2019-01-20 (×6): qty 4

## 2019-01-20 MED ORDER — ADULT MULTIVITAMIN W/MINERALS CH
1.0000 | ORAL_TABLET | Freq: Every day | ORAL | Status: DC
  Administered 2019-01-21 – 2019-01-24 (×4): 1 via ORAL
  Filled 2019-01-20 (×4): qty 1

## 2019-01-20 MED ORDER — TRAMADOL HCL 50 MG PO TABS: 50.0000 mg | ORAL_TABLET | Freq: Four times a day (QID) | ORAL | Status: DC | PRN

## 2019-01-20 MED ORDER — POLYETHYLENE GLYCOL 3350 17 G PO PACK: 17.0000 g | PACK | Freq: Every day | ORAL | Status: DC | PRN

## 2019-01-20 MED ORDER — ONDANSETRON HCL 4 MG/2ML IJ SOLN: 4.0000 mg | Freq: Four times a day (QID) | INTRAMUSCULAR | Status: DC | PRN

## 2019-01-20 MED ORDER — TAMSULOSIN HCL 0.4 MG PO CAPS
0.4000 mg | ORAL_CAPSULE | Freq: Every day | ORAL | Status: DC
  Administered 2019-01-20 – 2019-01-24 (×5): 0.4 mg via ORAL
  Filled 2019-01-20 (×5): qty 1

## 2019-01-20 MED ORDER — LORATADINE 10 MG PO TABS: 10.0000 mg | ORAL_TABLET | Freq: Every day | ORAL | Status: DC | PRN

## 2019-01-20 MED ORDER — SODIUM CHLORIDE 0.9% FLUSH
3.0000 mL | Freq: Two times a day (BID) | INTRAVENOUS | Status: DC
  Administered 2019-01-20 – 2019-01-24 (×9): 3 mL via INTRAVENOUS

## 2019-01-20 MED ORDER — ONDANSETRON HCL 4 MG PO TABS: 4.0000 mg | ORAL_TABLET | Freq: Four times a day (QID) | ORAL | Status: DC | PRN

## 2019-01-20 MED ORDER — FUROSEMIDE 10 MG/ML IJ SOLN
40.0000 mg | Freq: Once | INTRAMUSCULAR | Status: AC
  Administered 2019-01-20: 40 mg via INTRAVENOUS
  Filled 2019-01-20: qty 4

## 2019-01-20 MED ORDER — NITROGLYCERIN 0.4 MG SL SUBL: 0.4000 mg | SUBLINGUAL_TABLET | SUBLINGUAL | Status: DC | PRN

## 2019-01-20 MED ORDER — SODIUM CHLORIDE 0.9 % IV SOLN: 250.0000 mL | INTRAVENOUS | Status: DC | PRN

## 2019-01-20 MED ORDER — WARFARIN - PHARMACIST DOSING INPATIENT
Freq: Every day | Status: DC
  Administered 2019-01-22: 1

## 2019-01-20 NOTE — Plan of Care (Signed)

## 2019-01-20 NOTE — ED Notes (Signed)
Pt back from x-ray.

## 2019-01-20 NOTE — Progress Notes (Signed)
LE venous duplex       has been completed. Preliminary results can be found under CV proc through chart review. Apollonia Amini, BS, RDMS, RVT   

## 2019-01-20 NOTE — H&P (Signed)
[facility]                                                                                    Patient Demographics  [name], is a 85 y.o. female  CSN: [id number]  MRN: [id number]  DOB - January 01, 1927  Admit Date - 01/20/2019  Outpatient Primary MD for the patient is Colon Branch, MD   With History of -  Past Medical History:  Diagnosis Date  . Acute encephalopathy   . Acute renal failure superimposed on stage 3 chronic kidney disease ([place])   . Anemia   . Atrial fibrillation ([place])   . Colon cancer ([place]) 12/11  . Colon cancer ([place]) 2011  . Diarrhea   . DVT (deep venous thrombosis) ([place]) 11/06  . Elevated brain natriuretic peptide (BNP) level   . Frequent headaches   . Heart murmur   . Hyperkalemia   . Hypertension   . Pulmonary embolism ([place]) 11/06  . Seizures ([place])   . Stroke [facility]) 11/30/2014      Past Surgical History:  Procedure Laterality Date  . APPENDECTOMY  1952  . COLECTOMY  06/2010   partial, Dr. Donne Hazel complicated by LLE CVT; S/P IVC umbrella & anemia  . HIP FRACTURE SURGERY  2006   Trauma  . TOTAL ABDOMINAL HYSTERECTOMY W/ BILATERAL SALPINGOOPHORECTOMY  1973   For Fibroids  . TOTAL HIP ARTHROPLASTY  01/03/07   Left hip replacement.  Marland Kitchen VENA CAVA FILTER PLACEMENT  06/28/2010    in for   Chief Complaint  Patient presents with  . Recurrent UTI     HPI  [name]  is a 85 y.o. female, with past medical history significant for chronic kidney disease stage III, diastolic congestive heart failure and chronic A. fib, presenting with worsening shortness of breath with decreased urinary output.  Patient denies any chest pains, fever, chills.  Daughter reports worsening altered mental status and fluid retention with increase in her lower extremity edema.  Patient is on warfarin for DVTs and atrial fibrillation.  Daughter reported that she is having difficulty taking care of her mom. Denies being exposed to any patient with COVID, fever  chills or respiratory failure.    Review of Systems    Patient denies any nausea, vomiting, diarrhea.  Denies any chest pains, denies any fever chills. Patient reports lower extremity edema worsening No history of dizziness or loss of consciousness Reports decreased urination No cough or sputum production No new focal weaknesses No headache dizziness or visual problems No skin rashes or ulcers Other systems were reviewed and were negative except for the worsening shortness of breath and decreased urination   Social History Social History   Tobacco Use  . Smoking status: Passive Smoke Exposure - Never Smoker  . Smokeless tobacco: Never Used  . Tobacco comment: husband smoked in home.   Substance Use Topics  . Alcohol use: No    Alcohol/week: 0.0 standard drinks     Family History Family History  Problem Relation Age of Onset  . Peripheral vascular disease Father   . Heart attack Mother 22  . Heart disease Mother  MI  . Coronary artery disease Sister   . Diabetes Paternal Grandmother   . Coronary artery disease Maternal Grandfather      Prior to Admission medications   Medication Sig Start Date End Date Taking? Authorizing Provider  acetaminophen (TYLENOL) 500 MG tablet Take 1,000 mg by mouth every 8 (eight) hours as needed for headache (pain).    Yes [provider]  albuterol (PROVENTIL) (2.5 MG/3ML) 0.083% nebulizer solution Take 2.5 mg by nebulization every 6 (six) hours as needed for wheezing or shortness of breath.   Yes [provider]  aspirin EC 81 MG tablet Take 81 mg by mouth daily.   Yes [provider]  atorvastatin (LIPITOR) 20 MG tablet Take 1 tablet (20 mg total) by mouth daily. 10/07/18  Yes Paz, [name], MD  calcium-vitamin D (OSCAL WITH D) 500-200 MG-UNIT tablet Take 1 tablet by mouth daily with breakfast. 07/21/18  Yes [name], MD  ENSURE (ENSURE) Take 237 mLs by mouth daily with breakfast. Milk chocolate   Yes  [provider]  Fluticasone-Salmeterol (WIXELA INHUB) 100-50 MCG/DOSE AEPB Inhale 1 puff into the lungs 2 (two) times daily. 07/28/18  Yes Paz, [name], MD  furosemide (LASIX) 80 MG tablet Take 1 tablet (80 mg total) by mouth daily. 11/21/18  Yes Burnell Blanks, MD  isosorbide mononitrate (IMDUR) 30 MG 24 hr tablet Take 0.5 tablets (15 mg total) by mouth daily. 08/22/18  Yes Paz, [name], MD  loratadine (CLARITIN) 10 MG tablet Take 1 tablet (10 mg total) by mouth daily as needed (seasonal allergies). 07/21/18  Yes [name], MD  metoprolol tartrate (LOPRESSOR) 25 MG tablet Take 1.5 tablets (37.5 mg total) by mouth 2 (two) times daily. 01/09/19  Yes Paz, [name], MD  Multiple Vitamin (MULTIVITAMIN WITH MINERALS) TABS tablet Take 1 tablet by mouth daily. 06/06/18  Yes Mercy Riding, MD  nitroGLYCERIN (NITROSTAT) 0.4 MG SL tablet Place 1 tablet (0.4 mg total) under the tongue every 5 (five) minutes as needed for chest pain. 07/21/18  Yes [name], MD  polyethylene glycol [facility] / Floria Raveling) packet Take 17 g by mouth daily as needed. 07/21/18  Yes [name], MD  PROAIR HFA 108 [phone] Base) MCG/ACT inhaler Inhale 2 puffs into the lungs every 6 (six) hours as needed for wheezing or shortness of breath. 12/23/18  Yes Paz, [name], MD  QUEtiapine (SEROQUEL) 25 MG tablet Take 1 tablet (25 mg total) by mouth at bedtime. 11/11/18  Yes Paz, [name], MD  verapamil (CALAN-SR) 120 MG CR tablet Take 1 tablet (120 mg total) by mouth at bedtime. 08/22/18  Yes Colon Branch, MD  warfarin (COUMADIN) 2.5 MG tablet USE AS DIRECTED BY COUMADIN CLINIC 10/17/18  Yes Paz, [name], MD  cephALEXin (KEFLEX) 500 MG capsule Take 1 capsule (500 mg total) by mouth 3 (three) times daily. Patient not taking: Reported on 01/20/2019 12/16/18   Colon Branch, MD  promethazine (PHENERGAN) 12.5 MG tablet Take 1 tablet (12.5 mg total) by mouth 3 (three) times daily as needed for nausea or vomiting. Patient not taking:  Reported on 12/10/2018 07/21/18   [name], MD  traMADol (ULTRAM) 50 MG tablet Take 1 tablet (50 mg total) by mouth every 6 (six) hours as needed for moderate pain. Patient not taking: Reported on 12/10/2018 07/21/18   [name], MD    No Known Allergies  Physical Exam  Vitals  Blood pressure 139/74,  pulse 97, temperature 97.8 F (36.6 C), temperature source Oral, resp. rate (!) 22, height 5\' 3"  (1.6 m), weight 77.1 kg, SpO2 97 %.   General appearance, no acute distress, very pleasant HEENT no jaundice or pallor, no facial deviation noted, poor dental hygiene Neck supple, no neck vein distention noted Chest decreased breath sounds at the bases with mild crackles Heart irregular irregular with systolic murmur heard at the fifth sternal border Abdomen soft, nontender Extremities no clubbing or cyanosis but lower extremity edema noted Skin no rashes or ulcers noted Neuro patient moving all extremities symmetrically  Data Review  CBC Recent Labs  Lab 01/20/19 0918  WBC 4.6  HGB 12.0  HCT 38.8  PLT 76*  MCV 97.5  MCH 30.2  MCHC 30.9  RDW 15.4  LYMPHSABS 0.8  MONOABS 0.5  EOSABS 0.1  BASOSABS 0.0   ------------------------------------------------------------------------------------------------------------------  Chemistries  Recent Labs  Lab 01/20/19 0918  NA 145  K 3.8  CL 107  CO2 26  GLUCOSE 106*  BUN 30*  CREATININE 2.13*  CALCIUM 9.4  AST 25  ALT 15  ALKPHOS 41  BILITOT 0.7   ------------------------------------------------------------------------------------------------------------------ estimated creatinine clearance is 17.3 mL/min (A) (by C-G formula based on SCr of 2.13 mg/dL (H)). ------------------------------------------------------------------------------------------------------------------ No results for input(s): TSH, T4TOTAL, T3FREE, THYROIDAB in the last 72 hours.  Invalid input(s): FREET3   Coagulation profile Recent Labs   Lab 01/20/19 0934  INR 3.7*   ------------------------------------------------------------------------------------------------------------------- No results for input(s): DDIMER in the last 72 hours. -------------------------------------------------------------------------------------------------------------------  Cardiac Enzymes No results for input(s): CKMB, TROPONINI, MYOGLOBIN in the last 168 hours.  Invalid input(s): CK ------------------------------------------------------------------------------------------------------------------ Invalid input(s): POCBNP   ---------------------------------------------------------------------------------------------------------------  Urinalysis    Component Value Date/Time   COLORURINE YELLOW 01/20/2019 0918   APPEARANCEUR CLEAR 01/20/2019 0918   APPEARANCEUR Cloudy (A) 01/13/2018 1619   LABSPEC 1.008 01/20/2019 0918   PHURINE 7.0 01/20/2019 0918   GLUCOSEU NEGATIVE 01/20/2019 0918   GLUCOSEU NEGATIVE 09/28/2018 0939   HGBUR NEGATIVE 01/20/2019 0918   BILIRUBINUR NEGATIVE 01/20/2019 0918   BILIRUBINUR Negative 01/13/2018 1619   KETONESUR NEGATIVE 01/20/2019 0918   PROTEINUR NEGATIVE 01/20/2019 0918   UROBILINOGEN 0.2 09/28/2018 0939   NITRITE NEGATIVE 01/20/2019 0918   LEUKOCYTESUR NEGATIVE 01/20/2019 0918    ----------------------------------------------------------------------------------------------------------------   Imaging results:   Dg Chest 2 View  Result Date: 01/20/2019 CLINICAL DATA:  Hip pain. No reported chest symptoms. EXAM: CHEST - 2 VIEW COMPARISON:  06/01/2018. FINDINGS: Progressive enlargement of the cardiac silhouette. Mild increase in prominence of the pulmonary vasculature and interstitial markings. Interval minimal left pleural thickening or fluid. The inferior posterior costophrenic angles are not included on the lateral view. There is minimal patchy opacity at the posterior lung bases on that view.  Thoracic spine degenerative changes. Diffuse osteopenia. Superior migration of both humeral heads, compatible with chronic rotator cuff tears. IMPRESSION: 1. Progressive cardiomegaly and minimal changes of congestive heart failure. 2. Minimal patchy atelectasis or pneumonia at the posterior lung bases. Electronically Signed   By: Claudie Revering M.D.   On: 01/20/2019 10:45   Dg Hip Unilat W Or Wo Pelvis 2-3 Views Left  Result Date: 01/20/2019 CLINICAL DATA:  Lateral left hip pain. No known injury. EXAM: DG HIP (WITH OR WITHOUT PELVIS) 2-3V LEFT COMPARISON:  Right hip dated 05/25/2018. FINDINGS: Stable left hip total prosthesis, diffuse osteopenia and lower thoracic spine degenerative changes. No fracture or dislocation seen and no evidence of prosthetic loosening. Left acetabula protrusio is unchanged. Unremarkable right hip.  Atheromatous arterial calcifications. IMPRESSION: 1. No acute abnormality. 2. Hardware intact. Electronically Signed   By: Claudie Revering M.D.   On: 01/20/2019 10:42   Vas [place] Lower Extremity Venous (dvt) (mc And Wl 7a-7p)  Result Date: 01/20/2019  Lower Venous Study Indications: Edema.  Comparison Study: 09/17/15 negative Performing Technologist: June Leap RDMS, RVT  Examination Guidelines: A complete evaluation includes B-mode imaging, spectral Doppler, color Doppler, and power Doppler as needed of all accessible portions of each vessel. Bilateral testing is considered an integral part of a complete examination. Limited examinations for reoccurring indications may be performed as noted.  +---------+---------------+---------+-----------+----------+-------+ RIGHT    CompressibilityPhasicitySpontaneityPropertiesSummary +---------+---------------+---------+-----------+----------+-------+ CFV      Full           Yes      Yes                          +---------+---------------+---------+-----------+----------+-------+ SFJ      Full                                                  +---------+---------------+---------+-----------+----------+-------+ FV Prox  Full                                                 +---------+---------------+---------+-----------+----------+-------+ FV Mid   Full                                                 +---------+---------------+---------+-----------+----------+-------+ FV DistalFull                                                 +---------+---------------+---------+-----------+----------+-------+ PFV      Full                                                 +---------+---------------+---------+-----------+----------+-------+ POP      Full           Yes      Yes                          +---------+---------------+---------+-----------+----------+-------+ PTV      Full                                                 +---------+---------------+---------+-----------+----------+-------+ PERO     Full                                                 +---------+---------------+---------+-----------+----------+-------+   +---------+---------------+---------+-----------+----------+-------+ LEFT  CompressibilityPhasicitySpontaneityPropertiesSummary +---------+---------------+---------+-----------+----------+-------+ CFV      Full           Yes      Yes                          +---------+---------------+---------+-----------+----------+-------+ SFJ      Full                                                 +---------+---------------+---------+-----------+----------+-------+ FV Prox  Full                                                 +---------+---------------+---------+-----------+----------+-------+ FV Mid   Full                                                 +---------+---------------+---------+-----------+----------+-------+ FV DistalFull                                                 +---------+---------------+---------+-----------+----------+-------+ PFV       Full                                                 +---------+---------------+---------+-----------+----------+-------+ POP      Full           Yes      Yes                          +---------+---------------+---------+-----------+----------+-------+ PTV      Full                                                 +---------+---------------+---------+-----------+----------+-------+ PERO     Full                                                 +---------+---------------+---------+-----------+----------+-------+     Summary: Right: There is no evidence of deep vein thrombosis in the lower extremity. No cystic structure found in the popliteal fossa. Left: There is no evidence of deep vein thrombosis in the lower extremity. No cystic structure found in the popliteal fossa.  *See table(s) above for measurements and observations.    Preliminary     My personal review of EKG: Atrial fibrillation at 86 bpm with nonspecific IVCD and LVH    Assessment & Plan  Acute diastolic congestive heart failure , elevated BNP with no acute EKG changes Last echocardiogram on 11/2017 showing ejection fraction 65% Admitted under observation at telemetry Change Lasix to IV Check  echocardiogram Check serial troponins  History of pulmonary embolism Continue with warfarin  Chronic atrial fibrillation Continue with beta-blocker Continue with warfarin  Chronic kidney disease with acute component, stage III  Placement issues Family unable to take care of her at this time Education officer, museum consulted  History of thrombocytopenia Last platelets are 76,000, stable around this level  Dyslipidemia Continue with Lipitor  History of thrombotic CVA/transient cerebral ischemia Continue with aspirin and Lipitor  Element of urinary retention Flomax/Foley Accurate intake output     DVT Prophylaxis Coumadin  AM Labs Ordered, also please review Full Orders  Family Communication: Daughter is  the power of attorney, discussed with ER physician.  Code Status full  Disposition Plan: To be determined  Time spent in minutes : 42 minutes  Condition GUARDED   @SIGNATURE @

## 2019-01-20 NOTE — ED Notes (Signed)
Patient transported to X-ray 

## 2019-01-20 NOTE — Progress Notes (Signed)
ANTICOAGULATION CONSULT NOTE - Initial Consult  Pharmacy Consult for warfarin Indication: atrial fibrillation and hx VTE  No Known Allergies  Patient Measurements: Height: 5\' 3"  (160 cm) Weight: 170 lb (77.1 kg) IBW/kg (Calculated) : 52.4  Vital Signs: Temp: 97.8 F (36.6 C) (07/17 0902) Temp Source: Oral (07/17 0902) BP: 139/74 (07/17 1159) Pulse Rate: 97 (07/17 1159)  Labs: Recent Labs    01/20/19 0918 01/20/19 0934  HGB 12.0  --   HCT 38.8  --   PLT 76*  --   LABPROT  --  36.4*  INR  --  3.7*  CREATININE 2.13*  --     Estimated Creatinine Clearance: 17.3 mL/min (A) (by C-G formula based on SCr of 2.13 mg/dL (H)).   Medical History: Past Medical History:  Diagnosis Date  . Acute encephalopathy   . Acute renal failure superimposed on stage 3 chronic kidney disease ([place])   . Anemia   . Atrial fibrillation ([place])   . Colon cancer ([place]) 12/11  . Colon cancer ([place]) 2011  . Diarrhea   . DVT (deep venous thrombosis) ([place]) 11/06  . Elevated brain natriuretic peptide (BNP) level   . Frequent headaches   . Heart murmur   . Hyperkalemia   . Hypertension   . Pulmonary embolism ([place]) 11/06  . Seizures ([place])   . Stroke [facility]) 11/30/2014   Assessment: 84 yof presented to the ED with concerns for a recurrent UTI. She is on chronic warfarin for history of afib and VTE. INR is supratherapeutic today at 3.7. No bleeding noted. Hgb is WNL and platelets are low but this appears to be patients baseline.   Goal of Therapy:  INR 2-3 Monitor platelets by anticoagulation protocol: Yes   Plan:  No warfarin tonight Daily INR  Erlin Gardella, Rande Lawman 01/20/2019,12:47 PM

## 2019-01-20 NOTE — ED Provider Notes (Signed)
[facility] EMERGENCY DEPARTMENT Provider Note   CSN: [id number] Arrival date & time: 01/20/19  4627    History   Chief Complaint Chief Complaint  Patient presents with  . Recurrent UTI    HPI [name] is a 85 y.o. female.     HPI  85 year old female presents with decreased urination and concern for UTI.  Has been having decreased urine output for the last few days.  No dysuria, abdominal pain or flank pain.  A little nauseated currently though she thinks that was from the EMS ride.  She endorses some on and off left lateral hip pain as well though no injuries.  Currently not present.  I talked to the daughter over the phone who endorses chronic confusion but a little bit worse whenever she has symptoms like this. She has been drinking less.  Also worried the patient is accumulating fluid despite being on Lasix due to lower extremity edema.  has been short of breath when walking. History of previous DVTs and is on warfarin.  Try to get a urine sample this morning for the doctor office but could not pee.   Past Medical History:  Diagnosis Date  . Acute encephalopathy   . Acute renal failure superimposed on stage 3 chronic kidney disease ([place])   . Anemia   . Atrial fibrillation ([place])   . Colon cancer ([place]) 12/11  . Colon cancer ([place]) 2011  . Diarrhea   . DVT (deep venous thrombosis) ([place]) 11/06  . Elevated brain natriuretic peptide (BNP) level   . Frequent headaches   . Heart murmur   . Hyperkalemia   . Hypertension   . Pulmonary embolism ([place]) 11/06  . Seizures ([place])   . Stroke [facility]) 11/30/2014    Patient Active Problem List   Diagnosis Date Noted  . Congestive heart failure (CHF) ([place]) 01/20/2019  . Right leg weakness 07/21/2018  . E. coli UTI 07/21/2018  . Acute kidney injury superimposed on chronic kidney disease ([place]) 07/21/2018  . CAD (coronary artery disease) 06/07/2018  . Chronic diastolic (congestive) heart failure ([place]) 06/07/2018  .  Pulmonary hypertension, primary ([place]) 06/07/2018  . Generalized weakness   . Abnormal LFTs 06/01/2018  . Hallucination 06/01/2018  . Abnormal chest x-ray 09/28/2015  . Edema of right lower extremity 09/28/2015  . Elevated brain natriuretic peptide (BNP) level 09/17/2015  . Seizures ([place]) 09/16/2015  . Epistaxis 09/07/2015  . Asthma 08/14/2015  . PCP NOTES >>>>>>>>>>>>>>>>>>>>>>>>>>>>>> 04/24/2015  . TIA (transient ischemic attack) 12/11/2014  . Dyslipidemia 12/11/2014  . History of CVA (cerebrovascular accident) 12/01/2014  . Thrombocytopenia ([place]) 12/01/2014  . Chronic renal insufficiency, stage II (mild) 11/12/2013  . Impingement syndrome, shoulder, left 11/09/2013  . Acquired short bowel syndrome 11/09/2013  . Annual physical exam 08/30/2013  . Hx pulmonary embolism 06/27/2013  . Warfarin anticoagulation 06/27/2013  . DDD (degenerative disc disease), lumbar 11/18/2012  . Mitral regurgitation 08/26/2011  . DYSPHAGIA UNSPECIFIED 07/31/2010  . ATRIAL FIBRILLATION   07/08/2010  . COLON CANCER, HX OF 07/08/2010  . DIZZINESS 10/18/2009  . Essential hypertension 12/16/2007  . HIP PAIN, CHRONIC 12/28/2006  . ANEMIA-NOS 07/15/2006  . Personal history of venous thrombosis and embolism 07/15/2006    Past Surgical History:  Procedure Laterality Date  . APPENDECTOMY  1952  . COLECTOMY  06/2010   partial, Dr. Donne Hazel complicated by LLE CVT; S/P IVC umbrella & anemia  . HIP FRACTURE SURGERY  2006   Trauma  . TOTAL ABDOMINAL HYSTERECTOMY  W/ BILATERAL SALPINGOOPHORECTOMY  1973   For Fibroids  . TOTAL HIP ARTHROPLASTY  01/03/07   Left hip replacement.  Marland Kitchen VENA CAVA FILTER PLACEMENT  06/28/2010     OB History   No obstetric history on file.      Home Medications    Prior to Admission medications   Medication Sig Start Date End Date Taking? Authorizing Provider  acetaminophen (TYLENOL) 500 MG tablet Take 1,000 mg by mouth every 8 (eight) hours as needed for headache (pain).     Yes [provider]  albuterol (PROVENTIL) (2.5 MG/3ML) 0.083% nebulizer solution Take 2.5 mg by nebulization every 6 (six) hours as needed for wheezing or shortness of breath.   Yes [provider]  aspirin EC 81 MG tablet Take 81 mg by mouth daily.   Yes [provider]  atorvastatin (LIPITOR) 20 MG tablet Take 1 tablet (20 mg total) by mouth daily. 10/07/18  Yes Paz, [name], MD  calcium-vitamin D (OSCAL WITH D) 500-200 MG-UNIT tablet Take 1 tablet by mouth daily with breakfast. 07/21/18  Yes [name], MD  ENSURE (ENSURE) Take 237 mLs by mouth daily with breakfast. Milk chocolate   Yes [provider]  Fluticasone-Salmeterol (WIXELA INHUB) 100-50 MCG/DOSE AEPB Inhale 1 puff into the lungs 2 (two) times daily. 07/28/18  Yes Paz, [name], MD  furosemide (LASIX) 80 MG tablet Take 1 tablet (80 mg total) by mouth daily. 11/21/18  Yes Burnell Blanks, MD  isosorbide mononitrate (IMDUR) 30 MG 24 hr tablet Take 0.5 tablets (15 mg total) by mouth daily. 08/22/18  Yes Paz, [name], MD  loratadine (CLARITIN) 10 MG tablet Take 1 tablet (10 mg total) by mouth daily as needed (seasonal allergies). 07/21/18  Yes [name], MD  metoprolol tartrate (LOPRESSOR) 25 MG tablet Take 1.5 tablets (37.5 mg total) by mouth 2 (two) times daily. 01/09/19  Yes Paz, [name], MD  Multiple Vitamin (MULTIVITAMIN WITH MINERALS) TABS tablet Take 1 tablet by mouth daily. 06/06/18  Yes Mercy Riding, MD  nitroGLYCERIN (NITROSTAT) 0.4 MG SL tablet Place 1 tablet (0.4 mg total) under the tongue every 5 (five) minutes as needed for chest pain. 07/21/18  Yes [name], MD  polyethylene glycol [facility] / Floria Raveling) packet Take 17 g by mouth daily as needed. 07/21/18  Yes [name], MD  PROAIR HFA 108 [phone] Base) MCG/ACT inhaler Inhale 2 puffs into the lungs every 6 (six) hours as needed for wheezing or shortness of breath. 12/23/18  Yes Paz, [name], MD  QUEtiapine (SEROQUEL) 25 MG  tablet Take 1 tablet (25 mg total) by mouth at bedtime. 11/11/18  Yes Paz, [name], MD  verapamil (CALAN-SR) 120 MG CR tablet Take 1 tablet (120 mg total) by mouth at bedtime. 08/22/18  Yes Colon Branch, MD  warfarin (COUMADIN) 2.5 MG tablet USE AS DIRECTED BY COUMADIN CLINIC 10/17/18  Yes Paz, [name], MD  cephALEXin (KEFLEX) 500 MG capsule Take 1 capsule (500 mg total) by mouth 3 (three) times daily. Patient not taking: Reported on 01/20/2019 12/16/18   Colon Branch, MD  promethazine (PHENERGAN) 12.5 MG tablet Take 1 tablet (12.5 mg total) by mouth 3 (three) times daily as needed for nausea or vomiting. Patient not taking: Reported on 12/10/2018 07/21/18   [name], MD  traMADol (ULTRAM) 50 MG tablet Take 1 tablet (50 mg total) by mouth every 6 (six) hours as needed for moderate pain. Patient not  taking: Reported on 12/10/2018 07/21/18   [name], MD    Family History Family History  Problem Relation Age of Onset  . Peripheral vascular disease Father   . Heart attack Mother 56  . Heart disease Mother        MI  . Coronary artery disease Sister   . Diabetes Paternal Grandmother   . Coronary artery disease Maternal Grandfather     Social History Social History   Tobacco Use  . Smoking status: Passive Smoke Exposure - Never Smoker  . Smokeless tobacco: Never Used  . Tobacco comment: husband smoked in home.   Substance Use Topics  . Alcohol use: No    Alcohol/week: 0.0 standard drinks  . Drug use: No     Allergies   Patient has no known allergies.   Review of Systems Review of Systems  Constitutional: Negative for fever.  Respiratory: Negative for cough and shortness of breath.   Gastrointestinal: Negative for vomiting.  Genitourinary: Positive for decreased urine volume. Negative for dysuria.  All other systems reviewed and are negative.    Physical Exam Updated Vital Signs BP 129/75   Pulse 91   Temp 97.8 F (36.6 C) (Oral)   Resp 15   Ht 5\' 3"  (1.6 m)    Wt 77.1 kg   SpO2 94%   BMI 30.11 kg/m   Physical Exam Vitals signs and nursing note reviewed.  Constitutional:      General: She is not in acute distress.    Appearance: She is well-developed. She is not ill-appearing or diaphoretic.  HENT:     Head: Normocephalic and atraumatic.     Right Ear: External ear normal.     Left Ear: External ear normal.     Nose: Nose normal.  Eyes:     General:        Right eye: No discharge.        Left eye: No discharge.  Cardiovascular:     Rate and Rhythm: Normal rate and regular rhythm.     Heart sounds: Normal heart sounds.  Pulmonary:     Effort: Pulmonary effort is normal.     Breath sounds: Normal breath sounds.  Abdominal:     General: There is no distension.     Palpations: Abdomen is soft.     Tenderness: There is no abdominal tenderness. There is no right CVA tenderness or left CVA tenderness.  Musculoskeletal:        General: No tenderness.     Left hip: She exhibits normal range of motion and no tenderness.     Right lower leg: Edema present.  Skin:    General: Skin is warm and dry.  Neurological:     Mental Status: She is alert.  Psychiatric:        Mood and Affect: Mood is not anxious.      ED Treatments / Results  Labs (all labs ordered are listed, but only abnormal results are displayed) Labs Reviewed  COMPREHENSIVE METABOLIC PANEL - Abnormal; Notable for the following components:      Result Value   Glucose, Bld 106 (*)    BUN 30 (*)    Creatinine, Ser 2.13 (*)    Total Protein 5.9 (*)    GFR calc non Af Amer 20 (*)    GFR calc Af Amer 23 (*)    All other components within normal limits  CBC WITH DIFFERENTIAL/PLATELET - Abnormal; Notable for the following components:  Platelets 76 (*)    All other components within normal limits  PROTIME-INR - Abnormal; Notable for the following components:   Prothrombin Time 36.4 (*)    INR 3.7 (*)    All other components within normal limits  BRAIN NATRIURETIC  PEPTIDE - Abnormal; Notable for the following components:   B Natriuretic Peptide 852.0 (*)    All other components within normal limits  SARS CORONAVIRUS 2 (HOSPITAL ORDER, [facility])  URINE CULTURE  URINALYSIS, ROUTINE W REFLEX MICROSCOPIC    EKG EKG Interpretation  Date/Time:  Friday January 20 2019 10:43:00 EDT Ventricular Rate:  86 PR Interval:    QRS Duration: 121 QT Interval:  415 QTC Calculation: 497 R Axis:   -48 Text Interpretation:  Atrial fibrillation Nonspecific IVCD with LAD LVH with secondary repolarization abnormality Baseline wander in lead(s) II III aVF Confirmed by Sherwood Gambler [phone]) on 01/20/2019 10:58:20 AM   Radiology Dg Chest 2 View  Result Date: 01/20/2019 CLINICAL DATA:  Hip pain. No reported chest symptoms. EXAM: CHEST - 2 VIEW COMPARISON:  06/01/2018. FINDINGS: Progressive enlargement of the cardiac silhouette. Mild increase in prominence of the pulmonary vasculature and interstitial markings. Interval minimal left pleural thickening or fluid. The inferior posterior costophrenic angles are not included on the lateral view. There is minimal patchy opacity at the posterior lung bases on that view. Thoracic spine degenerative changes. Diffuse osteopenia. Superior migration of both humeral heads, compatible with chronic rotator cuff tears. IMPRESSION: 1. Progressive cardiomegaly and minimal changes of congestive heart failure. 2. Minimal patchy atelectasis or pneumonia at the posterior lung bases. Electronically Signed   By: Claudie Revering M.D.   On: 01/20/2019 10:45   Dg Hip Unilat W Or Wo Pelvis 2-3 Views Left  Result Date: 01/20/2019 CLINICAL DATA:  Lateral left hip pain. No known injury. EXAM: DG HIP (WITH OR WITHOUT PELVIS) 2-3V LEFT COMPARISON:  Right hip dated 05/25/2018. FINDINGS: Stable left hip total prosthesis, diffuse osteopenia and lower thoracic spine degenerative changes. No fracture or dislocation seen and no evidence of  prosthetic loosening. Left acetabula protrusio is unchanged. Unremarkable right hip. Atheromatous arterial calcifications. IMPRESSION: 1. No acute abnormality. 2. Hardware intact. Electronically Signed   By: Claudie Revering M.D.   On: 01/20/2019 10:42   Vas [place] Lower Extremity Venous (dvt) (mc And Wl 7a-7p)  Result Date: 01/20/2019  Lower Venous Study Indications: Edema.  Comparison Study: 09/17/15 negative Performing Technologist: June Leap RDMS, RVT  Examination Guidelines: A complete evaluation includes B-mode imaging, spectral Doppler, color Doppler, and power Doppler as needed of all accessible portions of each vessel. Bilateral testing is considered an integral part of a complete examination. Limited examinations for reoccurring indications may be performed as noted.  +---------+---------------+---------+-----------+----------+-------+ RIGHT    CompressibilityPhasicitySpontaneityPropertiesSummary +---------+---------------+---------+-----------+----------+-------+ CFV      Full           Yes      Yes                          +---------+---------------+---------+-----------+----------+-------+ SFJ      Full                                                 +---------+---------------+---------+-----------+----------+-------+ FV Prox  Full                                                 +---------+---------------+---------+-----------+----------+-------+  FV Mid   Full                                                 +---------+---------------+---------+-----------+----------+-------+ FV DistalFull                                                 +---------+---------------+---------+-----------+----------+-------+ PFV      Full                                                 +---------+---------------+---------+-----------+----------+-------+ POP      Full           Yes      Yes                           +---------+---------------+---------+-----------+----------+-------+ PTV      Full                                                 +---------+---------------+---------+-----------+----------+-------+ PERO     Full                                                 +---------+---------------+---------+-----------+----------+-------+   +---------+---------------+---------+-----------+----------+-------+ LEFT     CompressibilityPhasicitySpontaneityPropertiesSummary +---------+---------------+---------+-----------+----------+-------+ CFV      Full           Yes      Yes                          +---------+---------------+---------+-----------+----------+-------+ SFJ      Full                                                 +---------+---------------+---------+-----------+----------+-------+ FV Prox  Full                                                 +---------+---------------+---------+-----------+----------+-------+ FV Mid   Full                                                 +---------+---------------+---------+-----------+----------+-------+ FV DistalFull                                                 +---------+---------------+---------+-----------+----------+-------+ PFV  Full                                                 +---------+---------------+---------+-----------+----------+-------+ POP      Full           Yes      Yes                          +---------+---------------+---------+-----------+----------+-------+ PTV      Full                                                 +---------+---------------+---------+-----------+----------+-------+ PERO     Full                                                 +---------+---------------+---------+-----------+----------+-------+     Summary: Right: There is no evidence of deep vein thrombosis in the lower extremity. No cystic structure found in the popliteal fossa. Left: There is  no evidence of deep vein thrombosis in the lower extremity. No cystic structure found in the popliteal fossa.  *See table(s) above for measurements and observations. Electronically signed by Monica Martinez MD on 01/20/2019 at 1:37:00 PM.    Final     Procedures Procedures (including critical care time)  Medications Ordered in ED Medications  Warfarin - Pharmacist Dosing Inpatient (has no administration in time range)  sodium chloride 0.9 % bolus 500 mL (500 mLs Intravenous New Bag/Given 01/20/19 1154)  furosemide (LASIX) injection 40 mg (40 mg Intravenous Given 01/20/19 1228)     Initial Impression / Assessment and Plan / ED Course  I have reviewed the triage vital signs and the nursing notes.  Pertinent labs & imaging results that were available during my care of the patient were reviewed by me and considered in my medical decision making (see chart for details).        No UTI seen but the patient does have urinary retention and a Foley catheter was placed.  Mildly bumped creatinine is probably from the retention.  She does appear to have some pulmonary edema and CHF and will give some IV Lasix.  Daughter is very worried about her coming home given generalized weakness and so I think is reasonable to admit for observation for diuresis and physical therapy.  [name] was evaluated in Emergency Department on 01/20/2019 for the symptoms described in the history of present illness. She was evaluated in the context of the global COVID-19 pandemic, which necessitated consideration that the patient might be at risk for infection with the SARS-CoV-2 virus that causes COVID-19. Institutional protocols and algorithms that pertain to the evaluation of patients at risk for COVID-19 are in a state of rapid change based on information released by regulatory bodies including the CDC and federal and state organizations. These policies and algorithms were followed during the patient's care in the ED.    Final Clinical Impressions(s) / ED Diagnoses   Final diagnoses:  Urinary retention  Acute on chronic congestive heart failure, unspecified heart failure type ([place])  ED Discharge Orders    None       Sherwood Gambler, MD 01/20/19 1520

## 2019-01-20 NOTE — ED Triage Notes (Signed)
Pt here from home with c/o recurrent UTI, pt states that she has not been putting out much urine and been going often , pt has history of same ,

## 2019-01-20 NOTE — ED Notes (Signed)
Pt daughter provided with updated on inpatient bed and updated number to contact the hospital

## 2019-01-21 ENCOUNTER — Observation Stay (HOSPITAL_BASED_OUTPATIENT_CLINIC_OR_DEPARTMENT_OTHER): Payer: Medicare Other

## 2019-01-21 DIAGNOSIS — Z87440 Personal history of urinary (tract) infections: Secondary | ICD-10-CM | POA: Diagnosis not present

## 2019-01-21 DIAGNOSIS — D696 Thrombocytopenia, unspecified: Secondary | ICD-10-CM | POA: Diagnosis present

## 2019-01-21 DIAGNOSIS — Z8673 Personal history of transient ischemic attack (TIA), and cerebral infarction without residual deficits: Secondary | ICD-10-CM | POA: Diagnosis not present

## 2019-01-21 DIAGNOSIS — R52 Pain, unspecified: Secondary | ICD-10-CM

## 2019-01-21 DIAGNOSIS — I509 Heart failure, unspecified: Secondary | ICD-10-CM

## 2019-01-21 DIAGNOSIS — I340 Nonrheumatic mitral (valve) insufficiency: Secondary | ICD-10-CM | POA: Diagnosis not present

## 2019-01-21 DIAGNOSIS — N183 Chronic kidney disease, stage 3 (moderate): Secondary | ICD-10-CM | POA: Diagnosis present

## 2019-01-21 DIAGNOSIS — Z7901 Long term (current) use of anticoagulants: Secondary | ICD-10-CM | POA: Diagnosis not present

## 2019-01-21 DIAGNOSIS — I5033 Acute on chronic diastolic (congestive) heart failure: Secondary | ICD-10-CM | POA: Diagnosis present

## 2019-01-21 DIAGNOSIS — Z96642 Presence of left artificial hip joint: Secondary | ICD-10-CM | POA: Diagnosis present

## 2019-01-21 DIAGNOSIS — I361 Nonrheumatic tricuspid (valve) insufficiency: Secondary | ICD-10-CM | POA: Diagnosis not present

## 2019-01-21 DIAGNOSIS — Z7722 Contact with and (suspected) exposure to environmental tobacco smoke (acute) (chronic): Secondary | ICD-10-CM | POA: Diagnosis present

## 2019-01-21 DIAGNOSIS — Z90710 Acquired absence of both cervix and uterus: Secondary | ICD-10-CM | POA: Diagnosis not present

## 2019-01-21 DIAGNOSIS — R339 Retention of urine, unspecified: Secondary | ICD-10-CM

## 2019-01-21 DIAGNOSIS — Z86718 Personal history of other venous thrombosis and embolism: Secondary | ICD-10-CM | POA: Diagnosis not present

## 2019-01-21 DIAGNOSIS — R4182 Altered mental status, unspecified: Secondary | ICD-10-CM | POA: Diagnosis not present

## 2019-01-21 DIAGNOSIS — I4891 Unspecified atrial fibrillation: Secondary | ICD-10-CM | POA: Diagnosis not present

## 2019-01-21 DIAGNOSIS — G9340 Encephalopathy, unspecified: Secondary | ICD-10-CM | POA: Diagnosis present

## 2019-01-21 DIAGNOSIS — Z85038 Personal history of other malignant neoplasm of large intestine: Secondary | ICD-10-CM | POA: Diagnosis not present

## 2019-01-21 DIAGNOSIS — I5030 Unspecified diastolic (congestive) heart failure: Secondary | ICD-10-CM | POA: Diagnosis not present

## 2019-01-21 DIAGNOSIS — N179 Acute kidney failure, unspecified: Secondary | ICD-10-CM | POA: Diagnosis present

## 2019-01-21 DIAGNOSIS — Z833 Family history of diabetes mellitus: Secondary | ICD-10-CM | POA: Diagnosis not present

## 2019-01-21 DIAGNOSIS — Z86711 Personal history of pulmonary embolism: Secondary | ICD-10-CM | POA: Diagnosis not present

## 2019-01-21 DIAGNOSIS — Z1159 Encounter for screening for other viral diseases: Secondary | ICD-10-CM | POA: Diagnosis not present

## 2019-01-21 DIAGNOSIS — I130 Hypertensive heart and chronic kidney disease with heart failure and stage 1 through stage 4 chronic kidney disease, or unspecified chronic kidney disease: Secondary | ICD-10-CM | POA: Diagnosis present

## 2019-01-21 DIAGNOSIS — R609 Edema, unspecified: Secondary | ICD-10-CM | POA: Diagnosis not present

## 2019-01-21 DIAGNOSIS — R5381 Other malaise: Secondary | ICD-10-CM | POA: Diagnosis not present

## 2019-01-21 DIAGNOSIS — Z7401 Bed confinement status: Secondary | ICD-10-CM | POA: Diagnosis not present

## 2019-01-21 DIAGNOSIS — E785 Hyperlipidemia, unspecified: Secondary | ICD-10-CM | POA: Diagnosis present

## 2019-01-21 DIAGNOSIS — M2550 Pain in unspecified joint: Secondary | ICD-10-CM | POA: Diagnosis not present

## 2019-01-21 DIAGNOSIS — D649 Anemia, unspecified: Secondary | ICD-10-CM | POA: Diagnosis present

## 2019-01-21 DIAGNOSIS — Z8249 Family history of ischemic heart disease and other diseases of the circulatory system: Secondary | ICD-10-CM | POA: Diagnosis not present

## 2019-01-21 DIAGNOSIS — E876 Hypokalemia: Secondary | ICD-10-CM | POA: Diagnosis present

## 2019-01-21 DIAGNOSIS — M25552 Pain in left hip: Secondary | ICD-10-CM | POA: Diagnosis present

## 2019-01-21 DIAGNOSIS — I482 Chronic atrial fibrillation, unspecified: Secondary | ICD-10-CM | POA: Diagnosis present

## 2019-01-21 LAB — BASIC METABOLIC PANEL
Anion gap: 9 (ref 5–15)
BUN: 27 mg/dL — ABNORMAL HIGH (ref 8–23)
CO2: 28 mmol/L (ref 22–32)
Calcium: 8.8 mg/dL — ABNORMAL LOW (ref 8.9–10.3)
Chloride: 107 mmol/L (ref 98–111)
Creatinine, Ser: 1.56 mg/dL — ABNORMAL HIGH (ref 0.44–1.00)
GFR calc Af Amer: 34 mL/min — ABNORMAL LOW (ref >60)
GFR calc non Af Amer: 29 mL/min — ABNORMAL LOW (ref >60)
Glucose, Bld: 95 mg/dL (ref 70–99)
Potassium: 3.5 mmol/L (ref 3.5–5.1)
Sodium: 144 mmol/L (ref 135–145)

## 2019-01-21 LAB — URINE CULTURE

## 2019-01-21 LAB — ECHOCARDIOGRAM COMPLETE
Height: 63 in
Weight: 2772.8 oz

## 2019-01-21 LAB — PROTIME-INR
INR: 4 — ABNORMAL HIGH (ref 0.8–1.2)
Prothrombin Time: 38.2 seconds — ABNORMAL HIGH (ref 11.4–15.2)

## 2019-01-21 MED ORDER — TRAMADOL HCL 50 MG PO TABS
50.0000 mg | ORAL_TABLET | Freq: Once | ORAL | Status: AC
  Administered 2019-01-21: 50 mg via ORAL
  Filled 2019-01-21: qty 1

## 2019-01-21 NOTE — Progress Notes (Addendum)
PROGRESS NOTE  [name] VZD:[id number] DOB: December 10, 1926 DOA: 01/20/2019 PCP: Colon Branch, MD   LOS: 0 days   Brief narrative: [name]  is a 85 y.o. female, with past medical history significant for chronic kidney disease stage III, diastolic congestive heart failure and chronic A. fib, presenting with worsening shortness of breath with decreased urinary output.  Patient denies any chest pains, fever, chills.  Daughter reports worsening altered mental status and fluid retention with increase in her lower extremity edema.  Patient is on warfarin for DVTs and atrial fibrillation.  Daughter reported that she is having difficulty taking care of her mom. Denies being exposed to any patient with COVID, fever chills or respiratory failure.  Assessment/Plan:  Active Problems:   Congestive heart failure (CHF) (HCC)  Acute diastolic congestive heart failure , elevated BNP with no acute EKG changes. Last echocardiogram on 11/2017 showing ejection fraction 65%.  Continue to telemetry monitor.  Patient feels a little better today with IV Lasix with some diuresis.  She still feels dyspneic and short of breath and will need further IV diuretics.  BNP elevated at 852.  Will closely monitor renal function on diuretics.  Actually her creatinine has improved with diuresis.  COVID 19 test was negative.  Chest x-ray reviewed by me shows evidence of pulmonary vascular congestion.  Continue CHF protocol with strict intake and output charting, daily weights.  Patient is 3000 mL negative balance since admission.  History of pulmonary embolism. Continue with warfarin.  INR is 4.0 today.  Pharmacy to dose Coumadin.  Chronic atrial fibrillation Continue beta-blocker and Coumadin.  Currently rate controlled  Acute kidney injury on chronic kidney disease stage III.  Renal function has slightly improved today likely there is component of cardiorenal syndrome.  Continue to monitor renal function on  diuretics.  Deconditioning, debility.  Family has indicated that they were unable to take care of her at home.  We will get physical therapy optional therapy evaluation to assess for rehabilitation.  Social workers have been consulted.   thrombocytopenia.  Will closely monitor.  Monitor for bleeding.  Dyslipidemia. Continue with Lipitor  History of thrombotic CVA/transient cerebral ischemia. Continue with aspirin and Lipitor  History of urinary retention. Flomax/Foley for accurate intake and output charting.   VTE Prophylaxis: Coumadin  Code Status: Full code  Family Communication: I spoke with the patient's daughter on the phone and updated her about the clinical condition of the patient.  She stated that depending upon physical therapy evaluation, she is okay for her mom to go to a rehabilitation facility.  Disposition Plan: PT, OT evaluation pending, likely to skilled nursing facility versus Home PT.  Patient will need continued monitoring, IV diuretics for adequate diuresis, PT OT evaluation and further monitoring in the hospital.  At this time, we will change her status to inpatient.   Consultants:   None  Procedures:  None  Antibiotics: Anti-infectives (From admission, onward)   None     Subjective: Today, she still has some dyspnea and shortness of breath but has improved compared to yesterday.  She stated her leg swelling has improved as well.  Denies any cough, fever, chills or rigor.  Objective: Vitals:   01/21/19 0128 01/21/19 0425  BP: (!) 128/55 127/63  Pulse: 74 94  Resp: 20 20  Temp: 99.3 F (37.4 C) 98.3 F (36.8 C)  SpO2: 95% 94%    Intake/Output Summary (Last 24 hours) at 01/21/2019 0718 Last data filed at 01/21/2019 0300  Gross per 24 hour  Intake 960 ml  Output 3401 ml  Net -2441 ml   Filed Weights   01/20/19 1158 01/21/19 0425  Weight: 77.1 kg 78.6 kg   Body mass index is 30.7 kg/m.   Physical Exam: GENERAL: Patient is alert  awake. Not in obvious distress.  Obese HENT: No scleral pallor or icterus. Pupils equally reactive to light. Oral mucosa is moist NECK: is supple, no palpable thyroid enlargement. CHEST:  Diminished breath sounds bilaterally.  No definite wheezes or crackles noted. CVS: S1 and S2 heard,   Irregularly irregular rhythm with systolic murmur, no pericardial rub. ABDOMEN: Soft, non-tender, bowel sounds are present. No palpable hepato-splenomegaly. EXTREMITIES: Right lower extremity pitting edema more than the left. CNS: Cranial nerves are intact. No focal motor or sensory deficits. SKIN: warm and dry without rashes.  Data Review: I have personally reviewed the following laboratory data and studies,  CBC: Recent Labs  Lab 01/20/19 0918  WBC 4.6  NEUTROABS 3.1  HGB 12.0  HCT 38.8  MCV 97.5  PLT 76*   Basic Metabolic Panel: Recent Labs  Lab 01/20/19 0918 01/21/19 0347  NA 145 144  K 3.8 3.5  CL 107 107  CO2 26 28  GLUCOSE 106* 95  BUN 30* 27*  CREATININE 2.13* 1.56*  CALCIUM 9.4 8.8*   Liver Function Tests: Recent Labs  Lab 01/20/19 0918  AST 25  ALT 15  ALKPHOS 41  BILITOT 0.7  PROT 5.9*  ALBUMIN 3.8   No results for input(s): LIPASE, AMYLASE in the last 168 hours. No results for input(s): AMMONIA in the last 168 hours. Cardiac Enzymes: No results for input(s): CKTOTAL, CKMB, CKMBINDEX, TROPONINI in the last 168 hours. BNP (last 3 results) Recent Labs    01/20/19 0941  BNP 852.0*    ProBNP (last 3 results) No results for input(s): PROBNP in the last 8760 hours.  CBG: No results for input(s): GLUCAP in the last 168 hours. Recent Results (from the past 240 hour(s))  SARS Coronavirus 2 (CEPHEID - Performed in [place] hospital lab), Hosp Order     Status: None   Collection Time: 01/20/19 12:30 PM   Specimen: Nasopharyngeal Swab  Result Value Ref Range Status   SARS Coronavirus 2 NEGATIVE NEGATIVE Final    Comment: (NOTE) If result is  NEGATIVE SARS-CoV-2 target nucleic acids are NOT DETECTED. The SARS-CoV-2 RNA is generally detectable in upper and lower  respiratory specimens during the acute phase of infection. The lowest  concentration of SARS-CoV-2 viral copies this assay can detect is 250  copies / mL. A negative result does not preclude SARS-CoV-2 infection  and should not be used as the sole basis for treatment or other  patient management decisions.  A negative result may occur with  improper specimen collection / handling, submission of specimen other  than nasopharyngeal swab, presence of viral mutation(s) within the  areas targeted by this assay, and inadequate number of viral copies  (<250 copies / mL). A negative result must be combined with clinical  observations, patient history, and epidemiological information. If result is POSITIVE SARS-CoV-2 target nucleic acids are DETECTED. The SARS-CoV-2 RNA is generally detectable in upper and lower  respiratory specimens dur ing the acute phase of infection.  Positive  results are indicative of active infection with SARS-CoV-2.  Clinical  correlation with patient history and other diagnostic information is  necessary to determine patient infection status.  Positive results do  not rule out bacterial infection  or co-infection with other viruses. If result is PRESUMPTIVE POSTIVE SARS-CoV-2 nucleic acids MAY BE PRESENT.   A presumptive positive result was obtained on the submitted specimen  and confirmed on repeat testing.  While 2019 novel coronavirus  (SARS-CoV-2) nucleic acids may be present in the submitted sample  additional confirmatory testing may be necessary for epidemiological  and / or clinical management purposes  to differentiate between  SARS-CoV-2 and other Sarbecovirus currently known to infect humans.  If clinically indicated additional testing with an alternate test  methodology [phone]) is advised. The SARS-CoV-2 RNA is generally  detectable  in upper and lower respiratory sp ecimens during the acute  phase of infection. The expected result is Negative. Fact Sheet for Patients:  StrictlyIdeas.no Fact Sheet for Healthcare Providers: BankingDealers.co.za This test is not yet approved or cleared by the [place] FDA and has been authorized for detection and/or diagnosis of SARS-CoV-2 by FDA under an Emergency Use Authorization (EUA).  This EUA will remain in effect (meaning this test can be used) for the duration of the COVID-19 declaration under Section 564(b)(1) of the Act, 21 U.S.C. section 360bbb-3(b)(1), unless the authorization is terminated or revoked sooner. Performed at [facility], [place] [address] [place], [place] [address]      Studies: Dg Chest 2 View  Result Date: 01/20/2019 CLINICAL DATA:  Hip pain. No reported chest symptoms. EXAM: CHEST - 2 VIEW COMPARISON:  06/01/2018. FINDINGS: Progressive enlargement of the cardiac silhouette. Mild increase in prominence of the pulmonary vasculature and interstitial markings. Interval minimal left pleural thickening or fluid. The inferior posterior costophrenic angles are not included on the lateral view. There is minimal patchy opacity at the posterior lung bases on that view. Thoracic spine degenerative changes. Diffuse osteopenia. Superior migration of both humeral heads, compatible with chronic rotator cuff tears. IMPRESSION: 1. Progressive cardiomegaly and minimal changes of congestive heart failure. 2. Minimal patchy atelectasis or pneumonia at the posterior lung bases. Electronically Signed   By: Megan Revering M.D.   On: 01/20/2019 10:45   Dg Hip Unilat W Or Wo Pelvis 2-3 Views Left  Result Date: 01/20/2019 CLINICAL DATA:  Lateral left hip pain. No known injury. EXAM: DG HIP (WITH OR WITHOUT PELVIS) 2-3V LEFT COMPARISON:  Right hip dated 05/25/2018. FINDINGS: Stable left hip total prosthesis, diffuse osteopenia and  lower thoracic spine degenerative changes. No fracture or dislocation seen and no evidence of prosthetic loosening. Left acetabula protrusio is unchanged. Unremarkable right hip. Atheromatous arterial calcifications. IMPRESSION: 1. No acute abnormality. 2. Hardware intact. Electronically Signed   By: Megan Revering M.D.   On: 01/20/2019 10:42   Vas [place] Lower Extremity Venous (dvt) (mc And Wl 7a-7p)  Result Date: 01/20/2019  Lower Venous Study Indications: Edema.  Comparison Study: 09/17/15 negative Performing Technologist: June Leap RDMS, RVT  Examination Guidelines: A complete evaluation includes B-mode imaging, spectral Doppler, color Doppler, and power Doppler as needed of all accessible portions of each vessel. Bilateral testing is considered an integral part of a complete examination. Limited examinations for reoccurring indications may be performed as noted.  +---------+---------------+---------+-----------+----------+-------+  RIGHT     Compressibility Phasicity Spontaneity Properties Summary  +---------+---------------+---------+-----------+----------+-------+  CFV       Full            Yes       Yes                             +---------+---------------+---------+-----------+----------+-------+  SFJ       Full                                                      +---------+---------------+---------+-----------+----------+-------+  FV Prox   Full                                                      +---------+---------------+---------+-----------+----------+-------+  FV Mid    Full                                                      +---------+---------------+---------+-----------+----------+-------+  FV Distal Full                                                      +---------+---------------+---------+-----------+----------+-------+  PFV       Full                                                      +---------+---------------+---------+-----------+----------+-------+  POP       Full            Yes        Yes                             +---------+---------------+---------+-----------+----------+-------+  PTV       Full                                                      +---------+---------------+---------+-----------+----------+-------+  PERO      Full                                                      +---------+---------------+---------+-----------+----------+-------+   +---------+---------------+---------+-----------+----------+-------+  LEFT      Compressibility Phasicity Spontaneity Properties Summary  +---------+---------------+---------+-----------+----------+-------+  CFV       Full            Yes       Yes                             +---------+---------------+---------+-----------+----------+-------+  SFJ       Full                                                      +---------+---------------+---------+-----------+----------+-------+  FV Prox   Full                                                      +---------+---------------+---------+-----------+----------+-------+  FV Mid    Full                                                      +---------+---------------+---------+-----------+----------+-------+  FV Distal Full                                                      +---------+---------------+---------+-----------+----------+-------+  PFV       Full                                                      +---------+---------------+---------+-----------+----------+-------+  POP       Full            Yes       Yes                             +---------+---------------+---------+-----------+----------+-------+  PTV       Full                                                      +---------+---------------+---------+-----------+----------+-------+  PERO      Full                                                      +---------+---------------+---------+-----------+----------+-------+     Summary: Right: There is no evidence of deep vein thrombosis in the lower extremity. No cystic structure found in  the popliteal fossa. Left: There is no evidence of deep vein thrombosis in the lower extremity. No cystic structure found in the popliteal fossa.  *See table(s) above for measurements and observations. Electronically signed by Monica Martinez MD on 01/20/2019 at 1:37:00 PM.    Final     Scheduled Meds:  aspirin EC  81 mg Oral Daily   atorvastatin  20 mg Oral q1800   furosemide  40 mg Intravenous BID   isosorbide mononitrate  15 mg Oral Daily   metoprolol tartrate  37.5 mg Oral BID   mometasone-formoterol  2 puff Inhalation BID   multivitamin with minerals  1 tablet Oral Daily   QUEtiapine  25 mg Oral QHS   sodium chloride flush  3 mL Intravenous Q12H   tamsulosin  0.4 mg Oral Daily   Warfarin - Pharmacist Dosing Inpatient   Does not apply q1800  Continuous Infusions:  sodium chloride Stopped (01/20/19 1703)     Flora Lipps, MD  [facility] 01/21/2019

## 2019-01-21 NOTE — Progress Notes (Signed)
ANTICOAGULATION CONSULT NOTE - Consult  Pharmacy Consult for warfarin Indication: atrial fibrillation and hx VTE  No Known Allergies  Patient Measurements: Height: 5\' 3"  (160 cm) Weight: 173 lb 4.8 oz (78.6 kg) IBW/kg (Calculated) : 52.4  Vital Signs: Temp: 98.9 F (37.2 C) (07/18 0850) Temp Source: Oral (07/18 0850) BP: 93/58 (07/18 0850) Pulse Rate: 77 (07/18 0850)  Labs: Recent Labs    01/20/19 0918 01/20/19 0934 01/20/19 1537 01/20/19 1711 01/21/19 0347  HGB 12.0  --   --   --   --   HCT 38.8  --   --   --   --   PLT 76*  --   --   --   --   LABPROT  --  36.4*  --   --  38.2*  INR  --  3.7*  --   --  4.0*  CREATININE 2.13*  --   --   --  1.56*  TROPONINIHS  --   --  6 7  --     Estimated Creatinine Clearance: 23.8 mL/min (A) (by C-G formula based on SCr of 1.56 mg/dL (H)).   Medical History: Past Medical History:  Diagnosis Date  . Acute encephalopathy   . Acute renal failure superimposed on stage 3 chronic kidney disease ([place])   . Anemia   . Atrial fibrillation ([place])   . Colon cancer ([place]) 12/11  . Colon cancer ([place]) 2011  . Diarrhea   . DVT (deep venous thrombosis) ([place]) 11/06  . Elevated brain natriuretic peptide (BNP) level   . Frequent headaches   . Heart murmur   . Hyperkalemia   . Hypertension   . Pulmonary embolism ([place]) 11/06  . Seizures ([place])   . Stroke ([place]) 11/30/2014  . Urine retention 01/2019   Assessment: 65 yof presented to the ED with concerns for a recurrent UTI. She is on chronic warfarin for history of afib and VTE. INR is supratherapeutic today at 4. No bleeding noted. Hgb is WNL and platelets are low but this appears to be patients baseline.   Goal of Therapy:  INR 2-3 Monitor platelets by anticoagulation protocol: Yes   Plan:  No warfarin tonight Daily INR  Lorel Monaco, PharmD PGY1 Ambulatory Care Resident [facility] # [phone]

## 2019-01-21 NOTE — Progress Notes (Signed)
Updated daughter about patient

## 2019-01-21 NOTE — Progress Notes (Signed)
Updated patients daughter on plan of care. Patients daughter is worried about her oxygen levels dropping during activity. Says she gets very SHOB while walking.

## 2019-01-21 NOTE — Progress Notes (Signed)
  Echocardiogram 2D Echocardiogram has been performed.  [name] 01/21/2019, 11:41 AM

## 2019-01-22 DIAGNOSIS — I4891 Unspecified atrial fibrillation: Secondary | ICD-10-CM

## 2019-01-22 LAB — BASIC METABOLIC PANEL
Anion gap: 10 (ref 5–15)
BUN: 24 mg/dL — ABNORMAL HIGH (ref 8–23)
CO2: 27 mmol/L (ref 22–32)
Calcium: 8.5 mg/dL — ABNORMAL LOW (ref 8.9–10.3)
Chloride: 105 mmol/L (ref 98–111)
Creatinine, Ser: 1.41 mg/dL — ABNORMAL HIGH (ref 0.44–1.00)
GFR calc Af Amer: 38 mL/min — ABNORMAL LOW (ref >60)
GFR calc non Af Amer: 33 mL/min — ABNORMAL LOW (ref >60)
Glucose, Bld: 92 mg/dL (ref 70–99)
Potassium: 3.2 mmol/L — ABNORMAL LOW (ref 3.5–5.1)
Sodium: 142 mmol/L (ref 135–145)

## 2019-01-22 LAB — PROTIME-INR
INR: 2.7 — ABNORMAL HIGH (ref 0.8–1.2)
Prothrombin Time: 28.1 seconds — ABNORMAL HIGH (ref 11.4–15.2)

## 2019-01-22 LAB — CBC
HCT: 36.4 % (ref 36.0–46.0)
Hemoglobin: 11.8 g/dL — ABNORMAL LOW (ref 12.0–15.0)
MCH: 30.2 pg (ref 26.0–34.0)
MCHC: 32.4 g/dL (ref 30.0–36.0)
MCV: 93.1 fL (ref 80.0–100.0)
Platelets: 75 10*3/uL — ABNORMAL LOW (ref 150–400)
RBC: 3.91 MIL/uL (ref 3.87–5.11)
RDW: 14.9 % (ref 11.5–15.5)
WBC: 4.6 10*3/uL (ref 4.0–10.5)
nRBC: 0 % (ref 0.0–0.2)

## 2019-01-22 LAB — MAGNESIUM: Magnesium: 2.1 mg/dL (ref 1.7–2.4)

## 2019-01-22 LAB — PHOSPHORUS: Phosphorus: 3.4 mg/dL (ref 2.5–4.6)

## 2019-01-22 MED ORDER — WARFARIN SODIUM 2.5 MG PO TABS
2.5000 mg | ORAL_TABLET | Freq: Once | ORAL | Status: AC
  Administered 2019-01-22: 2.5 mg via ORAL
  Filled 2019-01-22: qty 1

## 2019-01-22 MED ORDER — POTASSIUM CHLORIDE CRYS ER 20 MEQ PO TBCR
40.0000 meq | EXTENDED_RELEASE_TABLET | Freq: Two times a day (BID) | ORAL | Status: DC
  Administered 2019-01-22 – 2019-01-24 (×6): 40 meq via ORAL
  Filled 2019-01-22 (×6): qty 2

## 2019-01-22 NOTE — Progress Notes (Addendum)
[facility] for Warfarin Indication: atrial fibrillation  No Known Allergies  Patient Measurements: Height: 5\' 3"  (160 cm) Weight: 167 lb 1.6 oz (75.8 kg) IBW/kg (Calculated) : 52.4   Vital Signs: Temp: 98.5 F (36.9 C) (07/19 0359) Temp Source: Oral (07/19 0359) BP: 113/49 (07/19 0839) Pulse Rate: 78 (07/19 0839)  Labs: Recent Labs    01/20/19 0918 01/20/19 0934 01/20/19 1537 01/20/19 1711 01/21/19 0347 01/22/19 0405  HGB 12.0  --   --   --   --  11.8*  HCT 38.8  --   --   --   --  36.4  PLT 76*  --   --   --   --  75*  LABPROT  --  36.4*  --   --  38.2* 28.1*  INR  --  3.7*  --   --  4.0* 2.7*  CREATININE 2.13*  --   --   --  1.56* 1.41*  TROPONINIHS  --   --  6 7  --   --     Estimated Creatinine Clearance: 25.9 mL/min (A) (by C-G formula based on SCr of 1.41 mg/dL (H)).   Medical History: Past Medical History:  Diagnosis Date  . Acute encephalopathy   . Acute renal failure superimposed on stage 3 chronic kidney disease ([place])   . Anemia   . Atrial fibrillation ([place])   . Colon cancer ([place]) 12/11  . Colon cancer ([place]) 2011  . Diarrhea   . DVT (deep venous thrombosis) ([place]) 11/06  . Elevated brain natriuretic peptide (BNP) level   . Frequent headaches   . Heart murmur   . Hyperkalemia   . Hypertension   . Pulmonary embolism ([place]) 11/06  . Seizures ([place])   . Stroke ([place]) 11/30/2014  . Urine retention 01/2019    Medications:  Scheduled:  . aspirin EC  81 mg Oral Daily  . atorvastatin  20 mg Oral q1800  . furosemide  40 mg Intravenous BID  . isosorbide mononitrate  15 mg Oral Daily  . metoprolol tartrate  37.5 mg Oral BID  . mometasone-formoterol  2 puff Inhalation BID  . multivitamin with minerals  1 tablet Oral Daily  . potassium chloride  40 mEq Oral BID WC  . QUEtiapine  25 mg Oral QHS  . sodium chloride flush  3 mL Intravenous Q12H  . tamsulosin  0.4 mg Oral Daily  . warfarin  2.5 mg Oral ONCE-1800  .  Warfarin - Pharmacist Dosing Inpatient   Does not apply q1800    Assessment: 85 y.o.female presenting with worsening shortness of breath with decreased urinary output. Past medical history for CKD stage III, diastolic CHF and A. fib, Patient on warfarin PTA for DVTs and atrial fibrillation (last dose 7/16). INR is therapeutic today at 2.7. No bleeding noted. Hgb is WNL and platelets are low but this appears to be patients baseline.   Goal of Therapy:  INR 2-3 Monitor platelets by anticoagulation protocol: Yes   Plan:  Warfarin 2.5 mg x1 Daily INR  Lorel Monaco, PharmD PGY1 Ambulatory Care Resident [facility] # [phone]  I discussed / reviewed the pharmacy note by Dr. Sudie Bailey and I agree with the resident's findings and plans as documented.  Thank you Anette Guarneri, PharmD

## 2019-01-22 NOTE — Progress Notes (Signed)
PROGRESS NOTE  [name] UKG:[id number] DOB: 1927-06-18 DOA: 01/20/2019 PCP: Colon Branch, MD   LOS: 1 day   Brief narrative: [name]  is a 85 y.o. female, with past medical history significant for chronic kidney disease stage III, diastolic congestive heart failure and chronic A. fib, presenting with worsening shortness of breath with decreased urinary output.  Patient denies any chest pains, fever, chills.  Daughter reports worsening altered mental status and fluid retention with increase in her lower extremity edema.  Patient is on warfarin for DVTs and atrial fibrillation.  Daughter reported that she is having difficulty taking care of her mom. Denies being exposed to any patient with COVID, fever chills or respiratory failure.  Assessment/Plan:  Active Problems:   Congestive heart failure (CHF) (HCC)  Acute diastolic congestive heart failure , elevated BNP with no acute EKG changes. Last echocardiogram on 11/2017 showing ejection fraction 65%.  Continue to telemetry monitor.  Patient continues to feel better with IV Lasix.Marland Kitchen    BNP elevated at 852.  Will closely monitor renal function on diuretics.  Creatinine continues to improve with diuresis.  COVID 19 test was negative.  Chest x-ray reviewed by me shows evidence of pulmonary vascular congestion.  Continue CHF protocol with strict intake and output charting, daily weights.  Patient is 4156 ml negative balance since admission.  Left hip pain.  Get PT OT evaluation.  X-ray of the hip without any acute findings.  Intact hardware.  History of pulmonary embolism. Continue with warfarin.   Pharmacy to dose Coumadin.  Chronic atrial fibrillation. Continue beta-blocker and Coumadin.  Currently rate controlled.  INR of 2.7 today.  Acute kidney injury on chronic kidney disease stage III.  Improving renal function likely cardiorenal syndrome.  Continue to monitor renal function on diuretics.  Mild hypokalemia today.  Will replace.  Check  BMP in a.m.  Deconditioning, debility.  Family has indicated that they were unable to take care of her at home.  We will get physical therapy occupational therapy evaluation to assess for rehabilitation.  Social workers have been consulted.   thrombocytopenia.  Will closely monitor.  Monitor for bleeding.  Dyslipidemia. Continue with Lipitor  History of thrombotic CVA/transient cerebral ischemia. Continue with aspirin and Lipitor  History of urinary retention. Flomax/Foley for accurate intake and output charting.   VTE Prophylaxis: Coumadin  Code Status: Full code  Family Communication: None today.  Disposition Plan: PT, OT evaluation, likely to skilled nursing facility versus Home PT. likely in 1 to 3 days.  Consultants:   None  Procedures:  None  Antibiotics: Anti-infectives (From admission, onward)   None     Subjective: Today, patient continues to feel better with breathing.  Denies any chest pain, fever chills or rigor.  Swelling has improved.  Complains of left hip pain.  Objective: Vitals:   01/22/19 0758 01/22/19 0839  BP:  (!) 113/49  Pulse:  78  Resp:  18  Temp:    SpO2: 95% 97%    Intake/Output Summary (Last 24 hours) at 01/22/2019 1221 Last data filed at 01/22/2019 0838 Gross per 24 hour  Intake 720 ml  Output 1875 ml  Net -1155 ml   Filed Weights   01/20/19 1158 01/21/19 0425 01/22/19 0359  Weight: 77.1 kg 78.6 kg 75.8 kg   Body mass index is 29.6 kg/m.   Physical Exam: General: Obese t, not in obvious distress HENT: Normocephalic, pupils equally reacting to light and accommodation.  No scleral pallor or  icterus noted. Oral mucosa is moist.  Chest:  Clear breath sounds.  Diminished breath sounds bilaterally. No crackles or wheezes.  CVS: S1 &S2 heard.  Irregularly irregular rhythm.  Systolic murmur noted. Abdomen: Soft, nontender, nondistended.  Bowel sounds are heard.  Liver is not palpable, no abdominal mass palpated Extremities:  No cyanosis, clubbing bilateral trace pedal edema more on the right, peripheral pulses are palpable. Psych: Alert, awake and oriented, normal mood CNS:  No cranial nerve deficits.  Power equal in all extremities.  No sensory deficits noted.  No cerebellar signs.   Skin: Warm and dry.  No rashes noted.   Data Review: I have personally reviewed the following laboratory data and studies,  CBC: Recent Labs  Lab 01/20/19 0918 01/22/19 0405  WBC 4.6 4.6  NEUTROABS 3.1  --   HGB 12.0 11.8*  HCT 38.8 36.4  MCV 97.5 93.1  PLT 76* 75*   Basic Metabolic Panel: Recent Labs  Lab 01/20/19 0918 01/21/19 0347 01/22/19 0405  NA 145 144 142  K 3.8 3.5 3.2*  CL 107 107 105  CO2 26 28 27   GLUCOSE 106* 95 92  BUN 30* 27* 24*  CREATININE 2.13* 1.56* 1.41*  CALCIUM 9.4 8.8* 8.5*  MG  --   --  2.1  PHOS  --   --  3.4   Liver Function Tests: Recent Labs  Lab 01/20/19 0918  AST 25  ALT 15  ALKPHOS 41  BILITOT 0.7  PROT 5.9*  ALBUMIN 3.8   No results for input(s): LIPASE, AMYLASE in the last 168 hours. No results for input(s): AMMONIA in the last 168 hours. Cardiac Enzymes: No results for input(s): CKTOTAL, CKMB, CKMBINDEX, TROPONINI in the last 168 hours. BNP (last 3 results) Recent Labs    01/20/19 0941  BNP 852.0*    ProBNP (last 3 results) No results for input(s): PROBNP in the last 8760 hours.  CBG: No results for input(s): GLUCAP in the last 168 hours. Recent Results (from the past 240 hour(s))  Urine culture     Status: None   Collection Time: 01/20/19 10:43 AM   Specimen: Urine, Clean Catch  Result Value Ref Range Status   Specimen Description URINE, CLEAN CATCH  Final   Special Requests   Final    NONE Performed at [facility], [address] [place], [place] [address]    Culture   Final    Multiple bacterial morphotypes present, none predominant. Suggest appropriate recollection if clinically indicated.   Report Status 01/21/2019 FINAL  Final  SARS  Coronavirus 2 (CEPHEID - Performed in [place] hospital lab), Hosp Order     Status: None   Collection Time: 01/20/19 12:30 PM   Specimen: Nasopharyngeal Swab  Result Value Ref Range Status   SARS Coronavirus 2 NEGATIVE NEGATIVE Final    Comment: (NOTE) If result is NEGATIVE SARS-CoV-2 target nucleic acids are NOT DETECTED. The SARS-CoV-2 RNA is generally detectable in upper and lower  respiratory specimens during the acute phase of infection. The lowest  concentration of SARS-CoV-2 viral copies this assay can detect is 250  copies / mL. A negative result does not preclude SARS-CoV-2 infection  and should not be used as the sole basis for treatment or other  patient management decisions.  A negative result may occur with  improper specimen collection / handling, submission of specimen other  than nasopharyngeal swab, presence of viral mutation(s) within the  areas targeted by this assay, and inadequate number  of viral copies  (<250 copies / mL). A negative result must be combined with clinical  observations, patient history, and epidemiological information. If result is POSITIVE SARS-CoV-2 target nucleic acids are DETECTED. The SARS-CoV-2 RNA is generally detectable in upper and lower  respiratory specimens dur ing the acute phase of infection.  Positive  results are indicative of active infection with SARS-CoV-2.  Clinical  correlation with patient history and other diagnostic information is  necessary to determine patient infection status.  Positive results do  not rule out bacterial infection or co-infection with other viruses. If result is PRESUMPTIVE POSTIVE SARS-CoV-2 nucleic acids MAY BE PRESENT.   A presumptive positive result was obtained on the submitted specimen  and confirmed on repeat testing.  While 2019 novel coronavirus  (SARS-CoV-2) nucleic acids may be present in the submitted sample  additional confirmatory testing may be necessary for epidemiological  and / or  clinical management purposes  to differentiate between  SARS-CoV-2 and other Sarbecovirus currently known to infect humans.  If clinically indicated additional testing with an alternate test  methodology [phone]) is advised. The SARS-CoV-2 RNA is generally  detectable in upper and lower respiratory sp ecimens during the acute  phase of infection. The expected result is Negative. Fact Sheet for Patients:  StrictlyIdeas.no Fact Sheet for Healthcare Providers: BankingDealers.co.za This test is not yet approved or cleared by the [place] FDA and has been authorized for detection and/or diagnosis of SARS-CoV-2 by FDA under an Emergency Use Authorization (EUA).  This EUA will remain in effect (meaning this test can be used) for the duration of the COVID-19 declaration under Section 564(b)(1) of the Act, 21 U.S.C. section 360bbb-3(b)(1), unless the authorization is terminated or revoked sooner. Performed at [facility], [place] [address] [place], [place] [address]      Studies: No results found.  Scheduled Meds:  aspirin EC  81 mg Oral Daily   atorvastatin  20 mg Oral q1800   furosemide  40 mg Intravenous BID   isosorbide mononitrate  15 mg Oral Daily   metoprolol tartrate  37.5 mg Oral BID   mometasone-formoterol  2 puff Inhalation BID   multivitamin with minerals  1 tablet Oral Daily   potassium chloride  40 mEq Oral BID WC   QUEtiapine  25 mg Oral QHS   sodium chloride flush  3 mL Intravenous Q12H   tamsulosin  0.4 mg Oral Daily   warfarin  2.5 mg Oral ONCE-1800   Warfarin - Pharmacist Dosing Inpatient   Does not apply q1800    Continuous Infusions:  sodium chloride Stopped (01/20/19 1703)     Flora Lipps, MD  [facility] 01/22/2019

## 2019-01-22 NOTE — Plan of Care (Signed)
  Problem: Skin Integrity: Goal: Risk for impaired skin integrity will decrease Outcome: Progressing   

## 2019-01-23 LAB — PROTIME-INR
INR: 2.3 — ABNORMAL HIGH (ref 0.8–1.2)
Prothrombin Time: 24.9 seconds — ABNORMAL HIGH (ref 11.4–15.2)

## 2019-01-23 MED ORDER — LIDOCAINE 5 % EX PTCH
1.0000 | MEDICATED_PATCH | CUTANEOUS | Status: DC
  Administered 2019-01-24: 1 via TRANSDERMAL
  Filled 2019-01-23: qty 1

## 2019-01-23 MED ORDER — FUROSEMIDE 40 MG PO TABS
40.0000 mg | ORAL_TABLET | Freq: Every day | ORAL | Status: DC
  Administered 2019-01-24: 40 mg via ORAL
  Filled 2019-01-23: qty 1

## 2019-01-23 MED ORDER — WARFARIN SODIUM 5 MG PO TABS
5.0000 mg | ORAL_TABLET | Freq: Once | ORAL | Status: AC
  Administered 2019-01-23: 5 mg via ORAL
  Filled 2019-01-23: qty 1

## 2019-01-23 NOTE — Consult Note (Signed)
   [facility] CM Inpatient Consult   01/23/2019  [name] 1926-10-20 [id number]  Patient screened for extreme high risk score [32%] for unplanned readmission score .  for hospitalizations. Patient had previously been outreached by [facility] Management Coordinator for post facility follow up however, patient daughter felt that there were no needs according to notes. Patient is eligible for [facility] Care management in the Medicare ACO.   Review of patient's medical record reveals patient is from MD progress notes as follows:  [name] a89 y.o.female,with past medical history significant for chronic kidney disease stage III, diastolic congestive heart failure and chronic A. fib, presenting with worsening shortness of breath with decreased urinary output. Patient denies any chest pains, fever, chills. Daughter reports worsening altered mental status and fluid retention with increase in her lower extremity edema. Patient is on warfarin for DVTs and atrial fibrillation. Daughter reported that she is having difficulty taking care of her mom.  Primary Care Provider is  Colon Branch, MD, this practice is listed to provide the transition of care follow up post hospital.   Plan:  Follow for progress and disposition.   Please place a [facility] Care Management consult as appropriate and for questions contact:   Natividad Brood, RN BSN [facility] Liaison  [phone] business mobile phone Toll free office [phone]  Fax number: [phone] [email or web address]

## 2019-01-23 NOTE — Discharge Instructions (Signed)

## 2019-01-23 NOTE — Evaluation (Signed)
Occupational Therapy Evaluation Patient Details Name: PERCY COMP MRN: [id number] DOB: [birth year] Today's Date: 01/23/2019    History of Present Illness 85 yo admitted with SOB, AMS, fluid retention and left hip pain with CHF exacerbation. PMhx: CHF, Afib, CKD, HTN, CVA, suizures, pulmonary HTN, CAD   Clinical Impression   PTA patient requires intermittent assistance with ADLs as needed (reports increased in the last week, but normally bathes and dresses herself), using RW for mobility.  Admitted for above and limited by problem list below, including pain in L hip, impaired balance, decreased activity tolerance and generalized weakness.  Patient currently requires mod assist for LB ADLs, min guard for transfers and min guard for grooming standing at sink. VSS. She will benefit from continued OT services while admitted and after dc at [facility] level in order to address listed deficits and progress towards PLOF.      Follow Up Recommendations  Home health OT;Supervision/Assistance - 24 hour    Equipment Recommendations  None recommended by OT    Recommendations for Other Services       Precautions / Restrictions Precautions Precautions: Fall Restrictions Weight Bearing Restrictions: No      Mobility Bed Mobility Overal bed mobility: Needs Assistance Bed Mobility: Supine to Sit     Supine to sit: Min guard;HOB elevated     General bed mobility comments: OOB in recliner upon entry  Transfers Overall transfer level: Needs assistance Equipment used: Rolling walker (2 wheeled) Transfers: Sit to/from Stand Sit to Stand: Min guard         General transfer comment: min guard for safety and balance, increased time and effort required    Balance Overall balance assessment: Needs assistance Sitting-balance support: No upper extremity supported;Feet supported Sitting balance-Leahy Scale: Good Sitting balance - Comments: able to sit EOB without assist   Standing balance  support: Bilateral upper extremity supported;Single extremity supported;During functional activity Standing balance-Leahy Scale: Poor Standing balance comment: relaint on UE support in standing, leaning foward to sink with grooming tasks                            ADL either performed or assessed with clinical judgement   ADL Overall ADL's : Needs assistance/impaired     Grooming: Min guard;Standing   Upper Body Bathing: Set up;Sitting   Lower Body Bathing: Moderate assistance;Sit to/from stand Lower Body Bathing Details (indicate cue type and reason): decreased reach to B feet due to L hip pain, min guard sit<>stand  Upper Body Dressing : Minimal assistance;Sitting   Lower Body Dressing: Moderate assistance;Sit to/from stand Lower Body Dressing Details (indicate cue type and reason): decreased reach to B feet due to L hip pain, min guard sit<>stand  Toilet Transfer: Min guard;Ambulation;RW Toilet Transfer Details (indicate cue type and reason): simulated to/from recliner  Toileting- Clothing Manipulation and Hygiene: Sit to/from stand;Minimal assistance       Functional mobility during ADLs: Min guard;Rolling walker General ADL Comments: pt limited by decreased activity tolerance, L hip pain     Vision         Perception     Praxis      Pertinent Vitals/Pain Pain Assessment: Faces Pain Score: 4  Faces Pain Scale: Hurts even more Pain Location: L hip Pain Descriptors / Indicators: Aching;Guarding;Discomfort Pain Intervention(s): Limited activity within patient's tolerance;Patient requesting pain meds-RN notified;Heat applied;Monitored during session;Repositioned     Hand Dominance Right   Extremity/Trunk Assessment Upper Extremity Assessment  Upper Extremity Assessment: Generalized weakness   Lower Extremity Assessment Lower Extremity Assessment: Defer to PT evaluation   Cervical / Trunk Assessment Cervical / Trunk Assessment: Kyphotic    Communication Communication Communication: HOH   Cognition Arousal/Alertness: Awake/alert Behavior During Therapy: WFL for tasks assessed/performed Overall Cognitive Status: Impaired/Different from baseline Area of Impairment: Memory                     Memory: Decreased short-term memory         General Comments: anticpate baseline    General Comments  HR 85-90    Exercises General Exercises - Lower Extremity Long Arc Quad: AROM;Left;Seated;10 reps Hip Flexion/Marching: AROM;Left;Seated;10 reps   Shoulder Instructions      Home Living Family/patient expects to be discharged to:: Private residence Living Arrangements: Children Available Help at Discharge: Family;Available 24 hours/day Type of Home: House Home Access: Stairs to enter [employer] of Steps: 17 Entrance Stairs-Rails: Right Home Layout: Able to live on main level with bedroom/bathroom;Two level     Bathroom Shower/Tub: Occupational psychologist: Handicapped height     Home Equipment: Clinical cytogeneticist - 2 wheels;Toilet riser          Prior Functioning/Environment Level of Independence: Needs assistance  Gait / Transfers Assistance Needed: use of RW ADL's / Homemaking Assistance Needed: daughter provides assist with cooking, cleaning, ADLs. pt reports she dresses and bathes herself            OT Problem List: Decreased strength;Decreased activity tolerance;Impaired balance (sitting and/or standing);Pain;Cardiopulmonary status limiting activity;Decreased knowledge of use of DME or AE      OT Treatment/Interventions: Self-care/ADL training;DME and/or AE instruction;Therapeutic activities;Patient/family education;Balance training    OT Goals(Current goals can be found in the care plan section) Acute Rehab OT Goals Patient Stated Goal: return home OT Goal Formulation: With patient Time For Goal Achievement: 02/06/19 Potential to Achieve Goals: Good  OT Frequency:  Min 2X/week   Barriers to D/C:            Co-evaluation              AM-PAC OT "6 Clicks" Daily Activity     Outcome Measure Help from another person eating meals?: A Little Help from another person taking care of personal grooming?: A Little Help from another person toileting, which includes using toliet, bedpan, or urinal?: A Little Help from another person bathing (including washing, rinsing, drying)?: A Lot Help from another person to put on and taking off regular upper body clothing?: A Little Help from another person to put on and taking off regular lower body clothing?: A Lot 6 Click Score: 16   End of Session Equipment Utilized During Treatment: Gait belt;Rolling walker Nurse Communication: Mobility status;Patient requests pain meds  Activity Tolerance: Patient limited by pain Patient left: in chair;with call bell/phone within reach;with chair alarm set  OT Visit Diagnosis: Other abnormalities of gait and mobility (R26.89);Muscle weakness (generalized) (M62.81);Pain Pain - Right/Left: Left Pain - part of body: Hip                Time: 0881-1031 OT Time Calculation (min): 20 min Charges:  OT General Charges $OT Visit: 1 Visit OT Evaluation $OT Eval Moderate Complexity: Anza, OT Acute Rehabilitation Services Pager [phone] Office [phone]   Delight Stare 01/23/2019, 9:20 AM

## 2019-01-23 NOTE — Evaluation (Signed)
Physical Therapy Evaluation Patient Details Name: [name] MRN: [id number] DOB: 07-25-1926 Today's Date: 01/23/2019   History of Present Illness  85 yo admitted with SOB, AMS, fluid retention and left hip pain with CHF exacerbation. PMhx: CHF, Afib, CKD, HTN, CVA, suizures, pulmonary HTN, CAD  Clinical Impression  PT very pleasant reporting she moves around on her own at home with RW, climbs flight of stairs to garage when going to doctors appointments and that family does all homemaking. Pt reports incontinence with use of adult briefs at home with foley currently. Pt oriented x 4 with slightly decreased memory and processing needing supervision/minguard for activity for safety. PT able to return home if family can provide this level of assist but noted mention of family seeking sNF. Pt with decreased strength, balance and function with generalized LLE pain x 1 week without specific contributing cause. Pt will benefit from acute therapy to maximize mobility, function and safety to decrease burden of care.  HR 96 SpO2 93% walking on RA     Follow Up Recommendations Home health PT;Supervision for mobility/OOB    Equipment Recommendations  None recommended by PT(stair lift)    Recommendations for Other Services       Precautions / Restrictions Precautions Precautions: Fall      Mobility  Bed Mobility Overal bed mobility: Needs Assistance Bed Mobility: Supine to Sit     Supine to sit: Min guard;HOB elevated     General bed mobility comments: with rail and HOb 30 degrees pt able to pivot to EOB with increased time  Transfers Overall transfer level: Needs assistance   Transfers: Sit to/from Stand Sit to Stand: Min guard         General transfer comment: guarding to stand from bed and to chair with cues for hand placement and safety  Ambulation/Gait Ambulation/Gait assistance: Min guard Gait Distance (Feet): 100 Feet Assistive device: Rolling walker (2  wheeled) Gait Pattern/deviations: Step-through pattern;Decreased stride length;Decreased stance time - left   Gait velocity interpretation: 1.31 - 2.62 ft/sec, indicative of limited community ambulator General Gait Details: pt initially using TDWB on LLE due to pain but able to place foot flat after a couple steps. Extreme kyphosis with reliance on RW and maintained partial hip and knee flexion bil with gait  Stairs            Wheelchair Mobility    Modified Rankin (Stroke Patients Only)       Balance Overall balance assessment: Needs assistance   Sitting balance-Leahy Scale: Good Sitting balance - Comments: able to sit EOB without assist   Standing balance support: Bilateral upper extremity supported Standing balance-Leahy Scale: Poor Standing balance comment: reliance on RW in standing                             Pertinent Vitals/Pain Pain Assessment: 0-10 Pain Score: 4  Pain Location: left thigh Pain Descriptors / Indicators: Aching;Guarding Pain Intervention(s): Limited activity within patient's tolerance;Repositioned;Monitored during session    Home Living Family/patient expects to be discharged to:: Private residence Living Arrangements: Children Available Help at Discharge: Family;Available 24 hours/day Type of Home: House Home Access: Stairs to enter Entrance Stairs-Rails: Right Entrance Stairs-Number of Steps: 17 Home Layout: Able to live on main level with bedroom/bathroom;Two level Home Equipment: Clinical cytogeneticist - 2 wheels      Prior Function Level of Independence: Needs assistance   Gait / Transfers Assistance Needed: use of RW  ADL's / Homemaking Assistance Needed: daughter provides assist with cooking, cleaning, ADLs. pt reports she dresses and bathes herself        Hand Dominance        Extremity/Trunk Assessment   Upper Extremity Assessment Upper Extremity Assessment: Generalized weakness    Lower Extremity  Assessment Lower Extremity Assessment: Generalized weakness    Cervical / Trunk Assessment Cervical / Trunk Assessment: Kyphotic  Communication   Communication: HOH  Cognition Arousal/Alertness: Awake/alert Behavior During Therapy: WFL for tasks assessed/performed Overall Cognitive Status: Impaired/Different from baseline Area of Impairment: Memory                     Memory: Decreased short-term memory                General Comments      Exercises General Exercises - Lower Extremity Long Arc Quad: AROM;Left;Seated;10 reps Hip Flexion/Marching: AROM;Left;Seated;10 reps   Assessment/Plan    PT Assessment Patient needs continued PT services  PT Problem List Decreased strength;Decreased mobility;Decreased activity tolerance;Decreased balance;Decreased knowledge of use of DME       PT Treatment Interventions Gait training;Therapeutic exercise;Patient/family education;Stair training;Functional mobility training;DME instruction;Therapeutic activities    PT Goals (Current goals can be found in the Care Plan section)  Acute Rehab PT Goals Patient Stated Goal: return home PT Goal Formulation: With patient Time For Goal Achievement: 02/06/19 Potential to Achieve Goals: Good    Frequency Min 3X/week   Barriers to discharge Inaccessible home environment      Co-evaluation               AM-PAC PT "6 Clicks" Mobility  Outcome Measure Help needed turning from your back to your side while in a flat bed without using bedrails?: A Little Help needed moving from lying on your back to sitting on the side of a flat bed without using bedrails?: A Little Help needed moving to and from a bed to a chair (including a wheelchair)?: A Little Help needed standing up from a chair using your arms (e.g., wheelchair or bedside chair)?: A Little Help needed to walk in hospital room?: A Little Help needed climbing 3-5 steps with a railing? : A Little 6 Click Score: 18     End of Session Equipment Utilized During Treatment: Gait belt Activity Tolerance: Patient tolerated treatment well Patient left: in chair;with chair alarm set;with call bell/phone within reach Nurse Communication: Mobility status PT Visit Diagnosis: Other abnormalities of gait and mobility (R26.89);Muscle weakness (generalized) (M62.81);Unsteadiness on feet (R26.81)    Time: 5284-1324 PT Time Calculation (min) (ACUTE ONLY): 20 min   Charges:   PT Evaluation $PT Eval Moderate Complexity: 1 Mod          [place], PT Acute Rehabilitation Services Pager: [phone] Office: [phone]   Syler Norcia B Morgen Linebaugh 01/23/2019, 8:30 AM

## 2019-01-23 NOTE — Progress Notes (Signed)
ANTICOAGULATION CONSULT NOTE - Follow Up Consult  Pharmacy Consult for Coumadin Indication: Afib/CVA and DVT/PE  No Known Allergies  Patient Measurements: Height: 5\' 3"  (160 cm) Weight: 164 lb 8 oz (74.6 kg) IBW/kg (Calculated) : 52.4   Vital Signs: Temp: 98.2 F (36.8 C) (07/20 0918) Temp Source: Oral (07/20 0918) BP: 134/70 (07/20 0918) Pulse Rate: 88 (07/20 0918)  Labs: Recent Labs    01/20/19 1537 01/20/19 1711 01/21/19 0347 01/22/19 0405 01/23/19 0453  HGB  --   --   --  11.8*  --   HCT  --   --   --  36.4  --   PLT  --   --   --  75*  --   LABPROT  --   --  38.2* 28.1* 24.9*  INR  --   --  4.0* 2.7* 2.3*  CREATININE  --   --  1.56* 1.41*  --   TROPONINIHS 6 7  --   --   --     Estimated Creatinine Clearance: 25.7 mL/min (A) (by C-G formula based on SCr of 1.41 mg/dL (H)).   Assessment: Anticoag: Warfarin PTA for hx afib/CVA and DVT/PEs (last dose 7/16). INR 2.3. Hgb 11.8 stable. Plts 75 chronic. No Coumadin 7/17 or 7/18. Dose PTA = 2.5 mg daily except 5 mg one day per week with admit INR 3.7  Goal of Therapy:  INR 2-3 Monitor platelets by anticoagulation protocol: Yes   Plan:  Warfarin 5 mg x1 Daily INR   Stesha Neyens S. Alford Highland, PharmD, BCPS Clinical Staff Pharmacist Eilene Ghazi Stillinger 01/23/2019,9:44 AM

## 2019-01-23 NOTE — Progress Notes (Signed)
PROGRESS NOTE  TACI STERLING SJG:[id number] DOB: 1927/03/27 DOA: 01/20/2019 PCP: Colon Branch, MD   LOS: 2 days   Brief narrative: [name]  is a 85 y.o. female, with past medical history significant for chronic kidney disease stage III, diastolic congestive heart failure and chronic A. fib, presenting with worsening shortness of breath with decreased urinary output.  Patient denies any chest pains, fever, chills.  Daughter reports worsening altered mental status and fluid retention with increase in her lower extremity edema.  Patient is on warfarin for DVTs and atrial fibrillation.  Daughter reported that she is having difficulty taking care of her mom. Denies being exposed to any patient with COVID, fever chills or respiratory failure.  Assessment/Plan:  Active Problems:   Congestive heart failure (CHF) (HCC)  Acute diastolic congestive heart failure , elevated BNP with no acute EKG changes. Last echocardiogram on 11/2017 showing ejection fraction 65%.   Patient continues to feel better with IV Lasix is improving..   Will closely monitor renal function on diuretics.  COVID 19 test was negative.  Chest x-ray reviewed by me shows evidence of pulmonary vascular congestion.  Continue CHF protocol with strict intake and output charting, daily weights.  Patient is 5409 ml negative balance since admission.  Change Lasix to oral.  Left hip pain.  PT OT evaluation done recommend home health PT OT on discharge.[place] of the hip without any acute findings.  Intact hardware.  Apply Lidoderm patch because she still complains of pain on that area.  Continue warm compression.  History of pulmonary embolism. Continue with warfarin.   Pharmacy to dose Coumadin.  No chest pain or acute issues.  Chronic atrial fibrillation. Continue beta-blocker and Coumadin.    INR of 2.7 today.  Acute kidney injury on chronic kidney disease stage III.  Improving renal function with diuresis likely cardiorenal syndrome.   Continue to monitor renal function on diuretics.  BMP in a.m.  Mild hypokalemia.  Replenished.  Check levels in a.m.  Deconditioning, debility.  Family has indicated that they were unable to take care of her at home.  Physical and occupational therapy recommend home health OT and PT on discharge.   thrombocytopenia.  Will closely monitor.  Monitor for bleeding.  Dyslipidemia. Continue with Lipitor  History of thrombotic CVA/transient cerebral ischemia. Continue with aspirin and Lipitor  History of urinary retention. Flomax/Foley for accurate intake and output charting.  Will consider discontinuation of Foley catheter.   VTE Prophylaxis: Coumadin  Code Status: Full code  Family Communication: None today.  Disposition Plan: Home with home health in a.m.  Discontinue Foley catheter for voiding trial.  Check electrolytes and BMP in a.m.  Consultants:   None   Procedures:  None  Antibiotics: Anti-infectives (From admission, onward)   None     Subjective: Today, patient still complains of left hip pain.  She feels better with breathing.  Denies any chest pain, fever chills or rigor.  Objective: Vitals:   01/23/19 0918 01/23/19 1143  BP: 134/70 124/66  Pulse: 88 (!) 121  Resp:  15  Temp: 98.2 F (36.8 C) 97.8 F (36.6 C)  SpO2: 97% 92%    Intake/Output Summary (Last 24 hours) at 01/23/2019 1416 Last data filed at 01/23/2019 0941 Gross per 24 hour  Intake 597 ml  Output 1850 ml  Net -1253 ml   Filed Weights   01/21/19 0425 01/22/19 0359 01/23/19 0547  Weight: 78.6 kg 75.8 kg 74.6 kg   Body  mass index is 29.14 kg/m.   Physical Exam: General: Obese t, not in obvious distress HENT: Normocephalic, pupils equally reacting to light and accommodation.  No scleral pallor or icterus noted. Oral mucosa is moist.  Chest: Diminished breath sounds bilaterally. CVS: S1 &S2 heard. No murmur.  Irregularly irregular rhythm, systolic murmur.   Abdomen: Soft, nontender,  nondistended.  Bowel sounds are heard.  Liver is not palpable, no abdominal mass palpated Extremities: No cyanosis, clubbing , lower extremity edema which has improved.  Peripheral pulses are palpable. Psych: Alert, awake and oriented, normal mood CNS:  No cranial nerve deficits.  Power equal in all extremities.  No sensory deficits noted.  No cerebellar signs.   Skin: Warm and dry.  No rashes noted.   Data Review: I have personally reviewed the following laboratory data and studies,  CBC: Recent Labs  Lab 01/20/19 0918 01/22/19 0405  WBC 4.6 4.6  NEUTROABS 3.1  --   HGB 12.0 11.8*  HCT 38.8 36.4  MCV 97.5 93.1  PLT 76* 75*   Basic Metabolic Panel: Recent Labs  Lab 01/20/19 0918 01/21/19 0347 01/22/19 0405  NA 145 144 142  K 3.8 3.5 3.2*  CL 107 107 105  CO2 26 28 27   GLUCOSE 106* 95 92  BUN 30* 27* 24*  CREATININE 2.13* 1.56* 1.41*  CALCIUM 9.4 8.8* 8.5*  MG  --   --  2.1  PHOS  --   --  3.4   Liver Function Tests: Recent Labs  Lab 01/20/19 0918  AST 25  ALT 15  ALKPHOS 41  BILITOT 0.7  PROT 5.9*  ALBUMIN 3.8   No results for input(s): LIPASE, AMYLASE in the last 168 hours. No results for input(s): AMMONIA in the last 168 hours. Cardiac Enzymes: No results for input(s): CKTOTAL, CKMB, CKMBINDEX, TROPONINI in the last 168 hours. BNP (last 3 results) Recent Labs    01/20/19 0941  BNP 852.0*    ProBNP (last 3 results) No results for input(s): PROBNP in the last 8760 hours.  CBG: No results for input(s): GLUCAP in the last 168 hours. Recent Results (from the past 240 hour(s))  Urine culture     Status: None   Collection Time: 01/20/19 10:43 AM   Specimen: Urine, Clean Catch  Result Value Ref Range Status   Specimen Description URINE, CLEAN CATCH  Final   Special Requests   Final    NONE Performed at [facility], [address] [place], [place] [address]    Culture   Final    Multiple bacterial morphotypes present, none predominant.  Suggest appropriate recollection if clinically indicated.   Report Status 01/21/2019 FINAL  Final  SARS Coronavirus 2 (CEPHEID - Performed in [place] hospital lab), Hosp Order     Status: None   Collection Time: 01/20/19 12:30 PM   Specimen: Nasopharyngeal Swab  Result Value Ref Range Status   SARS Coronavirus 2 NEGATIVE NEGATIVE Final    Comment: (NOTE) If result is NEGATIVE SARS-CoV-2 target nucleic acids are NOT DETECTED. The SARS-CoV-2 RNA is generally detectable in upper and lower  respiratory specimens during the acute phase of infection. The lowest  concentration of SARS-CoV-2 viral copies this assay can detect is 250  copies / mL. A negative result does not preclude SARS-CoV-2 infection  and should not be used as the sole basis for treatment or other  patient management decisions.  A negative result may occur with  improper specimen collection / handling, submission  of specimen other  than nasopharyngeal swab, presence of viral mutation(s) within the  areas targeted by this assay, and inadequate number of viral copies  (<250 copies / mL). A negative result must be combined with clinical  observations, patient history, and epidemiological information. If result is POSITIVE SARS-CoV-2 target nucleic acids are DETECTED. The SARS-CoV-2 RNA is generally detectable in upper and lower  respiratory specimens dur ing the acute phase of infection.  Positive  results are indicative of active infection with SARS-CoV-2.  Clinical  correlation with patient history and other diagnostic information is  necessary to determine patient infection status.  Positive results do  not rule out bacterial infection or co-infection with other viruses. If result is PRESUMPTIVE POSTIVE SARS-CoV-2 nucleic acids MAY BE PRESENT.   A presumptive positive result was obtained on the submitted specimen  and confirmed on repeat testing.  While 2019 novel coronavirus  (SARS-CoV-2) nucleic acids may be present  in the submitted sample  additional confirmatory testing may be necessary for epidemiological  and / or clinical management purposes  to differentiate between  SARS-CoV-2 and other Sarbecovirus currently known to infect humans.  If clinically indicated additional testing with an alternate test  methodology [phone]) is advised. The SARS-CoV-2 RNA is generally  detectable in upper and lower respiratory sp ecimens during the acute  phase of infection. The expected result is Negative. Fact Sheet for Patients:  StrictlyIdeas.no Fact Sheet for Healthcare Providers: BankingDealers.co.za This test is not yet approved or cleared by the [place] FDA and has been authorized for detection and/or diagnosis of SARS-CoV-2 by FDA under an Emergency Use Authorization (EUA).  This EUA will remain in effect (meaning this test can be used) for the duration of the COVID-19 declaration under Section 564(b)(1) of the Act, 21 U.S.C. section 360bbb-3(b)(1), unless the authorization is terminated or revoked sooner. Performed at [facility], [place] [address], [place], [place] [address]      Studies: No results found.  Scheduled Meds: . aspirin EC  81 mg Oral Daily  . atorvastatin  20 mg Oral q1800  . furosemide  40 mg Intravenous BID  . isosorbide mononitrate  15 mg Oral Daily  . lidocaine  1 patch Transdermal Q24H  . metoprolol tartrate  37.5 mg Oral BID  . mometasone-formoterol  2 puff Inhalation BID  . multivitamin with minerals  1 tablet Oral Daily  . potassium chloride  40 mEq Oral BID WC  . QUEtiapine  25 mg Oral QHS  . sodium chloride flush  3 mL Intravenous Q12H  . tamsulosin  0.4 mg Oral Daily  . warfarin  5 mg Oral ONCE-1800  . Warfarin - Pharmacist Dosing Inpatient   Does not apply q1800    Continuous Infusions: . sodium chloride Stopped (01/20/19 1703)     Flora Lipps, MD  [facility] 01/23/2019

## 2019-01-24 LAB — CBC
HCT: 43 % (ref 36.0–46.0)
Hemoglobin: 13.8 g/dL (ref 12.0–15.0)
MCH: 30.3 pg (ref 26.0–34.0)
MCHC: 32.1 g/dL (ref 30.0–36.0)
MCV: 94.5 fL (ref 80.0–100.0)
Platelets: 74 10*3/uL — ABNORMAL LOW (ref 150–400)
RBC: 4.55 MIL/uL (ref 3.87–5.11)
RDW: 14.9 % (ref 11.5–15.5)
WBC: 5.1 10*3/uL (ref 4.0–10.5)
nRBC: 0 % (ref 0.0–0.2)

## 2019-01-24 LAB — BASIC METABOLIC PANEL
Anion gap: 8 (ref 5–15)
BUN: 25 mg/dL — ABNORMAL HIGH (ref 8–23)
CO2: 24 mmol/L (ref 22–32)
Calcium: 9.4 mg/dL (ref 8.9–10.3)
Chloride: 112 mmol/L — ABNORMAL HIGH (ref 98–111)
Creatinine, Ser: 1.33 mg/dL — ABNORMAL HIGH (ref 0.44–1.00)
GFR calc Af Amer: 41 mL/min — ABNORMAL LOW (ref >60)
GFR calc non Af Amer: 35 mL/min — ABNORMAL LOW (ref >60)
Glucose, Bld: 107 mg/dL — ABNORMAL HIGH (ref 70–99)
Potassium: 4.7 mmol/L (ref 3.5–5.1)
Sodium: 144 mmol/L (ref 135–145)

## 2019-01-24 LAB — MAGNESIUM: Magnesium: 2 mg/dL (ref 1.7–2.4)

## 2019-01-24 LAB — PROTIME-INR
INR: 2.1 — ABNORMAL HIGH (ref 0.8–1.2)
Prothrombin Time: 23.3 seconds — ABNORMAL HIGH (ref 11.4–15.2)

## 2019-01-24 MED ORDER — TAMSULOSIN HCL 0.4 MG PO CAPS: 0.4000 mg | ORAL_CAPSULE | Freq: Every day | ORAL | 1 refills | Status: DC

## 2019-01-24 MED ORDER — LIDOCAINE 5 % EX PTCH: 1.0000 | MEDICATED_PATCH | CUTANEOUS | 0 refills | Status: AC

## 2019-01-24 MED ORDER — WARFARIN SODIUM 5 MG PO TABS
5.0000 mg | ORAL_TABLET | Freq: Once | ORAL | Status: AC
  Administered 2019-01-24: 5 mg via ORAL
  Filled 2019-01-24: qty 1

## 2019-01-24 NOTE — Discharge Summary (Signed)
Physician Discharge Summary  [name] NIO:[id number] DOB: 11-Mar-1927 DOA: 01/20/2019  PCP: Colon Branch, MD  Admit date: 01/20/2019 Discharge date: 01/24/2019  Admitted From: Home  Discharge disposition: Home with home health   Recommendations for Outpatient Follow-Up:   Follow up with your primary care provider in one week.    Discharge Diagnosis:   Active Problems:   Congestive heart failure (CHF) ([place])   Discharge Condition: Improved.  Diet recommendation: Low sodium, heart healthy.    Wound care: None.  Code status: Full.   History of Present Illness:   [name] a89 y.o.female,with past medical history significant for chronic kidney disease stage III, diastolic congestive heart failure and chronic A. fib, presenting with worsening shortness of breath with decreased urinary output. Patient denies any chest pains, fever, chills. Daughter reported worsening mental status and fluid retention with increase in her lower extremity edema. Patient is on warfarin for DVTs and atrial fibrillation. Daughter reported that she is having difficulty taking care of her mom.  Patient denied being exposed to any patient with COVID, fever chills or respiratory failure.   Hospital Course:  Following conditions were addressed during hospitalization,  Acute diastolic congestive heart failure,elevated BNP with no acute EKG changes. Last echocardiogram on 11/2017 showing ejection fraction 65%.   Patient continued to feel better with IV Lasix was transitioned to p.o. Lasix prior to discharge.Marland Kitchen  COVID 19 test was negative.    Initial chest x-ray reviewed showed evidence of pulmonary vascular congestion.    Patient was advised to fluid restriction, low-salt diet, daily weights on discharge.  Patient will have to follow-up with her primary care physician as outpatient in 1 to 2 weeks.  Left hip pain.  PT, OT evaluation done recommend home health PT OT on discharge.[name] without any acute findings.  Intact hardware.   Will prescribe Lidoderm patch on discharge.  Advised warm compression to the hip.  History of pulmonary embolism. Continue with warfarin.   Pharmacy to dose Coumadin.  No chest pain or acute issues.  Chronic atrial fibrillation. Continue beta-blocker and Coumadin.     Therapeutic INR.  Acute kidney injury on chronic kidney disease stage III.  Renal function improved with diuresis likely cardiorenal syndrome.    Monitor BMP as outpatient on the next visit.  Mild hypokalemia.    Resolved with replacement.  Deconditioning, debility.  Family has indicated that they were unable to take care of her at home.  Physical and occupational therapy recommend home health OT and PT on discharge.  Patient will be discharged with PT OT.   thrombocytopenia.  Will closely monitor.  Monitor for bleeding.  Dyslipidemia. Continue with Lipitor  History of thrombotic CVA/transient cerebral ischemia. Continue with aspirin and Lipitor.  No acute issues on this admission.  History of urinary retention.  Patient was started on Flomax and briefly required Foley catheter.  Foley catheter was discontinued and has passed voiding trial at this time.  We will continue Flomax on discharge.  Might need outpatient follow-up with primary care physician.  Disposition. At this time, patient is a stable for disposition home with home health PT OT.  I spoke with the patient's daughter on the phone and updated her about the clinical condition of the patient and the plan for disposition home.    Medical Consultants:    None   Subjective:   Today, patient feels okay.  Hip pain has improved.  Denies shortness of breath, chest  pain, palpitation, fever or chills.  Discharge Exam:   Vitals:   01/24/19 0421 01/24/19 0940  BP: (!) 141/93 (!) 142/67  Pulse: 75 95  Resp: 18   Temp: 98.2 F (36.8 C) 98.3 F (36.8 C)  SpO2: 93% 97%   Vitals:   01/23/19 2003  01/24/19 0058 01/24/19 0421 01/24/19 0940  BP: (!) 145/81  (!) 141/93 (!) 142/67  Pulse: 91  75 95  Resp: 18  18   Temp: 97.8 F (36.6 C)  98.2 F (36.8 C) 98.3 F (36.8 C)  TempSrc: Oral  Oral Oral  SpO2: 96%  93% 97%  Weight:  76.1 kg    Height:        General exam: Appears calm and comfortable ,Not in distress, obese HEENT:PERRL,Oral mucosa moist Respiratory system: Bilateral equal air entry, normal vesicular breath sounds, no wheezes or crackles  Cardiovascular system: S1 & S2 heard, RRR.  Gastrointestinal system: Abdomen is nondistended, soft and nontender. No organomegaly or masses felt. Normal bowel sounds heard. Central nervous system: Alert and oriented. No focal neurological deficits. Extremities: No edema, no clubbing ,no cyanosis, distal peripheral pulses palpable. Skin: No rashes, lesions or ulcers,no icterus ,no pallor MSK: Normal muscle bulk,tone ,power     Procedures:    None  The results of significant diagnostics from this hospitalization (including imaging, microbiology, ancillary and laboratory) are listed below for reference.     Diagnostic Studies:   Dg Chest 2 View  Result Date: 01/20/2019 CLINICAL DATA:  Hip pain. No reported chest symptoms. EXAM: CHEST - 2 VIEW COMPARISON:  06/01/2018. FINDINGS: Progressive enlargement of the cardiac silhouette. Mild increase in prominence of the pulmonary vasculature and interstitial markings. Interval minimal left pleural thickening or fluid. The inferior posterior costophrenic angles are not included on the lateral view. There is minimal patchy opacity at the posterior lung bases on that view. Thoracic spine degenerative changes. Diffuse osteopenia. Superior migration of both humeral heads, compatible with chronic rotator cuff tears. IMPRESSION: 1. Progressive cardiomegaly and minimal changes of congestive heart failure. 2. Minimal patchy atelectasis or pneumonia at the posterior lung bases. Electronically Signed   By:  Claudie Revering M.D.   On: 01/20/2019 10:45   Dg Hip Unilat W Or Wo Pelvis 2-3 Views Left  Result Date: 01/20/2019 CLINICAL DATA:  Lateral left hip pain. No known injury. EXAM: DG HIP (WITH OR WITHOUT PELVIS) 2-3V LEFT COMPARISON:  Right hip dated 05/25/2018. FINDINGS: Stable left hip total prosthesis, diffuse osteopenia and lower thoracic spine degenerative changes. No fracture or dislocation seen and no evidence of prosthetic loosening. Left acetabula protrusio is unchanged. Unremarkable right hip. Atheromatous arterial calcifications. IMPRESSION: 1. No acute abnormality. 2. Hardware intact. Electronically Signed   By: Claudie Revering M.D.   On: 01/20/2019 10:42   Vas [place] Lower Extremity Venous (dvt) (mc And Wl 7a-7p)  Result Date: 01/20/2019  Lower Venous Study Indications: Edema.  Comparison Study: 09/17/15 negative Performing Technologist: June Leap RDMS, RVT  Examination Guidelines: A complete evaluation includes B-mode imaging, spectral Doppler, color Doppler, and power Doppler as needed of all accessible portions of each vessel. Bilateral testing is considered an integral part of a complete examination. Limited examinations for reoccurring indications may be performed as noted.  +---------+---------------+---------+-----------+----------+-------+ RIGHT    CompressibilityPhasicitySpontaneityPropertiesSummary +---------+---------------+---------+-----------+----------+-------+ CFV      Full           Yes      Yes                          +---------+---------------+---------+-----------+----------+-------+  SFJ      Full                                                 +---------+---------------+---------+-----------+----------+-------+ FV Prox  Full                                                 +---------+---------------+---------+-----------+----------+-------+ FV Mid   Full                                                  +---------+---------------+---------+-----------+----------+-------+ FV DistalFull                                                 +---------+---------------+---------+-----------+----------+-------+ PFV      Full                                                 +---------+---------------+---------+-----------+----------+-------+ POP      Full           Yes      Yes                          +---------+---------------+---------+-----------+----------+-------+ PTV      Full                                                 +---------+---------------+---------+-----------+----------+-------+ PERO     Full                                                 +---------+---------------+---------+-----------+----------+-------+   +---------+---------------+---------+-----------+----------+-------+ LEFT     CompressibilityPhasicitySpontaneityPropertiesSummary +---------+---------------+---------+-----------+----------+-------+ CFV      Full           Yes      Yes                          +---------+---------------+---------+-----------+----------+-------+ SFJ      Full                                                 +---------+---------------+---------+-----------+----------+-------+ FV Prox  Full                                                 +---------+---------------+---------+-----------+----------+-------+  FV Mid   Full                                                 +---------+---------------+---------+-----------+----------+-------+ FV DistalFull                                                 +---------+---------------+---------+-----------+----------+-------+ PFV      Full                                                 +---------+---------------+---------+-----------+----------+-------+ POP      Full           Yes      Yes                          +---------+---------------+---------+-----------+----------+-------+ PTV      Full                                                  +---------+---------------+---------+-----------+----------+-------+ PERO     Full                                                 +---------+---------------+---------+-----------+----------+-------+     Summary: Right: There is no evidence of deep vein thrombosis in the lower extremity. No cystic structure found in the popliteal fossa. Left: There is no evidence of deep vein thrombosis in the lower extremity. No cystic structure found in the popliteal fossa.  *See table(s) above for measurements and observations. Electronically signed by Monica Martinez MD on 01/20/2019 at 1:37:00 PM.    Final      Labs:   Basic Metabolic Panel: Recent Labs  Lab 01/20/19 0918 01/21/19 0347 01/22/19 0405 01/24/19 0432  NA 145 144 142 144  K 3.8 3.5 3.2* 4.7  CL 107 107 105 112*  CO2 26 28 27 24   GLUCOSE 106* 95 92 107*  BUN 30* 27* 24* 25*  CREATININE 2.13* 1.56* 1.41* 1.33*  CALCIUM 9.4 8.8* 8.5* 9.4  MG  --   --  2.1 2.0  PHOS  --   --  3.4  --    GFR Estimated Creatinine Clearance: 27.5 mL/min (A) (by C-G formula based on SCr of 1.33 mg/dL (H)). Liver Function Tests: Recent Labs  Lab 01/20/19 0918  AST 25  ALT 15  ALKPHOS 41  BILITOT 0.7  PROT 5.9*  ALBUMIN 3.8   No results for input(s): LIPASE, AMYLASE in the last 168 hours. No results for input(s): AMMONIA in the last 168 hours. Coagulation profile Recent Labs  Lab 01/20/19 0934 01/21/19 0347 01/22/19 0405 01/23/19 0453 01/24/19 0432  INR 3.7* 4.0* 2.7* 2.3* 2.1*    CBC: Recent Labs  Lab 01/20/19 0918 01/22/19 0405 01/24/19 0432  WBC 4.6 4.6 5.1  NEUTROABS 3.1  --   --  HGB 12.0 11.8* 13.8  HCT 38.8 36.4 43.0  MCV 97.5 93.1 94.5  PLT 76* 75* 74*   Cardiac Enzymes: No results for input(s): CKTOTAL, CKMB, CKMBINDEX, TROPONINI in the last 168 hours. BNP: Invalid input(s): POCBNP CBG: No results for input(s): GLUCAP in the last 168 hours. D-Dimer No  results for input(s): DDIMER in the last 72 hours. Hgb A1c No results for input(s): HGBA1C in the last 72 hours. Lipid Profile No results for input(s): CHOL, HDL, LDLCALC, TRIG, CHOLHDL, LDLDIRECT in the last 72 hours. Thyroid function studies No results for input(s): TSH, T4TOTAL, T3FREE, THYROIDAB in the last 72 hours.  Invalid input(s): FREET3 Anemia work up No results for input(s): VITAMINB12, FOLATE, FERRITIN, TIBC, IRON, RETICCTPCT in the last 72 hours. Microbiology Recent Results (from the past 240 hour(s))  Urine culture     Status: None   Collection Time: 01/20/19 10:43 AM   Specimen: Urine, Clean Catch  Result Value Ref Range Status   Specimen Description URINE, CLEAN CATCH  Final   Special Requests   Final    NONE Performed at [facility], [address], [place], [place] [address]    Culture   Final    Multiple bacterial morphotypes present, none predominant. Suggest appropriate recollection if clinically indicated.   Report Status 01/21/2019 FINAL  Final  SARS Coronavirus 2 (CEPHEID - Performed in [place] hospital lab), Hosp Order     Status: None   Collection Time: 01/20/19 12:30 PM   Specimen: Nasopharyngeal Swab  Result Value Ref Range Status   SARS Coronavirus 2 NEGATIVE NEGATIVE Final    Comment: (NOTE) If result is NEGATIVE SARS-CoV-2 target nucleic acids are NOT DETECTED. The SARS-CoV-2 RNA is generally detectable in upper and lower  respiratory specimens during the acute phase of infection. The lowest  concentration of SARS-CoV-2 viral copies this assay can detect is 250  copies / mL. A negative result does not preclude SARS-CoV-2 infection  and should not be used as the sole basis for treatment or other  patient management decisions.  A negative result may occur with  improper specimen collection / handling, submission of specimen other  than nasopharyngeal swab, presence of viral mutation(s) within the  areas targeted by this assay, and  inadequate number of viral copies  (<250 copies / mL). A negative result must be combined with clinical  observations, patient history, and epidemiological information. If result is POSITIVE SARS-CoV-2 target nucleic acids are DETECTED. The SARS-CoV-2 RNA is generally detectable in upper and lower  respiratory specimens dur ing the acute phase of infection.  Positive  results are indicative of active infection with SARS-CoV-2.  Clinical  correlation with patient history and other diagnostic information is  necessary to determine patient infection status.  Positive results do  not rule out bacterial infection or co-infection with other viruses. If result is PRESUMPTIVE POSTIVE SARS-CoV-2 nucleic acids MAY BE PRESENT.   A presumptive positive result was obtained on the submitted specimen  and confirmed on repeat testing.  While 2019 novel coronavirus  (SARS-CoV-2) nucleic acids may be present in the submitted sample  additional confirmatory testing may be necessary for epidemiological  and / or clinical management purposes  to differentiate between  SARS-CoV-2 and other Sarbecovirus currently known to infect humans.  If clinically indicated additional testing with an alternate test  methodology [phone]) is advised. The SARS-CoV-2 RNA is generally  detectable in upper and lower respiratory sp ecimens during the acute  phase of infection.  The expected result is Negative. Fact Sheet for Patients:  StrictlyIdeas.no Fact Sheet for Healthcare Providers: BankingDealers.co.za This test is not yet approved or cleared by the [place] FDA and has been authorized for detection and/or diagnosis of SARS-CoV-2 by FDA under an Emergency Use Authorization (EUA).  This EUA will remain in effect (meaning this test can be used) for the duration of the COVID-19 declaration under Section 564(b)(1) of the Act, 21 U.S.C. section 360bbb-3(b)(1), unless the  authorization is terminated or revoked sooner. Performed at [facility], [place] [address], [place], [place] [address]      Discharge Instructions:   Discharge Instructions    (HEART FAILURE PATIENTS) Call MD:  Anytime you have any of the following symptoms: 1) 3 pound weight gain in 24 hours or 5 pounds in 1 week 2) shortness of breath, with or without a dry hacking cough 3) swelling in the hands, feet or stomach 4) if you have to sleep on extra pillows at night in order to breathe.   Complete by: As directed    Diet - low sodium heart healthy   Complete by: As directed    Discharge instructions   Complete by: As directed    Follow up with your primary care provider in one week for general checkup..  Continue to take your home medications.  You have been prescribed Lidoderm patch to apply in your hip.  Weigh your self every day.  Fluid restriction 1500 mL/day.  Low-salt diet.   Increase activity slowly   Complete by: As directed      Allergies as of 01/24/2019   No Known Allergies     Medication List    STOP taking these medications   cephALEXin 500 MG capsule Commonly known as: KEFLEX     TAKE these medications   acetaminophen 500 MG tablet Commonly known as: TYLENOL Take 1,000 mg by mouth every 8 (eight) hours as needed for headache (pain).   aspirin EC 81 MG tablet Take 81 mg by mouth daily.   atorvastatin 20 MG tablet Commonly known as: LIPITOR Take 1 tablet (20 mg total) by mouth daily.   calcium-vitamin D 500-200 MG-UNIT tablet Commonly known as: OSCAL WITH D Take 1 tablet by mouth daily with breakfast.   Ensure Take 237 mLs by mouth daily with breakfast. Milk chocolate   Fluticasone-Salmeterol 100-50 MCG/DOSE Aepb Commonly known as: [place] Inhub Inhale 1 puff into the lungs 2 (two) times daily.   furosemide 80 MG tablet Commonly known as: LASIX Take 1 tablet (80 mg total) by mouth daily.   isosorbide mononitrate 30 MG 24 hr tablet Commonly known  as: IMDUR Take 0.5 tablets (15 mg total) by mouth daily.   lidocaine 5 % Commonly known as: LIDODERM Place 1 patch onto the skin daily for 10 days. Remove & Discard patch within 12 hours or as directed by MD Start taking on: January 25, 2019   loratadine 10 MG tablet Commonly known as: CLARITIN Take 1 tablet (10 mg total) by mouth daily as needed (seasonal allergies).   metoprolol tartrate 25 MG tablet Commonly known as: LOPRESSOR Take 1.5 tablets (37.5 mg total) by mouth 2 (two) times daily.   multivitamin with minerals Tabs tablet Take 1 tablet by mouth daily.   nitroGLYCERIN 0.4 MG SL tablet Commonly known as: NITROSTAT Place 1 tablet (0.4 mg total) under the tongue every 5 (five) minutes as needed for chest pain.   polyethylene glycol 17 g packet Commonly known as:  MIRALAX / GLYCOLAX Take 17 g by mouth daily as needed.   albuterol (2.5 MG/3ML) 0.083% nebulizer solution Commonly known as: PROVENTIL Take 2.5 mg by nebulization every 6 (six) hours as needed for wheezing or shortness of breath.   ProAir HFA 108 (90 Base) MCG/ACT inhaler Generic drug: albuterol Inhale 2 puffs into the lungs every 6 (six) hours as needed for wheezing or shortness of breath.   promethazine 12.5 MG tablet Commonly known as: PHENERGAN Take 1 tablet (12.5 mg total) by mouth 3 (three) times daily as needed for nausea or vomiting.   QUEtiapine 25 MG tablet Commonly known as: SEROQUEL Take 1 tablet (25 mg total) by mouth at bedtime.   tamsulosin 0.4 MG Caps capsule Commonly known as: FLOMAX Take 1 capsule (0.4 mg total) by mouth daily. Start taking on: January 25, 2019   traMADol 50 MG tablet Commonly known as: ULTRAM Take 1 tablet (50 mg total) by mouth every 6 (six) hours as needed for moderate pain.   verapamil 120 MG CR tablet Commonly known as: CALAN-SR Take 1 tablet (120 mg total) by mouth at bedtime.   warfarin 2.5 MG tablet Commonly known as: COUMADIN Take as directed. If you are  unsure how to take this medication, talk to your nurse or doctor. Original instructions: USE AS DIRECTED BY COUMADIN CLINIC What changed:   how much to take  how to take this  additional instructions       Time coordinating discharge: 39 minutes  Signed:  Loretta Doutt  [facility] 01/24/2019, 11:26 AM

## 2019-01-24 NOTE — Consult Note (Signed)
   [facility] CM Inpatient Consult   01/24/2019  [place] [birth year] [id number]   Follow up:  Patient is transitioning home.  Call placed to [name], daughter, to follow up on needs for care management services.  HIPAA verified.  [name] states that she believes everything and home health is in place and no additional follow up was needed.  She states she will pick [her] mom's medications tomorrow.  She states her mom is being transported via [place].  She states she has the information regarding [place] [facility] in her mother's journal and will call if additional needs are warranted.   Will follow with General EMMI calls.  For questions, please contact:  Natividad Brood, RN BSN [facility] Liaison  [phone] business mobile phone Toll free office [phone]  Fax number: [phone] [email or web address]

## 2019-01-24 NOTE — Progress Notes (Signed)
ANTICOAGULATION CONSULT NOTE - Follow Up Consult  Pharmacy Consult for Coumadin Indication: Afib/CVA and DVT/PE  No Known Allergies  Patient Measurements: Height: 5\' 3"  (160 cm) Weight: 167 lb 11.2 oz (76.1 kg)(scale c) IBW/kg (Calculated) : 52.4   Vital Signs: Temp: 98.2 F (36.8 C) (07/21 0421) Temp Source: Oral (07/21 0421) BP: 141/93 (07/21 0421) Pulse Rate: 75 (07/21 0421)  Labs: Recent Labs    01/22/19 0405 01/23/19 0453 01/24/19 0432  HGB 11.8*  --  13.8  HCT 36.4  --  43.0  PLT 75*  --  74*  LABPROT 28.1* 24.9* 23.3*  INR 2.7* 2.3* 2.1*  CREATININE 1.41*  --  1.33*    Estimated Creatinine Clearance: 27.5 mL/min (A) (by C-G formula based on SCr of 1.33 mg/dL (H)).   Assessment: Anticoag: Warfarin PTA for hx afib/CVA and DVT/PEs (last dose 7/16). INR 2.1.  Hgb 13.8 up. Plts 74 chronic. No Coumadin 7/17 or 7/18. Dose PTA = 2.5 mg daily except 5 mg one day per week with admit INR 3.7  Goal of Therapy:  INR 2-3 Monitor platelets by anticoagulation protocol: Yes   Plan:  Warfarin 5 mg x1 again Daily INR D/c scheduled Kdur vs decrease dose?  Lytle [place] S. Alford Highland, PharmD, BCPS Clinical Staff Pharmacist Eilene Ghazi Stillinger 01/24/2019,9:29 AM

## 2019-01-24 NOTE — TOC Transition Note (Signed)
Transition of Care [facility]) - CM/SW Discharge Note   Patient Details  Name: [name] MRN: [id number] Date of Birth: 04-05-1927  [facility]) CM/SW Contact:  Sharin Mons, RN Phone Number: 01/24/2019, 12:44 PM   Clinical Narrative:    Admitted with CHF.  Plan: Pt will transition to home today. [facility] will provide RN,PT, and OT services, SOC within 48 hrs.  Pt will need PTAR services for transportation to home. Transportation paperwork place on front of pt's chart.  [name] (Daughter)     [phone]       Final next level of care: [place] Barriers to Discharge: No Barriers Identified   Patient Goals and CMS Choice Patient states their goals for this hospitalization and ongoing recovery are:: to go home CMS Medicare.gov Compare Post Acute Care list provided to:: Patient Choice offered to / list presented to : Patient, Adult Children  Discharge Placement   Discharge Plan and Services   Discharge Planning Services: CM Consult            DME Arranged: N/A DME Agency: NA       HH Arranged: RN, PT, OT [place] Agency: [facility] (formerly [employer]) Date [place]: 01/24/19 Time [place]: 1219 Representative spoke with at [place]: [place] ([place]) Interventions     Readmission Risk Interventions No flowsheet data found.

## 2019-01-24 NOTE — Progress Notes (Signed)
Patient lart and oriented, v/s stable, iv and tele d/c. Discharge instruction explain to the daughter on phone, all questions answered, copy of d/c instruction  in envelope will send home with patient..will d/c patient home by ambulance per order

## 2019-01-24 NOTE — Care Management Important Message (Signed)
Important Message  Patient Details  Name: [name] MRN: [id number] Date of Birth: Dec 02, 1926   Medicare Important Message Given:  Yes     Shelda Altes 01/24/2019, 11:25 AM

## 2019-01-24 NOTE — Progress Notes (Addendum)
Physical Therapy Treatment Patient Details Name: [name] MRN: [id number] DOB: 1926-09-08 Today's Date: 01/24/2019    History of Present Illness 85 yo admitted with SOB, AMS, fluid retention and left hip pain with CHF exacerbation. PMhx: CHF, Afib, CKD, HTN, CVA, suizures, pulmonary HTN, CAD    PT Comments    Pt stating "my heart's gone" she seems convinced she is dying soon. Pt reassured and showed cardiac monitor to witness HR of 101. Pt incontinent on arrival and unaware of soaked linens but able to state incontinence with use of adult briefs for all times at home. Pt assisted for pericare and linen change with assist for gait. Pt pleasant and in chair to eat end of session.    Concern for home is pt lack of activity tolerance with flight of stairs to enter home per pt report. Pt will need to demonstrate ability to climb stairs as well as family ability to provide supervision prior to return home.    Follow Up Recommendations  Home health PT;Supervision for mobility/OOB (pending family assist and ability to enter home with stairs pt may need SNF)     Equipment Recommendations  None recommended by PT    Recommendations for Other Services       Precautions / Restrictions Precautions Precautions: Fall Precaution Comments: incontinent Restrictions Weight Bearing Restrictions: No    Mobility  Bed Mobility Overal bed mobility: Needs Assistance Bed Mobility: Supine to Sit     Supine to sit: Min guard;HOB elevated     General bed mobility comments: HOB 20 degrees with increased time and effort to pivot to EOB with all linens wet and pt unaware  Transfers Overall transfer level: Needs assistance   Transfers: Sit to/from Stand;Stand Pivot Transfers Sit to Stand: Min guard Stand pivot transfers: Min assist       General transfer comment: min guard for safety and balance, increased time and effort required with pt able to rise from bed and pivot to [facility] with assist for  balance and cues for hand placement and safety  Ambulation/Gait Ambulation/Gait assistance: Min guard Gait Distance (Feet): 100 Feet Assistive device: Rolling walker (2 wheeled) Gait Pattern/deviations: Step-through pattern;Decreased stride length;Decreased stance time - left   Gait velocity interpretation: 1.31 - 2.62 ft/sec, indicative of limited community ambulator General Gait Details: pt with decreased stance time on LLE with maintained kyphosis with cues for position in RW and reliance on RW, limited by fatigue   Stairs             Wheelchair Mobility    Modified Rankin (Stroke Patients Only)       Balance Overall balance assessment: Needs assistance Sitting-balance support: No upper extremity supported;Feet supported Sitting balance-Leahy Scale: Good Sitting balance - Comments: EOB on BSC without assist   Standing balance support: Bilateral upper extremity supported;Single extremity supported;During functional activity Standing balance-Leahy Scale: Poor Standing balance comment: reliant on UE for transition to standing, pivot and gait                            Cognition Arousal/Alertness: Awake/alert Behavior During Therapy: WFL for tasks assessed/performed Overall Cognitive Status: Impaired/Different from baseline Area of Impairment: Memory                     Memory: Decreased short-term memory                Exercises      General  Comments        Pertinent Vitals/Pain Pain Score: 4  Pain Location: L hip Pain Descriptors / Indicators: Aching Pain Intervention(s): Limited activity within patient's tolerance;Monitored during session;Repositioned    Home Living                      Prior Function            PT Goals (current goals can now be found in the care plan section) Progress towards PT goals: Progressing toward goals    Frequency           PT Plan Current plan remains appropriate     Co-evaluation              AM-PAC PT "6 Clicks" Mobility   Outcome Measure  Help needed turning from your back to your side while in a flat bed without using bedrails?: A Little Help needed moving from lying on your back to sitting on the side of a flat bed without using bedrails?: A Little Help needed moving to and from a bed to a chair (including a wheelchair)?: A Little Help needed standing up from a chair using your arms (e.g., wheelchair or bedside chair)?: A Little Help needed to walk in hospital room?: A Little Help needed climbing 3-5 steps with a railing? : A Little 6 Click Score: 18    End of Session Equipment Utilized During Treatment: Gait belt Activity Tolerance: Patient tolerated treatment well Patient left: in chair;with chair alarm set;with call bell/phone within reach Nurse Communication: Mobility status PT Visit Diagnosis: Other abnormalities of gait and mobility (R26.89);Muscle weakness (generalized) (M62.81);Unsteadiness on feet (R26.81)     Time: 4142-3953 PT Time Calculation (min) (ACUTE ONLY): 24 min  Charges:  $Gait Training: 8-22 mins $Therapeutic Activity: 8-22 mins                     [place], PT Acute Rehabilitation Services Pager: [phone] Office: [place] 01/24/2019, 1:17 PM

## 2019-01-26 ENCOUNTER — Telehealth: Payer: Self-pay | Admitting: *Deleted

## 2019-01-26 NOTE — Telephone Encounter (Signed)
Unable to reach pt. Hospital follow up in person has been scheduled for 01/31/19.

## 2019-01-30 ENCOUNTER — Other Ambulatory Visit: Payer: Self-pay | Admitting: Internal Medicine

## 2019-01-30 ENCOUNTER — Encounter: Payer: Self-pay | Admitting: Internal Medicine

## 2019-01-31 ENCOUNTER — Inpatient Hospital Stay: Payer: Medicare Other | Admitting: Internal Medicine

## 2019-02-06 ENCOUNTER — Encounter: Payer: Self-pay | Admitting: Internal Medicine

## 2019-02-06 DIAGNOSIS — I10 Essential (primary) hypertension: Secondary | ICD-10-CM

## 2019-02-06 DIAGNOSIS — N390 Urinary tract infection, site not specified: Secondary | ICD-10-CM

## 2019-02-06 DIAGNOSIS — R627 Adult failure to thrive: Secondary | ICD-10-CM

## 2019-02-06 DIAGNOSIS — I5030 Unspecified diastolic (congestive) heart failure: Secondary | ICD-10-CM

## 2019-02-06 DIAGNOSIS — R443 Hallucinations, unspecified: Secondary | ICD-10-CM

## 2019-02-06 DIAGNOSIS — I4811 Longstanding persistent atrial fibrillation: Secondary | ICD-10-CM

## 2019-02-06 DIAGNOSIS — N182 Chronic kidney disease, stage 2 (mild): Secondary | ICD-10-CM

## 2019-02-17 ENCOUNTER — Other Ambulatory Visit: Payer: Self-pay

## 2019-02-23 ENCOUNTER — Encounter: Payer: Self-pay | Admitting: Internal Medicine

## 2019-02-24 ENCOUNTER — Other Ambulatory Visit: Payer: Self-pay | Admitting: Internal Medicine

## 2019-02-24 ENCOUNTER — Inpatient Hospital Stay: Payer: Medicare Other | Admitting: Internal Medicine

## 2019-02-24 MED ORDER — DULERA 100-5 MCG/ACT IN AERO: 2.0000 | INHALATION_SPRAY | Freq: Two times a day (BID) | RESPIRATORY_TRACT | 6 refills | Status: DC

## 2019-02-28 ENCOUNTER — Encounter: Payer: Medicare Other | Admitting: Internal Medicine

## 2019-03-01 ENCOUNTER — Other Ambulatory Visit: Payer: Self-pay

## 2019-03-07 ENCOUNTER — Other Ambulatory Visit: Payer: Self-pay

## 2019-03-07 ENCOUNTER — Encounter: Payer: Self-pay | Admitting: Internal Medicine

## 2019-03-07 ENCOUNTER — Ambulatory Visit (INDEPENDENT_AMBULATORY_CARE_PROVIDER_SITE_OTHER): Payer: Medicare Other | Admitting: Internal Medicine

## 2019-03-07 VITALS — BP 133/60 | HR 78 | Temp 96.4°F | Resp 18 | Ht 63.0 in

## 2019-03-07 DIAGNOSIS — J4520 Mild intermittent asthma, uncomplicated: Secondary | ICD-10-CM | POA: Diagnosis not present

## 2019-03-07 DIAGNOSIS — I10 Essential (primary) hypertension: Secondary | ICD-10-CM | POA: Diagnosis not present

## 2019-03-07 DIAGNOSIS — I4811 Longstanding persistent atrial fibrillation: Secondary | ICD-10-CM

## 2019-03-07 DIAGNOSIS — R600 Localized edema: Secondary | ICD-10-CM

## 2019-03-07 DIAGNOSIS — I25118 Atherosclerotic heart disease of native coronary artery with other forms of angina pectoris: Secondary | ICD-10-CM

## 2019-03-07 DIAGNOSIS — G3184 Mild cognitive impairment, so stated: Secondary | ICD-10-CM | POA: Insufficient documentation

## 2019-03-07 DIAGNOSIS — D696 Thrombocytopenia, unspecified: Secondary | ICD-10-CM

## 2019-03-07 DIAGNOSIS — Z23 Encounter for immunization: Secondary | ICD-10-CM | POA: Diagnosis not present

## 2019-03-07 DIAGNOSIS — Z7901 Long term (current) use of anticoagulants: Secondary | ICD-10-CM

## 2019-03-07 LAB — COMPREHENSIVE METABOLIC PANEL
ALT: 10 U/L (ref 0–35)
AST: 17 U/L (ref 0–37)
Albumin: 4.1 g/dL (ref 3.5–5.2)
Alkaline Phosphatase: 43 U/L (ref 39–117)
BUN: 26 mg/dL — ABNORMAL HIGH (ref 6–23)
CO2: 30 mEq/L (ref 19–32)
Calcium: 9.7 mg/dL (ref 8.4–10.5)
Chloride: 106 mEq/L (ref 96–112)
Creatinine, Ser: 1.57 mg/dL — ABNORMAL HIGH (ref 0.40–1.20)
GFR: 30.93 mL/min — ABNORMAL LOW (ref >60.00)
Glucose, Bld: 104 mg/dL — ABNORMAL HIGH (ref 70–99)
Potassium: 4 mEq/L (ref 3.5–5.1)
Sodium: 146 mEq/L — ABNORMAL HIGH (ref 135–145)
Total Bilirubin: 0.8 mg/dL (ref 0.2–1.2)
Total Protein: 6.6 g/dL (ref 6.0–8.3)

## 2019-03-07 LAB — CBC WITH DIFFERENTIAL/PLATELET
Basophils Absolute: 0 10*3/uL (ref 0.0–0.1)
Basophils Relative: 0.5 % (ref 0.0–3.0)
Eosinophils Absolute: 0.2 10*3/uL (ref 0.0–0.7)
Eosinophils Relative: 2.6 % (ref 0.0–5.0)
HCT: 40.5 % (ref 36.0–46.0)
Hemoglobin: 13.3 g/dL (ref 12.0–15.0)
Lymphocytes Relative: 15.4 % (ref 12.0–46.0)
Lymphs Abs: 1 10*3/uL (ref 0.7–4.0)
MCHC: 32.8 g/dL (ref 30.0–36.0)
MCV: 93.6 fl (ref 78.0–100.0)
Monocytes Absolute: 0.5 10*3/uL (ref 0.1–1.0)
Monocytes Relative: 7.6 % (ref 3.0–12.0)
Neutro Abs: 4.6 10*3/uL (ref 1.4–7.7)
Neutrophils Relative %: 73.9 % (ref 43.0–77.0)
Platelets: 89 10*3/uL — ABNORMAL LOW (ref 150.0–400.0)
RBC: 4.33 Mil/uL (ref 3.87–5.11)
RDW: 16.4 % — ABNORMAL HIGH (ref 11.5–15.5)
WBC: 6.3 10*3/uL (ref 4.0–10.5)

## 2019-03-07 LAB — POCT INR: INR: 2.5 (ref 2.0–3.0)

## 2019-03-07 NOTE — Assessment & Plan Note (Signed)
HTN: Seems controlled, continue Lasix, Imdur, metoprolol.  Check CMP. Lower extremity edema: Worse on the right, Lasix dose was 80 mg daily and since 02/23/2019, she is taking extra 40 mg Monday Wednesday and Friday.  Checking labs today.  Leg elevation is strongly recommended.  Stay on present dose of Lasix if BMP okay. Atrial fibrillation: On Coumadin, only taking 2.5 mg daily, INR 2.5. Recheck in 1 month Confusion/agitation: Seroquel helping well.  Refill as needed MCI: Not a major issue at this point.  Was seen 05-2018 by neurology, no medication recommended at that time Asthma: Well controlled with Dulera Thrombocytopenia: Recheck a CBC Flu shot today RTC Coumadin 1 month Follow-up with me 3 months

## 2019-03-07 NOTE — Progress Notes (Signed)
Pre visit review using our clinic review tool, if applicable. No additional management support is needed unless otherwise documented below in the visit note. 

## 2019-03-07 NOTE — Progress Notes (Signed)
Subjective:    Patient ID: [name], female    DOB: 1926-09-19, 85 y.o.   MRN: [id number]  DOS:  03/07/2019 Type of visit - description: Follow-up Here with her daughter Agitation: Much better with medications. Lower extremity edema: Lasix dose was adjusted, right leg continues to be swollen more than the left but overall improved. Asthma: Currently better with [facility] Urinary retention, went to the ER 6 weeks ago, no further problems, on Flomax.    Review of Systems Denies fever chills Appetite is very good No chest pain.  No shortness of breath at rest, still has DOE but better than before. Had diarrhea last week, resolved. No nausea or vomiting  Past Medical History:  Diagnosis Date  . Acute encephalopathy   . Acute renal failure superimposed on stage 3 chronic kidney disease ([place])   . Anemia   . Atrial fibrillation ([place])   . Colon cancer ([place]) 12/11  . Colon cancer ([place]) 2011  . Diarrhea   . DVT (deep venous thrombosis) ([place]) 11/06  . Elevated brain natriuretic peptide (BNP) level   . Frequent headaches   . Heart murmur   . Hyperkalemia   . Hypertension   . Pulmonary embolism ([place]) 11/06  . Seizures ([place])   . Stroke ([place]) 11/30/2014  . Urine retention 01/2019    Past Surgical History:  Procedure Laterality Date  . APPENDECTOMY  1952  . COLECTOMY  06/2010   partial, Dr. Donne Hazel complicated by LLE CVT; S/P IVC umbrella & anemia  . HIP FRACTURE SURGERY  2006   Trauma  . TOTAL ABDOMINAL HYSTERECTOMY W/ BILATERAL SALPINGOOPHORECTOMY  1973   For Fibroids  . TOTAL HIP ARTHROPLASTY  01/03/07   Left hip replacement.  Marland Kitchen VENA CAVA FILTER PLACEMENT  06/28/2010    Social History   Socioeconomic History  . Marital status: Widowed    Spouse name: Not on file  . Number of children: 1  . Years of education: Not on file  . Highest education level: Not on file  Occupational History  . Occupation: n/d    Employer: RETIRED  Social Needs  . Financial resource  strain: Not on file  . Food insecurity    Worry: Not on file    Inability: Not on file  . Transportation needs    Medical: Not on file    Non-medical: Not on file  Tobacco Use  . Smoking status: Passive Smoke Exposure - Never Smoker  . Smokeless tobacco: Never Used  . Tobacco comment: husband smoked in home.   Substance and Sexual Activity  . Alcohol use: No    Alcohol/week: 0.0 standard drinks  . Drug use: No  . Sexual activity: Not Currently  Lifestyle  . Physical activity    Days per week: Not on file    Minutes per session: Not on file  . Stress: Not on file  Relationships  . Social Herbalist on phone: Not on file    Gets together: Not on file    Attends religious service: Not on file    Active member of club or organization: Not on file    Attends meetings of clubs or organizations: Not on file    Relationship status: Not on file  . Intimate partner violence    Fear of current or ex partner: Not on file    Emotionally abused: Not on file    Physically abused: Not on file    Forced sexual activity: Not on  file  Other Topics Concern  . Not on file  Social History Narrative   Lives w/ her daughter       Allergies as of 03/07/2019   No Known Allergies     Medication List       Accurate as of March 07, 2019  2:21 PM. If you have any questions, ask your nurse or doctor.        acetaminophen 500 MG tablet Commonly known as: TYLENOL Take 1,000 mg by mouth every 8 (eight) hours as needed for headache (pain).   aspirin EC 81 MG tablet Take 81 mg by mouth daily.   atorvastatin 20 MG tablet Commonly known as: LIPITOR Take 1 tablet (20 mg total) by mouth daily.   calcium-vitamin D 500-200 MG-UNIT tablet Commonly known as: OSCAL WITH D Take 1 tablet by mouth daily with breakfast.   Dulera 100-5 MCG/ACT Aero Generic drug: mometasone-formoterol Inhale 2 puffs into the lungs 2 (two) times daily.   Ensure Take 237 mLs by mouth daily with  breakfast. Milk chocolate   furosemide 80 MG tablet Commonly known as: LASIX Take 1 tablet (80 mg total) by mouth daily. What changed: additional instructions   isosorbide mononitrate 30 MG 24 hr tablet Commonly known as: IMDUR Take 0.5 tablets (15 mg total) by mouth daily.   loratadine 10 MG tablet Commonly known as: CLARITIN Take 1 tablet (10 mg total) by mouth daily as needed (seasonal allergies).   metoprolol tartrate 25 MG tablet Commonly known as: LOPRESSOR Take 1.5 tablets (37.5 mg total) by mouth 2 (two) times daily.   multivitamin with minerals Tabs tablet Take 1 tablet by mouth daily.   nitroGLYCERIN 0.4 MG SL tablet Commonly known as: NITROSTAT Place 1 tablet (0.4 mg total) under the tongue every 5 (five) minutes as needed for chest pain.   polyethylene glycol 17 g packet Commonly known as: MIRALAX / GLYCOLAX Take 17 g by mouth daily as needed.   albuterol (2.5 MG/3ML) 0.083% nebulizer solution Commonly known as: PROVENTIL Take 2.5 mg by nebulization every 6 (six) hours as needed for wheezing or shortness of breath.   ProAir HFA 108 (90 Base) MCG/ACT inhaler Generic drug: albuterol Inhale 2 puffs into the lungs every 6 (six) hours as needed for wheezing or shortness of breath.   promethazine 12.5 MG tablet Commonly known as: PHENERGAN Take 1 tablet (12.5 mg total) by mouth 3 (three) times daily as needed for nausea or vomiting.   QUEtiapine 25 MG tablet Commonly known as: SEROQUEL Take 2 tablets (50 mg total) by mouth at bedtime.   tamsulosin 0.4 MG Caps capsule Commonly known as: FLOMAX Take 1 capsule (0.4 mg total) by mouth daily.   traMADol 50 MG tablet Commonly known as: ULTRAM Take 1 tablet (50 mg total) by mouth every 6 (six) hours as needed for moderate pain.   verapamil 120 MG 24 hr capsule Commonly known as: VERELAN PM Take 120 mg by mouth at bedtime. What changed: Another medication with the same name was removed. Continue taking this  medication, and follow the directions you see here. Changed by: Kathlene November, MD   warfarin 2.5 MG tablet Commonly known as: COUMADIN Take as directed by the anticoagulation clinic. If you are unsure how to take this medication, talk to your nurse or doctor. Original instructions: USE AS DIRECTED BY COUMADIN CLINIC What changed:   how much to take  how to take this  additional instructions  Objective:   Physical Exam BP 133/60 (BP Location: Right Arm, Patient Position: Sitting, Cuff Size: Small)   Pulse 78   Temp (!) 96.4 F (35.8 C) (Temporal)   Resp 18   Ht 5\' 3"  (1.6 m)   SpO2 97%   BMI 29.71 kg/m  General:   Well developed, NAD, BMI noted.  HEENT:  Normocephalic . Face symmetric, atraumatic Lungs:  CTA B Normal respiratory effort, no intercostal retractions, no accessory muscle use. Heart: History of atrial fibrillation but on today's exam she seems regular   Soft pitting edema, worse on the right leg Abdomen:  Not distended, soft, non-tender. No rebound or rigidity.   Skin: Not pale. Not jaundice Neurologic:  alert & oriented X3.  Speech normal, gait not tested, sitting in a wheelchair. Psych--  Cognition and judgment appear intact.  Cooperative with normal attention span and concentration.  Behavior appropriate. No anxious or depressed appearing.     Assessment     Assessment(transfer Dr Linna Darner 04-2015) HTN Hyperlipidemia insomnia - xanax prn Asthma  CV: --DVT and pulmonary emboli 2006, vena cava filter 2011  -Atrial fibrillation--rate controlled, anticoag -Edema R>L: Ultrasound negative for DVT 01-2019.  Echo 01/21/2019: EF 60%, moderate pulmonary hypertension, see full report - Rx Imdur since admission 06/2018 Neuro --Stroke 11-2014, no residual deficits (TIA?) --Confusion, hallucinations, rx Seroquel 03-2018 pere pcp.  Seen by neurology 05-2018 --MCI (saw neurology 05-2018, no medication recommended at that time MSK: tramadol qd prn  --DJD --Back pain has seen Dr Nelva Bush, dr Rolena Infante, MRI: djd, stenosis, med treatment Colon [id number] -- Dr Annitta Jersey , d/c from oncology 06-2015 , No further colonoscopies Thrombocytopenia: Mild, fluctuating  PLAN HTN: Seems controlled, continue Lasix, Imdur, metoprolol.  Check CMP. Lower extremity edema: Worse on the right, Lasix dose was 80 mg daily and since 02/23/2019, she is taking extra 40 mg Monday Wednesday and Friday.  Checking labs today.  Leg elevation is strongly recommended.  Stay on present dose of Lasix if BMP okay. Atrial fibrillation: On Coumadin, only taking 2.5 mg daily, INR 2.5. Recheck in 1 month Confusion/agitation: Seroquel helping well.  Refill as needed MCI: Not a major issue at this point.  Was seen 05-2018 by neurology, no medication recommended at that time Asthma: Well controlled with Dulera Thrombocytopenia: Recheck a CBC Flu shot today RTC Coumadin 1 month Follow-up with me 3 months

## 2019-03-07 NOTE — Patient Instructions (Signed)
GO TO THE LAB : Get the blood work     GO TO THE FRONT DESK Schedule your next appointment for Coumadin check in 1 month  Next visit with me in 3 months, please make an appointment  Continue same medications  Leg elevation at least 1 hour twice a day

## 2019-03-20 ENCOUNTER — Other Ambulatory Visit: Payer: Self-pay

## 2019-03-20 MED ORDER — TAMSULOSIN HCL 0.4 MG PO CAPS: 0.4000 mg | ORAL_CAPSULE | Freq: Every day | ORAL | 1 refills | Status: DC

## 2019-03-20 MED ORDER — QUETIAPINE FUMARATE 25 MG PO TABS: 50.0000 mg | ORAL_TABLET | Freq: Every day | ORAL | 1 refills | Status: DC

## 2019-03-22 ENCOUNTER — Encounter: Payer: Self-pay | Admitting: Internal Medicine

## 2019-04-07 ENCOUNTER — Emergency Department (HOSPITAL_COMMUNITY): Payer: Medicare Other

## 2019-04-07 ENCOUNTER — Encounter (HOSPITAL_COMMUNITY): Payer: Self-pay | Admitting: Emergency Medicine

## 2019-04-07 ENCOUNTER — Other Ambulatory Visit: Payer: Self-pay

## 2019-04-07 ENCOUNTER — Inpatient Hospital Stay (HOSPITAL_COMMUNITY)
Admission: EM | Admit: 2019-04-07 | Discharge: 2019-04-17 | DRG: 682 | Disposition: A | Discharge status: Skilled Nursing Facility | Payer: Medicare Other | Attending: Internal Medicine | Admitting: Internal Medicine

## 2019-04-07 DIAGNOSIS — R0902 Hypoxemia: Secondary | ICD-10-CM | POA: Diagnosis not present

## 2019-04-07 DIAGNOSIS — I5032 Chronic diastolic (congestive) heart failure: Secondary | ICD-10-CM | POA: Diagnosis not present

## 2019-04-07 DIAGNOSIS — Z20828 Contact with and (suspected) exposure to other viral communicable diseases: Secondary | ICD-10-CM | POA: Diagnosis not present

## 2019-04-07 DIAGNOSIS — G9341 Metabolic encephalopathy: Secondary | ICD-10-CM | POA: Diagnosis not present

## 2019-04-07 DIAGNOSIS — Z833 Family history of diabetes mellitus: Secondary | ICD-10-CM

## 2019-04-07 DIAGNOSIS — I959 Hypotension, unspecified: Secondary | ICD-10-CM | POA: Diagnosis not present

## 2019-04-07 DIAGNOSIS — K5900 Constipation, unspecified: Secondary | ICD-10-CM | POA: Diagnosis present

## 2019-04-07 DIAGNOSIS — E785 Hyperlipidemia, unspecified: Secondary | ICD-10-CM | POA: Diagnosis present

## 2019-04-07 DIAGNOSIS — M419 Scoliosis, unspecified: Secondary | ICD-10-CM | POA: Diagnosis present

## 2019-04-07 DIAGNOSIS — E870 Hyperosmolality and hypernatremia: Secondary | ICD-10-CM | POA: Diagnosis present

## 2019-04-07 DIAGNOSIS — E876 Hypokalemia: Secondary | ICD-10-CM | POA: Diagnosis present

## 2019-04-07 DIAGNOSIS — I130 Hypertensive heart and chronic kidney disease with heart failure and stage 1 through stage 4 chronic kidney disease, or unspecified chronic kidney disease: Secondary | ICD-10-CM | POA: Diagnosis not present

## 2019-04-07 DIAGNOSIS — Z8249 Family history of ischemic heart disease and other diseases of the circulatory system: Secondary | ICD-10-CM

## 2019-04-07 DIAGNOSIS — I10 Essential (primary) hypertension: Secondary | ICD-10-CM | POA: Diagnosis present

## 2019-04-07 DIAGNOSIS — N200 Calculus of kidney: Secondary | ICD-10-CM | POA: Diagnosis present

## 2019-04-07 DIAGNOSIS — Z7901 Long term (current) use of anticoagulants: Secondary | ICD-10-CM

## 2019-04-07 DIAGNOSIS — M5136 Other intervertebral disc degeneration, lumbar region: Secondary | ICD-10-CM | POA: Diagnosis present

## 2019-04-07 DIAGNOSIS — N189 Chronic kidney disease, unspecified: Secondary | ICD-10-CM

## 2019-04-07 DIAGNOSIS — N183 Chronic kidney disease, stage 3 unspecified: Secondary | ICD-10-CM | POA: Diagnosis present

## 2019-04-07 DIAGNOSIS — M545 Low back pain, unspecified: Secondary | ICD-10-CM

## 2019-04-07 DIAGNOSIS — G3184 Mild cognitive impairment, so stated: Secondary | ICD-10-CM | POA: Diagnosis present

## 2019-04-07 DIAGNOSIS — M25551 Pain in right hip: Secondary | ICD-10-CM | POA: Diagnosis not present

## 2019-04-07 DIAGNOSIS — R52 Pain, unspecified: Secondary | ICD-10-CM | POA: Diagnosis not present

## 2019-04-07 DIAGNOSIS — M25552 Pain in left hip: Secondary | ICD-10-CM | POA: Diagnosis not present

## 2019-04-07 DIAGNOSIS — N3000 Acute cystitis without hematuria: Secondary | ICD-10-CM | POA: Diagnosis present

## 2019-04-07 DIAGNOSIS — I2510 Atherosclerotic heart disease of native coronary artery without angina pectoris: Secondary | ICD-10-CM | POA: Diagnosis present

## 2019-04-07 DIAGNOSIS — Z7722 Contact with and (suspected) exposure to environmental tobacco smoke (acute) (chronic): Secondary | ICD-10-CM | POA: Diagnosis present

## 2019-04-07 DIAGNOSIS — I270 Primary pulmonary hypertension: Secondary | ICD-10-CM | POA: Diagnosis present

## 2019-04-07 DIAGNOSIS — Z96642 Presence of left artificial hip joint: Secondary | ICD-10-CM | POA: Diagnosis present

## 2019-04-07 DIAGNOSIS — D696 Thrombocytopenia, unspecified: Secondary | ICD-10-CM | POA: Diagnosis present

## 2019-04-07 DIAGNOSIS — N179 Acute kidney failure, unspecified: Principal | ICD-10-CM

## 2019-04-07 DIAGNOSIS — Z03818 Encounter for observation for suspected exposure to other biological agents ruled out: Secondary | ICD-10-CM | POA: Diagnosis not present

## 2019-04-07 DIAGNOSIS — N289 Disorder of kidney and ureter, unspecified: Secondary | ICD-10-CM | POA: Diagnosis not present

## 2019-04-07 DIAGNOSIS — I4821 Permanent atrial fibrillation: Secondary | ICD-10-CM | POA: Diagnosis not present

## 2019-04-07 DIAGNOSIS — Z7982 Long term (current) use of aspirin: Secondary | ICD-10-CM

## 2019-04-07 DIAGNOSIS — F0390 Unspecified dementia without behavioral disturbance: Secondary | ICD-10-CM | POA: Diagnosis present

## 2019-04-07 DIAGNOSIS — I4891 Unspecified atrial fibrillation: Secondary | ICD-10-CM | POA: Diagnosis present

## 2019-04-07 DIAGNOSIS — M25559 Pain in unspecified hip: Secondary | ICD-10-CM | POA: Diagnosis present

## 2019-04-07 DIAGNOSIS — Z90722 Acquired absence of ovaries, bilateral: Secondary | ICD-10-CM

## 2019-04-07 DIAGNOSIS — Z85038 Personal history of other malignant neoplasm of large intestine: Secondary | ICD-10-CM

## 2019-04-07 DIAGNOSIS — Z86711 Personal history of pulmonary embolism: Secondary | ICD-10-CM

## 2019-04-07 DIAGNOSIS — E860 Dehydration: Secondary | ICD-10-CM | POA: Diagnosis present

## 2019-04-07 DIAGNOSIS — R262 Difficulty in walking, not elsewhere classified: Secondary | ICD-10-CM | POA: Diagnosis present

## 2019-04-07 DIAGNOSIS — Z90710 Acquired absence of both cervix and uterus: Secondary | ICD-10-CM

## 2019-04-07 DIAGNOSIS — Z86718 Personal history of other venous thrombosis and embolism: Secondary | ICD-10-CM

## 2019-04-07 DIAGNOSIS — I509 Heart failure, unspecified: Secondary | ICD-10-CM

## 2019-04-07 DIAGNOSIS — L89309 Pressure ulcer of unspecified buttock, unspecified stage: Secondary | ICD-10-CM | POA: Diagnosis present

## 2019-04-07 DIAGNOSIS — Z9049 Acquired absence of other specified parts of digestive tract: Secondary | ICD-10-CM

## 2019-04-07 DIAGNOSIS — R0600 Dyspnea, unspecified: Secondary | ICD-10-CM

## 2019-04-07 DIAGNOSIS — B961 Klebsiella pneumoniae [K. pneumoniae] as the cause of diseases classified elsewhere: Secondary | ICD-10-CM | POA: Diagnosis present

## 2019-04-07 DIAGNOSIS — J45909 Unspecified asthma, uncomplicated: Secondary | ICD-10-CM | POA: Diagnosis present

## 2019-04-07 DIAGNOSIS — Z8673 Personal history of transient ischemic attack (TIA), and cerebral infarction without residual deficits: Secondary | ICD-10-CM

## 2019-04-07 DIAGNOSIS — M549 Dorsalgia, unspecified: Secondary | ICD-10-CM | POA: Diagnosis present

## 2019-04-07 DIAGNOSIS — Z79899 Other long term (current) drug therapy: Secondary | ICD-10-CM

## 2019-04-07 DIAGNOSIS — G8929 Other chronic pain: Secondary | ICD-10-CM | POA: Diagnosis present

## 2019-04-07 DIAGNOSIS — R443 Hallucinations, unspecified: Secondary | ICD-10-CM | POA: Diagnosis present

## 2019-04-07 LAB — COMPREHENSIVE METABOLIC PANEL
ALT: 15 U/L (ref 0–44)
AST: 31 U/L (ref 15–41)
Albumin: 4.7 g/dL (ref 3.5–5.0)
Alkaline Phosphatase: 56 U/L (ref 38–126)
Anion gap: 14 (ref 5–15)
BUN: 47 mg/dL — ABNORMAL HIGH (ref 8–23)
CO2: 23 mmol/L (ref 22–32)
Calcium: 9.9 mg/dL (ref 8.9–10.3)
Chloride: 106 mmol/L (ref 98–111)
Creatinine, Ser: 2.34 mg/dL — ABNORMAL HIGH (ref 0.44–1.00)
GFR calc Af Amer: 21 mL/min — ABNORMAL LOW (ref >60)
GFR calc non Af Amer: 18 mL/min — ABNORMAL LOW (ref >60)
Glucose, Bld: 119 mg/dL — ABNORMAL HIGH (ref 70–99)
Potassium: 3.4 mmol/L — ABNORMAL LOW (ref 3.5–5.1)
Sodium: 143 mmol/L (ref 135–145)
Total Bilirubin: 1.4 mg/dL — ABNORMAL HIGH (ref 0.3–1.2)
Total Protein: 6.9 g/dL (ref 6.5–8.1)

## 2019-04-07 LAB — CBC WITH DIFFERENTIAL/PLATELET
Abs Immature Granulocytes: 0.04 10*3/uL (ref 0.00–0.07)
Basophils Absolute: 0 10*3/uL (ref 0.0–0.1)
Basophils Relative: 0 %
Eosinophils Absolute: 0.1 10*3/uL (ref 0.0–0.5)
Eosinophils Relative: 2 %
HCT: 41.2 % (ref 36.0–46.0)
Hemoglobin: 13.3 g/dL (ref 12.0–15.0)
Immature Granulocytes: 1 %
Lymphocytes Relative: 10 %
Lymphs Abs: 0.8 10*3/uL (ref 0.7–4.0)
MCH: 30.7 pg (ref 26.0–34.0)
MCHC: 32.3 g/dL (ref 30.0–36.0)
MCV: 95.2 fL (ref 80.0–100.0)
Monocytes Absolute: 0.7 10*3/uL (ref 0.1–1.0)
Monocytes Relative: 9 %
Neutro Abs: 6.5 10*3/uL (ref 1.7–7.7)
Neutrophils Relative %: 78 %
Platelets: 75 10*3/uL — ABNORMAL LOW (ref 150–400)
RBC: 4.33 MIL/uL (ref 3.87–5.11)
RDW: 14.9 % (ref 11.5–15.5)
WBC: 8.2 10*3/uL (ref 4.0–10.5)
nRBC: 0 % (ref 0.0–0.2)

## 2019-04-07 LAB — URINALYSIS, ROUTINE W REFLEX MICROSCOPIC
Bilirubin Urine: NEGATIVE
Glucose, UA: NEGATIVE mg/dL
Hgb urine dipstick: NEGATIVE
Ketones, ur: NEGATIVE mg/dL
Nitrite: NEGATIVE
Protein, ur: NEGATIVE mg/dL
Specific Gravity, Urine: 1.008 (ref 1.005–1.030)
pH: 6 (ref 5.0–8.0)

## 2019-04-07 LAB — SARS CORONAVIRUS 2 BY RT PCR (HOSPITAL ORDER, PERFORMED IN ~~LOC~~ HOSPITAL LAB): SARS Coronavirus 2: NEGATIVE

## 2019-04-07 MED ORDER — ATORVASTATIN CALCIUM 20 MG PO TABS
20.0000 mg | ORAL_TABLET | Freq: Every day | ORAL | Status: DC
  Administered 2019-04-08 – 2019-04-16 (×8): 20 mg via ORAL
  Filled 2019-04-07 (×3): qty 1
  Filled 2019-04-07: qty 2
  Filled 2019-04-07: qty 1
  Filled 2019-04-07: qty 2
  Filled 2019-04-07: qty 1
  Filled 2019-04-07: qty 2
  Filled 2019-04-07: qty 1
  Filled 2019-04-07: qty 2
  Filled 2019-04-07 (×3): qty 1
  Filled 2019-04-07 (×3): qty 2
  Filled 2019-04-07: qty 1

## 2019-04-07 MED ORDER — QUETIAPINE FUMARATE 25 MG PO TABS
50.0000 mg | ORAL_TABLET | Freq: Every day | ORAL | Status: DC
  Administered 2019-04-08 – 2019-04-16 (×10): 50 mg via ORAL
  Filled 2019-04-07 (×10): qty 2

## 2019-04-07 MED ORDER — POTASSIUM CHLORIDE 20 MEQ/15ML (10%) PO SOLN
40.0000 meq | Freq: Once | ORAL | Status: AC
  Administered 2019-04-08: 40 meq via ORAL
  Filled 2019-04-07: qty 30

## 2019-04-07 MED ORDER — VERAPAMIL HCL ER 120 MG PO TBCR
120.0000 mg | EXTENDED_RELEASE_TABLET | Freq: Every day | ORAL | Status: DC
  Administered 2019-04-08 – 2019-04-16 (×10): 120 mg via ORAL
  Filled 2019-04-07 (×11): qty 1

## 2019-04-07 MED ORDER — MOMETASONE FURO-FORMOTEROL FUM 100-5 MCG/ACT IN AERO
2.0000 | INHALATION_SPRAY | Freq: Two times a day (BID) | RESPIRATORY_TRACT | Status: DC
  Filled 2019-04-07: qty 8.8

## 2019-04-07 MED ORDER — FENTANYL CITRATE (PF) 100 MCG/2ML IJ SOLN
12.5000 ug | Freq: Once | INTRAMUSCULAR | Status: AC
  Administered 2019-04-07: 21:00:00 12.5 ug via INTRAVENOUS
  Filled 2019-04-07: qty 2

## 2019-04-07 MED ORDER — FENTANYL CITRATE (PF) 100 MCG/2ML IJ SOLN
50.0000 ug | Freq: Once | INTRAMUSCULAR | Status: AC
  Administered 2019-04-07: 50 ug via INTRAVENOUS
  Filled 2019-04-07: qty 2

## 2019-04-07 MED ORDER — ACETAMINOPHEN 650 MG RE SUPP: 650.0000 mg | Freq: Four times a day (QID) | RECTAL | Status: DC | PRN

## 2019-04-07 MED ORDER — PROMETHAZINE HCL 25 MG PO TABS
12.5000 mg | ORAL_TABLET | Freq: Three times a day (TID) | ORAL | Status: DC | PRN
  Administered 2019-04-15: 12.5 mg via ORAL
  Filled 2019-04-07: qty 1

## 2019-04-07 MED ORDER — ISOSORBIDE MONONITRATE ER 30 MG PO TB24
15.0000 mg | ORAL_TABLET | Freq: Every day | ORAL | Status: DC
  Administered 2019-04-09 – 2019-04-17 (×9): 15 mg via ORAL
  Filled 2019-04-07 (×10): qty 1

## 2019-04-07 MED ORDER — METOPROLOL TARTRATE 25 MG PO TABS
37.5000 mg | ORAL_TABLET | Freq: Two times a day (BID) | ORAL | Status: DC
  Administered 2019-04-08 – 2019-04-17 (×18): 37.5 mg via ORAL
  Filled 2019-04-07 (×20): qty 2

## 2019-04-07 MED ORDER — SODIUM CHLORIDE 0.9 % IV BOLUS
500.0000 mL | Freq: Once | INTRAVENOUS | Status: AC
  Administered 2019-04-07: 500 mL via INTRAVENOUS

## 2019-04-07 MED ORDER — SODIUM CHLORIDE 0.9 % IV SOLN
1.0000 g | Freq: Once | INTRAVENOUS | Status: AC
  Administered 2019-04-07: 1 g via INTRAVENOUS
  Filled 2019-04-07: qty 10

## 2019-04-07 MED ORDER — ALBUTEROL SULFATE (2.5 MG/3ML) 0.083% IN NEBU
3.0000 mL | INHALATION_SOLUTION | Freq: Four times a day (QID) | RESPIRATORY_TRACT | Status: DC | PRN
  Administered 2019-04-09 – 2019-04-12 (×4): 3 mL via RESPIRATORY_TRACT
  Filled 2019-04-07 (×6): qty 3

## 2019-04-07 MED ORDER — ACETAMINOPHEN 325 MG PO TABS
650.0000 mg | ORAL_TABLET | Freq: Four times a day (QID) | ORAL | Status: DC | PRN
  Administered 2019-04-08 – 2019-04-15 (×10): 650 mg via ORAL
  Filled 2019-04-07 (×11): qty 2

## 2019-04-07 MED ORDER — DOCUSATE SODIUM 100 MG PO CAPS
100.0000 mg | ORAL_CAPSULE | Freq: Two times a day (BID) | ORAL | Status: DC
  Administered 2019-04-08 – 2019-04-17 (×20): 100 mg via ORAL
  Filled 2019-04-07 (×20): qty 1

## 2019-04-07 MED ORDER — ONDANSETRON HCL 4 MG/2ML IJ SOLN
4.0000 mg | Freq: Once | INTRAMUSCULAR | Status: AC
  Administered 2019-04-07: 15:00:00 4 mg via INTRAVENOUS
  Filled 2019-04-07: qty 2

## 2019-04-07 MED ORDER — SODIUM CHLORIDE 0.9 % IV SOLN
INTRAVENOUS | Status: AC
  Administered 2019-04-07: via INTRAVENOUS

## 2019-04-07 NOTE — H&P (Signed)
History and Physical    [name] [id number] DOB: 01-18-1927 DOA: 04/07/2019  PCP: Colon Branch, MD  Patient coming from: Home  I have personally briefly reviewed patient's old medical records in [place]  Chief Complaint: Low back pain, confusion  HPI: [name] is a 85 y.o. female with medical history significant for atrial fibrillation and history of PE on Coumadin, hypertension, hyperlipidemia, history of CVA, CKD stage III, asthma, thrombocytopenia, history of colon cancer, and mild cognitive impairment who presents to the ED for evaluation of confusion and lower back pain.  Patient reports recent lower back pain and difficulty walking.  She has not had any trauma or falls.  Daughter reports some confusion as well and noticed that she had recent foul-smelling urine.  Patient reports decreased appetite.  Patient lives with daughter normally ambulates with the use of a walker but is having new difficulty even standing with her assistive device.   She denies any subjective fevers, chills, diaphoresis, chest pain, dyspnea, abdominal pain, dysuria.  She is on Coumadin and denies any obvious bleeding.  ED Course:  Initial vitals showed BP 111/55, pulse 70, RR 12, temp 98.3 Fahrenheit, SPO2 97% on room air.  Labs show sodium 143, potassium 3.4, BUN 47, creatinine 2.34 (BUN 26, creatinine 1.57 on 03/07/2019), WBC 8.2, hemoglobin 13.3, platelets 75,000 at baseline.  Urinalysis showed negative nitrites, large leukocytes, 0-5 RBCs, 6-10 WBCs, rare bacteria on microscopy.  SARS-CoV-2 test was ordered and pending.  Lumbar spine x-ray showed leftward scoliosis with advanced degenerative disc and facet disease diffusely without acute bony abnormality.  Bilateral hip x-ray was negative for acute abnormality.  Prior left total hip arthroplasty noted.  CT renal stone study was negative for acute abnormality.  Previous right hemicolectomy noted without evidence of bowel obstruction  or inflammation.  Moderate stool seen throughout the large bowel.  Punctate nonobstructing lower left renal stone noted without ureteral or bladder stones.  A hyperdense 1.8 cm renal cortical lesion of the left kidney was seen and appears stable since 2016 CT.  Patient was given IV normal saline 500 mLs and IV ceftriaxone.  The hospitalist service was consulted for further evaluation and management of acute kidney injury.   Review of Systems: All systems reviewed and are negative except as documented in history of present illness above.   Past Medical History:  Diagnosis Date  . Acute encephalopathy   . Acute renal failure superimposed on stage 3 chronic kidney disease ([place])   . Anemia   . Atrial fibrillation ([place])   . Colon cancer ([place]) 12/11  . Colon cancer ([place]) 2011  . Diarrhea   . DVT (deep venous thrombosis) ([place]) 11/06  . Elevated brain natriuretic peptide (BNP) level   . Frequent headaches   . Heart murmur   . Hyperkalemia   . Hypertension   . Pulmonary embolism ([name]) 11/06  . Seizures ([place])   . Stroke ([place]) 11/30/2014  . Urine retention 01/2019    Past Surgical History:  Procedure Laterality Date  . APPENDECTOMY  1952  . COLECTOMY  06/2010   partial, Dr. Donne Hazel complicated by LLE CVT; S/P IVC umbrella & anemia  . HIP FRACTURE SURGERY  2006   Trauma  . TOTAL ABDOMINAL HYSTERECTOMY W/ BILATERAL SALPINGOOPHORECTOMY  1973   For Fibroids  . TOTAL HIP ARTHROPLASTY  01/03/07   Left hip replacement.  Marland Kitchen VENA CAVA FILTER PLACEMENT  06/28/2010    Social History:  reports that she is a  non-smoker but has been exposed to tobacco smoke. She has never used smokeless tobacco. She reports that she does not drink alcohol or use drugs.  No Known Allergies  Family History  Problem Relation Age of Onset  . Peripheral vascular disease Father   . Heart attack Mother 3  . Heart disease Mother        MI  . Coronary artery disease Sister   . Diabetes Paternal Grandmother   .  Coronary artery disease Maternal Grandfather      Prior to Admission medications   Medication Sig Start Date End Date Taking? Authorizing Provider  acetaminophen (TYLENOL) 500 MG tablet Take 1,000 mg by mouth every 8 (eight) hours as needed for headache (pain).    Yes [provider]  albuterol (PROVENTIL) (2.5 MG/3ML) 0.083% nebulizer solution Take 2.5 mg by nebulization every 6 (six) hours as needed for wheezing or shortness of breath.   Yes [provider]  aspirin EC 81 MG tablet Take 81 mg by mouth daily.   Yes [provider]  atorvastatin (LIPITOR) 20 MG tablet Take 1 tablet (20 mg total) by mouth daily. 10/07/18  Yes Paz, [name], MD  calcium-vitamin D (OSCAL WITH D) 500-200 MG-UNIT tablet Take 1 tablet by mouth daily with breakfast. 07/21/18  Yes [name], MD  furosemide (LASIX) 80 MG tablet Take 1 tablet (80 mg total) by mouth daily. Patient taking differently: Take 40-80 mg by mouth See admin instructions. Takes 40 mg on Monday, Wednesday and Fridays, 80mg  all other days 11/21/18  Yes Burnell Blanks, MD  isosorbide mononitrate (IMDUR) 30 MG 24 hr tablet Take 0.5 tablets (15 mg total) by mouth daily. 08/22/18  Yes Paz, [name], MD  loratadine (CLARITIN) 10 MG tablet Take 1 tablet (10 mg total) by mouth daily as needed (seasonal allergies). 07/21/18  Yes [name], MD  metoprolol tartrate (LOPRESSOR) 25 MG tablet Take 1.5 tablets (37.5 mg total) by mouth 2 (two) times daily. 01/09/19  Yes Paz, [name], MD  mometasone-formoterol (DULERA) 100-5 MCG/ACT AERO Inhale 2 puffs into the lungs 2 (two) times daily. 02/24/19  Yes Paz, [name], MD  Multiple Vitamin (MULTIVITAMIN WITH MINERALS) TABS tablet Take 1 tablet by mouth daily. 06/06/18  Yes Mercy Riding, MD  PROAIR HFA 108 [phone] Base) MCG/ACT inhaler Inhale 2 puffs into the lungs every 6 (six) hours as needed for wheezing or shortness of breath. 12/23/18  Yes Paz, [name], MD  promethazine (PHENERGAN) 12.5  MG tablet Take 1 tablet (12.5 mg total) by mouth 3 (three) times daily as needed for nausea or vomiting. 07/21/18  Yes [name], MD  QUEtiapine (SEROQUEL) 25 MG tablet Take 2 tablets (50 mg total) by mouth at bedtime. 03/20/19  Yes Paz, [name], MD  tamsulosin (FLOMAX) 0.4 MG CAPS capsule Take 1 capsule (0.4 mg total) by mouth daily. 03/20/19  Yes Paz, [name], MD  traMADol (ULTRAM) 50 MG tablet Take 1 tablet (50 mg total) by mouth every 6 (six) hours as needed for moderate pain. 07/21/18  Yes [name], MD  verapamil (VERELAN PM) 120 MG 24 hr capsule Take 120 mg by mouth at bedtime.   Yes [provider]  warfarin (COUMADIN) 2.5 MG tablet Take 2.5 mg by mouth daily.  03/07/19  Yes Paz, [name], MD  nitroGLYCERIN (NITROSTAT) 0.4 MG SL tablet Place 1 tablet (0.4 mg total) under the tongue every 5 (five) minutes as needed for chest  pain. Patient not taking: Reported on 03/07/2019 07/21/18   [name], MD  polyethylene glycol [facility] / Floria Raveling) packet Take 17 g by mouth daily as needed. Patient not taking: Reported on 03/07/2019 07/21/18   [name], MD    Physical Exam: Vitals:   04/07/19 2100 04/07/19 2103 04/07/19 2105 04/07/19 2134  BP:  (!) 141/83  (!) 141/83  Pulse: 99 (!) 130 88 90  Resp:    18  Temp:    98.2 F (36.8 C)  TempSrc:    Oral  SpO2: 98% 98% 98% 97%  Weight:      Height:        Constitutional: Elderly woman resting supine in bed, NAD, calm, comfortable Eyes: PERRL, lids and conjunctivae normal ENMT: Mucous membranes are dry. Posterior pharynx clear of any exudate or lesions.poor dentition.  Neck: normal, supple, no masses. Respiratory: clear to auscultation bilaterally, no wheezing, no crackles. Normal respiratory effort. No accessory muscle use.  Cardiovascular: Irregularly irregular, no murmurs / rubs / gallops. No extremity edema.  Abdomen: no tenderness, no masses palpated. No hepatosplenomegaly. Bowel sounds positive.   Musculoskeletal: no clubbing / cyanosis. No joint deformity upper and lower extremities.  Range of motion of bilateral lower extremities limited with respect to hip flexion due to pain. Skin: Skin is dry, no rashes, lesions, ulcers. No induration Neurologic: CN 2-12 grossly intact. Sensation intact, Strength 5/5 in both upper extremities, limited with hip flexion of both lower extremities due to posterior buttocks pain Psychiatric: Normal judgment and insight. Alert and oriented x 3. Normal mood.   Labs on Admission: I have personally reviewed following labs and imaging studies  CBC: Recent Labs  Lab 04/07/19 1530  WBC 8.2  NEUTROABS 6.5  HGB 13.3  HCT 41.2  MCV 95.2  PLT 75*   Basic Metabolic Panel: Recent Labs  Lab 04/07/19 1530  NA 143  K 3.4*  CL 106  CO2 23  GLUCOSE 119*  BUN 47*  CREATININE 2.34*  CALCIUM 9.9   GFR: Estimated Creatinine Clearance: 15.7 mL/min (A) (by C-G formula based on SCr of 2.34 mg/dL (H)). Liver Function Tests: Recent Labs  Lab 04/07/19 1530  AST 31  ALT 15  ALKPHOS 56  BILITOT 1.4*  PROT 6.9  ALBUMIN 4.7   No results for input(s): LIPASE, AMYLASE in the last 168 hours. No results for input(s): AMMONIA in the last 168 hours. Coagulation Profile: No results for input(s): INR, PROTIME in the last 168 hours. Cardiac Enzymes: No results for input(s): CKTOTAL, CKMB, CKMBINDEX, TROPONINI in the last 168 hours. BNP (last 3 results) No results for input(s): PROBNP in the last 8760 hours. HbA1C: No results for input(s): HGBA1C in the last 72 hours. CBG: No results for input(s): GLUCAP in the last 168 hours. Lipid Profile: No results for input(s): CHOL, HDL, LDLCALC, TRIG, CHOLHDL, LDLDIRECT in the last 72 hours. Thyroid Function Tests: No results for input(s): TSH, T4TOTAL, FREET4, T3FREE, THYROIDAB in the last 72 hours. Anemia Panel: No results for input(s): VITAMINB12, FOLATE, FERRITIN, TIBC, IRON, RETICCTPCT in the last 72 hours.  Urine analysis:    Component Value Date/Time   COLORURINE YELLOW 04/07/2019 2050   APPEARANCEUR HAZY (A) 04/07/2019 2050   APPEARANCEUR Cloudy (A) 01/13/2018 1619   LABSPEC 1.008 04/07/2019 2050   PHURINE 6.0 04/07/2019 2050   GLUCOSEU NEGATIVE 04/07/2019 2050   GLUCOSEU NEGATIVE 09/28/2018 0939   HGBUR NEGATIVE 04/07/2019 2050   BILIRUBINUR NEGATIVE 04/07/2019 2050   BILIRUBINUR Negative 01/13/2018  [place] 04/07/2019 [place] 04/07/2019 2050   UROBILINOGEN 0.2 09/28/2018 0939   NITRITE NEGATIVE 04/07/2019 2050   [place] (A) 04/07/2019 2050    Radiological Exams on Admission: Dg Lumbar Spine Complete  Result Date: 04/07/2019 CLINICAL DATA:  Pain EXAM: LUMBAR SPINE - COMPLETE 4+ VIEW COMPARISON:  None. FINDINGS: Leftward scoliosis in the lumbar spine. Diffuse osteopenia. Diffuse degenerative disc and facet disease. No fracture. Aortic calcifications. No aneurysm. IVC filter remains in place, unchanged. IMPRESSION: Leftward scoliosis. Advanced degenerative disc and facet disease diffusely. No acute bony abnormality. Osteopenia. Electronically Signed   By: Rolm Baptise M.D.   On: 04/07/2019 13:36   Ct Renal Stone Study  Result Date: 04/07/2019 CLINICAL DATA:  Bilateral hip, back, flank pain for several days. EXAM: CT ABDOMEN AND PELVIS WITHOUT CONTRAST TECHNIQUE: Multidetector CT imaging of the abdomen and pelvis was performed following the standard protocol without IV contrast. COMPARISON:  08/06/2014 CT abdomen/pelvis. FINDINGS: Lower chest: Scattered small parenchymal bands in the dependent lung bases bilaterally, compatible with mild scarring or atelectasis. Cardiomegaly. Coronary atherosclerosis. Hepatobiliary: Normal liver size. No liver mass. Normal gallbladder with no radiopaque cholelithiasis. No biliary ductal dilatation. Pancreas: Normal, with no mass or duct dilation. Spleen: Normal size. No mass. Adrenals/Urinary Tract: Normal  adrenals. Punctate nonobstructing lower left renal stone. No additional renal stones. No hydronephrosis. Slightly hyperdense 1.8 cm renal cortical lesion in the anterior interpolar left kidney with density 53 HU (series 3/image 25), stable since 08/06/2014 CT. No additional contour deforming renal lesions. Bladder obscured by streak artifact from left hip hardware and nondistended. No gross bladder abnormality. Stomach/Bowel: Normal non-distended stomach. Apparent right hemicolectomy with ileocolic anastomosis in the midline abdomen. No small bowel dilatation or wall thickening. Moderate stool throughout the remnant large-bowel. No large bowel wall thickening, significant diverticulosis or acute pericolonic fat stranding. Vascular/Lymphatic: Atherosclerotic nonaneurysmal abdominal aorta. Infrarenal IVC filter is stable in position. No pathologically enlarged lymph nodes in the abdomen or pelvis. Reproductive: Status post hysterectomy, with no abnormal findings at the vaginal cuff. No adnexal mass. Other: No pneumoperitoneum, ascites or focal fluid collection. Musculoskeletal: No aggressive appearing focal osseous lesions. Marked thoracolumbar spondylosis. Left total hip arthroplasty. IMPRESSION: 1. No acute abnormality. Apparent right hemicolectomy. No evidence of bowel obstruction or acute bowel inflammation. Moderate stool throughout the remnant large-bowel, suggesting constipation. 2. Punctate nonobstructing lower left renal stone. No hydronephrosis. No ureteral or bladder stones. 3. Slightly hyperdense 1.8 cm renal cortical lesion in the anterior interpolar left kidney. While technically indeterminate, lesion is stable since 2016 CT, probably a benign hemorrhagic/proteinaceous renal cyst. 4. Aortic Atherosclerosis (ICD10-I70.0). Electronically Signed   By: Ilona Sorrel M.D.   On: 04/07/2019 19:50   Dg Hips Bilat W Or Wo Pelvis 2 Views  Result Date: 04/07/2019 CLINICAL DATA:  Bilateral hip pain without known  injury. EXAM: DG HIP (WITH OR WITHOUT PELVIS) 2V BILAT COMPARISON:  Radiographs of January 20, 2019. FINDINGS: Status post left total hip arthroplasty. No acute fracture or dislocation is noted. Right hip is unremarkable. IMPRESSION: No acute abnormality seen involving either hip. Status post left total hip arthroplasty. Electronically Signed   By: Marijo Conception M.D.   On: 04/07/2019 13:40    EKG: Independently reviewed.  Not performed.  Assessment/Plan Principal Problem:   Acute kidney injury superimposed on chronic kidney disease ([place]) Active Problems:   Essential hypertension   ATRIAL FIBRILLATION     Dyslipidemia   History of CVA (cerebrovascular accident)  Thrombocytopenia (HCC)   Asthma   Chronic diastolic (congestive) heart failure (HCC)  [name] is a 85 y.o. female with medical history significant for atrial fibrillation and history of PE on Coumadin, hypertension, hyperlipidemia, history of CVA, CKD stage III, asthma, thrombocytopenia, history of colon cancer, and mild cognitive impairment who is admitted with acute on chronic kidney injury.   Acute on chronic stage III kidney injury: Likely dehydrated in setting of poor oral intake and continue Lasix use. -Continue gentle IV hydration overnight and repeat labs in a.m.  Lower back pain/weakness: Unclear etiology, suspect deconditioning in setting of chronic degenerative disc disease.  X-rays negative for acute process.  PT/OT eval requested.  Encephalopathy: Daughter reports intermittent confusion and questionable UTI.  Urinalysis not convincing for UTI.  She is alert and oriented on admission.  She was given IV ceftriaxone in the ED.  Will hold further antibiotics for now.  Atrial fibrillation: CHA2DS2-VASc Score is 8.  She is on Coumadin and denies any obvious bleeding. -Continue Coumadin, monitor for signs/symptoms of bleeding in the setting of thrombocytopenia -Continue metoprolol and verapamil  Chronic diastolic  CHF: EF [id number] by TTE 01/21/2019.  Appears volume depleted on admission.  Holding Lasix as above and continue gentle IV fluids.  Monitor strict I/O's.  Thrombocytopenia: At baseline without obvious bleeding.  She is on Coumadin and aspirin, will need to monitor closely for further signs or symptoms of bleeding.  History of CVA: Continue aspirin as above and atorvastatin.  Hypokalemia: Replete and recheck in a.m.  Hypertension: Currently stable.  Continue metoprolol, verapamil, and Imdur.  Hyperlipidemia: Continue atorvastatin.  Asthma: Currently stable without active wheezing.  Continue Dulera and as needed albuterol.  Constipation: Moderate stool burden noted on CT.  Start Colace.  Mild cognitive impairment, chronic confusion/agitation/hallucinations: Recent intermittent confusion per daughter.  She has been on Seroquel for previous agitation/hallucinations which have been controlled well.  Will continue home Seroquel.  DVT prophylaxis: Coumadin Code Status: Full code, confirmed with patient and daughter Family Communication: Discussed with daughter [name] by phone [phone] Disposition Plan: Pending clinical progress Consults called: None Admission status: Observation   Zada Finders MD [facility]  If 7PM-7AM, please contact night-coverage [email or web address]  04/07/2019, 10:25 PM

## 2019-04-07 NOTE — Progress Notes (Signed)
ANTICOAGULATION CONSULT NOTE - Initial Consult  Pharmacy Consult for Warfarin Indication: atrial fibrillation  No Known Allergies  Patient Measurements: Height: 5\' 3"  (160 cm) Weight: 170 lb (77.1 kg) IBW/kg (Calculated) : 52.4   Vital Signs: Temp: 98.2 F (36.8 C) (10/02 2134) Temp Source: Oral (10/02 2134) BP: 159/79 (10/02 2231) Pulse Rate: 83 (10/02 2231)  Labs: Recent Labs    04/07/19 1530  HGB 13.3  HCT 41.2  PLT 75*  CREATININE 2.34*    Estimated Creatinine Clearance: 15.7 mL/min (A) (by C-G formula based on SCr of 2.34 mg/dL (H)).   Medical History: Past Medical History:  Diagnosis Date  . Acute encephalopathy   . Acute renal failure superimposed on stage 3 chronic kidney disease ([place])   . Anemia   . Atrial fibrillation ([place])   . Colon cancer ([place]) 12/11  . Colon cancer ([place]) 2011  . Diarrhea   . DVT (deep venous thrombosis) ([place]) 11/06  . Elevated brain natriuretic peptide (BNP) level   . Frequent headaches   . Heart murmur   . Hyperkalemia   . Hypertension   . Pulmonary embolism ([place]) 11/06  . Seizures ([place])   . Stroke ([place]) 11/30/2014  . Urine retention 01/2019    Medications:  Scheduled:  . [START ON 04/08/2019] atorvastatin  20 mg Oral Daily  . docusate sodium  100 mg Oral BID  . [START ON 04/08/2019] isosorbide mononitrate  15 mg Oral Daily  . metoprolol tartrate  37.5 mg Oral BID  . mometasone-formoterol  2 puff Inhalation BID  . potassium chloride  40 mEq Oral Once  . QUEtiapine  50 mg Oral QHS  . [START ON 04/08/2019] verapamil  120 mg Oral QHS   Infusions:  . sodium chloride      Assessment: 89 yoF admitted with low back pain, confusion on chronic warfarin for A-fib. HD 2.5 mg LD 10/1 2100.  INR = 3.6 , plts = 76 H/H WNL  Goal of Therapy:  INR 2-3    Plan:  INR stat Daily PT/INR  Lawana Pai R 04/07/2019,11:10 PM

## 2019-04-07 NOTE — ED Notes (Signed)
ua

## 2019-04-07 NOTE — ED Triage Notes (Signed)
Patient is from home and complains of bilateral hip and back pain for 3-4 days. She has found it harder to ambulate with her walker. Her daughter reports increased confusion. There is a concern for possible UTI  EMS vitals: 118/50 BP 142 CBG 97.7 temp 80 HR 95% O2 sat on room air

## 2019-04-07 NOTE — Progress Notes (Signed)
Patient's daughter would like to be contacted with all updates. Phone # [phone]

## 2019-04-07 NOTE — ED Provider Notes (Signed)
[facility] DEPT Provider Note   CSN: [id number] Arrival date & time: 04/07/19  1120     History   Chief Complaint Chief Complaint  Patient presents with   Altered Mental Status   Back Pain   Hip Pain    HPI [name] is a 85 y.o. female.     Pt presents to the ED today with back pain and AMS.  The pt said her back started hurting her without any trauma.  She is having a harder time walking with her walker.  Her daughter was also concerned pt may have an UTI.     Past Medical History:  Diagnosis Date   Acute encephalopathy    Acute renal failure superimposed on stage 3 chronic kidney disease (HCC)    Anemia    Atrial fibrillation ([place])    Colon cancer ([place]) 12/11   Colon cancer ([place]) 2011   Diarrhea    DVT (deep venous thrombosis) ([place]) 11/06   Elevated brain natriuretic peptide (BNP) level    Frequent headaches    Heart murmur    Hyperkalemia    Hypertension    Pulmonary embolism ([place]) 11/06   Seizures ([place])    Stroke ([place]) 11/30/2014   Urine retention 01/2019    Patient Active Problem List   Diagnosis Date Noted   Acute kidney injury ([place]) 04/07/2019   MCI (mild cognitive impairment) 03/07/2019   Congestive heart failure (CHF) ([place]) 01/20/2019   E. coli UTI 07/21/2018   Acute kidney injury superimposed on chronic kidney disease ([place]) 07/21/2018   CAD (coronary artery disease) 06/07/2018   Chronic diastolic (congestive) heart failure ([place]) 06/07/2018   Pulmonary hypertension, primary ([place]) 06/07/2018   Generalized weakness    Abnormal LFTs 06/01/2018   Hallucination 06/01/2018   Abnormal chest x-ray 09/28/2015   Edema of right lower extremity 09/28/2015   Elevated brain natriuretic peptide (BNP) level 09/17/2015   Seizures ([place]) 09/16/2015   Asthma 08/14/2015   PCP NOTES >>>>>>>>>>>>>>>>>>>>>>>>>>>>>> 04/24/2015   TIA (transient ischemic attack) 12/11/2014   Dyslipidemia  12/11/2014   History of CVA (cerebrovascular accident) 12/01/2014   Thrombocytopenia ([place]) 12/01/2014   Chronic renal insufficiency, stage II (mild) 11/12/2013   Impingement syndrome, shoulder, left 11/09/2013   Acquired short bowel syndrome 11/09/2013   Annual physical exam 08/30/2013   Hx pulmonary embolism 06/27/2013   Warfarin anticoagulation 06/27/2013   DDD (degenerative disc disease), lumbar 11/18/2012   Mitral regurgitation 08/26/2011   DYSPHAGIA UNSPECIFIED 07/31/2010   ATRIAL FIBRILLATION   07/08/2010   COLON CANCER, HX OF 07/08/2010   DIZZINESS 10/18/2009   Essential hypertension 12/16/2007   HIP PAIN, CHRONIC 12/28/2006   ANEMIA-NOS 07/15/2006   Personal history of venous thrombosis and embolism 07/15/2006    Past Surgical History:  Procedure Laterality Date   APPENDECTOMY  1952   COLECTOMY  06/2010   partial, Dr. Donne Hazel complicated by LLE CVT; S/P IVC umbrella & anemia   HIP FRACTURE SURGERY  2006   Trauma   TOTAL ABDOMINAL HYSTERECTOMY W/ BILATERAL SALPINGOOPHORECTOMY  1973   For Fibroids   TOTAL HIP ARTHROPLASTY  01/03/07   Left hip replacement.   VENA CAVA FILTER PLACEMENT  06/28/2010     OB History   No obstetric history on file.      Home Medications    Prior to Admission medications   Medication Sig Start Date End Date Taking? Authorizing Provider  acetaminophen (TYLENOL) 500 MG tablet Take 1,000 mg by mouth every 8 (  eight) hours as needed for headache (pain).    Yes [provider]  albuterol (PROVENTIL) (2.5 MG/3ML) 0.083% nebulizer solution Take 2.5 mg by nebulization every 6 (six) hours as needed for wheezing or shortness of breath.   Yes [provider]  aspirin EC 81 MG tablet Take 81 mg by mouth daily.   Yes [provider]  atorvastatin (LIPITOR) 20 MG tablet Take 1 tablet (20 mg total) by mouth daily. 10/07/18  Yes Paz, [name], MD  calcium-vitamin D (OSCAL WITH D) 500-200 MG-UNIT tablet  Take 1 tablet by mouth daily with breakfast. 07/21/18  Yes [name], MD  furosemide (LASIX) 80 MG tablet Take 1 tablet (80 mg total) by mouth daily. Patient taking differently: Take 40-80 mg by mouth See admin instructions. Takes 40 mg on Monday, Wednesday and Fridays, 80mg  all other days 11/21/18  Yes Burnell Blanks, MD  isosorbide mononitrate (IMDUR) 30 MG 24 hr tablet Take 0.5 tablets (15 mg total) by mouth daily. 08/22/18  Yes Paz, [name], MD  loratadine (CLARITIN) 10 MG tablet Take 1 tablet (10 mg total) by mouth daily as needed (seasonal allergies). 07/21/18  Yes [name], MD  metoprolol tartrate (LOPRESSOR) 25 MG tablet Take 1.5 tablets (37.5 mg total) by mouth 2 (two) times daily. 01/09/19  Yes Paz, [name], MD  mometasone-formoterol (DULERA) 100-5 MCG/ACT AERO Inhale 2 puffs into the lungs 2 (two) times daily. 02/24/19  Yes Paz, [name], MD  Multiple Vitamin (MULTIVITAMIN WITH MINERALS) TABS tablet Take 1 tablet by mouth daily. 06/06/18  Yes Mercy Riding, MD  PROAIR HFA 108 [phone] Base) MCG/ACT inhaler Inhale 2 puffs into the lungs every 6 (six) hours as needed for wheezing or shortness of breath. 12/23/18  Yes Paz, [name], MD  promethazine (PHENERGAN) 12.5 MG tablet Take 1 tablet (12.5 mg total) by mouth 3 (three) times daily as needed for nausea or vomiting. 07/21/18  Yes [name], MD  QUEtiapine (SEROQUEL) 25 MG tablet Take 2 tablets (50 mg total) by mouth at bedtime. 03/20/19  Yes Paz, [name], MD  tamsulosin (FLOMAX) 0.4 MG CAPS capsule Take 1 capsule (0.4 mg total) by mouth daily. 03/20/19  Yes Paz, [name], MD  traMADol (ULTRAM) 50 MG tablet Take 1 tablet (50 mg total) by mouth every 6 (six) hours as needed for moderate pain. 07/21/18  Yes [name], MD  verapamil (VERELAN PM) 120 MG 24 hr capsule Take 120 mg by mouth at bedtime.   Yes [provider]  warfarin (COUMADIN) 2.5 MG tablet Take 2.5 mg by mouth daily.  03/07/19  Yes Paz, [name], MD    nitroGLYCERIN (NITROSTAT) 0.4 MG SL tablet Place 1 tablet (0.4 mg total) under the tongue every 5 (five) minutes as needed for chest pain. Patient not taking: Reported on 03/07/2019 07/21/18   [name], MD  polyethylene glycol [facility] / Floria Raveling) packet Take 17 g by mouth daily as needed. Patient not taking: Reported on 03/07/2019 07/21/18   [name], MD    Family History Family History  Problem Relation Age of Onset   Peripheral vascular disease Father    Heart attack Mother 34   Heart disease Mother        MI   Coronary artery disease Sister    Diabetes Paternal Grandmother    Coronary artery disease Maternal Grandfather     Social History Social History   Tobacco Use   Smoking  status: Passive Smoke Exposure - Never Smoker   Smokeless tobacco: Never Used   Tobacco comment: husband smoked in home.   Substance Use Topics   Alcohol use: No    Alcohol/week: 0.0 standard drinks   Drug use: No     Allergies   Patient has no known allergies.   Review of Systems Review of Systems  Musculoskeletal: Positive for back pain.  All other systems reviewed and are negative.    Physical Exam Updated Vital Signs BP (!) 141/83 (BP Location: Left Arm)    Pulse 90    Temp 98.2 F (36.8 C) (Oral)    Resp 18    Ht 5\' 3"  (1.6 m)    Wt 77.1 kg    SpO2 97%    BMI 30.11 kg/m   Physical Exam Vitals signs and nursing note reviewed.  Constitutional:      Appearance: Normal appearance.  HENT:     Head: Normocephalic and atraumatic.     Right Ear: External ear normal.     Left Ear: External ear normal.     Nose: Nose normal.     Mouth/Throat:     Mouth: Mucous membranes are dry.  Eyes:     Extraocular Movements: Extraocular movements intact.     Conjunctiva/sclera: Conjunctivae normal.     Pupils: Pupils are equal, round, and reactive to light.  Neck:     Musculoskeletal: Normal range of motion and neck supple.  Cardiovascular:     Rate and Rhythm:  Normal rate and regular rhythm.     Pulses: Normal pulses.     Heart sounds: Normal heart sounds.  Pulmonary:     Effort: Pulmonary effort is normal.     Breath sounds: Normal breath sounds.  Abdominal:     General: Abdomen is flat. Bowel sounds are normal.     Palpations: Abdomen is soft.  Musculoskeletal:       Arms:  Skin:    General: Skin is warm.     Capillary Refill: Capillary refill takes less than 2 seconds.  Neurological:     General: No focal deficit present.     Mental Status: She is alert and oriented to person, place, and time.  Psychiatric:        Mood and Affect: Mood normal.        Behavior: Behavior normal.        Thought Content: Thought content normal.        Judgment: Judgment normal.      ED Treatments / Results  Labs (all labs ordered are listed, but only abnormal results are displayed) Labs Reviewed  COMPREHENSIVE METABOLIC PANEL - Abnormal; Notable for the following components:      Result Value   Potassium 3.4 (*)    Glucose, Bld 119 (*)    BUN 47 (*)    Creatinine, Ser 2.34 (*)    Total Bilirubin 1.4 (*)    GFR calc non Af Amer 18 (*)    GFR calc Af Amer 21 (*)    All other components within normal limits  CBC WITH DIFFERENTIAL/PLATELET - Abnormal; Notable for the following components:   Platelets 75 (*)    All other components within normal limits  URINALYSIS, ROUTINE W REFLEX MICROSCOPIC - Abnormal; Notable for the following components:   APPearance HAZY (*)    Leukocytes,Ua LARGE (*)    Bacteria, UA RARE (*)    All other components within normal limits  URINE CULTURE  SARS CORONAVIRUS  2 (HOSPITAL ORDER, [facility])    EKG None  Radiology Dg Lumbar Spine Complete  Result Date: 04/07/2019 CLINICAL DATA:  Pain EXAM: LUMBAR SPINE - COMPLETE 4+ VIEW COMPARISON:  None. FINDINGS: Leftward scoliosis in the lumbar spine. Diffuse osteopenia. Diffuse degenerative disc and facet disease. No fracture. Aortic  calcifications. No aneurysm. IVC filter remains in place, unchanged. IMPRESSION: Leftward scoliosis. Advanced degenerative disc and facet disease diffusely. No acute bony abnormality. Osteopenia. Electronically Signed   By: Rolm Baptise M.D.   On: 04/07/2019 13:36   Ct Renal Stone Study  Result Date: 04/07/2019 CLINICAL DATA:  Bilateral hip, back, flank pain for several days. EXAM: CT ABDOMEN AND PELVIS WITHOUT CONTRAST TECHNIQUE: Multidetector CT imaging of the abdomen and pelvis was performed following the standard protocol without IV contrast. COMPARISON:  08/06/2014 CT abdomen/pelvis. FINDINGS: Lower chest: Scattered small parenchymal bands in the dependent lung bases bilaterally, compatible with mild scarring or atelectasis. Cardiomegaly. Coronary atherosclerosis. Hepatobiliary: Normal liver size. No liver mass. Normal gallbladder with no radiopaque cholelithiasis. No biliary ductal dilatation. Pancreas: Normal, with no mass or duct dilation. Spleen: Normal size. No mass. Adrenals/Urinary Tract: Normal adrenals. Punctate nonobstructing lower left renal stone. No additional renal stones. No hydronephrosis. Slightly hyperdense 1.8 cm renal cortical lesion in the anterior interpolar left kidney with density 53 HU (series 3/image 25), stable since 08/06/2014 CT. No additional contour deforming renal lesions. Bladder obscured by streak artifact from left hip hardware and nondistended. No gross bladder abnormality. Stomach/Bowel: Normal non-distended stomach. Apparent right hemicolectomy with ileocolic anastomosis in the midline abdomen. No small bowel dilatation or wall thickening. Moderate stool throughout the remnant large-bowel. No large bowel wall thickening, significant diverticulosis or acute pericolonic fat stranding. Vascular/Lymphatic: Atherosclerotic nonaneurysmal abdominal aorta. Infrarenal IVC filter is stable in position. No pathologically enlarged lymph nodes in the abdomen or pelvis. Reproductive:  Status post hysterectomy, with no abnormal findings at the vaginal cuff. No adnexal mass. Other: No pneumoperitoneum, ascites or focal fluid collection. Musculoskeletal: No aggressive appearing focal osseous lesions. Marked thoracolumbar spondylosis. Left total hip arthroplasty. IMPRESSION: 1. No acute abnormality. Apparent right hemicolectomy. No evidence of bowel obstruction or acute bowel inflammation. Moderate stool throughout the remnant large-bowel, suggesting constipation. 2. Punctate nonobstructing lower left renal stone. No hydronephrosis. No ureteral or bladder stones. 3. Slightly hyperdense 1.8 cm renal cortical lesion in the anterior interpolar left kidney. While technically indeterminate, lesion is stable since 2016 CT, probably a benign hemorrhagic/proteinaceous renal cyst. 4. Aortic Atherosclerosis (ICD10-I70.0). Electronically Signed   By: Ilona Sorrel M.D.   On: 04/07/2019 19:50   Dg Hips Bilat W Or Wo Pelvis 2 Views  Result Date: 04/07/2019 CLINICAL DATA:  Bilateral hip pain without known injury. EXAM: DG HIP (WITH OR WITHOUT PELVIS) 2V BILAT COMPARISON:  Radiographs of January 20, 2019. FINDINGS: Status post left total hip arthroplasty. No acute fracture or dislocation is noted. Right hip is unremarkable. IMPRESSION: No acute abnormality seen involving either hip. Status post left total hip arthroplasty. Electronically Signed   By: Marijo Conception M.D.   On: 04/07/2019 13:40    Procedures Procedures (including critical care time)  Medications Ordered in ED Medications  cefTRIAXone (ROCEPHIN) 1 g in sodium chloride 0.9 % 100 mL IVPB (1 g Intravenous New Bag/Given 04/07/19 2227)  fentaNYL (SUBLIMAZE) injection 50 mcg (50 mcg Intravenous Given 04/07/19 1522)  ondansetron (ZOFRAN) injection 4 mg (4 mg Intravenous Given 04/07/19 1522)  sodium chloride 0.9 % bolus 500 mL (0 mLs  Intravenous Stopped 04/07/19 2228)  fentaNYL (SUBLIMAZE) injection 12.5 mcg (12.5 mcg Intravenous Given 04/07/19 2102)       Initial Impression / Assessment and Plan / ED Course  I have reviewed the triage vital signs and the nursing notes.  Pertinent labs & imaging results that were available during my care of the patient were reviewed by me and considered in my medical decision making (see chart for details).    Pt given IV pain meds for her back pain.  Pt is still unable to get up  She does have a mild UTI and is treated with rocephin IV.  She is d/w her daughter for admission.   Final Clinical Impressions(s) / ED Diagnoses   Final diagnoses:  AKI (acute kidney injury) ([place])  Acute cystitis without hematuria  Ambulatory dysfunction  Acute bilateral low back pain without sciatica    ED Discharge Orders    None       Isla Pence, MD 04/07/19 2237

## 2019-04-08 DIAGNOSIS — K5900 Constipation, unspecified: Secondary | ICD-10-CM | POA: Diagnosis present

## 2019-04-08 DIAGNOSIS — M549 Dorsalgia, unspecified: Secondary | ICD-10-CM | POA: Diagnosis present

## 2019-04-08 DIAGNOSIS — M545 Low back pain: Secondary | ICD-10-CM

## 2019-04-08 DIAGNOSIS — I4821 Permanent atrial fibrillation: Secondary | ICD-10-CM | POA: Diagnosis present

## 2019-04-08 DIAGNOSIS — M6281 Muscle weakness (generalized): Secondary | ICD-10-CM | POA: Diagnosis not present

## 2019-04-08 DIAGNOSIS — I4891 Unspecified atrial fibrillation: Secondary | ICD-10-CM | POA: Diagnosis not present

## 2019-04-08 DIAGNOSIS — Z9181 History of falling: Secondary | ICD-10-CM | POA: Diagnosis not present

## 2019-04-08 DIAGNOSIS — I10 Essential (primary) hypertension: Secondary | ICD-10-CM | POA: Diagnosis not present

## 2019-04-08 DIAGNOSIS — N189 Chronic kidney disease, unspecified: Secondary | ICD-10-CM | POA: Diagnosis not present

## 2019-04-08 DIAGNOSIS — N3090 Cystitis, unspecified without hematuria: Secondary | ICD-10-CM | POA: Diagnosis not present

## 2019-04-08 DIAGNOSIS — Z8673 Personal history of transient ischemic attack (TIA), and cerebral infarction without residual deficits: Secondary | ICD-10-CM

## 2019-04-08 DIAGNOSIS — I130 Hypertensive heart and chronic kidney disease with heart failure and stage 1 through stage 4 chronic kidney disease, or unspecified chronic kidney disease: Secondary | ICD-10-CM | POA: Diagnosis present

## 2019-04-08 DIAGNOSIS — M5136 Other intervertebral disc degeneration, lumbar region: Secondary | ICD-10-CM | POA: Diagnosis present

## 2019-04-08 DIAGNOSIS — N3000 Acute cystitis without hematuria: Secondary | ICD-10-CM | POA: Diagnosis present

## 2019-04-08 DIAGNOSIS — J45909 Unspecified asthma, uncomplicated: Secondary | ICD-10-CM | POA: Diagnosis present

## 2019-04-08 DIAGNOSIS — D649 Anemia, unspecified: Secondary | ICD-10-CM | POA: Diagnosis not present

## 2019-04-08 DIAGNOSIS — I5030 Unspecified diastolic (congestive) heart failure: Secondary | ICD-10-CM | POA: Diagnosis not present

## 2019-04-08 DIAGNOSIS — R443 Hallucinations, unspecified: Secondary | ICD-10-CM | POA: Diagnosis present

## 2019-04-08 DIAGNOSIS — E860 Dehydration: Secondary | ICD-10-CM | POA: Diagnosis present

## 2019-04-08 DIAGNOSIS — I129 Hypertensive chronic kidney disease with stage 1 through stage 4 chronic kidney disease, or unspecified chronic kidney disease: Secondary | ICD-10-CM | POA: Diagnosis not present

## 2019-04-08 DIAGNOSIS — I4811 Longstanding persistent atrial fibrillation: Secondary | ICD-10-CM

## 2019-04-08 DIAGNOSIS — M2550 Pain in unspecified joint: Secondary | ICD-10-CM | POA: Diagnosis not present

## 2019-04-08 DIAGNOSIS — N200 Calculus of kidney: Secondary | ICD-10-CM | POA: Diagnosis present

## 2019-04-08 DIAGNOSIS — I2510 Atherosclerotic heart disease of native coronary artery without angina pectoris: Secondary | ICD-10-CM | POA: Diagnosis present

## 2019-04-08 DIAGNOSIS — R05 Cough: Secondary | ICD-10-CM | POA: Diagnosis not present

## 2019-04-08 DIAGNOSIS — E876 Hypokalemia: Secondary | ICD-10-CM | POA: Diagnosis present

## 2019-04-08 DIAGNOSIS — I270 Primary pulmonary hypertension: Secondary | ICD-10-CM | POA: Diagnosis present

## 2019-04-08 DIAGNOSIS — D696 Thrombocytopenia, unspecified: Secondary | ICD-10-CM | POA: Diagnosis present

## 2019-04-08 DIAGNOSIS — I69328 Other speech and language deficits following cerebral infarction: Secondary | ICD-10-CM | POA: Diagnosis not present

## 2019-04-08 DIAGNOSIS — G8929 Other chronic pain: Secondary | ICD-10-CM | POA: Diagnosis present

## 2019-04-08 DIAGNOSIS — R0602 Shortness of breath: Secondary | ICD-10-CM | POA: Diagnosis not present

## 2019-04-08 DIAGNOSIS — B9689 Other specified bacterial agents as the cause of diseases classified elsewhere: Secondary | ICD-10-CM | POA: Diagnosis not present

## 2019-04-08 DIAGNOSIS — R1312 Dysphagia, oropharyngeal phase: Secondary | ICD-10-CM | POA: Diagnosis not present

## 2019-04-08 DIAGNOSIS — R569 Unspecified convulsions: Secondary | ICD-10-CM | POA: Diagnosis not present

## 2019-04-08 DIAGNOSIS — R262 Difficulty in walking, not elsewhere classified: Secondary | ICD-10-CM | POA: Diagnosis present

## 2019-04-08 DIAGNOSIS — E870 Hyperosmolality and hypernatremia: Secondary | ICD-10-CM | POA: Diagnosis present

## 2019-04-08 DIAGNOSIS — R410 Disorientation, unspecified: Secondary | ICD-10-CM | POA: Diagnosis not present

## 2019-04-08 DIAGNOSIS — R278 Other lack of coordination: Secondary | ICD-10-CM | POA: Diagnosis not present

## 2019-04-08 DIAGNOSIS — Z20828 Contact with and (suspected) exposure to other viral communicable diseases: Secondary | ICD-10-CM | POA: Diagnosis present

## 2019-04-08 DIAGNOSIS — E785 Hyperlipidemia, unspecified: Secondary | ICD-10-CM | POA: Diagnosis present

## 2019-04-08 DIAGNOSIS — I5032 Chronic diastolic (congestive) heart failure: Secondary | ICD-10-CM | POA: Diagnosis present

## 2019-04-08 DIAGNOSIS — B961 Klebsiella pneumoniae [K. pneumoniae] as the cause of diseases classified elsewhere: Secondary | ICD-10-CM | POA: Diagnosis not present

## 2019-04-08 DIAGNOSIS — G9341 Metabolic encephalopathy: Secondary | ICD-10-CM | POA: Diagnosis present

## 2019-04-08 DIAGNOSIS — R2681 Unsteadiness on feet: Secondary | ICD-10-CM | POA: Diagnosis not present

## 2019-04-08 DIAGNOSIS — E43 Unspecified severe protein-calorie malnutrition: Secondary | ICD-10-CM | POA: Diagnosis not present

## 2019-04-08 DIAGNOSIS — N390 Urinary tract infection, site not specified: Secondary | ICD-10-CM | POA: Diagnosis not present

## 2019-04-08 DIAGNOSIS — I25118 Atherosclerotic heart disease of native coronary artery with other forms of angina pectoris: Secondary | ICD-10-CM | POA: Diagnosis not present

## 2019-04-08 DIAGNOSIS — I69311 Memory deficit following cerebral infarction: Secondary | ICD-10-CM | POA: Diagnosis not present

## 2019-04-08 DIAGNOSIS — R0902 Hypoxemia: Secondary | ICD-10-CM | POA: Diagnosis not present

## 2019-04-08 DIAGNOSIS — N179 Acute kidney failure, unspecified: Secondary | ICD-10-CM | POA: Diagnosis present

## 2019-04-08 DIAGNOSIS — R41841 Cognitive communication deficit: Secondary | ICD-10-CM | POA: Diagnosis not present

## 2019-04-08 DIAGNOSIS — I482 Chronic atrial fibrillation, unspecified: Secondary | ICD-10-CM | POA: Diagnosis not present

## 2019-04-08 DIAGNOSIS — Z7401 Bed confinement status: Secondary | ICD-10-CM | POA: Diagnosis not present

## 2019-04-08 DIAGNOSIS — M25559 Pain in unspecified hip: Secondary | ICD-10-CM | POA: Diagnosis present

## 2019-04-08 DIAGNOSIS — G3184 Mild cognitive impairment, so stated: Secondary | ICD-10-CM | POA: Diagnosis present

## 2019-04-08 DIAGNOSIS — N183 Chronic kidney disease, stage 3 unspecified: Secondary | ICD-10-CM | POA: Diagnosis present

## 2019-04-08 LAB — CBC
HCT: 40 % (ref 36.0–46.0)
Hemoglobin: 12.7 g/dL (ref 12.0–15.0)
MCH: 31 pg (ref 26.0–34.0)
MCHC: 31.8 g/dL (ref 30.0–36.0)
MCV: 97.6 fL (ref 80.0–100.0)
Platelets: 76 10*3/uL — ABNORMAL LOW (ref 150–400)
RBC: 4.1 MIL/uL (ref 3.87–5.11)
RDW: 14.8 % (ref 11.5–15.5)
WBC: 6.9 10*3/uL (ref 4.0–10.5)
nRBC: 0 % (ref 0.0–0.2)

## 2019-04-08 LAB — BASIC METABOLIC PANEL
Anion gap: 10 (ref 5–15)
BUN: 41 mg/dL — ABNORMAL HIGH (ref 8–23)
CO2: 27 mmol/L (ref 22–32)
Calcium: 9.2 mg/dL (ref 8.9–10.3)
Chloride: 107 mmol/L (ref 98–111)
Creatinine, Ser: 1.96 mg/dL — ABNORMAL HIGH (ref 0.44–1.00)
GFR calc Af Amer: 25 mL/min — ABNORMAL LOW (ref >60)
GFR calc non Af Amer: 22 mL/min — ABNORMAL LOW (ref >60)
Glucose, Bld: 114 mg/dL — ABNORMAL HIGH (ref 70–99)
Potassium: 3.5 mmol/L (ref 3.5–5.1)
Sodium: 144 mmol/L (ref 135–145)

## 2019-04-08 LAB — PROTIME-INR
INR: 3.6 — ABNORMAL HIGH (ref 0.8–1.2)
Prothrombin Time: 35.5 seconds — ABNORMAL HIGH (ref 11.4–15.2)

## 2019-04-08 LAB — MAGNESIUM: Magnesium: 2.2 mg/dL (ref 1.7–2.4)

## 2019-04-08 LAB — PHOSPHORUS: Phosphorus: 4 mg/dL (ref 2.5–4.6)

## 2019-04-08 MED ORDER — OXYCODONE HCL 5 MG PO TABS
5.0000 mg | ORAL_TABLET | Freq: Four times a day (QID) | ORAL | Status: DC | PRN
  Administered 2019-04-08 – 2019-04-12 (×7): 5 mg via ORAL
  Filled 2019-04-08 (×7): qty 1

## 2019-04-08 MED ORDER — ENSURE ENLIVE PO LIQD
237.0000 mL | Freq: Two times a day (BID) | ORAL | Status: DC
  Administered 2019-04-09 – 2019-04-17 (×13): 237 mL via ORAL

## 2019-04-08 MED ORDER — SODIUM CHLORIDE 0.9 % IV BOLUS
500.0000 mL | Freq: Once | INTRAVENOUS | Status: AC
  Administered 2019-04-08: 500 mL via INTRAVENOUS

## 2019-04-08 MED ORDER — POLYETHYLENE GLYCOL 3350 17 G PO PACK
17.0000 g | PACK | Freq: Every day | ORAL | Status: DC
  Administered 2019-04-08 – 2019-04-17 (×8): 17 g via ORAL
  Filled 2019-04-08 (×9): qty 1

## 2019-04-08 MED ORDER — MOMETASONE FURO-FORMOTEROL FUM 100-5 MCG/ACT IN AERO
2.0000 | INHALATION_SPRAY | Freq: Two times a day (BID) | RESPIRATORY_TRACT | Status: DC
  Administered 2019-04-08 – 2019-04-17 (×19): 2 via RESPIRATORY_TRACT

## 2019-04-08 NOTE — Evaluation (Signed)
Occupational Therapy Evaluation Patient Details Name: [name] MRN: [id number] DOB: 1926/12/28 Today's Date: 04/08/2019    History of Present Illness [name] is a 85 y.o. female with medical history significant for atrial fibrillation and history of PE on Coumadin, hypertension, hyperlipidemia, history of CVA, CKD stage III, asthma, thrombocytopenia, history of colon cancer, and mild cognitive impairment who presented to the ED for evaluation of confusion and lower back pain.  Family reports increased difficulty with ambulation.   Clinical Impression   Pt was admitted for the above.  At baseline, she lives with her daughter, and daughter assists with adls and mobility as needed.  Pt reported pain was much better despite not having taken stronger pain medication.  RN reports she was sleeping a lot. Pt was able to tolerate rolling to L with min A, but she could not progress to sitting EOB due to increased pain.  Will follow in acute setting with the goals listed below.  We will call to premedicate with stronger pain meds next time    Follow Up Recommendations  SNF;Supervision/Assistance - 24 hour(depending upon progress)    Equipment Recommendations  3 in 1 bedside commode(if tolerated)    Recommendations for Other Services       Precautions / Restrictions Precautions Precautions: Fall Restrictions Weight Bearing Restrictions: No      Mobility Bed Mobility     Rolling: Min assist         General bed mobility comments: to L using chux and bedrail with 2/10 pain. Could not progress to EOB as pain jumped to 9/10 when legs were lowered off bed with support  Transfers                 General transfer comment: Unable to assess due to pain.    Balance                                           ADL either performed or assessed with clinical judgement   ADL Overall ADL's : Needs assistance/impaired Eating/Feeding: Independent   Grooming: Set  up   Upper Body Bathing: Minimal assistance   Lower Body Bathing: Total assistance;Bed level   Upper Body Dressing : Minimal assistance   Lower Body Dressing: Total assistance;Sit to/from stand                 General ADL Comments: ADLs from bed level in hospital bed with rails.  pt felt like pain was controlled and stronger medication was not given per RN.  Pt tolerated rolling to L well, but could not tolerate sitting up.       Vision         Perception     Praxis      Pertinent Vitals/Pain Pain Assessment: 0-10 Pain Score: 9  Pain Location: low back when transitioning from sidelying with feet coming off bed Pain Descriptors / Indicators: Guarding;Grimacing;Moaning Pain Intervention(s): Limited activity within patient's tolerance;Monitored during session;Premedicated before session;Repositioned     Hand Dominance     Extremity/Trunk Assessment Upper Extremity Assessment Upper Extremity Assessment: Generalized weakness           Communication Communication Communication: HOH   Cognition Arousal/Alertness: Awake/alert Behavior During Therapy: WFL for tasks assessed/performed Overall Cognitive Status: Within Functional Limits for tasks assessed    pt has mild cognitive impairment per chart  General Comments       Exercises     Shoulder Instructions      Home Living Family/patient expects to be discharged to:: Unsure Living Arrangements: Children Available Help at Discharge: Family               Bathroom Shower/Tub: Walk-in shower   Bathroom Toilet: Handicapped height     Home Equipment: Clinical cytogeneticist - 2 wheels;Toilet riser          Prior Functioning/Environment          Comments: states that her daughter helps her as needed; and amount of assistance varies        OT Problem List: Decreased strength;Decreased activity tolerance;Pain;Decreased knowledge of use of DME or  AE;Decreased knowledge of precautions(balance NT)      OT Treatment/Interventions: Self-care/ADL training;Therapeutic exercise;DME and/or AE instruction;Therapeutic activities;Visual/perceptual remediation/compensation;Patient/family education(balance prn)    OT Goals(Current goals can be found in the care plan section) Acute Rehab OT Goals Patient Stated Goal: get rid of pain OT Goal Formulation: With patient Time For Goal Achievement: 04/22/19 Potential to Achieve Goals: Good ADL Goals Pt Will Transfer to Toilet: with mod assist;stand pivot transfer;bedside commode Additional ADL Goal #1: pt will tolerate sitting for UB adls with set up Additional ADL Goal #2: pt will go from sit to stand with mod A for adls Additional ADL Goal #3: pt will perform bed mobility with mod A in preparation for toilet transfers  OT Frequency: Min 2X/week   Barriers to D/C:            Co-evaluation              AM-PAC OT "6 Clicks" Daily Activity     Outcome Measure Help from another person eating meals?: None Help from another person taking care of personal grooming?: A Little Help from another person toileting, which includes using toliet, bedpan, or urinal?: Total Help from another person bathing (including washing, rinsing, drying)?: A Lot Help from another person to put on and taking off regular upper body clothing?: A Lot Help from another person to put on and taking off regular lower body clothing?: Total 6 Click Score: 13   End of Session    Activity Tolerance: Patient limited by pain Patient left: in bed;with call bell/phone within reach;with bed alarm set  OT Visit Diagnosis: Muscle weakness (generalized) (M62.81)                Time: [id number] OT Time Calculation (min): 15 min Charges:  OT General Charges $OT Visit: 1 Visit OT Evaluation $OT Eval Low Complexity: [place], OTR/L Acute Rehabilitation Services [phone] [facility] pager [phone]  office 04/08/2019  Bennetta Rudden 04/08/2019, 2:16 PM

## 2019-04-08 NOTE — Evaluation (Signed)
Physical Therapy Evaluation Patient Details Name: [name] MRN: [id number] DOB: May 10, 1927 Today's Date: 04/08/2019   History of Present Illness  [name] is a 85 y.o. female with medical history significant for atrial fibrillation and history of PE on Coumadin, hypertension, hyperlipidemia, history of CVA, CKD stage III, asthma, thrombocytopenia, history of colon cancer, and mild cognitive impairment who presented to the ED for evaluation of confusion and lower back pain.  Family reports increased difficulty with ambulation.  Clinical Impression  Pt admitted with above diagnosis. Pt currently with functional limitations due to the deficits listed below (see PT Problem List). Pt will benefit from skilled PT to increase their independence and safety with mobility to allow discharge to the venue listed below.  Pt's mobility limited by pain at eval and only tolerated rolling which produced 10/10 pain. She reports pain in low back and hips R worse than L. MD in at end of session and is going to change pain meds.  OT to see pt later today and will be letting PT know how pt did on new pain meds.  If pt's pain gets better controlled and she can transfer and ambulate short distances, then may be ok to go home with family with PTAR transport home (due to stairs), if she does not progress with mobility and family cannot care for her, then SNF will need to be considered.     Follow Up Recommendations Home health PT;SNF;Supervision/Assistance - 24 hour    Equipment Recommendations  None recommended by PT    Recommendations for Other Services       Precautions / Restrictions Precautions Precautions: Fall Restrictions Weight Bearing Restrictions: No      Mobility  Bed Mobility Overal bed mobility: Needs Assistance Bed Mobility: Rolling Rolling: Mod assist         General bed mobility comments: Rolled with use of bed pad and rail with MOD A and then yelled out in pain and had to return  to supine. Pt states it is 10/10 pain with movement and declined further movement.  Transfers                 General transfer comment: Unable to assess due to pain.  Ambulation/Gait                Stairs            Wheelchair Mobility    Modified Rankin (Stroke Patients Only)       Balance                                             Pertinent Vitals/Pain Pain Assessment: 0-10 Pain Score: 10-Worst pain ever Pain Location: low back and hips (R > L) Pain Descriptors / Indicators: Grimacing;Moaning;Guarding(grabbing) Pain Intervention(s): Limited activity within patient's tolerance;Monitored during session;Premedicated before session;Repositioned    Home Living Family/patient expects to be discharged to:: Private residence Living Arrangements: Children Available Help at Discharge: Family Type of Home: House Home Access: Stairs to enter Entrance Stairs-Rails: Right Entrance Stairs-Number of Steps: 6-17(last admission said 17, pt today said 6) Home Layout: Able to live on main level with bedroom/bathroom;Two level Home Equipment: Clinical cytogeneticist - 2 wheels;Toilet riser Additional Comments: info from pt and from last admission note    Prior Function Level of Independence: Needs assistance   Gait / Transfers Assistance Needed: Amb with  RW           Hand Dominance        Extremity/Trunk Assessment   Upper Extremity Assessment Upper Extremity Assessment: Defer to OT evaluation    Lower Extremity Assessment Lower Extremity Assessment: Generalized weakness;LLE deficits/detail;RLE deficits/detail RLE Deficits / Details: Limited knee/hip flexion by 50% due to pain. Could not tolerate hip abduction RLE: Unable to fully assess due to pain LLE Deficits / Details: Limited knee/hip flexion by 25% due to pain.  Was able to tolerate PROM of L hip into abduction. LLE: Unable to fully assess due to pain       Communication       Cognition Arousal/Alertness: Awake/alert Behavior During Therapy: WFL for tasks assessed/performed Overall Cognitive Status: Within Functional Limits for tasks assessed                                 General Comments: Pt overall seems oriented. She did have difficulty remembering how many steps to enter the house.      General Comments General comments (skin integrity, edema, etc.): MD came in at end of session and informed of pt's pain with mobility.  He stated he will change pain meds and see how she does on those.    Exercises Low Level/ICU Exercises Ankle Circles/Pumps: AROM;Both;10 reps Hip ABduction/ADduction: PROM;Left;10 reps Heel Slides: AROM;Both;5 reps   Assessment/Plan    PT Assessment Patient needs continued PT services  PT Problem List Decreased strength;Decreased activity tolerance;Decreased balance;Decreased mobility;Pain;Decreased coordination       PT Treatment Interventions DME instruction;Gait training;Functional mobility training;Stair training;Therapeutic activities;Therapeutic exercise;Balance training;Neuromuscular re-education    PT Goals (Current goals can be found in the Care Plan section)  Acute Rehab PT Goals Patient Stated Goal: get rid of pain PT Goal Formulation: With patient Time For Goal Achievement: 04/22/19 Potential to Achieve Goals: Fair    Frequency Min 3X/week   Barriers to discharge        Co-evaluation               AM-PAC PT "6 Clicks" Mobility  Outcome Measure Help needed turning from your back to your side while in a flat bed without using bedrails?: Total Help needed moving from lying on your back to sitting on the side of a flat bed without using bedrails?: Total Help needed moving to and from a bed to a chair (including a wheelchair)?: Total Help needed standing up from a chair using your arms (e.g., wheelchair or bedside chair)?: Total Help needed to walk in hospital room?: Total Help needed  climbing 3-5 steps with a railing? : Total 6 Click Score: 6    End of Session Equipment Utilized During Treatment: Oxygen Activity Tolerance: Patient limited by pain Patient left: in bed;with call bell/phone within reach;with bed alarm set Nurse Communication: Mobility status PT Visit Diagnosis: Other abnormalities of gait and mobility (R26.89);Pain Pain - Right/Left: Right Pain - part of body: Hip    Time: [id number] PT Time Calculation (min) (ACUTE ONLY): 19 min   Charges:   PT Evaluation $PT Eval Low Complexity: 1 Low          Kahne Helfand L. Tamala Julian, [place] Pager [phone] 04/08/2019   Galen Manila 04/08/2019, 9:45 AM

## 2019-04-08 NOTE — Progress Notes (Signed)
[facility]  PROGRESS NOTE  TYRE YANTA [id number] DOB: 03-18-1927 DOA: 04/07/2019 PCP: Colon Branch, MD   Brief HPI:   85 year old female with history of atrial fibrillation, pulmonary embolism on Coumadin, hypertension, hyperlipidemia, history of CVA, CKD stage III, asthma, thrombocytopenia, history of colon cancer, mild cognitive impairment who came to the ED for evaluation of confusion and lower back pain.  Patient reported lower back pain difficulty walking.  Denied trauma or falls.  There is also question of confusion with foul-smelling urine.    Subjective   Patient seen and examined, still complains of low back pain.  Unable to participate with physical therapy due to low back pain.   Assessment/Plan:     1. Acute kidney injury on CKD stage III-likely in setting of dehydration, poor p.o. intake and Lasix use.  Started on IV normal saline.  Creatinine has improved from 2.34-1.96 today.  Baseline creatinine around 1.3-1.4 as of July 2020.   2. Low back pain-patient has chronic degenerative disc disease.  X-ray of the lumbar spine showed no acute process.  PT OT has been consulted.  Will start oxycodone 5 mg every 6 hours PRN.  Continue Tylenol PRN for pain.  3. Encephalopathy-resolved, patient has history of intermittent confusion.  UA was not convincing for UTI.  Back to baseline.  Will monitor.  Continue home Seroquel.  4. Permanent atrial fibrillation-EKG showed atrial fibrillation, heart rate is controlled.CHA2DS2VASc score is 8.  Patient is on anticoagulation with Coumadin.  Continue metoprolol, verapamil for rate control.  5. Chronic diastolic CHF-EF 60 to [id number] by TTE on 01/21/2019.  Currently appears volume depleted.  Lasix is on hold.  Was started on gentle IV fluids.  IV fluids have been discontinued..  Will follow BMP in a.m.  6. History of CVA-continue aspirin, atorvastatin.  7. Hypertension-blood pressure stable, continue metoprolol,  verapamil.  8. Constipation-moderate stool burden noted on CT scan.  Started on Colace.  Will start MiraLAX 17 g daily.    CBC: Recent Labs  Lab 04/07/19 1530 04/08/19 0021  WBC 8.2 6.9  NEUTROABS 6.5  --   HGB 13.3 12.7  HCT 41.2 40.0  MCV 95.2 97.6  PLT 75* 76*    Basic Metabolic Panel: Recent Labs  Lab 04/07/19 1530 04/08/19 0021  NA 143 144  K 3.4* 3.5  CL 106 107  CO2 23 27  GLUCOSE 119* 114*  BUN 47* 41*  CREATININE 2.34* 1.96*  CALCIUM 9.9 9.2  MG  --  2.2  PHOS  --  4.0     DVT prophylaxis: Coumadin  Code Status: Full code  Family Communication: No family at bedside  Disposition Plan: likely home when medically ready for discharge Pressure Injury 04/08/19 Buttocks (Active)  04/08/19 0821  Location: Buttocks  Location Orientation:   Staging:   Wound Description (Comments):   Present on Admission:      BMI  Estimated body mass index is 30.73 kg/m as calculated from the following:   Height as of this encounter: 5\' 3"  (1.6 m).   Weight as of this encounter: 78.7 kg.  Scheduled medications:  . atorvastatin  20 mg Oral q1800  . docusate sodium  100 mg Oral BID  . feeding supplement (ENSURE ENLIVE)  237 mL Oral BID BM  . isosorbide mononitrate  15 mg Oral Daily  . metoprolol tartrate  37.5 mg Oral BID  . mometasone-formoterol  2 puff Inhalation BID  . QUEtiapine  50 mg Oral QHS  . verapamil  120 mg Oral QHS    Consultants:    Procedures:     Antibiotics:   Anti-infectives (From admission, onward)   Start     Dose/Rate Route Frequency Ordered Stop   04/07/19 2130  cefTRIAXone (ROCEPHIN) 1 g in sodium chloride 0.9 % 100 mL IVPB     1 g 200 mL/hr over 30 Minutes Intravenous  Once 04/07/19 2126 04/08/19 0638       Objective   Vitals:   04/08/19 0740 04/08/19 0908 04/08/19 0908 04/08/19 1130  BP:  134/60  (!) 111/43  Pulse:  (!) 58 61 (!) 58  Resp:  (!) 22  20  Temp:   98.2 F (36.8 C) 98.2 F (36.8 C)  TempSrc:   Oral  Oral  SpO2: 95% 93% 95% 99%  Weight:      Height:        Intake/Output Summary (Last 24 hours) at 04/08/2019 1416 Last data filed at 04/08/2019 [id number] Gross per 24 hour  Intake 1302.89 ml  Output 650 ml  Net 652.89 ml   Filed Weights   04/07/19 1159 04/07/19 2340 04/08/19 0530  Weight: 77.1 kg 78.7 kg 78.7 kg     Physical Examination:    General: Appears in no acute distress  Cardiovascular: S1-S2, regular  Respiratory: Clear to auscultation bilaterally  Abdomen: Abdomen is soft, nontender, no organomegaly  Extremities: No edema in the lower extremities  Neurologic: Alert, oriented x3, moving all extremities     Data Reviewed: I have personally reviewed following labs and imaging studies   Recent Results (from the past 240 hour(s))  SARS Coronavirus 2 [facility] order, Performed in [facility] hospital lab) Nasopharyngeal Nasopharyngeal Swab     Status: None   Collection Time: 04/07/19 10:30 PM   Specimen: Nasopharyngeal Swab  Result Value Ref Range Status   SARS Coronavirus 2 NEGATIVE NEGATIVE Final    Comment: (NOTE) If result is NEGATIVE SARS-CoV-2 target nucleic acids are NOT DETECTED. The SARS-CoV-2 RNA is generally detectable in upper and lower  respiratory specimens during the acute phase of infection. The lowest  concentration of SARS-CoV-2 viral copies this assay can detect is 250  copies / mL. A negative result does not preclude SARS-CoV-2 infection  and should not be used as the sole basis for treatment or other  patient management decisions.  A negative result may occur with  improper specimen collection / handling, submission of specimen other  than nasopharyngeal swab, presence of viral mutation(s) within the  areas targeted by this assay, and inadequate number of viral copies  (<250 copies / mL). A negative result must be combined with clinical  observations, patient history, and epidemiological information. If result is POSITIVE SARS-CoV-2  target nucleic acids are DETECTED. The SARS-CoV-2 RNA is generally detectable in upper and lower  respiratory specimens dur ing the acute phase of infection.  Positive  results are indicative of active infection with SARS-CoV-2.  Clinical  correlation with patient history and other diagnostic information is  necessary to determine patient infection status.  Positive results do  not rule out bacterial infection or co-infection with other viruses. If result is PRESUMPTIVE POSTIVE SARS-CoV-2 nucleic acids MAY BE PRESENT.   A presumptive positive result was obtained on the submitted specimen  and confirmed on repeat testing.  While 2019 novel coronavirus  (SARS-CoV-2) nucleic acids may be present in the submitted sample  additional confirmatory testing may be necessary for epidemiological  and / or clinical management purposes  to differentiate  between  SARS-CoV-2 and other Sarbecovirus currently known to infect humans.  If clinically indicated additional testing with an alternate test  methodology [phone]) is advised. The SARS-CoV-2 RNA is generally  detectable in upper and lower respiratory sp ecimens during the acute  phase of infection. The expected result is Negative. Fact Sheet for Patients:  StrictlyIdeas.no Fact Sheet for Healthcare Providers: BankingDealers.co.za This test is not yet approved or cleared by the [place] FDA and has been authorized for detection and/or diagnosis of SARS-CoV-2 by FDA under an Emergency Use Authorization (EUA).  This EUA will remain in effect (meaning this test can be used) for the duration of the COVID-19 declaration under Section 564(b)(1) of the Act, 21 U.S.C. section 360bbb-3(b)(1), unless the authorization is terminated or revoked sooner. Performed at [facility], [place] [address] [place], [place] [address]      Liver Function Tests: Recent Labs  Lab 04/07/19 1530   AST 31  ALT 15  ALKPHOS 56  BILITOT 1.4*  PROT 6.9  ALBUMIN 4.7   BNP (last 3 results) Recent Labs    01/20/19 0941  BNP 852.0*       Studies: Dg Lumbar Spine Complete  Result Date: 04/07/2019 CLINICAL DATA:  Pain EXAM: LUMBAR SPINE - COMPLETE 4+ VIEW COMPARISON:  None. FINDINGS: Leftward scoliosis in the lumbar spine. Diffuse osteopenia. Diffuse degenerative disc and facet disease. No fracture. Aortic calcifications. No aneurysm. IVC filter remains in place, unchanged. IMPRESSION: Leftward scoliosis. Advanced degenerative disc and facet disease diffusely. No acute bony abnormality. Osteopenia. Electronically Signed   By: Rolm Baptise M.D.   On: 04/07/2019 13:36   Ct Renal Stone Study  Result Date: 04/07/2019 CLINICAL DATA:  Bilateral hip, back, flank pain for several days. EXAM: CT ABDOMEN AND PELVIS WITHOUT CONTRAST TECHNIQUE: Multidetector CT imaging of the abdomen and pelvis was performed following the standard protocol without IV contrast. COMPARISON:  08/06/2014 CT abdomen/pelvis. FINDINGS: Lower chest: Scattered small parenchymal bands in the dependent lung bases bilaterally, compatible with mild scarring or atelectasis. Cardiomegaly. Coronary atherosclerosis. Hepatobiliary: Normal liver size. No liver mass. Normal gallbladder with no radiopaque cholelithiasis. No biliary ductal dilatation. Pancreas: Normal, with no mass or duct dilation. Spleen: Normal size. No mass. Adrenals/Urinary Tract: Normal adrenals. Punctate nonobstructing lower left renal stone. No additional renal stones. No hydronephrosis. Slightly hyperdense 1.8 cm renal cortical lesion in the anterior interpolar left kidney with density 53 HU (series 3/image 25), stable since 08/06/2014 CT. No additional contour deforming renal lesions. Bladder obscured by streak artifact from left hip hardware and nondistended. No gross bladder abnormality. Stomach/Bowel: Normal non-distended stomach. Apparent right hemicolectomy with  ileocolic anastomosis in the midline abdomen. No small bowel dilatation or wall thickening. Moderate stool throughout the remnant large-bowel. No large bowel wall thickening, significant diverticulosis or acute pericolonic fat stranding. Vascular/Lymphatic: Atherosclerotic nonaneurysmal abdominal aorta. Infrarenal IVC filter is stable in position. No pathologically enlarged lymph nodes in the abdomen or pelvis. Reproductive: Status post hysterectomy, with no abnormal findings at the vaginal cuff. No adnexal mass. Other: No pneumoperitoneum, ascites or focal fluid collection. Musculoskeletal: No aggressive appearing focal osseous lesions. Marked thoracolumbar spondylosis. Left total hip arthroplasty. IMPRESSION: 1. No acute abnormality. Apparent right hemicolectomy. No evidence of bowel obstruction or acute bowel inflammation. Moderate stool throughout the remnant large-bowel, suggesting constipation. 2. Punctate nonobstructing lower left renal stone. No hydronephrosis. No ureteral or bladder stones. 3. Slightly hyperdense 1.8 cm renal cortical lesion in the anterior interpolar left kidney. While technically indeterminate, lesion  is stable since 2016 CT, probably a benign hemorrhagic/proteinaceous renal cyst. 4. Aortic Atherosclerosis (ICD10-I70.0). Electronically Signed   By: Ilona Sorrel M.D.   On: 04/07/2019 19:50   Dg Hips Bilat W Or Wo Pelvis 2 Views  Result Date: 04/07/2019 CLINICAL DATA:  Bilateral hip pain without known injury. EXAM: DG HIP (WITH OR WITHOUT PELVIS) 2V BILAT COMPARISON:  Radiographs of January 20, 2019. FINDINGS: Status post left total hip arthroplasty. No acute fracture or dislocation is noted. Right hip is unremarkable. IMPRESSION: No acute abnormality seen involving either hip. Status post left total hip arthroplasty. Electronically Signed   By: Marijo Conception M.D.   On: 04/07/2019 13:40     Admission status: Inpatient: Based on patients clinical presentation and evaluation of above  clinical data, I have made determination that patient meets Inpatient criteria at this time.  Time spent: 20 min  [facility] Pager [phone]. If 7PM-7AM, please contact night-coverage at [email or web address], Office  [phone]  password [email or web address]  04/08/2019, 2:16 PM  LOS: 0 days

## 2019-04-08 NOTE — Progress Notes (Signed)
ANTICOAGULATION CONSULT NOTE - Follow Up Consult  Pharmacy Consult for warfarin Indication: hx atrial fibrillation  No Known Allergies  Patient Measurements: Height: 5\' 3"  (160 cm) Weight: 173 lb 8 oz (78.7 kg) IBW/kg (Calculated) : 52.4 Heparin Dosing Weight:   Vital Signs: Temp: 98.2 F (36.8 C) (10/03 0908) Temp Source: Oral (10/03 0908) BP: 134/60 (10/03 0908) Pulse Rate: 61 (10/03 0908)  Labs: Recent Labs    04/07/19 1530 04/08/19 0021  HGB 13.3 12.7  HCT 41.2 40.0  PLT 75* 76*  LABPROT  --  35.5*  INR  --  3.6*  CREATININE 2.34* 1.96*    Estimated Creatinine Clearance: 18.9 mL/min (A) (by C-G formula based on SCr of 1.96 mg/dL (H)).   Medications:  - PTA warfarin regimen: 2.5 mg daily (last dose taken on 10/1)  Assessment: Patient is a 85 y.o F with hx afib on warfarin PTA, presented to the ED on 10/2 with c/o AMS, back and hip pain.  Warfarin resumed on admission.  Today, 04/08/2019: - INR is supra- therapeutic at 3.6 - hgb stable; plts low but stable at 76 (was 75 in July 2020) - no bleeding documented - no significant drug-drug intxns   Goal of Therapy:  INR 2-3 Monitor platelets by anticoagulation protocol: Yes   Plan:  - hold warfarin today - f/u with INR on 10/4 and order dose if needed - monitor for s/s bleeding   Annistyn Depass P 04/08/2019,11:11 AM

## 2019-04-08 NOTE — Progress Notes (Signed)
Pt BP 90/54, HR 66 and RR 13. On-call provider paged. Awaiting further instruction.

## 2019-04-08 NOTE — Progress Notes (Signed)
Patient oxygen level 86% on room air. Oxygen applied via nasal cannula @ 2 liters

## 2019-04-09 LAB — BASIC METABOLIC PANEL
Anion gap: 7 (ref 5–15)
BUN: 36 mg/dL — ABNORMAL HIGH (ref 8–23)
CO2: 27 mmol/L (ref 22–32)
Calcium: 8.8 mg/dL — ABNORMAL LOW (ref 8.9–10.3)
Chloride: 113 mmol/L — ABNORMAL HIGH (ref 98–111)
Creatinine, Ser: 1.62 mg/dL — ABNORMAL HIGH (ref 0.44–1.00)
GFR calc Af Amer: 32 mL/min — ABNORMAL LOW (ref >60)
GFR calc non Af Amer: 28 mL/min — ABNORMAL LOW (ref >60)
Glucose, Bld: 99 mg/dL (ref 70–99)
Potassium: 3.9 mmol/L (ref 3.5–5.1)
Sodium: 147 mmol/L — ABNORMAL HIGH (ref 135–145)

## 2019-04-09 LAB — PROTIME-INR
INR: 3.8 — ABNORMAL HIGH (ref 0.8–1.2)
Prothrombin Time: 36.5 seconds — ABNORMAL HIGH (ref 11.4–15.2)

## 2019-04-09 MED ORDER — SODIUM CHLORIDE 0.9 % IV SOLN
INTRAVENOUS | Status: AC
  Administered 2019-04-09: 14:00:00 via INTRAVENOUS

## 2019-04-09 MED ORDER — SODIUM CHLORIDE 0.9 % IV SOLN
1.0000 g | INTRAVENOUS | Status: DC
  Administered 2019-04-09 – 2019-04-12 (×4): 1 g via INTRAVENOUS
  Filled 2019-04-09: qty 1
  Filled 2019-04-09 (×2): qty 10
  Filled 2019-04-09 (×2): qty 1

## 2019-04-09 NOTE — Progress Notes (Signed)
ANTICOAGULATION CONSULT NOTE - Follow Up Consult  Pharmacy Consult for warfarin Indication: hx atrial fibrillation  No Known Allergies  Patient Measurements: Height: 5\' 3"  (160 cm) Weight: 173 lb 8 oz (78.7 kg) IBW/kg (Calculated) : 52.4 Heparin Dosing Weight:   Vital Signs: Temp: 97.8 F (36.6 C) (10/04 0612) Temp Source: Oral (10/04 0612) BP: 128/58 (10/04 1028) Pulse Rate: 50 (10/04 1028)  Labs: Recent Labs    04/07/19 1530 04/08/19 0021 04/09/19 0456 04/09/19 0542  HGB 13.3 12.7  --   --   HCT 41.2 40.0  --   --   PLT 75* 76*  --   --   LABPROT  --  35.5*  --  36.5*  INR  --  3.6*  --  3.8*  CREATININE 2.34* 1.96* 1.62*  --     Estimated Creatinine Clearance: 22.9 mL/min (A) (by C-G formula based on SCr of 1.62 mg/dL (H)).   Medications:  - PTA warfarin regimen: 2.5 mg daily (last dose taken on 10/1)  Assessment: Patient is a 85 y.o F with hx afib on warfarin PTA, presented to the ED on 10/2 with c/o AMS, back and hip pain.  Warfarin resumed on admission.  Today, 04/09/2019: - INR is supra- therapeutic this morning. Increased from 3.6 to 3.8 wit dose held on 10/3 - hgb stable; plts low but stable at 76 (was 75 in July 2020)  - no bleeding documented - no significant drug-drug intxns   Goal of Therapy:  INR 2-3 Monitor platelets by anticoagulation protocol: Yes   Plan:  - continue to hold warfarin today - f/u with INR on 10/5 and order dose if needed - monitor for s/s bleeding   Deleah Tison P 04/09/2019,10:37 AM

## 2019-04-09 NOTE — Progress Notes (Signed)
[facility]  PROGRESS NOTE  [name] [id number] DOB: [birth year] DOA: 04/07/2019 PCP: Colon Branch, MD   Brief HPI:   85 year old female with history of atrial fibrillation, pulmonary embolism on Coumadin, hypertension, hyperlipidemia, history of CVA, CKD stage III, asthma, thrombocytopenia, history of colon cancer, mild cognitive impairment who came to the ED for evaluation of confusion and lower back pain.  Patient reported lower back pain difficulty walking.  Denied trauma or falls.  There is also question of confusion with foul-smelling urine.    Subjective   Patient seen and examined, urine culture growing greater than 100,000 colonies of gram-negative rods.  Back pain has improved.   Assessment/Plan:     1. Acute kidney injury on CKD stage III-likely in setting of dehydration, poor p.o. intake and Lasix use.  Started on IV normal saline.  Creatinine has improved from 2.34-1.62 today.  Baseline creatinine around 1.3-1.4 as of July 2020.   2. UTI-urine culture growing greater than 100,000 colonies of gram-negative rods.  Will start ceftriaxone 1 g IV daily.  Follow urine culture results.  3. Low back pain-patient has chronic degenerative disc disease.  X-ray of the lumbar spine showed no acute process.  PT OT has been consulted.  Patient was started on oxycodone 5 mg every 6 hours PRN.  Continue Tylenol PRN for pain.  4. Encephalopathy-resolved, patient has history of intermittent confusion.  UA was not convincing for UTI.  Back to baseline.  Will monitor.  Continue home Seroquel.  5. Permanent atrial fibrillation-EKG showed atrial fibrillation, heart rate is controlled.CHA2DS2VASc score is 8.  Patient is on anticoagulation with Coumadin.  Continue metoprolol, verapamil for rate control.  6. Chronic diastolic CHF-EF 60 to [id number] by TTE on 01/21/2019.  Currently appears volume depleted.  Lasix is on hold.  Continue gentle IV fluids normal saline.  7. History of CVA-continue  aspirin, atorvastatin.  8. Hypertension-blood pressure stable, continue metoprolol, verapamil.  9. Constipation-moderate stool burden noted on CT scan.  Started on Colace.  Will start MiraLAX 17 g daily.    CBC: Recent Labs  Lab 04/07/19 1530 04/08/19 0021  WBC 8.2 6.9  NEUTROABS 6.5  --   HGB 13.3 12.7  HCT 41.2 40.0  MCV 95.2 97.6  PLT 75* 76*    Basic Metabolic Panel: Recent Labs  Lab 04/07/19 1530 04/08/19 0021 04/09/19 0456  NA 143 144 147*  K 3.4* 3.5 3.9  CL 106 107 113*  CO2 23 27 27   GLUCOSE 119* 114* 99  BUN 47* 41* 36*  CREATININE 2.34* 1.96* 1.62*  CALCIUM 9.9 9.2 8.8*  MG  --  2.2  --   PHOS  --  4.0  --      DVT prophylaxis: Coumadin  Code Status: Full code  Family Communication: No family at bedside  Disposition Plan: likely home when medically ready for discharge Pressure Injury 04/08/19 Buttocks (Active)  04/08/19 0821  Location: Buttocks  Location Orientation:   Staging:   Wound Description (Comments):   Present on Admission:      BMI  Estimated body mass index is 30.73 kg/m as calculated from the following:   Height as of this encounter: 5\' 3"  (1.6 m).   Weight as of this encounter: 78.7 kg.  Scheduled medications:  . atorvastatin  20 mg Oral q1800  . docusate sodium  100 mg Oral BID  . feeding supplement (ENSURE ENLIVE)  237 mL Oral BID BM  . isosorbide mononitrate  15 mg Oral Daily  .  metoprolol tartrate  37.5 mg Oral BID  . mometasone-formoterol  2 puff Inhalation BID  . polyethylene glycol  17 g Oral Daily  . QUEtiapine  50 mg Oral QHS  . verapamil  120 mg Oral QHS    Consultants:    Procedures:     Antibiotics:   Anti-infectives (From admission, onward)   Start     Dose/Rate Route Frequency Ordered Stop   04/09/19 1200  cefTRIAXone (ROCEPHIN) 1 g in sodium chloride 0.9 % 100 mL IVPB     1 g 200 mL/hr over 30 Minutes Intravenous Every 24 hours 04/09/19 1053     04/07/19 2130  cefTRIAXone (ROCEPHIN) 1 g  in sodium chloride 0.9 % 100 mL IVPB     1 g 200 mL/hr over 30 Minutes Intravenous  Once 04/07/19 2126 04/08/19 0638       Objective   Vitals:   04/09/19 0004 04/09/19 0612 04/09/19 0825 04/09/19 1028  BP: (!) 147/94 (!) 152/64  (!) 128/58  Pulse: 77 68  (!) 50  Resp: 19 19    Temp:  97.8 F (36.6 C)    TempSrc:  Oral    SpO2: 95% 95% 94%   Weight:      Height:        Intake/Output Summary (Last 24 hours) at 04/09/2019 1332 Last data filed at 04/09/2019 [id number] Gross per 24 hour  Intake 360 ml  Output 400 ml  Net -40 ml   Filed Weights   04/07/19 1159 04/07/19 2340 04/08/19 0530  Weight: 77.1 kg 78.7 kg 78.7 kg     Physical Examination:  General-appears in no acute distress Heart-S1-S2, regular, no murmur auscultated Lungs-clear to auscultation bilaterally, no wheezing or crackles auscultated Abdomen-soft, nontender, no organomegaly Extremities-no edema in the lower extremities Neuro-alert, oriented x3, no focal deficit noted    Data Reviewed: I have personally reviewed following labs and imaging studies   Recent Results (from the past 240 hour(s))  Urine culture     Status: Abnormal (Preliminary result)   Collection Time: 04/07/19 12:12 PM   Specimen: Urine, Random  Result Value Ref Range Status   Specimen Description   Final    URINE, RANDOM Performed at [facility] [place] [address], [place], [place] [address]    Special Requests   Final    NONE Performed at [facility], [place] [address], [place], [place] [address]    Culture >=100,000 COLONIES/mL GRAM NEGATIVE RODS (A)  Final   Report Status PENDING  Incomplete  SARS Coronavirus 2 [facility] order, Performed in [facility] hospital lab) Nasopharyngeal Nasopharyngeal Swab     Status: None   Collection Time: 04/07/19 10:30 PM   Specimen: Nasopharyngeal Swab  Result Value Ref Range Status   SARS Coronavirus 2 NEGATIVE NEGATIVE Final    Comment: (NOTE) If result is  NEGATIVE SARS-CoV-2 target nucleic acids are NOT DETECTED. The SARS-CoV-2 RNA is generally detectable in upper and lower  respiratory specimens during the acute phase of infection. The lowest  concentration of SARS-CoV-2 viral copies this assay can detect is 250  copies / mL. A negative result does not preclude SARS-CoV-2 infection  and should not be used as the sole basis for treatment or other  patient management decisions.  A negative result may occur with  improper specimen collection / handling, submission of specimen other  than nasopharyngeal swab, presence of viral mutation(s) within the  areas targeted by this assay, and inadequate number of viral  copies  (<250 copies / mL). A negative result must be combined with clinical  observations, patient history, and epidemiological information. If result is POSITIVE SARS-CoV-2 target nucleic acids are DETECTED. The SARS-CoV-2 RNA is generally detectable in upper and lower  respiratory specimens dur ing the acute phase of infection.  Positive  results are indicative of active infection with SARS-CoV-2.  Clinical  correlation with patient history and other diagnostic information is  necessary to determine patient infection status.  Positive results do  not rule out bacterial infection or co-infection with other viruses. If result is PRESUMPTIVE POSTIVE SARS-CoV-2 nucleic acids MAY BE PRESENT.   A presumptive positive result was obtained on the submitted specimen  and confirmed on repeat testing.  While 2019 novel coronavirus  (SARS-CoV-2) nucleic acids may be present in the submitted sample  additional confirmatory testing may be necessary for epidemiological  and / or clinical management purposes  to differentiate between  SARS-CoV-2 and other Sarbecovirus currently known to infect humans.  If clinically indicated additional testing with an alternate test  methodology [phone]) is advised. The SARS-CoV-2 RNA is generally  detectable  in upper and lower respiratory sp ecimens during the acute  phase of infection. The expected result is Negative. Fact Sheet for Patients:  StrictlyIdeas.no Fact Sheet for Healthcare Providers: BankingDealers.co.za This test is not yet approved or cleared by the [place] FDA and has been authorized for detection and/or diagnosis of SARS-CoV-2 by FDA under an Emergency Use Authorization (EUA).  This EUA will remain in effect (meaning this test can be used) for the duration of the COVID-19 declaration under Section 564(b)(1) of the Act, 21 U.S.C. section 360bbb-3(b)(1), unless the authorization is terminated or revoked sooner. Performed at [facility], [place] [address], [place], [place] [address]      Liver Function Tests: Recent Labs  Lab 04/07/19 1530  AST 31  ALT 15  ALKPHOS 56  BILITOT 1.4*  PROT 6.9  ALBUMIN 4.7   BNP (last 3 results) Recent Labs    01/20/19 0941  BNP 852.0*       Studies: Dg Lumbar Spine Complete  Result Date: 04/07/2019 CLINICAL DATA:  Pain EXAM: LUMBAR SPINE - COMPLETE 4+ VIEW COMPARISON:  None. FINDINGS: Leftward scoliosis in the lumbar spine. Diffuse osteopenia. Diffuse degenerative disc and facet disease. No fracture. Aortic calcifications. No aneurysm. IVC filter remains in place, unchanged. IMPRESSION: Leftward scoliosis. Advanced degenerative disc and facet disease diffusely. No acute bony abnormality. Osteopenia. Electronically Signed   By: Rolm Baptise M.D.   On: 04/07/2019 13:36   Ct Renal Stone Study  Result Date: 04/07/2019 CLINICAL DATA:  Bilateral hip, back, flank pain for several days. EXAM: CT ABDOMEN AND PELVIS WITHOUT CONTRAST TECHNIQUE: Multidetector CT imaging of the abdomen and pelvis was performed following the standard protocol without IV contrast. COMPARISON:  08/06/2014 CT abdomen/pelvis. FINDINGS: Lower chest: Scattered small parenchymal bands in the  dependent lung bases bilaterally, compatible with mild scarring or atelectasis. Cardiomegaly. Coronary atherosclerosis. Hepatobiliary: Normal liver size. No liver mass. Normal gallbladder with no radiopaque cholelithiasis. No biliary ductal dilatation. Pancreas: Normal, with no mass or duct dilation. Spleen: Normal size. No mass. Adrenals/Urinary Tract: Normal adrenals. Punctate nonobstructing lower left renal stone. No additional renal stones. No hydronephrosis. Slightly hyperdense 1.8 cm renal cortical lesion in the anterior interpolar left kidney with density 53 HU (series 3/image 25), stable since 08/06/2014 CT. No additional contour deforming renal lesions. Bladder obscured by streak artifact from left hip hardware and  nondistended. No gross bladder abnormality. Stomach/Bowel: Normal non-distended stomach. Apparent right hemicolectomy with ileocolic anastomosis in the midline abdomen. No small bowel dilatation or wall thickening. Moderate stool throughout the remnant large-bowel. No large bowel wall thickening, significant diverticulosis or acute pericolonic fat stranding. Vascular/Lymphatic: Atherosclerotic nonaneurysmal abdominal aorta. Infrarenal IVC filter is stable in position. No pathologically enlarged lymph nodes in the abdomen or pelvis. Reproductive: Status post hysterectomy, with no abnormal findings at the vaginal cuff. No adnexal mass. Other: No pneumoperitoneum, ascites or focal fluid collection. Musculoskeletal: No aggressive appearing focal osseous lesions. Marked thoracolumbar spondylosis. Left total hip arthroplasty. IMPRESSION: 1. No acute abnormality. Apparent right hemicolectomy. No evidence of bowel obstruction or acute bowel inflammation. Moderate stool throughout the remnant large-bowel, suggesting constipation. 2. Punctate nonobstructing lower left renal stone. No hydronephrosis. No ureteral or bladder stones. 3. Slightly hyperdense 1.8 cm renal cortical lesion in the anterior  interpolar left kidney. While technically indeterminate, lesion is stable since 2016 CT, probably a benign hemorrhagic/proteinaceous renal cyst. 4. Aortic Atherosclerosis (ICD10-I70.0). Electronically Signed   By: Ilona Sorrel M.D.   On: 04/07/2019 19:50   Dg Hips Bilat W Or Wo Pelvis 2 Views  Result Date: 04/07/2019 CLINICAL DATA:  Bilateral hip pain without known injury. EXAM: DG HIP (WITH OR WITHOUT PELVIS) 2V BILAT COMPARISON:  Radiographs of January 20, 2019. FINDINGS: Status post left total hip arthroplasty. No acute fracture or dislocation is noted. Right hip is unremarkable. IMPRESSION: No acute abnormality seen involving either hip. Status post left total hip arthroplasty. Electronically Signed   By: Marijo Conception M.D.   On: 04/07/2019 13:40     Admission status: Inpatient: Based on patients clinical presentation and evaluation of above clinical data, I have made determination that patient meets Inpatient criteria at this time.  Time spent: 20 min  [facility] Pager [phone]. If 7PM-7AM, please contact night-coverage at [email or web address], Office  [phone]  password [email or web address]  04/09/2019, 1:32 PM  LOS: 1 day

## 2019-04-09 NOTE — Progress Notes (Addendum)
Initial Nutrition Assessment  DOCUMENTATION CODES:   Obesity unspecified  INTERVENTION:   -Ensure Enlive po BID, each supplement provides 350 kcal and 20 grams of protein  NUTRITION DIAGNOSIS:   Inadequate oral intake related to poor appetite as evidenced by per patient/family report.  GOAL:   Patient will meet greater than or equal to 90% of their needs  MONITOR:   PO intake, Supplement acceptance, Labs, Weight trends, I & O's  REASON FOR ASSESSMENT:   Malnutrition Screening Tool    ASSESSMENT:   85 year old female with history of atrial fibrillation, pulmonary embolism on Coumadin, hypertension, hyperlipidemia, history of CVA, CKD stage III, asthma, thrombocytopenia, history of colon cancer, mild cognitive impairment who came to the ED for evaluation of confusion and lower back pain. Admitted for AKI.  **RD working remotely**  Patient with history of intermittent confusion. Pt has poor appetite. Consumed 20% of breakfast this morning. Patient drank an Ensure this morning.  Patient has not had a BM since 9/30, has been ordered Miralax and Colace.  Per weight records, pt has lost 15 lbs since January 2020 (7% wt loss x 9 months, insignificant for time frame).  Admission weight: 170 lbs. Current weight: 173 lbs. I/Os: +612 ml UOP 10/3: 400 ml  Medications reviewed. Labs reviewed: Elevated Na   NUTRITION - FOCUSED PHYSICAL EXAM:  Unable to perform -working remotely.  Diet Order:   Diet Order            Diet Heart Room service appropriate? Yes; Fluid consistency: Thin  Diet effective now              EDUCATION NEEDS:   No education needs have been identified at this time  Skin:  Skin Assessment: Skin Integrity Issues: Skin Integrity Issues:: Other (Comment) Other: Pressure injury on buttocks  Last BM:  9/30  Height:   Ht Readings from Last 1 Encounters:  04/07/19 5\' 3"  (1.6 m)    Weight:   Wt Readings from Last 1 Encounters:  04/08/19 78.7  kg    Ideal Body Weight:  52.3 kg  BMI:  Body mass index is 30.73 kg/m.  Estimated Nutritional Needs:   Kcal:  1450-1650  Protein:  60-70g  Fluid:  1.5L/day  Clayton Bibles, MS, RD, LDN Inpatient Clinical Dietitian Pager: [phone] After Hours Pager: [phone]

## 2019-04-10 LAB — BASIC METABOLIC PANEL
Anion gap: 8 (ref 5–15)
BUN: 35 mg/dL — ABNORMAL HIGH (ref 8–23)
CO2: 26 mmol/L (ref 22–32)
Calcium: 8.7 mg/dL — ABNORMAL LOW (ref 8.9–10.3)
Chloride: 112 mmol/L — ABNORMAL HIGH (ref 98–111)
Creatinine, Ser: 1.5 mg/dL — ABNORMAL HIGH (ref 0.44–1.00)
GFR calc Af Amer: 35 mL/min — ABNORMAL LOW (ref >60)
GFR calc non Af Amer: 30 mL/min — ABNORMAL LOW (ref >60)
Glucose, Bld: 87 mg/dL (ref 70–99)
Potassium: 4.3 mmol/L (ref 3.5–5.1)
Sodium: 146 mmol/L — ABNORMAL HIGH (ref 135–145)

## 2019-04-10 LAB — URINE CULTURE: Culture: >=100000 — AB

## 2019-04-10 LAB — PROTIME-INR
INR: 2.8 — ABNORMAL HIGH (ref 0.8–1.2)
Prothrombin Time: 29.1 seconds — ABNORMAL HIGH (ref 11.4–15.2)

## 2019-04-10 MED ORDER — WARFARIN SODIUM 2 MG PO TABS
2.0000 mg | ORAL_TABLET | Freq: Once | ORAL | Status: AC
  Administered 2019-04-10: 2 mg via ORAL
  Filled 2019-04-10: qty 1

## 2019-04-10 MED ORDER — WARFARIN - PHARMACIST DOSING INPATIENT: Freq: Every day | Status: DC

## 2019-04-10 NOTE — Progress Notes (Signed)
[facility]  PROGRESS NOTE  [name] [id number] DOB: [birth year] DOA: 04/07/2019 PCP: Colon Branch, MD   Brief HPI:   85 year old female with history of atrial fibrillation, pulmonary embolism on Coumadin, hypertension, hyperlipidemia, history of CVA, CKD stage III, asthma, thrombocytopenia, history of colon cancer, mild cognitive impairment who came to the ED for evaluation of confusion and lower back pain.  Patient reported lower back pain difficulty walking.  Denied trauma or falls.  There is also question of confusion with foul-smelling urine.   Subjective   Patient seen and examined, developed shortness of breath last night.  Was given albuterol as needed.  She is currently on 2 L/min of oxygen via nasal cannula.   Assessment/Plan:    1. Acute kidney injury on CKD stage III-likely in setting of dehydration, poor p.o. intake and Lasix use.  Started on IV normal saline.  Creatinine has improved from 2.34-1.62 today.  Baseline creatinine around 1.3-1.4 as of July 2020.   2. UTI-urine culture growing Klebsiella pneumoniae.  Klebsiella is sensitive to ceftriaxone.  We will continue with antibiotics at this time.  3. Low back pain-patient has chronic degenerative disc disease.  X-ray of the lumbar spine showed no acute process.  PT OT has been consulted.  Patient was started on oxycodone 5 mg every 6 hours PRN.  Continue Tylenol PRN for pain.  4. Encephalopathy-resolved, patient has history of intermittent confusion.  UA was not convincing for UTI.  Back to baseline.  Will monitor.  Continue home Seroquel.  5. Permanent atrial fibrillation-EKG showed atrial fibrillation, heart rate is controlled.CHA2DS2VASc score is 8.  Patient is on anticoagulation with Coumadin.  Continue metoprolol, verapamil for rate control.  6. Chronic diastolic CHF-EF 60 to [id number] by TTE on 01/21/2019.  Lasix was initially held.  Now patient requiring oxygen.  Will discontinue IV fluids.  Might have to  restart Lasix at a lower dose.  Will monitor patient closely over next 24 hours.  7. History of CVA-continue aspirin, atorvastatin.  8. Hypertension-blood pressure stable, continue metoprolol, verapamil.  9. Constipation-moderate stool burden noted on CT scan.  Started on Colace.  Will start MiraLAX 17 g daily.    CBC: Recent Labs  Lab 04/07/19 1530 04/08/19 0021  WBC 8.2 6.9  NEUTROABS 6.5  --   HGB 13.3 12.7  HCT 41.2 40.0  MCV 95.2 97.6  PLT 75* 76*    Basic Metabolic Panel: Recent Labs  Lab 04/07/19 1530 04/08/19 0021 04/09/19 0456 04/10/19 0557  NA 143 144 147* 146*  K 3.4* 3.5 3.9 4.3  CL 106 107 113* 112*  CO2 23 27 27 26   GLUCOSE 119* 114* 99 87  BUN 47* 41* 36* 35*  CREATININE 2.34* 1.96* 1.62* 1.50*  CALCIUM 9.9 9.2 8.8* 8.7*  MG  --  2.2  --   --   PHOS  --  4.0  --   --      DVT prophylaxis: Coumadin  Code Status: Full code  Family Communication: No family at bedside  Disposition Plan: likely home when medically ready for discharge Pressure Injury 04/08/19 Buttocks (Active)  04/08/19 0821  Location: Buttocks  Location Orientation:   Staging:   Wound Description (Comments):   Present on Admission:      BMI  Estimated body mass index is 32.02 kg/m as calculated from the following:   Height as of this encounter: 5\' 3"  (1.6 m).   Weight as of this encounter: 82 kg.  Scheduled medications:  .  atorvastatin  20 mg Oral q1800  . docusate sodium  100 mg Oral BID  . feeding supplement (ENSURE ENLIVE)  237 mL Oral BID BM  . isosorbide mononitrate  15 mg Oral Daily  . metoprolol tartrate  37.5 mg Oral BID  . mometasone-formoterol  2 puff Inhalation BID  . polyethylene glycol  17 g Oral Daily  . QUEtiapine  50 mg Oral QHS  . verapamil  120 mg Oral QHS  . warfarin  2 mg Oral ONCE-1800  . Warfarin - Pharmacist Dosing Inpatient   Does not apply q1800    Consultants:    Procedures:     Antibiotics:   Anti-infectives (From  admission, onward)   Start     Dose/Rate Route Frequency Ordered Stop   04/09/19 1200  cefTRIAXone (ROCEPHIN) 1 g in sodium chloride 0.9 % 100 mL IVPB     1 g 200 mL/hr over 30 Minutes Intravenous Every 24 hours 04/09/19 1053     04/07/19 2130  cefTRIAXone (ROCEPHIN) 1 g in sodium chloride 0.9 % 100 mL IVPB     1 g 200 mL/hr over 30 Minutes Intravenous  Once 04/07/19 2126 04/08/19 [id number]       Objective   Vitals:   04/09/19 2059 04/10/19 0523 04/10/19 0719 04/10/19 1000  BP:  118/61    Pulse:  69  68  Resp:  16    Temp:  (!) 97.5 F (36.4 C)    TempSrc:  Oral    SpO2: 98% 99% 99%   Weight:  82 kg    Height:        Intake/Output Summary (Last 24 hours) at 04/10/2019 1038 Last data filed at 04/10/2019 1000 Gross per 24 hour  Intake 1723.21 ml  Output 1000 ml  Net 723.21 ml   Filed Weights   04/07/19 2340 04/08/19 0530 04/10/19 0523  Weight: 78.7 kg 78.7 kg 82 kg     Physical Examination:  General-appears in no acute distress Heart-S1-S2, regular, no murmur auscultated Lungs-clear to auscultation bilaterally, no wheezing or crackles auscultated Abdomen-soft, nontender, no organomegaly Extremities-no edema in the lower extremities Neuro-alert, oriented x3, no focal deficit noted    Data Reviewed: I have personally reviewed following labs and imaging studies   Recent Results (from the past 240 hour(s))  Urine culture     Status: Abnormal   Collection Time: 04/07/19 12:12 PM   Specimen: Urine, Random  Result Value Ref Range Status   Specimen Description   Final    URINE, RANDOM Performed at [facility], [place] [address] [place], [place] [address]    Special Requests   Final    NONE Performed at [facility], [place] [address] [place], [place] [address]    Culture >=100,000 COLONIES/mL KLEBSIELLA PNEUMONIAE (A)  Final   Report Status 04/10/2019 FINAL  Final   Organism ID, Bacteria KLEBSIELLA PNEUMONIAE (A)  Final       Susceptibility   Klebsiella pneumoniae - MIC*    AMPICILLIN >=32 RESISTANT Resistant     CEFAZOLIN <=4 SENSITIVE Sensitive     CEFTRIAXONE <=1 SENSITIVE Sensitive     CIPROFLOXACIN <=0.25 SENSITIVE Sensitive     GENTAMICIN <=1 SENSITIVE Sensitive     IMIPENEM 0.5 SENSITIVE Sensitive     NITROFURANTOIN 64 INTERMEDIATE Intermediate     TRIMETH/SULFA <=20 SENSITIVE Sensitive     AMPICILLIN/SULBACTAM 8 SENSITIVE Sensitive     PIP/TAZO <=4 SENSITIVE Sensitive     Extended ESBL NEGATIVE Sensitive     * >=  100,000 COLONIES/mL KLEBSIELLA PNEUMONIAE  SARS Coronavirus 2 [facility] order, Performed in [facility] hospital lab) Nasopharyngeal Nasopharyngeal Swab     Status: None   Collection Time: 04/07/19 10:30 PM   Specimen: Nasopharyngeal Swab  Result Value Ref Range Status   SARS Coronavirus 2 NEGATIVE NEGATIVE Final    Comment: (NOTE) If result is NEGATIVE SARS-CoV-2 target nucleic acids are NOT DETECTED. The SARS-CoV-2 RNA is generally detectable in upper and lower  respiratory specimens during the acute phase of infection. The lowest  concentration of SARS-CoV-2 viral copies this assay can detect is 250  copies / mL. A negative result does not preclude SARS-CoV-2 infection  and should not be used as the sole basis for treatment or other  patient management decisions.  A negative result may occur with  improper specimen collection / handling, submission of specimen other  than nasopharyngeal swab, presence of viral mutation(s) within the  areas targeted by this assay, and inadequate number of viral copies  (<250 copies / mL). A negative result must be combined with clinical  observations, patient history, and epidemiological information. If result is POSITIVE SARS-CoV-2 target nucleic acids are DETECTED. The SARS-CoV-2 RNA is generally detectable in upper and lower  respiratory specimens dur ing the acute phase of infection.  Positive  results are indicative of active infection with  SARS-CoV-2.  Clinical  correlation with patient history and other diagnostic information is  necessary to determine patient infection status.  Positive results do  not rule out bacterial infection or co-infection with other viruses. If result is PRESUMPTIVE POSTIVE SARS-CoV-2 nucleic acids MAY BE PRESENT.   A presumptive positive result was obtained on the submitted specimen  and confirmed on repeat testing.  While 2019 novel coronavirus  (SARS-CoV-2) nucleic acids may be present in the submitted sample  additional confirmatory testing may be necessary for epidemiological  and / or clinical management purposes  to differentiate between  SARS-CoV-2 and other Sarbecovirus currently known to infect humans.  If clinically indicated additional testing with an alternate test  methodology [phone]) is advised. The SARS-CoV-2 RNA is generally  detectable in upper and lower respiratory sp ecimens during the acute  phase of infection. The expected result is Negative. Fact Sheet for Patients:  StrictlyIdeas.no Fact Sheet for Healthcare Providers: BankingDealers.co.za This test is not yet approved or cleared by the [place] FDA and has been authorized for detection and/or diagnosis of SARS-CoV-2 by FDA under an Emergency Use Authorization (EUA).  This EUA will remain in effect (meaning this test can be used) for the duration of the COVID-19 declaration under Section 564(b)(1) of the Act, 21 U.S.C. section 360bbb-3(b)(1), unless the authorization is terminated or revoked sooner. Performed at [facility], [place] [address], [place], [place] [address]      Liver Function Tests: Recent Labs  Lab 04/07/19 1530  AST 31  ALT 15  ALKPHOS 56  BILITOT 1.4*  PROT 6.9  ALBUMIN 4.7   BNP (last 3 results) Recent Labs    01/20/19 0941  BNP 852.0*     Admission status: Inpatient: Based on patients clinical presentation and  evaluation of above clinical data, I have made determination that patient meets Inpatient criteria at this time.  Time spent: 20 min  [facility] Pager [phone]. If 7PM-7AM, please contact night-coverage at [email or web address], Office  [phone]  password [email or web address]  04/10/2019, 10:38 AM  LOS: 2 days

## 2019-04-10 NOTE — Progress Notes (Signed)
Occupational Therapy Treatment Patient Details Name: [name] MRN: [id number] DOB: 03/08/1927 Today's Date: 04/10/2019    History of present illness [name] is a 85 y.o. female with medical history significant for atrial fibrillation and history of PE on Coumadin, hypertension, hyperlipidemia, history of CVA, CKD stage III, asthma, thrombocytopenia, history of colon cancer, and mild cognitive impairment who presented to the ED for evaluation of confusion and lower back pain.  Family reports increased difficulty with ambulation.   OT comments  Pt in bed with sheets wet upon therapy arrival.  Pt had not called for A. Educated pt on importance of calling for A as needed  Follow Up Recommendations  SNF;Supervision/Assistance - 24 hour(depending upon progress)    Equipment Recommendations  3 in 1 bedside commode(if tolerated)    Recommendations for Other Services      Precautions / Restrictions Precautions Precautions: Fall Restrictions Weight Bearing Restrictions: No       Mobility Bed Mobility Overal bed mobility: Needs Assistance Bed Mobility: Supine to Sit     Supine to sit: Min assist;+2 for physical assistance     General bed mobility comments: Pt able to initiate transfer today with LE, but needed A to complete transfer  Transfers Overall transfer level: Needs assistance   Transfers: Sit to/from Stand;Stand Pivot Transfers Sit to Stand: Mod assist;+2 physical assistance Stand pivot transfers: Mod assist;+2 physical assistance       General transfer comment: MOD A of 2 to power up with cues for hand placement. Cues for steps and to complete transfer prior to sitting        ADL either performed or assessed with clinical judgement   ADL Overall ADL's : Needs assistance/impaired Eating/Feeding: Set up;Sitting   Grooming: Set up;Sitting               Lower Body Dressing: +2 for safety/equipment;+2 for physical assistance;Maximal  assistance;Cueing for sequencing   Toilet Transfer: RW;+2 for safety/equipment;Moderate assistance Toilet Transfer Details (indicate cue type and reason): bed to chair Toileting- Clothing Manipulation and Hygiene: Total assistance;+2 for safety/equipment;Sit to/from stand         General ADL Comments: pt much improved this day. Pt with less pain and increased  participation     Vision Patient Visual Report: No change from baseline            Cognition Arousal/Alertness: Awake/alert Behavior During Therapy: WFL for tasks assessed/performed Overall Cognitive Status: Within Functional Limits for tasks assessed                                                General Comments Pt bit lip during transfer causing her lip to start bleeding.     Pertinent Vitals/ Pain       Pain Assessment: Faces Faces Pain Scale: Hurts little more Pain Location: low back and R hip Pain Descriptors / Indicators: Grimacing Pain Intervention(s): Limited activity within patient's tolerance;Monitored during session;Premedicated before session     Prior Functioning/Environment              Frequency  Min 2X/week        Progress Toward Goals  OT Goals(current goals can now be found in the care plan section)  Progress towards OT goals: Progressing toward goals  Acute Rehab OT Goals Patient Stated Goal: get rid of pain  Plan Discharge  plan remains appropriate    Co-evaluation      Reason for Co-Treatment: For patient/therapist safety PT goals addressed during session: Mobility/safety with mobility;Balance;Proper use of DME        AM-PAC OT "6 Clicks" Daily Activity     Outcome Measure   Help from another person eating meals?: None Help from another person taking care of personal grooming?: A Little Help from another person toileting, which includes using toliet, bedpan, or urinal?: A Lot Help from another person bathing (including washing, rinsing, drying)?: A  Lot Help from another person to put on and taking off regular upper body clothing?: A Lot Help from another person to put on and taking off regular lower body clothing?: A Lot 6 Click Score: 15    End of Session Equipment Utilized During Treatment: Rolling walker  OT Visit Diagnosis: Muscle weakness (generalized) (M62.81)   Activity Tolerance Patient limited by pain   Patient Left with call bell/phone within reach;with bed alarm set;in chair   Nurse Communication          Time: [id number] OT Time Calculation (min): 19 min  Charges: OT General Charges $OT Visit: 1 Visit OT Treatments $Self Care/Home Management : 8-22 mins  Kari Baars, OT Acute Rehabilitation Services [phone] Office- [phone]      Danicka Hourihan, Edwena Felty D 04/10/2019, 12:31 PM

## 2019-04-10 NOTE — Consult Note (Signed)
   [facility] CM Inpatient Consult   04/10/2019  [name] [facility] [birth year] [id number]   Patient screened for potential [facility] Care Management services due to unplanned readmission risk score of 32%, extreme.  Chart review reveals current recommendation is for SNF. Will continue to follow for progression and disposition plans.  [name], MSN, [facility] Liaison Nurse Mobile Phone [phone]  Toll free office [phone]

## 2019-04-10 NOTE — Progress Notes (Signed)
Physical Therapy Treatment Patient Details Name: [name] MRN: [id number] DOB: 1927/04/13 Today's Date: 04/10/2019    History of Present Illness [name] is a 85 y.o. female with medical history significant for atrial fibrillation and history of PE on Coumadin, hypertension, hyperlipidemia, history of CVA, CKD stage III, asthma, thrombocytopenia, history of colon cancer, and mild cognitive impairment who presented to the ED for evaluation of confusion and lower back pain.  Family reports increased difficulty with ambulation.    PT Comments    Pt pre-medicated prior to session and was able to transfer to EOB and then get to recliner with MOD A of 2. Spoke with pt who is interested in going to rehab and agree that short term SNF rehab is a good idea for this patient.   Follow Up Recommendations  SNF;Supervision/Assistance - 24 hour     Equipment Recommendations  None recommended by PT    Recommendations for Other Services       Precautions / Restrictions Precautions Precautions: Fall Restrictions Weight Bearing Restrictions: No    Mobility  Bed Mobility Overal bed mobility: Needs Assistance Bed Mobility: Supine to Sit     Supine to sit: Min assist;+2 for physical assistance     General bed mobility comments: Pt able to initiate transfer today with LE, but needed A to complete transfer  Transfers Overall transfer level: Needs assistance   Transfers: Sit to/from Stand;Stand Pivot Transfers Sit to Stand: Mod assist;+2 physical assistance Stand pivot transfers: Mod assist;+2 physical assistance       General transfer comment: MOD A of 2 to power up with cues for hand placement. Cues for steps and to complete transfer prior to sitting  Ambulation/Gait                 Stairs             Wheelchair Mobility    Modified Rankin (Stroke Patients Only)       Balance                                            Cognition  Arousal/Alertness: Awake/alert Behavior During Therapy: WFL for tasks assessed/performed Overall Cognitive Status: Within Functional Limits for tasks assessed                                        Exercises      General Comments General comments (skin integrity, edema, etc.): Pt bit lip during transfer causing her lip to start bleeding.       Pertinent Vitals/Pain Pain Assessment: Faces Faces Pain Scale: Hurts little more Pain Location: low back and R hip Pain Descriptors / Indicators: Grimacing Pain Intervention(s): Limited activity within patient's tolerance;Monitored during session;Premedicated before session    Home Living                      Prior Function            PT Goals (current goals can now be found in the care plan section) Acute Rehab PT Goals Patient Stated Goal: get rid of pain Potential to Achieve Goals: Good Progress towards PT goals: Progressing toward goals    Frequency    Min 2X/week      PT Plan Discharge plan  needs to be updated;Frequency needs to be updated    Co-evaluation PT/OT/SLP Co-Evaluation/Treatment: Yes Reason for Co-Treatment: For patient/therapist safety PT goals addressed during session: Mobility/safety with mobility;Balance;Proper use of DME        AM-PAC PT "6 Clicks" Mobility   Outcome Measure  Help needed turning from your back to your side while in a flat bed without using bedrails?: A Lot Help needed moving from lying on your back to sitting on the side of a flat bed without using bedrails?: A Lot Help needed moving to and from a bed to a chair (including a wheelchair)?: A Lot Help needed standing up from a chair using your arms (e.g., wheelchair or bedside chair)?: A Lot Help needed to walk in hospital room?: Total Help needed climbing 3-5 steps with a railing? : Total 6 Click Score: 10    End of Session Equipment Utilized During Treatment: Gait belt;Oxygen Activity Tolerance: Patient  tolerated treatment well Patient left: in chair;with call bell/phone within reach;with chair alarm set Nurse Communication: Mobility status PT Visit Diagnosis: Other abnormalities of gait and mobility (R26.89);Pain Pain - Right/Left: Right Pain - part of body: Hip     Time: [id number] PT Time Calculation (min) (ACUTE ONLY): 18 min  Charges:  $Therapeutic Activity: 8-22 mins                     [name], [place] Pager [phone] 04/10/2019    [name] 04/10/2019, 11:08 AM

## 2019-04-10 NOTE — Progress Notes (Signed)
ANTICOAGULATION CONSULT NOTE - Follow Up Consult  Pharmacy Consult for warfarin Indication: hx atrial fibrillation  No Known Allergies  Patient Measurements: Height: 5\' 3"  (160 cm) Weight: 180 lb 12.4 oz (82 kg) IBW/kg (Calculated) : 52.4 Heparin Dosing Weight:   Vital Signs: Temp: 97.5 F (36.4 C) (10/05 0523) Temp Source: Oral (10/05 0523) BP: 118/61 (10/05 0523) Pulse Rate: 69 (10/05 0523)  Labs: Recent Labs    04/07/19 1530 04/08/19 0021 04/09/19 0456 04/09/19 0542 04/10/19 0557  HGB 13.3 12.7  --   --   --   HCT 41.2 40.0  --   --   --   PLT 75* 76*  --   --   --   LABPROT  --  35.5*  --  36.5* 29.1*  INR  --  3.6*  --  3.8* 2.8*  CREATININE 2.34* 1.96* 1.62*  --  1.50*    Estimated Creatinine Clearance: 25.3 mL/min (A) (by C-G formula based on SCr of 1.5 mg/dL (H)).   Medications:  - PTA warfarin regimen: 2.5 mg daily (last dose taken on 10/1)  Assessment: Patient is a 85 y.o F with hx afib on warfarin PTA, presented to the ED on 10/2 with c/o AMS, back and hip pain.  Warfarin resumed on admission.  Today, 04/10/2019: - INR has decreased to 2.8 (within therapeutic range) (warfarin doses held 10/2-10/4 due to supra- therapeutic INR) - hgb stable; plts low but stable at 76 (was 75 in July 2020)  - no bleeding documented - no significant drug-drug intxns   Goal of Therapy:  INR 2-3 Monitor platelets by anticoagulation protocol: Yes   Plan:  -Warfarin 2mg  po x 1 dose today - Daily INR - monitor for s/s bleeding   Jered Heiny, Toribio Harbour, PharmD 04/10/2019,7:41 AM

## 2019-04-11 LAB — BASIC METABOLIC PANEL
Anion gap: 10 (ref 5–15)
BUN: 31 mg/dL — ABNORMAL HIGH (ref 8–23)
CO2: 27 mmol/L (ref 22–32)
Calcium: 9 mg/dL (ref 8.9–10.3)
Chloride: 111 mmol/L (ref 98–111)
Creatinine, Ser: 1.28 mg/dL — ABNORMAL HIGH (ref 0.44–1.00)
GFR calc Af Amer: 43 mL/min — ABNORMAL LOW (ref >60)
GFR calc non Af Amer: 37 mL/min — ABNORMAL LOW (ref >60)
Glucose, Bld: 87 mg/dL (ref 70–99)
Potassium: 4.3 mmol/L (ref 3.5–5.1)
Sodium: 148 mmol/L — ABNORMAL HIGH (ref 135–145)

## 2019-04-11 LAB — PROTIME-INR
INR: 2.5 — ABNORMAL HIGH (ref 0.8–1.2)
Prothrombin Time: 26.2 seconds — ABNORMAL HIGH (ref 11.4–15.2)

## 2019-04-11 MED ORDER — WARFARIN SODIUM 2.5 MG PO TABS
2.5000 mg | ORAL_TABLET | Freq: Once | ORAL | Status: AC
  Administered 2019-04-11: 17:00:00 2.5 mg via ORAL
  Filled 2019-04-11: qty 1

## 2019-04-11 MED ORDER — FLEET ENEMA 7-19 GM/118ML RE ENEM
1.0000 | ENEMA | Freq: Once | RECTAL | Status: AC
  Administered 2019-04-11: 20:00:00 1 via RECTAL
  Filled 2019-04-11: qty 1

## 2019-04-11 MED ORDER — FUROSEMIDE 40 MG PO TABS
40.0000 mg | ORAL_TABLET | Freq: Every day | ORAL | Status: DC
  Administered 2019-04-11 – 2019-04-12 (×2): 40 mg via ORAL
  Filled 2019-04-11 (×2): qty 1

## 2019-04-11 NOTE — Progress Notes (Signed)
I concur with the previous RN documentation.

## 2019-04-11 NOTE — NC FL2 (Signed)
[place] LEVEL OF CARE SCREENING TOOL     IDENTIFICATION  Patient Name: [name] Birthdate: 1926/09/17 Sex: female Admission Date (Current Location): 04/07/2019  [facility] and [place] Number:  Herbalist and Address:  [facility],  [place], [place]      Provider Number: [id number]  Attending Physician Name and Address:  Oswald Hillock, MD  Relative Name and Phone Number:  [place]    Current Level of Care: Hospital Recommended Level of Care: [place] Prior Approval Number:    Date Approved/Denied:   PASRR Number: [id number]  Discharge Plan: SNF    Current Diagnoses: Patient Active Problem List   Diagnosis Date Noted  . Back pain 04/08/2019  . Acute kidney injury ([place]) 04/07/2019  . MCI (mild cognitive impairment) 03/07/2019  . Congestive heart failure (CHF) ([place]) 01/20/2019  . E. coli UTI 07/21/2018  . Acute kidney injury superimposed on chronic kidney disease ([place]) 07/21/2018  . CAD (coronary artery disease) 06/07/2018  . Chronic diastolic (congestive) heart failure ([place]) 06/07/2018  . Pulmonary hypertension, primary ([place]) 06/07/2018  . Generalized weakness   . Abnormal LFTs 06/01/2018  . Hallucination 06/01/2018  . Abnormal chest x-ray 09/28/2015  . Edema of right lower extremity 09/28/2015  . Elevated brain natriuretic peptide (BNP) level 09/17/2015  . Seizures ([place]) 09/16/2015  . Asthma 08/14/2015  . PCP NOTES >>>>>>>>>>>>>>>>>>>>>>>>>>>>>> 04/24/2015  . TIA (transient ischemic attack) 12/11/2014  . Dyslipidemia 12/11/2014  . History of CVA (cerebrovascular accident) 12/01/2014  . Thrombocytopenia ([place]) 12/01/2014  . Chronic renal insufficiency, stage II (mild) 11/12/2013  . Impingement syndrome, shoulder, left 11/09/2013  . Acquired short bowel syndrome 11/09/2013  . Annual physical exam 08/30/2013  . Hx pulmonary embolism 06/27/2013  . Warfarin  anticoagulation 06/27/2013  . DDD (degenerative disc disease), lumbar 11/18/2012  . Mitral regurgitation 08/26/2011  . DYSPHAGIA UNSPECIFIED 07/31/2010  . ATRIAL FIBRILLATION   07/08/2010  . COLON CANCER, HX OF 07/08/2010  . DIZZINESS 10/18/2009  . Essential hypertension 12/16/2007  . HIP PAIN, CHRONIC 12/28/2006  . ANEMIA-NOS 07/15/2006  . Personal history of venous thrombosis and embolism 07/15/2006    Orientation RESPIRATION BLADDER Height & Weight     Self, Situation, Place  O2(1L (none at baseline)) Continent Weight: 180 lb 12.4 oz (82 kg) Height:  5\' 3"  (160 cm)  BEHAVIORAL SYMPTOMS/MOOD NEUROLOGICAL BOWEL NUTRITION STATUS      Incontinent Diet(heart healthy)  AMBULATORY STATUS COMMUNICATION OF NEEDS Skin   Extensive Assist Verbally PU Stage and Appropriate Care(pressure injury buttocks, foam dressing changes PRN)                       Personal Care Assistance Level of Assistance  Bathing, Feeding, Dressing Bathing Assistance: Maximum assistance Feeding assistance: Limited assistance(set up) Dressing Assistance: Limited assistance(set up)     Functional Limitations Info  Sight, Hearing, Speech Sight Info: Adequate Hearing Info: Adequate Speech Info: Adequate    SPECIAL CARE FACTORS FREQUENCY  PT (By licensed PT), OT (By licensed OT)     PT Frequency: 5x OT Frequency: 5x            Contractures Contractures Info: Not present    Additional Factors Info  Code Status, Allergies Code Status Info: full code Allergies Info: nka           Current Medications (04/11/2019):  This is the current hospital active medication list Current Facility-Administered Medications  Medication Dose  Route Frequency Provider Last Rate Last Dose  . acetaminophen (TYLENOL) tablet 650 mg  650 mg Oral Q6H PRN Lenore Cordia, MD   650 mg at 04/11/19 0143   Or  . acetaminophen (TYLENOL) suppository 650 mg  650 mg Rectal Q6H PRN Zada Finders R, MD      . albuterol  (PROVENTIL) (2.5 MG/3ML) 0.083% nebulizer solution 3 mL  3 mL Inhalation Q6H PRN Lenore Cordia, MD   3 mL at 04/09/19 2059  . atorvastatin (LIPITOR) tablet 20 mg  20 mg Oral q1800 Lenore Cordia, MD   20 mg at 04/10/19 1732  . cefTRIAXone (ROCEPHIN) 1 g in sodium chloride 0.9 % 100 mL IVPB  1 g Intravenous Q24H Oswald Hillock, MD   Stopped at 04/10/19 1210  . docusate sodium (COLACE) capsule 100 mg  100 mg Oral BID Zada Finders R, MD   100 mg at 04/11/19 1051  . feeding supplement (ENSURE ENLIVE) (ENSURE ENLIVE) liquid 237 mL  237 mL Oral BID BM Zada Finders R, MD   237 mL at 04/11/19 1059  . isosorbide mononitrate (IMDUR) 24 hr tablet 15 mg  15 mg Oral Daily Zada Finders R, MD   15 mg at 04/11/19 1051  . metoprolol tartrate (LOPRESSOR) tablet 37.5 mg  37.5 mg Oral BID Zada Finders R, MD   37.5 mg at 04/11/19 1050  . mometasone-formoterol (DULERA) 100-5 MCG/ACT inhaler 2 puff  2 puff Inhalation BID Oswald Hillock, MD   2 puff at 04/11/19 0749  . oxyCODONE (Oxy IR/ROXICODONE) immediate release tablet 5 mg  5 mg Oral Q6H PRN Oswald Hillock, MD   5 mg at 04/10/19 2053  . polyethylene glycol (MIRALAX / GLYCOLAX) packet 17 g  17 g Oral Daily Oswald Hillock, MD   17 g at 04/11/19 1051  . promethazine (PHENERGAN) tablet 12.5 mg  12.5 mg Oral TID PRN Lenore Cordia, MD      . QUEtiapine (SEROQUEL) tablet 50 mg  50 mg Oral QHS Lenore Cordia, MD   50 mg at 04/10/19 2055  . verapamil (CALAN-SR) CR tablet 120 mg  120 mg Oral QHS Zada Finders R, MD   120 mg at 04/10/19 2055  . Warfarin - Pharmacist Dosing Inpatient   Does not apply [id number] Darrick Meigs, Marge Duncans, MD         Discharge Medications: Please see discharge summary for a list of discharge medications.  Relevant Imaging Results:  Relevant Lab Results:   Additional Information SSN: [place],

## 2019-04-11 NOTE — TOC Initial Note (Signed)
Transition of Care [facility]) - Initial/Assessment Note    Patient Details  Name: [name] MRN: [id number] Date of Birth: 11-01-1926  [facility]) CM/SW Contact:    Nila Nephew, LCSW Phone Number: [phone] 04/11/2019, 11:20 AM  Clinical Narrative:     Pt admitted with acute encephalopathy, AKI. Pt has degenerative disc disease and is dealing with related pain. Daughter reports pt became confused and in greater pain at home PTA which has indicated UTI in the past, therefore she called EMS and pt admitted. Initial symptoms mostly resolved, currently working to resolve shortness of breath (pt has CHF, asthma) and constipation. Pt lives with her daughter and at baseline is independent with her ADLs and uses walker to ambulate. Daughter reports pt has been to SNF for rehab in the past and "did very well, we would love for her to get rehab again." Had [facility] at home over the last couple of months. Completed referrals and pt/daughter selected [facility], report pt has been there in the past and was pleased with therapy program. Will follow to coordinate at DC.  Pt will need covid test result within 72 hours of admitting to [facility].             Expected Discharge Plan: Skilled Nursing Facility Barriers to Discharge: Continued Medical Work up   Patient Goals and CMS Choice Patient states their goals for this hospitalization and ongoing recovery are:: therapy CMS Medicare.gov Compare Post Acute Care list provided to:: Other (Comment Required)(daughter) Choice offered to / list presented to : Adult Children  Expected Discharge Plan and Services Expected Discharge Plan: [place] In-house Referral: Clinical Social Work   Post Acute Care Choice: [place] Living arrangements for the past 2 months: [place] Expected Discharge Date: 04/09/19                                    Prior Living Arrangements/Services Living  arrangements for the past 2 months: Single Family Home Lives with:: Adult Children Patient language and need for interpreter reviewed:: Yes Do you feel safe going back to the place where you live?: Yes      Need for Family Participation in Patient Care: Yes (Comment) Care giver support system in place?: Yes (comment) Current home services: DME, Home OT, Home PT, Home RN Criminal Activity/Legal Involvement Pertinent to Current Situation/Hospitalization: No - Comment as needed  Activities of Daily Living Home Assistive Devices/Equipment: Gilford Rile (specify type) ADL Screening (condition at time of admission) Patient's cognitive ability adequate to safely complete daily activities?: Yes Is the patient deaf or have difficulty hearing?: No Does the patient have difficulty seeing, even when wearing glasses/contacts?: No Does the patient have difficulty concentrating, remembering, or making decisions?: No Patient able to express need for assistance with ADLs?: Yes Does the patient have difficulty dressing or bathing?: Yes Independently performs ADLs?: No Communication: Independent Dressing (OT): Needs assistance Is this a change from baseline?: Change from baseline, expected to last >3 days Grooming: Needs assistance Is this a change from baseline?: Change from baseline, expected to last >3 days Feeding: Independent Bathing: Needs assistance Is this a change from baseline?: Change from baseline, expected to last >3 days Toileting: Needs assistance Is this a change from baseline?: Change from baseline, expected to last >3days In/Out Bed: Needs assistance Is this a change from baseline?: Change from baseline, expected to last >3  days Walks in Home: Independent with device (comment)(walker) Does the patient have difficulty walking or climbing stairs?: Yes Weakness of Legs: Both Weakness of Arms/Hands: Both  Permission Sought/Granted Permission sought to share information with : Family  Supports Permission granted to share information with : Yes, Verbal Permission Granted  Share Information with NAME: daughter [name] [phone]           Emotional Assessment       Orientation: : Oriented to Self, Oriented to Place, Oriented to Situation Alcohol / Substance Use: Not Applicable Psych Involvement: No (comment)  Admission diagnosis:  Acute cystitis without hematuria [N30.00] AKI (acute kidney injury) ([place]) [N17.9] Ambulatory dysfunction [R26.2] Acute bilateral low back pain without sciatica [M54.5] Patient Active Problem List   Diagnosis Date Noted  . Back pain 04/08/2019  . Acute kidney injury ([place]) 04/07/2019  . MCI (mild cognitive impairment) 03/07/2019  . Congestive heart failure (CHF) ([place]) 01/20/2019  . E. coli UTI 07/21/2018  . Acute kidney injury superimposed on chronic kidney disease ([place]) 07/21/2018  . CAD (coronary artery disease) 06/07/2018  . Chronic diastolic (congestive) heart failure ([place]) 06/07/2018  . Pulmonary hypertension, primary ([place]) 06/07/2018  . Generalized weakness   . Abnormal LFTs 06/01/2018  . Hallucination 06/01/2018  . Abnormal chest x-ray 09/28/2015  . Edema of right lower extremity 09/28/2015  . Elevated brain natriuretic peptide (BNP) level 09/17/2015  . Seizures ([place]) 09/16/2015  . Asthma 08/14/2015  . PCP NOTES >>>>>>>>>>>>>>>>>>>>>>>>>>>>>> 04/24/2015  . TIA (transient ischemic attack) 12/11/2014  . Dyslipidemia 12/11/2014  . History of CVA (cerebrovascular accident) 12/01/2014  . Thrombocytopenia ([place]) 12/01/2014  . Chronic renal insufficiency, stage II (mild) 11/12/2013  . Impingement syndrome, shoulder, left 11/09/2013  . Acquired short bowel syndrome 11/09/2013  . Annual physical exam 08/30/2013  . Hx pulmonary embolism 06/27/2013  . Warfarin anticoagulation 06/27/2013  . DDD (degenerative disc disease), lumbar 11/18/2012  . Mitral regurgitation 08/26/2011  . DYSPHAGIA UNSPECIFIED 07/31/2010  . ATRIAL  FIBRILLATION   07/08/2010  . COLON CANCER, HX OF 07/08/2010  . DIZZINESS 10/18/2009  . Essential hypertension 12/16/2007  . HIP PAIN, CHRONIC 12/28/2006  . ANEMIA-NOS 07/15/2006  . Personal history of venous thrombosis and embolism 07/15/2006   PCP:  Colon Branch, MD Pharmacy:   [facility] [address], [place] [address] Phone: [phone] Fax: [phone]     Social Determinants of Health (SDOH) Interventions    Readmission Risk Interventions No flowsheet data found.

## 2019-04-11 NOTE — Progress Notes (Signed)
[facility]  PROGRESS NOTE  [name] [id number] DOB: 03-10-1927 DOA: 04/07/2019 PCP: Colon Branch, MD   Brief HPI:    85 year old female with history of atrial fibrillation, pulmonary embolism on Coumadin, hypertension, hyperlipidemia, history of CVA, CKD stage III, asthma, thrombocytopenia, history of colon cancer, mild cognitive impairment who came to the ED for evaluation of confusion and lower back pain.  Patient reported lower back pain difficulty walking.  Denied trauma or falls.  There is also question of confusion with foul-smelling urine.   Subjective   Patient seen and examined, denies back pain.  PT OT recommends rehab at skilled facility.   Assessment/Plan:    1. Acute kidney injury on CKD stage III-likely in setting of dehydration, poor p.o. intake and Lasix use.  Started on IV normal saline.  Creatinine has improved from 2.34-1.28 today.  Back to baseline.   2. UTI-urine culture growing Klebsiella pneumoniae.  Klebsiella is sensitive to ceftriaxone.  We will continue with antibiotics at this time.  3. Low back pain-patient has chronic degenerative disc disease.  X-ray of the lumbar spine showed no acute process.  PT OT has been consulted.  Patient was started on oxycodone 5 mg every 6 hours PRN.  Continue Tylenol PRN for pain.  4. Encephalopathy-resolved, patient has history of intermittent confusion.  UA was not convincing for UTI.  Back to baseline.  Will monitor.  Continue home Seroquel.  5. Permanent atrial fibrillation-EKG showed atrial fibrillation, heart rate is controlled.CHA2DS2VASc score is 8.  Patient is on anticoagulation with Coumadin.  Continue metoprolol, verapamil for rate control.  6. Chronic diastolic CHF-EF 60 to [id number] by TTE on 01/21/2019.  Lasix was initially held.  Now patient requiring oxygen.  IV fluids were discontinued.  Will restart Lasix at a lower dose of 40 mg p.o. daily.  7. History of CVA-continue aspirin,  atorvastatin.  8. Hypertension-blood pressure stable, continue metoprolol, verapamil.  9. Constipation-moderate stool burden noted on CT scan.  Started on Colace.  Continue MiraLAX 17 g daily.    CBC: Recent Labs  Lab 04/07/19 1530 04/08/19 0021  WBC 8.2 6.9  NEUTROABS 6.5  --   HGB 13.3 12.7  HCT 41.2 40.0  MCV 95.2 97.6  PLT 75* 76*    Basic Metabolic Panel: Recent Labs  Lab 04/07/19 1530 04/08/19 0021 04/09/19 0456 04/10/19 0557 04/11/19 0556  NA 143 144 147* 146* 148*  K 3.4* 3.5 3.9 4.3 4.3  CL 106 107 113* 112* 111  CO2 23 27 27 26 27   GLUCOSE 119* 114* 99 87 87  BUN 47* 41* 36* 35* 31*  CREATININE 2.34* 1.96* 1.62* 1.50* 1.28*  CALCIUM 9.9 9.2 8.8* 8.7* 9.0  MG  --  2.2  --   --   --   PHOS  --  4.0  --   --   --      DVT prophylaxis: Coumadin  Code Status: Full code  Family Communication: No family at bedside  Disposition Plan: likely home when medically ready for discharge Pressure Injury 04/08/19 Buttocks (Active)  04/08/19 0821  Location: Buttocks  Location Orientation:   Staging:   Wound Description (Comments):   Present on Admission:      BMI  Estimated body mass index is 32.02 kg/m as calculated from the following:   Height as of this encounter: 5\' 3"  (1.6 m).   Weight as of this encounter: 82 kg.  Scheduled medications:  . atorvastatin  20 mg Oral q1800  .  docusate sodium  100 mg Oral BID  . feeding supplement (ENSURE ENLIVE)  237 mL Oral BID BM  . isosorbide mononitrate  15 mg Oral Daily  . metoprolol tartrate  37.5 mg Oral BID  . mometasone-formoterol  2 puff Inhalation BID  . polyethylene glycol  17 g Oral Daily  . QUEtiapine  50 mg Oral QHS  . verapamil  120 mg Oral QHS  . Warfarin - Pharmacist Dosing Inpatient   Does not apply q1800    Consultants:    Procedures:     Antibiotics:   Anti-infectives (From admission, onward)   Start     Dose/Rate Route Frequency Ordered Stop   04/09/19 1200  cefTRIAXone  (ROCEPHIN) 1 g in sodium chloride 0.9 % 100 mL IVPB     1 g 200 mL/hr over 30 Minutes Intravenous Every 24 hours 04/09/19 1053     04/07/19 2130  cefTRIAXone (ROCEPHIN) 1 g in sodium chloride 0.9 % 100 mL IVPB     1 g 200 mL/hr over 30 Minutes Intravenous  Once 04/07/19 2126 04/08/19 0638       Objective   Vitals:   04/10/19 2001 04/10/19 2145 04/11/19 0607 04/11/19 0749  BP:  132/68 124/61   Pulse:  76 (!) 57   Resp:  16 15   Temp:  98.4 F (36.9 C) 97.8 F (36.6 C)   TempSrc:  Oral Oral   SpO2: 99% 98% 92% 98%  Weight:      Height:        Intake/Output Summary (Last 24 hours) at 04/11/2019 1133 Last data filed at 04/11/2019 0700 Gross per 24 hour  Intake -  Output 1050 ml  Net -1050 ml   Filed Weights   04/07/19 2340 04/08/19 0530 04/10/19 0523  Weight: 78.7 kg 78.7 kg 82 kg     Physical Examination:  General-appears in no acute distress Heart-S1-S2, regular, no murmur auscultated Lungs-clear to auscultation bilaterally, no wheezing or crackles auscultated Abdomen-soft, nontender, no organomegaly Extremities-no edema in the lower extremities Neuro-alert, oriented x3, no focal deficit noted    Data Reviewed: I have personally reviewed following labs and imaging studies   Recent Results (from the past 240 hour(s))  Urine culture     Status: Abnormal   Collection Time: 04/07/19 12:12 PM   Specimen: Urine, Random  Result Value Ref Range Status   Specimen Description   Final    URINE, RANDOM Performed at [facility], [place] [address], [place], [place] [address]    Special Requests   Final    NONE Performed at [facility], [place] [address], [place], [place] [address]    Culture >=100,000 COLONIES/mL KLEBSIELLA PNEUMONIAE (A)  Final   Report Status 04/10/2019 FINAL  Final   Organism ID, Bacteria KLEBSIELLA PNEUMONIAE (A)  Final      Susceptibility   Klebsiella pneumoniae - MIC*    AMPICILLIN >=32 RESISTANT Resistant      CEFAZOLIN <=4 SENSITIVE Sensitive     CEFTRIAXONE <=1 SENSITIVE Sensitive     CIPROFLOXACIN <=0.25 SENSITIVE Sensitive     GENTAMICIN <=1 SENSITIVE Sensitive     IMIPENEM 0.5 SENSITIVE Sensitive     NITROFURANTOIN 64 INTERMEDIATE Intermediate     TRIMETH/SULFA <=20 SENSITIVE Sensitive     AMPICILLIN/SULBACTAM 8 SENSITIVE Sensitive     PIP/TAZO <=4 SENSITIVE Sensitive     Extended ESBL NEGATIVE Sensitive     * >=100,000 COLONIES/mL KLEBSIELLA PNEUMONIAE  SARS Coronavirus 2 [facility] order,  Performed in [facility] hospital lab) Nasopharyngeal Nasopharyngeal Swab     Status: None   Collection Time: 04/07/19 10:30 PM   Specimen: Nasopharyngeal Swab  Result Value Ref Range Status   SARS Coronavirus 2 NEGATIVE NEGATIVE Final    Comment: (NOTE) If result is NEGATIVE SARS-CoV-2 target nucleic acids are NOT DETECTED. The SARS-CoV-2 RNA is generally detectable in upper and lower  respiratory specimens during the acute phase of infection. The lowest  concentration of SARS-CoV-2 viral copies this assay can detect is 250  copies / mL. A negative result does not preclude SARS-CoV-2 infection  and should not be used as the sole basis for treatment or other  patient management decisions.  A negative result may occur with  improper specimen collection / handling, submission of specimen other  than nasopharyngeal swab, presence of viral mutation(s) within the  areas targeted by this assay, and inadequate number of viral copies  (<250 copies / mL). A negative result must be combined with clinical  observations, patient history, and epidemiological information. If result is POSITIVE SARS-CoV-2 target nucleic acids are DETECTED. The SARS-CoV-2 RNA is generally detectable in upper and lower  respiratory specimens dur ing the acute phase of infection.  Positive  results are indicative of active infection with SARS-CoV-2.  Clinical  correlation with patient history and other diagnostic information  is  necessary to determine patient infection status.  Positive results do  not rule out bacterial infection or co-infection with other viruses. If result is PRESUMPTIVE POSTIVE SARS-CoV-2 nucleic acids MAY BE PRESENT.   A presumptive positive result was obtained on the submitted specimen  and confirmed on repeat testing.  While 2019 novel coronavirus  (SARS-CoV-2) nucleic acids may be present in the submitted sample  additional confirmatory testing may be necessary for epidemiological  and / or clinical management purposes  to differentiate between  SARS-CoV-2 and other Sarbecovirus currently known to infect humans.  If clinically indicated additional testing with an alternate test  methodology [id number]) is advised. The SARS-CoV-2 RNA is generally  detectable in upper and lower respiratory sp ecimens during the acute  phase of infection. The expected result is Negative. Fact Sheet for Patients:  StrictlyIdeas.no Fact Sheet for Healthcare Providers: BankingDealers.co.za This test is not yet approved or cleared by the [place] FDA and has been authorized for detection and/or diagnosis of SARS-CoV-2 by FDA under an Emergency Use Authorization (EUA).  This EUA will remain in effect (meaning this test can be used) for the duration of the COVID-19 declaration under Section 564(b)(1) of the Act, 21 U.S.C. section 360bbb-3(b)(1), unless the authorization is terminated or revoked sooner. Performed at [facility], [place] [address], [place], [place] [address]      Liver Function Tests: Recent Labs  Lab 04/07/19 1530  AST 31  ALT 15  ALKPHOS 56  BILITOT 1.4*  PROT 6.9  ALBUMIN 4.7   BNP (last 3 results) Recent Labs    01/20/19 0941  BNP 852.0*     Admission status: Inpatient: Based on patients clinical presentation and evaluation of above clinical data, I have made determination that patient meets Inpatient  criteria at this time.  Time spent: 20 min  [facility] Pager [phone]. If 7PM-7AM, please contact night-coverage at [email or web address], Office  [phone]  password [email or web address]  04/11/2019, 11:33 AM  LOS: 3 days

## 2019-04-11 NOTE — Progress Notes (Signed)
ANTICOAGULATION CONSULT NOTE - Follow Up Consult  Pharmacy Consult for warfarin Indication: hx atrial fibrillation  No Known Allergies  Patient Measurements: Height: 5\' 3"  (160 cm) Weight: 180 lb 12.4 oz (82 kg) IBW/kg (Calculated) : 52.4 Heparin Dosing Weight:   Vital Signs: Temp: 97.8 F (36.6 C) (10/06 0607) Temp Source: Oral (10/06 0607) BP: 124/61 (10/06 0607) Pulse Rate: 57 (10/06 0607)  Labs: Recent Labs    04/09/19 0456 04/09/19 0542 04/10/19 0557 04/11/19 0556  LABPROT  --  36.5* 29.1* 26.2*  INR  --  3.8* 2.8* 2.5*  CREATININE 1.62*  --  1.50* 1.28*    Estimated Creatinine Clearance: 29.6 mL/min (A) (by C-G formula based on SCr of 1.28 mg/dL (H)).   Medications:  - PTA warfarin regimen: 2.5 mg daily (last dose taken on 10/1)  Assessment: Patient is a 85 y.o F with hx afib on warfarin PTA, presented to the ED on 10/2 with c/o AMS, back and hip pain.  Warfarin resumed on admission.  Today, 04/11/2019: - INR 2.5 (decreased, but remains within therapeutic range) (warfarin doses held 10/2-10/4 due to supra- therapeutic INR) - hgb stable; plts low but stable at 76 (10/3) (was 75 in July 2020)  - no bleeding documented - no significant drug-drug intxns  Goal of Therapy:  INR 2-3 Monitor platelets by anticoagulation protocol: Yes   Plan:  -Warfarin 2.5mg  po x 1 dose today - Daily INR - monitor for s/s bleeding  Minda Ditto, PharmD 04/11/2019,7:47 AM

## 2019-04-11 NOTE — Care Management Important Message (Signed)
Important Message  Patient Details IM Letter given to Prudy Feeler SW to present to the Patient Name: [name] MRN: [id number] Date of Birth: [birth year]   Medicare Important Message Given:  Yes     Kerin Salen 04/11/2019, 9:45 AM

## 2019-04-11 NOTE — Progress Notes (Signed)
Pt c/o right hip pain radiating to groin.  Distended abd noted.  Pt states she hasn't had a BM in over a week.  Fleets enema given as ordered.  Pt had to be disimpacted some and pt able to push rest of large, hard stool out.  Pt stated she felt much better.

## 2019-04-12 ENCOUNTER — Inpatient Hospital Stay (HOSPITAL_COMMUNITY): Payer: Medicare Other

## 2019-04-12 DIAGNOSIS — I5032 Chronic diastolic (congestive) heart failure: Secondary | ICD-10-CM

## 2019-04-12 DIAGNOSIS — E870 Hyperosmolality and hypernatremia: Secondary | ICD-10-CM

## 2019-04-12 DIAGNOSIS — G8929 Other chronic pain: Secondary | ICD-10-CM

## 2019-04-12 LAB — CBC
HCT: 42 % (ref 36.0–46.0)
Hemoglobin: 12.9 g/dL (ref 12.0–15.0)
MCH: 30.7 pg (ref 26.0–34.0)
MCHC: 30.7 g/dL (ref 30.0–36.0)
MCV: 100 fL (ref 80.0–100.0)
Platelets: 70 10*3/uL — ABNORMAL LOW (ref 150–400)
RBC: 4.2 MIL/uL (ref 3.87–5.11)
RDW: 15.2 % (ref 11.5–15.5)
WBC: 5.3 10*3/uL (ref 4.0–10.5)
nRBC: 0 % (ref 0.0–0.2)

## 2019-04-12 LAB — BASIC METABOLIC PANEL
Anion gap: 14 (ref 5–15)
BUN: 29 mg/dL — ABNORMAL HIGH (ref 8–23)
CO2: 25 mmol/L (ref 22–32)
Calcium: 9.3 mg/dL (ref 8.9–10.3)
Chloride: 108 mmol/L (ref 98–111)
Creatinine, Ser: 1.26 mg/dL — ABNORMAL HIGH (ref 0.44–1.00)
GFR calc Af Amer: 43 mL/min — ABNORMAL LOW (ref >60)
GFR calc non Af Amer: 37 mL/min — ABNORMAL LOW (ref >60)
Glucose, Bld: 93 mg/dL (ref 70–99)
Potassium: 4.1 mmol/L (ref 3.5–5.1)
Sodium: 147 mmol/L — ABNORMAL HIGH (ref 135–145)

## 2019-04-12 LAB — PROTIME-INR
INR: 2.5 — ABNORMAL HIGH (ref 0.8–1.2)
Prothrombin Time: 26.2 seconds — ABNORMAL HIGH (ref 11.4–15.2)

## 2019-04-12 MED ORDER — TRAMADOL HCL 50 MG PO TABS
50.0000 mg | ORAL_TABLET | Freq: Two times a day (BID) | ORAL | Status: DC | PRN
  Administered 2019-04-12 – 2019-04-17 (×7): 50 mg via ORAL
  Filled 2019-04-12 (×9): qty 1

## 2019-04-12 MED ORDER — WARFARIN SODIUM 2.5 MG PO TABS
2.5000 mg | ORAL_TABLET | Freq: Once | ORAL | Status: AC
  Administered 2019-04-12: 2.5 mg via ORAL
  Filled 2019-04-12: qty 1

## 2019-04-12 NOTE — Progress Notes (Signed)
PROGRESS NOTE    [name]  [id number]  DOB: September 21, 1926  DOA: 04/07/2019 PCP: Colon Branch, MD  Brief Narrative:  85 year old female with history of atrial fibrillation, pulmonary embolism on Coumadin, hypertension, hyperlipidemia, history of CVA, CKD stage III, asthma, thrombocytopenia, history of colon cancer, mild cognitive impairment who came to the ED for evaluation of confusion and lower back pain.  Patient reported lower back pain difficulty walking.  Denied trauma or falls. Noted to have foul-smelling urine,admitted with empiric antibiotics for UTI-urine culture growing Klebsiella pneumoniae sensitive to ceftriaxone. X-ray of the lumbar spine showed no acute process, chronic degenerative disc disease.  PT/OT  consulted, started on oxycodone 5 mg every 6 hours PRN. Noted to have moderate stool burden on CT scan- on laxatives.  Subjective:  Patient being assisted by nurse tech for bedside care when seen during rounds.  Denies any acute complaints.  Afebrile.  States is walker dependent at baseline and able to perform some basic ADLs but currently feels weak and unable to get out of bed..  Objective: Vitals:   04/11/19 2023 04/12/19 0452 04/12/19 0500 04/12/19 0739  BP: (!) 154/86 (!) 123/49    Pulse: 95 (!) 55    Resp: 20 16    Temp: 98 F (36.7 C) (!) 97.5 F (36.4 C)    TempSrc: Oral Oral    SpO2: 90% 93%  92%  Weight:   81.2 kg   Height:        Intake/Output Summary (Last 24 hours) at 04/12/2019 0908 Last data filed at 04/11/2019 2030 Gross per 24 hour  Intake 1150 ml  Output 401 ml  Net 749 ml   Filed Weights   04/08/19 0530 04/10/19 0523 04/12/19 0500  Weight: 78.7 kg 82 kg 81.2 kg    Physical Examination:  General exam: Appears calm and comfortable  Respiratory system: Clear to auscultation. Respiratory effort normal. Cardiovascular system: S1 & S2 heard, RRR. No JVD, murmurs, rubs, gallops or clicks. No pedal edema. Gastrointestinal system: Abdomen  is nondistended, soft and nontender. No organomegaly or masses felt. Normal bowel sounds heard. Central nervous system: Alert and oriented. No focal neurological deficits. Extremities: Symmetric 4 x 5 power.  No edema Skin: No rashes, lesions or ulcers Psychiatry: Judgement and insight appear normal. Mood & affect appropriate.     Data Reviewed: I have personally reviewed following labs and imaging studies  CBC: Recent Labs  Lab 04/07/19 1530 04/08/19 0021 04/12/19 0536  WBC 8.2 6.9 5.3  NEUTROABS 6.5  --   --   HGB 13.3 12.7 12.9  HCT 41.2 40.0 42.0  MCV 95.2 97.6 100.0  PLT 75* 76* 70*   Basic Metabolic Panel: Recent Labs  Lab 04/08/19 0021 04/09/19 0456 04/10/19 0557 04/11/19 0556 04/12/19 0536  NA 144 147* 146* 148* 147*  K 3.5 3.9 4.3 4.3 4.1  CL 107 113* 112* 111 108  CO2 27 27 26 27 25   GLUCOSE 114* 99 87 87 93  BUN 41* 36* 35* 31* 29*  CREATININE 1.96* 1.62* 1.50* 1.28* 1.26*  CALCIUM 9.2 8.8* 8.7* 9.0 9.3  MG 2.2  --   --   --   --   PHOS 4.0  --   --   --   --    GFR: Estimated Creatinine Clearance: 29.9 mL/min (A) (by C-G formula based on SCr of 1.26 mg/dL (H)). Liver Function Tests: Recent Labs  Lab 04/07/19 1530  AST 31  ALT 15  ALKPHOS 56  BILITOT 1.4*  PROT 6.9  ALBUMIN 4.7   No results for input(s): LIPASE, AMYLASE in the last 168 hours. No results for input(s): AMMONIA in the last 168 hours. Coagulation Profile: Recent Labs  Lab 04/08/19 0021 04/09/19 0542 04/10/19 0557 04/11/19 0556 04/12/19 0536  INR 3.6* 3.8* 2.8* 2.5* 2.5*   Cardiac Enzymes: No results for input(s): CKTOTAL, CKMB, CKMBINDEX, TROPONINI in the last 168 hours. BNP (last 3 results) No results for input(s): PROBNP in the last 8760 hours. HbA1C: No results for input(s): HGBA1C in the last 72 hours. CBG: No results for input(s): GLUCAP in the last 168 hours. Lipid Profile: No results for input(s): CHOL, HDL, LDLCALC, TRIG, CHOLHDL, LDLDIRECT in the last 72  hours. Thyroid Function Tests: No results for input(s): TSH, T4TOTAL, FREET4, T3FREE, THYROIDAB in the last 72 hours. Anemia Panel: No results for input(s): VITAMINB12, FOLATE, FERRITIN, TIBC, IRON, RETICCTPCT in the last 72 hours. Sepsis Labs: No results for input(s): PROCALCITON, LATICACIDVEN in the last 168 hours.  Recent Results (from the past 240 hour(s))  Urine culture     Status: Abnormal   Collection Time: 04/07/19 12:12 PM   Specimen: Urine, Random  Result Value Ref Range Status   Specimen Description   Final    URINE, RANDOM Performed at [facility] [address] [place], [place] [address]    Special Requests   Final    NONE Performed at [facility], [place] [address], [place], [place] [address]    Culture >=100,000 COLONIES/mL KLEBSIELLA PNEUMONIAE (A)  Final   Report Status 04/10/2019 FINAL  Final   Organism ID, Bacteria KLEBSIELLA PNEUMONIAE (A)  Final      Susceptibility   Klebsiella pneumoniae - MIC*    AMPICILLIN >=32 RESISTANT Resistant     CEFAZOLIN <=4 SENSITIVE Sensitive     CEFTRIAXONE <=1 SENSITIVE Sensitive     CIPROFLOXACIN <=0.25 SENSITIVE Sensitive     GENTAMICIN <=1 SENSITIVE Sensitive     IMIPENEM 0.5 SENSITIVE Sensitive     NITROFURANTOIN 64 INTERMEDIATE Intermediate     TRIMETH/SULFA <=20 SENSITIVE Sensitive     AMPICILLIN/SULBACTAM 8 SENSITIVE Sensitive     PIP/TAZO <=4 SENSITIVE Sensitive     Extended ESBL NEGATIVE Sensitive     * >=100,000 COLONIES/mL KLEBSIELLA PNEUMONIAE  SARS Coronavirus 2 [facility] order, Performed in [facility] hospital lab) Nasopharyngeal Nasopharyngeal Swab     Status: None   Collection Time: 04/07/19 10:30 PM   Specimen: Nasopharyngeal Swab  Result Value Ref Range Status   SARS Coronavirus 2 NEGATIVE NEGATIVE Final    Comment: (NOTE) If result is NEGATIVE SARS-CoV-2 target nucleic acids are NOT DETECTED. The SARS-CoV-2 RNA is generally detectable in upper and lower   respiratory specimens during the acute phase of infection. The lowest  concentration of SARS-CoV-2 viral copies this assay can detect is 250  copies / mL. A negative result does not preclude SARS-CoV-2 infection  and should not be used as the sole basis for treatment or other  patient management decisions.  A negative result may occur with  improper specimen collection / handling, submission of specimen other  than nasopharyngeal swab, presence of viral mutation(s) within the  areas targeted by this assay, and inadequate number of viral copies  (<250 copies / mL). A negative result must be combined with clinical  observations, patient history, and epidemiological information. If result is POSITIVE SARS-CoV-2 target nucleic acids are DETECTED. The SARS-CoV-2 RNA is generally detectable in upper and lower  respiratory specimens dur ing the acute phase of infection.  Positive  results are indicative of active infection with SARS-CoV-2.  Clinical  correlation with patient history and other diagnostic information is  necessary to determine patient infection status.  Positive results do  not rule out bacterial infection or co-infection with other viruses. If result is PRESUMPTIVE POSTIVE SARS-CoV-2 nucleic acids MAY BE PRESENT.   A presumptive positive result was obtained on the submitted specimen  and confirmed on repeat testing.  While 2019 novel coronavirus  (SARS-CoV-2) nucleic acids may be present in the submitted sample  additional confirmatory testing may be necessary for epidemiological  and / or clinical management purposes  to differentiate between  SARS-CoV-2 and other Sarbecovirus currently known to infect humans.  If clinically indicated additional testing with an alternate test  methodology [phone]) is advised. The SARS-CoV-2 RNA is generally  detectable in upper and lower respiratory sp ecimens during the acute  phase of infection. The expected result is Negative. Fact  Sheet for Patients:  StrictlyIdeas.no Fact Sheet for Healthcare Providers: BankingDealers.co.za This test is not yet approved or cleared by the [place] FDA and has been authorized for detection and/or diagnosis of SARS-CoV-2 by FDA under an Emergency Use Authorization (EUA).  This EUA will remain in effect (meaning this test can be used) for the duration of the COVID-19 declaration under Section 564(b)(1) of the Act, 21 U.S.C. section 360bbb-3(b)(1), unless the authorization is terminated or revoked sooner. Performed at [facility], [place] [address], [place], [place] [address]       Radiology Studies: No results found.      Scheduled Meds: . atorvastatin  20 mg Oral q1800  . docusate sodium  100 mg Oral BID  . feeding supplement (ENSURE ENLIVE)  237 mL Oral BID BM  . furosemide  40 mg Oral Daily  . isosorbide mononitrate  15 mg Oral Daily  . metoprolol tartrate  37.5 mg Oral BID  . mometasone-formoterol  2 puff Inhalation BID  . polyethylene glycol  17 g Oral Daily  . QUEtiapine  50 mg Oral QHS  . verapamil  120 mg Oral QHS  . Warfarin - Pharmacist Dosing Inpatient   Does not apply q1800   Continuous Infusions: . cefTRIAXone (ROCEPHIN)  IV 1 g (04/11/19 1202)    Assessment & Plan:    1.  Klebsiella UTI with metabolic encephalopathy: Present on admission.  On day 5 of antibiotics.  Can DC in a.m.  Afebrile with no leukocytosis.  2.  AKI on CKD stage III: Patient's baseline creatinine appears to be 1.3-1.5.  On admission serum creatinine was 2.34 and now improved to 1.2 fluids.  3.  Chronic hypernatremia: Patient appears to have had baseline sodium levels around 140-148 since 2017.  On admission her sodium level was 143 and now at 147.  Patient did receive normal saline during hospital course and resumed on Lasix at 40 mg yesterday (10/6).  Will hold Lasix and encourage free water intake for 24 hours.   4.  Chronic diastolic CHF:EF 60 to [id number] by TTE on 01/21/2019.  Lasix was initially held but resumed again as there was concern for hypoxia/CHF exacerbation as patient was requiring oxygen.  Will obtain chest x-ray to assess pulmonary edema and  resume Lasix at a lower dose of 40 mg p.o. daily if needed.   5.  Chronic low back pain: Patient has chronic DDD.  X-ray of the lumbar spine showed no acute process.  Continue  Tylenol as needed, also started on oxycodone as needed while here. Cautious with opiates in elderly patient with underlying dementia as well as problem #7.  Per review of MAR receiving oxycodone about once a day.  Will try tramadol  6. Permanent atrial fibrillation-EKG showed atrial fibrillation, heart rate is controlled.CHA2DS2VASc score is 8.  Patient is on anticoagulation with Coumadin.  Continue metoprolol, verapamil for rate control.  7. Constipation-moderate stool burden noted on CT scan.  Started on Colace.  Continue MiraLAX 17 g daily.  8. Hypertension-blood pressure stable, continue metoprolol, verapamil.   9. History of CVA-continue aspirin, atorvastatin.   DVT prophylaxis: Warfarin Code Status: Full code Family / Patient Communication: Family not present bedside.  Discussed with patient Disposition Plan: Awaiting rehab placement at [facility] per family request.  Covid testing sent on 10/6     LOS: 4 days    Time spent: 77 minutes    Guilford Shi, MD [facility] Pager [phone]  If 7PM-7AM, please contact night-coverage [email or web address] Password [email or web address] 04/12/2019, 9:08 AM

## 2019-04-12 NOTE — Progress Notes (Signed)
ANTICOAGULATION CONSULT NOTE - Follow Up Consult  Pharmacy Consult for warfarin Indication: hx atrial fibrillation  No Known Allergies  Patient Measurements: Height: 5\' 3"  (160 cm) Weight: 179 lb 0.2 oz (81.2 kg) IBW/kg (Calculated) : 52.4 Heparin Dosing Weight:   Vital Signs: Temp: 97.5 F (36.4 C) (10/07 0452) Temp Source: Oral (10/07 0452) BP: 118/58 (10/07 0949) Pulse Rate: 65 (10/07 0949)  Labs: Recent Labs    04/10/19 0557 04/11/19 0556 04/12/19 0536  HGB  --   --  12.9  HCT  --   --  42.0  PLT  --   --  70*  LABPROT 29.1* 26.2* 26.2*  INR 2.8* 2.5* 2.5*  CREATININE 1.50* 1.28* 1.26*    Estimated Creatinine Clearance: 29.9 mL/min (A) (by C-G formula based on SCr of 1.26 mg/dL (H)).   Medications:  - PTA warfarin regimen: 2.5 mg daily (last dose taken on 10/1)  Assessment: Patient is a 85 y.o F with hx afib on warfarin PTA, presented to the ED on 10/2 with c/o AMS, back and hip pain.  Warfarin resumed on admission.  Today, 04/12/2019: - INR 2.5 (decreased, but remains within therapeutic range) (warfarin doses held 10/2-10/4 due to supra- therapeutic INR) - hgb stable; plts low but stable at 70 (10/7) (was 75 in July 2020)  - no bleeding documented - no significant drug-drug intxns  Goal of Therapy:  INR 2-3 Monitor platelets by anticoagulation protocol: Yes   Plan:  -Warfarin 2.5mg  po x 1 dose today - Daily INR - monitor for s/s bleeding  Minda Ditto, PharmD 04/12/2019,12:46 PM

## 2019-04-13 LAB — PROTIME-INR
INR: 2.7 — ABNORMAL HIGH (ref 0.8–1.2)
Prothrombin Time: 28.1 seconds — ABNORMAL HIGH (ref 11.4–15.2)

## 2019-04-13 MED ORDER — WARFARIN SODIUM 2 MG PO TABS
2.0000 mg | ORAL_TABLET | Freq: Once | ORAL | Status: AC
  Administered 2019-04-14: 2 mg via ORAL
  Filled 2019-04-13: qty 1

## 2019-04-13 NOTE — Progress Notes (Signed)
Nutrition Follow-up  DOCUMENTATION CODES:   Obesity unspecified  INTERVENTION:   -Ensure Enlive po BID, each supplement provides 350 kcal and 20 grams of protein  NUTRITION DIAGNOSIS:   Inadequate oral intake related to poor appetite as evidenced by per patient/family report.  Ongoing.  GOAL:   Patient will meet greater than or equal to 90% of their needs  Progressing.  MONITOR:   PO intake, Supplement acceptance, Labs, Weight trends, I & O's  ASSESSMENT:   85 year old female with history of atrial fibrillation, pulmonary embolism on Coumadin, hypertension, hyperlipidemia, history of CVA, CKD stage III, asthma, thrombocytopenia, history of colon cancer, mild cognitive impairment who came to the ED for evaluation of confusion and lower back pain. Admitted for AKI.  **RD working remotely**  Patient currently consuming 10-25% of meals at this time. Pt is drinking Ensure supplements.   Admission weight: 170 lbs. Current weight: 179 lbs. I/Os: +443 ml UOP 10/7: 1050 ml  Medications reviewed. Labs reviewed: Elevated Na  Diet Order:   Diet Order            Diet Heart Room service appropriate? Yes; Fluid consistency: Thin  Diet effective now              EDUCATION NEEDS:   No education needs have been identified at this time  Skin:  Skin Assessment: Skin Integrity Issues: Skin Integrity Issues:: Other (Comment) Other: Pressure injury on buttocks  Last BM:  9/30  Height:   Ht Readings from Last 1 Encounters:  04/07/19 5\' 3"  (1.6 m)    Weight:   Wt Readings from Last 1 Encounters:  04/12/19 81.2 kg    Ideal Body Weight:  52.3 kg  BMI:  Body mass index is 31.71 kg/m.  Estimated Nutritional Needs:   Kcal:  1450-1650  Protein:  60-70g  Fluid:  1.5L/day  Clayton Bibles, MS, RD, LDN Inpatient Clinical Dietitian Pager: [phone] After Hours Pager: [phone]

## 2019-04-13 NOTE — Progress Notes (Signed)
PROGRESS NOTE    [name]  [id number]  DOB: 09-29-1926  DOA: 04/07/2019 PCP: Colon Branch, MD  Brief Narrative:  85 year old female with history of atrial fibrillation, pulmonary embolism on Coumadin, hypertension, hyperlipidemia, history of CVA, CKD stage III, asthma, thrombocytopenia, history of colon cancer, mild cognitive impairment who came to the ED for evaluation of confusion and lower back pain.  Patient reported lower back pain difficulty walking.  Denied trauma or falls. Noted to have foul-smelling urine,admitted with empiric antibiotics for UTI-urine culture growing Klebsiella pneumoniae sensitive to ceftriaxone. X-ray of the lumbar spine showed no acute process, chronic degenerative disc disease.  PT/OT  consulted, started on oxycodone 5 mg every 6 hours PRN. Noted to have moderate stool burden on CT scan- on laxatives.  Subjective:  Denies any acute complaints.  Afebrile.  States feels good overall.  Still on 2 L nasal cannula, saturating well.  Reports history of asthma but denies any history of smoking  Objective: Vitals:   04/12/19 1953 04/13/19 0618 04/13/19 0829 04/13/19 1442  BP:  136/62  (!) 138/95  Pulse:  68  79  Resp:  17    Temp:  97.7 F (36.5 C)  98 F (36.7 C)  TempSrc:  Oral  Oral  SpO2: 90% 96% 98% 98%  Weight:      Height:        Intake/Output Summary (Last 24 hours) at 04/13/2019 1629 Last data filed at 04/13/2019 1457 Gross per 24 hour  Intake 638 ml  Output 1225 ml  Net -587 ml   Filed Weights   04/08/19 0530 04/10/19 0523 04/12/19 0500  Weight: 78.7 kg 82 kg 81.2 kg    Physical Examination:  General exam: Appears calm and comfortable  Respiratory system: Clear to auscultation. Respiratory effort normal. Cardiovascular system: S1 & S2 heard, RRR. No JVD, murmurs, rubs, gallops or clicks. No pedal edema. Gastrointestinal system: Abdomen is nondistended, soft and nontender. No organomegaly or masses felt. Normal bowel sounds heard.  Central nervous system: Alert and oriented. No focal neurological deficits. Extremities: Symmetric 4 x 5 power.  No edema Skin: No rashes, lesions or ulcers Psychiatry: Judgement and insight appear normal. Mood & affect appropriate.     Data Reviewed: I have personally reviewed following labs and imaging studies  CBC: Recent Labs  Lab 04/07/19 1530 04/08/19 0021 04/12/19 0536  WBC 8.2 6.9 5.3  NEUTROABS 6.5  --   --   HGB 13.3 12.7 12.9  HCT 41.2 40.0 42.0  MCV 95.2 97.6 100.0  PLT 75* 76* 70*   Basic Metabolic Panel: Recent Labs  Lab 04/08/19 0021 04/09/19 0456 04/10/19 0557 04/11/19 0556 04/12/19 0536  NA 144 147* 146* 148* 147*  K 3.5 3.9 4.3 4.3 4.1  CL 107 113* 112* 111 108  CO2 27 27 26 27 25   GLUCOSE 114* 99 87 87 93  BUN 41* 36* 35* 31* 29*  CREATININE 1.96* 1.62* 1.50* 1.28* 1.26*  CALCIUM 9.2 8.8* 8.7* 9.0 9.3  MG 2.2  --   --   --   --   PHOS 4.0  --   --   --   --    GFR: Estimated Creatinine Clearance: 29.9 mL/min (A) (by C-G formula based on SCr of 1.26 mg/dL (H)). Liver Function Tests: Recent Labs  Lab 04/07/19 1530  AST 31  ALT 15  ALKPHOS 56  BILITOT 1.4*  PROT 6.9  ALBUMIN 4.7   No results for input(s): LIPASE, AMYLASE in  the last 168 hours. No results for input(s): AMMONIA in the last 168 hours. Coagulation Profile: Recent Labs  Lab 04/09/19 0542 04/10/19 0557 04/11/19 0556 04/12/19 0536 04/13/19 0539  INR 3.8* 2.8* 2.5* 2.5* 2.7*   Cardiac Enzymes: No results for input(s): CKTOTAL, CKMB, CKMBINDEX, TROPONINI in the last 168 hours. BNP (last 3 results) No results for input(s): PROBNP in the last 8760 hours. HbA1C: No results for input(s): HGBA1C in the last 72 hours. CBG: No results for input(s): GLUCAP in the last 168 hours. Lipid Profile: No results for input(s): CHOL, HDL, LDLCALC, TRIG, CHOLHDL, LDLDIRECT in the last 72 hours. Thyroid Function Tests: No results for input(s): TSH, T4TOTAL, FREET4, T3FREE, THYROIDAB in  the last 72 hours. Anemia Panel: No results for input(s): VITAMINB12, FOLATE, FERRITIN, TIBC, IRON, RETICCTPCT in the last 72 hours. Sepsis Labs: No results for input(s): PROCALCITON, LATICACIDVEN in the last 168 hours.  Recent Results (from the past 240 hour(s))  Urine culture     Status: Abnormal   Collection Time: 04/07/19 12:12 PM   Specimen: Urine, Random  Result Value Ref Range Status   Specimen Description   Final    URINE, RANDOM Performed at [facility] [address] [place], [place] [address]    Special Requests   Final    NONE Performed at [facility], [place] [address], [place], [place] [address]    Culture >=100,000 COLONIES/mL KLEBSIELLA PNEUMONIAE (A)  Final   Report Status 04/10/2019 FINAL  Final   Organism ID, Bacteria KLEBSIELLA PNEUMONIAE (A)  Final      Susceptibility   Klebsiella pneumoniae - MIC*    AMPICILLIN >=32 RESISTANT Resistant     CEFAZOLIN <=4 SENSITIVE Sensitive     CEFTRIAXONE <=1 SENSITIVE Sensitive     CIPROFLOXACIN <=0.25 SENSITIVE Sensitive     GENTAMICIN <=1 SENSITIVE Sensitive     IMIPENEM 0.5 SENSITIVE Sensitive     NITROFURANTOIN 64 INTERMEDIATE Intermediate     TRIMETH/SULFA <=20 SENSITIVE Sensitive     AMPICILLIN/SULBACTAM 8 SENSITIVE Sensitive     PIP/TAZO <=4 SENSITIVE Sensitive     Extended ESBL NEGATIVE Sensitive     * >=100,000 COLONIES/mL KLEBSIELLA PNEUMONIAE  SARS Coronavirus 2 [facility] order, Performed in [facility] hospital lab) Nasopharyngeal Nasopharyngeal Swab     Status: None   Collection Time: 04/07/19 10:30 PM   Specimen: Nasopharyngeal Swab  Result Value Ref Range Status   SARS Coronavirus 2 NEGATIVE NEGATIVE Final    Comment: (NOTE) If result is NEGATIVE SARS-CoV-2 target nucleic acids are NOT DETECTED. The SARS-CoV-2 RNA is generally detectable in upper and lower  respiratory specimens during the acute phase of infection. The lowest  concentration of SARS-CoV-2 viral  copies this assay can detect is 250  copies / mL. A negative result does not preclude SARS-CoV-2 infection  and should not be used as the sole basis for treatment or other  patient management decisions.  A negative result may occur with  improper specimen collection / handling, submission of specimen other  than nasopharyngeal swab, presence of viral mutation(s) within the  areas targeted by this assay, and inadequate number of viral copies  (<250 copies / mL). A negative result must be combined with clinical  observations, patient history, and epidemiological information. If result is POSITIVE SARS-CoV-2 target nucleic acids are DETECTED. The SARS-CoV-2 RNA is generally detectable in upper and lower  respiratory specimens dur ing the acute phase of infection.  Positive  results are indicative of  active infection with SARS-CoV-2.  Clinical  correlation with patient history and other diagnostic information is  necessary to determine patient infection status.  Positive results do  not rule out bacterial infection or co-infection with other viruses. If result is PRESUMPTIVE POSTIVE SARS-CoV-2 nucleic acids MAY BE PRESENT.   A presumptive positive result was obtained on the submitted specimen  and confirmed on repeat testing.  While 2019 novel coronavirus  (SARS-CoV-2) nucleic acids may be present in the submitted sample  additional confirmatory testing may be necessary for epidemiological  and / or clinical management purposes  to differentiate between  SARS-CoV-2 and other Sarbecovirus currently known to infect humans.  If clinically indicated additional testing with an alternate test  methodology [id number]) is advised. The SARS-CoV-2 RNA is generally  detectable in upper and lower respiratory sp ecimens during the acute  phase of infection. The expected result is Negative. Fact Sheet for Patients:  StrictlyIdeas.no Fact Sheet for Healthcare Providers:  BankingDealers.co.za This test is not yet approved or cleared by the [place] FDA and has been authorized for detection and/or diagnosis of SARS-CoV-2 by FDA under an Emergency Use Authorization (EUA).  This EUA will remain in effect (meaning this test can be used) for the duration of the COVID-19 declaration under Section 564(b)(1) of the Act, 21 U.S.C. section 360bbb-3(b)(1), unless the authorization is terminated or revoked sooner. Performed at [facility], [place] [address], [place], [place] [address]       Radiology Studies: Dg Chest Port 1 View  Result Date: 04/12/2019 CLINICAL DATA:  Atrial fibrillation EXAM: PORTABLE CHEST 1 VIEW COMPARISON:  01/20/2019 FINDINGS: Cardiac shadow is enlarged but stable. Aortic calcifications are again seen. The lungs are well aerated bilaterally. No focal infiltrate or sizable effusion is seen. No bony abnormality is noted. IMPRESSION: No acute abnormality noted. Electronically Signed   By: Inez Catalina M.D.   On: 04/12/2019 20:14        Scheduled Meds: . atorvastatin  20 mg Oral q1800  . docusate sodium  100 mg Oral BID  . feeding supplement (ENSURE ENLIVE)  237 mL Oral BID BM  . isosorbide mononitrate  15 mg Oral Daily  . metoprolol tartrate  37.5 mg Oral BID  . mometasone-formoterol  2 puff Inhalation BID  . polyethylene glycol  17 g Oral Daily  . QUEtiapine  50 mg Oral QHS  . verapamil  120 mg Oral QHS  . warfarin  2 mg Oral ONCE-1800  . Warfarin - Pharmacist Dosing Inpatient   Does not apply q1800   Continuous Infusions:   Assessment & Plan:    1.  Klebsiella UTI with metabolic encephalopathy: Present on admission.  S/p 5 of antibiotics-Rocephin.  Afebrile with no leukocytosis.  2.  AKI on CKD stage III: Patient's baseline creatinine appears to be 1.3-1.5.  On admission serum creatinine was 2.34 and now improved to 1.26 with fluids.  3.  Chronic hypernatremia: Patient appears to  have had baseline sodium levels around 140-148 since 2017.  On admission her sodium level was 143 and now at 147.  Patient did receive normal saline during hospital course and resumed on Lasix at 40 mg on 10/6.  holding Lasix and encourage free water intake for 24 hours.  4.  Chronic diastolic CHF:EF 60 to [id number] by TTE on 01/21/2019.  Lasix was initially held but resumed again as there was concern for hypoxia/CHF exacerbation as patient was requiring oxygen.  Will obtain chest x-ray to assess pulmonary edema  and  resume Lasix at a lower dose of 40 mg p.o. daily if needed.  Check BNP.  Chest x-ray   5.  Chronic low back pain: Patient has chronic DDD.  X-ray of the lumbar spine showed no acute process.  Continue Tylenol as needed, also started on oxycodone as needed while here. Cautious with opiates in elderly patient with underlying dementia as well as problem #7.  Per review of MAR receiving oxycodone about once a day-changed to tramadol  6. Permanent atrial fibrillation-EKG showed atrial fibrillation, heart rate is controlled.CHA2DS2VASc score is 8.  Patient is on anticoagulation with Coumadin.  Continue metoprolol, verapamil for rate control.  7. Acute versus chronic hypoxic respiratory failure with underlying asthma/chronic diastolic CHF: Patient requiring 2 L of O2 via nasal cannula here.  Denies history of smoking or COPD.  She does have a history of asthma.  No wheezing on exam currently.  Chest x-ray without any infiltrates or pulmonary edema or pleural effusion..  Doubt PE as patient on anticoagulation with therapeutic INR.?  Sleep apnea.  Taper O2 as tolerated and monitor response.  8. Hypertension-blood pressure stable, continue metoprolol, verapamil.   9. History of CVA-continue aspirin, atorvastatin.  10. Constipation-moderate stool burden noted on CT scan.  Started on Colace.  Continue MiraLAX 17 g daily.    DVT prophylaxis: Warfarin Code Status: Full code Family / Patient  Communication: Family not present bedside.  Discussed with patient Disposition Plan: Awaiting rehab placement at [facility] per family request. Covid testing sent on 10/6     LOS: 5 days    Time spent: 25 minutes    Guilford Shi, MD [facility] Pager [phone]  If 7PM-7AM, please contact night-coverage [email or web address] Password [facility] 04/13/2019, 4:29 PM

## 2019-04-13 NOTE — Progress Notes (Signed)
ANTICOAGULATION CONSULT NOTE - Follow Up Consult  Pharmacy Consult for warfarin Indication: hx atrial fibrillation  No Known Allergies  Patient Measurements: Height: 5\' 3"  (160 cm) Weight: 179 lb 0.2 oz (81.2 kg) IBW/kg (Calculated) : 52.4 Heparin Dosing Weight:   Vital Signs: Temp: 97.7 F (36.5 C) (10/08 0618) Temp Source: Oral (10/08 0618) BP: 136/62 (10/08 0618) Pulse Rate: 68 (10/08 0618)  Labs: Recent Labs    04/11/19 0556 04/12/19 0536 04/13/19 0539  HGB  --  12.9  --   HCT  --  42.0  --   PLT  --  70*  --   LABPROT 26.2* 26.2* 28.1*  INR 2.5* 2.5* 2.7*  CREATININE 1.28* 1.26*  --     Estimated Creatinine Clearance: 29.9 mL/min (A) (by C-G formula based on SCr of 1.26 mg/dL (H)).   Medications:  - PTA warfarin regimen: 2.5 mg daily (last dose taken on 10/1)  Assessment: Patient is a 85 y.o F with hx afib on warfarin PTA, presented to the ED on 10/2 with c/o AMS, back and hip pain.  Warfarin resumed on admission.  Today, 04/13/2019: - INR 2.7  (warfarin doses held 10/2-10/4 due to supra- therapeutic INR) - hgb stable; plts low but stable at 70 (10/7) (was 75 in July 2020)  - no bleeding documented - no significant drug-drug intxns - po intake decreased  Goal of Therapy:  INR 2-3 Monitor platelets by anticoagulation protocol: Yes   Plan:  -Warfarin 2mg  po x 1 dose today - Daily INR - monitor for s/s bleeding  Minda Ditto, PharmD 04/13/2019,11:07 AM

## 2019-04-13 NOTE — Progress Notes (Signed)
Physical Therapy Treatment Patient Details Name: [name] MRN: [id number] DOB: 12-11-1926 Today's Date: 04/13/2019    History of Present Illness [name] is a 85 y.o. female with medical history significant for atrial fibrillation and history of PE on Coumadin, hypertension, hyperlipidemia, history of CVA, CKD stage III, asthma, thrombocytopenia, history of colon cancer, and mild cognitive impairment who presented to the ED for evaluation of confusion and lower back pain.  Family reports increased difficulty with ambulation.    PT Comments    Patient made good progress with therapy today and progressed to short distance of ambulation today. Patient continues to require mod assist for bed mob, transfers, and gait with RW. She remains limited by generalized weakness and requires assist to initiate stand and support to prevent LOB during gait. Patient will continue to benefit from skilled PT to address impairments and progress independence with functional mobility to PLOF. Acute PT will follow to progress as able.    Follow Up Recommendations  SNF;Supervision/Assistance - 24 hour     Equipment Recommendations  None recommended by PT    Recommendations for Other Services       Precautions / Restrictions Precautions Precautions: Fall Restrictions Weight Bearing Restrictions: No    Mobility  Bed Mobility Overal bed mobility: Needs Assistance Bed Mobility: Supine to Sit Rolling: Min assist   Supine to sit: Min assist;+2 for physical assistance;HOB elevated     General bed mobility comments: pt able to initiate LE movement and trunk press up with HOB elevated however assist required to compete  Transfers Overall transfer level: Needs assistance Equipment used: Rolling walker (2 wheeled) Transfers: Sit to/from [facility] Sit to Stand: From elevated surface;Mod assist;+2 safety/equipment         General transfer comment: Mod assist for pt to initiate  power up, EOB slighlty elevated, 2 person assist for safety with rising, pt able to maitain balacne with RW with 1 person Mod A  Ambulation/Gait Ambulation/Gait assistance: +2 safety/equipment;Mod assist Gait Distance (Feet): 25 Feet Assistive device: Rolling walker (2 wheeled) Gait Pattern/deviations: Step-to pattern;Decreased stride length;Decreased step length - left;Decreased step length - right;Trunk flexed;Shuffle Gait velocity: slow and labored   General Gait Details: slow pace and Min to Mod assist requried to maintain balance and cues for safe use of RW to keep hands in place, pt limited by weakness in bil LE's and shouler pain   Stairs             Wheelchair Mobility    Modified Rankin (Stroke Patients Only)       Balance Overall balance assessment: Needs assistance Sitting-balance support: Feet supported;Bilateral upper extremity supported Sitting balance-Leahy Scale: Fair     Standing balance support: During functional activity;Bilateral upper extremity supported Standing balance-Leahy Scale: Poor                              Cognition Arousal/Alertness: Awake/alert Behavior During Therapy: WFL for tasks assessed/performed Overall Cognitive Status: Within Functional Limits for tasks assessed                                        Exercises      General Comments        Pertinent Vitals/Pain Pain Assessment: Faces Faces Pain Scale: Hurts whole lot Pain Location: low back and R hip Pain Descriptors /  Indicators: Grimacing Pain Intervention(s): Limited activity within patient's tolerance;Monitored during session;Repositioned           PT Goals (current goals can now be found in the care plan section) Acute Rehab PT Goals Patient Stated Goal: get rid of pain PT Goal Formulation: With patient Time For Goal Achievement: 04/22/19 Potential to Achieve Goals: Good Progress towards PT goals: Progressing toward goals     Frequency    Min 2X/week      PT Plan Current plan remains appropriate       AM-PAC PT "6 Clicks" Mobility   Outcome Measure  Help needed turning from your back to your side while in a flat bed without using bedrails?: A Lot Help needed moving from lying on your back to sitting on the side of a flat bed without using bedrails?: A Lot Help needed moving to and from a bed to a chair (including a wheelchair)?: A Lot Help needed standing up from a chair using your arms (e.g., wheelchair or bedside chair)?: A Lot Help needed to walk in hospital room?: A Lot Help needed climbing 3-5 steps with a railing? : Total 6 Click Score: 11    End of Session Equipment Utilized During Treatment: Gait belt;Oxygen Activity Tolerance: Patient tolerated treatment well Patient left: in chair;with call bell/phone within reach;with chair alarm set Nurse Communication: Mobility status PT Visit Diagnosis: Other abnormalities of gait and mobility (R26.89);Pain     Time: 1140-1158 PT Time Calculation (min) (ACUTE ONLY): 18 min  Charges:  $Gait Training: 8-22 mins                     Kipp Brood, PT, DPT, [facility] Physical Therapist with [facility]  04/13/2019 4:14 PM

## 2019-04-14 LAB — BASIC METABOLIC PANEL
Anion gap: 12 (ref 5–15)
BUN: 26 mg/dL — ABNORMAL HIGH (ref 8–23)
CO2: 24 mmol/L (ref 22–32)
Calcium: 9.4 mg/dL (ref 8.9–10.3)
Chloride: 109 mmol/L (ref 98–111)
Creatinine, Ser: 1.13 mg/dL — ABNORMAL HIGH (ref 0.44–1.00)
GFR calc Af Amer: 50 mL/min — ABNORMAL LOW (ref >60)
GFR calc non Af Amer: 43 mL/min — ABNORMAL LOW (ref >60)
Glucose, Bld: 103 mg/dL — ABNORMAL HIGH (ref 70–99)
Potassium: 3.8 mmol/L (ref 3.5–5.1)
Sodium: 145 mmol/L (ref 135–145)

## 2019-04-14 LAB — PROTIME-INR
INR: 2.5 — ABNORMAL HIGH (ref 0.8–1.2)
Prothrombin Time: 26.6 seconds — ABNORMAL HIGH (ref 11.4–15.2)

## 2019-04-14 LAB — BRAIN NATRIURETIC PEPTIDE: B Natriuretic Peptide: 1139.3 pg/mL — ABNORMAL HIGH (ref 0.0–100.0)

## 2019-04-14 MED ORDER — FUROSEMIDE 10 MG/ML IJ SOLN
20.0000 mg | Freq: Once | INTRAMUSCULAR | Status: AC
  Administered 2019-04-14: 20 mg via INTRAVENOUS
  Filled 2019-04-14: qty 2

## 2019-04-14 MED ORDER — WARFARIN SODIUM 2 MG PO TABS
2.0000 mg | ORAL_TABLET | Freq: Once | ORAL | Status: AC
  Administered 2019-04-14: 17:00:00 2 mg via ORAL
  Filled 2019-04-14: qty 1

## 2019-04-14 NOTE — Progress Notes (Signed)
PROGRESS NOTE    [name]  [id number]  DOB: 06-21-1927  DOA: 04/07/2019 PCP: Colon Branch, MD  Brief Narrative:  85 year old female with history of atrial fibrillation, pulmonary embolism on Coumadin, hypertension, hyperlipidemia, history of CVA, CKD stage III, asthma, thrombocytopenia, history of colon cancer, mild cognitive impairment who came to the ED for evaluation of confusion and lower back pain.  Patient reported lower back pain difficulty walking.  Denied trauma or falls. Noted to have foul-smelling urine,admitted with empiric antibiotics for UTI-urine culture growing Klebsiella pneumoniae sensitive to ceftriaxone. X-ray of the lumbar spine showed no acute process, chronic degenerative disc disease.  PT/OT  consulted, started on oxycodone 5 mg every 6 hours PRN. Noted to have moderate stool burden on CT scan- on laxatives.  Subjective:  Denies any acute complaints.  Afebrile.  States feels good overall.  Noted to be on 4 L nasal cannula this morning, denies dyspnea. Not sure if she tends to desaturate in sleep.  Reports history of asthma but denies any history of smoking  Objective: Vitals:   04/13/19 2010 04/14/19 0459 04/14/19 0500 04/14/19 0801  BP: (!) 151/85 (!) 112/55    Pulse: 82 (!) 50    Resp: 16 16    Temp: 97.9 F (36.6 C) 98 F (36.7 C)    TempSrc:  Oral    SpO2: 97% 97%  97%  Weight:   80.8 kg   Height:        Intake/Output Summary (Last 24 hours) at 04/14/2019 1414 Last data filed at 04/13/2019 1457 Gross per 24 hour  Intake 118 ml  Output 275 ml  Net -157 ml   Filed Weights   04/10/19 0523 04/12/19 0500 04/14/19 0500  Weight: 82 kg 81.2 kg 80.8 kg    Physical Examination:  General exam: Appears calm and comfortable  Respiratory system: Clear to auscultation. Respiratory effort normal. Cardiovascular system: S1 & S2 heard, RRR. No JVD, murmurs, rubs, gallops or clicks. No pedal edema. Gastrointestinal system: Abdomen is nondistended, soft  and nontender. No organomegaly or masses felt. Normal bowel sounds heard. Central nervous system: Alert and oriented. No focal neurological deficits. Extremities: Symmetric 4 x 5 power.  No edema Skin: No rashes, lesions or ulcers Psychiatry: Judgement and insight appear normal. Mood & affect appropriate.     Data Reviewed: I have personally reviewed following labs and imaging studies  CBC: Recent Labs  Lab 04/07/19 1530 04/08/19 0021 04/12/19 0536  WBC 8.2 6.9 5.3  NEUTROABS 6.5  --   --   HGB 13.3 12.7 12.9  HCT 41.2 40.0 42.0  MCV 95.2 97.6 100.0  PLT 75* 76* 70*   Basic Metabolic Panel: Recent Labs  Lab 04/08/19 0021 04/09/19 0456 04/10/19 0557 04/11/19 0556 04/12/19 0536 04/14/19 0532  NA 144 147* 146* 148* 147* 145  K 3.5 3.9 4.3 4.3 4.1 3.8  CL 107 113* 112* 111 108 109  CO2 27 27 26 27 25 24   GLUCOSE 114* 99 87 87 93 103*  BUN 41* 36* 35* 31* 29* 26*  CREATININE 1.96* 1.62* 1.50* 1.28* 1.26* 1.13*  CALCIUM 9.2 8.8* 8.7* 9.0 9.3 9.4  MG 2.2  --   --   --   --   --   PHOS 4.0  --   --   --   --   --    GFR: Estimated Creatinine Clearance: 33.3 mL/min (A) (by C-G formula based on SCr of 1.13 mg/dL (H)). Liver Function Tests: Recent  Labs  Lab 04/07/19 1530  AST 31  ALT 15  ALKPHOS 56  BILITOT 1.4*  PROT 6.9  ALBUMIN 4.7   No results for input(s): LIPASE, AMYLASE in the last 168 hours. No results for input(s): AMMONIA in the last 168 hours. Coagulation Profile: Recent Labs  Lab 04/10/19 0557 04/11/19 0556 04/12/19 0536 04/13/19 0539 04/14/19 0532  INR 2.8* 2.5* 2.5* 2.7* 2.5*   Cardiac Enzymes: No results for input(s): CKTOTAL, CKMB, CKMBINDEX, TROPONINI in the last 168 hours. BNP (last 3 results) No results for input(s): PROBNP in the last 8760 hours. HbA1C: No results for input(s): HGBA1C in the last 72 hours. CBG: No results for input(s): GLUCAP in the last 168 hours. Lipid Profile: No results for input(s): CHOL, HDL, LDLCALC, TRIG,  CHOLHDL, LDLDIRECT in the last 72 hours. Thyroid Function Tests: No results for input(s): TSH, T4TOTAL, FREET4, T3FREE, THYROIDAB in the last 72 hours. Anemia Panel: No results for input(s): VITAMINB12, FOLATE, FERRITIN, TIBC, IRON, RETICCTPCT in the last 72 hours. Sepsis Labs: No results for input(s): PROCALCITON, LATICACIDVEN in the last 168 hours.  Recent Results (from the past 240 hour(s))  Urine culture     Status: Abnormal   Collection Time: 04/07/19 12:12 PM   Specimen: Urine, Random  Result Value Ref Range Status   Specimen Description   Final    URINE, RANDOM Performed at [facility] [address], [place], [place] [address]    Special Requests   Final    NONE Performed at [facility], [place] [address], [place], [place] [address]    Culture >=100,000 COLONIES/mL KLEBSIELLA PNEUMONIAE (A)  Final   Report Status 04/10/2019 FINAL  Final   Organism ID, Bacteria KLEBSIELLA PNEUMONIAE (A)  Final      Susceptibility   Klebsiella pneumoniae - MIC*    AMPICILLIN >=32 RESISTANT Resistant     CEFAZOLIN <=4 SENSITIVE Sensitive     CEFTRIAXONE <=1 SENSITIVE Sensitive     CIPROFLOXACIN <=0.25 SENSITIVE Sensitive     GENTAMICIN <=1 SENSITIVE Sensitive     IMIPENEM 0.5 SENSITIVE Sensitive     NITROFURANTOIN 64 INTERMEDIATE Intermediate     TRIMETH/SULFA <=20 SENSITIVE Sensitive     AMPICILLIN/SULBACTAM 8 SENSITIVE Sensitive     PIP/TAZO <=4 SENSITIVE Sensitive     Extended ESBL NEGATIVE Sensitive     * >=100,000 COLONIES/mL KLEBSIELLA PNEUMONIAE  SARS Coronavirus 2 [facility] order, Performed in [facility] hospital lab) Nasopharyngeal Nasopharyngeal Swab     Status: None   Collection Time: 04/07/19 10:30 PM   Specimen: Nasopharyngeal Swab  Result Value Ref Range Status   SARS Coronavirus 2 NEGATIVE NEGATIVE Final    Comment: (NOTE) If result is NEGATIVE SARS-CoV-2 target nucleic acids are NOT DETECTED. The SARS-CoV-2 RNA is generally  detectable in upper and lower  respiratory specimens during the acute phase of infection. The lowest  concentration of SARS-CoV-2 viral copies this assay can detect is 250  copies / mL. A negative result does not preclude SARS-CoV-2 infection  and should not be used as the sole basis for treatment or other  patient management decisions.  A negative result may occur with  improper specimen collection / handling, submission of specimen other  than nasopharyngeal swab, presence of viral mutation(s) within the  areas targeted by this assay, and inadequate number of viral copies  (<250 copies / mL). A negative result must be combined with clinical  observations, patient history, and epidemiological information. If result is POSITIVE SARS-CoV-2  target nucleic acids are DETECTED. The SARS-CoV-2 RNA is generally detectable in upper and lower  respiratory specimens dur ing the acute phase of infection.  Positive  results are indicative of active infection with SARS-CoV-2.  Clinical  correlation with patient history and other diagnostic information is  necessary to determine patient infection status.  Positive results do  not rule out bacterial infection or co-infection with other viruses. If result is PRESUMPTIVE POSTIVE SARS-CoV-2 nucleic acids MAY BE PRESENT.   A presumptive positive result was obtained on the submitted specimen  and confirmed on repeat testing.  While 2019 novel coronavirus  (SARS-CoV-2) nucleic acids may be present in the submitted sample  additional confirmatory testing may be necessary for epidemiological  and / or clinical management purposes  to differentiate between  SARS-CoV-2 and other Sarbecovirus currently known to infect humans.  If clinically indicated additional testing with an alternate test  methodology [phone]) is advised. The SARS-CoV-2 RNA is generally  detectable in upper and lower respiratory sp ecimens during the acute  phase of infection. The  expected result is Negative. Fact Sheet for Patients:  StrictlyIdeas.no Fact Sheet for Healthcare Providers: BankingDealers.co.za This test is not yet approved or cleared by the [place] FDA and has been authorized for detection and/or diagnosis of SARS-CoV-2 by FDA under an Emergency Use Authorization (EUA).  This EUA will remain in effect (meaning this test can be used) for the duration of the COVID-19 declaration under Section 564(b)(1) of the Act, 21 U.S.C. section 360bbb-3(b)(1), unless the authorization is terminated or revoked sooner. Performed at [facility], [place] [address] [place], [place] [address]       Radiology Studies: Dg Chest Port 1 View  Result Date: 04/12/2019 CLINICAL DATA:  Atrial fibrillation EXAM: PORTABLE CHEST 1 VIEW COMPARISON:  01/20/2019 FINDINGS: Cardiac shadow is enlarged but stable. Aortic calcifications are again seen. The lungs are well aerated bilaterally. No focal infiltrate or sizable effusion is seen. No bony abnormality is noted. IMPRESSION: No acute abnormality noted. Electronically Signed   By: Inez Catalina M.D.   On: 04/12/2019 20:14        Scheduled Meds: . atorvastatin  20 mg Oral q1800  . docusate sodium  100 mg Oral BID  . feeding supplement (ENSURE ENLIVE)  237 mL Oral BID BM  . isosorbide mononitrate  15 mg Oral Daily  . metoprolol tartrate  37.5 mg Oral BID  . mometasone-formoterol  2 puff Inhalation BID  . polyethylene glycol  17 g Oral Daily  . QUEtiapine  50 mg Oral QHS  . verapamil  120 mg Oral QHS  . warfarin  2 mg Oral ONCE-1800  . warfarin  2 mg Oral ONCE-1800  . Warfarin - Pharmacist Dosing Inpatient   Does not apply q1800   Continuous Infusions:   Assessment & Plan:    1.  Klebsiella UTI with metabolic encephalopathy: Present on admission.  S/p 5 of antibiotics-Rocephin.  Afebrile with no leukocytosis.  2.  AKI on CKD stage III: Patient's  baseline creatinine appears to be 1.3-1.5.  On admission serum creatinine was 2.34 and now improved to 1.1 with fluids.  3.  Chronic hypernatremia: Patient appears to have had baseline sodium levels around 140-148 since 2017.  On admission her sodium level was 143 and then went upto 147.  Patient did receive normal saline during hospital course and resumed on Lasix at 40 mg on 10/6.  Held Lasix on 10/7 and encouraged free water intake-improved sodium to  145  4.  Chronic diastolic CHF:EF 60 to [id number] by TTE on 01/21/2019.  Lasix was initially held but resumed again as patient was requiring oxygen. Chest x-ray -ve for pulmonary edema and BNP at 1139 ( previously around 500). Although clinically looks good, concern for increasing 02 requirements, will resume Lasix at a lower dose of 20 mg  daily -will need periodic labs to monitor sodium level.     5.  Chronic low back pain: Patient has chronic DDD.  X-ray of the lumbar spine showed no acute process.  Continue Tylenol as needed, also started on oxycodone as needed while here. Cautious with opiates in elderly patient with underlying dementia as well as problem #7.  Per review of MAR receiving oxycodone about once a day-changed to tramadol  6. Permanent atrial fibrillation-EKG showed atrial fibrillation, heart rate is controlled.CHA2DS2VASc score is 8.  Patient is on anticoagulation with Coumadin.  Continue metoprolol, verapamil for rate control.  7. Acute versus chronic hypoxic respiratory failure with underlying asthma/chronic diastolic CHF: Patient requiring 2 L of O2 via nasal cannula here.  Denies history of smoking or COPD.  She does have a history of asthma.  No wheezing on exam currently.  Chest x-ray without any infiltrates or pulmonary edema or pleural effusion..  Doubt PE as patient on anticoagulation with therapeutic INR.?  Sleep apnea.  Lasix reinitiated. Taper O2 as tolerated and monitor response.  8. Hypertension-blood pressure stable, continue  metoprolol, verapamil.   9. History of CVA-continue aspirin, atorvastatin.  10. Constipation-moderate stool burden noted on CT scan.  Started on Colace.  Continue MiraLAX 17 g daily.    DVT prophylaxis: Warfarin Code Status: Full code Family / Patient Communication: Family not present bedside.  Discussed with patient Disposition Plan: Awaiting rehab placement at [facility] per family request. Covid testing ordered on 10/6 but not sure if collected. Will order again.      LOS: 6 days    Time spent: 35 minutes    Guilford Shi, MD [facility] Pager [phone]  If 7PM-7AM, please contact night-coverage [email or web address] Password [email or web address] 04/14/2019, 2:14 PM

## 2019-04-14 NOTE — Progress Notes (Signed)
Spoke with pt's daughter [name] for an update. She reports her mother did well at [facility] and really liked the staff there. She would like her to return there at [place]. I instructed that I would pass along the info.

## 2019-04-14 NOTE — TOC Progression Note (Signed)
Transition of Care [facility]) - Progression Note    Patient Details  Name: [name] MRN: [place]:[id number] Date of Birth: [birth year]  Transition of Care [facility]) CM/SW [place], [place] Phone Number: [phone] 04/14/2019, 11:58 AM  Clinical Narrative:   Pt planning to admit to [facility] SNF for rehab at [place]. Daughter and facility communicating about admission needs and COVID restrictions at facility.  Pt needs updated covid result prior to admitting to SNF.    Expected Discharge Plan: [place] Barriers to Discharge: Continued Medical Work up  Expected Discharge Plan and Services Expected Discharge Plan: [place] In-house Referral: Clinical Social Work   Post Acute Care Choice: [place] Living arrangements for the past 2 months: Single Family Home Expected Discharge Date: 04/09/19                                     Social Determinants of Health (SDOH) Interventions    Readmission Risk Interventions No flowsheet data found.

## 2019-04-14 NOTE — Progress Notes (Signed)
ANTICOAGULATION CONSULT NOTE - Follow Up Consult  Pharmacy Consult for warfarin Indication: hx atrial fibrillation  No Known Allergies  Patient Measurements: Height: 5\' 3"  (160 cm) Weight: 178 lb 2.1 oz (80.8 kg) IBW/kg (Calculated) : 52.4 Heparin Dosing Weight:   Vital Signs: Temp: 98 F (36.7 C) (10/09 0459) Temp Source: Oral (10/09 0459) BP: 112/55 (10/09 0459) Pulse Rate: 50 (10/09 0459)  Labs: Recent Labs    04/12/19 0536 04/13/19 0539 04/14/19 0532  HGB 12.9  --   --   HCT 42.0  --   --   PLT 70*  --   --   LABPROT 26.2* 28.1* 26.6*  INR 2.5* 2.7* 2.5*  CREATININE 1.26*  --  1.13*   Estimated Creatinine Clearance: 33.3 mL/min (A) (by C-G formula based on SCr of 1.13 mg/dL (H)).  Medications:  - PTA warfarin regimen: 2.5 mg daily (last dose taken on 10/1)  Assessment: Patient is a 85 y.o F with hx afib on warfarin PTA, presented to the ED on 10/2 with c/o AMS, back and hip pain.  Warfarin resumed on admission.  Today, 04/14/2019: - INR 2.5, 10/8 Warfarin not given - unknown reason  (warfarin doses held 10/2-10/4 due to supra- therapeutic INR) - hgb stable; plts low but stable at 70 (10/7) (was 75 in July 2020)  - no bleeding documented - no significant drug-drug intxns - po intake decreased  Goal of Therapy:  INR 2-3 Monitor platelets by anticoagulation protocol: Yes   Plan:  -Warfarin 2mg  po x 1 dose today - Daily INR - CBC 10/10 - monitor for s/s bleeding  Minda Ditto, PharmD 04/14/2019,10:29 AM

## 2019-04-15 LAB — NOVEL CORONAVIRUS, NAA (HOSP ORDER, SEND-OUT TO REF LAB; TAT 18-24 HRS): SARS-CoV-2, NAA: NOT DETECTED

## 2019-04-15 LAB — PROTIME-INR
INR: 2.1 — ABNORMAL HIGH (ref 0.8–1.2)
Prothrombin Time: 23 seconds — ABNORMAL HIGH (ref 11.4–15.2)

## 2019-04-15 MED ORDER — FUROSEMIDE 20 MG PO TABS
20.0000 mg | ORAL_TABLET | Freq: Every day | ORAL | Status: DC
  Administered 2019-04-15 – 2019-04-17 (×3): 20 mg via ORAL
  Filled 2019-04-15 (×3): qty 1

## 2019-04-15 MED ORDER — WARFARIN SODIUM 2.5 MG PO TABS
2.5000 mg | ORAL_TABLET | Freq: Every day | ORAL | Status: DC
  Administered 2019-04-15 – 2019-04-16 (×2): 2.5 mg via ORAL
  Filled 2019-04-15 (×2): qty 1

## 2019-04-15 MED ORDER — CALCIUM CARBONATE ANTACID 500 MG PO CHEW
1.0000 | CHEWABLE_TABLET | Freq: Once | ORAL | Status: AC
  Administered 2019-04-15: 06:00:00 200 mg via ORAL
  Filled 2019-04-15: qty 1

## 2019-04-15 MED ORDER — MUSCLE RUB 10-15 % EX CREA
TOPICAL_CREAM | CUTANEOUS | Status: DC | PRN
  Administered 2019-04-15: 22:00:00 via TOPICAL
  Filled 2019-04-15: qty 85

## 2019-04-15 NOTE — Progress Notes (Signed)
ANTICOAGULATION CONSULT NOTE - Follow Up Consult  Pharmacy Consult for warfarin Indication: hx atrial fibrillation  No Known Allergies  Patient Measurements: Height: 5\' 3"  (160 cm) Weight: 173 lb 11.6 oz (78.8 kg) IBW/kg (Calculated) : 52.4 Heparin Dosing Weight:   Vital Signs: Temp: 97.9 F (36.6 C) (10/10 1340) BP: 134/72 (10/10 1340) Pulse Rate: 76 (10/10 1340)  Labs: Recent Labs    04/13/19 0539 04/14/19 0532 04/15/19 0629  LABPROT 28.1* 26.6* 23.0*  INR 2.7* 2.5* 2.1*  CREATININE  --  1.13*  --    Estimated Creatinine Clearance: 32.9 mL/min (A) (by C-G formula based on SCr of 1.13 mg/dL (H)).  Medications:  - PTA warfarin regimen: 2.5 mg daily (last dose taken on 10/1)  Assessment: Patient is a 85 y.o F with hx afib on warfarin PTA, presented to the ED on 10/2 with c/o AMS, back and hip pain.  Warfarin resumed on admission.  Today, 04/15/2019: - INR 2.1 therapeutic, dose not charted 10/8, charted twice 10/9 - no bleeding documented - no significant drug-drug intxns  Goal of Therapy:  INR 2-3 Monitor platelets by anticoagulation protocol: Yes   Plan:  -resume home dose of Warfarin 2.5 mg po daily - Daily INR - monitor for s/s bleeding  Eudelia Bunch, Pharm.D [phone] 04/15/2019 3:51 PM

## 2019-04-15 NOTE — Progress Notes (Signed)
PROGRESS NOTE    [name]  [id number]  DOB: [birth year]  DOA: 04/07/2019 PCP: Colon Branch, MD  Brief Narrative:  85 year old female with history of atrial fibrillation, pulmonary embolism on Coumadin, hypertension, hyperlipidemia, history of CVA, CKD stage III, asthma, thrombocytopenia, history of colon cancer, mild cognitive impairment who came to the ED for evaluation of confusion and lower back pain.  Patient reported lower back pain difficulty walking.  Denied trauma or falls. Noted to have foul-smelling urine,admitted with empiric antibiotics for UTI-urine culture growing Klebsiella pneumoniae sensitive to ceftriaxone. X-ray of the lumbar spine showed no acute process, chronic degenerative disc disease.  PT/OT  consulted, started on oxycodone 5 mg every 6 hours PRN. Noted to have moderate stool burden on CT scan- on laxatives.  Subjective:  Denies any acute complaints.  Afebrile. Tapered Off 02 today and reports feeling good without dyspnea. Not sure if she tends to desaturate in sleep.    Objective: Vitals:   04/15/19 0500 04/15/19 0501 04/15/19 0916 04/15/19 0929  BP:  (!) 124/56 136/83   Pulse:  (!) 57 65   Resp:  16    Temp:  97.8 F (36.6 C)    TempSrc:      SpO2:  91%  93%  Weight: 78.8 kg     Height:        Intake/Output Summary (Last 24 hours) at 04/15/2019 1059 Last data filed at 04/14/2019 1900 Gross per 24 hour  Intake --  Output 400 ml  Net -400 ml   Filed Weights   04/12/19 0500 04/14/19 0500 04/15/19 0500  Weight: 81.2 kg 80.8 kg 78.8 kg    Physical Examination:  General exam: Appears calm and comfortable  Respiratory system: Clear to auscultation. Respiratory effort normal. Cardiovascular system: S1 & S2 heard, RRR. No JVD, murmurs, rubs, gallops or clicks. No pedal edema. Gastrointestinal system: Abdomen is nondistended, soft and nontender. No organomegaly or masses felt. Normal bowel sounds heard. Central nervous system: Alert and  oriented. No focal neurological deficits. Extremities: Symmetric 4 x 5 power.  No edema Skin: No rashes, lesions or ulcers Psychiatry: Judgement and insight appear normal. Mood & affect appropriate.     Data Reviewed: I have personally reviewed following labs and imaging studies  CBC: Recent Labs  Lab 04/12/19 0536  WBC 5.3  HGB 12.9  HCT 42.0  MCV 100.0  PLT 70*   Basic Metabolic Panel: Recent Labs  Lab 04/09/19 0456 04/10/19 0557 04/11/19 0556 04/12/19 0536 04/14/19 0532  NA 147* 146* 148* 147* 145  K 3.9 4.3 4.3 4.1 3.8  CL 113* 112* 111 108 109  CO2 27 26 27 25 24   GLUCOSE 99 87 87 93 103*  BUN 36* 35* 31* 29* 26*  CREATININE 1.62* 1.50* 1.28* 1.26* 1.13*  CALCIUM 8.8* 8.7* 9.0 9.3 9.4   GFR: Estimated Creatinine Clearance: 32.9 mL/min (A) (by C-G formula based on SCr of 1.13 mg/dL (H)). Liver Function Tests: No results for input(s): AST, ALT, ALKPHOS, BILITOT, PROT, ALBUMIN in the last 168 hours. No results for input(s): LIPASE, AMYLASE in the last 168 hours. No results for input(s): AMMONIA in the last 168 hours. Coagulation Profile: Recent Labs  Lab 04/11/19 0556 04/12/19 0536 04/13/19 0539 04/14/19 0532 04/15/19 0629  INR 2.5* 2.5* 2.7* 2.5* 2.1*   Cardiac Enzymes: No results for input(s): CKTOTAL, CKMB, CKMBINDEX, TROPONINI in the last 168 hours. BNP (last 3 results) No results for input(s): PROBNP in the last 8760 hours. HbA1C: No  results for input(s): HGBA1C in the last 72 hours. CBG: No results for input(s): GLUCAP in the last 168 hours. Lipid Profile: No results for input(s): CHOL, HDL, LDLCALC, TRIG, CHOLHDL, LDLDIRECT in the last 72 hours. Thyroid Function Tests: No results for input(s): TSH, T4TOTAL, FREET4, T3FREE, THYROIDAB in the last 72 hours. Anemia Panel: No results for input(s): VITAMINB12, FOLATE, FERRITIN, TIBC, IRON, RETICCTPCT in the last 72 hours. Sepsis Labs: No results for input(s): PROCALCITON, LATICACIDVEN in the last  168 hours.  Recent Results (from the past 240 hour(s))  Urine culture     Status: Abnormal   Collection Time: 04/07/19 12:12 PM   Specimen: Urine, Random  Result Value Ref Range Status   Specimen Description   Final    URINE, RANDOM Performed at [facility] [address], [place], [place] [address]    Special Requests   Final    NONE Performed at [facility], [place] [address], [place], [place] [address]    Culture >=100,000 COLONIES/mL KLEBSIELLA PNEUMONIAE (A)  Final   Report Status 04/10/2019 FINAL  Final   Organism ID, Bacteria KLEBSIELLA PNEUMONIAE (A)  Final      Susceptibility   Klebsiella pneumoniae - MIC*    AMPICILLIN >=32 RESISTANT Resistant     CEFAZOLIN <=4 SENSITIVE Sensitive     CEFTRIAXONE <=1 SENSITIVE Sensitive     CIPROFLOXACIN <=0.25 SENSITIVE Sensitive     GENTAMICIN <=1 SENSITIVE Sensitive     IMIPENEM 0.5 SENSITIVE Sensitive     NITROFURANTOIN 64 INTERMEDIATE Intermediate     TRIMETH/SULFA <=20 SENSITIVE Sensitive     AMPICILLIN/SULBACTAM 8 SENSITIVE Sensitive     PIP/TAZO <=4 SENSITIVE Sensitive     Extended ESBL NEGATIVE Sensitive     * >=100,000 COLONIES/mL KLEBSIELLA PNEUMONIAE  SARS Coronavirus 2 [facility] order, Performed in [facility] hospital lab) Nasopharyngeal Nasopharyngeal Swab     Status: None   Collection Time: 04/07/19 10:30 PM   Specimen: Nasopharyngeal Swab  Result Value Ref Range Status   SARS Coronavirus 2 NEGATIVE NEGATIVE Final    Comment: (NOTE) If result is NEGATIVE SARS-CoV-2 target nucleic acids are NOT DETECTED. The SARS-CoV-2 RNA is generally detectable in upper and lower  respiratory specimens during the acute phase of infection. The lowest  concentration of SARS-CoV-2 viral copies this assay can detect is 250  copies / mL. A negative result does not preclude SARS-CoV-2 infection  and should not be used as the sole basis for treatment or other  patient management decisions.  A  negative result may occur with  improper specimen collection / handling, submission of specimen other  than nasopharyngeal swab, presence of viral mutation(s) within the  areas targeted by this assay, and inadequate number of viral copies  (<250 copies / mL). A negative result must be combined with clinical  observations, patient history, and epidemiological information. If result is POSITIVE SARS-CoV-2 target nucleic acids are DETECTED. The SARS-CoV-2 RNA is generally detectable in upper and lower  respiratory specimens dur ing the acute phase of infection.  Positive  results are indicative of active infection with SARS-CoV-2.  Clinical  correlation with patient history and other diagnostic information is  necessary to determine patient infection status.  Positive results do  not rule out bacterial infection or co-infection with other viruses. If result is PRESUMPTIVE POSTIVE SARS-CoV-2 nucleic acids MAY BE PRESENT.   A presumptive positive result was obtained on the submitted specimen  and confirmed on repeat testing.  While 2019  novel coronavirus  (SARS-CoV-2) nucleic acids may be present in the submitted sample  additional confirmatory testing may be necessary for epidemiological  and / or clinical management purposes  to differentiate between  SARS-CoV-2 and other Sarbecovirus currently known to infect humans.  If clinically indicated additional testing with an alternate test  methodology [phone]) is advised. The SARS-CoV-2 RNA is generally  detectable in upper and lower respiratory sp ecimens during the acute  phase of infection. The expected result is Negative. Fact Sheet for Patients:  StrictlyIdeas.no Fact Sheet for Healthcare Providers: BankingDealers.co.za This test is not yet approved or cleared by the [place] FDA and has been authorized for detection and/or diagnosis of SARS-CoV-2 by FDA under an Emergency Use  Authorization (EUA).  This EUA will remain in effect (meaning this test can be used) for the duration of the COVID-19 declaration under Section 564(b)(1) of the Act, 21 U.S.C. section 360bbb-3(b)(1), unless the authorization is terminated or revoked sooner. Performed at [facility], [place] [address], [place], [place] [address]       Radiology Studies: No results found.      Scheduled Meds:  atorvastatin  20 mg Oral q1800   docusate sodium  100 mg Oral BID   feeding supplement (ENSURE ENLIVE)  237 mL Oral BID BM   furosemide  20 mg Oral Daily   isosorbide mononitrate  15 mg Oral Daily   metoprolol tartrate  37.5 mg Oral BID   mometasone-formoterol  2 puff Inhalation BID   polyethylene glycol  17 g Oral Daily   QUEtiapine  50 mg Oral QHS   verapamil  120 mg Oral QHS   Warfarin - Pharmacist Dosing Inpatient   Does not apply q1800   Continuous Infusions:   Assessment & Plan:    1.  Klebsiella UTI with metabolic encephalopathy: Present on admission.  S/p 5 of antibiotics-Rocephin.  Afebrile with no leukocytosis.  2.  AKI on CKD stage III: Patient's baseline creatinine appears to be 1.3-1.5.  On admission serum creatinine was 2.34 and now improved to 1.1 with fluids.  3.  Chronic hypernatremia: Patient appears to have had baseline sodium levels around 140-148 since 2017.  On admission her sodium level was 143 and then went upto 147.  Patient did receive normal saline during hospital course and resumed on Lasix at 40 mg on 10/6.  Held Lasix on 10/7 and encouraged free water intake-improved sodium to 145  4.  Chronic diastolic CHF:EF 60 to [id number] by TTE on 01/21/2019.  Lasix was initially held but resumed again as patient was requiring oxygen. Chest x-ray -ve for pulmonary edema and BNP at 1139 ( previously around 500). Although clinically looks good, concern for 02 requirements, resumed Lasix at a lower dose of 20 mg  daily -will need periodic labs to  monitor sodium level. 10/10--tapered O2 to off.   5.  Chronic low back pain: Patient has chronic DDD.  X-ray of the lumbar spine showed no acute process.  Continue Tylenol as needed, also started on oxycodone as needed while here. Cautious with opiates in elderly patient with underlying dementia as well as problem #7.  Per review of MAR receiving oxycodone about once a day-changed to tramadol  6. Permanent atrial fibrillation-EKG showed atrial fibrillation, heart rate is controlled.CHA2DS2VASc score is 8.  Patient is on anticoagulation with Coumadin.  Continue metoprolol, verapamil for rate control.  7. Acute versus chronic hypoxic respiratory failure with underlying asthma/chronic diastolic CHF: Patient requiring 2 L of  O2 via nasal cannula here.  Denies history of smoking or COPD.  She does have a history of asthma.  No wheezing on exam currently.  Chest x-ray without any infiltrates or pulmonary edema or pleural effusion..  Doubt PE as patient on anticoagulation with therapeutic INR. Lasix reinitiated at 20mg . Tapered O2 to off- ?  Sleep apnea. Requested nurse to monitor 02 sat during sleep  8. Hypertension-blood pressure stable, continue metoprolol, verapamil.   9. History of CVA-continue aspirin, atorvastatin.  10. Constipation-moderate stool burden noted on CT scan.  Started on Colace.  Continue MiraLAX 17 g daily.    DVT prophylaxis: Warfarin Code Status: Full code Family / Patient Communication: Family not present bedside.  Discussed with patient Disposition Plan: Awaiting rehab placement at [facility] per family request. Covid testing ordered on 10/9 -pending results   LOS: 7 days    Time spent: 35 minutes    Guilford Shi, MD [facility] Pager [phone]  If 7PM-7AM, please contact night-coverage [email or web address] Password [email or web address] 04/15/2019, 10:59 AM

## 2019-04-16 LAB — PROTIME-INR
INR: 2 — ABNORMAL HIGH (ref 0.8–1.2)
Prothrombin Time: 22.7 seconds — ABNORMAL HIGH (ref 11.4–15.2)

## 2019-04-16 MED ORDER — TRAMADOL HCL 50 MG PO TABS: 50.0000 mg | ORAL_TABLET | Freq: Four times a day (QID) | ORAL | 0 refills | Status: AC | PRN

## 2019-04-16 NOTE — Progress Notes (Signed)
ANTICOAGULATION CONSULT NOTE - Follow Up Consult  Pharmacy Consult for warfarin Indication: hx atrial fibrillation  No Known Allergies  Patient Measurements: Height: 5\' 3"  (160 cm) Weight: 174 lb 6.1 oz (79.1 kg) IBW/kg (Calculated) : 52.4 Heparin Dosing Weight:   Vital Signs: Temp: 98.4 F (36.9 C) (10/11 1341) Temp Source: Oral (10/11 1341) BP: 125/63 (10/11 1341) Pulse Rate: 61 (10/11 1341)  Labs: Recent Labs    04/14/19 0532 04/15/19 0629 04/16/19 0550  LABPROT 26.6* 23.0* 22.7*  INR 2.5* 2.1* 2.0*  CREATININE 1.13*  --   --    Estimated Creatinine Clearance: 33 mL/min (A) (by C-G formula based on SCr of 1.13 mg/dL (H)).  Medications:  - PTA warfarin regimen: 2.5 mg daily (last dose taken on 10/1)  Assessment: Patient is a 85 y.o F with hx afib on warfarin PTA, presented to the ED on 10/2 with c/o AMS, back and hip pain.  Warfarin resumed on admission.  Today, 04/16/2019: - INR 2.0 therapeutic, dose not charted 10/8, charted twice 10/9 - no bleeding documented - no significant drug-drug intxns  Goal of Therapy:  INR 2-3 Monitor platelets by anticoagulation protocol: Yes   Plan:  -continue home dose of Warfarin 2.5 mg po daily - Daily INR- consider decreasing to MWF only - monitor for s/s bleeding  Eudelia Bunch, Pharm.D [phone] 04/16/2019 2:34 PM

## 2019-04-16 NOTE — Progress Notes (Signed)
PROGRESS NOTE    [name]  [id number] DOB: 02/09/1927 DOA: 04/07/2019 PCP: Colon Branch, MD   Brief Narrative:  85 year old with history of atrial fibrillation/pulmonary embolism on Coumadin, essential hypertension, hyperlipidemia, CVA, CKD stage III, asthma, colon cancer came to the hospital with complains of confusion and lower back pain.  This was causing some difficulty walking.  UA was suggestive of urinary tract infection with Klebsiella treated with Rocephin.  X-ray of the lumbar spine showed degenerative disc disease.   Assessment & Plan:   Principal Problem:   Acute kidney injury superimposed on chronic kidney disease ([place]) Active Problems:   Essential hypertension   ATRIAL FIBRILLATION     Dyslipidemia   History of CVA (cerebrovascular accident)   Thrombocytopenia (HCC)   Asthma   Chronic diastolic (congestive) heart failure (HCC)   Back pain  Metabolic encephalopathy secondary to Klebsiella urinary tract infection, present on admission -Treated with 5 days of IV Rocephin.  Remains afebrile.  Acute kidney injury on CKD stage III -Baseline 1.4.  Resolved.  Chronic diastolic congestive heart failure, ejection fraction 60 to 65% - Initially Lasix was held.  Now appears to be euvolemic.  Resume Lasix at 20 mg daily  Chronic low back pain - X-ray is negative.  Pain control.  Permanent atrial fibrillation -Rate controlled with metoprolol and verapamil -On Coumadin  History of CVA -On aspirin, statin and Coumadin  Essential hypertension -Continue home meds  Constipation -Stool softeners.    DVT prophylaxis: Coumadin Code Status: Full Family Communication:   Disposition Plan: [facility].  COVID-19-neg  Procedures:   None  Antimicrobials:   Completed course of IV Rocephin   Subjective: No acute events overnight, no complaints.  Review of Systems Otherwise negative except as per HPI, including: General: Denies fever,  chills, night sweats or unintended weight loss. Resp: Denies cough, wheezing, shortness of breath. Cardiac: Denies chest pain, palpitations, orthopnea, paroxysmal nocturnal dyspnea. GI: Denies abdominal pain, nausea, vomiting, diarrhea or constipation GU: Denies dysuria, frequency, hesitancy or incontinence MS: Denies muscle aches, joint pain or swelling Neuro: Denies headache, neurologic deficits (focal weakness, numbness, tingling), abnormal gait Psych: Denies anxiety, depression, SI/HI/AVH Skin: Denies new rashes or lesions ID: Denies sick contacts, exotic exposures, travel  Objective: Vitals:   04/15/19 1340 04/15/19 2001 04/15/19 2126 04/16/19 0531  BP: 134/72  129/67 (!) 125/56  Pulse: 76  98 68  Resp: 20  20 14   Temp: 97.9 F (36.6 C)  97.9 F (36.6 C) 98.2 F (36.8 C)  TempSrc:   Oral Oral  SpO2: 90% 91% 91% 92%  Weight:    79.1 kg  Height:        Intake/Output Summary (Last 24 hours) at 04/16/2019 0911 Last data filed at 04/15/2019 2127 Gross per 24 hour  Intake 120 ml  Output 670 ml  Net -550 ml   Filed Weights   04/14/19 0500 04/15/19 0500 04/16/19 0531  Weight: 80.8 kg 78.8 kg 79.1 kg    Examination:  General exam: Appears calm and comfortable, elderly frail Respiratory system: Clear to auscultation. Respiratory effort normal. Cardiovascular system: S1 & S2 heard, RRR. No JVD, murmurs, rubs, gallops or clicks. No pedal edema. Gastrointestinal system: Abdomen is nondistended, soft and nontender. No organomegaly or masses felt. Normal bowel sounds heard. Central nervous system: Alert and oriented. No focal neurological deficits. Extremities: Symmetric 4 x 5 power. Skin: No rashes, lesions or ulcers Psychiatry: Judgement and insight appear normal. Mood & affect appropriate.  External Foley catheter  Data Reviewed:   CBC: Recent Labs  Lab 04/12/19 0536  WBC 5.3  HGB 12.9  HCT 42.0  MCV 100.0  PLT 70*   Basic Metabolic Panel: Recent Labs  Lab  04/10/19 0557 04/11/19 0556 04/12/19 0536 04/14/19 0532  NA 146* 148* 147* 145  K 4.3 4.3 4.1 3.8  CL 112* 111 108 109  CO2 26 27 25 24   GLUCOSE 87 87 93 103*  BUN 35* 31* 29* 26*  CREATININE 1.50* 1.28* 1.26* 1.13*  CALCIUM 8.7* 9.0 9.3 9.4   GFR: Estimated Creatinine Clearance: 33 mL/min (A) (by C-G formula based on SCr of 1.13 mg/dL (H)). Liver Function Tests: No results for input(s): AST, ALT, ALKPHOS, BILITOT, PROT, ALBUMIN in the last 168 hours. No results for input(s): LIPASE, AMYLASE in the last 168 hours. No results for input(s): AMMONIA in the last 168 hours. Coagulation Profile: Recent Labs  Lab 04/12/19 0536 04/13/19 0539 04/14/19 0532 04/15/19 0629 04/16/19 0550  INR 2.5* 2.7* 2.5* 2.1* 2.0*   Cardiac Enzymes: No results for input(s): CKTOTAL, CKMB, CKMBINDEX, TROPONINI in the last 168 hours. BNP (last 3 results) No results for input(s): PROBNP in the last 8760 hours. HbA1C: No results for input(s): HGBA1C in the last 72 hours. CBG: No results for input(s): GLUCAP in the last 168 hours. Lipid Profile: No results for input(s): CHOL, HDL, LDLCALC, TRIG, CHOLHDL, LDLDIRECT in the last 72 hours. Thyroid Function Tests: No results for input(s): TSH, T4TOTAL, FREET4, T3FREE, THYROIDAB in the last 72 hours. Anemia Panel: No results for input(s): VITAMINB12, FOLATE, FERRITIN, TIBC, IRON, RETICCTPCT in the last 72 hours. Sepsis Labs: No results for input(s): PROCALCITON, LATICACIDVEN in the last 168 hours.  Recent Results (from the past 240 hour(s))  Urine culture     Status: Abnormal   Collection Time: 04/07/19 12:12 PM   Specimen: Urine, Random  Result Value Ref Range Status   Specimen Description   Final    URINE, RANDOM Performed at [facility] [address], [place], [place] [address]    Special Requests   Final    NONE Performed at [facility], [place] [address] [place], [place] [address]    Culture  >=100,000 COLONIES/mL KLEBSIELLA PNEUMONIAE (A)  Final   Report Status 04/10/2019 FINAL  Final   Organism ID, Bacteria KLEBSIELLA PNEUMONIAE (A)  Final      Susceptibility   Klebsiella pneumoniae - MIC*    AMPICILLIN >=32 RESISTANT Resistant     CEFAZOLIN <=4 SENSITIVE Sensitive     CEFTRIAXONE <=1 SENSITIVE Sensitive     CIPROFLOXACIN <=0.25 SENSITIVE Sensitive     GENTAMICIN <=1 SENSITIVE Sensitive     IMIPENEM 0.5 SENSITIVE Sensitive     NITROFURANTOIN 64 INTERMEDIATE Intermediate     TRIMETH/SULFA <=20 SENSITIVE Sensitive     AMPICILLIN/SULBACTAM 8 SENSITIVE Sensitive     PIP/TAZO <=4 SENSITIVE Sensitive     Extended ESBL NEGATIVE Sensitive     * >=100,000 COLONIES/mL KLEBSIELLA PNEUMONIAE  SARS Coronavirus 2 [facility] order, Performed in [facility] hospital lab) Nasopharyngeal Nasopharyngeal Swab     Status: None   Collection Time: 04/07/19 10:30 PM   Specimen: Nasopharyngeal Swab  Result Value Ref Range Status   SARS Coronavirus 2 NEGATIVE NEGATIVE Final    Comment: (NOTE) If result is NEGATIVE SARS-CoV-2 target nucleic acids are NOT DETECTED. The SARS-CoV-2 RNA is generally detectable in upper and lower  respiratory specimens during the acute phase of infection. The  lowest  concentration of SARS-CoV-2 viral copies this assay can detect is 250  copies / mL. A negative result does not preclude SARS-CoV-2 infection  and should not be used as the sole basis for treatment or other  patient management decisions.  A negative result may occur with  improper specimen collection / handling, submission of specimen other  than nasopharyngeal swab, presence of viral mutation(s) within the  areas targeted by this assay, and inadequate number of viral copies  (<250 copies / mL). A negative result must be combined with clinical  observations, patient history, and epidemiological information. If result is POSITIVE SARS-CoV-2 target nucleic acids are DETECTED. The SARS-CoV-2 RNA is  generally detectable in upper and lower  respiratory specimens dur ing the acute phase of infection.  Positive  results are indicative of active infection with SARS-CoV-2.  Clinical  correlation with patient history and other diagnostic information is  necessary to determine patient infection status.  Positive results do  not rule out bacterial infection or co-infection with other viruses. If result is PRESUMPTIVE POSTIVE SARS-CoV-2 nucleic acids MAY BE PRESENT.   A presumptive positive result was obtained on the submitted specimen  and confirmed on repeat testing.  While 2019 novel coronavirus  (SARS-CoV-2) nucleic acids may be present in the submitted sample  additional confirmatory testing may be necessary for epidemiological  and / or clinical management purposes  to differentiate between  SARS-CoV-2 and other Sarbecovirus currently known to infect humans.  If clinically indicated additional testing with an alternate test  methodology [phone]) is advised. The SARS-CoV-2 RNA is generally  detectable in upper and lower respiratory sp ecimens during the acute  phase of infection. The expected result is Negative. Fact Sheet for Patients:  StrictlyIdeas.no Fact Sheet for Healthcare Providers: BankingDealers.co.za This test is not yet approved or cleared by the [place] FDA and has been authorized for detection and/or diagnosis of SARS-CoV-2 by FDA under an Emergency Use Authorization (EUA).  This EUA will remain in effect (meaning this test can be used) for the duration of the COVID-19 declaration under Section 564(b)(1) of the Act, 21 U.S.C. section 360bbb-3(b)(1), unless the authorization is terminated or revoked sooner. Performed at [facility], [place] [address], [place], [place] [address]   Novel Coronavirus, NAA (hospital order; send-out to ref lab)     Status: None   Collection Time: 04/14/19 12:13 PM    Specimen: Nasopharyngeal Swab; Respiratory  Result Value Ref Range Status   SARS-CoV-2, NAA NOT DETECTED NOT DETECTED Final    Comment: (NOTE) This nucleic acid amplification test was developed and its performance characteristics determined by [employer]. Nucleic acid amplification tests include PCR and TMA. This test has not been FDA cleared or approved. This test has been authorized by FDA under an Emergency Use Authorization (EUA). This test is only authorized for the duration of time the declaration that circumstances exist justifying the authorization of the emergency use of in vitro diagnostic tests for detection of SARS-CoV-2 virus and/or diagnosis of COVID-19 infection under section 564(b)(1) of the Act, 21 U.S.C. [id number]) (1), unless the authorization is terminated or revoked sooner. When diagnostic testing is negative, the possibility of a false negative result should be considered in the context of a patient's recent exposures and the presence of clinical signs and symptoms consistent with COVID-19. An individual without symptoms of COVID- 19 and who is not shedding SARS-CoV-2 vi rus would expect to have a negative (not detected) result in this assay.  Performed At: [facility] [place], [place] [id number] Rush Farmer MD [id number]    [place]  Final    Comment: Performed at [facility] [address], [place], [place] [address]         Radiology Studies: No results found.      Scheduled Meds: . atorvastatin  20 mg Oral q1800  . docusate sodium  100 mg Oral BID  . feeding supplement (ENSURE ENLIVE)  237 mL Oral BID BM  . furosemide  20 mg Oral Daily  . isosorbide mononitrate  15 mg Oral Daily  . metoprolol tartrate  37.5 mg Oral BID  . mometasone-formoterol  2 puff Inhalation BID  . polyethylene glycol  17 g Oral Daily  . QUEtiapine  50 mg Oral QHS  . verapamil  120 mg  Oral QHS  . warfarin  2.5 mg Oral q1800  . Warfarin - Pharmacist Dosing Inpatient   Does not apply q1800   Continuous Infusions:   LOS: 8 days   Time spent= 15 mins    Ankit Arsenio Loader, MD [facility]  If 7PM-7AM, please contact night-coverage [email or web address] 04/16/2019, 9:11 AM

## 2019-04-17 ENCOUNTER — Non-Acute Institutional Stay (SKILLED_NURSING_FACILITY): Payer: Medicare Other | Admitting: Internal Medicine

## 2019-04-17 ENCOUNTER — Inpatient Hospital Stay (HOSPITAL_COMMUNITY): Payer: Medicare Other

## 2019-04-17 DIAGNOSIS — R278 Other lack of coordination: Secondary | ICD-10-CM | POA: Diagnosis not present

## 2019-04-17 DIAGNOSIS — M6281 Muscle weakness (generalized): Secondary | ICD-10-CM | POA: Diagnosis not present

## 2019-04-17 DIAGNOSIS — N179 Acute kidney failure, unspecified: Secondary | ICD-10-CM | POA: Diagnosis not present

## 2019-04-17 DIAGNOSIS — N390 Urinary tract infection, site not specified: Secondary | ICD-10-CM | POA: Diagnosis not present

## 2019-04-17 DIAGNOSIS — I509 Heart failure, unspecified: Secondary | ICD-10-CM | POA: Diagnosis not present

## 2019-04-17 DIAGNOSIS — I129 Hypertensive chronic kidney disease with stage 1 through stage 4 chronic kidney disease, or unspecified chronic kidney disease: Secondary | ICD-10-CM | POA: Diagnosis not present

## 2019-04-17 DIAGNOSIS — I482 Chronic atrial fibrillation, unspecified: Secondary | ICD-10-CM | POA: Diagnosis not present

## 2019-04-17 DIAGNOSIS — N3090 Cystitis, unspecified without hematuria: Secondary | ICD-10-CM | POA: Diagnosis not present

## 2019-04-17 DIAGNOSIS — M5136 Other intervertebral disc degeneration, lumbar region: Secondary | ICD-10-CM | POA: Diagnosis not present

## 2019-04-17 DIAGNOSIS — I4891 Unspecified atrial fibrillation: Secondary | ICD-10-CM | POA: Diagnosis not present

## 2019-04-17 DIAGNOSIS — B961 Klebsiella pneumoniae [K. pneumoniae] as the cause of diseases classified elsewhere: Secondary | ICD-10-CM

## 2019-04-17 DIAGNOSIS — R05 Cough: Secondary | ICD-10-CM | POA: Diagnosis not present

## 2019-04-17 DIAGNOSIS — Z8673 Personal history of transient ischemic attack (TIA), and cerebral infarction without residual deficits: Secondary | ICD-10-CM | POA: Diagnosis not present

## 2019-04-17 DIAGNOSIS — M545 Low back pain: Secondary | ICD-10-CM | POA: Diagnosis not present

## 2019-04-17 DIAGNOSIS — G9341 Metabolic encephalopathy: Secondary | ICD-10-CM

## 2019-04-17 DIAGNOSIS — N189 Chronic kidney disease, unspecified: Secondary | ICD-10-CM | POA: Diagnosis not present

## 2019-04-17 DIAGNOSIS — R4689 Other symptoms and signs involving appearance and behavior: Secondary | ICD-10-CM | POA: Diagnosis not present

## 2019-04-17 DIAGNOSIS — R0602 Shortness of breath: Secondary | ICD-10-CM | POA: Diagnosis not present

## 2019-04-17 DIAGNOSIS — B9689 Other specified bacterial agents as the cause of diseases classified elsewhere: Secondary | ICD-10-CM

## 2019-04-17 DIAGNOSIS — R7989 Other specified abnormal findings of blood chemistry: Secondary | ICD-10-CM | POA: Diagnosis not present

## 2019-04-17 DIAGNOSIS — G8929 Other chronic pain: Secondary | ICD-10-CM | POA: Diagnosis not present

## 2019-04-17 DIAGNOSIS — R410 Disorientation, unspecified: Secondary | ICD-10-CM | POA: Diagnosis not present

## 2019-04-17 DIAGNOSIS — R600 Localized edema: Secondary | ICD-10-CM | POA: Diagnosis not present

## 2019-04-17 DIAGNOSIS — I5030 Unspecified diastolic (congestive) heart failure: Secondary | ICD-10-CM | POA: Diagnosis not present

## 2019-04-17 DIAGNOSIS — I5032 Chronic diastolic (congestive) heart failure: Secondary | ICD-10-CM | POA: Diagnosis not present

## 2019-04-17 DIAGNOSIS — Z8679 Personal history of other diseases of the circulatory system: Secondary | ICD-10-CM | POA: Diagnosis not present

## 2019-04-17 DIAGNOSIS — Z7401 Bed confinement status: Secondary | ICD-10-CM | POA: Diagnosis not present

## 2019-04-17 DIAGNOSIS — J069 Acute upper respiratory infection, unspecified: Secondary | ICD-10-CM | POA: Diagnosis not present

## 2019-04-17 DIAGNOSIS — I25118 Atherosclerotic heart disease of native coronary artery with other forms of angina pectoris: Secondary | ICD-10-CM

## 2019-04-17 DIAGNOSIS — E870 Hyperosmolality and hypernatremia: Secondary | ICD-10-CM | POA: Diagnosis not present

## 2019-04-17 DIAGNOSIS — N289 Disorder of kidney and ureter, unspecified: Secondary | ICD-10-CM | POA: Diagnosis not present

## 2019-04-17 DIAGNOSIS — I2510 Atherosclerotic heart disease of native coronary artery without angina pectoris: Secondary | ICD-10-CM | POA: Diagnosis not present

## 2019-04-17 DIAGNOSIS — R2681 Unsteadiness on feet: Secondary | ICD-10-CM | POA: Diagnosis not present

## 2019-04-17 DIAGNOSIS — I4811 Longstanding persistent atrial fibrillation: Secondary | ICD-10-CM | POA: Diagnosis not present

## 2019-04-17 DIAGNOSIS — D649 Anemia, unspecified: Secondary | ICD-10-CM | POA: Diagnosis not present

## 2019-04-17 DIAGNOSIS — Z87440 Personal history of urinary (tract) infections: Secondary | ICD-10-CM | POA: Diagnosis not present

## 2019-04-17 DIAGNOSIS — R569 Unspecified convulsions: Secondary | ICD-10-CM | POA: Diagnosis not present

## 2019-04-17 DIAGNOSIS — R41841 Cognitive communication deficit: Secondary | ICD-10-CM | POA: Diagnosis not present

## 2019-04-17 DIAGNOSIS — D696 Thrombocytopenia, unspecified: Secondary | ICD-10-CM | POA: Diagnosis not present

## 2019-04-17 DIAGNOSIS — I69328 Other speech and language deficits following cerebral infarction: Secondary | ICD-10-CM | POA: Diagnosis not present

## 2019-04-17 DIAGNOSIS — R791 Abnormal coagulation profile: Secondary | ICD-10-CM | POA: Diagnosis not present

## 2019-04-17 DIAGNOSIS — R1312 Dysphagia, oropharyngeal phase: Secondary | ICD-10-CM | POA: Diagnosis not present

## 2019-04-17 DIAGNOSIS — Z7901 Long term (current) use of anticoagulants: Secondary | ICD-10-CM | POA: Diagnosis not present

## 2019-04-17 DIAGNOSIS — E43 Unspecified severe protein-calorie malnutrition: Secondary | ICD-10-CM | POA: Diagnosis not present

## 2019-04-17 DIAGNOSIS — R0902 Hypoxemia: Secondary | ICD-10-CM | POA: Diagnosis not present

## 2019-04-17 DIAGNOSIS — I69311 Memory deficit following cerebral infarction: Secondary | ICD-10-CM | POA: Diagnosis not present

## 2019-04-17 DIAGNOSIS — E785 Hyperlipidemia, unspecified: Secondary | ICD-10-CM

## 2019-04-17 DIAGNOSIS — N183 Chronic kidney disease, stage 3 unspecified: Secondary | ICD-10-CM | POA: Diagnosis not present

## 2019-04-17 DIAGNOSIS — M2550 Pain in unspecified joint: Secondary | ICD-10-CM | POA: Diagnosis not present

## 2019-04-17 DIAGNOSIS — Z9181 History of falling: Secondary | ICD-10-CM | POA: Diagnosis not present

## 2019-04-17 LAB — PROTIME-INR
INR: 2.1 — ABNORMAL HIGH (ref 0.8–1.2)
Prothrombin Time: 23 seconds — ABNORMAL HIGH (ref 11.4–15.2)

## 2019-04-17 NOTE — Progress Notes (Signed)
ANTICOAGULATION CONSULT NOTE - Follow Up Consult  Pharmacy Consult for warfarin Indication: hx atrial fibrillation  No Known Allergies  Patient Measurements: Height: 5\' 3"  (160 cm) Weight: 177 lb 4 oz (80.4 kg) IBW/kg (Calculated) : 52.4 Heparin Dosing Weight:   Vital Signs: Temp: 97.6 F (36.4 C) (10/12 0533) Temp Source: Oral (10/12 0533) BP: 152/63 (10/12 0533) Pulse Rate: 74 (10/12 0533)  Labs: Recent Labs    04/15/19 0629 04/16/19 0550 04/17/19 0528  LABPROT 23.0* 22.7* 23.0*  INR 2.1* 2.0* 2.1*   Estimated Creatinine Clearance: 33.2 mL/min (A) (by C-G formula based on SCr of 1.13 mg/dL (H)).  Medications:  - PTA warfarin regimen: 2.5 mg daily (last dose taken on 10/1)  Assessment: Patient is a 85 y.o F with hx afib on warfarin PTA, presented to the ED on 10/2 with c/o AMS, back and hip pain.  Warfarin resumed on admission.  Today, 04/17/2019: - INR 2.1 therapeutic, dose not charted 10/8, charted twice 10/9 - no bleeding documented - no significant drug-drug intxns  Goal of Therapy:  INR 2-3 Monitor platelets by anticoagulation protocol: Yes   Plan:  -continue home dose of Warfarin 2.5 mg po daily -INR decreased to MWF only - monitor for s/s bleeding  Minda Ditto PharmD Pager [phone] 04/17/2019, 8:14 AM

## 2019-04-17 NOTE — Discharge Summary (Signed)
Physician Discharge Summary  [name] [id number] DOB: 02-01-1927 DOA: 04/07/2019  PCP: Colon Branch, MD  Admit date: 04/07/2019 Discharge date: 04/17/2019   Disposition: Rehab  Recommendations for Outpatient Follow-up:  1. Follow up with PCP in 1-2 weeks 2. Please obtain BMP/CBC in one week your next doctors visit.     Discharge Condition: Stable CODE STATUS: Full code Diet recommendation: 2 g salt  Brief/Interim Summary: 85 year old with history of atrial fibrillation/pulmonary embolism on Coumadin, essential hypertension, hyperlipidemia, CVA, CKD stage III, asthma, colon cancer came to the hospital with complains of confusion and lower back pain.  This was causing some difficulty walking.  UA was suggestive of urinary tract infection with Klebsiella treated with Rocephin.  X-ray of the lumbar spine showed degenerative disc disease.  He completed a 5-day course of IV antibiotic.  Initially Lasix was held due to dehydration and UTI/AKI.  Resume Lipitor at discharge.  Stable for discharge today.    Discharge Diagnoses:  Principal Problem:   Acute kidney injury superimposed on chronic kidney disease ([place]) Active Problems:   Essential hypertension   ATRIAL FIBRILLATION     Dyslipidemia   History of CVA (cerebrovascular accident)   Thrombocytopenia (HCC)   Asthma   Chronic diastolic (congestive) heart failure (HCC)   Back pain  Metabolic encephalopathy secondary to Klebsiella urinary tract infection, present on admission -Treated with 5 days of IV Rocephin.  Remains afebrile.  Acute kidney injury on CKD stage III -Baseline 1.4.  Resolved.  Chronic diastolic congestive heart failure, ejection fraction 60 to 65% - Initially Lasix was held.  Now appears to be euvolemic.  Resume Lasix at 20 mg daily  Chronic low back pain - X-ray is negative.  Pain control.  Permanent atrial fibrillation -Rate controlled with metoprolol and verapamil -On Coumadin  History of  CVA -On aspirin, statin and Coumadin  Essential hypertension -Continue home meds  Constipation -Stool softeners.   Consultations:  None  Subjective: No acute events overnight.  States she feels better.  Discharge Exam: Vitals:   04/17/19 0533 04/17/19 0822  BP: (!) 152/63   Pulse: 74   Resp: 18   Temp: 97.6 F (36.4 C)   SpO2: 98% 99%   Vitals:   04/16/19 2012 04/16/19 2126 04/17/19 0533 04/17/19 0822  BP:  (!) 151/89 (!) 152/63   Pulse:  86 74   Resp:  16 18   Temp:  97.9 F (36.6 C) 97.6 F (36.4 C)   TempSrc:  Oral Oral   SpO2: 97% 98% 98% 99%  Weight:   80.4 kg   Height:        General: Pt is alert, awake, not in acute distress Cardiovascular: RRR, S1/S2 +, no rubs, no gallops Respiratory: CTA bilaterally, no wheezing, no rhonchi Abdominal: Soft, NT, ND, bowel sounds + Extremities: no edema, no cyanosis  Discharge Instructions   Allergies as of 04/17/2019   No Known Allergies     Medication List    STOP taking these medications   nitroGLYCERIN 0.4 MG SL tablet Commonly known as: NITROSTAT     TAKE these medications   acetaminophen 500 MG tablet Commonly known as: TYLENOL Take 1,000 mg by mouth every 8 (eight) hours as needed for headache (pain).   aspirin EC 81 MG tablet Take 81 mg by mouth daily.   atorvastatin 20 MG tablet Commonly known as: LIPITOR Take 1 tablet (20 mg total) by mouth daily.   calcium-vitamin D 500-200 MG-UNIT tablet Commonly known as:  OSCAL WITH D Take 1 tablet by mouth daily with breakfast.   Dulera 100-5 MCG/ACT Aero Generic drug: mometasone-formoterol Inhale 2 puffs into the lungs 2 (two) times daily.   furosemide 80 MG tablet Commonly known as: LASIX Take 1 tablet (80 mg total) by mouth daily. What changed:   how much to take  when to take this  additional instructions   isosorbide mononitrate 30 MG 24 hr tablet Commonly known as: IMDUR Take 0.5 tablets (15 mg total) by mouth daily.    loratadine 10 MG tablet Commonly known as: CLARITIN Take 1 tablet (10 mg total) by mouth daily as needed (seasonal allergies).   metoprolol tartrate 25 MG tablet Commonly known as: LOPRESSOR Take 1.5 tablets (37.5 mg total) by mouth 2 (two) times daily.   multivitamin with minerals Tabs tablet Take 1 tablet by mouth daily.   polyethylene glycol 17 g packet Commonly known as: MIRALAX / GLYCOLAX Take 17 g by mouth daily as needed.   albuterol (2.5 MG/3ML) 0.083% nebulizer solution Commonly known as: PROVENTIL Take 2.5 mg by nebulization every 6 (six) hours as needed for wheezing or shortness of breath.   ProAir HFA 108 (90 Base) MCG/ACT inhaler Generic drug: albuterol Inhale 2 puffs into the lungs every 6 (six) hours as needed for wheezing or shortness of breath.   promethazine 12.5 MG tablet Commonly known as: PHENERGAN Take 1 tablet (12.5 mg total) by mouth 3 (three) times daily as needed for nausea or vomiting.   QUEtiapine 25 MG tablet Commonly known as: SEROQUEL Take 2 tablets (50 mg total) by mouth at bedtime.   tamsulosin 0.4 MG Caps capsule Commonly known as: FLOMAX Take 1 capsule (0.4 mg total) by mouth daily.   traMADol 50 MG tablet Commonly known as: ULTRAM Take 1 tablet (50 mg total) by mouth every 6 (six) hours as needed for up to 7 days for moderate pain.   verapamil 120 MG 24 hr capsule Commonly known as: VERELAN PM Take 120 mg by mouth at bedtime.   warfarin 2.5 MG tablet Commonly known as: COUMADIN Take as directed. If you are unsure how to take this medication, talk to your nurse or doctor. Original instructions: Take 2.5 mg by mouth daily.      Follow-up Information    Colon Branch, MD. Schedule an appointment as soon as possible for a visit in 1 week(s).   Specialty: Internal Medicine Contact information: [place] [address] [phone]          No Known Allergies  You were cared for by a hospitalist  during your hospital stay. If you have any questions about your discharge medications or the care you received while you were in the hospital after you are discharged, you can call the unit and asked to speak with the hospitalist on call if the hospitalist that took care of you is not available. Once you are discharged, your primary care physician will handle any further medical issues. Please note that no refills for any discharge medications will be authorized once you are discharged, as it is imperative that you return to your primary care physician (or establish a relationship with a primary care physician if you do not have one) for your aftercare needs so that they can reassess your need for medications and monitor your lab values.   Procedures/Studies: Dg Lumbar Spine Complete  Result Date: 04/07/2019 CLINICAL DATA:  Pain EXAM: LUMBAR SPINE - COMPLETE 4+ VIEW COMPARISON:  None. FINDINGS: Leftward scoliosis in the lumbar spine. Diffuse osteopenia. Diffuse degenerative disc and facet disease. No fracture. Aortic calcifications. No aneurysm. IVC filter remains in place, unchanged. IMPRESSION: Leftward scoliosis. Advanced degenerative disc and facet disease diffusely. No acute bony abnormality. Osteopenia. Electronically Signed   By: Rolm Baptise M.D.   On: 04/07/2019 13:36   Dg Chest Port 1 View  Result Date: 04/12/2019 CLINICAL DATA:  Atrial fibrillation EXAM: PORTABLE CHEST 1 VIEW COMPARISON:  01/20/2019 FINDINGS: Cardiac shadow is enlarged but stable. Aortic calcifications are again seen. The lungs are well aerated bilaterally. No focal infiltrate or sizable effusion is seen. No bony abnormality is noted. IMPRESSION: No acute abnormality noted. Electronically Signed   By: Inez Catalina M.D.   On: 04/12/2019 20:14   Ct Renal Stone Study  Result Date: 04/07/2019 CLINICAL DATA:  Bilateral hip, back, flank pain for several days. EXAM: CT ABDOMEN AND PELVIS WITHOUT CONTRAST TECHNIQUE: Multidetector CT  imaging of the abdomen and pelvis was performed following the standard protocol without IV contrast. COMPARISON:  08/06/2014 CT abdomen/pelvis. FINDINGS: Lower chest: Scattered small parenchymal bands in the dependent lung bases bilaterally, compatible with mild scarring or atelectasis. Cardiomegaly. Coronary atherosclerosis. Hepatobiliary: Normal liver size. No liver mass. Normal gallbladder with no radiopaque cholelithiasis. No biliary ductal dilatation. Pancreas: Normal, with no mass or duct dilation. Spleen: Normal size. No mass. Adrenals/Urinary Tract: Normal adrenals. Punctate nonobstructing lower left renal stone. No additional renal stones. No hydronephrosis. Slightly hyperdense 1.8 cm renal cortical lesion in the anterior interpolar left kidney with density 53 HU (series 3/image 25), stable since 08/06/2014 CT. No additional contour deforming renal lesions. Bladder obscured by streak artifact from left hip hardware and nondistended. No gross bladder abnormality. Stomach/Bowel: Normal non-distended stomach. Apparent right hemicolectomy with ileocolic anastomosis in the midline abdomen. No small bowel dilatation or wall thickening. Moderate stool throughout the remnant large-bowel. No large bowel wall thickening, significant diverticulosis or acute pericolonic fat stranding. Vascular/Lymphatic: Atherosclerotic nonaneurysmal abdominal aorta. Infrarenal IVC filter is stable in position. No pathologically enlarged lymph nodes in the abdomen or pelvis. Reproductive: Status post hysterectomy, with no abnormal findings at the vaginal cuff. No adnexal mass. Other: No pneumoperitoneum, ascites or focal fluid collection. Musculoskeletal: No aggressive appearing focal osseous lesions. Marked thoracolumbar spondylosis. Left total hip arthroplasty. IMPRESSION: 1. No acute abnormality. Apparent right hemicolectomy. No evidence of bowel obstruction or acute bowel inflammation. Moderate stool throughout the remnant  large-bowel, suggesting constipation. 2. Punctate nonobstructing lower left renal stone. No hydronephrosis. No ureteral or bladder stones. 3. Slightly hyperdense 1.8 cm renal cortical lesion in the anterior interpolar left kidney. While technically indeterminate, lesion is stable since 2016 CT, probably a benign hemorrhagic/proteinaceous renal cyst. 4. Aortic Atherosclerosis (ICD10-I70.0). Electronically Signed   By: Ilona Sorrel M.D.   On: 04/07/2019 19:50   Dg Hips Bilat W Or Wo Pelvis 2 Views  Result Date: 04/07/2019 CLINICAL DATA:  Bilateral hip pain without known injury. EXAM: DG HIP (WITH OR WITHOUT PELVIS) 2V BILAT COMPARISON:  Radiographs of January 20, 2019. FINDINGS: Status post left total hip arthroplasty. No acute fracture or dislocation is noted. Right hip is unremarkable. IMPRESSION: No acute abnormality seen involving either hip. Status post left total hip arthroplasty. Electronically Signed   By: Marijo Conception M.D.   On: 04/07/2019 13:40      The results of significant diagnostics from this hospitalization (including imaging, microbiology, ancillary and laboratory) are listed below for reference.     Microbiology: Recent Results (from  the past 240 hour(s))  SARS Coronavirus 2 [facility] order, Performed in [facility] hospital lab) Nasopharyngeal Nasopharyngeal Swab     Status: None   Collection Time: 04/07/19 10:30 PM   Specimen: Nasopharyngeal Swab  Result Value Ref Range Status   SARS Coronavirus 2 NEGATIVE NEGATIVE Final    Comment: (NOTE) If result is NEGATIVE SARS-CoV-2 target nucleic acids are NOT DETECTED. The SARS-CoV-2 RNA is generally detectable in upper and lower  respiratory specimens during the acute phase of infection. The lowest  concentration of SARS-CoV-2 viral copies this assay can detect is 250  copies / mL. A negative result does not preclude SARS-CoV-2 infection  and should not be used as the sole basis for treatment or other  patient management  decisions.  A negative result may occur with  improper specimen collection / handling, submission of specimen other  than nasopharyngeal swab, presence of viral mutation(s) within the  areas targeted by this assay, and inadequate number of viral copies  (<250 copies / mL). A negative result must be combined with clinical  observations, patient history, and epidemiological information. If result is POSITIVE SARS-CoV-2 target nucleic acids are DETECTED. The SARS-CoV-2 RNA is generally detectable in upper and lower  respiratory specimens dur ing the acute phase of infection.  Positive  results are indicative of active infection with SARS-CoV-2.  Clinical  correlation with patient history and other diagnostic information is  necessary to determine patient infection status.  Positive results do  not rule out bacterial infection or co-infection with other viruses. If result is PRESUMPTIVE POSTIVE SARS-CoV-2 nucleic acids MAY BE PRESENT.   A presumptive positive result was obtained on the submitted specimen  and confirmed on repeat testing.  While 2019 novel coronavirus  (SARS-CoV-2) nucleic acids may be present in the submitted sample  additional confirmatory testing may be necessary for epidemiological  and / or clinical management purposes  to differentiate between  SARS-CoV-2 and other Sarbecovirus currently known to infect humans.  If clinically indicated additional testing with an alternate test  methodology [phone]) is advised. The SARS-CoV-2 RNA is generally  detectable in upper and lower respiratory sp ecimens during the acute  phase of infection. The expected result is Negative. Fact Sheet for Patients:  StrictlyIdeas.no Fact Sheet for Healthcare Providers: BankingDealers.co.za This test is not yet approved or cleared by the [place] FDA and has been authorized for detection and/or diagnosis of SARS-CoV-2 by FDA under an  Emergency Use Authorization (EUA).  This EUA will remain in effect (meaning this test can be used) for the duration of the COVID-19 declaration under Section 564(b)(1) of the Act, 21 U.S.C. section 360bbb-3(b)(1), unless the authorization is terminated or revoked sooner. Performed at [facility], [place] [address] [place], [place] [address]   Novel Coronavirus, NAA (hospital order; send-out to ref lab)     Status: None   Collection Time: 04/14/19 12:13 PM   Specimen: Nasopharyngeal Swab; Respiratory  Result Value Ref Range Status   SARS-CoV-2, NAA NOT DETECTED NOT DETECTED Final    Comment: (NOTE) This nucleic acid amplification test was developed and its performance characteristics determined by [employer]. Nucleic acid amplification tests include PCR and TMA. This test has not been FDA cleared or approved. This test has been authorized by FDA under an Emergency Use Authorization (EUA). This test is only authorized for the duration of time the declaration that circumstances exist justifying the authorization of the emergency use of in vitro diagnostic tests for detection  of SARS-CoV-2 virus and/or diagnosis of COVID-19 infection under section 564(b)(1) of the Act, 21 U.S.C. [id number]) (1), unless the authorization is terminated or revoked sooner. When diagnostic testing is negative, the possibility of a false negative result should be considered in the context of a patient's recent exposures and the presence of clinical signs and symptoms consistent with COVID-19. An individual without symptoms of COVID- 19 and who is not shedding SARS-CoV-2 vi rus would expect to have a negative (not detected) result in this assay. Performed At: [facility] [place], [place] [id number] Rush Farmer MD [id number]    [place]  Final    Comment: Performed at [place] [address],  [place],  [address]     Labs: BNP (last 3 results) Recent Labs    01/20/19 0941 04/14/19 0532  BNP 852.0* 123XX123*   Basic Metabolic Panel: Recent Labs  Lab 04/11/19 0556 04/12/19 0536 04/14/19 0532  NA 148* 147* 145  K 4.3 4.1 3.8  CL 111 108 109  CO2 27 25 24   GLUCOSE 87 93 103*  BUN 31* 29* 26*  CREATININE 1.28* 1.26* 1.13*  CALCIUM 9.0 9.3 9.4   Liver Function Tests: No results for input(s): AST, ALT, ALKPHOS, BILITOT, PROT, ALBUMIN in the last 168 hours. No results for input(s): LIPASE, AMYLASE in the last 168 hours. No results for input(s): AMMONIA in the last 168 hours. CBC: Recent Labs  Lab 04/12/19 0536  WBC 5.3  HGB 12.9  HCT 42.0  MCV 100.0  PLT 70*   Cardiac Enzymes: No results for input(s): CKTOTAL, CKMB, CKMBINDEX, TROPONINI in the last 168 hours. BNP: Invalid input(s): POCBNP CBG: No results for input(s): GLUCAP in the last 168 hours. D-Dimer No results for input(s): DDIMER in the last 72 hours. Hgb A1c No results for input(s): HGBA1C in the last 72 hours. Lipid Profile No results for input(s): CHOL, HDL, LDLCALC, TRIG, CHOLHDL, LDLDIRECT in the last 72 hours. Thyroid function studies No results for input(s): TSH, T4TOTAL, T3FREE, THYROIDAB in the last 72 hours.  Invalid input(s): FREET3 Anemia work up No results for input(s): VITAMINB12, FOLATE, FERRITIN, TIBC, IRON, RETICCTPCT in the last 72 hours. Urinalysis    Component Value Date/Time   COLORURINE YELLOW 04/07/2019 2050   APPEARANCEUR HAZY (A) 04/07/2019 2050   APPEARANCEUR Cloudy (A) 01/13/2018 1619   LABSPEC 1.008 04/07/2019 2050   PHURINE 6.0 04/07/2019 2050   GLUCOSEU NEGATIVE 04/07/2019 2050   GLUCOSEU NEGATIVE 09/28/2018 0939   HGBUR NEGATIVE 04/07/2019 2050   [place] NEGATIVE 04/07/2019 2050   BILIRUBINUR Negative 01/13/2018 1619   KETONESUR NEGATIVE 04/07/2019 2050   PROTEINUR NEGATIVE 04/07/2019 2050   UROBILINOGEN 0.2 09/28/2018 0939   NITRITE NEGATIVE  04/07/2019 2050   LEUKOCYTESUR LARGE (A) 04/07/2019 2050   Sepsis Labs Invalid input(s): PROCALCITONIN,  WBC,  LACTICIDVEN Microbiology Recent Results (from the past 240 hour(s))  SARS Coronavirus 2 [facility] order, Performed in [facility] hospital lab) Nasopharyngeal Nasopharyngeal Swab     Status: None   Collection Time: 04/07/19 10:30 PM   Specimen: Nasopharyngeal Swab  Result Value Ref Range Status   SARS Coronavirus 2 NEGATIVE NEGATIVE Final    Comment: (NOTE) If result is NEGATIVE SARS-CoV-2 target nucleic acids are NOT DETECTED. The SARS-CoV-2 RNA is generally detectable in upper and lower  respiratory specimens during the acute phase of infection. The lowest  concentration of SARS-CoV-2 viral copies this assay can detect is 250  copies / mL. A negative  result does not preclude SARS-CoV-2 infection  and should not be used as the sole basis for treatment or other  patient management decisions.  A negative result may occur with  improper specimen collection / handling, submission of specimen other  than nasopharyngeal swab, presence of viral mutation(s) within the  areas targeted by this assay, and inadequate number of viral copies  (<250 copies / mL). A negative result must be combined with clinical  observations, patient history, and epidemiological information. If result is POSITIVE SARS-CoV-2 target nucleic acids are DETECTED. The SARS-CoV-2 RNA is generally detectable in upper and lower  respiratory specimens dur ing the acute phase of infection.  Positive  results are indicative of active infection with SARS-CoV-2.  Clinical  correlation with patient history and other diagnostic information is  necessary to determine patient infection status.  Positive results do  not rule out bacterial infection or co-infection with other viruses. If result is PRESUMPTIVE POSTIVE SARS-CoV-2 nucleic acids MAY BE PRESENT.   A presumptive positive result was obtained on the submitted  specimen  and confirmed on repeat testing.  While 2019 novel coronavirus  (SARS-CoV-2) nucleic acids may be present in the submitted sample  additional confirmatory testing may be necessary for epidemiological  and / or clinical management purposes  to differentiate between  SARS-CoV-2 and other Sarbecovirus currently known to infect humans.  If clinically indicated additional testing with an alternate test  methodology [phone]) is advised. The SARS-CoV-2 RNA is generally  detectable in upper and lower respiratory sp ecimens during the acute  phase of infection. The expected result is Negative. Fact Sheet for Patients:  StrictlyIdeas.no Fact Sheet for Healthcare Providers: BankingDealers.co.za This test is not yet approved or cleared by the [place] FDA and has been authorized for detection and/or diagnosis of SARS-CoV-2 by FDA under an Emergency Use Authorization (EUA).  This EUA will remain in effect (meaning this test can be used) for the duration of the COVID-19 declaration under Section 564(b)(1) of the Act, 21 U.S.C. section 360bbb-3(b)(1), unless the authorization is terminated or revoked sooner. Performed at [facility], [place] [address], [place], [place] [address]   Novel Coronavirus, NAA (hospital order; send-out to ref lab)     Status: None   Collection Time: 04/14/19 12:13 PM   Specimen: Nasopharyngeal Swab; Respiratory  Result Value Ref Range Status   SARS-CoV-2, NAA NOT DETECTED NOT DETECTED Final    Comment: (NOTE) This nucleic acid amplification test was developed and its performance characteristics determined by [employer], [employer]. Nucleic acid amplification tests include PCR and TMA. This test has not been FDA cleared or approved. This test has been authorized by FDA under an Emergency Use Authorization (EUA). This test is only authorized for the duration of time the declaration  that circumstances exist justifying the authorization of the emergency use of in vitro diagnostic tests for detection of SARS-CoV-2 virus and/or diagnosis of COVID-19 infection under section 564(b)(1) of the Act, 21 U.S.C. [id number]) (1), unless the authorization is terminated or revoked sooner. When diagnostic testing is negative, the possibility of a false negative result should be considered in the context of a patient's recent exposures and the presence of clinical signs and symptoms consistent with COVID-19. An individual without symptoms of COVID- 19 and who is not shedding SARS-CoV-2 vi rus would expect to have a negative (not detected) result in this assay. Performed At: [facility] [address] [place], [place] [id number] Rush Farmer MD [id number]  Source NASOPHARYNGEAL  Final    Comment: Performed at [facility], [place] [address] [place], [place] [address]     Time coordinating discharge:  I have spent 35 minutes face to face with the patient and on the ward discussing the patients care, assessment, plan and disposition with other care givers. >50% of the time was devoted counseling the patient about the risks and benefits of treatment/Discharge disposition and coordinating care.   SIGNED:   Damita Lack, MD  [facility] 04/17/2019, 12:29 PM   If 7PM-7AM, please contact night-coverage [email or web address]

## 2019-04-17 NOTE — TOC Transition Note (Signed)
Transition of Care [facility]) - CM/SW Discharge Note   Patient Details  Name: SHARLET STEINBRECHER MRN: [id number] Date of Birth: 04/30/1927  Transition of Care [facility]) CM/SW Contact:  Nila Nephew, LCSW Phone Number: [phone] 04/17/2019, 1:23 PM   Clinical Narrative:   Pt admitting to [facility] room 511.  Report [phone]. PTAR transportation arranged, updated pt's daughter.    Final next level of care: Skilled Nursing Facility Barriers to Discharge: Barriers Resolved   Patient Goals and CMS Choice Patient states their goals for this hospitalization and ongoing recovery are:: therapy CMS Medicare.gov Compare Post Acute Care list provided to:: Other (Comment Required)(daughter) Choice offered to / list presented to : Adult Children  Discharge Placement              Patient [facility] bed at: [facility] Patient to be transferred to facility by: [facility] Name of family member notified: daughter [name] Patient and family notified of of transfer: 04/17/19  Discharge Plan and Services In-house Referral: Clinical Social Work   Post Acute Care Choice: [facility]                               Social Determinants of Health (SDOH) Interventions     Readmission Risk Interventions No flowsheet data found.

## 2019-04-18 ENCOUNTER — Encounter: Payer: Self-pay | Admitting: Internal Medicine

## 2019-04-18 ENCOUNTER — Non-Acute Institutional Stay (SKILLED_NURSING_FACILITY): Payer: Medicare Other | Admitting: Internal Medicine

## 2019-04-18 DIAGNOSIS — M545 Low back pain, unspecified: Secondary | ICD-10-CM

## 2019-04-18 DIAGNOSIS — N390 Urinary tract infection, site not specified: Secondary | ICD-10-CM

## 2019-04-18 DIAGNOSIS — Z8673 Personal history of transient ischemic attack (TIA), and cerebral infarction without residual deficits: Secondary | ICD-10-CM | POA: Diagnosis not present

## 2019-04-18 DIAGNOSIS — I509 Heart failure, unspecified: Secondary | ICD-10-CM

## 2019-04-18 DIAGNOSIS — N183 Chronic kidney disease, stage 3 unspecified: Secondary | ICD-10-CM

## 2019-04-18 DIAGNOSIS — G8929 Other chronic pain: Secondary | ICD-10-CM | POA: Diagnosis not present

## 2019-04-18 DIAGNOSIS — N179 Acute kidney failure, unspecified: Secondary | ICD-10-CM | POA: Diagnosis not present

## 2019-04-18 NOTE — Progress Notes (Signed)
Location:  [facility] Room Number: 511-P Place of Service:  SNF [id number]) Provider:  Granville Lewis, PA-C  Colon Branch, MD  Patient Care Team: Colon Branch, MD as PCP - General (Internal Medicine)  Extended Emergency Contact Information Primary Emergency Contact: [name] Address: [address]          [place], [place] [address] [name] of [place] Phone: [phone] Mobile Phone: [phone] Relation: Daughter  Code Status:  Full Code Goals of care: Advanced Directive information Advanced Directives 04/08/2019  Does Patient Have a Medical Advance Directive? Yes  Type of Advance Directive [place]  Does patient want to make changes to medical advance directive? No - Patient declined  Copy of [place] in Chart? -  Would patient like information on creating a medical advance directive? -     Chief Complaint  Patient presents with   Hospitalization Follow-up    Patient seen S/P [facility] admission 10/2-10/12/20 for acute kidney injury     HPI:  Pt is a 85 y.o. female seen today for hospital followup.  For hospitalization with diagnosis of acute kidney injury with history of UTI and increased confusion.  She has a history of atrial fibrillation pulmonary embolism on chronic Coumadin as well as hypertension hyperlipidemia history of CVA chronic kidney disease stage III as well as asthma and colon cancer.  She came into the hospital with increased confusion and complaints of lower back pain.  She also has some difficulty walking.  Urine culture did grow out Klebsiella which was treated with IV Rocephin.  X-ray of the lumbar spine show degenerative disc disease.  She completed a 5-day course of IV antibiotics.  Initially her Lasix was held because of dehydration with a creatinine of greater than 2-but this has since been restarted.  Creatinine at discharge was 1.13 which actually appears to  be possibly improved from her baseline.  Currently she is lying in bed comfortably vital signs are stable she does not have any complaints she appears to be usual cheerful self-she says she does have somewhat of a poor appetite-and p.o. intake will have to be encouraged she does say she takes her fluids well     Past Medical History:  Diagnosis Date   Acute encephalopathy    Acute renal failure superimposed on stage 3 chronic kidney disease (HCC)    Anemia    Atrial fibrillation ([place])    Colon cancer ([place]) 12/11   Colon cancer ([place]) 2011   Diarrhea    DVT (deep venous thrombosis) ([place]) 11/06   Elevated brain natriuretic peptide (BNP) level    Frequent headaches    Heart murmur    Hyperkalemia    Hypertension    Pulmonary embolism ([place]) 11/06   Seizures ([place])    Stroke ([place]) 11/30/2014   Urine retention 01/2019   Past Surgical History:  Procedure Laterality Date   APPENDECTOMY  1952   COLECTOMY  06/2010   partial, Dr. Donne Hazel complicated by LLE CVT; S/P IVC umbrella & anemia   HIP FRACTURE SURGERY  2006   Trauma   TOTAL ABDOMINAL HYSTERECTOMY W/ BILATERAL SALPINGOOPHORECTOMY  1973   For Fibroids   TOTAL HIP ARTHROPLASTY  01/03/07   Left hip replacement.   VENA CAVA FILTER PLACEMENT  06/28/2010    No Known Allergies  Outpatient Encounter Medications as of 04/18/2019  Medication Sig   acetaminophen (TYLENOL) 500 MG tablet Take 1,000 mg by mouth every  8 (eight) hours as needed for headache (pain).    albuterol (PROVENTIL) (2.5 MG/3ML) 0.083% nebulizer solution Take 2.5 mg by nebulization every 6 (six) hours as needed for wheezing or shortness of breath.   aspirin EC 81 MG tablet Take 81 mg by mouth daily.   atorvastatin (LIPITOR) 20 MG tablet Take 1 tablet (20 mg total) by mouth daily.   calcium-vitamin D (OSCAL WITH D) 500-200 MG-UNIT tablet Take 1 tablet by mouth daily with breakfast.   furosemide (LASIX) 80 MG tablet Take 1 tablet (80 mg  total) by mouth daily.   isosorbide mononitrate (IMDUR) 30 MG 24 hr tablet Take 0.5 tablets (15 mg total) by mouth daily.   loratadine (CLARITIN) 10 MG tablet Take 1 tablet (10 mg total) by mouth daily as needed (seasonal allergies).   metoprolol tartrate (LOPRESSOR) 25 MG tablet Take 1.5 tablets (37.5 mg total) by mouth 2 (two) times daily.   mometasone-formoterol (DULERA) 100-5 MCG/ACT AERO Inhale 2 puffs into the lungs 2 (two) times daily.   Multiple Vitamin (MULTIVITAMIN WITH MINERALS) TABS tablet Take 1 tablet by mouth daily.   polyethylene glycol (MIRALAX / GLYCOLAX) packet Take 17 g by mouth daily as needed.   PROAIR HFA 108 (90 Base) MCG/ACT inhaler Inhale 2 puffs into the lungs every 6 (six) hours as needed for wheezing or shortness of breath.   promethazine (PHENERGAN) 12.5 MG tablet Take 1 tablet (12.5 mg total) by mouth 3 (three) times daily as needed for nausea or vomiting.   QUEtiapine (SEROQUEL) 25 MG tablet Take 2 tablets (50 mg total) by mouth at bedtime.   tamsulosin (FLOMAX) 0.4 MG CAPS capsule Take 1 capsule (0.4 mg total) by mouth daily.   traMADol (ULTRAM) 50 MG tablet Take 1 tablet (50 mg total) by mouth every 6 (six) hours as needed for up to 7 days for moderate pain.   verapamil (VERELAN PM) 120 MG 24 hr capsule Take 120 mg by mouth at bedtime.   warfarin (COUMADIN) 2.5 MG tablet Take 2.5 mg by mouth daily.    No facility-administered encounter medications on file as of 04/18/2019.     Review of Systems   General he is not complaining of any fever or chills.  Skin does not complain of increased bruising or rashes or itching.  Head ears eyes nose mouth and throat does not complain of visual changes or sore throat.  Respiratory is not complaining of being short of breath or having a cough.  Cardiac does not complain of chest pain or edema.  GI does not complain of abdominal discomfort nausea vomiting diarrhea constipation or trouble swallowing-just  says she does not have much of an appetite.  GU does not complain of dysuria she has completed treatment for UTI.  Musculoskeletal has a history of chronic low back pain at times will have that she does have an order for tramadol and  Tylenol as needed.  Neurologic does not complain of dizziness headache or numbness or syncope.  IPsych does have a history of some cognitive impairment continues on Seroquel-she does not really complain to be depressed or anxious this afternoon.    Immunization History  Administered Date(s) Administered   Fluad Quad(high Dose 65+) 03/07/2019   Influenza Split 03/30/2012   Influenza Whole 04/30/2006, 04/20/2008, 04/15/2009   Influenza, High Dose Seasonal PF 04/20/2013, 03/27/2015, 03/31/2016, 04/22/2017, 04/20/2018   Influenza,inj,Quad PF,6+ Mos 05/23/2014   Pneumococcal Conjugate-13 04/24/2015   Pneumococcal Polysaccharide-23 07/06/2009   Td 10/30/2016   Pertinent  Health Maintenance Due  Topic Date Due   DEXA SCAN  02/27/2020 (Originally 11/26/1993)   INFLUENZA VACCINE  Completed   PNA vac Low Risk Adult  Completed   Fall Risk  07/22/2018 01/18/2018 10/30/2016 08/14/2015 04/24/2015  Falls in the past year? 1 No No No No  Number falls in past yr: 1 - - - -  Injury with Fall? 0 - - - -   Functional Status Survey:    Vitals:   04/18/19 0912 04/18/19 0939  BP: (!) 132/96 (!) 143/90  Pulse: 75   Resp: 18   Temp: (!) 97.5 F (36.4 C)   TempSrc: Oral   SpO2: 100%   Weight: 177 lb 4 oz (80.4 kg)   Height: 5\' 3"  (1.6 m)    Body mass index is 31.4 kg/m. Physical Exam   General this is a pleasant elderly female in no distress she is bright and alert.  Her skin is warm and dry she does have some old bruising on her arms bilaterally I did not see anything that appears new.  Eyes visual acuity appears to be intact sclera conjunctive are clear.  Oropharynx is clear mucous membranes moist she has numerous extractions.  Chest is largely  clear to auscultation just a very minimal slight expiratory wheeze on the right there is no labored breathing.  Heart he has irregular irregular rate and rhythm without murmur gallop or rub she does not have significant edema.  Abdomen is soft nontender with positive bowel sounds.  Musculoskeletal Limited exam since she is in bed but appears able to move her extremities x4 at baseline.  Neurologic cannot really appreciate lateralizing findings her speech is clear cranial nerves appear grossly intact.  Psych she is pleasant and appropriate and alert-in fact when I entered the room she remembered me and remembered that I have worked with Dr. Linna Darner at times and Dr. Linna Darner  was her primary care provider before he retired from his  previous practice  Labs reviewed: Recent Labs    01/22/19 0405 01/24/19 0432  04/08/19 0021  04/11/19 0556 04/12/19 0536 04/14/19 0532  NA 142 144   < > 144   < > 148* 147* 145  K 3.2* 4.7   < > 3.5   < > 4.3 4.1 3.8  CL 105 112*   < > 107   < > 111 108 109  CO2 27 24   < > 27   < > 27 25 24   GLUCOSE 92 107*   < > 114*   < > 87 93 103*  BUN 24* 25*   < > 41*   < > 31* 29* 26*  CREATININE 1.41* 1.33*   < > 1.96*   < > 1.28* 1.26* 1.13*  CALCIUM 8.5* 9.4   < > 9.2   < > 9.0 9.3 9.4  MG 2.1 2.0  --  2.2  --   --   --   --   PHOS 3.4  --   --  4.0  --   --   --   --    < > = values in this interval not displayed.   Recent Labs    01/20/19 0918 03/07/19 1454 04/07/19 1530  AST 25 17 31   ALT 15 10 15   ALKPHOS 41 43 56  BILITOT 0.7 0.8 1.4*  PROT 5.9* 6.6 6.9  ALBUMIN 3.8 4.1 4.7   Recent Labs    01/20/19 0918  03/07/19 1454 04/07/19 1530  04/08/19 0021 04/12/19 0536  WBC 4.6   < > 6.3 8.2 6.9 5.3  NEUTROABS 3.1  --  4.6 6.5  --   --   HGB 12.0   < > 13.3 13.3 12.7 12.9  HCT 38.8   < > 40.5 41.2 40.0 42.0  MCV 97.5   < > 93.6 95.2 97.6 100.0  PLT 76*   < > 89.0* 75* 76* 70*   < > = values in this interval not displayed.   Lab Results   Component Value Date   TSH 3.58 08/22/2018   Lab Results  Component Value Date   HGBA1C 5.6 11/13/2013   Lab Results  Component Value Date   CHOL 97 08/16/2017   HDL 37.70 (L) 08/16/2017   LDLCALC 48 08/16/2017   TRIG 56.0 08/16/2017   CHOLHDL 3 08/16/2017    Significant Diagnostic Results in last 30 days:  Dg Lumbar Spine Complete  Result Date: 04/07/2019 CLINICAL DATA:  Pain EXAM: LUMBAR SPINE - COMPLETE 4+ VIEW COMPARISON:  None. FINDINGS: Leftward scoliosis in the lumbar spine. Diffuse osteopenia. Diffuse degenerative disc and facet disease. No fracture. Aortic calcifications. No aneurysm. IVC filter remains in place, unchanged. IMPRESSION: Leftward scoliosis. Advanced degenerative disc and facet disease diffusely. No acute bony abnormality. Osteopenia. Electronically Signed   By: Rolm Baptise M.D.   On: 04/07/2019 13:36   Dg Chest Port 1 View  Result Date: 04/17/2019 CLINICAL DATA:  Shortness of breath. EXAM: PORTABLE CHEST 1 VIEW COMPARISON:  April 12, 2019 FINDINGS: Mild developing opacity in the right base. The cardiomediastinal silhouette with cardiomegaly is identified and stable. No pneumothorax. No other acute abnormalities. IMPRESSION: Developing opacity in the right base could represent pneumonia. Recommend clinical correlation and short-term follow-up imaging to ensure resolution. Electronically Signed   By: Dorise Bullion III M.D   On: 04/17/2019 13:35   Dg Chest Port 1 View  Result Date: 04/12/2019 CLINICAL DATA:  Atrial fibrillation EXAM: PORTABLE CHEST 1 VIEW COMPARISON:  01/20/2019 FINDINGS: Cardiac shadow is enlarged but stable. Aortic calcifications are again seen. The lungs are well aerated bilaterally. No focal infiltrate or sizable effusion is seen. No bony abnormality is noted. IMPRESSION: No acute abnormality noted. Electronically Signed   By: Inez Catalina M.D.   On: 04/12/2019 20:14   Ct Renal Stone Study  Result Date: 04/07/2019 CLINICAL DATA:   Bilateral hip, back, flank pain for several days. EXAM: CT ABDOMEN AND PELVIS WITHOUT CONTRAST TECHNIQUE: Multidetector CT imaging of the abdomen and pelvis was performed following the standard protocol without IV contrast. COMPARISON:  08/06/2014 CT abdomen/pelvis. FINDINGS: Lower chest: Scattered small parenchymal bands in the dependent lung bases bilaterally, compatible with mild scarring or atelectasis. Cardiomegaly. Coronary atherosclerosis. Hepatobiliary: Normal liver size. No liver mass. Normal gallbladder with no radiopaque cholelithiasis. No biliary ductal dilatation. Pancreas: Normal, with no mass or duct dilation. Spleen: Normal size. No mass. Adrenals/Urinary Tract: Normal adrenals. Punctate nonobstructing lower left renal stone. No additional renal stones. No hydronephrosis. Slightly hyperdense 1.8 cm renal cortical lesion in the anterior interpolar left kidney with density 53 HU (series 3/image 25), stable since 08/06/2014 CT. No additional contour deforming renal lesions. Bladder obscured by streak artifact from left hip hardware and nondistended. No gross bladder abnormality. Stomach/Bowel: Normal non-distended stomach. Apparent right hemicolectomy with ileocolic anastomosis in the midline abdomen. No small bowel dilatation or wall thickening. Moderate stool throughout the remnant large-bowel. No large bowel wall thickening, significant diverticulosis or acute pericolonic fat stranding. Vascular/Lymphatic: Atherosclerotic nonaneurysmal  abdominal aorta. Infrarenal IVC filter is stable in position. No pathologically enlarged lymph nodes in the abdomen or pelvis. Reproductive: Status post hysterectomy, with no abnormal findings at the vaginal cuff. No adnexal mass. Other: No pneumoperitoneum, ascites or focal fluid collection. Musculoskeletal: No aggressive appearing focal osseous lesions. Marked thoracolumbar spondylosis. Left total hip arthroplasty. IMPRESSION: 1. No acute abnormality. Apparent right  hemicolectomy. No evidence of bowel obstruction or acute bowel inflammation. Moderate stool throughout the remnant large-bowel, suggesting constipation. 2. Punctate nonobstructing lower left renal stone. No hydronephrosis. No ureteral or bladder stones. 3. Slightly hyperdense 1.8 cm renal cortical lesion in the anterior interpolar left kidney. While technically indeterminate, lesion is stable since 2016 CT, probably a benign hemorrhagic/proteinaceous renal cyst. 4. Aortic Atherosclerosis (ICD10-I70.0). Electronically Signed   By: Ilona Sorrel M.D.   On: 04/07/2019 19:50   Dg Hips Bilat W Or Wo Pelvis 2 Views  Result Date: 04/07/2019 CLINICAL DATA:  Bilateral hip pain without known injury. EXAM: DG HIP (WITH OR WITHOUT PELVIS) 2V BILAT COMPARISON:  Radiographs of January 20, 2019. FINDINGS: Status post left total hip arthroplasty. No acute fracture or dislocation is noted. Right hip is unremarkable. IMPRESSION: No acute abnormality seen involving either hip. Status post left total hip arthroplasty. Electronically Signed   By: Marijo Conception M.D.   On: 04/07/2019 13:40    Assessment/Plan #1 History of UTI she has completed course of Rocephin for Klebsiella she does not really complain of dysuria confusion apparently has resolved and she is at her baseline she was bright and alert today she does have some history of cognitive impairment.  2.  History of cognitive impairment she is on Seroquel which she has been on at baseline at this point she appears to have returned cognitively to her baseline.  3.  History of atrial fibrillation this appears rate controlled on Lopressor 37.5 mg twice daily she is also on verapamil extended release 120 mg a day-she is also on Coumadin-INR was therapeutic in the hospital this will need to be updated later this week.  4.-History of pulmonary embolus as noted above she continues on Coumadin.  5.  History of hypertension she continues on Lopressor and verapamil as noted  above at this point appears to be stable blood pressure this morning was 140/72 we will monitor.  6.  History of CVA she continues on Coumadin as well as 81 mg of aspirin and a statin.  7.  History of chronic kidney disease stage III Lasix was held in the hospital but has been restarted-creatinine actually showed improvement in the hospital at 1.13-we will have this updated later this week as well since she is now back on her Lasix.  8.  History of CHF with ejection fraction of 60 to 65% on echo done in July 2020 Lasix was held in the hospital and since been restarted-she is also on Imdur 30 mg a day-at this point continue to monitor I do not see significant evidence of fluid overload or edema on exam but this will have to be watched-.  9.  History of chronic back pain-x-rays in hospital showed degenerative disc disease at this point will monitor she does have orders for as needed tramadol and Tylenol for pain but this will have to be watched.  10.  History of asthma she continues on [facility] as well as as needed albuterol she is also on as needed ProAir.  .  #11-history of thrombocytopenia apparently there is some chronicity to this platelet level  was 70,000 on labs done on October 7 we will monitor this periodically I do not see any evidence of increased bruising or bleeding.  [id number]  greater than 35 minutes spent on this discharge summary-greater than 50% of time spent coordinating a plan of care for numerous diagnoses

## 2019-04-19 MED ORDER — ALPRAZOLAM 0.25 MG PO TABS: 0.2500 mg | ORAL_TABLET | Freq: Two times a day (BID) | ORAL | 0 refills | Status: DC | PRN

## 2019-04-21 ENCOUNTER — Encounter: Payer: Self-pay | Admitting: Internal Medicine

## 2019-04-21 NOTE — Progress Notes (Signed)
: Provider:  [name]., MD Location:  [address] Room Number: 511-P Place of Service:  SNF ([phone])  PCP: Colon Branch, MD Patient Care Team: Colon Branch, MD as PCP - General (Internal Medicine)  Extended Emergency Contact Information Primary Emergency Contact: [name],[name] Address: [address]          [place], [place] [address] [name] of [place] Phone: [phone] Mobile Phone: [phone] Relation: Daughter     Allergies: Patient has no known allergies.  Chief Complaint  Patient presents with   New Admit To SNF    New admission to [facility] SNF    HPI: Patient is a 85 y.o. female with history of atrial fibrillation/pulmonary embolism on Coumadin, hypertension, hyperlipidemia, CVA, CKD stage III, asthma, and colon cancer who presented to [facility] with confusion and lower back pain causing difficulty walking.  Patient was admitted to [facility] from 10/2-12 where she was treated for Klebsiella UTI treated with Rocephin for 5 days of IV antibiotic.  X-ray of the lumbar spine showed degenerative disc disease.  Patient had acute kidney injury which responded to IV fluids.  Patient is admitted to skilled nursing facility for OT/PT.  While at SNF patient will be followed for atrial fibrillation treated with metoprolol verapamil and Coumadin, hyperlipidemia treated with Lipitor and coronary artery disease treated with aspirin, Imdur, metoprolol,.  Past Medical History:  Diagnosis Date   Acute encephalopathy    Acute renal failure superimposed on stage 3 chronic kidney disease (HCC)    Anemia    Atrial fibrillation ([place])    Colon cancer ([place]) 12/11   Colon cancer ([place]) 2011   Diarrhea    DVT (deep venous thrombosis) ([place]) 11/06   Elevated brain natriuretic peptide (BNP) level    Frequent headaches    Heart murmur    Hyperkalemia    Hypertension    Pulmonary embolism ([place]) 11/06   Seizures  ([place])    Stroke ([place]) 11/30/2014   Urine retention 01/2019    Past Surgical History:  Procedure Laterality Date   APPENDECTOMY  1952   COLECTOMY  06/2010   partial, Dr. Donne Hazel complicated by LLE CVT; S/P IVC umbrella & anemia   HIP FRACTURE SURGERY  2006   Trauma   TOTAL ABDOMINAL HYSTERECTOMY W/ BILATERAL SALPINGOOPHORECTOMY  1973   For Fibroids   TOTAL HIP ARTHROPLASTY  01/03/07   Left hip replacement.   VENA CAVA FILTER PLACEMENT  06/28/2010    Allergies as of 04/17/2019   No Known Allergies     Medication List    Notice   This visit is on the same day as an admission, and a visit start time could not be determined. If the visit took place after discharge, manually review the med list with the patient.    Current Outpatient Medications on File Prior to Visit  Medication Sig Dispense Refill   acetaminophen (TYLENOL) 500 MG tablet Take 1,000 mg by mouth every 8 (eight) hours as needed for headache (pain).      albuterol (PROVENTIL) (2.5 MG/3ML) 0.083% nebulizer solution Take 2.5 mg by nebulization every 6 (six) hours as needed for wheezing or shortness of breath.     aspirin EC 81 MG tablet Take 81 mg by mouth daily.     atorvastatin (LIPITOR) 20 MG tablet Take 1 tablet (20 mg total) by mouth daily. 90 tablet 1   calcium-vitamin D (OSCAL WITH D) 500-200 MG-UNIT tablet Take  1 tablet by mouth daily with breakfast. 30 tablet 0   furosemide (LASIX) 80 MG tablet Take 1 tablet (80 mg total) by mouth daily. 30 tablet 4   isosorbide mononitrate (IMDUR) 30 MG 24 hr tablet Take 0.5 tablets (15 mg total) by mouth daily. 45 tablet 3   loratadine (CLARITIN) 10 MG tablet Take 1 tablet (10 mg total) by mouth daily as needed (seasonal allergies). 30 tablet 0   metoprolol tartrate (LOPRESSOR) 25 MG tablet Take 1.5 tablets (37.5 mg total) by mouth 2 (two) times daily. 270 tablet 1   mometasone-formoterol (DULERA) 100-5 MCG/ACT AERO Inhale 2 puffs into the lungs 2 (two) times  daily. 13 g 6   Multiple Vitamin (MULTIVITAMIN WITH MINERALS) TABS tablet Take 1 tablet by mouth daily. 30 tablet 0   polyethylene glycol (MIRALAX / GLYCOLAX) packet Take 17 g by mouth daily as needed. 14 each 0   PROAIR HFA 108 (90 Base) MCG/ACT inhaler Inhale 2 puffs into the lungs every 6 (six) hours as needed for wheezing or shortness of breath. 18 g 5   promethazine (PHENERGAN) 12.5 MG tablet Take 1 tablet (12.5 mg total) by mouth 3 (three) times daily as needed for nausea or vomiting. 30 tablet 0   QUEtiapine (SEROQUEL) 25 MG tablet Take 2 tablets (50 mg total) by mouth at bedtime. 180 tablet 1   tamsulosin (FLOMAX) 0.4 MG CAPS capsule Take 1 capsule (0.4 mg total) by mouth daily. 90 capsule 1   traMADol (ULTRAM) 50 MG tablet Take 1 tablet (50 mg total) by mouth every 6 (six) hours as needed for up to 7 days for moderate pain. 28 tablet 0   verapamil (VERELAN PM) 120 MG 24 hr capsule Take 120 mg by mouth at bedtime.     warfarin (COUMADIN) 2.5 MG tablet Take 2.5 mg by mouth daily.      No current facility-administered medications on file prior to visit.      No orders of the defined types were placed in this encounter.   Immunization History  Administered Date(s) Administered   Fluad Quad(high Dose 65+) 03/07/2019   Influenza Split 03/30/2012   Influenza Whole 04/30/2006, 04/20/2008, 04/15/2009   Influenza, High Dose Seasonal PF 04/20/2013, 03/27/2015, 03/31/2016, 04/22/2017, 04/20/2018   Influenza,inj,Quad PF,6+ Mos 05/23/2014   Pneumococcal Conjugate-13 04/24/2015   Pneumococcal Polysaccharide-23 07/06/2009   Td 10/30/2016    Social History   Tobacco Use   Smoking status: Passive Smoke Exposure - Never Smoker   Smokeless tobacco: Never Used   Tobacco comment: husband smoked in home.   Substance Use Topics   Alcohol use: No    Alcohol/week: 0.0 standard drinks    Family history is   Family History  Problem Relation Age of Onset   Peripheral  vascular disease Father    Heart attack Mother 8   Heart disease Mother        MI   Coronary artery disease Sister    Diabetes Paternal Grandmother    Coronary artery disease Maternal Grandfather       Review of Systems  DATA OBTAINED: from patient, nurse GENERAL:  no fevers, fatigue, appetite changes SKIN: No itching, or rash EYES: No eye pain, redness, discharge EARS: No earache, tinnitus, change in hearing NOSE: No congestion, drainage or bleeding  MOUTH/THROAT: No mouth or tooth pain, No sore throat RESPIRATORY: No cough, wheezing, SOB CARDIAC: No chest pain, palpitations, lower extremity edema; right leg diameter greater than left leg diameter at baseline GI: No  abdominal pain, No N/V/D or constipation, No heartburn or reflux  GU: No dysuria, frequency or urgency, or incontinence  MUSCULOSKELETAL: No unrelieved bone/joint pain NEUROLOGIC: No headache, dizziness or focal weakness PSYCHIATRIC: No c/o anxiety or sadness   Vitals:   04/21/19 1540  BP: 122/70  Pulse: 99  Resp: 18  Temp: 98 F (36.7 C)  SpO2: 100%    SpO2 Readings from Last 1 Encounters:  04/18/19 100%   Body mass index is 30.11 kg/m.     Physical Exam  GENERAL APPEARANCE: Alert, conversant,  No acute distress.  SKIN: No diaphoresis rash HEAD: Normocephalic, atraumatic  EYES: Conjunctiva/lids clear. Pupils round, reactive. EOMs intact.  EARS: External exam WNL, canals clear. Hearing grossly normal.  NOSE: No deformity or discharge.  MOUTH/THROAT: Lips w/o lesions  RESPIRATORY: Breathing is even, unlabored. Lung sounds are clear   CARDIOVASCULAR: Heart irregular no murmurs, rubs or gallops. No peripheral edema.,  Right leg greater than left leg slightly at baseline GASTROINTESTINAL: Abdomen is soft, non-tender, not distended w/ normal bowel sounds. GENITOURINARY: Bladder non tender, not distended  MUSCULOSKELETAL: No abnormal joints or musculature NEUROLOGIC:  Cranial nerves 2-12 grossly  intact. Moves all extremities  PSYCHIATRIC: Mood and affect appropriate to situation, no behavioral issues  Patient Active Problem List   Diagnosis Date Noted   Back pain 04/08/2019   MCI (mild cognitive impairment) 03/07/2019   Congestive heart failure (CHF) ([place]) 01/20/2019   CAD (coronary artery disease) 06/07/2018   Pulmonary hypertension, primary ([place]) 06/07/2018   Generalized weakness    Abnormal LFTs 06/01/2018   Hallucination 06/01/2018   Abnormal chest x-ray 09/28/2015   Edema of right lower extremity 09/28/2015   Elevated brain natriuretic peptide (BNP) level 09/17/2015   Seizures (HCC) 09/16/2015   Asthma 08/14/2015   PCP NOTES >>>>>>>>>>>>>>>>>>>>>>>>>>>>>> 04/24/2015   TIA (transient ischemic attack) 12/11/2014   Dyslipidemia 12/11/2014   History of CVA (cerebrovascular accident) 12/01/2014   Thrombocytopenia ([place]) 12/01/2014   Chronic renal insufficiency, stage II (mild) 11/12/2013   Impingement syndrome, shoulder, left 11/09/2013   Acquired short bowel syndrome 11/09/2013   Annual physical exam 08/30/2013   Hx pulmonary embolism 06/27/2013   Warfarin anticoagulation 06/27/2013   DDD (degenerative disc disease), lumbar 11/18/2012   COLON CANCER, HX OF 07/08/2010   DIZZINESS 10/18/2009   Essential hypertension 12/16/2007   HIP PAIN, CHRONIC 12/28/2006   ANEMIA-NOS 07/15/2006   Personal history of venous thrombosis and embolism 07/15/2006      Labs reviewed: Basic Metabolic Panel:    Component Value Date/Time   NA 145 04/14/2019 0532   NA 146 06/27/2018   NA 142 06/09/2012 1509   K 3.8 04/14/2019 0532   K 3.9 06/09/2012 1509   CL 109 04/14/2019 0532   CL 108 (H) 06/09/2012 1509   CO2 24 04/14/2019 0532   CO2 26 06/09/2012 1509   GLUCOSE 103 (H) 04/14/2019 0532   GLUCOSE 103 (H) 06/09/2012 1509   BUN 26 (H) 04/14/2019 0532   BUN 47 (A) 06/27/2018   BUN 17.0 06/09/2012 1509   CREATININE 1.13 (H) 04/14/2019 0532    CREATININE 1.51 (H) 07/22/2018 1624   CREATININE 1.2 (H) 06/09/2012 1509   CALCIUM 9.4 04/14/2019 0532   CALCIUM 9.5 06/09/2012 1509   PROT 6.9 04/07/2019 1530   PROT 6.9 01/13/2018 1620   PROT 7.2 06/09/2012 1509   ALBUMIN 4.7 04/07/2019 1530   ALBUMIN 4.6 01/13/2018 1620   ALBUMIN 4.2 06/09/2012 1509   AST 31 04/07/2019 1530  AST 17 06/09/2012 1509   ALT 15 04/07/2019 1530   ALT 11 06/09/2012 1509   ALKPHOS 56 04/07/2019 1530   ALKPHOS 47 06/09/2012 1509   BILITOT 1.4 (H) 04/07/2019 1530   BILITOT 1.1 01/13/2018 1620   BILITOT 0.73 06/09/2012 1509   GFRNONAA 43 (L) 04/14/2019 0532   GFRAA 50 (L) 04/14/2019 0532    Recent Labs    01/22/19 0405 01/24/19 0432  04/08/19 0021  04/11/19 0556 04/12/19 0536 04/14/19 0532  NA 142 144   < > 144   < > 148* 147* 145  K 3.2* 4.7   < > 3.5   < > 4.3 4.1 3.8  CL 105 112*   < > 107   < > 111 108 109  CO2 27 24   < > 27   < > 27 25 24   GLUCOSE 92 107*   < > 114*   < > 87 93 103*  BUN 24* 25*   < > 41*   < > 31* 29* 26*  CREATININE 1.41* 1.33*   < > 1.96*   < > 1.28* 1.26* 1.13*  CALCIUM 8.5* 9.4   < > 9.2   < > 9.0 9.3 9.4  MG 2.1 2.0  --  2.2  --   --   --   --   PHOS 3.4  --   --  4.0  --   --   --   --    < > = values in this interval not displayed.   Liver Function Tests: Recent Labs    01/20/19 0918 03/07/19 1454 04/07/19 1530  AST 25 17 31   ALT 15 10 15   ALKPHOS 41 43 56  BILITOT 0.7 0.8 1.4*  PROT 5.9* 6.6 6.9  ALBUMIN 3.8 4.1 4.7   No results for input(s): LIPASE, AMYLASE in the last 8760 hours. No results for input(s): AMMONIA in the last 8760 hours. CBC: Recent Labs    01/20/19 0918  03/07/19 1454 04/07/19 1530 04/08/19 0021 04/12/19 0536  WBC 4.6   < > 6.3 8.2 6.9 5.3  NEUTROABS 3.1  --  4.6 6.5  --   --   HGB 12.0   < > 13.3 13.3 12.7 12.9  HCT 38.8   < > 40.5 41.2 40.0 42.0  MCV 97.5   < > 93.6 95.2 97.6 100.0  PLT 76*   < > 89.0* 75* 76* 70*   < > = values in this interval not displayed.    Lipid No results for input(s): CHOL, HDL, LDLCALC, TRIG in the last 8760 hours.  Cardiac Enzymes: Recent Labs    06/02/18 0223 06/04/18 0731 06/04/18 1428 06/04/18 1818 06/05/18 0030  CKTOTAL 557* 154  --   --   --   TROPONINI  --   --  <0.03 <0.03 <0.03   BNP: Recent Labs    01/20/19 0941 04/14/19 0532  BNP 852.0* 1,139.3*   No results found for: [facility] Lab Results  Component Value Date   HGBA1C 5.6 11/13/2013   Lab Results  Component Value Date   TSH 3.58 08/22/2018   Lab Results  Component Value Date   VITAMINB12 880 08/22/2018   Lab Results  Component Value Date   FOLATE >24.0 08/22/2018   Lab Results  Component Value Date   IRON 70 10/28/2010   TIBC 413 06/13/2010   FERRITIN 7 (L) 06/13/2010    Imaging and Procedures obtained prior to SNF admission: Dg Lumbar Spine  Complete  Result Date: 04/07/2019 CLINICAL DATA:  Pain EXAM: LUMBAR SPINE - COMPLETE 4+ VIEW COMPARISON:  None. FINDINGS: Leftward scoliosis in the lumbar spine. Diffuse osteopenia. Diffuse degenerative disc and facet disease. No fracture. Aortic calcifications. No aneurysm. IVC filter remains in place, unchanged. IMPRESSION: Leftward scoliosis. Advanced degenerative disc and facet disease diffusely. No acute bony abnormality. Osteopenia. Electronically Signed   By: Rolm Baptise M.D.   On: 04/07/2019 13:36   Ct Renal Stone Study  Result Date: 04/07/2019 CLINICAL DATA:  Bilateral hip, back, flank pain for several days. EXAM: CT ABDOMEN AND PELVIS WITHOUT CONTRAST TECHNIQUE: Multidetector CT imaging of the abdomen and pelvis was performed following the standard protocol without IV contrast. COMPARISON:  08/06/2014 CT abdomen/pelvis. FINDINGS: Lower chest: Scattered small parenchymal bands in the dependent lung bases bilaterally, compatible with mild scarring or atelectasis. Cardiomegaly. Coronary atherosclerosis. Hepatobiliary: Normal liver size. No liver mass. Normal gallbladder with no  radiopaque cholelithiasis. No biliary ductal dilatation. Pancreas: Normal, with no mass or duct dilation. Spleen: Normal size. No mass. Adrenals/Urinary Tract: Normal adrenals. Punctate nonobstructing lower left renal stone. No additional renal stones. No hydronephrosis. Slightly hyperdense 1.8 cm renal cortical lesion in the anterior interpolar left kidney with density 53 HU (series 3/image 25), stable since 08/06/2014 CT. No additional contour deforming renal lesions. Bladder obscured by streak artifact from left hip hardware and nondistended. No gross bladder abnormality. Stomach/Bowel: Normal non-distended stomach. Apparent right hemicolectomy with ileocolic anastomosis in the midline abdomen. No small bowel dilatation or wall thickening. Moderate stool throughout the remnant large-bowel. No large bowel wall thickening, significant diverticulosis or acute pericolonic fat stranding. Vascular/Lymphatic: Atherosclerotic nonaneurysmal abdominal aorta. Infrarenal IVC filter is stable in position. No pathologically enlarged lymph nodes in the abdomen or pelvis. Reproductive: Status post hysterectomy, with no abnormal findings at the vaginal cuff. No adnexal mass. Other: No pneumoperitoneum, ascites or focal fluid collection. Musculoskeletal: No aggressive appearing focal osseous lesions. Marked thoracolumbar spondylosis. Left total hip arthroplasty. IMPRESSION: 1. No acute abnormality. Apparent right hemicolectomy. No evidence of bowel obstruction or acute bowel inflammation. Moderate stool throughout the remnant large-bowel, suggesting constipation. 2. Punctate nonobstructing lower left renal stone. No hydronephrosis. No ureteral or bladder stones. 3. Slightly hyperdense 1.8 cm renal cortical lesion in the anterior interpolar left kidney. While technically indeterminate, lesion is stable since 2016 CT, probably a benign hemorrhagic/proteinaceous renal cyst. 4. Aortic Atherosclerosis (ICD10-I70.0). Electronically  Signed   By: Ilona Sorrel M.D.   On: 04/07/2019 19:50   Dg Hips Bilat W Or Wo Pelvis 2 Views  Result Date: 04/07/2019 CLINICAL DATA:  Bilateral hip pain without known injury. EXAM: DG HIP (WITH OR WITHOUT PELVIS) 2V BILAT COMPARISON:  Radiographs of January 20, 2019. FINDINGS: Status post left total hip arthroplasty. No acute fracture or dislocation is noted. Right hip is unremarkable. IMPRESSION: No acute abnormality seen involving either hip. Status post left total hip arthroplasty. Electronically Signed   By: Marijo Conception M.D.   On: 04/07/2019 13:40     Not all labs, radiology exams or other studies done during hospitalization come through on my EPIC note; however they are reviewed by me.    Assessment and Plan  Klebsiella UTI/metabolic encephalopathy/AKI on CKD 3-patient treated with 5 days of IV Rocephin; AKI improved with IV fluids down to baseline of 1.4 SNF-patient admitted for OT/PT follow-up BMP  Chronic low back pain-x-ray with DJD SNF-pain control with Tylenol tramadol  Permanent atrial fibrillation SNF-continue metoprolol 37.5 mg twice daily, verapamil 120 mg  daily and warfarin 2.5 mg daily; INR will be monitored  CAD SNF-no complaints of chest pain continue ASA 81 mg daily, Imdur 15 mg daily metoprolol 37.5 mg twice daily; patient is on warfarin, patient is on statin  Hyperlipidemia SNF-not stated as uncontrolled; continue Lipitor 20 mg daily  Hyperlipidemia chronic diastolic congestive heart failure-EF 60 to 65% SNF-Lasix restarted at 20 mg daily continue metoprolol 37.5 mg twice daily    Time spent greater than 45 minutes;> 50% of time with patient was spent reviewing records, labs, tests and studies, counseling and developing plan of care  [name], MD

## 2019-04-22 ENCOUNTER — Encounter: Payer: Self-pay | Admitting: Internal Medicine

## 2019-04-22 DIAGNOSIS — G9341 Metabolic encephalopathy: Secondary | ICD-10-CM | POA: Insufficient documentation

## 2019-04-22 DIAGNOSIS — N3090 Cystitis, unspecified without hematuria: Secondary | ICD-10-CM | POA: Insufficient documentation

## 2019-04-22 DIAGNOSIS — B9689 Other specified bacterial agents as the cause of diseases classified elsewhere: Secondary | ICD-10-CM | POA: Insufficient documentation

## 2019-04-25 ENCOUNTER — Non-Acute Institutional Stay (SKILLED_NURSING_FACILITY): Payer: Medicare Other | Admitting: Internal Medicine

## 2019-04-25 DIAGNOSIS — R7989 Other specified abnormal findings of blood chemistry: Secondary | ICD-10-CM | POA: Diagnosis not present

## 2019-04-25 DIAGNOSIS — E870 Hyperosmolality and hypernatremia: Secondary | ICD-10-CM

## 2019-04-26 ENCOUNTER — Non-Acute Institutional Stay (SKILLED_NURSING_FACILITY): Payer: Medicare Other | Admitting: Internal Medicine

## 2019-04-26 DIAGNOSIS — I5032 Chronic diastolic (congestive) heart failure: Secondary | ICD-10-CM

## 2019-04-26 DIAGNOSIS — R791 Abnormal coagulation profile: Secondary | ICD-10-CM

## 2019-04-26 DIAGNOSIS — D696 Thrombocytopenia, unspecified: Secondary | ICD-10-CM | POA: Diagnosis not present

## 2019-04-26 DIAGNOSIS — E870 Hyperosmolality and hypernatremia: Secondary | ICD-10-CM | POA: Diagnosis not present

## 2019-04-26 DIAGNOSIS — I482 Chronic atrial fibrillation, unspecified: Secondary | ICD-10-CM | POA: Diagnosis not present

## 2019-04-26 NOTE — Progress Notes (Signed)
This is an acute visit.  Level care skilled.  [facility]  Chief complaint acute visit secondary to elevated INR also follow-up hypernatremia.--CHF  History of present illness.  Patient is a pleasant 85 year old female who is here for rehab and strengthening after hospitalization for confusion and lower back pain with difficulty walking.  She was treated for a UTI Klebsiella-she did receive Rocephin.  She also had acute kidney injury which responded to IV fluids.  She does have a history of atrial fibrillation she is on Lopressor verapamil and Coumadin.  .  She also has a history of previous DVT and pulmonary embolism  She also has a history of coronary artery disease which was treated with aspirin Imdur as well as Lopressor.  She is currently on Coumadin 2.5 mg a day and it was on this in the hospital as well and was actually quite stable with INRs consistently in the lower 2 range however her INR done today shows it has risen up to 5.2  She has not really had any increased bruising-she has had some bleeding from the IV site which was just started for IV fluids but otherwise no active bleeding.  She appears to be in good spirits vital signs are stable she does not really have any complaints this evening.  Lab done earlier this week did show her sodium had risen up to 147 and she has orders for half-normal saline for 4 L which apparently have just been started.  Hdoes have a history of CHF and is on Lasix 80 mg a day and this was held temporarily in the hospital because of her renal issues but has been restarted--- she does not really show evidence of edema congestion this appears to be stable asymptomatic  Past Medical History:  Diagnosis Date  . Acute encephalopathy   . Acute renal failure superimposed on stage 3 chronic kidney disease ([place])   . Anemia   . Atrial fibrillation ([place])   . Colon cancer ([place]) 12/11  . Colon cancer ([place]) 2011  . Diarrhea   .  DVT (deep venous thrombosis) ([place]) 11/06  . Elevated brain natriuretic peptide (BNP) level   . Frequent headaches   . Heart murmur   . Hyperkalemia   . Hypertension   . Pulmonary embolism ([place]) 11/06  . Seizures ([place])   . Stroke ([place]) 11/30/2014  . Urine retention 01/2019        Past Surgical History:  Procedure Laterality Date  . APPENDECTOMY  1952  . COLECTOMY  06/2010   partial, Dr. Donne Hazel complicated by LLE CVT; S/P IVC umbrella & anemia  . HIP FRACTURE SURGERY  2006   Trauma  . TOTAL ABDOMINAL HYSTERECTOMY W/ BILATERAL SALPINGOOPHORECTOMY  1973   For Fibroids  . TOTAL HIP ARTHROPLASTY  01/03/07   Left hip replacement.  Marland Kitchen VENA CAVA FILTER PLACEMENT  06/28/2010    No Known Allergies      MEDICATIONS   Medication Sig  . acetaminophen (TYLENOL) 500 MG tablet Take 1,000 mg by mouth every 8 (eight) hours as needed for headache (pain).   Marland Kitchen albuterol (PROVENTIL) (2.5 MG/3ML) 0.083% nebulizer solution Take 2.5 mg by nebulization every 6 (six) hours as needed for wheezing or shortness of breath.  Marland Kitchen aspirin EC 81 MG tablet Take 81 mg by mouth daily.  Marland Kitchen atorvastatin (LIPITOR) 20 MG tablet Take 1 tablet (20 mg total) by mouth daily.  . calcium-vitamin D (OSCAL WITH D) 500-200 MG-UNIT tablet Take 1 tablet  by mouth daily with breakfast.  . furosemide (LASIX) 80 MG tablet Take 1 tablet (80 mg total) by mouth daily.  . isosorbide mononitrate (IMDUR) 30 MG 24 hr tablet Take 0.5 tablets (15 mg total) by mouth daily.  Marland Kitchen loratadine (CLARITIN) 10 MG tablet Take 1 tablet (10 mg total) by mouth daily as needed (seasonal allergies).  . metoprolol tartrate (LOPRESSOR) 25 MG tablet Take 1.5 tablets (37.5 mg total) by mouth 2 (two) times daily.  . mometasone-formoterol (DULERA) 100-5 MCG/ACT AERO Inhale 2 puffs into the lungs 2 (two) times daily.  . Multiple Vitamin (MULTIVITAMIN WITH MINERALS) TABS tablet Take 1 tablet by mouth daily.  . polyethylene glycol (MIRALAX /  GLYCOLAX) packet Take 17 g by mouth daily as needed.  Marland Kitchen PROAIR HFA 108 (90 Base) MCG/ACT inhaler Inhale 2 puffs into the lungs every 6 (six) hours as needed for wheezing or shortness of breath.  . promethazine (PHENERGAN) 12.5 MG tablet Take 1 tablet (12.5 mg total) by mouth 3 (three) times daily as needed for nausea or vomiting.  Marland Kitchen QUEtiapine (SEROQUEL) 25 MG tablet Take 2 tablets (50 mg total) by mouth at bedtime.  . tamsulosin (FLOMAX) 0.4 MG CAPS capsule Take 1 capsule (0.4 mg total) by mouth daily.  . traMADol (ULTRAM) 50 MG tablet Take 1 tablet (50 mg total) by mouth every 6 (six) hours as needed for up to 7 days for moderate pain.  . verapamil (VERELAN PM) 120 MG 24 hr capsule Take 120 mg by mouth at bedtime.  Marland Kitchen warfarin (COUMADIN) 2.5 MG tablet Take 2.5 mg by mouth    Review of systems.  General she is not complaining of any fever or chills does not really have any complaints this evening.  Skin does not complain of any rashes-there has been some bleeding from her IV site but otherwise no bleeding or increased bruising from baseline  Head ears eyes nose mouth and throat is not complain of visual changes or sore throat.  Respiratory does not complain of being short of breath or having a cough.  Cardiac does not complain of chest pain or increased edema.  GI no complaints of abdominal pain nausea vomiting diarrhea constipation.  GU does not complain of dysuria.  Musculoskeletal   l is not complaining of pain currently does have some history of back pain.  Neurologic does not complain of feeling dizzy or numb syncopal or having a headache.  Psych does have a history of some cognitive impairment she is on Seroquel at night for behaviors but this appears to be well controlled she appears to be in good spirits today she is bright alert positive test does not complain of any depressed or anxious  Physical exam.  Temperature is 97.1 pulse was 76 respirations 19 blood pressure  129/77.  In general this is a pleasant elderly female no distress lying comfortably in bed.  Her skin is warm and dry she does have some small bruises on her arms bilaterally but these do not appear to be new-IV site just started on her left arm does have some bleeding.  Eyes visual acuity appears to be intact sclera and conjunctive are clear.  Oropharynx is clear mucous membranes moist.  Chest is clear to auscultation with somewhat shallow air entry there is no labored breathing.  Heart is irregular irregular rate and rhythm without murmur gallop or rub she has minimal lower extremity edema.  Abdomen is soft nontender with positive bowel sounds.  Musculoskeletal appears able to move  all extremities x4 at baseline again she does have the IV site in her left upper arm-limited exam since she is in bed but I do not note any changes from baseline.  Neurologic appears to be grossly intact her speech is clear cannot really appreciate lateralizing findings cranial nerves appear to be intact.  Psych-she is pleasant and appropriate which is her baseline appears to be in good spirits.  Labs.  19.  2020.  WBC 4.3 hemoglobin 13.3 platelets 85,000.  Sodium 147 potassium 3.5 BUN 32.7 creatinine 1.25.  Assessment and plan.  1.-Elevated INR-5.2-we will hold her Coumadin as well as aspirin for the time being-clinically she appears to be stable only bleeding is from the recently obtained IV site this will have to be monitored but otherwise appears to be doing well.  We will need an updated INR tomorrow-at this point will put her on bedrest.  2.  History of hypernatremia-IV of half-normal saline has been started update labs are pending for October 23-clinically she appears to be stable.  I do note she is on Lasix will hold this temporarily because of the elevated sodium.  Continue to monitor for any changes  3 history of CHF with ejection fraction of 6065% on echo in July 2020-her Lasix was  held in hospital because of renal issues this has since been restarted she is also on Imdur 30 mg a day.  At this point I do not see clinically putting on a gross amount of edema or clinical CHF-but this will have to be watched since we are holding her Lasix temporarily because of elevated sodium.  4.  History of atrial fibrillation this appears rate controlled on Lopressor 37.5 mg twice daily. Verapamil 120 mg a day.  Again we are holding her Coumadin and aspirin for the time being because of the elevated INR and risk of bleeding.  5.-History of pulmonary embolus and DVT-again she is on Coumadin INR supratherapeutic however so this is being held temporarily  #6 history of chronic kidney disease stage III-again recent panel showed an elevated sodium of 147 creatinine is 1.25-Lasix is currently being held for short-term-but I suspect this will be restarted in somewhat short order once labs show some mild improvement.  .  7.  Thrombocytopenia-platelet count was 85,000 on lab done on October 19 this actually appears to be relatively baseline with previous values.-She does not really show evidence of increased bruising-bleeding appears to be confined to just placed IV site  There is chronicity to the thrombocytopenia   [id number]--notre   greater than 35 minutes spent assessing patient discussing her status with nursing staff-reviewing her chart and labs-and coordinating and formulating a plan of care for numerous diagnoses-of note greater than 50% of time spent coordinating a plan of care with input as noted above

## 2019-04-27 ENCOUNTER — Non-Acute Institutional Stay (SKILLED_NURSING_FACILITY): Payer: Medicare Other | Admitting: Internal Medicine

## 2019-04-27 ENCOUNTER — Encounter: Payer: Self-pay | Admitting: Internal Medicine

## 2019-04-27 DIAGNOSIS — I482 Chronic atrial fibrillation, unspecified: Secondary | ICD-10-CM

## 2019-04-27 DIAGNOSIS — I5032 Chronic diastolic (congestive) heart failure: Secondary | ICD-10-CM | POA: Diagnosis not present

## 2019-04-27 DIAGNOSIS — E870 Hyperosmolality and hypernatremia: Secondary | ICD-10-CM | POA: Diagnosis not present

## 2019-04-27 DIAGNOSIS — R791 Abnormal coagulation profile: Secondary | ICD-10-CM | POA: Diagnosis not present

## 2019-04-27 NOTE — Progress Notes (Signed)
This is an acute visit.  Level care skilled.  [facility]ility [facility]-acute visit follow-up elevated INR-.  History of present illness.  Patient is a pleasant 85 year old female who was seen yesterday for a supratherapeutic INR of 5.2.  She is on Coumadin 2.5 mg a day with a history of atrial fibrillation as well as previous history of DVT and pulmonary embolism.  She did have some bleeding and a new IV site yesterday but otherwise no active bleeding or signs of increased bruising.  The bleeding at the IV site has largely resolved-there is a little residual bleeding it appears real significant bleeding.  She continues to be at baseline today.  She is receiving IV half-normal saline because of a sodium of 147 on lab done earlier this week.  Update labs are pending for follow-up tomorrow.  In regards to elevated sodium her Lasix currently is being held as well because of this.  She is on Lasix with a history of CHF with an ejection fraction of 60-65% on most recent echo.  She continues to be in good spirits does not really have any complaints today except occasionally she feels like she has some leg cramps she says this has been happening for the last week or 2 feels it may be relatively immobility related.  She does not complain of this being persistent or totally new.  Vital signs appear to be stable.  Updated INR today has come down to 4.  Past Medical History:  Diagnosis Date  . Acute encephalopathy   . Acute renal failure superimposed on stage 3 chronic kidney disease ([place])   . Anemia   . Atrial fibrillation ([place])   . Colon cancer ([place]) 12/11  . Colon cancer ([place]) 2011  . Diarrhea   . DVT (deep venous thrombosis) ([place]) 11/06  . Elevated brain natriuretic peptide (BNP) level   . Frequent headaches   . Heart murmur   . Hyperkalemia   . Hypertension   . Pulmonary embolism ([place]) 11/06  . Seizures ([place])   . Stroke ([place]) 11/30/2014   . Urine retention 01/2019        Past Surgical History:  Procedure Laterality Date  . APPENDECTOMY  1952  . COLECTOMY  06/2010   partial, Dr. Donne Hazel complicated by LLE CVT; S/P IVC umbrella & anemia  . HIP FRACTURE SURGERY  2006   Trauma  . TOTAL ABDOMINAL HYSTERECTOMY W/ BILATERAL SALPINGOOPHORECTOMY  1973   For Fibroids  . TOTAL HIP ARTHROPLASTY  01/03/07   Left hip replacement.  Marland Kitchen VENA CAVA FILTER PLACEMENT  06/28/2010    No Known Allergies      MEDICATIONS   Medication Sig  . acetaminophen (TYLENOL) 500 MG tablet Take 1,000 mg by mouth every 8 (eight) hours as needed for headache (pain).   Marland Kitchen albuterol (PROVENTIL) (2.5 MG/3ML) 0.083% nebulizer solution Take 2.5 mg by nebulization every 6 (six) hours as needed for wheezing or shortness of breath.  Marland Kitchen aspirin EC 81 MG tablet Take 81 mg by mouth daily.  Marland Kitchen atorvastatin (LIPITOR) 20 MG tablet Take 1 tablet (20 mg total) by mouth daily.  . calcium-vitamin D (OSCAL WITH D) 500-200 MG-UNIT tablet Take 1 tablet by mouth daily with breakfast.  . furosemide (LASIX) 80 MG tablet Take 1 tablet (80 mg total) by mouth daily.----Currently being held as of this AM   . isosorbide mononitrate (IMDUR) 30 MG 24 hr tablet Take 0.5 tablets (15 mg total) by mouth  daily.  . loratadine (CLARITIN) 10 MG tablet Take 1 tablet (10 mg total) by mouth daily as needed (seasonal allergies).  . metoprolol tartrate (LOPRESSOR) 25 MG tablet Take 1.5 tablets (37.5 mg total) by mouth 2 (two) times daily.  . mometasone-formoterol (DULERA) 100-5 MCG/ACT AERO Inhale 2 puffs into the lungs 2 (two) times daily.  . Multiple Vitamin (MULTIVITAMIN WITH MINERALS) TABS tablet Take 1 tablet by mouth daily.  . polyethylene glycol (MIRALAX / GLYCOLAX) packet Take 17 g by mouth daily as needed.  Marland Kitchen PROAIR HFA 108 (90 Base) MCG/ACT inhaler Inhale 2 puffs into the lungs every 6 (six) hours as needed for wheezing or shortness of breath.  . promethazine  (PHENERGAN) 12.5 MG tablet Take 1 tablet (12.5 mg total) by mouth 3 (three) times daily as needed for nausea or vomiting.  Marland Kitchen QUEtiapine (SEROQUEL) 25 MG tablet Take 2 tablets (50 mg total) by mouth at bedtime.  . tamsulosin (FLOMAX) 0.4 MG CAPS capsule Take 1 capsule (0.4 mg total) by mouth daily.  . traMADol (ULTRAM) 50 MG tablet Take 1 tablet (50 mg total) by mouth every 6 (six) hours as needed for up to 7 days for moderate pain.  . verapamil (VERELAN PM) 120 MG 24 hr capsule Take 120 mg by mouth at bedtime.  Marland Kitchen warfarin (COUMADIN) 2.5 MG tablet Take 2.5 mg by mouth     Review of systems.  In general she not complaining of any fever or chills.  Skin does not complain of rashes itching or increased bruising from baseline.  Head ears eyes nose mouth and throat no complaints of visual changes sore throat or bleeding gums.  Respiratory does not complain of having a cough or shortness of breath.  Cardiac is not complaining of chest pain or increased edema.  GI does not complain of abdominal discomfort nausea vomiting diarrhea constipation.  Musculoskeletal says occasionally she feels like she has some cramps in her legs she says this is been intermittent going on for the past week or 2 otherwise has no complaints.  Neurologic does not complain of dizziness headache syncope or numbness.  Psych is not complaining currently being depressed or anxious.  Physical exam.  Temperature is 97.5 pulse is 59 respirations 19 blood pressure 142/88  In general this is a pleasant elderly female no distress lying comfortably in bed.  Her skin is largely warm and dry her arms are a bit cool to touch but it appears the air conditioning is turned on in the room which I suspect is causing the localized coolness-she does not have any new bruising apparent on her legs.  Eyes visual acuity appears to be intact sclera conjunctive are clear.  Oropharynx is clear mucous membranes moist.  Chest is clear to  auscultation with somewhat shallow air entry there is no labored breathing.  Heart is irregular irregular rate rhythm pulse between 58 and 60  She does not have significant lower extremity edema.  Abdomen is soft nontender with positive bowel sounds.  Musculoskeletal appears able to move her extremities x4 at baseline IV site left upper arm has just minimal amount of bloody drainage on the dressing.  She does have arthritic changes but did not note any significant deformities.  Neurologic is grossly intact her speech is clear cannot appreciate lateralizing findings.  Psych she continues to be pleasant and appropriate  Labs.  April 24, 2019.  WBC 4.3 hemoglobin 13.3 platelets 85,000.  Sodium 147 potassium 3.5 BUN 32.7 creatinine 1.25.  Assessment and  plan.  1.  Elevated INR-her Coumadin is currently on hold INR has come down to 4-at this point continue to monitor and hold Coumadin we will check an INR tomorrow.  She does not have evidence of increased bruising or bleeding --.  2.  Atrial fibrillation this appears rate controlled on Lopressor 37.5 mg twice daily as well as verapamil 120 mg a day-pulse was in the high 50s-60's today-clinically appears to be stable again we are holding Coumadin temporarily because of an elevated INR.  3.  History of some leg cramps which apparently  on and off for a couple weeks --She feels this may be stiffness discomfort from relative immobility-- we have her on bedrest because the elevated INR-but suspect she can fairly quickly come off this if INR continues to go down  -Appears electrolytes other than the sodium were within normal limits on recent metabolic panel-however since she is on IV fluids we will give her potassium while on IVs-her potassium was 3.5 on lab done earlier this week which is low normal We will await metabolic panel tomorrow for any additional changes  4.  History of CHF-we held her Lasix this morning because of the  elevated sodium she does not show evidence of fluid overload but this will have to be monitored-  #5 hypernatremia -- she is on IV fluids with update BMP ordered for tomorrow  [phone]

## 2019-04-28 ENCOUNTER — Encounter: Payer: Self-pay | Admitting: Internal Medicine

## 2019-04-30 ENCOUNTER — Telehealth: Payer: Self-pay | Admitting: Internal Medicine

## 2019-04-30 NOTE — Telephone Encounter (Signed)
INR 3.4 Upon checking chart, nurse noted that coumadin is for afib with goal INR 2-3.   Advised to hold sat and sun and recheck Monday.

## 2019-05-01 ENCOUNTER — Other Ambulatory Visit: Payer: Self-pay | Admitting: *Deleted

## 2019-05-01 ENCOUNTER — Non-Acute Institutional Stay (SKILLED_NURSING_FACILITY): Payer: Medicare Other | Admitting: Internal Medicine

## 2019-05-01 ENCOUNTER — Encounter: Payer: Self-pay | Admitting: Internal Medicine

## 2019-05-01 DIAGNOSIS — R791 Abnormal coagulation profile: Secondary | ICD-10-CM

## 2019-05-01 DIAGNOSIS — I509 Heart failure, unspecified: Secondary | ICD-10-CM | POA: Diagnosis not present

## 2019-05-01 DIAGNOSIS — E870 Hyperosmolality and hypernatremia: Secondary | ICD-10-CM

## 2019-05-01 DIAGNOSIS — I482 Chronic atrial fibrillation, unspecified: Secondary | ICD-10-CM

## 2019-05-01 NOTE — Patient Outreach (Addendum)
Member assessed for potential [facility] Care Management needs as a benefit of  [place] [employer].  Member is currently receiving rehab therapy at [facility].  Chart reviewed. [name] lived with daughter prior.   Notification sent facility discharge planner to make aware that writer is following for potential [facility] Care Management needs.   Will await to hear back from facility dc planner regarding disposition plans.  Marthenia Rolling, MSN-Ed, RN,BSN [place] Acute Care Coordinator [phone] [facility]) [phone]  (Toll free office)

## 2019-05-01 NOTE — Progress Notes (Signed)
Location:   [facility] Service:   SNF  Paz, [name], MD  Patient Care Team: Colon Branch, MD as PCP - General (Internal Medicine)  Extended Emergency Contact Information Primary Emergency Contact: [facility] Address: [address]          [place], [place] [address] [name] of [place] Phone: [phone] Mobile Phone: [phone] Relation: Daughter    Allergies: Patient has no known allergies.  No chief complaint on file.   HPI: Patient is 85 y.o. female who is being seen because on patient's 1 week after admission lab her sodium is 147 and her creatinine is 1.25 which is higher than it has been.  Patient herself has no complaints.  Nurses do not report any symptoms or signs.  Past Medical History:  Diagnosis Date   Acute encephalopathy    Acute renal failure superimposed on stage 3 chronic kidney disease (HCC)    Anemia    Atrial fibrillation ([place])    Colon cancer ([place]) 12/11   Colon cancer ([place]) 2011   Diarrhea    DVT (deep venous thrombosis) ([place]) 11/06   Elevated brain natriuretic peptide (BNP) level    Frequent headaches    Heart murmur    Hyperkalemia    Hypertension    Pulmonary embolism ([place]) 11/06   Seizures ([place])    Stroke ([place]) 11/30/2014   Urine retention 01/2019    Past Surgical History:  Procedure Laterality Date   APPENDECTOMY  1952   COLECTOMY  06/2010   partial, Dr. Donne Hazel complicated by LLE CVT; S/P IVC umbrella & anemia   HIP FRACTURE SURGERY  2006   Trauma   TOTAL ABDOMINAL HYSTERECTOMY W/ BILATERAL SALPINGOOPHORECTOMY  1973   For Fibroids   TOTAL HIP ARTHROPLASTY  01/03/07   Left hip replacement.   VENA CAVA FILTER PLACEMENT  06/28/2010    Allergies as of 04/25/2019   No Known Allergies     Medication List       Accurate as of April 25, 2019 11:59 PM. If you have any questions, ask your nurse or doctor.        acetaminophen 500 MG tablet Commonly known as:  TYLENOL Take 1,000 mg by mouth every 8 (eight) hours as needed for headache (pain).   ALPRAZolam 0.25 MG tablet Commonly known as: XANAX Take 1 tablet (0.25 mg total) by mouth 2 (two) times daily as needed for anxiety. x14 days   aspirin EC 81 MG tablet Take 81 mg by mouth daily.   atorvastatin 20 MG tablet Commonly known as: LIPITOR Take 1 tablet (20 mg total) by mouth daily.   calcium-vitamin D 500-200 MG-UNIT tablet Commonly known as: OSCAL WITH D Take 1 tablet by mouth daily with breakfast.   Dulera 100-5 MCG/ACT Aero Generic drug: mometasone-formoterol Inhale 2 puffs into the lungs 2 (two) times daily.   furosemide 80 MG tablet Commonly known as: LASIX Take 1 tablet (80 mg total) by mouth daily.   isosorbide mononitrate 30 MG 24 hr tablet Commonly known as: IMDUR Take 0.5 tablets (15 mg total) by mouth daily.   loratadine 10 MG tablet Commonly known as: CLARITIN Take 1 tablet (10 mg total) by mouth daily as needed (seasonal allergies).   metoprolol tartrate 25 MG tablet Commonly known as: LOPRESSOR Take 1.5 tablets (37.5 mg total) by mouth 2 (two) times daily.   multivitamin with minerals Tabs tablet Take 1 tablet by mouth daily.   polyethylene glycol 17 g packet  Commonly known as: MIRALAX / GLYCOLAX Take 17 g by mouth daily as needed.   albuterol (2.5 MG/3ML) 0.083% nebulizer solution Commonly known as: PROVENTIL Take 2.5 mg by nebulization every 6 (six) hours as needed for wheezing or shortness of breath.   ProAir HFA 108 (90 Base) MCG/ACT inhaler Generic drug: albuterol Inhale 2 puffs into the lungs every 6 (six) hours as needed for wheezing or shortness of breath.   promethazine 12.5 MG tablet Commonly known as: PHENERGAN Take 1 tablet (12.5 mg total) by mouth 3 (three) times daily as needed for nausea or vomiting.   QUEtiapine 25 MG tablet Commonly known as: SEROQUEL Take 2 tablets (50 mg total) by mouth at bedtime.   tamsulosin 0.4 MG Caps  capsule Commonly known as: FLOMAX Take 1 capsule (0.4 mg total) by mouth daily.   verapamil 120 MG 24 hr capsule Commonly known as: VERELAN PM Take 120 mg by mouth at bedtime.   warfarin 2.5 MG tablet Commonly known as: COUMADIN Take as directed by the anticoagulation clinic. If you are unsure how to take this medication, talk to your nurse or doctor. Original instructions: Take 2.5 mg by mouth daily.       No orders of the defined types were placed in this encounter.   Immunization History  Administered Date(s) Administered   Fluad Quad(high Dose 65+) 03/07/2019   Influenza Split 03/30/2012   Influenza Whole 04/30/2006, 04/20/2008, 04/15/2009   Influenza, High Dose Seasonal PF 04/20/2013, 03/27/2015, 03/31/2016, 04/22/2017, 04/20/2018   Influenza,inj,Quad PF,6+ Mos 05/23/2014   Pneumococcal Conjugate-13 04/24/2015   Pneumococcal Polysaccharide-23 07/06/2009   Td 10/30/2016    Social History   Tobacco Use   Smoking status: Passive Smoke Exposure - Never Smoker   Smokeless tobacco: Never Used   Tobacco comment: husband smoked in home.   Substance Use Topics   Alcohol use: No    Alcohol/week: 0.0 standard drinks    Review of Systems  DATA OBTAINED: from patient, nurse GENERAL:  no fevers, fatigue, appetite changes SKIN: No itching, rash HEENT: No complaint RESPIRATORY: No cough, wheezing, SOB CARDIAC: No chest pain, palpitations, lower extremity edema  GI: No abdominal pain, No N/V/D or constipation, No heartburn or reflux  GU: No dysuria, frequency or urgency, or incontinence  MUSCULOSKELETAL: No unrelieved bone/joint pain NEUROLOGIC: No headache, dizziness  PSYCHIATRIC: No overt anxiety or sadness  Vitals:   05/01/19 2004  BP: 135/72  Pulse: (!) 57  Resp: 18  Temp: 98 F (36.7 C)   Body mass index is 30.11 kg/m. Physical Exam  GENERAL APPEARANCE: Alert, conversant, No acute distress  SKIN: No diaphoresis rash HEENT:  Unremarkable RESPIRATORY: Breathing is even, unlabored. Lung sounds are clear   CARDIOVASCULAR: Heart RRR no murmurs, rubs or gallops. No peripheral edema  GASTROINTESTINAL: Abdomen is soft, non-tender, not distended w/ normal bowel sounds.  GENITOURINARY: Bladder non tender, not distended  MUSCULOSKELETAL: No abnormal joints or musculature NEUROLOGIC: Cranial nerves 2-12 grossly intact. Moves all extremities PSYCHIATRIC: Mood and affect appropriate to situation, no behavioral issues  Patient Active Problem List   Diagnosis Date Noted   Klebsiella cystitis A999333   Metabolic encephalopathy A999333   Back pain 04/08/2019   MCI (mild cognitive impairment) 03/07/2019   Congestive heart failure (CHF) ([place]) 01/20/2019   CAD (coronary artery disease) 06/07/2018   Pulmonary hypertension, primary ([place]) 06/07/2018   Generalized weakness    Abnormal LFTs 06/01/2018   Hallucination 06/01/2018   Abnormal chest x-ray 09/28/2015   Edema of right  lower extremity 09/28/2015   Elevated brain natriuretic peptide (BNP) level 09/17/2015   Seizures ([place]) 09/16/2015   Asthma 08/14/2015   PCP NOTES >>>>>>>>>>>>>>>>>>>>>>>>>>>>>> 04/24/2015   TIA (transient ischemic attack) 12/11/2014   Dyslipidemia 12/11/2014   History of CVA (cerebrovascular accident) 12/01/2014   Thrombocytopenia ([place]) 12/01/2014   Chronic renal insufficiency, stage II (mild) 11/12/2013   Impingement syndrome, shoulder, left 11/09/2013   Acquired short bowel syndrome 11/09/2013   Annual physical exam 08/30/2013   Hx pulmonary embolism 06/27/2013   Warfarin anticoagulation 06/27/2013   DDD (degenerative disc disease), lumbar 11/18/2012   ATRIAL FIBRILLATION   07/08/2010   COLON CANCER, HX OF 07/08/2010   DIZZINESS 10/18/2009   Essential hypertension 12/16/2007   HIP PAIN, CHRONIC 12/28/2006   ANEMIA-NOS 07/15/2006   Personal history of venous thrombosis and embolism 07/15/2006     CMP     Component Value Date/Time   NA 145 04/14/2019 0532   NA 146 06/27/2018   NA 142 06/09/2012 1509   K 3.8 04/14/2019 0532   K 3.9 06/09/2012 1509   CL 109 04/14/2019 0532   CL 108 (H) 06/09/2012 1509   CO2 24 04/14/2019 0532   CO2 26 06/09/2012 1509   GLUCOSE 103 (H) 04/14/2019 0532   GLUCOSE 103 (H) 06/09/2012 1509   BUN 26 (H) 04/14/2019 0532   BUN 47 (A) 06/27/2018   BUN 17.0 06/09/2012 1509   CREATININE 1.13 (H) 04/14/2019 0532   CREATININE 1.51 (H) 07/22/2018 1624   CREATININE 1.2 (H) 06/09/2012 1509   CALCIUM 9.4 04/14/2019 0532   CALCIUM 9.5 06/09/2012 1509   PROT 6.9 04/07/2019 1530   PROT 6.9 01/13/2018 1620   PROT 7.2 06/09/2012 1509   ALBUMIN 4.7 04/07/2019 1530   ALBUMIN 4.6 01/13/2018 1620   ALBUMIN 4.2 06/09/2012 1509   AST 31 04/07/2019 1530   AST 17 06/09/2012 1509   ALT 15 04/07/2019 1530   ALT 11 06/09/2012 1509   ALKPHOS 56 04/07/2019 1530   ALKPHOS 47 06/09/2012 1509   BILITOT 1.4 (H) 04/07/2019 1530   BILITOT 1.1 01/13/2018 1620   BILITOT 0.73 06/09/2012 1509   GFRNONAA 43 (L) 04/14/2019 0532   GFRAA 50 (L) 04/14/2019 0532   Recent Labs    01/22/19 0405 01/24/19 0432  04/08/19 0021  04/11/19 0556 04/12/19 0536 04/14/19 0532  NA 142 144   < > 144   < > 148* 147* 145  K 3.2* 4.7   < > 3.5   < > 4.3 4.1 3.8  CL 105 112*   < > 107   < > 111 108 109  CO2 27 24   < > 27   < > 27 25 24   GLUCOSE 92 107*   < > 114*   < > 87 93 103*  BUN 24* 25*   < > 41*   < > 31* 29* 26*  CREATININE 1.41* 1.33*   < > 1.96*   < > 1.28* 1.26* 1.13*  CALCIUM 8.5* 9.4   < > 9.2   < > 9.0 9.3 9.4  MG 2.1 2.0  --  2.2  --   --   --   --   PHOS 3.4  --   --  4.0  --   --   --   --    < > = values in this interval not displayed.   Recent Labs    01/20/19 0918 03/07/19 1454 04/07/19 1530  AST 25 17 31  ALT 15 10 15   ALKPHOS 41 43 56  BILITOT 0.7 0.8 1.4*  PROT 5.9* 6.6 6.9  ALBUMIN 3.8 4.1 4.7   Recent Labs    01/20/19 0918  03/07/19 1454  04/07/19 1530 04/08/19 0021 04/12/19 0536  WBC 4.6   < > 6.3 8.2 6.9 5.3  NEUTROABS 3.1  --  4.6 6.5  --   --   HGB 12.0   < > 13.3 13.3 12.7 12.9  HCT 38.8   < > 40.5 41.2 40.0 42.0  MCV 97.5   < > 93.6 95.2 97.6 100.0  PLT 76*   < > 89.0* 75* 76* 70*   < > = values in this interval not displayed.   No results for input(s): CHOL, LDLCALC, TRIG in the last 8760 hours.  Invalid input(s): HCL No results found for: [facility] Lab Results  Component Value Date   TSH 3.58 08/22/2018   Lab Results  Component Value Date   HGBA1C 5.6 11/13/2013   Lab Results  Component Value Date   CHOL 97 08/16/2017   HDL 37.70 (L) 08/16/2017   LDLCALC 48 08/16/2017   TRIG 56.0 08/16/2017   CHOLHDL 3 08/16/2017    Significant Diagnostic Results in last 30 days:  Dg Lumbar Spine Complete  Result Date: 04/07/2019 CLINICAL DATA:  Pain EXAM: LUMBAR SPINE - COMPLETE 4+ VIEW COMPARISON:  None. FINDINGS: Leftward scoliosis in the lumbar spine. Diffuse osteopenia. Diffuse degenerative disc and facet disease. No fracture. Aortic calcifications. No aneurysm. IVC filter remains in place, unchanged. IMPRESSION: Leftward scoliosis. Advanced degenerative disc and facet disease diffusely. No acute bony abnormality. Osteopenia. Electronically Signed   By: Rolm Baptise M.D.   On: 04/07/2019 13:36   Dg Chest Port 1 View  Result Date: 04/17/2019 CLINICAL DATA:  Shortness of breath. EXAM: PORTABLE CHEST 1 VIEW COMPARISON:  April 12, 2019 FINDINGS: Mild developing opacity in the right base. The cardiomediastinal silhouette with cardiomegaly is identified and stable. No pneumothorax. No other acute abnormalities. IMPRESSION: Developing opacity in the right base could represent pneumonia. Recommend clinical correlation and short-term follow-up imaging to ensure resolution. Electronically Signed   By: Dorise Bullion III M.D   On: 04/17/2019 13:35   Dg Chest Port 1 View  Result Date: 04/12/2019 CLINICAL DATA:  Atrial  fibrillation EXAM: PORTABLE CHEST 1 VIEW COMPARISON:  01/20/2019 FINDINGS: Cardiac shadow is enlarged but stable. Aortic calcifications are again seen. The lungs are well aerated bilaterally. No focal infiltrate or sizable effusion is seen. No bony abnormality is noted. IMPRESSION: No acute abnormality noted. Electronically Signed   By: Inez Catalina M.D.   On: 04/12/2019 20:14   Ct Renal Stone Study  Result Date: 04/07/2019 CLINICAL DATA:  Bilateral hip, back, flank pain for several days. EXAM: CT ABDOMEN AND PELVIS WITHOUT CONTRAST TECHNIQUE: Multidetector CT imaging of the abdomen and pelvis was performed following the standard protocol without IV contrast. COMPARISON:  08/06/2014 CT abdomen/pelvis. FINDINGS: Lower chest: Scattered small parenchymal bands in the dependent lung bases bilaterally, compatible with mild scarring or atelectasis. Cardiomegaly. Coronary atherosclerosis. Hepatobiliary: Normal liver size. No liver mass. Normal gallbladder with no radiopaque cholelithiasis. No biliary ductal dilatation. Pancreas: Normal, with no mass or duct dilation. Spleen: Normal size. No mass. Adrenals/Urinary Tract: Normal adrenals. Punctate nonobstructing lower left renal stone. No additional renal stones. No hydronephrosis. Slightly hyperdense 1.8 cm renal cortical lesion in the anterior interpolar left kidney with density 53 HU (series 3/image 25), stable since 08/06/2014 CT. No additional contour deforming  renal lesions. Bladder obscured by streak artifact from left hip hardware and nondistended. No gross bladder abnormality. Stomach/Bowel: Normal non-distended stomach. Apparent right hemicolectomy with ileocolic anastomosis in the midline abdomen. No small bowel dilatation or wall thickening. Moderate stool throughout the remnant large-bowel. No large bowel wall thickening, significant diverticulosis or acute pericolonic fat stranding. Vascular/Lymphatic: Atherosclerotic nonaneurysmal abdominal aorta.  Infrarenal IVC filter is stable in position. No pathologically enlarged lymph nodes in the abdomen or pelvis. Reproductive: Status post hysterectomy, with no abnormal findings at the vaginal cuff. No adnexal mass. Other: No pneumoperitoneum, ascites or focal fluid collection. Musculoskeletal: No aggressive appearing focal osseous lesions. Marked thoracolumbar spondylosis. Left total hip arthroplasty. IMPRESSION: 1. No acute abnormality. Apparent right hemicolectomy. No evidence of bowel obstruction or acute bowel inflammation. Moderate stool throughout the remnant large-bowel, suggesting constipation. 2. Punctate nonobstructing lower left renal stone. No hydronephrosis. No ureteral or bladder stones. 3. Slightly hyperdense 1.8 cm renal cortical lesion in the anterior interpolar left kidney. While technically indeterminate, lesion is stable since 2016 CT, probably a benign hemorrhagic/proteinaceous renal cyst. 4. Aortic Atherosclerosis (ICD10-I70.0). Electronically Signed   By: Ilona Sorrel M.D.   On: 04/07/2019 19:50   Dg Hips Bilat W Or Wo Pelvis 2 Views  Result Date: 04/07/2019 CLINICAL DATA:  Bilateral hip pain without known injury. EXAM: DG HIP (WITH OR WITHOUT PELVIS) 2V BILAT COMPARISON:  Radiographs of January 20, 2019. FINDINGS: Status post left total hip arthroplasty. No acute fracture or dislocation is noted. Right hip is unremarkable. IMPRESSION: No acute abnormality seen involving either hip. Status post left total hip arthroplasty. Electronically Signed   By: Marijo Conception M.D.   On: 04/07/2019 13:40    Assessment and Plan  Hypernatremia/elevated creatinine-patient is 85 years old not sure she can drink her way out of this so I have ordered IV fluids with 0.45 normal saline at 75 cc an hour x4 L and to encourage p.o. intake.  [place] up lab on Monday.    [name], MD

## 2019-05-01 NOTE — Progress Notes (Signed)
This is an acute visit.  Level of care skilled.  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  History of present illness.  Patient is a pleasant 85 year old female who is here for rehab after hospitalization for weakness and increased confusion and lower back pain.  She was treated for UTI Klebsiella.  She also had acute kidney injury which responded to IV fluids.  She also has a history of atrial fibrillation as well as a previous history of DVT and pulmonary embolism and is on chronic Coumadin.  She had been fairly stable on 2.5 mg a day with INRs fairly consistently in the lower 2 range--however when we rechecked it last week it was actually up to 5.2 Her Coumadin was held and it did gradually trend down and it is actually 2.0 as of this morning.  She did not really have any increased bruising the only bleeding was from a newly placed IV site which quickly resolved.  She was also noted to have an elevated sodium last week of 147 and she did receive some half-normal saline.  Her Lasix also was held.  This also improved with the lab done last Friday showing a sodium of 144.  She does have a history of CHF-but her edema appears to be stable minimal edema on exam today and she has had her legs in a dependent position but this will have to be watched.  Currently again she is sitting in her chair comfortably has no complaints of increased shortness of breath or edema or bleeding or bruising.  Past Medical History:  Diagnosis Date  . Acute encephalopathy   . Acute renal failure superimposed on stage 3 chronic kidney disease ([place])   . Anemia   . Atrial fibrillation ([place])   . Colon cancer ([place]) 12/11  . Colon cancer ([place]) 2011  . Diarrhea   . DVT (deep venous thrombosis) ([place]) 11/06  . Elevated brain natriuretic peptide (BNP) level   . Frequent headaches   . Heart murmur   . Hyperkalemia    . Hypertension   . Pulmonary embolism ([place]) 11/06  . Seizures ([place])   . Stroke ([place]) 11/30/2014  . Urine retention 01/2019        Past Surgical History:  Procedure Laterality Date  . APPENDECTOMY  1952  . COLECTOMY  06/2010   partial, Dr. Donne Hazel complicated by LLE CVT; S/P IVC umbrella & anemia  . HIP FRACTURE SURGERY  2006   Trauma  . TOTAL ABDOMINAL HYSTERECTOMY W/ BILATERAL SALPINGOOPHORECTOMY  1973   For Fibroids  . TOTAL HIP ARTHROPLASTY  01/03/07   Left hip replacement.  Marland Kitchen VENA CAVA FILTER PLACEMENT  06/28/2010    No Known Allergies      MEDICATIONS   Medication Sig  . acetaminophen (TYLENOL) 500 MG tablet Take 1,000 mg by mouth every 8 (eight) hours as needed for headache (pain).   Marland Kitchen albuterol (PROVENTIL) (2.5 MG/3ML) 0.083% nebulizer solution Take 2.5 mg by nebulization every 6 (six) hours as needed for wheezing or shortness of breath.  Marland Kitchen aspirin EC 81 MG tablet Take 81 mg by mouth daily.  Marland Kitchen atorvastatin (LIPITOR) 20 MG tablet Take 1 tablet (20 mg total) by mouth daily.  . calcium-vitamin D (OSCAL WITH D) 500-200 MG-UNIT tablet Take 1 tablet by mouth daily with breakfast.  . furosemide (LASIX) 80 MG tablet Take 1 tablet (80 mg total) by mouth daily.  . isosorbide mononitrate (IMDUR) 30 MG 24  hr tablet Take 0.5 tablets (15 mg total) by mouth daily.  Marland Kitchen loratadine (CLARITIN) 10 MG tablet Take 1 tablet (10 mg total) by mouth daily as needed (seasonal allergies).  . metoprolol tartrate (LOPRESSOR) 25 MG tablet Take 1.5 tablets (37.5 mg total) by mouth 2 (two) times daily.  . mometasone-formoterol (DULERA) 100-5 MCG/ACT AERO Inhale 2 puffs into the lungs 2 (two) times daily.  . Multiple Vitamin (MULTIVITAMIN WITH MINERALS) TABS tablet Take 1 tablet by mouth daily.  . polyethylene glycol (MIRALAX / GLYCOLAX) packet Take 17 g by mouth daily as needed.  Marland Kitchen PROAIR HFA 108 (90 Base) MCG/ACT inhaler Inhale 2 puffs into the lungs every 6 (six) hours as  needed for wheezing or shortness of breath.  . promethazine (PHENERGAN) 12.5 MG tablet Take 1 tablet (12.5 mg total) by mouth 3 (three) times daily as needed for nausea or vomiting.  Marland Kitchen QUEtiapine (SEROQUEL) 25 MG tablet Take 2 tablets (50 mg total) by mouth at bedtime.  . tamsulosin (FLOMAX) 0.4 MG CAPS capsule Take 1 capsule (0.4 mg total) by mouth daily.  . traMADol (ULTRAM) 50 MG tablet Take 1 tablet (50 mg total) by mouth every 6 (six) hours as needed for up to 7 days for moderate pain.  . verapamil (VERELAN PM) 120 MG 24 hr capsule Take 120 mg by mouth at bedtime.  Marland Kitchen warfarin (COUMADIN) 2.5 MG tablet Take 2.5 mg by mouth    Review of systems.  General she is not complaining of any fever or chills.  Skin does not complain of rashes or itching or any increased bruising or bleeding.  Head ears eyes nose mouth and throat does not complain of visual changes or sore throat.  Respiratory denies shortness of breath or cough.  Cardiac is not complaining of chest pain or significantly increased edema-she has some very mild edema she attributes to having her legs in a dependent position.  GI is not complaining of abdominal discomfort nausea vomiting diarrhea constipation.  GU no complaints of dysuria.  Musculoskeletal is not complaining of joint pain.  Neurologic is not complaining of dizziness headache numbness or syncopal type feelings  Psych she does not complain of being depressed or anxious appears to be in her usual good spirits staff she does have orders for Seroquel at night apparently for history of behaviors.  Physical exam.  Temperature is 97.1 pulse 70 respirations 18 blood pressure 120/62 oxygen saturation is 95% on room air.  In general this is a pleasant elderly female no distress sitting comfortably in her chair.  Her skin is warm and dry-I did not see any increased bruising from baseline she has a small bruise left upper arm where the IV site was there is no elevation  to this is actually appears rather old and resolving.  Eyes visual acuity appears to be intact sclera and conjunctive are clear.  Oropharynx clear mucous membranes moist.  Chest is clear to auscultation there is no labored breathing.  Heart is irregularly irregular rate and rhythm without murmur gallop or rub edema continues to be fairly minimal-she does have some very mild edema above her sock line.  Abdomen is soft nontender with positive bowel sounds.  Musculoskeletal is able to move all extremities x4 it appears at baseline somewhat limited exam since she is sitting in a chair but did not note any significant changes.  Neurologic is grossly intact her speech is clear cannot really appreciate lateralizing findings.  Psych she continues to be very pleasant and  appropriate.  Labs.  April 28, 2019.  Sodium 144 potassium 4 BUN 18.7 creatinine 0.91.  April 24, 2019.  WBC 4.3 hemoglobin 13.3 platelets 85,000.  At that time sodium was 147-potassium 3.5- BUN 32.7-creatinine 1.25.  Assessment plan.  1.  History of elevated INR with previous history of atrial fibrillation as well as DVT and pulmonary embolism-INR is now in therapeutic range-will restart Coumadin at 2 mg a day and have this rechecked on Friday, October 30-.  Also she had been on low-dose aspirin will restart this as well this was held initially because of risk of bleeding.  2.-History of hypernatremia this appears to be improved   status post IV fluids-her Lasix is currently held she does not really show evidence of increased edema or CHF decompensation but this will have to be watched--- at this point continue to hold Coumadin secondary to her electrolyte issues will have the metabolic panel rechecked on Thursday, October 29   #3 history of atrial fibrillation this appears to be rate controlled she is on Lopressor 37.5 mg twice a day as well as verapamil 120 mg a day.  4.  History of pulmonary embolus and DVT  again her Coumadin is being restarted INR is now therapeutic update INR has been ordered for later this week.  #5 history of CHF-at this point appears to be compensated however this will have to be watched since she is currently off Lasix-this was discussed with nursing as well  [phone]

## 2019-05-03 ENCOUNTER — Other Ambulatory Visit: Payer: Self-pay | Admitting: *Deleted

## 2019-05-03 NOTE — Patient Outreach (Signed)
Late entry for 05/02/19  Member assessed for potential [facility] Care Management needs as a benefit of [place] Medicare.  [facility] discharge planner. [name] is currently receiving rehab therapy at [facility].  Per facility discharge planner, member has a very supportive daughter. Disposition plan will be to return home with daughter.  Discussed that writer will continue to follow for disposition plans and for potential [facility] Care Management needs.   Marthenia Rolling, MSN-Ed, RN,BSN [place] Acute Care Coordinator [phone] [facility]) [phone]  (Toll free office)

## 2019-05-04 ENCOUNTER — Encounter: Payer: Self-pay | Admitting: Internal Medicine

## 2019-05-04 ENCOUNTER — Non-Acute Institutional Stay (SKILLED_NURSING_FACILITY): Payer: Medicare Other | Admitting: Internal Medicine

## 2019-05-04 DIAGNOSIS — Z87440 Personal history of urinary (tract) infections: Secondary | ICD-10-CM | POA: Diagnosis not present

## 2019-05-04 DIAGNOSIS — I482 Chronic atrial fibrillation, unspecified: Secondary | ICD-10-CM | POA: Diagnosis not present

## 2019-05-04 DIAGNOSIS — R4689 Other symptoms and signs involving appearance and behavior: Secondary | ICD-10-CM

## 2019-05-04 DIAGNOSIS — I509 Heart failure, unspecified: Secondary | ICD-10-CM

## 2019-05-04 NOTE — Progress Notes (Deleted)
Location:   [facility] Room Number: 100/P Place of Service:  SNF (31)  Provider: Lorne Skeens  PCP: Colon Branch, MD Patient Care Team: Colon Branch, MD as PCP - General (Internal Medicine)  Extended Emergency Contact Information Primary Emergency Contact: [name],[name] Address: [address]          [place], [place] [address] [name] of [place] Phone: [phone] Mobile Phone: [phone] Relation: Daughter  Code Status: Full Code Goals of care:  Advanced Directive information Advanced Directives 05/04/2019  Does Patient Have a Medical Advance Directive? Yes  Type of Advance Directive (No Data)  Does patient want to make changes to medical advance directive? -  Copy of [place] in Chart? -  Would patient like information on creating a medical advance directive? -     No Known Allergies  Chief Complaint  Patient presents with   Discharge Note    Discharge Visit    HPI:  85 y.o. female      Past Medical History:  Diagnosis Date   Acute encephalopathy    Acute renal failure superimposed on stage 3 chronic kidney disease ([place])    Anemia    Atrial fibrillation ([place])    Colon cancer ([place]) 12/11   Colon cancer ([place]) 2011   Diarrhea    DVT (deep venous thrombosis) ([place]) 11/06   Elevated brain natriuretic peptide (BNP) level    Frequent headaches    Heart murmur    Hyperkalemia    Hypertension    Pulmonary embolism ([place]) 11/06   Seizures ([place])    Stroke ([place]) 11/30/2014   Urine retention 01/2019    Past Surgical History:  Procedure Laterality Date   APPENDECTOMY  1952   COLECTOMY  06/2010   partial, Dr. Donne Hazel complicated by LLE CVT; S/P IVC umbrella & anemia   HIP FRACTURE SURGERY  2006   Trauma   TOTAL ABDOMINAL HYSTERECTOMY W/ BILATERAL SALPINGOOPHORECTOMY  1973   For Fibroids   TOTAL HIP ARTHROPLASTY  01/03/07   Left hip replacement.   VENA CAVA FILTER  PLACEMENT  06/28/2010      reports that she is a non-smoker but has been exposed to tobacco smoke. She has never used smokeless tobacco. She reports that she does not drink alcohol or use drugs. Social History   Socioeconomic History   Marital status: Widowed    Spouse name: Not on file   Number of children: 1   Years of education: Not on file   Highest education level: Not on file  Occupational History   Occupation: n/d    Fish farm manager: RETIRED  Social Needs   Financial resource strain: Not on file   Food insecurity    Worry: Not on file    Inability: Not on file   Transportation needs    Medical: Not on file    Non-medical: Not on file  Tobacco Use   Smoking status: Passive Smoke Exposure - Never Smoker   Smokeless tobacco: Never Used   Tobacco comment: husband smoked in home.   Substance and Sexual Activity   Alcohol use: No    Alcohol/week: 0.0 standard drinks   Drug use: No   Sexual activity: Not Currently  Lifestyle   Physical activity    Days per week: Not on file    Minutes per session: Not on file   Stress: Not on file  Relationships   Social connections    Talks on phone: Not  on file    Gets together: Not on file    Attends religious service: Not on file    Active member of club or organization: Not on file    Attends meetings of clubs or organizations: Not on file    Relationship status: Not on file   Intimate partner violence    Fear of current or ex partner: Not on file    Emotionally abused: Not on file    Physically abused: Not on file    Forced sexual activity: Not on file  Other Topics Concern   Not on file  Social History Narrative   Lives w/ her daughter    Functional Status Survey:    No Known Allergies  Pertinent  Health Maintenance Due  Topic Date Due   DEXA SCAN  02/27/2020 (Originally 11/26/1993)   INFLUENZA VACCINE  Completed   PNA vac Low Risk Adult  Completed    Medications: Allergies as of 05/04/2019     No Known Allergies     Medication List       Accurate as of May 04, 2019  4:13 PM. If you have any questions, ask your nurse or doctor.        STOP taking these medications   ALPRAZolam 0.25 MG tablet Commonly known as: XANAX Stopped by: Granville Lewis, PA-C   furosemide 80 MG tablet Commonly known as: LASIX Stopped by: Granville Lewis, PA-C   potassium chloride SA 20 MEQ tablet Commonly known as: KLOR-CON Stopped by: Granville Lewis, PA-C   sodium chloride 0.45 % solution Stopped by: Granville Lewis, PA-C     TAKE these medications   acetaminophen 500 MG tablet Commonly known as: TYLENOL Take 1,000 mg by mouth every 8 (eight) hours as needed for headache (pain).   aspirin 81 MG chewable tablet Chew 81 mg by mouth daily.   atorvastatin 20 MG tablet Commonly known as: LIPITOR Take 1 tablet (20 mg total) by mouth daily.   calcium-vitamin D 500-200 MG-UNIT tablet Commonly known as: OSCAL WITH D Take 1 tablet by mouth daily with breakfast.   Dulera 100-5 MCG/ACT Aero Generic drug: mometasone-formoterol Inhale 2 puffs into the lungs 2 (two) times daily.   Ensure Take 237 mLs by mouth 2 (two) times daily.   isosorbide mononitrate 30 MG 24 hr tablet Commonly known as: IMDUR Take 0.5 tablets (15 mg total) by mouth daily.   loratadine 10 MG tablet Commonly known as: CLARITIN Take 1 tablet (10 mg total) by mouth daily as needed (seasonal allergies).   metoprolol tartrate 25 MG tablet Commonly known as: LOPRESSOR Take 1.5 tablets (37.5 mg total) by mouth 2 (two) times daily.   multivitamin with minerals Tabs tablet Take 1 tablet by mouth daily.   nystatin powder Commonly known as: MYCOSTATIN/NYSTOP Apply topically daily. Apply under bilateral breasts QD   polyethylene glycol 17 g packet Commonly known as: MIRALAX / GLYCOLAX Take 17 g by mouth daily as needed.   albuterol (2.5 MG/3ML) 0.083% nebulizer solution Commonly known as: PROVENTIL Take 2.5 mg by  nebulization every 6 (six) hours as needed for wheezing or shortness of breath.   ProAir HFA 108 (90 Base) MCG/ACT inhaler Generic drug: albuterol Inhale 2 puffs into the lungs every 6 (six) hours as needed for wheezing or shortness of breath.   promethazine 12.5 MG tablet Commonly known as: PHENERGAN Take 1 tablet (12.5 mg total) by mouth 3 (three) times daily as needed for nausea or vomiting.   QUEtiapine 25 MG tablet  Commonly known as: SEROQUEL Take 2 tablets (50 mg total) by mouth at bedtime.   tamsulosin 0.4 MG Caps capsule Commonly known as: FLOMAX Take 1 capsule (0.4 mg total) by mouth daily.   verapamil 120 MG 24 hr capsule Commonly known as: VERELAN PM Take 120 mg by mouth at bedtime.   warfarin 2 MG tablet Commonly known as: COUMADIN Take as directed by the anticoagulation clinic. If you are unsure how to take this medication, talk to your nurse or doctor. Original instructions: Take 2 mg by mouth daily. FOR AFIB       Review of Systems  Vitals:   05/04/19 1601  BP: (!) 141/82  Pulse: 70  Resp: 18  Temp: 97.9 F (36.6 C)  TempSrc: Oral  SpO2: 100%  Weight: 171 lb 3.2 oz (77.7 kg)  Height: 5\' 3"  (1.6 m)   Body mass index is 30.33 kg/m. Physical Exam  Labs reviewed: Basic Metabolic Panel: Recent Labs    01/22/19 0405 01/24/19 0432  04/08/19 0021  04/11/19 0556 04/12/19 0536 04/14/19 0532  NA 142 144   < > 144   < > 148* 147* 145  K 3.2* 4.7   < > 3.5   < > 4.3 4.1 3.8  CL 105 112*   < > 107   < > 111 108 109  CO2 27 24   < > 27   < > 27 25 24   GLUCOSE 92 107*   < > 114*   < > 87 93 103*  BUN 24* 25*   < > 41*   < > 31* 29* 26*  CREATININE 1.41* 1.33*   < > 1.96*   < > 1.28* 1.26* 1.13*  CALCIUM 8.5* 9.4   < > 9.2   < > 9.0 9.3 9.4  MG 2.1 2.0  --  2.2  --   --   --   --   PHOS 3.4  --   --  4.0  --   --   --   --    < > = values in this interval not displayed.   Liver Function Tests: Recent Labs    01/20/19 0918 03/07/19 1454  04/07/19 1530  AST 25 17 31   ALT 15 10 15   ALKPHOS 41 43 56  BILITOT 0.7 0.8 1.4*  PROT 5.9* 6.6 6.9  ALBUMIN 3.8 4.1 4.7   No results for input(s): LIPASE, AMYLASE in the last 8760 hours. No results for input(s): AMMONIA in the last 8760 hours. CBC: Recent Labs    01/20/19 0918  03/07/19 1454 04/07/19 1530 04/08/19 0021 04/12/19 0536  WBC 4.6   < > 6.3 8.2 6.9 5.3  NEUTROABS 3.1  --  4.6 6.5  --   --   HGB 12.0   < > 13.3 13.3 12.7 12.9  HCT 38.8   < > 40.5 41.2 40.0 42.0  MCV 97.5   < > 93.6 95.2 97.6 100.0  PLT 76*   < > 89.0* 75* 76* 70*   < > = values in this interval not displayed.   Cardiac Enzymes: Recent Labs    06/02/18 0223 06/04/18 0731 06/04/18 1428 06/04/18 1818 06/05/18 0030  CKTOTAL 557* 154  --   --   --   TROPONINI  --   --  <0.03 <0.03 <0.03   BNP: Invalid input(s): POCBNP CBG: Recent Labs    06/03/18 2109 06/04/18 0757 06/04/18 1144  GLUCAP 122* 77 99    Procedures  and Imaging Studies During Stay: Dg Lumbar Spine Complete  Result Date: 04/07/2019 CLINICAL DATA:  Pain EXAM: LUMBAR SPINE - COMPLETE 4+ VIEW COMPARISON:  None. FINDINGS: Leftward scoliosis in the lumbar spine. Diffuse osteopenia. Diffuse degenerative disc and facet disease. No fracture. Aortic calcifications. No aneurysm. IVC filter remains in place, unchanged. IMPRESSION: Leftward scoliosis. Advanced degenerative disc and facet disease diffusely. No acute bony abnormality. Osteopenia. Electronically Signed   By: Rolm Baptise M.D.   On: 04/07/2019 13:36   Dg Chest Port 1 View  Result Date: 04/17/2019 CLINICAL DATA:  Shortness of breath. EXAM: PORTABLE CHEST 1 VIEW COMPARISON:  April 12, 2019 FINDINGS: Mild developing opacity in the right base. The cardiomediastinal silhouette with cardiomegaly is identified and stable. No pneumothorax. No other acute abnormalities. IMPRESSION: Developing opacity in the right base could represent pneumonia. Recommend clinical correlation and  short-term follow-up imaging to ensure resolution. Electronically Signed   By: Dorise Bullion III M.D   On: 04/17/2019 13:35   Dg Chest Port 1 View  Result Date: 04/12/2019 CLINICAL DATA:  Atrial fibrillation EXAM: PORTABLE CHEST 1 VIEW COMPARISON:  01/20/2019 FINDINGS: Cardiac shadow is enlarged but stable. Aortic calcifications are again seen. The lungs are well aerated bilaterally. No focal infiltrate or sizable effusion is seen. No bony abnormality is noted. IMPRESSION: No acute abnormality noted. Electronically Signed   By: Inez Catalina M.D.   On: 04/12/2019 20:14   Ct Renal Stone Study  Result Date: 04/07/2019 CLINICAL DATA:  Bilateral hip, back, flank pain for several days. EXAM: CT ABDOMEN AND PELVIS WITHOUT CONTRAST TECHNIQUE: Multidetector CT imaging of the abdomen and pelvis was performed following the standard protocol without IV contrast. COMPARISON:  08/06/2014 CT abdomen/pelvis. FINDINGS: Lower chest: Scattered small parenchymal bands in the dependent lung bases bilaterally, compatible with mild scarring or atelectasis. Cardiomegaly. Coronary atherosclerosis. Hepatobiliary: Normal liver size. No liver mass. Normal gallbladder with no radiopaque cholelithiasis. No biliary ductal dilatation. Pancreas: Normal, with no mass or duct dilation. Spleen: Normal size. No mass. Adrenals/Urinary Tract: Normal adrenals. Punctate nonobstructing lower left renal stone. No additional renal stones. No hydronephrosis. Slightly hyperdense 1.8 cm renal cortical lesion in the anterior interpolar left kidney with density 53 HU (series 3/image 25), stable since 08/06/2014 CT. No additional contour deforming renal lesions. Bladder obscured by streak artifact from left hip hardware and nondistended. No gross bladder abnormality. Stomach/Bowel: Normal non-distended stomach. Apparent right hemicolectomy with ileocolic anastomosis in the midline abdomen. No small bowel dilatation or wall thickening. Moderate stool  throughout the remnant large-bowel. No large bowel wall thickening, significant diverticulosis or acute pericolonic fat stranding. Vascular/Lymphatic: Atherosclerotic nonaneurysmal abdominal aorta. Infrarenal IVC filter is stable in position. No pathologically enlarged lymph nodes in the abdomen or pelvis. Reproductive: Status post hysterectomy, with no abnormal findings at the vaginal cuff. No adnexal mass. Other: No pneumoperitoneum, ascites or focal fluid collection. Musculoskeletal: No aggressive appearing focal osseous lesions. Marked thoracolumbar spondylosis. Left total hip arthroplasty. IMPRESSION: 1. No acute abnormality. Apparent right hemicolectomy. No evidence of bowel obstruction or acute bowel inflammation. Moderate stool throughout the remnant large-bowel, suggesting constipation. 2. Punctate nonobstructing lower left renal stone. No hydronephrosis. No ureteral or bladder stones. 3. Slightly hyperdense 1.8 cm renal cortical lesion in the anterior interpolar left kidney. While technically indeterminate, lesion is stable since 2016 CT, probably a benign hemorrhagic/proteinaceous renal cyst. 4. Aortic Atherosclerosis (ICD10-I70.0). Electronically Signed   By: Ilona Sorrel M.D.   On: 04/07/2019 19:50   Dg Hips Bilat W Or Wo  Pelvis 2 Views  Result Date: 04/07/2019 CLINICAL DATA:  Bilateral hip pain without known injury. EXAM: DG HIP (WITH OR WITHOUT PELVIS) 2V BILAT COMPARISON:  Radiographs of January 20, 2019. FINDINGS: Status post left total hip arthroplasty. No acute fracture or dislocation is noted. Right hip is unremarkable. IMPRESSION: No acute abnormality seen involving either hip. Status post left total hip arthroplasty. Electronically Signed   By: Marijo Conception M.D.   On: 04/07/2019 13:40    Assessment/Plan:   There are no diagnoses linked to this encounter.     Future labs/tests needed:

## 2019-05-04 NOTE — Progress Notes (Signed)
Location:   [facility] Room Number: 100/P Place of Service:  SNF [phone]) Provider:  Norma Fredrickson, MD  Patient Care Team: Colon Branch, MD as PCP - General (Internal Medicine)  Extended Emergency Contact Information Primary Emergency Contact: [name],[name] Address: [address]          [place], [place] [address] [name] of [place] Phone: [phone] Mobile Phone: [phone] Relation: Daughter  Code Status:  Full Code Goals of care: Advanced Directive information Advanced Directives 05/04/2019  Does Patient Have a Medical Advance Directive? Yes  Type of Advance Directive (No Data)  Does patient want to make changes to medical advance directive? -  Copy of [place] in Chart? -  Would patient like information on creating a medical advance directive? -     Chief Complaint  Patient presents with   Acute Visit    Change in mental status    HPI:  Pt is a 85 y.o. female seen today for an acute visit for what appears to be something increased confusion and agitation. She is here for rehab after hospitalization for weakness and increased confusion lower back pain.  She was treated for a UTI.  She also had acute kidney injury which responded to IV fluids.  She also has a history of atrial fibrillation as well as DVT and pulmonary embolism and is on chronic Coumadin-at one point her INR was supratherapeutic at over 5 but it was held and Coumadin has been restarted secondary to an INR of 2.0 earlier this week update INR is pending.  She also was recently treated with IV fluids secondary to and elevated sodium which did come down to 144 on October 23  Patient usually is quite pleasant when I examined her and she is cooperative with nursing-- however nursing states that she would not really take her medicines today and was somewhat agitated.  When I examined her she was somewhat agitated she did allow a  limited exam but said that she was taking enough medicines and did not need to take any at this time.  Vital signs continue to be stable she is afebrile.      Past Medical History:  Diagnosis Date   Acute encephalopathy    Acute renal failure superimposed on stage 3 chronic kidney disease (HCC)    Anemia    Atrial fibrillation ([place])    Colon cancer ([place]) 12/11   Colon cancer ([place]) 2011   Diarrhea    DVT (deep venous thrombosis) ([place] [facility]) 11/06   Elevated brain natriuretic peptide (BNP) level    Frequent headaches    Heart murmur    Hyperkalemia    Hypertension    Pulmonary embolism ([facility]) 11/06   Seizures ([place])    Stroke ([facility]) 11/30/2014   Urine retention 01/2019   Past Surgical History:  Procedure Laterality Date   APPENDECTOMY  1952   COLECTOMY  06/2010   partial, Dr. Donne Hazel complicated by LLE CVT; S/P IVC umbrella & anemia   HIP FRACTURE SURGERY  2006   Trauma   TOTAL ABDOMINAL HYSTERECTOMY W/ BILATERAL SALPINGOOPHORECTOMY  1973   For Fibroids   TOTAL HIP ARTHROPLASTY  01/03/07   Left hip replacement.   VENA CAVA FILTER PLACEMENT  06/28/2010    No Known Allergies  Outpatient Encounter Medications as of 05/04/2019  Medication Sig   acetaminophen (TYLENOL) 500 MG tablet Take 1,000 mg by mouth every 8 (eight) hours as needed  for headache (pain).    albuterol (PROVENTIL) (2.5 MG/3ML) 0.083% nebulizer solution Take 2.5 mg by nebulization every 6 (six) hours as needed for wheezing or shortness of breath.   aspirin 81 MG chewable tablet Chew 81 mg by mouth daily.   atorvastatin (LIPITOR) 20 MG tablet Take 1 tablet (20 mg total) by mouth daily.   calcium-vitamin D (OSCAL WITH D) 500-200 MG-UNIT tablet Take 1 tablet by mouth daily with breakfast.   Ensure (ENSURE) Take 237 mLs by mouth 2 (two) times daily.   isosorbide mononitrate (IMDUR) 30 MG 24 hr tablet Take 0.5 tablets (15 mg total) by mouth daily.   loratadine (CLARITIN) 10 MG tablet  Take 1 tablet (10 mg total) by mouth daily as needed (seasonal allergies).   metoprolol tartrate (LOPRESSOR) 25 MG tablet Take 1.5 tablets (37.5 mg total) by mouth 2 (two) times daily.   mometasone-formoterol (DULERA) 100-5 MCG/ACT AERO Inhale 2 puffs into the lungs 2 (two) times daily.   Multiple Vitamin (MULTIVITAMIN WITH MINERALS) TABS tablet Take 1 tablet by mouth daily.   nystatin (MYCOSTATIN/NYSTOP) powder Apply topically daily. Apply under bilateral breasts QD   polyethylene glycol (MIRALAX / GLYCOLAX) packet Take 17 g by mouth daily as needed.   PROAIR HFA 108 (90 Base) MCG/ACT inhaler Inhale 2 puffs into the lungs every 6 (six) hours as needed for wheezing or shortness of breath.   promethazine (PHENERGAN) 12.5 MG tablet Take 1 tablet (12.5 mg total) by mouth 3 (three) times daily as needed for nausea or vomiting.   QUEtiapine (SEROQUEL) 25 MG tablet Take 2 tablets (50 mg total) by mouth at bedtime.   tamsulosin (FLOMAX) 0.4 MG CAPS capsule Take 1 capsule (0.4 mg total) by mouth daily.   verapamil (VERELAN PM) 120 MG 24 hr capsule Take 120 mg by mouth at bedtime.   warfarin (COUMADIN) 2 MG tablet Take 2 mg by mouth daily. FOR AFIB   [DISCONTINUED] ALPRAZolam (XANAX) 0.25 MG tablet Take 1 tablet (0.25 mg total) by mouth 2 (two) times daily as needed for anxiety. x14 days   [DISCONTINUED] furosemide (LASIX) 80 MG tablet Take 1 tablet (80 mg total) by mouth daily.   [DISCONTINUED] potassium chloride SA (KLOR-CON) 20 MEQ tablet Take 20 mEq by mouth 2 (two) times daily. Take while on IV fluids   [DISCONTINUED] sodium chloride 0.45 % solution Inject 75 mLs into the vein continuous. x4 liters   No facility-administered encounter medications on file as of 05/04/2019.     Review of Systems   This is somewhat limited since patient is somewhat agitated and appears to be more confused.  General she is not complaining of fever chills nursing has not noted any elevated  temperature.  Head ears eyes nose mouth and throat she does not complain of visual changes or difficulty swallowing or sore throat.  Respiratory she denies being short of breath or having cough.  Cardiac denies any chest pain does not really have significant edema.  GI is not complaining of abdominal discomfort nausea vomiting diarrhea constipation.  GU does have a history of UTIs but does not complain of dysuria or back pain.  Musculoskeletal does not really complain of back pain or joint pain.  Neurologic does not complain of feeling dizzy ear having a headache or being syncopal-.  Psych again she has had some increased agitation and confusion she did allow a limited physical exam  Immunization History  Administered Date(s) Administered   Fluad Quad(high Dose 65+) 03/07/2019   Influenza  Split 03/30/2012   Influenza Whole 04/30/2006, 04/20/2008, 04/15/2009   Influenza, High Dose Seasonal PF 04/20/2013, 03/27/2015, 03/31/2016, 04/22/2017, 04/20/2018   Influenza,inj,Quad PF,6+ Mos 05/23/2014   Pneumococcal Conjugate-13 04/24/2015   Pneumococcal Polysaccharide-23 07/06/2009   Td 10/30/2016   Pertinent  Health Maintenance Due  Topic Date Due   DEXA SCAN  02/27/2020 (Originally 11/26/1993)   INFLUENZA VACCINE  Completed   PNA vac Low Risk Adult  Completed   Fall Risk  07/22/2018 01/18/2018 10/30/2016 08/14/2015 04/24/2015  Falls in the past year? 1 No No No No  Number falls in past yr: 1 - - - -  Injury with Fall? 0 - - - -   Functional Status Survey:    Vitals:   05/04/19 1601  BP: (!) 141/82  Pulse: 70  Resp: 18  Temp: 97.9 F (36.6 C)  TempSrc: Oral  SpO2: 100%  Weight: 171 lb 3.2 oz (77.7 kg)  Height: 5\' 3"  (1.6 m)  Blood pressure is 109/82-pulse is 81 Body mass index is 30.33 kg/m. Physical Exam   In general this is a pleasant elderly female in no distress lying comfortably in bed she is a little agitated however with any attempted exam.  Her skin is  warm and dry.  Eyes visual acuity appears grossly intact sclera and conjunctive are clear.  Oropharynx is clear mucous membranes moist.  Chest is clear to auscultation there is no labored breathing.  Heart is irregular irregular rate and rhythm she does not really have significant edema.  Abdomen is soft nontender with positive bowel sounds Limited exam since she only allowed a cursory exam of her abdomen.  GU could not really appreciate suprapubic tenderness or discharge.  Musculoskeletal Limited exam since she is in bed but appears able to move all her extremities x4 relative baseline.  Psych is grossly intact cannot really appreciate lateralizing findings her speech is clear cranial nerves appear to be intact.  Psych she is oriented to self-her behavior has changed since she is somewhat agitated-refusing her medicines saying she has taken enough medicines for now does not need anymore.    Labs reviewed:  April 28, 2019.  Sodium 144 potassium 4 BUN 18.7 creatinine 0.91.  April 24, 2019.  WBC 4.3 hemoglobin 13.3 platelets 85,000   Recent Labs    01/22/19 0405 01/24/19 0432  04/08/19 0021  04/11/19 0556 04/12/19 0536 04/14/19 0532  NA 142 144   < > 144   < > 148* 147* 145  K 3.2* 4.7   < > 3.5   < > 4.3 4.1 3.8  CL 105 112*   < > 107   < > 111 108 109  CO2 27 24   < > 27   < > 27 25 24   GLUCOSE 92 107*   < > 114*   < > 87 93 103*  BUN 24* 25*   < > 41*   < > 31* 29* 26*  CREATININE 1.41* 1.33*   < > 1.96*   < > 1.28* 1.26* 1.13*  CALCIUM 8.5* 9.4   < > 9.2   < > 9.0 9.3 9.4  MG 2.1 2.0  --  2.2  --   --   --   --   PHOS 3.4  --   --  4.0  --   --   --   --    < > = values in this interval not displayed.   Recent Labs    01/20/19 [phone]  03/07/19 1454 04/07/19 1530  AST 25 17 31   ALT 15 10 15   ALKPHOS 41 43 56  BILITOT 0.7 0.8 1.4*  PROT 5.9* 6.6 6.9  ALBUMIN 3.8 4.1 4.7   Recent Labs    01/20/19 0918  03/07/19 1454 04/07/19 1530 04/08/19 0021  04/12/19 0536  WBC 4.6   < > 6.3 8.2 6.9 5.3  NEUTROABS 3.1  --  4.6 6.5  --   --   HGB 12.0   < > 13.3 13.3 12.7 12.9  HCT 38.8   < > 40.5 41.2 40.0 42.0  MCV 97.5   < > 93.6 95.2 97.6 100.0  PLT 76*   < > 89.0* 75* 76* 70*   < > = values in this interval not displayed.   Lab Results  Component Value Date   TSH 3.58 08/22/2018   Lab Results  Component Value Date   HGBA1C 5.6 11/13/2013   Lab Results  Component Value Date   CHOL 97 08/16/2017   HDL 37.70 (L) 08/16/2017   LDLCALC 48 08/16/2017   TRIG 56.0 08/16/2017   CHOLHDL 3 08/16/2017    Significant Diagnostic Results in last 30 days:  Dg Lumbar Spine Complete  Result Date: 04/07/2019 CLINICAL DATA:  Pain EXAM: LUMBAR SPINE - COMPLETE 4+ VIEW COMPARISON:  None. FINDINGS: Leftward scoliosis in the lumbar spine. Diffuse osteopenia. Diffuse degenerative disc and facet disease. No fracture. Aortic calcifications. No aneurysm. IVC filter remains in place, unchanged. IMPRESSION: Leftward scoliosis. Advanced degenerative disc and facet disease diffusely. No acute bony abnormality. Osteopenia. Electronically Signed   By: Rolm Baptise M.D.   On: 04/07/2019 13:36   Dg Chest Port 1 View  Result Date: 04/17/2019 CLINICAL DATA:  Shortness of breath. EXAM: PORTABLE CHEST 1 VIEW COMPARISON:  April 12, 2019 FINDINGS: Mild developing opacity in the right base. The cardiomediastinal silhouette with cardiomegaly is identified and stable. No pneumothorax. No other acute abnormalities. IMPRESSION: Developing opacity in the right base could represent pneumonia. Recommend clinical correlation and short-term follow-up imaging to ensure resolution. Electronically Signed   By: Dorise Bullion III M.D   On: 04/17/2019 13:35   Dg Chest Port 1 View  Result Date: 04/12/2019 CLINICAL DATA:  Atrial fibrillation EXAM: PORTABLE CHEST 1 VIEW COMPARISON:  01/20/2019 FINDINGS: Cardiac shadow is enlarged but stable. Aortic calcifications are again seen. The  lungs are well aerated bilaterally. No focal infiltrate or sizable effusion is seen. No bony abnormality is noted. IMPRESSION: No acute abnormality noted. Electronically Signed   By: Inez Catalina M.D.   On: 04/12/2019 20:14   Ct Renal Stone Study  Result Date: 04/07/2019 CLINICAL DATA:  Bilateral hip, back, flank pain for several days. EXAM: CT ABDOMEN AND PELVIS WITHOUT CONTRAST TECHNIQUE: Multidetector CT imaging of the abdomen and pelvis was performed following the standard protocol without IV contrast. COMPARISON:  08/06/2014 CT abdomen/pelvis. FINDINGS: Lower chest: Scattered small parenchymal bands in the dependent lung bases bilaterally, compatible with mild scarring or atelectasis. Cardiomegaly. Coronary atherosclerosis. Hepatobiliary: Normal liver size. No liver mass. Normal gallbladder with no radiopaque cholelithiasis. No biliary ductal dilatation. Pancreas: Normal, with no mass or duct dilation. Spleen: Normal size. No mass. Adrenals/Urinary Tract: Normal adrenals. Punctate nonobstructing lower left renal stone. No additional renal stones. No hydronephrosis. Slightly hyperdense 1.8 cm renal cortical lesion in the anterior interpolar left kidney with density 53 HU (series 3/image 25), stable since 08/06/2014 CT. No additional contour deforming renal lesions. Bladder obscured by streak artifact from left hip hardware  and nondistended. No gross bladder abnormality. Stomach/Bowel: Normal non-distended stomach. Apparent right hemicolectomy with ileocolic anastomosis in the midline abdomen. No small bowel dilatation or wall thickening. Moderate stool throughout the remnant large-bowel. No large bowel wall thickening, significant diverticulosis or acute pericolonic fat stranding. Vascular/Lymphatic: Atherosclerotic nonaneurysmal abdominal aorta. Infrarenal IVC filter is stable in position. No pathologically enlarged lymph nodes in the abdomen or pelvis. Reproductive: Status post hysterectomy, with no  abnormal findings at the vaginal cuff. No adnexal mass. Other: No pneumoperitoneum, ascites or focal fluid collection. Musculoskeletal: No aggressive appearing focal osseous lesions. Marked thoracolumbar spondylosis. Left total hip arthroplasty. IMPRESSION: 1. No acute abnormality. Apparent right hemicolectomy. No evidence of bowel obstruction or acute bowel inflammation. Moderate stool throughout the remnant large-bowel, suggesting constipation. 2. Punctate nonobstructing lower left renal stone. No hydronephrosis. No ureteral or bladder stones. 3. Slightly hyperdense 1.8 cm renal cortical lesion in the anterior interpolar left kidney. While technically indeterminate, lesion is stable since 2016 CT, probably a benign hemorrhagic/proteinaceous renal cyst. 4. Aortic Atherosclerosis (ICD10-I70.0). Electronically Signed   By: Ilona Sorrel M.D.   On: 04/07/2019 19:50   Dg Hips Bilat W Or Wo Pelvis 2 Views  Result Date: 04/07/2019 CLINICAL DATA:  Bilateral hip pain without known injury. EXAM: DG HIP (WITH OR WITHOUT PELVIS) 2V BILAT COMPARISON:  Radiographs of January 20, 2019. FINDINGS: Status post left total hip arthroplasty. No acute fracture or dislocation is noted. Right hip is unremarkable. IMPRESSION: No acute abnormality seen involving either hip. Status post left total hip arthroplasty. Electronically Signed   By: Marijo Conception M.D.   On: 04/07/2019 13:40    Assessment/Plan  #1 increased confusion change in mental status-will update lab work including a CBC with differential and a metabolic panel.  Also will attempt to obtain a urinalysis and culture-continue to monitor vital signs closely for any changes.  2.  Atrial fibrillation this appears to be rate controlled on Lopressor 37.5 mg twice daily-apparently she refused her medicines this morning-currently heart rate appears to be controlled but this will have to be watched especially if she continues to refuse her medications. In regards to  anticoagulation she is on Coumadin has been restarted 2 mg a day hopefully she will take Coumadin later in the day-update INR is pending for tomorrow there is been no evidence of increased bruising or bleeding despite recent elevation of her INR.  3.  History of CHF-her Lasix is currently been held because of her elevated sodium which was up to 147-she continues to be compensated it appears with no evidence of increased edema or chest congestion or shortness of breath-at this point continue to monitor her status in this regards-.  Addendum.  Later this evening I did reevaluate resident and she was actually cooperative with nursing-she was taking her medicines and was trying to eat some of her dinner-she appeared to be relatively more back at her baseline.  Again will update labs with results pending-and will still try to obtain a urinalysis and culture but her change in status appears to be improving which is encouraging.  [id number] .

## 2019-05-05 ENCOUNTER — Encounter: Payer: Self-pay | Admitting: Internal Medicine

## 2019-05-10 ENCOUNTER — Encounter: Payer: Self-pay | Admitting: Internal Medicine

## 2019-05-10 ENCOUNTER — Non-Acute Institutional Stay (SKILLED_NURSING_FACILITY): Payer: Medicare Other | Admitting: Internal Medicine

## 2019-05-10 DIAGNOSIS — I509 Heart failure, unspecified: Secondary | ICD-10-CM | POA: Diagnosis not present

## 2019-05-10 DIAGNOSIS — R4689 Other symptoms and signs involving appearance and behavior: Secondary | ICD-10-CM | POA: Diagnosis not present

## 2019-05-10 DIAGNOSIS — I4811 Longstanding persistent atrial fibrillation: Secondary | ICD-10-CM | POA: Diagnosis not present

## 2019-05-10 DIAGNOSIS — E870 Hyperosmolality and hypernatremia: Secondary | ICD-10-CM | POA: Diagnosis not present

## 2019-05-10 NOTE — Progress Notes (Signed)
This is an acute visit.  Level of care skilled.  Facility is [facility], hospital complaint acute visit follow-up altered mental status.  History of present illness.  Patient is a 85 year old female seen today for follow-up of altered mental status last week.  She is here for rehab after hospitalization for weakness and increased confusion with lower back pain-she was treated for a UTI.  She also had an acute kidney injury and this responded to IV fluids.  She also has a history of atrial fibrillation as well as DVT and pulmonary embolism is on chronic Coumadin INR was therapeutic at 2.6 on lab done November 2 previously had been supratherapeutic-.  She also recently received IV fluids because of an elevated sodium which came down from 147 down to 144.Marland Kitchen  She does have some history of CHF and she was on Lasix but this was held as well-- continues to be held-staff has been monitoring for any increased edema and there has not been any significant edema per nursing  Last Thursday she had some altered mental status where she was refusing her meds and agitated with staff which was unlike her.  Nursing staff did attempt to obtain a urinalysis and culture but because of agitation was not successful doing that-we also ordered labs but apparently these were not processed.  Nonetheless she appeared to be more at her baseline later in the day.  She does have some history of psychosis is on Seroquel and at times will say she sees little boys and girls in the room but nursing staff states this is not totally new.  Currently she is resting in her chair comfortably she is sleeping when I entered the room but was easily arousable and did follow simple verbal commands without difficulty and she was cooperative with the exam.  Vital signs have been stable.  Past Medical History:  Diagnosis Date  . Acute encephalopathy   . Acute renal failure superimposed on stage 3 chronic kidney disease ([place])    . Anemia   . Atrial fibrillation ([place])   . Colon cancer ([place]) 12/11  . Colon cancer ([place]) 2011  . Diarrhea   . DVT (deep venous thrombosis) ([place]) 11/06  . Elevated brain natriuretic peptide (BNP) level   . Frequent headaches   . Heart murmur   . Hyperkalemia   . Hypertension   . Pulmonary embolism ([place]) 11/06  . Seizures ([place])   . Stroke ([place]) 11/30/2014  . Urine retention 01/2019        Past Surgical History:  Procedure Laterality Date  . APPENDECTOMY  1952  . COLECTOMY  06/2010   partial, Dr. Donne Hazel complicated by LLE CVT; S/P IVC umbrella & anemia  . HIP FRACTURE SURGERY  2006   Trauma  . TOTAL ABDOMINAL HYSTERECTOMY W/ BILATERAL SALPINGOOPHORECTOMY  1973   For Fibroids  . TOTAL HIP ARTHROPLASTY  01/03/07   Left hip replacement.  Marland Kitchen VENA CAVA FILTER PLACEMENT  06/28/2010    No Known Allergies  MEDICATIONS       Medication Sig  . acetaminophen (TYLENOL) 500 MG tablet Take 1,000 mg by mouth every 8 (eight) hours as needed for headache (pain).   Marland Kitchen albuterol (PROVENTIL) (2.5 MG/3ML) 0.083% nebulizer solution Take 2.5 mg by nebulization every 6 (six) hours as needed for wheezing or shortness of breath.  Marland Kitchen aspirin 81 MG chewable tablet Chew 81 mg by mouth daily.  Marland Kitchen atorvastatin (LIPITOR) 20 MG tablet Take 1 tablet (20 mg total) by  mouth daily.  . calcium-vitamin D (OSCAL WITH D) 500-200 MG-UNIT tablet Take 1 tablet by mouth daily with breakfast.  . Ensure (ENSURE) Take 237 mLs by mouth 2 (two) times daily.  . isosorbide mononitrate (IMDUR) 30 MG 24 hr tablet Take 0.5 tablets (15 mg total) by mouth daily.  Marland Kitchen loratadine (CLARITIN) 10 MG tablet Take 1 tablet (10 mg total) by mouth daily as needed (seasonal allergies).  . metoprolol tartrate (LOPRESSOR) 25 MG tablet Take 1.5 tablets (37.5 mg total) by mouth 2 (two) times daily.  . mometasone-formoterol (DULERA) 100-5 MCG/ACT AERO Inhale 2 puffs into the lungs 2 (two) times daily.  . Multiple Vitamin  (MULTIVITAMIN WITH MINERALS) TABS tablet Take 1 tablet by mouth daily.  Marland Kitchen nystatin (MYCOSTATIN/NYSTOP) powder Apply topically daily. Apply under bilateral breasts QD  . polyethylene glycol (MIRALAX / GLYCOLAX) packet Take 17 g by mouth daily as needed.  Marland Kitchen PROAIR HFA 108 (90 Base) MCG/ACT inhaler Inhale 2 puffs into the lungs every 6 (six) hours as needed for wheezing or shortness of breath.  . promethazine (PHENERGAN) 12.5 MG tablet Take 1 tablet (12.5 mg total) by mouth 3 (three) times daily as needed for nausea or vomiting.  Marland Kitchen QUEtiapine (SEROQUEL) 25 MG tablet Take 2 tablets (50 mg total) by mouth at bedtime.  . tamsulosin (FLOMAX) 0.4 MG CAPS capsule Take 1 capsule (0.4 mg total) by mouth daily.  . verapamil (VERELAN PM) 120 MG 24 hr capsule Take 120 mg by mouth at bedtime.  Marland Kitchen warfarin (COUMADIN) 2 MG tablet Take 2 mg by mouth daily. FOR AFIB  . [DISCONTINUED] ALPRAZolam (XANAX) 0.25 MG tablet Take 1 tablet (0.25 mg total) by mouth 2 (two) times daily as needed for anxiety. x14 days  . [DISCONTINUED] furosemide (LASIX) 80 MG tablet Take 1 tablet (80 mg total) by mouth daily.  . [DISCONTINUED] potassium chloride SA (KLOR-CON) 20 MEQ tablet Take 20 mEq by mouth 2 (two) times daily. Take while on IV fluids  . [DISCONTINUED] sodium chloride 0.45 % solution Inject 75 mLs into the vein continuous. x4 liters      Review of systems.  In general she is not complaining of any fever or chills or discomfort.  Skin does not complain of rashes or itching or increased bleeding or bruising.  Head ears eyes nose mouth and throat does not complain of any visual changes or sore throat.  Respiratory does not complain of a cough or shortness of breath.  Cardiac does not complain of chest pain or increased edema-nursing staff has been monitoring for any increased edema.  GI is not complaining of abdominal discomfort nausea vomiting diarrhea constipation.  GU does not complain of dysuria.   Musculoskeletal she is not really complaining of any joint pain or back pain this afternoon.  Neurologic is not complaining of being dizzy or syncopal or having a headache or numbness.  Psych nursing has not really reported any acute behaviors recently at times she will have some hallucinations-and continues on Seroquel.  Physical exam.  Temperature is 97.1 pulse is 76 respirations 18 blood pressure 120/69.  General this is a pleasant elderly female in no distress sitting comfortably in her chair she is sleeping but easily arousable.  Her skin is warm and dry she does have some old bruising of her lower arms bilaterally this does not appear to be acute or new.  Eyes visual acuity appears grossly intact sclera and conjunctive are clear.  Oropharynx is clear mucous membranes appear fairly moist tongue  is midline.  Chest is clear to auscultation with slightly shallow air entry there is no labored breathing.  Heart is irregular irregular rate and rhythm she does not really have significant edema-.  Abdomen is soft nontender with active bowel sounds.  Musculoskeletal appears able to move all her extremities x4 this is limited secondary to patient being up in her recliner she does have some baseline increase in size of her right leg compared to her left but this is not new-pedal pulses are intact bilaterally.  Neurologic is grossly intact she is alert could not really appreciate lateralizing findings her speech is clear.   Psych she is pleasant and appropriate-cooperative with exam.  Labs.  April 28, 2019.  Sodium 144 potassium 4 BUN 18.7 creatinine 0.91.  April 24, 2019.  WBC 4.3 hemoglobin 13.3 platelets 85,000   Recent Labs (within last 365 days)            Recent Labs    01/22/19 0405 01/24/19 0432  04/08/19 0021  04/11/19 0556 04/12/19 0536 04/14/19 0532  NA 142 144   < > 144   < > 148* 147* 145  K 3.2* 4.7   < > 3.5   < > 4.3 4.1 3.8  CL 105 112*   < >  107   < > 111 108 109  CO2 27 24   < > 27   < > 27 25 24   GLUCOSE 92 107*   < > 114*   < > 87 93 103*  BUN 24* 25*   < > 41*   < > 31* 29* 26*  CREATININE 1.41* 1.33*   < > 1.96*   < > 1.28* 1.26* 1.13*  CALCIUM 8.5* 9.4   < > 9.2   < > 9.0 9.3 9.4  MG 2.1 2.0  --  2.2  --   --   --   --   PHOS 3.4  --   --  4.0  --   --   --   --    < > = values in this interval not displayed.     Recent Labs (within last 365 days)  Recent Labs    01/20/19 0918 03/07/19 1454 04/07/19 1530  AST 25 17 31   ALT 15 10 15   ALKPHOS 41 43 56  BILITOT 0.7 0.8 1.4*  PROT 5.9* 6.6 6.9  ALBUMIN 3.8 4.1 4.7     Recent Labs (within last 365 days)          Recent Labs    01/20/19 0918  03/07/19 1454 04/07/19 1530 04/08/19 0021 04/12/19 0536  WBC 4.6   < > 6.3 8.2 6.9 5.3  NEUTROABS 3.1  --  4.6 6.5  --   --   HGB 12.0   < > 13.3 13.3 12.7 12.9  HCT 38.8   < > 40.5 41.2 40.0 42.0  MCV 97.5   < > 93.6 95.2 97.6 100.0  PLT 76*   < > 89.0* 75* 76* 70*   < > = values in this interval not displayed.     Recent Labs       Lab Results  Component Value Date   TSH 3.58 08/22/2018     Recent Labs       Lab Results  Component Value Date   HGBA1C 5.6 11/13/2013     Recent Labs    Assessment and plan.  1.  Altered mental status she appears to be back more at  her baseline at times does have some psychosis hallucinations-she is on Seroquel nightly-we will write an order to have psychiatric nurse practitioner take a look at her but clinically appears to be relatively baseline. Also will rewrite to have the CBC metabolic panel redrawn so we can get some updated values.  2.  History of hypernatremia she did respond to IV fluids but most recent sodium 144 again will update this her Lasix is continuing to be held.  3.  History of edema-CHF-she does not really appear to have significant edema and her Lasix is currently on hold clinically she appears to be compensated but this will have to be  continued to be monitored.  Also will await updated BMP.  4.  Atrial fibrillation this appears rate controlled on Lopressor 37.5 mg twice a day with verapamil 20 mg a day she is also on Coumadin INR on November 2 was 2.6 which is therapeutic--- update INR is pending--- she is currently on 2 mg a day .  [id number]

## 2019-05-11 ENCOUNTER — Non-Acute Institutional Stay (SKILLED_NURSING_FACILITY): Payer: Medicare Other | Admitting: Internal Medicine

## 2019-05-11 DIAGNOSIS — D696 Thrombocytopenia, unspecified: Secondary | ICD-10-CM | POA: Diagnosis not present

## 2019-05-11 DIAGNOSIS — N289 Disorder of kidney and ureter, unspecified: Secondary | ICD-10-CM

## 2019-05-11 DIAGNOSIS — I4891 Unspecified atrial fibrillation: Secondary | ICD-10-CM | POA: Diagnosis not present

## 2019-05-11 DIAGNOSIS — E870 Hyperosmolality and hypernatremia: Secondary | ICD-10-CM | POA: Diagnosis not present

## 2019-05-11 DIAGNOSIS — I509 Heart failure, unspecified: Secondary | ICD-10-CM

## 2019-05-11 NOTE — Progress Notes (Signed)
This is an acute visit.  Level of care skilled.  [facility]  Chief complaint-acute visit secondary to renal insufficiency-hypernatremia.  History of present illness.  Patient is a pleasant 85 year old female seen today for labs which showed an elevated creatinine as well as sodium-sodium is 148 on today's lab and creatinine is up to 1.63-previously had been more around 1.  She was seen recently for altered mental status which appears to have improved.  She also does have some history of hypernatremia and has received IV fluids in the past with improvement.  Nursing has been trying to get her to take more fluids but this has been somewhat challenging at times especially with her recent changes in behavior which again appeared to have improved   She is here for rehab after hospitalization for weakness and increased confusion with lower back pain and was treated for a UTI.  At that time she also had acute kidney injury and received IV fluids.  She does have a history of CHF had been on Lasix but this has been discontinued at least for now because of stability and her elevated sodium-she continues to be stable in this regards without evidence of increased shortness of breath chest congestion or edema.  Currently she is sitting in her chair comfortably does not really have any acute complaints-vital signs appear to be stable.  I do note lab today shows her platelet count is 60,000 she does have a history of thrombocytopenia with platelet count under 100,000 last time it was in the 80,000 range this will have to be updated next week as well she does not show evidence of any increased bruising or bleeding-she is on Coumadin with a history of pulmonary embolism in the past INR is now therapeutic. Hemoglobin shows stability at 14.8 this may be somewhat increased because of her elevated creatinine and renal issues  Past Medical History:  Diagnosis Date  . Acute encephalopathy    . Acute renal failure superimposed on stage 3 chronic kidney disease ([place])   . Anemia   . Atrial fibrillation ([place])   . Colon cancer ([place]) 12/11  . Colon cancer ([place]) 2011  . Diarrhea   . DVT (deep venous thrombosis) ([place]) 11/06  . Elevated brain natriuretic peptide (BNP) level   . Frequent headaches   . Heart murmur   . Hyperkalemia   . Hypertension   . Pulmonary embolism ([place]) 11/06  . Seizures ([place])   . Stroke ([place]) 11/30/2014  . Urine retention 01/2019        Past Surgical History:  Procedure Laterality Date  . APPENDECTOMY  1952  . COLECTOMY  06/2010   partial, Dr. Donne Hazel complicated by LLE CVT; S/P IVC umbrella & anemia  . HIP FRACTURE SURGERY  2006   Trauma  . TOTAL ABDOMINAL HYSTERECTOMY W/ BILATERAL SALPINGOOPHORECTOMY  1973   For Fibroids  . TOTAL HIP ARTHROPLASTY  01/03/07   Left hip replacement.  Marland Kitchen VENA CAVA FILTER PLACEMENT  06/28/2010    No Known Allergies  MEDICATIONS       Medication Sig  . acetaminophen (TYLENOL) 500 MG tablet Take 1,000 mg by mouth every 8 (eight) hours as needed for headache (pain).   Marland Kitchen albuterol (PROVENTIL) (2.5 MG/3ML) 0.083% nebulizer solution Take 2.5 mg by nebulization every 6 (six) hours as needed for wheezing or shortness of breath.  Marland Kitchen aspirin 81 MG chewable tablet Chew 81 mg by mouth daily.  Marland Kitchen atorvastatin (LIPITOR) 20 MG tablet Take 1  tablet (20 mg total) by mouth daily.  . calcium-vitamin D (OSCAL WITH D) 500-200 MG-UNIT tablet Take 1 tablet by mouth daily with breakfast.  . Ensure (ENSURE) Take 237 mLs by mouth 2 (two) times daily.  . isosorbide mononitrate (IMDUR) 30 MG 24 hr tablet Take 0.5 tablets (15 mg total) by mouth daily.  Marland Kitchen loratadine (CLARITIN) 10 MG tablet Take 1 tablet (10 mg total) by mouth daily as needed (seasonal allergies).  . metoprolol tartrate (LOPRESSOR) 25 MG tablet Take 1.5 tablets (37.5 mg total) by mouth 2 (two) times daily.  . mometasone-formoterol (DULERA) 100-5  MCG/ACT AERO Inhale 2 puffs into the lungs 2 (two) times daily.  . Multiple Vitamin (MULTIVITAMIN WITH MINERALS) TABS tablet Take 1 tablet by mouth daily.  Marland Kitchen nystatin (MYCOSTATIN/NYSTOP) powder Apply topically daily. Apply under bilateral breasts QD  . polyethylene glycol (MIRALAX / GLYCOLAX) packet Take 17 g by mouth daily as needed.  Marland Kitchen PROAIR HFA 108 (90 Base) MCG/ACT inhaler Inhale 2 puffs into the lungs every 6 (six) hours as needed for wheezing or shortness of breath.  . promethazine (PHENERGAN) 12.5 MG tablet Take 1 tablet (12.5 mg total) by mouth 3 (three) times daily as needed for nausea or vomiting.  Marland Kitchen QUEtiapine (SEROQUEL) 25 MG tablet Take 2 tablets (50 mg total) by mouth at bedtime.  . tamsulosin (FLOMAX) 0.4 MG CAPS capsule Take 1 capsule (0.4 mg total) by mouth daily.  . verapamil (VERELAN PM) 120 MG 24 hr capsule Take 120 mg by mouth at bedtime.  Marland Kitchen warfarin (COUMADIN) 2 MG tablet Take 2 mg by mouth daily. FOR AFIB  . [DISCONTINUED] ALPRAZolam (XANAX) 0.25 MG tablet Take 1 tablet (0.25 mg total) by mouth 2 (two) times daily as needed for anxiety. x14 days  . [DISCONTINUED] furosemide (LASIX) 80 MG tablet Take 1 tablet (80 mg total) by mouth daily.  . [DISCONTINUED] potassium chloride SA (KLOR-CON) 20 MEQ tablet Take 20 mEq by mouth 2 (two) times daily. Take while on IV fluids  . [DISCONTINUED] sodium chloride 0.45 % solution Inject 75 mLs into the vein continuous. x4 liters      Review of systems.  In general she is not complaining of fever chills or pain at this time.  Skin does not complain of itching rashes or diaphoresis.  Head ears eyes nose mouth and throat does not complain of visual changes or sore throat.  Respiratory denies cough or shortness of breath.  Cardiac does not appear to have significant edema does not complain of chest pain.  GI does not complain of any stomachache nausea vomiting diarrhea or constipation.  GU did complete treatment for UTI-does  not really complain of dysuria--recently when she had behavioral changes staff attempted a urinalysis and culture but patient was resistant to having this done-again her behaviors have improved and she is now more at her baseline.  Musculoskeletal is not complaining of joint pain currently.  Neurologic does have weakness does not complain of dizziness headache or syncope.  Psych is not complaining of being depressed or anxious her behaviors are now or at baseline she is pleasant and cooperative with exam   Physical exam.  Temperature is 97.9 pulse regular is 60 respirations 18 blood pressure 131/72.  In general this is a pleasant elderly female in no distress she is sitting comfortably in her chair.  Her skin is warm and dry she does have some chronic bruising more noticeable of her lower arms bilaterally.  Eyes visual acuity appears to be  intact sclera and conjunctive are clear.  Oropharynx is clear mucous membranes moist.  Chest is clear to auscultation there is no labored breathing air entry is slightly shallow.  Heart is irregular irregular rate and rhythm at 60 she does not have significant edema.  Her abdomen soft nontender with positive bowel sounds.  Musculoskeletal moves all extremities x4 at baseline-I do note left middle finger she has some slight swelling per staff this is chronic and comes and goes depending on if she sleeps with her head resting against her left hand   She does have weakness lower extremities which is baseline.  Neurologic is grossly intact she is alert she is pleasant and appropriate cannot really appreciate lateralizing findings her speech continues to be clear.  Psych again she is pleasant appropriate appears to be at her baseline mental status follow simple verbal commands without difficulty.  Labs.  May 11, 2019.  WBC 5.0 hemoglobin 14.8 platelets 60,000.  Sodium 148 potassium 3.7 BUN 30.9 creatinine 1.63.  Previous creatinine  0.91-1.25.  Assessment and plan.  1.-History of hypernatremia and renal insufficiency-she appears to be getting somewhat dry-will reinitiate previous order for half-normal saline at 75 cc an hour for 4 L.  Apparently she has been somewhat resistant to aggressive oral fluid resuscitation as previously ordered so hopefully will be cooperative with the IV.  Will update a basic metabolic panel on Monday, November 9 continue to monitor clinically.  Also will give her 20 mEq of potassium on days she is receiving the IV fluids.  2.  History of CHF again this appears to be compensated despite being off the Lasix-this is somewhat difficult situation with her renal issues and hypernatremia but at this point has been stable off the Lasix.  3.  History of atrial fibrillation this appears to be rate controlled she is on Lopressor 37.5 mg twice a day and verapamil 120 mg a day she is on Coumadin at 1 point INR was supratherapeutic but this is normalized now at 2.6 and update INR is pending   #4 history of thrombocytopenia this appears to be fairly chronic but is down somewhat from recent level at 60,000 will have this updated early next week as well-she does not show evidence of any increased bleeding or increased bruising from baseline  [id number]

## 2019-05-12 ENCOUNTER — Encounter: Payer: Self-pay | Admitting: Internal Medicine

## 2019-05-15 ENCOUNTER — Encounter: Payer: Self-pay | Admitting: Internal Medicine

## 2019-05-15 NOTE — Progress Notes (Signed)
Location:    [facility] Room Number: 100/P Place of Service:  SNF (31) Provider: Ericka Pontiff, MD  Patient Care Team: Colon Branch, MD as PCP - General (Internal Medicine)  Extended Emergency Contact Information Primary Emergency Contact: [name],[name] Address: [address]          [place], [place] [address] [name] of [place] Phone: [phone] Mobile Phone: [phone] Relation: Daughter  Code Status:  Full Code Goals of care: Advanced Directive information Advanced Directives 05/15/2019  Does Patient Have a Medical Advance Directive? Yes  Type of Advance Directive (No Data)  Does patient want to make changes to medical advance directive? No - Patient declined  Copy of [place] in Chart? -  Would patient like information on creating a medical advance directive? -     Chief Complaint  Patient presents with  . Medical Management of Chronic Issues    Routine visit of medical management    HPI:  Pt is a 85 y.o. female seen today for medical management of chronic diseases.     Past Medical History:  Diagnosis Date  . Acute encephalopathy   . Acute renal failure superimposed on stage 3 chronic kidney disease ([place])   . Anemia   . Atrial fibrillation ([place])   . Colon cancer ([place]) 12/11  . Colon cancer ([place]) 2011  . Diarrhea   . DVT (deep venous thrombosis) ([place]) 11/06  . Elevated brain natriuretic peptide (BNP) level   . Frequent headaches   . Heart murmur   . Hyperkalemia   . Hypertension   . Pulmonary embolism ([place]) 11/06  . Seizures ([place])   . Stroke ([place]) 11/30/2014  . Urine retention 01/2019   Past Surgical History:  Procedure Laterality Date  . APPENDECTOMY  1952  . COLECTOMY  06/2010   partial, Dr. Donne Hazel complicated by LLE CVT; S/P IVC umbrella & anemia  . HIP FRACTURE SURGERY  2006   Trauma  . TOTAL ABDOMINAL HYSTERECTOMY W/ BILATERAL SALPINGOOPHORECTOMY  1973   For Fibroids  . TOTAL HIP ARTHROPLASTY  01/03/07   Left hip replacement.  Marland Kitchen VENA CAVA FILTER PLACEMENT  06/28/2010    No Known Allergies  Allergies as of 05/15/2019   No Known Allergies     Medication List       Accurate as of May 15, 2019  9:17 AM. If you have any questions, ask your nurse or doctor.        acetaminophen 500 MG tablet Commonly known as: TYLENOL Take 1,000 mg by mouth every 8 (eight) hours as needed for headache (pain).   albuterol (2.5 MG/3ML) 0.083% nebulizer solution Commonly known as: PROVENTIL Take 2.5 mg by nebulization every 6 (six) hours as needed for wheezing or shortness of breath. What changed: Another medication with the same name was removed. Continue taking this medication, and follow the directions you see here. Changed by: Granville Lewis, PA-C   albuterol 108 (90 Base) MCG/ACT inhaler Commonly known as: VENTOLIN HFA Inhale 2 puffs into the lungs every 6 (six) hours as needed for wheezing or shortness of breath. What changed: Another medication with the same name was removed. Continue taking this medication, and follow the directions you see here. Changed by: Granville Lewis, PA-C   aspirin 81 MG chewable tablet Chew 81 mg by mouth daily.   atorvastatin 20 MG tablet Commonly known as: LIPITOR Take 1 tablet (20 mg total) by mouth daily.  calcium-vitamin D 500-200 MG-UNIT Tabs tablet Commonly known as: OSCAL WITH D Take 1 tablet by mouth daily.   Dulera 100-5 MCG/ACT Aero Generic drug: mometasone-formoterol Inhale 2 puffs into the lungs 2 (two) times daily.   Ensure Take 237 mLs by mouth 2 (two) times daily.   isosorbide mononitrate 30 MG 24 hr tablet Commonly known as: IMDUR Take 0.5 tablets (15 mg total) by mouth daily.   loratadine 10 MG tablet Commonly known as: CLARITIN Take 1 tablet (10 mg total) by mouth daily as needed (seasonal allergies).   metoprolol tartrate 25 MG tablet Commonly known as: LOPRESSOR Take 1.5 tablets  (37.5 mg total) by mouth 2 (two) times daily.   multivitamin with minerals Tabs tablet Take 1 tablet by mouth daily.   nystatin powder Commonly known as: MYCOSTATIN/NYSTOP Apply topically daily. Apply under bilateral breasts QD   polyethylene glycol 17 g packet Commonly known as: MIRALAX / GLYCOLAX Take 17 g by mouth daily as needed.   promethazine 12.5 MG tablet Commonly known as: PHENERGAN Take 1 tablet (12.5 mg total) by mouth 3 (three) times daily as needed for nausea or vomiting.   QUEtiapine 25 MG tablet Commonly known as: SEROQUEL Take 2 tablets (50 mg total) by mouth at bedtime.   tamsulosin 0.4 MG Caps capsule Commonly known as: FLOMAX Take 1 capsule (0.4 mg total) by mouth daily.   verapamil 120 MG 24 hr capsule Commonly known as: VERELAN PM Take 120 mg by mouth at bedtime.   warfarin 2 MG tablet Commonly known as: COUMADIN Take as directed by the anticoagulation clinic. If you are unsure how to take this medication, talk to your nurse or doctor. Original instructions: Take 2 mg by mouth daily. FOR AFIB       Review of Systems  Immunization History  Administered Date(s) Administered  . Fluad Quad(high Dose 65+) 03/07/2019  . Influenza Split 03/30/2012  . Influenza Whole 04/30/2006, 04/20/2008, 04/15/2009  . Influenza, High Dose Seasonal PF 04/20/2013, 03/27/2015, 03/31/2016, 04/22/2017, 04/20/2018  . Influenza,inj,Quad PF,6+ Mos 05/23/2014  . Pneumococcal Conjugate-13 04/24/2015  . Pneumococcal Polysaccharide-23 07/06/2009  . Td 10/30/2016   Pertinent  Health Maintenance Due  Topic Date Due  . DEXA SCAN  02/27/2020 (Originally 11/26/1993)  . INFLUENZA VACCINE  Completed  . PNA vac Low Risk Adult  Completed   Fall Risk  07/22/2018 01/18/2018 10/30/2016 08/14/2015 04/24/2015  Falls in the past year? 1 No No No No  Number falls in past yr: 1 - - - -  Injury with Fall? 0 - - - -   Functional Status Survey:    Vitals:   05/15/19 0855  BP: 122/65   Resp: 19  Temp: (!) 97.5 F (36.4 C)  TempSrc: Oral  SpO2: 100%  Weight: 171 lb 3.2 oz (77.7 kg)  Height: 5\' 3"  (1.6 m)   Body mass index is 30.33 kg/m. Physical Exam  Labs reviewed: Recent Labs    01/22/19 0405 01/24/19 0432  04/08/19 0021  04/11/19 0556 04/12/19 0536 04/14/19 0532  NA 142 144   < > 144   < > 148* 147* 145  K 3.2* 4.7   < > 3.5   < > 4.3 4.1 3.8  CL 105 112*   < > 107   < > 111 108 109  CO2 27 24   < > 27   < > 27 25 24   GLUCOSE 92 107*   < > 114*   < > 87 93 103*  BUN 24* 25*   < > 41*   < > 31* 29* 26*  CREATININE 1.41* 1.33*   < > 1.96*   < > 1.28* 1.26* 1.13*  CALCIUM 8.5* 9.4   < > 9.2   < > 9.0 9.3 9.4  MG 2.1 2.0  --  2.2  --   --   --   --   PHOS 3.4  --   --  4.0  --   --   --   --    < > = values in this interval not displayed.   Recent Labs    01/20/19 0918 03/07/19 1454 04/07/19 1530  AST 25 17 31   ALT 15 10 15   ALKPHOS 41 43 56  BILITOT 0.7 0.8 1.4*  PROT 5.9* 6.6 6.9  ALBUMIN 3.8 4.1 4.7   Recent Labs    01/20/19 0918  03/07/19 1454 04/07/19 1530 04/08/19 0021 04/12/19 0536  WBC 4.6   < > 6.3 8.2 6.9 5.3  NEUTROABS 3.1  --  4.6 6.5  --   --   HGB 12.0   < > 13.3 13.3 12.7 12.9  HCT 38.8   < > 40.5 41.2 40.0 42.0  MCV 97.5   < > 93.6 95.2 97.6 100.0  PLT 76*   < > 89.0* 75* 76* 70*   < > = values in this interval not displayed.   Lab Results  Component Value Date   TSH 3.58 08/22/2018   Lab Results  Component Value Date   HGBA1C 5.6 11/13/2013   Lab Results  Component Value Date   CHOL 97 08/16/2017   HDL 37.70 (L) 08/16/2017   LDLCALC 48 08/16/2017   TRIG 56.0 08/16/2017   CHOLHDL 3 08/16/2017    Significant Diagnostic Results in last 30 days:  Dg Chest Port 1 View  Result Date: 04/17/2019 CLINICAL DATA:  Shortness of breath. EXAM: PORTABLE CHEST 1 VIEW COMPARISON:  April 12, 2019 FINDINGS: Mild developing opacity in the right base. The cardiomediastinal silhouette with cardiomegaly is identified and  stable. No pneumothorax. No other acute abnormalities. IMPRESSION: Developing opacity in the right base could represent pneumonia. Recommend clinical correlation and short-term follow-up imaging to ensure resolution. Electronically Signed   By: Dorise Bullion III M.D   On: 04/17/2019 13:35    Assessment/Plan There are no diagnoses linked to this encounter.   Family/ staff Communication:   Labs/tests ordered:      This encounter was created in error - please disregard.

## 2019-05-16 ENCOUNTER — Non-Acute Institutional Stay (SKILLED_NURSING_FACILITY): Payer: Medicare Other | Admitting: Internal Medicine

## 2019-05-16 ENCOUNTER — Encounter: Payer: Self-pay | Admitting: Internal Medicine

## 2019-05-16 DIAGNOSIS — D696 Thrombocytopenia, unspecified: Secondary | ICD-10-CM

## 2019-05-16 DIAGNOSIS — I482 Chronic atrial fibrillation, unspecified: Secondary | ICD-10-CM

## 2019-05-16 DIAGNOSIS — Z8679 Personal history of other diseases of the circulatory system: Secondary | ICD-10-CM

## 2019-05-16 DIAGNOSIS — E870 Hyperosmolality and hypernatremia: Secondary | ICD-10-CM | POA: Diagnosis not present

## 2019-05-16 DIAGNOSIS — I2510 Atherosclerotic heart disease of native coronary artery without angina pectoris: Secondary | ICD-10-CM

## 2019-05-16 DIAGNOSIS — N183 Chronic kidney disease, stage 3 unspecified: Secondary | ICD-10-CM

## 2019-05-16 NOTE — Progress Notes (Signed)
Location:  [facility] Room Number: 100-P Place of Service:  SNF [phone]) Provider:  Granville Lewis, PA  Colon Branch, MD  Patient Care Team: Colon Branch, MD as PCP - General (Internal Medicine)  Extended Emergency Contact Information Primary Emergency Contact: [name],[name] Address: [address]          [place], [place] [address] [name] of [place] Phone: [phone] Mobile Phone: [phone] Relation: Daughter  Code Status:  Full Code Goals of care: Advanced Directive information Advanced Directives 05/15/2019  Does Patient Have a Medical Advance Directive? Yes  Type of Advance Directive (No Data)  Does patient want to make changes to medical advance directive? No - Patient declined  Copy of [place] in Chart? -  Would patient like information on creating a medical advance directive? -     Chief Complaint  Patient presents with  . Medical Management of Chronic Issues    Routine [facility] visit  Routine visit for medical management of chronic medical conditions including history of atrial fibrillation-pulmonary embolism on chronic Coumadin-hypertension hyperlipidemia CVA chronic kidney disease stage III as well as asthma and colon cancer as well as history of weakness.   History of present illness.  Patient is a pleasant 85 year old female who is seen today for follow-up of routine medical issues as noted above  She was admitted to the hospital in early October for increased weakness and was treated for Klebsiella UTI.  X-ray of the lumbar spine show degenerative disc disease-she also had acute kidney injury which responded to IV fluids.  She is here for PT and OT.  She has had a few issues since she has been here including some mild hypernatremia which responded to IV fluids.  She also at one point had an elevated INR but Coumadin was held and this did normalize INR has been therapeutic recently and  update INR is pending.  Recently she was noted to have her creatinine elevated somewhat and she was given IV fluids with some mild improvement however creatinine still is somewhat elevated at 1.57 with a BUN of 43.1.  This is somewhat complicated however with her history of CHF so we have been trying to be cautious with her fluid resuscitation-it was noted her CO2 level also was down somewhat at 16.  Clinically she appears to be doing better several days ago she had some increased confusion but this appears to have largely resolved.  She also has received a short course of colchicine for suspected gout of her left middle finger.  Currently she is sitting in her chair comfortably she is pleasant and appropriate vital signs appear to be stable.           Past Medical History:  Diagnosis Date  . Acute encephalopathy   . Acute renal failure superimposed on stage 3 chronic kidney disease ([place])   . Anemia   . Atrial fibrillation ([place])   . Colon cancer ([place]) 12/11  . Colon cancer ([place]) 2011  . Diarrhea   . DVT (deep venous thrombosis) ([place]) 11/06  . Elevated brain natriuretic peptide (BNP) level   . Frequent headaches   . Heart murmur   . Hyperkalemia   . Hypertension   . Pulmonary embolism ([place]) 11/06  . Seizures ([place])   . Stroke ([place]) 11/30/2014  . Urine retention 01/2019   Past Surgical History:  Procedure Laterality Date  . APPENDECTOMY  1952  . COLECTOMY  06/2010  partial, Dr. Donne Hazel complicated by LLE CVT; S/P IVC umbrella & anemia  . HIP FRACTURE SURGERY  2006   Trauma  . TOTAL ABDOMINAL HYSTERECTOMY W/ BILATERAL SALPINGOOPHORECTOMY  1973   For Fibroids  . TOTAL HIP ARTHROPLASTY  01/03/07   Left hip replacement.  Marland Kitchen VENA CAVA FILTER PLACEMENT  06/28/2010    No Known Allergies  Outpatient Encounter Medications as of 05/16/2019  Medication Sig  . acetaminophen (TYLENOL) 500 MG tablet Take 1,000 mg by mouth every 8 (eight) hours as needed for headache (pain).    Marland Kitchen albuterol (PROVENTIL) (2.5 MG/3ML) 0.083% nebulizer solution Take 2.5 mg by nebulization every 6 (six) hours as needed for wheezing or shortness of breath.  Marland Kitchen albuterol (VENTOLIN HFA) 108 (90 Base) MCG/ACT inhaler Inhale 2 puffs into the lungs every 6 (six) hours as needed for wheezing or shortness of breath.  Marland Kitchen aspirin 81 MG chewable tablet Chew 81 mg by mouth daily.  Marland Kitchen atorvastatin (LIPITOR) 20 MG tablet Take 1 tablet (20 mg total) by mouth daily.  . calcium-vitamin D (OSCAL WITH D) 500-200 MG-UNIT TABS tablet Take 1 tablet by mouth daily.  . Ensure (ENSURE) Take 237 mLs by mouth 2 (two) times daily.  . isosorbide mononitrate (IMDUR) 30 MG 24 hr tablet Take 0.5 tablets (15 mg total) by mouth daily.  Marland Kitchen loratadine (CLARITIN) 10 MG tablet Take 1 tablet (10 mg total) by mouth daily as needed (seasonal allergies).  . metoprolol tartrate (LOPRESSOR) 25 MG tablet Take 1.5 tablets (37.5 mg total) by mouth 2 (two) times daily.  . mometasone-formoterol (DULERA) 100-5 MCG/ACT AERO Inhale 2 puffs into the lungs 2 (two) times daily.  . Multiple Vitamin (MULTIVITAMIN WITH MINERALS) TABS tablet Take 1 tablet by mouth daily.  Marland Kitchen nystatin (MYCOSTATIN/NYSTOP) powder Apply topically daily. Apply under bilateral breasts QD  . polyethylene glycol (MIRALAX / GLYCOLAX) packet Take 17 g by mouth daily as needed.  . promethazine (PHENERGAN) 12.5 MG tablet Take 1 tablet (12.5 mg total) by mouth 3 (three) times daily as needed for nausea or vomiting.  Marland Kitchen QUEtiapine (SEROQUEL) 25 MG tablet Take 2 tablets (50 mg total) by mouth at bedtime.  . tamsulosin (FLOMAX) 0.4 MG CAPS capsule Take 1 capsule (0.4 mg total) by mouth daily.  . verapamil (VERELAN PM) 120 MG 24 hr capsule Take 120 mg by mouth at bedtime.  Marland Kitchen warfarin (COUMADIN) 2 MG tablet Take 2 mg by mouth daily. FOR AFIB   No facility-administered encounter medications on file as of 05/16/2019.     Review of Systems   In general she is not complaining of any fever  or chills.  Skin does not complain of rashes or itching has had some swelling of her left middle finger with possible gout.  Head ears eyes nose mouth and throat is not complain of visual changes or sore throat.  Respiratory is not complaining of shortness of breath or cough.  Cardiac does not complain of chest pain has some very mild edema. \  GI is not complaining of abdominal pain nausea vomiting diarrhea constipation.  GU is not complaining of dysuria.  Musculoskeletal still has weakness but has gained some strength during her stay here does not really complain of joint pain.  Neurologic positive for weakness does not complain of dizziness headache or syncope.  And psych is not complaining of being depressed or anxious again earlier than her stated she did have some agitation but this appears to have largely resolved.    Immunization History  Administered Date(s) Administered  . Fluad Quad(high Dose 65+) 03/07/2019  . Influenza Split 03/30/2012  . Influenza Whole 04/30/2006, 04/20/2008, 04/15/2009  . Influenza, High Dose Seasonal PF 04/20/2013, 03/27/2015, 03/31/2016, 04/22/2017, 04/20/2018  . Influenza,inj,Quad PF,6+ Mos 05/23/2014  . Pneumococcal Conjugate-13 04/24/2015  . Pneumococcal Polysaccharide-23 07/06/2009  . Td 10/30/2016   Pertinent  Health Maintenance Due  Topic Date Due  . DEXA SCAN  02/27/2020 (Originally 11/26/1993)  . INFLUENZA VACCINE  Completed  . PNA vac Low Risk Adult  Completed   Fall Risk  07/22/2018 01/18/2018 10/30/2016 08/14/2015 04/24/2015  Falls in the past year? 1 No No No No  Number falls in past yr: 1 - - - -  Injury with Fall? 0 - - - -   Functional Status Survey:    Vitals:   05/16/19 0831  BP: 121/72  Resp: 19  Temp: 97.6 F (36.4 C)  TempSrc: Oral  SpO2: 100%  Weight: 167 lb 14.4 oz (76.2 kg)  Height: 5\' 3"  (1.6 m)   Body mass index is 29.74 kg/m. Physical Exam In general this is a pleasant elderly female in no distress  sitting comfortably in her chair.  Her skin is warm and dry she does have some chronic bruising most noticeable of her lower arms bilaterally.  Eyes visual acuity appears to be intact sclera and conjunctive are clear.  Oropharynx is clear mucous membranes moist.  Chest is clear to auscultation with shallow air entry there is no labored breathing.  Heart is irregular irregular rate and rhythm she has some very mild lower extremity edema a bit more on her right leg which is chronic.  Abdomen is soft nontender with positive bowel sounds.  Musculoskeletal is able to move all extremities x4 at baseline she actually is able to stand without assistance but continues to be quite weak.  Left third finger has some mild erythema less tenderness and less edema noted-per staff this is improving  Neurologic as noted above cannot really appreciate lateralizing findings her speech is clear.  Psych she is pleasant and appropriate appears to be at her baseline does have some mild cognitive deficits.   Labs reviewed:   May 15, 2019.  WBC 4.3 hemoglobin 12.9 platelets 56,000.  Sodium 141 potassium 4.8 BUN 43.1 creatinine 1.57.  CO2 level 16.    May 11, 2019.  WBC 5.0 hemoglobin 14.8 platelets 60,000.  Sodium 148 potassium 3.7 BUN 30.9 creatinine 1.63.   Recent Labs    01/22/19 0405 01/24/19 0432  04/08/19 0021  04/11/19 0556 04/12/19 0536 04/14/19 0532  NA 142 144   < > 144   < > 148* 147* 145  K 3.2* 4.7   < > 3.5   < > 4.3 4.1 3.8  CL 105 112*   < > 107   < > 111 108 109  CO2 27 24   < > 27   < > 27 25 24   GLUCOSE 92 107*   < > 114*   < > 87 93 103*  BUN 24* 25*   < > 41*   < > 31* 29* 26*  CREATININE 1.41* 1.33*   < > 1.96*   < > 1.28* 1.26* 1.13*  CALCIUM 8.5* 9.4   < > 9.2   < > 9.0 9.3 9.4  MG 2.1 2.0  --  2.2  --   --   --   --   PHOS 3.4  --   --  4.0  --   --   --   --    < > =  values in this interval not displayed.   Recent Labs    01/20/19 0918 03/07/19  1454 04/07/19 1530  AST 25 17 31   ALT 15 10 15   ALKPHOS 41 43 56  BILITOT 0.7 0.8 1.4*  PROT 5.9* 6.6 6.9  ALBUMIN 3.8 4.1 4.7   Recent Labs    01/20/19 0918  03/07/19 1454 04/07/19 1530 04/08/19 0021 04/12/19 0536  WBC 4.6   < > 6.3 8.2 6.9 5.3  NEUTROABS 3.1  --  4.6 6.5  --   --   HGB 12.0   < > 13.3 13.3 12.7 12.9  HCT 38.8   < > 40.5 41.2 40.0 42.0  MCV 97.5   < > 93.6 95.2 97.6 100.0  PLT 76*   < > 89.0* 75* 76* 70*   < > = values in this interval not displayed.   Lab Results  Component Value Date   TSH 3.58 08/22/2018   Lab Results  Component Value Date   HGBA1C 5.6 11/13/2013   Lab Results  Component Value Date   CHOL 97 08/16/2017   HDL 37.70 (L) 08/16/2017   LDLCALC 48 08/16/2017   TRIG 56.0 08/16/2017   CHOLHDL 3 08/16/2017    Significant Diagnostic Results in last 30 days:  Dg Chest Port 1 View  Result Date: 04/17/2019 CLINICAL DATA:  Shortness of breath. EXAM: PORTABLE CHEST 1 VIEW COMPARISON:  April 12, 2019 FINDINGS: Mild developing opacity in the right base. The cardiomediastinal silhouette with cardiomegaly is identified and stable. No pneumothorax. No other acute abnormalities. IMPRESSION: Developing opacity in the right base could represent pneumonia. Recommend clinical correlation and short-term follow-up imaging to ensure resolution. Electronically Signed   By: Dorise Bullion III M.D   On: 04/17/2019 13:35    Assessment/Plan  #1 history of renal insufficiency with chronic kidney disease stage III-creatinine has risen some actually showed some mild improvement with the IV fluids-however this is complicated somewhat with her history of CHF and some very mildly increased edema-at this point will continue to monitor since this is somewhat challenging with her coexistent CHF history.  We will update a metabolic panel first laboratory day next week will have to keep an eye on her CO2 level as well.  2.-Hypernatremia as noted above this has  improved somewhat with IV fluids but this is complicated with her CHF history sodium has normalized at 141 will have this updated next week.  3.  History of CHF again she is currently off her Lasix secondary to her renal issues nonetheless this appears to be stable has had some slightly increased edema but this may be due to her recent IV fluids  #4 thrombocytopenia there is chronicity to this platelet count is down somewhat at 56,000 will have this updated next week as well she does not show increased bruising or bleeding from baseline.  5.  History of atrial fibrillation this appears rate controlled on Lopressor 37.5 mg twice daily she is also on verapamil 120 mg a day she is on Coumadin for anticoagulation INR has been therapeutic and update INR is pending.  6.  History of pulmonary embolism and DVT again she continues on Coumadin.  7.  History of asthma this is been stable during her stay here she does have albuterol as needed.  She also continues on Dulera-she does not complain of any increased cough or shortness of breath  8.  History of UTI she has completed antibiotic for UTI-again she did have some behavioral changes  several days ago but this appears to have resolved-.  She does continue on Flomax 0.4 mg a day  9.  History of suspected gout left middle finger this has improved with the colchicine.  10.  History of cognitive impairment with psychosis she is on Seroquel at night this appears stable her confusion earlier appears to have resolved.  11.  History of back pain with history of degenerative disc disease this appears to be controlled on the tramadol.  12.  History of coronary artery disease this is been relatively stable during her stay here without complaints of chest pain she continues on aspirin 81 mg a day in addition to atorvastatin 20 mg a day and Imdur 15 mg a day.  She also continues on Lopressor 37.5 mg twice daily   [id number]-of note greater than 40 minutes spent  assessing patient discussing her status with nursing staff-reviewing her chart and labs-and coordinating and formulating a plan of care for numerous diagnoses-of note greater than 50% of time spent coordinating a plan of care with input as noted above

## 2019-05-17 ENCOUNTER — Other Ambulatory Visit: Payer: Self-pay | Admitting: *Deleted

## 2019-05-17 NOTE — Patient Outreach (Signed)
Member assessed for potential [facility] Care Management needs as a benefit of  [place] [employer].  Member is currently receiving rehab therapy at  [facility].  Member discussed in weekly telephonic IDT meeting with  facility staff, [facility] UM team, and writer.  Facility reports family has appealed discharge from SNF. Member is from home with daughter.   Will plan outreach to daughter to discuss [place] Management services.   Marthenia Rolling, MSN-Ed, RN,BSN [place] Acute Care Coordinator [phone] [facility]) [phone]  (Toll free office)

## 2019-05-18 ENCOUNTER — Other Ambulatory Visit: Payer: Self-pay | Admitting: *Deleted

## 2019-05-18 LAB — BASIC METABOLIC PANEL
BUN: 58 — AB (ref 4–21)
CO2: 19 (ref 13–22)
Chloride: 110 — AB (ref 99–108)
Creatinine: 1.6 — AB (ref 0.5–1.1)
Glucose: 100
Potassium: 5.1 (ref 3.4–5.3)
Sodium: 142 (ref 137–147)

## 2019-05-18 LAB — COMPREHENSIVE METABOLIC PANEL
Calcium: 9.4 (ref 8.7–10.7)
GFR calc Af Amer: 33.04
GFR calc non Af Amer: 28.5

## 2019-05-18 LAB — CBC: RBC: 3.92 (ref 3.87–5.11)

## 2019-05-18 LAB — CBC AND DIFFERENTIAL
HCT: 36 (ref 36–46)
Hemoglobin: 12.2 (ref 12.0–16.0)
Neutrophils Absolute: 3
Platelets: 84 — AB (ref 150–399)
WBC: 4.6

## 2019-05-18 NOTE — Patient Outreach (Addendum)
Late entry for 05/17/19  Member assessed for potential [facility] Care Management needs as a benefit of [place] [employer].  Mrs. Standifer is currently receiving rehab therapy at [facility].   Spoke with facility Geographical information systems officer. States member won her appeal. Disposition plan has now changed to ALF placement closer to where the daughter lives.   Will continue to follow for disposition plans, progression, and for potential [facility] Care Management needs while member resides at [facility].  Addendum: Update from [facility] indicating [facility] disposition plan is now for LTC at [facility].  Marthenia Rolling, MSN-Ed, RN,BSN [place] Acute Care Coordinator [phone] [facility]) [phone]  (Toll free office)

## 2019-05-20 ENCOUNTER — Encounter: Payer: Self-pay | Admitting: Internal Medicine

## 2019-05-22 ENCOUNTER — Non-Acute Institutional Stay (SKILLED_NURSING_FACILITY): Payer: Medicare Other | Admitting: Internal Medicine

## 2019-05-22 DIAGNOSIS — Z8679 Personal history of other diseases of the circulatory system: Secondary | ICD-10-CM

## 2019-05-22 DIAGNOSIS — D696 Thrombocytopenia, unspecified: Secondary | ICD-10-CM | POA: Diagnosis not present

## 2019-05-22 DIAGNOSIS — N289 Disorder of kidney and ureter, unspecified: Secondary | ICD-10-CM

## 2019-05-22 NOTE — Progress Notes (Signed)
This is an acute visit.  Level of care skilled.  [facility]  Chief complaint acute visit follow-up renal insufficiency.  History of present illness.  Patient is a pleasant 85 year old female with a history of atrial fibrillation and post pulmonary embolism she is on chronic Coumadin as well as hypertension hyperlipidemia history of CVA as well as chronic kidney disease asthma colon cancer and history of weakness.  She was hospitalized in October for increased weakness and was treated for UTI.  X-rays of the lumbar spine show degenerative disc disease she also had acute kidney injury on top of chronic kidney disease which responded to IV fluids.  She has had some recent issues including mild hypernatremia which responded to IV fluids.  Recently her her creatinine appears to have risen somewhat above baseline she was given IV fluids with some mild improvement but creatinine did remain mildly elevated at 1.57-it was up over 1.6 at one time.  This is complicated with a history of C HF-so there is been some caution with her fluid resuscitation.  Last lab also showed her CO2 level was down somewhat at 16.  Clinically she appears to be stable at baseline at 1 point had some increased confusion but this appears to have resolved she does have some baseline cognitive impairment.  Updated labs  show her creatinine remains at 1.58 with a BUN of 58.3   .  CO2 level is improved at 19.  Per nursing staff her vital signs are stable and she is at her baseline mental status-.  Patient herself has no complaints today says she is having a fairly good day does not complain of shortness of breath or chest pain  Past Medical History:  Diagnosis Date  . Acute encephalopathy   . Acute renal failure superimposed on stage 3 chronic kidney disease ([place])   . Anemia   . Atrial fibrillation ([place])   . Colon cancer ([place]) 12/11  . Colon cancer ([place]) 2011  . Diarrhea   . DVT (deep  venous thrombosis) ([place]) 11/06  . Elevated brain natriuretic peptide (BNP) level   . Frequent headaches   . Heart murmur   . Hyperkalemia   . Hypertension   . Pulmonary embolism ([place]) 11/06  . Seizures ([place])   . Stroke ([place]) 11/30/2014  . Urine retention 01/2019        Past Surgical History:  Procedure Laterality Date  . APPENDECTOMY  1952  . COLECTOMY  06/2010   partial, Dr. Donne Hazel complicated by LLE CVT; S/P IVC umbrella & anemia  . HIP FRACTURE SURGERY  2006   Trauma  . TOTAL ABDOMINAL HYSTERECTOMY W/ BILATERAL SALPINGOOPHORECTOMY  1973   For Fibroids  . TOTAL HIP ARTHROPLASTY  01/03/07   Left hip replacement.  Marland Kitchen VENA CAVA FILTER PLACEMENT  06/28/2010    No Known Allergies      MEDICATIONS   Medication Sig  . acetaminophen (TYLENOL) 500 MG tablet Take 1,000 mg by mouth every 8 (eight) hours as needed for headache (pain).   Marland Kitchen albuterol (PROVENTIL) (2.5 MG/3ML) 0.083% nebulizer solution Take 2.5 mg by nebulization every 6 (six) hours as needed for wheezing or shortness of breath.  Marland Kitchen albuterol (VENTOLIN HFA) 108 (90 Base) MCG/ACT inhaler Inhale 2 puffs into the lungs every 6 (six) hours as needed for wheezing or shortness of breath.  Marland Kitchen aspirin 81 MG chewable tablet Chew 81 mg by mouth daily.  Marland Kitchen atorvastatin (LIPITOR) 20 MG tablet Take 1 tablet (20 mg  total) by mouth daily.  . calcium-vitamin D (OSCAL WITH D) 500-200 MG-UNIT TABS tablet Take 1 tablet by mouth daily.  . Ensure (ENSURE) Take 237 mLs by mouth 2 (two) times daily.  . isosorbide mononitrate (IMDUR) 30 MG 24 hr tablet Take 0.5 tablets (15 mg total) by mouth daily.  Marland Kitchen loratadine (CLARITIN) 10 MG tablet Take 1 tablet (10 mg total) by mouth daily as needed (seasonal allergies).  . metoprolol tartrate (LOPRESSOR) 25 MG tablet Take 1.5 tablets (37.5 mg total) by mouth 2 (two) times daily.  . mometasone-formoterol (DULERA) 100-5 MCG/ACT AERO Inhale 2 puffs into the lungs 2 (two) times daily.  .  Multiple Vitamin (MULTIVITAMIN WITH MINERALS) TABS tablet Take 1 tablet by mouth daily.  Marland Kitchen nystatin (MYCOSTATIN/NYSTOP) powder Apply topically daily. Apply under bilateral breasts QD  . polyethylene glycol (MIRALAX / GLYCOLAX) packet Take 17 g by mouth daily as needed.  . promethazine (PHENERGAN) 12.5 MG tablet Take 1 tablet (12.5 mg total) by mouth 3 (three) times daily as needed for nausea or vomiting.  Marland Kitchen QUEtiapine (SEROQUEL) 25 MG tablet Take 2 tablets (50 mg total) by mouth at bedtime.  . tamsulosin (FLOMAX) 0.4 MG CAPS capsule Take 1 capsule (0.4 mg total) by mouth daily.  . verapamil (VERELAN PM) 120 MG 24 hr capsule Take 120 mg by mouth at bedtime.  Marland Kitchen warfarin (COUMADIN) 2 MG tablet Take 2 mg by mouth daily. FOR AFIB   No facility-administered encounter medications on file as of 05/16/2019.      Social History        Tobacco Use  . Smoking status: Passive Smoke Exposure - Never Smoker  . Smokeless tobacco: Never Used  . Tobacco comment: husband smoked in home.   Substance Use Topics  . Alcohol use: No    Alcohol/week: 0.0 standard drinks    Family history is        Family History  Problem Relation Age of Onset  . Peripheral vascular disease Father   . Heart attack Mother 47  . Heart disease Mother        MI  . Coronary artery disease Sister   . Diabetes Paternal Grandmother   . Coronary artery disease Maternal Grandfather      Review of systems.  General she is not complaining of any fever or chills.  Skin is not complaining of rashes or itching did not note any increased bruising from baseline.  Head ears eyes nose mouth and throat does not complain of visual changes or sore throat.  Respiratory not complaining of shortness of breath or cough.  Cardiac does not complain of chest pain has some quite mild lower extremity edema this is quite mild more so in her right leg which is not new  GI is not complaining of abdominal discomfort nausea  vomiting diarrhea constipation.  GU no complaints of dysuria.  Musculoskeletal does have a history of back pain but at this point appears to be controlled does not complain of joint pain otherwise.  Neurologic is not complaining of feeling dizzy or syncopal or having a headache or numbness.  Psych she does not complain of being depressed or anxious she is pleasant and cooperative.  Physical exam.  Temperature 97.8 pulse 78 respirations 18 blood pressure 130/72.  In general this is a pleasant elderly female in no distress sitting in her chair.  Her skin is warm and dry do not note any new bruising.  Eyes visual acuity appears to be intact sclera and  conjunctive are clear.  Oropharynx clear mucous membranes moist.  Chest is clear to auscultation there is no labored breathing there is somewhat shallow air entry she is quite kyphotic.  Heart is irregular irregular rate and rhythm without murmur gallop or rub she has some right lower extremity edema which appears baseline minimal edema on the left.  Abdomen is soft nontender with positive bowel sounds.  Musculoskeletal does move all extremities x4 at baseline she is quite kyphotic strength appears to be intact baseline all 4 extremities.  Neurologic is grossly intact could not appreciate lateralizing findings cranial nerves appear to be intact her speech is clear.  Psych she is pleasant and appropriate has mild cognitive impairment but this appears to be baseline   Labs.  May 18, 2019.  WBC 4.6 hemoglobin 12.2 platelets 84,000.  Sodium 142 potassium 5.1 BUN 58.3 creatinine 1.58.  CO2 level was 19  May 15, 2019.  WBC 4.3 hemoglobin 12.9 platelets 56,000.  Sodium 141 potassium 4.8 BUN 43.1 creatinine 1.57.  CO2 level was 60   Assessment and plan.  1.  History of renal insufficiency with chronic kidney disease-creatinine appears to have leveled at around the 1.5-1.6 level BUN is elevated in the [id number] is  complicated with her history of CHF.  Her Lasix currently is on hold..  CO2 level has showed some improvement as well  Clinically she appears to be doing all right and stable-we will update a metabolic panel tomorrow for updated values and continue to monitor her status.  2.  CHF it appears this is stable despite being off the Lasix again this is complicated with her renal issues.  Again she will need close monitoring.  3.  History of thrombocytopenia this is chronic it appears improved somewhat from previous level at 56,000 currently 84,000 she does not show any increased evidence of bruising or bleeding   [id number]  greater than 25 minutes spent assessing patient-reviewing her chart and labs-discussing her status with nursing staff-coordinating and formulating a plan of care-of note greater than 50% of time spent coordinating a plan of care with input as noted above

## 2019-05-23 ENCOUNTER — Encounter: Payer: Self-pay | Admitting: Internal Medicine

## 2019-05-24 ENCOUNTER — Encounter: Payer: Self-pay | Admitting: Internal Medicine

## 2019-05-24 ENCOUNTER — Non-Acute Institutional Stay (SKILLED_NURSING_FACILITY): Payer: Medicare Other | Admitting: Internal Medicine

## 2019-05-24 DIAGNOSIS — Z9181 History of falling: Secondary | ICD-10-CM | POA: Diagnosis not present

## 2019-05-24 DIAGNOSIS — E870 Hyperosmolality and hypernatremia: Secondary | ICD-10-CM

## 2019-05-24 DIAGNOSIS — N183 Chronic kidney disease, stage 3 unspecified: Secondary | ICD-10-CM

## 2019-05-24 NOTE — Progress Notes (Signed)
This is an acute visit.  Level of care skilled.  [facility]  Chief complaint-acute visit follow-up renal insufficiency-also hypernatremia.  History of clonus.  Patient is a pleasant 85 year old female with a history of atrial fibrillation and a pulmonary embolism on chronic Coumadin she is also has a history of CVA as well as chronic kidney disease asthma colon cancer chronic weakness and hypertension.  She presented to the hospital with weakness back in October and was treated for a UTI.  X-rays of her lumbar spine show degenerative disc disease she also had acute kidney injury on top of chronic kidney disease-she did receive IV fluids.  During her stay here she has had some mild hypernatremia which responded to IV fluids.  Recent labs show her creatinine had risen up somewhat to the 1.5-1.6 area.  At 1 point we gave her IV fluids and had some mild improvement but still remained elevated at 1.57.  She does have a CHF history as well which complicates her matters with her IV fluids.  CO2 level is also been somewhat depressed it was 16 on a lab done early last week and then late last week was 19 on the lab done yesterday it is back at 106.  Her creatinine however has shown improvement on yesterday's lab at 1.0 and BUN is down to 20.9-sodium however is up somewhat at 147.  Today she appears to be at baseline she is eating her lunch and appears to have a pretty decent appetite and it is encouraging she actually is drinking her fluids which has been an issue per nursing.  Vital signs appear to be stable she does not have any complaints she is pleasant and appropriate  She did apparently have a fall couple nights ago was found sitting on her buttocks right in front of her chair it appears she probably slid out of her chair she continues to deny any pain   Physical exam.  Temperature is 97.8 pulse 66 respirations 19 blood pressure 122/66.  In general this is a very  pleasant elderly female no distress she is bright and alert today eating her lunch.  Her skin is warm and dry I do not note any new bruisin.   Eyes visual acuity appears to be intact sclera and conjunctive are clear.  Oropharynx is clear mucous membranes moist.  Chest is clear to auscultation with slightly shallow air entry there is no labored breathing.  Heart is irregularly irregular rhythm rate is slightly bradycardic in the mid 50s-she has baseline right lower extremity edema scant left lower extremity edema this appears relatively baseline with previous exam.  Abdomen is soft nontender with positive bowel sounds.  Musculoskeletal does move all extremities x4 it appears at baseline she is not complaining of any pain at this point.  Neurologic is grossly intact her speech is clear cannot appreciate lateralizing findings cranial nerves appear to be intact.  Psych she is pleasant and appropriate continues with some cognitive deficits was bright and alert and appeared to be doing well.  Labs.  May 23, 2019.  Sodium 147 potassium 4.7 BUN 20.9 creatinine 1.00 CO2 level 16.  May 18, 2019.  WBC 4.6 hemoglobin 12.2 platelets 84,000.  Sodium 142 potassium 5.1 BUN 58.3 creatinine 1.58 CO2 level was 19.      Past Medical History:  Diagnosis Date  . Acute encephalopathy   . Acute renal failure superimposed on stage 3 chronic kidney disease ([place])   . Anemia   .  Atrial fibrillation ([place])   . Colon cancer ([place]) 12/11  . Colon cancer ([place]) 2011  . Diarrhea   . DVT (deep venous thrombosis) ([place]) 11/06  . Elevated brain natriuretic peptide (BNP) level   . Frequent headaches   . Heart murmur   . Hyperkalemia   . Hypertension   . Pulmonary embolism ([place]) 11/06  . Seizures ([place])   . Stroke ([place]) 11/30/2014  . Urine retention 01/2019        Past Surgical History:  Procedure Laterality Date  . APPENDECTOMY  1952  . COLECTOMY  06/2010   partial,  Dr. Donne Hazel complicated by LLE CVT; S/P IVC umbrella & anemia  . HIP FRACTURE SURGERY  2006   Trauma  . TOTAL ABDOMINAL HYSTERECTOMY W/ BILATERAL SALPINGOOPHORECTOMY  1973   For Fibroids  . TOTAL HIP ARTHROPLASTY  01/03/07   Left hip replacement.  Marland Kitchen VENA CAVA FILTER PLACEMENT  06/28/2010    No Known Allergies      MEDICATIONS   Medication Sig  . acetaminophen (TYLENOL) 500 MG tablet Take 1,000 mg by mouth every 8 (eight) hours as needed for headache (pain).   Marland Kitchen albuterol (PROVENTIL) (2.5 MG/3ML) 0.083% nebulizer solution Take 2.5 mg by nebulization every 6 (six) hours as needed for wheezing or shortness of breath.  Marland Kitchen albuterol (VENTOLIN HFA) 108 (90 Base) MCG/ACT inhaler Inhale 2 puffs into the lungs every 6 (six) hours as needed for wheezing or shortness of breath.  Marland Kitchen aspirin 81 MG chewable tablet Chew 81 mg by mouth daily.  Marland Kitchen atorvastatin (LIPITOR) 20 MG tablet Take 1 tablet (20 mg total) by mouth daily.  . calcium-vitamin D (OSCAL WITH D) 500-200 MG-UNIT TABS tablet Take 1 tablet by mouth daily.  . Ensure (ENSURE) Take 237 mLs by mouth 2 (two) times daily.  . isosorbide mononitrate (IMDUR) 30 MG 24 hr tablet Take 0.5 tablets (15 mg total) by mouth daily.  Marland Kitchen loratadine (CLARITIN) 10 MG tablet Take 1 tablet (10 mg total) by mouth daily as needed (seasonal allergies).  . metoprolol tartrate (LOPRESSOR) 25 MG tablet Take 1.5 tablets (37.5 mg total) by mouth 2 (two) times daily.  . mometasone-formoterol (DULERA) 100-5 MCG/ACT AERO Inhale 2 puffs into the lungs 2 (two) times daily.  . Multiple Vitamin (MULTIVITAMIN WITH MINERALS) TABS tablet Take 1 tablet by mouth daily.  Marland Kitchen nystatin (MYCOSTATIN/NYSTOP) powder Apply topically daily. Apply under bilateral breasts QD  . polyethylene glycol (MIRALAX / GLYCOLAX) packet Take 17 g by mouth daily as needed.  . promethazine (PHENERGAN) 12.5 MG tablet Take 1 tablet (12.5 mg total) by mouth 3 (three) times daily as needed for nausea or  vomiting.  Marland Kitchen QUEtiapine (SEROQUEL) 25 MG tablet Take 2 tablets (50 mg total) by mouth at bedtime.  . tamsulosin (FLOMAX) 0.4 MG CAPS capsule Take 1 capsule (0.4 mg total) by mouth daily.  . verapamil (VERELAN PM) 120 MG 24 hr capsule Take 120 mg by mouth at bedtime.  Marland Kitchen warfarin (COUMADIN) 2 MG tablet Take 2 mg by mouth daily. FOR AFIB   No facility-administered encounter medications on file as of 05/16/2019.     Social History        Tobacco Use  . Smoking status: Passive Smoke Exposure - Never Smoker  . Smokeless tobacco: Never Used  . Tobacco comment: husband smoked in home.   Substance Use Topics  . Alcohol use: No    Alcohol/week: 0.0 standard drinks           Family  History  Problem Relation Age of Onset  . Peripheral vascular disease Father   . Heart attack Mother 38  . Heart disease Mother    MI  . Coronary artery disease Sister   . Diabetes Paternal Grandmother   . Coronary artery disease Maternal Grandfather     Review of systems-please see above.  Physical exam-please see above.  Labs please see above.  Assessment and plan.  1.  Chronic kidney disease-acute renal injury-creatinine has shown improvement on yesterday's lab at 1.0 with a BUN of 20.9-this appears to be more her baseline and is encouraging.  2.  Hypernatremia she does have a history of intermittent hypernatremia with a sodium of 147 on yesterday's lab-Per nursing she has somewhat spotty fluid intake although she appeared to be taking her fluids well today.--She does have orders for aggressive fluid resuscitation I did speak with nursing about this and they will try to encourage the strongly-we will update a BMP in a couple days to see if hypernatremia improves or progresses--this was discussed with Dr. [name]  3.  History of fall apparently no injuries she appears to be at her baseline today we have encouraged her to stay sitting in her chair but this continues to  be somewhat of a challenge-I suspect she kind of just slid off her chair and lowered herself to the floor but this will have to be watched  [phone]

## 2019-05-26 ENCOUNTER — Non-Acute Institutional Stay (SKILLED_NURSING_FACILITY): Payer: Medicare Other | Admitting: Internal Medicine

## 2019-05-26 DIAGNOSIS — R600 Localized edema: Secondary | ICD-10-CM

## 2019-05-27 ENCOUNTER — Encounter: Payer: Self-pay | Admitting: Internal Medicine

## 2019-05-27 NOTE — Progress Notes (Signed)
Location:   [facility] of Service:   SNF Provider: [name] MD  Colon Branch, MD  Patient Care Team: Colon Branch, MD as PCP - General (Internal Medicine)  Extended Emergency Contact Information Primary Emergency Contact: [facility] Address: [address]          [place], [place] [address] [name] of [place] Phone: [phone] Mobile Phone: [phone] Relation: Daughter    Allergies: Patient has no known allergies.  Chief Complaint  Patient presents with  . Acute Visit    HPI: Patient is 85 y.o. female who nursing asked me to see because she has developed some lower extremity edema.  Patient had been on Lasix 80 mg daily but it was stopped secondary to increased creatinine.  Patient has been monitored for development of edema.  Patient denies chest pain or shortness of breath.  There has been no weight gain, patient has lost a pound.  Past Medical History:  Diagnosis Date  . Acute encephalopathy   . Acute renal failure superimposed on stage 3 chronic kidney disease ([place])   . Anemia   . Atrial fibrillation ([place])   . Colon cancer ([place]) 12/11  . Colon cancer ([place]) 2011  . Diarrhea   . DVT (deep venous thrombosis) ([place]) 11/06  . Elevated brain natriuretic peptide (BNP) level   . Frequent headaches   . Heart murmur   . Hyperkalemia   . Hypertension   . Pulmonary embolism ([place]) 11/06  . Seizures ([place])   . Stroke ([place]) 11/30/2014  . Urine retention 01/2019    Past Surgical History:  Procedure Laterality Date  . APPENDECTOMY  1952  . COLECTOMY  06/2010   partial, Dr. Donne Hazel complicated by LLE CVT; S/P IVC umbrella & anemia  . HIP FRACTURE SURGERY  2006   Trauma  . TOTAL ABDOMINAL HYSTERECTOMY W/ BILATERAL SALPINGOOPHORECTOMY  1973   For Fibroids  . TOTAL HIP ARTHROPLASTY  01/03/07   Left hip replacement.  Marland Kitchen VENA CAVA FILTER PLACEMENT  06/28/2010    Allergies as of 05/26/2019   No Known Allergies     Medication List       Accurate as of May 26, 2019 11:59 PM. If you have any questions, ask your nurse or doctor.        acetaminophen 500 MG tablet Commonly known as: TYLENOL Take 1,000 mg by mouth every 8 (eight) hours as needed for headache (pain).   albuterol (2.5 MG/3ML) 0.083% nebulizer solution Commonly known as: PROVENTIL Take 2.5 mg by nebulization every 6 (six) hours as needed for wheezing or shortness of breath.   albuterol 108 (90 Base) MCG/ACT inhaler Commonly known as: VENTOLIN HFA Inhale 2 puffs into the lungs every 6 (six) hours as needed for wheezing or shortness of breath.   aspirin 81 MG chewable tablet Chew 81 mg by mouth daily.   atorvastatin 20 MG tablet Commonly known as: LIPITOR Take 1 tablet (20 mg total) by mouth daily.   calcium-vitamin D 500-200 MG-UNIT Tabs tablet Commonly known as: OSCAL WITH D Take 1 tablet by mouth daily.   Dulera 100-5 MCG/ACT Aero Generic drug: mometasone-formoterol Inhale 2 puffs into the lungs 2 (two) times daily.   Ensure Take 237 mLs by mouth 2 (two) times daily.   isosorbide mononitrate 30 MG 24 hr tablet Commonly known as: IMDUR Take 0.5 tablets (15 mg total) by mouth daily.   loratadine 10 MG tablet Commonly known as: CLARITIN Take 1 tablet (10  mg total) by mouth daily as needed (seasonal allergies).   metoprolol tartrate 25 MG tablet Commonly known as: LOPRESSOR Take 1.5 tablets (37.5 mg total) by mouth 2 (two) times daily.   multivitamin with minerals Tabs tablet Take 1 tablet by mouth daily.   nystatin powder Commonly known as: MYCOSTATIN/NYSTOP Apply topically daily. Apply under bilateral breasts QD   polyethylene glycol 17 g packet Commonly known as: MIRALAX / GLYCOLAX Take 17 g by mouth daily as needed.   promethazine 12.5 MG tablet Commonly known as: PHENERGAN Take 1 tablet (12.5 mg total) by mouth 3 (three) times daily as needed for nausea or vomiting.   QUEtiapine 25 MG tablet Commonly known as:  SEROQUEL Take 2 tablets (50 mg total) by mouth at bedtime.   tamsulosin 0.4 MG Caps capsule Commonly known as: FLOMAX Take 1 capsule (0.4 mg total) by mouth daily.   verapamil 120 MG 24 hr capsule Commonly known as: VERELAN PM Take 120 mg by mouth at bedtime.   warfarin 2 MG tablet Commonly known as: COUMADIN Take as directed by the anticoagulation clinic. If you are unsure how to take this medication, talk to your nurse or doctor. Original instructions: Take 2 mg by mouth daily. FOR AFIB       No orders of the defined types were placed in this encounter.   Immunization History  Administered Date(s) Administered  . Fluad Quad(high Dose 65+) 03/07/2019  . Influenza Split 03/30/2012  . Influenza Whole 04/30/2006, 04/20/2008, 04/15/2009  . Influenza, High Dose Seasonal PF 04/20/2013, 03/27/2015, 03/31/2016, 04/22/2017, 04/20/2018  . Influenza,inj,Quad PF,6+ Mos 05/23/2014  . Pneumococcal Conjugate-13 04/24/2015  . Pneumococcal Polysaccharide-23 07/06/2009  . Td 10/30/2016    Social History   Tobacco Use  . Smoking status: Passive Smoke Exposure - Never Smoker  . Smokeless tobacco: Never Used  . Tobacco comment: husband smoked in home.   Substance Use Topics  . Alcohol use: No    Alcohol/week: 0.0 standard drinks    Review of Systems  DATA OBTAINED: from patient, nurse GENERAL:  no fevers, fatigue, appetite changes SKIN: No itching, rash HEENT: No complaint RESPIRATORY: No cough, wheezing, SOB CARDIAC: No chest pain, palpitations, some lower extremity edema  GI: No abdominal pain, No N/V/D or constipation, No heartburn or reflux  GU: No dysuria, frequency or urgency, or incontinence  MUSCULOSKELETAL: No unrelieved bone/joint pain NEUROLOGIC: No headache, dizziness  PSYCHIATRIC: No overt anxiety or sadness  Vitals:   05/27/19 2338  BP: 130/79  Pulse: 78  Resp: 18  Temp: 98 F (36.7 C)   Body mass index is 29.74 kg/m. Physical Exam  GENERAL APPEARANCE:  Alert, conversant, No acute distress  SKIN: No diaphoresis rash HEENT: Unremarkable RESPIRATORY: Breathing is even, unlabored. Lung sounds are clear   CARDIOVASCULAR: Heart RRR no murmurs, rubs or gallops.  1/2-1+ edema to mid leg GASTROINTESTINAL: Abdomen is soft, non-tender, not distended w/ normal bowel sounds.  GENITOURINARY: Bladder non tender, not distended  MUSCULOSKELETAL: No abnormal joints or musculature NEUROLOGIC: Cranial nerves 2-12 grossly intact. Moves all extremities PSYCHIATRIC: Mood and affect appropriate to situation, no behavioral issues  Patient Active Problem List   Diagnosis Date Noted  . Altered behavior 05/10/2019  . Klebsiella cystitis 04/22/2019  . Metabolic encephalopathy [id number]  . Back pain 04/08/2019  . MCI (mild cognitive impairment) 03/07/2019  . Congestive heart failure (CHF) ([place]) 01/20/2019  . CAD (coronary artery disease) 06/07/2018  . Pulmonary hypertension, primary ([place]) 06/07/2018  . Generalized weakness   .  Abnormal LFTs 06/01/2018  . Hallucination 06/01/2018  . Abnormal chest x-ray 09/28/2015  . Edema of right lower extremity 09/28/2015  . Elevated brain natriuretic peptide (BNP) level 09/17/2015  . Seizures ([place]) 09/16/2015  . Asthma 08/14/2015  . PCP NOTES >>>>>>>>>>>>>>>>>>>>>>>>>>>>>> 04/24/2015  . TIA (transient ischemic attack) 12/11/2014  . Dyslipidemia 12/11/2014  . History of CVA (cerebrovascular accident) 12/01/2014  . Thrombocytopenia ([place]) 12/01/2014  . Chronic renal insufficiency, stage II (mild) 11/12/2013  . Impingement syndrome, shoulder, left 11/09/2013  . Acquired short bowel syndrome 11/09/2013  . Annual physical exam 08/30/2013  . Hx pulmonary embolism 06/27/2013  . Warfarin anticoagulation 06/27/2013  . DDD (degenerative disc disease), lumbar 11/18/2012  . ATRIAL FIBRILLATION   07/08/2010  . COLON CANCER, HX OF 07/08/2010  . DIZZINESS 10/18/2009  . Essential hypertension 12/16/2007  . HIP PAIN, CHRONIC  12/28/2006  . ANEMIA-NOS 07/15/2006  . Personal history of venous thrombosis and embolism 07/15/2006    CMP     Component Value Date/Time   NA 145 04/14/2019 0532   NA 146 06/27/2018   NA 142 06/09/2012 1509   K 3.8 04/14/2019 0532   K 3.9 06/09/2012 1509   CL 109 04/14/2019 0532   CL 108 (H) 06/09/2012 1509   CO2 24 04/14/2019 0532   CO2 26 06/09/2012 1509   GLUCOSE 103 (H) 04/14/2019 0532   GLUCOSE 103 (H) 06/09/2012 1509   BUN 26 (H) 04/14/2019 0532   BUN 47 (A) 06/27/2018   BUN 17.0 06/09/2012 1509   CREATININE 1.13 (H) 04/14/2019 0532   CREATININE 1.51 (H) 07/22/2018 1624   CREATININE 1.2 (H) 06/09/2012 1509   CALCIUM 9.4 04/14/2019 0532   CALCIUM 9.5 06/09/2012 1509   PROT 6.9 04/07/2019 1530   PROT 6.9 01/13/2018 1620   PROT 7.2 06/09/2012 1509   ALBUMIN 4.7 04/07/2019 1530   ALBUMIN 4.6 01/13/2018 1620   ALBUMIN 4.2 06/09/2012 1509   AST 31 04/07/2019 1530   AST 17 06/09/2012 1509   ALT 15 04/07/2019 1530   ALT 11 06/09/2012 1509   ALKPHOS 56 04/07/2019 1530   ALKPHOS 47 06/09/2012 1509   BILITOT 1.4 (H) 04/07/2019 1530   BILITOT 1.1 01/13/2018 1620   BILITOT 0.73 06/09/2012 1509   GFRNONAA 43 (L) 04/14/2019 0532   GFRAA 50 (L) 04/14/2019 0532   Recent Labs    01/22/19 0405 01/24/19 0432  04/08/19 0021  04/11/19 0556 04/12/19 0536 04/14/19 0532  NA 142 144   < > 144   < > 148* 147* 145  K 3.2* 4.7   < > 3.5   < > 4.3 4.1 3.8  CL 105 112*   < > 107   < > 111 108 109  CO2 27 24   < > 27   < > 27 25 24   GLUCOSE 92 107*   < > 114*   < > 87 93 103*  BUN 24* 25*   < > 41*   < > 31* 29* 26*  CREATININE 1.41* 1.33*   < > 1.96*   < > 1.28* 1.26* 1.13*  CALCIUM 8.5* 9.4   < > 9.2   < > 9.0 9.3 9.4  MG 2.1 2.0  --  2.2  --   --   --   --   PHOS 3.4  --   --  4.0  --   --   --   --    < > = values in this interval not displayed.  Recent Labs    01/20/19 0918 03/07/19 1454 04/07/19 1530  AST 25 17 31   ALT 15 10 15   ALKPHOS 41 43 56  BILITOT 0.7 0.8  1.4*  PROT 5.9* 6.6 6.9  ALBUMIN 3.8 4.1 4.7   Recent Labs    01/20/19 0918  03/07/19 1454 04/07/19 1530 04/08/19 0021 04/12/19 0536  WBC 4.6   < > 6.3 8.2 6.9 5.3  NEUTROABS 3.1  --  4.6 6.5  --   --   HGB 12.0   < > 13.3 13.3 12.7 12.9  HCT 38.8   < > 40.5 41.2 40.0 42.0  MCV 97.5   < > 93.6 95.2 97.6 100.0  PLT 76*   < > 89.0* 75* 76* 70*   < > = values in this interval not displayed.   No results for input(s): CHOL, LDLCALC, TRIG in the last 8760 hours.  Invalid input(s): HCL No results found for: [facility] Lab Results  Component Value Date   TSH 3.58 08/22/2018   Lab Results  Component Value Date   HGBA1C 5.6 11/13/2013   Lab Results  Component Value Date   CHOL 97 08/16/2017   HDL 37.70 (L) 08/16/2017   LDLCALC 48 08/16/2017   TRIG 56.0 08/16/2017   CHOLHDL 3 08/16/2017    Significant Diagnostic Results in last 30 days:  No results found.  Assessment and Plan  Lower extremity edema-patient's creatinine came back today at 1.02; will restart Lasix but at 40 mg daily instead of 80; will monitor response     [name], MD

## 2019-05-28 ENCOUNTER — Encounter: Payer: Self-pay | Admitting: Internal Medicine

## 2019-05-29 ENCOUNTER — Encounter: Payer: Self-pay | Admitting: Internal Medicine

## 2019-05-29 ENCOUNTER — Non-Acute Institutional Stay (SKILLED_NURSING_FACILITY): Payer: Medicare Other | Admitting: Internal Medicine

## 2019-05-29 DIAGNOSIS — I4811 Longstanding persistent atrial fibrillation: Secondary | ICD-10-CM

## 2019-05-29 DIAGNOSIS — Z7901 Long term (current) use of anticoagulants: Secondary | ICD-10-CM

## 2019-05-29 NOTE — Progress Notes (Signed)
Location:  [facility] Room Number: 212-D Place of Service:  SNF [phone]) Provider:  Norma Fredrickson, MD  Patient Care Team: Colon Branch, MD as PCP - General (Internal Medicine)  Extended Emergency Contact Information Primary Emergency Contact: [name] Address: [address]          [place], [place] [address] [name] of [place] Phone: [phone] Mobile Phone: [phone] Relation: Daughter  Code Status:  FULL CODE  Goals of care: Advanced Directive information Advanced Directives 05/15/2019  Does Patient Have a Medical Advance Directive? Yes  Type of Advance Directive (No Data)  Does patient want to make changes to medical advance directive? No - Patient declined  Copy of [place] in Chart? -  Would patient like information on creating a medical advance directive? -     Chief Complaint  Patient presents with  . Acute Visit    Anticoagulation management    HPI:  Pt is a 85 y.o. female seen today for an acute visit for    Past Medical History:  Diagnosis Date  . Acute encephalopathy   . Acute renal failure superimposed on stage 3 chronic kidney disease ([place])   . Anemia   . Atrial fibrillation ([place])   . Colon cancer ([place]) 12/11  . Colon cancer ([place]) 2011  . Diarrhea   . DVT (deep venous thrombosis) ([place]) 11/06  . Elevated brain natriuretic peptide (BNP) level   . Frequent headaches   . Heart murmur   . Hyperkalemia   . Hypertension   . Pulmonary embolism ([place]) 11/06  . Seizures ([place])   . Stroke ([place]) 11/30/2014  . Urine retention 01/2019   Past Surgical History:  Procedure Laterality Date  . APPENDECTOMY  1952  . COLECTOMY  06/2010   partial, Dr. Donne Hendricks complicated by LLE CVT; S/P IVC umbrella & anemia  . HIP FRACTURE SURGERY  2006   Trauma  . TOTAL ABDOMINAL HYSTERECTOMY W/ BILATERAL SALPINGOOPHORECTOMY  1973   For Fibroids  . TOTAL HIP ARTHROPLASTY  01/03/07   Left hip replacement.  Marland Kitchen  VENA CAVA FILTER PLACEMENT  06/28/2010    No Known Allergies  Outpatient Encounter Medications as of 05/29/2019  Medication Sig  . acetaminophen (TYLENOL) 500 MG tablet Take 1,000 mg by mouth every 8 (eight) hours as needed for headache (pain).   Marland Kitchen albuterol (PROVENTIL) (2.5 MG/3ML) 0.083% nebulizer solution Take 2.5 mg by nebulization every 6 (six) hours as needed for wheezing or shortness of breath.  Marland Kitchen albuterol (VENTOLIN HFA) 108 (90 Base) MCG/ACT inhaler Inhale 2 puffs into the lungs every 6 (six) hours as needed for wheezing or shortness of breath.  Marland Kitchen aspirin 81 MG chewable tablet Chew 81 mg by mouth daily.  Marland Kitchen atorvastatin (LIPITOR) 20 MG tablet Take 1 tablet (20 mg total) by mouth daily.  . calcium-vitamin D (OSCAL WITH D) 500-200 MG-UNIT TABS tablet Take 1 tablet by mouth daily.  . Ensure (ENSURE) Take 237 mLs by mouth 2 (two) times daily.  . furosemide (LASIX) 40 MG tablet Take 40 mg by mouth daily.  . isosorbide mononitrate (IMDUR) 30 MG 24 hr tablet Take 0.5 tablets (15 mg total) by mouth daily.  Marland Kitchen loratadine (CLARITIN) 10 MG tablet Take 1 tablet (10 mg total) by mouth daily as needed (seasonal allergies).  . metoprolol tartrate (LOPRESSOR) 25 MG tablet Take 1.5 tablets (37.5 mg total) by mouth 2 (two) times daily.  . mometasone-formoterol (DULERA) 100-5 MCG/ACT AERO Inhale  2 puffs into the lungs 2 (two) times daily.  . Multiple Vitamin (MULTIVITAMIN WITH MINERALS) TABS tablet Take 1 tablet by mouth daily.  Marland Kitchen nystatin (MYCOSTATIN/NYSTOP) powder Apply topically daily. Apply under bilateral breasts QD  . polyethylene glycol (MIRALAX / GLYCOLAX) packet Take 17 g by mouth daily as needed.  . promethazine (PHENERGAN) 12.5 MG tablet Take 1 tablet (12.5 mg total) by mouth 3 (three) times daily as needed for nausea or vomiting.  Marland Kitchen QUEtiapine (SEROQUEL) 25 MG tablet Take 2 tablets (50 mg total) by mouth at bedtime.  . tamsulosin (FLOMAX) 0.4 MG CAPS capsule Take 1 capsule (0.4 mg total) by  mouth daily.  . verapamil (VERELAN PM) 120 MG 24 hr capsule Take 120 mg by mouth at bedtime.  Marland Kitchen warfarin (COUMADIN) 2 MG tablet Take 2 mg by mouth daily. FOR AFIB   No facility-administered encounter medications on file as of 05/29/2019.     Review of Systems  Immunization History  Administered Date(s) Administered  . Fluad Quad(high Dose 65+) 03/07/2019  . Influenza Split 03/30/2012  . Influenza Whole 04/30/2006, 04/20/2008, 04/15/2009  . Influenza, High Dose Seasonal PF 04/20/2013, 03/27/2015, 03/31/2016, 04/22/2017, 04/20/2018  . Influenza,inj,Quad PF,6+ Mos 05/23/2014  . Pneumococcal Conjugate-13 04/24/2015  . Pneumococcal Polysaccharide-23 07/06/2009  . Td 10/30/2016   Pertinent  Health Maintenance Due  Topic Date Due  . DEXA SCAN  02/27/2020 (Originally 11/26/1993)  . INFLUENZA VACCINE  Completed  . PNA vac Low Risk Adult  Completed   Fall Risk  07/22/2018 01/18/2018 10/30/2016 08/14/2015 04/24/2015  Falls in the past year? 1 No No No No  Number falls in past yr: 1 - - - -  Injury with Fall? 0 - - - -   Functional Status Survey:    Vitals:   05/29/19 1418  BP: 120/60  Pulse: 88  Resp: 18  Temp: (!) 97.4 F (36.3 C)  TempSrc: Oral  SpO2: 96%  Weight: 167 lb (75.8 kg)  Height: 5\' 3"  (1.6 m)   Body mass index is 29.58 kg/m. Physical Exam  Labs reviewed: Recent Labs    01/22/19 0405 01/24/19 0432  04/08/19 0021  04/11/19 0556 04/12/19 0536 04/14/19 0532  NA 142 144   < > 144   < > 148* 147* 145  K 3.2* 4.7   < > 3.5   < > 4.3 4.1 3.8  CL 105 112*   < > 107   < > 111 108 109  CO2 27 24   < > 27   < > 27 25 24   GLUCOSE 92 107*   < > 114*   < > 87 93 103*  BUN 24* 25*   < > 41*   < > 31* 29* 26*  CREATININE 1.41* 1.33*   < > 1.96*   < > 1.28* 1.26* 1.13*  CALCIUM 8.5* 9.4   < > 9.2   < > 9.0 9.3 9.4  MG 2.1 2.0  --  2.2  --   --   --   --   PHOS 3.4  --   --  4.0  --   --   --   --    < > = values in this interval not displayed.   Recent Labs     01/20/19 0918 03/07/19 1454 04/07/19 1530  AST 25 17 31   ALT 15 10 15   ALKPHOS 41 43 56  BILITOT 0.7 0.8 1.4*  PROT 5.9* 6.6 6.9  ALBUMIN 3.8 4.1 4.7  Recent Labs    01/20/19 0918  03/07/19 1454 04/07/19 1530 04/08/19 0021 04/12/19 0536  WBC 4.6   < > 6.3 8.2 6.9 5.3  NEUTROABS 3.1  --  4.6 6.5  --   --   HGB 12.0   < > 13.3 13.3 12.7 12.9  HCT 38.8   < > 40.5 41.2 40.0 42.0  MCV 97.5   < > 93.6 95.2 97.6 100.0  PLT 76*   < > 89.0* 75* 76* 70*   < > = values in this interval not displayed.   Lab Results  Component Value Date   TSH 3.58 08/22/2018   Lab Results  Component Value Date   HGBA1C 5.6 11/13/2013   Lab Results  Component Value Date   CHOL 97 08/16/2017   HDL 37.70 (L) 08/16/2017   LDLCALC 48 08/16/2017   TRIG 56.0 08/16/2017   CHOLHDL 3 08/16/2017    Significant Diagnostic Results in last 30 days:  No results found.  Assessment/Plan There are no diagnoses linked to this encounter.     Granville Lewis, PA-C [phone]

## 2019-05-30 ENCOUNTER — Encounter: Payer: Self-pay | Admitting: Internal Medicine

## 2019-05-30 DIAGNOSIS — I10 Essential (primary) hypertension: Secondary | ICD-10-CM | POA: Diagnosis not present

## 2019-05-30 DIAGNOSIS — E559 Vitamin D deficiency, unspecified: Secondary | ICD-10-CM | POA: Diagnosis not present

## 2019-05-30 NOTE — Progress Notes (Signed)
Location:  [facility] Room Number: 212-D Place of Service:  SNF (31) Provider: D  MD  Colon Branch, MD  Patient Care Team: Colon Branch, MD as PCP - General (Internal Medicine)  Extended Emergency Contact Information Primary Emergency Contact: [facility] Address: [address]          [place], [place] [address] [name] of [place] Phone: [phone] Mobile Phone: [phone] Relation: Daughter    Allergies: Patient has no known allergies.  Chief Complaint  Patient presents with  . Acute Visit    Anticoagulation management    HPI: Patient is 85 y.o. female who is being seen for management of her Coumadin.  Patient has been on 2 mg daily and her INR is 1.7.  There are no complaints.  Past Medical History:  Diagnosis Date  . Acute encephalopathy   . Acute renal failure superimposed on stage 3 chronic kidney disease ([place])   . Anemia   . Atrial fibrillation ([place])   . Colon cancer ([place]) 12/11  . Colon cancer ([place]) 2011  . Diarrhea   . DVT (deep venous thrombosis) ([place]) 11/06  . Elevated brain natriuretic peptide (BNP) level   . Frequent headaches   . Heart murmur   . Hyperkalemia   . Hypertension   . Pulmonary embolism ([place]) 11/06  . Seizures ([place])   . Stroke ([place]) 11/30/2014  . Urine retention 01/2019    Past Surgical History:  Procedure Laterality Date  . APPENDECTOMY  1952  . COLECTOMY  06/2010   partial, Dr. Donne Hazel complicated by LLE CVT; S/P IVC umbrella & anemia  . HIP FRACTURE SURGERY  2006   Trauma  . TOTAL ABDOMINAL HYSTERECTOMY W/ BILATERAL SALPINGOOPHORECTOMY  1973   For Fibroids  . TOTAL HIP ARTHROPLASTY  01/03/07   Left hip replacement.  Marland Kitchen VENA CAVA FILTER PLACEMENT  06/28/2010    Allergies as of 05/29/2019   No Known Allergies     Medication List       Accurate as of May 29, 2019 11:59 PM. If you have any questions, ask your nurse or doctor.        acetaminophen  500 MG tablet Commonly known as: TYLENOL Take 1,000 mg by mouth every 8 (eight) hours as needed for headache (pain).   albuterol (2.5 MG/3ML) 0.083% nebulizer solution Commonly known as: PROVENTIL Take 2.5 mg by nebulization every 6 (six) hours as needed for wheezing or shortness of breath.   albuterol 108 (90 Base) MCG/ACT inhaler Commonly known as: VENTOLIN HFA Inhale 2 puffs into the lungs every 6 (six) hours as needed for wheezing or shortness of breath.   aspirin 81 MG chewable tablet Chew 81 mg by mouth daily.   atorvastatin 20 MG tablet Commonly known as: LIPITOR Take 1 tablet (20 mg total) by mouth daily.   calcium-vitamin D 500-200 MG-UNIT Tabs tablet Commonly known as: OSCAL WITH D Take 1 tablet by mouth daily.   Dulera 100-5 MCG/ACT Aero Generic drug: mometasone-formoterol Inhale 2 puffs into the lungs 2 (two) times daily.   Ensure Take 237 mLs by mouth 2 (two) times daily.   furosemide 40 MG tablet Commonly known as: LASIX Take 40 mg by mouth daily.   isosorbide mononitrate 30 MG 24 hr tablet Commonly known as: IMDUR Take 0.5 tablets (15 mg total) by mouth daily.   loratadine 10 MG tablet Commonly known as: CLARITIN Take 1 tablet (10 mg total) by mouth daily as needed (seasonal  allergies).   metoprolol tartrate 25 MG tablet Commonly known as: LOPRESSOR Take 1.5 tablets (37.5 mg total) by mouth 2 (two) times daily.   multivitamin with minerals Tabs tablet Take 1 tablet by mouth daily.   nystatin powder Commonly known as: MYCOSTATIN/NYSTOP Apply topically daily. Apply under bilateral breasts QD   polyethylene glycol 17 g packet Commonly known as: MIRALAX / GLYCOLAX Take 17 g by mouth daily as needed.   promethazine 12.5 MG tablet Commonly known as: PHENERGAN Take 1 tablet (12.5 mg total) by mouth 3 (three) times daily as needed for nausea or vomiting.   QUEtiapine 25 MG tablet Commonly known as: SEROQUEL Take 2 tablets (50 mg total) by mouth at  bedtime.   tamsulosin 0.4 MG Caps capsule Commonly known as: FLOMAX Take 1 capsule (0.4 mg total) by mouth daily.   verapamil 120 MG 24 hr capsule Commonly known as: VERELAN PM Take 120 mg by mouth at bedtime.   warfarin 2 MG tablet Commonly known as: COUMADIN Take as directed by the anticoagulation clinic. If you are unsure how to take this medication, talk to your nurse or doctor. Original instructions: Take 2 mg by mouth daily. Take 2 mg p.o. Tuesday Thursday Saturday and Sunday-2.5 mg on Monday Wednesday and Fridays       No orders of the defined types were placed in this encounter.   Immunization History  Administered Date(s) Administered  . Fluad Quad(high Dose 65+) 03/07/2019  . Influenza Split 03/30/2012  . Influenza Whole 04/30/2006, 04/20/2008, 04/15/2009  . Influenza, High Dose Seasonal PF 04/20/2013, 03/27/2015, 03/31/2016, 04/22/2017, 04/20/2018  . Influenza,inj,Quad PF,6+ Mos 05/23/2014  . Pneumococcal Conjugate-13 04/24/2015  . Pneumococcal Polysaccharide-23 07/06/2009  . Td 10/30/2016    Social History   Tobacco Use  . Smoking status: Passive Smoke Exposure - Never Smoker  . Smokeless tobacco: Never Used  . Tobacco comment: husband smoked in home.   Substance Use Topics  . Alcohol use: No    Alcohol/week: 0.0 standard drinks     Vitals:   05/30/19 0754  BP: 120/60  Pulse: 88  Resp: 18  Temp: (!) 97.4 F (36.3 C)  SpO2: 96%   Body mass index is 29.58 kg/m.   Patient Active Problem List   Diagnosis Date Noted  . Altered behavior 05/10/2019  . Klebsiella cystitis 04/22/2019  . Metabolic encephalopathy [id number]  . Back pain 04/08/2019  . MCI (mild cognitive impairment) 03/07/2019  . Congestive heart failure (CHF) ([place]) 01/20/2019  . CAD (coronary artery disease) 06/07/2018  . Pulmonary hypertension, primary ([place]) 06/07/2018  . Generalized weakness   . Abnormal LFTs 06/01/2018  . Hallucination 06/01/2018  . Abnormal chest x-ray  09/28/2015  . Edema of right lower extremity 09/28/2015  . Elevated brain natriuretic peptide (BNP) level 09/17/2015  . Seizures ([place]) 09/16/2015  . Asthma 08/14/2015  . PCP NOTES >>>>>>>>>>>>>>>>>>>>>>>>>>>>>> 04/24/2015  . TIA (transient ischemic attack) 12/11/2014  . Dyslipidemia 12/11/2014  . History of CVA (cerebrovascular accident) 12/01/2014  . Thrombocytopenia ([place]) 12/01/2014  . Chronic renal insufficiency, stage II (mild) 11/12/2013  . Impingement syndrome, shoulder, left 11/09/2013  . Acquired short bowel syndrome 11/09/2013  . Annual physical exam 08/30/2013  . Hx pulmonary embolism 06/27/2013  . Warfarin anticoagulation 06/27/2013  . DDD (degenerative disc disease), lumbar 11/18/2012  . ATRIAL FIBRILLATION   07/08/2010  . COLON CANCER, HX OF 07/08/2010  . DIZZINESS 10/18/2009  . Essential hypertension 12/16/2007  . HIP PAIN, CHRONIC 12/28/2006  . ANEMIA-NOS 07/15/2006  .  Personal history of venous thrombosis and embolism 07/15/2006    CMP     Component Value Date/Time   NA 145 04/14/2019 0532   NA 146 06/27/2018   NA 142 06/09/2012 1509   K 3.8 04/14/2019 0532   K 3.9 06/09/2012 1509   CL 109 04/14/2019 0532   CL 108 (H) 06/09/2012 1509   CO2 24 04/14/2019 0532   CO2 26 06/09/2012 1509   GLUCOSE 103 (H) 04/14/2019 0532   GLUCOSE 103 (H) 06/09/2012 1509   BUN 26 (H) 04/14/2019 0532   BUN 47 (A) 06/27/2018   BUN 17.0 06/09/2012 1509   CREATININE 1.13 (H) 04/14/2019 0532   CREATININE 1.51 (H) 07/22/2018 1624   CREATININE 1.2 (H) 06/09/2012 1509   CALCIUM 9.4 04/14/2019 0532   CALCIUM 9.5 06/09/2012 1509   PROT 6.9 04/07/2019 1530   PROT 6.9 01/13/2018 1620   PROT 7.2 06/09/2012 1509   ALBUMIN 4.7 04/07/2019 1530   ALBUMIN 4.6 01/13/2018 1620   ALBUMIN 4.2 06/09/2012 1509   AST 31 04/07/2019 1530   AST 17 06/09/2012 1509   ALT 15 04/07/2019 1530   ALT 11 06/09/2012 1509   ALKPHOS 56 04/07/2019 1530   ALKPHOS 47 06/09/2012 1509   BILITOT 1.4 (H)  04/07/2019 1530   BILITOT 1.1 01/13/2018 1620   BILITOT 0.73 06/09/2012 1509   GFRNONAA 43 (L) 04/14/2019 0532   GFRAA 50 (L) 04/14/2019 0532   Recent Labs    01/22/19 0405 01/24/19 0432  04/08/19 0021  04/11/19 0556 04/12/19 0536 04/14/19 0532  NA 142 144   < > 144   < > 148* 147* 145  K 3.2* 4.7   < > 3.5   < > 4.3 4.1 3.8  CL 105 112*   < > 107   < > 111 108 109  CO2 27 24   < > 27   < > 27 25 24   GLUCOSE 92 107*   < > 114*   < > 87 93 103*  BUN 24* 25*   < > 41*   < > 31* 29* 26*  CREATININE 1.41* 1.33*   < > 1.96*   < > 1.28* 1.26* 1.13*  CALCIUM 8.5* 9.4   < > 9.2   < > 9.0 9.3 9.4  MG 2.1 2.0  --  2.2  --   --   --   --   PHOS 3.4  --   --  4.0  --   --   --   --    < > = values in this interval not displayed.   Recent Labs    01/20/19 0918 03/07/19 1454 04/07/19 1530  AST 25 17 31   ALT 15 10 15   ALKPHOS 41 43 56  BILITOT 0.7 0.8 1.4*  PROT 5.9* 6.6 6.9  ALBUMIN 3.8 4.1 4.7   Recent Labs    01/20/19 0918  03/07/19 1454 04/07/19 1530 04/08/19 0021 04/12/19 0536  WBC 4.6   < > 6.3 8.2 6.9 5.3  NEUTROABS 3.1  --  4.6 6.5  --   --   HGB 12.0   < > 13.3 13.3 12.7 12.9  HCT 38.8   < > 40.5 41.2 40.0 42.0  MCV 97.5   < > 93.6 95.2 97.6 100.0  PLT 76*   < > 89.0* 75* 76* 70*   < > = values in this interval not displayed.   No results for input(s): CHOL, LDLCALC, TRIG in the last  8760 hours.  Invalid input(s): HCL No results found for: [facility] Lab Results  Component Value Date   TSH 3.58 08/22/2018   Lab Results  Component Value Date   HGBA1C 5.6 11/13/2013   Lab Results  Component Value Date   CHOL 97 08/16/2017   HDL 37.70 (L) 08/16/2017   LDLCALC 48 08/16/2017   TRIG 56.0 08/16/2017   CHOLHDL 3 08/16/2017    Significant Diagnostic Results in last 30 days:  No results found.  Assessment and Plan  Atrial fibrillation/anticoagulated on Coumadin-patient's INR is 1.7 on 2 mg daily; plan to give an extra dose of 2 mg today for dose of 4 mg  total and then back to 2 mg daily and recheck in 7 days    [name] , MD

## 2019-05-31 ENCOUNTER — Non-Acute Institutional Stay (SKILLED_NURSING_FACILITY): Payer: Medicare Other | Admitting: Internal Medicine

## 2019-05-31 DIAGNOSIS — Z87448 Personal history of other diseases of urinary system: Secondary | ICD-10-CM | POA: Diagnosis not present

## 2019-05-31 DIAGNOSIS — E870 Hyperosmolality and hypernatremia: Secondary | ICD-10-CM

## 2019-05-31 DIAGNOSIS — I10 Essential (primary) hypertension: Secondary | ICD-10-CM | POA: Diagnosis not present

## 2019-05-31 DIAGNOSIS — Z8679 Personal history of other diseases of the circulatory system: Secondary | ICD-10-CM | POA: Diagnosis not present

## 2019-05-31 DIAGNOSIS — D649 Anemia, unspecified: Secondary | ICD-10-CM | POA: Diagnosis not present

## 2019-05-31 DIAGNOSIS — Z789 Other specified health status: Secondary | ICD-10-CM | POA: Diagnosis not present

## 2019-05-31 NOTE — Progress Notes (Signed)
This is an acute visit.  Level care skilled.  [facility]ility [facility]  Chief complaint is acute visit follow-up hypernatremia.  History of present illness.  Patient is a pleasant 85 year old female seen today for a lab which shows a sodium of 151.  She does have a history of intermittent hypernatremia as well as lower extremity edema-atrial fibrillation-pulmonary embolism on chronic Coumadin as well as a history of hypertension hyperlipidemia and chronic kidney disease.  She was seen recently for increased edema her Lasix was on hold because of renal issues but it was started back at 40 mg a day.  And this appears to have helped however her sodium has crept up to 151.  Creatinine 0.99 BUN of 26.8 potassium is 4.6 CO2 level 26 patient states she is drinking adequate fluids-although she can be a poor historian at times.  Clinically she appears to be doing well and vital signs are stable she has no complaints today  Past Medical History:  Diagnosis Date  . Acute encephalopathy   . Acute renal failure superimposed on stage 3 chronic kidney disease ([place])   . Anemia   . Atrial fibrillation ([place])   . Colon cancer ([place]) 12/11  . Colon cancer ([place]) 2011  . Diarrhea   . DVT (deep venous thrombosis) ([place]) 11/06  . Elevated brain natriuretic peptide (BNP) level   . Frequent headaches   . Heart murmur   . Hyperkalemia   . Hypertension   . Pulmonary embolism ([place]) 11/06  . Seizures ([place])   . Stroke ([place]) 11/30/2014  . Urine retention 01/2019         Past Surgical History:  Procedure Laterality Date  . APPENDECTOMY  1952  . COLECTOMY  06/2010   partial, Dr. Donne Hazel complicated by LLE CVT; S/P IVC umbrella & anemia  . HIP FRACTURE SURGERY  2006   Trauma  . TOTAL ABDOMINAL HYSTERECTOMY W/ BILATERAL SALPINGOOPHORECTOMY  1973   For Fibroids  . TOTAL HIP ARTHROPLASTY  01/03/07   Left hip replacement.  Marland Kitchen VENA CAVA FILTER PLACEMENT  06/28/2010     Allergies as of 05/29/2019   No Known Allergies        Medication List             acetaminophen 500 MG tablet Commonly known as: TYLENOL Take 1,000 mg by mouth every 8 (eight) hours as needed for headache (pain).   albuterol (2.5 MG/3ML) 0.083% nebulizer solution Commonly known as: PROVENTIL Take 2.5 mg by nebulization every 6 (six) hours as needed for wheezing or shortness of breath.   albuterol 108 (90 Base) MCG/ACT inhaler Commonly known as: VENTOLIN HFA Inhale 2 puffs into the lungs every 6 (six) hours as needed for wheezing or shortness of breath.   aspirin 81 MG chewable tablet Chew 81 mg by mouth daily.   atorvastatin 20 MG tablet Commonly known as: LIPITOR Take 1 tablet (20 mg total) by mouth daily.   calcium-vitamin D 500-200 MG-UNIT Tabs tablet Commonly known as: OSCAL WITH D Take 1 tablet by mouth daily.   Dulera 100-5 MCG/ACT Aero Generic drug: mometasone-formoterol Inhale 2 puffs into the lungs 2 (two) times daily.   Ensure Take 237 mLs by mouth 2 (two) times daily.   furosemide 40 MG tablet Commonly known as: LASIX Take 40 mg by mouth daily.   isosorbide mononitrate 30 MG 24 hr tablet Commonly known as: IMDUR Take 0.5 tablets (15 mg total) by mouth daily.   loratadine 10 MG tablet Commonly  known as: CLARITIN Take 1 tablet (10 mg total) by mouth daily as needed (seasonal allergies).   metoprolol tartrate 25 MG tablet Commonly known as: LOPRESSOR Take 1.5 tablets (37.5 mg total) by mouth 2 (two) times daily.   multivitamin with minerals Tabs tablet Take 1 tablet by mouth daily.   nystatin powder Commonly known as: MYCOSTATIN/NYSTOP Apply topically daily. Apply under bilateral breasts QD   polyethylene glycol 17 g packet Commonly known as: MIRALAX / GLYCOLAX Take 17 g by mouth daily as needed.   promethazine 12.5 MG tablet Commonly known as: PHENERGAN Take 1 tablet (12.5 mg total) by mouth 3 (three) times daily as  needed for nausea or vomiting.   QUEtiapine 25 MG tablet Commonly known as: SEROQUEL Take 2 tablets (50 mg total) by mouth at bedtime.   tamsulosin 0.4 MG Caps capsule Commonly known as: FLOMAX Take 1 capsule (0.4 mg total) by mouth daily.   verapamil 120 MG 24 hr capsule Commonly known as: VERELAN PM Take 120 mg by mouth at bedtime.   warfarin 2 MG tablet Commonly known as: COUMADIN Take as directed by the anticoagulation clinic. If you are unsure how to take this medication, talk to your nurse or doctor. Original instructions: Take 2 mg by mouth daily. FOR AFIB   Potassium-20 mEq-1 tab p.o. daily          Immunization History  Administered Date(s) Administered  . Fluad Quad(high Dose 65+) 03/07/2019  . Influenza Split 03/30/2012  . Influenza Whole 04/30/2006, 04/20/2008, 04/15/2009  . Influenza, High Dose Seasonal PF 04/20/2013, 03/27/2015, 03/31/2016, 04/22/2017, 04/20/2018  . Influenza,inj,Quad PF,6+ Mos 05/23/2014  . Pneumococcal Conjugate-13 04/24/2015  . Pneumococcal Polysaccharide-23 07/06/2009  . Td 10/30/2016    Social History        Tobacco Use  . Smoking status: Passive Smoke Exposure - Never Smoker  . Smokeless tobacco: Never Used  . Tobacco comment: husband smoked in home.   Substance Use Topics  . Alcohol use: No    Alcohol/week: 0.0 standard drinks    Review of Systems  General she is not complaining of any fever or chills.  Skin does not complain of rashes or itching.  Head ears eyes nose mouth and throat does not complain of visual changes or sore throat.  Respiratory is not complaining of being short of breath or having cough.  Cardiac does not complain of chest pain has some right lower extremity edema appears to be baseline.  GU is not complaining of dysuria.  GI does not complain of abdominal discomfort nausea vomiting diarrhea constipation she tells me she thinks she eats pretty well.  Musculoskeletal is not  complaining of joint pain currently.  Neurologic is not complaining of dizziness headache syncope does have some weakness.  Psych does not complain of being depressed or anxious appears to be in good spirits today at times does have periods of increased confusion.  Physical exam.  Temperature is 97.0 pulse 71 respiration 17 blood pressure 133/75-weight is 167 pounds which appears comparable with what that was the first week of November.  In general this is a pleasant elderly female in no distress lying comfortably in bed.  Her skin is warm and dry.  Eyes visual acuity appears grossly intact sclera and conjunctive are clear.  Oropharynx appears clear mucous membranes fairly moist.  Chest is clear to auscultation with a very minimal amount of expiratory wheezing which is not really new.  Heart is irregular irregular rate and rhythm without murmur gallop  or rub she does have some chronic right lower extremity edema which appears relatively unchanged.  Abdomen is soft nontender with positive bowel sounds.  Musculoskeletal moves all her extremities x4 at baseline.  Neurologic appears grossly intact her speech is clear cannot really appreciate lateralizing findings.  Psych she is pleasant and appropriate has some cognitive impairment but appears to be at baseline and is quite cooperative with exam.  Labs.  May 31, 2019.  Sodium 151 potassium 4.6 BUN 26.8 creatinine 0.99 CO2 level is 26.  May 18, 2019.  WBC 4.6 hemoglobin 12.2 platelets 84,000.  Assessment and plan.   #1 hypernatremia-well hold her Lasix for 4 days and then restart at 20 mg a day also will hold her potassium and restart this at 10 mEq a day on Monday, November 30 as well.  We will update a metabolic panel on Monday, November 30.-She does have a history of chronic kidney disease  Clinically she appears to be doing well but it appears when she gets on Lasix at times she will develop hypernatremia at  this point will monitor.  Also notify provider of weight gain greater than 3 pounds it appears her weights been relatively stable during the month of November.  2.  History of CHF as noted above her edema appears to be at baseline weight appears relatively stable-at this point will need monitoring since we are holding her Lasix temporarily-previously appears to have tolerated holding her Lasix pretty well.  3.  Atrial fibrillation she is on Coumadin adjustments were made recently by Dr. [name] currently on 2 mg every day except 2.5 mg on Monday Wednesdays and Fridays and update INR is pending  Rate appears to be controlled on her Lopressor 37.5 mg twice daily and verapamil 120 mg a day.  4.  Recent history of depressed CO2 level this has normalized and has shown stability.  5.  History of altered mental status at one point she had increased confusion this appears to have resolved she is back at her baseline.  [id number]

## 2019-06-01 ENCOUNTER — Encounter: Payer: Self-pay | Admitting: Internal Medicine

## 2019-06-02 NOTE — Progress Notes (Signed)
This encounter was created in error - please disregard.

## 2019-06-03 ENCOUNTER — Encounter: Payer: Self-pay | Admitting: Internal Medicine

## 2019-06-05 ENCOUNTER — Non-Acute Institutional Stay (SKILLED_NURSING_FACILITY): Payer: Medicare Other | Admitting: Internal Medicine

## 2019-06-05 DIAGNOSIS — Z7901 Long term (current) use of anticoagulants: Secondary | ICD-10-CM | POA: Diagnosis not present

## 2019-06-05 DIAGNOSIS — I480 Paroxysmal atrial fibrillation: Secondary | ICD-10-CM | POA: Diagnosis not present

## 2019-06-05 DIAGNOSIS — D649 Anemia, unspecified: Secondary | ICD-10-CM | POA: Diagnosis not present

## 2019-06-05 DIAGNOSIS — I10 Essential (primary) hypertension: Secondary | ICD-10-CM | POA: Diagnosis not present

## 2019-06-06 ENCOUNTER — Non-Acute Institutional Stay (SKILLED_NURSING_FACILITY): Payer: Medicare Other | Admitting: Internal Medicine

## 2019-06-06 ENCOUNTER — Encounter: Payer: Self-pay | Admitting: Internal Medicine

## 2019-06-06 DIAGNOSIS — E870 Hyperosmolality and hypernatremia: Secondary | ICD-10-CM | POA: Diagnosis not present

## 2019-06-06 NOTE — Progress Notes (Signed)
Location:   [facility] Service:   SNF Provider: [name] MD  Colon Branch, MD  Patient Care Team: Colon Branch, MD as PCP - General (Internal Medicine)  Extended Emergency Contact Information Primary Emergency Contact: [facility] Address: [address]          [place], [place] [address] [name] of [place] Phone: [phone] Mobile Phone: [phone] Relation: Daughter    Allergies: Patient has no known allergies.  Chief Complaint  Patient presents with  . Acute Visit    HPI: Patient is 85 y.o. female who is being seen because a routine lab returned with a sodium of 151.  Patient has not been symptomatic, which is good and she is also noted to drink fairly well.  Past Medical History:  Diagnosis Date  . Acute encephalopathy   . Acute renal failure superimposed on stage 3 chronic kidney disease ([place])   . Anemia   . Atrial fibrillation ([place])   . Colon cancer ([place]) 12/11  . Colon cancer ([place]) 2011  . Diarrhea   . DVT (deep venous thrombosis) ([place]) 11/06  . Elevated brain natriuretic peptide (BNP) level   . Frequent headaches   . Heart murmur   . Hyperkalemia   . Hypertension   . Pulmonary embolism ([place]) 11/06  . Seizures ([place])   . Stroke ([place]) 11/30/2014  . Urine retention 01/2019    Past Surgical History:  Procedure Laterality Date  . APPENDECTOMY  1952  . COLECTOMY  06/2010   partial, Dr. Donne Hazel complicated by LLE CVT; S/P IVC umbrella & anemia  . HIP FRACTURE SURGERY  2006   Trauma  . TOTAL ABDOMINAL HYSTERECTOMY W/ BILATERAL SALPINGOOPHORECTOMY  1973   For Fibroids  . TOTAL HIP ARTHROPLASTY  01/03/07   Left hip replacement.  Marland Kitchen VENA CAVA FILTER PLACEMENT  06/28/2010    Allergies as of 06/06/2019   No Known Allergies     Medication List       Accurate as of June 06, 2019 11:59 PM. If you have any questions, ask your nurse or doctor.        acetaminophen 500 MG tablet Commonly known as: TYLENOL Take  1,000 mg by mouth every 8 (eight) hours as needed for headache (pain).   albuterol (2.5 MG/3ML) 0.083% nebulizer solution Commonly known as: PROVENTIL Take 2.5 mg by nebulization every 6 (six) hours as needed for wheezing or shortness of breath.   albuterol 108 (90 Base) MCG/ACT inhaler Commonly known as: VENTOLIN HFA Inhale 2 puffs into the lungs every 6 (six) hours as needed for wheezing or shortness of breath.   aspirin 81 MG chewable tablet Chew 81 mg by mouth daily.   atorvastatin 20 MG tablet Commonly known as: LIPITOR Take 1 tablet (20 mg total) by mouth daily.   calcium-vitamin D 500-200 MG-UNIT Tabs tablet Commonly known as: OSCAL WITH D Take 1 tablet by mouth daily.   Dulera 100-5 MCG/ACT Aero Generic drug: mometasone-formoterol Inhale 2 puffs into the lungs 2 (two) times daily.   Ensure Take 237 mLs by mouth 2 (two) times daily.   furosemide 40 MG tablet Commonly known as: LASIX Take 40 mg by mouth daily.   isosorbide mononitrate 30 MG 24 hr tablet Commonly known as: IMDUR Take 0.5 tablets (15 mg total) by mouth daily.   loratadine 10 MG tablet Commonly known as: CLARITIN Take 1 tablet (10 mg total) by mouth daily as needed (seasonal allergies).   metoprolol  tartrate 25 MG tablet Commonly known as: LOPRESSOR Take 1.5 tablets (37.5 mg total) by mouth 2 (two) times daily.   multivitamin with minerals Tabs tablet Take 1 tablet by mouth daily.   nystatin powder Commonly known as: MYCOSTATIN/NYSTOP Apply topically daily. Apply under bilateral breasts QD   polyethylene glycol 17 g packet Commonly known as: MIRALAX / GLYCOLAX Take 17 g by mouth daily as needed.   promethazine 12.5 MG tablet Commonly known as: PHENERGAN Take 1 tablet (12.5 mg total) by mouth 3 (three) times daily as needed for nausea or vomiting.   QUEtiapine 25 MG tablet Commonly known as: SEROQUEL Take 2 tablets (50 mg total) by mouth at bedtime.   tamsulosin 0.4 MG Caps capsule  Commonly known as: FLOMAX Take 1 capsule (0.4 mg total) by mouth daily.   verapamil 120 MG 24 hr capsule Commonly known as: VERELAN PM Take 120 mg by mouth at bedtime.   warfarin 2 MG tablet Commonly known as: COUMADIN Take as directed by the anticoagulation clinic. If you are unsure how to take this medication, talk to your nurse or doctor. Original instructions: Take 2 mg by mouth daily. Take 2 mg p.o. daily       No orders of the defined types were placed in this encounter.   Immunization History  Administered Date(s) Administered  . Fluad Quad(high Dose 65+) 03/07/2019  . Influenza Split 03/30/2012  . Influenza Whole 04/30/2006, 04/20/2008, 04/15/2009  . Influenza, High Dose Seasonal PF 04/20/2013, 03/27/2015, 03/31/2016, 04/22/2017, 04/20/2018  . Influenza,inj,Quad PF,6+ Mos 05/23/2014  . Pneumococcal Conjugate-13 04/24/2015  . Pneumococcal Polysaccharide-23 07/06/2009  . Td 10/30/2016    Social History   Tobacco Use  . Smoking status: Passive Smoke Exposure - Never Smoker  . Smokeless tobacco: Never Used  . Tobacco comment: husband smoked in home.   Substance Use Topics  . Alcohol use: No    Alcohol/week: 0.0 standard drinks    Review of Systems Lives GENERAL:  no fevers, fatigue, appetite changes SKIN: No itching, rash HEENT: No complaint RESPIRATORY: No cough, wheezing, SOB CARDIAC: No chest pain, palpitations, lower extremity edema  GI: No abdominal pain, No N/V/D or constipation, No heartburn or reflux  GU: No dysuria, frequency or urgency, or incontinence  MUSCULOSKELETAL: No unrelieved bone/joint pain NEUROLOGIC: No headache, dizziness  PSYCHIATRIC: No overt anxiety or sadness  Vitals:   06/06/19 1542  BP: (!) 198/56  Pulse: 69  Resp: 16  Temp: (!) 97 F (36.1 C)   Body mass index is 28.67 kg/m. Physical Exam  GENERAL APPEARANCE: Alert, conversant, No acute distress  SKIN: No diaphoresis rash HEENT: Unremarkable RESPIRATORY: Breathing  is even, unlabored. Lung sounds are clear   CARDIOVASCULAR: Heart RRR no murmurs, rubs or gallops. No peripheral edema  GASTROINTESTINAL: Abdomen is soft, non-tender, not distended w/ normal bowel sounds.  GENITOURINARY: Bladder non tender, not distended  MUSCULOSKELETAL: No abnormal joints or musculature NEUROLOGIC: Cranial nerves 2-12 grossly intact. Moves all extremities PSYCHIATRIC: Mood and affect appropriate to situation, no behavioral issues  Patient Active Problem List   Diagnosis Date Noted  . Altered behavior 05/10/2019  . Klebsiella cystitis 04/22/2019  . Metabolic encephalopathy [id number]  . Back pain 04/08/2019  . MCI (mild cognitive impairment) 03/07/2019  . Congestive heart failure (CHF) ([place]) 01/20/2019  . CAD (coronary artery disease) 06/07/2018  . Pulmonary hypertension, primary ([place]) 06/07/2018  . Generalized weakness   . Abnormal LFTs 06/01/2018  . Hallucination 06/01/2018  . Abnormal chest x-ray 09/28/2015  .  Edema of right lower extremity 09/28/2015  . Elevated brain natriuretic peptide (BNP) level 09/17/2015  . Seizures ([place]) 09/16/2015  . Asthma 08/14/2015  . PCP NOTES >>>>>>>>>>>>>>>>>>>>>>>>>>>>>> 04/24/2015  . TIA (transient ischemic attack) 12/11/2014  . Dyslipidemia 12/11/2014  . History of CVA (cerebrovascular accident) 12/01/2014  . Thrombocytopenia ([place]) 12/01/2014  . Chronic renal insufficiency, stage II (mild) 11/12/2013  . Impingement syndrome, shoulder, left 11/09/2013  . Acquired short bowel syndrome 11/09/2013  . Annual physical exam 08/30/2013  . Hx pulmonary embolism 06/27/2013  . Warfarin anticoagulation 06/27/2013  . DDD (degenerative disc disease), lumbar 11/18/2012  . ATRIAL FIBRILLATION   07/08/2010  . COLON CANCER, HX OF 07/08/2010  . DIZZINESS 10/18/2009  . Essential hypertension 12/16/2007  . HIP PAIN, CHRONIC 12/28/2006  . ANEMIA-NOS 07/15/2006  . Personal history of venous thrombosis and embolism 07/15/2006    CMP      Component Value Date/Time   NA 145 04/14/2019 0532   NA 146 06/27/2018 0000   NA 142 06/09/2012 1509   K 3.8 04/14/2019 0532   K 3.9 06/09/2012 1509   CL 109 04/14/2019 0532   CL 108 (H) 06/09/2012 1509   CO2 24 04/14/2019 0532   CO2 26 06/09/2012 1509   GLUCOSE 103 (H) 04/14/2019 0532   GLUCOSE 103 (H) 06/09/2012 1509   BUN 26 (H) 04/14/2019 0532   BUN 47 (A) 06/27/2018 0000   BUN 17.0 06/09/2012 1509   CREATININE 1.13 (H) 04/14/2019 0532   CREATININE 1.51 (H) 07/22/2018 1624   CREATININE 1.2 (H) 06/09/2012 1509   CALCIUM 9.4 04/14/2019 0532   CALCIUM 9.5 06/09/2012 1509   PROT 6.9 04/07/2019 1530   PROT 6.9 01/13/2018 1620   PROT 7.2 06/09/2012 1509   ALBUMIN 4.7 04/07/2019 1530   ALBUMIN 4.6 01/13/2018 1620   ALBUMIN 4.2 06/09/2012 1509   AST 31 04/07/2019 1530   AST 17 06/09/2012 1509   ALT 15 04/07/2019 1530   ALT 11 06/09/2012 1509   ALKPHOS 56 04/07/2019 1530   ALKPHOS 47 06/09/2012 1509   BILITOT 1.4 (H) 04/07/2019 1530   BILITOT 1.1 01/13/2018 1620   BILITOT 0.73 06/09/2012 1509   GFRNONAA 43 (L) 04/14/2019 0532   GFRAA 50 (L) 04/14/2019 0532   Recent Labs    01/22/19 0405 01/24/19 0432 04/08/19 0021 04/11/19 0556 04/12/19 0536 04/14/19 0532  NA 142 144 144 148* 147* 145  K 3.2* 4.7 3.5 4.3 4.1 3.8  CL 105 112* 107 111 108 109  CO2 27 24 27 27 25 24   GLUCOSE 92 107* 114* 87 93 103*  BUN 24* 25* 41* 31* 29* 26*  CREATININE 1.41* 1.33* 1.96* 1.28* 1.26* 1.13*  CALCIUM 8.5* 9.4 9.2 9.0 9.3 9.4  MG 2.1 2.0 2.2  --   --   --   PHOS 3.4  --  4.0  --   --   --    Recent Labs    01/20/19 0918 03/07/19 1454 04/07/19 1530  AST 25 17 31   ALT 15 10 15   ALKPHOS 41 43 56  BILITOT 0.7 0.8 1.4*  PROT 5.9* 6.6 6.9  ALBUMIN 3.8 4.1 4.7   Recent Labs    01/20/19 0918 03/07/19 1454 04/07/19 1530 04/08/19 0021 04/12/19 0536  WBC 4.6 6.3 8.2 6.9 5.3  NEUTROABS 3.1 4.6 6.5  --   --   HGB 12.0 13.3 13.3 12.7 12.9  HCT 38.8 40.5 41.2 40.0 42.0  MCV  97.5 93.6 95.2 97.6 100.0  PLT 76*  89.0* 75* 76* 70*   No results for input(s): CHOL, LDLCALC, TRIG in the last 8760 hours.  Invalid input(s): HCL No results found for: [facility] Lab Results  Component Value Date   TSH 3.58 08/22/2018   Lab Results  Component Value Date   HGBA1C 5.6 11/13/2013   Lab Results  Component Value Date   CHOL 97 08/16/2017   HDL 37.70 (L) 08/16/2017   LDLCALC 48 08/16/2017   TRIG 56.0 08/16/2017   CHOLHDL 3 08/16/2017    Significant Diagnostic Results in last 30 days:  No results found.  Assessment and Plan  Hypernatremia-patient will need a midline; patient will be given half-normal saline at 75 cc an hour for at least 4 L.  In addition patient will be given 120 cc of water every 2 hours x8 during the day while awake daily with a daily BMP.  Will follow response to treatment.[name], MD

## 2019-06-06 NOTE — Progress Notes (Signed)
Location:   [facility] of Service:   SNF Provider: [name] MD  Colon Branch, MD  Patient Care Team: Colon Branch, MD as PCP - General (Internal Medicine)  Extended Emergency Contact Information Primary Emergency Contact: [facility] Address: [address]          [place], [place] [address] [name] of [place] Phone: [phone] Mobile Phone: [phone] Relation: Daughter    Allergies: Patient has no known allergies.  Chief Complaint  Patient presents with  . Acute Visit    HPI: Patient is 85 y.o. female who is being seen for an INR check.  Patient's INR is 1.8.  Patient is on Coumadin for atrial fibrillation  Past Medical History:  Diagnosis Date  . Acute encephalopathy   . Acute renal failure superimposed on stage 3 chronic kidney disease ([place])   . Anemia   . Atrial fibrillation ([place])   . Colon cancer ([place]) 12/11  . Colon cancer ([place]) 2011  . Diarrhea   . DVT (deep venous thrombosis) ([place]) 11/06  . Elevated brain natriuretic peptide (BNP) level   . Frequent headaches   . Heart murmur   . Hyperkalemia   . Hypertension   . Pulmonary embolism ([place]) 11/06  . Seizures ([place])   . Stroke ([place]) 11/30/2014  . Urine retention 01/2019    Past Surgical History:  Procedure Laterality Date  . APPENDECTOMY  1952  . COLECTOMY  06/2010   partial, Dr. Donne Hazel complicated by LLE CVT; S/P IVC umbrella & anemia  . HIP FRACTURE SURGERY  2006   Trauma  . TOTAL ABDOMINAL HYSTERECTOMY W/ BILATERAL SALPINGOOPHORECTOMY  1973   For Fibroids  . TOTAL HIP ARTHROPLASTY  01/03/07   Left hip replacement.  Marland Kitchen VENA CAVA FILTER PLACEMENT  06/28/2010    Allergies as of 06/05/2019   No Known Allergies     Medication List       Accurate as of June 05, 2019 11:59 PM. If you have any questions, ask your nurse or doctor.        acetaminophen 500 MG tablet Commonly known as: TYLENOL Take 1,000 mg by mouth every 8 (eight) hours as needed for  headache (pain).   albuterol (2.5 MG/3ML) 0.083% nebulizer solution Commonly known as: PROVENTIL Take 2.5 mg by nebulization every 6 (six) hours as needed for wheezing or shortness of breath.   albuterol 108 (90 Base) MCG/ACT inhaler Commonly known as: VENTOLIN HFA Inhale 2 puffs into the lungs every 6 (six) hours as needed for wheezing or shortness of breath.   aspirin 81 MG chewable tablet Chew 81 mg by mouth daily.   atorvastatin 20 MG tablet Commonly known as: LIPITOR Take 1 tablet (20 mg total) by mouth daily.   calcium-vitamin D 500-200 MG-UNIT Tabs tablet Commonly known as: OSCAL WITH D Take 1 tablet by mouth daily.   Dulera 100-5 MCG/ACT Aero Generic drug: mometasone-formoterol Inhale 2 puffs into the lungs 2 (two) times daily.   Ensure Take 237 mLs by mouth 2 (two) times daily.   furosemide 40 MG tablet Commonly known as: LASIX Take 40 mg by mouth daily.   isosorbide mononitrate 30 MG 24 hr tablet Commonly known as: IMDUR Take 0.5 tablets (15 mg total) by mouth daily.   loratadine 10 MG tablet Commonly known as: CLARITIN Take 1 tablet (10 mg total) by mouth daily as needed (seasonal allergies).   metoprolol tartrate 25 MG tablet Commonly known as: LOPRESSOR Take 1.5 tablets (  37.5 mg total) by mouth 2 (two) times daily.   multivitamin with minerals Tabs tablet Take 1 tablet by mouth daily.   nystatin powder Commonly known as: MYCOSTATIN/NYSTOP Apply topically daily. Apply under bilateral breasts QD   polyethylene glycol 17 g packet Commonly known as: MIRALAX / GLYCOLAX Take 17 g by mouth daily as needed.   promethazine 12.5 MG tablet Commonly known as: PHENERGAN Take 1 tablet (12.5 mg total) by mouth 3 (three) times daily as needed for nausea or vomiting.   QUEtiapine 25 MG tablet Commonly known as: SEROQUEL Take 2 tablets (50 mg total) by mouth at bedtime.   tamsulosin 0.4 MG Caps capsule Commonly known as: FLOMAX Take 1 capsule (0.4 mg total)  by mouth daily.   verapamil 120 MG 24 hr capsule Commonly known as: VERELAN PM Take 120 mg by mouth at bedtime.   warfarin 2 MG tablet Commonly known as: COUMADIN Take as directed by the anticoagulation clinic. If you are unsure how to take this medication, talk to your nurse or doctor. Original instructions: Take 2 mg by mouth daily. Take 2 mg p.o. Tuesday Thursday Saturday and Sunday-2.5 mg on Monday Wednesday and Fridays       No orders of the defined types were placed in this encounter.   Immunization History  Administered Date(s) Administered  . Fluad Quad(high Dose 65+) 03/07/2019  . Influenza Split 03/30/2012  . Influenza Whole 04/30/2006, 04/20/2008, 04/15/2009  . Influenza, High Dose Seasonal PF 04/20/2013, 03/27/2015, 03/31/2016, 04/22/2017, 04/20/2018  . Influenza,inj,Quad PF,6+ Mos 05/23/2014  . Pneumococcal Conjugate-13 04/24/2015  . Pneumococcal Polysaccharide-23 07/06/2009  . Td 10/30/2016    Social History   Tobacco Use  . Smoking status: Passive Smoke Exposure - Never Smoker  . Smokeless tobacco: Never Used  . Tobacco comment: husband smoked in home.   Substance Use Topics  . Alcohol use: No    Alcohol/week: 0.0 standard drinks     There were no vitals filed for this visit. There is no height or weight on file to calculate BMI.   Patient Active Problem List   Diagnosis Date Noted  . Altered behavior 05/10/2019  . Klebsiella cystitis 04/22/2019  . Metabolic encephalopathy [id number]  . Back pain 04/08/2019  . MCI (mild cognitive impairment) 03/07/2019  . Congestive heart failure (CHF) ([place]) 01/20/2019  . CAD (coronary artery disease) 06/07/2018  . Pulmonary hypertension, primary ([place]) 06/07/2018  . Generalized weakness   . Abnormal LFTs 06/01/2018  . Hallucination 06/01/2018  . Abnormal chest x-ray 09/28/2015  . Edema of right lower extremity 09/28/2015  . Elevated brain natriuretic peptide (BNP) level 09/17/2015  . Seizures ([place])  09/16/2015  . Asthma 08/14/2015  . PCP NOTES >>>>>>>>>>>>>>>>>>>>>>>>>>>>>> 04/24/2015  . TIA (transient ischemic attack) 12/11/2014  . Dyslipidemia 12/11/2014  . History of CVA (cerebrovascular accident) 12/01/2014  . Thrombocytopenia ([place]) 12/01/2014  . Chronic renal insufficiency, stage II (mild) 11/12/2013  . Impingement syndrome, shoulder, left 11/09/2013  . Acquired short bowel syndrome 11/09/2013  . Annual physical exam 08/30/2013  . Hx pulmonary embolism 06/27/2013  . Warfarin anticoagulation 06/27/2013  . DDD (degenerative disc disease), lumbar 11/18/2012  . ATRIAL FIBRILLATION   07/08/2010  . COLON CANCER, HX OF 07/08/2010  . DIZZINESS 10/18/2009  . Essential hypertension 12/16/2007  . HIP PAIN, CHRONIC 12/28/2006  . ANEMIA-NOS 07/15/2006  . Personal history of venous thrombosis and embolism 07/15/2006    CMP     Component Value Date/Time   NA 145 04/14/2019 0532   NA  146 06/27/2018   NA 142 06/09/2012 1509   K 3.8 04/14/2019 0532   K 3.9 06/09/2012 1509   CL 109 04/14/2019 0532   CL 108 (H) 06/09/2012 1509   CO2 24 04/14/2019 0532   CO2 26 06/09/2012 1509   GLUCOSE 103 (H) 04/14/2019 0532   GLUCOSE 103 (H) 06/09/2012 1509   BUN 26 (H) 04/14/2019 0532   BUN 47 (A) 06/27/2018   BUN 17.0 06/09/2012 1509   CREATININE 1.13 (H) 04/14/2019 0532   CREATININE 1.51 (H) 07/22/2018 1624   CREATININE 1.2 (H) 06/09/2012 1509   CALCIUM 9.4 04/14/2019 0532   CALCIUM 9.5 06/09/2012 1509   PROT 6.9 04/07/2019 1530   PROT 6.9 01/13/2018 1620   PROT 7.2 06/09/2012 1509   ALBUMIN 4.7 04/07/2019 1530   ALBUMIN 4.6 01/13/2018 1620   ALBUMIN 4.2 06/09/2012 1509   AST 31 04/07/2019 1530   AST 17 06/09/2012 1509   ALT 15 04/07/2019 1530   ALT 11 06/09/2012 1509   ALKPHOS 56 04/07/2019 1530   ALKPHOS 47 06/09/2012 1509   BILITOT 1.4 (H) 04/07/2019 1530   BILITOT 1.1 01/13/2018 1620   BILITOT 0.73 06/09/2012 1509   GFRNONAA 43 (L) 04/14/2019 0532   GFRAA 50 (L) 04/14/2019  0532   Recent Labs    01/22/19 0405 01/24/19 0432  04/08/19 0021  04/11/19 0556 04/12/19 0536 04/14/19 0532  NA 142 144   < > 144   < > 148* 147* 145  K 3.2* 4.7   < > 3.5   < > 4.3 4.1 3.8  CL 105 112*   < > 107   < > 111 108 109  CO2 27 24   < > 27   < > 27 25 24   GLUCOSE 92 107*   < > 114*   < > 87 93 103*  BUN 24* 25*   < > 41*   < > 31* 29* 26*  CREATININE 1.41* 1.33*   < > 1.96*   < > 1.28* 1.26* 1.13*  CALCIUM 8.5* 9.4   < > 9.2   < > 9.0 9.3 9.4  MG 2.1 2.0  --  2.2  --   --   --   --   PHOS 3.4  --   --  4.0  --   --   --   --    < > = values in this interval not displayed.   Recent Labs    01/20/19 0918 03/07/19 1454 04/07/19 1530  AST 25 17 31   ALT 15 10 15   ALKPHOS 41 43 56  BILITOT 0.7 0.8 1.4*  PROT 5.9* 6.6 6.9  ALBUMIN 3.8 4.1 4.7   Recent Labs    01/20/19 0918  03/07/19 1454 04/07/19 1530 04/08/19 0021 04/12/19 0536  WBC 4.6   < > 6.3 8.2 6.9 5.3  NEUTROABS 3.1  --  4.6 6.5  --   --   HGB 12.0   < > 13.3 13.3 12.7 12.9  HCT 38.8   < > 40.5 41.2 40.0 42.0  MCV 97.5   < > 93.6 95.2 97.6 100.0  PLT 76*   < > 89.0* 75* 76* 70*   < > = values in this interval not displayed.   No results for input(s): CHOL, LDLCALC, TRIG in the last 8760 hours.  Invalid input(s): HCL No results found for: [facility] Lab Results  Component Value Date   TSH 3.58 08/22/2018   Lab Results  Component  Value Date   HGBA1C 5.6 11/13/2013   Lab Results  Component Value Date   CHOL 97 08/16/2017   HDL 37.70 (L) 08/16/2017   LDLCALC 48 08/16/2017   TRIG 56.0 08/16/2017   CHOLHDL 3 08/16/2017    Significant Diagnostic Results in last 30 days:  No results found.  Assessment and lesion  Atrial fibrillation/anticoagulated on Coumadin-patient's INR is 1.8.  Patient will get an extra 2.5 mg today for total of 5 mg of Coumadin and then 2.5 on Monday Wednesday Friday Saturday and 2 mg on Tuesday Thursday Sunday.  Recheck in 1 week  No problem-specific Assessment &  Plan notes found for this encounter.   Labs/tests ordered:    [name] , MD

## 2019-06-07 DIAGNOSIS — N183 Chronic kidney disease, stage 3 unspecified: Secondary | ICD-10-CM | POA: Diagnosis not present

## 2019-06-07 DIAGNOSIS — D649 Anemia, unspecified: Secondary | ICD-10-CM | POA: Diagnosis not present

## 2019-06-07 LAB — COMPREHENSIVE METABOLIC PANEL
Calcium: 8.7 (ref 8.7–10.7)
GFR calc Af Amer: 48.49
GFR calc non Af Amer: 41.83

## 2019-06-07 LAB — BASIC METABOLIC PANEL
BUN: 32 — AB (ref 4–21)
CO2: 21 (ref 13–22)
Chloride: 111 — AB (ref 99–108)
Creatinine: 1.2 — AB (ref 0.5–1.1)
Glucose: 79
Potassium: 5.3 (ref 3.4–5.3)
Sodium: 145 (ref 137–147)

## 2019-06-11 ENCOUNTER — Encounter: Payer: Self-pay | Admitting: Internal Medicine

## 2019-06-13 ENCOUNTER — Non-Acute Institutional Stay (SKILLED_NURSING_FACILITY): Payer: Medicare Other | Admitting: Internal Medicine

## 2019-06-13 ENCOUNTER — Encounter: Payer: Self-pay | Admitting: Internal Medicine

## 2019-06-13 DIAGNOSIS — E870 Hyperosmolality and hypernatremia: Secondary | ICD-10-CM | POA: Diagnosis not present

## 2019-06-13 DIAGNOSIS — Z7901 Long term (current) use of anticoagulants: Secondary | ICD-10-CM

## 2019-06-13 DIAGNOSIS — Z8679 Personal history of other diseases of the circulatory system: Secondary | ICD-10-CM | POA: Diagnosis not present

## 2019-06-13 DIAGNOSIS — I4811 Longstanding persistent atrial fibrillation: Secondary | ICD-10-CM | POA: Diagnosis not present

## 2019-06-13 NOTE — Progress Notes (Signed)
This is an acute visit.  Level care skilled.  [facility]ility [facility]  Chief complaint acute visit follow-up anticoagulation with history of atrial fibrillation and pulmonary embolism on chronic Coumadin--.  Also follow-up hypernatremia.  History of present illness.  Patient is a very pleasant 85 year old female with a history of weakness as well as intermittent hypernatremia with lower extremity edema as well as atrial fibrillation and pulmonary embolism on chronic Coumadin.  She also has a history of chronic kidney disease hypertension hyperlipidemia  She was recently seen for sodium of 151 and did receive fluids actually did receive IV fluids with subsequent visit by Dr. [name] it appears.  Her sodium did come down from 151 down to 145 on lab done on December 2.  Her weight appears to be relatively stable in the higher 160s.  Edema appears to be relatively at baseline.  She is on Coumadin with a history of atrial fibrillation as well as pulmonary embolism in the past-INR is somewhat elevated today at 3.3-it was 1.8 on November 30.  She is on 5 mg Monday Wednesday Friday and Saturday and 2 mg all other days.  There has been no evidence of increased bruising or bleeding.  She does have some bruising by her IV site but she tends to have this when she has any invasive procedures done to her arm.  Currently she is lying in bed comfortably does not have any acute complaints-says her breathing has not been that great since she came here but says it is not really changed much over the last few weeks.  I do note she is on numerous medications including Dulera as well as albuterol as needed-with a listed history of asthma   Past Medical History:  Diagnosis Date  . Acute encephalopathy   . Acute renal failure superimposed on stage 3 chronic kidney disease ([place])   . Anemia   . Atrial fibrillation ([place])   . Colon cancer ([place]) 12/11  . Colon cancer ([place]) 2011  . Diarrhea    . DVT (deep venous thrombosis) ([place]) 11/06  . Elevated brain natriuretic peptide (BNP) level   . Frequent headaches   . Heart murmur   . Hyperkalemia   . Hypertension   . Pulmonary embolism ([place]) 11/06  . Seizures ([place])   . Stroke ([place]) 11/30/2014  . Urine retention 01/2019         Past Surgical History:  Procedure Laterality Date  . APPENDECTOMY  1952  . COLECTOMY  06/2010   partial, Dr. Donne Hazel complicated by LLE CVT; S/P IVC umbrella & anemia  . HIP FRACTURE SURGERY  2006   Trauma  . TOTAL ABDOMINAL HYSTERECTOMY W/ BILATERAL SALPINGOOPHORECTOMY  1973   For Fibroids  . TOTAL HIP ARTHROPLASTY  01/03/07   Left hip replacement.  Marland Kitchen VENA CAVA FILTER PLACEMENT  06/28/2010    Allergies as of 05/29/2019  No Known Allergies             Medication List                  acetaminophen500 MG tablet Commonly known as: TYLENOL Take 1,000 mg by mouth every 8 (eight) hours as needed for headache (pain).    albuterol(2.5 MG/3ML) 0.083% nebulizer solution Commonly known as: PROVENTIL Take 2.5 mg by nebulization every 6 (six) hours as needed for wheezing or shortness of breath.    albuterol108 (90 Base) MCG/ACT inhaler Commonly known as: VENTOLIN HFA Inhale 2 puffs into the lungs every 6 (  six) hours as needed for wheezing or shortness of breath.    aspirin81 MG chewable tablet Chew 81 mg by mouth daily.    atorvastatin20 MG tablet Commonly known as: LIPITOR Take 1 tablet (20 mg total) by mouth daily.    calcium-vitamin D500-200 MG-UNIT Tabs tablet Commonly known as: OSCAL WITH D Take 1 tablet by mouth daily.    Dulera100-5 MCG/ACT Aero Generic drug: mometasone-formoterol Inhale 2 puffs into the lungs 2 (two) times daily.    Ensure Take 237 mLs by mouth 2 (two) times daily.    furosemide20 MG tablet Commonly known as: LASIX Take 20 mg by mouth daily.    isosorbide mononitrate30 MG 24 hr tablet Commonly  known as: IMDUR Take 0.5 tablets (15 mg total) by mouth daily.    loratadine10 MG tablet Commonly known as: CLARITIN Take 1 tablet (10 mg total) by mouth daily as needed (seasonal allergies).    metoprolol tartrate25 MG tablet Commonly known as: LOPRESSOR Take 1.5 tablets (37.5 mg total) by mouth 2 (two) times daily.    multivitamin with mineralsTabs tablet Take 1 tablet by mouth daily.    nystatinpowder Commonly known as: MYCOSTATIN/NYSTOP Apply topically daily. Apply under bilateral breasts QD    polyethylene glycol17 g packet Commonly known as: MIRALAX / GLYCOLAX Take 17 g by mouth daily as needed.    promethazine12.5 MG tablet Commonly known as: PHENERGAN Take 1 tablet (12.5 mg total) by mouth 3 (three) times daily as needed for nausea or vomiting.    QUEtiapine25 MG tablet Commonly known as: SEROQUEL Take 2 tablets (50 mg total) by mouth at bedtime.    tamsulosin0.4 MG Caps capsule Commonly known as: FLOMAX Take 1 capsule (0.4 mg total) by mouth daily.    verapamil120 MG 24 hr capsule Commonly known as: VERELAN PM Take 120 mg by mouth at bedtime.     Coumadin currently on 5 mg Monday Wednesday Friday and Saturday and 2 mg Tuesday Thursdays and Sundays    Potassium-10 mEq-1 tab p.o. daily           Immunization History  Administered Date(s) Administered  . Fluad Quad(high Dose 65+) 03/07/2019  . Influenza Split 03/30/2012  . Influenza Whole 04/30/2006, 04/20/2008, 04/15/2009  . Influenza, High Dose Seasonal PF 04/20/2013, 03/27/2015, 03/31/2016, 04/22/2017, 04/20/2018  . Influenza,inj,Quad PF,6+ Mos 05/23/2014  . Pneumococcal Conjugate-13 04/24/2015  . Pneumococcal Polysaccharide-23 07/06/2009  . Td 10/30/2016    Social History        Tobacco Use  . Smoking status: Passive Smoke Exposure - Never Smoker  . Smokeless tobacco: Never Used  . Tobacco comment: husband smoked in home.   Substance Use Topics  . Alcohol  use: No    Alcohol/week: 0.0 standard drinks   --- Review of systems.  General is not complaining of any fever or chills.  Skin does not complain of rashes or itching or increased bruising due to does have some mild bruising around IV site.  Head ears eyes nose mouth and throat is not complain of visual changes or nasal discharge or sore throat.  Respiratory does not really complain of shortness of breath says her breathing has not been great since she has come here but has not really changed over the past few weeks.  Is not complain of increased cough from baseline.  Cardiac is not complaining of chest pain or increased edema from baseline.  GI is not complaining of abdominal discomfort nausea vomiting diarrhea constipation.  GU does not complain of dysuria.  Musculoskeletal has some weakness of her lower extremities but is not complaining of pain currently.  Neurologic is positive for weakness does not complain of headache dizziness syncope.  And psych does not complain of being depressed or anxious today   Physical exam.  Temperature is 97.6 pulse 64 respirations 18 blood pressure 145/76 weight appears to be stable at 167.3.  In general this is a pleasant elderly female in no distress lying comfortably in bed.  Her skin is warm and dry she does have some violaceous bruising around IV site upper left arm.  Eyes visual acuity grossly intact sclera and conjunctive are clear.  Oropharynx clear mucous membranes moist she has numerous extractions.  Chest is largely clear to auscultation there are some bronchial sounds posterior lung fields no labored breathing.  Heart is irregular irregular rate and rhythm without murmur gallop or rub she has some mild right lower extremity edema compared to left which is not new.  Abdomen is soft nontender with positive bowel sounds.  Musculoskeletal moves all her extremities x4 at baseline somewhat limited exam since she is in  bed.  Neurologic is grossly intact her speech is clear did not appreciate lateralizing findings.  Psych she is pleasant and appropriate has some cognitive deficits but is at baseline.  Labs.   June 07, 2019.  Sodium 145 potassium 5.3 BUN 31.8 creatinine 1.15.  May 31, 2019.  Sodium 151 potassium 4.6 BUN 26.8 creatinine 0.99.  May 18, 2019.  WBC 4.6 hemoglobin 12.2 platelets 84,000  Assessment plan.  1.-Anticoagulation management with history of A. fib and pulmonary embolism in the past-appears her INR has gone up fairly significantly since last week now 3.3 we will hold her Coumadin tonight and recheck this tomorrow she has a small amount of bruising of IV site but this is not really new but will have to be monitored.  2.  Atrial fibrillation this appears rate controlled on Lopressor 37.5 mg twice daily and verapamil 120 mg a day again on Coumadin for anticoagulation.  3.  History of hypernatremia it appears she responded well to IV fluids I do not on recent lab her potassium was slightly elevated at 5.3 will have this rechecked tomorrow and hold her potassium in the meantime.  4.  History of lower extremity edema--CHF-- her weight and edema appears to be at baseline I note she is on Lasix 20 mg a day as well as potassium 10 mEq a day again will hold the potassium until we get an updated lab.  Continue to monitor.   [id number]

## 2019-06-14 ENCOUNTER — Encounter: Payer: Self-pay | Admitting: Internal Medicine

## 2019-06-14 ENCOUNTER — Non-Acute Institutional Stay (SKILLED_NURSING_FACILITY): Payer: Medicare Other | Admitting: Internal Medicine

## 2019-06-14 DIAGNOSIS — Z7901 Long term (current) use of anticoagulants: Secondary | ICD-10-CM | POA: Diagnosis not present

## 2019-06-14 DIAGNOSIS — Z5181 Encounter for therapeutic drug level monitoring: Secondary | ICD-10-CM

## 2019-06-14 DIAGNOSIS — R600 Localized edema: Secondary | ICD-10-CM | POA: Diagnosis not present

## 2019-06-14 DIAGNOSIS — E870 Hyperosmolality and hypernatremia: Secondary | ICD-10-CM | POA: Diagnosis not present

## 2019-06-14 LAB — BASIC METABOLIC PANEL
BUN: 19 (ref 4–21)
CO2: 22 (ref 13–22)
Chloride: 114 — AB (ref 99–108)
Creatinine: 1.1 (ref 0.5–1.1)
Glucose: 98
Potassium: 4.4 (ref 3.4–5.3)
Sodium: 150 — AB (ref 137–147)

## 2019-06-14 LAB — COMPREHENSIVE METABOLIC PANEL
Calcium: 9 (ref 8.7–10.7)
GFR calc Af Amer: 53.5
GFR calc non Af Amer: 46.16

## 2019-06-14 NOTE — Progress Notes (Signed)
This is an acute visit.  Level of care skilled.  [facility]ility [facility].  Chief complaint acute visit secondary to hypernatremia. Also follow-up INR on chronic Coumadin with history of atrial fibrillation and pulmonary embolism.  History of present illness.  Patient is a very pleasant 85 year old female who it appears will be a long-term resident of the facility.  She has a history of weakness as well as intermittent hypernatremia and lower extremity edema as well as atrial fibrillation and pulmonary embolism she is on chronic Coumadin.  Also has a history of chronic kidney disease in addition to hypertension and hyperlipidemia.  She was seen yesterday for a somewhat elevated INR of 3.3 her Coumadin was held and it is 2.8 today.  It appears she had been on 5 mg a day 4 days a week and 2 mg all other days.  In regards to hyponatremia we did updated labs which shows her sodium is back up again at 150.  She had been around that level earlier and did get IV fluids for a while and it did come down to 145.  Previous lab her potassium actually was somewhat high at 5.3 but this is normalized at 4.4 today.  Potassium was DC'd yesterday.  Currently she is resting in bed comfortably has no complaints says she is actually feeling somewhat better than yesterday.  Vital signs appear to be stable.  Nursing staff has been encouraging fluids strongly but at times it appears she still needs IV fluids to normalize her sodium.  Past Medical History:  Diagnosis Date  . Acute encephalopathy   . Acute renal failure superimposed on stage 3 chronic kidney disease ([place])   . Anemia   . Atrial fibrillation ([place])   . Colon cancer ([place]) 12/11  . Colon cancer ([place]) 2011  . Diarrhea   . DVT (deep venous thrombosis) ([place]) 11/06  . Elevated brain natriuretic peptide (BNP) level   . Frequent headaches   . Heart murmur   . Hyperkalemia   . Hypertension   . Pulmonary embolism ([place]) 11/06   . Seizures ([place])   . Stroke ([place]) 11/30/2014  . Urine retention 01/2019         Past Surgical History:  Procedure Laterality Date  . APPENDECTOMY  1952  . COLECTOMY  06/2010   partial, Dr. Donne Hazel complicated by LLE CVT; S/P IVC umbrella & anemia  . HIP FRACTURE SURGERY  2006   Trauma  . TOTAL ABDOMINAL HYSTERECTOMY W/ BILATERAL SALPINGOOPHORECTOMY  1973   For Fibroids  . TOTAL HIP ARTHROPLASTY  01/03/07   Left hip replacement.  Marland Kitchen VENA CAVA FILTER PLACEMENT  06/28/2010    Allergies as of 05/29/2019  No Known Allergies             Medication List                  acetaminophen500 MG tablet Commonly known as: TYLENOL Take 1,000 mg by mouth every 8 (eight) hours as needed for headache (pain).    albuterol(2.5 MG/3ML) 0.083% nebulizer solution Commonly known as: PROVENTIL Take 2.5 mg by nebulization every 6 (six) hours as needed for wheezing or shortness of breath.    albuterol108 (90 Base) MCG/ACT inhaler Commonly known as: VENTOLIN HFA Inhale 2 puffs into the lungs every 6 (six) hours as needed for wheezing or shortness of breath.    aspirin81 MG chewable tablet Chew 81 mg by mouth daily.    atorvastatin20 MG tablet Commonly known as: LIPITOR  Take 1 tablet (20 mg total) by mouth daily.    calcium-vitamin D500-200 MG-UNIT Tabs tablet Commonly known as: OSCAL WITH D Take 1 tablet by mouth daily.    Dulera100-5 MCG/ACT Aero Generic drug: mometasone-formoterol Inhale 2 puffs into the lungs 2 (two) times daily.    Ensure Take 237 mLs by mouth 2 (two) times daily.    furosemide20 MG tablet Commonly known as: LASIX Take 20 mg by mouth daily.    isosorbide mononitrate30 MG 24 hr tablet Commonly known as: IMDUR Take 0.5 tablets (15 mg total) by mouth daily.    loratadine10 MG tablet Commonly known as: CLARITIN Take 1 tablet (10 mg total) by mouth daily as needed (seasonal  allergies).    metoprolol tartrate25 MG tablet Commonly known as: LOPRESSOR Take 1.5 tablets (37.5 mg total) by mouth 2 (two) times daily.    multivitamin with mineralsTabs tablet Take 1 tablet by mouth daily.    nystatinpowder Commonly known as: MYCOSTATIN/NYSTOP Apply topically daily. Apply under bilateral breasts QD    polyethylene glycol17 g packet Commonly known as: MIRALAX / GLYCOLAX Take 17 g by mouth daily as needed.    promethazine12.5 MG tablet Commonly known as: PHENERGAN Take 1 tablet (12.5 mg total) by mouth 3 (three) times daily as needed for nausea or vomiting.    QUEtiapine25 MG tablet Commonly known as: SEROQUEL Take 2 tablets (50 mg total) by mouth at bedtime.    tamsulosin0.4 MG Caps capsule Commonly known as: FLOMAX Take 1 capsule (0.4 mg total) by mouth daily.    verapamil120 MG 24 hr capsule Commonly known as: VERELAN PM Take 120 mg by mouth at bedtime.     Coumadin currently on 5 mg Monday Wednesday Friday and Saturday and 2 mg Tuesday Thursdays and Sundays    Potassium-10 mEq-1 tab p.o. daily           Immunization History  Administered Date(s) Administered  . Fluad Quad(high Dose 65+) 03/07/2019  . Influenza Split 03/30/2012  . Influenza Whole 04/30/2006, 04/20/2008, 04/15/2009  . Influenza, High Dose Seasonal PF 04/20/2013, 03/27/2015, 03/31/2016, 04/22/2017, 04/20/2018  . Influenza,inj,Quad PF,6+ Mos 05/23/2014  . Pneumococcal Conjugate-13 04/24/2015  . Pneumococcal Polysaccharide-23 07/06/2009  . Td 10/30/2016    Social History        Tobacco Use  . Smoking status: Passive Smoke Exposure - Never Smoker  . Smokeless tobacco: Never Used  . Tobacco comment: husband smoked in home.   Substance Use Topics  . Alcohol use: No    Alcohol/week: 0.0 standard drinks   --- Review of systems.  General she is not complaining of any fever or chills.  Skin not complaining of rashes or  itching she has some mild bruising at her IV site which does not appear to be new.  Head ears eyes nose mouth and throat no complaints of visual changes or sore throat.  Respiratory not complaining of shortness of breath or cough-she does have some history of asthma but says her breathing actually feels better than it has in a while.  Cardiac does not complain of chest pain has some mild right lower extremity edema which appears baseline.  GI does not complain of abdominal discomfort nausea vomiting diarrhea constipation.  GU does not complain of dysuria.  Musculoskeletal has some lower extremity weakness but does not complain of pain at this time.  Neurologic positive for weakness she is not complaining of syncope headache or dizziness or tremors.  And psych continues to be in good spirits  today does not complain of being anxious or depressed.  Physical exam.  Temperature is 97.9 pulse 60 respirations 18 blood pressure 121/67.  In general this is a very pleasant elderly female no distress lying comfortably in bed she is bright and alert.  Her skin is warm and dry again she does have IV in her upper left arm with some violaceous bruising.  Eyes visual acuity appears grossly intact sclera and conjunctive are clear.  Oropharynx clear mucous membranes moist.  Chest is clear to auscultation there is no labored breathing has somewhat shallow air entry but this is not new.  Heart is irregular irregular rate and rhythm without murmur gallop or rub she has mild right lower extremity edema which is baseline does not really have significant left lower extremity edema.  Abdomen is soft nontenderWith positive bowel sounds.  Musculoskeletal Limited exam since she is in bed but she is able to move all extremities x4 at baseline with lower extremity weakness.  Neurologic is grossly intact her speech is clear cannot really appreciate lateralizing findings.  Psych she is pleasant and  appropriate continues with some mild cognitive deficits but follows simple verbal commands without difficulty and can give short answers to fairly straightforward questions   Labs.  June 14, 2019.  Sodium 140 potassium 4.4 BUN 19.3 creatinine 1.06 CO2 level was 22.  June 07, 2019.  Sodium 145 potassium 5.3 BUN 31.8 creatinine 1.15.  May 31, 2019.  Sodium 151 potassium 4.6 BUN 26.8 creatinine 0.99.  May 18, 2019.  WBC 4.6 hemoglobin 12.2 platelets 84,000   Assessment and plan.  1.  Hypernatremia which appears to be somewhat recurrent-staff is encouraging fluid strongly nonetheless her sodium at times will jump up a bit-will restart IV half-normal saline at 75 cc an hour will give 2 L and have a BMP checked on Friday, December 10-she may need further fluids but would like to be judicious with her history of CHF and lower extremity edema.  She is on Lasix but will hold this for the time being.  2.  Anticoagulation management with history of A. fib and pulmonary embolism in the past-INR is now in therapeutic range on the higher end however will restart Coumadin at 2 mg a day and recheck an INR in 2 to 3 days.  She continues on Lopressor and verapamil for rate control     3.History of lower extremity edema-CHF-at this point will hold the Lasix secondary to elevated sodium and await updated lab results and monitor clinically her edema appears to be baseline and weights appear to be stable.--She says her breathing actually is better today than it has been in a while  [phone]

## 2019-06-15 LAB — BASIC METABOLIC PANEL
BUN: 23 — AB (ref 4–21)
CO2: 19 (ref 13–22)
Chloride: 112 — AB (ref 99–108)
Creatinine: 1.1 (ref 0.5–1.1)
Glucose: 95
Potassium: 5 (ref 3.4–5.3)
Sodium: 147 (ref 137–147)

## 2019-06-15 LAB — COMPREHENSIVE METABOLIC PANEL
GFR calc Af Amer: 48.99
GFR calc non Af Amer: 42.27

## 2019-06-16 ENCOUNTER — Encounter: Payer: Self-pay | Admitting: Internal Medicine

## 2019-06-16 ENCOUNTER — Non-Acute Institutional Stay (SKILLED_NURSING_FACILITY): Payer: Medicare Other | Admitting: Internal Medicine

## 2019-06-16 DIAGNOSIS — I480 Paroxysmal atrial fibrillation: Secondary | ICD-10-CM

## 2019-06-16 DIAGNOSIS — Z7901 Long term (current) use of anticoagulants: Secondary | ICD-10-CM

## 2019-06-16 NOTE — Progress Notes (Signed)
Location:  [facility] Room Number: 212-D Place of Service:  SNF (31)  Paz, [name], MD  Patient Care Team: Colon Branch, MD as PCP - General (Internal Medicine)  Extended Emergency Contact Information Primary Emergency Contact: [name],[name] Address: [address]          [place], [place] [address] [name] of [place] Phone: [phone] Mobile Phone: [phone] Relation: Daughter    Allergies: Patient has no known allergies.  Chief Complaint  Patient presents with  . Anticoagulation    INR check     HPI: Patient is a 85 y.o. female who is being seen for atrial fibrillation and INR check.  Today patient's INR is 4.0.  Patient has no complaints  Past Medical History:  Diagnosis Date  . Acute encephalopathy   . Acute renal failure superimposed on stage 3 chronic kidney disease ([place])   . Anemia   . Atrial fibrillation ([place])   . Colon cancer ([place]) 12/11  . Colon cancer ([place]) 2011  . Diarrhea   . DVT (deep venous thrombosis) ([place]) 11/06  . Elevated brain natriuretic peptide (BNP) level   . Frequent headaches   . Heart murmur   . Hyperkalemia   . Hypertension   . Pulmonary embolism ([place]) 11/06  . Seizures ([place])   . Stroke ([place]) 11/30/2014  . Urine retention 01/2019    Past Surgical History:  Procedure Laterality Date  . APPENDECTOMY  1952  . COLECTOMY  06/2010   partial, Dr. Donne Hazel complicated by LLE CVT; S/P IVC umbrella & anemia  . HIP FRACTURE SURGERY  2006   Trauma  . TOTAL ABDOMINAL HYSTERECTOMY W/ BILATERAL SALPINGOOPHORECTOMY  1973   For Fibroids  . TOTAL HIP ARTHROPLASTY  01/03/07   Left hip replacement.  Marland Kitchen VENA CAVA FILTER PLACEMENT  06/28/2010    Allergies as of 06/16/2019   No Known Allergies     Medication List       Accurate as of June 16, 2019  8:08 PM. If you have any questions, ask your nurse or doctor.        STOP taking these medications   warfarin 2 MG tablet Commonly known  as: COUMADIN Stopped by: Inocencio Homes, MD     TAKE these medications   acetaminophen 500 MG tablet Commonly known as: TYLENOL Take 1,000 mg by mouth every 8 (eight) hours as needed for headache (pain).   albuterol (2.5 MG/3ML) 0.083% nebulizer solution Commonly known as: PROVENTIL Take 2.5 mg by nebulization every 6 (six) hours as needed for wheezing or shortness of breath.   albuterol 108 (90 Base) MCG/ACT inhaler Commonly known as: VENTOLIN HFA Inhale 2 puffs into the lungs every 6 (six) hours as needed for wheezing or shortness of breath.   aspirin 81 MG chewable tablet Chew 81 mg by mouth daily.   atorvastatin 20 MG tablet Commonly known as: LIPITOR Take 1 tablet (20 mg total) by mouth daily.   calcium-vitamin D 500-200 MG-UNIT Tabs tablet Commonly known as: OSCAL WITH D Take 1 tablet by mouth daily.   Dulera 100-5 MCG/ACT Aero Generic drug: mometasone-formoterol Inhale 2 puffs into the lungs 2 (two) times daily.   Ensure Take 237 mLs by mouth 2 (two) times daily.   isosorbide mononitrate 30 MG 24 hr tablet Commonly known as: IMDUR Take 0.5 tablets (15 mg total) by mouth daily.   loratadine 10 MG tablet Commonly known as: CLARITIN Take 1 tablet (10 mg total) by mouth  daily as needed (seasonal allergies).   metoprolol tartrate 25 MG tablet Commonly known as: LOPRESSOR Take 1.5 tablets (37.5 mg total) by mouth 2 (two) times daily.   multivitamin with minerals Tabs tablet Take 1 tablet by mouth daily.   nystatin powder Commonly known as: MYCOSTATIN/NYSTOP Apply topically daily. Apply under bilateral breasts QD   polyethylene glycol 17 g packet Commonly known as: MIRALAX / GLYCOLAX Take 17 g by mouth daily as needed.   promethazine 12.5 MG tablet Commonly known as: PHENERGAN Take 1 tablet (12.5 mg total) by mouth 3 (three) times daily as needed for nausea or vomiting.   QUEtiapine 25 MG tablet Commonly known as: SEROQUEL Take 2 tablets (50 mg total)  by mouth at bedtime.   tamsulosin 0.4 MG Caps capsule Commonly known as: FLOMAX Take 1 capsule (0.4 mg total) by mouth daily.   verapamil 120 MG 24 hr capsule Commonly known as: VERELAN PM Take 120 mg by mouth at bedtime.       No orders of the defined types were placed in this encounter.   Immunization History  Administered Date(s) Administered  . Fluad Quad(high Dose 65+) 03/07/2019  . Influenza Split 03/30/2012  . Influenza Whole 04/30/2006, 04/20/2008, 04/15/2009  . Influenza, High Dose Seasonal PF 04/20/2013, 03/27/2015, 03/31/2016, 04/22/2017, 04/20/2018  . Influenza,inj,Quad PF,6+ Mos 05/23/2014  . Pneumococcal Conjugate-13 04/24/2015  . Pneumococcal Polysaccharide-23 07/06/2009  . Td 10/30/2016    Social History   Tobacco Use  . Smoking status: Passive Smoke Exposure - Never Smoker  . Smokeless tobacco: Never Used  . Tobacco comment: husband smoked in home.   Substance Use Topics  . Alcohol use: No    Alcohol/week: 0.0 standard drinks     Vitals:   06/16/19 1654  BP: 122/64  Pulse: 67  Resp: 18  Temp: (!) 97.2 F (36.2 C)  SpO2: 96%   Body mass index is 28.72 kg/m.   Patient Active Problem List   Diagnosis Date Noted  . Altered behavior 05/10/2019  . Klebsiella cystitis 04/22/2019  . Metabolic encephalopathy [id number]  . Back pain 04/08/2019  . MCI (mild cognitive impairment) 03/07/2019  . Congestive heart failure (CHF) ([place]) 01/20/2019  . CAD (coronary artery disease) 06/07/2018  . Pulmonary hypertension, primary ([place]) 06/07/2018  . Generalized weakness   . Abnormal LFTs 06/01/2018  . Hallucination 06/01/2018  . Abnormal chest x-ray 09/28/2015  . Edema of right lower extremity 09/28/2015  . Elevated brain natriuretic peptide (BNP) level 09/17/2015  . Seizures ([place]) 09/16/2015  . Asthma 08/14/2015  . PCP NOTES >>>>>>>>>>>>>>>>>>>>>>>>>>>>>> 04/24/2015  . TIA (transient ischemic attack) 12/11/2014  . Dyslipidemia 12/11/2014  . History  of CVA (cerebrovascular accident) 12/01/2014  . Thrombocytopenia ([place]) 12/01/2014  . Chronic renal insufficiency, stage II (mild) 11/12/2013  . Impingement syndrome, shoulder, left 11/09/2013  . Acquired short bowel syndrome 11/09/2013  . Annual physical exam 08/30/2013  . Hx pulmonary embolism 06/27/2013  . Warfarin anticoagulation 06/27/2013  . DDD (degenerative disc disease), lumbar 11/18/2012  . ATRIAL FIBRILLATION   07/08/2010  . COLON CANCER, HX OF 07/08/2010  . DIZZINESS 10/18/2009  . Essential hypertension 12/16/2007  . HIP PAIN, CHRONIC 12/28/2006  . ANEMIA-NOS 07/15/2006  . Personal history of venous thrombosis and embolism 07/15/2006    CMP     Component Value Date/Time   NA 145 04/14/2019 0532   NA 146 06/27/2018 0000   NA 142 06/09/2012 1509   K 3.8 04/14/2019 0532   K 3.9 06/09/2012 1509  CL 109 04/14/2019 0532   CL 108 (H) 06/09/2012 1509   CO2 24 04/14/2019 0532   CO2 26 06/09/2012 1509   GLUCOSE 103 (H) 04/14/2019 0532   GLUCOSE 103 (H) 06/09/2012 1509   BUN 26 (H) 04/14/2019 0532   BUN 47 (A) 06/27/2018 0000   BUN 17.0 06/09/2012 1509   CREATININE 1.13 (H) 04/14/2019 0532   CREATININE 1.51 (H) 07/22/2018 1624   CREATININE 1.2 (H) 06/09/2012 1509   CALCIUM 9.4 04/14/2019 0532   CALCIUM 9.5 06/09/2012 1509   PROT 6.9 04/07/2019 1530   PROT 6.9 01/13/2018 1620   PROT 7.2 06/09/2012 1509   ALBUMIN 4.7 04/07/2019 1530   ALBUMIN 4.6 01/13/2018 1620   ALBUMIN 4.2 06/09/2012 1509   AST 31 04/07/2019 1530   AST 17 06/09/2012 1509   ALT 15 04/07/2019 1530   ALT 11 06/09/2012 1509   ALKPHOS 56 04/07/2019 1530   ALKPHOS 47 06/09/2012 1509   BILITOT 1.4 (H) 04/07/2019 1530   BILITOT 1.1 01/13/2018 1620   BILITOT 0.73 06/09/2012 1509   GFRNONAA 43 (L) 04/14/2019 0532   GFRAA 50 (L) 04/14/2019 0532   Recent Labs    01/22/19 0405 01/24/19 0432 04/08/19 0021 04/11/19 0556 04/12/19 0536 04/14/19 0532  NA 142 144 144 148* 147* 145  K 3.2* 4.7 3.5 4.3  4.1 3.8  CL 105 112* 107 111 108 109  CO2 27 24 27 27 25 24   GLUCOSE 92 107* 114* 87 93 103*  BUN 24* 25* 41* 31* 29* 26*  CREATININE 1.41* 1.33* 1.96* 1.28* 1.26* 1.13*  CALCIUM 8.5* 9.4 9.2 9.0 9.3 9.4  MG 2.1 2.0 2.2  --   --   --   PHOS 3.4  --  4.0  --   --   --    Recent Labs    01/20/19 0918 03/07/19 1454 04/07/19 1530  AST 25 17 31   ALT 15 10 15   ALKPHOS 41 43 56  BILITOT 0.7 0.8 1.4*  PROT 5.9* 6.6 6.9  ALBUMIN 3.8 4.1 4.7   Recent Labs    01/20/19 0918 03/07/19 1454 04/07/19 1530 04/08/19 0021 04/12/19 0536  WBC 4.6 6.3 8.2 6.9 5.3  NEUTROABS 3.1 4.6 6.5  --   --   HGB 12.0 13.3 13.3 12.7 12.9  HCT 38.8 40.5 41.2 40.0 42.0  MCV 97.5 93.6 95.2 97.6 100.0  PLT 76* 89.0* 75* 76* 70*   No results for input(s): CHOL, LDLCALC, TRIG in the last 8760 hours.  Invalid input(s): HCL No results found for: [facility] Lab Results  Component Value Date   TSH 3.58 08/22/2018   Lab Results  Component Value Date   HGBA1C 5.6 11/13/2013   Lab Results  Component Value Date   CHOL 97 08/16/2017   HDL 37.70 (L) 08/16/2017   LDLCALC 48 08/16/2017   TRIG 56.0 08/16/2017   CHOLHDL 3 08/16/2017    Significant Diagnostic Results in last 30 days:  No results found.  Assessment and Plan  Paroxysmal atrial fibrillation/anticoagulated on Coumadin-INR today is 4.  Perform INR daily until INR is less than or equal to 2.5 then will start back with 1 mg daily.  Next INR 12/18.     [name] , MD

## 2019-06-19 ENCOUNTER — Encounter: Payer: Self-pay | Admitting: Internal Medicine

## 2019-06-19 ENCOUNTER — Non-Acute Institutional Stay (SKILLED_NURSING_FACILITY): Payer: Medicare Other | Admitting: Internal Medicine

## 2019-06-19 DIAGNOSIS — E870 Hyperosmolality and hypernatremia: Secondary | ICD-10-CM

## 2019-06-19 DIAGNOSIS — I5030 Unspecified diastolic (congestive) heart failure: Secondary | ICD-10-CM

## 2019-06-19 LAB — COMPREHENSIVE METABOLIC PANEL
Calcium: 8.9 (ref 8.7–10.7)
GFR calc Af Amer: 69.86
GFR calc non Af Amer: 60.28

## 2019-06-19 LAB — BASIC METABOLIC PANEL
BUN: 23 — AB (ref 4–21)
CO2: 21 (ref 13–22)
Chloride: 112 — AB (ref 99–108)
Creatinine: 0.9 (ref 0.5–1.1)
Glucose: 83
Potassium: 4.3 (ref 3.4–5.3)
Sodium: 144 (ref 137–147)

## 2019-06-19 NOTE — Progress Notes (Signed)
Location:  [facility] Room Number: 212-D Place of Service:  SNF [phone]) Provider:  Granville Lewis, PA-C  Patient Care Team: Colon Branch, MD as PCP - General (Internal Medicine)  Extended Emergency Contact Information Primary Emergency Contact: [name],[name] Address: [address]          [place], [place] [address] [name] of [place] Phone: [phone] Mobile Phone: [phone] Relation: Daughter  Code Status:  Full Code Goals of care: Advanced Directive information Advanced Directives 05/15/2019  Does Patient Have a Medical Advance Directive? Yes  Type of Advance Directive (No Data)  Does patient want to make changes to medical advance directive? No - Patient declined  Copy of [place] in Chart? -  Would patient like information on creating a medical advance directive? -     Chief Complaint  Patient presents with  . Acute Visit    Patient is seen for hyprenatremia (sodium 147)    HPI:  Patient is a pleasant 85 year old female-she has a history of weakness as well as intermittent hypernatremia and lower extremity edema-other diagnoses include atrial fibrillation and history of pulmonary embolism she is on chronic Coumadin.  She also has a history of chronic kidney disease as well as hypertension and hyperlipidemia.  At times she will develop some mild hypernatremia and she responds well to IV fluids-fluids also are encouraged.  Lab last week showed her sodium was up again up to 150.  Despite staff encouraging fluids-she was started on IV half-normal saline and late last week it did come down to 147 and on updated lab today is down to 144.  Creatinine shows stability at 0.88 with BUN of 22.8.  Her Lasix also is on hold-she was on this for history of lower extremity edema and CHF-despite being off the Lasix her edema appears to be stable and weight appears to be stable as well.  Currently she is resting in  bed comfortably she is bright alert at times somewhat confused but pleasantly so nursing does not report any recent issues she does not complain of any chest pain or shortness of breath-she says she is trying to drink fluids.        Past Medical History:  Diagnosis Date  . Acute encephalopathy   . Acute renal failure superimposed on stage 3 chronic kidney disease ([place])   . Anemia   . Atrial fibrillation ([place])   . Colon cancer ([place]) 12/11  . Colon cancer ([place]) 2011  . Diarrhea   . DVT (deep venous thrombosis) ([place]) 11/06  . Elevated brain natriuretic peptide (BNP) level   . Frequent headaches   . Heart murmur   . Hyperkalemia   . Hypertension   . Pulmonary embolism ([place]) 11/06  . Seizures ([place])   . Stroke ([place]) 11/30/2014  . Urine retention 01/2019   Past Surgical History:  Procedure Laterality Date  . APPENDECTOMY  1952  . COLECTOMY  06/2010   partial, Dr. Donne Hazel complicated by LLE CVT; S/P IVC umbrella & anemia  . HIP FRACTURE SURGERY  2006   Trauma  . TOTAL ABDOMINAL HYSTERECTOMY W/ BILATERAL SALPINGOOPHORECTOMY  1973   For Fibroids  . TOTAL HIP ARTHROPLASTY  01/03/07   Left hip replacement.  Marland Kitchen VENA CAVA FILTER PLACEMENT  06/28/2010    No Known Allergies  Outpatient Encounter Medications as of 06/19/2019  Medication Sig  . acetaminophen (TYLENOL) 500 MG tablet Take 1,000 mg by mouth every 8 (eight) hours as needed  for headache (pain).   Marland Kitchen albuterol (PROVENTIL) (2.5 MG/3ML) 0.083% nebulizer solution Take 2.5 mg by nebulization every 6 (six) hours as needed for wheezing or shortness of breath.  Marland Kitchen aspirin 81 MG chewable tablet Chew 81 mg by mouth daily.  Marland Kitchen atorvastatin (LIPITOR) 20 MG tablet Take 1 tablet (20 mg total) by mouth daily.  . calcium-vitamin D (OSCAL WITH D) 500-200 MG-UNIT TABS tablet Take 1 tablet by mouth daily.  . Ensure (ENSURE) Take 237 mLs by mouth 2 (two) times daily.  Marland Kitchen escitalopram (LEXAPRO) 5 MG tablet Take 5 mg by mouth daily.  . isosorbide  mononitrate (IMDUR) 30 MG 24 hr tablet Take 0.5 tablets (15 mg total) by mouth daily.  Marland Kitchen loratadine (CLARITIN) 10 MG tablet Take 1 tablet (10 mg total) by mouth daily as needed (seasonal allergies).  . metoprolol tartrate (LOPRESSOR) 25 MG tablet Take 1.5 tablets (37.5 mg total) by mouth 2 (two) times daily.  . mometasone-formoterol (DULERA) 100-5 MCG/ACT AERO Inhale 2 puffs into the lungs 2 (two) times daily.  . Multiple Vitamin (MULTIVITAMIN WITH MINERALS) TABS tablet Take 1 tablet by mouth daily.  Marland Kitchen nystatin (MYCOSTATIN/NYSTOP) powder Apply topically daily. Apply under bilateral breasts QD  . polyethylene glycol (MIRALAX / GLYCOLAX) packet Take 17 g by mouth daily as needed.  . promethazine (PHENERGAN) 12.5 MG tablet Take 1 tablet (12.5 mg total) by mouth 3 (three) times daily as needed for nausea or vomiting.  Marland Kitchen QUEtiapine (SEROQUEL) 25 MG tablet Take 2 tablets (50 mg total) by mouth at bedtime.  . tamsulosin (FLOMAX) 0.4 MG CAPS capsule Take 1 capsule (0.4 mg total) by mouth daily.  . verapamil (VERELAN PM) 120 MG 24 hr capsule Take 120 mg by mouth at bedtime.  Marland Kitchen warfarin (COUMADIN) 1 MG tablet Take 1 mg by mouth daily. Recheck INR 06/23/19  . albuterol (VENTOLIN HFA) 108 (90 Base) MCG/ACT inhaler Inhale 2 puffs into the lungs every 6 (six) hours as needed for wheezing or shortness of breath.   No facility-administered encounter medications on file as of 06/19/2019.    Review of Systems   In general she is not complaining of any fever or chills.  Skin is not complaining of rashes itching or diaphoresis she does have some bruising around her IV site which appears to be somewhat chronic.  Head ears eyes nose mouth and throat is not complain of visual changes or sore throat.  Respiratory does not complain of being short of breath or having a cough.  Cardiac does not complain of chest pain edema of her right leg appears to be baseline does not really have edema of her left leg.  GI is  not complaining of abdominal discomfort nausea vomiting diarrhea constipation-.  GU does not complain of dysuria.  Musculoskeletal does have lower extremity weakness but does not complain of pain at this time.  Neurologic does not complain of syncope headache dizziness-does have some weakness.  And psych is not complaining of being depressed or anxious she does have some cognitive decline but nursing does not report any behaviors.    Immunization History  Administered Date(s) Administered  . Fluad Quad(high Dose 65+) 03/07/2019  . Influenza Split 03/30/2012  . Influenza Whole 04/30/2006, 04/20/2008, 04/15/2009  . Influenza, High Dose Seasonal PF 04/20/2013, 03/27/2015, 03/31/2016, 04/22/2017, 04/20/2018  . Influenza,inj,Quad PF,6+ Mos 05/23/2014  . Pneumococcal Conjugate-13 04/24/2015  . Pneumococcal Polysaccharide-23 07/06/2009  . Td 10/30/2016   Pertinent  Health Maintenance Due  Topic Date Due  .  DEXA SCAN  02/27/2020 (Originally 11/26/1993)  . INFLUENZA VACCINE  Completed  . PNA vac Low Risk Adult  Completed   Fall Risk  07/22/2018 01/18/2018 10/30/2016 08/14/2015 04/24/2015  Falls in the past year? 1 No No No No  Number falls in past yr: 1 - - - -  Injury with Fall? 0 - - - -   Functional Status Survey:    Vitals:   06/19/19 1611  BP: 126/70  Pulse: (!) 55  Resp: 16  Temp: (!) 97 F (36.1 C)  TempSrc: Oral  SpO2: 96%  Weight: 168 lb 14.4 oz (76.6 kg)  Height: 5\' 3"  (1.6 m)  Body mass index is 29.92 kg/m. Physical Exam In general this is a pleasant elderly female no distress lying comfortably in bed she is bright and alert.  Her skin is warm and dry she does have some chronic bruising on the left upper arm where IV site is located.  Eyes visual acuity appears to be intact sclera and conjunctive are clear.  Oropharynx is clear mucous membranes moist.  Chest is clear to auscultation somewhat shallow air entry there is no labored breathing.  Heart is irregular  regular rate and rhythm without murmur gallop or rub she has mild right lower extremity edema which appears to be baseline.  Abdomen is soft nontender with positive bowel sounds.  Musculoskeletal she is able to move all extremities x4 at baseline with baseline lower extremity weakness.  Neurologic is grossly intact her speech is clear cannot appreciate lateralizing findings.  Psych she continues to be pleasant and appropriate follow simple verbal commands without difficulty.  Labs.  June 19, 2019.  Sodium 144 potassium 4.3 BUN 22.8 creatinine 0.85.   Labs reviewed: Recent Labs    01/22/19 0405 01/24/19 0432 04/08/19 0021 04/11/19 0556 04/12/19 0536 04/14/19 0532 05/18/19 0000 06/07/19 0000 06/14/19 0000 06/15/19 0000  NA 142 144 144 148* 147* 145 142 145 150* 147  K 3.2* 4.7 3.5 4.3 4.1 3.8 5.1 5.3 4.4 5.0  CL 105 112* 107 111 108 109 110* 111* 114* 112*  CO2 27 24 27 27 25 24 19 21 22  19  GLUCOSE 92 107* 114* 87 93 103*  --   --   --   --   BUN 24* 25* 41* 31* 29* 26* 58* 32* 19 23*  CREATININE 1.41* 1.33* 1.96* 1.28* 1.26* 1.13* 1.6* 1.2* 1.1 1.1  CALCIUM 8.5* 9.4 9.2 9.0 9.3 9.4 9.4 8.7 9.0  --   MG 2.1 2.0 2.2  --   --   --   --   --   --   --   PHOS 3.4  --  4.0  --   --   --   --   --   --   --    Recent Labs    01/20/19 0918 03/07/19 1454 04/07/19 1530  AST 25 17 31   ALT 15 10 15   ALKPHOS 41 43 56  BILITOT 0.7 0.8 1.4*  PROT 5.9* 6.6 6.9  ALBUMIN 3.8 4.1 4.7   Recent Labs    03/07/19 1454 04/07/19 1530 04/08/19 0021 04/12/19 0536 05/18/19 0000  WBC 6.3 8.2 6.9 5.3 4.6  NEUTROABS 4.6 6.5  --   --  3  HGB 13.3 13.3 12.7 12.9 12.2  HCT 40.5 41.2 40.0 42.0 36  MCV 93.6 95.2 97.6 100.0  --   PLT 89.0* 75* 76* 70* 84*   Lab Results  Component Value Date  TSH 3.58 08/22/2018   Lab Results  Component Value Date   HGBA1C 5.6 11/13/2013   Lab Results  Component Value Date   CHOL 97 08/16/2017   HDL 37.70 (L) 08/16/2017   LDLCALC 48  08/16/2017   TRIG 56.0 08/16/2017   CHOLHDL 3 08/16/2017    Significant Diagnostic Results in last 30 days:  No results found.  Assessment/Plan  #1 history of hypernatremia chronic kidney disease-she does appears to have responded well again to IV fluids sodium is back in normal range at 144 renal function appears to be stable at this point will continue to monitor and encourage fluids.  2.  History of CHF-Lasix is currently on hold nonetheless her edema and weight appears to be stable she is not complaining of any shortness of breath this will need to be watched.  Somewhat of a challenging issue with her history of hypernatremia but so far appears to have tolerated being off the Lasix.  [id number]

## 2019-06-19 NOTE — Progress Notes (Signed)
Location:  [facility] of Service:  SNF (32)  Paz, [name], MD  Patient Care Team: Colon Branch, MD as PCP - General (Internal Medicine)  Extended Emergency Contact Information Primary Emergency Contact: [facility] Address: [address]          [place], [place] [address] [name] of [place] Phone: [phone] Mobile Phone: [phone] Relation: Daughter    Allergies: Patient has no known allergies.  Chief Complaint  Patient presents with  . Acute Visit    Patient is seen for hypernatremia    HPI: Patient is 85 y.o. female who   Past Medical History:  Diagnosis Date  . Acute encephalopathy   . Acute renal failure superimposed on stage 3 chronic kidney disease ([place])   . Anemia   . Atrial fibrillation ([place])   . Colon cancer ([place]) 12/11  . Colon cancer ([place]) 2011  . Diarrhea   . DVT (deep venous thrombosis) ([place]) 11/06  . Elevated brain natriuretic peptide (BNP) level   . Frequent headaches   . Heart murmur   . Hyperkalemia   . Hypertension   . Pulmonary embolism ([place]) 11/06  . Seizures ([place])   . Stroke ([place]) 11/30/2014  . Urine retention 01/2019    Past Surgical History:  Procedure Laterality Date  . APPENDECTOMY  1952  . COLECTOMY  06/2010   partial, Dr. Donne Hazel complicated by LLE CVT; S/P IVC umbrella & anemia  . HIP FRACTURE SURGERY  2006   Trauma  . TOTAL ABDOMINAL HYSTERECTOMY W/ BILATERAL SALPINGOOPHORECTOMY  1973   For Fibroids  . TOTAL HIP ARTHROPLASTY  01/03/07   Left hip replacement.  Marland Kitchen VENA CAVA FILTER PLACEMENT  06/28/2010    Allergies as of 06/19/2019   No Known Allergies     Medication List       Accurate as of June 19, 2019  4:43 PM. If you have any questions, ask your nurse or doctor.        acetaminophen 500 MG tablet Commonly known as: TYLENOL Take 1,000 mg by mouth every 8 (eight) hours as needed for headache (pain).   albuterol (2.5 MG/3ML) 0.083% nebulizer solution  Commonly known as: PROVENTIL Take 2.5 mg by nebulization every 6 (six) hours as needed for wheezing or shortness of breath.   albuterol 108 (90 Base) MCG/ACT inhaler Commonly known as: VENTOLIN HFA Inhale 2 puffs into the lungs every 6 (six) hours as needed for wheezing or shortness of breath.   aspirin 81 MG chewable tablet Chew 81 mg by mouth daily.   atorvastatin 20 MG tablet Commonly known as: LIPITOR Take 1 tablet (20 mg total) by mouth daily.   calcium-vitamin D 500-200 MG-UNIT Tabs tablet Commonly known as: OSCAL WITH D Take 1 tablet by mouth daily.   Dulera 100-5 MCG/ACT Aero Generic drug: mometasone-formoterol Inhale 2 puffs into the lungs 2 (two) times daily.   Ensure Take 237 mLs by mouth 2 (two) times daily.   escitalopram 5 MG tablet Commonly known as: LEXAPRO Take 5 mg by mouth daily.   isosorbide mononitrate 30 MG 24 hr tablet Commonly known as: IMDUR Take 0.5 tablets (15 mg total) by mouth daily.   loratadine 10 MG tablet Commonly known as: CLARITIN Take 1 tablet (10 mg total) by mouth daily as needed (seasonal allergies).   metoprolol tartrate 25 MG tablet Commonly known as: LOPRESSOR Take 1.5 tablets (37.5 mg total) by mouth 2 (two) times daily.   multivitamin with  minerals Tabs tablet Take 1 tablet by mouth daily.   nystatin powder Commonly known as: MYCOSTATIN/NYSTOP Apply topically daily. Apply under bilateral breasts QD   polyethylene glycol 17 g packet Commonly known as: MIRALAX / GLYCOLAX Take 17 g by mouth daily as needed.   promethazine 12.5 MG tablet Commonly known as: PHENERGAN Take 1 tablet (12.5 mg total) by mouth 3 (three) times daily as needed for nausea or vomiting.   QUEtiapine 25 MG tablet Commonly known as: SEROQUEL Take 2 tablets (50 mg total) by mouth at bedtime.   tamsulosin 0.4 MG Caps capsule Commonly known as: FLOMAX Take 1 capsule (0.4 mg total) by mouth daily.   verapamil 120 MG 24 hr capsule Commonly known  as: VERELAN PM Take 120 mg by mouth at bedtime.       No orders of the defined types were placed in this encounter.   Immunization History  Administered Date(s) Administered  . Fluad Quad(high Dose 65+) 03/07/2019  . Influenza Split 03/30/2012  . Influenza Whole 04/30/2006, 04/20/2008, 04/15/2009  . Influenza, High Dose Seasonal PF 04/20/2013, 03/27/2015, 03/31/2016, 04/22/2017, 04/20/2018  . Influenza,inj,Quad PF,6+ Mos 05/23/2014  . Pneumococcal Conjugate-13 04/24/2015  . Pneumococcal Polysaccharide-23 07/06/2009  . Td 10/30/2016    Social History   Tobacco Use  . Smoking status: Passive Smoke Exposure - Never Smoker  . Smokeless tobacco: Never Used  . Tobacco comment: husband smoked in home.   Substance Use Topics  . Alcohol use: No    Alcohol/week: 0.0 standard drinks    Review of Systems  DATA OBTAINED: from patient, nurse, medical record, family member GENERAL:  no fevers, fatigue, appetite changes SKIN: No itching, rash HEENT: No complaint RESPIRATORY: No cough, wheezing, SOB CARDIAC: No chest pain, palpitations, lower extremity edema  GI: No abdominal pain, No N/V/D or constipation, No heartburn or reflux  GU: No dysuria, frequency or urgency, or incontinence  MUSCULOSKELETAL: No unrelieved bone/joint pain NEUROLOGIC: No headache, dizziness  PSYCHIATRIC: No overt anxiety or sadness  There were no vitals filed for this visit. There is no height or weight on file to calculate BMI. Physical Exam  GENERAL APPEARANCE: Alert, conversant, No acute distress  SKIN: No diaphoresis rash HEENT: Unremarkable RESPIRATORY: Breathing is even, unlabored. Lung sounds are clear   CARDIOVASCULAR: Heart RRR no murmurs, rubs or gallops. No peripheral edema  GASTROINTESTINAL: Abdomen is soft, non-tender, not distended w/ normal bowel sounds.  GENITOURINARY: Bladder non tender, not distended  MUSCULOSKELETAL: No abnormal joints or musculature NEUROLOGIC: Cranial nerves 2-12  grossly intact. Moves all extremities PSYCHIATRIC: Mood and affect appropriate to situation, no behavioral issues  Patient Active Problem List   Diagnosis Date Noted  . Altered behavior 05/10/2019  . Klebsiella cystitis 04/22/2019  . Metabolic encephalopathy [id number]  . Back pain 04/08/2019  . MCI (mild cognitive impairment) 03/07/2019  . Congestive heart failure (CHF) ([place]) 01/20/2019  . CAD (coronary artery disease) 06/07/2018  . Pulmonary hypertension, primary ([place]) 06/07/2018  . Generalized weakness   . Abnormal LFTs 06/01/2018  . Hallucination 06/01/2018  . Abnormal chest x-ray 09/28/2015  . Edema of right lower extremity 09/28/2015  . Elevated brain natriuretic peptide (BNP) level 09/17/2015  . Seizures ([place]) 09/16/2015  . Asthma 08/14/2015  . PCP NOTES >>>>>>>>>>>>>>>>>>>>>>>>>>>>>> 04/24/2015  . TIA (transient ischemic attack) 12/11/2014  . Dyslipidemia 12/11/2014  . History of CVA (cerebrovascular accident) 12/01/2014  . Thrombocytopenia ([place]) 12/01/2014  . Chronic renal insufficiency, stage II (mild) 11/12/2013  . Impingement syndrome, shoulder, left 11/09/2013  .  Acquired short bowel syndrome 11/09/2013  . Annual physical exam 08/30/2013  . Hx pulmonary embolism 06/27/2013  . Warfarin anticoagulation 06/27/2013  . DDD (degenerative disc disease), lumbar 11/18/2012  . ATRIAL FIBRILLATION   07/08/2010  . COLON CANCER, HX OF 07/08/2010  . DIZZINESS 10/18/2009  . Essential hypertension 12/16/2007  . HIP PAIN, CHRONIC 12/28/2006  . ANEMIA-NOS 07/15/2006  . Personal history of venous thrombosis and embolism 07/15/2006    CMP     Component Value Date/Time   NA 147 06/15/2019 0000   NA 142 06/09/2012 1509   K 5.0 06/15/2019 0000   K 3.9 06/09/2012 1509   CL 112 (A) 06/15/2019 0000   CL 108 (H) 06/09/2012 1509   CO2 19 06/15/2019 0000   CO2 26 06/09/2012 1509   GLUCOSE 103 (H) 04/14/2019 0532   GLUCOSE 103 (H) 06/09/2012 1509   BUN 23 (A) 06/15/2019 0000    BUN 17.0 06/09/2012 1509   CREATININE 1.1 06/15/2019 0000   CREATININE 1.13 (H) 04/14/2019 0532   CREATININE 1.51 (H) 07/22/2018 1624   CREATININE 1.2 (H) 06/09/2012 1509   CALCIUM 9.0 06/14/2019 0000   CALCIUM 9.5 06/09/2012 1509   PROT 6.9 04/07/2019 1530   PROT 6.9 01/13/2018 1620   PROT 7.2 06/09/2012 1509   ALBUMIN 4.7 04/07/2019 1530   ALBUMIN 4.6 01/13/2018 1620   ALBUMIN 4.2 06/09/2012 1509   AST 31 04/07/2019 1530   AST 17 06/09/2012 1509   ALT 15 04/07/2019 1530   ALT 11 06/09/2012 1509   ALKPHOS 56 04/07/2019 1530   ALKPHOS 47 06/09/2012 1509   BILITOT 1.4 (H) 04/07/2019 1530   BILITOT 1.1 01/13/2018 1620   BILITOT 0.73 06/09/2012 1509   GFRNONAA 42.27 06/15/2019 0000   GFRAA 48.99 06/15/2019 0000   Recent Labs    01/22/19 0405 01/24/19 0432 04/08/19 0021 04/11/19 0556 04/12/19 0536 04/14/19 0532 05/18/19 0000 06/07/19 0000 06/14/19 0000 06/15/19 0000  NA 142 144 144 148* 147* 145 142 145 150* 147  K 3.2* 4.7 3.5 4.3 4.1 3.8 5.1 5.3 4.4 5.0  CL 105 112* 107 111 108 109 110* 111* 114* 112*  CO2 27 24 27 27 25 24 19 21 22  19  GLUCOSE 92 107* 114* 87 93 103*  --   --   --   --   BUN 24* 25* 41* 31* 29* 26* 58* 32* 19 23*  CREATININE 1.41* 1.33* 1.96* 1.28* 1.26* 1.13* 1.6* 1.2* 1.1 1.1  CALCIUM 8.5* 9.4 9.2 9.0 9.3 9.4 9.4 8.7 9.0  --   MG 2.1 2.0 2.2  --   --   --   --   --   --   --   PHOS 3.4  --  4.0  --   --   --   --   --   --   --    Recent Labs    01/20/19 0918 03/07/19 1454 04/07/19 1530  AST 25 17 31   ALT 15 10 15   ALKPHOS 41 43 56  BILITOT 0.7 0.8 1.4*  PROT 5.9* 6.6 6.9  ALBUMIN 3.8 4.1 4.7   Recent Labs    03/07/19 1454 04/07/19 1530 04/08/19 0021 04/12/19 0536 05/18/19 0000  WBC 6.3 8.2 6.9 5.3 4.6  NEUTROABS 4.6 6.5  --   --  3  HGB 13.3 13.3 12.7 12.9 12.2  HCT 40.5 41.2 40.0 42.0 36  MCV 93.6 95.2 97.6 100.0  --   PLT 89.0* 75* 76* 70* 84*  No results for input(s): CHOL, LDLCALC, TRIG in the last 8760 hours.   Invalid input(s): HCL No results found for: [facility] Lab Results  Component Value Date   TSH 3.58 08/22/2018   Lab Results  Component Value Date   HGBA1C 5.6 11/13/2013   Lab Results  Component Value Date   CHOL 97 08/16/2017   HDL 37.70 (L) 08/16/2017   LDLCALC 48 08/16/2017   TRIG 56.0 08/16/2017   CHOLHDL 3 08/16/2017    Significant Diagnostic Results in last 30 days:  No results found.  Assessment and Plan  No problem-specific Assessment & Plan notes found for this encounter.   Labs/tests ordered:    [name], MD    This encounter was created in error - please disregard.

## 2019-06-20 ENCOUNTER — Encounter: Payer: Self-pay | Admitting: Internal Medicine

## 2019-06-20 ENCOUNTER — Non-Acute Institutional Stay (SKILLED_NURSING_FACILITY): Payer: Medicare Other | Admitting: Internal Medicine

## 2019-06-20 DIAGNOSIS — I480 Paroxysmal atrial fibrillation: Secondary | ICD-10-CM

## 2019-06-20 DIAGNOSIS — Z7901 Long term (current) use of anticoagulants: Secondary | ICD-10-CM

## 2019-06-20 NOTE — Progress Notes (Signed)
Location:  [facility] Room Number: 212-D Place of Service:  SNF (31)  Paz, [name], MD  Patient Care Team: Colon Branch, MD as PCP - General (Internal Medicine)  Extended Emergency Contact Information Primary Emergency Contact: [name],[name] Address: [address]          [place], [place] [address] [name] of [place] Phone: [phone] Mobile Phone: [phone] Relation: Daughter    Allergies: Patient has no known allergies.  Chief Complaint  Patient presents with  . Anticoagulation    INR management    HPI: Patient is 85 y.o. female who who has atrial fibrillation and is being seen today for INR check.  Patient's INR is 2.4 today  Past Medical History:  Diagnosis Date  . Acute encephalopathy   . Acute renal failure superimposed on stage 3 chronic kidney disease ([place])   . Anemia   . Atrial fibrillation ([place])   . Colon cancer ([place]) 12/11  . Colon cancer ([place]) 2011  . Diarrhea   . DVT (deep venous thrombosis) ([place]) 11/06  . Elevated brain natriuretic peptide (BNP) level   . Frequent headaches   . Heart murmur   . Hyperkalemia   . Hypertension   . Pulmonary embolism ([place]) 11/06  . Seizures ([place])   . Stroke ([place]) 11/30/2014  . Urine retention 01/2019    Past Surgical History:  Procedure Laterality Date  . APPENDECTOMY  1952  . COLECTOMY  06/2010   partial, Dr. Donne Hazel complicated by LLE CVT; S/P IVC umbrella & anemia  . HIP FRACTURE SURGERY  2006   Trauma  . TOTAL ABDOMINAL HYSTERECTOMY W/ BILATERAL SALPINGOOPHORECTOMY  1973   For Fibroids  . TOTAL HIP ARTHROPLASTY  01/03/07   Left hip replacement.  Marland Kitchen VENA CAVA FILTER PLACEMENT  06/28/2010    Allergies as of 06/20/2019   No Known Allergies     Medication List       Accurate as of June 20, 2019 11:59 PM. If you have any questions, ask your nurse or doctor.        acetaminophen 500 MG tablet Commonly known as: TYLENOL Take 1,000 mg by mouth  every 8 (eight) hours as needed for headache (pain).   albuterol (2.5 MG/3ML) 0.083% nebulizer solution Commonly known as: PROVENTIL Take 2.5 mg by nebulization every 6 (six) hours as needed for wheezing or shortness of breath.   albuterol 108 (90 Base) MCG/ACT inhaler Commonly known as: VENTOLIN HFA Inhale 2 puffs into the lungs every 6 (six) hours as needed for wheezing or shortness of breath.   aspirin 81 MG chewable tablet Chew 81 mg by mouth daily.   atorvastatin 20 MG tablet Commonly known as: LIPITOR Take 1 tablet (20 mg total) by mouth daily.   calcium-vitamin D 500-200 MG-UNIT Tabs tablet Commonly known as: OSCAL WITH D Take 1 tablet by mouth daily.   Dulera 100-5 MCG/ACT Aero Generic drug: mometasone-formoterol Inhale 2 puffs into the lungs 2 (two) times daily.   Ensure Take 237 mLs by mouth 2 (two) times daily.   escitalopram 5 MG tablet Commonly known as: LEXAPRO Take 5 mg by mouth daily.   isosorbide mononitrate 30 MG 24 hr tablet Commonly known as: IMDUR Take 0.5 tablets (15 mg total) by mouth daily.   loratadine 10 MG tablet Commonly known as: CLARITIN Take 1 tablet (10 mg total) by mouth daily as needed (seasonal allergies).   metoprolol tartrate 25 MG tablet Commonly known as: LOPRESSOR Take  1.5 tablets (37.5 mg total) by mouth 2 (two) times daily.   multivitamin with minerals Tabs tablet Take 1 tablet by mouth daily.   nystatin powder Commonly known as: MYCOSTATIN/NYSTOP Apply topically daily. Apply under bilateral breasts QD   polyethylene glycol 17 g packet Commonly known as: MIRALAX / GLYCOLAX Take 17 g by mouth daily as needed.   promethazine 12.5 MG tablet Commonly known as: PHENERGAN Take 1 tablet (12.5 mg total) by mouth 3 (three) times daily as needed for nausea or vomiting.   QUEtiapine 25 MG tablet Commonly known as: SEROQUEL Take 2 tablets (50 mg total) by mouth at bedtime.   tamsulosin 0.4 MG Caps capsule Commonly known as:  FLOMAX Take 1 capsule (0.4 mg total) by mouth daily.   verapamil 120 MG 24 hr capsule Commonly known as: VERELAN PM Take 120 mg by mouth at bedtime.   warfarin 1 MG tablet Commonly known as: COUMADIN Take as directed by the anticoagulation clinic. If you are unsure how to take this medication, talk to your nurse or doctor. Original instructions: Take 1 mg by mouth daily. Recheck INR 06/23/19       No orders of the defined types were placed in this encounter.   Immunization History  Administered Date(s) Administered  . Fluad Quad(high Dose 65+) 03/07/2019  . Influenza Split 03/30/2012  . Influenza Whole 04/30/2006, 04/20/2008, 04/15/2009  . Influenza, High Dose Seasonal PF 04/20/2013, 03/27/2015, 03/31/2016, 04/22/2017, 04/20/2018  . Influenza,inj,Quad PF,6+ Mos 05/23/2014  . Pneumococcal Conjugate-13 04/24/2015  . Pneumococcal Polysaccharide-23 07/06/2009  . Td 10/30/2016    Social History   Tobacco Use  . Smoking status: Passive Smoke Exposure - Never Smoker  . Smokeless tobacco: Never Used  . Tobacco comment: husband smoked in home.   Substance Use Topics  . Alcohol use: No    Alcohol/week: 0.0 standard drinks     Vitals:   06/20/19 1553  BP: 122/63  Pulse: (!) 58  Resp: 16  Temp: 97.9 F (36.6 C)  SpO2: 96%   Body mass index is 29.92 kg/m.   Patient Active Problem List   Diagnosis Date Noted  . Altered behavior 05/10/2019  . Klebsiella cystitis 04/22/2019  . Metabolic encephalopathy [id number]  . Back pain 04/08/2019  . MCI (mild cognitive impairment) 03/07/2019  . Congestive heart failure (CHF) ([place]) 01/20/2019  . CAD (coronary artery disease) 06/07/2018  . Pulmonary hypertension, primary ([place]) 06/07/2018  . Generalized weakness   . Abnormal LFTs 06/01/2018  . Hallucination 06/01/2018  . Abnormal chest x-ray 09/28/2015  . Edema of right lower extremity 09/28/2015  . Elevated brain natriuretic peptide (BNP) level 09/17/2015  . Seizures ([place])  09/16/2015  . Asthma 08/14/2015  . PCP NOTES >>>>>>>>>>>>>>>>>>>>>>>>>>>>>> 04/24/2015  . TIA (transient ischemic attack) 12/11/2014  . Dyslipidemia 12/11/2014  . History of CVA (cerebrovascular accident) 12/01/2014  . Thrombocytopenia ([place]) 12/01/2014  . Chronic renal insufficiency, stage II (mild) 11/12/2013  . Impingement syndrome, shoulder, left 11/09/2013  . Acquired short bowel syndrome 11/09/2013  . Annual physical exam 08/30/2013  . Hx pulmonary embolism 06/27/2013  . Warfarin anticoagulation 06/27/2013  . DDD (degenerative disc disease), lumbar 11/18/2012  . ATRIAL FIBRILLATION   07/08/2010  . COLON CANCER, HX OF 07/08/2010  . DIZZINESS 10/18/2009  . Essential hypertension 12/16/2007  . HIP PAIN, CHRONIC 12/28/2006  . ANEMIA-NOS 07/15/2006  . Personal history of venous thrombosis and embolism 07/15/2006    CMP     Component Value Date/Time   NA 147 06/15/2019 0000  NA 142 06/09/2012 1509   K 5.0 06/15/2019 0000   K 3.9 06/09/2012 1509   CL 112 (A) 06/15/2019 0000   CL 108 (H) 06/09/2012 1509   CO2 19 06/15/2019 0000   CO2 26 06/09/2012 1509   GLUCOSE 103 (H) 04/14/2019 0532   GLUCOSE 103 (H) 06/09/2012 1509   BUN 23 (A) 06/15/2019 0000   BUN 17.0 06/09/2012 1509   CREATININE 1.1 06/15/2019 0000   CREATININE 1.13 (H) 04/14/2019 0532   CREATININE 1.51 (H) 07/22/2018 1624   CREATININE 1.2 (H) 06/09/2012 1509   CALCIUM 9.0 06/14/2019 0000   CALCIUM 9.5 06/09/2012 1509   PROT 6.9 04/07/2019 1530   PROT 6.9 01/13/2018 1620   PROT 7.2 06/09/2012 1509   ALBUMIN 4.7 04/07/2019 1530   ALBUMIN 4.6 01/13/2018 1620   ALBUMIN 4.2 06/09/2012 1509   AST 31 04/07/2019 1530   AST 17 06/09/2012 1509   ALT 15 04/07/2019 1530   ALT 11 06/09/2012 1509   ALKPHOS 56 04/07/2019 1530   ALKPHOS 47 06/09/2012 1509   BILITOT 1.4 (H) 04/07/2019 1530   BILITOT 1.1 01/13/2018 1620   BILITOT 0.73 06/09/2012 1509   GFRNONAA 42.27 06/15/2019 0000   GFRAA 48.99 06/15/2019 0000    Recent Labs    01/22/19 0405 01/24/19 0432 04/08/19 0021 04/11/19 0556 04/12/19 0536 04/14/19 0532 05/18/19 0000 06/07/19 0000 06/14/19 0000 06/15/19 0000  NA 142 144 144 148* 147* 145 142 145 150* 147  K 3.2* 4.7 3.5 4.3 4.1 3.8 5.1 5.3 4.4 5.0  CL 105 112* 107 111 108 109 110* 111* 114* 112*  CO2 27 24 27 27 25 24 19 21 22  19  GLUCOSE 92 107* 114* 87 93 103*  --   --   --   --   BUN 24* 25* 41* 31* 29* 26* 58* 32* 19 23*  CREATININE 1.41* 1.33* 1.96* 1.28* 1.26* 1.13* 1.6* 1.2* 1.1 1.1  CALCIUM 8.5* 9.4 9.2 9.0 9.3 9.4 9.4 8.7 9.0  --   MG 2.1 2.0 2.2  --   --   --   --   --   --   --   PHOS 3.4  --  4.0  --   --   --   --   --   --   --    Recent Labs    01/20/19 0918 03/07/19 1454 04/07/19 1530  AST 25 17 31   ALT 15 10 15   ALKPHOS 41 43 56  BILITOT 0.7 0.8 1.4*  PROT 5.9* 6.6 6.9  ALBUMIN 3.8 4.1 4.7   Recent Labs    03/07/19 1454 04/07/19 1530 04/08/19 0021 04/12/19 0536 05/18/19 0000  WBC 6.3 8.2 6.9 5.3 4.6  NEUTROABS 4.6 6.5  --   --  3  HGB 13.3 13.3 12.7 12.9 12.2  HCT 40.5 41.2 40.0 42.0 36  MCV 93.6 95.2 97.6 100.0  --   PLT 89.0* 75* 76* 70* 84*   No results for input(s): CHOL, LDLCALC, TRIG in the last 8760 hours.  Invalid input(s): HCL No results found for: [facility] Lab Results  Component Value Date   TSH 3.58 08/22/2018   Lab Results  Component Value Date   HGBA1C 5.6 11/13/2013   Lab Results  Component Value Date   CHOL 97 08/16/2017   HDL 37.70 (L) 08/16/2017   LDLCALC 48 08/16/2017   TRIG 56.0 08/16/2017   CHOLHDL 3 08/16/2017    Significant Diagnostic Results in last 30 days:  No  results found.  Assessment and Plan  Atrial fibrillation/anticoagulated on Coumadin-patient's INR is 2.4 today after holding.  Will start on 1 mg daily and repeat INR on Friday to make sure she not going out again.     [name] , MD

## 2019-06-21 ENCOUNTER — Encounter: Payer: Self-pay | Admitting: Internal Medicine

## 2019-06-21 LAB — BASIC METABOLIC PANEL
BUN: 20 (ref 4–21)
CO2: 25 — AB (ref 13–22)
Chloride: 112 — AB (ref 99–108)
Creatinine: 0.9 (ref 0.5–1.1)
Glucose: 118
Potassium: 4.5 (ref 3.4–5.3)
Sodium: 146 (ref 137–147)

## 2019-06-21 LAB — COMPREHENSIVE METABOLIC PANEL
Calcium: 9.4 (ref 8.7–10.7)
GFR calc Af Amer: 65.19
GFR calc non Af Amer: 56.25

## 2019-06-22 ENCOUNTER — Non-Acute Institutional Stay (SKILLED_NURSING_FACILITY): Payer: Medicare Other | Admitting: Internal Medicine

## 2019-06-22 ENCOUNTER — Encounter: Payer: Self-pay | Admitting: Internal Medicine

## 2019-06-22 DIAGNOSIS — E870 Hyperosmolality and hypernatremia: Secondary | ICD-10-CM

## 2019-06-22 DIAGNOSIS — I5030 Unspecified diastolic (congestive) heart failure: Secondary | ICD-10-CM

## 2019-06-22 NOTE — Progress Notes (Signed)
This is an acute visit.  Level care skilled.  [facility]ility [facility]  Chief complaint-acute visit follow-up hypernatremia.  History of present illness.  Patient is a pleasant 85 year old female with a history of weakness-she also has a history of intermittent hypernatremia-as well as lower extremity edema atrial fibrillation history of pulmonary embolism she is on chronic Coumadin.  She also has a history of chronic kidney disease as well as hyperlipidemia hypertension.  At times she will get an IV short-term to help with intermittent hypernatremia-her Lasix is currently on hold as well.  Lab shows that her sodium is creeping up a bit at 146.  She says she is drinking well and fluids are being encouraged.  Her edema appears to be well controlled her weight appears to be stable despite being off the Lasix.  She is not complaining of any increased shortness of breath or chest pain.  Past Medical History:  Diagnosis Date  . Acute encephalopathy   . Acute renal failure superimposed on stage 3 chronic kidney disease ([place])   . Anemia   . Atrial fibrillation ([place])   . Colon cancer ([place]) 12/11  . Colon cancer ([place]) 2011  . Diarrhea   . DVT (deep venous thrombosis) ([place]) 11/06  . Elevated brain natriuretic peptide (BNP) level   . Frequent headaches   . Heart murmur   . Hyperkalemia   . Hypertension   . Pulmonary embolism ([place]) 11/06  . Seizures ([place])   . Stroke ([place]) 11/30/2014  . Urine retention 01/2019        Past Surgical History:  Procedure Laterality Date  . APPENDECTOMY  1952  . COLECTOMY  06/2010   partial, Dr. Donne Hazel complicated by LLE CVT; S/P IVC umbrella & anemia  . HIP FRACTURE SURGERY  2006   Trauma  . TOTAL ABDOMINAL HYSTERECTOMY W/ BILATERAL SALPINGOOPHORECTOMY  1973   For Fibroids  . TOTAL HIP ARTHROPLASTY  01/03/07   Left hip replacement.  Marland Kitchen VENA CAVA FILTER PLACEMENT  06/28/2010    No Known Allergies    MEDICATIONS       Sig  . acetaminophen (TYLENOL) 500 MG tablet Take 1,000 mg by mouth every 8 (eight) hours as needed for headache (pain).   Marland Kitchen albuterol (PROVENTIL) (2.5 MG/3ML) 0.083% nebulizer solution Take 2.5 mg by nebulization every 6 (six) hours as needed for wheezing or shortness of breath.  Marland Kitchen aspirin 81 MG chewable tablet Chew 81 mg by mouth daily.  Marland Kitchen atorvastatin (LIPITOR) 20 MG tablet Take 1 tablet (20 mg total) by mouth daily.  . calcium-vitamin D (OSCAL WITH D) 500-200 MG-UNIT TABS tablet Take 1 tablet by mouth daily.  . Ensure (ENSURE) Take 237 mLs by mouth 2 (two) times daily.  Marland Kitchen escitalopram (LEXAPRO) 5 MG tablet Take 5 mg by mouth daily.  . isosorbide mononitrate (IMDUR) 30 MG 24 hr tablet Take 0.5 tablets (15 mg total) by mouth daily.  Marland Kitchen loratadine (CLARITIN) 10 MG tablet Take 1 tablet (10 mg total) by mouth daily as needed (seasonal allergies).  . metoprolol tartrate (LOPRESSOR) 25 MG tablet Take 1.5 tablets (37.5 mg total) by mouth 2 (two) times daily.  . mometasone-formoterol (DULERA) 100-5 MCG/ACT AERO Inhale 2 puffs into the lungs 2 (two) times daily.  . Multiple Vitamin (MULTIVITAMIN WITH MINERALS) TABS tablet Take 1 tablet by mouth daily.  Marland Kitchen nystatin (MYCOSTATIN/NYSTOP) powder Apply topically daily. Apply under bilateral breasts QD  . polyethylene glycol (MIRALAX / GLYCOLAX) packet Take 17 g by mouth daily  as needed.  . promethazine (PHENERGAN) 12.5 MG tablet Take 1 tablet (12.5 mg total) by mouth 3 (three) times daily as needed for nausea or vomiting.  Marland Kitchen QUEtiapine (SEROQUEL) 25 MG tablet Take 2 tablets (50 mg total) by mouth at bedtime.  . tamsulosin (FLOMAX) 0.4 MG CAPS capsule Take 1 capsule (0.4 mg total) by mouth daily.  . verapamil (VERELAN PM) 120 MG 24 hr capsule Take 120 mg by mouth at bedtime.  Marland Kitchen warfarin (COUMADIN) 1 MG tablet Take 1 mg by mouth daily. Recheck INR 06/23/19  . albuterol (VENTOLIN HFA) 108 (90 Base) MCG/ACT inhaler Inhale 2 puffs into the lungs every 6 (six)  hours as needed for wheezing or shortness of breath.         Review of systems.  In general she is not complaining of any fever or chills.  Skin does not complain of rashes or itching.  Head ears eyes nose mouth and throat is not complaining of visual changes or sore throat.  Respiratory does not complain of being short of breath or having cough.  Cardiac does not complain of chest pain edema of her right leg appears to be baseline does not really have edema of her left leg.  GI does not complain of abdominal discomfort nausea vomiting diarrhea constipation says her appetite is okay she eats what she wants.  GU is not complaining of dysuria.  Musculoskeletal does not complain of joint pain does have chronic lower extremity weakness.  Neurologic is positive for weakness lower extremities does not complain of any dizziness headache or syncope.  Psych does not complain of being depressed or anxious appears to be in good spirits   Physical exam.  She is afebrile pulse is 60 respirations 18 blood pressure 130/63.  In general this is a pleasant elderly female in no distress.  Her skin is warm and dry.  She does have some chronic bruising left upper arm the side of her IV site.  Eyes visual acuity appears to be intact her sclera and conjunctive are clear.  Oropharynx clear mucous membranes moist.  Chest is clear to auscultation there is no labored breathing.  Heart is regular irregular rate and rhythm without murmur gallop or rub she has mild baseline right lower extremity edema this appears unchanged from previous exams.  Abdomen is soft nontender with positive bowel sounds.  Musculoskeletal moves all extremities x4 at baseline limited exam since she is in bed-does have chronic lower extremity weakness.  Neurologic is grossly intact her speech is clear could not really appreciate any lateralizing findings.  Psych she is pleasant and appropriate follow simple verbal  commands without any difficulty.  Labs.  June 21, 2019.  Sodium 146 potassium 4.5 BUN 19.6 creatinine 0.90.  Recent Labs    01/22/19 0405 01/24/19 0432 04/08/19 0021 04/11/19 0556 04/12/19 0536 04/14/19 0532 05/18/19 0000 06/07/19 0000 06/14/19 0000 06/15/19 0000  NA 142 144 144 148* 147* 145 142 145 150* 147  K 3.2* 4.7 3.5 4.3 4.1 3.8 5.1 5.3 4.4 5.0  CL 105 112* 107 111 108 109 110* 111* 114* 112*  CO2 27 24 27 27 25 24 19 21 22  19  GLUCOSE 92 107* 114* 87 93 103*  --   --   --   --   BUN 24* 25* 41* 31* 29* 26* 58* 32* 19 23*  CREATININE 1.41* 1.33* 1.96* 1.28* 1.26* 1.13* 1.6* 1.2* 1.1 1.1  CALCIUM 8.5* 9.4 9.2 9.0 9.3 9.4 9.4 8.7 9.0  --  MG 2.1 2.0 2.2  --   --   --   --   --   --   --   PHOS 3.4  --  4.0  --   --   --   --   --   --   --      Recent Labs (within last 365 days)       Recent Labs    01/20/19 0918 03/07/19 1454 04/07/19 1530  AST 25 17 31   ALT 15 10 15   ALKPHOS 41 43 56  BILITOT 0.7 0.8 1.4*  PROT 5.9* 6.6 6.9  ALBUMIN 3.8 4.1 4.7     Recent Labs (within last 365 days)         Recent Labs    03/07/19 1454 04/07/19 1530 04/08/19 0021 04/12/19 0536 05/18/19 0000  WBC 6.3 8.2 6.9 5.3 4.6  NEUTROABS 4.6 6.5  --   --  3  HGB 13.3 13.3 12.7 12.9 12.2  HCT 40.5 41.2 40.0 42.0 36  MCV 93.6 95.2 97.6 100.0  --   PLT 89.0* 75* 76* 70* 84*     Recent Labs       Lab Results  Component Value Date   TSH 3.58 08/22/2018     Assessment and plan.  -Intermittent hypernatremia sodium appears to be slowly trending up again will give her a liter of IV fluids at 75 cc an hour one half normal saline and recheck a BMP tomorrow.  2.  History of lower extremity edema-CHF this appears stable edema is not increased-her weight appears to be stable this will need continued monitoring especially since she is off her Lasix.  [id number]

## 2019-06-23 LAB — BASIC METABOLIC PANEL
BUN: 21 (ref 4–21)
CO2: 22 (ref 13–22)
Chloride: 110 — AB (ref 99–108)
Creatinine: 0.9 (ref 0.5–1.1)
Glucose: 83
Potassium: 4.2 (ref 3.4–5.3)
Sodium: 144 (ref 137–147)

## 2019-06-23 LAB — COMPREHENSIVE METABOLIC PANEL
Calcium: 8.9 (ref 8.7–10.7)
GFR calc Af Amer: 62.66
GFR calc non Af Amer: 54.06

## 2019-06-25 ENCOUNTER — Encounter: Payer: Self-pay | Admitting: Internal Medicine

## 2019-06-26 ENCOUNTER — Non-Acute Institutional Stay (SKILLED_NURSING_FACILITY): Payer: Medicare Other | Admitting: Internal Medicine

## 2019-06-26 DIAGNOSIS — I4811 Longstanding persistent atrial fibrillation: Secondary | ICD-10-CM

## 2019-06-26 DIAGNOSIS — Z7901 Long term (current) use of anticoagulants: Secondary | ICD-10-CM | POA: Diagnosis not present

## 2019-06-26 LAB — BASIC METABOLIC PANEL
BUN: 23 — AB (ref 4–21)
CO2: 25 — AB (ref 13–22)
Chloride: 111 — AB (ref 99–108)
Creatinine: 0.9 (ref 0.5–1.1)
Glucose: 90
Potassium: 4.3 (ref 3.4–5.3)
Sodium: 145 (ref 137–147)

## 2019-06-26 LAB — COMPREHENSIVE METABOLIC PANEL
Calcium: 8.9 (ref 8.7–10.7)
GFR calc Af Amer: 64.32
GFR calc non Af Amer: 55.5

## 2019-06-27 DIAGNOSIS — J069 Acute upper respiratory infection, unspecified: Secondary | ICD-10-CM | POA: Diagnosis not present

## 2019-06-29 ENCOUNTER — Non-Acute Institutional Stay (SKILLED_NURSING_FACILITY): Payer: Medicare Other | Admitting: Internal Medicine

## 2019-06-29 DIAGNOSIS — E870 Hyperosmolality and hypernatremia: Secondary | ICD-10-CM | POA: Diagnosis not present

## 2019-06-29 DIAGNOSIS — I5032 Chronic diastolic (congestive) heart failure: Secondary | ICD-10-CM | POA: Diagnosis not present

## 2019-06-29 DIAGNOSIS — Z87448 Personal history of other diseases of urinary system: Secondary | ICD-10-CM

## 2019-06-29 LAB — BASIC METABOLIC PANEL
BUN: 27 — AB (ref 4–21)
CO2: 24 — AB (ref 13–22)
Chloride: 112 — AB (ref 99–108)
Creatinine: 1 (ref 0.5–1.1)
Glucose: 96
Potassium: 4.3 (ref 3.4–5.3)
Sodium: 146 (ref 137–147)

## 2019-06-29 LAB — COMPREHENSIVE METABOLIC PANEL
Calcium: 9.2 (ref 8.7–10.7)
GFR calc Af Amer: 55.37
GFR calc non Af Amer: 47.78

## 2019-06-29 NOTE — Progress Notes (Signed)
This is an acute visit.  Level of care skilled.  [facility]  Chief complaint acute visit secondary to recurrent hypernatremia.  History of present illness.  Patient is a pleasant 85 year old female seen today for mild hypernatremia.  She is now a long-term resident of the facility with a history of intermittent hypernatremia she also has a history of lower extremity edema atrial fibrillation history of pulmonary embolism she is on Coumadin.  She also has a history of chronic kidney disease as well as hyperlipidemia and hypertension.  Occasionally she will get an IV short-term to help with her intermittent hypernatremia-her Lasix is on hold as well.  Her sodium again is creeping up a bit at 146 that had gone down somewhat previously and staff is encouraging fluids.  She says she is drinking well.  Her edema continues to be well controlled despite being off the Lasix her weight continues to be stable at around 168 pounds.  She does not complain of any increased shortness of breath or chest pain or chest congestion.     Past Medical History:  Diagnosis Date  . Acute encephalopathy   . Acute renal failure superimposed on stage 3 chronic kidney disease ([place])   . Anemia   . Atrial fibrillation ([place])   . Colon cancer ([place]) 12/11  . Colon cancer ([place]) 2011  . Diarrhea   . DVT (deep venous thrombosis) ([place]) 11/06  . Elevated brain natriuretic peptide (BNP) level   . Frequent headaches   . Heart murmur   . Hyperkalemia   . Hypertension   . Pulmonary embolism ([place]) 11/06  . Seizures ([place])   . Stroke ([place]) 11/30/2014  . Urine retention 01/2019        Past Surgical History:  Procedure Laterality Date  . APPENDECTOMY  1952  . COLECTOMY  06/2010   partial, Dr. Donne Hazel complicated by LLE CVT; S/P IVC umbrella & anemia  . HIP FRACTURE SURGERY  2006   Trauma  . TOTAL ABDOMINAL HYSTERECTOMY W/ BILATERAL SALPINGOOPHORECTOMY  1973   For  Fibroids  . TOTAL HIP ARTHROPLASTY  01/03/07   Left hip replacement.  Marland Kitchen VENA CAVA FILTER PLACEMENT  06/28/2010    No Known Allergies    MEDICATIONS      Sig  . acetaminophen (TYLENOL) 500 MG tablet Take 1,000 mg by mouth every 8 (eight) hours as needed for headache (pain).   Marland Kitchen albuterol (PROVENTIL) (2.5 MG/3ML) 0.083% nebulizer solution Take 2.5 mg by nebulization every 6 (six) hours as needed for wheezing or shortness of breath.  Marland Kitchen aspirin 81 MG chewable tablet Chew 81 mg by mouth daily.  Marland Kitchen atorvastatin (LIPITOR) 20 MG tablet Take 1 tablet (20 mg total) by mouth daily.  . calcium-vitamin D (OSCAL WITH D) 500-200 MG-UNIT TABS tablet Take 1 tablet by mouth daily.  . Ensure (ENSURE) Take 237 mLs by mouth 2 (two) times daily.  Marland Kitchen escitalopram (LEXAPRO) 5 MG tablet Take 5 mg by mouth daily.  . isosorbide mononitrate (IMDUR) 30 MG 24 hr tablet Take 0.5 tablets (15 mg total) by mouth daily.  Marland Kitchen loratadine (CLARITIN) 10 MG tablet Take 1 tablet (10 mg total) by mouth daily as needed (seasonal allergies).  . metoprolol tartrate (LOPRESSOR) 25 MG tablet Take 1.5 tablets (37.5 mg total) by mouth 2 (two) times daily.  . mometasone-formoterol (DULERA) 100-5 MCG/ACT AERO Inhale 2 puffs into the lungs 2 (two) times daily.  . Multiple Vitamin (MULTIVITAMIN WITH MINERALS) TABS tablet Take 1 tablet  by mouth daily.  Marland Kitchen nystatin (MYCOSTATIN/NYSTOP) powder Apply topically daily. Apply under bilateral breasts QD  . polyethylene glycol (MIRALAX / GLYCOLAX) packet Take 17 g by mouth daily as needed.  . promethazine (PHENERGAN) 12.5 MG tablet Take 1 tablet (12.5 mg total) by mouth 3 (three) times daily as needed for nausea or vomiting.  Marland Kitchen QUEtiapine (SEROQUEL) 25 MG tablet Take 2 tablets (50 mg total) by mouth at bedtime.  . tamsulosin (FLOMAX) 0.4 MG CAPS capsule Take 1 capsule (0.4 mg total) by mouth daily.  . verapamil (VERELAN PM) 120 MG 24 hr capsule Take 120 mg by mouth at bedtime.  Marland Kitchen warfarin  (COUMADIN) 1 MG tablet Take 1 mg by mouth daily. Recheck INR 06/23/19  . albuterol (VENTOLIN HFA) 108 (90 Base) MCG/ACT inhaler Inhale 2 puffs into the lungs every 6 (six) hours as needed for wheezing or shortness of breath.    Review of systems.  This is somewhat limited since she is a somewhat poor historian.  General she not complaining of fever or chills.  Skin is not complaining of any itching or rashes or diaphoresis  Head ears eyes nose mouth throat does not complain of visual changes sore throat.  Respiratory did not complain of having shortness of breath or coughing.  Cardiac does not complain of chest pain-edema appears to be baseline this is more so over right leg-does not really have significant edema of her left leg.  GI is not complaining of abdominal pain nausea vomiting diarrhea constipation-she thinks she has a good appetite.  GU does not complain of dysuria.  Musculoskeletal continues to have weakness but is not complaining of joint pain.  Neurologic is positive for lower extremity weakness-is not complaining of syncope dizziness headache or numbness.  Psych appears to be in good spirits is bright alert does not complain of being depressed or anxious.  Physical exam.  Temperature is 98.0 pulse 64 respiration 17 blood pressure 132/68 weight appears stable at 167.8 pounds.  In general this is a pleasant elderly female no distress lying comfortably in bed.  Her skin is warm and dry.  Eyes visual acuity appears to be intact sclera and conjunctive are clear.  Oropharynx clear mucous membranes moist. Chest is clear to auscultation there is no labored breathing.  Heart is irregular irregular rate and rhythm she has quite mild right lower extremity edema which appears baseline does not really have significant left lower extremity edema.  Musculoskeletal Limited exam since she is in bed but is able to move all extremities x4 at baseline with chronic lower  extremity weakness.  Neurologically grossly intact her speech is clear could not really appreciate any lateralizing findings.  Psych she is pleasant and appropriate follows verbal commands without difficulty does have some cognitive deficits but does well with supportive care.  Labs.  June 29, 2019.  Sodium 146 potassium 4.3 BUN 27.1 creatinine 1.03.  Previous sodiums again have hovered more in the mid to higher 140s she has been as high as 150  Assessment and plan.  -Hypernatremia-there appears to be some chronicity to this-staff has been encouraging fluids but at times she does benefit from a short course of IV half-normal saline-we will give her a couple liters of half-normal saline and recheck a metabolic panel on first laboratory day next week.  Clinically she appears to be stable.  2.  History of lower extremity edema with CHF-her weight and edema appears to be stable she is not complaining of any cough shortness  of breath or chest pain at this point will monitor-continue to hold Lasix secondary to her elevated sodium.  3.  History of chronic kidney disease this appears stable with creatinine 1.03 BUN of 27.1  [id number]

## 2019-07-02 ENCOUNTER — Encounter: Payer: Self-pay | Admitting: Internal Medicine

## 2019-07-02 NOTE — Progress Notes (Signed)
Location:      Place of Service:     Colon Branch, MD  Patient Care Team: Colon Branch, MD as PCP - General (Internal Medicine)  Extended Emergency Contact Information Primary Emergency Contact: [facility] Address: [address]          [place], [place] [address] [name] of [place] Phone: [phone] Mobile Phone: [phone] Relation: Daughter    Allergies: Patient has no known allergies.  Chief Complaint  Patient presents with  . Acute Visit    HPI: Patient is 85 y.o. female with atrial fibrillation who is anticoagulated on Coumadin who is being seen today for an INR check.  Today patient's INR is 1.3, which is subtherapeutic.  Past Medical History:  Diagnosis Date  . Acute encephalopathy   . Acute renal failure superimposed on stage 3 chronic kidney disease ([place])   . Anemia   . Atrial fibrillation ([place])   . Colon cancer ([place]) 12/11  . Colon cancer ([place]) 2011  . Diarrhea   . DVT (deep venous thrombosis) ([place]) 11/06  . Elevated brain natriuretic peptide (BNP) level   . Frequent headaches   . Heart murmur   . Hyperkalemia   . Hypertension   . Pulmonary embolism ([place]) 11/06  . Seizures ([place])   . Stroke ([place]) 11/30/2014  . Urine retention 01/2019    Past Surgical History:  Procedure Laterality Date  . APPENDECTOMY  1952  . COLECTOMY  06/2010   partial, Dr. Donne Hazel complicated by LLE CVT; S/P IVC umbrella & anemia  . HIP FRACTURE SURGERY  2006   Trauma  . TOTAL ABDOMINAL HYSTERECTOMY W/ BILATERAL SALPINGOOPHORECTOMY  1973   For Fibroids  . TOTAL HIP ARTHROPLASTY  01/03/07   Left hip replacement.  Marland Kitchen VENA CAVA FILTER PLACEMENT  06/28/2010    Allergies as of 06/26/2019   No Known Allergies     Medication List       Accurate as of June 26, 2019 11:59 PM. If you have any questions, ask your nurse or doctor.        acetaminophen 500 MG tablet Commonly known as: TYLENOL Take 1,000 mg by mouth every 8 (eight) hours as needed  for headache (pain).   albuterol (2.5 MG/3ML) 0.083% nebulizer solution Commonly known as: PROVENTIL Take 2.5 mg by nebulization every 6 (six) hours as needed for wheezing or shortness of breath.   albuterol 108 (90 Base) MCG/ACT inhaler Commonly known as: VENTOLIN HFA Inhale 2 puffs into the lungs every 6 (six) hours as needed for wheezing or shortness of breath.   aspirin 81 MG chewable tablet Chew 81 mg by mouth daily.   atorvastatin 20 MG tablet Commonly known as: LIPITOR Take 1 tablet (20 mg total) by mouth daily.   calcium-vitamin D 500-200 MG-UNIT Tabs tablet Commonly known as: OSCAL WITH D Take 1 tablet by mouth daily.   Dulera 100-5 MCG/ACT Aero Generic drug: mometasone-formoterol Inhale 2 puffs into the lungs 2 (two) times daily.   Ensure Take 237 mLs by mouth 2 (two) times daily.   escitalopram 5 MG tablet Commonly known as: LEXAPRO Take 5 mg by mouth daily.   isosorbide mononitrate 30 MG 24 hr tablet Commonly known as: IMDUR Take 0.5 tablets (15 mg total) by mouth daily.   loratadine 10 MG tablet Commonly known as: CLARITIN Take 1 tablet (10 mg total) by mouth daily as needed (seasonal allergies).   metoprolol tartrate 25 MG tablet Commonly known as: LOPRESSOR Take 1.5 tablets (37.5  mg total) by mouth 2 (two) times daily.   multivitamin with minerals Tabs tablet Take 1 tablet by mouth daily.   nystatin powder Commonly known as: MYCOSTATIN/NYSTOP Apply topically daily. Apply under bilateral breasts QD   polyethylene glycol 17 g packet Commonly known as: MIRALAX / GLYCOLAX Take 17 g by mouth daily as needed.   promethazine 12.5 MG tablet Commonly known as: PHENERGAN Take 1 tablet (12.5 mg total) by mouth 3 (three) times daily as needed for nausea or vomiting.   QUEtiapine 25 MG tablet Commonly known as: SEROQUEL Take 2 tablets (50 mg total) by mouth at bedtime.   tamsulosin 0.4 MG Caps capsule Commonly known as: FLOMAX Take 1 capsule (0.4 mg  total) by mouth daily.   verapamil 120 MG 24 hr capsule Commonly known as: VERELAN PM Take 120 mg by mouth at bedtime.   warfarin 1 MG tablet Commonly known as: COUMADIN Take as directed by the anticoagulation clinic. If you are unsure how to take this medication, talk to your nurse or doctor. Original instructions: Take 1 mg by mouth daily. Recheck INR 06/23/19       No orders of the defined types were placed in this encounter.   Immunization History  Administered Date(s) Administered  . Fluad Quad(high Dose 65+) 03/07/2019  . Influenza Split 03/30/2012  . Influenza Whole 04/30/2006, 04/20/2008, 04/15/2009  . Influenza, High Dose Seasonal PF 04/20/2013, 03/27/2015, 03/31/2016, 04/22/2017, 04/20/2018  . Influenza,inj,Quad PF,6+ Mos 05/23/2014  . Pneumococcal Conjugate-13 04/24/2015  . Pneumococcal Polysaccharide-23 07/06/2009  . Td 10/30/2016    Social History   Tobacco Use  . Smoking status: Passive Smoke Exposure - Never Smoker  . Smokeless tobacco: Never Used  . Tobacco comment: husband smoked in home.   Substance Use Topics  . Alcohol use: No    Alcohol/week: 0.0 standard drinks     Vitals:   07/02/19 2311  BP: 102/67  Pulse: 66  Resp: 16  Temp: (!) 97.4 F (36.3 C)   There is no height or weight on file to calculate BMI.   Patient Active Problem List   Diagnosis Date Noted  . Altered behavior 05/10/2019  . Klebsiella cystitis 04/22/2019  . Metabolic encephalopathy [id number]  . Back pain 04/08/2019  . MCI (mild cognitive impairment) 03/07/2019  . Congestive heart failure (CHF) ([place]) 01/20/2019  . CAD (coronary artery disease) 06/07/2018  . Pulmonary hypertension, primary ([place]) 06/07/2018  . Generalized weakness   . Abnormal LFTs 06/01/2018  . Hallucination 06/01/2018  . Abnormal chest x-ray 09/28/2015  . Edema of right lower extremity 09/28/2015  . Elevated brain natriuretic peptide (BNP) level 09/17/2015  . Seizures ([place]) 09/16/2015  . Asthma  08/14/2015  . PCP NOTES >>>>>>>>>>>>>>>>>>>>>>>>>>>>>> 04/24/2015  . TIA (transient ischemic attack) 12/11/2014  . Dyslipidemia 12/11/2014  . History of CVA (cerebrovascular accident) 12/01/2014  . Thrombocytopenia ([place]) 12/01/2014  . Chronic renal insufficiency, stage II (mild) 11/12/2013  . Impingement syndrome, shoulder, left 11/09/2013  . Acquired short bowel syndrome 11/09/2013  . Annual physical exam 08/30/2013  . Hx pulmonary embolism 06/27/2013  . Warfarin anticoagulation 06/27/2013  . DDD (degenerative disc disease), lumbar 11/18/2012  . ATRIAL FIBRILLATION   07/08/2010  . COLON CANCER, HX OF 07/08/2010  . DIZZINESS 10/18/2009  . Essential hypertension 12/16/2007  . HIP PAIN, CHRONIC 12/28/2006  . ANEMIA-NOS 07/15/2006  . Personal history of venous thrombosis and embolism 07/15/2006    CMP     Component Value Date/Time   NA 147 06/15/2019 0000  NA 142 06/09/2012 1509   K 5.0 06/15/2019 0000   K 3.9 06/09/2012 1509   CL 112 (A) 06/15/2019 0000   CL 108 (H) 06/09/2012 1509   CO2 19 06/15/2019 0000   CO2 26 06/09/2012 1509   GLUCOSE 103 (H) 04/14/2019 0532   GLUCOSE 103 (H) 06/09/2012 1509   BUN 23 (A) 06/15/2019 0000   BUN 17.0 06/09/2012 1509   CREATININE 1.1 06/15/2019 0000   CREATININE 1.13 (H) 04/14/2019 0532   CREATININE 1.51 (H) 07/22/2018 1624   CREATININE 1.2 (H) 06/09/2012 1509   CALCIUM 9.0 06/14/2019 0000   CALCIUM 9.5 06/09/2012 1509   PROT 6.9 04/07/2019 1530   PROT 6.9 01/13/2018 1620   PROT 7.2 06/09/2012 1509   ALBUMIN 4.7 04/07/2019 1530   ALBUMIN 4.6 01/13/2018 1620   ALBUMIN 4.2 06/09/2012 1509   AST 31 04/07/2019 1530   AST 17 06/09/2012 1509   ALT 15 04/07/2019 1530   ALT 11 06/09/2012 1509   ALKPHOS 56 04/07/2019 1530   ALKPHOS 47 06/09/2012 1509   BILITOT 1.4 (H) 04/07/2019 1530   BILITOT 1.1 01/13/2018 1620   BILITOT 0.73 06/09/2012 1509   GFRNONAA 42.27 06/15/2019 0000   GFRAA 48.99 06/15/2019 0000   Recent Labs     01/22/19 0405 01/24/19 0432 04/08/19 0021 04/11/19 0556 04/12/19 0536 04/14/19 0532 04/14/19 0532 05/18/19 0000 06/07/19 0000 06/14/19 0000 06/15/19 0000  NA 142 144 144 148* 147* 145   < > 142 145 150* 147  K 3.2* 4.7 3.5 4.3 4.1 3.8  --  5.1 5.3 4.4 5.0  CL 105 112* 107 111 108 109  --  110* 111* 114* 112*  CO2 27 24 27 27 25 24   --  19 21 22 19   GLUCOSE 92 107* 114* 87 93 103*  --   --   --   --   --   BUN 24* 25* 41* 31* 29* 26*   < > 58* 32* 19 23*  CREATININE 1.41* 1.33* 1.96* 1.28* 1.26* 1.13*   < > 1.6* 1.2* 1.1 1.1  CALCIUM 8.5* 9.4 9.2 9.0 9.3 9.4  --  9.4 8.7 9.0  --   MG 2.1 2.0 2.2  --   --   --   --   --   --   --   --   PHOS 3.4  --  4.0  --   --   --   --   --   --   --   --    < > = values in this interval not displayed.   Recent Labs    01/20/19 0918 03/07/19 1454 04/07/19 1530  AST 25 17 31   ALT 15 10 15   ALKPHOS 41 43 56  BILITOT 0.7 0.8 1.4*  PROT 5.9* 6.6 6.9  ALBUMIN 3.8 4.1 4.7   Recent Labs    03/07/19 1454 04/07/19 1530 04/08/19 0021 04/12/19 0536 05/18/19 0000  WBC 6.3 8.2 6.9 5.3 4.6  NEUTROABS 4.6 6.5  --   --  3  HGB 13.3 13.3 12.7 12.9 12.2  HCT 40.5 41.2 40.0 42.0 36  MCV 93.6 95.2 97.6 100.0  --   PLT 89.0* 75* 76* 70* 84*   No results for input(s): CHOL, LDLCALC, TRIG in the last 8760 hours.  Invalid input(s): HCL No results found for: [facility] Lab Results  Component Value Date   TSH 3.58 08/22/2018   Lab Results  Component Value Date   HGBA1C 5.6 11/13/2013  Lab Results  Component Value Date   CHOL 97 08/16/2017   HDL 37.70 (L) 08/16/2017   LDLCALC 48 08/16/2017   TRIG 56.0 08/16/2017   CHOLHDL 3 08/16/2017    Significant Diagnostic Results in last 30 days:  No results found.  Assessment and Plan  Atrial fibrillation/anticoagulated on Coumadin-today patient's INR is 1.3 after being on Coumadin 1.5 mg daily for 3 days.  Today we will start 2 mg daily and repeat in 1 week, 12/28.    [name] ,  MD

## 2019-07-02 NOTE — Progress Notes (Signed)
Location:      Place of Service:     Colon Branch, MD  Patient Care Team: Colon Branch, MD as PCP - General (Internal Medicine)  Extended Emergency Contact Information Primary Emergency Contact: [facility] Address: [address]          [place], [place] [address] [name] of [place] Phone: [phone] Mobile Phone: [phone] Relation: Daughter    Allergies: Patient has no known allergies.  No chief complaint on file.   HPI: Patient is 85 y.o. female who   Past Medical History:  Diagnosis Date  . Acute encephalopathy   . Acute renal failure superimposed on stage 3 chronic kidney disease ([place])   . Anemia   . Atrial fibrillation ([place])   . Colon cancer ([place]) 12/11  . Colon cancer ([place]) 2011  . Diarrhea   . DVT (deep venous thrombosis) ([place]) 11/06  . Elevated brain natriuretic peptide (BNP) level   . Frequent headaches   . Heart murmur   . Hyperkalemia   . Hypertension   . Pulmonary embolism ([place]) 11/06  . Seizures ([place])   . Stroke ([place]) 11/30/2014  . Urine retention 01/2019    Past Surgical History:  Procedure Laterality Date  . APPENDECTOMY  1952  . COLECTOMY  06/2010   partial, Dr. Donne Hazel complicated by LLE CVT; S/P IVC umbrella & anemia  . HIP FRACTURE SURGERY  2006   Trauma  . TOTAL ABDOMINAL HYSTERECTOMY W/ BILATERAL SALPINGOOPHORECTOMY  1973   For Fibroids  . TOTAL HIP ARTHROPLASTY  01/03/07   Left hip replacement.  Marland Kitchen VENA CAVA FILTER PLACEMENT  06/28/2010    Allergies as of 02/28/2019   No Known Allergies     Medication List       Accurate as of February 28, 2019 11:59 PM. If you have any questions, ask your nurse or doctor.        acetaminophen 500 MG tablet Commonly known as: TYLENOL Take 1,000 mg by mouth every 8 (eight) hours as needed for headache (pain).   aspirin EC 81 MG tablet Take 81 mg by mouth daily.   atorvastatin 20 MG tablet Commonly known as: LIPITOR Take 1 tablet (20 mg total) by mouth daily.     Dulera 100-5 MCG/ACT Aero Generic drug: mometasone-formoterol Inhale 2 puffs into the lungs 2 (two) times daily.   Ensure Take 237 mLs by mouth daily with breakfast. Milk chocolate   furosemide 80 MG tablet Commonly known as: LASIX Take 1 tablet (80 mg total) by mouth daily.   isosorbide mononitrate 30 MG 24 hr tablet Commonly known as: IMDUR Take 0.5 tablets (15 mg total) by mouth daily.   loratadine 10 MG tablet Commonly known as: CLARITIN Take 1 tablet (10 mg total) by mouth daily as needed (seasonal allergies).   metoprolol tartrate 25 MG tablet Commonly known as: LOPRESSOR Take 1.5 tablets (37.5 mg total) by mouth 2 (two) times daily.   multivitamin with minerals Tabs tablet Take 1 tablet by mouth daily.   nitroGLYCERIN 0.4 MG SL tablet Commonly known as: NITROSTAT Place 1 tablet (0.4 mg total) under the tongue every 5 (five) minutes as needed for chest pain.   polyethylene glycol 17 g packet Commonly known as: MIRALAX / GLYCOLAX Take 17 g by mouth daily as needed.   albuterol (2.5 MG/3ML) 0.083% nebulizer solution Commonly known as: PROVENTIL Take 2.5 mg by nebulization every 6 (six) hours as needed for wheezing or shortness of breath.   ProAir HFA 108 (  90 Base) MCG/ACT inhaler Generic drug: albuterol Inhale 2 puffs into the lungs every 6 (six) hours as needed for wheezing or shortness of breath.   promethazine 12.5 MG tablet Commonly known as: PHENERGAN Take 1 tablet (12.5 mg total) by mouth 3 (three) times daily as needed for nausea or vomiting.   QUEtiapine 25 MG tablet Commonly known as: SEROQUEL Take 2 tablets (50 mg total) by mouth at bedtime.   tamsulosin 0.4 MG Caps capsule Commonly known as: FLOMAX Take 1 capsule (0.4 mg total) by mouth daily.   traMADol 50 MG tablet Commonly known as: ULTRAM Take 1 tablet (50 mg total) by mouth every 6 (six) hours as needed for moderate pain.   verapamil 120 MG CR tablet Commonly known as: CALAN-SR Take 1  tablet (120 mg total) by mouth at bedtime.   warfarin 2.5 MG tablet Commonly known as: COUMADIN Take as directed by the anticoagulation clinic. If you are unsure how to take this medication, talk to your nurse or doctor. Original instructions: USE AS DIRECTED BY COUMADIN CLINIC What changed:   how much to take  how to take this  additional instructions       No orders of the defined types were placed in this encounter.   Immunization History  Administered Date(s) Administered  . Fluad Quad(high Dose 65+) 03/07/2019  . Influenza Split 03/30/2012  . Influenza Whole 04/30/2006, 04/20/2008, 04/15/2009  . Influenza, High Dose Seasonal PF 04/20/2013, 03/27/2015, 03/31/2016, 04/22/2017, 04/20/2018  . Influenza,inj,Quad PF,6+ Mos 05/23/2014  . Pneumococcal Conjugate-13 04/24/2015  . Pneumococcal Polysaccharide-23 07/06/2009  . Td 10/30/2016    Social History   Tobacco Use  . Smoking status: Passive Smoke Exposure - Never Smoker  . Smokeless tobacco: Never Used  . Tobacco comment: husband smoked in home.   Substance Use Topics  . Alcohol use: No    Alcohol/week: 0.0 standard drinks     There were no vitals filed for this visit. There is no height or weight on file to calculate BMI.   Patient Active Problem List   Diagnosis Date Noted  . Altered behavior 05/10/2019  . Klebsiella cystitis 04/22/2019  . Metabolic encephalopathy [id number]  . Back pain 04/08/2019  . MCI (mild cognitive impairment) 03/07/2019  . Congestive heart failure (CHF) ([place]) 01/20/2019  . CAD (coronary artery disease) 06/07/2018  . Pulmonary hypertension, primary ([place]) 06/07/2018  . Generalized weakness   . Abnormal LFTs 06/01/2018  . Hallucination 06/01/2018  . Abnormal chest x-ray 09/28/2015  . Edema of right lower extremity 09/28/2015  . Elevated brain natriuretic peptide (BNP) level 09/17/2015  . Seizures ([place]) 09/16/2015  . Asthma 08/14/2015  . PCP NOTES >>>>>>>>>>>>>>>>>>>>>>>>>>>>>>  04/24/2015  . TIA (transient ischemic attack) 12/11/2014  . Dyslipidemia 12/11/2014  . History of CVA (cerebrovascular accident) 12/01/2014  . Thrombocytopenia ([place]) 12/01/2014  . Chronic renal insufficiency, stage II (mild) 11/12/2013  . Impingement syndrome, shoulder, left 11/09/2013  . Acquired short bowel syndrome 11/09/2013  . Annual physical exam 08/30/2013  . Hx pulmonary embolism 06/27/2013  . Warfarin anticoagulation 06/27/2013  . DDD (degenerative disc disease), lumbar 11/18/2012  . ATRIAL FIBRILLATION   07/08/2010  . COLON CANCER, HX OF 07/08/2010  . DIZZINESS 10/18/2009  . Essential hypertension 12/16/2007  . HIP PAIN, CHRONIC 12/28/2006  . ANEMIA-NOS 07/15/2006  . Personal history of venous thrombosis and embolism 07/15/2006    CMP     Component Value Date/Time   NA 147 06/15/2019 0000   NA 142 06/09/2012 1509  K 5.0 06/15/2019 0000   K 3.9 06/09/2012 1509   CL 112 (A) 06/15/2019 0000   CL 108 (H) 06/09/2012 1509   CO2 19 06/15/2019 0000   CO2 26 06/09/2012 1509   GLUCOSE 103 (H) 04/14/2019 0532   GLUCOSE 103 (H) 06/09/2012 1509   BUN 23 (A) 06/15/2019 0000   BUN 17.0 06/09/2012 1509   CREATININE 1.1 06/15/2019 0000   CREATININE 1.13 (H) 04/14/2019 0532   CREATININE 1.51 (H) 07/22/2018 1624   CREATININE 1.2 (H) 06/09/2012 1509   CALCIUM 9.0 06/14/2019 0000   CALCIUM 9.5 06/09/2012 1509   PROT 6.9 04/07/2019 1530   PROT 6.9 01/13/2018 1620   PROT 7.2 06/09/2012 1509   ALBUMIN 4.7 04/07/2019 1530   ALBUMIN 4.6 01/13/2018 1620   ALBUMIN 4.2 06/09/2012 1509   AST 31 04/07/2019 1530   AST 17 06/09/2012 1509   ALT 15 04/07/2019 1530   ALT 11 06/09/2012 1509   ALKPHOS 56 04/07/2019 1530   ALKPHOS 47 06/09/2012 1509   BILITOT 1.4 (H) 04/07/2019 1530   BILITOT 1.1 01/13/2018 1620   BILITOT 0.73 06/09/2012 1509   GFRNONAA 42.27 06/15/2019 0000   GFRAA 48.99 06/15/2019 0000   Recent Labs    01/22/19 0405 01/24/19 0432 04/08/19 0021 04/11/19 0556  04/12/19 0536 04/14/19 0532 04/14/19 0532 05/18/19 0000 06/07/19 0000 06/14/19 0000 06/15/19 0000  NA 142 144 144 148* 147* 145   < > 142 145 150* 147  K 3.2* 4.7 3.5 4.3 4.1 3.8  --  5.1 5.3 4.4 5.0  CL 105 112* 107 111 108 109  --  110* 111* 114* 112*  CO2 27 24 27 27 25 24   --  19 21 22 19   GLUCOSE 92 107* 114* 87 93 103*  --   --   --   --   --   BUN 24* 25* 41* 31* 29* 26*   < > 58* 32* 19 23*  CREATININE 1.41* 1.33* 1.96* 1.28* 1.26* 1.13*   < > 1.6* 1.2* 1.1 1.1  CALCIUM 8.5* 9.4 9.2 9.0 9.3 9.4  --  9.4 8.7 9.0  --   MG 2.1 2.0 2.2  --   --   --   --   --   --   --   --   PHOS 3.4  --  4.0  --   --   --   --   --   --   --   --    < > = values in this interval not displayed.   Recent Labs    01/20/19 0918 03/07/19 1454 04/07/19 1530  AST 25 17 31   ALT 15 10 15   ALKPHOS 41 43 56  BILITOT 0.7 0.8 1.4*  PROT 5.9* 6.6 6.9  ALBUMIN 3.8 4.1 4.7   Recent Labs    03/07/19 1454 04/07/19 1530 04/08/19 0021 04/12/19 0536 05/18/19 0000  WBC 6.3 8.2 6.9 5.3 4.6  NEUTROABS 4.6 6.5  --   --  3  HGB 13.3 13.3 12.7 12.9 12.2  HCT 40.5 41.2 40.0 42.0 36  MCV 93.6 95.2 97.6 100.0  --   PLT 89.0* 75* 76* 70* 84*   No results for input(s): CHOL, LDLCALC, TRIG in the last 8760 hours.  Invalid input(s): HCL No results found for: [facility] Lab Results  Component Value Date   TSH 3.58 08/22/2018   Lab Results  Component Value Date   HGBA1C 5.6 11/13/2013   Lab Results  Component  Value Date   CHOL 97 08/16/2017   HDL 37.70 (L) 08/16/2017   LDLCALC 48 08/16/2017   TRIG 56.0 08/16/2017   CHOLHDL 3 08/16/2017    Significant Diagnostic Results in last 30 days:  No results found.  Assessment and Plan  No problem-specific Assessment & Plan notes found for this encounter.   Labs/tests ordered:    [name] , MD  This encounter was created in error - please disregard.

## 2019-07-03 ENCOUNTER — Non-Acute Institutional Stay (SKILLED_NURSING_FACILITY): Payer: Medicare Other | Admitting: Internal Medicine

## 2019-07-03 ENCOUNTER — Encounter: Payer: Self-pay | Admitting: Internal Medicine

## 2019-07-03 DIAGNOSIS — I10 Essential (primary) hypertension: Secondary | ICD-10-CM

## 2019-07-03 DIAGNOSIS — Z8679 Personal history of other diseases of the circulatory system: Secondary | ICD-10-CM

## 2019-07-03 DIAGNOSIS — I482 Chronic atrial fibrillation, unspecified: Secondary | ICD-10-CM

## 2019-07-03 DIAGNOSIS — E870 Hyperosmolality and hypernatremia: Secondary | ICD-10-CM

## 2019-07-03 DIAGNOSIS — I5032 Chronic diastolic (congestive) heart failure: Secondary | ICD-10-CM | POA: Diagnosis not present

## 2019-07-03 DIAGNOSIS — D696 Thrombocytopenia, unspecified: Secondary | ICD-10-CM

## 2019-07-03 DIAGNOSIS — Z86711 Personal history of pulmonary embolism: Secondary | ICD-10-CM

## 2019-07-03 LAB — BASIC METABOLIC PANEL
BUN: 29 — AB (ref 4–21)
CO2: 23 — AB (ref 13–22)
Chloride: 110 — AB (ref 99–108)
Creatinine: 1.1 (ref 0.5–1.1)
Glucose: 98
Potassium: 4 (ref 3.4–5.3)
Sodium: 143 (ref 137–147)

## 2019-07-03 LAB — COMPREHENSIVE METABOLIC PANEL
Calcium: 9.5 (ref 8.7–10.7)
GFR calc Af Amer: 51.7
GFR calc non Af Amer: 44.61

## 2019-07-03 NOTE — Progress Notes (Signed)
Location:  [facility] Room Number: 212-D Place of Service:  SNF (31)  Paz, [name], MD  Patient Care Team: Colon Branch, MD as PCP - General (Internal Medicine)  Extended Emergency Contact Information Primary Emergency Contact: [name],[name] Address: [address]          [place], [place] [address] [name] of [place] Phone: [phone] Mobile Phone: [phone] Relation: Daughter    Allergies: Patient has no known allergies.  Chief Complaint  Patient presents with  . Medical Management of Chronic Issues    Routine [facility] visit  Medical management of chronic medical conditions including history of hyponatremia-chronic kidney disease-pulmonary embolism on chronic Coumadin-CHF diastolic-coronary artery disease-behavior psychosis-thrombocytopenia-depression-asthma-hypertension-atrial fibrillation  HPI: Patient is a 85 y.o. female who is seen today for management of chronic medical conditions as noted above.  We have seen her recently for intermittent hypernatremia with sodium at times in the lower 150s-at times we will give her a few liters of half-normal saline which tends to normalize her sodium.  This appears to be the case today-we did give her what appears to be a couple liters of IV fluids late last week secondary sodium that appear to be rising at 146-it is now 143 on today's lab.  .  We are holding her Lasix-she does have a history of diastolic CHF but edema and weight appears to be stable despite being off the Lasix now for some time.  She is not complaining of any shortness of breath she does have a history of asthma and does have orders for [facility] as well as albuterol as needed-occasionally will have a little wheezing but this appears to clear apparently with her albuterol  In regards to pulmonary embolism she is on Coumadin INR was 1.3 last week adjustments were made to her Coumadin she is now 2 mg a day and  update INR is pending..  She is also on this for a history of atrial fibrillation she is on Lopressor for rate control and this appears to be stable occasionally will have pulses in the 50s but is asymptomatic of any significant bradycardia-of note she is also on verapamil  She also has a history of coronary artery disease which has been stable for some time she does not really complain of any chest pain again her edema appears to be stable she is on aspirin 81 mg a day Imdur 30 mg a day Lopressor 37.5 mg twice daily and Lipitor 20 mg a day.  At one point she did have some behaviors with psychosis but this is been stable now for some time she is a very pleasant and appropriate when I have seen her recently she does have orders for Seroquel 50 mg at night she also is on low-dose Lexapro 5 mg a day for depression.  Regards to hypertension she is on Lopressor 37.5 mg twice daily as well as verapamil 120 mg a day blood pressures appear relatively stable recently 108/60-110/60-132/68-118/70.  She also has thrombocytopenia and this actually showed improvement on lab done last month at 84,000 previously had run in the 50-60,000 range she does not show any evidence of increased bruising or bleeding she has some bruising of her IV site but this is not new.  She also has significant weakness with degenerative joint disease but appears to be doing relatively well with the Tylenol     Past Medical History:  Diagnosis Date  . Acute encephalopathy   . Acute  renal failure superimposed on stage 3 chronic kidney disease ([place])   . Anemia   . Atrial fibrillation ([place])   . Colon cancer ([place]) 12/11  . Colon cancer ([place]) 2011  . Diarrhea   . DVT (deep venous thrombosis) ([place]) 11/06  . Elevated brain natriuretic peptide (BNP) level   . Frequent headaches   . Heart murmur   . Hyperkalemia   . Hypertension   . Pulmonary embolism ([place]) 11/06  . Seizures ([place])   . Stroke ([place]) 11/30/2014  . Urine retention  01/2019    Past Surgical History:  Procedure Laterality Date  . APPENDECTOMY  1952  . COLECTOMY  06/2010   partial, Dr. Donne Hazel complicated by LLE CVT; S/P IVC umbrella & anemia  . HIP FRACTURE SURGERY  2006   Trauma  . TOTAL ABDOMINAL HYSTERECTOMY W/ BILATERAL SALPINGOOPHORECTOMY  1973   For Fibroids  . TOTAL HIP ARTHROPLASTY  01/03/07   Left hip replacement.  Marland Kitchen VENA CAVA FILTER PLACEMENT  06/28/2010    Allergies as of 07/03/2019   No Known Allergies     Medication List       Accurate as of July 03, 2019 11:25 AM. If you have any questions, ask your nurse or doctor.        acetaminophen 500 MG tablet Commonly known as: TYLENOL Take 1,000 mg by mouth every 8 (eight) hours as needed for headache (pain).   albuterol (2.5 MG/3ML) 0.083% nebulizer solution Commonly known as: PROVENTIL Take 2.5 mg by nebulization every 6 (six) hours as needed for wheezing or shortness of breath.   albuterol 108 (90 Base) MCG/ACT inhaler Commonly known as: VENTOLIN HFA Inhale 2 puffs into the lungs every 6 (six) hours as needed for wheezing or shortness of breath.   aspirin 81 MG chewable tablet Chew 81 mg by mouth daily.   atorvastatin 20 MG tablet Commonly known as: LIPITOR Take 1 tablet (20 mg total) by mouth daily.   calcium-vitamin D 500-200 MG-UNIT Tabs tablet Commonly known as: OSCAL WITH D Take 1 tablet by mouth daily.   Dulera 100-5 MCG/ACT Aero Generic drug: mometasone-formoterol Inhale 2 puffs into the lungs 2 (two) times daily.   Ensure Take 237 mLs by mouth 2 (two) times daily.   escitalopram 5 MG tablet Commonly known as: LEXAPRO Take 5 mg by mouth daily.   isosorbide mononitrate 30 MG 24 hr tablet Commonly known as: IMDUR Take 0.5 tablets (15 mg total) by mouth daily.   loratadine 10 MG tablet Commonly known as: CLARITIN Take 1 tablet (10 mg total) by mouth daily as needed (seasonal allergies).   metoprolol tartrate 25 MG tablet Commonly known as:  LOPRESSOR Take 1.5 tablets (37.5 mg total) by mouth 2 (two) times daily.   multivitamin with minerals Tabs tablet Take 1 tablet by mouth daily.   nystatin powder Commonly known as: MYCOSTATIN/NYSTOP Apply topically daily. Apply under bilateral breasts QD   polyethylene glycol 17 g packet Commonly known as: MIRALAX / GLYCOLAX Take 17 g by mouth daily as needed.   promethazine 12.5 MG tablet Commonly known as: PHENERGAN Take 1 tablet (12.5 mg total) by mouth 3 (three) times daily as needed for nausea or vomiting.   QUEtiapine 25 MG tablet Commonly known as: SEROQUEL Take 2 tablets (50 mg total) by mouth at bedtime.   tamsulosin 0.4 MG Caps capsule Commonly known as: FLOMAX Take 1 capsule (0.4 mg total) by mouth daily.   verapamil 120 MG 24 hr capsule Commonly known as:  VERELAN PM Take 120 mg by mouth at bedtime.   warfarin 2 MG tablet Commonly known as: COUMADIN Take as directed by the anticoagulation clinic. If you are unsure how to take this medication, talk to your nurse or doctor. Original instructions: Take 2 mg by mouth daily. What changed: Another medication with the same name was removed. Continue taking this medication, and follow the directions you see here. Changed by: Granville Lewis, PA-C       No orders of the defined types were placed in this encounter.   Immunization History  Administered Date(s) Administered  . Fluad Quad(high Dose 65+) 03/07/2019  . Influenza Split 03/30/2012  . Influenza Whole 04/30/2006, 04/20/2008, 04/15/2009  . Influenza, High Dose Seasonal PF 04/20/2013, 03/27/2015, 03/31/2016, 04/22/2017, 04/20/2018  . Influenza,inj,Quad PF,6+ Mos 05/23/2014  . Pneumococcal Conjugate-13 04/24/2015  . Pneumococcal Polysaccharide-23 07/06/2009  . Td 10/30/2016    Social History   Tobacco Use  . Smoking status: Passive Smoke Exposure - Never Smoker  . Smokeless tobacco: Never Used  . Tobacco comment: husband smoked in home.   Substance Use  Topics  . Alcohol use: No    Alcohol/week: 0.0 standard drinks    Review of Systems  DATA OBTAINED: from patient, nurse, medical record GENERAL:  no fevers, fatigue, appetite changes weight appears to be stable SKIN: No itching, rash HEENT: No complaint RESPIRATORY: No cough, wheezing, SOB occasionally has some very mild wheezing that apparently resolved with nebulizers CARDIAC: No chest pain, palpitations, lower extremity edema on right appears to be baseline and not increased GI: No abdominal pain, No N/V/D or constipation, No heartburn or reflux  GU: No dysuria, frequency or urgency, or incontinence  MUSCULOSKELETAL: No unrelieved bone/joint pain does have degenerative joint disease NEUROLOGIC: No headache, dizziness continues with lower extremity weakness PSYCHIATRIC: No overt anxiety or sadness  Vitals:   07/03/19 1054  BP: 108/60  Pulse: 61  Resp: 18  Temp: 97.6 F (36.4 C)  SpO2: 96%   Body mass index is 29.72 kg/m. Physical Exam   In general this is a pleasant elderly female in no distress she is bright and alert lying comfortably in bed.  Her skin is warm and dry she does have some covering over her IV site.  Eyes visual acuity appears to be intact sclera and conjunctive are clear.  Oropharynx clear mucous membranes moist.  Chest is clear to auscultation initially there was a slight expiratory wheeze but this cleared with cough.  Heart is irregular irregular rate and rhythm without murmur gallop or rub she has mild baseline right lower extremity edema.  Abdomen is soft nontender with positive bowel sounds.  Musculoskeletal Limited exam since she is in bed but does move all extremities x4 at relative baseline with baseline lower extremity weakness.  Neurologic is grossly intact her speech is clear could not really appreciate lateralizing findings.  Psych she is pleasant and appropriate has some moderate cognitive deficits but follows simple verbal commands  without difficulty she is bright alert pleasant  Patient Active Problem List   Diagnosis Date Noted  . Altered behavior 05/10/2019  . Klebsiella cystitis 04/22/2019  . Metabolic encephalopathy [id number]  . Back pain 04/08/2019  . MCI (mild cognitive impairment) 03/07/2019  . Congestive heart failure (CHF) ([place]) 01/20/2019  . CAD (coronary artery disease) 06/07/2018  . Pulmonary hypertension, primary ([place]) 06/07/2018  . Generalized weakness   . Abnormal LFTs 06/01/2018  . Hallucination 06/01/2018  . Abnormal chest x-ray 09/28/2015  . Edema of  right lower extremity 09/28/2015  . Elevated brain natriuretic peptide (BNP) level 09/17/2015  . Seizures ([place]) 09/16/2015  . Asthma 08/14/2015  . PCP NOTES >>>>>>>>>>>>>>>>>>>>>>>>>>>>>> 04/24/2015  . TIA (transient ischemic attack) 12/11/2014  . Dyslipidemia 12/11/2014  . History of CVA (cerebrovascular accident) 12/01/2014  . Thrombocytopenia ([place]) 12/01/2014  . Chronic renal insufficiency, stage II (mild) 11/12/2013  . Impingement syndrome, shoulder, left 11/09/2013  . Acquired short bowel syndrome 11/09/2013  . Annual physical exam 08/30/2013  . Hx pulmonary embolism 06/27/2013  . Warfarin anticoagulation 06/27/2013  . DDD (degenerative disc disease), lumbar 11/18/2012  . ATRIAL FIBRILLATION   07/08/2010  . COLON CANCER, HX OF 07/08/2010  . DIZZINESS 10/18/2009  . Essential hypertension 12/16/2007  . HIP PAIN, CHRONIC 12/28/2006  . ANEMIA-NOS 07/15/2006  . Personal history of venous thrombosis and embolism 07/15/2006   Labs.  July 03, 2019.  Sodium 143 potassium 4 BUN 28.5 creatinine 1.09.  June 26, 2019  Sodium 145 potassium 4.3 BUN 22.8 creatinine 0.91.  May 18, 2019.  WBC 4.6 hemoglobin 12.2 platelets 84,000  CMP     Component Value Date/Time   NA 146 06/29/2019 0000   NA 142 06/09/2012 1509   K 4.3 06/29/2019 0000   K 3.9 06/09/2012 1509   CL 112 (A) 06/29/2019 0000   CL 108 (H) 06/09/2012 1509     CO2 24 (A) 06/29/2019 0000   CO2 26 06/09/2012 1509   GLUCOSE 103 (H) 04/14/2019 0532   GLUCOSE 103 (H) 06/09/2012 1509   BUN 27 (A) 06/29/2019 0000   BUN 17.0 06/09/2012 1509   CREATININE 1.0 06/29/2019 0000   CREATININE 1.13 (H) 04/14/2019 0532   CREATININE 1.51 (H) 07/22/2018 1624   CREATININE 1.2 (H) 06/09/2012 1509   CALCIUM 9.2 06/29/2019 0000   CALCIUM 9.5 06/09/2012 1509   PROT 6.9 04/07/2019 1530   PROT 6.9 01/13/2018 1620   PROT 7.2 06/09/2012 1509   ALBUMIN 4.7 04/07/2019 1530   ALBUMIN 4.6 01/13/2018 1620   ALBUMIN 4.2 06/09/2012 1509   AST 31 04/07/2019 1530   AST 17 06/09/2012 1509   ALT 15 04/07/2019 1530   ALT 11 06/09/2012 1509   ALKPHOS 56 04/07/2019 1530   ALKPHOS 47 06/09/2012 1509   BILITOT 1.4 (H) 04/07/2019 1530   BILITOT 1.1 01/13/2018 1620   BILITOT 0.73 06/09/2012 1509   GFRNONAA 47.78 06/29/2019 0000   GFRAA 55.37 06/29/2019 0000   Recent Labs    01/22/19 0405 01/24/19 0432 04/08/19 0021 04/11/19 0556 04/12/19 0536 04/12/19 0536 04/14/19 0532 06/23/19 0000 06/26/19 0000 06/29/19 0000  NA 142 144 144 148* 147*   < > 145 144 145 146  K 3.2* 4.7 3.5 4.3 4.1  --  3.8 4.2 4.3 4.3  CL 105 112* 107 111 108  --  109 110* 111* 112*  CO2 27 24 27 27 25   --  24 22 25* 24*  GLUCOSE 92 107* 114* 87 93  --  103*  --   --   --   BUN 24* 25* 41* 31* 29*   < > 26* 21 23* 27*  CREATININE 1.41* 1.33* 1.96* 1.28* 1.26*   < > 1.13* 0.9 0.9 1.0  CALCIUM 8.5* 9.4 9.2 9.0 9.3  --  9.4 8.9 8.9 9.2  MG 2.1 2.0 2.2  --   --   --   --   --   --   --   PHOS 3.4  --  4.0  --   --   --   --   --   --   --    < > =  values in this interval not displayed.   Recent Labs    01/20/19 0918 03/07/19 1454 04/07/19 1530  AST 25 17 31   ALT 15 10 15   ALKPHOS 41 43 56  BILITOT 0.7 0.8 1.4*  PROT 5.9* 6.6 6.9  ALBUMIN 3.8 4.1 4.7   Recent Labs    03/07/19 1454 04/07/19 1530 04/08/19 0021 04/12/19 0536 05/18/19 0000  WBC 6.3 8.2 6.9 5.3 4.6  NEUTROABS 4.6 6.5   --   --  3  HGB 13.3 13.3 12.7 12.9 12.2  HCT 40.5 41.2 40.0 42.0 36  MCV 93.6 95.2 97.6 100.0  --   PLT 89.0* 75* 76* 70* 84*   No results for input(s): CHOL, LDLCALC, TRIG in the last 8760 hours.  Invalid input(s): HCL No results found for: [facility] Lab Results  Component Value Date   TSH 3.58 08/22/2018   Lab Results  Component Value Date   HGBA1C 5.6 11/13/2013   Lab Results  Component Value Date   CHOL 97 08/16/2017   HDL 37.70 (L) 08/16/2017   LDLCALC 48 08/16/2017   TRIG 56.0 08/16/2017   CHOLHDL 3 08/16/2017    Significant Diagnostic Results in last 30 days:  No results found.  Assessment and Plan   #1 history of hypernatremia again this is somewhat intermittent and usually resolves with IV fluids or aggressive fluid encouragement-at this point appears to be normalized-at this point will continue to monitor.  With labs  2.  History of CHF diastolic again her Lasix is on hold because of her sodium concerns-unless her weight edema appears to be stable clinical status appears to be stable without complaints of chest pain or shortness of breath.  3.  History of pulmonary embolism she is on chronic Coumadin INR was low last week at 1.3 adjustments were made she is on Coumadin 2 mg a day now and update INR is pending.  4.  History of atrial fibrillation this appears rate controlled she is on verapamil 120 mg a day as well as Lopressor 37.5 mg twice daily-she again is on Coumadin for anticoagulation.  5.  History of coronary artery disease this appears stable as well without complaints of chest pain she is on aspirin 81 mg a day Imdur 30 mg a day as well as Lopressor 37.5 mg twice daily she is also on Lipitor.  6.  History of hypertension as noted above this appears stable on the Lopressor as well as verapamil.  7.  History of thrombocytopenia last platelet count last month was 84,000 which actually show some improvement at this point will monitor she does not show  evidence of increased bruising or bleeding at this point.--We will update a CBC the next time we draw labs in approximately a week-  8.  History of behaviors with psychosis and depression-she is stable at this point on the Seroquel 50 mg at night she also is on low-dose Lexapro 5 mg a day for depression.  She continues to be pleasant alert-at one point several weeks ago she did have some confusion and agitation but this appeared to be fairly short duration.  9.  History of asthma she continues on Dulera she also has orders for albuterol as needed at times she will have some very slight wheezing but this usually resolves with the albuterol.  10.  History of allergic rhinitis she continues on Claritin 10 mg a day this appears stable and has been for some time.  11.  History of degenerative joint disease  with weakness-she continues to have some weakness but appears to well with supportive care she does have orders for Tylenol for pain 1000 mg every 8 hours as needed.  [id number]

## 2019-07-04 ENCOUNTER — Non-Acute Institutional Stay (SKILLED_NURSING_FACILITY): Payer: Medicare Other | Admitting: Internal Medicine

## 2019-07-04 ENCOUNTER — Other Ambulatory Visit: Payer: Self-pay | Admitting: *Deleted

## 2019-07-04 DIAGNOSIS — I4811 Longstanding persistent atrial fibrillation: Secondary | ICD-10-CM

## 2019-07-04 DIAGNOSIS — Z7901 Long term (current) use of anticoagulants: Secondary | ICD-10-CM | POA: Diagnosis not present

## 2019-07-04 MED ORDER — ATORVASTATIN CALCIUM 20 MG PO TABS: 20.0000 mg | ORAL_TABLET | Freq: Every day | ORAL | 1 refills | Status: DC

## 2019-07-06 DIAGNOSIS — Z23 Encounter for immunization: Secondary | ICD-10-CM | POA: Diagnosis not present

## 2019-07-07 ENCOUNTER — Encounter: Payer: Self-pay | Admitting: Internal Medicine

## 2019-07-07 NOTE — Progress Notes (Signed)
Location:   [facility]:   SNF Provider:Precious Segall D Dnya Hickle MD  Colon Branch, MD  Patient Care Team: Colon Branch, MD as PCP - General (Internal Medicine)  Extended Emergency Contact Information Primary Emergency Contact: [facility] Address: [address]          [place], [place] [address] [name] of [place] Phone: [phone] Mobile Phone: [phone] Relation: Daughter    Allergies: Patient has no known allergies.  Chief Complaint  Patient presents with  . Acute Visit    HPI: Patient is 86 y.o. female who is on Coumadin for atrial fibrillation.  Yesterday patient's INR was 1.7 on 2 mg daily for 1 week.  Past Medical History:  Diagnosis Date  . Acute encephalopathy   . Acute renal failure superimposed on stage 3 chronic kidney disease ([place])   . Anemia   . Atrial fibrillation ([place])   . Colon cancer ([place]) 12/11  . Colon cancer ([place]) 2011  . Diarrhea   . DVT (deep venous thrombosis) ([place]) 11/06  . Elevated brain natriuretic peptide (BNP) level   . Frequent headaches   . Heart murmur   . Hyperkalemia   . Hypertension   . Pulmonary embolism ([place]) 11/06  . Seizures ([place])   . Stroke ([place]) 11/30/2014  . Urine retention 01/2019    Past Surgical History:  Procedure Laterality Date  . APPENDECTOMY  1952  . COLECTOMY  06/2010   partial, Dr. Donne Hazel complicated by LLE CVT; S/P IVC umbrella & anemia  . HIP FRACTURE SURGERY  2006   Trauma  . TOTAL ABDOMINAL HYSTERECTOMY W/ BILATERAL SALPINGOOPHORECTOMY  1973   For Fibroids  . TOTAL HIP ARTHROPLASTY  01/03/07   Left hip replacement.  Marland Kitchen VENA CAVA FILTER PLACEMENT  06/28/2010    Allergies as of 07/04/2019   No Known Allergies     Medication List       Accurate as of July 04, 2019 11:59 PM. If you have any questions, ask your nurse or doctor.        acetaminophen 500 MG tablet Commonly known as: TYLENOL Take 1,000 mg by mouth every 8 (eight) hours as needed for  headache (pain).   albuterol (2.5 MG/3ML) 0.083% nebulizer solution Commonly known as: PROVENTIL Take 2.5 mg by nebulization every 6 (six) hours as needed for wheezing or shortness of breath.   albuterol 108 (90 Base) MCG/ACT inhaler Commonly known as: VENTOLIN HFA Inhale 2 puffs into the lungs every 6 (six) hours as needed for wheezing or shortness of breath.   aspirin 81 MG chewable tablet Chew 81 mg by mouth daily.   atorvastatin 20 MG tablet Commonly known as: LIPITOR Take 1 tablet (20 mg total) by mouth daily.   calcium-vitamin D 500-200 MG-UNIT Tabs tablet Commonly known as: OSCAL WITH D Take 1 tablet by mouth daily.   Dulera 100-5 MCG/ACT Aero Generic drug: mometasone-formoterol Inhale 2 puffs into the lungs 2 (two) times daily.   Ensure Take 237 mLs by mouth 2 (two) times daily.   escitalopram 5 MG tablet Commonly known as: LEXAPRO Take 5 mg by mouth daily.   isosorbide mononitrate 30 MG 24 hr tablet Commonly known as: IMDUR Take 0.5 tablets (15 mg total) by mouth daily.   loratadine 10 MG tablet Commonly known as: CLARITIN Take 1 tablet (10 mg total) by mouth daily as needed (seasonal allergies).   metoprolol tartrate 25 MG tablet Commonly known as: LOPRESSOR Take 1.5 tablets (37.5 mg  total) by mouth 2 (two) times daily.   multivitamin with minerals Tabs tablet Take 1 tablet by mouth daily.   nystatin powder Commonly known as: MYCOSTATIN/NYSTOP Apply topically daily. Apply under bilateral breasts QD   polyethylene glycol 17 g packet Commonly known as: MIRALAX / GLYCOLAX Take 17 g by mouth daily as needed.   promethazine 12.5 MG tablet Commonly known as: PHENERGAN Take 1 tablet (12.5 mg total) by mouth 3 (three) times daily as needed for nausea or vomiting.   QUEtiapine 25 MG tablet Commonly known as: SEROQUEL Take 2 tablets (50 mg total) by mouth at bedtime.   tamsulosin 0.4 MG Caps capsule Commonly known as: FLOMAX Take 1 capsule (0.4 mg  total) by mouth daily.   verapamil 120 MG 24 hr capsule Commonly known as: VERELAN PM Take 120 mg by mouth at bedtime.   warfarin 2 MG tablet Commonly known as: COUMADIN Take as directed by the anticoagulation clinic. If you are unsure how to take this medication, talk to your nurse or doctor. Original instructions: Take 2 mg by mouth daily.       No orders of the defined types were placed in this encounter.   Immunization History  Administered Date(s) Administered  . Fluad Quad(high Dose 65+) 03/07/2019  . Influenza Split 03/30/2012  . Influenza Whole 04/30/2006, 04/20/2008, 04/15/2009  . Influenza, High Dose Seasonal PF 04/20/2013, 03/27/2015, 03/31/2016, 04/22/2017, 04/20/2018  . Influenza,inj,Quad PF,6+ Mos 05/23/2014  . Pneumococcal Conjugate-13 04/24/2015  . Pneumococcal Polysaccharide-23 07/06/2009  . Td 10/30/2016    Social History   Tobacco Use  . Smoking status: Passive Smoke Exposure - Never Smoker  . Smokeless tobacco: Never Used  . Tobacco comment: husband smoked in home.   Substance Use Topics  . Alcohol use: No    Alcohol/week: 0.0 standard drinks     Vitals:   07/07/19 2118  BP: (!) 146/87  Pulse: (!) 58  Resp: 16  Temp: (!) 96.7 F (35.9 C)   There is no height or weight on file to calculate BMI.   Patient Active Problem List   Diagnosis Date Noted  . Altered behavior 05/10/2019  . Klebsiella cystitis 04/22/2019  . Metabolic encephalopathy [id number]  . Back pain 04/08/2019  . MCI (mild cognitive impairment) 03/07/2019  . Congestive heart failure (CHF) ([place]) 01/20/2019  . CAD (coronary artery disease) 06/07/2018  . Pulmonary hypertension, primary ([place]) 06/07/2018  . Generalized weakness   . Abnormal LFTs 06/01/2018  . Hallucination 06/01/2018  . Abnormal chest x-ray 09/28/2015  . Edema of right lower extremity 09/28/2015  . Elevated brain natriuretic peptide (BNP) level 09/17/2015  . Seizures ([place]) 09/16/2015  . Asthma 08/14/2015    . PCP NOTES >>>>>>>>>>>>>>>>>>>>>>>>>>>>>> 04/24/2015  . TIA (transient ischemic attack) 12/11/2014  . Dyslipidemia 12/11/2014  . History of CVA (cerebrovascular accident) 12/01/2014  . Thrombocytopenia ([place]) 12/01/2014  . Chronic renal insufficiency, stage II (mild) 11/12/2013  . Impingement syndrome, shoulder, left 11/09/2013  . Acquired short bowel syndrome 11/09/2013  . Annual physical exam 08/30/2013  . Hx pulmonary embolism 06/27/2013  . Warfarin anticoagulation 06/27/2013  . DDD (degenerative disc disease), lumbar 11/18/2012  . ATRIAL FIBRILLATION   07/08/2010  . COLON CANCER, HX OF 07/08/2010  . DIZZINESS 10/18/2009  . Essential hypertension 12/16/2007  . HIP PAIN, CHRONIC 12/28/2006  . ANEMIA-NOS 07/15/2006  . Personal history of venous thrombosis and embolism 07/15/2006    CMP     Component Value Date/Time   NA 146 06/29/2019 0000  NA 142 06/09/2012 1509   K 4.3 06/29/2019 0000   K 3.9 06/09/2012 1509   CL 112 (A) 06/29/2019 0000   CL 108 (H) 06/09/2012 1509   CO2 24 (A) 06/29/2019 0000   CO2 26 06/09/2012 1509   GLUCOSE 103 (H) 04/14/2019 0532   GLUCOSE 103 (H) 06/09/2012 1509   BUN 27 (A) 06/29/2019 0000   BUN 17.0 06/09/2012 1509   CREATININE 1.0 06/29/2019 0000   CREATININE 1.13 (H) 04/14/2019 0532   CREATININE 1.51 (H) 07/22/2018 1624   CREATININE 1.2 (H) 06/09/2012 1509   CALCIUM 9.2 06/29/2019 0000   CALCIUM 9.5 06/09/2012 1509   PROT 6.9 04/07/2019 1530   PROT 6.9 01/13/2018 1620   PROT 7.2 06/09/2012 1509   ALBUMIN 4.7 04/07/2019 1530   ALBUMIN 4.6 01/13/2018 1620   ALBUMIN 4.2 06/09/2012 1509   AST 31 04/07/2019 1530   AST 17 06/09/2012 1509   ALT 15 04/07/2019 1530   ALT 11 06/09/2012 1509   ALKPHOS 56 04/07/2019 1530   ALKPHOS 47 06/09/2012 1509   BILITOT 1.4 (H) 04/07/2019 1530   BILITOT 1.1 01/13/2018 1620   BILITOT 0.73 06/09/2012 1509   GFRNONAA 47.78 06/29/2019 0000   GFRAA 55.37 06/29/2019 0000   Recent Labs    01/22/19 0405  01/24/19 0432 04/08/19 0021 04/11/19 0556 04/12/19 0536 04/12/19 0536 04/14/19 0532 06/23/19 0000 06/26/19 0000 06/29/19 0000  NA 142 144 144 148* 147*   < > 145 144 145 146  K 3.2* 4.7 3.5 4.3 4.1  --  3.8 4.2 4.3 4.3  CL 105 112* 107 111 108  --  109 110* 111* 112*  CO2 27 24 27 27 25   --  24 22 25* 24*  GLUCOSE 92 107* 114* 87 93  --  103*  --   --   --   BUN 24* 25* 41* 31* 29*   < > 26* 21 23* 27*  CREATININE 1.41* 1.33* 1.96* 1.28* 1.26*   < > 1.13* 0.9 0.9 1.0  CALCIUM 8.5* 9.4 9.2 9.0 9.3  --  9.4 8.9 8.9 9.2  MG 2.1 2.0 2.2  --   --   --   --   --   --   --   PHOS 3.4  --  4.0  --   --   --   --   --   --   --    < > = values in this interval not displayed.   Recent Labs    01/20/19 0918 03/07/19 1454 04/07/19 1530  AST 25 17 31   ALT 15 10 15   ALKPHOS 41 43 56  BILITOT 0.7 0.8 1.4*  PROT 5.9* 6.6 6.9  ALBUMIN 3.8 4.1 4.7   Recent Labs    03/07/19 1454 04/07/19 1530 04/08/19 0021 04/12/19 0536 05/18/19 0000  WBC 6.3 8.2 6.9 5.3 4.6  NEUTROABS 4.6 6.5  --   --  3  HGB 13.3 13.3 12.7 12.9 12.2  HCT 40.5 41.2 40.0 42.0 36  MCV 93.6 95.2 97.6 100.0  --   PLT 89.0* 75* 76* 70* 84*   No results for input(s): CHOL, LDLCALC, TRIG in the last 8760 hours.  Invalid input(s): HCL No results found for: [facility] Lab Results  Component Value Date   TSH 3.58 08/22/2018   Lab Results  Component Value Date   HGBA1C 5.6 11/13/2013   Lab Results  Component Value Date   CHOL 97 08/16/2017   HDL 37.70 (L) 08/16/2017  [place] 48 08/16/2017   TRIG 56.0 08/16/2017   CHOLHDL 3 08/16/2017    Significant Diagnostic Results in last 30 days:  No results found.  Assessment and Plan  Atrial fibrillation/anticoagulated on Coumadin-yesterday patient's INR was 1.7 on 2 mg daily for 1 week; will start 2.5 mg daily 4 days a week and 2 mg 3 days a week with repeat INR in 1 week    [name] , MD

## 2019-07-10 ENCOUNTER — Non-Acute Institutional Stay (SKILLED_NURSING_FACILITY): Payer: Medicare Other | Admitting: Internal Medicine

## 2019-07-10 DIAGNOSIS — I4811 Longstanding persistent atrial fibrillation: Secondary | ICD-10-CM | POA: Diagnosis not present

## 2019-07-10 DIAGNOSIS — Z7901 Long term (current) use of anticoagulants: Secondary | ICD-10-CM

## 2019-07-10 DIAGNOSIS — J069 Acute upper respiratory infection, unspecified: Secondary | ICD-10-CM | POA: Diagnosis not present

## 2019-07-12 DIAGNOSIS — R2681 Unsteadiness on feet: Secondary | ICD-10-CM | POA: Diagnosis not present

## 2019-07-12 DIAGNOSIS — M6281 Muscle weakness (generalized): Secondary | ICD-10-CM | POA: Diagnosis not present

## 2019-07-12 DIAGNOSIS — R41841 Cognitive communication deficit: Secondary | ICD-10-CM | POA: Diagnosis not present

## 2019-07-12 DIAGNOSIS — R1312 Dysphagia, oropharyngeal phase: Secondary | ICD-10-CM | POA: Diagnosis not present

## 2019-07-12 DIAGNOSIS — G9341 Metabolic encephalopathy: Secondary | ICD-10-CM | POA: Diagnosis not present

## 2019-07-13 DIAGNOSIS — R1312 Dysphagia, oropharyngeal phase: Secondary | ICD-10-CM | POA: Diagnosis not present

## 2019-07-13 DIAGNOSIS — R41841 Cognitive communication deficit: Secondary | ICD-10-CM | POA: Diagnosis not present

## 2019-07-13 DIAGNOSIS — G9341 Metabolic encephalopathy: Secondary | ICD-10-CM | POA: Diagnosis not present

## 2019-07-13 DIAGNOSIS — M6281 Muscle weakness (generalized): Secondary | ICD-10-CM | POA: Diagnosis not present

## 2019-07-13 DIAGNOSIS — R2681 Unsteadiness on feet: Secondary | ICD-10-CM | POA: Diagnosis not present

## 2019-07-14 DIAGNOSIS — G9341 Metabolic encephalopathy: Secondary | ICD-10-CM | POA: Diagnosis not present

## 2019-07-14 DIAGNOSIS — R2681 Unsteadiness on feet: Secondary | ICD-10-CM | POA: Diagnosis not present

## 2019-07-14 DIAGNOSIS — D649 Anemia, unspecified: Secondary | ICD-10-CM | POA: Diagnosis not present

## 2019-07-14 DIAGNOSIS — R1312 Dysphagia, oropharyngeal phase: Secondary | ICD-10-CM | POA: Diagnosis not present

## 2019-07-14 DIAGNOSIS — R41841 Cognitive communication deficit: Secondary | ICD-10-CM | POA: Diagnosis not present

## 2019-07-14 DIAGNOSIS — I10 Essential (primary) hypertension: Secondary | ICD-10-CM | POA: Diagnosis not present

## 2019-07-14 DIAGNOSIS — M6281 Muscle weakness (generalized): Secondary | ICD-10-CM | POA: Diagnosis not present

## 2019-07-14 LAB — BASIC METABOLIC PANEL
BUN: 19 (ref 4–21)
CO2: 18 (ref 13–22)
Chloride: 113 — AB (ref 99–108)
Creatinine: 0.9 (ref 0.5–1.1)
Glucose: 82
Potassium: 4.7 (ref 3.4–5.3)
Sodium: 146 (ref 137–147)

## 2019-07-14 LAB — COMPREHENSIVE METABOLIC PANEL
Calcium: 9.7 (ref 8.7–10.7)
GFR calc Af Amer: 67.89
GFR calc non Af Amer: 58.58

## 2019-07-14 LAB — CBC: RBC: 4.01 (ref 3.87–5.11)

## 2019-07-14 LAB — CBC AND DIFFERENTIAL
HCT: 37 (ref 36–46)
Hemoglobin: 12.4 (ref 12.0–16.0)
Neutrophils Absolute: 2
Platelets: 60 — AB (ref 150–399)
WBC: 3.6

## 2019-07-16 ENCOUNTER — Encounter: Payer: Self-pay | Admitting: Internal Medicine

## 2019-07-16 NOTE — Progress Notes (Signed)
Location:   [facility]:   SNF Provider:Travonte Byard D [name] Hendricks  [name]  Patient Care Team: [name] as PCP - General (Internal Medicine)  Extended Emergency Contact Information Primary Emergency Contact: [facility] Address: [address]          [place], [place] [address] [name] of [place] Phone: [phone] Mobile Phone: [phone] Relation: Daughter    Allergies: Patient has no known allergies.  Chief Complaint  Patient presents with  . Acute Visit    HPI: Patient is 86 y.o. female with atrial fibrillation who is on Coumadin who is being seen for an INR check.  Patient's INR is 1.6 on 2.5 mg Tuesday Thursday Saturday Sunday and 2 mg on Monday Wednesday Friday.  Patient's INR is not therapeutic.  Past Medical History:  Diagnosis Date  . Acute encephalopathy   . Acute renal failure superimposed on stage 3 chronic kidney disease ([place])   . Anemia   . Atrial fibrillation ([place])   . Megan cancer ([place]) 12/11  . Megan cancer ([place]) 2011  . Diarrhea   . DVT (deep venous thrombosis) ([place]) 11/06  . Elevated brain natriuretic peptide (BNP) level   . Frequent headaches   . Heart murmur   . Hyperkalemia   . Hypertension   . Pulmonary embolism ([place]) 11/06  . Seizures ([place])   . Stroke ([place]) 11/30/2014  . Urine retention 01/2019    Past Surgical History:  Procedure Laterality Date  . APPENDECTOMY  1952  . COLECTOMY  06/2010   partial, Dr. Donne Hazel complicated by LLE CVT; S/P IVC umbrella & anemia  . HIP FRACTURE SURGERY  2006   Trauma  . TOTAL ABDOMINAL HYSTERECTOMY W/ BILATERAL SALPINGOOPHORECTOMY  1973   For Fibroids  . TOTAL HIP ARTHROPLASTY  01/03/07   Left hip replacement.  Marland Kitchen VENA CAVA FILTER PLACEMENT  06/28/2010    Allergies as of 07/10/2019   No Known Allergies     Medication List       Accurate as of July 10, 2019 11:59 PM. If you have any questions, ask your nurse or doctor.          acetaminophen 500 MG tablet Commonly known as: TYLENOL Take 1,000 mg by mouth every 8 (eight) hours as needed for headache (pain).   albuterol (2.5 MG/3ML) 0.083% nebulizer solution Commonly known as: PROVENTIL Take 2.5 mg by nebulization every 6 (six) hours as needed for wheezing or shortness of breath.   albuterol 108 (90 Base) MCG/ACT inhaler Commonly known as: VENTOLIN HFA Inhale 2 puffs into the lungs every 6 (six) hours as needed for wheezing or shortness of breath.   aspirin 81 MG chewable tablet Chew 81 mg by mouth daily.   atorvastatin 20 MG tablet Commonly known as: LIPITOR Take 1 tablet (20 mg total) by mouth daily.   calcium-vitamin D 500-200 MG-UNIT Tabs tablet Commonly known as: OSCAL WITH D Take 1 tablet by mouth daily.   Dulera 100-5 MCG/ACT Aero Generic drug: mometasone-formoterol Inhale 2 puffs into the lungs 2 (two) times daily.   Ensure Take 237 mLs by mouth 2 (two) times daily.   escitalopram 5 MG tablet Commonly known as: LEXAPRO Take 5 mg by mouth daily.   isosorbide mononitrate 30 MG 24 hr tablet Commonly known as: IMDUR Take 0.5 tablets (15 mg total) by mouth daily.   loratadine 10 MG tablet Commonly known as: CLARITIN Take 1 tablet (10 mg total) by mouth  daily as needed (seasonal allergies).   metoprolol tartrate 25 MG tablet Commonly known as: LOPRESSOR Take 1.5 tablets (37.5 mg total) by mouth 2 (two) times daily.   multivitamin with minerals Tabs tablet Take 1 tablet by mouth daily.   nystatin powder Commonly known as: MYCOSTATIN/NYSTOP Apply topically daily. Apply under bilateral breasts QD   polyethylene glycol 17 g packet Commonly known as: MIRALAX / GLYCOLAX Take 17 g by mouth daily as needed.   promethazine 12.5 MG tablet Commonly known as: PHENERGAN Take 1 tablet (12.5 mg total) by mouth 3 (three) times daily as needed for nausea or vomiting.   QUEtiapine 25 MG tablet Commonly known as: SEROQUEL Take 2 tablets (50 mg  total) by mouth at bedtime.   tamsulosin 0.4 MG Caps capsule Commonly known as: FLOMAX Take 1 capsule (0.4 mg total) by mouth daily.   verapamil 120 MG 24 hr capsule Commonly known as: VERELAN PM Take 120 mg by mouth at bedtime.   warfarin 2 MG tablet Commonly known as: COUMADIN Take as directed by the anticoagulation clinic. If you are unsure how to take this medication, talk to your nurse or doctor. Original instructions: Take 2 mg by mouth daily.       No orders of the defined types were placed in this encounter.   Immunization History  Administered Date(s) Administered  . Fluad Quad(high Dose 65+) 03/07/2019  . Influenza Split 03/30/2012  . Influenza Whole 04/30/2006, 04/20/2008, 04/15/2009  . Influenza, High Dose Seasonal PF 04/20/2013, 03/27/2015, 03/31/2016, 04/22/2017, 04/20/2018  . Influenza,inj,Quad PF,6+ Mos 05/23/2014  . Pneumococcal Conjugate-13 04/24/2015  . Pneumococcal Polysaccharide-23 07/06/2009  . Td 10/30/2016    Social History   Tobacco Use  . Smoking status: Passive Smoke Exposure - Never Smoker  . Smokeless tobacco: Never Used  . Tobacco comment: husband smoked in home.   Substance Use Topics  . Alcohol use: No    Alcohol/week: 0.0 standard drinks     Vitals:   07/16/19 0012  BP: (!) 116/55  Pulse: (!) 52  Resp: 17  Temp: 97.7 F (36.5 C)   Body mass index is 29 kg/m.   Patient Active Problem List   Diagnosis Date Noted  . Altered behavior 05/10/2019  . Klebsiella cystitis 04/22/2019  . Metabolic encephalopathy A999333  . Back pain 04/08/2019  . MCI (mild cognitive impairment) 03/07/2019  . Congestive heart failure (CHF) ([place]) 01/20/2019  . CAD (coronary artery disease) 06/07/2018  . Pulmonary hypertension, primary ([place]) 06/07/2018  . Generalized weakness   . Abnormal LFTs 06/01/2018  . Hallucination 06/01/2018  . Abnormal chest x-ray 09/28/2015  . Edema of right lower extremity 09/28/2015  . Elevated brain natriuretic  peptide (BNP) level 09/17/2015  . Seizures ([place]) 09/16/2015  . Asthma 08/14/2015  . PCP NOTES >>>>>>>>>>>>>>>>>>>>>>>>>>>>>> 04/24/2015  . TIA (transient ischemic attack) 12/11/2014  . Dyslipidemia 12/11/2014  . History of CVA (cerebrovascular accident) 12/01/2014  . Thrombocytopenia ([place]) 12/01/2014  . Chronic renal insufficiency, stage II (mild) 11/12/2013  . Impingement syndrome, shoulder, left 11/09/2013  . Acquired short bowel syndrome 11/09/2013  . Annual physical exam 08/30/2013  . Hx pulmonary embolism 06/27/2013  . Warfarin anticoagulation 06/27/2013  . DDD (degenerative disc disease), lumbar 11/18/2012  . ATRIAL FIBRILLATION   07/08/2010  . Megan CANCER, HX OF 07/08/2010  . DIZZINESS 10/18/2009  . Essential hypertension 12/16/2007  . HIP PAIN, CHRONIC 12/28/2006  . ANEMIA-NOS 07/15/2006  . Personal history of venous thrombosis and embolism 07/15/2006    CMP  Component Value Date/Time   NA 146 06/29/2019 0000   NA 142 06/09/2012 1509   K 4.3 06/29/2019 0000   K 3.9 06/09/2012 1509   CL 112 (A) 06/29/2019 0000   CL 108 (H) 06/09/2012 1509   CO2 24 (A) 06/29/2019 0000   CO2 26 06/09/2012 1509   GLUCOSE 103 (H) 04/14/2019 0532   GLUCOSE 103 (H) 06/09/2012 1509   BUN 27 (A) 06/29/2019 0000   BUN 17.0 06/09/2012 1509   CREATININE 1.0 06/29/2019 0000   CREATININE 1.13 (H) 04/14/2019 0532   CREATININE 1.51 (H) 07/22/2018 1624   CREATININE 1.2 (H) 06/09/2012 1509   CALCIUM 9.2 06/29/2019 0000   CALCIUM 9.5 06/09/2012 1509   PROT 6.9 04/07/2019 1530   PROT 6.9 01/13/2018 1620   PROT 7.2 06/09/2012 1509   ALBUMIN 4.7 04/07/2019 1530   ALBUMIN 4.6 01/13/2018 1620   ALBUMIN 4.2 06/09/2012 1509   AST 31 04/07/2019 1530   AST 17 06/09/2012 1509   ALT 15 04/07/2019 1530   ALT 11 06/09/2012 1509   ALKPHOS 56 04/07/2019 1530   ALKPHOS 47 06/09/2012 1509   BILITOT 1.4 (H) 04/07/2019 1530   BILITOT 1.1 01/13/2018 1620   BILITOT 0.73 06/09/2012 1509   GFRNONAA  47.78 06/29/2019 0000   GFRAA 55.37 06/29/2019 0000   Recent Labs    01/22/19 0405 01/24/19 0432 04/08/19 0021 04/11/19 0556 04/12/19 0536 04/12/19 0536 04/14/19 0532 06/23/19 0000 06/26/19 0000 06/29/19 0000  NA 142 144 144 148* 147*   < > 145 144 145 146  K 3.2* 4.7 3.5 4.3 4.1  --  3.8 4.2 4.3 4.3  CL 105 112* 107 111 108  --  109 110* 111* 112*  CO2 27 24 27 27 25   --  24 22 25* 24*  GLUCOSE 92 107* 114* 87 93  --  103*  --   --   --   BUN 24* 25* 41* 31* 29*   < > 26* 21 23* 27*  CREATININE 1.41* 1.33* 1.96* 1.28* 1.26*   < > 1.13* 0.9 0.9 1.0  CALCIUM 8.5* 9.4 9.2 9.0 9.3  --  9.4 8.9 8.9 9.2  MG 2.1 2.0 2.2  --   --   --   --   --   --   --   PHOS 3.4  --  4.0  --   --   --   --   --   --   --    < > = values in this interval not displayed.   Recent Labs    01/20/19 0918 03/07/19 1454 04/07/19 1530  AST 25 17 31   ALT 15 10 15   ALKPHOS 41 43 56  BILITOT 0.7 0.8 1.4*  PROT 5.9* 6.6 6.9  ALBUMIN 3.8 4.1 4.7   Recent Labs    03/07/19 1454 04/07/19 1530 04/08/19 0021 04/12/19 0536 05/18/19 0000  WBC 6.3 8.2 6.9 5.3 4.6  NEUTROABS 4.6 6.5  --   --  3  HGB 13.3 13.3 12.7 12.9 12.2  HCT 40.5 41.2 40.0 42.0 36  MCV 93.6 95.2 97.6 100.0  --   PLT 89.0* 75* 76* 70* 84*   No results for input(s): CHOL, LDLCALC, TRIG in the last 8760 hours.  Invalid input(s): HCL No results found for: [facility] Lab Results  Component Value Date   TSH 3.58 08/22/2018   Lab Results  Component Value Date   HGBA1C 5.6 11/13/2013   Lab Results  Component Value Date  CHOL 97 08/16/2017   HDL 37.70 (L) 08/16/2017   LDLCALC 48 08/16/2017   TRIG 56.0 08/16/2017   CHOLHDL 3 08/16/2017    Significant Diagnostic Results in last 30 days:  No results found.  Assessment and Plan  Atrial fibrillation/anticoagulated on Coumadin-patient's INR is 1.6 on Coumadin 2.5 mg Tuesday Thursday Saturday Sunday and 2 mg on Monday Wednesday Friday.  This will be changed to Coumadin 2.5 mg  daily and recheck INR in 1 week    [name] , [place]ricks

## 2019-07-17 DIAGNOSIS — M6281 Muscle weakness (generalized): Secondary | ICD-10-CM | POA: Diagnosis not present

## 2019-07-17 DIAGNOSIS — R2681 Unsteadiness on feet: Secondary | ICD-10-CM | POA: Diagnosis not present

## 2019-07-17 DIAGNOSIS — G9341 Metabolic encephalopathy: Secondary | ICD-10-CM | POA: Diagnosis not present

## 2019-07-17 DIAGNOSIS — R41841 Cognitive communication deficit: Secondary | ICD-10-CM | POA: Diagnosis not present

## 2019-07-17 DIAGNOSIS — R1312 Dysphagia, oropharyngeal phase: Secondary | ICD-10-CM | POA: Diagnosis not present

## 2019-07-18 DIAGNOSIS — R1312 Dysphagia, oropharyngeal phase: Secondary | ICD-10-CM | POA: Diagnosis not present

## 2019-07-18 DIAGNOSIS — M6281 Muscle weakness (generalized): Secondary | ICD-10-CM | POA: Diagnosis not present

## 2019-07-18 DIAGNOSIS — R2681 Unsteadiness on feet: Secondary | ICD-10-CM | POA: Diagnosis not present

## 2019-07-18 DIAGNOSIS — R41841 Cognitive communication deficit: Secondary | ICD-10-CM | POA: Diagnosis not present

## 2019-07-18 DIAGNOSIS — G9341 Metabolic encephalopathy: Secondary | ICD-10-CM | POA: Diagnosis not present

## 2019-07-19 DIAGNOSIS — R41841 Cognitive communication deficit: Secondary | ICD-10-CM | POA: Diagnosis not present

## 2019-07-19 DIAGNOSIS — G9341 Metabolic encephalopathy: Secondary | ICD-10-CM | POA: Diagnosis not present

## 2019-07-19 DIAGNOSIS — R2681 Unsteadiness on feet: Secondary | ICD-10-CM | POA: Diagnosis not present

## 2019-07-19 DIAGNOSIS — R1312 Dysphagia, oropharyngeal phase: Secondary | ICD-10-CM | POA: Diagnosis not present

## 2019-07-19 DIAGNOSIS — M6281 Muscle weakness (generalized): Secondary | ICD-10-CM | POA: Diagnosis not present

## 2019-07-20 ENCOUNTER — Non-Acute Institutional Stay (SKILLED_NURSING_FACILITY): Payer: Medicare Other | Admitting: Internal Medicine

## 2019-07-20 DIAGNOSIS — R41841 Cognitive communication deficit: Secondary | ICD-10-CM | POA: Diagnosis not present

## 2019-07-20 DIAGNOSIS — E870 Hyperosmolality and hypernatremia: Secondary | ICD-10-CM

## 2019-07-20 DIAGNOSIS — M6281 Muscle weakness (generalized): Secondary | ICD-10-CM | POA: Diagnosis not present

## 2019-07-20 DIAGNOSIS — G9341 Metabolic encephalopathy: Secondary | ICD-10-CM | POA: Diagnosis not present

## 2019-07-20 DIAGNOSIS — Z7901 Long term (current) use of anticoagulants: Secondary | ICD-10-CM

## 2019-07-20 DIAGNOSIS — Z5181 Encounter for therapeutic drug level monitoring: Secondary | ICD-10-CM

## 2019-07-20 DIAGNOSIS — R2681 Unsteadiness on feet: Secondary | ICD-10-CM | POA: Diagnosis not present

## 2019-07-20 DIAGNOSIS — I4811 Longstanding persistent atrial fibrillation: Secondary | ICD-10-CM | POA: Diagnosis not present

## 2019-07-20 DIAGNOSIS — R1312 Dysphagia, oropharyngeal phase: Secondary | ICD-10-CM | POA: Diagnosis not present

## 2019-07-20 NOTE — Progress Notes (Signed)
This is an acute visit.  Level of care skilled.  [facility]ility [facility]  Chief complaint acute visit secondary to anticoagulation management with elevated INR.  History of present illness.  Patient is a pleasant 86 year old female who is on chronic anticoagulation with a history of atrial fibrillation --INR was 3.5 on January 13 and Coumadin is being held with an update pending for tomorrow She does not have any increased bruising or bleeding she does not really have any complaints today.  She does have a history of intermittent hypernatremia which usually responds to fluids.  Last sodium on January 8 was 146.  Staff is encouraging fluids.  Past Medical History:  Diagnosis Date  . Acute encephalopathy   . Acute renal failure superimposed on stage 3 chronic kidney disease ([place])   . Anemia   . Atrial fibrillation ([place])   . Colon cancer ([place]) 12/11  . Colon cancer ([place]) 2011  . Diarrhea   . DVT (deep venous thrombosis) ([place]) 11/06  . Elevated brain natriuretic peptide (BNP) level   . Frequent headaches   . Heart murmur   . Hyperkalemia   . Hypertension   . Pulmonary embolism ([place]) 11/06  . Seizures ([place])   . Stroke ([place]) 11/30/2014  . Urine retention 01/2019         Past Surgical History:  Procedure Laterality Date  . APPENDECTOMY  1952  . COLECTOMY  06/2010   partial, Dr. Donne Hazel complicated by LLE CVT; S/P IVC umbrella & anemia  . HIP FRACTURE SURGERY  2006   Trauma  . TOTAL ABDOMINAL HYSTERECTOMY W/ BILATERAL SALPINGOOPHORECTOMY  1973   For Fibroids  . TOTAL HIP ARTHROPLASTY  01/03/07   Left hip replacement.  Marland Kitchen VENA CAVA FILTER PLACEMENT  06/28/2010    Allergies as of 07/10/2019   No Known Allergies        Medication List       r.        acetaminophen 500 MG tablet Commonly known as: TYLENOL Take 1,000 mg by mouth every 8 (eight) hours as needed for headache (pain).   albuterol (2.5 MG/3ML) 0.083% nebulizer  solution Commonly known as: PROVENTIL Take 2.5 mg by nebulization every 6 (six) hours as needed for wheezing or shortness of breath.   albuterol 108 (90 Base) MCG/ACT inhaler Commonly known as: VENTOLIN HFA Inhale 2 puffs into the lungs every 6 (six) hours as needed for wheezing or shortness of breath.   aspirin 81 MG chewable tablet Chew 81 mg by mouth daily.   atorvastatin 20 MG tablet Commonly known as: LIPITOR Take 1 tablet (20 mg total) by mouth daily.   calcium-vitamin D 500-200 MG-UNIT Tabs tablet Commonly known as: OSCAL WITH D Take 1 tablet by mouth daily.   Dulera 100-5 MCG/ACT Aero Generic drug: mometasone-formoterol Inhale 2 puffs into the lungs 2 (two) times daily.   Ensure Take 237 mLs by mouth 2 (two) times daily.   escitalopram 5 MG tablet Commonly known as: LEXAPRO Take 5 mg by mouth daily.   isosorbide mononitrate 30 MG 24 hr tablet Commonly known as: IMDUR Take 0.5 tablets (15 mg total) by mouth daily.   loratadine 10 MG tablet Commonly known as: CLARITIN Take 1 tablet (10 mg total) by mouth daily as needed (seasonal allergies).   metoprolol tartrate 25 MG tablet Commonly known as: LOPRESSOR Take 1.5 tablets (37.5 mg total) by mouth 2 (two) times daily.   multivitamin with minerals Tabs tablet Take 1 tablet by mouth  daily.   nystatin powder Commonly known as: MYCOSTATIN/NYSTOP Apply topically daily. Apply under bilateral breasts QD   polyethylene glycol 17 g packet Commonly known as: MIRALAX / GLYCOLAX Take 17 g by mouth daily as needed.   promethazine 12.5 MG tablet Commonly known as: PHENERGAN Take 1 tablet (12.5 mg total) by mouth 3 (three) times daily as needed for nausea or vomiting.   QUEtiapine 25 MG tablet Commonly known as: SEROQUEL Take 2 tablets (50 mg total) by mouth at bedtime.   tamsulosin 0.4 MG Caps capsule Commonly known as: FLOMAX Take 1 capsule (0.4 mg total) by mouth daily.   verapamil 120 MG 24  hr capsule Commonly known as: VERELAN PM Take 120 mg by mouth at bedtime.   warfarin 2 MG tablet Commonly known as: COUMADIN Take as directed by the anticoagulation clinic. If you are unsure how to take this medication, talk to your nurse or doctor. Original instructions: Take 2 mg by mouth daily.       No orders of the defined types were placed in this encounter.       Immunization History  Administered Date(s) Administered  . Fluad Quad(high Dose 65+) 03/07/2019  . Influenza Split 03/30/2012  . Influenza Whole 04/30/2006, 04/20/2008, 04/15/2009  . Influenza, High Dose Seasonal PF 04/20/2013, 03/27/2015, 03/31/2016, 04/22/2017, 04/20/2018  . Influenza,inj,Quad PF,6+ Mos 05/23/2014  . Pneumococcal Conjugate-13 04/24/2015  . Pneumococcal Polysaccharide-23 07/06/2009  . Td 10/30/2016    Social History        Tobacco Use  . Smoking status: Passive Smoke Exposure - Never Smoker  . Smokeless tobacco: Never Used  . Tobacco comment: husband smoked in home.   Substance Use Topics  . Alcohol use: No    Alcohol/week: 0.0 standard drinks   Review of systems-this is somewhat limited since patient is a poor historian nursing does not report any issues.  In general no complaints of fever or chills.  Skin does not report any increased bruising or bleeding.  Head ears eyes nose mouth and throat is not complain of visual changes or sore throat.  Respiratory denies shortness of breath or cough.  Cardiac does not complain of chest pain or increased edema.  GU no complaints of dysuria.  Musculoskeletal is not complaining of any joint pain this evening.  Neurologic does not complain of dizziness or headache or syncope.  And psych does have some cognitive deficits and at times some agitation but this is been stable now for some time.  Physical exam.  Temperature is 98.1 pulse 58 respiration 17 blood pressure 111/61.  In general this is a pleasant elderly female  in no distress lying in bed comfortably.  Her skin is warm and dry I do not note any increased bruising or bleeding.  Eyes visual acuity appears to be intact sclera and conjunctive are clear.  Oropharynx is clear mucous membranes moist.  Chest is clear to auscultation there is no labored breathing she does have somewhat shallow air entry.  Heart is irregular irregular rate and rhythm without murmur gallop or rub she has trace lower extremity edema which is baseline for her.  Abdomen is soft nontender with positive bowel sounds.  Musculoskeletal continues with lower extremity weakness does move her extremities at baseline-.  Neurologic is grossly intact her speech is clear.  Psych she is pleasant appropriate   Labs.  July 19, 2019-INR was 3.5.  July 14, 2019.  Sodium 146 potassium 4.7 BUN 19.4 creatinine 0.87.  WBCs 3.6 hemoglobin 12.4  platelets 60,000.  Assessment and plan.  1.  Anticoagulation management with history of atrial fibrillation-this appears rate controlled on verapamil and Lopressor-occasionally's pulse is in the 50s but this does not appear to be symptomatic at this point will monitor we will hold her Coumadin as noted above and will update an INR tomorrow she had been on 2.5 mg a day.  2.-History of hypernatremia-this is been somewhat of a persistent issue will update a metabolic panel-at this point continue to encourage fluids.  [id number]

## 2019-07-21 DIAGNOSIS — R41841 Cognitive communication deficit: Secondary | ICD-10-CM | POA: Diagnosis not present

## 2019-07-21 DIAGNOSIS — M6281 Muscle weakness (generalized): Secondary | ICD-10-CM | POA: Diagnosis not present

## 2019-07-21 DIAGNOSIS — R2681 Unsteadiness on feet: Secondary | ICD-10-CM | POA: Diagnosis not present

## 2019-07-21 DIAGNOSIS — G9341 Metabolic encephalopathy: Secondary | ICD-10-CM | POA: Diagnosis not present

## 2019-07-21 DIAGNOSIS — D649 Anemia, unspecified: Secondary | ICD-10-CM | POA: Diagnosis not present

## 2019-07-21 DIAGNOSIS — I10 Essential (primary) hypertension: Secondary | ICD-10-CM | POA: Diagnosis not present

## 2019-07-21 DIAGNOSIS — I5032 Chronic diastolic (congestive) heart failure: Secondary | ICD-10-CM | POA: Diagnosis not present

## 2019-07-21 DIAGNOSIS — R1312 Dysphagia, oropharyngeal phase: Secondary | ICD-10-CM | POA: Diagnosis not present

## 2019-07-23 ENCOUNTER — Encounter: Payer: Self-pay | Admitting: Internal Medicine

## 2019-07-23 DIAGNOSIS — M6281 Muscle weakness (generalized): Secondary | ICD-10-CM | POA: Diagnosis not present

## 2019-07-23 DIAGNOSIS — R1312 Dysphagia, oropharyngeal phase: Secondary | ICD-10-CM | POA: Diagnosis not present

## 2019-07-23 DIAGNOSIS — R2681 Unsteadiness on feet: Secondary | ICD-10-CM | POA: Diagnosis not present

## 2019-07-23 DIAGNOSIS — G9341 Metabolic encephalopathy: Secondary | ICD-10-CM | POA: Diagnosis not present

## 2019-07-23 DIAGNOSIS — M79645 Pain in left finger(s): Secondary | ICD-10-CM | POA: Diagnosis not present

## 2019-07-23 DIAGNOSIS — R600 Localized edema: Secondary | ICD-10-CM | POA: Diagnosis not present

## 2019-07-23 DIAGNOSIS — R41841 Cognitive communication deficit: Secondary | ICD-10-CM | POA: Diagnosis not present

## 2019-07-24 ENCOUNTER — Encounter: Payer: Self-pay | Admitting: Internal Medicine

## 2019-07-24 ENCOUNTER — Non-Acute Institutional Stay (SKILLED_NURSING_FACILITY): Payer: Medicare Other | Admitting: Internal Medicine

## 2019-07-24 DIAGNOSIS — M1120 Other chondrocalcinosis, unspecified site: Secondary | ICD-10-CM | POA: Diagnosis not present

## 2019-07-24 DIAGNOSIS — G9341 Metabolic encephalopathy: Secondary | ICD-10-CM | POA: Diagnosis not present

## 2019-07-24 DIAGNOSIS — E870 Hyperosmolality and hypernatremia: Secondary | ICD-10-CM

## 2019-07-24 DIAGNOSIS — D649 Anemia, unspecified: Secondary | ICD-10-CM | POA: Diagnosis not present

## 2019-07-24 DIAGNOSIS — R2681 Unsteadiness on feet: Secondary | ICD-10-CM | POA: Diagnosis not present

## 2019-07-24 DIAGNOSIS — I5032 Chronic diastolic (congestive) heart failure: Secondary | ICD-10-CM | POA: Diagnosis not present

## 2019-07-24 DIAGNOSIS — M6281 Muscle weakness (generalized): Secondary | ICD-10-CM | POA: Diagnosis not present

## 2019-07-24 DIAGNOSIS — R41841 Cognitive communication deficit: Secondary | ICD-10-CM | POA: Diagnosis not present

## 2019-07-24 DIAGNOSIS — I10 Essential (primary) hypertension: Secondary | ICD-10-CM | POA: Diagnosis not present

## 2019-07-24 DIAGNOSIS — R1312 Dysphagia, oropharyngeal phase: Secondary | ICD-10-CM | POA: Diagnosis not present

## 2019-07-24 LAB — BASIC METABOLIC PANEL
BUN: 15 (ref 4–21)
CO2: 23 — AB (ref 13–22)
Chloride: 112 — AB (ref 99–108)
Creatinine: 0.8 (ref 0.5–1.1)
Glucose: 93
Potassium: 4.4 (ref 3.4–5.3)
Sodium: 146 (ref 137–147)

## 2019-07-24 LAB — COMPREHENSIVE METABOLIC PANEL
Calcium: 9.3 (ref 8.7–10.7)
GFR calc Af Amer: 74
GFR calc non Af Amer: 63.85

## 2019-07-24 NOTE — Progress Notes (Signed)
Location:    [facility] Room Number: 212/D Place of Service:  SNF [id number]) Provider:  Norma Fredrickson, MD  Patient Care Team: Colon Branch, MD as PCP - General (Internal Medicine)  Extended Emergency Contact Information Primary Emergency Contact: [name],[name] Address: [address]          [place], [place] [address] [name] Phone: [phone] Mobile Phone: [phone] Relation: Daughter  Code Status:  Full Code Goals of care: Advanced Directive information Advanced Directives 07/24/2019  Does Patient Have a Medical Advance Directive? Yes  Type of Advance Directive (No Data)  Does patient want to make changes to medical advance directive? No - Patient declined  Copy of [place] in Chart? -  Would patient like information on creating a medical advance directive? -     Chief Complaint  Patient presents with  . Acute Visit    Left Fingers Pain    HPI:  Pt is a 86 y.o. female seen today for an acute visit for a swollen left middle finger.  Apparently this was noted this weekend-patient denied any history of trauma to the finger.  X-ray was obtained which shows erosive arthropathy with pronounced soft tissue swelling of the third digit-suggestion of an inflammatory component. .  Vital signs are stable she is afebrile-the pain appears to be more associated with palpation and movement.  We have also ordered a metabolic panel on her with a history of intermittent hypernatremia sodium was 148 on Friday and she has received some IV half-normal saline.  Update BMP is pending.  Currently she is sitting in her wheelchair comfortably does not really have any complaints but does have some pain when that left middle finger is palpated or moved.     Past Medical History:  Diagnosis Date  . Acute encephalopathy   . Acute renal failure superimposed on stage 3 chronic kidney disease ([place])   .  Anemia   . Atrial fibrillation ([place])   . Colon cancer ([place]) 12/11  . Colon cancer ([place]) 2011  . Diarrhea   . DVT (deep venous thrombosis) ([place]) 11/06  . Elevated brain natriuretic peptide (BNP) level   . Frequent headaches   . Heart murmur   . Hyperkalemia   . Hypertension   . Pulmonary embolism ([place]) 11/06  . Seizures ([place])   . Stroke ([place]) 11/30/2014  . Urine retention 01/2019   Past Surgical History:  Procedure Laterality Date  . APPENDECTOMY  1952  . COLECTOMY  06/2010   partial, Dr. Donne Hazel complicated by LLE CVT; S/P IVC umbrella & anemia  . HIP FRACTURE SURGERY  2006   Trauma  . TOTAL ABDOMINAL HYSTERECTOMY W/ BILATERAL SALPINGOOPHORECTOMY  1973   For Fibroids  . TOTAL HIP ARTHROPLASTY  01/03/07   Left hip replacement.  Marland Kitchen VENA CAVA FILTER PLACEMENT  06/28/2010    No Known Allergies  Outpatient Encounter Medications as of 07/24/2019  Medication Sig  . acetaminophen (TYLENOL) 500 MG tablet Take 1,000 mg by mouth every 8 (eight) hours as needed for headache (pain).   Marland Kitchen albuterol (PROVENTIL) (2.5 MG/3ML) 0.083% nebulizer solution Take 2.5 mg by nebulization every 6 (six) hours as needed for wheezing or shortness of breath.  Marland Kitchen albuterol (VENTOLIN HFA) 108 (90 Base) MCG/ACT inhaler Inhale 2 puffs into the lungs every 6 (six) hours as needed for wheezing or shortness of breath.  Marland Kitchen aspirin 81 MG chewable tablet Chew 81 mg  by mouth daily.  Marland Kitchen atorvastatin (LIPITOR) 20 MG tablet Take 1 tablet (20 mg total) by mouth daily.  . calcium-vitamin D (OSCAL WITH D) 500-200 MG-UNIT TABS tablet Take 1 tablet by mouth daily.  . Ensure (ENSURE) Take 237 mLs by mouth 3 (three) times daily between meals.   Marland Kitchen escitalopram (LEXAPRO) 5 MG tablet Take 5 mg by mouth daily.  . isosorbide mononitrate (IMDUR) 30 MG 24 hr tablet Take 0.5 tablets (15 mg total) by mouth daily.  Marland Kitchen loratadine (CLARITIN) 10 MG tablet Take 1 tablet (10 mg total) by mouth daily as needed (seasonal allergies).  . metoprolol  tartrate (LOPRESSOR) 25 MG tablet Take 1.5 tablets (37.5 mg total) by mouth 2 (two) times daily.  . mometasone-formoterol (DULERA) 100-5 MCG/ACT AERO Inhale 2 puffs into the lungs 2 (two) times daily.  . Multiple Vitamin (MULTIVITAMIN WITH MINERALS) TABS tablet Take 1 tablet by mouth daily.  . polyethylene glycol (MIRALAX / GLYCOLAX) packet Take 17 g by mouth daily as needed.  . promethazine (PHENERGAN) 12.5 MG tablet Take 1 tablet (12.5 mg total) by mouth 3 (three) times daily as needed for nausea or vomiting.  Marland Kitchen QUEtiapine (SEROQUEL) 25 MG tablet Take 2 tablets (50 mg total) by mouth at bedtime.  . tamsulosin (FLOMAX) 0.4 MG CAPS capsule Take 1 capsule (0.4 mg total) by mouth daily.  . verapamil (VERELAN PM) 120 MG 24 hr capsule Take 120 mg by mouth at bedtime.  Marland Kitchen warfarin (COUMADIN) 2 MG tablet Take 2 mg by mouth daily.  . [DISCONTINUED] nystatin (MYCOSTATIN/NYSTOP) powder Apply topically daily. Apply under bilateral breasts QD   No facility-administered encounter medications on file as of 07/24/2019.    Review of Systems  In general she is not complaining of any fever or chills.  Skin does not complain of itching rashes or increased bruising from baseline.  Head ears eyes nose mouth and throat does not complain of visual changes or sore throat.  Respiratory is not complaining of being short of breath says her breathing has been the best it has been in a while.  Cardiac does not complain of chest pain has very mild lower extremity edema at baseline.  GI is not complaining of abdominal discomfort nausea vomiting diarrhea constipation.  GU no complaints of dysuria.  Musculoskeletal at this point is not really complaining of pain other than left middle finger pain with movement and palpation.  Neurologic does not complain of feeling dizzy or syncopal or having a headache.  And psych does have some history of dementia and agitation at times but this is been stable now for some time  nursing does not report any recent behaviors she appears to be in good spirits  Immunization History  Administered Date(s) Administered  . Fluad Quad(high Dose 65+) 03/07/2019  . Influenza Split 03/30/2012  . Influenza Whole 04/30/2006, 04/20/2008, 04/15/2009  . Influenza, High Dose Seasonal PF 04/20/2013, 03/27/2015, 03/31/2016, 04/22/2017, 04/20/2018  . Influenza,inj,Quad PF,6+ Mos 05/23/2014  . Pneumococcal Conjugate-13 04/24/2015  . Pneumococcal Polysaccharide-23 07/06/2009  . Td 10/30/2016   Pertinent  Health Maintenance Due  Topic Date Due  . DEXA SCAN  02/27/2020 (Originally 11/26/1993)  . INFLUENZA VACCINE  Completed  . PNA vac Low Risk Adult  Completed   Fall Risk  07/22/2018 01/18/2018 10/30/2016 08/14/2015 04/24/2015  Falls in the past year? 1 No No No No  Number falls in past yr: 1 - - - -  Injury with Fall? 0 - - - -   Functional  Status Survey:    Vitals:   07/24/19 1200  BP: (!) 136/58  Pulse: 70  Resp: 18  Temp: 97.6 F (36.4 C)  TempSrc: Oral  SpO2: 96%  Weight: 152 lb (68.9 kg)  Height: 5\' 3"  (1.6 m)   Body mass index is 26.93 kg/m. Physical Exam   In general this is a pleasant elderly female in no distress sitting comfortably in her wheelchair.  Her skin is warm and dry.  Eyes visual acuity appears to be intact sclera and conjunctive are clear.  Oropharynx clear mucous membranes moist.  Chest is clear to auscultation with somewhat shallow air entry there is no labored breathing.  Heart is irregular irregular rate and rhythm with a baseline systolic murmur she has trace lower extremity edema appears baseline.  Abdomen soft nontender with positive bowel sounds.  Musculoskeletal moves all extremities x4 at baseline with lower extremity weakness-she does have arthritic changes of her hands bilaterally left third finger does have a swollen appearance with some erythema and tenderness to palpation she is able to move it but it hurts-there is some  warmth.  Neurologic appears grossly intact her speech is clear cannot really appreciate lateralizing findings.  Psych she is pleasant and appropriate.    Labs reviewed:  July 21, 2019.  Sodium 140 potassium 4.2 BUN 17.6 creatinine 0.86.  July 14, 2019.  WBC 3.6 hemoglobin 12.4 platelets 60,000.   Recent Labs    01/22/19 0405 01/22/19 0405 01/24/19 0432 03/07/19 1454 04/08/19 0021 04/09/19 0456 04/11/19 0556 04/11/19 0556 04/12/19 0536 04/12/19 0536 04/14/19 0532 05/18/19 0000 06/23/19 0000 06/26/19 0000 06/29/19 0000  NA 142   < > 144   < > 144   < > 148*   < > 147*   < > 145   < > 144 145 146  K 3.2*   < > 4.7   < > 3.5   < > 4.3   < > 4.1   < > 3.8   < > 4.2 4.3 4.3  CL 105   < > 112*   < > 107   < > 111   < > 108   < > 109   < > 110* 111* 112*  CO2 27   < > 24   < > 27   < > 27   < > 25   < > 24   < > 22 25* 24*  GLUCOSE 92   < > 107*   < > 114*   < > 87  --  93  --  103*  --   --   --   --   BUN 24*   < > 25*   < > 41*   < > 31*   < > 29*   < > 26*   < > 21 23* 27*  CREATININE 1.41*   < > 1.33*   < > 1.96*   < > 1.28*   < > 1.26*   < > 1.13*   < > 0.9 0.9 1.0  CALCIUM 8.5*   < > 9.4   < > 9.2   < > 9.0   < > 9.3   < > 9.4   < > 8.9 8.9 9.2  MG 2.1  --  2.0  --  2.2  --   --   --   --   --   --   --   --   --   --  PHOS 3.4  --   --   --  4.0  --   --   --   --   --   --   --   --   --   --    < > = values in this interval not displayed.   Recent Labs    01/20/19 0918 03/07/19 1454 04/07/19 1530  AST 25 17 31   ALT 15 10 15   ALKPHOS 41 43 56  BILITOT 0.7 0.8 1.4*  PROT 5.9* 6.6 6.9  ALBUMIN 3.8 4.1 4.7   Recent Labs    03/07/19 1454 03/07/19 1454 04/07/19 1530 04/07/19 1530 04/08/19 0021 04/12/19 0536 05/18/19 0000  WBC 6.3   < > 8.2   < > 6.9 5.3 4.6  NEUTROABS 4.6  --  6.5  --   --   --  3  HGB 13.3   < > 13.3   < > 12.7 12.9 12.2  HCT 40.5   < > 41.2   < > 40.0 42.0 36  MCV 93.6   < > 95.2  --  97.6 100.0  --   PLT 89.0*   < > 75*    < > 76* 70* 84*   < > = values in this interval not displayed.   Lab Results  Component Value Date   TSH 3.58 08/22/2018   Lab Results  Component Value Date   HGBA1C 5.6 11/13/2013   Lab Results  Component Value Date   CHOL 97 08/16/2017   HDL 37.70 (L) 08/16/2017   LDLCALC 48 08/16/2017   TRIG 56.0 08/16/2017   CHOLHDL 3 08/16/2017    Significant Diagnostic Results in last 30 days:  No results found.  Assessment/Plan  #1 history of left swollen middle finger with suspicions for inflammatory process-?  Pseudogout --we will start her on prednisone 60 mg first day then 40 mg day 2 and 20 mg for 3 days thereafter.  Monitor for improvement.  2.  History of hypernatremia she has received IV fluids-her midline has been pulled out by patient will await updated labs.  Addendum.  We have the updated labs which showed some improvement of sodium at 146  which is closer to her baseline BUN is 14.6 creatinine 0.81.  At this point will monitor since it has been difficult to keep dependable IV access continue to push fluids aggressively   [phone]  Of note this plan was discussed with Dr. [name]

## 2019-07-25 ENCOUNTER — Encounter: Payer: Self-pay | Admitting: Internal Medicine

## 2019-07-25 DIAGNOSIS — G9341 Metabolic encephalopathy: Secondary | ICD-10-CM | POA: Diagnosis not present

## 2019-07-25 DIAGNOSIS — M6281 Muscle weakness (generalized): Secondary | ICD-10-CM | POA: Diagnosis not present

## 2019-07-25 DIAGNOSIS — R2681 Unsteadiness on feet: Secondary | ICD-10-CM | POA: Diagnosis not present

## 2019-07-25 DIAGNOSIS — R1312 Dysphagia, oropharyngeal phase: Secondary | ICD-10-CM | POA: Diagnosis not present

## 2019-07-25 DIAGNOSIS — R41841 Cognitive communication deficit: Secondary | ICD-10-CM | POA: Diagnosis not present

## 2019-07-26 DIAGNOSIS — R41841 Cognitive communication deficit: Secondary | ICD-10-CM | POA: Diagnosis not present

## 2019-07-26 DIAGNOSIS — G9341 Metabolic encephalopathy: Secondary | ICD-10-CM | POA: Diagnosis not present

## 2019-07-26 DIAGNOSIS — R2681 Unsteadiness on feet: Secondary | ICD-10-CM | POA: Diagnosis not present

## 2019-07-26 DIAGNOSIS — M6281 Muscle weakness (generalized): Secondary | ICD-10-CM | POA: Diagnosis not present

## 2019-07-26 DIAGNOSIS — R1312 Dysphagia, oropharyngeal phase: Secondary | ICD-10-CM | POA: Diagnosis not present

## 2019-07-27 DIAGNOSIS — R2681 Unsteadiness on feet: Secondary | ICD-10-CM | POA: Diagnosis not present

## 2019-07-27 DIAGNOSIS — G9341 Metabolic encephalopathy: Secondary | ICD-10-CM | POA: Diagnosis not present

## 2019-07-27 DIAGNOSIS — R1312 Dysphagia, oropharyngeal phase: Secondary | ICD-10-CM | POA: Diagnosis not present

## 2019-07-27 DIAGNOSIS — M6281 Muscle weakness (generalized): Secondary | ICD-10-CM | POA: Diagnosis not present

## 2019-07-27 DIAGNOSIS — R41841 Cognitive communication deficit: Secondary | ICD-10-CM | POA: Diagnosis not present

## 2019-07-28 ENCOUNTER — Encounter: Payer: Self-pay | Admitting: Internal Medicine

## 2019-07-28 ENCOUNTER — Non-Acute Institutional Stay (SKILLED_NURSING_FACILITY): Payer: Medicare Other | Admitting: Internal Medicine

## 2019-07-28 DIAGNOSIS — G9341 Metabolic encephalopathy: Secondary | ICD-10-CM | POA: Diagnosis not present

## 2019-07-28 DIAGNOSIS — R600 Localized edema: Secondary | ICD-10-CM

## 2019-07-28 DIAGNOSIS — R41841 Cognitive communication deficit: Secondary | ICD-10-CM | POA: Diagnosis not present

## 2019-07-28 DIAGNOSIS — M7989 Other specified soft tissue disorders: Secondary | ICD-10-CM | POA: Diagnosis not present

## 2019-07-28 DIAGNOSIS — M6281 Muscle weakness (generalized): Secondary | ICD-10-CM | POA: Diagnosis not present

## 2019-07-28 DIAGNOSIS — R1312 Dysphagia, oropharyngeal phase: Secondary | ICD-10-CM | POA: Diagnosis not present

## 2019-07-28 DIAGNOSIS — M79604 Pain in right leg: Secondary | ICD-10-CM | POA: Diagnosis not present

## 2019-07-28 DIAGNOSIS — R2681 Unsteadiness on feet: Secondary | ICD-10-CM | POA: Diagnosis not present

## 2019-07-28 NOTE — Progress Notes (Signed)
Location:  [facility] Room Number: 212-D Place of Service:  SNF (31)  Paz, [name], MD  Patient Care Team: Colon Branch, MD as PCP - General (Internal Medicine)  Extended Emergency Contact Information Primary Emergency Contact: [name],[name] Address: [address]          [place], [place] [address] [name] of [place] Phone: [phone] Mobile Phone: [phone] Relation: Daughter    Allergies: Patient has no known allergies.  Chief Complaint  Patient presents with  . Acute Visit    Patient is seen for right lower extremity redness and tenderness.    HPI: Patient is 86 y.o. female who nursing asked me to see because patient's right lower extremity has some edema redness and tenderness.  There is some posterior tenderness to palpation.  Past Medical History:  Diagnosis Date  . Acute encephalopathy   . Acute renal failure superimposed on stage 3 chronic kidney disease ([place])   . Anemia   . Atrial fibrillation ([place])   . Colon cancer ([place]) 12/11  . Colon cancer ([place]) 2011  . Diarrhea   . DVT (deep venous thrombosis) ([place]) 11/06  . Elevated brain natriuretic peptide (BNP) level   . Frequent headaches   . Heart murmur   . Hyperkalemia   . Hypertension   . Pulmonary embolism ([place]) 11/06  . Seizures ([place])   . Stroke ([place]) 11/30/2014  . Urine retention 01/2019    Past Surgical History:  Procedure Laterality Date  . APPENDECTOMY  1952  . COLECTOMY  06/2010   partial, Dr. Donne Hazel complicated by LLE CVT; S/P IVC umbrella & anemia  . HIP FRACTURE SURGERY  2006   Trauma  . TOTAL ABDOMINAL HYSTERECTOMY W/ BILATERAL SALPINGOOPHORECTOMY  1973   For Fibroids  . TOTAL HIP ARTHROPLASTY  01/03/07   Left hip replacement.  Marland Kitchen VENA CAVA FILTER PLACEMENT  06/28/2010    Allergies as of 07/28/2019   No Known Allergies     Medication List       Accurate as of July 28, 2019  4:44 PM. If you have any questions, ask your nurse or  doctor.        acetaminophen 500 MG tablet Commonly known as: TYLENOL Take 1,000 mg by mouth every 8 (eight) hours as needed for headache (pain).   albuterol (2.5 MG/3ML) 0.083% nebulizer solution Commonly known as: PROVENTIL Take 2.5 mg by nebulization every 6 (six) hours as needed for wheezing or shortness of breath.   albuterol 108 (90 Base) MCG/ACT inhaler Commonly known as: VENTOLIN HFA Inhale 2 puffs into the lungs every 6 (six) hours as needed for wheezing or shortness of breath.   aspirin 81 MG chewable tablet Chew 81 mg by mouth daily.   atorvastatin 20 MG tablet Commonly known as: LIPITOR Take 1 tablet (20 mg total) by mouth daily.   calcium-vitamin D 500-200 MG-UNIT Tabs tablet Commonly known as: OSCAL WITH D Take 1 tablet by mouth daily.   Dulera 100-5 MCG/ACT Aero Generic drug: mometasone-formoterol Inhale 2 puffs into the lungs 2 (two) times daily.   Ensure Take 237 mLs by mouth 3 (three) times daily between meals.   escitalopram 5 MG tablet Commonly known as: LEXAPRO Take 5 mg by mouth daily.   isosorbide mononitrate 30 MG 24 hr tablet Commonly known as: IMDUR Take 0.5 tablets (15 mg total) by mouth daily.   loratadine 10 MG tablet Commonly known as: CLARITIN Take 1 tablet (10 mg total) by  mouth daily as needed (seasonal allergies).   metoprolol tartrate 25 MG tablet Commonly known as: LOPRESSOR Take 1.5 tablets (37.5 mg total) by mouth 2 (two) times daily.   multivitamin with minerals Tabs tablet Take 1 tablet by mouth daily.   polyethylene glycol 17 g packet Commonly known as: MIRALAX / GLYCOLAX Take 17 g by mouth daily as needed.   promethazine 12.5 MG tablet Commonly known as: PHENERGAN Take 1 tablet (12.5 mg total) by mouth 3 (three) times daily as needed for nausea or vomiting.   QUEtiapine 25 MG tablet Commonly known as: SEROQUEL Take 2 tablets (50 mg total) by mouth at bedtime.   tamsulosin 0.4 MG Caps capsule Commonly known as:  FLOMAX Take 1 capsule (0.4 mg total) by mouth daily.   verapamil 120 MG 24 hr capsule Commonly known as: VERELAN PM Take 120 mg by mouth at bedtime.   warfarin 2 MG tablet Commonly known as: COUMADIN Take as directed by the anticoagulation clinic. If you are unsure how to take this medication, talk to your nurse or doctor. Original instructions: Take 2 mg by mouth daily.       No orders of the defined types were placed in this encounter.   Immunization History  Administered Date(s) Administered  . Fluad Quad(high Dose 65+) 03/07/2019  . Influenza Split 03/30/2012, 03/07/2019  . Influenza Whole 04/30/2006, 04/20/2008, 04/15/2009  . Influenza, High Dose Seasonal PF 04/20/2013, 03/27/2015, 03/31/2016, 04/22/2017, 04/20/2018  . Influenza,inj,Quad PF,6+ Mos 05/23/2014  . Moderna SARS-COVID-2 Vaccination 07/06/2019  . Pneumococcal Conjugate-13 04/24/2015  . Pneumococcal Polysaccharide-23 07/06/2009  . Td 10/30/2016    Social History   Tobacco Use  . Smoking status: Passive Smoke Exposure - Never Smoker  . Smokeless tobacco: Never Used  . Tobacco comment: husband smoked in home.   Substance Use Topics  . Alcohol use: No    Alcohol/week: 0.0 standard drinks    Review of Systems Patient cannot contribute much history GENERAL:  no fevers, fatigue, appetite changes SKIN: No itching, rash HEENT: No complaint RESPIRATORY: No cough, wheezing, SOB CARDIAC: No chest pain, palpitations, + right ower extremity edema  GI: No abdominal pain, No N/V/D or constipation, No heartburn or reflux  GU: No dysuria, frequency or urgency, or incontinence  MUSCULOSKELETAL: No unrelieved bone/joint pain NEUROLOGIC: No headache, dizziness  PSYCHIATRIC: No overt anxiety or sadness  Vitals:   07/28/19 1504  BP: (!) 116/57  Pulse: 62  Resp: 18  Temp: 97.8 F (36.6 C)   Body mass index is 27.49 kg/m. Physical Exam  GENERAL APPEARANCE: Alert, conversant, No acute distress  SKIN: No  diaphoresis rash HEENT: Unremarkable RESPIRATORY: Breathing is even, unlabored. Lung sounds are clear   CARDIOVASCULAR: Heart RRR no murmurs, rubs or gallops.  No lower extremity peripheral edema with some tenderness to palpation posteriorly GASTROINTESTINAL: Abdomen is soft, non-tender, not distended w/ normal bowel sounds.  GENITOURINARY: Bladder non tender, not distended  MUSCULOSKELETAL: No abnormal joints or musculature NEUROLOGIC: Cranial nerves 2-12 grossly intact. Moves all extremities PSYCHIATRIC: Mood and affect appropriate to situation, no behavioral issues  Patient Active Problem List   Diagnosis Date Noted  . Altered behavior 05/10/2019  . Klebsiella cystitis 04/22/2019  . Metabolic encephalopathy [id number]  . Back pain 04/08/2019  . MCI (mild cognitive impairment) 03/07/2019  . Congestive heart failure (CHF) ([place]) 01/20/2019  . CAD (coronary artery disease) 06/07/2018  . Pulmonary hypertension, primary ([place]) 06/07/2018  . Generalized weakness   . Abnormal LFTs 06/01/2018  . Hallucination 06/01/2018  .  Abnormal chest x-ray 09/28/2015  . Edema of right lower extremity 09/28/2015  . Elevated brain natriuretic peptide (BNP) level 09/17/2015  . Seizures ([place]) 09/16/2015  . Asthma 08/14/2015  . PCP NOTES >>>>>>>>>>>>>>>>>>>>>>>>>>>>>> 04/24/2015  . TIA (transient ischemic attack) 12/11/2014  . Dyslipidemia 12/11/2014  . History of CVA (cerebrovascular accident) 12/01/2014  . Thrombocytopenia ([place]) 12/01/2014  . Chronic renal insufficiency, stage II (mild) 11/12/2013  . Impingement syndrome, shoulder, left 11/09/2013  . Acquired short bowel syndrome 11/09/2013  . Annual physical exam 08/30/2013  . Hx pulmonary embolism 06/27/2013  . Warfarin anticoagulation 06/27/2013  . DDD (degenerative disc disease), lumbar 11/18/2012  . ATRIAL FIBRILLATION   07/08/2010  . COLON CANCER, HX OF 07/08/2010  . DIZZINESS 10/18/2009  . Essential hypertension 12/16/2007  . HIP PAIN,  CHRONIC 12/28/2006  . ANEMIA-NOS 07/15/2006  . Personal history of venous thrombosis and embolism 07/15/2006    CMP     Component Value Date/Time   NA 146 07/24/2019 0000   NA 142 06/09/2012 1509   K 4.4 07/24/2019 0000   K 3.9 06/09/2012 1509   CL 112 (A) 07/24/2019 0000   CL 108 (H) 06/09/2012 1509   CO2 23 (A) 07/24/2019 0000   CO2 26 06/09/2012 1509   GLUCOSE 103 (H) 04/14/2019 0532   GLUCOSE 103 (H) 06/09/2012 1509   BUN 15 07/24/2019 0000   BUN 17.0 06/09/2012 1509   CREATININE 0.8 07/24/2019 0000   CREATININE 1.13 (H) 04/14/2019 0532   CREATININE 1.51 (H) 07/22/2018 1624   CREATININE 1.2 (H) 06/09/2012 1509   CALCIUM 9.3 07/24/2019 0000   CALCIUM 9.5 06/09/2012 1509   PROT 6.9 04/07/2019 1530   PROT 6.9 01/13/2018 1620   PROT 7.2 06/09/2012 1509   ALBUMIN 4.7 04/07/2019 1530   ALBUMIN 4.6 01/13/2018 1620   ALBUMIN 4.2 06/09/2012 1509   AST 31 04/07/2019 1530   AST 17 06/09/2012 1509   ALT 15 04/07/2019 1530   ALT 11 06/09/2012 1509   ALKPHOS 56 04/07/2019 1530   ALKPHOS 47 06/09/2012 1509   BILITOT 1.4 (H) 04/07/2019 1530   BILITOT 1.1 01/13/2018 1620   BILITOT 0.73 06/09/2012 1509   GFRNONAA 63.85 07/24/2019 0000   GFRAA 74 07/24/2019 0000   Recent Labs    01/22/19 0405 01/22/19 0405 01/24/19 0432 03/07/19 1454 04/08/19 0021 04/09/19 0456 04/11/19 0556 04/11/19 0556 04/12/19 0536 04/12/19 0536 04/14/19 0532 05/18/19 0000 07/03/19 0000 07/14/19 0000 07/24/19 0000  NA 142   < > 144   < > 144   < > 148*   < > 147*   < > 145   < > 143 146 146  K 3.2*   < > 4.7   < > 3.5   < > 4.3   < > 4.1   < > 3.8   < > 4.0 4.7 4.4  CL 105   < > 112*   < > 107   < > 111   < > 108   < > 109   < > 110* 113* 112*  CO2 27   < > 24   < > 27   < > 27   < > 25   < > 24   < > 23* 18 23*  GLUCOSE 92   < > 107*   < > 114*   < > 87  --  93  --  103*  --   --   --   --   BUN 24*   < >  25*   < > 41*   < > 31*   < > 29*   < > 26*   < > 29* 19 15  CREATININE 1.41*   < > 1.33*    < > 1.96*   < > 1.28*   < > 1.26*   < > 1.13*   < > 1.1 0.9 0.8  CALCIUM 8.5*   < > 9.4   < > 9.2   < > 9.0   < > 9.3   < > 9.4   < > 9.5 9.7 9.3  MG 2.1  --  2.0  --  2.2  --   --   --   --   --   --   --   --   --   --   PHOS 3.4  --   --   --  4.0  --   --   --   --   --   --   --   --   --   --    < > = values in this interval not displayed.   Recent Labs    01/20/19 0918 03/07/19 1454 04/07/19 1530  AST 25 17 31   ALT 15 10 15   ALKPHOS 41 43 56  BILITOT 0.7 0.8 1.4*  PROT 5.9* 6.6 6.9  ALBUMIN 3.8 4.1 4.7   Recent Labs    04/07/19 1530 04/07/19 1530 04/08/19 0021 04/08/19 0021 04/12/19 0536 05/18/19 0000 07/14/19 0000  WBC 8.2   < > 6.9   < > 5.3 4.6 3.6  NEUTROABS 6.5  --   --   --   --  3 2  HGB 13.3   < > 12.7   < > 12.9 12.2 12.4  HCT 41.2   < > 40.0   < > 42.0 36 37  MCV 95.2  --  97.6  --  100.0  --   --   PLT 75*   < > 76*   < > 70* 84* 60*   < > = values in this interval not displayed.   No results for input(s): CHOL, LDLCALC, TRIG in the last 8760 hours.  Invalid input(s): HCL No results found for: [facility] Lab Results  Component Value Date   TSH 3.58 08/22/2018   Lab Results  Component Value Date   HGBA1C 5.6 11/13/2013   Lab Results  Component Value Date   CHOL 97 08/16/2017   HDL 37.70 (L) 08/16/2017   LDLCALC 48 08/16/2017   TRIG 56.0 08/16/2017   CHOLHDL 3 08/16/2017    Significant Diagnostic Results in last 30 days:  No results found.  Assessment and Plan  Right lower extremity edema and warmth and tenderness-have ordered venous Doppler ultrasound.    [name], MD

## 2019-07-31 ENCOUNTER — Non-Acute Institutional Stay (SKILLED_NURSING_FACILITY): Payer: Medicare Other | Admitting: Internal Medicine

## 2019-07-31 DIAGNOSIS — R1312 Dysphagia, oropharyngeal phase: Secondary | ICD-10-CM | POA: Diagnosis not present

## 2019-07-31 DIAGNOSIS — R2681 Unsteadiness on feet: Secondary | ICD-10-CM | POA: Diagnosis not present

## 2019-07-31 DIAGNOSIS — M6281 Muscle weakness (generalized): Secondary | ICD-10-CM | POA: Diagnosis not present

## 2019-07-31 DIAGNOSIS — R41841 Cognitive communication deficit: Secondary | ICD-10-CM | POA: Diagnosis not present

## 2019-07-31 DIAGNOSIS — G9341 Metabolic encephalopathy: Secondary | ICD-10-CM | POA: Diagnosis not present

## 2019-07-31 DIAGNOSIS — J069 Acute upper respiratory infection, unspecified: Secondary | ICD-10-CM | POA: Diagnosis not present

## 2019-07-31 DIAGNOSIS — Z7901 Long term (current) use of anticoagulants: Secondary | ICD-10-CM | POA: Diagnosis not present

## 2019-07-31 DIAGNOSIS — I4811 Longstanding persistent atrial fibrillation: Secondary | ICD-10-CM | POA: Diagnosis not present

## 2019-08-01 DIAGNOSIS — R41841 Cognitive communication deficit: Secondary | ICD-10-CM | POA: Diagnosis not present

## 2019-08-01 DIAGNOSIS — R2681 Unsteadiness on feet: Secondary | ICD-10-CM | POA: Diagnosis not present

## 2019-08-01 DIAGNOSIS — R1312 Dysphagia, oropharyngeal phase: Secondary | ICD-10-CM | POA: Diagnosis not present

## 2019-08-01 DIAGNOSIS — G9341 Metabolic encephalopathy: Secondary | ICD-10-CM | POA: Diagnosis not present

## 2019-08-01 DIAGNOSIS — M6281 Muscle weakness (generalized): Secondary | ICD-10-CM | POA: Diagnosis not present

## 2019-08-02 DIAGNOSIS — G9341 Metabolic encephalopathy: Secondary | ICD-10-CM | POA: Diagnosis not present

## 2019-08-02 DIAGNOSIS — R1312 Dysphagia, oropharyngeal phase: Secondary | ICD-10-CM | POA: Diagnosis not present

## 2019-08-02 DIAGNOSIS — R2681 Unsteadiness on feet: Secondary | ICD-10-CM | POA: Diagnosis not present

## 2019-08-02 DIAGNOSIS — R41841 Cognitive communication deficit: Secondary | ICD-10-CM | POA: Diagnosis not present

## 2019-08-02 DIAGNOSIS — M6281 Muscle weakness (generalized): Secondary | ICD-10-CM | POA: Diagnosis not present

## 2019-08-03 DIAGNOSIS — R1312 Dysphagia, oropharyngeal phase: Secondary | ICD-10-CM | POA: Diagnosis not present

## 2019-08-03 DIAGNOSIS — R41841 Cognitive communication deficit: Secondary | ICD-10-CM | POA: Diagnosis not present

## 2019-08-03 DIAGNOSIS — M6281 Muscle weakness (generalized): Secondary | ICD-10-CM | POA: Diagnosis not present

## 2019-08-03 DIAGNOSIS — Z23 Encounter for immunization: Secondary | ICD-10-CM | POA: Diagnosis not present

## 2019-08-03 DIAGNOSIS — R2681 Unsteadiness on feet: Secondary | ICD-10-CM | POA: Diagnosis not present

## 2019-08-03 DIAGNOSIS — G9341 Metabolic encephalopathy: Secondary | ICD-10-CM | POA: Diagnosis not present

## 2019-08-04 DIAGNOSIS — R1312 Dysphagia, oropharyngeal phase: Secondary | ICD-10-CM | POA: Diagnosis not present

## 2019-08-04 DIAGNOSIS — G9341 Metabolic encephalopathy: Secondary | ICD-10-CM | POA: Diagnosis not present

## 2019-08-04 DIAGNOSIS — R2681 Unsteadiness on feet: Secondary | ICD-10-CM | POA: Diagnosis not present

## 2019-08-04 DIAGNOSIS — M6281 Muscle weakness (generalized): Secondary | ICD-10-CM | POA: Diagnosis not present

## 2019-08-04 DIAGNOSIS — R41841 Cognitive communication deficit: Secondary | ICD-10-CM | POA: Diagnosis not present

## 2019-08-05 DIAGNOSIS — R41841 Cognitive communication deficit: Secondary | ICD-10-CM | POA: Diagnosis not present

## 2019-08-05 DIAGNOSIS — G9341 Metabolic encephalopathy: Secondary | ICD-10-CM | POA: Diagnosis not present

## 2019-08-05 DIAGNOSIS — M6281 Muscle weakness (generalized): Secondary | ICD-10-CM | POA: Diagnosis not present

## 2019-08-05 DIAGNOSIS — R1312 Dysphagia, oropharyngeal phase: Secondary | ICD-10-CM | POA: Diagnosis not present

## 2019-08-05 DIAGNOSIS — R2681 Unsteadiness on feet: Secondary | ICD-10-CM | POA: Diagnosis not present

## 2019-08-06 ENCOUNTER — Encounter: Payer: Self-pay | Admitting: Internal Medicine

## 2019-08-06 NOTE — Progress Notes (Signed)
Location:   [facility] of Service:   SNF Provider:Melven Stockard D [name] MD  Colon Branch, MD  Patient Care Team: Colon Branch, MD as PCP - General (Internal Medicine)  Extended Emergency Contact Information Primary Emergency Contact: [facility] Address: [address]          [place], [place] [address] [name] of [place] Phone: [phone] Mobile Phone: [phone] Relation: Daughter    Allergies: Patient has no known allergies.  Chief Complaint  Patient presents with  . Acute Visit    HPI: Patient is 86 y.o. female who is being seen for PAF for INR check.  Today patient's INR is 2.1  Past Medical History:  Diagnosis Date  . Acute encephalopathy   . Acute renal failure superimposed on stage 3 chronic kidney disease ([place])   . Anemia   . Atrial fibrillation ([place])   . Colon cancer ([place]) 12/11  . Colon cancer ([place]) 2011  . Diarrhea   . DVT (deep venous thrombosis) ([place]) 11/06  . Elevated brain natriuretic peptide (BNP) level   . Frequent headaches   . Heart murmur   . Hyperkalemia   . Hypertension   . Pulmonary embolism ([place]) 11/06  . Seizures ([place])   . Stroke ([place]) 11/30/2014  . Urine retention 01/2019    Past Surgical History:  Procedure Laterality Date  . APPENDECTOMY  1952  . COLECTOMY  06/2010   partial, Dr. Donne Hazel complicated by LLE CVT; S/P IVC umbrella & anemia  . HIP FRACTURE SURGERY  2006   Trauma  . TOTAL ABDOMINAL HYSTERECTOMY W/ BILATERAL SALPINGOOPHORECTOMY  1973   For Fibroids  . TOTAL HIP ARTHROPLASTY  01/03/07   Left hip replacement.  Marland Kitchen VENA CAVA FILTER PLACEMENT  06/28/2010    Allergies as of 07/31/2019   No Known Allergies     Medication List       Accurate as of July 31, 2019 11:59 PM. If you have any questions, ask your nurse or doctor.        acetaminophen 500 MG tablet Commonly known as: TYLENOL Take 1,000 mg by mouth every 8 (eight) hours as needed for headache (pain).   albuterol (2.5  MG/3ML) 0.083% nebulizer solution Commonly known as: PROVENTIL Take 2.5 mg by nebulization every 6 (six) hours as needed for wheezing or shortness of breath.   albuterol 108 (90 Base) MCG/ACT inhaler Commonly known as: VENTOLIN HFA Inhale 2 puffs into the lungs every 6 (six) hours as needed for wheezing or shortness of breath.   aspirin 81 MG chewable tablet Chew 81 mg by mouth daily.   atorvastatin 20 MG tablet Commonly known as: LIPITOR Take 1 tablet (20 mg total) by mouth daily.   calcium-vitamin D 500-200 MG-UNIT Tabs tablet Commonly known as: OSCAL WITH D Take 1 tablet by mouth daily.   Dulera 100-5 MCG/ACT Aero Generic drug: mometasone-formoterol Inhale 2 puffs into the lungs 2 (two) times daily.   Ensure Take 237 mLs by mouth 3 (three) times daily between meals.   escitalopram 5 MG tablet Commonly known as: LEXAPRO Take 5 mg by mouth daily.   isosorbide mononitrate 30 MG 24 hr tablet Commonly known as: IMDUR Take 0.5 tablets (15 mg total) by mouth daily.   loratadine 10 MG tablet Commonly known as: CLARITIN Take 1 tablet (10 mg total) by mouth daily as needed (seasonal allergies).   metoprolol tartrate 25 MG tablet Commonly known as: LOPRESSOR Take 1.5 tablets (37.5 mg total) by mouth  2 (two) times daily.   multivitamin with minerals Tabs tablet Take 1 tablet by mouth daily.   polyethylene glycol 17 g packet Commonly known as: MIRALAX / GLYCOLAX Take 17 g by mouth daily as needed.   promethazine 12.5 MG tablet Commonly known as: PHENERGAN Take 1 tablet (12.5 mg total) by mouth 3 (three) times daily as needed for nausea or vomiting.   QUEtiapine 25 MG tablet Commonly known as: SEROQUEL Take 2 tablets (50 mg total) by mouth at bedtime.   tamsulosin 0.4 MG Caps capsule Commonly known as: FLOMAX Take 1 capsule (0.4 mg total) by mouth daily.   verapamil 120 MG 24 hr capsule Commonly known as: VERELAN PM Take 120 mg by mouth at bedtime.   warfarin 2  MG tablet Commonly known as: COUMADIN Take as directed by the anticoagulation clinic. If you are unsure how to take this medication, talk to your nurse or doctor. Original instructions: Take 2 mg by mouth daily.       No orders of the defined types were placed in this encounter.   Immunization History  Administered Date(s) Administered  . Fluad Quad(high Dose 65+) 03/07/2019  . Influenza Split 03/30/2012, 03/07/2019  . Influenza Whole 04/30/2006, 04/20/2008, 04/15/2009  . Influenza, High Dose Seasonal PF 04/20/2013, 03/27/2015, 03/31/2016, 04/22/2017, 04/20/2018  . Influenza,inj,Quad PF,6+ Mos 05/23/2014  . Moderna SARS-COVID-2 Vaccination 07/06/2019  . Pneumococcal Conjugate-13 04/24/2015  . Pneumococcal Polysaccharide-23 07/06/2009  . Td 10/30/2016    Social History   Tobacco Use  . Smoking status: Passive Smoke Exposure - Never Smoker  . Smokeless tobacco: Never Used  . Tobacco comment: husband smoked in home.   Substance Use Topics  . Alcohol use: No    Alcohol/week: 0.0 standard drinks    Review of Systems  Cannot participate minimally GENERAL:  no fevers, fatigue, appetite changes SKIN: No itching, rash HEENT: No complaint RESPIRATORY: No cough, wheezing, SOB CARDIAC: No chest pain, palpitations, lower extremity edema  GI: No abdominal pain, No N/V/D or constipation, No heartburn or reflux  GU: No dysuria, frequency or urgency, or incontinence  MUSCULOSKELETAL: No unrelieved bone/joint pain NEUROLOGIC: No headache, dizziness  PSYCHIATRIC: No overt anxiety or sadness  Vitals:   08/06/19 1636  BP: (!) 103/54  Pulse: 60  Resp: 17  Temp: (!) 97 F (36.1 C)   There is no height or weight on file to calculate BMI. Physical Exam  GENERAL APPEARANCE: Alert, conversant, No acute distress  SKIN: No diaphoresis rash HEENT: Unremarkable RESPIRATORY: Breathing is even, unlabored. Lung sounds are clear   CARDIOVASCULAR: Heart RRR no murmurs, rubs or gallops. No  peripheral edema  GASTROINTESTINAL: Abdomen is soft, non-tender, not distended w/ normal bowel sounds.  GENITOURINARY: Bladder non tender, not distended  MUSCULOSKELETAL: No abnormal joints or musculature NEUROLOGIC: Cranial nerves 2-12 grossly intact. Moves all extremities PSYCHIATRIC: Mood and affect appropriate with dementia, no behavioral issues  Patient Active Problem List   Diagnosis Date Noted  . Altered behavior 05/10/2019  . Klebsiella cystitis 04/22/2019  . Metabolic encephalopathy [id number]  . Back pain 04/08/2019  . MCI (mild cognitive impairment) 03/07/2019  . Congestive heart failure (CHF) ([place]) 01/20/2019  . CAD (coronary artery disease) 06/07/2018  . Pulmonary hypertension, primary ([place]) 06/07/2018  . Generalized weakness   . Abnormal LFTs 06/01/2018  . Hallucination 06/01/2018  . Abnormal chest x-ray 09/28/2015  . Edema of right lower extremity 09/28/2015  . Elevated brain natriuretic peptide (BNP) level 09/17/2015  . Seizures ([place]) 09/16/2015  .  Asthma 08/14/2015  . PCP NOTES >>>>>>>>>>>>>>>>>>>>>>>>>>>>>> 04/24/2015  . TIA (transient ischemic attack) 12/11/2014  . Dyslipidemia 12/11/2014  . History of CVA (cerebrovascular accident) 12/01/2014  . Thrombocytopenia ([place]) 12/01/2014  . Chronic renal insufficiency, stage II (mild) 11/12/2013  . Impingement syndrome, shoulder, left 11/09/2013  . Acquired short bowel syndrome 11/09/2013  . Annual physical exam 08/30/2013  . Hx pulmonary embolism 06/27/2013  . Warfarin anticoagulation 06/27/2013  . DDD (degenerative disc disease), lumbar 11/18/2012  . ATRIAL FIBRILLATION   07/08/2010  . COLON CANCER, HX OF 07/08/2010  . DIZZINESS 10/18/2009  . Essential hypertension 12/16/2007  . HIP PAIN, CHRONIC 12/28/2006  . ANEMIA-NOS 07/15/2006  . Personal history of venous thrombosis and embolism 07/15/2006    CMP     Component Value Date/Time   NA 146 07/24/2019 0000   NA 142 06/09/2012 1509   K 4.4 07/24/2019  0000   K 3.9 06/09/2012 1509   CL 112 (A) 07/24/2019 0000   CL 108 (H) 06/09/2012 1509   CO2 23 (A) 07/24/2019 0000   CO2 26 06/09/2012 1509   GLUCOSE 103 (H) 04/14/2019 0532   GLUCOSE 103 (H) 06/09/2012 1509   BUN 15 07/24/2019 0000   BUN 17.0 06/09/2012 1509   CREATININE 0.8 07/24/2019 0000   CREATININE 1.13 (H) 04/14/2019 0532   CREATININE 1.51 (H) 07/22/2018 1624   CREATININE 1.2 (H) 06/09/2012 1509   CALCIUM 9.3 07/24/2019 0000   CALCIUM 9.5 06/09/2012 1509   PROT 6.9 04/07/2019 1530   PROT 6.9 01/13/2018 1620   PROT 7.2 06/09/2012 1509   ALBUMIN 4.7 04/07/2019 1530   ALBUMIN 4.6 01/13/2018 1620   ALBUMIN 4.2 06/09/2012 1509   AST 31 04/07/2019 1530   AST 17 06/09/2012 1509   ALT 15 04/07/2019 1530   ALT 11 06/09/2012 1509   ALKPHOS 56 04/07/2019 1530   ALKPHOS 47 06/09/2012 1509   BILITOT 1.4 (H) 04/07/2019 1530   BILITOT 1.1 01/13/2018 1620   BILITOT 0.73 06/09/2012 1509   GFRNONAA 63.85 07/24/2019 0000   GFRAA 74 07/24/2019 0000   Recent Labs    01/22/19 0405 01/22/19 0405 01/24/19 0432 03/07/19 1454 04/08/19 0021 04/09/19 0456 04/11/19 0556 04/11/19 0556 04/12/19 0536 04/12/19 0536 04/14/19 0532 05/18/19 0000 07/03/19 0000 07/14/19 0000 07/24/19 0000  NA 142   < > 144   < > 144   < > 148*   < > 147*   < > 145   < > 143 146 146  K 3.2*   < > 4.7   < > 3.5   < > 4.3   < > 4.1   < > 3.8   < > 4.0 4.7 4.4  CL 105   < > 112*   < > 107   < > 111   < > 108   < > 109   < > 110* 113* 112*  CO2 27   < > 24   < > 27   < > 27   < > 25   < > 24   < > 23* 18 23*  GLUCOSE 92   < > 107*   < > 114*   < > 87  --  93  --  103*  --   --   --   --   BUN 24*   < > 25*   < > 41*   < > 31*   < > 29*   < > 26*   < >  29* 19 15  CREATININE 1.41*   < > 1.33*   < > 1.96*   < > 1.28*   < > 1.26*   < > 1.13*   < > 1.1 0.9 0.8  CALCIUM 8.5*   < > 9.4   < > 9.2   < > 9.0   < > 9.3   < > 9.4   < > 9.5 9.7 9.3  MG 2.1  --  2.0  --  2.2  --   --   --   --   --   --   --   --   --   --    PHOS 3.4  --   --   --  4.0  --   --   --   --   --   --   --   --   --   --    < > = values in this interval not displayed.   Recent Labs    01/20/19 0918 03/07/19 1454 04/07/19 1530  AST 25 17 31   ALT 15 10 15   ALKPHOS 41 43 56  BILITOT 0.7 0.8 1.4*  PROT 5.9* 6.6 6.9  ALBUMIN 3.8 4.1 4.7   Recent Labs    04/07/19 1530 04/07/19 1530 04/08/19 0021 04/08/19 0021 04/12/19 0536 05/18/19 0000 07/14/19 0000  WBC 8.2   < > 6.9   < > 5.3 4.6 3.6  NEUTROABS 6.5  --   --   --   --  3 2  HGB 13.3   < > 12.7   < > 12.9 12.2 12.4  HCT 41.2   < > 40.0   < > 42.0 36 37  MCV 95.2  --  97.6  --  100.0  --   --   PLT 75*   < > 76*   < > 70* 84* 60*   < > = values in this interval not displayed.   No results for input(s): CHOL, LDLCALC, TRIG in the last 8760 hours.  Invalid input(s): HCL No results found for: [facility] Lab Results  Component Value Date   TSH 3.58 08/22/2018   Lab Results  Component Value Date   HGBA1C 5.6 11/13/2013   Lab Results  Component Value Date   CHOL 97 08/16/2017   HDL 37.70 (L) 08/16/2017   LDLCALC 48 08/16/2017   TRIG 56.0 08/16/2017   CHOLHDL 3 08/16/2017    Significant Diagnostic Results in last 30 days:  No results found.  Assessment and Plan  Persistent atrial fibrillation/anticoagulated on Coumadin-patient's INR is 2.1 which is therapeutic on 2 mg daily; will continue Coumadin 2 mg daily and repeat INR in 1 week    [name], MD

## 2019-08-07 DIAGNOSIS — M6281 Muscle weakness (generalized): Secondary | ICD-10-CM | POA: Diagnosis not present

## 2019-08-07 DIAGNOSIS — R2681 Unsteadiness on feet: Secondary | ICD-10-CM | POA: Diagnosis not present

## 2019-08-07 DIAGNOSIS — G9341 Metabolic encephalopathy: Secondary | ICD-10-CM | POA: Diagnosis not present

## 2019-08-08 DIAGNOSIS — R2681 Unsteadiness on feet: Secondary | ICD-10-CM | POA: Diagnosis not present

## 2019-08-08 DIAGNOSIS — M6281 Muscle weakness (generalized): Secondary | ICD-10-CM | POA: Diagnosis not present

## 2019-08-08 DIAGNOSIS — G9341 Metabolic encephalopathy: Secondary | ICD-10-CM | POA: Diagnosis not present

## 2019-08-10 ENCOUNTER — Encounter: Payer: Self-pay | Admitting: Internal Medicine

## 2019-08-10 ENCOUNTER — Non-Acute Institutional Stay (SKILLED_NURSING_FACILITY): Payer: Medicare Other | Admitting: Internal Medicine

## 2019-08-10 DIAGNOSIS — E870 Hyperosmolality and hypernatremia: Secondary | ICD-10-CM

## 2019-08-10 DIAGNOSIS — I5032 Chronic diastolic (congestive) heart failure: Secondary | ICD-10-CM | POA: Diagnosis not present

## 2019-08-10 DIAGNOSIS — D696 Thrombocytopenia, unspecified: Secondary | ICD-10-CM

## 2019-08-10 DIAGNOSIS — R2681 Unsteadiness on feet: Secondary | ICD-10-CM | POA: Diagnosis not present

## 2019-08-10 DIAGNOSIS — I4811 Longstanding persistent atrial fibrillation: Secondary | ICD-10-CM

## 2019-08-10 DIAGNOSIS — M6281 Muscle weakness (generalized): Secondary | ICD-10-CM | POA: Diagnosis not present

## 2019-08-10 DIAGNOSIS — F0391 Unspecified dementia with behavioral disturbance: Secondary | ICD-10-CM | POA: Diagnosis not present

## 2019-08-10 DIAGNOSIS — G9341 Metabolic encephalopathy: Secondary | ICD-10-CM | POA: Diagnosis not present

## 2019-08-10 NOTE — Progress Notes (Signed)
Location:    [facility] Room Number: 212/D Place of Service:  SNF (31) Provider:  Ericka Pontiff, MD  Patient Care Team: Colon Branch, MD as PCP - General (Internal Medicine)  Extended Emergency Contact Information Primary Emergency Contact: [name],[name] Address: [address]          [place], [place] [address] [name] of [place] Phone: [phone] Mobile Phone: [phone] Relation: Daughter  Code Status:  Full Code Goals of care: Advanced Directive information Advanced Directives 08/10/2019  Does Patient Have a Medical Advance Directive? Yes  Type of Advance Directive (No Data)  Does patient want to make changes to medical advance directive? No - Patient declined  Copy of [place] in Chart? -  Would patient like information on creating a medical advance directive? -     Chief Complaint  Patient presents with  . Medical Management of Chronic Issues    Routine visit of  medical management   Medical management of chronic medical conditions including atrial fibrillation-history of dementia with psychosis-diastolic CHF-history of pulmonary embolism-COPD-thrombocytopenia-history of hypernatremia  HPI:  Pt is a 86 y.o. female seen today for medical management of chronic diseases as noted above.  Patient appears to have a period of stability.  She was recently seen for swelling and erythema of her left middle finger this was thought possibly to be a case of pseudogout and she has responded to a course of prednisone and this appears to have improved.  She also has a history of intermittent hypernatremia-at times she does receive IV fluids but keeping a line in- has been somewhat of a challenge since it tends to get pulled out at times.  Last sodium showed improvement at 146-we will update this.  She says she is trying to drink well and fluids are being encouraged.  She does have a history of  atrial fibrillation as well as pulmonary embolism and continues on Coumadin-last INR was therapeutic at 2.0 update is pending for next week.  She is on Lopressor 37.5 mg twice daily and verapamil 120 mg a day for the atrial fibrillation.  She does have a history of coronary artery disease she does not really complain of any chest pain or shortness of breath she is on aspirin 81 mg a day as well as Lopressor and a statin as well as Imdur.  She also has a history of COPD and does have orders for [facility] twice a day and albuterol as needed she says her breathing is doing okay today.  In regards to psychosis-dementia she is on Seroquel 50 mg nightly and Lexapro 5 mg a day.  At times there have been some agitation and resistance to care but I do not believe this has occurred in a while-- she is quite pleasant and appropriate when I see her.  Currently she is sitting in her chair comfortably she is bright alert pleasant does not really have any complaints.     Past Medical History:  Diagnosis Date  . Acute encephalopathy   . Acute renal failure superimposed on stage 3 chronic kidney disease ([place])   . Anemia   . Atrial fibrillation ([place])   . Colon cancer ([place]) 12/11  . Colon cancer ([place]) 2011  . Diarrhea   . DVT (deep venous thrombosis) ([place]) 11/06  . Elevated brain natriuretic peptide (BNP) level   . Frequent headaches   . Heart murmur   . Hyperkalemia   .  Hypertension   . Pulmonary embolism ([place]) 11/06  . Seizures ([place])   . Stroke ([place]) 11/30/2014  . Urine retention 01/2019   Past Surgical History:  Procedure Laterality Date  . APPENDECTOMY  1952  . COLECTOMY  06/2010   partial, Dr. Donne Hazel complicated by LLE CVT; S/P IVC umbrella & anemia  . HIP FRACTURE SURGERY  2006   Trauma  . TOTAL ABDOMINAL HYSTERECTOMY W/ BILATERAL SALPINGOOPHORECTOMY  1973   For Fibroids  . TOTAL HIP ARTHROPLASTY  01/03/07   Left hip replacement.  Marland Kitchen VENA CAVA FILTER PLACEMENT  06/28/2010    No  Known Allergies  Allergies as of 08/10/2019   No Known Allergies     Medication List       Accurate as of August 10, 2019 10:29 AM. If you have any questions, ask your nurse or doctor.        acetaminophen 500 MG tablet Commonly known as: TYLENOL Take 1,000 mg by mouth every 8 (eight) hours as needed for headache (pain).   albuterol (2.5 MG/3ML) 0.083% nebulizer solution Commonly known as: PROVENTIL Take 2.5 mg by nebulization every 6 (six) hours as needed for wheezing or shortness of breath.   albuterol 108 (90 Base) MCG/ACT inhaler Commonly known as: VENTOLIN HFA Inhale 2 puffs into the lungs every 6 (six) hours as needed for wheezing or shortness of breath.   aspirin 81 MG chewable tablet Chew 81 mg by mouth daily.   atorvastatin 20 MG tablet Commonly known as: LIPITOR Take 1 tablet (20 mg total) by mouth daily.   Dulera 100-5 MCG/ACT Aero Generic drug: mometasone-formoterol Inhale 2 puffs into the lungs 2 (two) times daily.   Ensure Take 237 mLs by mouth 3 (three) times daily between meals.   escitalopram 5 MG tablet Commonly known as: LEXAPRO Take 5 mg by mouth daily.   isosorbide mononitrate 30 MG 24 hr tablet Commonly known as: IMDUR Take 0.5 tablets (15 mg total) by mouth daily.   loratadine 10 MG tablet Commonly known as: CLARITIN Take 1 tablet (10 mg total) by mouth daily as needed (seasonal allergies).   metoprolol tartrate 25 MG tablet Commonly known as: LOPRESSOR Take 1.5 tablets (37.5 mg total) by mouth 2 (two) times daily.   multivitamin with minerals Tabs tablet Take 1 tablet by mouth daily.   NON FORMULARY Diet order: Regular liberalized diet d/t poor intake.   OYSTER SHELL/VITAMIN D PO Take 1 tablet by mouth daily. With breakfast   polyethylene glycol 17 g packet Commonly known as: MIRALAX / GLYCOLAX Take 17 g by mouth daily as needed.   promethazine 12.5 MG tablet Commonly known as: PHENERGAN Take 1 tablet (12.5 mg total) by  mouth 3 (three) times daily as needed for nausea or vomiting.   QUEtiapine 25 MG tablet Commonly known as: SEROQUEL Take 2 tablets (50 mg total) by mouth at bedtime.   tamsulosin 0.4 MG Caps capsule Commonly known as: FLOMAX Take 1 capsule (0.4 mg total) by mouth daily.   verapamil 120 MG 24 hr capsule Commonly known as: VERELAN PM Take 120 mg by mouth at bedtime.   warfarin 2 MG tablet Commonly known as: COUMADIN Take as directed by the anticoagulation clinic. If you are unsure how to take this medication, talk to your nurse or doctor. Original instructions: Take 2 mg by mouth daily.       Review of Systems In general she is not complaining of any fever or chills.  Skin does not complain of  rashes itching or diaphoresis.  Head ears eyes nose mouth and throat is not complain of visual changes or sore throat.  Respiratory denies shortness of breath or cough.  Cardiac does not complain of chest pain or increasing edema.  GI does not complain of abdominal discomfort nausea vomiting diarrhea constipation.  GU no complaints of dysuria.  Musculoskeletal has weakness and kyphosis but does not really complain of pain currently.  Neurologic does not complain of dizziness headache or numbness she does have chronic weakness.  Psych does have a history of dementia with psychosis but this ihas been stable now for some time it appears.  She does not complain of being anxious or depressed today.   Immunization History  Administered Date(s) Administered  . Fluad Quad(high Dose 65+) 03/07/2019  . Influenza Split 03/30/2012, 03/07/2019  . Influenza Whole 04/30/2006, 04/20/2008, 04/15/2009  . Influenza, High Dose Seasonal PF 04/20/2013, 03/27/2015, 03/31/2016, 04/22/2017, 04/20/2018  . Influenza,inj,Quad PF,6+ Mos 05/23/2014  . Moderna SARS-COVID-2 Vaccination 07/06/2019, 08/03/2019  . Pneumococcal Conjugate-13 04/24/2015  . Pneumococcal Polysaccharide-23 07/06/2009  . Td  10/30/2016   Pertinent  Health Maintenance Due  Topic Date Due  . DEXA SCAN  02/27/2020 (Originally 11/26/1993)  . INFLUENZA VACCINE  Completed  . PNA vac Low Risk Adult  Completed   Fall Risk  07/22/2018 01/18/2018 10/30/2016 08/14/2015 04/24/2015  Falls in the past year? 1 No No No No  Number falls in past yr: 1 - - - -  Injury with Fall? 0 - - - -   Functional Status Survey:    Vitals:   08/10/19 1009  BP: (!) 120/55  Pulse: (!) 52  Resp: 18  Temp: 98.8 F (37.1 C)  TempSrc: Oral  SpO2: 96%  Weight: 154 lb 9.6 oz (70.1 kg)  Height: 5\' 3"  (1.6 m)   Body mass index is 27.39 kg/m. Physical Exam In general this is a pleasant elderly female in no distress sitting comfortably in her wheelchair.  Her skin is warm and dry.  Eyes visual acuity appears to be intact sclera and conjunctive are clear.  Oropharynx is clear mucous membranes moist.  Chest is clear to auscultation with shallow air entry there is no labored breathing.  Heart is irregular irregular rate and rhythm pulses in the 50s she has quite mild lower extremity edema this is more than right leg there is no tenderness to palpation minimal erythema this appears to be venous stasis.  Abdomen is soft nontender with positive bowel sounds.  Musculoskeletal does move all extremities x4 at baseline with baseline lower extremity weakness.  The erythema and edema of the left middle finger appears resolved she does have arthritic changes of her hands and fingers bilaterally  Psych she is oriented to self she is pleasant appropriate and follows simple verbal commands without difficulty   Labs reviewed: Recent Labs    01/22/19 0405 01/22/19 0405 01/24/19 0432 03/07/19 1454 04/08/19 0021 04/09/19 0456 04/11/19 0556 04/11/19 0556 04/12/19 0536 04/12/19 0536 04/14/19 0532 05/18/19 0000 07/03/19 0000 07/14/19 0000 07/24/19 0000  NA 142   < > 144   < > 144   < > 148*   < > 147*   < > 145   < > 143 146 146  K 3.2*    < > 4.7   < > 3.5   < > 4.3   < > 4.1   < > 3.8   < > 4.0 4.7 4.4  CL 105   < >  112*   < > 107   < > 111   < > 108   < > 109   < > 110* 113* 112*  CO2 27   < > 24   < > 27   < > 27   < > 25   < > 24   < > 23* 18 23*  GLUCOSE 92   < > 107*   < > 114*   < > 87  --  93  --  103*  --   --   --   --   BUN 24*   < > 25*   < > 41*   < > 31*   < > 29*   < > 26*   < > 29* 19 15  CREATININE 1.41*   < > 1.33*   < > 1.96*   < > 1.28*   < > 1.26*   < > 1.13*   < > 1.1 0.9 0.8  CALCIUM 8.5*   < > 9.4   < > 9.2   < > 9.0   < > 9.3   < > 9.4   < > 9.5 9.7 9.3  MG 2.1  --  2.0  --  2.2  --   --   --   --   --   --   --   --   --   --   PHOS 3.4  --   --   --  4.0  --   --   --   --   --   --   --   --   --   --    < > = values in this interval not displayed.   Recent Labs    01/20/19 0918 03/07/19 1454 04/07/19 1530  AST 25 17 31   ALT 15 10 15   ALKPHOS 41 43 56  BILITOT 0.7 0.8 1.4*  PROT 5.9* 6.6 6.9  ALBUMIN 3.8 4.1 4.7   Recent Labs    04/07/19 1530 04/07/19 1530 04/08/19 0021 04/08/19 0021 04/12/19 0536 05/18/19 0000 07/14/19 0000  WBC 8.2   < > 6.9   < > 5.3 4.6 3.6  NEUTROABS 6.5  --   --   --   --  3 2  HGB 13.3   < > 12.7   < > 12.9 12.2 12.4  HCT 41.2   < > 40.0   < > 42.0 36 37  MCV 95.2  --  97.6  --  100.0  --   --   PLT 75*   < > 76*   < > 70* 84* 60*   < > = values in this interval not displayed.   Lab Results  Component Value Date   TSH 3.58 08/22/2018   Lab Results  Component Value Date   HGBA1C 5.6 11/13/2013   Lab Results  Component Value Date   CHOL 97 08/16/2017   HDL 37.70 (L) 08/16/2017   LDLCALC 48 08/16/2017   TRIG 56.0 08/16/2017   CHOLHDL 3 08/16/2017    Significant Diagnostic Results in last 30 days:  No results found.  Assessment/Plan  #1 history of atrial fibrillation this appears rate controlled on Lopressor 37.5 mg twice daily and verapamil 120 mg a day-she is on Coumadin for anticoagulation.  INR has been therapeutic and update INR is  pending next week.  At times she has pulses in the 50s but this is well-tolerated and  she is asymptomatic of any significant bradycardia-at this point will continue to monitor.  2.  History of pulmonary embolism as noted above she continues on Coumadin-.  3.  History of diastolic CHF-this appears stable she is not on Lasix secondary to concerns for hyponatremia-she has not had concerning weight gain.   4.-History of hypernatremia-this appears intermittent-her Lasix has been basically discontinued-fluids are being encouraged strongly-at times she will get IV fluids but at times has trouble keeping an IV line in.  Last sodium was 146 which appears relatively stable will update this.  5.  History of dementia with psychosis again this appears to be stable on Seroquel 50 mg nightly as well as Lexapro 5 mg a day.  6.  History of coronary artery disease appears stable as well without complaints of chest pain or significant shortness of breath she is on aspirin 81 mg a day Imdur 50 mg a day as well as Lopressor 37.5 mg twice daily and a statin.  7.  History of thrombocytopenia there is some chronicity to this last platelet count was 60,000 we will have this rechecked I do not see any increased bruising from baseline.  8.  History of COPD this appears stable at this point on Dulera twice daily she has albuterol as needed if needed.  [id number].

## 2019-08-11 DIAGNOSIS — I10 Essential (primary) hypertension: Secondary | ICD-10-CM | POA: Diagnosis not present

## 2019-08-11 DIAGNOSIS — M6281 Muscle weakness (generalized): Secondary | ICD-10-CM | POA: Diagnosis not present

## 2019-08-11 DIAGNOSIS — R2681 Unsteadiness on feet: Secondary | ICD-10-CM | POA: Diagnosis not present

## 2019-08-11 DIAGNOSIS — G9341 Metabolic encephalopathy: Secondary | ICD-10-CM | POA: Diagnosis not present

## 2019-08-11 DIAGNOSIS — D649 Anemia, unspecified: Secondary | ICD-10-CM | POA: Diagnosis not present

## 2019-08-11 LAB — BASIC METABOLIC PANEL
BUN: 35 — AB (ref 4–21)
CO2: 21 (ref 13–22)
Chloride: 108 (ref 99–108)
Creatinine: 1.2 — AB (ref 0.5–1.1)
Glucose: 118
Potassium: 5.1 (ref 3.4–5.3)
Sodium: 138 (ref 137–147)

## 2019-08-11 LAB — COMPREHENSIVE METABOLIC PANEL
Calcium: 8.9 (ref 8.7–10.7)
GFR calc Af Amer: 47.92
GFR calc non Af Amer: 41.35

## 2019-08-11 LAB — CBC AND DIFFERENTIAL
HCT: 37 (ref 36–46)
Hemoglobin: 12.4 (ref 12.0–16.0)
Neutrophils Absolute: 4
Platelets: 69 — AB (ref 150–399)
WBC: 5.2

## 2019-08-11 LAB — CBC: RBC: 4.04 (ref 3.87–5.11)

## 2019-08-12 DIAGNOSIS — M6281 Muscle weakness (generalized): Secondary | ICD-10-CM | POA: Diagnosis not present

## 2019-08-12 DIAGNOSIS — G9341 Metabolic encephalopathy: Secondary | ICD-10-CM | POA: Diagnosis not present

## 2019-08-12 DIAGNOSIS — R2681 Unsteadiness on feet: Secondary | ICD-10-CM | POA: Diagnosis not present

## 2019-08-13 ENCOUNTER — Encounter: Payer: Self-pay | Admitting: Internal Medicine

## 2019-08-14 ENCOUNTER — Non-Acute Institutional Stay (SKILLED_NURSING_FACILITY): Payer: Medicare Other | Admitting: Internal Medicine

## 2019-08-14 DIAGNOSIS — R2681 Unsteadiness on feet: Secondary | ICD-10-CM | POA: Diagnosis not present

## 2019-08-14 DIAGNOSIS — Z86718 Personal history of other venous thrombosis and embolism: Secondary | ICD-10-CM

## 2019-08-14 DIAGNOSIS — G9341 Metabolic encephalopathy: Secondary | ICD-10-CM | POA: Diagnosis not present

## 2019-08-14 DIAGNOSIS — M6281 Muscle weakness (generalized): Secondary | ICD-10-CM | POA: Diagnosis not present

## 2019-08-14 DIAGNOSIS — I4811 Longstanding persistent atrial fibrillation: Secondary | ICD-10-CM | POA: Diagnosis not present

## 2019-08-14 DIAGNOSIS — Z7901 Long term (current) use of anticoagulants: Secondary | ICD-10-CM | POA: Diagnosis not present

## 2019-08-15 DIAGNOSIS — M6281 Muscle weakness (generalized): Secondary | ICD-10-CM | POA: Diagnosis not present

## 2019-08-15 DIAGNOSIS — R2681 Unsteadiness on feet: Secondary | ICD-10-CM | POA: Diagnosis not present

## 2019-08-15 DIAGNOSIS — G9341 Metabolic encephalopathy: Secondary | ICD-10-CM | POA: Diagnosis not present

## 2019-08-16 DIAGNOSIS — M6281 Muscle weakness (generalized): Secondary | ICD-10-CM | POA: Diagnosis not present

## 2019-08-16 DIAGNOSIS — R2681 Unsteadiness on feet: Secondary | ICD-10-CM | POA: Diagnosis not present

## 2019-08-16 DIAGNOSIS — G9341 Metabolic encephalopathy: Secondary | ICD-10-CM | POA: Diagnosis not present

## 2019-08-17 DIAGNOSIS — G9341 Metabolic encephalopathy: Secondary | ICD-10-CM | POA: Diagnosis not present

## 2019-08-17 DIAGNOSIS — M6281 Muscle weakness (generalized): Secondary | ICD-10-CM | POA: Diagnosis not present

## 2019-08-17 DIAGNOSIS — R2681 Unsteadiness on feet: Secondary | ICD-10-CM | POA: Diagnosis not present

## 2019-08-18 DIAGNOSIS — G9341 Metabolic encephalopathy: Secondary | ICD-10-CM | POA: Diagnosis not present

## 2019-08-18 DIAGNOSIS — M6281 Muscle weakness (generalized): Secondary | ICD-10-CM | POA: Diagnosis not present

## 2019-08-18 DIAGNOSIS — R2681 Unsteadiness on feet: Secondary | ICD-10-CM | POA: Diagnosis not present

## 2019-08-20 ENCOUNTER — Encounter: Payer: Self-pay | Admitting: Internal Medicine

## 2019-08-20 NOTE — Progress Notes (Signed)
Location:      Place of Service:     Colon Branch, MD  Patient Care Team: Colon Branch, MD as PCP - General (Internal Medicine)  Extended Emergency Contact Information Primary Emergency Contact: [facility] Address: [address]          [place], [place] [address] [name] of [place] Phone: [phone] Mobile Phone: [phone] Relation: Daughter    Allergies: Patient has no known allergies.  Chief Complaint  Patient presents with  . Acute Visit    HPI: Patient is 86 y.o. female with atrial fib, history of PE and anticoagulated on Coumadin who is being seen for an INR check.  Today patient's INR is 1.9 which is not quite therapeutic.  Past Medical History:  Diagnosis Date  . Acute encephalopathy   . Acute renal failure superimposed on stage 3 chronic kidney disease ([place])   . Anemia   . Atrial fibrillation ([place])   . Colon cancer ([place]) 12/11  . Colon cancer ([place]) 2011  . Diarrhea   . DVT (deep venous thrombosis) ([place]) 11/06  . Elevated brain natriuretic peptide (BNP) level   . Frequent headaches   . Heart murmur   . Hyperkalemia   . Hypertension   . Pulmonary embolism ([place]) 11/06  . Seizures ([place])   . Stroke ([place]) 11/30/2014  . Urine retention 01/2019    Past Surgical History:  Procedure Laterality Date  . APPENDECTOMY  1952  . COLECTOMY  06/2010   partial, Dr. Donne Hazel complicated by LLE CVT; S/P IVC umbrella & anemia  . HIP FRACTURE SURGERY  2006   Trauma  . TOTAL ABDOMINAL HYSTERECTOMY W/ BILATERAL SALPINGOOPHORECTOMY  1973   For Fibroids  . TOTAL HIP ARTHROPLASTY  01/03/07   Left hip replacement.  Marland Kitchen VENA CAVA FILTER PLACEMENT  06/28/2010    Allergies as of 08/14/2019   No Known Allergies     Medication List       Accurate as of August 14, 2019 11:59 PM. If you have any questions, ask your nurse or doctor.        acetaminophen 500 MG tablet Commonly known as: TYLENOL Take 1,000 mg by mouth every 8 (eight) hours as needed  for headache (pain).   albuterol (2.5 MG/3ML) 0.083% nebulizer solution Commonly known as: PROVENTIL Take 2.5 mg by nebulization every 6 (six) hours as needed for wheezing or shortness of breath.   albuterol 108 (90 Base) MCG/ACT inhaler Commonly known as: VENTOLIN HFA Inhale 2 puffs into the lungs every 6 (six) hours as needed for wheezing or shortness of breath.   aspirin 81 MG chewable tablet Chew 81 mg by mouth daily.   atorvastatin 20 MG tablet Commonly known as: LIPITOR Take 1 tablet (20 mg total) by mouth daily.   Dulera 100-5 MCG/ACT Aero Generic drug: mometasone-formoterol Inhale 2 puffs into the lungs 2 (two) times daily.   Ensure Take 237 mLs by mouth 3 (three) times daily between meals.   escitalopram 5 MG tablet Commonly known as: LEXAPRO Take 5 mg by mouth daily.   isosorbide mononitrate 30 MG 24 hr tablet Commonly known as: IMDUR Take 0.5 tablets (15 mg total) by mouth daily.   loratadine 10 MG tablet Commonly known as: CLARITIN Take 1 tablet (10 mg total) by mouth daily as needed (seasonal allergies).   metoprolol tartrate 25 MG tablet Commonly known as: LOPRESSOR Take 1.5 tablets (37.5 mg total) by mouth 2 (two) times daily.   multivitamin with minerals Tabs tablet  Take 1 tablet by mouth daily.   NON FORMULARY Diet order: Regular liberalized diet d/t poor intake.   OYSTER SHELL/VITAMIN D PO Take 1 tablet by mouth daily. With breakfast   polyethylene glycol 17 g packet Commonly known as: MIRALAX / GLYCOLAX Take 17 g by mouth daily as needed.   promethazine 12.5 MG tablet Commonly known as: PHENERGAN Take 1 tablet (12.5 mg total) by mouth 3 (three) times daily as needed for nausea or vomiting.   QUEtiapine 25 MG tablet Commonly known as: SEROQUEL Take 2 tablets (50 mg total) by mouth at bedtime.   tamsulosin 0.4 MG Caps capsule Commonly known as: FLOMAX Take 1 capsule (0.4 mg total) by mouth daily.   verapamil 120 MG 24 hr  capsule Commonly known as: VERELAN PM Take 120 mg by mouth at bedtime.   warfarin 2 MG tablet Commonly known as: COUMADIN Take as directed by the anticoagulation clinic. If you are unsure how to take this medication, talk to your nurse or doctor. Original instructions: Take 2 mg by mouth daily.       No orders of the defined types were placed in this encounter.   Immunization History  Administered Date(s) Administered  . Fluad Quad(high Dose 65+) 03/07/2019  . Influenza Split 03/30/2012, 03/07/2019  . Influenza Whole 04/30/2006, 04/20/2008, 04/15/2009  . Influenza, High Dose Seasonal PF 04/20/2013, 03/27/2015, 03/31/2016, 04/22/2017, 04/20/2018  . Influenza,inj,Quad PF,6+ Mos 05/23/2014  . Moderna SARS-COVID-2 Vaccination 07/06/2019, 08/03/2019  . Pneumococcal Conjugate-13 04/24/2015  . Pneumococcal Polysaccharide-23 07/06/2009  . Td 10/30/2016    Social History   Tobacco Use  . Smoking status: Passive Smoke Exposure - Never Smoker  . Smokeless tobacco: Never Used  . Tobacco comment: husband smoked in home.   Substance Use Topics  . Alcohol use: No    Alcohol/week: 0.0 standard drinks     Vitals:   08/20/19 1644  BP: (!) 120/55  Pulse: (!) 52  Resp: 18  Temp: 98.8 F (37.1 C)   Body mass index is 27.28 kg/m.   Patient Active Problem List   Diagnosis Date Noted  . Altered behavior 05/10/2019  . Klebsiella cystitis 04/22/2019  . Metabolic encephalopathy [id number]  . Back pain 04/08/2019  . MCI (mild cognitive impairment) 03/07/2019  . Congestive heart failure (CHF) ([place]) 01/20/2019  . CAD (coronary artery disease) 06/07/2018  . Pulmonary hypertension, primary ([place]) 06/07/2018  . Generalized weakness   . Abnormal LFTs 06/01/2018  . Hallucination 06/01/2018  . Abnormal chest x-ray 09/28/2015  . Edema of right lower extremity 09/28/2015  . Elevated brain natriuretic peptide (BNP) level 09/17/2015  . Seizures ([place]) 09/16/2015  . Asthma 08/14/2015  .  PCP NOTES >>>>>>>>>>>>>>>>>>>>>>>>>>>>>> 04/24/2015  . TIA (transient ischemic attack) 12/11/2014  . Dyslipidemia 12/11/2014  . History of CVA (cerebrovascular accident) 12/01/2014  . Thrombocytopenia ([place]) 12/01/2014  . Chronic renal insufficiency, stage II (mild) 11/12/2013  . Impingement syndrome, shoulder, left 11/09/2013  . Acquired short bowel syndrome 11/09/2013  . Annual physical exam 08/30/2013  . Hx pulmonary embolism 06/27/2013  . Warfarin anticoagulation 06/27/2013  . DDD (degenerative disc disease), lumbar 11/18/2012  . ATRIAL FIBRILLATION   07/08/2010  . COLON CANCER, HX OF 07/08/2010  . DIZZINESS 10/18/2009  . Essential hypertension 12/16/2007  . HIP PAIN, CHRONIC 12/28/2006  . ANEMIA-NOS 07/15/2006  . Personal history of venous thrombosis and embolism 07/15/2006    CMP     Component Value Date/Time   NA 146 07/24/2019 0000   NA 142 06/09/2012  1509   K 4.4 07/24/2019 0000   K 3.9 06/09/2012 1509   CL 112 (A) 07/24/2019 0000   CL 108 (H) 06/09/2012 1509   CO2 23 (A) 07/24/2019 0000   CO2 26 06/09/2012 1509   GLUCOSE 103 (H) 04/14/2019 0532   GLUCOSE 103 (H) 06/09/2012 1509   BUN 15 07/24/2019 0000   BUN 17.0 06/09/2012 1509   CREATININE 0.8 07/24/2019 0000   CREATININE 1.13 (H) 04/14/2019 0532   CREATININE 1.51 (H) 07/22/2018 1624   CREATININE 1.2 (H) 06/09/2012 1509   CALCIUM 9.3 07/24/2019 0000   CALCIUM 9.5 06/09/2012 1509   PROT 6.9 04/07/2019 1530   PROT 6.9 01/13/2018 1620   PROT 7.2 06/09/2012 1509   ALBUMIN 4.7 04/07/2019 1530   ALBUMIN 4.6 01/13/2018 1620   ALBUMIN 4.2 06/09/2012 1509   AST 31 04/07/2019 1530   AST 17 06/09/2012 1509   ALT 15 04/07/2019 1530   ALT 11 06/09/2012 1509   ALKPHOS 56 04/07/2019 1530   ALKPHOS 47 06/09/2012 1509   BILITOT 1.4 (H) 04/07/2019 1530   BILITOT 1.1 01/13/2018 1620   BILITOT 0.73 06/09/2012 1509   GFRNONAA 63.85 07/24/2019 0000   GFRAA 74 07/24/2019 0000   Recent Labs    01/22/19 0405  01/22/19 0405 01/24/19 0432 03/07/19 1454 04/08/19 0021 04/09/19 0456 04/11/19 0556 04/11/19 0556 04/12/19 0536 04/12/19 0536 04/14/19 0532 05/18/19 0000 07/03/19 0000 07/14/19 0000 07/24/19 0000  NA 142   < > 144   < > 144   < > 148*   < > 147*   < > 145   < > 143 146 146  K 3.2*   < > 4.7   < > 3.5   < > 4.3   < > 4.1   < > 3.8   < > 4.0 4.7 4.4  CL 105   < > 112*   < > 107   < > 111   < > 108   < > 109   < > 110* 113* 112*  CO2 27   < > 24   < > 27   < > 27   < > 25   < > 24   < > 23* 18 23*  GLUCOSE 92   < > 107*   < > 114*   < > 87  --  93  --  103*  --   --   --   --   BUN 24*   < > 25*   < > 41*   < > 31*   < > 29*   < > 26*   < > 29* 19 15  CREATININE 1.41*   < > 1.33*   < > 1.96*   < > 1.28*   < > 1.26*   < > 1.13*   < > 1.1 0.9 0.8  CALCIUM 8.5*   < > 9.4   < > 9.2   < > 9.0   < > 9.3   < > 9.4   < > 9.5 9.7 9.3  MG 2.1  --  2.0  --  2.2  --   --   --   --   --   --   --   --   --   --   PHOS 3.4  --   --   --  4.0  --   --   --   --   --   --   --   --   --   --    < > =  values in this interval not displayed.   Recent Labs    01/20/19 0918 03/07/19 1454 04/07/19 1530  AST 25 17 31   ALT 15 10 15   ALKPHOS 41 43 56  BILITOT 0.7 0.8 1.4*  PROT 5.9* 6.6 6.9  ALBUMIN 3.8 4.1 4.7   Recent Labs    04/07/19 1530 04/07/19 1530 04/08/19 0021 04/08/19 0021 04/12/19 0536 05/18/19 0000 07/14/19 0000  WBC 8.2   < > 6.9   < > 5.3 4.6 3.6  NEUTROABS 6.5  --   --   --   --  3 2  HGB 13.3   < > 12.7   < > 12.9 12.2 12.4  HCT 41.2   < > 40.0   < > 42.0 36 37  MCV 95.2  --  97.6  --  100.0  --   --   PLT 75*   < > 76*   < > 70* 84* 60*   < > = values in this interval not displayed.   No results for input(s): CHOL, LDLCALC, TRIG in the last 8760 hours.  Invalid input(s): HCL No results found for: [facility] Lab Results  Component Value Date   TSH 3.58 08/22/2018   Lab Results  Component Value Date   HGBA1C 5.6 11/13/2013   Lab Results  Component Value Date    CHOL 97 08/16/2017   HDL 37.70 (L) 08/16/2017   LDLCALC 48 08/16/2017   TRIG 56.0 08/16/2017   CHOLHDL 3 08/16/2017    Significant Diagnostic Results in last 30 days:  No results found.  Assessment and Plan  Atrial fibrillation/personal history of PE/anticoagulated on Coumadin-patient's INR is 1.9 on 2 mg daily; will start 2.5 mg on Wednesday and 2 mg on all other days and repeat INR in 1 week     [name] , MD

## 2019-08-21 ENCOUNTER — Encounter: Payer: Self-pay | Admitting: Internal Medicine

## 2019-08-21 ENCOUNTER — Non-Acute Institutional Stay (SKILLED_NURSING_FACILITY): Payer: Medicare Other | Admitting: Internal Medicine

## 2019-08-21 DIAGNOSIS — M6281 Muscle weakness (generalized): Secondary | ICD-10-CM | POA: Diagnosis not present

## 2019-08-21 DIAGNOSIS — G9341 Metabolic encephalopathy: Secondary | ICD-10-CM | POA: Diagnosis not present

## 2019-08-21 DIAGNOSIS — I4811 Longstanding persistent atrial fibrillation: Secondary | ICD-10-CM | POA: Diagnosis not present

## 2019-08-21 DIAGNOSIS — Z7901 Long term (current) use of anticoagulants: Secondary | ICD-10-CM

## 2019-08-21 DIAGNOSIS — R2681 Unsteadiness on feet: Secondary | ICD-10-CM | POA: Diagnosis not present

## 2019-08-21 NOTE — Progress Notes (Deleted)
Location:  [facility] Room Number: 212-D Place of Service:  SNF (31)  Hendricks, Megan Berthold, MD  Patient Care Team: Colon Branch, MD as PCP - General (Internal Medicine)  Extended Emergency Contact Information Primary Emergency Contact: [name],[name] Address: [address]          [place], [place] [address] [name] of [place] Phone: [phone] Mobile Phone: [phone] Relation: Daughter    Allergies: Patient has no known allergies.  Chief Complaint  Patient presents with  . Anticoagulation    INR management    HPI: Patient is a 86 y.o. female who   Past Medical History:  Diagnosis Date  . Acute encephalopathy   . Acute renal failure superimposed on stage 3 chronic kidney disease ([place])   . Anemia   . Atrial fibrillation ([place])   . Colon cancer ([place]) 12/11  . Colon cancer ([place]) 2011  . Diarrhea   . DVT (deep venous thrombosis) ([place]) 11/06  . Elevated brain natriuretic peptide (BNP) level   . Frequent headaches   . Heart murmur   . Hyperkalemia   . Hypertension   . Pulmonary embolism ([place]) 11/06  . Seizures ([place])   . Stroke ([place]) 11/30/2014  . Urine retention 01/2019    Past Surgical History:  Procedure Laterality Date  . APPENDECTOMY  1952  . COLECTOMY  06/2010   partial, Dr. Donne Hazel complicated by LLE CVT; S/P IVC umbrella & anemia  . HIP FRACTURE SURGERY  2006   Trauma  . TOTAL ABDOMINAL HYSTERECTOMY W/ BILATERAL SALPINGOOPHORECTOMY  1973   For Fibroids  . TOTAL HIP ARTHROPLASTY  01/03/07   Left hip replacement.  Marland Kitchen VENA CAVA FILTER PLACEMENT  06/28/2010    Allergies as of 08/21/2019   No Known Allergies     Medication List       Accurate as of August 21, 2019  3:50 PM. If you have any questions, ask your nurse or doctor.        acetaminophen 500 MG tablet Commonly known as: TYLENOL Take 1,000 mg by mouth every 8 (eight) hours as needed for headache (pain).   albuterol (2.5 MG/3ML) 0.083% nebulizer  solution Commonly known as: PROVENTIL Take 2.5 mg by nebulization every 6 (six) hours as needed for wheezing or shortness of breath.   albuterol 108 (90 Base) MCG/ACT inhaler Commonly known as: VENTOLIN HFA Inhale 2 puffs into the lungs every 6 (six) hours as needed for wheezing or shortness of breath.   aspirin 81 MG chewable tablet Chew 81 mg by mouth daily.   atorvastatin 20 MG tablet Commonly known as: LIPITOR Take 1 tablet (20 mg total) by mouth daily.   Dulera 100-5 MCG/ACT Aero Generic drug: mometasone-formoterol Inhale 2 puffs into the lungs 2 (two) times daily.   Ensure Take 237 mLs by mouth 3 (three) times daily between meals.   escitalopram 5 MG tablet Commonly known as: LEXAPRO Take 5 mg by mouth daily.   isosorbide mononitrate 30 MG 24 hr tablet Commonly known as: IMDUR Take 0.5 tablets (15 mg total) by mouth daily.   loratadine 10 MG tablet Commonly known as: CLARITIN Take 1 tablet (10 mg total) by mouth daily as needed (seasonal allergies).   metoprolol tartrate 25 MG tablet Commonly known as: LOPRESSOR Take 1.5 tablets (37.5 mg total) by mouth 2 (two) times daily.   multivitamin with minerals Tabs tablet Take 1 tablet by mouth daily.   NON FORMULARY Diet order: Regular liberalized diet  d/t poor intake.   OYSTER SHELL/VITAMIN D PO Take 1 tablet by mouth daily. With breakfast   polyethylene glycol 17 g packet Commonly known as: MIRALAX / GLYCOLAX Take 17 g by mouth daily as needed.   promethazine 12.5 MG tablet Commonly known as: PHENERGAN Take 1 tablet (12.5 mg total) by mouth 3 (three) times daily as needed for nausea or vomiting.   QUEtiapine 25 MG tablet Commonly known as: SEROQUEL Take 2 tablets (50 mg total) by mouth at bedtime.   tamsulosin 0.4 MG Caps capsule Commonly known as: FLOMAX Take 1 capsule (0.4 mg total) by mouth daily.   verapamil 120 MG 24 hr capsule Commonly known as: VERELAN PM Take 120 mg by mouth at bedtime.     warfarin 2 MG tablet Commonly known as: COUMADIN Take as directed by the anticoagulation clinic. If you are unsure how to take this medication, talk to your nurse or doctor. Original instructions: Take 2 mg by mouth daily.       No orders of the defined types were placed in this encounter.   Immunization History  Administered Date(s) Administered  . Fluad Quad(high Dose 65+) 03/07/2019  . Influenza Split 03/30/2012, 03/07/2019  . Influenza Whole 04/30/2006, 04/20/2008, 04/15/2009  . Influenza, High Dose Seasonal PF 04/20/2013, 03/27/2015, 03/31/2016, 04/22/2017, 04/20/2018  . Influenza,inj,Quad PF,6+ Mos 05/23/2014  . Moderna SARS-COVID-2 Vaccination 07/06/2019, 08/03/2019  . Pneumococcal Conjugate-13 04/24/2015  . Pneumococcal Polysaccharide-23 07/06/2009  . Td 10/30/2016    Social History   Tobacco Use  . Smoking status: Passive Smoke Exposure - Never Smoker  . Smokeless tobacco: Never Used  . Tobacco comment: husband smoked in home.   Substance Use Topics  . Alcohol use: No    Alcohol/week: 0.0 standard drinks    Review of Systems  DATA OBTAINED: from patient, nurse, medical record, family member GENERAL:  no fevers, fatigue, appetite changes SKIN: No itching, rash HEENT: No complaint RESPIRATORY: No cough, wheezing, SOB CARDIAC: No chest pain, palpitations, lower extremity edema  GI: No abdominal pain, No N/V/D or constipation, No heartburn or reflux  GU: No dysuria, frequency or urgency, or incontinence  MUSCULOSKELETAL: No unrelieved bone/joint pain NEUROLOGIC: No headache, dizziness  PSYCHIATRIC: No overt anxiety or sadness  Vitals:   08/21/19 1549  BP: (!) 148/66  Pulse: 69  Resp: 18  Temp: (!) 97.3 F (36.3 C)   Body mass index is 28.27 kg/m. Physical Exam  GENERAL APPEARANCE: Alert, conversant, No acute distress  SKIN: No diaphoresis rash HEENT: Unremarkable RESPIRATORY: Breathing is even, unlabored. Lung sounds are clear   CARDIOVASCULAR:  Heart RRR no murmurs, rubs or gallops. No peripheral edema  GASTROINTESTINAL: Abdomen is soft, non-tender, not distended w/ normal bowel sounds.  GENITOURINARY: Bladder non tender, not distended  MUSCULOSKELETAL: No abnormal joints or musculature NEUROLOGIC: Cranial nerves 2-12 grossly intact. Moves all extremities PSYCHIATRIC: Mood and affect appropriate to situation, no behavioral issues  Patient Active Problem List   Diagnosis Date Noted  . Altered behavior 05/10/2019  . Klebsiella cystitis 04/22/2019  . Metabolic encephalopathy [id number]  . Back pain 04/08/2019  . MCI (mild cognitive impairment) 03/07/2019  . Congestive heart failure (CHF) ([place]) 01/20/2019  . CAD (coronary artery disease) 06/07/2018  . Pulmonary hypertension, primary ([place]) 06/07/2018  . Generalized weakness   . Abnormal LFTs 06/01/2018  . Hallucination 06/01/2018  . Abnormal chest x-ray 09/28/2015  . Edema of right lower extremity 09/28/2015  . Elevated brain natriuretic peptide (BNP) level 09/17/2015  . Seizures ([place])  09/16/2015  . Asthma 08/14/2015  . PCP NOTES >>>>>>>>>>>>>>>>>>>>>>>>>>>>>> 04/24/2015  . TIA (transient ischemic attack) 12/11/2014  . Dyslipidemia 12/11/2014  . History of CVA (cerebrovascular accident) 12/01/2014  . Thrombocytopenia ([place]) 12/01/2014  . Chronic renal insufficiency, stage II (mild) 11/12/2013  . Impingement syndrome, shoulder, left 11/09/2013  . Acquired short bowel syndrome 11/09/2013  . Annual physical exam 08/30/2013  . Hx pulmonary embolism 06/27/2013  . Warfarin anticoagulation 06/27/2013  . DDD (degenerative disc disease), lumbar 11/18/2012  . ATRIAL FIBRILLATION   07/08/2010  . COLON CANCER, HX OF 07/08/2010  . DIZZINESS 10/18/2009  . Essential hypertension 12/16/2007  . HIP PAIN, CHRONIC 12/28/2006  . ANEMIA-NOS 07/15/2006  . Personal history of venous thrombosis and embolism 07/15/2006    CMP     Component Value Date/Time   NA 146 07/24/2019 0000   NA  142 06/09/2012 1509   K 4.4 07/24/2019 0000   K 3.9 06/09/2012 1509   CL 112 (A) 07/24/2019 0000   CL 108 (H) 06/09/2012 1509   CO2 23 (A) 07/24/2019 0000   CO2 26 06/09/2012 1509   GLUCOSE 103 (H) 04/14/2019 0532   GLUCOSE 103 (H) 06/09/2012 1509   BUN 15 07/24/2019 0000   BUN 17.0 06/09/2012 1509   CREATININE 0.8 07/24/2019 0000   CREATININE 1.13 (H) 04/14/2019 0532   CREATININE 1.51 (H) 07/22/2018 1624   CREATININE 1.2 (H) 06/09/2012 1509   CALCIUM 9.3 07/24/2019 0000   CALCIUM 9.5 06/09/2012 1509   PROT 6.9 04/07/2019 1530   PROT 6.9 01/13/2018 1620   PROT 7.2 06/09/2012 1509   ALBUMIN 4.7 04/07/2019 1530   ALBUMIN 4.6 01/13/2018 1620   ALBUMIN 4.2 06/09/2012 1509   AST 31 04/07/2019 1530   AST 17 06/09/2012 1509   ALT 15 04/07/2019 1530   ALT 11 06/09/2012 1509   ALKPHOS 56 04/07/2019 1530   ALKPHOS 47 06/09/2012 1509   BILITOT 1.4 (H) 04/07/2019 1530   BILITOT 1.1 01/13/2018 1620   BILITOT 0.73 06/09/2012 1509   GFRNONAA 63.85 07/24/2019 0000   GFRAA 74 07/24/2019 0000   Recent Labs    01/22/19 0405 01/22/19 0405 01/24/19 0432 03/07/19 1454 04/08/19 0021 04/09/19 0456 04/11/19 0556 04/11/19 0556 04/12/19 0536 04/12/19 0536 04/14/19 0532 05/18/19 0000 07/03/19 0000 07/14/19 0000 07/24/19 0000  NA 142   < > 144   < > 144   < > 148*   < > 147*   < > 145   < > 143 146 146  K 3.2*   < > 4.7   < > 3.5   < > 4.3   < > 4.1   < > 3.8   < > 4.0 4.7 4.4  CL 105   < > 112*   < > 107   < > 111   < > 108   < > 109   < > 110* 113* 112*  CO2 27   < > 24   < > 27   < > 27   < > 25   < > 24   < > 23* 18 23*  GLUCOSE 92   < > 107*   < > 114*   < > 87  --  93  --  103*  --   --   --   --   BUN 24*   < > 25*   < > 41*   < > 31*   < > 29*   < > 26*   < >  29* 19 15  CREATININE 1.41*   < > 1.33*   < > 1.96*   < > 1.28*   < > 1.26*   < > 1.13*   < > 1.1 0.9 0.8  CALCIUM 8.5*   < > 9.4   < > 9.2   < > 9.0   < > 9.3   < > 9.4   < > 9.5 9.7 9.3  MG 2.1  --  2.0  --  2.2  --   --    --   --   --   --   --   --   --   --   PHOS 3.4  --   --   --  4.0  --   --   --   --   --   --   --   --   --   --    < > = values in this interval not displayed.   Recent Labs    01/20/19 0918 03/07/19 1454 04/07/19 1530  AST 25 17 31   ALT 15 10 15   ALKPHOS 41 43 56  BILITOT 0.7 0.8 1.4*  PROT 5.9* 6.6 6.9  ALBUMIN 3.8 4.1 4.7   Recent Labs    04/07/19 1530 04/07/19 1530 04/08/19 0021 04/08/19 0021 04/12/19 0536 05/18/19 0000 07/14/19 0000  WBC 8.2   < > 6.9   < > 5.3 4.6 3.6  NEUTROABS 6.5  --   --   --   --  3 2  HGB 13.3   < > 12.7   < > 12.9 12.2 12.4  HCT 41.2   < > 40.0   < > 42.0 36 37  MCV 95.2  --  97.6  --  100.0  --   --   PLT 75*   < > 76*   < > 70* 84* 60*   < > = values in this interval not displayed.   No results for input(s): CHOL, LDLCALC, TRIG in the last 8760 hours.  Invalid input(s): HCL No results found for: [facility] Lab Results  Component Value Date   TSH 3.58 08/22/2018   Lab Results  Component Value Date   HGBA1C 5.6 11/13/2013   Lab Results  Component Value Date   CHOL 97 08/16/2017   HDL 37.70 (L) 08/16/2017   LDLCALC 48 08/16/2017   TRIG 56.0 08/16/2017   CHOLHDL 3 08/16/2017    Significant Diagnostic Results in last 30 days:  No results found.  Assessment and Plan  No problem-specific Assessment & Plan notes found for this encounter.   Labs/tests ordered:    [name], MD

## 2019-08-21 NOTE — Progress Notes (Signed)
Location:  [facility] Room Number: 212-D Place of Service:  SNF (31)  Paz, [name], MD  Patient Care Team: Colon Branch, MD as PCP - General (Internal Medicine)  Extended Emergency Contact Information Primary Emergency Contact: [name],[name] Address: [address]          [place], [place] [address] [name] of [place] Phone: [phone] Mobile Phone: [phone] Relation: Daughter    Allergies: Patient has no known allergies.  Chief Complaint  Patient presents with  . Anticoagulation    INR management    HPI: Patient is a 86 y.o. female atrial fibrillation, on Coumadin is being seen for an INR check.  Patient's INR is subtherapeutic.  Past Medical History:  Diagnosis Date  . Acute encephalopathy   . Acute renal failure superimposed on stage 3 chronic kidney disease ([place])   . Anemia   . Atrial fibrillation ([place])   . Colon cancer ([place]) 12/11  . Colon cancer ([place]) 2011  . Diarrhea   . DVT (deep venous thrombosis) ([place]) 11/06  . Elevated brain natriuretic peptide (BNP) level   . Frequent headaches   . Heart murmur   . Hyperkalemia   . Hypertension   . Pulmonary embolism ([place]) 11/06  . Seizures ([place])   . Stroke ([place]) 11/30/2014  . Urine retention 01/2019    Past Surgical History:  Procedure Laterality Date  . APPENDECTOMY  1952  . COLECTOMY  06/2010   partial, Dr. Donne Hazel complicated by LLE CVT; S/P IVC umbrella & anemia  . HIP FRACTURE SURGERY  2006   Trauma  . TOTAL ABDOMINAL HYSTERECTOMY W/ BILATERAL SALPINGOOPHORECTOMY  1973   For Fibroids  . TOTAL HIP ARTHROPLASTY  01/03/07   Left hip replacement.  Marland Kitchen VENA CAVA FILTER PLACEMENT  06/28/2010    Allergies as of 08/21/2019   No Known Allergies     Medication List       Accurate as of August 21, 2019 11:59 PM. If you have any questions, ask your nurse or doctor.        acetaminophen 500 MG tablet Commonly known as: TYLENOL Take 1,000 mg by mouth  every 8 (eight) hours as needed for headache (pain).   albuterol (2.5 MG/3ML) 0.083% nebulizer solution Commonly known as: PROVENTIL Take 2.5 mg by nebulization every 6 (six) hours as needed for wheezing or shortness of breath.   albuterol 108 (90 Base) MCG/ACT inhaler Commonly known as: VENTOLIN HFA Inhale 2 puffs into the lungs every 6 (six) hours as needed for wheezing or shortness of breath.   aspirin 81 MG chewable tablet Chew 81 mg by mouth daily.   atorvastatin 20 MG tablet Commonly known as: LIPITOR Take 1 tablet (20 mg total) by mouth daily.   Dulera 100-5 MCG/ACT Aero Generic drug: mometasone-formoterol Inhale 2 puffs into the lungs 2 (two) times daily.   Ensure Take 237 mLs by mouth 3 (three) times daily between meals.   escitalopram 5 MG tablet Commonly known as: LEXAPRO Take 5 mg by mouth daily.   isosorbide mononitrate 30 MG 24 hr tablet Commonly known as: IMDUR Take 0.5 tablets (15 mg total) by mouth daily.   loratadine 10 MG tablet Commonly known as: CLARITIN Take 1 tablet (10 mg total) by mouth daily as needed (seasonal allergies).   metoprolol tartrate 25 MG tablet Commonly known as: LOPRESSOR Take 1.5 tablets (37.5 mg total) by mouth 2 (two) times daily.   multivitamin with minerals Tabs tablet Take 1  tablet by mouth daily.   NON FORMULARY Diet order: Regular liberalized diet d/t poor intake.   OYSTER SHELL/VITAMIN D PO Take 1 tablet by mouth daily. With breakfast   polyethylene glycol 17 g packet Commonly known as: MIRALAX / GLYCOLAX Take 17 g by mouth daily as needed.   promethazine 12.5 MG tablet Commonly known as: PHENERGAN Take 1 tablet (12.5 mg total) by mouth 3 (three) times daily as needed for nausea or vomiting.   QUEtiapine 25 MG tablet Commonly known as: SEROQUEL Take 2 tablets (50 mg total) by mouth at bedtime.   tamsulosin 0.4 MG Caps capsule Commonly known as: FLOMAX Take 1 capsule (0.4 mg total) by mouth daily.    verapamil 120 MG 24 hr capsule Commonly known as: VERELAN PM Take 120 mg by mouth at bedtime.   warfarin 2 MG tablet Commonly known as: COUMADIN Take as directed by the anticoagulation clinic. If you are unsure how to take this medication, talk to your nurse or doctor. Original instructions: Take 2 mg by mouth See admin instructions. Take daily except Wednesdays and Fridays   warfarin 2.5 MG tablet Commonly known as: COUMADIN Take as directed by the anticoagulation clinic. If you are unsure how to take this medication, talk to your nurse or doctor. Original instructions: Take 2.5 mg by mouth See admin instructions. Take Wednesdays and Fridays       No orders of the defined types were placed in this encounter.   Immunization History  Administered Date(s) Administered  . Fluad Quad(high Dose 65+) 03/07/2019  . Influenza Split 03/30/2012, 03/07/2019  . Influenza Whole 04/30/2006, 04/20/2008, 04/15/2009  . Influenza, High Dose Seasonal PF 04/20/2013, 03/27/2015, 03/31/2016, 04/22/2017, 04/20/2018  . Influenza,inj,Quad PF,6+ Mos 05/23/2014  . Moderna SARS-COVID-2 Vaccination 07/06/2019, 08/03/2019  . Pneumococcal Conjugate-13 04/24/2015  . Pneumococcal Polysaccharide-23 07/06/2009  . Td 10/30/2016    Social History   Tobacco Use  . Smoking status: Passive Smoke Exposure - Never Smoker  . Smokeless tobacco: Never Used  . Tobacco comment: husband smoked in home.   Substance Use Topics  . Alcohol use: No    Alcohol/week: 0.0 standard drinks     Vitals:   08/21/19 1549  BP: (!) 148/66  Pulse: 69  Resp: 18  Temp: (!) 97.3 F (36.3 C)   Body mass index is 28.27 kg/m.   Patient Active Problem List   Diagnosis Date Noted  . Altered behavior 05/10/2019  . Klebsiella cystitis 04/22/2019  . Metabolic encephalopathy [id number]  . Back pain 04/08/2019  . MCI (mild cognitive impairment) 03/07/2019  . Congestive heart failure (CHF) ([place]) 01/20/2019  . CAD (coronary  artery disease) 06/07/2018  . Pulmonary hypertension, primary ([place]) 06/07/2018  . Generalized weakness   . Abnormal LFTs 06/01/2018  . Hallucination 06/01/2018  . Abnormal chest x-ray 09/28/2015  . Edema of right lower extremity 09/28/2015  . Elevated brain natriuretic peptide (BNP) level 09/17/2015  . Seizures ([place]) 09/16/2015  . Asthma 08/14/2015  . PCP NOTES >>>>>>>>>>>>>>>>>>>>>>>>>>>>>> 04/24/2015  . TIA (transient ischemic attack) 12/11/2014  . Dyslipidemia 12/11/2014  . History of CVA (cerebrovascular accident) 12/01/2014  . Thrombocytopenia ([place]) 12/01/2014  . Chronic renal insufficiency, stage II (mild) 11/12/2013  . Impingement syndrome, shoulder, left 11/09/2013  . Acquired short bowel syndrome 11/09/2013  . Annual physical exam 08/30/2013  . Hx pulmonary embolism 06/27/2013  . Warfarin anticoagulation 06/27/2013  . DDD (degenerative disc disease), lumbar 11/18/2012  . ATRIAL FIBRILLATION   07/08/2010  . COLON CANCER, HX OF  07/08/2010  . DIZZINESS 10/18/2009  . Essential hypertension 12/16/2007  . HIP PAIN, CHRONIC 12/28/2006  . ANEMIA-NOS 07/15/2006  . Personal history of venous thrombosis and embolism 07/15/2006    CMP     Component Value Date/Time   NA 138 08/11/2019 0000   NA 142 06/09/2012 1509   K 5.1 08/11/2019 0000   K 3.9 06/09/2012 1509   CL 108 08/11/2019 0000   CL 108 (H) 06/09/2012 1509   CO2 21 08/11/2019 0000   CO2 26 06/09/2012 1509   GLUCOSE 103 (H) 04/14/2019 0532   GLUCOSE 103 (H) 06/09/2012 1509   BUN 35 (A) 08/11/2019 0000   BUN 17.0 06/09/2012 1509   CREATININE 1.2 (A) 08/11/2019 0000   CREATININE 1.13 (H) 04/14/2019 0532   CREATININE 1.51 (H) 07/22/2018 1624   CREATININE 1.2 (H) 06/09/2012 1509   CALCIUM 8.9 08/11/2019 0000   CALCIUM 9.5 06/09/2012 1509   PROT 6.9 04/07/2019 1530   PROT 6.9 01/13/2018 1620   PROT 7.2 06/09/2012 1509   ALBUMIN 4.7 04/07/2019 1530   ALBUMIN 4.6 01/13/2018 1620   ALBUMIN 4.2 06/09/2012 1509    AST 31 04/07/2019 1530   AST 17 06/09/2012 1509   ALT 15 04/07/2019 1530   ALT 11 06/09/2012 1509   ALKPHOS 56 04/07/2019 1530   ALKPHOS 47 06/09/2012 1509   BILITOT 1.4 (H) 04/07/2019 1530   BILITOT 1.1 01/13/2018 1620   BILITOT 0.73 06/09/2012 1509   GFRNONAA 41.35 08/11/2019 0000   GFRAA 47.92 08/11/2019 0000   Recent Labs    01/22/19 0405 01/22/19 0405 01/24/19 0432 03/07/19 1454 04/08/19 0021 04/09/19 0456 04/11/19 0556 04/11/19 0556 04/12/19 0536 04/12/19 0536 04/14/19 0532 05/18/19 0000 07/14/19 0000 07/24/19 0000 08/11/19 0000  NA 142   < > 144   < > 144   < > 148*   < > 147*   < > 145   < > 146 146 138  K 3.2*   < > 4.7   < > 3.5   < > 4.3   < > 4.1   < > 3.8   < > 4.7 4.4 5.1  CL 105   < > 112*   < > 107   < > 111   < > 108   < > 109   < > 113* 112* 108  CO2 27   < > 24   < > 27   < > 27   < > 25   < > 24   < > 18 23* 21  GLUCOSE 92   < > 107*   < > 114*   < > 87  --  93  --  103*  --   --   --   --   BUN 24*   < > 25*   < > 41*   < > 31*   < > 29*   < > 26*   < > 19 15 35*  CREATININE 1.41*   < > 1.33*   < > 1.96*   < > 1.28*   < > 1.26*   < > 1.13*   < > 0.9 0.8 1.2*  CALCIUM 8.5*   < > 9.4   < > 9.2   < > 9.0   < > 9.3   < > 9.4   < > 9.7 9.3 8.9  MG 2.1  --  2.0  --  2.2  --   --   --   --   --   --   --   --   --   --  PHOS 3.4  --   --   --  4.0  --   --   --   --   --   --   --   --   --   --    < > = values in this interval not displayed.   Recent Labs    01/20/19 0918 03/07/19 1454 04/07/19 1530  AST 25 17 31   ALT 15 10 15   ALKPHOS 41 43 56  BILITOT 0.7 0.8 1.4*  PROT 5.9* 6.6 6.9  ALBUMIN 3.8 4.1 4.7   Recent Labs    04/07/19 1530 04/07/19 1530 04/08/19 0021 04/08/19 0021 04/12/19 0536 04/12/19 0536 05/18/19 0000 07/14/19 0000 08/11/19 0000  WBC 8.2   < > 6.9   < > 5.3  --  4.6 3.6 5.2  NEUTROABS 6.5  --   --   --   --   --  3 2 4   HGB 13.3   < > 12.7   < > 12.9   < > 12.2 12.4 12.4  HCT 41.2   < > 40.0   < > 42.0   < > 36 37 37   MCV 95.2  --  97.6  --  100.0  --   --   --   --   PLT 75*   < > 76*   < > 70*   < > 84* 60* 69*   < > = values in this interval not displayed.   No results for input(s): CHOL, LDLCALC, TRIG in the last 8760 hours.  Invalid input(s): HCL No results found for: [facility] Lab Results  Component Value Date   TSH 3.58 08/22/2018   Lab Results  Component Value Date   HGBA1C 5.6 11/13/2013   Lab Results  Component Value Date   CHOL 97 08/16/2017   HDL 37.70 (L) 08/16/2017   LDLCALC 48 08/16/2017   TRIG 56.0 08/16/2017   CHOLHDL 3 08/16/2017    Significant Diagnostic Results in last 30 days:  No results found.  Assessment and Plan  Atrial fibrillation/anticoagulated on Coumadin-patient's INR is subtherapeutic.  Will increase to Coumadin 2.5 mg on Wednesday and Friday and 2 mg all other days and recheck INR in 1 week on 2/22     [name] , MD

## 2019-08-22 DIAGNOSIS — M6281 Muscle weakness (generalized): Secondary | ICD-10-CM | POA: Diagnosis not present

## 2019-08-22 DIAGNOSIS — R2681 Unsteadiness on feet: Secondary | ICD-10-CM | POA: Diagnosis not present

## 2019-08-22 DIAGNOSIS — G9341 Metabolic encephalopathy: Secondary | ICD-10-CM | POA: Diagnosis not present

## 2019-08-26 ENCOUNTER — Encounter: Payer: Self-pay | Admitting: Internal Medicine

## 2019-08-28 ENCOUNTER — Non-Acute Institutional Stay (SKILLED_NURSING_FACILITY): Payer: Medicare Other | Admitting: Internal Medicine

## 2019-08-28 ENCOUNTER — Encounter: Payer: Self-pay | Admitting: Internal Medicine

## 2019-08-28 DIAGNOSIS — Z86718 Personal history of other venous thrombosis and embolism: Secondary | ICD-10-CM | POA: Diagnosis not present

## 2019-08-28 DIAGNOSIS — Z7901 Long term (current) use of anticoagulants: Secondary | ICD-10-CM | POA: Diagnosis not present

## 2019-08-28 DIAGNOSIS — Z86711 Personal history of pulmonary embolism: Secondary | ICD-10-CM

## 2019-08-28 NOTE — Progress Notes (Signed)
Location:  [facility] Room Number: 212-D Place of Service:  SNF (31)  Paz, [name], MD  Patient Care Team: Colon Branch, MD as PCP - General (Internal Medicine)  Extended Emergency Contact Information Primary Emergency Contact: [name],[name] Address: [address]          [place], [place] [address] [name] of [place] Phone: [phone] Mobile Phone: [phone] Relation: Daughter    Allergies: Patient has no known allergies.  Chief Complaint  Patient presents with  . Anticoagulation    INR    HPI: Patient is a 86 y.o. female with chronic DVT/PE who is being seen for an INR check.  Today her INR is 1.6 which is not therapeutic.  Past Medical History:  Diagnosis Date  . Acute encephalopathy   . Acute renal failure superimposed on stage 3 chronic kidney disease ([place])   . Anemia   . Atrial fibrillation ([place])   . Colon cancer ([place]) 12/11  . Colon cancer ([place]) 2011  . Diarrhea   . DVT (deep venous thrombosis) ([place]) 11/06  . Elevated brain natriuretic peptide (BNP) level   . Frequent headaches   . Heart murmur   . Hyperkalemia   . Hypertension   . Pulmonary embolism ([place]) 11/06  . Seizures ([place])   . Stroke ([place]) 11/30/2014  . Urine retention 01/2019    Past Surgical History:  Procedure Laterality Date  . APPENDECTOMY  1952  . COLECTOMY  06/2010   partial, Dr. Donne Hazel complicated by LLE CVT; S/P IVC umbrella & anemia  . HIP FRACTURE SURGERY  2006   Trauma  . TOTAL ABDOMINAL HYSTERECTOMY W/ BILATERAL SALPINGOOPHORECTOMY  1973   For Fibroids  . TOTAL HIP ARTHROPLASTY  01/03/07   Left hip replacement.  Marland Kitchen VENA CAVA FILTER PLACEMENT  06/28/2010    Allergies as of 08/28/2019   No Known Allergies     Medication List       Accurate as of August 28, 2019 11:59 PM. If you have any questions, ask your nurse or doctor.        acetaminophen 500 MG tablet Commonly known as: TYLENOL Take 1,000 mg by mouth every 8  (eight) hours as needed for headache (pain).   albuterol (2.5 MG/3ML) 0.083% nebulizer solution Commonly known as: PROVENTIL Take 2.5 mg by nebulization every 6 (six) hours as needed for wheezing or shortness of breath.   albuterol 108 (90 Base) MCG/ACT inhaler Commonly known as: VENTOLIN HFA Inhale 2 puffs into the lungs every 6 (six) hours as needed for wheezing or shortness of breath.   aspirin 81 MG chewable tablet Chew 81 mg by mouth daily.   atorvastatin 20 MG tablet Commonly known as: LIPITOR Take 1 tablet (20 mg total) by mouth daily.   Dulera 100-5 MCG/ACT Aero Generic drug: mometasone-formoterol Inhale 2 puffs into the lungs 2 (two) times daily.   Ensure Take 237 mLs by mouth 3 (three) times daily between meals.   escitalopram 5 MG tablet Commonly known as: LEXAPRO Take 5 mg by mouth daily.   isosorbide mononitrate 30 MG 24 hr tablet Commonly known as: IMDUR Take 0.5 tablets (15 mg total) by mouth daily.   loratadine 10 MG tablet Commonly known as: CLARITIN Take 1 tablet (10 mg total) by mouth daily as needed (seasonal allergies).   metoprolol tartrate 25 MG tablet Commonly known as: LOPRESSOR Take 1.5 tablets (37.5 mg total) by mouth 2 (two) times daily.   multivitamin with minerals  Tabs tablet Take 1 tablet by mouth daily.   NON FORMULARY Diet order: Regular liberalized diet d/t poor intake.   OYSTER SHELL/VITAMIN D PO Take 1 tablet by mouth daily. With breakfast   polyethylene glycol 17 g packet Commonly known as: MIRALAX / GLYCOLAX Take 17 g by mouth daily as needed.   promethazine 12.5 MG tablet Commonly known as: PHENERGAN Take 1 tablet (12.5 mg total) by mouth 3 (three) times daily as needed for nausea or vomiting.   QUEtiapine 25 MG tablet Commonly known as: SEROQUEL Take 2 tablets (50 mg total) by mouth at bedtime.   tamsulosin 0.4 MG Caps capsule Commonly known as: FLOMAX Take 1 capsule (0.4 mg total) by mouth daily.   verapamil 120  MG 24 hr capsule Commonly known as: VERELAN PM Take 120 mg by mouth at bedtime.   warfarin 2.5 MG tablet Commonly known as: COUMADIN Take as directed by the anticoagulation clinic. If you are unsure how to take this medication, talk to your nurse or doctor. Original instructions: Take 2.5 mg by mouth every Monday, Wednesday, and Friday.   warfarin 2 MG tablet Commonly known as: COUMADIN Take as directed by the anticoagulation clinic. If you are unsure how to take this medication, talk to your nurse or doctor. Original instructions: Take 2 mg by mouth See admin instructions. Take daily except M-W-F       No orders of the defined types were placed in this encounter.   Immunization History  Administered Date(s) Administered  . Fluad Quad(high Dose 65+) 03/07/2019  . Influenza Split 03/30/2012, 03/07/2019  . Influenza Whole 04/30/2006, 04/20/2008, 04/15/2009  . Influenza, High Dose Seasonal PF 04/20/2013, 03/27/2015, 03/31/2016, 04/22/2017, 04/20/2018  . Influenza,inj,Quad PF,6+ Mos 05/23/2014  . Moderna SARS-COVID-2 Vaccination 07/06/2019, 08/03/2019  . Pneumococcal Conjugate-13 04/24/2015  . Pneumococcal Polysaccharide-23 07/06/2009  . Td 10/30/2016    Social History   Tobacco Use  . Smoking status: Passive Smoke Exposure - Never Smoker  . Smokeless tobacco: Never Used  . Tobacco comment: husband smoked in home.   Substance Use Topics  . Alcohol use: No    Alcohol/week: 0.0 standard drinks     Vitals:   08/28/19 1626  BP: 131/66  Pulse: 72  Resp: 18  Temp: (!) 97 F (36.1 C)   Body mass index is 28.01 kg/m.   Patient Active Problem List   Diagnosis Date Noted  . Altered behavior 05/10/2019  . Klebsiella cystitis 04/22/2019  . Metabolic encephalopathy [id number]  . Back pain 04/08/2019  . MCI (mild cognitive impairment) 03/07/2019  . Congestive heart failure (CHF) ([place]) 01/20/2019  . CAD (coronary artery disease) 06/07/2018  . Pulmonary hypertension,  primary ([place]) 06/07/2018  . Generalized weakness   . Abnormal LFTs 06/01/2018  . Hallucination 06/01/2018  . Abnormal chest x-ray 09/28/2015  . Edema of right lower extremity 09/28/2015  . Elevated brain natriuretic peptide (BNP) level 09/17/2015  . Seizures ([place]) 09/16/2015  . Asthma 08/14/2015  . PCP NOTES >>>>>>>>>>>>>>>>>>>>>>>>>>>>>> 04/24/2015  . TIA (transient ischemic attack) 12/11/2014  . Dyslipidemia 12/11/2014  . History of CVA (cerebrovascular accident) 12/01/2014  . Thrombocytopenia ([place]) 12/01/2014  . Chronic renal insufficiency, stage II (mild) 11/12/2013  . Impingement syndrome, shoulder, left 11/09/2013  . Acquired short bowel syndrome 11/09/2013  . Annual physical exam 08/30/2013  . Hx pulmonary embolism 06/27/2013  . Warfarin anticoagulation 06/27/2013  . DDD (degenerative disc disease), lumbar 11/18/2012  . ATRIAL FIBRILLATION   07/08/2010  . COLON CANCER, HX OF 07/08/2010  .  DIZZINESS 10/18/2009  . Essential hypertension 12/16/2007  . HIP PAIN, CHRONIC 12/28/2006  . ANEMIA-NOS 07/15/2006  . Personal history of venous thrombosis and embolism 07/15/2006    CMP     Component Value Date/Time   NA 138 08/11/2019 0000   NA 142 06/09/2012 1509   K 5.1 08/11/2019 0000   K 3.9 06/09/2012 1509   CL 108 08/11/2019 0000   CL 108 (H) 06/09/2012 1509   CO2 21 08/11/2019 0000   CO2 26 06/09/2012 1509   GLUCOSE 103 (H) 04/14/2019 0532   GLUCOSE 103 (H) 06/09/2012 1509   BUN 35 (A) 08/11/2019 0000   BUN 17.0 06/09/2012 1509   CREATININE 1.2 (A) 08/11/2019 0000   CREATININE 1.13 (H) 04/14/2019 0532   CREATININE 1.51 (H) 07/22/2018 1624   CREATININE 1.2 (H) 06/09/2012 1509   CALCIUM 8.9 08/11/2019 0000   CALCIUM 9.5 06/09/2012 1509   PROT 6.9 04/07/2019 1530   PROT 6.9 01/13/2018 1620   PROT 7.2 06/09/2012 1509   ALBUMIN 4.7 04/07/2019 1530   ALBUMIN 4.6 01/13/2018 1620   ALBUMIN 4.2 06/09/2012 1509   AST 31 04/07/2019 1530   AST 17 06/09/2012 1509   ALT  15 04/07/2019 1530   ALT 11 06/09/2012 1509   ALKPHOS 56 04/07/2019 1530   ALKPHOS 47 06/09/2012 1509   BILITOT 1.4 (H) 04/07/2019 1530   BILITOT 1.1 01/13/2018 1620   BILITOT 0.73 06/09/2012 1509   GFRNONAA 41.35 08/11/2019 0000   GFRAA 47.92 08/11/2019 0000   Recent Labs    01/22/19 0405 01/22/19 0405 01/24/19 0432 03/07/19 1454 04/08/19 0021 04/09/19 0456 04/11/19 0556 04/11/19 0556 04/12/19 0536 04/12/19 0536 04/14/19 0532 05/18/19 0000 07/14/19 0000 07/24/19 0000 08/11/19 0000  NA 142   < > 144   < > 144   < > 148*   < > 147*   < > 145   < > 146 146 138  K 3.2*   < > 4.7   < > 3.5   < > 4.3   < > 4.1   < > 3.8   < > 4.7 4.4 5.1  CL 105   < > 112*   < > 107   < > 111   < > 108   < > 109   < > 113* 112* 108  CO2 27   < > 24   < > 27   < > 27   < > 25   < > 24   < > 18 23* 21  GLUCOSE 92   < > 107*   < > 114*   < > 87  --  93  --  103*  --   --   --   --   BUN 24*   < > 25*   < > 41*   < > 31*   < > 29*   < > 26*   < > 19 15 35*  CREATININE 1.41*   < > 1.33*   < > 1.96*   < > 1.28*   < > 1.26*   < > 1.13*   < > 0.9 0.8 1.2*  CALCIUM 8.5*   < > 9.4   < > 9.2   < > 9.0   < > 9.3   < > 9.4   < > 9.7 9.3 8.9  MG 2.1  --  2.0  --  2.2  --   --   --   --   --   --   --   --   --   --  PHOS 3.4  --   --   --  4.0  --   --   --   --   --   --   --   --   --   --    < > = values in this interval not displayed.   Recent Labs    01/20/19 0918 03/07/19 1454 04/07/19 1530  AST 25 17 31   ALT 15 10 15   ALKPHOS 41 43 56  BILITOT 0.7 0.8 1.4*  PROT 5.9* 6.6 6.9  ALBUMIN 3.8 4.1 4.7   Recent Labs    04/07/19 1530 04/07/19 1530 04/08/19 0021 04/08/19 0021 04/12/19 0536 04/12/19 0536 05/18/19 0000 07/14/19 0000 08/11/19 0000  WBC 8.2   < > 6.9   < > 5.3  --  4.6 3.6 5.2  NEUTROABS 6.5  --   --   --   --   --  3 2 4   HGB 13.3   < > 12.7   < > 12.9   < > 12.2 12.4 12.4  HCT 41.2   < > 40.0   < > 42.0   < > 36 37 37  MCV 95.2  --  97.6  --  100.0  --   --   --   --     PLT 75*   < > 76*   < > 70*   < > 84* 60* 69*   < > = values in this interval not displayed.   No results for input(s): CHOL, LDLCALC, TRIG in the last 8760 hours.  Invalid input(s): HCL No results found for: [facility] Lab Results  Component Value Date   TSH 3.58 08/22/2018   Lab Results  Component Value Date   HGBA1C 5.6 11/13/2013   Lab Results  Component Value Date   CHOL 97 08/16/2017   HDL 37.70 (L) 08/16/2017   LDLCALC 48 08/16/2017   TRIG 56.0 08/16/2017   CHOLHDL 3 08/16/2017    Significant Diagnostic Results in last 30 days:  No results found.  Assessment and Plan  Chronic DVT/PE-INR today is 1.6; will change Coumadin to 2.5 mg Monday Wednesday Friday and 2 mg on every other day.  Patient's INR recently went out on a regimen not as liberal.  Repeat INR 1 week    [name] , MD

## 2019-09-02 ENCOUNTER — Encounter: Payer: Self-pay | Admitting: Internal Medicine

## 2019-09-04 ENCOUNTER — Non-Acute Institutional Stay (SKILLED_NURSING_FACILITY): Payer: Medicare Other | Admitting: Internal Medicine

## 2019-09-04 DIAGNOSIS — Z7901 Long term (current) use of anticoagulants: Secondary | ICD-10-CM

## 2019-09-04 DIAGNOSIS — Z86718 Personal history of other venous thrombosis and embolism: Secondary | ICD-10-CM

## 2019-09-04 DIAGNOSIS — I4811 Longstanding persistent atrial fibrillation: Secondary | ICD-10-CM

## 2019-09-06 ENCOUNTER — Non-Acute Institutional Stay (SKILLED_NURSING_FACILITY): Payer: Medicare Other | Admitting: Internal Medicine

## 2019-09-06 ENCOUNTER — Encounter: Payer: Self-pay | Admitting: Internal Medicine

## 2019-09-06 DIAGNOSIS — J42 Unspecified chronic bronchitis: Secondary | ICD-10-CM | POA: Diagnosis not present

## 2019-09-06 DIAGNOSIS — I25118 Atherosclerotic heart disease of native coronary artery with other forms of angina pectoris: Secondary | ICD-10-CM

## 2019-09-06 DIAGNOSIS — I5030 Unspecified diastolic (congestive) heart failure: Secondary | ICD-10-CM

## 2019-09-06 DIAGNOSIS — I4811 Longstanding persistent atrial fibrillation: Secondary | ICD-10-CM

## 2019-09-06 DIAGNOSIS — F0391 Unspecified dementia with behavioral disturbance: Secondary | ICD-10-CM | POA: Diagnosis not present

## 2019-09-06 DIAGNOSIS — F0392 Unspecified dementia, unspecified severity, with psychotic disturbance: Secondary | ICD-10-CM

## 2019-09-06 NOTE — Progress Notes (Signed)
Location:    [facility] Room Number: 212/D Place of Service:  SNF (31) Provider:  Lorne Skeens.  Colon Branch, MD  Patient Care Team: Colon Branch, MD as PCP - General (Internal Medicine)  Extended Emergency Contact Information Primary Emergency Contact: [facility] Address: [address]          [place], [place] [address] [name] of [place] Phone: [phone] Mobile Phone: [phone] Relation: Daughter  Code Status:  DNR Goals of care: Advanced Directive information Advanced Directives 09/06/2019  Does Patient Have a Medical Advance Directive? Yes  Type of Advance Directive Out of facility DNR (pink MOST or yellow form)  Does patient want to make changes to medical advance directive? No - Patient declined  Copy of [place] in Chart? -  Would patient like information on creating a medical advance directive? -  Pre-existing out of facility DNR order (yellow form or pink MOST form) Yellow form placed in chart (order not valid for inpatient use)     Chief Complaint  Patient presents with  . Medical Management of Chronic Issues    Routine visit of medical management  Medical management of chronic medical conditions including history of dementia with psychosis as well as diastolic CHF-history of pulmonary embolism and atrial fibrillation on chronic Coumadin as well as COPD-thrombocytopenia and history of hypernatremia.    HPI:  Pt is a 86 y.o. female seen today for medical management of chronic diseases.  As noted above.  Nursing does not report any recent acute issues.  At one point she appeared to have a case of pseudogout in her finger but this resolved with prednisone and has not reoccurred.  She is also on Coumadin with a history of pulmonary embolism and atrial fibrillation in the past INR was 1.8 on lab done earlier this week adjustments were made and update INR is pending.  At times she will  have somewhat elevated sodiums and at times will respond to fluid encouragement and other times does receive an IV although----  this is somewhat difficult since she at times would not keep an IV in because of some agitation  Staff continues to encourage fluids last sodium level was 138 approximately a month ago we will update this.  Her weight is stable at 156.4 pounds.  In regards to atrial fibrillation rate is controlled on Lopressor 37.5 mg twice a day as well as verapamil 120 mg a day.  She also continues on aspirin for history of coronary artery disease and is on a statin as well as Imdur this is been stable without complaints of chest pain or increasing edema.  Regards to COPD she continues on Dulera twice a day and does have albuterol nebulizers as needed.  At times she will complain of some shortness of breath but has not done this for a while.  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  Currently she is lying in bed comfortably she is pleasant appropriate appears to be doing well  Past Medical History:  Diagnosis Date  . Acute encephalopathy   . Acute renal failure superimposed on stage 3 chronic kidney disease ([facility])   . Anemia   . Atrial fibrillation ([place])   . Colon cancer ([place]) 12/11  . Colon cancer (  [place]) 2011  . Diarrhea   . DVT (deep venous thrombosis) ([place]) 11/06  . Elevated brain natriuretic peptide (BNP) level   . Frequent headaches   . Heart murmur   . Hyperkalemia   . Hypertension   . Pulmonary embolism ([place]) 11/06  . Seizures ([place])   . Stroke ([place]) 11/30/2014  . Urine retention 01/2019   Past Surgical History:  Procedure Laterality Date  . APPENDECTOMY  1952  . COLECTOMY  06/2010   partial, Dr. Donne Hazel complicated by LLE CVT; S/P IVC umbrella & anemia  . HIP FRACTURE SURGERY   2006   Trauma  . TOTAL ABDOMINAL HYSTERECTOMY W/ BILATERAL SALPINGOOPHORECTOMY  1973   For Fibroids  . TOTAL HIP ARTHROPLASTY  01/03/07   Left hip replacement.  Marland Kitchen VENA CAVA FILTER PLACEMENT  06/28/2010    No Known Allergies  Allergies as of 09/06/2019   No Known Allergies     Medication List       Accurate as of September 06, 2019  3:20 PM. If you have any questions, ask your nurse or doctor.        acetaminophen 500 MG tablet Commonly known as: TYLENOL Take 1,000 mg by mouth every 8 (eight) hours as needed for headache (pain).   albuterol (2.5 MG/3ML) 0.083% nebulizer solution Commonly known as: PROVENTIL Take 2.5 mg by nebulization every 6 (six) hours as needed for wheezing or shortness of breath.   albuterol 108 (90 Base) MCG/ACT inhaler Commonly known as: VENTOLIN HFA Inhale 2 puffs into the lungs every 6 (six) hours as needed for wheezing or shortness of breath.   aspirin 81 MG chewable tablet Chew 81 mg by mouth daily.   atorvastatin 20 MG tablet Commonly known as: LIPITOR Take 1 tablet (20 mg total) by mouth daily.   Dulera 100-5 MCG/ACT Aero Generic drug: mometasone-formoterol Inhale 2 puffs into the lungs 2 (two) times daily.   Ensure Take 237 mLs by mouth 3 (three) times daily between meals.   escitalopram 5 MG tablet Commonly known as: LEXAPRO Take 5 mg by mouth daily.   isosorbide mononitrate 30 MG 24 hr tablet Commonly known as: IMDUR Take 0.5 tablets (15 mg total) by mouth daily.   loratadine 10 MG tablet Commonly known as: CLARITIN Take 1 tablet (10 mg total) by mouth daily as needed (seasonal allergies).   metoprolol tartrate 25 MG tablet Commonly known as: LOPRESSOR Take 1.5 tablets (37.5 mg total) by mouth 2 (two) times daily.   multivitamin with minerals Tabs tablet Take 1 tablet by mouth daily.   NON FORMULARY Diet order: Regular liberalized diet d/t poor intake.   OYSTER SHELL/VITAMIN D PO Take 1 tablet by mouth daily. With breakfast    polyethylene glycol 17 g packet Commonly known as: MIRALAX / GLYCOLAX Take 17 g by mouth daily as needed.   promethazine 12.5 MG tablet Commonly known as: PHENERGAN Take 1 tablet (12.5 mg total) by mouth 3 (three) times daily as needed for nausea or vomiting.   QUEtiapine 25 MG tablet Commonly known as: SEROQUEL Take 2 tablets (50 mg total) by mouth at bedtime.   tamsulosin 0.4 MG Caps capsule Commonly known as: FLOMAX Take 1 capsule (0.4 mg total) by mouth daily.   verapamil 120 MG 24 hr capsule Commonly known as: VERELAN PM Take 120 mg by mouth at bedtime.   warfarin 2.5 MG tablet Commonly known as: COUMADIN Take as directed by the anticoagulation clinic. If you are unsure how to take this medication,  talk to your nurse or doctor. Original instructions: Take 2.5 mg by mouth every Monday, Wednesday, Friday, and Saturday at 6 PM.   warfarin 2 MG tablet Commonly known as: COUMADIN Take as directed by the anticoagulation clinic. If you are unsure how to take this medication, talk to your nurse or doctor. Original instructions: Take 2 mg by mouth See admin instructions. Take daily except M-W-[id number]       Review of Systems   General no complaints of fever chills or weight appears to be stable.  Skin is not really complaining of rashes or itching at times does have some bruising but this appears to be stable.  Head ears eyes nose mouth and throat is not complain of visual changes or sore throat.  Respiratory is not complain of being short of breath or having a cough does have history of COPD.  Cardiac does not complain of chest pain or increased edema.  GI does not complain of abdominal pain nausea vomiting diarrhea constipation.  GU is not complaining of any dysuria.  Musculoskeletal at times will complain of some joint pain and knee discomfort but it appears change in position will relieve this.  Neurologic does not complain of dizziness headache or syncope.  And  psych does not complain of being depressed or anxious apparently at times does have some agitation but lately I have not really witness this.    Immunization History  Administered Date(s) Administered  . Fluad Quad(high Dose 65+) 03/07/2019  . Influenza Split 03/30/2012, 03/07/2019  . Influenza Whole 04/30/2006, 04/20/2008, 04/15/2009  . Influenza, High Dose Seasonal PF 04/20/2013, 03/27/2015, 03/31/2016, 04/22/2017, 04/20/2018  . Influenza,inj,Quad PF,6+ Mos 05/23/2014  . Moderna SARS-COVID-2 Vaccination 07/06/2019, 08/03/2019  . Pneumococcal Conjugate-13 04/24/2015  . Pneumococcal Polysaccharide-23 07/06/2009  . Td 10/30/2016   Pertinent  Health Maintenance Due  Topic Date Due  . DEXA SCAN  02/27/2020 (Originally 11/26/1993)  . INFLUENZA VACCINE  Completed  . PNA vac Low Risk Adult  Completed   Fall Risk  07/22/2018 01/18/2018 10/30/2016 08/14/2015 04/24/2015  Falls in the past year? 1 No No No No  Number falls in past yr: 1 - - - -  Injury with Fall? 0 - - - -   Functional Status Survey:    Vitals:   09/06/19 1512  BP: (!) 139/53  Pulse: 61  Resp: 17  Temp: (!) 97.1 F (36.2 C)  TempSrc: Oral  SpO2: 96%  Weight: 156 lb 6.4 oz (70.9 kg)  Height: 5\' 3"  (1.6 m)   Body mass index is 27.71 kg/m. Physical Exam   In general this is a pleasant elderly female in no distress lying comfortably in bed.  Her skin is warm and dry.  Eyes visual acuity appears to be intact sclera and conjunctive are clear.  Oropharynx is clear mucous membranes appear pretty moist.  Chest is clear to auscultation with somewhat shallow air entry there is no labored breathing or overt congestion.  Heart is regular irregular rate and rhythm without murmur gallop or rub she has I would say trace lower extremity edema appears to be stable.  Abdomen is soft nontender with positive bowel sounds.  Musculoskeletal is able to move all extremities x4 it appears at baseline she does have some chronic  edema.-Osteoarthritic changes to her knees bilaterally this is cool to touch nonerythematous nontender  Neurologic appears grossly intact her speech is clear cannot appreciate lateralizing findings.  Psych she is pleasant and appropriate oriented to self  Labs reviewed:  September 04, 2019.  INR 1.8   Recent Labs    01/22/19 0405 01/22/19 0405 01/24/19 0432 03/07/19 1454 04/08/19 0021 04/09/19 0456 04/11/19 0556 04/11/19 0556 04/12/19 0536 04/12/19 0536 04/14/19 0532 05/18/19 0000 07/14/19 0000 07/24/19 0000 08/11/19 0000  NA 142   < > 144   < > 144   < > 148*   < > 147*   < > 145   < > 146 146 138  K 3.2*   < > 4.7   < > 3.5   < > 4.3   < > 4.1   < > 3.8   < > 4.7 4.4 5.1  CL 105   < > 112*   < > 107   < > 111   < > 108   < > 109   < > 113* 112* 108  CO2 27   < > 24   < > 27   < > 27   < > 25   < > 24   < > 18 23* 21  GLUCOSE 92   < > 107*   < > 114*   < > 87  --  93  --  103*  --   --   --   --   BUN 24*   < > 25*   < > 41*   < > 31*   < > 29*   < > 26*   < > 19 15 35*  CREATININE 1.41*   < > 1.33*   < > 1.96*   < > 1.28*   < > 1.26*   < > 1.13*   < > 0.9 0.8 1.2*  CALCIUM 8.5*   < > 9.4   < > 9.2   < > 9.0   < > 9.3   < > 9.4   < > 9.7 9.3 8.9  MG 2.1  --  2.0  --  2.2  --   --   --   --   --   --   --   --   --   --   PHOS 3.4  --   --   --  4.0  --   --   --   --   --   --   --   --   --   --    < > = values in this interval not displayed.   Recent Labs    01/20/19 0918 03/07/19 1454 04/07/19 1530  AST 25 17 31   ALT 15 10 15   ALKPHOS 41 43 56  BILITOT 0.7 0.8 1.4*  PROT 5.9* 6.6 6.9  ALBUMIN 3.8 4.1 4.7   Recent Labs    04/07/19 1530 04/07/19 1530 04/08/19 0021 04/08/19 0021 04/12/19 0536 04/12/19 0536 05/18/19 0000 07/14/19 0000 08/11/19 0000  WBC 8.2   < > 6.9   < > 5.3  --  4.6 3.6 5.2  NEUTROABS 6.5  --   --   --   --   --  3 2 4   HGB 13.3   < > 12.7   < > 12.9   < > 12.2 12.4 12.4  HCT 41.2   < > 40.0   < > 42.0   < > 36 37 37  MCV 95.2  --   97.6  --  100.0  --   --   --   --   PLT 75*   < >  76*   < > 70*   < > 84* 60* 69*   < > = values in this interval not displayed.   Lab Results  Component Value Date   TSH 3.58 08/22/2018   Lab Results  Component Value Date   HGBA1C 5.6 11/13/2013   Lab Results  Component Value Date   CHOL 97 08/16/2017   HDL 37.70 (L) 08/16/2017   LDLCALC 48 08/16/2017   TRIG 56.0 08/16/2017   CHOLHDL 3 08/16/2017    Significant Diagnostic Results in last 30 days:  No results found.  Assessment/Plan  #1 history of atrial fibrillation appears rate controlled on Lopressor 37.5 mg twice a day and verapamil 120 mg a day-continues on Coumadin for anticoagulation INR was slightly low at 1.8 earlier this week adjustments have been made and update INR is pending.  2.  History of pulmonary embolism as noted above she is on Coumadin for anticoagulation update INR pending.  3.  History of hypernatremia and this is intermittent-Lasix has been discontinued for some time-fluids are encouraged-at times will receive an IV although keeping this in as noted above is somewhat of a challenge.  At this point will update a metabolic panel continue to encourage fluids.  4.  History of diastolic CHF this appears to be stable weight is stable she does not have concerning edema or increased congestion-she has been off her Lasix now for some time.  At this point will monitor.  5.  History of dementia with psychosis this appears to be stable with occasional agitation per staff she is on Seroquel 50 mg nightly and Lexapro 5 mg a day.  She continues to be pleasant and appropriate today and usually is whenever I see here.  6.  History of COPD appears stable on Dulera twice daily and albuterol as needed lung exam was quite benign today.  7.  History of thrombocytopenia with some chronicity-platelet count on most recent lab was 69,000 which has been relatively baseline with recent values there is no evidence of increased  bruising from baseline or recent bleeding.  Will update a CBC.  8.  History of coronary artery disease appears stable she does not complain of being short of breath or having chest pain.  She is on aspirin 81 mg a day Imdur 15 mg a day and continues on the Lopressor 37.5 mg twice daily.  Again will update a CBC metabolic panel and a hemoglobin A1c since she is on Seroquel.  [id number]

## 2019-09-07 DIAGNOSIS — D649 Anemia, unspecified: Secondary | ICD-10-CM | POA: Diagnosis not present

## 2019-09-07 DIAGNOSIS — I10 Essential (primary) hypertension: Secondary | ICD-10-CM | POA: Diagnosis not present

## 2019-09-07 DIAGNOSIS — E119 Type 2 diabetes mellitus without complications: Secondary | ICD-10-CM | POA: Diagnosis not present

## 2019-09-07 LAB — CBC: RBC: 3.91 (ref 3.87–5.11)

## 2019-09-07 LAB — BASIC METABOLIC PANEL
BUN: 27 — AB (ref 4–21)
CO2: 26 — AB (ref 13–22)
Chloride: 112 — AB (ref 99–108)
Creatinine: 1 (ref 0.5–1.1)
Glucose: 81
Potassium: 4.5 (ref 3.4–5.3)
Sodium: 146 (ref 137–147)

## 2019-09-07 LAB — CBC AND DIFFERENTIAL
HCT: 36 (ref 36–46)
Hemoglobin: 11.9 — AB (ref 12.0–16.0)
Neutrophils Absolute: 2
Platelets: 63 — AB (ref 150–399)
WBC: 3.4

## 2019-09-07 LAB — HEMOGLOBIN A1C: Hemoglobin A1C: 5.3

## 2019-09-07 LAB — COMPREHENSIVE METABOLIC PANEL
Calcium: 9 (ref 8.7–10.7)
GFR calc Af Amer: 56.62
GFR calc non Af Amer: 48.86

## 2019-09-10 ENCOUNTER — Encounter: Payer: Self-pay | Admitting: Internal Medicine

## 2019-09-10 NOTE — Progress Notes (Signed)
Location:      Place of Service:     Colon Branch, MD  Patient Care Team: Colon Branch, MD as PCP - General (Internal Medicine)  Extended Emergency Contact Information Primary Emergency Contact: [facility] Address: [address]          [place], [place] [address] [name] of [place] Phone: [phone] Mobile Phone: [phone] Relation: Daughter    Allergies: Patient has no known allergies.  Chief Complaint  Patient presents with  . Acute Visit    HPI: Patient is 86 y.o. female with atrial fibrillation history of PE, anticoagulated on Coumadin, who is being seen for INR check.  Patient's INR is 1.8.  Past Medical History:  Diagnosis Date  . Acute encephalopathy   . Acute renal failure superimposed on stage 3 chronic kidney disease ([place])   . Anemia   . Atrial fibrillation ([place])   . Colon cancer ([place]) 12/11  . Colon cancer ([place]) 2011  . Diarrhea   . DVT (deep venous thrombosis) ([place]) 11/06  . Elevated brain natriuretic peptide (BNP) level   . Frequent headaches   . Heart murmur   . Hyperkalemia   . Hypertension   . Pulmonary embolism ([place]) 11/06  . Seizures ([place])   . Stroke ([place]) 11/30/2014  . Urine retention 01/2019    Past Surgical History:  Procedure Laterality Date  . APPENDECTOMY  1952  . COLECTOMY  06/2010   partial, Dr. Donne Hazel complicated by LLE CVT; S/P IVC umbrella & anemia  . HIP FRACTURE SURGERY  2006   Trauma  . TOTAL ABDOMINAL HYSTERECTOMY W/ BILATERAL SALPINGOOPHORECTOMY  1973   For Fibroids  . TOTAL HIP ARTHROPLASTY  01/03/07   Left hip replacement.  Marland Kitchen VENA CAVA FILTER PLACEMENT  06/28/2010    Allergies as of 09/04/2019   No Known Allergies     Medication List       Accurate as of September 04, 2019 11:59 PM. If you have any questions, ask your nurse or doctor.        acetaminophen 500 MG tablet Commonly known as: TYLENOL Take 1,000 mg by mouth every 8 (eight) hours as needed for headache (pain).   albuterol  (2.5 MG/3ML) 0.083% nebulizer solution Commonly known as: PROVENTIL Take 2.5 mg by nebulization every 6 (six) hours as needed for wheezing or shortness of breath.   albuterol 108 (90 Base) MCG/ACT inhaler Commonly known as: VENTOLIN HFA Inhale 2 puffs into the lungs every 6 (six) hours as needed for wheezing or shortness of breath.   aspirin 81 MG chewable tablet Chew 81 mg by mouth daily.   atorvastatin 20 MG tablet Commonly known as: LIPITOR Take 1 tablet (20 mg total) by mouth daily.   Dulera 100-5 MCG/ACT Aero Generic drug: mometasone-formoterol Inhale 2 puffs into the lungs 2 (two) times daily.   Ensure Take 237 mLs by mouth 3 (three) times daily between meals.   escitalopram 5 MG tablet Commonly known as: LEXAPRO Take 5 mg by mouth daily.   isosorbide mononitrate 30 MG 24 hr tablet Commonly known as: IMDUR Take 0.5 tablets (15 mg total) by mouth daily.   loratadine 10 MG tablet Commonly known as: CLARITIN Take 1 tablet (10 mg total) by mouth daily as needed (seasonal allergies).   metoprolol tartrate 25 MG tablet Commonly known as: LOPRESSOR Take 1.5 tablets (37.5 mg total) by mouth 2 (two) times daily.   multivitamin with minerals Tabs tablet Take 1 tablet by mouth daily.  NON FORMULARY Diet order: Regular liberalized diet d/t poor intake.   OYSTER SHELL/VITAMIN D PO Take 1 tablet by mouth daily. With breakfast   polyethylene glycol 17 g packet Commonly known as: MIRALAX / GLYCOLAX Take 17 g by mouth daily as needed.   promethazine 12.5 MG tablet Commonly known as: PHENERGAN Take 1 tablet (12.5 mg total) by mouth 3 (three) times daily as needed for nausea or vomiting.   QUEtiapine 25 MG tablet Commonly known as: SEROQUEL Take 2 tablets (50 mg total) by mouth at bedtime.   tamsulosin 0.4 MG Caps capsule Commonly known as: FLOMAX Take 1 capsule (0.4 mg total) by mouth daily.   verapamil 120 MG 24 hr capsule Commonly known as: VERELAN PM Take 120  mg by mouth at bedtime.   warfarin 2.5 MG tablet Commonly known as: COUMADIN Take as directed by the anticoagulation clinic. If you are unsure how to take this medication, talk to your nurse or doctor. Original instructions: Take 2.5 mg by mouth every Monday, Wednesday, Friday, and Saturday at 6 PM.   warfarin 2 MG tablet Commonly known as: COUMADIN Take as directed by the anticoagulation clinic. If you are unsure how to take this medication, talk to your nurse or doctor. Original instructions: Take 2 mg by mouth See admin instructions. Take daily except M-W-F-S       No orders of the defined types were placed in this encounter.   Immunization History  Administered Date(s) Administered  . Fluad Quad(high Dose 65+) 03/07/2019  . Influenza Split 03/30/2012, 03/07/2019  . Influenza Whole 04/30/2006, 04/20/2008, 04/15/2009  . Influenza, High Dose Seasonal PF 04/20/2013, 03/27/2015, 03/31/2016, 04/22/2017, 04/20/2018  . Influenza,inj,Quad PF,6+ Mos 05/23/2014  . Moderna SARS-COVID-2 Vaccination 07/06/2019, 08/03/2019  . Pneumococcal Conjugate-13 04/24/2015  . Pneumococcal Polysaccharide-23 07/06/2009  . Td 10/30/2016    Social History   Tobacco Use  . Smoking status: Passive Smoke Exposure - Never Smoker  . Smokeless tobacco: Never Used  . Tobacco comment: husband smoked in home.   Substance Use Topics  . Alcohol use: No    Alcohol/week: 0.0 standard drinks     Vitals:   09/10/19 1934  BP: 101/75  Pulse: (!) 51  Resp: 18  Temp: 98.1 F (36.7 C)   There is no height or weight on file to calculate BMI.   Patient Active Problem List   Diagnosis Date Noted  . Altered behavior 05/10/2019  . Klebsiella cystitis 04/22/2019  . Metabolic encephalopathy [id number]  . Back pain 04/08/2019  . MCI (mild cognitive impairment) 03/07/2019  . Congestive heart failure (CHF) ([place]) 01/20/2019  . CAD (coronary artery disease) 06/07/2018  . Pulmonary hypertension, primary ([place])  06/07/2018  . Generalized weakness   . Abnormal LFTs 06/01/2018  . Hallucination 06/01/2018  . Abnormal chest x-ray 09/28/2015  . Edema of right lower extremity 09/28/2015  . Elevated brain natriuretic peptide (BNP) level 09/17/2015  . Seizures ([place]) 09/16/2015  . Asthma 08/14/2015  . PCP NOTES >>>>>>>>>>>>>>>>>>>>>>>>>>>>>> 04/24/2015  . TIA (transient ischemic attack) 12/11/2014  . Dyslipidemia 12/11/2014  . History of CVA (cerebrovascular accident) 12/01/2014  . Thrombocytopenia ([place]) 12/01/2014  . Chronic renal insufficiency, stage II (mild) 11/12/2013  . Impingement syndrome, shoulder, left 11/09/2013  . Acquired short bowel syndrome 11/09/2013  . Annual physical exam 08/30/2013  . Hx pulmonary embolism 06/27/2013  . Warfarin anticoagulation 06/27/2013  . DDD (degenerative disc disease), lumbar 11/18/2012  . ATRIAL FIBRILLATION   07/08/2010  . COLON CANCER, HX OF 07/08/2010  .  DIZZINESS 10/18/2009  . Essential hypertension 12/16/2007  . HIP PAIN, CHRONIC 12/28/2006  . ANEMIA-NOS 07/15/2006  . Personal history of venous thrombosis and embolism 07/15/2006    CMP     Component Value Date/Time   NA 138 08/11/2019 0000   NA 142 06/09/2012 1509   K 5.1 08/11/2019 0000   K 3.9 06/09/2012 1509   CL 108 08/11/2019 0000   CL 108 (H) 06/09/2012 1509   CO2 21 08/11/2019 0000   CO2 26 06/09/2012 1509   GLUCOSE 103 (H) 04/14/2019 0532   GLUCOSE 103 (H) 06/09/2012 1509   BUN 35 (A) 08/11/2019 0000   BUN 17.0 06/09/2012 1509   CREATININE 1.2 (A) 08/11/2019 0000   CREATININE 1.13 (H) 04/14/2019 0532   CREATININE 1.51 (H) 07/22/2018 1624   CREATININE 1.2 (H) 06/09/2012 1509   CALCIUM 8.9 08/11/2019 0000   CALCIUM 9.5 06/09/2012 1509   PROT 6.9 04/07/2019 1530   PROT 6.9 01/13/2018 1620   PROT 7.2 06/09/2012 1509   ALBUMIN 4.7 04/07/2019 1530   ALBUMIN 4.6 01/13/2018 1620   ALBUMIN 4.2 06/09/2012 1509   AST 31 04/07/2019 1530   AST 17 06/09/2012 1509   ALT 15 04/07/2019  1530   ALT 11 06/09/2012 1509   ALKPHOS 56 04/07/2019 1530   ALKPHOS 47 06/09/2012 1509   BILITOT 1.4 (H) 04/07/2019 1530   BILITOT 1.1 01/13/2018 1620   BILITOT 0.73 06/09/2012 1509   GFRNONAA 41.35 08/11/2019 0000   GFRAA 47.92 08/11/2019 0000   Recent Labs    01/22/19 0405 01/22/19 0405 01/24/19 0432 03/07/19 1454 04/08/19 0021 04/09/19 0456 04/11/19 0556 04/11/19 0556 04/12/19 0536 04/12/19 0536 04/14/19 0532 05/18/19 0000 07/14/19 0000 07/24/19 0000 08/11/19 0000  NA 142   < > 144   < > 144   < > 148*   < > 147*   < > 145   < > 146 146 138  K 3.2*   < > 4.7   < > 3.5   < > 4.3   < > 4.1   < > 3.8   < > 4.7 4.4 5.1  CL 105   < > 112*   < > 107   < > 111   < > 108   < > 109   < > 113* 112* 108  CO2 27   < > 24   < > 27   < > 27   < > 25   < > 24   < > 18 23* 21  GLUCOSE 92   < > 107*   < > 114*   < > 87  --  93  --  103*  --   --   --   --   BUN 24*   < > 25*   < > 41*   < > 31*   < > 29*   < > 26*   < > 19 15 35*  CREATININE 1.41*   < > 1.33*   < > 1.96*   < > 1.28*   < > 1.26*   < > 1.13*   < > 0.9 0.8 1.2*  CALCIUM 8.5*   < > 9.4   < > 9.2   < > 9.0   < > 9.3   < > 9.4   < > 9.7 9.3 8.9  MG 2.1  --  2.0  --  2.2  --   --   --   --   --   --   --   --   --   --  PHOS 3.4  --   --   --  4.0  --   --   --   --   --   --   --   --   --   --    < > = values in this interval not displayed.   Recent Labs    01/20/19 0918 03/07/19 1454 04/07/19 1530  AST 25 17 31   ALT 15 10 15   ALKPHOS 41 43 56  BILITOT 0.7 0.8 1.4*  PROT 5.9* 6.6 6.9  ALBUMIN 3.8 4.1 4.7   Recent Labs    04/07/19 1530 04/07/19 1530 04/08/19 0021 04/08/19 0021 04/12/19 0536 04/12/19 0536 05/18/19 0000 07/14/19 0000 08/11/19 0000  WBC 8.2   < > 6.9   < > 5.3  --  4.6 3.6 5.2  NEUTROABS 6.5  --   --   --   --   --  3 2 4   HGB 13.3   < > 12.7   < > 12.9   < > 12.2 12.4 12.4  HCT 41.2   < > 40.0   < > 42.0   < > 36 37 37  MCV 95.2  --  97.6  --  100.0  --   --   --   --   PLT 75*   < >  76*   < > 70*   < > 84* 60* 69*   < > = values in this interval not displayed.   No results for input(s): CHOL, LDLCALC, TRIG in the last 8760 hours.  Invalid input(s): HCL No results found for: [facility] Lab Results  Component Value Date   TSH 3.58 08/22/2018   Lab Results  Component Value Date   HGBA1C 5.6 11/13/2013   Lab Results  Component Value Date   CHOL 97 08/16/2017   HDL 37.70 (L) 08/16/2017   LDLCALC 48 08/16/2017   TRIG 56.0 08/16/2017   CHOLHDL 3 08/16/2017    Significant Diagnostic Results in last 30 days:  No results found.  Assessment and Plan  Atrial fibrillation/history of PE/anticoagulated on Coumadin -Friday is 1.8 on Coumadin 2.5 mg Monday Wednesday Friday and 2 mg on Tuesday Thursday Saturday and Sunday.  We will need to increase to Coumadin 2.5 mg on Monday Wednesday Friday and Saturday and 2 mg on Tuesday Thursday and Sunday.  Recheck INR 1 week     [name] , MD

## 2019-09-11 ENCOUNTER — Encounter: Payer: Self-pay | Admitting: Internal Medicine

## 2019-09-11 ENCOUNTER — Non-Acute Institutional Stay (SKILLED_NURSING_FACILITY): Payer: Medicare Other | Admitting: Internal Medicine

## 2019-09-11 ENCOUNTER — Telehealth: Payer: Self-pay | Admitting: Nurse Practitioner

## 2019-09-11 DIAGNOSIS — Z8673 Personal history of transient ischemic attack (TIA), and cerebral infarction without residual deficits: Secondary | ICD-10-CM | POA: Diagnosis not present

## 2019-09-11 DIAGNOSIS — I5030 Unspecified diastolic (congestive) heart failure: Secondary | ICD-10-CM | POA: Diagnosis not present

## 2019-09-11 DIAGNOSIS — R443 Hallucinations, unspecified: Secondary | ICD-10-CM

## 2019-09-11 DIAGNOSIS — I25118 Atherosclerotic heart disease of native coronary artery with other forms of angina pectoris: Secondary | ICD-10-CM | POA: Diagnosis not present

## 2019-09-11 DIAGNOSIS — Z710 Person encountering health services to consult on behalf of another person: Secondary | ICD-10-CM | POA: Diagnosis not present

## 2019-09-11 DIAGNOSIS — D649 Anemia, unspecified: Secondary | ICD-10-CM | POA: Diagnosis not present

## 2019-09-11 DIAGNOSIS — I10 Essential (primary) hypertension: Secondary | ICD-10-CM | POA: Diagnosis not present

## 2019-09-11 LAB — BASIC METABOLIC PANEL
BUN: 19 (ref 4–21)
CO2: 23 — AB (ref 13–22)
Chloride: 112 — AB (ref 99–108)
Creatinine: 1 (ref 0.5–1.1)
Glucose: 83
Potassium: 4 (ref 3.4–5.3)
Sodium: 147 (ref 137–147)

## 2019-09-11 LAB — COMPREHENSIVE METABOLIC PANEL
Calcium: 9.1 (ref 8.7–10.7)
GFR calc Af Amer: 59.45
GFR calc non Af Amer: 51.3

## 2019-09-11 NOTE — Telephone Encounter (Signed)
Called and reported Na 147, K 4.0, Bun 18, creat 0.97, the patient is already on encouraging oral fluid intake every 2 hours  recommended to call Dr. [name] in the morning.

## 2019-09-11 NOTE — Progress Notes (Signed)
Location:  [facility] Room Number: 212-D Place of Service:  SNF (31)  [name], MD  Patient Care Team: [name], MD as PCP - General (Internal Medicine)  Extended Emergency Contact Information Primary Emergency Contact: [facility] Address: [address]          [place], [place] [address] [name] of [place] Phone: [phone] Mobile Phone: [phone] Relation: Daughter    Allergies: Patient has no known allergies.  Chief Complaint  Patient presents with  . Acute Visit    Anticoagulation    HPI: Patient is 86 y.o. female who is being seen for care plan meeting.  In attendance are her daughter Rush Landmark [place], [place] work, [employer], and myself.  Past Medical History:  Diagnosis Date  . Acute encephalopathy   . Acute renal failure superimposed on stage 3 chronic kidney disease ([place])   . Anemia   . Atrial fibrillation ([place])   . Colon cancer ([place]) 12/11  . Colon cancer ([place]) 2011  . Diarrhea   . DVT (deep venous thrombosis) ([place]) 11/06  . Elevated brain natriuretic peptide (BNP) level   . Frequent headaches   . Heart murmur   . Hyperkalemia   . Hypertension   . Pulmonary embolism ([place]) 11/06  . Seizures ([place])   . Stroke ([place]) 11/30/2014  . Urine retention 01/2019    Past Surgical History:  Procedure Laterality Date  . APPENDECTOMY  1952  . COLECTOMY  06/2010   partial, Dr. Donne Hazel complicated by LLE CVT; S/P IVC umbrella & anemia  . HIP FRACTURE SURGERY  2006   Trauma  . TOTAL ABDOMINAL HYSTERECTOMY W/ BILATERAL SALPINGOOPHORECTOMY  1973   For Fibroids  . TOTAL HIP ARTHROPLASTY  01/03/07   Left hip replacement.  Marland Kitchen VENA CAVA FILTER PLACEMENT  06/28/2010    Allergies as of 09/11/2019   No Known Allergies     Medication List       Accurate as of September 11, 2019 11:59 PM. If you have any questions, ask your nurse or doctor.        acetaminophen 500 MG tablet Commonly  known as: TYLENOL Take 1,000 mg by mouth every 8 (eight) hours as needed for headache (pain).   albuterol (2.5 MG/3ML) 0.083% nebulizer solution Commonly known as: PROVENTIL Take 2.5 mg by nebulization every 6 (six) hours as needed for wheezing or shortness of breath.   albuterol 108 (90 Base) MCG/ACT inhaler Commonly known as: VENTOLIN HFA Inhale 2 puffs into the lungs every 6 (six) hours as needed for wheezing or shortness of breath.   aspirin 81 MG chewable tablet Chew 81 mg by mouth daily.   atorvastatin 20 MG tablet Commonly known as: LIPITOR Take 1 tablet (20 mg total) by mouth daily.   Dulera 100-5 MCG/ACT Aero Generic drug: mometasone-formoterol Inhale 2 puffs into the lungs 2 (two) times daily.   Ensure Take 237 mLs by mouth 3 (three) times daily between meals.   escitalopram 5 MG tablet Commonly known as: LEXAPRO Take 5 mg by mouth daily.   isosorbide mononitrate 30 MG 24 hr tablet Commonly known as: IMDUR Take 0.5 tablets (15 mg total) by mouth daily.   loratadine 10 MG tablet Commonly known as: CLARITIN Take 1 tablet (10 mg total) by mouth daily as needed (seasonal allergies).   metoprolol tartrate 25 MG tablet Commonly known as: LOPRESSOR Take 1.5 tablets (37.5 mg total) by mouth 2 (two) times daily.  multivitamin with minerals Tabs tablet Take 1 tablet by mouth daily.   NON FORMULARY Diet order: Regular liberalized diet d/t poor intake.   OYSTER SHELL/VITAMIN D PO Take 1 tablet by mouth daily. With breakfast   polyethylene glycol 17 g packet Commonly known as: MIRALAX / GLYCOLAX Take 17 g by mouth daily as needed.   promethazine 12.5 MG tablet Commonly known as: PHENERGAN Take 1 tablet (12.5 mg total) by mouth 3 (three) times daily as needed for nausea or vomiting.   QUEtiapine 25 MG tablet Commonly known as: SEROQUEL Take 2 tablets (50 mg total) by mouth at bedtime.   tamsulosin 0.4 MG Caps capsule Commonly known as: FLOMAX Take 1  capsule (0.4 mg total) by mouth daily.   verapamil 120 MG 24 hr capsule Commonly known as: VERELAN PM Take 120 mg by mouth at bedtime.   warfarin 2.5 MG tablet Commonly known as: COUMADIN Take as directed by the anticoagulation clinic. If you are unsure how to take this medication, talk to your nurse or doctor. Original instructions: Take 2.5 mg by mouth every Monday, Wednesday, Friday, and Saturday at 6 PM.   warfarin 2 MG tablet Commonly known as: COUMADIN Take as directed by the anticoagulation clinic. If you are unsure how to take this medication, talk to your nurse or doctor. Original instructions: Take 2 mg by mouth See admin instructions. Take daily except M-W-F-S       No orders of the defined types were placed in this encounter.   Immunization History  Administered Date(s) Administered  . Fluad Quad(high Dose 65+) 03/07/2019  . Influenza Split 03/30/2012, 03/07/2019  . Influenza Whole 04/30/2006, 04/20/2008, 04/15/2009  . Influenza, High Dose Seasonal PF 04/20/2013, 03/27/2015, 03/31/2016, 04/22/2017, 04/20/2018  . Influenza,inj,Quad PF,6+ Mos 05/23/2014  . Moderna SARS-COVID-2 Vaccination 07/06/2019, 08/03/2019  . Pneumococcal Conjugate-13 04/24/2015  . Pneumococcal Polysaccharide-23 07/06/2009  . Td 10/30/2016    Social History   Tobacco Use  . Smoking status: Passive Smoke Exposure - Never Smoker  . Smokeless tobacco: Never Used  . Tobacco comment: husband smoked in home.   Substance Use Topics  . Alcohol use: No    Alcohol/week: 0.0 standard drinks    Review of Systems Patient can contribute very little to history GENERAL:  no fevers, fatigue, appetite changes SKIN: No itching, rash HEENT: No complaint RESPIRATORY: No cough, wheezing, SOB CARDIAC: No chest pain, palpitations, lower extremity edema  GI: No abdominal pain, No N/V/D or constipation, No heartburn or reflux  GU: No dysuria, frequency or urgency, or incontinence  MUSCULOSKELETAL: No  unrelieved bone/joint pain NEUROLOGIC: No headache, dizziness  PSYCHIATRIC: No overt anxiety or sadness  Vitals:   09/11/19 1551  BP: 118/64  Pulse: 68  Resp: 17  Temp: (!) 97.4 F (36.3 C)   Body mass index is 27.65 kg/m. Physical Exam  GENERAL APPEARANCE: Alert, conversant, No acute distress  SKIN: No diaphoresis rash HEENT: Unremarkable RESPIRATORY: Breathing is even, unlabored. Lung sounds are clear   CARDIOVASCULAR: Heart RRR no murmurs, rubs or gallops. No peripheral edema  GASTROINTESTINAL: Abdomen is soft, non-tender, not distended w/ normal bowel sounds.  GENITOURINARY: Bladder non tender, not distended  MUSCULOSKELETAL: No abnormal joints or musculature NEUROLOGIC: Cranial nerves 2-12 grossly intact. Moves all extremities PSYCHIATRIC: Mood and affect with confusion at times, psychosis at times makes sense at times with dementia, no behavioral issues  Patient Active Problem List   Diagnosis Date Noted  . Altered behavior 05/10/2019  . Klebsiella cystitis 04/22/2019  .  Metabolic encephalopathy [id number]  . Back pain 04/08/2019  . MCI (mild cognitive impairment) 03/07/2019  . Congestive heart failure (CHF) ([place]) 01/20/2019  . CAD (coronary artery disease) 06/07/2018  . Pulmonary hypertension, primary ([place]) 06/07/2018  . Generalized weakness   . Abnormal LFTs 06/01/2018  . Hallucination 06/01/2018  . Abnormal chest x-ray 09/28/2015  . Edema of right lower extremity 09/28/2015  . Elevated brain natriuretic peptide (BNP) level 09/17/2015  . Seizures ([place]) 09/16/2015  . Asthma 08/14/2015  . PCP NOTES >>>>>>>>>>>>>>>>>>>>>>>>>>>>>> 04/24/2015  . TIA (transient ischemic attack) 12/11/2014  . Dyslipidemia 12/11/2014  . History of CVA (cerebrovascular accident) 12/01/2014  . Thrombocytopenia ([place]) 12/01/2014  . Chronic renal insufficiency, stage II (mild) 11/12/2013  . Impingement syndrome, shoulder, left 11/09/2013  . Acquired short bowel syndrome 11/09/2013  .  Annual physical exam 08/30/2013  . Hx pulmonary embolism 06/27/2013  . Warfarin anticoagulation 06/27/2013  . DDD (degenerative disc disease), lumbar 11/18/2012  . ATRIAL FIBRILLATION   07/08/2010  . COLON CANCER, HX OF 07/08/2010  . DIZZINESS 10/18/2009  . Essential hypertension 12/16/2007  . HIP PAIN, CHRONIC 12/28/2006  . ANEMIA-NOS 07/15/2006  . Personal history of venous thrombosis and embolism 07/15/2006    CMP     Component Value Date/Time   NA 147 09/11/2019 0000   NA 142 06/09/2012 1509   K 4.0 09/11/2019 0000   K 3.9 06/09/2012 1509   CL 112 (A) 09/11/2019 0000   CL 108 (H) 06/09/2012 1509   CO2 23 (A) 09/11/2019 0000   CO2 26 06/09/2012 1509   GLUCOSE 103 (H) 04/14/2019 0532   GLUCOSE 103 (H) 06/09/2012 1509   BUN 19 09/11/2019 0000   BUN 17.0 06/09/2012 1509   CREATININE 1.0 09/11/2019 0000   CREATININE 1.13 (H) 04/14/2019 0532   CREATININE 1.51 (H) 07/22/2018 1624   CREATININE 1.2 (H) 06/09/2012 1509   CALCIUM 9.1 09/11/2019 0000   CALCIUM 9.5 06/09/2012 1509   PROT 6.9 04/07/2019 1530   PROT 6.9 01/13/2018 1620   PROT 7.2 06/09/2012 1509   ALBUMIN 4.7 04/07/2019 1530   ALBUMIN 4.6 01/13/2018 1620   ALBUMIN 4.2 06/09/2012 1509   AST 31 04/07/2019 1530   AST 17 06/09/2012 1509   ALT 15 04/07/2019 1530   ALT 11 06/09/2012 1509   ALKPHOS 56 04/07/2019 1530   ALKPHOS 47 06/09/2012 1509   BILITOT 1.4 (H) 04/07/2019 1530   BILITOT 1.1 01/13/2018 1620   BILITOT 0.73 06/09/2012 1509   GFRNONAA 51.30 09/11/2019 0000   GFRAA 59.45 09/11/2019 0000   Recent Labs    01/22/19 0405 01/22/19 0405 01/24/19 0432 03/07/19 1454 04/08/19 0021 04/09/19 0456 04/11/19 0556 04/11/19 0556 04/12/19 0536 04/12/19 0536 04/14/19 0532 05/18/19 0000 08/11/19 0000 09/07/19 0000 09/11/19 0000  NA 142   < > 144   < > 144   < > 148*   < > 147*   < > 145   < > 138 146 147  K 3.2*   < > 4.7   < > 3.5   < > 4.3   < > 4.1   < > 3.8   < > 5.1 4.5 4.0  CL 105   < > 112*   <  > 107   < > 111   < > 108   < > 109   < > 108 112* 112*  CO2 27   < > 24   < > 27   < > 27   < >  25   < > 24   < > 21 26* 23*  GLUCOSE 92   < > 107*   < > 114*   < > 87  --  93  --  103*  --   --   --   --   BUN 24*   < > 25*   < > 41*   < > 31*   < > 29*   < > 26*   < > 35* 27* 19  CREATININE 1.41*   < > 1.33*   < > 1.96*   < > 1.28*   < > 1.26*   < > 1.13*   < > 1.2* 1.0 1.0  CALCIUM 8.5*   < > 9.4   < > 9.2   < > 9.0   < > 9.3   < > 9.4   < > 8.9 9.0 9.1  MG 2.1  --  2.0  --  2.2  --   --   --   --   --   --   --   --   --   --   PHOS 3.4  --   --   --  4.0  --   --   --   --   --   --   --   --   --   --    < > = values in this interval not displayed.   Recent Labs    01/20/19 0918 03/07/19 1454 04/07/19 1530  AST 25 17 31   ALT 15 10 15   ALKPHOS 41 43 56  BILITOT 0.7 0.8 1.4*  PROT 5.9* 6.6 6.9  ALBUMIN 3.8 4.1 4.7   Recent Labs    04/07/19 1530 04/07/19 1530 04/08/19 0021 04/08/19 0021 04/12/19 0536 05/18/19 0000 07/14/19 0000 08/11/19 0000 09/07/19 0000  WBC 8.2   < > 6.9   < > 5.3   < > 3.6 5.2 3.4  NEUTROABS 6.5  --   --   --   --    < > 2 4 2   HGB 13.3   < > 12.7   < > 12.9   < > 12.4 12.4 11.9*  HCT 41.2   < > 40.0   < > 42.0   < > 37 37 36  MCV 95.2  --  97.6  --  100.0  --   --   --   --   PLT 75*   < > 76*   < > 70*   < > 60* 69* 63*   < > = values in this interval not displayed.   No results for input(s): CHOL, LDLCALC, TRIG in the last 8760 hours.  Invalid input(s): HCL No results found for: [facility] Lab Results  Component Value Date   TSH 3.58 08/22/2018   Lab Results  Component Value Date   HGBA1C 5.3 09/07/2019   Lab Results  Component Value Date   CHOL 97 08/16/2017   HDL 37.70 (L) 08/16/2017   LDLCALC 48 08/16/2017   TRIG 56.0 08/16/2017   CHOLHDL 3 08/16/2017    Significant Diagnostic Results in last 30 days:  No results found.  Assessment and Plan  Coronary disease, congestive heart failure, history of hallucinations,History  of CVA-patient is in her own little world she is pleasant she is happy; she is happy with her roommate, her weight is stable at 154 256 pounds sodium is a little bit elevated so she  is being given water every 2 hours to increase her water intake; patient still is DNR.  We will meet again in 3 months    [name], MD

## 2019-09-12 ENCOUNTER — Non-Acute Institutional Stay (SKILLED_NURSING_FACILITY): Payer: Medicare Other | Admitting: Internal Medicine

## 2019-09-12 ENCOUNTER — Encounter: Payer: Self-pay | Admitting: Internal Medicine

## 2019-09-12 DIAGNOSIS — I4811 Longstanding persistent atrial fibrillation: Secondary | ICD-10-CM

## 2019-09-12 DIAGNOSIS — Z7901 Long term (current) use of anticoagulants: Secondary | ICD-10-CM | POA: Diagnosis not present

## 2019-09-12 NOTE — Progress Notes (Signed)
Location:  [facility] Room Number: 212-D Place of Service:  SNF (31)  [name], MD  Patient Care Team: [name], MD as PCP - General (Internal Medicine)  Extended Emergency Contact Information Primary Emergency Contact: [facility] Address: [address]          [place], [place] [address] [name] of [place] Phone: [phone] Mobile Phone: [phone] Relation: Daughter    Allergies: Patient has no known allergies.  Chief Complaint  Patient presents with  . Anticoagulation    INR management    HPI: Patient is a 86 y.o. female who is being seen for INR check.  Patient is on Coumadin for atrial fibrillation.  Patient's INR today is 2.5 which is therapeutic  Past Medical History:  Diagnosis Date  . Acute encephalopathy   . Acute renal failure superimposed on stage 3 chronic kidney disease ([place])   . Anemia   . Atrial fibrillation ([place])   . Colon cancer ([place]) 12/11  . Colon cancer ([place]) 2011  . Diarrhea   . DVT (deep venous thrombosis) ([place]) 11/06  . Elevated brain natriuretic peptide (BNP) level   . Frequent headaches   . Heart murmur   . Hyperkalemia   . Hypertension   . Pulmonary embolism ([place]) 11/06  . Seizures ([place])   . Stroke ([place]) 11/30/2014  . Urine retention 01/2019    Past Surgical History:  Procedure Laterality Date  . APPENDECTOMY  1952  . COLECTOMY  06/2010   partial, Dr. Donne Hazel complicated by LLE CVT; S/P IVC umbrella & anemia  . HIP FRACTURE SURGERY  2006   Trauma  . TOTAL ABDOMINAL HYSTERECTOMY W/ BILATERAL SALPINGOOPHORECTOMY  1973   For Fibroids  . TOTAL HIP ARTHROPLASTY  01/03/07   Left hip replacement.  Marland Kitchen VENA CAVA FILTER PLACEMENT  06/28/2010    Allergies as of 09/12/2019   No Known Allergies     Medication List       Accurate as of September 12, 2019  4:18 PM. If you have any questions, ask your nurse or doctor.        acetaminophen 500 MG tablet Commonly known  as: TYLENOL Take 1,000 mg by mouth every 8 (eight) hours as needed for headache (pain).   albuterol (2.5 MG/3ML) 0.083% nebulizer solution Commonly known as: PROVENTIL Take 2.5 mg by nebulization every 6 (six) hours as needed for wheezing or shortness of breath.   albuterol 108 (90 Base) MCG/ACT inhaler Commonly known as: VENTOLIN HFA Inhale 2 puffs into the lungs every 6 (six) hours as needed for wheezing or shortness of breath.   aspirin 81 MG chewable tablet Chew 81 mg by mouth daily.   atorvastatin 20 MG tablet Commonly known as: LIPITOR Take 1 tablet (20 mg total) by mouth daily.   Dulera 100-5 MCG/ACT Aero Generic drug: mometasone-formoterol Inhale 2 puffs into the lungs 2 (two) times daily.   Ensure Take 237 mLs by mouth 3 (three) times daily between meals.   escitalopram 5 MG tablet Commonly known as: LEXAPRO Take 5 mg by mouth daily.   isosorbide mononitrate 30 MG 24 hr tablet Commonly known as: IMDUR Take 0.5 tablets (15 mg total) by mouth daily.   loratadine 10 MG tablet Commonly known as: CLARITIN Take 1 tablet (10 mg total) by mouth daily as needed (seasonal allergies).   metoprolol tartrate 25 MG tablet Commonly known as: LOPRESSOR Take 1.5 tablets (37.5 mg total) by mouth 2 (two) times daily.  multivitamin with minerals Tabs tablet Take 1 tablet by mouth daily.   NON FORMULARY Diet order: Regular liberalized diet d/t poor intake.   OYSTER SHELL/VITAMIN D PO Take 1 tablet by mouth daily. With breakfast   polyethylene glycol 17 g packet Commonly known as: MIRALAX / GLYCOLAX Take 17 g by mouth daily as needed.   promethazine 12.5 MG tablet Commonly known as: PHENERGAN Take 1 tablet (12.5 mg total) by mouth 3 (three) times daily as needed for nausea or vomiting.   QUEtiapine 25 MG tablet Commonly known as: SEROQUEL Take 2 tablets (50 mg total) by mouth at bedtime.   tamsulosin 0.4 MG Caps capsule Commonly known as: FLOMAX Take 1 capsule (0.4  mg total) by mouth daily.   verapamil 120 MG 24 hr capsule Commonly known as: VERELAN PM Take 120 mg by mouth at bedtime.   warfarin 2.5 MG tablet Commonly known as: COUMADIN Take as directed by the anticoagulation clinic. If you are unsure how to take this medication, talk to your nurse or doctor. Original instructions: Take 2.5 mg by mouth every Monday, Wednesday, Friday, and Saturday at 6 PM.   warfarin 2 MG tablet Commonly known as: COUMADIN Take as directed by the anticoagulation clinic. If you are unsure how to take this medication, talk to your nurse or doctor. Original instructions: Take 2 mg by mouth See admin instructions. Take daily except M-W-F-S       No orders of the defined types were placed in this encounter.   Immunization History  Administered Date(s) Administered  . Fluad Quad(high Dose 65+) 03/07/2019  . Influenza Split 03/30/2012, 03/07/2019  . Influenza Whole 04/30/2006, 04/20/2008, 04/15/2009  . Influenza, High Dose Seasonal PF 04/20/2013, 03/27/2015, 03/31/2016, 04/22/2017, 04/20/2018  . Influenza,inj,Quad PF,6+ Mos 05/23/2014  . Moderna SARS-COVID-2 Vaccination 07/06/2019, 08/03/2019  . Pneumococcal Conjugate-13 04/24/2015  . Pneumococcal Polysaccharide-23 07/06/2009  . Td 10/30/2016    Social History   Tobacco Use  . Smoking status: Passive Smoke Exposure - Never Smoker  . Smokeless tobacco: Never Used  . Tobacco comment: husband smoked in home.   Substance Use Topics  . Alcohol use: No    Alcohol/week: 0.0 standard drinks     Vitals:   09/12/19 1215  BP: 118/64  Pulse: 68  Resp: 17  Temp: (!) 97.4 F (36.3 C)   Body mass index is 27.65 kg/m.   Patient Active Problem List   Diagnosis Date Noted  . Altered behavior 05/10/2019  . Klebsiella cystitis 04/22/2019  . Metabolic encephalopathy [id number]  . Back pain 04/08/2019  . MCI (mild cognitive impairment) 03/07/2019  . Congestive heart failure (CHF) ([place]) 01/20/2019  . CAD  (coronary artery disease) 06/07/2018  . Pulmonary hypertension, primary ([place]) 06/07/2018  . Generalized weakness   . Abnormal LFTs 06/01/2018  . Hallucination 06/01/2018  . Abnormal chest x-ray 09/28/2015  . Edema of right lower extremity 09/28/2015  . Elevated brain natriuretic peptide (BNP) level 09/17/2015  . Seizures ([place]) 09/16/2015  . Asthma 08/14/2015  . PCP NOTES >>>>>>>>>>>>>>>>>>>>>>>>>>>>>> 04/24/2015  . TIA (transient ischemic attack) 12/11/2014  . Dyslipidemia 12/11/2014  . History of CVA (cerebrovascular accident) 12/01/2014  . Thrombocytopenia ([place]) 12/01/2014  . Chronic renal insufficiency, stage II (mild) 11/12/2013  . Impingement syndrome, shoulder, left 11/09/2013  . Acquired short bowel syndrome 11/09/2013  . Annual physical exam 08/30/2013  . Hx pulmonary embolism 06/27/2013  . Warfarin anticoagulation 06/27/2013  . DDD (degenerative disc disease), lumbar 11/18/2012  . ATRIAL FIBRILLATION   07/08/2010  .  COLON CANCER, HX OF 07/08/2010  . DIZZINESS 10/18/2009  . Essential hypertension 12/16/2007  . HIP PAIN, CHRONIC 12/28/2006  . ANEMIA-NOS 07/15/2006  . Personal history of venous thrombosis and embolism 07/15/2006    CMP     Component Value Date/Time   NA 147 09/11/2019 0000   NA 142 06/09/2012 1509   K 4.0 09/11/2019 0000   K 3.9 06/09/2012 1509   CL 112 (A) 09/11/2019 0000   CL 108 (H) 06/09/2012 1509   CO2 23 (A) 09/11/2019 0000   CO2 26 06/09/2012 1509   GLUCOSE 103 (H) 04/14/2019 0532   GLUCOSE 103 (H) 06/09/2012 1509   BUN 19 09/11/2019 0000   BUN 17.0 06/09/2012 1509   CREATININE 1.0 09/11/2019 0000   CREATININE 1.13 (H) 04/14/2019 0532   CREATININE 1.51 (H) 07/22/2018 1624   CREATININE 1.2 (H) 06/09/2012 1509   CALCIUM 9.1 09/11/2019 0000   CALCIUM 9.5 06/09/2012 1509   PROT 6.9 04/07/2019 1530   PROT 6.9 01/13/2018 1620   PROT 7.2 06/09/2012 1509   ALBUMIN 4.7 04/07/2019 1530   ALBUMIN 4.6 01/13/2018 1620   ALBUMIN 4.2 06/09/2012  1509   AST 31 04/07/2019 1530   AST 17 06/09/2012 1509   ALT 15 04/07/2019 1530   ALT 11 06/09/2012 1509   ALKPHOS 56 04/07/2019 1530   ALKPHOS 47 06/09/2012 1509   BILITOT 1.4 (H) 04/07/2019 1530   BILITOT 1.1 01/13/2018 1620   BILITOT 0.73 06/09/2012 1509   GFRNONAA 51.30 09/11/2019 0000   GFRAA 59.45 09/11/2019 0000   Recent Labs    01/22/19 0405 01/22/19 0405 01/24/19 0432 03/07/19 1454 04/08/19 0021 04/09/19 0456 04/11/19 0556 04/11/19 0556 04/12/19 0536 04/12/19 0536 04/14/19 0532 05/18/19 0000 08/11/19 0000 09/07/19 0000 09/11/19 0000  NA 142   < > 144   < > 144   < > 148*   < > 147*   < > 145   < > 138 146 147  K 3.2*   < > 4.7   < > 3.5   < > 4.3   < > 4.1   < > 3.8   < > 5.1 4.5 4.0  CL 105   < > 112*   < > 107   < > 111   < > 108   < > 109   < > 108 112* 112*  CO2 27   < > 24   < > 27   < > 27   < > 25   < > 24   < > 21 26* 23*  GLUCOSE 92   < > 107*   < > 114*   < > 87  --  93  --  103*  --   --   --   --   BUN 24*   < > 25*   < > 41*   < > 31*   < > 29*   < > 26*   < > 35* 27* 19  CREATININE 1.41*   < > 1.33*   < > 1.96*   < > 1.28*   < > 1.26*   < > 1.13*   < > 1.2* 1.0 1.0  CALCIUM 8.5*   < > 9.4   < > 9.2   < > 9.0   < > 9.3   < > 9.4   < > 8.9 9.0 9.1  MG 2.1  --  2.0  --  2.2  --   --   --   --   --   --   --   --   --   --  PHOS 3.4  --   --   --  4.0  --   --   --   --   --   --   --   --   --   --    < > = values in this interval not displayed.   Recent Labs    01/20/19 0918 03/07/19 1454 04/07/19 1530  AST 25 17 31   ALT 15 10 15   ALKPHOS 41 43 56  BILITOT 0.7 0.8 1.4*  PROT 5.9* 6.6 6.9  ALBUMIN 3.8 4.1 4.7   Recent Labs    04/07/19 1530 04/07/19 1530 04/08/19 0021 04/08/19 0021 04/12/19 0536 05/18/19 0000 07/14/19 0000 08/11/19 0000 09/07/19 0000  WBC 8.2   < > 6.9   < > 5.3   < > 3.6 5.2 3.4  NEUTROABS 6.5  --   --   --   --    < > 2 4 2   HGB 13.3   < > 12.7   < > 12.9   < > 12.4 12.4 11.9*  HCT 41.2   < > 40.0   < > 42.0    < > 37 37 36  MCV 95.2  --  97.6  --  100.0  --   --   --   --   PLT 75*   < > 76*   < > 70*   < > 60* 69* 63*   < > = values in this interval not displayed.   No results for input(s): CHOL, LDLCALC, TRIG in the last 8760 hours.  Invalid input(s): HCL No results found for: [facility] Lab Results  Component Value Date   TSH 3.58 08/22/2018   Lab Results  Component Value Date   HGBA1C 5.3 09/07/2019   Lab Results  Component Value Date   CHOL 97 08/16/2017   HDL 37.70 (L) 08/16/2017   LDLCALC 48 08/16/2017   TRIG 56.0 08/16/2017   CHOLHDL 3 08/16/2017    Significant Diagnostic Results in last 30 days:  No results found.  Assessment and Plan  Atrial fibrillation/anticoagulated on Coumadin-patient's INR today is 2.5 on Coumadin 2.5 mg Monday Wednesday Friday Saturday and 2 mg on Tuesday Thursday and Sunday; continue with current regimen and repeat in 1 week  No problem-specific Assessment & Plan notes found for this encounter.   Labs/tests ordered:    [name] , MD

## 2019-09-12 NOTE — Progress Notes (Deleted)
Location:  [facility] Room Number: 212-D Place of Service:  SNF (31)  [name], MD  Patient Care Team: [name], MD as PCP - General (Internal Medicine)  Extended Emergency Contact Information Primary Emergency Contact: [facility] Address: [address]          [place], [place] [address] [name] of [place] Phone: [phone] Mobile Phone: [phone] Relation: Daughter    Allergies: Patient has no known allergies.  Chief Complaint  Patient presents with  . Anticoagulation    INR management    HPI: Patient is 86 y.o. female who   Past Medical History:  Diagnosis Date  . Acute encephalopathy   . Acute renal failure superimposed on stage 3 chronic kidney disease ([place])   . Anemia   . Atrial fibrillation ([place])   . Colon cancer ([place]) 12/11  . Colon cancer ([place]) 2011  . Diarrhea   . DVT (deep venous thrombosis) ([place]) 11/06  . Elevated brain natriuretic peptide (BNP) level   . Frequent headaches   . Heart murmur   . Hyperkalemia   . Hypertension   . Pulmonary embolism ([place]) 11/06  . Seizures ([place])   . Stroke ([place]) 11/30/2014  . Urine retention 01/2019    Past Surgical History:  Procedure Laterality Date  . APPENDECTOMY  1952  . COLECTOMY  06/2010   partial, Dr. Donne Hazel complicated by LLE CVT; S/P IVC umbrella & anemia  . HIP FRACTURE SURGERY  2006   Trauma  . TOTAL ABDOMINAL HYSTERECTOMY W/ BILATERAL SALPINGOOPHORECTOMY  1973   For Fibroids  . TOTAL HIP ARTHROPLASTY  01/03/07   Left hip replacement.  Marland Kitchen VENA CAVA FILTER PLACEMENT  06/28/2010    Allergies as of 09/12/2019   No Known Allergies     Medication List       Accurate as of September 12, 2019 12:17 PM. If you have any questions, ask your nurse or doctor.        acetaminophen 500 MG tablet Commonly known as: TYLENOL Take 1,000 mg by mouth every 8 (eight) hours as needed for headache (pain).   albuterol (2.5 MG/3ML) 0.083%  nebulizer solution Commonly known as: PROVENTIL Take 2.5 mg by nebulization every 6 (six) hours as needed for wheezing or shortness of breath.   albuterol 108 (90 Base) MCG/ACT inhaler Commonly known as: VENTOLIN HFA Inhale 2 puffs into the lungs every 6 (six) hours as needed for wheezing or shortness of breath.   aspirin 81 MG chewable tablet Chew 81 mg by mouth daily.   atorvastatin 20 MG tablet Commonly known as: LIPITOR Take 1 tablet (20 mg total) by mouth daily.   Dulera 100-5 MCG/ACT Aero Generic drug: mometasone-formoterol Inhale 2 puffs into the lungs 2 (two) times daily.   Ensure Take 237 mLs by mouth 3 (three) times daily between meals.   escitalopram 5 MG tablet Commonly known as: LEXAPRO Take 5 mg by mouth daily.   isosorbide mononitrate 30 MG 24 hr tablet Commonly known as: IMDUR Take 0.5 tablets (15 mg total) by mouth daily.   loratadine 10 MG tablet Commonly known as: CLARITIN Take 1 tablet (10 mg total) by mouth daily as needed (seasonal allergies).   metoprolol tartrate 25 MG tablet Commonly known as: LOPRESSOR Take 1.5 tablets (37.5 mg total) by mouth 2 (two) times daily.   multivitamin with minerals Tabs tablet Take 1 tablet by mouth daily.   NON FORMULARY Diet order: Regular liberalized diet d/t poor  intake.   OYSTER SHELL/VITAMIN D PO Take 1 tablet by mouth daily. With breakfast   polyethylene glycol 17 g packet Commonly known as: MIRALAX / GLYCOLAX Take 17 g by mouth daily as needed.   promethazine 12.5 MG tablet Commonly known as: PHENERGAN Take 1 tablet (12.5 mg total) by mouth 3 (three) times daily as needed for nausea or vomiting.   QUEtiapine 25 MG tablet Commonly known as: SEROQUEL Take 2 tablets (50 mg total) by mouth at bedtime.   tamsulosin 0.4 MG Caps capsule Commonly known as: FLOMAX Take 1 capsule (0.4 mg total) by mouth daily.   verapamil 120 MG 24 hr capsule Commonly known as: VERELAN PM Take 120 mg by mouth at  bedtime.   warfarin 2.5 MG tablet Commonly known as: COUMADIN Take as directed by the anticoagulation clinic. If you are unsure how to take this medication, talk to your nurse or doctor. Original instructions: Take 2.5 mg by mouth every Monday, Wednesday, Friday, and Saturday at 6 PM.   warfarin 2 MG tablet Commonly known as: COUMADIN Take as directed by the anticoagulation clinic. If you are unsure how to take this medication, talk to your nurse or doctor. Original instructions: Take 2 mg by mouth See admin instructions. Take daily except M-W-F-S       No orders of the defined types were placed in this encounter.   Immunization History  Administered Date(s) Administered  . Fluad Quad(high Dose 65+) 03/07/2019  . Influenza Split 03/30/2012, 03/07/2019  . Influenza Whole 04/30/2006, 04/20/2008, 04/15/2009  . Influenza, High Dose Seasonal PF 04/20/2013, 03/27/2015, 03/31/2016, 04/22/2017, 04/20/2018  . Influenza,inj,Quad PF,6+ Mos 05/23/2014  . Moderna SARS-COVID-2 Vaccination 07/06/2019, 08/03/2019  . Pneumococcal Conjugate-13 04/24/2015  . Pneumococcal Polysaccharide-23 07/06/2009  . Td 10/30/2016    Social History   Tobacco Use  . Smoking status: Passive Smoke Exposure - Never Smoker  . Smokeless tobacco: Never Used  . Tobacco comment: husband smoked in home.   Substance Use Topics  . Alcohol use: No    Alcohol/week: 0.0 standard drinks    Review of Systems  DATA OBTAINED: from patient, nurse, medical record, family member GENERAL:  no fevers, fatigue, appetite changes SKIN: No itching, rash HEENT: No complaint RESPIRATORY: No cough, wheezing, SOB CARDIAC: No chest pain, palpitations, lower extremity edema  GI: No abdominal pain, No N/V/D or constipation, No heartburn or reflux  GU: No dysuria, frequency or urgency, or incontinence  MUSCULOSKELETAL: No unrelieved bone/joint pain NEUROLOGIC: No headache, dizziness  PSYCHIATRIC: No overt anxiety or  sadness  Vitals:   09/12/19 1215  BP: 118/64  Pulse: 68  Resp: 17  Temp: (!) 97.4 F (36.3 C)   Body mass index is 27.65 kg/m. Physical Exam  GENERAL APPEARANCE: Alert, conversant, No acute distress  SKIN: No diaphoresis rash HEENT: Unremarkable RESPIRATORY: Breathing is even, unlabored. Lung sounds are clear   CARDIOVASCULAR: Heart RRR no murmurs, rubs or gallops. No peripheral edema  GASTROINTESTINAL: Abdomen is soft, non-tender, not distended w/ normal bowel sounds.  GENITOURINARY: Bladder non tender, not distended  MUSCULOSKELETAL: No abnormal joints or musculature NEUROLOGIC: Cranial nerves 2-12 grossly intact. Moves all extremities PSYCHIATRIC: Mood and affect appropriate to situation, no behavioral issues  Patient Active Problem List   Diagnosis Date Noted  . Altered behavior 05/10/2019  . Klebsiella cystitis 04/22/2019  . Metabolic encephalopathy A999333  . Back pain 04/08/2019  . MCI (mild cognitive impairment) 03/07/2019  . Congestive heart failure (CHF) ([place]) 01/20/2019  . CAD (coronary artery  disease) 06/07/2018  . Pulmonary hypertension, primary ([place]) 06/07/2018  . Generalized weakness   . Abnormal LFTs 06/01/2018  . Hallucination 06/01/2018  . Abnormal chest x-ray 09/28/2015  . Edema of right lower extremity 09/28/2015  . Elevated brain natriuretic peptide (BNP) level 09/17/2015  . Seizures ([place]) 09/16/2015  . Asthma 08/14/2015  . PCP NOTES >>>>>>>>>>>>>>>>>>>>>>>>>>>>>> 04/24/2015  . TIA (transient ischemic attack) 12/11/2014  . Dyslipidemia 12/11/2014  . History of CVA (cerebrovascular accident) 12/01/2014  . Thrombocytopenia ([place]) 12/01/2014  . Chronic renal insufficiency, stage II (mild) 11/12/2013  . Impingement syndrome, shoulder, left 11/09/2013  . Acquired short bowel syndrome 11/09/2013  . Annual physical exam 08/30/2013  . Hx pulmonary embolism 06/27/2013  . Warfarin anticoagulation 06/27/2013  . DDD (degenerative disc disease), lumbar  11/18/2012  . ATRIAL FIBRILLATION   07/08/2010  . COLON CANCER, HX OF 07/08/2010  . DIZZINESS 10/18/2009  . Essential hypertension 12/16/2007  . HIP PAIN, CHRONIC 12/28/2006  . ANEMIA-NOS 07/15/2006  . Personal history of venous thrombosis and embolism 07/15/2006    CMP     Component Value Date/Time   NA 138 08/11/2019 0000   NA 142 06/09/2012 1509   K 5.1 08/11/2019 0000   K 3.9 06/09/2012 1509   CL 108 08/11/2019 0000   CL 108 (H) 06/09/2012 1509   CO2 21 08/11/2019 0000   CO2 26 06/09/2012 1509   GLUCOSE 103 (H) 04/14/2019 0532   GLUCOSE 103 (H) 06/09/2012 1509   BUN 35 (A) 08/11/2019 0000   BUN 17.0 06/09/2012 1509   CREATININE 1.2 (A) 08/11/2019 0000   CREATININE 1.13 (H) 04/14/2019 0532   CREATININE 1.51 (H) 07/22/2018 1624   CREATININE 1.2 (H) 06/09/2012 1509   CALCIUM 8.9 08/11/2019 0000   CALCIUM 9.5 06/09/2012 1509   PROT 6.9 04/07/2019 1530   PROT 6.9 01/13/2018 1620   PROT 7.2 06/09/2012 1509   ALBUMIN 4.7 04/07/2019 1530   ALBUMIN 4.6 01/13/2018 1620   ALBUMIN 4.2 06/09/2012 1509   AST 31 04/07/2019 1530   AST 17 06/09/2012 1509   ALT 15 04/07/2019 1530   ALT 11 06/09/2012 1509   ALKPHOS 56 04/07/2019 1530   ALKPHOS 47 06/09/2012 1509   BILITOT 1.4 (H) 04/07/2019 1530   BILITOT 1.1 01/13/2018 1620   BILITOT 0.73 06/09/2012 1509   GFRNONAA 41.35 08/11/2019 0000   GFRAA 47.92 08/11/2019 0000   Recent Labs    01/22/19 0405 01/22/19 0405 01/24/19 0432 03/07/19 1454 04/08/19 0021 04/09/19 0456 04/11/19 0556 04/11/19 0556 04/12/19 0536 04/12/19 0536 04/14/19 0532 05/18/19 0000 07/14/19 0000 07/24/19 0000 08/11/19 0000  NA 142   < > 144   < > 144   < > 148*   < > 147*   < > 145   < > 146 146 138  K 3.2*   < > 4.7   < > 3.5   < > 4.3   < > 4.1   < > 3.8   < > 4.7 4.4 5.1  CL 105   < > 112*   < > 107   < > 111   < > 108   < > 109   < > 113* 112* 108  CO2 27   < > 24   < > 27   < > 27   < > 25   < > 24   < > 18 23* 21  GLUCOSE 92   < > 107*   <  > 114*   < >  87  --  93  --  103*  --   --   --   --   BUN 24*   < > 25*   < > 41*   < > 31*   < > 29*   < > 26*   < > 19 15 35*  CREATININE 1.41*   < > 1.33*   < > 1.96*   < > 1.28*   < > 1.26*   < > 1.13*   < > 0.9 0.8 1.2*  CALCIUM 8.5*   < > 9.4   < > 9.2   < > 9.0   < > 9.3   < > 9.4   < > 9.7 9.3 8.9  MG 2.1  --  2.0  --  2.2  --   --   --   --   --   --   --   --   --   --   PHOS 3.4  --   --   --  4.0  --   --   --   --   --   --   --   --   --   --    < > = values in this interval not displayed.   Recent Labs    01/20/19 0918 03/07/19 1454 04/07/19 1530  AST 25 17 31   ALT 15 10 15   ALKPHOS 41 43 56  BILITOT 0.7 0.8 1.4*  PROT 5.9* 6.6 6.9  ALBUMIN 3.8 4.1 4.7   Recent Labs    04/07/19 1530 04/07/19 1530 04/08/19 0021 04/08/19 0021 04/12/19 0536 04/12/19 0536 05/18/19 0000 07/14/19 0000 08/11/19 0000  WBC 8.2   < > 6.9   < > 5.3  --  4.6 3.6 5.2  NEUTROABS 6.5  --   --   --   --   --  3 2 4   HGB 13.3   < > 12.7   < > 12.9   < > 12.2 12.4 12.4  HCT 41.2   < > 40.0   < > 42.0   < > 36 37 37  MCV 95.2  --  97.6  --  100.0  --   --   --   --   PLT 75*   < > 76*   < > 70*   < > 84* 60* 69*   < > = values in this interval not displayed.   No results for input(s): CHOL, LDLCALC, TRIG in the last 8760 hours.  Invalid input(s): HCL No results found for: [facility] Lab Results  Component Value Date   TSH 3.58 08/22/2018   Lab Results  Component Value Date   HGBA1C 5.6 11/13/2013   Lab Results  Component Value Date   CHOL 97 08/16/2017   HDL 37.70 (L) 08/16/2017   LDLCALC 48 08/16/2017   TRIG 56.0 08/16/2017   CHOLHDL 3 08/16/2017    Significant Diagnostic Results in last 30 days:  No results found.  Assessment and Plan  No problem-specific Assessment & Plan notes found for this encounter.   Labs/tests ordered:    [name], MD

## 2019-09-14 ENCOUNTER — Non-Acute Institutional Stay (SKILLED_NURSING_FACILITY): Payer: Medicare Other | Admitting: Internal Medicine

## 2019-09-14 ENCOUNTER — Encounter: Payer: Self-pay | Admitting: Internal Medicine

## 2019-09-14 DIAGNOSIS — I5030 Unspecified diastolic (congestive) heart failure: Secondary | ICD-10-CM

## 2019-09-14 DIAGNOSIS — D696 Thrombocytopenia, unspecified: Secondary | ICD-10-CM | POA: Diagnosis not present

## 2019-09-14 DIAGNOSIS — E870 Hyperosmolality and hypernatremia: Secondary | ICD-10-CM

## 2019-09-14 DIAGNOSIS — W19XXXD Unspecified fall, subsequent encounter: Secondary | ICD-10-CM

## 2019-09-14 NOTE — Progress Notes (Signed)
Location:    [facility] Room Number: 212/D Place of Service:  SNF [phone]) Provider:  Henreitta Leber, MD  Patient Care Team: [name], MD as PCP - General (Internal Medicine)  Extended Emergency Contact Information Primary Emergency Contact: [name],[name] Address: [address]          [place], [place] [address] [name] of [place] Phone: [phone] Mobile Phone: [phone] Relation: Daughter  Code Status:  DNR Goals of care: Advanced Directive information Advanced Directives 09/14/2019  Does Patient Have a Medical Advance Directive? Yes  Type of Advance Directive Out of facility DNR (pink MOST or yellow form)  Does patient want to make changes to medical advance directive? No - Patient declined  Copy of [place] in Chart? -  Would patient like information on creating a medical advance directive? -  Pre-existing out of facility DNR order (yellow form or pink MOST form) Yellow form placed in chart (order not valid for inpatient use)     Chief Complaint  Patient presents with  . Follow-up    Follow Up Fall, and Hypernatremia    HPI:  Pt is a 86 y.o. female seen today for an acute visit for follow-up of a fall last night as well as history of hypernatremia.  Patient is a long-term resident of the facility with a history of dementia with psychosis as well as diastolic CHF history of pulmonary embolism atrial fibrillation on chronic Coumadin-also history of COPD-thrombocytopenia and a history of hypernatremia.  At 1 point she was on Lasix but secondary to elevated sodium this has been discontinued.  Nonetheless her weight appears to be stable and edema appears to be well controlled.   She does have a history of hypernatremia and at times has received IV fluids-but this has been somewhat of a challenge since patient has a hard time keeping her IV line in.  Fluids have been encouraging  this is been fairly effective.  Recent sodium level was 147 on lab done on March 8.  I spoke to her about this and she says she is drinking fluids and staff has been encouraging this-I did reiterate to her that she really needs to drink her fluids and staff is aware of this as well at this point will monitor and update a lab on Monday.  Medically clinically she appears to be stable in this regards.  She also fell last night she was found sitting on the floor with her legs extended-she denied any injury and she continues to deny any pain she is sitting in her bed today appears to be comfortable without any complaints of pain.  I did reiterate to her that she needs to call before she gets out of bed she expressed understanding although I suspect this may be a challenge yet in the future.  Vital signs appear to be stable she has no complaints today     Past Medical History:  Diagnosis Date  . Acute encephalopathy   . Acute renal failure superimposed on stage 3 chronic kidney disease ([place])   . Anemia   . Atrial fibrillation ([place])   . Colon cancer ([place]) 12/11  . Colon cancer ([place]) 2011  . Diarrhea   . DVT (deep venous thrombosis) ([place]) 11/06  . Elevated brain natriuretic peptide (BNP) level   . Frequent headaches   . Heart murmur   . Hyperkalemia   . Hypertension   . Pulmonary embolism ([place])  11/06  . Seizures ([place])   . Stroke ([place]) 11/30/2014  . Urine retention 01/2019   Past Surgical History:  Procedure Laterality Date  . APPENDECTOMY  1952  . COLECTOMY  06/2010   partial, Dr. Donne Hazel complicated by LLE CVT; S/P IVC umbrella & anemia  . HIP FRACTURE SURGERY  2006   Trauma  . TOTAL ABDOMINAL HYSTERECTOMY W/ BILATERAL SALPINGOOPHORECTOMY  1973   For Fibroids  . TOTAL HIP ARTHROPLASTY  01/03/07   Left hip replacement.  Marland Kitchen VENA CAVA FILTER PLACEMENT  06/28/2010    No Known Allergies  Outpatient Encounter Medications as of 09/14/2019  Medication Sig  . acetaminophen  (TYLENOL) 500 MG tablet Take 1,000 mg by mouth every 8 (eight) hours as needed for headache (pain).   Marland Kitchen albuterol (PROVENTIL) (2.5 MG/3ML) 0.083% nebulizer solution Take 2.5 mg by nebulization every 6 (six) hours as needed for wheezing or shortness of breath.  Marland Kitchen albuterol (VENTOLIN HFA) 108 (90 Base) MCG/ACT inhaler Inhale 2 puffs into the lungs every 6 (six) hours as needed for wheezing or shortness of breath.  Marland Kitchen aspirin 81 MG chewable tablet Chew 81 mg by mouth daily.  Marland Kitchen atorvastatin (LIPITOR) 20 MG tablet Take 1 tablet (20 mg total) by mouth daily.  . Calcium Carbonate-Vitamin D (OYSTER SHELL/VITAMIN D PO) Take 1 tablet by mouth daily. With breakfast  . Ensure (ENSURE) Take 237 mLs by mouth 3 (three) times daily between meals.   Marland Kitchen escitalopram (LEXAPRO) 5 MG tablet Take 5 mg by mouth daily.  . isosorbide mononitrate (IMDUR) 30 MG 24 hr tablet Take 0.5 tablets (15 mg total) by mouth daily.  Marland Kitchen loratadine (CLARITIN) 10 MG tablet Take 1 tablet (10 mg total) by mouth daily as needed (seasonal allergies).  . metoprolol tartrate (LOPRESSOR) 25 MG tablet Take 1.5 tablets (37.5 mg total) by mouth 2 (two) times daily.  . mometasone-formoterol (DULERA) 100-5 MCG/ACT AERO Inhale 2 puffs into the lungs 2 (two) times daily.  . Multiple Vitamin (MULTIVITAMIN WITH MINERALS) TABS tablet Take 1 tablet by mouth daily.  . NON FORMULARY Diet order: Regular liberalized diet d/t poor intake.  . polyethylene glycol (MIRALAX / GLYCOLAX) packet Take 17 g by mouth daily as needed.  . promethazine (PHENERGAN) 12.5 MG tablet Take 1 tablet (12.5 mg total) by mouth 3 (three) times daily as needed for nausea or vomiting.  Marland Kitchen QUEtiapine (SEROQUEL) 25 MG tablet Take 2 tablets (50 mg total) by mouth at bedtime.  . tamsulosin (FLOMAX) 0.4 MG CAPS capsule Take 1 capsule (0.4 mg total) by mouth daily.  . verapamil (VERELAN PM) 120 MG 24 hr capsule Take 120 mg by mouth at bedtime.  Marland Kitchen warfarin (COUMADIN) 2 MG tablet Take 2 mg by mouth  See admin instructions. Take daily except M-W-F-S  . warfarin (COUMADIN) 2.5 MG tablet Take 2.5 mg by mouth every Monday, Wednesday, Friday, and Saturday at 6 PM.    No facility-administered encounter medications on file as of 09/14/2019.    Review of Systems   In general she is not complaining of any fever or chills.  Skin no complaints of rashes or itching she does have somewhat chronic bruising at times.  Head ears eyes nose mouth and throat does not complain of visual changes or sore throat.  Respiratory is not complaining of being short of breath or having cough.  Cardiac does not complain of chest pain or increasing edema.  GI is not complaining of abdominal discomfort nausea vomiting diarrhea constipation.  GU no  complaints of dysuria.  Musculoskeletal is not complaining of joint pain today at times she will complain of some intermittent knee pain but this is not really the case today.  Neurologic does not complain of dizziness headache or syncope.   Psych is not complaining of being depressed or anxious she does have some history of dementia with psychosis and agitation but this is been stable for some time on current meds including Seroquel.    Immunization History  Administered Date(s) Administered  . Fluad Quad(high Dose 65+) 03/07/2019  . Influenza Split 03/30/2012, 03/07/2019  . Influenza Whole 04/30/2006, 04/20/2008, 04/15/2009  . Influenza, High Dose Seasonal PF 04/20/2013, 03/27/2015, 03/31/2016, 04/22/2017, 04/20/2018  . Influenza,inj,Quad PF,6+ Mos 05/23/2014  . Moderna SARS-COVID-2 Vaccination 07/06/2019, 08/03/2019  . Pneumococcal Conjugate-13 04/24/2015  . Pneumococcal Polysaccharide-23 07/06/2009  . Td 10/30/2016   Pertinent  Health Maintenance Due  Topic Date Due  . DEXA SCAN  02/27/2020 (Originally 11/26/1993)  . INFLUENZA VACCINE  Completed  . PNA vac Low Risk Adult  Completed   Fall Risk  07/22/2018 01/18/2018 10/30/2016 08/14/2015 04/24/2015    Falls in the past year? 1 No No No No  Number falls in past yr: 1 - - - -  Injury with Fall? 0 - - - -   Functional Status Survey:    Vitals:   09/14/19 1503  BP: 129/60  Pulse: 72  Resp: 17  Temp: (!) 97.4 F (36.3 C)  TempSrc: Oral  SpO2: 96%  Weight: 156 lb 1.6 oz (70.8 kg)  Height: 5\' 3"  (1.6 m)   Body mass index is 27.65 kg/m. Physical Exam   In general this is a pleasant elderly female in no distress sitting on the side of her bed.  Her skin is warm and dry she has some old bruising of her arms but this is not new.  Eyes visual acuity appears to be intact sclera and conjunctive are clear.  Oropharynx clear mucous membranes moist.  Chest is clear to auscultation with somewhat shallow air entry there is no labored breathing.  Heart is regular irregular rate and rhythm with a slight systolic murmur-she has some mild right leg edema which she has occasionally pedal pulse is intact.  Abdomen is soft nontender with positive bowel sounds.  Musculoskeletal is able to move all extremities x4 at baseline there is no pain with gentle range of motion of the hips knees and arms bilaterally.  Neurologic is grossly intact her speech is clear cannot really appreciate any lateralizing findings-strength appears to be baseline all 4 extremities.  Psych she is pleasant and appropriate oriented to self and conversational.    Labs reviewed: Recent Labs    01/22/19 0405 01/22/19 0405 01/24/19 0432 03/07/19 1454 04/08/19 0021 04/09/19 0456 04/11/19 0556 04/11/19 0556 04/12/19 0536 04/12/19 0536 04/14/19 0532 05/18/19 0000 08/11/19 0000 09/07/19 0000 09/11/19 0000  NA 142   < > 144   < > 144   < > 148*   < > 147*   < > 145   < > 138 146 147  K 3.2*   < > 4.7   < > 3.5   < > 4.3   < > 4.1   < > 3.8   < > 5.1 4.5 4.0  CL 105   < > 112*   < > 107   < > 111   < > 108   < > 109   < > 108 112* 112*  CO2 27   < >  24   < > 27   < > 27   < > 25   < > 24   < > 21 26* 23*   GLUCOSE 92   < > 107*   < > 114*   < > 87  --  93  --  103*  --   --   --   --   BUN 24*   < > 25*   < > 41*   < > 31*   < > 29*   < > 26*   < > 35* 27* 19  CREATININE 1.41*   < > 1.33*   < > 1.96*   < > 1.28*   < > 1.26*   < > 1.13*   < > 1.2* 1.0 1.0  CALCIUM 8.5*   < > 9.4   < > 9.2   < > 9.0   < > 9.3   < > 9.4   < > 8.9 9.0 9.1  MG 2.1  --  2.0  --  2.2  --   --   --   --   --   --   --   --   --   --   PHOS 3.4  --   --   --  4.0  --   --   --   --   --   --   --   --   --   --    < > = values in this interval not displayed.   Recent Labs    01/20/19 0918 03/07/19 1454 04/07/19 1530  AST 25 17 31   ALT 15 10 15   ALKPHOS 41 43 56  BILITOT 0.7 0.8 1.4*  PROT 5.9* 6.6 6.9  ALBUMIN 3.8 4.1 4.7   Recent Labs    04/07/19 1530 04/07/19 1530 04/08/19 0021 04/08/19 0021 04/12/19 0536 05/18/19 0000 07/14/19 0000 08/11/19 0000 09/07/19 0000  WBC 8.2   < > 6.9   < > 5.3   < > 3.6 5.2 3.4  NEUTROABS 6.5  --   --   --   --    < > 2 4 2   HGB 13.3   < > 12.7   < > 12.9   < > 12.4 12.4 11.9*  HCT 41.2   < > 40.0   < > 42.0   < > 37 37 36  MCV 95.2  --  97.6  --  100.0  --   --   --   --   PLT 75*   < > 76*   < > 70*   < > 60* 69* 63*   < > = values in this interval not displayed.   Lab Results  Component Value Date   TSH 3.58 08/22/2018   Lab Results  Component Value Date   HGBA1C 5.3 09/07/2019   Lab Results  Component Value Date   CHOL 97 08/16/2017   HDL 37.70 (L) 08/16/2017   LDLCALC 48 08/16/2017   TRIG 56.0 08/16/2017   CHOLHDL 3 08/16/2017    Significant Diagnostic Results in last 30 days:  No results found.  Assessment/Plan  #1 hypernatremia-sodium is slowly creeping up-I have reiterated to her the importance of drinking her fluids and she expressed understanding-have also discussed this with nursing-at this point continue to encourage fluids and will update a metabolic panel early next week clinically she appears to be stable.  Recent baseline sodiums  have  ranged from the high 130s to higher 140s.  2.  History of fall with no apparent injury exam today was quite benign at this point will continue to follow fall protocol.  3.  History of diastolic CHF her weight continues to be stable she does not really have any increased edema from baseline at times will have some mild right leg edema but this is not new.  At this point continue to monitor she continues to deny any shortness of breath or chest pain has been stable for quite a while now in this regards despite being off the Lasix.  4.  History of thrombocytopenia update labs shows a platelet count of 63,000 which appears relatively stable with previous levels-there is not any increased bruising or bleeding noted.  [id number]

## 2019-09-15 ENCOUNTER — Encounter: Payer: Self-pay | Admitting: Internal Medicine

## 2019-09-17 ENCOUNTER — Encounter: Payer: Self-pay | Admitting: Internal Medicine

## 2019-09-18 ENCOUNTER — Non-Acute Institutional Stay (SKILLED_NURSING_FACILITY): Payer: Medicare Other | Admitting: Internal Medicine

## 2019-09-18 DIAGNOSIS — I10 Essential (primary) hypertension: Secondary | ICD-10-CM | POA: Diagnosis not present

## 2019-09-18 DIAGNOSIS — I4811 Longstanding persistent atrial fibrillation: Secondary | ICD-10-CM | POA: Diagnosis not present

## 2019-09-18 DIAGNOSIS — Z86711 Personal history of pulmonary embolism: Secondary | ICD-10-CM

## 2019-09-18 DIAGNOSIS — Z7901 Long term (current) use of anticoagulants: Secondary | ICD-10-CM

## 2019-09-18 DIAGNOSIS — D649 Anemia, unspecified: Secondary | ICD-10-CM | POA: Diagnosis not present

## 2019-09-18 DIAGNOSIS — J069 Acute upper respiratory infection, unspecified: Secondary | ICD-10-CM | POA: Diagnosis not present

## 2019-09-18 LAB — BASIC METABOLIC PANEL
BUN: 25 — AB (ref 4–21)
CO2: 23 — AB (ref 13–22)
Chloride: 112 — AB (ref 99–108)
Creatinine: 1 (ref 0.5–1.1)
Glucose: 89
Potassium: 4.1 (ref 3.4–5.3)
Sodium: 147 (ref 137–147)

## 2019-09-18 LAB — COMPREHENSIVE METABOLIC PANEL
Calcium: 9.2 (ref 8.7–10.7)
GFR calc Af Amer: 58.71
GFR calc non Af Amer: 50.66

## 2019-09-18 NOTE — Progress Notes (Signed)
Location:  [facility] Room Number: 212-D Place of Service:  SNF (31)  Megan Duos, MD  Patient Care Team: Megan Duos, MD as PCP - General (Internal Medicine)  Extended Emergency Contact Information Primary Emergency Contact: [name] Address: [address]          [name], [name] [address] [name] Phone: [phone] Mobile Phone: [phone] Relation: Daughter    Allergies: Patient has no known allergies.  Chief Complaint  Patient presents with  . Anticoagulation    Patient is seen for INR management    HPI: Patient is 86 y.o. female who has chronic atrial fibrillation and history of PE is anticoagulated on Coumadin who is being seen for an INR check.  Today patient's INR is 2.8 which is therapeutic.  Past Medical History:  Diagnosis Date  . Acute encephalopathy   . Acute renal failure superimposed on stage 3 chronic kidney disease ([place])   . Anemia   . Atrial fibrillation ([name])   . Colon cancer ([name]) 12/11  . Colon cancer ([place]) 2011  . Diarrhea   . DVT (deep venous thrombosis) ([name]) 11/06  . Elevated brain natriuretic peptide (BNP) level   . Frequent headaches   . Heart murmur   . Hyperkalemia   . Hypertension   . Pulmonary embolism ([name]) 11/06  . Seizures ([name])   . Stroke ([name]) 11/30/2014  . Urine retention 01/2019    Past Surgical History:  Procedure Laterality Date  . APPENDECTOMY  1952  . COLECTOMY  06/2010   partial, Dr. Donne Hazel complicated by LLE CVT; S/P IVC umbrella & anemia  . HIP FRACTURE SURGERY  2006   Trauma  . TOTAL ABDOMINAL HYSTERECTOMY W/ BILATERAL SALPINGOOPHORECTOMY  1973   For Fibroids  . TOTAL HIP ARTHROPLASTY  01/03/07   Left hip replacement.  Marland Kitchen VENA CAVA FILTER PLACEMENT  06/28/2010    Allergies as of 09/18/2019   No Known Allergies     Medication List       Accurate as of September 18, 2019  9:59 PM. If you have any questions, ask your nurse or  doctor.        acetaminophen 500 MG tablet Commonly known as: TYLENOL Take 1,000 mg by mouth every 8 (eight) hours as needed for headache (pain).   albuterol (2.5 MG/3ML) 0.083% nebulizer solution Commonly known as: PROVENTIL Take 2.5 mg by nebulization every 6 (six) hours as needed for wheezing or shortness of breath.   albuterol 108 (90 Base) MCG/ACT inhaler Commonly known as: VENTOLIN HFA Inhale 2 puffs into the lungs every 6 (six) hours as needed for wheezing or shortness of breath.   aspirin 81 MG chewable tablet Chew 81 mg by mouth daily.   atorvastatin 20 MG tablet Commonly known as: LIPITOR Take 1 tablet (20 mg total) by mouth daily.   Dulera 100-5 MCG/ACT Aero Generic drug: mometasone-formoterol Inhale 2 puffs into the lungs 2 (two) times daily.   Ensure Take 237 mLs by mouth 3 (three) times daily between meals.   escitalopram 5 MG tablet Commonly known as: LEXAPRO Take 5 mg by mouth daily.   isosorbide mononitrate 30 MG 24 hr tablet Commonly known as: IMDUR Take 0.5 tablets (15 mg total) by mouth daily.   loratadine 10 MG tablet Commonly known as: CLARITIN Take 1 tablet (10 mg total) by mouth daily as needed (seasonal allergies).   metoprolol tartrate 25 MG tablet Commonly known as: LOPRESSOR Take 1.5 tablets (  37.5 mg total) by mouth 2 (two) times daily.   multivitamin with minerals Tabs tablet Take 1 tablet by mouth daily.   NON FORMULARY Diet order: Regular liberalized diet d/t poor intake.   OYSTER SHELL/VITAMIN D PO Take 1 tablet by mouth daily. With breakfast   polyethylene glycol 17 g packet Commonly known as: MIRALAX / GLYCOLAX Take 17 g by mouth daily as needed.   promethazine 12.5 MG tablet Commonly known as: PHENERGAN Take 1 tablet (12.5 mg total) by mouth 3 (three) times daily as needed for nausea or vomiting.   QUEtiapine 25 MG tablet Commonly known as: SEROQUEL Take 2 tablets (50 mg total) by mouth at bedtime.   tamsulosin 0.4  MG Caps capsule Commonly known as: FLOMAX Take 1 capsule (0.4 mg total) by mouth daily.   verapamil 120 MG 24 hr capsule Commonly known as: VERELAN PM Take 120 mg by mouth at bedtime.   warfarin 2.5 MG tablet Commonly known as: COUMADIN Take as directed by the anticoagulation clinic. If you are unsure how to take this medication, talk to your nurse or doctor. Original instructions: Take 2.5 mg by mouth every Monday, Wednesday, Friday, and Saturday at 6 PM.   warfarin 2 MG tablet Commonly known as: COUMADIN Take as directed by the anticoagulation clinic. If you are unsure how to take this medication, talk to your nurse or doctor. Original instructions: Take 2 mg by mouth See admin instructions. Take daily except M-W-F-S       No orders of the defined types were placed in this encounter.   Immunization History  Administered Date(s) Administered  . Fluad Quad(high Dose 65+) 03/07/2019  . Influenza Split 03/30/2012, 03/07/2019  . Influenza Whole 04/30/2006, 04/20/2008, 04/15/2009  . Influenza, High Dose Seasonal PF 04/20/2013, 03/27/2015, 03/31/2016, 04/22/2017, 04/20/2018  . Influenza,inj,Quad PF,6+ Mos 05/23/2014  . Moderna SARS-COVID-2 Vaccination 07/06/2019, 08/03/2019  . Pneumococcal Conjugate-13 04/24/2015  . Pneumococcal Polysaccharide-23 07/06/2009  . Td 10/30/2016    Social History   Tobacco Use  . Smoking status: Passive Smoke Exposure - Never Smoker  . Smokeless tobacco: Never Used  . Tobacco comment: husband smoked in home.   Substance Use Topics  . Alcohol use: No    Alcohol/week: 0.0 standard drinks     Vitals:   09/18/19 1651  BP: 110/64  Pulse: 71  Resp: 18  Temp: 97.8 F (36.6 C)   Body mass index is 27.79 kg/m.   Patient Active Problem List   Diagnosis Date Noted  . Altered behavior 05/10/2019  . Klebsiella cystitis 04/22/2019  . Metabolic encephalopathy [id number]  . Back pain 04/08/2019  . MCI (mild cognitive impairment) 03/07/2019    . Congestive heart failure (CHF) ([name]) 01/20/2019  . CAD (coronary artery disease) 06/07/2018  . Pulmonary hypertension, primary ([name]) 06/07/2018  . Generalized weakness   . Abnormal LFTs 06/01/2018  . Hallucination 06/01/2018  . Abnormal chest x-ray 09/28/2015  . Edema of right lower extremity 09/28/2015  . Elevated brain natriuretic peptide (BNP) level 09/17/2015  . Seizures ([place]) 09/16/2015  . Asthma 08/14/2015  . PCP NOTES >>>>>>>>>>>>>>>>>>>>>>>>>>>>>> 04/24/2015  . TIA (transient ischemic attack) 12/11/2014  . Dyslipidemia 12/11/2014  . History of CVA (cerebrovascular accident) 12/01/2014  . Thrombocytopenia ([place]) 12/01/2014  . Chronic renal insufficiency, stage II (mild) 11/12/2013  . Impingement syndrome, shoulder, left 11/09/2013  . Acquired short bowel syndrome 11/09/2013  . Annual physical exam 08/30/2013  . Hx pulmonary embolism 06/27/2013  . Warfarin anticoagulation 06/27/2013  . DDD (degenerative  disc disease), lumbar 11/18/2012  . ATRIAL FIBRILLATION   07/08/2010  . COLON CANCER, HX OF 07/08/2010  . DIZZINESS 10/18/2009  . Essential hypertension 12/16/2007  . HIP PAIN, CHRONIC 12/28/2006  . ANEMIA-NOS 07/15/2006  . Personal history of venous thrombosis and embolism 07/15/2006    CMP     Component Value Date/Time   NA 147 09/18/2019 0000   NA 142 06/09/2012 1509   K 4.1 09/18/2019 0000   K 3.9 06/09/2012 1509   CL 112 (A) 09/18/2019 0000   CL 108 (H) 06/09/2012 1509   CO2 23 (A) 09/18/2019 0000   CO2 26 06/09/2012 1509   GLUCOSE 103 (H) 04/14/2019 0532   GLUCOSE 103 (H) 06/09/2012 1509   BUN 25 (A) 09/18/2019 0000   BUN 17.0 06/09/2012 1509   CREATININE 1.0 09/18/2019 0000   CREATININE 1.13 (H) 04/14/2019 0532   CREATININE 1.51 (H) 07/22/2018 1624   CREATININE 1.2 (H) 06/09/2012 1509   CALCIUM 9.2 09/18/2019 0000   CALCIUM 9.5 06/09/2012 1509   PROT 6.9 04/07/2019 1530   PROT 6.9 01/13/2018 1620   PROT 7.2 06/09/2012 1509   ALBUMIN 4.7  04/07/2019 1530   ALBUMIN 4.6 01/13/2018 1620   ALBUMIN 4.2 06/09/2012 1509   AST 31 04/07/2019 1530   AST 17 06/09/2012 1509   ALT 15 04/07/2019 1530   ALT 11 06/09/2012 1509   ALKPHOS 56 04/07/2019 1530   ALKPHOS 47 06/09/2012 1509   BILITOT 1.4 (H) 04/07/2019 1530   BILITOT 1.1 01/13/2018 1620   BILITOT 0.73 06/09/2012 1509   GFRNONAA 50.66 09/18/2019 0000   GFRAA 58.71 09/18/2019 0000   Recent Labs    01/22/19 0405 01/22/19 0405 01/24/19 0432 03/07/19 1454 04/08/19 0021 04/09/19 0456 04/11/19 0556 04/11/19 0556 04/12/19 0536 04/12/19 0536 04/14/19 0532 05/18/19 0000 09/07/19 0000 09/11/19 0000 09/18/19 0000  NA 142   < > 144   < > 144   < > 148*   < > 147*   < > 145   < > 146 147 147  K 3.2*   < > 4.7   < > 3.5   < > 4.3   < > 4.1   < > 3.8   < > 4.5 4.0 4.1  CL 105   < > 112*   < > 107   < > 111   < > 108   < > 109   < > 112* 112* 112*  CO2 27   < > 24   < > 27   < > 27   < > 25   < > 24   < > 26* 23* 23*  GLUCOSE 92   < > 107*   < > 114*   < > 87  --  93  --  103*  --   --   --   --   BUN 24*   < > 25*   < > 41*   < > 31*   < > 29*   < > 26*   < > 27* 19 25*  CREATININE 1.41*   < > 1.33*   < > 1.96*   < > 1.28*   < > 1.26*   < > 1.13*   < > 1.0 1.0 1.0  CALCIUM 8.5*   < > 9.4   < > 9.2   < > 9.0   < > 9.3   < > 9.4   < > 9.0 9.1 9.2  MG 2.1  --  2.0  --  2.2  --   --   --   --   --   --   --   --   --   --   PHOS 3.4  --   --   --  4.0  --   --   --   --   --   --   --   --   --   --    < > = values in this interval not displayed.   Recent Labs    01/20/19 0918 03/07/19 1454 04/07/19 1530  AST 25 17 31   ALT 15 10 15   ALKPHOS 41 43 56  BILITOT 0.7 0.8 1.4*  PROT 5.9* 6.6 6.9  ALBUMIN 3.8 4.1 4.7   Recent Labs    04/07/19 1530 04/07/19 1530 04/08/19 0021 04/08/19 0021 04/12/19 0536 05/18/19 0000 07/14/19 0000 08/11/19 0000 09/07/19 0000  WBC 8.2   < > 6.9   < > 5.3   < > 3.6 5.2 3.4  NEUTROABS 6.5  --   --   --   --    < > 2 4 2   HGB 13.3   < >  12.7   < > 12.9   < > 12.4 12.4 11.9*  HCT 41.2   < > 40.0   < > 42.0   < > 37 37 36  MCV 95.2  --  97.6  --  100.0  --   --   --   --   PLT 75*   < > 76*   < > 70*   < > 60* 69* 63*   < > = values in this interval not displayed.   No results for input(s): CHOL, LDLCALC, TRIG in the last 8760 hours.  Invalid input(s): HCL No results found for: [facility] Lab Results  Component Value Date   TSH 3.58 08/22/2018   Lab Results  Component Value Date   HGBA1C 5.3 09/07/2019   Lab Results  Component Value Date   CHOL 97 08/16/2017   HDL 37.70 (L) 08/16/2017   LDLCALC 48 08/16/2017   TRIG 56.0 08/16/2017   CHOLHDL 3 08/16/2017    Significant Diagnostic Results in last 30 days:  No results found.  Assessment and Plan  Chronic atrial fibrillation/history of PE/anticoagulated on Coumadin-today patient's INR is 2.8, last week her INR was 2.5 on Coumadin 2.5 mg Monday Wednesday Friday and 2.0 mg on Tuesday Thursday Saturday and Sunday.  We will continue current regimen and repeat in 1 week    [name] , MD

## 2019-09-19 ENCOUNTER — Encounter: Payer: Self-pay | Admitting: Internal Medicine

## 2019-09-19 NOTE — Progress Notes (Signed)
Location:      Place of Service:     Megan Duos, MD  Patient Care Team: Megan Duos, MD as PCP - General (Internal Medicine)  Extended Emergency Contact Information Primary Emergency Contact: [facility] Address: [address]          [place], [place] [address] [name] of [place] Phone: [phone] Mobile Phone: [phone] Relation: Daughter    Allergies: Patient has no known allergies.  Chief Complaint  Patient presents with  . Error    HPI: Patient is 86 y.o. female who   Past Medical History:  Diagnosis Date  . Acute encephalopathy   . Acute renal failure superimposed on stage 3 chronic kidney disease ([place])   . Anemia   . Atrial fibrillation ([place])   . Colon cancer ([place]) 12/11  . Colon cancer ([place]) 2011  . Diarrhea   . DVT (deep venous thrombosis) ([place]) 11/06  . Elevated brain natriuretic peptide (BNP) level   . Frequent headaches   . Heart murmur   . Hyperkalemia   . Hypertension   . Pulmonary embolism ([place]) 11/06  . Seizures ([place])   . Stroke ([place]) 11/30/2014  . Urine retention 01/2019    Past Surgical History:  Procedure Laterality Date  . APPENDECTOMY  1952  . COLECTOMY  06/2010   partial, Dr. Donne Hazel complicated by LLE CVT; S/P IVC umbrella & anemia  . HIP FRACTURE SURGERY  2006   Trauma  . TOTAL ABDOMINAL HYSTERECTOMY W/ BILATERAL SALPINGOOPHORECTOMY  1973   For Fibroids  . TOTAL HIP ARTHROPLASTY  01/03/07   Left hip replacement.  Marland Kitchen VENA CAVA FILTER PLACEMENT  06/28/2010    Allergies as of 09/15/2019   No Known Allergies     Medication List       Accurate as of September 15, 2019 11:59 PM. If you have any questions, ask your nurse or doctor.        acetaminophen 500 MG tablet Commonly known as: TYLENOL Take 1,000 mg by mouth every 8 (eight) hours as needed for headache (pain).   albuterol (2.5 MG/3ML) 0.083% nebulizer solution Commonly known as: PROVENTIL Take 2.5 mg by nebulization every 6 (six)  hours as needed for wheezing or shortness of breath.   albuterol 108 (90 Base) MCG/ACT inhaler Commonly known as: VENTOLIN HFA Inhale 2 puffs into the lungs every 6 (six) hours as needed for wheezing or shortness of breath.   aspirin 81 MG chewable tablet Chew 81 mg by mouth daily.   atorvastatin 20 MG tablet Commonly known as: LIPITOR Take 1 tablet (20 mg total) by mouth daily.   Dulera 100-5 MCG/ACT Aero Generic drug: mometasone-formoterol Inhale 2 puffs into the lungs 2 (two) times daily.   Ensure Take 237 mLs by mouth 3 (three) times daily between meals.   escitalopram 5 MG tablet Commonly known as: LEXAPRO Take 5 mg by mouth daily.   isosorbide mononitrate 30 MG 24 hr tablet Commonly known as: IMDUR Take 0.5 tablets (15 mg total) by mouth daily.   loratadine 10 MG tablet Commonly known as: CLARITIN Take 1 tablet (10 mg total) by mouth daily as needed (seasonal allergies).   metoprolol tartrate 25 MG tablet Commonly known as: LOPRESSOR Take 1.5 tablets (37.5 mg total) by mouth 2 (two) times daily.   multivitamin with minerals Tabs tablet Take 1 tablet by mouth daily.   NON FORMULARY Diet order: Regular liberalized diet d/t poor intake.   OYSTER SHELL/VITAMIN D PO Take 1 tablet by  mouth daily. With breakfast   polyethylene glycol 17 g packet Commonly known as: MIRALAX / GLYCOLAX Take 17 g by mouth daily as needed.   promethazine 12.5 MG tablet Commonly known as: PHENERGAN Take 1 tablet (12.5 mg total) by mouth 3 (three) times daily as needed for nausea or vomiting.   tamsulosin 0.4 MG Caps capsule Commonly known as: FLOMAX Take 1 capsule (0.4 mg total) by mouth daily.   verapamil 120 MG 24 hr capsule Commonly known as: VERELAN PM Take 120 mg by mouth at bedtime.   warfarin 2.5 MG tablet Commonly known as: COUMADIN Take as directed by the anticoagulation clinic. If you are unsure how to take this medication, talk to your nurse or doctor. Original  instructions: Take 2.5 mg by mouth every Monday, Wednesday, Friday, and Saturday at 6 PM.   warfarin 2 MG tablet Commonly known as: COUMADIN Take as directed by the anticoagulation clinic. If you are unsure how to take this medication, talk to your nurse or doctor. Original instructions: Take 2 mg by mouth See admin instructions. Take daily except M-W-F-S       No orders of the defined types were placed in this encounter.   Immunization History  Administered Date(s) Administered  . Fluad Quad(high Dose 65+) 03/07/2019  . Influenza Split 03/30/2012, 03/07/2019  . Influenza Whole 04/30/2006, 04/20/2008, 04/15/2009  . Influenza, High Dose Seasonal PF 04/20/2013, 03/27/2015, 03/31/2016, 04/22/2017, 04/20/2018  . Influenza,inj,Quad PF,6+ Mos 05/23/2014  . Moderna SARS-COVID-2 Vaccination 07/06/2019, 08/03/2019  . Pneumococcal Conjugate-13 04/24/2015  . Pneumococcal Polysaccharide-23 07/06/2009  . Td 10/30/2016    Social History   Tobacco Use  . Smoking status: Passive Smoke Exposure - Never Smoker  . Smokeless tobacco: Never Used  . Tobacco comment: husband smoked in home.   Substance Use Topics  . Alcohol use: No    Alcohol/week: 0.0 standard drinks    Review of Systems  DATA OBTAINED: from patient, nurse, medical record, family member GENERAL:  no fevers, fatigue, appetite changes SKIN: No itching, rash HEENT: No complaint RESPIRATORY: No cough, wheezing, SOB CARDIAC: No chest pain, palpitations, lower extremity edema  GI: No abdominal pain, No N/V/D or constipation, No heartburn or reflux  GU: No dysuria, frequency or urgency, or incontinence  MUSCULOSKELETAL: No unrelieved bone/joint pain NEUROLOGIC: No headache, dizziness  PSYCHIATRIC: No overt anxiety or sadness  There were no vitals filed for this visit. There is no height or weight on file to calculate BMI. Physical Exam  GENERAL APPEARANCE: Alert, conversant, No acute distress  SKIN: No diaphoresis  rash HEENT: Unremarkable RESPIRATORY: Breathing is even, unlabored. Lung sounds are clear   CARDIOVASCULAR: Heart RRR no murmurs, rubs or gallops. No peripheral edema  GASTROINTESTINAL: Abdomen is soft, non-tender, not distended w/ normal bowel sounds.  GENITOURINARY: Bladder non tender, not distended  MUSCULOSKELETAL: No abnormal joints or musculature NEUROLOGIC: Cranial nerves 2-12 grossly intact. Moves all extremities PSYCHIATRIC: Mood and affect appropriate to situation, no behavioral issues  Patient Active Problem List   Diagnosis Date Noted  . Altered behavior 05/10/2019  . Klebsiella cystitis 04/22/2019  . Metabolic encephalopathy [id number]  . Back pain 04/08/2019  . MCI (mild cognitive impairment) 03/07/2019  . Congestive heart failure (CHF) ([place]) 01/20/2019  . CAD (coronary artery disease) 06/07/2018  . Pulmonary hypertension, primary ([place]) 06/07/2018  . Generalized weakness   . Abnormal LFTs 06/01/2018  . Hallucination 06/01/2018  . Abnormal chest x-ray 09/28/2015  . Edema of right lower extremity 09/28/2015  . Elevated brain  natriuretic peptide (BNP) level 09/17/2015  . Seizures ([place]) 09/16/2015  . Supratherapeutic INR 08/30/2015  . Asthma 08/14/2015  . PCP NOTES >>>>>>>>>>>>>>>>>>>>>>>>>>>>>> 04/24/2015  . TIA (transient ischemic attack) 12/11/2014  . Dyslipidemia 12/11/2014  . History of CVA (cerebrovascular accident) 12/01/2014  . Thrombocytopenia ([place]) 12/01/2014  . Chronic renal insufficiency, stage II (mild) 11/12/2013  . Impingement syndrome, shoulder, left 11/09/2013  . Acquired short bowel syndrome 11/09/2013  . Annual physical exam 08/30/2013  . Hx pulmonary embolism 06/27/2013  . Warfarin anticoagulation 06/27/2013  . DDD (degenerative disc disease), lumbar 11/18/2012  . ATRIAL FIBRILLATION   07/08/2010  . COLON CANCER, HX OF 07/08/2010  . DIZZINESS 10/18/2009  . Essential hypertension 12/16/2007  . HIP PAIN, CHRONIC 12/28/2006  . ANEMIA-NOS  07/15/2006  . Personal history of venous thrombosis and embolism 07/15/2006    CMP     Component Value Date/Time   NA 146 10/20/2019 0000   NA 142 06/09/2012 1509   K 4.4 10/20/2019 0000   K 3.9 06/09/2012 1509   CL 112 (A) 10/20/2019 0000   CL 108 (H) 06/09/2012 1509   CO2 21 10/20/2019 0000   CO2 26 06/09/2012 1509   GLUCOSE 103 (H) 04/14/2019 0532   GLUCOSE 103 (H) 06/09/2012 1509   BUN 24 (A) 10/20/2019 0000   BUN 17.0 06/09/2012 1509   CREATININE 0.8 10/20/2019 0000   CREATININE 1.13 (H) 04/14/2019 0532   CREATININE 1.51 (H) 07/22/2018 1624   CREATININE 1.2 (H) 06/09/2012 1509   CALCIUM 8.5 (A) 10/20/2019 0000   CALCIUM 9.5 06/09/2012 1509   PROT 6.9 04/07/2019 1530   PROT 6.9 01/13/2018 1620   PROT 7.2 06/09/2012 1509   ALBUMIN 4.7 04/07/2019 1530   ALBUMIN 4.6 01/13/2018 1620   ALBUMIN 4.2 06/09/2012 1509   AST 31 04/07/2019 1530   AST 17 06/09/2012 1509   ALT 15 04/07/2019 1530   ALT 11 06/09/2012 1509   ALKPHOS 56 04/07/2019 1530   ALKPHOS 47 06/09/2012 1509   BILITOT 1.4 (H) 04/07/2019 1530   BILITOT 1.1 01/13/2018 1620   BILITOT 0.73 06/09/2012 1509   GFRNONAA 65.70 10/20/2019 0000   GFRAA 76.14 10/20/2019 0000   Recent Labs    01/22/19 0405 01/22/19 0405 01/24/19 0432 03/07/19 1454 04/08/19 0021 04/09/19 0456 04/11/19 0556 04/11/19 0556 04/12/19 0536 04/12/19 0536 04/14/19 0532 05/18/19 0000 09/27/19 0000 10/10/19 0000 10/20/19 0000  NA 142   < > 144   < > 144   < > 148*   < > 147*   < > 145   < > 144 146 146  K 3.2*   < > 4.7   < > 3.5   < > 4.3   < > 4.1   < > 3.8   < > 4.7 4.5 4.4  CL 105   < > 112*   < > 107   < > 111   < > 108   < > 109   < > 109* 111* 112*  CO2 27   < > 24   < > 27   < > 27   < > 25   < > 24   < > 22 20 21   GLUCOSE 92   < > 107*   < > 114*   < > 87  --  93  --  103*  --   --   --   --   BUN 24*   < > 25*   < > 41*   < >  31*   < > 29*   < > 26*   < > 39* 30* 24*  CREATININE 1.41*   < > 1.33*   < > 1.96*   < > 1.28*    < > 1.26*   < > 1.13*   < > 1.1 1.1 0.8  CALCIUM 8.5*   < > 9.4   < > 9.2   < > 9.0   < > 9.3   < > 9.4   < > 8.8 8.9 8.5*  MG 2.1  --  2.0  --  2.2  --   --   --   --   --   --   --   --   --   --   PHOS 3.4  --   --   --  4.0  --   --   --   --   --   --   --   --   --   --    < > = values in this interval not displayed.   Recent Labs    01/20/19 0918 03/07/19 1454 04/07/19 1530  AST 25 17 31   ALT 15 10 15   ALKPHOS 41 43 56  BILITOT 0.7 0.8 1.4*  PROT 5.9* 6.6 6.9  ALBUMIN 3.8 4.1 4.7   Recent Labs    04/07/19 1530 04/07/19 1530 04/08/19 0021 04/08/19 0021 04/12/19 0536 05/18/19 0000 07/14/19 0000 08/11/19 0000 09/07/19 0000  WBC 8.2   < > 6.9   < > 5.3   < > 3.6 5.2 3.4  NEUTROABS 6.5  --   --   --   --    < > 2 4 2   HGB 13.3   < > 12.7   < > 12.9   < > 12.4 12.4 11.9*  HCT 41.2   < > 40.0   < > 42.0   < > 37 37 36  MCV 95.2  --  97.6  --  100.0  --   --   --   --   PLT 75*   < > 76*   < > 70*   < > 60* 69* 63*   < > = values in this interval not displayed.   No results for input(s): CHOL, LDLCALC, TRIG in the last 8760 hours.  Invalid input(s): HCL No results found for: [facility] Lab Results  Component Value Date   TSH 3.58 08/22/2018   Lab Results  Component Value Date   HGBA1C 5.3 09/07/2019   Lab Results  Component Value Date   CHOL 97 08/16/2017   HDL 37.70 (L) 08/16/2017   LDLCALC 48 08/16/2017   TRIG 56.0 08/16/2017   CHOLHDL 3 08/16/2017    Significant Diagnostic Results in last 30 days:  No results found.  Assessment and Plan  No problem-specific Assessment & Plan notes found for this encounter.   Labs/tests ordered:    Megan Duos, MD   This encounter was created in error - please disregard.

## 2019-09-20 DIAGNOSIS — F22 Delusional disorders: Secondary | ICD-10-CM | POA: Diagnosis not present

## 2019-09-20 DIAGNOSIS — R443 Hallucinations, unspecified: Secondary | ICD-10-CM | POA: Diagnosis not present

## 2019-09-25 ENCOUNTER — Non-Acute Institutional Stay (SKILLED_NURSING_FACILITY): Payer: Medicare Other | Admitting: Internal Medicine

## 2019-09-25 ENCOUNTER — Encounter: Payer: Self-pay | Admitting: Internal Medicine

## 2019-09-25 DIAGNOSIS — R791 Abnormal coagulation profile: Secondary | ICD-10-CM

## 2019-09-25 DIAGNOSIS — E870 Hyperosmolality and hypernatremia: Secondary | ICD-10-CM

## 2019-09-25 DIAGNOSIS — I4811 Longstanding persistent atrial fibrillation: Secondary | ICD-10-CM | POA: Diagnosis not present

## 2019-09-25 DIAGNOSIS — M1120 Other chondrocalcinosis, unspecified site: Secondary | ICD-10-CM

## 2019-09-25 NOTE — Progress Notes (Signed)
Location:    [facility] Room Number: 212/D Place of Service:  SNF [phone]) Provider:  Henreitta Leber, MD  Patient Care Team: [name], MD as PCP - General (Internal Medicine)  Extended Emergency Contact Information Primary Emergency Contact: [name],[name] Address: [address]          [place], [place] [address] [name] of [place] Phone: [phone] Mobile Phone: [phone] Relation: Daughter  Code Status:  Full Code Goals of care: Advanced Directive information Advanced Directives 09/25/2019  Does Patient Have a Medical Advance Directive? Yes  Type of Advance Directive Out of facility DNR (pink MOST or yellow form)  Does patient want to make changes to medical advance directive? No - Patient declined  Copy of [place] in Chart? -  Would patient like information on creating a medical advance directive? -  Pre-existing out of facility DNR order (yellow form or pink MOST form) Yellow form placed in chart (order not valid for inpatient use)     Chief Complaint  Patient presents with  . Anticoagulation    Anticoagulation Management  Also pain and swelling of right second finger  HPI:  Pt is a 86 y.o. female seen today for an acute visit for anticoagulation management as well as some pain and swelling of her right second finger.  Patient has a history of chronic atrial fibrillation and history of PE is on chronic anticoagulation with Coumadin.  Her INR today is somewhat elevated at 3.4 there has been no evidence of any increased bruising or bleeding.  She does have some chronic bruising but this is not new.  She is on Coumadin 2.5 mg Monday Wednesday Friday and Saturday and 2 mg Tuesday Thursday and Sunday.  INR last week was 2.8 previous week was 2.5.  She also has some swelling and pain of her right second finger-she has had a previous history of this thought to be pseudogout she  did respond well to a course of prednisone.  She does not complain of any recent trauma.  X-rays previously did not really show any acute process.     Past Medical History:  Diagnosis Date  . Acute encephalopathy   . Acute renal failure superimposed on stage 3 chronic kidney disease ([place])   . Anemia   . Atrial fibrillation ([place])   . Colon cancer ([place]) 12/11  . Colon cancer ([place]) 2011  . Diarrhea   . DVT (deep venous thrombosis) ([place]) 11/06  . Elevated brain natriuretic peptide (BNP) level   . Frequent headaches   . Heart murmur   . Hyperkalemia   . Hypertension   . Pulmonary embolism ([place]) 11/06  . Seizures ([place])   . Stroke ([place]) 11/30/2014  . Urine retention 01/2019   Past Surgical History:  Procedure Laterality Date  . APPENDECTOMY  1952  . COLECTOMY  06/2010   partial, Dr. Donne Hazel complicated by LLE CVT; S/P IVC umbrella & anemia  . HIP FRACTURE SURGERY  2006   Trauma  . TOTAL ABDOMINAL HYSTERECTOMY W/ BILATERAL SALPINGOOPHORECTOMY  1973   For Fibroids  . TOTAL HIP ARTHROPLASTY  01/03/07   Left hip replacement.  Marland Kitchen VENA CAVA FILTER PLACEMENT  06/28/2010    No Known Allergies  Outpatient Encounter Medications as of 09/25/2019  Medication Sig  . acetaminophen (TYLENOL) 500 MG tablet Take 1,000 mg by mouth every 8 (eight) hours as needed for headache (pain).   Marland Kitchen albuterol (PROVENTIL) (2.5  MG/3ML) 0.083% nebulizer solution Take 2.5 mg by nebulization every 6 (six) hours as needed for wheezing or shortness of breath.  Marland Kitchen albuterol (VENTOLIN HFA) 108 (90 Base) MCG/ACT inhaler Inhale 2 puffs into the lungs every 6 (six) hours as needed for wheezing or shortness of breath.  Marland Kitchen aspirin 81 MG chewable tablet Chew 81 mg by mouth daily.  Marland Kitchen atorvastatin (LIPITOR) 20 MG tablet Take 1 tablet (20 mg total) by mouth daily.  . Calcium Carbonate-Vitamin D (OYSTER SHELL/VITAMIN D PO) Take 1 tablet by mouth daily. With breakfast  . Ensure (ENSURE) Take 237 mLs by mouth 3 (three) times  daily between meals.   Marland Kitchen escitalopram (LEXAPRO) 5 MG tablet Take 5 mg by mouth daily.  . isosorbide mononitrate (IMDUR) 30 MG 24 hr tablet Take 0.5 tablets (15 mg total) by mouth daily.  Marland Kitchen loratadine (CLARITIN) 10 MG tablet Take 1 tablet (10 mg total) by mouth daily as needed (seasonal allergies).  . metoprolol tartrate (LOPRESSOR) 25 MG tablet Take 1.5 tablets (37.5 mg total) by mouth 2 (two) times daily.  . mometasone-formoterol (DULERA) 100-5 MCG/ACT AERO Inhale 2 puffs into the lungs 2 (two) times daily.  . Multiple Vitamin (MULTIVITAMIN WITH MINERALS) TABS tablet Take 1 tablet by mouth daily.  . NON FORMULARY Diet order: Regular liberalized diet d/t poor intake.  . nystatin (NYSTATIN) powder Apply topically. Apply under bilateral breasts QD  . polyethylene glycol (MIRALAX / GLYCOLAX) packet Take 17 g by mouth daily as needed.  . predniSONE (DELTASONE) 20 MG tablet On 09/25/2019 give 3 tablets to equal 60 mg PO Qd x 1 day. Then on 09/26/2019 give 2 tablets to equal 40 mg PO x 1 day. Then on 09/27/2019 give one tablet by mouth QD x 3 days until 09/29/2019  . promethazine (PHENERGAN) 12.5 MG tablet Take 1 tablet (12.5 mg total) by mouth 3 (three) times daily as needed for nausea or vomiting.  Marland Kitchen QUEtiapine (SEROQUEL) 25 MG tablet Take 2 tablets (50 mg total) by mouth at bedtime.  . tamsulosin (FLOMAX) 0.4 MG CAPS capsule Take 1 capsule (0.4 mg total) by mouth daily.  . verapamil (VERELAN PM) 120 MG 24 hr capsule Take 120 mg by mouth at bedtime.  . [DISCONTINUED] warfarin (COUMADIN) 2 MG tablet Take 2 mg by mouth See admin instructions. Take daily except M-W-F-S  . [DISCONTINUED] warfarin (COUMADIN) 2.5 MG tablet Take 2.5 mg by mouth every Monday, Wednesday, Friday, and Saturday at 6 PM.    No facility-administered encounter medications on file as of 09/25/2019.    Review of Systems   In general she is not complaining of any fever or chills.  Skin does not complain of rashes or itching.  Head  ears eyes nose mouth and throat is not complain of visual changes or sore throat.  Respiratory does not complain of having a cough or increased shortness of breath at this point.  Cardiac does not complain of chest pain continues to have fairly minimal edema.  GI is not complaining of abdominal discomfort nausea vomiting diarrhea constipation.  GU no complaints of dysuria.  Musculoskeletal is complaining of some right second finger discomfort and swelling otherwise denies joint pain.  Neurologic does not complain of dizziness headache numbness or syncope.  And psych is not complaining of being depressed or anxious appears to be in good spirits nursing does not report any recent behaviors.    Immunization History  Administered Date(s) Administered  . Fluad Quad(high Dose 65+) 03/07/2019  . Influenza  Split 03/30/2012, 03/07/2019  . Influenza Whole 04/30/2006, 04/20/2008, 04/15/2009  . Influenza, High Dose Seasonal PF 04/20/2013, 03/27/2015, 03/31/2016, 04/22/2017, 04/20/2018  . Influenza,inj,Quad PF,6+ Mos 05/23/2014  . Moderna SARS-COVID-2 Vaccination 07/06/2019, 08/03/2019  . Pneumococcal Conjugate-13 04/24/2015  . Pneumococcal Polysaccharide-23 07/06/2009  . Td 10/30/2016   Pertinent  Health Maintenance Due  Topic Date Due  . DEXA SCAN  02/27/2020 (Originally 11/26/1993)  . INFLUENZA VACCINE  Completed  . PNA vac Low Risk Adult  Completed   Fall Risk  07/22/2018 01/18/2018 10/30/2016 08/14/2015 04/24/2015  Falls in the past year? 1 No No No No  Number falls in past yr: 1 - - - -  Injury with Fall? 0 - - - -   Functional Status Survey:    Vitals:   09/25/19 1554  BP: (!) 110/50  Pulse: (!) 56  Resp: 17  Temp: (!) 97.5 F (36.4 C)  TempSrc: Oral  SpO2: 96%  Weight: 156 lb 4.8 oz (70.9 kg)  Height: 5\' 3"  (1.6 m)   Body mass index is 27.69 kg/m. Physical Exam In general this is a pleasant elderly female in no distress lying comfortably in bed.  Her skin is warm and  dry she does have some chronic bruising but I do not see new bruising.  Eyes visual acuity appears to be intact sclera and conjunctive are clear.  Oropharynx is clear mucous membranes moist.  Chest is clear to auscultation with shallow air entry there is no labored breathing.  Heart is regular irregular rate and rhythm with a baseline systolic murmur-she has mild right leg edema which appears unchanged.  Abdomen is soft nontender with positive bowel sounds.  Musculoskeletal does move all extremities x4 months noticeable finding is some erythema and swelling at the base of her right second finger-this is somewhat tender to touch and slightly warm.  Does move her fingers at baseline but there is some discomfort because of the swelling of the right second finger.  Neurologic is grossly intact her speech is clear cannot appreciate lateralizing findings.  Psych she continues to be pleasant and appropriate oriented to self and continues to be conversational Labs reviewed:  INR is as noted above.  INR today is 3.4.  September 18, 2019.  Sodium 147 potassium 4.1 creatinine 0.98 creatinine 24.5.  September 07, 2019.  WBC 3.4 hemoglobin 11.9 platelets 63,000.   Recent Labs    01/22/19 0405 01/22/19 0405 01/24/19 0432 03/07/19 1454 04/08/19 0021 04/09/19 0456 04/11/19 0556 04/11/19 0556 04/12/19 0536 04/12/19 0536 04/14/19 0532 05/18/19 0000 09/07/19 0000 09/11/19 0000 09/18/19 0000  NA 142   < > 144   < > 144   < > 148*   < > 147*   < > 145   < > 146 147 147  K 3.2*   < > 4.7   < > 3.5   < > 4.3   < > 4.1   < > 3.8   < > 4.5 4.0 4.1  CL 105   < > 112*   < > 107   < > 111   < > 108   < > 109   < > 112* 112* 112*  CO2 27   < > 24   < > 27   < > 27   < > 25   < > 24   < > 26* 23* 23*  GLUCOSE 92   < > 107*   < > 114*   < > 87  --  93  --  103*  --   --   --   --   BUN 24*   < > 25*   < > 41*   < > 31*   < > 29*   < > 26*   < > 27* 19 25*  CREATININE 1.41*   < > 1.33*   < > 1.96*    < > 1.28*   < > 1.26*   < > 1.13*   < > 1.0 1.0 1.0  CALCIUM 8.5*   < > 9.4   < > 9.2   < > 9.0   < > 9.3   < > 9.4   < > 9.0 9.1 9.2  MG 2.1  --  2.0  --  2.2  --   --   --   --   --   --   --   --   --   --   PHOS 3.4  --   --   --  4.0  --   --   --   --   --   --   --   --   --   --    < > = values in this interval not displayed.   Recent Labs    01/20/19 0918 03/07/19 1454 04/07/19 1530  AST 25 17 31   ALT 15 10 15   ALKPHOS 41 43 56  BILITOT 0.7 0.8 1.4*  PROT 5.9* 6.6 6.9  ALBUMIN 3.8 4.1 4.7   Recent Labs    04/07/19 1530 04/07/19 1530 04/08/19 0021 04/08/19 0021 04/12/19 0536 05/18/19 0000 07/14/19 0000 08/11/19 0000 09/07/19 0000  WBC 8.2   < > 6.9   < > 5.3   < > 3.6 5.2 3.4  NEUTROABS 6.5  --   --   --   --    < > 2 4 2   HGB 13.3   < > 12.7   < > 12.9   < > 12.4 12.4 11.9*  HCT 41.2   < > 40.0   < > 42.0   < > 37 37 36  MCV 95.2  --  97.6  --  100.0  --   --   --   --   PLT 75*   < > 76*   < > 70*   < > 60* 69* 63*   < > = values in this interval not displayed.   Lab Results  Component Value Date   TSH 3.58 08/22/2018   Lab Results  Component Value Date   HGBA1C 5.3 09/07/2019   Lab Results  Component Value Date   CHOL 97 08/16/2017   HDL 37.70 (L) 08/16/2017   LDLCALC 48 08/16/2017   TRIG 56.0 08/16/2017   CHOLHDL 3 08/16/2017    Significant Diagnostic Results in last 30 days:  No results found.  Assessment/Plan #1-anticoagulation management with history of A. fib and pulmonary embolism-INR is somewhat elevated today at 3.4 will hold Coumadin tonight and recheck tomorrow notify provider of results.  Heart rate appears to be controlled on verapamil as well as Lopressor.  2.  History of suspected reoccurrence of pseudogout will restart prednisone 60 mg x 1 day 40 mg x 1 day and then 20 mg daily for 3 additional days this appears to be effective last time will continue to monitor if persists-will consider alternative therapies and obtain an  x-ray.  3.  History of hypernatremia this has been somewhat  challenging in the past at times patient would not keep an IV for fluids---staff has been encouraging fluids and recent sodiums appear to however in the 146-147 range-will update this  ---staff  -continues to encourage fluids and I did talk to her again today about drinking adequately  [id number]

## 2019-09-26 ENCOUNTER — Encounter: Payer: Self-pay | Admitting: Internal Medicine

## 2019-09-26 ENCOUNTER — Non-Acute Institutional Stay (SKILLED_NURSING_FACILITY): Payer: Medicare Other | Admitting: Internal Medicine

## 2019-09-26 DIAGNOSIS — Z7901 Long term (current) use of anticoagulants: Secondary | ICD-10-CM

## 2019-09-26 DIAGNOSIS — I4811 Longstanding persistent atrial fibrillation: Secondary | ICD-10-CM

## 2019-09-26 DIAGNOSIS — R791 Abnormal coagulation profile: Secondary | ICD-10-CM | POA: Diagnosis not present

## 2019-09-27 DIAGNOSIS — I10 Essential (primary) hypertension: Secondary | ICD-10-CM | POA: Diagnosis not present

## 2019-09-27 DIAGNOSIS — D649 Anemia, unspecified: Secondary | ICD-10-CM | POA: Diagnosis not present

## 2019-09-27 LAB — BASIC METABOLIC PANEL
BUN: 39 — AB (ref 4–21)
CO2: 22 (ref 13–22)
Chloride: 109 — AB (ref 99–108)
Creatinine: 1.1 (ref 0.5–1.1)
Glucose: 186
Potassium: 4.7 (ref 3.4–5.3)
Sodium: 144 (ref 137–147)

## 2019-09-27 LAB — COMPREHENSIVE METABOLIC PANEL
Calcium: 8.8 (ref 8.7–10.7)
GFR calc Af Amer: 53.39
GFR calc non Af Amer: 46.07

## 2019-09-29 ENCOUNTER — Non-Acute Institutional Stay (SKILLED_NURSING_FACILITY): Payer: Medicare Other | Admitting: Internal Medicine

## 2019-09-29 DIAGNOSIS — Z7901 Long term (current) use of anticoagulants: Secondary | ICD-10-CM | POA: Diagnosis not present

## 2019-09-29 DIAGNOSIS — I4811 Longstanding persistent atrial fibrillation: Secondary | ICD-10-CM

## 2019-09-29 DIAGNOSIS — Z86711 Personal history of pulmonary embolism: Secondary | ICD-10-CM

## 2019-10-02 ENCOUNTER — Encounter: Payer: Self-pay | Admitting: Internal Medicine

## 2019-10-02 ENCOUNTER — Non-Acute Institutional Stay (SKILLED_NURSING_FACILITY): Payer: Medicare Other | Admitting: Internal Medicine

## 2019-10-02 DIAGNOSIS — M1120 Other chondrocalcinosis, unspecified site: Secondary | ICD-10-CM

## 2019-10-02 DIAGNOSIS — E870 Hyperosmolality and hypernatremia: Secondary | ICD-10-CM

## 2019-10-02 DIAGNOSIS — R7309 Other abnormal glucose: Secondary | ICD-10-CM

## 2019-10-02 NOTE — Progress Notes (Signed)
Location:    [place] Room Number: 212/D Place of Service:  SNF [phone]) Provider:  Henreitta Leber, MD  Patient Care Team: [name], MD as PCP - General (Internal Medicine)  Extended Emergency Contact Information Primary Emergency Contact: [name],[name] Address: [address]          [place], [place] [address] [name] of [place] Phone: [phone] Mobile Phone: [phone] Relation: Daughter  Code Status: DNR Goals of care: Advanced Directive information Advanced Directives 10/02/2019  Does Patient Have a Medical Advance Directive? Yes  Type of Advance Directive Out of facility DNR (pink MOST or yellow form)  Does patient want to make changes to medical advance directive? No - Patient declined  Copy of [place] in Chart? -  Would patient like information on creating a medical advance directive? -  Pre-existing out of facility DNR order (yellow form or pink MOST form) Yellow form placed in chart (order not valid for inpatient use)     Chief Complaint  Patient presents with  . Follow-up    Pseudo Gout Flare   Also hypernatremia  HPI:  Pt is a 86 y.o. female seen today for an acute visit for follow-up of suspected pseudogout-as well as a history of hypernatremia.  Patient is a long-term resident of the facility-she was seen last week for some erythema and swelling most prominently of her right second finger.  She had responded to a similar presentation before with a course of prednisone and this was prescribed again she is completed it.  Today the finger appears to be much improved she says she is no longer really having pain-this was thought to be suspected pseudogout.  She also has a history of intermittent hypernatremia at times at times she will require IV fluids but sometimes she is somewhat resistant to getting an IV.  Staff has been encouraging fluids.  Her Lasix also has  been discontinued.  Last sodium on March 24 did show improvement with a sodium 144  She says she is drinking well.  Currently she is lying in bed comfortably she does not really have any complaints.     Past Medical History:  Diagnosis Date  . Acute encephalopathy   . Acute renal failure superimposed on stage 3 chronic kidney disease ([place])   . Anemia   . Atrial fibrillation ([place])   . Colon cancer ([place]) 12/11  . Colon cancer ([place]) 2011  . Diarrhea   . DVT (deep venous thrombosis) ([place]) 11/06  . Elevated brain natriuretic peptide (BNP) level   . Frequent headaches   . Heart murmur   . Hyperkalemia   . Hypertension   . Pulmonary embolism ([place]) 11/06  . Seizures ([place])   . Stroke ([place]) 11/30/2014  . Urine retention 01/2019   Past Surgical History:  Procedure Laterality Date  . APPENDECTOMY  1952  . COLECTOMY  06/2010   partial, Dr. Donne Hazel complicated by LLE CVT; S/P IVC umbrella & anemia  . HIP FRACTURE SURGERY  2006   Trauma  . TOTAL ABDOMINAL HYSTERECTOMY W/ BILATERAL SALPINGOOPHORECTOMY  1973   For Fibroids  . TOTAL HIP ARTHROPLASTY  01/03/07   Left hip replacement.  Marland Kitchen VENA CAVA FILTER PLACEMENT  06/28/2010    No Known Allergies  Outpatient Encounter Medications as of 10/02/2019  Medication Sig  . acetaminophen (TYLENOL) 500 MG tablet Take 1,000 mg by mouth every 8 (eight) hours as needed for headache (pain).   Marland Kitchen  albuterol (PROVENTIL) (2.5 MG/3ML) 0.083% nebulizer solution Take 2.5 mg by nebulization every 6 (six) hours as needed for wheezing or shortness of breath.  Marland Kitchen albuterol (VENTOLIN HFA) 108 (90 Base) MCG/ACT inhaler Inhale 2 puffs into the lungs every 6 (six) hours as needed for wheezing or shortness of breath.  Marland Kitchen aspirin 81 MG chewable tablet Chew 81 mg by mouth daily.  Marland Kitchen atorvastatin (LIPITOR) 20 MG tablet Take 1 tablet (20 mg total) by mouth daily.  . Calcium Carbonate-Vitamin D (OYSTER SHELL/VITAMIN D PO) Take 1 tablet by mouth daily. With breakfast  .  Ensure (ENSURE) Take 237 mLs by mouth 3 (three) times daily between meals.   Marland Kitchen escitalopram (LEXAPRO) 5 MG tablet Take 5 mg by mouth daily.  . isosorbide mononitrate (IMDUR) 30 MG 24 hr tablet Take 0.5 tablets (15 mg total) by mouth daily.  Marland Kitchen loratadine (CLARITIN) 10 MG tablet Take 1 tablet (10 mg total) by mouth daily as needed (seasonal allergies).  . metoprolol tartrate (LOPRESSOR) 25 MG tablet Take 1.5 tablets (37.5 mg total) by mouth 2 (two) times daily.  . mometasone-formoterol (DULERA) 100-5 MCG/ACT AERO Inhale 2 puffs into the lungs 2 (two) times daily.  . Multiple Vitamin (MULTIVITAMIN WITH MINERALS) TABS tablet Take 1 tablet by mouth daily.  . NON FORMULARY Diet order: Regular liberalized diet d/t poor intake.  . nystatin (NYSTATIN) powder Apply topically. Apply under bilateral breasts QD  . polyethylene glycol (MIRALAX / GLYCOLAX) packet Take 17 g by mouth daily as needed.  . promethazine (PHENERGAN) 12.5 MG tablet Take 1 tablet (12.5 mg total) by mouth 3 (three) times daily as needed for nausea or vomiting.  Marland Kitchen QUEtiapine (SEROQUEL) 25 MG tablet Take 2 tablets (50 mg total) by mouth at bedtime.  . tamsulosin (FLOMAX) 0.4 MG CAPS capsule Take 1 capsule (0.4 mg total) by mouth daily.  . verapamil (VERELAN PM) 120 MG 24 hr capsule Take 120 mg by mouth at bedtime.  Derrill Memo ON 10/03/2019] warfarin (COUMADIN) 2 MG tablet Give 1 tablet by mouth on Sun,Tuesday ,Thursday and Saturday  . warfarin (COUMADIN) 2.5 MG tablet Take 2.5 mg by mouth. ON MON - WED - FRI  . [DISCONTINUED] predniSONE (DELTASONE) 20 MG tablet On 09/25/2019 give 3 tablets to equal 60 mg PO Qd x 1 day. Then on 09/26/2019 give 2 tablets to equal 40 mg PO x 1 day. Then on 09/27/2019 give one tablet by mouth QD x 3 days until 09/29/2019   No facility-administered encounter medications on file as of 10/02/2019.    Review of Systems   This is somewhat limited secondary to patient being a poor historian.  General no complaints of  fever or chills.  Skin does not complain of rashes or itching she does have some chronic bruising.  Head ears eyes nose mouth and throat is not complain of visual changes or sore throat.  Respiratory does not complain of being short of breath or having cough.  Cardiac no complaints of chest pain has fairly minimal edema.  GI is not complaining of abdominal pain nausea vomiting diarrhea constipation.  GU no complaints of dysuria.  Musculoskeletal is not really complaining of joint pain says her finger pain has largely resolved.  Neurologic does not complain of dizziness headache numbness.  And psych is not really complain of being depressed or anxious-apparently will have agitation at times but this appears to be transitory  Immunization History  Administered Date(s) Administered  . Fluad Quad(high Dose 65+) 03/07/2019  .  Influenza Split 03/30/2012, 03/07/2019  . Influenza Whole 04/30/2006, 04/20/2008, 04/15/2009  . Influenza, High Dose Seasonal PF 04/20/2013, 03/27/2015, 03/31/2016, 04/22/2017, 04/20/2018  . Influenza,inj,Quad PF,6+ Mos 05/23/2014  . Moderna SARS-COVID-2 Vaccination 07/06/2019, 08/03/2019  . Pneumococcal Conjugate-13 04/24/2015  . Pneumococcal Polysaccharide-23 07/06/2009  . Td 10/30/2016   Pertinent  Health Maintenance Due  Topic Date Due  . DEXA SCAN  02/27/2020 (Originally 11/26/1993)  . INFLUENZA VACCINE  Completed  . PNA vac Low Risk Adult  Completed   Fall Risk  07/22/2018 01/18/2018 10/30/2016 08/14/2015 04/24/2015  Falls in the past year? 1 No No No No  Number falls in past yr: 1 - - - -  Injury with Fall? 0 - - - -   Functional Status Survey:    Vitals:   10/02/19 1356  BP: 111/71  Pulse: 62  Resp: 18  Temp: (!) 97.2 F (36.2 C)  TempSrc: Oral  SpO2: 96%  Weight: 155 lb 1.6 oz (70.4 kg)  Height: 5\' 3"  (1.6 m)   Body mass index is 27.47 kg/m. Physical Exam   In general this is a pleasant elderly female in no distress lying comfortably  in bed.  Her skin is warm and dry she does have some bruising most prominently left hand this appears to be somewhat old.  Eyes visual acuity appears to be intact sclera and conjunctive are clear.  Oropharynx is clear mucous membranes moist.  Chest is clear to auscultation there is no labored breathing.  Heart is irregular irregular rate and rhythm with a baseline systolic murmur she has minimal lower extremity edema on the right.  Abdomen is soft nontender with active bowel sounds.  Musculoskeletal appears able to move her extremities x4 of the right hand does not really show evidence of any increased erythema or swelling she does have arthritic changes.  There is no significant tenderness palpation of her right hand.  Neurologic appears grossly intact her speech is clear cannot appreciate lateralizing findings.  Psych she is pleasant and appropriate  Labs reviewed:  September 27, 2019.  Sodium one forty-four potassium 4.7 BUN thirty-nine creatinine 1.06.   Recent Labs    01/22/19 0405 01/22/19 0405 01/24/19 0432 03/07/19 1454 04/08/19 0021 04/09/19 0456 04/11/19 0556 04/11/19 0556 04/12/19 0536 04/12/19 0536 04/14/19 0532 05/18/19 0000 09/07/19 0000 09/11/19 0000 09/18/19 0000  NA 142   < > 144   < > 144   < > 148*   < > 147*   < > 145   < > 146 147 147  K 3.2*   < > 4.7   < > 3.5   < > 4.3   < > 4.1   < > 3.8   < > 4.5 4.0 4.1  CL 105   < > 112*   < > 107   < > 111   < > 108   < > 109   < > 112* 112* 112*  CO2 27   < > 24   < > 27   < > 27   < > 25   < > 24   < > 26* 23* 23*  GLUCOSE 92   < > 107*   < > 114*   < > 87  --  93  --  103*  --   --   --   --   BUN 24*   < > 25*   < > 41*   < > 31*   < >  29*   < > 26*   < > 27* 19 25*  CREATININE 1.41*   < > 1.33*   < > 1.96*   < > 1.28*   < > 1.26*   < > 1.13*   < > 1.0 1.0 1.0  CALCIUM 8.5*   < > 9.4   < > 9.2   < > 9.0   < > 9.3   < > 9.4   < > 9.0 9.1 9.2  MG 2.1  --  2.0  --  2.2  --   --   --   --   --   --   --    --   --   --   PHOS 3.4  --   --   --  4.0  --   --   --   --   --   --   --   --   --   --    < > = values in this interval not displayed.   Recent Labs    01/20/19 0918 03/07/19 1454 04/07/19 1530  AST 25 17 31   ALT 15 10 15   ALKPHOS 41 43 56  BILITOT 0.7 0.8 1.4*  PROT 5.9* 6.6 6.9  ALBUMIN 3.8 4.1 4.7   Recent Labs    04/07/19 1530 04/07/19 1530 04/08/19 0021 04/08/19 0021 04/12/19 0536 05/18/19 0000 07/14/19 0000 08/11/19 0000 09/07/19 0000  WBC 8.2   < > 6.9   < > 5.3   < > 3.6 5.2 3.4  NEUTROABS 6.5  --   --   --   --    < > 2 4 2   HGB 13.3   < > 12.7   < > 12.9   < > 12.4 12.4 11.9*  HCT 41.2   < > 40.0   < > 42.0   < > 37 37 36  MCV 95.2  --  97.6  --  100.0  --   --   --   --   PLT 75*   < > 76*   < > 70*   < > 60* 69* 63*   < > = values in this interval not displayed.   Lab Results  Component Value Date   TSH 3.58 08/22/2018   Lab Results  Component Value Date   HGBA1C 5.3 09/07/2019   Lab Results  Component Value Date   CHOL 97 08/16/2017   HDL 37.70 (L) 08/16/2017   LDLCALC 48 08/16/2017   TRIG 56.0 08/16/2017   CHOLHDL 3 08/16/2017    Significant Diagnostic Results in last 30 days:  No results found.  Assessment/Plan  #1 suspected pseudogout-this appears to once again have responded to a course of prednisone-this appears resolved at this point with no evidence of pain erythema or edema-at this point will continue to monitor.  2.  History of hypernatremia intermittent-this appears to have stabilized relatively speaking with a sodium of 144staff is encouraging fluids-will update this next week  #3-I do note on metabolic panel recently her glucosewas 186 which is above what she usually runs which is around one hundred-will check CBGs three times a week to keep an eye on this.  I do note recent hemoglobin A1c earlier this month was 5.3  [id number]

## 2019-10-03 ENCOUNTER — Encounter: Payer: Self-pay | Admitting: Internal Medicine

## 2019-10-03 DIAGNOSIS — D649 Anemia, unspecified: Secondary | ICD-10-CM | POA: Diagnosis not present

## 2019-10-03 DIAGNOSIS — I10 Essential (primary) hypertension: Secondary | ICD-10-CM | POA: Diagnosis not present

## 2019-10-03 LAB — BASIC METABOLIC PANEL: Glucose: 108

## 2019-10-03 NOTE — Progress Notes (Signed)
Location:   [facility] of Service:   SNF  [name], Noah Delaine, MD  Patient Care Team: [name], MD as PCP - General (Internal Medicine)  Extended Emergency Contact Information Primary Emergency Contact: [facility] Address: [address]          [place], [place] [address] [name] of [place] Phone: [phone] Mobile Phone: [phone] Relation: Daughter    Allergies: Patient has no known allergies.  Chief Complaint  Patient presents with  . Acute Visit    HPI: Patient is 86 y.o. female who is being seen for an INR check.  Today patient's INR is 3.5 having gone from 2.8-4 with addition of 0.5 mg of Coumadin extra per week.  Patient has no complaints.  Past Medical History:  Diagnosis Date  . Acute encephalopathy   . Acute renal failure superimposed on stage 3 chronic kidney disease ([place])   . Anemia   . Atrial fibrillation ([place])   . Colon cancer ([place]) 12/11  . Colon cancer ([place]) 2011  . Diarrhea   . DVT (deep venous thrombosis) ([place]) 11/06  . Elevated brain natriuretic peptide (BNP) level   . Frequent headaches   . Heart murmur   . Hyperkalemia   . Hypertension   . Pulmonary embolism ([place]) 11/06  . Seizures ([place])   . Stroke ([place]) 11/30/2014  . Urine retention 01/2019    Past Surgical History:  Procedure Laterality Date  . APPENDECTOMY  1952  . COLECTOMY  06/2010   partial, Dr. Donne Hazel complicated by LLE CVT; S/P IVC umbrella & anemia  . HIP FRACTURE SURGERY  2006   Trauma  . TOTAL ABDOMINAL HYSTERECTOMY W/ BILATERAL SALPINGOOPHORECTOMY  1973   For Fibroids  . TOTAL HIP ARTHROPLASTY  01/03/07   Left hip replacement.  Marland Kitchen VENA CAVA FILTER PLACEMENT  06/28/2010    Allergies as of 09/26/2019   No Known Allergies     Medication List       Accurate as of September 26, 2019 11:59 PM. If you have any questions, ask your nurse or doctor.        acetaminophen 500 MG tablet Commonly known as: TYLENOL Take 1,000 mg by mouth  every 8 (eight) hours as needed for headache (pain).   albuterol (2.5 MG/3ML) 0.083% nebulizer solution Commonly known as: PROVENTIL Take 2.5 mg by nebulization every 6 (six) hours as needed for wheezing or shortness of breath.   albuterol 108 (90 Base) MCG/ACT inhaler Commonly known as: VENTOLIN HFA Inhale 2 puffs into the lungs every 6 (six) hours as needed for wheezing or shortness of breath.   aspirin 81 MG chewable tablet Chew 81 mg by mouth daily.   atorvastatin 20 MG tablet Commonly known as: LIPITOR Take 1 tablet (20 mg total) by mouth daily.   Dulera 100-5 MCG/ACT Aero Generic drug: mometasone-formoterol Inhale 2 puffs into the lungs 2 (two) times daily.   Ensure Take 237 mLs by mouth 3 (three) times daily between meals.   escitalopram 5 MG tablet Commonly known as: LEXAPRO Take 5 mg by mouth daily.   isosorbide mononitrate 30 MG 24 hr tablet Commonly known as: IMDUR Take 0.5 tablets (15 mg total) by mouth daily.   loratadine 10 MG tablet Commonly known as: CLARITIN Take 1 tablet (10 mg total) by mouth daily as needed (seasonal allergies).   metoprolol tartrate 25 MG tablet Commonly known as: LOPRESSOR Take 1.5 tablets (37.5 mg total) by mouth 2 (two) times daily.  multivitamin with minerals Tabs tablet Take 1 tablet by mouth daily.   NON FORMULARY Diet order: Regular liberalized diet d/t poor intake.   nystatin powder Generic drug: nystatin Apply topically. Apply under bilateral breasts QD   OYSTER SHELL/VITAMIN D PO Take 1 tablet by mouth daily. With breakfast   polyethylene glycol 17 g packet Commonly known as: MIRALAX / GLYCOLAX Take 17 g by mouth daily as needed.   predniSONE 20 MG tablet Commonly known as: DELTASONE On 09/25/2019 give 3 tablets to equal 60 mg PO Qd x 1 day. Then on 09/26/2019 give 2 tablets to equal 40 mg PO x 1 day. Then on 09/27/2019 give one tablet by mouth QD x 3 days until 09/29/2019   promethazine 12.5 MG tablet Commonly  known as: PHENERGAN Take 1 tablet (12.5 mg total) by mouth 3 (three) times daily as needed for nausea or vomiting.   QUEtiapine 25 MG tablet Commonly known as: SEROQUEL Take 2 tablets (50 mg total) by mouth at bedtime.   tamsulosin 0.4 MG Caps capsule Commonly known as: FLOMAX Take 1 capsule (0.4 mg total) by mouth daily.   verapamil 120 MG 24 hr capsule Commonly known as: VERELAN PM Take 120 mg by mouth at bedtime.       No orders of the defined types were placed in this encounter.   Immunization History  Administered Date(s) Administered  . Fluad Quad(high Dose 65+) 03/07/2019  . Influenza Split 03/30/2012, 03/07/2019  . Influenza Whole 04/30/2006, 04/20/2008, 04/15/2009  . Influenza, High Dose Seasonal PF 04/20/2013, 03/27/2015, 03/31/2016, 04/22/2017, 04/20/2018  . Influenza,inj,Quad PF,6+ Mos 05/23/2014  . Moderna SARS-COVID-2 Vaccination 07/06/2019, 08/03/2019  . Pneumococcal Conjugate-13 04/24/2015  . Pneumococcal Polysaccharide-23 07/06/2009  . Td 10/30/2016    Social History   Tobacco Use  . Smoking status: Passive Smoke Exposure - Never Smoker  . Smokeless tobacco: Never Used  . Tobacco comment: husband smoked in home.   Substance Use Topics  . Alcohol use: No    Alcohol/week: 0.0 standard drinks     Vitals:   10/03/19 0954  BP: (!) 110/50  Pulse: (!) 56  Resp: 17  Temp: (!) 97.5 F (36.4 C)   Body mass index is 27.63 kg/m.   Patient Active Problem List   Diagnosis Date Noted  . Altered behavior 05/10/2019  . Klebsiella cystitis 04/22/2019  . Metabolic encephalopathy [id number]  . Back pain 04/08/2019  . MCI (mild cognitive impairment) 03/07/2019  . Congestive heart failure (CHF) ([place]) 01/20/2019  . CAD (coronary artery disease) 06/07/2018  . Pulmonary hypertension, primary ([place]) 06/07/2018  . Generalized weakness   . Abnormal LFTs 06/01/2018  . Hallucination 06/01/2018  . Abnormal chest x-ray 09/28/2015  . Edema of right lower  extremity 09/28/2015  . Elevated brain natriuretic peptide (BNP) level 09/17/2015  . Seizures ([place]) 09/16/2015  . Asthma 08/14/2015  . PCP NOTES >>>>>>>>>>>>>>>>>>>>>>>>>>>>>> 04/24/2015  . TIA (transient ischemic attack) 12/11/2014  . Dyslipidemia 12/11/2014  . History of CVA (cerebrovascular accident) 12/01/2014  . Thrombocytopenia ([place]) 12/01/2014  . Chronic renal insufficiency, stage II (mild) 11/12/2013  . Impingement syndrome, shoulder, left 11/09/2013  . Acquired short bowel syndrome 11/09/2013  . Annual physical exam 08/30/2013  . Hx pulmonary embolism 06/27/2013  . Warfarin anticoagulation 06/27/2013  . DDD (degenerative disc disease), lumbar 11/18/2012  . ATRIAL FIBRILLATION   07/08/2010  . COLON CANCER, HX OF 07/08/2010  . DIZZINESS 10/18/2009  . Essential hypertension 12/16/2007  . HIP PAIN, CHRONIC 12/28/2006  . ANEMIA-NOS 07/15/2006  .  Personal history of venous thrombosis and embolism 07/15/2006    CMP     Component Value Date/Time   NA 147 09/18/2019 0000   NA 142 06/09/2012 1509   K 4.1 09/18/2019 0000   K 3.9 06/09/2012 1509   CL 112 (A) 09/18/2019 0000   CL 108 (H) 06/09/2012 1509   CO2 23 (A) 09/18/2019 0000   CO2 26 06/09/2012 1509   GLUCOSE 103 (H) 04/14/2019 0532   GLUCOSE 103 (H) 06/09/2012 1509   BUN 25 (A) 09/18/2019 0000   BUN 17.0 06/09/2012 1509   CREATININE 1.0 09/18/2019 0000   CREATININE 1.13 (H) 04/14/2019 0532   CREATININE 1.51 (H) 07/22/2018 1624   CREATININE 1.2 (H) 06/09/2012 1509   CALCIUM 9.2 09/18/2019 0000   CALCIUM 9.5 06/09/2012 1509   PROT 6.9 04/07/2019 1530   PROT 6.9 01/13/2018 1620   PROT 7.2 06/09/2012 1509   ALBUMIN 4.7 04/07/2019 1530   ALBUMIN 4.6 01/13/2018 1620   ALBUMIN 4.2 06/09/2012 1509   AST 31 04/07/2019 1530   AST 17 06/09/2012 1509   ALT 15 04/07/2019 1530   ALT 11 06/09/2012 1509   ALKPHOS 56 04/07/2019 1530   ALKPHOS 47 06/09/2012 1509   BILITOT 1.4 (H) 04/07/2019 1530   BILITOT 1.1 01/13/2018  1620   BILITOT 0.73 06/09/2012 1509   GFRNONAA 50.66 09/18/2019 0000   GFRAA 58.71 09/18/2019 0000   Recent Labs    01/22/19 0405 01/22/19 0405 01/24/19 0432 03/07/19 1454 04/08/19 0021 04/09/19 0456 04/11/19 0556 04/11/19 0556 04/12/19 0536 04/12/19 0536 04/14/19 0532 05/18/19 0000 09/07/19 0000 09/11/19 0000 09/18/19 0000  NA 142   < > 144   < > 144   < > 148*   < > 147*   < > 145   < > 146 147 147  K 3.2*   < > 4.7   < > 3.5   < > 4.3   < > 4.1   < > 3.8   < > 4.5 4.0 4.1  CL 105   < > 112*   < > 107   < > 111   < > 108   < > 109   < > 112* 112* 112*  CO2 27   < > 24   < > 27   < > 27   < > 25   < > 24   < > 26* 23* 23*  GLUCOSE 92   < > 107*   < > 114*   < > 87  --  93  --  103*  --   --   --   --   BUN 24*   < > 25*   < > 41*   < > 31*   < > 29*   < > 26*   < > 27* 19 25*  CREATININE 1.41*   < > 1.33*   < > 1.96*   < > 1.28*   < > 1.26*   < > 1.13*   < > 1.0 1.0 1.0  CALCIUM 8.5*   < > 9.4   < > 9.2   < > 9.0   < > 9.3   < > 9.4   < > 9.0 9.1 9.2  MG 2.1  --  2.0  --  2.2  --   --   --   --   --   --   --   --   --   --  PHOS 3.4  --   --   --  4.0  --   --   --   --   --   --   --   --   --   --    < > = values in this interval not displayed.   Recent Labs    01/20/19 0918 03/07/19 1454 04/07/19 1530  AST 25 17 31   ALT 15 10 15   ALKPHOS 41 43 56  BILITOT 0.7 0.8 1.4*  PROT 5.9* 6.6 6.9  ALBUMIN 3.8 4.1 4.7   Recent Labs    04/07/19 1530 04/07/19 1530 04/08/19 0021 04/08/19 0021 04/12/19 0536 05/18/19 0000 07/14/19 0000 08/11/19 0000 09/07/19 0000  WBC 8.2   < > 6.9   < > 5.3   < > 3.6 5.2 3.4  NEUTROABS 6.5  --   --   --   --    < > 2 4 2   HGB 13.3   < > 12.7   < > 12.9   < > 12.4 12.4 11.9*  HCT 41.2   < > 40.0   < > 42.0   < > 37 37 36  MCV 95.2  --  97.6  --  100.0  --   --   --   --   PLT 75*   < > 76*   < > 70*   < > 60* 69* 63*   < > = values in this interval not displayed.   No results for input(s): CHOL, LDLCALC, TRIG in the last 8760  hours.  Invalid input(s): HCL No results found for: [facility] Lab Results  Component Value Date   TSH 3.58 08/22/2018   Lab Results  Component Value Date   HGBA1C 5.3 09/07/2019   Lab Results  Component Value Date   CHOL 97 08/16/2017   HDL 37.70 (L) 08/16/2017   LDLCALC 48 08/16/2017   TRIG 56.0 08/16/2017   CHOLHDL 3 08/16/2017    Significant Diagnostic Results in last 30 days:  No results found.  Assessment and Plan  Atrial fibrillation/anticoagulated on Coumadin/hyper anticoagulated-patient's INR is not stable.  I decided to put her on Eliquis 2.5 mg twice daily once her INR is less than 2.  Check INR daily until less than 2.    [name] , MD

## 2019-10-08 ENCOUNTER — Encounter: Payer: Self-pay | Admitting: Internal Medicine

## 2019-10-08 NOTE — Progress Notes (Signed)
Location:      Place of Service:     [name], MD  Patient Care Team: [name], MD as PCP - General (Internal Medicine)  Extended Emergency Contact Information Primary Emergency Contact: [facility] Address: [address]          [place], [place] [address] [name] of [place] Phone: [phone] Mobile Phone: [phone] Relation: Daughter    Allergies: Patient has no known allergies.  No chief complaint on file.   HPI: Patient is 86 y.o. female who   Past Medical History:  Diagnosis Date  . Acute encephalopathy   . Acute renal failure superimposed on stage 3 chronic kidney disease ([place])   . Anemia   . Atrial fibrillation ([place])   . Colon cancer ([place]) 12/11  . Colon cancer ([place]) 2011  . Diarrhea   . DVT (deep venous thrombosis) ([place]) 11/06  . Elevated brain natriuretic peptide (BNP) level   . Frequent headaches   . Heart murmur   . Hyperkalemia   . Hypertension   . Pulmonary embolism ([place]) 11/06  . Seizures ([place])   . Stroke ([place]) 11/30/2014  . Urine retention 01/2019    Past Surgical History:  Procedure Laterality Date  . APPENDECTOMY  1952  . COLECTOMY  06/2010   partial, Dr. Donne Hazel complicated by LLE CVT; S/P IVC umbrella & anemia  . HIP FRACTURE SURGERY  2006   Trauma  . TOTAL ABDOMINAL HYSTERECTOMY W/ BILATERAL SALPINGOOPHORECTOMY  1973   For Fibroids  . TOTAL HIP ARTHROPLASTY  01/03/07   Left hip replacement.  Marland Kitchen VENA CAVA FILTER PLACEMENT  06/28/2010    Allergies as of 09/29/2019   No Known Allergies     Medication List       Accurate as of September 29, 2019 11:59 PM. If you have any questions, ask your nurse or doctor.        acetaminophen 500 MG tablet Commonly known as: TYLENOL Take 1,000 mg by mouth every 8 (eight) hours as needed for headache (pain).   albuterol (2.5 MG/3ML) 0.083% nebulizer solution Commonly known as: PROVENTIL Take 2.5 mg by nebulization every 6 (six) hours as needed for  wheezing or shortness of breath.   albuterol 108 (90 Base) MCG/ACT inhaler Commonly known as: VENTOLIN HFA Inhale 2 puffs into the lungs every 6 (six) hours as needed for wheezing or shortness of breath.   aspirin 81 MG chewable tablet Chew 81 mg by mouth daily.   atorvastatin 20 MG tablet Commonly known as: LIPITOR Take 1 tablet (20 mg total) by mouth daily.   Dulera 100-5 MCG/ACT Aero Generic drug: mometasone-formoterol Inhale 2 puffs into the lungs 2 (two) times daily.   Ensure Take 237 mLs by mouth 3 (three) times daily between meals.   escitalopram 5 MG tablet Commonly known as: LEXAPRO Take 5 mg by mouth daily.   isosorbide mononitrate 30 MG 24 hr tablet Commonly known as: IMDUR Take 0.5 tablets (15 mg total) by mouth daily.   loratadine 10 MG tablet Commonly known as: CLARITIN Take 1 tablet (10 mg total) by mouth daily as needed (seasonal allergies).   metoprolol tartrate 25 MG tablet Commonly known as: LOPRESSOR Take 1.5 tablets (37.5 mg total) by mouth 2 (two) times daily.   multivitamin with minerals Tabs tablet Take 1 tablet by mouth daily.   NON FORMULARY Diet order: Regular liberalized diet d/t poor intake.   nystatin powder Generic drug: nystatin Apply topically. Apply under bilateral breasts QD  OYSTER SHELL/VITAMIN D PO Take 1 tablet by mouth daily. With breakfast   polyethylene glycol 17 g packet Commonly known as: MIRALAX / GLYCOLAX Take 17 g by mouth daily as needed.   predniSONE 20 MG tablet Commonly known as: DELTASONE On 09/25/2019 give 3 tablets to equal 60 mg PO Qd x 1 day. Then on 09/26/2019 give 2 tablets to equal 40 mg PO x 1 day. Then on 09/27/2019 give one tablet by mouth QD x 3 days until 09/29/2019   promethazine 12.5 MG tablet Commonly known as: PHENERGAN Take 1 tablet (12.5 mg total) by mouth 3 (three) times daily as needed for nausea or vomiting.   QUEtiapine 25 MG tablet Commonly known as: SEROQUEL Take 2 tablets (50 mg  total) by mouth at bedtime.   tamsulosin 0.4 MG Caps capsule Commonly known as: FLOMAX Take 1 capsule (0.4 mg total) by mouth daily.   verapamil 120 MG 24 hr capsule Commonly known as: VERELAN PM Take 120 mg by mouth at bedtime.       No orders of the defined types were placed in this encounter.   Immunization History  Administered Date(s) Administered  . Fluad Quad(high Dose 65+) 03/07/2019  . Influenza Split 03/30/2012, 03/07/2019  . Influenza Whole 04/30/2006, 04/20/2008, 04/15/2009  . Influenza, High Dose Seasonal PF 04/20/2013, 03/27/2015, 03/31/2016, 04/22/2017, 04/20/2018  . Influenza,inj,Quad PF,6+ Mos 05/23/2014  . Moderna SARS-COVID-2 Vaccination 07/06/2019, 08/03/2019  . Pneumococcal Conjugate-13 04/24/2015  . Pneumococcal Polysaccharide-23 07/06/2009  . Td 10/30/2016    Social History   Tobacco Use  . Smoking status: Passive Smoke Exposure - Never Smoker  . Smokeless tobacco: Never Used  . Tobacco comment: husband smoked in home.   Substance Use Topics  . Alcohol use: No    Alcohol/week: 0.0 standard drinks     There were no vitals filed for this visit. There is no height or weight on file to calculate BMI.   Patient Active Problem List   Diagnosis Date Noted  . Altered behavior 05/10/2019  . Klebsiella cystitis 04/22/2019  . Metabolic encephalopathy [id number]  . Back pain 04/08/2019  . MCI (mild cognitive impairment) 03/07/2019  . Congestive heart failure (CHF) ([place]) 01/20/2019  . CAD (coronary artery disease) 06/07/2018  . Pulmonary hypertension, primary ([place]) 06/07/2018  . Generalized weakness   . Abnormal LFTs 06/01/2018  . Hallucination 06/01/2018  . Abnormal chest x-ray 09/28/2015  . Edema of right lower extremity 09/28/2015  . Elevated brain natriuretic peptide (BNP) level 09/17/2015  . Seizures ([place]) 09/16/2015  . Supratherapeutic INR 08/30/2015  . Asthma 08/14/2015  . PCP NOTES >>>>>>>>>>>>>>>>>>>>>>>>>>>>>> 04/24/2015  . TIA  (transient ischemic attack) 12/11/2014  . Dyslipidemia 12/11/2014  . History of CVA (cerebrovascular accident) 12/01/2014  . Thrombocytopenia ([place]) 12/01/2014  . Chronic renal insufficiency, stage II (mild) 11/12/2013  . Impingement syndrome, shoulder, left 11/09/2013  . Acquired short bowel syndrome 11/09/2013  . Annual physical exam 08/30/2013  . Hx pulmonary embolism 06/27/2013  . Warfarin anticoagulation 06/27/2013  . DDD (degenerative disc disease), lumbar 11/18/2012  . ATRIAL FIBRILLATION   07/08/2010  . COLON CANCER, HX OF 07/08/2010  . DIZZINESS 10/18/2009  . Essential hypertension 12/16/2007  . HIP PAIN, CHRONIC 12/28/2006  . ANEMIA-NOS 07/15/2006  . Personal history of venous thrombosis and embolism 07/15/2006    CMP     Component Value Date/Time   NA 147 09/18/2019 0000   NA 142 06/09/2012 1509   K 4.1 09/18/2019 0000   K 3.9 06/09/2012 1509  CL 112 (A) 09/18/2019 0000   CL 108 (H) 06/09/2012 1509   CO2 23 (A) 09/18/2019 0000   CO2 26 06/09/2012 1509   GLUCOSE 103 (H) 04/14/2019 0532   GLUCOSE 103 (H) 06/09/2012 1509   BUN 25 (A) 09/18/2019 0000   BUN 17.0 06/09/2012 1509   CREATININE 1.0 09/18/2019 0000   CREATININE 1.13 (H) 04/14/2019 0532   CREATININE 1.51 (H) 07/22/2018 1624   CREATININE 1.2 (H) 06/09/2012 1509   CALCIUM 9.2 09/18/2019 0000   CALCIUM 9.5 06/09/2012 1509   PROT 6.9 04/07/2019 1530   PROT 6.9 01/13/2018 1620   PROT 7.2 06/09/2012 1509   ALBUMIN 4.7 04/07/2019 1530   ALBUMIN 4.6 01/13/2018 1620   ALBUMIN 4.2 06/09/2012 1509   AST 31 04/07/2019 1530   AST 17 06/09/2012 1509   ALT 15 04/07/2019 1530   ALT 11 06/09/2012 1509   ALKPHOS 56 04/07/2019 1530   ALKPHOS 47 06/09/2012 1509   BILITOT 1.4 (H) 04/07/2019 1530   BILITOT 1.1 01/13/2018 1620   BILITOT 0.73 06/09/2012 1509   GFRNONAA 50.66 09/18/2019 0000   GFRAA 58.71 09/18/2019 0000   Recent Labs    01/22/19 0405 01/22/19 0405 01/24/19 0432 03/07/19 1454 04/08/19 0021  04/09/19 0456 04/11/19 0556 04/11/19 0556 04/12/19 0536 04/12/19 0536 04/14/19 0532 05/18/19 0000 09/07/19 0000 09/11/19 0000 09/18/19 0000  NA 142   < > 144   < > 144   < > 148*   < > 147*   < > 145   < > 146 147 147  K 3.2*   < > 4.7   < > 3.5   < > 4.3   < > 4.1   < > 3.8   < > 4.5 4.0 4.1  CL 105   < > 112*   < > 107   < > 111   < > 108   < > 109   < > 112* 112* 112*  CO2 27   < > 24   < > 27   < > 27   < > 25   < > 24   < > 26* 23* 23*  GLUCOSE 92   < > 107*   < > 114*   < > 87  --  93  --  103*  --   --   --   --   BUN 24*   < > 25*   < > 41*   < > 31*   < > 29*   < > 26*   < > 27* 19 25*  CREATININE 1.41*   < > 1.33*   < > 1.96*   < > 1.28*   < > 1.26*   < > 1.13*   < > 1.0 1.0 1.0  CALCIUM 8.5*   < > 9.4   < > 9.2   < > 9.0   < > 9.3   < > 9.4   < > 9.0 9.1 9.2  MG 2.1  --  2.0  --  2.2  --   --   --   --   --   --   --   --   --   --   PHOS 3.4  --   --   --  4.0  --   --   --   --   --   --   --   --   --   --    < > =  values in this interval not displayed.   Recent Labs    01/20/19 0918 03/07/19 1454 04/07/19 1530  AST 25 17 31   ALT 15 10 15   ALKPHOS 41 43 56  BILITOT 0.7 0.8 1.4*  PROT 5.9* 6.6 6.9  ALBUMIN 3.8 4.1 4.7   Recent Labs    04/07/19 1530 04/07/19 1530 04/08/19 0021 04/08/19 0021 04/12/19 0536 05/18/19 0000 07/14/19 0000 08/11/19 0000 09/07/19 0000  WBC 8.2   < > 6.9   < > 5.3   < > 3.6 5.2 3.4  NEUTROABS 6.5  --   --   --   --    < > 2 4 2   HGB 13.3   < > 12.7   < > 12.9   < > 12.4 12.4 11.9*  HCT 41.2   < > 40.0   < > 42.0   < > 37 37 36  MCV 95.2  --  97.6  --  100.0  --   --   --   --   PLT 75*   < > 76*   < > 70*   < > 60* 69* 63*   < > = values in this interval not displayed.   No results for input(s): CHOL, LDLCALC, TRIG in the last 8760 hours.  Invalid input(s): HCL No results found for: [facility] Lab Results  Component Value Date   TSH 3.58 08/22/2018   Lab Results  Component Value Date   HGBA1C 5.3 09/07/2019   Lab  Results  Component Value Date   CHOL 97 08/16/2017   HDL 37.70 (L) 08/16/2017   LDLCALC 48 08/16/2017   TRIG 56.0 08/16/2017   CHOLHDL 3 08/16/2017    Significant Diagnostic Results in last 30 days:  No results found.  Assessment and Plan  Atrial fibrillation/anticoagulated on Coumadin-patient's INR is being held because it went out.  Check INR daily until less than or equal to 2.6.  At that time start Coumadin 2.5 mg Monday Wednesday Friday and 2 mg every other day of the week.  INR 1 week after Coumadin is restarted     [name] , MD

## 2019-10-10 DIAGNOSIS — D649 Anemia, unspecified: Secondary | ICD-10-CM | POA: Diagnosis not present

## 2019-10-10 DIAGNOSIS — I10 Essential (primary) hypertension: Secondary | ICD-10-CM | POA: Diagnosis not present

## 2019-10-10 LAB — COMPREHENSIVE METABOLIC PANEL
Calcium: 8.9 (ref 8.7–10.7)
GFR calc Af Amer: 52.18
GFR calc non Af Amer: 45.03

## 2019-10-10 LAB — BASIC METABOLIC PANEL
BUN: 30 — AB (ref 4–21)
CO2: 20 (ref 13–22)
Chloride: 111 — AB (ref 99–108)
Creatinine: 1.1 (ref 0.5–1.1)
Glucose: 90
Potassium: 4.5 (ref 3.4–5.3)
Sodium: 146 (ref 137–147)

## 2019-10-11 DIAGNOSIS — F330 Major depressive disorder, recurrent, mild: Secondary | ICD-10-CM | POA: Diagnosis not present

## 2019-10-11 DIAGNOSIS — F5111 Primary hypersomnia: Secondary | ICD-10-CM | POA: Diagnosis not present

## 2019-10-13 ENCOUNTER — Non-Acute Institutional Stay (SKILLED_NURSING_FACILITY): Payer: Medicare Other | Admitting: Internal Medicine

## 2019-10-13 ENCOUNTER — Encounter: Payer: Self-pay | Admitting: Internal Medicine

## 2019-10-13 DIAGNOSIS — I4811 Longstanding persistent atrial fibrillation: Secondary | ICD-10-CM

## 2019-10-13 DIAGNOSIS — Z7901 Long term (current) use of anticoagulants: Secondary | ICD-10-CM | POA: Diagnosis not present

## 2019-10-13 NOTE — Progress Notes (Signed)
Location:  [place] Room Number: 211-D Place of Service:  SNF (31)  [name], MD  Patient Care Team: [name], MD as PCP - General (Internal Medicine)  Extended Emergency Contact Information Primary Emergency Contact: [facility] Address: [address]          [place], [place] [address] [name] of [place] Phone: [phone] Mobile Phone: [phone] Relation: Daughter    Allergies: Patient has no known allergies.  Chief Complaint  Patient presents with  . Anticoagulation    "INR" Anticoagulation Visit.    HPI: Patient is 86 y.o. female with atrial fibrillation and chronic Coumadin use is being seen today for an INR check.  Today's INR is 3.0 which is therapeutic.  Past Medical History:  Diagnosis Date  . Acute encephalopathy   . Acute renal failure superimposed on stage 3 chronic kidney disease ([place])   . Anemia   . Atrial fibrillation ([place])   . Colon cancer ([place]) 12/11  . Colon cancer ([place]) 2011  . Diarrhea   . DVT (deep venous thrombosis) ([place]) 11/06  . Elevated brain natriuretic peptide (BNP) level   . Frequent headaches   . Heart murmur   . Hyperkalemia   . Hypertension   . Pulmonary embolism ([place]) 11/06  . Seizures ([place])   . Stroke ([place]) 11/30/2014  . Urine retention 01/2019    Past Surgical History:  Procedure Laterality Date  . APPENDECTOMY  1952  . COLECTOMY  06/2010   partial, Dr. Donne Hazel complicated by LLE CVT; S/P IVC umbrella & anemia  . HIP FRACTURE SURGERY  2006   Trauma  . TOTAL ABDOMINAL HYSTERECTOMY W/ BILATERAL SALPINGOOPHORECTOMY  1973   For Fibroids  . TOTAL HIP ARTHROPLASTY  01/03/07   Left hip replacement.  Marland Kitchen VENA CAVA FILTER PLACEMENT  06/28/2010    Allergies as of 10/13/2019   No Known Allergies     Medication List       Accurate as of October 13, 2019 11:59 PM. If you have any questions, ask your nurse or doctor.        STOP taking these medications     Dulera 100-5 MCG/ACT Aero Generic drug: mometasone-formoterol Stopped by: Inocencio Homes, MD   Ensure Stopped by: Inocencio Homes, MD   isosorbide mononitrate 30 MG 24 hr tablet Commonly known as: IMDUR Stopped by: Inocencio Homes, MD   NON FORMULARY Stopped by: Inocencio Homes, MD   nystatin powder Generic drug: nystatin Stopped by: Inocencio Homes, MD   promethazine 12.5 MG tablet Commonly known as: PHENERGAN Stopped by: Inocencio Homes, MD     TAKE these medications   acetaminophen 500 MG tablet Commonly known as: TYLENOL Take 1,000 mg by mouth every 8 (eight) hours as needed for headache (pain).   albuterol (2.5 MG/3ML) 0.083% nebulizer solution Commonly known as: PROVENTIL Take 2.5 mg by nebulization every 6 (six) hours as needed for wheezing or shortness of breath.   albuterol 108 (90 Base) MCG/ACT inhaler Commonly known as: VENTOLIN HFA Inhale 2 puffs into the lungs every 6 (six) hours as needed for wheezing or shortness of breath.   aspirin 81 MG chewable tablet Chew 81 mg by mouth daily.   atorvastatin 20 MG tablet Commonly known as: LIPITOR Take 1 tablet (20 mg total) by mouth daily.   escitalopram 5 MG tablet Commonly known as: LEXAPRO Take 5 mg by mouth daily.   loratadine 10 MG tablet Commonly known as: CLARITIN Take 1 tablet (  10 mg total) by mouth daily as needed (seasonal allergies).   metoprolol tartrate 25 MG tablet Commonly known as: LOPRESSOR Take 1.5 tablets (37.5 mg total) by mouth 2 (two) times daily.   multivitamin with minerals Tabs tablet Take 1 tablet by mouth daily.   OYSTER SHELL/VITAMIN D PO Take 1 tablet by mouth daily. With breakfast   polyethylene glycol 17 g packet Commonly known as: MIRALAX / GLYCOLAX Take 17 g by mouth daily as needed.   QUEtiapine 25 MG tablet Commonly known as: SEROQUEL Take 2 tablets (50 mg total) by mouth at bedtime.   tamsulosin 0.4 MG Caps capsule Commonly known as: FLOMAX Take 1 capsule (0.4  mg total) by mouth daily.   verapamil 120 MG 24 hr capsule Commonly known as: VERELAN PM Take 120 mg by mouth at bedtime.   warfarin 2.5 MG tablet Commonly known as: COUMADIN Take as directed by the anticoagulation clinic. If you are unsure how to take this medication, talk to your nurse or doctor. Original instructions: Take 2.5 mg by mouth once a week. ON [facility] What changed: Another medication with the same name was removed. Continue taking this medication, and follow the directions you see here. Changed by: Inocencio Homes, MD       No orders of the defined types were placed in this encounter.   Immunization History  Administered Date(s) Administered  . Fluad Quad(high Dose 65+) 03/07/2019  . Influenza Split 03/30/2012, 03/07/2019  . Influenza Whole 04/30/2006, 04/20/2008, 04/15/2009  . Influenza, High Dose Seasonal PF 04/20/2013, 03/27/2015, 03/31/2016, 04/22/2017, 04/20/2018  . Influenza,inj,Quad PF,6+ Mos 05/23/2014  . Moderna SARS-COVID-2 Vaccination 07/06/2019, 08/03/2019  . Pneumococcal Conjugate-13 04/24/2015  . Pneumococcal Polysaccharide-23 07/06/2009  . Td 10/30/2016    Social History   Tobacco Use  . Smoking status: Passive Smoke Exposure - Never Smoker  . Smokeless tobacco: Never Used  . Tobacco comment: husband smoked in home.   Substance Use Topics  . Alcohol use: No    Alcohol/week: 0.0 standard drinks     Vitals:   10/13/19 1620  BP: 119/76  Pulse: 74  Resp: 19  Temp: (!) 97.2 F (36.2 C)  SpO2: 96%   Body mass index is 28.73 kg/m.   Patient Active Problem List   Diagnosis Date Noted  . Altered behavior 05/10/2019  . Klebsiella cystitis 04/22/2019  . Metabolic encephalopathy [id number]  . Back pain 04/08/2019  . MCI (mild cognitive impairment) 03/07/2019  . Congestive heart failure (CHF) ([place]) 01/20/2019  . CAD (coronary artery disease) 06/07/2018  . Pulmonary hypertension, primary ([place]) 06/07/2018  . Generalized weakness   .  Abnormal LFTs 06/01/2018  . Hallucination 06/01/2018  . Abnormal chest x-ray 09/28/2015  . Edema of right lower extremity 09/28/2015  . Elevated brain natriuretic peptide (BNP) level 09/17/2015  . Seizures ([place]) 09/16/2015  . Supratherapeutic INR 08/30/2015  . Asthma 08/14/2015  . PCP NOTES >>>>>>>>>>>>>>>>>>>>>>>>>>>>>> 04/24/2015  . TIA (transient ischemic attack) 12/11/2014  . Dyslipidemia 12/11/2014  . History of CVA (cerebrovascular accident) 12/01/2014  . Thrombocytopenia ([place]) 12/01/2014  . Chronic renal insufficiency, stage II (mild) 11/12/2013  . Impingement syndrome, shoulder, left 11/09/2013  . Acquired short bowel syndrome 11/09/2013  . Annual physical exam 08/30/2013  . Hx pulmonary embolism 06/27/2013  . Warfarin anticoagulation 06/27/2013  . DDD (degenerative disc disease), lumbar 11/18/2012  . ATRIAL FIBRILLATION   07/08/2010  . COLON CANCER, HX OF 07/08/2010  . DIZZINESS 10/18/2009  . Essential hypertension 12/16/2007  . HIP PAIN, CHRONIC  12/28/2006  . ANEMIA-NOS 07/15/2006  . Personal history of venous thrombosis and embolism 07/15/2006    CMP     Component Value Date/Time   NA 147 09/18/2019 0000   NA 142 06/09/2012 1509   K 4.1 09/18/2019 0000   K 3.9 06/09/2012 1509   CL 112 (A) 09/18/2019 0000   CL 108 (H) 06/09/2012 1509   CO2 23 (A) 09/18/2019 0000   CO2 26 06/09/2012 1509   GLUCOSE 103 (H) 04/14/2019 0532   GLUCOSE 103 (H) 06/09/2012 1509   BUN 25 (A) 09/18/2019 0000   BUN 17.0 06/09/2012 1509   CREATININE 1.0 09/18/2019 0000   CREATININE 1.13 (H) 04/14/2019 0532   CREATININE 1.51 (H) 07/22/2018 1624   CREATININE 1.2 (H) 06/09/2012 1509   CALCIUM 9.2 09/18/2019 0000   CALCIUM 9.5 06/09/2012 1509   PROT 6.9 04/07/2019 1530   PROT 6.9 01/13/2018 1620   PROT 7.2 06/09/2012 1509   ALBUMIN 4.7 04/07/2019 1530   ALBUMIN 4.6 01/13/2018 1620   ALBUMIN 4.2 06/09/2012 1509   AST 31 04/07/2019 1530   AST 17 06/09/2012 1509   ALT 15 04/07/2019  1530   ALT 11 06/09/2012 1509   ALKPHOS 56 04/07/2019 1530   ALKPHOS 47 06/09/2012 1509   BILITOT 1.4 (H) 04/07/2019 1530   BILITOT 1.1 01/13/2018 1620   BILITOT 0.73 06/09/2012 1509   GFRNONAA 50.66 09/18/2019 0000   GFRAA 58.71 09/18/2019 0000   Recent Labs    01/22/19 0405 01/22/19 0405 01/24/19 0432 03/07/19 1454 04/08/19 0021 04/09/19 0456 04/11/19 0556 04/11/19 0556 04/12/19 0536 04/12/19 0536 04/14/19 0532 05/18/19 0000 09/07/19 0000 09/11/19 0000 09/18/19 0000  NA 142   < > 144   < > 144   < > 148*   < > 147*   < > 145   < > 146 147 147  K 3.2*   < > 4.7   < > 3.5   < > 4.3   < > 4.1   < > 3.8   < > 4.5 4.0 4.1  CL 105   < > 112*   < > 107   < > 111   < > 108   < > 109   < > 112* 112* 112*  CO2 27   < > 24   < > 27   < > 27   < > 25   < > 24   < > 26* 23* 23*  GLUCOSE 92   < > 107*   < > 114*   < > 87  --  93  --  103*  --   --   --   --   BUN 24*   < > 25*   < > 41*   < > 31*   < > 29*   < > 26*   < > 27* 19 25*  CREATININE 1.41*   < > 1.33*   < > 1.96*   < > 1.28*   < > 1.26*   < > 1.13*   < > 1.0 1.0 1.0  CALCIUM 8.5*   < > 9.4   < > 9.2   < > 9.0   < > 9.3   < > 9.4   < > 9.0 9.1 9.2  MG 2.1  --  2.0  --  2.2  --   --   --   --   --   --   --   --   --   --  PHOS 3.4  --   --   --  4.0  --   --   --   --   --   --   --   --   --   --    < > = values in this interval not displayed.   Recent Labs    01/20/19 0918 03/07/19 1454 04/07/19 1530  AST 25 17 31   ALT 15 10 15   ALKPHOS 41 43 56  BILITOT 0.7 0.8 1.4*  PROT 5.9* 6.6 6.9  ALBUMIN 3.8 4.1 4.7   Recent Labs    04/07/19 1530 04/07/19 1530 04/08/19 0021 04/08/19 0021 04/12/19 0536 05/18/19 0000 07/14/19 0000 08/11/19 0000 09/07/19 0000  WBC 8.2   < > 6.9   < > 5.3   < > 3.6 5.2 3.4  NEUTROABS 6.5  --   --   --   --    < > 2 4 2   HGB 13.3   < > 12.7   < > 12.9   < > 12.4 12.4 11.9*  HCT 41.2   < > 40.0   < > 42.0   < > 37 37 36  MCV 95.2  --  97.6  --  100.0  --   --   --   --   PLT 75*    < > 76*   < > 70*   < > 60* 69* 63*   < > = values in this interval not displayed.   No results for input(s): CHOL, LDLCALC, TRIG in the last 8760 hours.  Invalid input(s): HCL No results found for: [facility] Lab Results  Component Value Date   TSH 3.58 08/22/2018   Lab Results  Component Value Date   HGBA1C 5.3 09/07/2019   Lab Results  Component Value Date   CHOL 97 08/16/2017   HDL 37.70 (L) 08/16/2017   LDLCALC 48 08/16/2017   TRIG 56.0 08/16/2017   CHOLHDL 3 08/16/2017    Significant Diagnostic Results in last 30 days:  No results found.  Assessment and Plan  Atrial fibrillation/anticoagulated on Coumadin-patient's INR today is 3.0 up from 2.7 last week and 2.3 the week before, and steadily climbing on Coumadin 2.5 mg Monday Wednesday and 2.0 mg every other day.  Will change to 2.5 mg of Coumadin on Wednesday and Coumadin 2 mg every other day and follow-up INR 1 week     [name] , MD

## 2019-10-14 ENCOUNTER — Encounter: Payer: Self-pay | Admitting: Internal Medicine

## 2019-10-16 ENCOUNTER — Encounter: Payer: Self-pay | Admitting: Internal Medicine

## 2019-10-16 ENCOUNTER — Non-Acute Institutional Stay (SKILLED_NURSING_FACILITY): Payer: Medicare Other | Admitting: Internal Medicine

## 2019-10-16 DIAGNOSIS — R635 Abnormal weight gain: Secondary | ICD-10-CM | POA: Diagnosis not present

## 2019-10-16 DIAGNOSIS — R7309 Other abnormal glucose: Secondary | ICD-10-CM

## 2019-10-16 DIAGNOSIS — E870 Hyperosmolality and hypernatremia: Secondary | ICD-10-CM

## 2019-10-16 NOTE — Progress Notes (Signed)
Location:    [name] Room Number: 211/D Place of Service:  SNF [phone]) Provider:  Henreitta Leber, MD  Patient Care Team: [name], MD as PCP - General (Internal Medicine)  Extended Emergency Contact Information Primary Emergency Contact: [name],[name] Address: [address]          [place], [place] [address] [name] of [place] Phone: [phone] Mobile Phone: [phone] Relation: Daughter  Code Status:  DNR Goals of care: Advanced Directive information Advanced Directives 10/16/2019  Does Patient Have a Medical Advance Directive? Yes  Type of Advance Directive Out of facility DNR (pink MOST or yellow form)  Does patient want to make changes to medical advance directive? No - Patient declined  Copy of [place] in Chart? -  Would patient like information on creating a medical advance directive? -  Pre-existing out of facility DNR order (yellow form or pink MOST form) Yellow form placed in chart (order not valid for inpatient use)    Chief complaint acute visit follow-up hypernatremia as well as CBGs.     HPI:  Pt is a 86 y.o. female seen today for an acute visit for follow-up of hypernatremia as well as follow-up of CBGs.  [name] is a long-term resident of facility and has a history of dementia with psychosis as well as CHF and a history of pulmonary embolism and atrial fibrillation [name] is on chronic Coumadin.  Also has a history of COPD in addition to thrombocytopenia as well as hypernatremia.  Her Lasix has been discontinued because of her elevated sodium.  Nonetheless her edema appears to be quite baseline   At times [name] has received IV fluids for her hypernatremia but lately we have been trying to push fluids aggressively and this appears to been fairly successful.  Sodium done most recently is 146 which appears to be her relative baseline recently.  [name] says [name] is trying to  drink as well [name] can [name] appears to be doing a fairly decent job of this.  We have also monitored her CBGs because occasionally [name] has an elevated blood sugar but it appears this is more of an anomaly-recent CBGs have been 99-1 19-I see 1 up to 236  but this is pretty unusual for her.  It appears on her metabolic panel  her glucoses are 89-83-the highest recent 1 I see is 186.  It was actually 90 on the one done on April 6  Currently [name] is sitting in her wheelchair comfortably does not really have any acute complaints-   Past Medical History:  Diagnosis Date  . Acute encephalopathy   . Acute renal failure superimposed on stage 3 chronic kidney disease ([place])   . Anemia   . Atrial fibrillation ([place])   . Colon cancer ([place]) 12/11  . Colon cancer ([place]) 2011  . Diarrhea   . DVT (deep venous thrombosis) ([place]) 11/06  . Elevated brain natriuretic peptide (BNP) level   . Frequent headaches   . Heart murmur   . Hyperkalemia   . Hypertension   . Pulmonary embolism ([place]) 11/06  . Seizures ([place])   . Stroke ([place]) 11/30/2014  . Urine retention 01/2019   Past Surgical History:  Procedure Laterality Date  . APPENDECTOMY  1952  . COLECTOMY  06/2010   partial, Dr. Donne Hazel complicated by LLE CVT; S/P IVC umbrella & anemia  . HIP FRACTURE SURGERY  2006   Trauma  . TOTAL  ABDOMINAL HYSTERECTOMY W/ BILATERAL SALPINGOOPHORECTOMY  1973   For Fibroids  . TOTAL HIP ARTHROPLASTY  01/03/07   Left hip replacement.  Marland Kitchen VENA CAVA FILTER PLACEMENT  06/28/2010    No Known Allergies  Outpatient Encounter Medications as of 10/16/2019  Medication Sig  . acetaminophen (TYLENOL) 500 MG tablet Take 1,000 mg by mouth every 8 (eight) hours as needed for headache (pain).   Marland Kitchen albuterol (PROVENTIL) (2.5 MG/3ML) 0.083% nebulizer solution Take 2.5 mg by nebulization every 6 (six) hours as needed for wheezing or shortness of breath.  Marland Kitchen albuterol (VENTOLIN HFA) 108 (90 Base) MCG/ACT inhaler Inhale 2 puffs into the  lungs every 6 (six) hours as needed for wheezing or shortness of breath.  Marland Kitchen aspirin 81 MG chewable tablet Chew 81 mg by mouth daily.  Marland Kitchen atorvastatin (LIPITOR) 20 MG tablet Take 1 tablet (20 mg total) by mouth daily.  . Calcium Carbonate-Vitamin D (OYSTER SHELL/VITAMIN D PO) Take 1 tablet by mouth daily. With breakfast  . Ensure (ENSURE) Take 237 mLs by mouth 3 (three) times daily between meals.  Marland Kitchen escitalopram (LEXAPRO) 5 MG tablet Take 5 mg by mouth daily.  . isosorbide mononitrate (IMDUR) 30 MG 24 hr tablet Take 15 mg by mouth daily.  Marland Kitchen loratadine (CLARITIN) 10 MG tablet Take 1 tablet (10 mg total) by mouth daily as needed (seasonal allergies).  . metoprolol tartrate (LOPRESSOR) 25 MG tablet Take 1.5 tablets (37.5 mg total) by mouth 2 (two) times daily.  . mometasone-formoterol (DULERA) 100-5 MCG/ACT AERO Inhale 2 puffs into the lungs 2 (two) times daily. Gargle and rinse your mouth with water after each use of this medication to help prevent dryness,  . Multiple Vitamin (MULTIVITAMIN WITH MINERALS) TABS tablet Take 1 tablet by mouth daily.  . polyethylene glycol (MIRALAX / GLYCOLAX) packet Take 17 g by mouth daily as needed.  . promethazine (PHENERGAN) 12.5 MG tablet Take 12.5 mg by mouth 3 (three) times daily as needed for nausea or vomiting.  Marland Kitchen QUEtiapine (SEROQUEL) 25 MG tablet Take 25 mg by mouth at bedtime.  . tamsulosin (FLOMAX) 0.4 MG CAPS capsule Take 1 capsule (0.4 mg total) by mouth daily.  . verapamil (VERELAN PM) 120 MG 24 hr capsule Take 120 mg by mouth at bedtime.  Marland Kitchen warfarin (COUMADIN) 2 MG tablet :Give 1 tablet by mouth daily except Wednesday  . warfarin (COUMADIN) 2.5 MG tablet Take 2.5 mg by mouth once a week. ON WEDNESDAYS  . [DISCONTINUED] QUEtiapine (SEROQUEL) 25 MG tablet Take 2 tablets (50 mg total) by mouth at bedtime. (Patient taking differently: Take 25 mg by mouth at bedtime. )   No facility-administered encounter medications on file as of 10/16/2019.    Review of  Systems   This is somewhat limited since patient is somewhat of a poor historian  General no complaints of fever or chills.  Skin does not complain of rashes itching or increased bruising.  Head ears eyes nose mouth and throat is not complain of visual changes or sore throat.  Respiratory no complaints of cough at times will complain of some shortness of breath but this appears to be more transitory.  Cardiac is not complain of chest pain or increasing edema.  GI does not complain of abdominal discomfort nausea vomiting diarrhea constipation.  GU is not complaining of dysuria.  Musculoskeletal does complain of weakness at times is not really complaining of joint pain.  Neurologic does not complain of being dizzy or having a headache or feeling numbness.  And psych does have a history at times of some psychosis and agitation but usually when I see her [name] is quite pleasant and appropriate.  Apparently [name] was recently moved out of her room to a different room because some agitation [name] was having with her roommate.   Immunization History  Administered Date(s) Administered  . Fluad Quad(high Dose 65+) 03/07/2019  . Influenza Split 03/30/2012, 03/07/2019  . Influenza Whole 04/30/2006, 04/20/2008, 04/15/2009  . Influenza, High Dose Seasonal PF 04/20/2013, 03/27/2015, 03/31/2016, 04/22/2017, 04/20/2018  . Influenza,inj,Quad PF,6+ Mos 05/23/2014  . Moderna SARS-COVID-2 Vaccination 07/06/2019, 08/03/2019  . Pneumococcal Conjugate-13 04/24/2015  . Pneumococcal Polysaccharide-23 07/06/2009  . Td 10/30/2016   Pertinent  Health Maintenance Due  Topic Date Due  . DEXA SCAN  02/27/2020 (Originally 11/26/1993)  . INFLUENZA VACCINE  02/04/2020  . PNA vac Low Risk Adult  Completed   Fall Risk  07/22/2018 01/18/2018 10/30/2016 08/14/2015 04/24/2015  Falls in the past year? 1 No No No No  Number falls in past yr: 1 - - - -  Injury with Fall? 0 - - - -   Functional Status Survey:     Vitals:   10/16/19 1553  BP: 129/79  Pulse: 71  Resp: 20  Temp: 98 F (36.7 C)  TempSrc: Oral  SpO2: 96%  Weight: 162 lb 3.2 oz (73.6 kg)  Height: 5\' 3"  (1.6 m)   Body mass index is 28.73 kg/m. Physical Exam   In general this is a pleasant elderly female in no distress sitting comfortably in her wheelchair.  Her skin is warm and dry.  Eyes visual acuity appears to be intact sclera and conjunctive are clear.  Oropharynx clear mucous membranes moist.  Chest is clear to auscultation with shallow air entry there is no labored breathing.  Heart is irregular irregular rate and rhythm with a baseline systolic murmur [name] has mild right lower extremity edema which is more apparent when [name] has her legs in a dependent position.  Abdomen is soft nontender with positive bowel sounds.  Musculoskeletal moves all her extremities x4 at baseline with arthritic changes of her knees and hands bilaterally.  Neurologic is grossly intact her speech is clear cannot really appreciate lateralizing findings.  Psych [name] continues to be pleasant and appropriate does have some cognitive deficits.    Labs reviewed:  October 10, 2019.  Sodium 146 potassium 4.5 BUN 30.1 creatinine 1.08.   Recent Labs    01/22/19 0405 01/22/19 0405 01/24/19 0432 03/07/19 1454 04/08/19 0021 04/09/19 0456 04/11/19 0556 04/11/19 0556 04/12/19 0536 04/12/19 0536 04/14/19 0532 05/18/19 0000 09/07/19 0000 09/11/19 0000 09/18/19 0000  NA 142   < > 144   < > 144   < > 148*   < > 147*   < > 145   < > 146 147 147  K 3.2*   < > 4.7   < > 3.5   < > 4.3   < > 4.1   < > 3.8   < > 4.5 4.0 4.1  CL 105   < > 112*   < > 107   < > 111   < > 108   < > 109   < > 112* 112* 112*  CO2 27   < > 24   < > 27   < > 27   < > 25   < > 24   < > 26* 23* 23*  GLUCOSE 92   < > 107*   < >  114*   < > 87  --  93  --  103*  --   --   --   --   BUN 24*   < > 25*   < > 41*   < > 31*   < > 29*   < > 26*   < > 27* 19 25*  CREATININE 1.41*    < > 1.33*   < > 1.96*   < > 1.28*   < > 1.26*   < > 1.13*   < > 1.0 1.0 1.0  CALCIUM 8.5*   < > 9.4   < > 9.2   < > 9.0   < > 9.3   < > 9.4   < > 9.0 9.1 9.2  MG 2.1  --  2.0  --  2.2  --   --   --   --   --   --   --   --   --   --   PHOS 3.4  --   --   --  4.0  --   --   --   --   --   --   --   --   --   --    < > = values in this interval not displayed.   Recent Labs    01/20/19 0918 03/07/19 1454 04/07/19 1530  AST 25 17 31   ALT 15 10 15   ALKPHOS 41 43 56  BILITOT 0.7 0.8 1.4*  PROT 5.9* 6.6 6.9  ALBUMIN 3.8 4.1 4.7   Recent Labs    04/07/19 1530 04/07/19 1530 04/08/19 0021 04/08/19 0021 04/12/19 0536 05/18/19 0000 07/14/19 0000 08/11/19 0000 09/07/19 0000  WBC 8.2   < > 6.9   < > 5.3   < > 3.6 5.2 3.4  NEUTROABS 6.5  --   --   --   --    < > 2 4 2   HGB 13.3   < > 12.7   < > 12.9   < > 12.4 12.4 11.9*  HCT 41.2   < > 40.0   < > 42.0   < > 37 37 36  MCV 95.2  --  97.6  --  100.0  --   --   --   --   PLT 75*   < > 76*   < > 70*   < > 60* 69* 63*   < > = values in this interval not displayed.   Lab Results  Component Value Date   TSH 3.58 08/22/2018   Lab Results  Component Value Date   HGBA1C 5.3 09/07/2019   Lab Results  Component Value Date   CHOL 97 08/16/2017   HDL 37.70 (L) 08/16/2017   LDLCALC 48 08/16/2017   TRIG 56.0 08/16/2017   CHOLHDL 3 08/16/2017    Significant Diagnostic Results in last 30 days:  No results found.  Assessment/Plan  #1 hypernatremia this there is some chronicity to this staff is encouraging fluids and this appears to be relatively stable with a sodium of 146 which has been basically her recent baseline will update this later this week to ensure stability.  2.  History of elevated blood sugars as noted above per discussion this is fairly rare her blood sugars largely appear to be more in the 80s to low 100s with no occasional spike I suspect this may be dietary related. Hemoglobin A1c was 5.3 last month   #3  weight  gain?-I note most recent listed weight is 162 pounds which is up about 9 pounds from her baseline-however it appears numerousl weights in the facility have had a significant weight gain so 1 would suspect a scale variation most likely--- clinically [name] appears to be at her baseline no really increased edema from her baseline--lung exam was benign and stable for her-[name] does not really complain of any acute shortness of breath.  At this point will continue to monitor weights and await updated weight.  [id number]

## 2019-10-17 ENCOUNTER — Non-Acute Institutional Stay (SKILLED_NURSING_FACILITY): Payer: Medicare Other | Admitting: Internal Medicine

## 2019-10-17 ENCOUNTER — Encounter: Payer: Self-pay | Admitting: Internal Medicine

## 2019-10-17 DIAGNOSIS — R635 Abnormal weight gain: Secondary | ICD-10-CM

## 2019-10-17 DIAGNOSIS — L03115 Cellulitis of right lower limb: Secondary | ICD-10-CM

## 2019-10-17 DIAGNOSIS — I5032 Chronic diastolic (congestive) heart failure: Secondary | ICD-10-CM | POA: Diagnosis not present

## 2019-10-17 NOTE — Progress Notes (Signed)
Location:  [facility] Room Number: 211-D Place of Service:  SNF (31)  [name], MD  Patient Care Team: [name], MD as PCP - General (Internal Medicine)  Extended Emergency Contact Information Primary Emergency Contact: [facility] Address: [address]          [place], [place] [address] [name] of [place] Phone: [phone] Mobile Phone: [phone] Relation: Daughter    Allergies: Patient has no known allergies.  Chief Complaint  Patient presents with  . Acute Visit    HPI: Patient is a 86 y.o. female who is being seen acutely for 2 reasons.  #1 patient has had a weight gain of 9.1 pounds over the past week but she is without shortness of breath or edema.  #2 patient has some redness lateral volar surface of her right foot with some mild edema to the foot and ankle.  Past Medical History:  Diagnosis Date  . Acute encephalopathy   . Acute renal failure superimposed on stage 3 chronic kidney disease ([place])   . Anemia   . Atrial fibrillation ([place])   . Colon cancer ([place]) 12/11  . Colon cancer ([place]) 2011  . Diarrhea   . DVT (deep venous thrombosis) ([place]) 11/06  . Elevated brain natriuretic peptide (BNP) level   . Frequent headaches   . Heart murmur   . Hyperkalemia   . Hypertension   . Pulmonary embolism ([place]) 11/06  . Seizures ([place])   . Stroke ([place]) 11/30/2014  . Urine retention 01/2019    Past Surgical History:  Procedure Laterality Date  . APPENDECTOMY  1952  . COLECTOMY  06/2010   partial, Dr. Donne Hazel complicated by LLE CVT; S/P IVC umbrella & anemia  . HIP FRACTURE SURGERY  2006   Trauma  . TOTAL ABDOMINAL HYSTERECTOMY W/ BILATERAL SALPINGOOPHORECTOMY  1973   For Fibroids  . TOTAL HIP ARTHROPLASTY  01/03/07   Left hip replacement.  Marland Kitchen VENA CAVA FILTER PLACEMENT  06/28/2010    Allergies as of 10/17/2019   No Known Allergies     Medication List       Accurate as of October 17, 2019   9:41 PM. If you have any questions, ask your nurse or doctor.        acetaminophen 500 MG tablet Commonly known as: TYLENOL Take 1,000 mg by mouth every 8 (eight) hours as needed for headache (pain).   albuterol (2.5 MG/3ML) 0.083% nebulizer solution Commonly known as: PROVENTIL Take 2.5 mg by nebulization every 6 (six) hours as needed for wheezing or shortness of breath.   albuterol 108 (90 Base) MCG/ACT inhaler Commonly known as: VENTOLIN HFA Inhale 2 puffs into the lungs every 6 (six) hours as needed for wheezing or shortness of breath.   aspirin 81 MG chewable tablet Chew 81 mg by mouth daily.   atorvastatin 20 MG tablet Commonly known as: LIPITOR Take 1 tablet (20 mg total) by mouth daily.   Ensure Take 237 mLs by mouth 3 (three) times daily between meals.   escitalopram 5 MG tablet Commonly known as: LEXAPRO Take 5 mg by mouth daily.   isosorbide mononitrate 30 MG 24 hr tablet Commonly known as: IMDUR Take 15 mg by mouth daily.   loratadine 10 MG tablet Commonly known as: CLARITIN Take 1 tablet (10 mg total) by mouth daily as needed (seasonal allergies).   metoprolol tartrate 25 MG tablet Commonly known as: LOPRESSOR Take 1.5 tablets (37.5 mg total) by mouth  2 (two) times daily.   mometasone-formoterol 100-5 MCG/ACT Aero Commonly known as: DULERA Inhale 2 puffs into the lungs 2 (two) times daily. Gargle and rinse your mouth with water after each use of this medication to help prevent dryness,   multivitamin with minerals Tabs tablet Take 1 tablet by mouth daily.   OYSTER SHELL/VITAMIN D PO Take 1 tablet by mouth daily. With breakfast   polyethylene glycol 17 g packet Commonly known as: MIRALAX / GLYCOLAX Take 17 g by mouth daily as needed.   promethazine 12.5 MG tablet Commonly known as: PHENERGAN Take 12.5 mg by mouth 3 (three) times daily as needed for nausea or vomiting.   QUEtiapine 25 MG tablet Commonly known as: SEROQUEL Take 25 mg by mouth  at bedtime.   tamsulosin 0.4 MG Caps capsule Commonly known as: FLOMAX Take 1 capsule (0.4 mg total) by mouth daily.   verapamil 120 MG 24 hr capsule Commonly known as: VERELAN PM Take 120 mg by mouth at bedtime.   Coumadin 2 MG tablet Generic drug: warfarin Take as directed by the anticoagulation clinic. If you are unsure how to take this medication, talk to your nurse or doctor. Original instructions: :Give 1 tablet by mouth daily except Wednesday   warfarin 2.5 MG tablet Commonly known as: COUMADIN Take as directed by the anticoagulation clinic. If you are unsure how to take this medication, talk to your nurse or doctor. Original instructions: Take 2.5 mg by mouth once a week. ON WEDNESDAYS       No orders of the defined types were placed in this encounter.   Immunization History  Administered Date(s) Administered  . Fluad Quad(high Dose 65+) 03/07/2019  . Influenza Split 03/30/2012, 03/07/2019  . Influenza Whole 04/30/2006, 04/20/2008, 04/15/2009  . Influenza, High Dose Seasonal PF 04/20/2013, 03/27/2015, 03/31/2016, 04/22/2017, 04/20/2018  . Influenza,inj,Quad PF,6+ Mos 05/23/2014  . Moderna SARS-COVID-2 Vaccination 07/06/2019, 08/03/2019  . Pneumococcal Conjugate-13 04/24/2015  . Pneumococcal Polysaccharide-23 07/06/2009  . Td 10/30/2016    Social History   Tobacco Use  . Smoking status: Passive Smoke Exposure - Never Smoker  . Smokeless tobacco: Never Used  . Tobacco comment: husband smoked in home.   Substance Use Topics  . Alcohol use: No    Alcohol/week: 0.0 standard drinks    Review of Systems   GENERAL:  no fevers, fatigue, appetite changes SKIN: No itching, rash HEENT: No complaint RESPIRATORY: No cough, wheezing, SOB CARDIAC: No chest pain, palpitations, lower extremity edema  GI: No abdominal pain, No N/V/D or constipation, No heartburn or reflux  GU: No dysuria, frequency or urgency, or incontinence  MUSCULOSKELETAL: No unrelieved bone/joint  pain NEUROLOGIC: No headache, dizziness  PSYCHIATRIC: No overt anxiety or sadness  Vitals:   10/17/19 1514  BP: 129/79  Pulse: 71  Resp: 20  Temp: 98 F (36.7 C)   Body mass index is 28.7 kg/m. Physical Exam  GENERAL APPEARANCE: Alert, conversant, No acute distress  SKIN: Mild redness and heat, some swelling to right dorsal lateral foot HEENT: Unremarkable RESPIRATORY: Breathing is even, unlabored. Lung sounds are clear   CARDIOVASCULAR: Heart RRR no murmurs, rubs or gallops. No peripheral edema left lower extremity, mild edema right ankle and lateral foot GASTROINTESTINAL: Abdomen is soft, non-tender, not distended w/ normal bowel sounds.  GENITOURINARY: Bladder non tender, not distended  MUSCULOSKELETAL: No abnormal joints or musculature NEUROLOGIC: Cranial nerves 2-12 grossly intact. Moves all extremities PSYCHIATRIC: Mood and affect appropriate at times, with dementia and some ongoing confusion and  hallucinations  Patient Active Problem List   Diagnosis Date Noted  . Altered behavior 05/10/2019  . Klebsiella cystitis 04/22/2019  . Metabolic encephalopathy [id number]  . Back pain 04/08/2019  . MCI (mild cognitive impairment) 03/07/2019  . Congestive heart failure (CHF) ([place]) 01/20/2019  . CAD (coronary artery disease) 06/07/2018  . Pulmonary hypertension, primary ([place]) 06/07/2018  . Generalized weakness   . Abnormal LFTs 06/01/2018  . Hallucination 06/01/2018  . Abnormal chest x-ray 09/28/2015  . Edema of right lower extremity 09/28/2015  . Elevated brain natriuretic peptide (BNP) level 09/17/2015  . Seizures ([place]) 09/16/2015  . Supratherapeutic INR 08/30/2015  . Asthma 08/14/2015  . PCP NOTES >>>>>>>>>>>>>>>>>>>>>>>>>>>>>> 04/24/2015  . TIA (transient ischemic attack) 12/11/2014  . Dyslipidemia 12/11/2014  . History of CVA (cerebrovascular accident) 12/01/2014  . Thrombocytopenia ([place]) 12/01/2014  . Chronic renal insufficiency, stage II (mild) 11/12/2013  .  Impingement syndrome, shoulder, left 11/09/2013  . Acquired short bowel syndrome 11/09/2013  . Annual physical exam 08/30/2013  . Hx pulmonary embolism 06/27/2013  . Warfarin anticoagulation 06/27/2013  . DDD (degenerative disc disease), lumbar 11/18/2012  . ATRIAL FIBRILLATION   07/08/2010  . COLON CANCER, HX OF 07/08/2010  . DIZZINESS 10/18/2009  . Essential hypertension 12/16/2007  . HIP PAIN, CHRONIC 12/28/2006  . ANEMIA-NOS 07/15/2006  . Personal history of venous thrombosis and embolism 07/15/2006    CMP     Component Value Date/Time   NA 147 09/18/2019 0000   NA 142 06/09/2012 1509   K 4.1 09/18/2019 0000   K 3.9 06/09/2012 1509   CL 112 (A) 09/18/2019 0000   CL 108 (H) 06/09/2012 1509   CO2 23 (A) 09/18/2019 0000   CO2 26 06/09/2012 1509   GLUCOSE 103 (H) 04/14/2019 0532   GLUCOSE 103 (H) 06/09/2012 1509   BUN 25 (A) 09/18/2019 0000   BUN 17.0 06/09/2012 1509   CREATININE 1.0 09/18/2019 0000   CREATININE 1.13 (H) 04/14/2019 0532   CREATININE 1.51 (H) 07/22/2018 1624   CREATININE 1.2 (H) 06/09/2012 1509   CALCIUM 9.2 09/18/2019 0000   CALCIUM 9.5 06/09/2012 1509   PROT 6.9 04/07/2019 1530   PROT 6.9 01/13/2018 1620   PROT 7.2 06/09/2012 1509   ALBUMIN 4.7 04/07/2019 1530   ALBUMIN 4.6 01/13/2018 1620   ALBUMIN 4.2 06/09/2012 1509   AST 31 04/07/2019 1530   AST 17 06/09/2012 1509   ALT 15 04/07/2019 1530   ALT 11 06/09/2012 1509   ALKPHOS 56 04/07/2019 1530   ALKPHOS 47 06/09/2012 1509   BILITOT 1.4 (H) 04/07/2019 1530   BILITOT 1.1 01/13/2018 1620   BILITOT 0.73 06/09/2012 1509   GFRNONAA 50.66 09/18/2019 0000   GFRAA 58.71 09/18/2019 0000   Recent Labs    01/22/19 0405 01/22/19 0405 01/24/19 0432 03/07/19 1454 04/08/19 0021 04/09/19 0456 04/11/19 0556 04/11/19 0556 04/12/19 0536 04/12/19 0536 04/14/19 0532 05/18/19 0000 09/07/19 0000 09/11/19 0000 09/18/19 0000  NA 142   < > 144   < > 144   < > 148*   < > 147*   < > 145   < > 146 147 147    K 3.2*   < > 4.7   < > 3.5   < > 4.3   < > 4.1   < > 3.8   < > 4.5 4.0 4.1  CL 105   < > 112*   < > 107   < > 111   < > 108   < >  109   < > 112* 112* 112*  CO2 27   < > 24   < > 27   < > 27   < > 25   < > 24   < > 26* 23* 23*  GLUCOSE 92   < > 107*   < > 114*   < > 87  --  93  --  103*  --   --   --   --   BUN 24*   < > 25*   < > 41*   < > 31*   < > 29*   < > 26*   < > 27* 19 25*  CREATININE 1.41*   < > 1.33*   < > 1.96*   < > 1.28*   < > 1.26*   < > 1.13*   < > 1.0 1.0 1.0  CALCIUM 8.5*   < > 9.4   < > 9.2   < > 9.0   < > 9.3   < > 9.4   < > 9.0 9.1 9.2  MG 2.1  --  2.0  --  2.2  --   --   --   --   --   --   --   --   --   --   PHOS 3.4  --   --   --  4.0  --   --   --   --   --   --   --   --   --   --    < > = values in this interval not displayed.   Recent Labs    01/20/19 0918 03/07/19 1454 04/07/19 1530  AST 25 17 31   ALT 15 10 15   ALKPHOS 41 43 56  BILITOT 0.7 0.8 1.4*  PROT 5.9* 6.6 6.9  ALBUMIN 3.8 4.1 4.7   Recent Labs    04/07/19 1530 04/07/19 1530 04/08/19 0021 04/08/19 0021 04/12/19 0536 05/18/19 0000 07/14/19 0000 08/11/19 0000 09/07/19 0000  WBC 8.2   < > 6.9   < > 5.3   < > 3.6 5.2 3.4  NEUTROABS 6.5  --   --   --   --    < > 2 4 2   HGB 13.3   < > 12.7   < > 12.9   < > 12.4 12.4 11.9*  HCT 41.2   < > 40.0   < > 42.0   < > 37 37 36  MCV 95.2  --  97.6  --  100.0  --   --   --   --   PLT 75*   < > 76*   < > 70*   < > 60* 69* 63*   < > = values in this interval not displayed.   No results for input(s): CHOL, LDLCALC, TRIG in the last 8760 hours.  Invalid input(s): HCL No results found for: [facility] Lab Results  Component Value Date   TSH 3.58 08/22/2018   Lab Results  Component Value Date   HGBA1C 5.3 09/07/2019   Lab Results  Component Value Date   CHOL 97 08/16/2017   HDL 37.70 (L) 08/16/2017   LDLCALC 48 08/16/2017   TRIG 56.0 08/16/2017   CHOLHDL 3 08/16/2017    Significant Diagnostic Results in last 30 days:  No results  found.  Assessment and Plan  Cellulitis right foot-early; doxycycline 100 mg twice daily for 5 days and reassess  Weight gain/chronic diastolic congestive heart failure-patient has reported weight gain of 9.1 pounds in the past week but she has no edema lungs are clear she denies shortness of breath.  She is in a group of patients who have all had unusual weight gain without signs or symptoms.  We will continue to watch weight.  No problem-specific Assessment & Plan notes found for this encounter.   Labs/tests ordered:    [name], MD

## 2019-10-18 DIAGNOSIS — F5111 Primary hypersomnia: Secondary | ICD-10-CM | POA: Diagnosis not present

## 2019-10-18 DIAGNOSIS — F330 Major depressive disorder, recurrent, mild: Secondary | ICD-10-CM | POA: Diagnosis not present

## 2019-10-20 ENCOUNTER — Encounter: Payer: Self-pay | Admitting: Internal Medicine

## 2019-10-20 ENCOUNTER — Non-Acute Institutional Stay (SKILLED_NURSING_FACILITY): Payer: Medicare Other | Admitting: Internal Medicine

## 2019-10-20 DIAGNOSIS — Z7901 Long term (current) use of anticoagulants: Secondary | ICD-10-CM

## 2019-10-20 DIAGNOSIS — D649 Anemia, unspecified: Secondary | ICD-10-CM | POA: Diagnosis not present

## 2019-10-20 DIAGNOSIS — I4811 Longstanding persistent atrial fibrillation: Secondary | ICD-10-CM

## 2019-10-20 DIAGNOSIS — I10 Essential (primary) hypertension: Secondary | ICD-10-CM | POA: Diagnosis not present

## 2019-10-20 LAB — BASIC METABOLIC PANEL
BUN: 24 — AB (ref 4–21)
CO2: 21 (ref 13–22)
Chloride: 112 — AB (ref 99–108)
Creatinine: 0.8 (ref 0.5–1.1)
Glucose: 83
Potassium: 4.4 (ref 3.4–5.3)
Sodium: 146 (ref 137–147)

## 2019-10-20 LAB — COMPREHENSIVE METABOLIC PANEL
Calcium: 8.5 — AB (ref 8.7–10.7)
GFR calc Af Amer: 76.14
GFR calc non Af Amer: 65.7

## 2019-10-20 NOTE — Progress Notes (Deleted)
Location:  [facility] Room Number: 211-D Place of Service:  SNF (31)  [name], MD  Patient Care Team: [name], MD as PCP - General (Internal Medicine)  Extended Emergency Contact Information Primary Emergency Contact: [facility] Address: [address]          [place], [place] [address] [name] of [place] Phone: [phone] Mobile Phone: [phone] Relation: Daughter    Allergies: Patient has no known allergies.  Chief Complaint  Patient presents with  . Anticoagulation    Patient was seen for Coumadin management    HPI: Patient is a 86 y.o. female who   Past Medical History:  Diagnosis Date  . Acute encephalopathy   . Acute renal failure superimposed on stage 3 chronic kidney disease ([place])   . Anemia   . Atrial fibrillation ([place])   . Colon cancer ([place]) 12/11  . Colon cancer ([place]) 2011  . Diarrhea   . DVT (deep venous thrombosis) ([place]) 11/06  . Elevated brain natriuretic peptide (BNP) level   . Frequent headaches   . Heart murmur   . Hyperkalemia   . Hypertension   . Pulmonary embolism ([place]) 11/06  . Seizures ([place])   . Stroke ([place]) 11/30/2014  . Urine retention 01/2019    Past Surgical History:  Procedure Laterality Date  . APPENDECTOMY  1952  . COLECTOMY  06/2010   partial, Dr. Donne Hazel complicated by LLE CVT; S/P IVC umbrella & anemia  . HIP FRACTURE SURGERY  2006   Trauma  . TOTAL ABDOMINAL HYSTERECTOMY W/ BILATERAL SALPINGOOPHORECTOMY  1973   For Fibroids  . TOTAL HIP ARTHROPLASTY  01/03/07   Left hip replacement.  Marland Kitchen VENA CAVA FILTER PLACEMENT  06/28/2010    Allergies as of 10/20/2019   No Known Allergies     Medication List       Accurate as of October 20, 2019  3:30 PM. If you have any questions, ask your nurse or doctor.        acetaminophen 500 MG tablet Commonly known as: TYLENOL Take 1,000 mg by mouth every 8 (eight) hours as needed for headache (pain).     albuterol (2.5 MG/3ML) 0.083% nebulizer solution Commonly known as: PROVENTIL Take 2.5 mg by nebulization every 6 (six) hours as needed for wheezing or shortness of breath.   albuterol 108 (90 Base) MCG/ACT inhaler Commonly known as: VENTOLIN HFA Inhale 2 puffs into the lungs every 6 (six) hours as needed for wheezing or shortness of breath.   aspirin 81 MG chewable tablet Chew 81 mg by mouth daily.   atorvastatin 20 MG tablet Commonly known as: LIPITOR Take 1 tablet (20 mg total) by mouth daily.   Ensure Take 237 mLs by mouth 3 (three) times daily between meals.   escitalopram 5 MG tablet Commonly known as: LEXAPRO Take 5 mg by mouth daily.   isosorbide mononitrate 30 MG 24 hr tablet Commonly known as: IMDUR Take 15 mg by mouth daily.   loratadine 10 MG tablet Commonly known as: CLARITIN Take 1 tablet (10 mg total) by mouth daily as needed (seasonal allergies).   metoprolol tartrate 25 MG tablet Commonly known as: LOPRESSOR Take 1.5 tablets (37.5 mg total) by mouth 2 (two) times daily.   mometasone-formoterol 100-5 MCG/ACT Aero Commonly known as: DULERA Inhale 2 puffs into the lungs 2 (two) times daily. Gargle and rinse your mouth with water after each use of this medication to help prevent dryness,  multivitamin with minerals Tabs tablet Take 1 tablet by mouth daily.   OYSTER SHELL/VITAMIN D PO Take 1 tablet by mouth daily. With breakfast   polyethylene glycol 17 g packet Commonly known as: MIRALAX / GLYCOLAX Take 17 g by mouth daily as needed.   promethazine 12.5 MG tablet Commonly known as: PHENERGAN Take 12.5 mg by mouth 3 (three) times daily as needed for nausea or vomiting.   QUEtiapine 25 MG tablet Commonly known as: SEROQUEL Take 25 mg by mouth at bedtime.   tamsulosin 0.4 MG Caps capsule Commonly known as: FLOMAX Take 1 capsule (0.4 mg total) by mouth daily.   verapamil 120 MG 24 hr capsule Commonly known as: VERELAN PM Take 120 mg by mouth  at bedtime.   Coumadin 2 MG tablet Generic drug: warfarin Take as directed by the anticoagulation clinic. If you are unsure how to take this medication, talk to your nurse or doctor. Original instructions: :Give 1 tablet by mouth daily except Wednesday   warfarin 2.5 MG tablet Commonly known as: COUMADIN Take as directed by the anticoagulation clinic. If you are unsure how to take this medication, talk to your nurse or doctor. Original instructions: Take 2.5 mg by mouth once a week. ON WEDNESDAYS       No orders of the defined types were placed in this encounter.   Immunization History  Administered Date(s) Administered  . Fluad Quad(high Dose 65+) 03/07/2019  . Influenza Split 03/30/2012, 03/07/2019  . Influenza Whole 04/30/2006, 04/20/2008, 04/15/2009  . Influenza, High Dose Seasonal PF 04/20/2013, 03/27/2015, 03/31/2016, 04/22/2017, 04/20/2018  . Influenza,inj,Quad PF,6+ Mos 05/23/2014  . Moderna SARS-COVID-2 Vaccination 07/06/2019, 08/03/2019  . Pneumococcal Conjugate-13 04/24/2015  . Pneumococcal Polysaccharide-23 07/06/2009  . Td 10/30/2016    Social History   Tobacco Use  . Smoking status: Passive Smoke Exposure - Never Smoker  . Smokeless tobacco: Never Used  . Tobacco comment: husband smoked in home.   Substance Use Topics  . Alcohol use: No    Alcohol/week: 0.0 standard drinks     Vitals:   10/20/19 1528  BP: 130/61  Pulse: 90  Resp: 18  Temp: (!) 97.3 F (36.3 C)   Body mass index is 28.31 kg/m.   Patient Active Problem List   Diagnosis Date Noted  . Altered behavior 05/10/2019  . Klebsiella cystitis 04/22/2019  . Metabolic encephalopathy [id number]  . Back pain 04/08/2019  . MCI (mild cognitive impairment) 03/07/2019  . Congestive heart failure (CHF) ([place]) 01/20/2019  . CAD (coronary artery disease) 06/07/2018  . Pulmonary hypertension, primary ([place]) 06/07/2018  . Generalized weakness   . Abnormal LFTs 06/01/2018  . Hallucination  06/01/2018  . Abnormal chest x-ray 09/28/2015  . Edema of right lower extremity 09/28/2015  . Elevated brain natriuretic peptide (BNP) level 09/17/2015  . Seizures ([place]) 09/16/2015  . Supratherapeutic INR 08/30/2015  . Asthma 08/14/2015  . PCP NOTES >>>>>>>>>>>>>>>>>>>>>>>>>>>>>> 04/24/2015  . TIA (transient ischemic attack) 12/11/2014  . Dyslipidemia 12/11/2014  . History of CVA (cerebrovascular accident) 12/01/2014  . Thrombocytopenia ([place]) 12/01/2014  . Chronic renal insufficiency, stage II (mild) 11/12/2013  . Impingement syndrome, shoulder, left 11/09/2013  . Acquired short bowel syndrome 11/09/2013  . Annual physical exam 08/30/2013  . Hx pulmonary embolism 06/27/2013  . Warfarin anticoagulation 06/27/2013  . DDD (degenerative disc disease), lumbar 11/18/2012  . ATRIAL FIBRILLATION   07/08/2010  . COLON CANCER, HX OF 07/08/2010  . DIZZINESS 10/18/2009  . Essential hypertension 12/16/2007  . HIP PAIN, CHRONIC 12/28/2006  .  ANEMIA-NOS 07/15/2006  . Personal history of venous thrombosis and embolism 07/15/2006    CMP     Component Value Date/Time   NA 147 09/18/2019 0000   NA 142 06/09/2012 1509   K 4.1 09/18/2019 0000   K 3.9 06/09/2012 1509   CL 112 (A) 09/18/2019 0000   CL 108 (H) 06/09/2012 1509   CO2 23 (A) 09/18/2019 0000   CO2 26 06/09/2012 1509   GLUCOSE 103 (H) 04/14/2019 0532   GLUCOSE 103 (H) 06/09/2012 1509   BUN 25 (A) 09/18/2019 0000   BUN 17.0 06/09/2012 1509   CREATININE 1.0 09/18/2019 0000   CREATININE 1.13 (H) 04/14/2019 0532   CREATININE 1.51 (H) 07/22/2018 1624   CREATININE 1.2 (H) 06/09/2012 1509   CALCIUM 9.2 09/18/2019 0000   CALCIUM 9.5 06/09/2012 1509   PROT 6.9 04/07/2019 1530   PROT 6.9 01/13/2018 1620   PROT 7.2 06/09/2012 1509   ALBUMIN 4.7 04/07/2019 1530   ALBUMIN 4.6 01/13/2018 1620   ALBUMIN 4.2 06/09/2012 1509   AST 31 04/07/2019 1530   AST 17 06/09/2012 1509   ALT 15 04/07/2019 1530   ALT 11 06/09/2012 1509   ALKPHOS 56  04/07/2019 1530   ALKPHOS 47 06/09/2012 1509   BILITOT 1.4 (H) 04/07/2019 1530   BILITOT 1.1 01/13/2018 1620   BILITOT 0.73 06/09/2012 1509   GFRNONAA 50.66 09/18/2019 0000   GFRAA 58.71 09/18/2019 0000   Recent Labs    01/22/19 0405 01/22/19 0405 01/24/19 0432 03/07/19 1454 04/08/19 0021 04/09/19 0456 04/11/19 0556 04/11/19 0556 04/12/19 0536 04/12/19 0536 04/14/19 0532 05/18/19 0000 09/07/19 0000 09/11/19 0000 09/18/19 0000  NA 142   < > 144   < > 144   < > 148*   < > 147*   < > 145   < > 146 147 147  K 3.2*   < > 4.7   < > 3.5   < > 4.3   < > 4.1   < > 3.8   < > 4.5 4.0 4.1  CL 105   < > 112*   < > 107   < > 111   < > 108   < > 109   < > 112* 112* 112*  CO2 27   < > 24   < > 27   < > 27   < > 25   < > 24   < > 26* 23* 23*  GLUCOSE 92   < > 107*   < > 114*   < > 87  --  93  --  103*  --   --   --   --   BUN 24*   < > 25*   < > 41*   < > 31*   < > 29*   < > 26*   < > 27* 19 25*  CREATININE 1.41*   < > 1.33*   < > 1.96*   < > 1.28*   < > 1.26*   < > 1.13*   < > 1.0 1.0 1.0  CALCIUM 8.5*   < > 9.4   < > 9.2   < > 9.0   < > 9.3   < > 9.4   < > 9.0 9.1 9.2  MG 2.1  --  2.0  --  2.2  --   --   --   --   --   --   --   --   --   --  PHOS 3.4  --   --   --  4.0  --   --   --   --   --   --   --   --   --   --    < > = values in this interval not displayed.   Recent Labs    01/20/19 0918 03/07/19 1454 04/07/19 1530  AST 25 17 31   ALT 15 10 15   ALKPHOS 41 43 56  BILITOT 0.7 0.8 1.4*  PROT 5.9* 6.6 6.9  ALBUMIN 3.8 4.1 4.7   Recent Labs    04/07/19 1530 04/07/19 1530 04/08/19 0021 04/08/19 0021 04/12/19 0536 05/18/19 0000 07/14/19 0000 08/11/19 0000 09/07/19 0000  WBC 8.2   < > 6.9   < > 5.3   < > 3.6 5.2 3.4  NEUTROABS 6.5  --   --   --   --    < > 2 4 2   HGB 13.3   < > 12.7   < > 12.9   < > 12.4 12.4 11.9*  HCT 41.2   < > 40.0   < > 42.0   < > 37 37 36  MCV 95.2  --  97.6  --  100.0  --   --   --   --   PLT 75*   < > 76*   < > 70*   < > 60* 69* 63*   < > =  values in this interval not displayed.   No results for input(s): CHOL, LDLCALC, TRIG in the last 8760 hours.  Invalid input(s): HCL No results found for: [facility] Lab Results  Component Value Date   TSH 3.58 08/22/2018   Lab Results  Component Value Date   HGBA1C 5.3 09/07/2019   Lab Results  Component Value Date   CHOL 97 08/16/2017   HDL 37.70 (L) 08/16/2017   LDLCALC 48 08/16/2017   TRIG 56.0 08/16/2017   CHOLHDL 3 08/16/2017    Significant Diagnostic Results in last 30 days:  No results found.  Assessment and Plan  No problem-specific Assessment & Plan notes found for this encounter.   Labs/tests ordered:    [name] , MD

## 2019-10-21 NOTE — Progress Notes (Signed)
Location:  [facility] Room Number: 211-D Place of Service:  SNF (31)  [name], MD  Patient Care Team: [name], MD as PCP - General (Internal Medicine)  Extended Emergency Contact Information Primary Emergency Contact: [facility] Address: [address]          [place], [place] [address] [name] of [place] Phone: [phone] Mobile Phone: [phone] Relation: Daughter    Allergies: Patient has no known allergies.  Chief Complaint  Patient presents with  . Anticoagulation    Coumadin management    HPI: Patient is a 86 y.o. female who is being seen for INR check.  Patient is on Coumadin for atrial fibrillation, history of DVT and history of PE.  Today patient's INR is 2.2 which is therapeutic.  Past Medical History:  Diagnosis Date  . Acute encephalopathy   . Acute renal failure superimposed on stage 3 chronic kidney disease ([place])   . Anemia   . Atrial fibrillation ([place])   . Colon cancer ([place]) 12/11  . Colon cancer ([place]) 2011  . Diarrhea   . DVT (deep venous thrombosis) ([place]) 11/06  . Elevated brain natriuretic peptide (BNP) level   . Frequent headaches   . Heart murmur   . Hyperkalemia   . Hypertension   . Pulmonary embolism ([place]) 11/06  . Seizures ([place])   . Stroke ([place]) 11/30/2014  . Urine retention 01/2019    Past Surgical History:  Procedure Laterality Date  . APPENDECTOMY  1952  . COLECTOMY  06/2010   partial, Dr. Donne Hazel complicated by LLE CVT; S/P IVC umbrella & anemia  . HIP FRACTURE SURGERY  2006   Trauma  . TOTAL ABDOMINAL HYSTERECTOMY W/ BILATERAL SALPINGOOPHORECTOMY  1973   For Fibroids  . TOTAL HIP ARTHROPLASTY  01/03/07   Left hip replacement.  Marland Kitchen VENA CAVA FILTER PLACEMENT  06/28/2010    Allergies as of 10/20/2019   No Known Allergies     Medication List       Accurate as of October 20, 2019 11:59 PM. If you have any questions, ask your nurse or doctor.         acetaminophen 500 MG tablet Commonly known as: TYLENOL Take 1,000 mg by mouth every 8 (eight) hours as needed for headache (pain).   albuterol (2.5 MG/3ML) 0.083% nebulizer solution Commonly known as: PROVENTIL Take 2.5 mg by nebulization every 6 (six) hours as needed for wheezing or shortness of breath.   albuterol 108 (90 Base) MCG/ACT inhaler Commonly known as: VENTOLIN HFA Inhale 2 puffs into the lungs every 6 (six) hours as needed for wheezing or shortness of breath.   aspirin 81 MG chewable tablet Chew 81 mg by mouth daily.   atorvastatin 20 MG tablet Commonly known as: LIPITOR Take 1 tablet (20 mg total) by mouth daily.   doxycycline 100 MG tablet Commonly known as: VIBRA-TABS Take 100 mg by mouth 2 (two) times daily.   Ensure Take 237 mLs by mouth 3 (three) times daily between meals.   escitalopram 5 MG tablet Commonly known as: LEXAPRO Take 5 mg by mouth daily.   isosorbide mononitrate 30 MG 24 hr tablet Commonly known as: IMDUR Take 15 mg by mouth daily.   loratadine 10 MG tablet Commonly known as: CLARITIN Take 1 tablet (10 mg total) by mouth daily as needed (seasonal allergies).   metoprolol tartrate 25 MG tablet Commonly known as: LOPRESSOR Take 1.5 tablets (37.5 mg total) by mouth 2 (  two) times daily.   mometasone-formoterol 100-5 MCG/ACT Aero Commonly known as: DULERA Inhale 2 puffs into the lungs 2 (two) times daily. Gargle and rinse your mouth with water after each use of this medication to help prevent dryness,   multivitamin with minerals Tabs tablet Take 1 tablet by mouth daily.   OYSTER SHELL/VITAMIN D PO Take 1 tablet by mouth daily. With breakfast   polyethylene glycol 17 g packet Commonly known as: MIRALAX / GLYCOLAX Take 17 g by mouth daily as needed.   promethazine 12.5 MG tablet Commonly known as: PHENERGAN Take 12.5 mg by mouth 3 (three) times daily as needed for nausea or vomiting.   QUEtiapine 25 MG tablet Commonly known as:  SEROQUEL Take 25 mg by mouth at bedtime.   tamsulosin 0.4 MG Caps capsule Commonly known as: FLOMAX Take 1 capsule (0.4 mg total) by mouth daily.   verapamil 120 MG 24 hr capsule Commonly known as: VERELAN PM Take 120 mg by mouth at bedtime.   Coumadin 2 MG tablet Generic drug: warfarin Take as directed by the anticoagulation clinic. If you are unsure how to take this medication, talk to your nurse or doctor. Original instructions: Take 2 mg by mouth See admin instructions. Give 1 tablet by mouth daily except Wednesdays   warfarin 2.5 MG tablet Commonly known as: COUMADIN Take as directed by the anticoagulation clinic. If you are unsure how to take this medication, talk to your nurse or doctor. Original instructions: Take 2.5 mg by mouth once a week. Wednesdays       No orders of the defined types were placed in this encounter.   Immunization History  Administered Date(s) Administered  . Fluad Quad(high Dose 65+) 03/07/2019  . Influenza Split 03/30/2012, 03/07/2019  . Influenza Whole 04/30/2006, 04/20/2008, 04/15/2009  . Influenza, High Dose Seasonal PF 04/20/2013, 03/27/2015, 03/31/2016, 04/22/2017, 04/20/2018  . Influenza,inj,Quad PF,6+ Mos 05/23/2014  . Moderna SARS-COVID-2 Vaccination 07/06/2019, 08/03/2019  . Pneumococcal Conjugate-13 04/24/2015  . Pneumococcal Polysaccharide-23 07/06/2009  . Td 10/30/2016    Social History   Tobacco Use  . Smoking status: Passive Smoke Exposure - Never Smoker  . Smokeless tobacco: Never Used  . Tobacco comment: husband smoked in home.   Substance Use Topics  . Alcohol use: No    Alcohol/week: 0.0 standard drinks     Vitals:   10/20/19 1528  BP: 130/61  Pulse: 90  Resp: 18  Temp: (!) 97.3 F (36.3 C)   Body mass index is 28.31 kg/m.   Patient Active Problem List   Diagnosis Date Noted  . Altered behavior 05/10/2019  . Klebsiella cystitis 04/22/2019  . Metabolic encephalopathy [id number]  . Back pain 04/08/2019   . MCI (mild cognitive impairment) 03/07/2019  . Congestive heart failure (CHF) ([place]) 01/20/2019  . CAD (coronary artery disease) 06/07/2018  . Pulmonary hypertension, primary ([place]) 06/07/2018  . Generalized weakness   . Abnormal LFTs 06/01/2018  . Hallucination 06/01/2018  . Abnormal chest x-ray 09/28/2015  . Edema of right lower extremity 09/28/2015  . Elevated brain natriuretic peptide (BNP) level 09/17/2015  . Seizures ([place]) 09/16/2015  . Supratherapeutic INR 08/30/2015  . Asthma 08/14/2015  . PCP NOTES >>>>>>>>>>>>>>>>>>>>>>>>>>>>>> 04/24/2015  . TIA (transient ischemic attack) 12/11/2014  . Dyslipidemia 12/11/2014  . History of CVA (cerebrovascular accident) 12/01/2014  . Thrombocytopenia ([place]) 12/01/2014  . Chronic renal insufficiency, stage II (mild) 11/12/2013  . Impingement syndrome, shoulder, left 11/09/2013  . Acquired short bowel syndrome 11/09/2013  . Annual physical exam  08/30/2013  . Hx pulmonary embolism 06/27/2013  . Warfarin anticoagulation 06/27/2013  . DDD (degenerative disc disease), lumbar 11/18/2012  . ATRIAL FIBRILLATION   07/08/2010  . COLON CANCER, HX OF 07/08/2010  . DIZZINESS 10/18/2009  . Essential hypertension 12/16/2007  . HIP PAIN, CHRONIC 12/28/2006  . ANEMIA-NOS 07/15/2006  . Personal history of venous thrombosis and embolism 07/15/2006    CMP     Component Value Date/Time   NA 146 10/10/2019 0000   NA 142 06/09/2012 1509   K 4.5 10/10/2019 0000   K 3.9 06/09/2012 1509   CL 111 (A) 10/10/2019 0000   CL 108 (H) 06/09/2012 1509   CO2 20 10/10/2019 0000   CO2 26 06/09/2012 1509   GLUCOSE 103 (H) 04/14/2019 0532   GLUCOSE 103 (H) 06/09/2012 1509   BUN 30 (A) 10/10/2019 0000   BUN 17.0 06/09/2012 1509   CREATININE 1.1 10/10/2019 0000   CREATININE 1.13 (H) 04/14/2019 0532   CREATININE 1.51 (H) 07/22/2018 1624   CREATININE 1.2 (H) 06/09/2012 1509   CALCIUM 8.9 10/10/2019 0000   CALCIUM 9.5 06/09/2012 1509   PROT 6.9 04/07/2019 1530    PROT 6.9 01/13/2018 1620   PROT 7.2 06/09/2012 1509   ALBUMIN 4.7 04/07/2019 1530   ALBUMIN 4.6 01/13/2018 1620   ALBUMIN 4.2 06/09/2012 1509   AST 31 04/07/2019 1530   AST 17 06/09/2012 1509   ALT 15 04/07/2019 1530   ALT 11 06/09/2012 1509   ALKPHOS 56 04/07/2019 1530   ALKPHOS 47 06/09/2012 1509   BILITOT 1.4 (H) 04/07/2019 1530   BILITOT 1.1 01/13/2018 1620   BILITOT 0.73 06/09/2012 1509   GFRNONAA 45.03 10/10/2019 0000   GFRAA 52.18 10/10/2019 0000   Recent Labs    01/22/19 0405 01/22/19 0405 01/24/19 0432 03/07/19 1454 04/08/19 0021 04/09/19 0456 04/11/19 0556 04/11/19 0556 04/12/19 0536 04/12/19 0536 04/14/19 0532 05/18/19 0000 09/18/19 0000 09/27/19 0000 10/10/19 0000  NA 142   < > 144   < > 144   < > 148*   < > 147*   < > 145   < > 147 144 146  K 3.2*   < > 4.7   < > 3.5   < > 4.3   < > 4.1   < > 3.8   < > 4.1 4.7 4.5  CL 105   < > 112*   < > 107   < > 111   < > 108   < > 109   < > 112* 109* 111*  CO2 27   < > 24   < > 27   < > 27   < > 25   < > 24   < > 23* 22 20  GLUCOSE 92   < > 107*   < > 114*   < > 87  --  93  --  103*  --   --   --   --   BUN 24*   < > 25*   < > 41*   < > 31*   < > 29*   < > 26*   < > 25* 39* 30*  CREATININE 1.41*   < > 1.33*   < > 1.96*   < > 1.28*   < > 1.26*   < > 1.13*   < > 1.0 1.1 1.1  CALCIUM 8.5*   < > 9.4   < > 9.2   < > 9.0   < >  9.3   < > 9.4   < > 9.2 8.8 8.9  MG 2.1  --  2.0  --  2.2  --   --   --   --   --   --   --   --   --   --   PHOS 3.4  --   --   --  4.0  --   --   --   --   --   --   --   --   --   --    < > = values in this interval not displayed.   Recent Labs    01/20/19 0918 03/07/19 1454 04/07/19 1530  AST 25 17 31   ALT 15 10 15   ALKPHOS 41 43 56  BILITOT 0.7 0.8 1.4*  PROT 5.9* 6.6 6.9  ALBUMIN 3.8 4.1 4.7   Recent Labs    04/07/19 1530 04/07/19 1530 04/08/19 0021 04/08/19 0021 04/12/19 0536 05/18/19 0000 07/14/19 0000 08/11/19 0000 09/07/19 0000  WBC 8.2   < > 6.9   < > 5.3   < > 3.6 5.2  3.4  NEUTROABS 6.5  --   --   --   --    < > 2 4 2   HGB 13.3   < > 12.7   < > 12.9   < > 12.4 12.4 11.9*  HCT 41.2   < > 40.0   < > 42.0   < > 37 37 36  MCV 95.2  --  97.6  --  100.0  --   --   --   --   PLT 75*   < > 76*   < > 70*   < > 60* 69* 63*   < > = values in this interval not displayed.   No results for input(s): CHOL, LDLCALC, TRIG in the last 8760 hours.  Invalid input(s): HCL No results found for: [facility] Lab Results  Component Value Date   TSH 3.58 08/22/2018   Lab Results  Component Value Date   HGBA1C 5.3 09/07/2019   Lab Results  Component Value Date   CHOL 97 08/16/2017   HDL 37.70 (L) 08/16/2017   LDLCALC 48 08/16/2017   TRIG 56.0 08/16/2017   CHOLHDL 3 08/16/2017    Significant Diagnostic Results in last 30 days:  No results found.  Assessment and Plan  Atrial fibrillation/anticoagulated on Coumadin-INR is 2.2, which is therapeutic, on Coumadin 2.5 mg Wednesday and Friday and 2 mg on every other day.  Continue same regimen and repeat INR in 1 week     [name] , MD

## 2019-10-21 NOTE — Progress Notes (Deleted)
Location:  [facility] Room Number: 211-D Place of Service:  SNF (31)  [name], MD  Patient Care Team: [name], MD as PCP - General (Internal Medicine)  Extended Emergency Contact Information Primary Emergency Contact: [facility] Address: [address]          [place], [place] [address] [name] of [place] Phone: [phone] Mobile Phone: [phone] Relation: Daughter    Allergies: Patient has no known allergies.  Chief Complaint  Patient presents with  . Medical Management of Chronic Issues    Routine [facility] visit    HPI: Patient is a 86 y.o. female who   Past Medical History:  Diagnosis Date  . Acute encephalopathy   . Acute renal failure superimposed on stage 3 chronic kidney disease ([place])   . Anemia   . Atrial fibrillation ([place])   . Colon cancer ([place]) 12/11  . Colon cancer ([place]) 2011  . Diarrhea   . DVT (deep venous thrombosis) ([place]) 11/06  . Elevated brain natriuretic peptide (BNP) level   . Frequent headaches   . Heart murmur   . Hyperkalemia   . Hypertension   . Pulmonary embolism ([place]) 11/06  . Seizures ([place])   . Stroke ([place]) 11/30/2014  . Urine retention 01/2019    Past Surgical History:  Procedure Laterality Date  . APPENDECTOMY  1952  . COLECTOMY  06/2010   partial, Dr. Donne Hazel complicated by LLE CVT; S/P IVC umbrella & anemia  . HIP FRACTURE SURGERY  2006   Trauma  . TOTAL ABDOMINAL HYSTERECTOMY W/ BILATERAL SALPINGOOPHORECTOMY  1973   For Fibroids  . TOTAL HIP ARTHROPLASTY  01/03/07   Left hip replacement.  Marland Kitchen VENA CAVA FILTER PLACEMENT  06/28/2010    Allergies as of 10/20/2019   No Known Allergies     Medication List       Accurate as of October 20, 2019 11:59 PM. If you have any questions, ask your nurse or doctor.        acetaminophen 500 MG tablet Commonly known as: TYLENOL Take 1,000 mg by mouth every 8 (eight) hours as needed for headache  (pain).   albuterol (2.5 MG/3ML) 0.083% nebulizer solution Commonly known as: PROVENTIL Take 2.5 mg by nebulization every 6 (six) hours as needed for wheezing or shortness of breath.   albuterol 108 (90 Base) MCG/ACT inhaler Commonly known as: VENTOLIN HFA Inhale 2 puffs into the lungs every 6 (six) hours as needed for wheezing or shortness of breath.   aspirin 81 MG chewable tablet Chew 81 mg by mouth daily.   atorvastatin 20 MG tablet Commonly known as: LIPITOR Take 1 tablet (20 mg total) by mouth daily.   Ensure Take 237 mLs by mouth 3 (three) times daily between meals.   escitalopram 5 MG tablet Commonly known as: LEXAPRO Take 5 mg by mouth daily.   isosorbide mononitrate 30 MG 24 hr tablet Commonly known as: IMDUR Take 15 mg by mouth daily.   loratadine 10 MG tablet Commonly known as: CLARITIN Take 1 tablet (10 mg total) by mouth daily as needed (seasonal allergies).   metoprolol tartrate 25 MG tablet Commonly known as: LOPRESSOR Take 1.5 tablets (37.5 mg total) by mouth 2 (two) times daily.   mometasone-formoterol 100-5 MCG/ACT Aero Commonly known as: DULERA Inhale 2 puffs into the lungs 2 (two) times daily. Gargle and rinse your mouth with water after each use of this medication to help prevent dryness,  multivitamin with minerals Tabs tablet Take 1 tablet by mouth daily.   OYSTER SHELL/VITAMIN D PO Take 1 tablet by mouth daily. With breakfast   polyethylene glycol 17 g packet Commonly known as: MIRALAX / GLYCOLAX Take 17 g by mouth daily as needed.   promethazine 12.5 MG tablet Commonly known as: PHENERGAN Take 12.5 mg by mouth 3 (three) times daily as needed for nausea or vomiting.   QUEtiapine 25 MG tablet Commonly known as: SEROQUEL Take 25 mg by mouth at bedtime.   tamsulosin 0.4 MG Caps capsule Commonly known as: FLOMAX Take 1 capsule (0.4 mg total) by mouth daily.   verapamil 120 MG 24 hr capsule Commonly known as: VERELAN PM Take 120 mg  by mouth at bedtime.   Coumadin 2 MG tablet Generic drug: warfarin Take as directed by the anticoagulation clinic. If you are unsure how to take this medication, talk to your nurse or doctor. Original instructions: :Give 1 tablet by mouth daily except Wednesday   warfarin 2.5 MG tablet Commonly known as: COUMADIN Take as directed by the anticoagulation clinic. If you are unsure how to take this medication, talk to your nurse or doctor. Original instructions: Take 2.5 mg by mouth once a week. ON WEDNESDAYS       No orders of the defined types were placed in this encounter.   Immunization History  Administered Date(s) Administered  . Fluad Quad(high Dose 65+) 03/07/2019  . Influenza Split 03/30/2012, 03/07/2019  . Influenza Whole 04/30/2006, 04/20/2008, 04/15/2009  . Influenza, High Dose Seasonal PF 04/20/2013, 03/27/2015, 03/31/2016, 04/22/2017, 04/20/2018  . Influenza,inj,Quad PF,6+ Mos 05/23/2014  . Moderna SARS-COVID-2 Vaccination 07/06/2019, 08/03/2019  . Pneumococcal Conjugate-13 04/24/2015  . Pneumococcal Polysaccharide-23 07/06/2009  . Td 10/30/2016    Social History   Tobacco Use  . Smoking status: Passive Smoke Exposure - Never Smoker  . Smokeless tobacco: Never Used  . Tobacco comment: husband smoked in home.   Substance Use Topics  . Alcohol use: No    Alcohol/week: 0.0 standard drinks    Review of Systems  DATA OBTAINED: from patient, nurse, medical record, family member GENERAL:  no fevers, fatigue, appetite changes SKIN: No itching, rash HEENT: No complaint RESPIRATORY: No cough, wheezing, SOB CARDIAC: No chest pain, palpitations, lower extremity edema  GI: No abdominal pain, No N/V/D or constipation, No heartburn or reflux  GU: No dysuria, frequency or urgency, or incontinence  MUSCULOSKELETAL: No unrelieved bone/joint pain NEUROLOGIC: No headache, dizziness  PSYCHIATRIC: No overt anxiety or sadness  Vitals:   10/20/19 1528  BP: 130/61  Pulse:  90  Resp: 18  Temp: (!) 97.3 F (36.3 C)   Body mass index is 28.31 kg/m. Physical Exam  GENERAL APPEARANCE: Alert, conversant, No acute distress  SKIN: No diaphoresis rash HEENT: Unremarkable RESPIRATORY: Breathing is even, unlabored. Lung sounds are clear   CARDIOVASCULAR: Heart RRR no murmurs, rubs or gallops. No peripheral edema  GASTROINTESTINAL: Abdomen is soft, non-tender, not distended w/ normal bowel sounds.  GENITOURINARY: Bladder non tender, not distended  MUSCULOSKELETAL: No abnormal joints or musculature NEUROLOGIC: Cranial nerves 2-12 grossly intact. Moves all extremities PSYCHIATRIC: Mood and affect appropriate to situation, no behavioral issues  Patient Active Problem List   Diagnosis Date Noted  . Altered behavior 05/10/2019  . Klebsiella cystitis 04/22/2019  . Metabolic encephalopathy [id number]  . Back pain 04/08/2019  . MCI (mild cognitive impairment) 03/07/2019  . Congestive heart failure (CHF) ([place]) 01/20/2019  . CAD (coronary artery disease) 06/07/2018  . Pulmonary  hypertension, primary ([place]) 06/07/2018  . Generalized weakness   . Abnormal LFTs 06/01/2018  . Hallucination 06/01/2018  . Abnormal chest x-ray 09/28/2015  . Edema of right lower extremity 09/28/2015  . Elevated brain natriuretic peptide (BNP) level 09/17/2015  . Seizures ([place]) 09/16/2015  . Supratherapeutic INR 08/30/2015  . Asthma 08/14/2015  . PCP NOTES >>>>>>>>>>>>>>>>>>>>>>>>>>>>>> 04/24/2015  . TIA (transient ischemic attack) 12/11/2014  . Dyslipidemia 12/11/2014  . History of CVA (cerebrovascular accident) 12/01/2014  . Thrombocytopenia ([place]) 12/01/2014  . Chronic renal insufficiency, stage II (mild) 11/12/2013  . Impingement syndrome, shoulder, left 11/09/2013  . Acquired short bowel syndrome 11/09/2013  . Annual physical exam 08/30/2013  . Hx pulmonary embolism 06/27/2013  . Warfarin anticoagulation 06/27/2013  . DDD (degenerative disc disease), lumbar 11/18/2012  . ATRIAL  FIBRILLATION   07/08/2010  . COLON CANCER, HX OF 07/08/2010  . DIZZINESS 10/18/2009  . Essential hypertension 12/16/2007  . HIP PAIN, CHRONIC 12/28/2006  . ANEMIA-NOS 07/15/2006  . Personal history of venous thrombosis and embolism 07/15/2006    CMP     Component Value Date/Time   NA 147 09/18/2019 0000   NA 142 06/09/2012 1509   K 4.1 09/18/2019 0000   K 3.9 06/09/2012 1509   CL 112 (A) 09/18/2019 0000   CL 108 (H) 06/09/2012 1509   CO2 23 (A) 09/18/2019 0000   CO2 26 06/09/2012 1509   GLUCOSE 103 (H) 04/14/2019 0532   GLUCOSE 103 (H) 06/09/2012 1509   BUN 25 (A) 09/18/2019 0000   BUN 17.0 06/09/2012 1509   CREATININE 1.0 09/18/2019 0000   CREATININE 1.13 (H) 04/14/2019 0532   CREATININE 1.51 (H) 07/22/2018 1624   CREATININE 1.2 (H) 06/09/2012 1509   CALCIUM 9.2 09/18/2019 0000   CALCIUM 9.5 06/09/2012 1509   PROT 6.9 04/07/2019 1530   PROT 6.9 01/13/2018 1620   PROT 7.2 06/09/2012 1509   ALBUMIN 4.7 04/07/2019 1530   ALBUMIN 4.6 01/13/2018 1620   ALBUMIN 4.2 06/09/2012 1509   AST 31 04/07/2019 1530   AST 17 06/09/2012 1509   ALT 15 04/07/2019 1530   ALT 11 06/09/2012 1509   ALKPHOS 56 04/07/2019 1530   ALKPHOS 47 06/09/2012 1509   BILITOT 1.4 (H) 04/07/2019 1530   BILITOT 1.1 01/13/2018 1620   BILITOT 0.73 06/09/2012 1509   GFRNONAA 50.66 09/18/2019 0000   GFRAA 58.71 09/18/2019 0000   Recent Labs    01/22/19 0405 01/22/19 0405 01/24/19 0432 03/07/19 1454 04/08/19 0021 04/09/19 0456 04/11/19 0556 04/11/19 0556 04/12/19 0536 04/12/19 0536 04/14/19 0532 05/18/19 0000 09/07/19 0000 09/11/19 0000 09/18/19 0000  NA 142   < > 144   < > 144   < > 148*   < > 147*   < > 145   < > 146 147 147  K 3.2*   < > 4.7   < > 3.5   < > 4.3   < > 4.1   < > 3.8   < > 4.5 4.0 4.1  CL 105   < > 112*   < > 107   < > 111   < > 108   < > 109   < > 112* 112* 112*  CO2 27   < > 24   < > 27   < > 27   < > 25   < > 24   < > 26* 23* 23*  GLUCOSE 92   < > 107*   < > 114*   < >  87  --  93  --  103*  --   --   --   --   BUN 24*   < > 25*   < > 41*   < > 31*   < > 29*   < > 26*   < > 27* 19 25*  CREATININE 1.41*   < > 1.33*   < > 1.96*   < > 1.28*   < > 1.26*   < > 1.13*   < > 1.0 1.0 1.0  CALCIUM 8.5*   < > 9.4   < > 9.2   < > 9.0   < > 9.3   < > 9.4   < > 9.0 9.1 9.2  MG 2.1  --  2.0  --  2.2  --   --   --   --   --   --   --   --   --   --   PHOS 3.4  --   --   --  4.0  --   --   --   --   --   --   --   --   --   --    < > = values in this interval not displayed.   Recent Labs    01/20/19 0918 03/07/19 1454 04/07/19 1530  AST 25 17 31   ALT 15 10 15   ALKPHOS 41 43 56  BILITOT 0.7 0.8 1.4*  PROT 5.9* 6.6 6.9  ALBUMIN 3.8 4.1 4.7   Recent Labs    04/07/19 1530 04/07/19 1530 04/08/19 0021 04/08/19 0021 04/12/19 0536 05/18/19 0000 07/14/19 0000 08/11/19 0000 09/07/19 0000  WBC 8.2   < > 6.9   < > 5.3   < > 3.6 5.2 3.4  NEUTROABS 6.5  --   --   --   --    < > 2 4 2   HGB 13.3   < > 12.7   < > 12.9   < > 12.4 12.4 11.9*  HCT 41.2   < > 40.0   < > 42.0   < > 37 37 36  MCV 95.2  --  97.6  --  100.0  --   --   --   --   PLT 75*   < > 76*   < > 70*   < > 60* 69* 63*   < > = values in this interval not displayed.   No results for input(s): CHOL, LDLCALC, TRIG in the last 8760 hours.  Invalid input(s): HCL No results found for: [facility] Lab Results  Component Value Date   TSH 3.58 08/22/2018   Lab Results  Component Value Date   HGBA1C 5.3 09/07/2019   Lab Results  Component Value Date   CHOL 97 08/16/2017   HDL 37.70 (L) 08/16/2017   LDLCALC 48 08/16/2017   TRIG 56.0 08/16/2017   CHOLHDL 3 08/16/2017    Significant Diagnostic Results in last 30 days:  No results found.  Assessment and Plan  No problem-specific Assessment & Plan notes found for this encounter.   Labs/tests ordered:    [name], MD

## 2019-10-27 ENCOUNTER — Non-Acute Institutional Stay (SKILLED_NURSING_FACILITY): Payer: Medicare Other | Admitting: Internal Medicine

## 2019-10-27 ENCOUNTER — Encounter: Payer: Self-pay | Admitting: Internal Medicine

## 2019-10-27 DIAGNOSIS — R791 Abnormal coagulation profile: Secondary | ICD-10-CM

## 2019-10-27 DIAGNOSIS — I4811 Longstanding persistent atrial fibrillation: Secondary | ICD-10-CM

## 2019-10-27 DIAGNOSIS — Z7901 Long term (current) use of anticoagulants: Secondary | ICD-10-CM

## 2019-10-27 NOTE — Progress Notes (Signed)
Location:  [facility] Room Number: 211-D Place of Service:  SNF (31)  Megan Duos, MD  Patient Care Team: Megan Duos, MD as PCP - General (Internal Medicine)  Extended Emergency Contact Information Primary Emergency Contact: [name] Address: [address]          [place], [place] [address] [name] Phone: [phone] Mobile Phone: [phone] Relation: Daughter    Allergies: Patient has no known allergies.  Chief Complaint  Patient presents with  . Anticoagulation    Patient is seen for Coumadin management    HPI: Patient is a 86 y.o. female who for INR check.  Patient's INR is [place] on 2.5 mg of Coumadin on Monday and Wednesday.  Past Medical History:  Diagnosis Date  . Acute encephalopathy   . Acute renal failure superimposed on stage 3 chronic kidney disease ([place])   . Anemia   . Atrial fibrillation ([place])   . Colon cancer ([place]) 12/11  . Colon cancer ([place]) 2011  . Diarrhea   . DVT (deep venous thrombosis) ([place]) 11/06  . Elevated brain natriuretic peptide (BNP) level   . Frequent headaches   . Heart murmur   . Hyperkalemia   . Hypertension   . Pulmonary embolism ([place]) 11/06  . Seizures ([place])   . Stroke ([place]) 11/30/2014  . Urine retention 01/2019    Past Surgical History:  Procedure Laterality Date  . APPENDECTOMY  1952  . COLECTOMY  06/2010   partial, Dr. Donne Hazel complicated by LLE CVT; S/P IVC umbrella & anemia  . HIP FRACTURE SURGERY  2006   Trauma  . TOTAL ABDOMINAL HYSTERECTOMY W/ BILATERAL SALPINGOOPHORECTOMY  1973   For Fibroids  . TOTAL HIP ARTHROPLASTY  01/03/07   Left hip replacement.  Marland Kitchen VENA CAVA FILTER PLACEMENT  06/28/2010    Allergies as of 10/27/2019   No Known Allergies     Medication List       Accurate as of October 27, 2019  3:17 PM. If you have any questions, ask your nurse or doctor.        acetaminophen 500 MG tablet Commonly known as:  TYLENOL Take 1,000 mg by mouth every 8 (eight) hours as needed for headache (pain).   albuterol (2.5 MG/3ML) 0.083% nebulizer solution Commonly known as: PROVENTIL Take 2.5 mg by nebulization every 6 (six) hours as needed for wheezing or shortness of breath.   albuterol 108 (90 Base) MCG/ACT inhaler Commonly known as: VENTOLIN HFA Inhale 2 puffs into the lungs every 6 (six) hours as needed for wheezing or shortness of breath.   aspirin 81 MG chewable tablet Chew 81 mg by mouth daily.   atorvastatin 20 MG tablet Commonly known as: LIPITOR Take 1 tablet (20 mg total) by mouth daily.   Ensure Take 237 mLs by mouth 3 (three) times daily between meals.   escitalopram 5 MG tablet Commonly known as: LEXAPRO Take 5 mg by mouth daily.   isosorbide mononitrate 30 MG 24 hr tablet Commonly known as: IMDUR Take 15 mg by mouth daily.   loratadine 10 MG tablet Commonly known as: CLARITIN Take 1 tablet (10 mg total) by mouth daily as needed (seasonal allergies).   metoprolol tartrate 25 MG tablet Commonly known as: LOPRESSOR Take 1.5 tablets (37.5 mg total) by mouth 2 (two) times daily.   mometasone-formoterol 100-5 MCG/ACT Aero Commonly known as: DULERA Inhale 2 puffs into the lungs 2 (two) times daily. Gargle and rinse your  mouth with water after each use of this medication to help prevent dryness,   multivitamin with minerals Tabs tablet Take 1 tablet by mouth daily.   OYSTER SHELL/VITAMIN D PO Take 1 tablet by mouth daily. With breakfast   polyethylene glycol 17 g packet Commonly known as: MIRALAX / GLYCOLAX Take 17 g by mouth daily as needed.   promethazine 12.5 MG tablet Commonly known as: PHENERGAN Take 12.5 mg by mouth 3 (three) times daily as needed for nausea or vomiting.   QUEtiapine 25 MG tablet Commonly known as: SEROQUEL Take 25 mg by mouth at bedtime.   tamsulosin 0.4 MG Caps capsule Commonly known as: FLOMAX Take 1 capsule (0.4 mg total) by mouth daily.    verapamil 120 MG 24 hr capsule Commonly known as: VERELAN PM Take 120 mg by mouth at bedtime.   Coumadin 2 MG tablet Generic drug: warfarin Take as directed by the anticoagulation clinic. If you are unsure how to take this medication, talk to your nurse or doctor. Original instructions: Take 2 mg by mouth See admin instructions. Give 1 tablet by mouth daily except Wednesdays   warfarin 2.5 MG tablet Commonly known as: COUMADIN Take as directed by the anticoagulation clinic. If you are unsure how to take this medication, talk to your nurse or doctor. Original instructions: Take 2.5 mg by mouth once a week. Wednesdays       No orders of the defined types were placed in this encounter.   Immunization History  Administered Date(s) Administered  . Fluad Quad(high Dose 65+) 03/07/2019  . Influenza Split 03/30/2012, 03/07/2019  . Influenza Whole 04/30/2006, 04/20/2008, 04/15/2009  . Influenza, High Dose Seasonal PF 04/20/2013, 03/27/2015, 03/31/2016, 04/22/2017, 04/20/2018  . Influenza,inj,Quad PF,6+ Mos 05/23/2014  . Moderna SARS-COVID-2 Vaccination 07/06/2019, 08/03/2019  . Pneumococcal Conjugate-13 04/24/2015  . Pneumococcal Polysaccharide-23 07/06/2009  . Td 10/30/2016    Social History   Tobacco Use  . Smoking status: Passive Smoke Exposure - Never Smoker  . Smokeless tobacco: Never Used  . Tobacco comment: husband smoked in home.   Substance Use Topics  . Alcohol use: No    Alcohol/week: 0.0 standard drinks     Vitals:   10/27/19 1516  BP: (!) 143/72  Pulse: 69  Resp: 18  Temp: (!) 97.5 F (36.4 C)   Body mass index is 27.62 kg/m.   Patient Active Problem List   Diagnosis Date Noted  . Altered behavior 05/10/2019  . Klebsiella cystitis 04/22/2019  . Metabolic encephalopathy [id number]  . Back pain 04/08/2019  . MCI (mild cognitive impairment) 03/07/2019  . Congestive heart failure (CHF) ([place]) 01/20/2019  . CAD (coronary artery disease) 06/07/2018  .  Pulmonary hypertension, primary ([place]) 06/07/2018  . Generalized weakness   . Abnormal LFTs 06/01/2018  . Hallucination 06/01/2018  . Abnormal chest x-ray 09/28/2015  . Edema of right lower extremity 09/28/2015  . Elevated brain natriuretic peptide (BNP) level 09/17/2015  . Seizures ([place]) 09/16/2015  . Supratherapeutic INR 08/30/2015  . Asthma 08/14/2015  . PCP NOTES >>>>>>>>>>>>>>>>>>>>>>>>>>>>>> 04/24/2015  . TIA (transient ischemic attack) 12/11/2014  . Dyslipidemia 12/11/2014  . History of CVA (cerebrovascular accident) 12/01/2014  . Thrombocytopenia ([place]) 12/01/2014  . Chronic renal insufficiency, stage II (mild) 11/12/2013  . Impingement syndrome, shoulder, left 11/09/2013  . Acquired short bowel syndrome 11/09/2013  . Annual physical exam 08/30/2013  . Hx pulmonary embolism 06/27/2013  . Warfarin anticoagulation 06/27/2013  . DDD (degenerative disc disease), lumbar 11/18/2012  . ATRIAL FIBRILLATION  07/08/2010  . COLON CANCER, HX OF 07/08/2010  . DIZZINESS 10/18/2009  . Essential hypertension 12/16/2007  . HIP PAIN, CHRONIC 12/28/2006  . ANEMIA-NOS 07/15/2006  . Personal history of venous thrombosis and embolism 07/15/2006    CMP     Component Value Date/Time   NA 146 10/20/2019 0000   NA 142 06/09/2012 1509   K 4.4 10/20/2019 0000   K 3.9 06/09/2012 1509   CL 112 (A) 10/20/2019 0000   CL 108 (H) 06/09/2012 1509   CO2 21 10/20/2019 0000   CO2 26 06/09/2012 1509   GLUCOSE 103 (H) 04/14/2019 0532   GLUCOSE 103 (H) 06/09/2012 1509   BUN 24 (A) 10/20/2019 0000   BUN 17.0 06/09/2012 1509   CREATININE 0.8 10/20/2019 0000   CREATININE 1.13 (H) 04/14/2019 0532   CREATININE 1.51 (H) 07/22/2018 1624   CREATININE 1.2 (H) 06/09/2012 1509   CALCIUM 8.5 (A) 10/20/2019 0000   CALCIUM 9.5 06/09/2012 1509   PROT 6.9 04/07/2019 1530   PROT 6.9 01/13/2018 1620   PROT 7.2 06/09/2012 1509   ALBUMIN 4.7 04/07/2019 1530   ALBUMIN 4.6 01/13/2018 1620   ALBUMIN 4.2 06/09/2012  1509   AST 31 04/07/2019 1530   AST 17 06/09/2012 1509   ALT 15 04/07/2019 1530   ALT 11 06/09/2012 1509   ALKPHOS 56 04/07/2019 1530   ALKPHOS 47 06/09/2012 1509   BILITOT 1.4 (H) 04/07/2019 1530   BILITOT 1.1 01/13/2018 1620   BILITOT 0.73 06/09/2012 1509   GFRNONAA 65.70 10/20/2019 0000   GFRAA 76.14 10/20/2019 0000   Recent Labs    01/22/19 0405 01/22/19 0405 01/24/19 0432 03/07/19 1454 04/08/19 0021 04/09/19 0456 04/11/19 0556 04/11/19 0556 04/12/19 0536 04/12/19 0536 04/14/19 0532 05/18/19 0000 09/27/19 0000 10/10/19 0000 10/20/19 0000  NA 142   < > 144   < > 144   < > 148*   < > 147*   < > 145   < > 144 146 146  K 3.2*   < > 4.7   < > 3.5   < > 4.3   < > 4.1   < > 3.8   < > 4.7 4.5 4.4  CL 105   < > 112*   < > 107   < > 111   < > 108   < > 109   < > 109* 111* 112*  CO2 27   < > 24   < > 27   < > 27   < > 25   < > 24   < > 22 20 21   GLUCOSE 92   < > 107*   < > 114*   < > 87  --  93  --  103*  --   --   --   --   BUN 24*   < > 25*   < > 41*   < > 31*   < > 29*   < > 26*   < > 39* 30* 24*  CREATININE 1.41*   < > 1.33*   < > 1.96*   < > 1.28*   < > 1.26*   < > 1.13*   < > 1.1 1.1 0.8  CALCIUM 8.5*   < > 9.4   < > 9.2   < > 9.0   < > 9.3   < > 9.4   < > 8.8 8.9 8.5*  MG 2.1  --  2.0  --  2.2  --   --   --   --   --   --   --   --   --   --  PHOS 3.4  --   --   --  4.0  --   --   --   --   --   --   --   --   --   --    < > = values in this interval not displayed.   Recent Labs    01/20/19 0918 03/07/19 1454 04/07/19 1530  AST 25 17 31   ALT 15 10 15   ALKPHOS 41 43 56  BILITOT 0.7 0.8 1.4*  PROT 5.9* 6.6 6.9  ALBUMIN 3.8 4.1 4.7   Recent Labs    04/07/19 1530 04/07/19 1530 04/08/19 0021 04/08/19 0021 04/12/19 0536 05/18/19 0000 07/14/19 0000 08/11/19 0000 09/07/19 0000  WBC 8.2   < > 6.9   < > 5.3   < > 3.6 5.2 3.4  NEUTROABS 6.5  --   --   --   --    < > 2 4 2   HGB 13.3   < > 12.7   < > 12.9   < > 12.4 12.4 11.9*  HCT 41.2   < > 40.0   < > 42.0    < > 37 37 36  MCV 95.2  --  97.6  --  100.0  --   --   --   --   PLT 75*   < > 76*   < > 70*   < > 60* 69* 63*   < > = values in this interval not displayed.   No results for input(s): CHOL, LDLCALC, TRIG in the last 8760 hours.  Invalid input(s): HCL No results found for: [facility] Lab Results  Component Value Date   TSH 3.58 08/22/2018   Lab Results  Component Value Date   HGBA1C 5.3 09/07/2019   Lab Results  Component Value Date   CHOL 97 08/16/2017   HDL 37.70 (L) 08/16/2017   LDLCALC 48 08/16/2017   TRIG 56.0 08/16/2017   CHOLHDL 3 08/16/2017    Significant Diagnostic Results in last 30 days:  No results found.  Assessment and Plan  Supratherapeutic INR/atrial fibrillation/anticoagulated on Coumadin-patient went out on Coumadin 2.5 mg on Monday and Wednesday which was down from 2.5 mg on Monday Wednesday Friday with 2 mg every other day of the week.;  Will hold until INR is less than or equal to 2.5.    [name] , MD

## 2019-10-28 ENCOUNTER — Encounter: Payer: Self-pay | Admitting: Internal Medicine

## 2019-10-30 ENCOUNTER — Non-Acute Institutional Stay (SKILLED_NURSING_FACILITY): Payer: Medicare Other | Admitting: Internal Medicine

## 2019-10-30 ENCOUNTER — Encounter: Payer: Self-pay | Admitting: Internal Medicine

## 2019-10-30 DIAGNOSIS — I25118 Atherosclerotic heart disease of native coronary artery with other forms of angina pectoris: Secondary | ICD-10-CM

## 2019-10-30 DIAGNOSIS — I4811 Longstanding persistent atrial fibrillation: Secondary | ICD-10-CM | POA: Diagnosis not present

## 2019-10-30 DIAGNOSIS — I10 Essential (primary) hypertension: Secondary | ICD-10-CM | POA: Diagnosis not present

## 2019-10-30 NOTE — Progress Notes (Signed)
Location:  [facility] Room Number: 211/D Place of Service:  SNF (31)  [name], MD  Patient Care Team: [name], MD as PCP - General (Internal Medicine)  Extended Emergency Contact Information Primary Emergency Contact: [facility] Address: [address]          [place], [place] [address] [name] of [place] Phone: [phone] Mobile Phone: [phone] Relation: Daughter    Allergies: Patient has no known allergies.  Chief Complaint  Patient presents with  . Medical Management of Chronic Issues    Routine visit of medical mangement    HPI: Patient is 86 y.o. female who is being seen for routine issues of coronary artery disease, persistent atrial fibrillation, and hypertension.  Past Medical History:  Diagnosis Date  . Acute encephalopathy   . Acute renal failure superimposed on stage 3 chronic kidney disease ([place])   . Anemia   . Atrial fibrillation ([place])   . Colon cancer ([place]) 12/11  . Colon cancer ([place]) 2011  . Diarrhea   . DVT (deep venous thrombosis) ([place]) 11/06  . Elevated brain natriuretic peptide (BNP) level   . Frequent headaches   . Heart murmur   . Hyperkalemia   . Hypertension   . Pulmonary embolism ([place]) 11/06  . Seizures ([place])   . Stroke ([place]) 11/30/2014  . Urine retention 01/2019    Past Surgical History:  Procedure Laterality Date  . APPENDECTOMY  1952  . COLECTOMY  06/2010   partial, Dr. Donne Hazel complicated by LLE CVT; S/P IVC umbrella & anemia  . HIP FRACTURE SURGERY  2006   Trauma  . TOTAL ABDOMINAL HYSTERECTOMY W/ BILATERAL SALPINGOOPHORECTOMY  1973   For Fibroids  . TOTAL HIP ARTHROPLASTY  01/03/07   Left hip replacement.  Marland Kitchen VENA CAVA FILTER PLACEMENT  06/28/2010    Allergies as of 10/30/2019   No Known Allergies     Medication List       Accurate as of October 30, 2019 11:59 PM. If you have any questions, ask your nurse or doctor.        STOP taking these  medications   Coumadin 2 MG tablet Generic drug: warfarin Stopped by: Inocencio Homes, MD   warfarin 2.5 MG tablet Commonly known as: COUMADIN Stopped by: Inocencio Homes, MD     TAKE these medications   acetaminophen 500 MG tablet Commonly known as: TYLENOL Take 1,000 mg by mouth every 8 (eight) hours as needed for headache (pain).   albuterol (2.5 MG/3ML) 0.083% nebulizer solution Commonly known as: PROVENTIL Take 2.5 mg by nebulization every 6 (six) hours as needed for wheezing or shortness of breath.   albuterol 108 (90 Base) MCG/ACT inhaler Commonly known as: VENTOLIN HFA Inhale 2 puffs into the lungs every 6 (six) hours as needed for wheezing or shortness of breath.   aspirin 81 MG chewable tablet Chew 81 mg by mouth daily.   atorvastatin 20 MG tablet Commonly known as: LIPITOR Take 1 tablet (20 mg total) by mouth daily.   Ensure Take 237 mLs by mouth 3 (three) times daily between meals.   escitalopram 5 MG tablet Commonly known as: LEXAPRO Take 5 mg by mouth daily.   isosorbide mononitrate 30 MG 24 hr tablet Commonly known as: IMDUR Take 15 mg by mouth daily.   loratadine 10 MG tablet Commonly known as: CLARITIN Take 1 tablet (10 mg total) by mouth daily as needed (seasonal allergies).   metoprolol tartrate 25 MG  tablet Commonly known as: LOPRESSOR Take 1.5 tablets (37.5 mg total) by mouth 2 (two) times daily.   mometasone-formoterol 100-5 MCG/ACT Aero Commonly known as: DULERA Inhale 2 puffs into the lungs 2 (two) times daily. Gargle and rinse your mouth with water after each use of this medication to help prevent dryness,   multivitamin with minerals Tabs tablet Take 1 tablet by mouth daily.   NON FORMULARY Diet order: Regular liberalized diet d/t poor intake.   OYSTER SHELL/VITAMIN D PO Take 1 tablet by mouth daily. With breakfast   polyethylene glycol 17 g packet Commonly known as: MIRALAX / GLYCOLAX Take 17 g by mouth daily as needed.     promethazine 12.5 MG tablet Commonly known as: PHENERGAN Take 12.5 mg by mouth 3 (three) times daily as needed for nausea or vomiting.   QUEtiapine 25 MG tablet Commonly known as: SEROQUEL Take 25 mg by mouth at bedtime.   tamsulosin 0.4 MG Caps capsule Commonly known as: FLOMAX Take 1 capsule (0.4 mg total) by mouth daily.   verapamil 120 MG 24 hr capsule Commonly known as: VERELAN PM Take 120 mg by mouth at bedtime.       No orders of the defined types were placed in this encounter.   Immunization History  Administered Date(s) Administered  . Fluad Quad(high Dose 65+) 03/07/2019  . Influenza Split 03/30/2012, 03/07/2019  . Influenza Whole 04/30/2006, 04/20/2008, 04/15/2009  . Influenza, High Dose Seasonal PF 04/20/2013, 03/27/2015, 03/31/2016, 04/22/2017, 04/20/2018  . Influenza,inj,Quad PF,6+ Mos 05/23/2014  . Moderna SARS-COVID-2 Vaccination 07/06/2019, 08/03/2019  . Pneumococcal Conjugate-13 04/24/2015  . Pneumococcal Polysaccharide-23 07/06/2009  . Td 10/30/2016    Social History   Tobacco Use  . Smoking status: Passive Smoke Exposure - Never Smoker  . Smokeless tobacco: Never Used  . Tobacco comment: husband smoked in home.   Substance Use Topics  . Alcohol use: No    Alcohol/week: 0.0 standard drinks    Review of Systems Patient cannot contribute some GENERAL:  no fevers, fatigue, appetite changes SKIN: No itching, rash HEENT: No complaint RESPIRATORY: No cough, wheezing, SOB CARDIAC: No chest pain, palpitations, lower extremity edema  GI: No abdominal pain, No N/V/D or constipation, No heartburn or reflux  GU: No dysuria, frequency or urgency, or incontinence  MUSCULOSKELETAL: No unrelieved bone/joint pain NEUROLOGIC: No headache, dizziness  PSYCHIATRIC: No overt anxiety or sadness  Vitals:   10/30/19 1146  BP: (!) 115/55  Pulse: (!) 55  Resp: 18  Temp: 98 F (36.7 C)  SpO2: 96%   Body mass index is 27.62 kg/m. Physical Exam  GENERAL  APPEARANCE: Alert, conversant, No acute distress  SKIN: No diaphoresis rash HEENT: Unremarkable RESPIRATORY: Breathing is even, unlabored. Lung sounds are clear   CARDIOVASCULAR: Heart irregular no murmurs, rubs or gallops. No peripheral edema  GASTROINTESTINAL: Abdomen is soft, non-tender, not distended w/ normal bowel sounds.  GENITOURINARY: Bladder non tender, not distended  MUSCULOSKELETAL: No abnormal joints or musculature NEUROLOGIC: Cranial nerves 2-12 grossly intact. Moves all extremities PSYCHIATRIC: Mood and affect with dementia, no behavioral issues  Patient Active Problem List   Diagnosis Date Noted  . Altered behavior 05/10/2019  . Klebsiella cystitis 04/22/2019  . Metabolic encephalopathy [id number]  . Back pain 04/08/2019  . MCI (mild cognitive impairment) 03/07/2019  . Congestive heart failure (CHF) ([place]) 01/20/2019  . CAD (coronary artery disease) 06/07/2018  . Pulmonary hypertension, primary ([place]) 06/07/2018  . Generalized weakness   . Abnormal LFTs 06/01/2018  . Hallucination 06/01/2018  .  Abnormal chest x-ray 09/28/2015  . Edema of right lower extremity 09/28/2015  . Elevated brain natriuretic peptide (BNP) level 09/17/2015  . Seizures ([place]) 09/16/2015  . Supratherapeutic INR 08/30/2015  . Asthma 08/14/2015  . PCP NOTES >>>>>>>>>>>>>>>>>>>>>>>>>>>>>> 04/24/2015  . TIA (transient ischemic attack) 12/11/2014  . Dyslipidemia 12/11/2014  . History of CVA (cerebrovascular accident) 12/01/2014  . Thrombocytopenia ([place]) 12/01/2014  . Chronic renal insufficiency, stage II (mild) 11/12/2013  . Impingement syndrome, shoulder, left 11/09/2013  . Acquired short bowel syndrome 11/09/2013  . Annual physical exam 08/30/2013  . Hx pulmonary embolism 06/27/2013  . Warfarin anticoagulation 06/27/2013  . DDD (degenerative disc disease), lumbar 11/18/2012  . ATRIAL FIBRILLATION   07/08/2010  . COLON CANCER, HX OF 07/08/2010  . DIZZINESS 10/18/2009  . Essential  hypertension 12/16/2007  . HIP PAIN, CHRONIC 12/28/2006  . ANEMIA-NOS 07/15/2006  . Personal history of venous thrombosis and embolism 07/15/2006    CMP     Component Value Date/Time   NA 146 10/20/2019 0000   NA 142 06/09/2012 1509   K 4.4 10/20/2019 0000   K 3.9 06/09/2012 1509   CL 112 (A) 10/20/2019 0000   CL 108 (H) 06/09/2012 1509   CO2 21 10/20/2019 0000   CO2 26 06/09/2012 1509   GLUCOSE 103 (H) 04/14/2019 0532   GLUCOSE 103 (H) 06/09/2012 1509   BUN 24 (A) 10/20/2019 0000   BUN 17.0 06/09/2012 1509   CREATININE 0.8 10/20/2019 0000   CREATININE 1.13 (H) 04/14/2019 0532   CREATININE 1.51 (H) 07/22/2018 1624   CREATININE 1.2 (H) 06/09/2012 1509   CALCIUM 8.5 (A) 10/20/2019 0000   CALCIUM 9.5 06/09/2012 1509   PROT 6.9 04/07/2019 1530   PROT 6.9 01/13/2018 1620   PROT 7.2 06/09/2012 1509   ALBUMIN 4.7 04/07/2019 1530   ALBUMIN 4.6 01/13/2018 1620   ALBUMIN 4.2 06/09/2012 1509   AST 31 04/07/2019 1530   AST 17 06/09/2012 1509   ALT 15 04/07/2019 1530   ALT 11 06/09/2012 1509   ALKPHOS 56 04/07/2019 1530   ALKPHOS 47 06/09/2012 1509   BILITOT 1.4 (H) 04/07/2019 1530   BILITOT 1.1 01/13/2018 1620   BILITOT 0.73 06/09/2012 1509   GFRNONAA 65.70 10/20/2019 0000   GFRAA 76.14 10/20/2019 0000   Recent Labs    01/22/19 0405 01/22/19 0405 01/24/19 0432 03/07/19 1454 04/08/19 0021 04/09/19 0456 04/11/19 0556 04/11/19 0556 04/12/19 0536 04/12/19 0536 04/14/19 0532 05/18/19 0000 09/27/19 0000 10/10/19 0000 10/20/19 0000  NA 142   < > 144   < > 144   < > 148*   < > 147*   < > 145   < > 144 146 146  K 3.2*   < > 4.7   < > 3.5   < > 4.3   < > 4.1   < > 3.8   < > 4.7 4.5 4.4  CL 105   < > 112*   < > 107   < > 111   < > 108   < > 109   < > 109* 111* 112*  CO2 27   < > 24   < > 27   < > 27   < > 25   < > 24   < > 22 20 21   GLUCOSE 92   < > 107*   < > 114*   < > 87  --  93  --  103*  --   --   --   --  BUN 24*   < > 25*   < > 41*   < > 31*   < > 29*   < > 26*   <  > 39* 30* 24*  CREATININE 1.41*   < > 1.33*   < > 1.96*   < > 1.28*   < > 1.26*   < > 1.13*   < > 1.1 1.1 0.8  CALCIUM 8.5*   < > 9.4   < > 9.2   < > 9.0   < > 9.3   < > 9.4   < > 8.8 8.9 8.5*  MG 2.1  --  2.0  --  2.2  --   --   --   --   --   --   --   --   --   --   PHOS 3.4  --   --   --  4.0  --   --   --   --   --   --   --   --   --   --    < > = values in this interval not displayed.   Recent Labs    01/20/19 0918 03/07/19 1454 04/07/19 1530  AST 25 17 31   ALT 15 10 15   ALKPHOS 41 43 56  BILITOT 0.7 0.8 1.4*  PROT 5.9* 6.6 6.9  ALBUMIN 3.8 4.1 4.7   Recent Labs    04/07/19 1530 04/07/19 1530 04/08/19 0021 04/08/19 0021 04/12/19 0536 05/18/19 0000 07/14/19 0000 08/11/19 0000 09/07/19 0000  WBC 8.2   < > 6.9   < > 5.3   < > 3.6 5.2 3.4  NEUTROABS 6.5  --   --   --   --    < > 2 4 2   HGB 13.3   < > 12.7   < > 12.9   < > 12.4 12.4 11.9*  HCT 41.2   < > 40.0   < > 42.0   < > 37 37 36  MCV 95.2  --  97.6  --  100.0  --   --   --   --   PLT 75*   < > 76*   < > 70*   < > 60* 69* 63*   < > = values in this interval not displayed.   No results for input(s): CHOL, LDLCALC, TRIG in the last 8760 hours.  Invalid input(s): HCL No results found for: [facility] Lab Results  Component Value Date   TSH 3.58 08/22/2018   Lab Results  Component Value Date   HGBA1C 5.3 09/07/2019   Lab Results  Component Value Date   CHOL 97 08/16/2017   HDL 37.70 (L) 08/16/2017   LDLCALC 48 08/16/2017   TRIG 56.0 08/16/2017   CHOLHDL 3 08/16/2017    Significant Diagnostic Results in last 30 days:  No results found.  Assessment and Plan  ATRIAL FIBRILLATION   Rate control with verapamil 120 mg daily and metoprolol 37.5 mg twice daily and prophylaxed with Coumadin which is actively titrated.  CAD (coronary artery disease) No complaints of chest pain; continue ASA 81 mg daily, metoprolol 37.5 mg twice daily, and Imdur 15 mg daily.  Patient is on statin  Essential  hypertension Chronic and stable; continue metoprolol 37.5 mg twice daily and verapamil ER 120 mg daily      [name], MD

## 2019-10-31 ENCOUNTER — Encounter: Payer: Self-pay | Admitting: Internal Medicine

## 2019-10-31 ENCOUNTER — Non-Acute Institutional Stay (SKILLED_NURSING_FACILITY): Payer: Medicare Other | Admitting: Internal Medicine

## 2019-10-31 DIAGNOSIS — Z7901 Long term (current) use of anticoagulants: Secondary | ICD-10-CM

## 2019-10-31 DIAGNOSIS — I4811 Longstanding persistent atrial fibrillation: Secondary | ICD-10-CM

## 2019-11-02 ENCOUNTER — Encounter: Payer: Self-pay | Admitting: Internal Medicine

## 2019-11-02 ENCOUNTER — Non-Acute Institutional Stay (SKILLED_NURSING_FACILITY): Payer: Medicare Other | Admitting: Internal Medicine

## 2019-11-02 DIAGNOSIS — Z8709 Personal history of other diseases of the respiratory system: Secondary | ICD-10-CM

## 2019-11-02 DIAGNOSIS — Z86711 Personal history of pulmonary embolism: Secondary | ICD-10-CM

## 2019-11-02 DIAGNOSIS — R0602 Shortness of breath: Secondary | ICD-10-CM

## 2019-11-02 DIAGNOSIS — R0902 Hypoxemia: Secondary | ICD-10-CM | POA: Diagnosis not present

## 2019-11-02 NOTE — Progress Notes (Signed)
Location:    [facility] Room Number: 211/D Place of Service:  SNF [phone]) Provider:  Henreitta Leber, MD  Patient Care Team: [name], MD as PCP - General (Internal Medicine)  Extended Emergency Contact Information Primary Emergency Contact: [name],[name] Address: [address]          [place], [place] [address] [name] of [place] Phone: [phone] Mobile Phone: [phone] Relation: Daughter  Code Status:  DNR Goals of care: Advanced Directive information Advanced Directives 11/02/2019  Does Patient Have a Medical Advance Directive? Yes  Type of Advance Directive Out of facility DNR (pink MOST or yellow form)  Does patient want to make changes to medical advance directive? No - Patient declined  Copy of [place] in Chart? -  Would patient like information on creating a medical advance directive? -  Pre-existing out of facility DNR order (yellow form or pink MOST form) Yellow form placed in chart (order not valid for inpatient use)   Chief complaint acute visit secondary to transitory hypoxia shortness of breath    HPI:  Pt is a 86 y.o. female seen today for an acute visit for transitory hypoxia and shortness of breath.  Apparently earlier this afternoon when apparently trying to get dressed-- was a bit anxious --moving about-and then complained of f shortness of breath and had some labored breathing --O2 saturation was taken which was 83%.  She does have a history of COPD and has been on oxygen at times -she was not wearing it at the time but when oxygen was applied apparently O2 sat quickly went up to 96%-and she appeared to be more comfortable labored breathing was of short duration she did not complain of any more shortness of breath  She does not complain of any dizziness syncope chest or arm pain.  When I was able to evaluate her shortly thereafter she appears to be at her baseline  resting in bed comfortably without complaints of shortness of breath or any chest pain or discomfort  Vital signs appear to be stable.  She does have a history of albuterol nebulizers as needed as well as Dulera inhaler twice daily.  Of note she does have a previous history of pulmonary embolism she is on chronic Coumadin INR was therapeutic on April 27 at 2.5 update INR is pending-- currently she is on 2 mg Coumadin a day   Past Medical History:  Diagnosis Date  . Acute encephalopathy   . Acute renal failure superimposed on stage 3 chronic kidney disease ([place])   . Anemia   . Atrial fibrillation ([place])   . Colon cancer ([place]) 12/11  . Colon cancer ([place]) 2011  . Diarrhea   . DVT (deep venous thrombosis) ([place]) 11/06  . Elevated brain natriuretic peptide (BNP) level   . Frequent headaches   . Heart murmur   . Hyperkalemia   . Hypertension   . Pulmonary embolism ([place]) 11/06  . Seizures ([place])   . Stroke ([place]) 11/30/2014  . Urine retention 01/2019   Past Surgical History:  Procedure Laterality Date  . APPENDECTOMY  1952  . COLECTOMY  06/2010   partial, Dr. Donne Hazel complicated by LLE CVT; S/P IVC umbrella & anemia  . HIP FRACTURE SURGERY  2006   Trauma  . TOTAL ABDOMINAL HYSTERECTOMY W/ BILATERAL SALPINGOOPHORECTOMY  1973   For Fibroids  . TOTAL HIP ARTHROPLASTY  01/03/07   Left hip replacement.  Marland Kitchen VENA  CAVA FILTER PLACEMENT  06/28/2010    No Known Allergies  Outpatient Encounter Medications as of 11/02/2019  Medication Sig  . acetaminophen (TYLENOL) 500 MG tablet Take 1,000 mg by mouth every 8 (eight) hours as needed for headache (pain).   Marland Kitchen albuterol (PROVENTIL) (2.5 MG/3ML) 0.083% nebulizer solution Take 2.5 mg by nebulization every 6 (six) hours as needed for wheezing or shortness of breath.  Marland Kitchen albuterol (VENTOLIN HFA) 108 (90 Base) MCG/ACT inhaler Inhale 2 puffs into the lungs every 6 (six) hours as needed for wheezing or shortness of breath.  Marland Kitchen aspirin 81 MG chewable  tablet Chew 81 mg by mouth daily.  Marland Kitchen atorvastatin (LIPITOR) 20 MG tablet Take 1 tablet (20 mg total) by mouth daily.  . Calcium Carbonate-Vitamin D (OYSTER SHELL/VITAMIN D PO) Take 1 tablet by mouth daily. With breakfast  . Ensure (ENSURE) Take 237 mLs by mouth 3 (three) times daily between meals.  Marland Kitchen escitalopram (LEXAPRO) 5 MG tablet Take 5 mg by mouth daily.  . isosorbide mononitrate (IMDUR) 30 MG 24 hr tablet Take 15 mg by mouth daily.  Marland Kitchen loratadine (CLARITIN) 10 MG tablet Take 1 tablet (10 mg total) by mouth daily as needed (seasonal allergies).  . metoprolol tartrate (LOPRESSOR) 25 MG tablet Take 1.5 tablets (37.5 mg total) by mouth 2 (two) times daily.  . mometasone-formoterol (DULERA) 100-5 MCG/ACT AERO Inhale 2 puffs into the lungs 2 (two) times daily. Gargle and rinse your mouth with water after each use of this medication to help prevent dryness,  . Multiple Vitamin (MULTIVITAMIN WITH MINERALS) TABS tablet Take 1 tablet by mouth daily.  . NON FORMULARY Diet order: Regular liberalized diet d/t poor intake.  . polyethylene glycol (MIRALAX / GLYCOLAX) packet Take 17 g by mouth daily as needed.  . promethazine (PHENERGAN) 12.5 MG tablet Take 12.5 mg by mouth 3 (three) times daily as needed for nausea or vomiting.  Marland Kitchen QUEtiapine (SEROQUEL) 25 MG tablet Take 25 mg by mouth at bedtime.  . tamsulosin (FLOMAX) 0.4 MG CAPS capsule Take 1 capsule (0.4 mg total) by mouth daily.  . verapamil (VERELAN PM) 120 MG 24 hr capsule Take 120 mg by mouth at bedtime.  Marland Kitchen warfarin (COUMADIN) 2 MG tablet Take 2 mg by mouth daily.   No facility-administered encounter medications on file as of 11/02/2019.    Review of Systems   This is somewhat limited secondary patient does have some element of dementia-provided by nursing as well.  In general no complaints of fever chills.  Skin no complaints of diaphoresis rashes or itching.  Head ears eyes nose mouth and throat is not complain of visual changes sore  throat or difficulty swallowing.  Respiratory as noted above did complain of some shortness of breath earlier did not complain of any increased cough or chest congestion-apparently labored breathing was of short duration.  Cardiac is not complaining of any chest pain or increasing edema.  GI does not complain of any abdominal discomfort no nausea vomiting or complaints of diarrhea constipation.  GU no complaints of dysuria.  Musculoskeletal is not really complaining of joint pain at this time.  Neurologic does not plane of dizziness headache numbness or increased weakness from baseline.  And psych does have a history of psychosis and agitation at times but she is pleasant and appropriate on exam today she does not really complain of being anxious at this point-  Immunization History  Administered Date(s) Administered  . Fluad Quad(high Dose 65+) 03/07/2019  . Influenza  Split 03/30/2012, 03/07/2019  . Influenza Whole 04/30/2006, 04/20/2008, 04/15/2009  . Influenza, High Dose Seasonal PF 04/20/2013, 03/27/2015, 03/31/2016, 04/22/2017, 04/20/2018  . Influenza,inj,Quad PF,6+ Mos 05/23/2014  . Moderna SARS-COVID-2 Vaccination 07/06/2019, 08/03/2019  . Pneumococcal Conjugate-13 04/24/2015  . Pneumococcal Polysaccharide-23 07/06/2009  . Td 10/30/2016   Pertinent  Health Maintenance Due  Topic Date Due  . DEXA SCAN  02/27/2020 (Originally 11/26/1993)  . INFLUENZA VACCINE  02/04/2020  . PNA vac Low Risk Adult  Completed   Fall Risk  07/22/2018 01/18/2018 10/30/2016 08/14/2015 04/24/2015  Falls in the past year? 1 No No No No  Number falls in past yr: 1 - - - -  Injury with Fall? 0 - - - -   Functional Status Survey:    Vitals:   11/02/19 1617  BP: 115/75  Pulse: 69  Resp: 19  Temp: 98 F (36.7 C)  TempSrc: Oral  SpO2: 96%  Weight: 155 lb (70.3 kg)  Height: 5\' 3"  (1.6 m)   Body mass index is 27.46 kg/m. Physical Exam In general this is a pleasant elderly female in no distress  lying comfortably in bed she appears to be at her baseline no sign of distress.  Her skin is warm and dry.  Eyes visual acuity appears to be intact sclera and conjunctive are clear.  Oropharynx is clear mucous membranes moist.  Chest is clear to auscultation generally there is just a very minimal expiratory wheeze in the right upper lung field there is no labored breathing  Heart is irregular irregular rate and rhythm with baseline systolic murmur with continued mild baseline right lower extremity edema.  Abdomen is soft nontender with positive bowel sounds.  Musculoskeletal appears to be at her baseline is able to move all extremities x4 with diffuse arthritic changes of her hands and knees.  Neurologic appears grossly intact her speech is clear cannot really appreciate true lateralizing findings.  Psych she is pleasant and appropriate appears to be at her baseline Labs reviewed: Recent Labs    01/22/19 0405 01/22/19 0405 01/24/19 0432 03/07/19 1454 04/08/19 0021 04/09/19 0456 04/11/19 0556 04/11/19 0556 04/12/19 0536 04/12/19 0536 04/14/19 0532 05/18/19 0000 09/27/19 0000 10/10/19 0000 10/20/19 0000  NA 142   < > 144   < > 144   < > 148*   < > 147*   < > 145   < > 144 146 146  K 3.2*   < > 4.7   < > 3.5   < > 4.3   < > 4.1   < > 3.8   < > 4.7 4.5 4.4  CL 105   < > 112*   < > 107   < > 111   < > 108   < > 109   < > 109* 111* 112*  CO2 27   < > 24   < > 27   < > 27   < > 25   < > 24   < > 22 20 21   GLUCOSE 92   < > 107*   < > 114*   < > 87  --  93  --  103*  --   --   --   --   BUN 24*   < > 25*   < > 41*   < > 31*   < > 29*   < > 26*   < > 39* 30* 24*  CREATININE 1.41*   < > 1.33*   < >  1.96*   < > 1.28*   < > 1.26*   < > 1.13*   < > 1.1 1.1 0.8  CALCIUM 8.5*   < > 9.4   < > 9.2   < > 9.0   < > 9.3   < > 9.4   < > 8.8 8.9 8.5*  MG 2.1  --  2.0  --  2.2  --   --   --   --   --   --   --   --   --   --   PHOS 3.4  --   --   --  4.0  --   --   --   --   --   --   --   --    --   --    < > = values in this interval not displayed.   Recent Labs    01/20/19 0918 03/07/19 1454 04/07/19 1530  AST 25 17 31   ALT 15 10 15   ALKPHOS 41 43 56  BILITOT 0.7 0.8 1.4*  PROT 5.9* 6.6 6.9  ALBUMIN 3.8 4.1 4.7   Recent Labs    04/07/19 1530 04/07/19 1530 04/08/19 0021 04/08/19 0021 04/12/19 0536 05/18/19 0000 07/14/19 0000 08/11/19 0000 09/07/19 0000  WBC 8.2   < > 6.9   < > 5.3   < > 3.6 5.2 3.4  NEUTROABS 6.5  --   --   --   --    < > 2 4 2   HGB 13.3   < > 12.7   < > 12.9   < > 12.4 12.4 11.9*  HCT 41.2   < > 40.0   < > 42.0   < > 37 37 36  MCV 95.2  --  97.6  --  100.0  --   --   --   --   PLT 75*   < > 76*   < > 70*   < > 60* 69* 63*   < > = values in this interval not displayed.   Lab Results  Component Value Date   TSH 3.58 08/22/2018   Lab Results  Component Value Date   HGBA1C 5.3 09/07/2019   Lab Results  Component Value Date   CHOL 97 08/16/2017   HDL 37.70 (L) 08/16/2017   LDLCALC 48 08/16/2017   TRIG 56.0 08/16/2017   CHOLHDL 3 08/16/2017    Significant Diagnostic Results in last 30 days:  No results found.  Assessment/Plan #1 transitory hypoxia-with shortness of breath labored breathing-this may have been somewhat anxiety related-appear to be quite transitory and when oxygen was applied oxygen saturation rose quickly-.  She does have a history of COPD and has required oxygen at times.  At this point will order a two-view chest x-ray to rule out possible pulmonary etiology---I do note she does have a history of diastolic CHF but this is been stable for some time despite being off the diuretic -----  also monitor vital signs pulse ox every shift for 72 hours to keep an eye on her status-she does have orders for as needed albuterol nebulizers every 6 hours and continues on Dulera inhaler twice daily routine.  At this point she appears to be back at her baseline  #2 history of pulmonary embolism-she is on Coumadin INR has been  therapeutic was 2.5 on April 27.  She is not complaining of any chest pain and no longer complaining of any shortness  of breath-at this point will monitor closely with vital signs and pulse ox for any changes.  [id number]

## 2019-11-04 ENCOUNTER — Encounter: Payer: Self-pay | Admitting: Internal Medicine

## 2019-11-04 NOTE — Assessment & Plan Note (Signed)
Chronic and stable; continue metoprolol 37.5 mg twice daily and verapamil ER 120 mg daily

## 2019-11-04 NOTE — Assessment & Plan Note (Addendum)
No complaints of chest pain; continue ASA 81 mg daily, metoprolol 37.5 mg twice daily, and Imdur 15 mg daily.  Patient is on statin

## 2019-11-04 NOTE — Assessment & Plan Note (Addendum)
Rate control with verapamil 120 mg daily and metoprolol 37.5 mg twice daily and prophylaxed with Coumadin which is actively titrated.

## 2019-11-04 NOTE — Progress Notes (Signed)
Location:  [facility] Room Number: 211-D Place of Service:  SNF (31)  [name], MD  Patient Care Team: [name], MD as PCP - General (Internal Medicine)  Extended Emergency Contact Information Primary Emergency Contact: [facility] Address: [address]          [place], [place] [address] [name] of [place] Phone: [phone] Mobile Phone: [phone] Relation: Daughter    Allergies: Patient has no known allergies.  Chief Complaint  Patient presents with  . Anticoagulation    Patient is seen for Coumadin management.     HPI: Patient is 86 y.o. female who is being seen for an INR check for atrial fibrillation.  Today patient's INR is 2.5 which is therapeutic.  Past Medical History:  Diagnosis Date  . Acute encephalopathy   . Acute renal failure superimposed on stage 3 chronic kidney disease ([place])   . Anemia   . Atrial fibrillation ([place])   . Colon cancer ([place]) 12/11  . Colon cancer ([place]) 2011  . Diarrhea   . DVT (deep venous thrombosis) ([place]) 11/06  . Elevated brain natriuretic peptide (BNP) level   . Frequent headaches   . Heart murmur   . Hyperkalemia   . Hypertension   . Pulmonary embolism ([place]) 11/06  . Seizures ([place])   . Stroke ([place]) 11/30/2014  . Urine retention 01/2019    Past Surgical History:  Procedure Laterality Date  . APPENDECTOMY  1952  . COLECTOMY  06/2010   partial, Dr. Donne Hazel complicated by LLE CVT; S/P IVC umbrella & anemia  . HIP FRACTURE SURGERY  2006   Trauma  . TOTAL ABDOMINAL HYSTERECTOMY W/ BILATERAL SALPINGOOPHORECTOMY  1973   For Fibroids  . TOTAL HIP ARTHROPLASTY  01/03/07   Left hip replacement.  Marland Kitchen VENA CAVA FILTER PLACEMENT  06/28/2010    Allergies as of 10/31/2019   No Known Allergies     Medication List       Accurate as of October 31, 2019 11:59 PM. If you have any questions, ask your nurse or doctor.        acetaminophen 500 MG tablet Commonly  known as: TYLENOL Take 1,000 mg by mouth every 8 (eight) hours as needed for headache (pain).   albuterol (2.5 MG/3ML) 0.083% nebulizer solution Commonly known as: PROVENTIL Take 2.5 mg by nebulization every 6 (six) hours as needed for wheezing or shortness of breath.   albuterol 108 (90 Base) MCG/ACT inhaler Commonly known as: VENTOLIN HFA Inhale 2 puffs into the lungs every 6 (six) hours as needed for wheezing or shortness of breath.   aspirin 81 MG chewable tablet Chew 81 mg by mouth daily.   atorvastatin 20 MG tablet Commonly known as: LIPITOR Take 1 tablet (20 mg total) by mouth daily.   Ensure Take 237 mLs by mouth 3 (three) times daily between meals.   escitalopram 5 MG tablet Commonly known as: LEXAPRO Take 5 mg by mouth daily.   isosorbide mononitrate 30 MG 24 hr tablet Commonly known as: IMDUR Take 15 mg by mouth daily.   loratadine 10 MG tablet Commonly known as: CLARITIN Take 1 tablet (10 mg total) by mouth daily as needed (seasonal allergies).   metoprolol tartrate 25 MG tablet Commonly known as: LOPRESSOR Take 1.5 tablets (37.5 mg total) by mouth 2 (two) times daily.   mometasone-formoterol 100-5 MCG/ACT Aero Commonly known as: DULERA Inhale 2 puffs into the lungs 2 (two) times daily. Gargle and rinse  your mouth with water after each use of this medication to help prevent dryness,   multivitamin with minerals Tabs tablet Take 1 tablet by mouth daily.   NON FORMULARY Diet order: Regular liberalized diet d/t poor intake.   OYSTER SHELL/VITAMIN D PO Take 1 tablet by mouth daily. With breakfast   polyethylene glycol 17 g packet Commonly known as: MIRALAX / GLYCOLAX Take 17 g by mouth daily as needed.   promethazine 12.5 MG tablet Commonly known as: PHENERGAN Take 12.5 mg by mouth 3 (three) times daily as needed for nausea or vomiting.   QUEtiapine 25 MG tablet Commonly known as: SEROQUEL Take 25 mg by mouth at bedtime.   tamsulosin 0.4 MG Caps  capsule Commonly known as: FLOMAX Take 1 capsule (0.4 mg total) by mouth daily.   verapamil 120 MG 24 hr capsule Commonly known as: VERELAN PM Take 120 mg by mouth at bedtime.       No orders of the defined types were placed in this encounter.   Immunization History  Administered Date(s) Administered  . Fluad Quad(high Dose 65+) 03/07/2019  . Influenza Split 03/30/2012, 03/07/2019  . Influenza Whole 04/30/2006, 04/20/2008, 04/15/2009  . Influenza, High Dose Seasonal PF 04/20/2013, 03/27/2015, 03/31/2016, 04/22/2017, 04/20/2018  . Influenza,inj,Quad PF,6+ Mos 05/23/2014  . Moderna SARS-COVID-2 Vaccination 07/06/2019, 08/03/2019  . Pneumococcal Conjugate-13 04/24/2015  . Pneumococcal Polysaccharide-23 07/06/2009  . Td 10/30/2016    Social History   Tobacco Use  . Smoking status: Passive Smoke Exposure - Never Smoker  . Smokeless tobacco: Never Used  . Tobacco comment: husband smoked in home.   Substance Use Topics  . Alcohol use: No    Alcohol/week: 0.0 standard drinks     Vitals:   10/31/19 1625  BP: 132/65  Pulse: 72  Resp: 18  Temp: 97.6 F (36.4 C)   Body mass index is 27.46 kg/m.   Patient Active Problem List   Diagnosis Date Noted  . Altered behavior 05/10/2019  . Klebsiella cystitis 04/22/2019  . Metabolic encephalopathy [id number]  . Back pain 04/08/2019  . MCI (mild cognitive impairment) 03/07/2019  . Congestive heart failure (CHF) ([place]) 01/20/2019  . CAD (coronary artery disease) 06/07/2018  . Pulmonary hypertension, primary ([place]) 06/07/2018  . Generalized weakness   . Abnormal LFTs 06/01/2018  . Hallucination 06/01/2018  . Abnormal chest x-ray 09/28/2015  . Edema of right lower extremity 09/28/2015  . Elevated brain natriuretic peptide (BNP) level 09/17/2015  . Seizures ([place]) 09/16/2015  . Supratherapeutic INR 08/30/2015  . Asthma 08/14/2015  . PCP NOTES >>>>>>>>>>>>>>>>>>>>>>>>>>>>>> 04/24/2015  . TIA (transient ischemic attack)  12/11/2014  . Dyslipidemia 12/11/2014  . History of CVA (cerebrovascular accident) 12/01/2014  . Thrombocytopenia ([place]) 12/01/2014  . Chronic renal insufficiency, stage II (mild) 11/12/2013  . Impingement syndrome, shoulder, left 11/09/2013  . Acquired short bowel syndrome 11/09/2013  . Annual physical exam 08/30/2013  . Hx pulmonary embolism 06/27/2013  . Warfarin anticoagulation 06/27/2013  . DDD (degenerative disc disease), lumbar 11/18/2012  . ATRIAL FIBRILLATION   07/08/2010  . COLON CANCER, HX OF 07/08/2010  . DIZZINESS 10/18/2009  . Essential hypertension 12/16/2007  . HIP PAIN, CHRONIC 12/28/2006  . ANEMIA-NOS 07/15/2006  . Personal history of venous thrombosis and embolism 07/15/2006    CMP     Component Value Date/Time   NA 146 10/20/2019 0000   NA 142 06/09/2012 1509   K 4.4 10/20/2019 0000   K 3.9 06/09/2012 1509   CL 112 (A) 10/20/2019 0000  CL 108 (H) 06/09/2012 1509   CO2 21 10/20/2019 0000   CO2 26 06/09/2012 1509   GLUCOSE 103 (H) 04/14/2019 0532   GLUCOSE 103 (H) 06/09/2012 1509   BUN 24 (A) 10/20/2019 0000   BUN 17.0 06/09/2012 1509   CREATININE 0.8 10/20/2019 0000   CREATININE 1.13 (H) 04/14/2019 0532   CREATININE 1.51 (H) 07/22/2018 1624   CREATININE 1.2 (H) 06/09/2012 1509   CALCIUM 8.5 (A) 10/20/2019 0000   CALCIUM 9.5 06/09/2012 1509   PROT 6.9 04/07/2019 1530   PROT 6.9 01/13/2018 1620   PROT 7.2 06/09/2012 1509   ALBUMIN 4.7 04/07/2019 1530   ALBUMIN 4.6 01/13/2018 1620   ALBUMIN 4.2 06/09/2012 1509   AST 31 04/07/2019 1530   AST 17 06/09/2012 1509   ALT 15 04/07/2019 1530   ALT 11 06/09/2012 1509   ALKPHOS 56 04/07/2019 1530   ALKPHOS 47 06/09/2012 1509   BILITOT 1.4 (H) 04/07/2019 1530   BILITOT 1.1 01/13/2018 1620   BILITOT 0.73 06/09/2012 1509   GFRNONAA 65.70 10/20/2019 0000   GFRAA 76.14 10/20/2019 0000   Recent Labs    01/22/19 0405 01/22/19 0405 01/24/19 0432 03/07/19 1454 04/08/19 0021 04/09/19 0456 04/11/19 0556  04/11/19 0556 04/12/19 0536 04/12/19 0536 04/14/19 0532 05/18/19 0000 09/27/19 0000 10/10/19 0000 10/20/19 0000  NA 142   < > 144   < > 144   < > 148*   < > 147*   < > 145   < > 144 146 146  K 3.2*   < > 4.7   < > 3.5   < > 4.3   < > 4.1   < > 3.8   < > 4.7 4.5 4.4  CL 105   < > 112*   < > 107   < > 111   < > 108   < > 109   < > 109* 111* 112*  CO2 27   < > 24   < > 27   < > 27   < > 25   < > 24   < > 22 20 21   GLUCOSE 92   < > 107*   < > 114*   < > 87  --  93  --  103*  --   --   --   --   BUN 24*   < > 25*   < > 41*   < > 31*   < > 29*   < > 26*   < > 39* 30* 24*  CREATININE 1.41*   < > 1.33*   < > 1.96*   < > 1.28*   < > 1.26*   < > 1.13*   < > 1.1 1.1 0.8  CALCIUM 8.5*   < > 9.4   < > 9.2   < > 9.0   < > 9.3   < > 9.4   < > 8.8 8.9 8.5*  MG 2.1  --  2.0  --  2.2  --   --   --   --   --   --   --   --   --   --   PHOS 3.4  --   --   --  4.0  --   --   --   --   --   --   --   --   --   --    < > = values in this  interval not displayed.   Recent Labs    01/20/19 0918 03/07/19 1454 04/07/19 1530  AST 25 17 31   ALT 15 10 15   ALKPHOS 41 43 56  BILITOT 0.7 0.8 1.4*  PROT 5.9* 6.6 6.9  ALBUMIN 3.8 4.1 4.7   Recent Labs    04/07/19 1530 04/07/19 1530 04/08/19 0021 04/08/19 0021 04/12/19 0536 05/18/19 0000 07/14/19 0000 08/11/19 0000 09/07/19 0000  WBC 8.2   < > 6.9   < > 5.3   < > 3.6 5.2 3.4  NEUTROABS 6.5  --   --   --   --    < > 2 4 2   HGB 13.3   < > 12.7   < > 12.9   < > 12.4 12.4 11.9*  HCT 41.2   < > 40.0   < > 42.0   < > 37 37 36  MCV 95.2  --  97.6  --  100.0  --   --   --   --   PLT 75*   < > 76*   < > 70*   < > 60* 69* 63*   < > = values in this interval not displayed.   No results for input(s): CHOL, LDLCALC, TRIG in the last 8760 hours.  Invalid input(s): HCL No results found for: [facility] Lab Results  Component Value Date   TSH 3.58 08/22/2018   Lab Results  Component Value Date   HGBA1C 5.3 09/07/2019   Lab Results  Component Value Date    CHOL 97 08/16/2017   HDL 37.70 (L) 08/16/2017   LDLCALC 48 08/16/2017   TRIG 56.0 08/16/2017   CHOLHDL 3 08/16/2017    Significant Diagnostic Results in last 30 days:  No results found.  Assessment and Plan  Atrial fibrillation/anticoagulated on Coumadin-today patient's INR is 2.5.  Coumadin has been held for supratherapeutic INR when she was on Coumadin 2.5 mg Monday Wednesday which was decreased from 2.5 mg Monday Wednesday Friday and on 2 mg Tuesday Thursday Saturday and Sunday.  We will start Coumadin 2.5 mg daily and repeat INR in 1 week    [name] , MD

## 2019-11-07 ENCOUNTER — Non-Acute Institutional Stay (SKILLED_NURSING_FACILITY): Payer: Medicare Other | Admitting: Internal Medicine

## 2019-11-07 ENCOUNTER — Encounter: Payer: Self-pay | Admitting: Internal Medicine

## 2019-11-07 DIAGNOSIS — Z7901 Long term (current) use of anticoagulants: Secondary | ICD-10-CM

## 2019-11-07 DIAGNOSIS — E870 Hyperosmolality and hypernatremia: Secondary | ICD-10-CM

## 2019-11-07 DIAGNOSIS — Z5181 Encounter for therapeutic drug level monitoring: Secondary | ICD-10-CM | POA: Diagnosis not present

## 2019-11-07 DIAGNOSIS — I4891 Unspecified atrial fibrillation: Secondary | ICD-10-CM | POA: Diagnosis not present

## 2019-11-07 DIAGNOSIS — Z8709 Personal history of other diseases of the respiratory system: Secondary | ICD-10-CM

## 2019-11-07 NOTE — Progress Notes (Signed)
Location:    [facility] Room Number: 211/D Place of Service:  SNF [phone]) Provider:  Henreitta Leber, MD  Patient Care Team: [name], MD as PCP - General (Internal Medicine)  Extended Emergency Contact Information Primary Emergency Contact: [name],[name] Address: [address]          [place], [place] [address] [name] of [place] Phone: [phone] Mobile Phone: [phone] Relation: Daughter  Code Status:  DNR Goals of care: Advanced Directive information Advanced Directives 11/07/2019  Does Patient Have a Medical Advance Directive? Yes  Type of Advance Directive Out of facility DNR (pink MOST or yellow form)  Does patient want to make changes to medical advance directive? No - Patient declined  Copy of [place] in Chart? -  Would patient like information on creating a medical advance directive? -  Pre-existing out of facility DNR order (yellow form or pink MOST form) Yellow form placed in chart (order not valid for inpatient use)     Chief Complaint  Patient presents with  . Anticoagulation    Anticoagulation Management    HPI:  Pt is a 86 y.o. female seen today for an acute visit for anticoagulation management-she is on chronic Coumadin with a history of atrial fibrillation as well as previous history of DVT and pulmonary embolism.  INR today is 2.2 she is currently on 2 mg of Coumadin a day-last week she was on 2 mg a day and INR was 2.5.  There has been no evidence of increased bruising or bleeding.  She appears to be stable in this regards.  I also saw her recently for transitory shortness of breath and hypoxia-there has been no reoccurrence-it was suspected this was more anxiety exertion related.  Chest x-ray did not show any acute process.  She also has a history of mild hypernatremia-staff has been encouraging fluids strongly and this appears to be stable last sodium was  146 which has been her relative baseline recently-maintaining an IV in the past has been quite difficult secondary to patient agitation and not keeping the IV in.  Currently she is resting in bed comfortably she has no complaints   Past Medical History:  Diagnosis Date  . Acute encephalopathy   . Acute renal failure superimposed on stage 3 chronic kidney disease ([place])   . Anemia   . Atrial fibrillation ([place])   . Colon cancer ([place]) 12/11  . Colon cancer ([place]) 2011  . Diarrhea   . DVT (deep venous thrombosis) ([place]) 11/06  . Elevated brain natriuretic peptide (BNP) level   . Frequent headaches   . Heart murmur   . Hyperkalemia   . Hypertension   . Pulmonary embolism ([place]) 11/06  . Seizures ([place])   . Stroke ([place]) 11/30/2014  . Urine retention 01/2019   Past Surgical History:  Procedure Laterality Date  . APPENDECTOMY  1952  . COLECTOMY  06/2010   partial, Dr. Donne Hazel complicated by LLE CVT; S/P IVC umbrella & anemia  . HIP FRACTURE SURGERY  2006   Trauma  . TOTAL ABDOMINAL HYSTERECTOMY W/ BILATERAL SALPINGOOPHORECTOMY  1973   For Fibroids  . TOTAL HIP ARTHROPLASTY  01/03/07   Left hip replacement.  Marland Kitchen VENA CAVA FILTER PLACEMENT  06/28/2010    No Known Allergies  Outpatient Encounter Medications as of 11/07/2019  Medication Sig  . acetaminophen (TYLENOL) 500 MG tablet Take 1,000 mg by mouth every 8 (eight) hours as needed  for headache (pain).   Marland Kitchen albuterol (PROVENTIL) (2.5 MG/3ML) 0.083% nebulizer solution Take 2.5 mg by nebulization every 6 (six) hours as needed for wheezing or shortness of breath.  Marland Kitchen albuterol (VENTOLIN HFA) 108 (90 Base) MCG/ACT inhaler Inhale 2 puffs into the lungs every 6 (six) hours as needed for wheezing or shortness of breath.  Marland Kitchen aspirin 81 MG chewable tablet Chew 81 mg by mouth daily.  Marland Kitchen atorvastatin (LIPITOR) 20 MG tablet Take 1 tablet (20 mg total) by mouth daily.  . Calcium Carbonate-Vitamin D (OYSTER SHELL/VITAMIN D PO) Take 1 tablet by mouth  daily. With breakfast  . Ensure (ENSURE) Take 237 mLs by mouth 3 (three) times daily between meals.  Marland Kitchen escitalopram (LEXAPRO) 5 MG tablet Take 5 mg by mouth daily.  . isosorbide mononitrate (IMDUR) 30 MG 24 hr tablet Take 15 mg by mouth daily.  Marland Kitchen loratadine (CLARITIN) 10 MG tablet Take 1 tablet (10 mg total) by mouth daily as needed (seasonal allergies).  . metoprolol tartrate (LOPRESSOR) 25 MG tablet Take 1.5 tablets (37.5 mg total) by mouth 2 (two) times daily.  . mometasone-formoterol (DULERA) 100-5 MCG/ACT AERO Inhale 2 puffs into the lungs 2 (two) times daily. Gargle and rinse your mouth with water after each use of this medication to help prevent dryness,  . Multiple Vitamin (MULTIVITAMIN WITH MINERALS) TABS tablet Take 1 tablet by mouth daily.  . NON FORMULARY Diet order: Regular liberalized diet d/t poor intake.  . polyethylene glycol (MIRALAX / GLYCOLAX) packet Take 17 g by mouth daily as needed.  . promethazine (PHENERGAN) 12.5 MG tablet Take 12.5 mg by mouth 3 (three) times daily as needed for nausea or vomiting.  Marland Kitchen QUEtiapine (SEROQUEL) 25 MG tablet Take 25 mg by mouth at bedtime.  . tamsulosin (FLOMAX) 0.4 MG CAPS capsule Take 1 capsule (0.4 mg total) by mouth daily.  . verapamil (VERELAN PM) 120 MG 24 hr capsule Take 120 mg by mouth at bedtime.  Marland Kitchen warfarin (COUMADIN) 2 MG tablet Take 2 mg by mouth daily.   No facility-administered encounter medications on file as of 11/07/2019.    Review of Systems.  This is limited secondary to dementia nursing does not report any concerns.  General she not complaining of any fever or chills.  Skin no complaints of increased rashes or bruising or bleeding.  Head ears eyes nose mouth and throat is not complain of visual changes or sore throat.  Respiratory does not complain of being short of breath or having a cough.  Cardiac does not complain of chest pain or increased edema from baseline.  GI is not complaining of abdominal pain nausea  vomiting diarrhea constipation.  GU no complaints of dysuria.  Musculoskeletal does have lower extremity weakness but is not complaining of pain.  Neurologic does not complain of dizziness headache numbness syncope.  Psych does not complain of being depressed or anxious--- at times does have some agitation per staff but this has not increased recently  Immunization History  Administered Date(s) Administered  . Fluad Quad(high Dose 65+) 03/07/2019  . Influenza Split 03/30/2012, 03/07/2019  . Influenza Whole 04/30/2006, 04/20/2008, 04/15/2009  . Influenza, High Dose Seasonal PF 04/20/2013, 03/27/2015, 03/31/2016, 04/22/2017, 04/20/2018  . Influenza,inj,Quad PF,6+ Mos 05/23/2014  . Moderna SARS-COVID-2 Vaccination 07/06/2019, 08/03/2019  . Pneumococcal Conjugate-13 04/24/2015  . Pneumococcal Polysaccharide-23 07/06/2009  . Td 10/30/2016   Pertinent  Health Maintenance Due  Topic Date Due  . DEXA SCAN  02/27/2020 (Originally 11/26/1993)  . INFLUENZA VACCINE  02/04/2020  . PNA vac Low Risk Adult  Completed   Fall Risk  07/22/2018 01/18/2018 10/30/2016 08/14/2015 04/24/2015  Falls in the past year? 1 No No No No  Number falls in past yr: 1 - - - -  Injury with Fall? 0 - - - -   Functional Status Survey:    Vitals:   11/07/19 1458  BP: 112/69  Pulse: 75  Resp: 19  Temp: (!) 97.5 F (36.4 C)  TempSrc: Oral  SpO2: 96%  Weight: 156 lb 3.2 oz (70.9 kg)  Height: 5\' 3"  (1.6 m)   Body mass index is 27.67 kg/m. Physical Exam   In general this is a pleasant elderly female in no distress.  Her skin is warm and dry I do not see evidence of increased bruising or bleeding.  Eyes visual acuity appears to be intact sclera and conjunctive are clear.  Oropharynx is clear mucous membranes moist.  Chest is clear to auscultation with somewhat shallow air entry there is no labored breathing.  Heart is irregular irregular rate and rhythm with a baseline systolic murmur she has some mild  lower extremity edema which appears relatively baseline this is a bit more on the right versus the left which is chronic.  Abdomen is soft nontender with positive bowel sounds.  Musculoskeletal Limited exam but she is able to move all extremities x4 has baseline arthritic changes of her extremities.  Neurologic is grossly intact cannot appreciate lateralizing findings her speech is clear.  Psych she is oriented to self she is pleasant appropriate-follows simple verbal commands without difficulty  Labs reviewed:  Nov 07, 2019.  INR 2.2.  October 30, 2019-INR 2.5.  October 20, 2019.  Sodium 146 potassium 4.4 BUN 23.9 creatinine 0.79.  September 07, 2019.  WBC 3.4 hemoglobin 11.9 platelets 63,000.     Recent Labs    01/22/19 0405 01/22/19 0405 01/24/19 0432 03/07/19 1454 04/08/19 0021 04/09/19 0456 04/11/19 0556 04/11/19 0556 04/12/19 0536 04/12/19 0536 04/14/19 0532 05/18/19 0000 09/27/19 0000 10/10/19 0000 10/20/19 0000  NA 142   < > 144   < > 144   < > 148*   < > 147*   < > 145   < > 144 146 146  K 3.2*   < > 4.7   < > 3.5   < > 4.3   < > 4.1   < > 3.8   < > 4.7 4.5 4.4  CL 105   < > 112*   < > 107   < > 111   < > 108   < > 109   < > 109* 111* 112*  CO2 27   < > 24   < > 27   < > 27   < > 25   < > 24   < > 22 20 21   GLUCOSE 92   < > 107*   < > 114*   < > 87  --  93  --  103*  --   --   --   --   BUN 24*   < > 25*   < > 41*   < > 31*   < > 29*   < > 26*   < > 39* 30* 24*  CREATININE 1.41*   < > 1.33*   < > 1.96*   < > 1.28*   < > 1.26*   < > 1.13*   < > 1.1 1.1 0.8  CALCIUM 8.5*   < > 9.4   < > 9.2   < > 9.0   < > 9.3   < > 9.4   < > 8.8 8.9 8.5*  MG 2.1  --  2.0  --  2.2  --   --   --   --   --   --   --   --   --   --   PHOS 3.4  --   --   --  4.0  --   --   --   --   --   --   --   --   --   --    < > = values in this interval not displayed.   Recent Labs    01/20/19 0918 03/07/19 1454 04/07/19 1530  AST 25 17 31   ALT 15 10 15   ALKPHOS 41 43 56  BILITOT 0.7  0.8 1.4*  PROT 5.9* 6.6 6.9  ALBUMIN 3.8 4.1 4.7   Recent Labs    04/07/19 1530 04/07/19 1530 04/08/19 0021 04/08/19 0021 04/12/19 0536 05/18/19 0000 07/14/19 0000 08/11/19 0000 09/07/19 0000  WBC 8.2   < > 6.9   < > 5.3   < > 3.6 5.2 3.4  NEUTROABS 6.5  --   --   --   --    < > 2 4 2   HGB 13.3   < > 12.7   < > 12.9   < > 12.4 12.4 11.9*  HCT 41.2   < > 40.0   < > 42.0   < > 37 37 36  MCV 95.2  --  97.6  --  100.0  --   --   --   --   PLT 75*   < > 76*   < > 70*   < > 60* 69* 63*   < > = values in this interval not displayed.   Lab Results  Component Value Date   TSH 3.58 08/22/2018   Lab Results  Component Value Date   HGBA1C 5.3 09/07/2019   Lab Results  Component Value Date   CHOL 97 08/16/2017   HDL 37.70 (L) 08/16/2017   LDLCALC 48 08/16/2017   TRIG 56.0 08/16/2017   CHOLHDL 3 08/16/2017    Significant Diagnostic Results in last 30 days:  No results found.  Assessment/Plan   #1 anticoagulation management with history of atrial fibrillation as well as pulmonary embolism and DVT-INR is therapeutic at 2.2 currently on 2 mg of Coumadin a day-will continue current dose and recheck PT/INR in 1 week I do not see any evidence of increased bruising or bleeding.  Atrial fibrillation appears rate controlled on verapamil as well as Lopressor.  2.  History of COPD she does have orders for [facility] daily also nebulizers as needed-it appears this is stable-I did see her recently for a short episode of hypoxia again this was thought to be transitory secondary to anxiety and exertion-chest x-ray was reassuring.  3.  History of mild hypernatremia-at this point appears stable staff is encouraging fluids will update a BMP.  [id number]

## 2019-11-09 DIAGNOSIS — I10 Essential (primary) hypertension: Secondary | ICD-10-CM | POA: Diagnosis not present

## 2019-11-09 DIAGNOSIS — D649 Anemia, unspecified: Secondary | ICD-10-CM | POA: Diagnosis not present

## 2019-11-09 LAB — BASIC METABOLIC PANEL
BUN: 27 — AB (ref 4–21)
CO2: 22 (ref 13–22)
Chloride: 114 — AB (ref 99–108)
Creatinine: 0.9 (ref 0.5–1.1)
Glucose: 93
Potassium: 4.5 (ref 3.4–5.3)
Sodium: 147 (ref 137–147)

## 2019-11-09 LAB — COMPREHENSIVE METABOLIC PANEL
Calcium: 8.7 (ref 8.7–10.7)
GFR calc Af Amer: 65.02
GFR calc non Af Amer: 56.1

## 2019-11-09 LAB — CBC: RBC: 3.88 (ref 3.87–5.11)

## 2019-11-09 LAB — CBC AND DIFFERENTIAL
HCT: 35 — AB (ref 36–46)
Hemoglobin: 11.2 — AB (ref 12.0–16.0)
WBC: 4.1

## 2019-11-11 ENCOUNTER — Encounter: Payer: Self-pay | Admitting: Internal Medicine

## 2019-11-13 ENCOUNTER — Non-Acute Institutional Stay (SKILLED_NURSING_FACILITY): Payer: Medicare Other | Admitting: Internal Medicine

## 2019-11-13 DIAGNOSIS — E870 Hyperosmolality and hypernatremia: Secondary | ICD-10-CM

## 2019-11-13 DIAGNOSIS — D696 Thrombocytopenia, unspecified: Secondary | ICD-10-CM

## 2019-11-13 DIAGNOSIS — K649 Unspecified hemorrhoids: Secondary | ICD-10-CM | POA: Diagnosis not present

## 2019-11-13 NOTE — Progress Notes (Signed)
This is an acute visit.  Level care skilled.  Facility [facility]  Chief complaint acute visit secondary to hemorrhoids.-Also follow-up hypernatremia and thrombocytopenia  History of present illness.  Patient is a pleasant 86 year old female who is a long-term resident facility-nursing staff business of note feeling that she is having some hemorrhoid discomfort-and would like something for it.  Speaking with patient she does state the setting-saying she has discomfort in her rectum.  She did not complain of any bleeding there has been no noted bleeding by staff to my knowledge.  She does have a history of borderline hypernatremia-she has pulled out IVs in the past so staff has been encouraging fluids strongly instead-this appears to be fairly effective with sodiums running in the mid to higher 140s-her most recent lab is 147 which is relatively her recent baseline.  She also has a history of chronic thrombocytopenia platelet count of 83,000 on lab done on May 6 actually showed some improvement-- she has some chronic bruising but nothing that looks new or acutely concerning  Currently she is resting in bed comfortably vital signs appear to be stable  . Past Medical History:  Diagnosis Date  . Acute encephalopathy   . Acute renal failure superimposed on stage 3 chronic kidney disease ([place])   . Anemia   . Atrial fibrillation ([place])   . Colon cancer ([place]) 12/11  . Colon cancer ([place]) 2011  . Diarrhea   . DVT (deep venous thrombosis) ([place]) 11/06  . Elevated brain natriuretic peptide (BNP) level   . Frequent headaches   . Heart murmur   . Hyperkalemia   . Hypertension   . Pulmonary embolism ([place]) 11/06  . Seizures ([place])   . Stroke ([place]) 11/30/2014  . Urine retention 01/2019        Past Surgical History:  Procedure Laterality Date  . APPENDECTOMY  1952  . COLECTOMY  06/2010   partial, Dr. Donne Hazel complicated by LLE CVT; S/P IVC umbrella & anemia  . HIP  FRACTURE SURGERY  2006   Trauma  . TOTAL ABDOMINAL HYSTERECTOMY W/ BILATERAL SALPINGOOPHORECTOMY  1973   For Fibroids  . TOTAL HIP ARTHROPLASTY  01/03/07   Left hip replacement.  Marland Kitchen VENA CAVA FILTER PLACEMENT  06/28/2010    No Known Allergies         Medication Sig  . acetaminophen (TYLENOL) 500 MG tablet Take 1,000 mg by mouth every 8 (eight) hours as needed for headache (pain).   Marland Kitchen albuterol (PROVENTIL) (2.5 MG/3ML) 0.083% nebulizer solution Take 2.5 mg by nebulization every 6 (six) hours as needed for wheezing or shortness of breath.  Marland Kitchen albuterol (VENTOLIN HFA) 108 (90 Base) MCG/ACT inhaler Inhale 2 puffs into the lungs every 6 (six) hours as needed for wheezing or shortness of breath.  Marland Kitchen aspirin 81 MG chewable tablet Chew 81 mg by mouth daily.  Marland Kitchen atorvastatin (LIPITOR) 20 MG tablet Take 1 tablet (20 mg total) by mouth daily.  . Calcium Carbonate-Vitamin D (OYSTER SHELL/VITAMIN D PO) Take 1 tablet by mouth daily. With breakfast  . Ensure (ENSURE) Take 237 mLs by mouth 3 (three) times daily between meals.  Marland Kitchen escitalopram (LEXAPRO) 5 MG tablet Take 5 mg by mouth daily.  . isosorbide mononitrate (IMDUR) 30 MG 24 hr tablet Take 15 mg by mouth daily.  Marland Kitchen loratadine (CLARITIN) 10 MG tablet Take 1 tablet (10 mg total) by mouth daily as needed (seasonal allergies).  . metoprolol tartrate (LOPRESSOR) 25 MG tablet Take 1.5 tablets (  37.5 mg total) by mouth 2 (two) times daily.  . mometasone-formoterol (DULERA) 100-5 MCG/ACT AERO Inhale 2 puffs into the lungs 2 (two) times daily. Gargle and rinse your mouth with water after each use of this medication to help prevent dryness,  . Multiple Vitamin (MULTIVITAMIN WITH MINERALS) TABS tablet Take 1 tablet by mouth daily.  . NON FORMULARY Diet order: Regular liberalized diet d/t poor intake.  . polyethylene glycol (MIRALAX / GLYCOLAX) packet Take 17 g by mouth daily as needed.  . promethazine (PHENERGAN) 12.5 MG tablet Take 12.5 mg by mouth 3  (three) times daily as needed for nausea or vomiting.  Marland Kitchen QUEtiapine (SEROQUEL) 25 MG tablet Take 25 mg by mouth at bedtime.  . tamsulosin (FLOMAX) 0.4 MG CAPS capsule Take 1 capsule (0.4 mg total) by mouth daily.  . verapamil (VERELAN PM) 120 MG 24 hr capsule Take 120 mg by mouth at bedtime.  Marland Kitchen warfarin (COUMADIN) 2 MG tablet Take 2 mg by mouth daily.   No facility-administered encounter medications on file as of 11/07/2019.    Review of systems.  This is somewhat limited secondary to dementia.  In general there is no complaints of fever or chills.  Skin does not complain of increased bruising or itching.  Head ears eyes nose mouth and throat is not complain of visual changes or sore throat.  Respiratory does not complain of being short of breath at this time or cough she does have a history of COPD had recent hypoxia but this was thought to be exertion related anxiety related and there has not really been any reoccurrence.  Chest x-ray was reassuring.  Cardiac does not complain of chest pain or increasing edema.  GI does not complain of abdominal pain nausea vomiting diarrhea constipation.  GU no complaints of dysuria.  Rectal again does complain of some rectal pain thought to be hemorrhoids.  Musculoskeletal does not complain of joint pain at this time.  Neurologic does not complain of dizziness headache numbness does have chronic lower extremity weakness.  Psych does not complain of being anxious depressed apparently at times she has some agitation with staff and her roommate but whenever I see her she has been quite pleasant and cooperative.    Physical exam.  Temperature is 97.7 pulse 75 respirations 18 blood pressure 112/70.  In general this is a pleasant elderly female no distress lying comfortably in bed.  Skin is warm and dry she does have some mild baseline chronic bruising more so of her upper extremities but this is not new.  Eyes visual acuity appears to  be intact sclera and conjunctive are clear.  Oropharynx clear mucous membranes moist.  Chest is clear to auscultation with somewhat shallow air entry there is no labored breathing.  Heart is regular irregular rate and rhythm with baseline systolic murmur she has mild lower extremity edema bit more on the right versus the left and this is not new.  Abdomen is soft nontender with positive bowel sounds.  Rectal-- I do note an external hemorrhoid-there is no active bleeding  Musculoskeletal Limited exam since she is in bed but appears able to move her extremities at baseline with baseline arthritic changes upper and lower extremities.  Neurologic is grossly intact her speech is clear could not really appreciate lateralizing findings.  Psych she is oriented to self she is pleasant appropriate does follow  verbal commands  Labs.  Nov 09, 2019.  WBC 4.1 hemoglobin 11.2 platelets 83,000.  Sodium 147 potassium  4.5 BUN 26.7 creatinine 0.90.   Nov 07, 2019.  INR 2.2.  October 30, 2019-INR 2.5.  October 20, 2019.  Sodium 146 potassium 4.4 BUN 23.9 creatinine 0.79.  September 07, 2019.  WBC 3.4 hemoglobin 11.9 platelets 63,000.     Recent Labs (within last 365 days)                   Recent Labs    01/22/19 0405 01/22/19 0405 01/24/19 0432 03/07/19 1454 04/08/19 0021 04/09/19 0456 04/11/19 0556 04/11/19 0556 04/12/19 0536 04/12/19 0536 04/14/19 0532 05/18/19 0000 09/27/19 0000 10/10/19 0000 10/20/19 0000  NA 142   < > 144   < > 144   < > 148*   < > 147*   < > 145   < > 144 146 146  K 3.2*   < > 4.7   < > 3.5   < > 4.3   < > 4.1   < > 3.8   < > 4.7 4.5 4.4  CL 105   < > 112*   < > 107   < > 111   < > 108   < > 109   < > 109* 111* 112*  CO2 27   < > 24   < > 27   < > 27   < > 25   < > 24   < > 22 20 21   GLUCOSE 92   < > 107*   < > 114*   < > 87  --  93  --  103*  --   --   --   --   BUN 24*   < > 25*   < > 41*   < > 31*   < > 29*   < > 26*   < > 39* 30* 24*   CREATININE 1.41*   < > 1.33*   < > 1.96*   < > 1.28*   < > 1.26*   < > 1.13*   < > 1.1 1.1 0.8  CALCIUM 8.5*   < > 9.4   < > 9.2   < > 9.0   < > 9.3   < > 9.4   < > 8.8 8.9 8.5*  MG 2.1  --  2.0  --  2.2  --   --   --   --   --   --   --   --   --   --   PHOS 3.4  --   --   --  4.0  --   --   --   --   --   --   --   --   --   --    < > = values in this interval not displayed.     Recent Labs (within last 365 days)       Recent Labs    01/20/19 0918 03/07/19 1454 04/07/19 1530  AST 25 17 31   ALT 15 10 15   ALKPHOS 41 43 56  BILITOT 0.7 0.8 1.4*  PROT 5.9* 6.6 6.9  ALBUMIN 3.8 4.1 4.7     Recent Labs (within last 365 days)  Recent Labs    04/07/19 1530 04/07/19 1530 04/08/19 0021 04/08/19 0021 04/12/19 0536 05/18/19 0000 07/14/19 0000 08/11/19 0000 09/07/19 0000  WBC 8.2   < > 6.9   < > 5.3   < > 3.6 5.2 3.4  NEUTROABS 6.5  --   --   --   --    < >  2 4 2   HGB 13.3   < > 12.7   < > 12.9   < > 12.4 12.4 11.9*  HCT 41.2   < > 40.0   < > 42.0   < > 37 37 36  MCV 95.2  --  97.6  --  100.0  --   --   --   --   PLT 75*   < > 76*   < > 70*   < > 60* 69* 63*   < > = values in this interval not displayed.     Recent Labs    Assessment and plan.  1.  Hemorrhoids-will prescribe Anusol cream twice a day as needed for hemorrhoid discomfort and monitor.  2.  History of hypernatremia-she appears to have some mild borderline hypernatremia-in the past she has been somewhat resistant to IV fluids-pulling out  the IV--staff has been encouraging fluids and this appears to be relatively stabilized with her sodium recently hovering in the mid-- higher 140s-at this point will monitor periodically.  Continue to encourage fluids strongly------  . #3 history of thrombocytopenia she does not show any increased evidence of bruising or bleeding from baseline-platelet count of 83,000 on most recent lab appears relatively stable-actually slightly improved  [id number]

## 2019-11-14 ENCOUNTER — Encounter: Payer: Self-pay | Admitting: Internal Medicine

## 2019-11-22 DIAGNOSIS — F5111 Primary hypersomnia: Secondary | ICD-10-CM | POA: Diagnosis not present

## 2019-11-24 DIAGNOSIS — M6281 Muscle weakness (generalized): Secondary | ICD-10-CM | POA: Diagnosis not present

## 2019-11-24 DIAGNOSIS — G9341 Metabolic encephalopathy: Secondary | ICD-10-CM | POA: Diagnosis not present

## 2019-11-24 DIAGNOSIS — I482 Chronic atrial fibrillation, unspecified: Secondary | ICD-10-CM | POA: Diagnosis not present

## 2019-11-24 DIAGNOSIS — R1310 Dysphagia, unspecified: Secondary | ICD-10-CM | POA: Diagnosis not present

## 2019-11-24 DIAGNOSIS — R2681 Unsteadiness on feet: Secondary | ICD-10-CM | POA: Diagnosis not present

## 2019-11-24 DIAGNOSIS — I129 Hypertensive chronic kidney disease with stage 1 through stage 4 chronic kidney disease, or unspecified chronic kidney disease: Secondary | ICD-10-CM | POA: Diagnosis not present

## 2019-11-27 DIAGNOSIS — I129 Hypertensive chronic kidney disease with stage 1 through stage 4 chronic kidney disease, or unspecified chronic kidney disease: Secondary | ICD-10-CM | POA: Diagnosis not present

## 2019-11-27 DIAGNOSIS — I482 Chronic atrial fibrillation, unspecified: Secondary | ICD-10-CM | POA: Diagnosis not present

## 2019-11-27 DIAGNOSIS — M6281 Muscle weakness (generalized): Secondary | ICD-10-CM | POA: Diagnosis not present

## 2019-11-27 DIAGNOSIS — G9341 Metabolic encephalopathy: Secondary | ICD-10-CM | POA: Diagnosis not present

## 2019-11-27 DIAGNOSIS — R1310 Dysphagia, unspecified: Secondary | ICD-10-CM | POA: Diagnosis not present

## 2019-11-27 DIAGNOSIS — R2681 Unsteadiness on feet: Secondary | ICD-10-CM | POA: Diagnosis not present

## 2019-11-28 DIAGNOSIS — I482 Chronic atrial fibrillation, unspecified: Secondary | ICD-10-CM | POA: Diagnosis not present

## 2019-11-28 DIAGNOSIS — G9341 Metabolic encephalopathy: Secondary | ICD-10-CM | POA: Diagnosis not present

## 2019-11-28 DIAGNOSIS — M6281 Muscle weakness (generalized): Secondary | ICD-10-CM | POA: Diagnosis not present

## 2019-11-28 DIAGNOSIS — R2681 Unsteadiness on feet: Secondary | ICD-10-CM | POA: Diagnosis not present

## 2019-11-28 DIAGNOSIS — R1310 Dysphagia, unspecified: Secondary | ICD-10-CM | POA: Diagnosis not present

## 2019-11-28 DIAGNOSIS — I129 Hypertensive chronic kidney disease with stage 1 through stage 4 chronic kidney disease, or unspecified chronic kidney disease: Secondary | ICD-10-CM | POA: Diagnosis not present

## 2019-11-29 DIAGNOSIS — M6281 Muscle weakness (generalized): Secondary | ICD-10-CM | POA: Diagnosis not present

## 2019-11-29 DIAGNOSIS — G9341 Metabolic encephalopathy: Secondary | ICD-10-CM | POA: Diagnosis not present

## 2019-11-29 DIAGNOSIS — I482 Chronic atrial fibrillation, unspecified: Secondary | ICD-10-CM | POA: Diagnosis not present

## 2019-11-29 DIAGNOSIS — R2681 Unsteadiness on feet: Secondary | ICD-10-CM | POA: Diagnosis not present

## 2019-11-29 DIAGNOSIS — I129 Hypertensive chronic kidney disease with stage 1 through stage 4 chronic kidney disease, or unspecified chronic kidney disease: Secondary | ICD-10-CM | POA: Diagnosis not present

## 2019-11-29 DIAGNOSIS — R1310 Dysphagia, unspecified: Secondary | ICD-10-CM | POA: Diagnosis not present

## 2019-11-30 ENCOUNTER — Non-Acute Institutional Stay (SKILLED_NURSING_FACILITY): Payer: Medicare Other | Admitting: Internal Medicine

## 2019-11-30 DIAGNOSIS — Z8709 Personal history of other diseases of the respiratory system: Secondary | ICD-10-CM | POA: Diagnosis not present

## 2019-11-30 DIAGNOSIS — I4811 Longstanding persistent atrial fibrillation: Secondary | ICD-10-CM

## 2019-11-30 DIAGNOSIS — I482 Chronic atrial fibrillation, unspecified: Secondary | ICD-10-CM | POA: Diagnosis not present

## 2019-11-30 DIAGNOSIS — I129 Hypertensive chronic kidney disease with stage 1 through stage 4 chronic kidney disease, or unspecified chronic kidney disease: Secondary | ICD-10-CM | POA: Diagnosis not present

## 2019-11-30 DIAGNOSIS — D696 Thrombocytopenia, unspecified: Secondary | ICD-10-CM

## 2019-11-30 DIAGNOSIS — I10 Essential (primary) hypertension: Secondary | ICD-10-CM | POA: Diagnosis not present

## 2019-11-30 DIAGNOSIS — G9341 Metabolic encephalopathy: Secondary | ICD-10-CM | POA: Diagnosis not present

## 2019-11-30 DIAGNOSIS — R1310 Dysphagia, unspecified: Secondary | ICD-10-CM | POA: Diagnosis not present

## 2019-11-30 DIAGNOSIS — M6281 Muscle weakness (generalized): Secondary | ICD-10-CM | POA: Diagnosis not present

## 2019-11-30 DIAGNOSIS — I509 Heart failure, unspecified: Secondary | ICD-10-CM

## 2019-11-30 DIAGNOSIS — R2681 Unsteadiness on feet: Secondary | ICD-10-CM | POA: Diagnosis not present

## 2019-12-01 DIAGNOSIS — R2681 Unsteadiness on feet: Secondary | ICD-10-CM | POA: Diagnosis not present

## 2019-12-01 DIAGNOSIS — M6281 Muscle weakness (generalized): Secondary | ICD-10-CM | POA: Diagnosis not present

## 2019-12-01 DIAGNOSIS — G9341 Metabolic encephalopathy: Secondary | ICD-10-CM | POA: Diagnosis not present

## 2019-12-01 DIAGNOSIS — I129 Hypertensive chronic kidney disease with stage 1 through stage 4 chronic kidney disease, or unspecified chronic kidney disease: Secondary | ICD-10-CM | POA: Diagnosis not present

## 2019-12-01 DIAGNOSIS — I482 Chronic atrial fibrillation, unspecified: Secondary | ICD-10-CM | POA: Diagnosis not present

## 2019-12-01 DIAGNOSIS — R1310 Dysphagia, unspecified: Secondary | ICD-10-CM | POA: Diagnosis not present

## 2019-12-02 ENCOUNTER — Encounter: Payer: Self-pay | Admitting: Internal Medicine

## 2019-12-02 NOTE — Progress Notes (Signed)
This is a routine visit.  Level of care skilled.  [facility]ility [facility]  Chief complaint routine visit for medical management of chronic medical conditions including atrial fibrillation-pulmonary embolism-on chronic Coumadin-hypertension-chronic kidney disease-asthma-hyperlipidemia-history of CHF  History of present nauseous.  Patient is a pleasant 86 year old female seen today for follow-up of routine medical issues as noted above.  Currently she has no acute complaints.  We have seen her recently for suspected pseudogout of her hand and this appears to resolve with a short course of prednisone.  She also has borderline hypernatremia-staff has been encouraging fluids and this has been fairly stable with sodium usually hovering in the mid higher 140s.  At one point she was on Lasix but this was discontinued secondary to concerns of elevated sodium-she has been stable despite being off the Lasix she does not really have any increased edema from baseline or signs of fluid overload  Currently she is resting in bed comfortably she has no complaints she does have some confusion but this is baseline.  She is on Coumadin with a history of pulmonary embolism and atrial fibrillation-INR is therapeutic today at 2.7.  Past Medical History:  Diagnosis Date  . Acute encephalopathy   . Acute renal failure superimposed on stage 3 chronic kidney disease ([place])   . Anemia   . Atrial fibrillation ([place])   . Colon cancer ([place]) 12/11  . Colon cancer ([place]) 2011  . Diarrhea   . DVT (deep venous thrombosis) ([place]) 11/06  . Elevated brain natriuretic peptide (BNP) level   . Frequent headaches   . Heart murmur   . Hyperkalemia   . Hypertension   . Pulmonary embolism ([place]) 11/06  . Seizures ([place])   . Stroke ([place]) 11/30/2014  . Urine retention 01/2019        Past Surgical History:  Procedure Laterality Date  . APPENDECTOMY  1952  . COLECTOMY  06/2010   partial, Dr.  Donne Hazel complicated by LLE CVT; S/P IVC umbrella & anemia  . HIP FRACTURE SURGERY  2006   Trauma  . TOTAL ABDOMINAL HYSTERECTOMY W/ BILATERAL SALPINGOOPHORECTOMY  1973   For Fibroids  . TOTAL HIP ARTHROPLASTY  01/03/07   Left hip replacement.  Marland Kitchen VENA CAVA FILTER PLACEMENT  06/28/2010    No Known Allergies    MEDICATIONS      Sig  . acetaminophen (TYLENOL) 500 MG tablet Take 1,000 mg by mouth every 8 (eight) hours as needed for headache (pain).   Marland Kitchen albuterol (PROVENTIL) (2.5 MG/3ML) 0.083% nebulizer solution Take 2.5 mg by nebulization every 6 (six) hours as needed for wheezing or shortness of breath.  Marland Kitchen albuterol (VENTOLIN HFA) 108 (90 Base) MCG/ACT inhaler Inhale 2 puffs into the lungs every 6 (six) hours as needed for wheezing or shortness of breath.  Marland Kitchen aspirin 81 MG chewable tablet Chew 81 mg by mouth daily.  Marland Kitchen atorvastatin (LIPITOR) 20 MG tablet Take 1 tablet (20 mg total) by mouth daily.  . Calcium Carbonate-Vitamin D (OYSTER SHELL/VITAMIN D PO) Take 1 tablet by mouth daily. With breakfast  . Ensure (ENSURE) Take 237 mLs by mouth 3 (three) times daily between meals.  Marland Kitchen escitalopram (LEXAPRO) 5 MG tablet Take 5 mg by mouth daily.  . isosorbide mononitrate (IMDUR) 30 MG 24 hr tablet Take 15 mg by mouth daily.  Marland Kitchen loratadine (CLARITIN) 10 MG tablet Take 1 tablet (10 mg total) by mouth daily as needed (seasonal allergies).  . metoprolol tartrate (LOPRESSOR) 25 MG tablet Take 1.5  tablets (37.5 mg total) by mouth 2 (two) times daily.  . mometasone-formoterol (DULERA) 100-5 MCG/ACT AERO Inhale 2 puffs into the lungs 2 (two) times daily. Gargle and rinse your mouth with water after each use of this medication to help prevent dryness,  . Multiple Vitamin (MULTIVITAMIN WITH MINERALS) TABS tablet Take 1 tablet by mouth daily.  . NON FORMULARY Diet order: Regular liberalized diet d/t poor intake.  . polyethylene glycol (MIRALAX / GLYCOLAX) packet Take 17 g by mouth daily as  needed.  . promethazine (PHENERGAN) 12.5 MG tablet Take 12.5 mg by mouth 3 (three) times daily as needed for nausea or vomiting.  Marland Kitchen QUEtiapine (SEROQUEL) 25 MG tablet Take 25 mg by mouth at bedtime.  . tamsulosin (FLOMAX) 0.4 MG CAPS capsule Take 1 capsule (0.4 mg total) by mouth daily.  . verapamil (VERELAN PM) 120 MG 24 hr capsule Take 120 mg by mouth at bedtime.  Marland Kitchen warfarin (COUMADIN) 2 MG tablet Take 2 mg by mouth daily.        Review of systems.  This is somewhat limited secondary to dementia.  General she is not complaining of any fever or chills.  Skin no complaints of rashes or itching.  Head ears eyes nose mouth and throat is not complain of visual changes or sore throat.  Respiratory not complaining of being short of breath or having a cough.  Cardiac does not complain of chest pain has minimal lower extremity edema more so on her right versus the left but this is not new.  GI is not complaining of abdominal pain nausea vomiting diarrhea constipation.  GU is not complaining of dysuria.  Musculoskeletal does not complain of joint pain currently she does have lower extremity weakness.  Neurologic does have weakness does not complain of dizziness headache or syncope.  And psych does not complain of being depressed or anxious at times agitation apparently with staff but this is transitory   Physical exam.  Temperature is 97.2 pulse 71 respiration 17 blood pressure 141/64 weight 152.6.  Recent blood pressures range from 101 up to the low 140s.  In general this is a pleasant elderly female in no distress lying comfortably in bed.  Her skin is warm and dry there was has some mild chronic bruising of her arms bilaterally.  Eyes visual acuity appears to be intact sclera and conjunctive are clear.  Oropharynx is clear mucous membranes moist.  Chest is clear to auscultation with shallow air entry there is no labored breathing.  Heart is regular irregular rate and  rhythm with baseline systolic murmur she has mild right lower extremity edema and this is not new and baseline.  Abdomen is soft nontender with positive bowel sounds.  Musculoskeletal does move all extremities x4 Limited exam since she is in bed but I do not see any changes other than arthritic which are not new.  Neurologic is grossly intact her speech is clear could not appreciate lateralizing findings.  Psych she is oriented to self pleasant appropriate follows simple verbal commands without difficulty.  Labs.  Nov 30, 2019.  INR 2.7  Nov 09, 2019.  WBC 7.1 hemoglobin 11.2 platelets 83,000.  Sodium 147 potassium 4.5 BUN 26.7 creatinine 0.9   Nov 07, 2019.  INR 2.2.  October 30, 2019-INR 2.5.  October 20, 2019.  Sodium 146 potassium 4.4 BUN 23.9 creatinine 0.79.  September 07, 2019.  WBC 3.4 hemoglobin 11.9 platelets 63,000.        Recent Labs (within last  365 days)                    Recent Labs   01/22/19 0405 01/22/19 0405 01/24/19 0432 03/07/19 1454 04/08/19 0021 04/09/19 0456 04/11/19 0556 04/11/19 0556 04/12/19 0536 04/12/19 0536 04/14/19 0532 05/18/19 0000 09/27/19 0000 10/10/19 0000 10/20/19 0000  NA 142 <> 144 <> 144 <> 148* <> 147* <> 145 <> 144 146 146  K 3.2* <> 4.7 <> 3.5 <> 4.3 <> 4.1 <> 3.8 <> 4.7 4.5 4.4  CL 105 <> 112* <> 107 <> 111 <> 108 <> 109 <> 109* 111* 112*  CO2 27 <> 24 <> 27 <> 27 <> 25 <> 24 <> 22 20 21   GLUCOSE 92 <> 107* <> 114* <> 87 --  93 --  103* --  --  --  --   BUN 24* <> 25* <> 41* <> 31* <> 29* <> 26* <> 39* 30* 24*  CREATININE 1.41* <> 1.33* <> 1.96* <> 1.28* <> 1.26* <> 1.13* <> 1.1 1.1 0.8  CALCIUM 8.5* <> 9.4 <> 9.2 <> 9.0 <> 9.3 <> 9.4 <> 8.8 8.9 8.5*  MG 2.1 --  2.0 --  2.2 --  --  --  --  --  --  --  --  --  --   PHOS 3.4 --  --  --   4.0 --  --  --  --  --  --  --  --  --  --   <>= values in this interval not displayed.     Recent Labs (within last 365 days)       Recent Labs   01/20/19 0918 03/07/19 1454 04/07/19 1530  AST 25 17 31   ALT 15 10 15   ALKPHOS 41 43 56  BILITOT 0.7 0.8 1.4*  PROT 5.9* 6.6 6.9  ALBUMIN 3.8 4.1 4.7     Recent Labs (within last 365 days)             Recent Labs   04/07/19 1530 04/07/19 1530 04/08/19 0021 04/08/19 0021 04/12/19 0536 05/18/19 0000 07/14/19 0000 08/11/19 0000 09/07/19 0000  WBC 8.2 <> 6.9 <> 5.3 <> 3.6 5.2 3.4  NEUTROABS 6.5 --  --  --  --  <> 2 4 2   HGB 13.3 <> 12.7 <> 12.9 <> 12.4 12.4 11.9*  HCT 41.2 <> 40.0 <> 42.0 <> 37 37 36  MCV 95.2 --  97.6 --  100.0 --  --  --  --   PLT 75* <> 76* <> 70* <> 60* 69* 63*  <>= values in this interval not displayed.     Recent Labs     Assessment and plan.  1.  H history of atrial fibrillation this appears rate controlled on verapamil 120 mg a day as well as Lopressor 37.5 mg twice daily-she is on Coumadin for anticoagulation INR currently therapeutic at 2.7-at this point continue 2 mg a day and recheck an INR next week.  2.  Hypernatremia this remains somewhat borderline-but that has not precipitously risen--she has been somewhat adverse to getting IVs in the past-at this point will update next week-staff continues to encourage fluids and apparently she is doing a pretty good job up fluid intake since sodium has been relatively stable.  3.  History of CHF-this has been stable despite being off the Lasix-at this point continue to monitor she does not show concerning weight gain or edema or shortness of breath.  4.  History of thrombocytopenia  this is chronic Lasix platelet count was 83,000 which is been stable now for some time will monitor periodically there is been no increase bruising or bleeding from baseline.  5.  Pseudogout-currently  this is stable at times she will have a flare of her various fingers and usually responds to a short course of prednisone.  6.-History of pulmonary embolism-again she is on chronic anticoagulation with Coumadin as noted above INR is therapeutic.  7.  History of asthma-this continues to be stable she is on [facility] and does have as needed albuterol as needed-she does not complain of shortness of breath today.  At times she will but usually responds to her albuterol.  At 1 point we did do a chest x-ray which did not show any acute process.  8.  History of cognitive impairment with psychosis-occasionally she will have behaviors but these are of short duration-she is on Seroquel 25 mg at night and this appears to be stable.  She also has coexistent depression and is on Lexapro 5 mg a day.  9.  History of coronary artery disease this is been stable during her stay-she is on Imdur 15 mg a day as well as atorvastatin 20 mg a day and aspirin 81 mg a day.  She also continues on a beta-blocker Lopressor 37.5 mg twice daily  10.  History of chronic kidney disease this is been quite stable for some time as well last creatinine was 0.9 BUN 26.7 again will update this next week.  #11 hypertension this appears stable she is on Lopressor 37.5 mg twice daily as well as verapamil 120 mg daily.  Recent blood pressures 141/64-101/60-129/65  [id number]

## 2019-12-03 DIAGNOSIS — M6281 Muscle weakness (generalized): Secondary | ICD-10-CM | POA: Diagnosis not present

## 2019-12-03 DIAGNOSIS — G9341 Metabolic encephalopathy: Secondary | ICD-10-CM | POA: Diagnosis not present

## 2019-12-03 DIAGNOSIS — R1310 Dysphagia, unspecified: Secondary | ICD-10-CM | POA: Diagnosis not present

## 2019-12-03 DIAGNOSIS — I482 Chronic atrial fibrillation, unspecified: Secondary | ICD-10-CM | POA: Diagnosis not present

## 2019-12-03 DIAGNOSIS — R2681 Unsteadiness on feet: Secondary | ICD-10-CM | POA: Diagnosis not present

## 2019-12-03 DIAGNOSIS — I129 Hypertensive chronic kidney disease with stage 1 through stage 4 chronic kidney disease, or unspecified chronic kidney disease: Secondary | ICD-10-CM | POA: Diagnosis not present

## 2019-12-04 DIAGNOSIS — I129 Hypertensive chronic kidney disease with stage 1 through stage 4 chronic kidney disease, or unspecified chronic kidney disease: Secondary | ICD-10-CM | POA: Diagnosis not present

## 2019-12-04 DIAGNOSIS — M6281 Muscle weakness (generalized): Secondary | ICD-10-CM | POA: Diagnosis not present

## 2019-12-04 DIAGNOSIS — R1310 Dysphagia, unspecified: Secondary | ICD-10-CM | POA: Diagnosis not present

## 2019-12-04 DIAGNOSIS — G9341 Metabolic encephalopathy: Secondary | ICD-10-CM | POA: Diagnosis not present

## 2019-12-04 DIAGNOSIS — R2681 Unsteadiness on feet: Secondary | ICD-10-CM | POA: Diagnosis not present

## 2019-12-04 DIAGNOSIS — I482 Chronic atrial fibrillation, unspecified: Secondary | ICD-10-CM | POA: Diagnosis not present

## 2019-12-07 DIAGNOSIS — D649 Anemia, unspecified: Secondary | ICD-10-CM | POA: Diagnosis not present

## 2019-12-07 DIAGNOSIS — I10 Essential (primary) hypertension: Secondary | ICD-10-CM | POA: Diagnosis not present

## 2019-12-07 LAB — BASIC METABOLIC PANEL
BUN: 32 — AB (ref 4–21)
CO2: 18 (ref 13–22)
Chloride: 108 (ref 99–108)
Creatinine: 1.2 — AB (ref 0.5–1.1)
Glucose: 79
Potassium: 4.4 (ref 3.4–5.3)
Sodium: 145 (ref 137–147)

## 2019-12-07 LAB — COMPREHENSIVE METABOLIC PANEL
Albumin: 4.3 (ref 3.5–5.0)
Calcium: 9.9 (ref 8.7–10.7)
GFR calc Af Amer: 46.83
GFR calc non Af Amer: 40.41
Globulin: 2.3

## 2019-12-07 LAB — HEPATIC FUNCTION PANEL
ALT: 6 — AB (ref 7–35)
AST: 15 (ref 13–35)

## 2019-12-07 LAB — CBC AND DIFFERENTIAL
HCT: 36 (ref 36–46)
Hemoglobin: 11.8 — AB (ref 12.0–16.0)
WBC: 6.6

## 2019-12-07 LAB — CBC: RBC: 4.11 (ref 3.87–5.11)

## 2020-01-01 ENCOUNTER — Non-Acute Institutional Stay (SKILLED_NURSING_FACILITY): Payer: Medicare Other | Admitting: Family

## 2020-01-01 ENCOUNTER — Encounter: Payer: Self-pay | Admitting: Family

## 2020-01-01 DIAGNOSIS — F0392 Unspecified dementia, unspecified severity, with psychotic disturbance: Secondary | ICD-10-CM

## 2020-01-01 DIAGNOSIS — F0391 Unspecified dementia with behavioral disturbance: Secondary | ICD-10-CM

## 2020-01-01 DIAGNOSIS — I4811 Longstanding persistent atrial fibrillation: Secondary | ICD-10-CM | POA: Diagnosis not present

## 2020-01-01 DIAGNOSIS — R32 Unspecified urinary incontinence: Secondary | ICD-10-CM

## 2020-01-01 DIAGNOSIS — I10 Essential (primary) hypertension: Secondary | ICD-10-CM

## 2020-01-01 DIAGNOSIS — I509 Heart failure, unspecified: Secondary | ICD-10-CM

## 2020-01-01 DIAGNOSIS — R300 Dysuria: Secondary | ICD-10-CM

## 2020-01-01 DIAGNOSIS — B372 Candidiasis of skin and nail: Secondary | ICD-10-CM | POA: Diagnosis not present

## 2020-01-01 MED ORDER — NYSTATIN 100000 UNIT/GM EX CREA: 1.0000 "application " | TOPICAL_CREAM | Freq: Two times a day (BID) | CUTANEOUS | 0 refills | Status: DC

## 2020-01-01 NOTE — Progress Notes (Signed)
Location:    [facility].   Nursing Home Room Number: 205-D Place of Service:  SNF (31) Provider:  Marlowe Sax, NP   Patient Care Team: Gayland Curry, DO as PCP - General (Geriatric Medicine) Atha Muradyan, Nelda Bucks, NP as Nurse Practitioner (Family Medicine) Re[facility], [place] [facility])  Extended Emergency Contact Information Primary Emergency Contact: [name],[name] Address: [address]          [place], [place] [address] [name] of [place] Phone: [phone] Mobile Phone: [phone] Relation: Daughter  Code Status:  DNR Goals of care: Advanced Directive information Advanced Directives 01/01/2020  Does Patient Have a Medical Advance Directive? Yes  Type of Advance Directive Out of facility DNR (pink MOST or yellow form)  Does patient want to make changes to medical advance directive? No - Guardian declined  Copy of [place] in Chart? -  Would patient like information on creating a medical advance directive? -  Pre-existing out of facility DNR order (yellow form or pink MOST form) Yellow form placed in chart (order not valid for inpatient use)     Chief Complaint  Patient presents with   Medical Management of Chronic Issues    Routine Visit.     HPI:  Pt is a 86 y.o. female seen today for medical management of chronic diseases.She is seen in her room sitting up in bed.she complains of pain with urination.she is incontinent for urine not aware if she goes frequently or not.she denies any lower abdominal pain or urgency.Has had no fever or chills. AFib/Hx of pulmonary Embolism  - Her recent INR was 2.3 with goal 2-3 has order to recheck INR 01/02/2020.she denies any signs of bleeding. Has had no fall episode.Her weight remains stable.she is on a liberalized mechanical soft diet with thin liquids with Ensure three times between meals.  Hypertension - Facility B/p readings log reviewed  stable.Currently on metoprolol 37.5 mg tablet twice daily,verapamil 120 mg 24 Hr capsule at bedtime and Imdur 15 mg tablet daily.Also on ASA 81 mg tablet and Atorvastatin 20 mg tablet daily.she denies any symptoms of hypotension.   CHF - she denies any cough,shortness of breath or wheezing.Has had no legs swelling.Her weight has been stable no abrupt weight gain.Not on any diuretic.On chart review,has been on lasix in the past..On metoprolol 37.5 mg tablet twice daily,verapamil 120 mg 24 Hr capsule at bedtime and Imdur 15 mg tablet daily.  Dementia - facility staff reports some confusion but no new behavioral issues.on Seroquel 75 mg at bedtime and escitalopram 5 mg tablet daily.   Past Medical History:  Diagnosis Date   Acute encephalopathy    Acute renal failure superimposed on stage 3 chronic kidney disease (HCC)    Anemia    Atrial fibrillation ([place])    Colon cancer ([place]) 12/11   Colon cancer ([place]) 2011   Diarrhea    DVT (deep venous thrombosis) ([place]) 11/06   Elevated brain natriuretic peptide (BNP) level    Frequent headaches    Heart murmur    Hyperkalemia    Hypertension    Pulmonary embolism ([place]) 11/06   Seizures ([place])    Stroke ([place]) 11/30/2014   Urine retention 01/2019   Past Surgical History:  Procedure Laterality Date   APPENDECTOMY  1952   COLECTOMY  06/2010   partial, Dr. Donne Hazel complicated by LLE CVT; S/P IVC umbrella & anemia   HIP FRACTURE SURGERY  2006   Trauma  TOTAL ABDOMINAL HYSTERECTOMY W/ BILATERAL SALPINGOOPHORECTOMY  1973   For Fibroids   TOTAL HIP ARTHROPLASTY  01/03/07   Left hip replacement.   VENA CAVA FILTER PLACEMENT  06/28/2010    No Known Allergies  Allergies as of 01/01/2020   No Known Allergies     Medication List       Accurate as of January 01, 2020 11:56 AM. If you have any questions, ask your nurse or doctor.        acetaminophen 500 MG tablet Commonly known as: TYLENOL Take 1,000 mg by mouth every 8  (eight) hours as needed for headache (pain).   albuterol (2.5 MG/3ML) 0.083% nebulizer solution Commonly known as: PROVENTIL Take 2.5 mg by nebulization every 6 (six) hours as needed for wheezing or shortness of breath.   albuterol 108 (90 Base) MCG/ACT inhaler Commonly known as: VENTOLIN HFA Inhale 2 puffs into the lungs every 6 (six) hours as needed for wheezing or shortness of breath.   Anusol-HC 2.5 % rectal cream Generic drug: hydrocortisone Place 1 application rectally 2 (two) times daily as needed for hemorrhoids or anal itching.   aspirin 81 MG chewable tablet Chew 81 mg by mouth daily.   atorvastatin 20 MG tablet Commonly known as: LIPITOR Take 1 tablet (20 mg total) by mouth daily.   Ensure Take 237 mLs by mouth 3 (three) times daily between meals.   escitalopram 5 MG tablet Commonly known as: LEXAPRO Take 5 mg by mouth daily.   isosorbide mononitrate 30 MG 24 hr tablet Commonly known as: IMDUR Take 15 mg by mouth daily.   loratadine 10 MG tablet Commonly known as: CLARITIN Take 1 tablet (10 mg total) by mouth daily as needed (seasonal allergies).   Metoprolol Tartrate 37.5 MG Tabs Take 1 tablet by mouth 2 (two) times daily. What changed: Another medication with the same name was removed. Continue taking this medication, and follow the directions you see here. Changed by: Sandrea Hughs, NP   mometasone-formoterol 100-5 MCG/ACT Aero Commonly known as: DULERA Inhale 2 puffs into the lungs 2 (two) times daily. Gargle and rinse your mouth with water after each use of this medication to help prevent dryness,   multivitamin with minerals Tabs tablet Take 1 tablet by mouth daily.   NON FORMULARY Diet order: Regular liberalized diet d/t poor intake.   OYSTER SHELL/VITAMIN D PO Take 1 tablet by mouth daily. With breakfast   polyethylene glycol 17 g packet Commonly known as: MIRALAX / GLYCOLAX Take 17 g by mouth daily as needed.   promethazine 12.5 MG  tablet Commonly known as: PHENERGAN Take 12.5 mg by mouth 3 (three) times daily as needed for nausea or vomiting.   QUEtiapine 25 MG tablet Commonly known as: SEROQUEL Take 25 mg by mouth at bedtime.   QUEtiapine 50 MG tablet Commonly known as: SEROQUEL Take 50 mg by mouth at bedtime.   tamsulosin 0.4 MG Caps capsule Commonly known as: FLOMAX Take 1 capsule (0.4 mg total) by mouth daily.   verapamil 120 MG 24 hr capsule Commonly known as: VERELAN PM Take 120 mg by mouth at bedtime.   warfarin 2 MG tablet Commonly known as: COUMADIN Take as directed by the anticoagulation clinic. If you are unsure how to take this medication, talk to your nurse or doctor. Original instructions: Take 2 mg by mouth daily.       Review of Systems  Constitutional: Negative for appetite change, chills, fatigue and fever.  HENT: Negative for  congestion, rhinorrhea, sinus pressure, sinus pain, sneezing and sore throat.   Eyes: Negative for discharge, redness and itching.  Respiratory: Negative for cough, chest tightness, shortness of breath and wheezing.   Cardiovascular: Negative for chest pain, palpitations and leg swelling.  Gastrointestinal: Negative for abdominal distention, abdominal pain, constipation, diarrhea, nausea and vomiting.  Endocrine: Negative for cold intolerance, heat intolerance, polydipsia, polyphagia and polyuria.  Genitourinary: Positive for dysuria. Negative for difficulty urinating, flank pain, frequency and urgency.       Incontinent   Musculoskeletal: Positive for gait problem. Negative for back pain, joint swelling and myalgias.  Skin: Negative for color change, pallor, rash and wound.  Neurological: Negative for dizziness, speech difficulty, weakness, light-headedness, numbness and headaches.  Hematological: Does not bruise/bleed easily.  Psychiatric/Behavioral: Positive for confusion. Negative for agitation, behavioral problems and sleep disturbance. The patient is not  nervous/anxious.     Immunization History  Administered Date(s) Administered   Fluad Quad(high Dose 65+) 03/07/2019   Influenza Split 03/30/2012, 03/07/2019   Influenza Whole 04/30/2006, 04/20/2008, 04/15/2009   Influenza, High Dose Seasonal PF 04/20/2013, 03/27/2015, 03/31/2016, 04/22/2017, 04/20/2018   Influenza,inj,Quad PF,6+ Mos 05/23/2014   Moderna SARS-COVID-2 Vaccination 07/06/2019, 08/03/2019   Pneumococcal Conjugate-13 04/24/2015   Pneumococcal Polysaccharide-23 07/06/2009   Td 10/30/2016   Pertinent  Health Maintenance Due  Topic Date Due   DEXA SCAN  02/27/2020 (Originally 11/26/1993)   INFLUENZA VACCINE  02/04/2020   PNA vac Low Risk Adult  Completed   Fall Risk  07/22/2018 01/18/2018 10/30/2016 08/14/2015 04/24/2015  Falls in the past year? 1 No No No No  Number falls in past yr: 1 - - - -  Injury with Fall? 0 - - - -    Vitals:   01/01/20 1145  BP: 95/62  Pulse: 61  Resp: 18  Temp: 97.7 F (36.5 C)  SpO2: 96%  Weight: 153 lb 12.8 oz (69.8 kg)  Height: 5\' 3"  (1.6 m)   Body mass index is 27.24 kg/m. Physical Exam Vitals reviewed.  Constitutional:      General: She is not in acute distress.    Appearance: She is overweight. She is not ill-appearing.  HENT:     Head: Normocephalic.     Nose: Nose normal. No congestion or rhinorrhea.     Mouth/Throat:     Mouth: Mucous membranes are moist.     Pharynx: Oropharynx is clear. No oropharyngeal exudate or posterior oropharyngeal erythema.  Eyes:     General: No scleral icterus.       Right eye: No discharge.        Left eye: No discharge.     Extraocular Movements: Extraocular movements intact.     Conjunctiva/sclera: Conjunctivae normal.     Pupils: Pupils are equal, round, and reactive to light.  Neck:     Vascular: No carotid bruit.  Cardiovascular:     Rate and Rhythm: Normal rate and regular rhythm.     Pulses: Normal pulses.     Heart sounds: Normal heart sounds. No murmur heard.  No  friction rub. No gallop.   Pulmonary:     Effort: Pulmonary effort is normal. No respiratory distress.     Breath sounds: Normal breath sounds. No wheezing, rhonchi or rales.  Chest:     Chest wall: No tenderness.  Abdominal:     General: Bowel sounds are normal. There is no distension.     Palpations: Abdomen is soft. There is no mass.     Tenderness:  There is no abdominal tenderness. There is no right CVA tenderness, left CVA tenderness, guarding or rebound.  Musculoskeletal:        General: No swelling or tenderness.     Cervical back: Normal range of motion. No rigidity or tenderness.     Right lower leg: No edema.     Left lower leg: No edema.     Comments: Moves x 4 extremities without any difficulties.Requires assistance with transfer.  Lymphadenopathy:     Cervical: No cervical adenopathy.  Skin:    General: Skin is warm.     Coloration: Skin is not pale.     Findings: Erythema present. No bruising, lesion or rash.     Comments: Biliateral breast and groin skin fold areas beefy skin redness noted.   Neurological:     Mental Status: She is alert. Mental status is at baseline.     Motor: No weakness.     Gait: Gait abnormal.     Comments: Alert and oriented x 2   Psychiatric:        Mood and Affect: Mood normal.        Speech: Speech normal.        Behavior: Behavior normal.        Thought Content: Thought content normal.     Labs reviewed: Recent Labs    01/22/19 0405 01/22/19 0405 01/24/19 0432 03/07/19 1454 04/08/19 0021 04/09/19 0456 04/11/19 0556 04/11/19 0556 04/12/19 0536 04/12/19 0536 04/14/19 0532 05/18/19 0000 09/27/19 0000 10/10/19 0000 10/20/19 0000  NA 142   < > 144   < > 144   < > 148*   < > 147*   < > 145   < > 144 146 146  K 3.2*   < > 4.7   < > 3.5   < > 4.3   < > 4.1   < > 3.8   < > 4.7 4.5 4.4  CL 105   < > 112*   < > 107   < > 111   < > 108   < > 109   < > 109* 111* 112*  CO2 27   < > 24   < > 27   < > 27   < > 25   < > 24   < > 22 20  21   GLUCOSE 92   < > 107*   < > 114*   < > 87  --  93  --  103*  --   --   --   --   BUN 24*   < > 25*   < > 41*   < > 31*   < > 29*   < > 26*   < > 39* 30* 24*  CREATININE 1.41*   < > 1.33*   < > 1.96*   < > 1.28*   < > 1.26*   < > 1.13*   < > 1.1 1.1 0.8  CALCIUM 8.5*   < > 9.4   < > 9.2   < > 9.0   < > 9.3   < > 9.4   < > 8.8 8.9 8.5*  MG 2.1  --  2.0  --  2.2  --   --   --   --   --   --   --   --   --   --   PHOS 3.4  --   --   --  4.0  --   --   --   --   --   --   --   --   --   --    < > = values in this interval not displayed.   Recent Labs    01/20/19 0918 03/07/19 1454 04/07/19 1530  AST 25 17 31   ALT 15 10 15   ALKPHOS 41 43 56  BILITOT 0.7 0.8 1.4*  PROT 5.9* 6.6 6.9  ALBUMIN 3.8 4.1 4.7   Recent Labs    04/07/19 1530 04/07/19 1530 04/08/19 0021 04/08/19 0021 04/12/19 0536 05/18/19 0000 07/14/19 0000 08/11/19 0000 09/07/19 0000  WBC 8.2   < > 6.9   < > 5.3   < > 3.6 5.2 3.4  NEUTROABS 6.5  --   --   --   --    < > 2 4 2   HGB 13.3   < > 12.7   < > 12.9   < > 12.4 12.4 11.9*  HCT 41.2   < > 40.0   < > 42.0   < > 37 37 36  MCV 95.2  --  97.6  --  100.0  --   --   --   --   PLT 75*   < > 76*   < > 70*   < > 60* 69* 63*   < > = values in this interval not displayed.   Lab Results  Component Value Date   TSH 3.58 08/22/2018   Lab Results  Component Value Date   HGBA1C 5.3 09/07/2019   Lab Results  Component Value Date   CHOL 97 08/16/2017   HDL 37.70 (L) 08/16/2017   LDLCALC 48 08/16/2017   TRIG 56.0 08/16/2017   CHOLHDL 3 08/16/2017    Significant Diagnostic Results in last 30 days:  No results found.  Assessment/Plan 1. Candidiasis of skin Bilateral breast and groin skin fold areas beefy skin redness noted.will order Nystatin cream twice daily as below then continue to monitor.Facility staff to keep skin fold areas dry.  - nystatin cream (MYCOSTATIN); Apply 1 application topically 2 (two) times daily. Apply to affected areas on bilateral breast  and groin skin fold for skin redness.  Dispense: 30 g; Refill: 0  2. Essential hypertension B/p stable.continue on metoprolol 37.5 mg tablet twice daily,verapamil 120 mg 24 Hr capsule at bedtime and Imdur 15 mg tablet daily.Continue ASA and Statin.will check Lipid panel with her next lab work.   3. Longstanding persistent atrial fibrillation ([place]) Latest INR at goal 2-3.continue on current dose of coumadin.Due for INR check 01/02/2020. Hgb reviewed stable.No bleeding noted.   4. Congestive heart failure, unspecified HF chronicity, unspecified heart failure type ([place]) No signs of fluid overload.Off diuretics.continue on metoprolol 37.5 mg tablet twice daily,verapamil 120 mg 24 Hr capsule at bedtime and Imdur 15 mg tablet daily  5. Dysuria Afebrile.Encouraged to increase fluid intake.Will order urine specimen for U/A and C/S to rule UTI.   6. Urinary incontinence, unspecified type Continue with Peri-care.  7. Dementia with psychosis ([place]) Confused at baseline.No new behavioral issues reported.continue on Seroquel 75 mg at bedtime and escitalopram 5 mg tablet daily.continue with supportive care.   Family/ staff Communication: Reviewed plan of care with patient and facility Nurse.  Labs/tests ordered:  U/A and C/S to rule UTI ordered.

## 2020-01-12 DIAGNOSIS — R1312 Dysphagia, oropharyngeal phase: Secondary | ICD-10-CM | POA: Diagnosis not present

## 2020-01-15 DIAGNOSIS — R1312 Dysphagia, oropharyngeal phase: Secondary | ICD-10-CM | POA: Diagnosis not present

## 2020-01-16 DIAGNOSIS — R1312 Dysphagia, oropharyngeal phase: Secondary | ICD-10-CM | POA: Diagnosis not present

## 2020-01-17 DIAGNOSIS — R1312 Dysphagia, oropharyngeal phase: Secondary | ICD-10-CM | POA: Diagnosis not present

## 2020-01-18 DIAGNOSIS — R1312 Dysphagia, oropharyngeal phase: Secondary | ICD-10-CM | POA: Diagnosis not present

## 2020-01-19 DIAGNOSIS — R1312 Dysphagia, oropharyngeal phase: Secondary | ICD-10-CM | POA: Diagnosis not present

## 2020-01-22 DIAGNOSIS — R1312 Dysphagia, oropharyngeal phase: Secondary | ICD-10-CM | POA: Diagnosis not present

## 2020-01-23 DIAGNOSIS — R1312 Dysphagia, oropharyngeal phase: Secondary | ICD-10-CM | POA: Diagnosis not present

## 2020-01-24 DIAGNOSIS — F3289 Other specified depressive episodes: Secondary | ICD-10-CM | POA: Diagnosis not present

## 2020-01-24 DIAGNOSIS — R1312 Dysphagia, oropharyngeal phase: Secondary | ICD-10-CM | POA: Diagnosis not present

## 2020-01-24 DIAGNOSIS — F411 Generalized anxiety disorder: Secondary | ICD-10-CM | POA: Diagnosis not present

## 2020-01-24 DIAGNOSIS — G3184 Mild cognitive impairment, so stated: Secondary | ICD-10-CM | POA: Diagnosis not present

## 2020-01-31 ENCOUNTER — Encounter: Payer: Self-pay | Admitting: Family

## 2020-01-31 ENCOUNTER — Non-Acute Institutional Stay (SKILLED_NURSING_FACILITY): Payer: Medicare Other | Admitting: Family

## 2020-01-31 DIAGNOSIS — J449 Chronic obstructive pulmonary disease, unspecified: Secondary | ICD-10-CM | POA: Diagnosis not present

## 2020-01-31 DIAGNOSIS — I10 Essential (primary) hypertension: Secondary | ICD-10-CM | POA: Diagnosis not present

## 2020-01-31 DIAGNOSIS — R634 Abnormal weight loss: Secondary | ICD-10-CM | POA: Diagnosis not present

## 2020-01-31 DIAGNOSIS — F0391 Unspecified dementia with behavioral disturbance: Secondary | ICD-10-CM

## 2020-01-31 DIAGNOSIS — I4811 Longstanding persistent atrial fibrillation: Secondary | ICD-10-CM

## 2020-01-31 DIAGNOSIS — I509 Heart failure, unspecified: Secondary | ICD-10-CM | POA: Diagnosis not present

## 2020-01-31 DIAGNOSIS — I25118 Atherosclerotic heart disease of native coronary artery with other forms of angina pectoris: Secondary | ICD-10-CM

## 2020-01-31 DIAGNOSIS — F0392 Unspecified dementia, unspecified severity, with psychotic disturbance: Secondary | ICD-10-CM

## 2020-01-31 NOTE — Progress Notes (Signed)
Location:    [place].   Nursing Home Room Number: 205-D Place of Service:  SNF (31) Provider:  Marlowe Sax, NP    Patient Care Team: Gayland Curry, DO as PCP - General (Geriatric Medicine) Aleem Elza, Nelda Bucks, NP as Nurse Practitioner (Family Medicine) Rehab, [facility] ([place])  Extended Emergency Contact Information Primary Emergency Contact: [name],[name] Address: [address]          [place], [place] [address] [name] of [place] Phone: [phone] Mobile Phone: [phone] Relation: Daughter  Code Status:  DNR Goals of care: Advanced Directive information Advanced Directives 01/31/2020  Does Patient Have a Medical Advance Directive? Yes  Type of Advance Directive Out of facility DNR (pink MOST or yellow form)  Does patient want to make changes to medical advance directive? No - Patient declined  Copy of [place] in Chart? -  Would patient like information on creating a medical advance directive? -  Pre-existing out of facility DNR order (yellow form or pink MOST form) Yellow form placed in chart (order not valid for inpatient use)     Chief Complaint  Patient presents with  . Medical Management of Chronic Issues    Routine Visit.    HPI:  Pt is a 86 y.o. female long term care resident at [facility] seen today for medical management of chronic diseases.she has a medical history of Hypertension,Diastolic CHF,COPD,thromocytopenia,Afib on chronic anticoagulation coumadin,Hx of pulmonary embolism,demetia with psychosis among other condition.she is seen in her room today sitting on her wheelchair.she self propels on wheelchair.she denies any acute issues during visit.Speech therapy following for issues with chewing.she has had a weight loss of 7.5 lbs over two weeks though has had progressive weight loss.Her diet has been upgraded to a regular.Also on magic  cup,ensure three times daily and chocolate milk with meals.No fall episode reported.     Past Medical History:  Diagnosis Date  . Acute encephalopathy   . Acute renal failure superimposed on stage 3 chronic kidney disease ([place])   . Anemia   . Atrial fibrillation ([place])   . Colon cancer ([place]) 12/11  . Colon cancer ([place]) 2011  . Diarrhea   . DVT (deep venous thrombosis) ([place]) 11/06  . Elevated brain natriuretic peptide (BNP) level   . Frequent headaches   . Heart murmur   . Hyperkalemia   . Hypertension   . Pulmonary embolism ([place]) 11/06  . Seizures ([place])   . Stroke ([place]) 11/30/2014  . Urine retention 01/2019   Past Surgical History:  Procedure Laterality Date  . APPENDECTOMY  1952  . COLECTOMY  06/2010   partial, Dr. Donne Hazel complicated by LLE CVT; S/P IVC umbrella & anemia  . HIP FRACTURE SURGERY  2006   Trauma  . TOTAL ABDOMINAL HYSTERECTOMY W/ BILATERAL SALPINGOOPHORECTOMY  1973   For Fibroids  . TOTAL HIP ARTHROPLASTY  01/03/07   Left hip replacement.  Marland Kitchen VENA CAVA FILTER PLACEMENT  06/28/2010    No Known Allergies  Allergies as of 01/31/2020   No Known Allergies     Medication List       Accurate as of January 31, 2020  9:33 AM. If you have any questions, ask your nurse or doctor.        STOP taking these medications   nystatin cream Commonly known as: MYCOSTATIN Stopped by: Sandrea Hughs, NP     TAKE these medications   acetaminophen  500 MG tablet Commonly known as: TYLENOL Take 1,000 mg by mouth every 8 (eight) hours as needed for headache (pain).   albuterol (2.5 MG/3ML) 0.083% nebulizer solution Commonly known as: PROVENTIL Take 2.5 mg by nebulization every 6 (six) hours as needed for wheezing or shortness of breath.   albuterol 108 (90 Base) MCG/ACT inhaler Commonly known as: VENTOLIN HFA Inhale 2 puffs into the lungs every 6 (six) hours as needed for wheezing or shortness of breath.   Anusol-HC 2.5 % rectal cream Generic drug:  hydrocortisone Place 1 application rectally 2 (two) times daily as needed for hemorrhoids or anal itching.   aspirin 81 MG chewable tablet Chew 81 mg by mouth daily.   atorvastatin 20 MG tablet Commonly known as: LIPITOR Take 1 tablet (20 mg total) by mouth daily.   Ensure Take 237 mLs by mouth 3 (three) times daily between meals.   escitalopram 5 MG tablet Commonly known as: LEXAPRO Take 5 mg by mouth daily.   isosorbide mononitrate 30 MG 24 hr tablet Commonly known as: IMDUR Take 15 mg by mouth daily.   loratadine 10 MG tablet Commonly known as: CLARITIN Take 1 tablet (10 mg total) by mouth daily as needed (seasonal allergies).   Metoprolol Tartrate 37.5 MG Tabs Take 1 tablet by mouth 2 (two) times daily.   mometasone-formoterol 100-5 MCG/ACT Aero Commonly known as: DULERA Inhale 2 puffs into the lungs 2 (two) times daily. Gargle and rinse your mouth with water after each use of this medication to help prevent dryness,   multivitamin with minerals Tabs tablet Take 1 tablet by mouth daily.   NON FORMULARY Diet order: Regular liberalized diet d/t poor intake.   OYSTER SHELL/VITAMIN D PO Take 1 tablet by mouth daily. With breakfast   polyethylene glycol 17 g packet Commonly known as: MIRALAX / GLYCOLAX Take 17 g by mouth daily as needed.   promethazine 12.5 MG tablet Commonly known as: PHENERGAN Take 12.5 mg by mouth 3 (three) times daily as needed for nausea or vomiting.   QUEtiapine 50 MG tablet Commonly known as: SEROQUEL Take 50 mg by mouth at bedtime. What changed: Another medication with the same name was removed. Continue taking this medication, and follow the directions you see here. Changed by: Sandrea Hughs, NP   tamsulosin 0.4 MG Caps capsule Commonly known as: FLOMAX Take 1 capsule (0.4 mg total) by mouth daily.   verapamil 120 MG 24 hr capsule Commonly known as: VERELAN PM Take 120 mg by mouth at bedtime.   warfarin 2 MG tablet Commonly  known as: COUMADIN Take as directed by the anticoagulation clinic. If you are unsure how to take this medication, talk to your nurse or doctor. Original instructions: Take 2 mg by mouth daily.       Review of Systems  Constitutional: Negative for appetite change, chills, fatigue and fever.  HENT: Negative for congestion, rhinorrhea, sinus pressure, sinus pain, sneezing and sore throat.        Speech therapist following up for chewing issues   Eyes: Negative for discharge and redness.  Respiratory: Negative for cough, chest tightness, shortness of breath and wheezing.   Cardiovascular: Positive for leg swelling. Negative for chest pain and palpitations.  Gastrointestinal: Negative for abdominal distention, abdominal pain, blood in stool, constipation, diarrhea, nausea and vomiting.  Endocrine: Negative for cold intolerance, heat intolerance, polydipsia, polyphagia and polyuria.  Genitourinary:       Incontinent   Musculoskeletal: Positive for gait problem. Negative for  joint swelling and myalgias.  Skin: Negative for color change, pallor and rash.  Neurological: Negative for dizziness, seizures, speech difficulty, light-headedness and headaches.  Hematological: Does not bruise/bleed easily.  Psychiatric/Behavioral: Positive for confusion. Negative for agitation, behavioral problems and sleep disturbance. The patient is not nervous/anxious.     Immunization History  Administered Date(s) Administered  . Fluad Quad(high Dose 65+) 03/07/2019  . Influenza Split 03/30/2012, 03/07/2019  . Influenza Whole 04/30/2006, 04/20/2008, 04/15/2009  . Influenza, High Dose Seasonal PF 04/20/2013, 03/27/2015, 03/31/2016, 04/22/2017, 04/20/2018  . Influenza,inj,Quad PF,6+ Mos 05/23/2014  . Moderna SARS-COVID-2 Vaccination 07/06/2019, 08/03/2019  . Pneumococcal Conjugate-13 04/24/2015  . Pneumococcal Polysaccharide-23 07/06/2009  . Td 10/30/2016   Pertinent  Health Maintenance Due  Topic Date Due  .  DEXA SCAN  02/27/2020 (Originally 11/26/1993)  . INFLUENZA VACCINE  02/04/2020  . PNA vac Low Risk Adult  Completed   Fall Risk  07/22/2018 01/18/2018 10/30/2016 08/14/2015 04/24/2015  Falls in the past year? 1 No No No No  Number falls in past yr: 1 - - - -  Injury with Fall? 0 - - - -     Vitals:   01/31/20 0918  BP: 126/79  Pulse: 79  Resp: 19  Temp: 97.6 F (36.4 C)  SpO2: 96%  Weight: 143 lb 12.8 oz (65.2 kg)  Height: 5\' 3"  (1.6 m)   Body mass index is 25.47 kg/m. Physical Exam Vitals reviewed.  Constitutional:      General: She is not in acute distress.    Appearance: She is overweight. She is not ill-appearing.  HENT:     Head: Normocephalic.     Nose: No congestion or rhinorrhea.     Mouth/Throat:     Mouth: Mucous membranes are moist.     Pharynx: Oropharynx is clear. No oropharyngeal exudate or posterior oropharyngeal erythema.  Eyes:     General: No scleral icterus.       Right eye: No discharge.        Left eye: No discharge.     Conjunctiva/sclera: Conjunctivae normal.     Pupils: Pupils are equal, round, and reactive to light.  Neck:     Vascular: No carotid bruit.  Cardiovascular:     Rate and Rhythm: Normal rate and regular rhythm.     Pulses: Normal pulses.     Heart sounds: Normal heart sounds. No friction rub. No gallop.   Pulmonary:     Effort: Pulmonary effort is normal. No respiratory distress.     Breath sounds: Normal breath sounds. No wheezing, rhonchi or rales.  Chest:     Chest wall: No tenderness.  Abdominal:     General: Bowel sounds are normal. There is no distension.     Palpations: Abdomen is soft. There is no mass.     Tenderness: There is no abdominal tenderness. There is no right CVA tenderness, left CVA tenderness, guarding or rebound.  Musculoskeletal:        General: No tenderness.     Cervical back: Normal range of motion. No rigidity or tenderness.     Comments: Self propels on wheelchair.bilateral lower extremities 1+ edema    Lymphadenopathy:     Cervical: No cervical adenopathy.  Skin:    General: Skin is warm.     Coloration: Skin is not pale.     Findings: No bruising, erythema or rash.  Neurological:     Mental Status: She is alert. Mental status is at baseline.  Cranial Nerves: No cranial nerve deficit.     Motor: No weakness.     Gait: Gait abnormal.  Psychiatric:        Mood and Affect: Mood normal.        Behavior: Behavior normal.        Thought Content: Thought content normal.        Judgment: Judgment normal.    Labs reviewed: Recent Labs    04/08/19 0021 04/09/19 0456 04/11/19 0556 04/11/19 0556 04/12/19 0536 04/12/19 0536 04/14/19 0532 05/18/19 0000 10/20/19 0000 11/09/19 0000 12/07/19 0000  NA 144   < > 148*   < > 147*   < > 145   < > 146 147 145  K 3.5   < > 4.3   < > 4.1   < > 3.8   < > 4.4 4.5 4.4  CL 107   < > 111   < > 108   < > 109   < > 112* 114* 108  CO2 27   < > 27   < > 25   < > 24   < > 21 22 18   GLUCOSE 114*   < > 87  --  93  --  103*  --   --   --   --   BUN 41*   < > 31*   < > 29*   < > 26*   < > 24* 27* 32*  CREATININE 1.96*   < > 1.28*   < > 1.26*   < > 1.13*   < > 0.8 0.9 1.2*  CALCIUM 9.2   < > 9.0   < > 9.3   < > 9.4   < > 8.5* 8.7 9.9  MG 2.2  --   --   --   --   --   --   --   --   --   --   PHOS 4.0  --   --   --   --   --   --   --   --   --   --    < > = values in this interval not displayed.   Recent Labs    03/07/19 1454 04/07/19 1530 12/07/19 0000  AST 17 31 15   ALT 10 15 6*  ALKPHOS 43 56  --   BILITOT 0.8 1.4*  --   PROT 6.6 6.9  --   ALBUMIN 4.1 4.7 4.3   Recent Labs    04/07/19 1530 04/07/19 1530 04/08/19 0021 04/08/19 0021 04/12/19 0536 05/18/19 0000 07/14/19 0000 07/14/19 0000 08/11/19 0000 08/11/19 0000 09/07/19 0000 11/09/19 0000 12/07/19 0000  WBC 8.2   < > 6.9   < > 5.3   < > 3.6   < > 5.2   < > 3.4 4.1 6.6  NEUTROABS 6.5  --   --   --   --    < > 2  --  4  --  2  --   --   HGB 13.3   < > 12.7   < > 12.9   < >  12.4   < > 12.4   < > 11.9* 11.2* 11.8*  HCT 41.2   < > 40.0   < > 42.0   < > 37   < > 37   < > 36 35* 36  MCV 95.2  --  97.6  --  100.0  --   --   --   --   --   --   --   --  PLT 75*   < > 76*   < > 70*   < > 60*  --  69*  --  63*  --   --    < > = values in this interval not displayed.   Lab Results  Component Value Date   TSH 3.58 08/22/2018   Lab Results  Component Value Date   HGBA1C 5.3 09/07/2019   Lab Results  Component Value Date   CHOL 97 08/16/2017   HDL 37.70 (L) 08/16/2017   LDLCALC 48 08/16/2017   TRIG 56.0 08/16/2017   CHOLHDL 3 08/16/2017    Significant Diagnostic Results in last 30 days:  No results found.  Assessment/Plan  1. Essential hypertension B/p controlled for her age.continue on imdur,verapamil,metoprolol tartrate and furosemide. - continue on ASA and Statin for cardiovascular event prevention.   2. Congestive heart failure, unspecified HF chronicity, unspecified heart failure type (HCC) Bilateral lower extremities  1+ edema but no cough,shortness of breath or wheezing.continue on imdur,verapamil,metoprolol tartrate and furosemide. No abrupt weight gain.   3. Longstanding persistent atrial fibrillation (HCC) HR controlled.continue on warfarin and metoprolol tartrate   4. Dementia with psychosis ([place]) No new behavioral issues reported.continue on Seroquel and escitalopram.  5. Weight loss Has had additional weight loss.continue on Ensure,magic cup,chocolate milk and encourage oral intake. - weight check weekly  6. Chronic obstructive pulmonary disease, unspecified COPD type ([place]) Breathing stable on room air.bilateral lungs clear.  Continue on Albuterol  And Dulera   7. Coronary artery disease of native artery of native heart with stable angina pectoris (HCC) Chest pain free. Continue on ASA,atorvastatin, imdur,verapamil,metoprolol tartrate and furosemide.  Family/ staff Communication: Reviewed plan with patient and facility  Nurse.  Labs/tests ordered: None

## 2020-03-05 ENCOUNTER — Non-Acute Institutional Stay (SKILLED_NURSING_FACILITY): Payer: Medicare Other | Admitting: Internal Medicine

## 2020-03-05 ENCOUNTER — Encounter: Payer: Self-pay | Admitting: Internal Medicine

## 2020-03-05 DIAGNOSIS — I5032 Chronic diastolic (congestive) heart failure: Secondary | ICD-10-CM

## 2020-03-05 DIAGNOSIS — Z87898 Personal history of other specified conditions: Secondary | ICD-10-CM | POA: Diagnosis not present

## 2020-03-05 DIAGNOSIS — D6869 Other thrombophilia: Secondary | ICD-10-CM

## 2020-03-05 DIAGNOSIS — F0392 Unspecified dementia, unspecified severity, with psychotic disturbance: Secondary | ICD-10-CM

## 2020-03-05 DIAGNOSIS — Z66 Do not resuscitate: Secondary | ICD-10-CM | POA: Diagnosis not present

## 2020-03-05 DIAGNOSIS — I4891 Unspecified atrial fibrillation: Secondary | ICD-10-CM | POA: Diagnosis not present

## 2020-03-05 DIAGNOSIS — I4811 Longstanding persistent atrial fibrillation: Secondary | ICD-10-CM

## 2020-03-05 DIAGNOSIS — F0391 Unspecified dementia with behavioral disturbance: Secondary | ICD-10-CM

## 2020-03-05 DIAGNOSIS — Z86711 Personal history of pulmonary embolism: Secondary | ICD-10-CM

## 2020-03-05 DIAGNOSIS — Z85038 Personal history of other malignant neoplasm of large intestine: Secondary | ICD-10-CM | POA: Diagnosis not present

## 2020-03-05 NOTE — Progress Notes (Signed)
Location:  [facility] Room Number: [phone] Place of Service:  SNF [id number]) Provider:  Niko Penson L. Mariea Clonts, D.O., C.M.D.  Gayland Curry, DO  Patient Care Team: Gayland Curry, DO as PCP - General (Geriatric Medicine) Ngetich, Nelda Bucks, NP as Nurse Practitioner (Family Medicine) Rehab, [place] ([place])  Extended Emergency Contact Information Primary Emergency Contact: [name],[name] Address: [address]          [place], [place] [address] [name] of [place] Phone: [phone] Mobile Phone: [phone] Relation: Daughter  Code Status: DNR Goals of care: Advanced Directive information Advanced Directives 03/05/2020  Does Patient Have a Medical Advance Directive? Yes  Type of Advance Directive Out of facility DNR (pink MOST or yellow form)  Does patient want to make changes to medical advance directive? No - Patient declined  Copy of [place] in Chart? -  Would patient like information on creating a medical advance directive? -  Pre-existing out of facility DNR order (yellow form or pink MOST form) -     Chief Complaint  Patient presents with  . Medical Management of Chronic Issues    Routine Visit     HPI:  Pt is a 86 y.o. female seen today for medical management of chronic diseases.  She is a long-term care resident of [facility] under [facility] care.  She has a h/o stroke in 2016, DVT, PE in 2006 with IVC filter placement in context of left hip fx and surgery, seizures, htn, headaches, colon cancer in 2011 with surgery and acquired short bowel syndrome, afib, CKD3, asthma, chf, lumbar DDD, CAD, pulmonary htn, cognitive impairment--sounds like probably has had dementia given she also has delusional disorder and hallucinations per psych note 09/20/19, left shoulder impingement sydrome, urinary incontinence, among others.   She participates in the restorative nursing program here with ROM and  walking exercises.  She remains on warfarin with goal INR 2-3 for multiple reasons:  Afib, DVT, and PE.  She is currently on 2mg  warfarin po daily.  Last INR was two weeks ago.   INR  drawn today:  1.9.  Has been on her same dose for a long time and has typically been therapeutic.    She is on Mon/Fri weights but sometimes refuses.  She did have significant weight loss over 90 days, but not over 30 days.  Continues Regular Liberalized Diet, Ensure TID - takes well, Magic Cup L/D meals, and chocolate milk with meals. Documented PO Intake shows most meals 25%, decline from last review. A/O to self/confusion, able to feed self with set up.   She uses a wheelchair for most of her mobility except with her restorative nursing program.    Past Medical History:  Diagnosis Date  . Acute encephalopathy   . Acute renal failure superimposed on stage 3 chronic kidney disease ([place])   . Anemia   . Atrial fibrillation ([place])   . Colon cancer ([place]) 12/11  . Colon cancer ([place]) 2011  . Diarrhea   . DVT (deep venous thrombosis) ([place]) 11/06  . Elevated brain natriuretic peptide (BNP) level   . Frequent headaches   . Heart murmur   . Hyperkalemia   . Hypertension   . Pulmonary embolism ([place]) 11/06  . Seizures ([place])   . Stroke ([place]) 11/30/2014  . Urine retention 01/2019   Past Surgical History:  Procedure Laterality Date  . APPENDECTOMY  1952  . COLECTOMY  06/2010  partial, Dr. Donne Hazel complicated by LLE CVT; S/P IVC umbrella & anemia  . HIP FRACTURE SURGERY  2006   Trauma  . TOTAL ABDOMINAL HYSTERECTOMY W/ BILATERAL SALPINGOOPHORECTOMY  1973   For Fibroids  . TOTAL HIP ARTHROPLASTY  01/03/07   Left hip replacement.  Marland Kitchen VENA CAVA FILTER PLACEMENT  06/28/2010    No Known Allergies  Outpatient Encounter Medications as of 03/05/2020  Medication Sig  . acetaminophen (TYLENOL) 500 MG tablet Take 1,000 mg by mouth every 8 (eight) hours as needed for headache (pain).   Marland Kitchen albuterol (PROVENTIL) (2.5  MG/3ML) 0.083% nebulizer solution Take 2.5 mg by nebulization every 6 (six) hours as needed for wheezing or shortness of breath.  Marland Kitchen albuterol (VENTOLIN HFA) 108 (90 Base) MCG/ACT inhaler Inhale 2 puffs into the lungs every 6 (six) hours as needed for wheezing or shortness of breath.  Marland Kitchen aspirin 81 MG chewable tablet Chew 81 mg by mouth daily.  Marland Kitchen atorvastatin (LIPITOR) 20 MG tablet Take 1 tablet (20 mg total) by mouth daily.  . Calcium Carbonate-Vitamin D (OYSTER SHELL/VITAMIN D PO) Take 1 tablet by mouth daily. With breakfast  . escitalopram (LEXAPRO) 5 MG tablet Take 5 mg by mouth daily.  . hydrocortisone (ANUSOL-HC) 2.5 % rectal cream Place 1 application rectally 2 (two) times daily as needed for hemorrhoids or anal itching.  . isosorbide mononitrate (IMDUR) 30 MG 24 hr tablet Take 15 mg by mouth daily.  Marland Kitchen loratadine (CLARITIN) 10 MG tablet Take 1 tablet (10 mg total) by mouth daily as needed (seasonal allergies).  . Metoprolol Tartrate 37.5 MG TABS Take 1 tablet by mouth 2 (two) times daily.  . mometasone-formoterol (DULERA) 100-5 MCG/ACT AERO Inhale 2 puffs into the lungs 2 (two) times daily. Gargle and rinse your mouth with water after each use of this medication to help prevent dryness,  . Multiple Vitamin (MULTIVITAMIN WITH MINERALS) TABS tablet Take 1 tablet by mouth daily.  . NON FORMULARY Diet order: Regular liberalized diet d/t poor intake.  . polyethylene glycol (MIRALAX / GLYCOLAX) packet Take 17 g by mouth daily as needed.  . promethazine (PHENERGAN) 12.5 MG tablet Take 12.5 mg by mouth 3 (three) times daily as needed for nausea or vomiting.  Marland Kitchen QUEtiapine (SEROQUEL) 50 MG tablet Take 50 mg by mouth at bedtime.  . tamsulosin (FLOMAX) 0.4 MG CAPS capsule Take 1 capsule (0.4 mg total) by mouth daily.  . verapamil (VERELAN PM) 120 MG 24 hr capsule Take 120 mg by mouth at bedtime.  Marland Kitchen warfarin (COUMADIN) 2 MG tablet Take 2 mg by mouth daily.  . [DISCONTINUED] Ensure (ENSURE) Take 237 mLs by  mouth 3 (three) times daily between meals.   No facility-administered encounter medications on file as of 03/05/2020.    Review of Systems  Constitutional: Negative for chills and fever.  HENT: Negative for nosebleeds.   Eyes: Negative for blurred vision.  Respiratory: Negative for shortness of breath.   Cardiovascular: Negative for chest pain, palpitations and leg swelling.  Gastrointestinal: Negative for abdominal pain.  Genitourinary: Negative for dysuria.  Musculoskeletal: Positive for back pain. Negative for falls.  Skin: Negative for itching and rash.  Neurological: Negative for dizziness and loss of consciousness.  Endo/Heme/Allergies: Bruises/bleeds easily.  Psychiatric/Behavioral: Positive for memory loss.       Delusions    Immunization History  Administered Date(s) Administered  . Fluad Quad(high Dose 65+) 03/07/2019  . Influenza Split 03/30/2012, 03/07/2019  . Influenza Whole 04/30/2006, 04/20/2008, 04/15/2009  . Influenza, High  Dose Seasonal PF 04/20/2013, 03/27/2015, 03/31/2016, 04/22/2017, 04/20/2018  . Influenza,inj,Quad PF,6+ Mos 05/23/2014  . Moderna SARS-COVID-2 Vaccination 07/06/2019, 08/03/2019  . Pneumococcal Conjugate-13 04/24/2015  . Pneumococcal Polysaccharide-23 07/06/2009  . Td 10/30/2016   Pertinent  Health Maintenance Due  Topic Date Due  . DEXA SCAN  Never done  . INFLUENZA VACCINE  02/04/2020  . PNA vac Low Risk Adult  Completed   Fall Risk  07/22/2018 01/18/2018 10/30/2016 08/14/2015 04/24/2015  Falls in the past year? 1 No No No No  Number falls in past yr: 1 - - - -  Injury with Fall? 0 - - - -   Functional Status Survey:    Vitals:   03/05/20 0937  BP: 122/68  Pulse: 70  Temp: (!) 97 F (36.1 C)  Weight: 145 lb 12.8 oz (66.1 kg)  Height: 5\' 3"  (1.6 m)   Body mass index is 25.83 kg/m. Physical Exam Vitals reviewed.  Constitutional:      General: She is not in acute distress.    Appearance: She is not toxic-appearing.      Comments: Was pleasant and attempted to answer my questions; was distracted by her co-resident yelling in the hallway  HENT:     Head: Normocephalic and atraumatic.     Mouth/Throat:     Comments: Poor dentition Cardiovascular:     Rate and Rhythm: Rhythm irregular.  Pulmonary:     Effort: Pulmonary effort is normal.     Breath sounds: Normal breath sounds. No rales.  Abdominal:     General: Bowel sounds are normal.     Palpations: Abdomen is soft.     Tenderness: There is no abdominal tenderness.  Musculoskeletal:     Cervical back: Neck supple.     Right lower leg: No edema.     Left lower leg: No edema.     Comments: decreased Rom left shoulder, kyphoscoliosis  Skin:    General: Skin is warm and dry.     Coloration: Skin is pale.  Neurological:     Mental Status: She is alert. Mental status is at baseline.     Comments: Resting in bed for visit  Psychiatric:        Mood and Affect: Mood normal.     Labs reviewed: Recent Labs    04/08/19 0021 04/09/19 0456 04/11/19 0556 04/11/19 0556 04/12/19 0536 04/12/19 0536 04/14/19 0532 05/18/19 0000 10/20/19 0000 11/09/19 0000 12/07/19 0000  NA 144   < > 148*   < > 147*   < > 145   < > 146 147 145  K 3.5   < > 4.3   < > 4.1   < > 3.8   < > 4.4 4.5 4.4  CL 107   < > 111   < > 108   < > 109   < > 112* 114* 108  CO2 27   < > 27   < > 25   < > 24   < > 21 22 18   GLUCOSE 114*   < > 87  --  93  --  103*  --   --   --   --   BUN 41*   < > 31*   < > 29*   < > 26*   < > 24* 27* 32*  CREATININE 1.96*   < > 1.28*   < > 1.26*   < > 1.13*   < > 0.8 0.9 1.2*  CALCIUM 9.2   < >  9.0   < > 9.3   < > 9.4   < > 8.5* 8.7 9.9  MG 2.2  --   --   --   --   --   --   --   --   --   --   PHOS 4.0  --   --   --   --   --   --   --   --   --   --    < > = values in this interval not displayed.   Recent Labs    03/07/19 1454 04/07/19 1530 12/07/19 0000  AST 17 31 15   ALT 10 15 6*  ALKPHOS 43 56  --   BILITOT 0.8 1.4*  --   PROT 6.6 6.9   --   ALBUMIN 4.1 4.7 4.3   Recent Labs    04/07/19 1530 04/07/19 1530 04/08/19 0021 04/08/19 0021 04/12/19 0536 05/18/19 0000 07/14/19 0000 07/14/19 0000 08/11/19 0000 08/11/19 0000 09/07/19 0000 11/09/19 0000 12/07/19 0000  WBC 8.2   < > 6.9   < > 5.3   < > 3.6   < > 5.2   < > 3.4 4.1 6.6  NEUTROABS 6.5  --   --   --   --    < > 2  --  4  --  2  --   --   HGB 13.3   < > 12.7   < > 12.9   < > 12.4   < > 12.4   < > 11.9* 11.2* 11.8*  HCT 41.2   < > 40.0   < > 42.0   < > 37   < > 37   < > 36 35* 36  MCV 95.2  --  97.6  --  100.0  --   --   --   --   --   --   --   --   PLT 75*   < > 76*   < > 70*   < > 60*  --  69*  --  63*  --   --    < > = values in this interval not displayed.   Lab Results  Component Value Date   TSH 3.58 08/22/2018   Lab Results  Component Value Date   HGBA1C 5.3 09/07/2019   Lab Results  Component Value Date   CHOL 97 08/16/2017   HDL 37.70 (L) 08/16/2017   LDLCALC 48 08/16/2017   TRIG 56.0 08/16/2017   CHOLHDL 3 08/16/2017     Assessment/Plan 1. Longstanding persistent atrial fibrillation (HCC) -on verapamil, metoprolol and warfarin with INR goal 2-3   2. Hypercoagulable state due to atrial fibrillation (HCC) -INR goal 2-3, last INR was 1.8 two weeks ago and recheck today 1.9, not changed this time, but if remains under 2, will need increase next time -would recommend she be on a NOAC (eliquis) instead if her daughter agrees so pt does not have to be stuck frequently for INR checks   3. History of pulmonary embolism -also reason for anticoagulation  4. History of seizures -not currently on medication for these  5. Personal history of colon cancer -s/p bowel resection and now with short gut and GI dysfunction  6. Dementia with psychosis ([place]) -likely vascular in etiology with prior stroke, afib -is on hs seroquel for delusions  7. Chronic diastolic congestive heart failure (HCC) -has not required diuretics, appears to be  euvolemic,  record indicates pulmonary htn/copd/prior PE   8. DNR (do not resuscitate) - Do not attempt resuscitation (DNR)--order confirmed and added to epic for continuity of care  Family/ staff Communication: d/w nursing staff  Labs/tests ordered:  No new, ordered by NP and last labs reviewed   [place]. Loanne Emery, D.O. [facility] [address],  [address] Cell Phone (Mon-Fri 8am-5pm):  [phone] On Call:  [phone] & follow prompts after 5pm & weekends Office Phone:  [phone] Office Fax:  [phone]

## 2020-03-09 DIAGNOSIS — R2681 Unsteadiness on feet: Secondary | ICD-10-CM | POA: Diagnosis not present

## 2020-03-09 DIAGNOSIS — M6281 Muscle weakness (generalized): Secondary | ICD-10-CM | POA: Diagnosis not present

## 2020-03-09 DIAGNOSIS — I5032 Chronic diastolic (congestive) heart failure: Secondary | ICD-10-CM | POA: Diagnosis not present

## 2020-03-11 DIAGNOSIS — I5032 Chronic diastolic (congestive) heart failure: Secondary | ICD-10-CM | POA: Diagnosis not present

## 2020-03-11 DIAGNOSIS — R2681 Unsteadiness on feet: Secondary | ICD-10-CM | POA: Diagnosis not present

## 2020-03-11 DIAGNOSIS — M6281 Muscle weakness (generalized): Secondary | ICD-10-CM | POA: Diagnosis not present

## 2020-03-12 DIAGNOSIS — M6281 Muscle weakness (generalized): Secondary | ICD-10-CM | POA: Diagnosis not present

## 2020-03-12 DIAGNOSIS — R2681 Unsteadiness on feet: Secondary | ICD-10-CM | POA: Diagnosis not present

## 2020-03-12 DIAGNOSIS — I5032 Chronic diastolic (congestive) heart failure: Secondary | ICD-10-CM | POA: Diagnosis not present

## 2020-03-13 DIAGNOSIS — I5032 Chronic diastolic (congestive) heart failure: Secondary | ICD-10-CM | POA: Diagnosis not present

## 2020-03-13 DIAGNOSIS — R2681 Unsteadiness on feet: Secondary | ICD-10-CM | POA: Diagnosis not present

## 2020-03-13 DIAGNOSIS — M6281 Muscle weakness (generalized): Secondary | ICD-10-CM | POA: Diagnosis not present

## 2020-03-15 DIAGNOSIS — M6281 Muscle weakness (generalized): Secondary | ICD-10-CM | POA: Diagnosis not present

## 2020-03-15 DIAGNOSIS — I5032 Chronic diastolic (congestive) heart failure: Secondary | ICD-10-CM | POA: Diagnosis not present

## 2020-03-15 DIAGNOSIS — R2681 Unsteadiness on feet: Secondary | ICD-10-CM | POA: Diagnosis not present

## 2020-03-18 DIAGNOSIS — M6281 Muscle weakness (generalized): Secondary | ICD-10-CM | POA: Diagnosis not present

## 2020-03-18 DIAGNOSIS — R2681 Unsteadiness on feet: Secondary | ICD-10-CM | POA: Diagnosis not present

## 2020-03-18 DIAGNOSIS — I5032 Chronic diastolic (congestive) heart failure: Secondary | ICD-10-CM | POA: Diagnosis not present

## 2020-03-19 ENCOUNTER — Non-Acute Institutional Stay (SKILLED_NURSING_FACILITY): Payer: Medicare Other | Admitting: Family

## 2020-03-19 ENCOUNTER — Encounter: Payer: Self-pay | Admitting: Family

## 2020-03-19 DIAGNOSIS — R2681 Unsteadiness on feet: Secondary | ICD-10-CM | POA: Diagnosis not present

## 2020-03-19 DIAGNOSIS — M6281 Muscle weakness (generalized): Secondary | ICD-10-CM | POA: Diagnosis not present

## 2020-03-19 DIAGNOSIS — J3489 Other specified disorders of nose and nasal sinuses: Secondary | ICD-10-CM | POA: Diagnosis not present

## 2020-03-19 DIAGNOSIS — I5032 Chronic diastolic (congestive) heart failure: Secondary | ICD-10-CM | POA: Diagnosis not present

## 2020-03-19 DIAGNOSIS — R05 Cough: Secondary | ICD-10-CM | POA: Diagnosis not present

## 2020-03-19 DIAGNOSIS — R059 Cough, unspecified: Secondary | ICD-10-CM

## 2020-03-19 DIAGNOSIS — R0981 Nasal congestion: Secondary | ICD-10-CM

## 2020-03-19 NOTE — Progress Notes (Signed)
Location:  [facility] Room Number: 205-D Place of Service:  SNF (31) Provider:  Marlowe Sax, NP  Patient Care Team: Gayland Curry, DO as PCP - General (Geriatric Medicine) Bryssa Tones, Nelda Bucks, NP as Nurse Practitioner (Family Medicine) Rehab, [place] ([place])  Extended Emergency Contact Information Primary Emergency Contact: [name],[name] Address: [address]          [place], [place] [address] [name] of [place] Phone: [phone] Mobile Phone: [phone] Relation: Daughter  Code Status:  DNR Goals of care: Advanced Directive information Advanced Directives 03/19/2020  Does Patient Have a Medical Advance Directive? Yes  Type of Advance Directive Out of facility DNR (pink MOST or yellow form)  Does patient want to make changes to medical advance directive? No - Patient declined  Copy of [place] in Chart? -  Would patient like information on creating a medical advance directive? -  Pre-existing out of facility DNR order (yellow form or pink MOST form) Yellow form placed in chart (order not valid for inpatient use)     Chief Complaint  Patient presents with  . Acute Visit    Patient is seen for increased congestion and runny nose    HPI:  Pt is a 86 y.o. female seen today for an acute visit for evaluation of increased nasal congestion,runny nose and cough.she is seen sitting at side of the bed bend over.states doesn't feel good.Has felt like she had a fever.unable to tell me how long she has had her symptoms.facility Nurse reports vital signs stable.   Past Medical History:  Diagnosis Date  . Acute encephalopathy   . Acute renal failure superimposed on stage 3 chronic kidney disease ([place])   . Anemia   . Atrial fibrillation ([place])   . Colon cancer ([place]) 12/11  . Colon cancer ([place]) 2011  . Diarrhea   . DVT (deep venous thrombosis) ([place]) 11/06  . Elevated brain  natriuretic peptide (BNP) level   . Frequent headaches   . Heart murmur   . Hyperkalemia   . Hypertension   . Pulmonary embolism ([place]) 11/06  . Seizures ([place])   . Stroke ([place]) 11/30/2014  . Urine retention 01/2019   Past Surgical History:  Procedure Laterality Date  . APPENDECTOMY  1952  . COLECTOMY  06/2010   partial, Dr. Donne Hazel complicated by LLE CVT; S/P IVC umbrella & anemia  . HIP FRACTURE SURGERY  2006   Trauma  . TOTAL ABDOMINAL HYSTERECTOMY W/ BILATERAL SALPINGOOPHORECTOMY  1973   For Fibroids  . TOTAL HIP ARTHROPLASTY  01/03/07   Left hip replacement.  Marland Kitchen VENA CAVA FILTER PLACEMENT  06/28/2010    No Known Allergies  Outpatient Encounter Medications as of 03/19/2020  Medication Sig  . acetaminophen (TYLENOL) 500 MG tablet Take 1,000 mg by mouth every 8 (eight) hours as needed for headache (pain).   Marland Kitchen albuterol (PROVENTIL) (2.5 MG/3ML) 0.083% nebulizer solution Take 2.5 mg by nebulization every 6 (six) hours as needed for wheezing or shortness of breath.  Marland Kitchen albuterol (VENTOLIN HFA) 108 (90 Base) MCG/ACT inhaler Inhale 2 puffs into the lungs every 6 (six) hours as needed for wheezing or shortness of breath.  Marland Kitchen aspirin 81 MG chewable tablet Chew 81 mg by mouth daily.  Marland Kitchen atorvastatin (LIPITOR) 20 MG tablet Take 1 tablet (20 mg total) by mouth daily.  . Calcium Carbonate-Vitamin D (OYSTER SHELL/VITAMIN D PO) Take 1 tablet by mouth daily. With breakfast  .  Ensure (ENSURE) Take 237 mLs by mouth in the morning, at noon, and at bedtime.  Marland Kitchen escitalopram (LEXAPRO) 5 MG tablet Take 5 mg by mouth daily.  . hydrocortisone (ANUSOL-HC) 2.5 % rectal cream Place 1 application rectally 2 (two) times daily as needed for hemorrhoids or anal itching.  . isosorbide mononitrate (IMDUR) 30 MG 24 hr tablet Take 15 mg by mouth daily.  Marland Kitchen loratadine (CLARITIN) 10 MG tablet Take 1 tablet (10 mg total) by mouth daily as needed (seasonal allergies).  . Metoprolol Tartrate 37.5 MG TABS Take 1 tablet by  mouth 2 (two) times daily.  . mometasone-formoterol (DULERA) 100-5 MCG/ACT AERO Inhale 2 puffs into the lungs 2 (two) times daily. Gargle and rinse your mouth with water after each use of this medication to help prevent dryness,  . Multiple Vitamin (MULTIVITAMIN WITH MINERALS) TABS tablet Take 1 tablet by mouth daily.  . NON FORMULARY Diet order: Regular liberalized diet d/t poor intake.  . Nutritional Supplements (NUTRITIONAL SUPPLEMENT PO) Take 1 each by mouth in the morning and at bedtime. Magic Cup  . polyethylene glycol (MIRALAX / GLYCOLAX) packet Take 17 g by mouth daily as needed.  . promethazine (PHENERGAN) 12.5 MG tablet Take 12.5 mg by mouth 3 (three) times daily as needed for nausea or vomiting.  Marland Kitchen QUEtiapine (SEROQUEL) 50 MG tablet Take 50 mg by mouth at bedtime.  . tamsulosin (FLOMAX) 0.4 MG CAPS capsule Take 1 capsule (0.4 mg total) by mouth daily.  . verapamil (VERELAN PM) 120 MG 24 hr capsule Take 120 mg by mouth at bedtime.  Marland Kitchen warfarin (COUMADIN) 2 MG tablet Take 2 mg by mouth daily.   No facility-administered encounter medications on file as of 03/19/2020.    Review of Systems  Unable to perform ROS: Dementia (Additional information provided by facility Nurse )    Immunization History  Administered Date(s) Administered  . Fluad Quad(high Dose 65+) 03/07/2019  . Influenza Split 03/30/2012, 03/07/2019  . Influenza Whole 04/30/2006, 04/20/2008, 04/15/2009  . Influenza, High Dose Seasonal PF 04/20/2013, 03/27/2015, 03/31/2016, 04/22/2017, 04/20/2018  . Influenza,inj,Quad PF,6+ Mos 05/23/2014  . Moderna SARS-COVID-2 Vaccination 07/06/2019, 08/03/2019  . Pneumococcal Conjugate-13 04/24/2015  . Pneumococcal Polysaccharide-23 07/06/2009  . Td 10/30/2016   Pertinent  Health Maintenance Due  Topic Date Due  . DEXA SCAN  Never done  . INFLUENZA VACCINE  02/04/2020  . PNA vac Low Risk Adult  Completed   Fall Risk  07/22/2018 01/18/2018 10/30/2016 08/14/2015 04/24/2015  Falls in  the past year? 1 No No No No  Number falls in past yr: 1 - - - -  Injury with Fall? 0 - - - -    Vitals:   03/19/20 1539  BP: 136/72  Pulse: 64  Resp: 18  Temp: 98.1 F (36.7 C)  TempSrc: Oral  Weight: 146 lb 12.8 oz (66.6 kg)  Height: 5\' 3"  (1.6 m)   Body mass index is 26 kg/m. Physical Exam Vitals reviewed.  Constitutional:      General: She is not in acute distress.    Appearance: She is not ill-appearing.  HENT:     Head: Normocephalic.     Nose: Congestion and rhinorrhea present.     Mouth/Throat:     Mouth: Mucous membranes are moist.     Pharynx: No oropharyngeal exudate or posterior oropharyngeal erythema.  Eyes:     General: No scleral icterus.       Right eye: No discharge.  Left eye: No discharge.     Conjunctiva/sclera: Conjunctivae normal.     Pupils: Pupils are equal, round, and reactive to light.  Cardiovascular:     Rate and Rhythm: Normal rate and regular rhythm.     Pulses: Normal pulses.     Heart sounds: Murmur heard.  Systolic murmur is present.  No friction rub. No gallop.   Pulmonary:     Effort: Pulmonary effort is normal. No respiratory distress.     Breath sounds: No wheezing, rhonchi or rales.     Comments: Diminished bases tough limited not taking deep breaths  Chest:     Chest wall: No tenderness.  Abdominal:     General: Bowel sounds are normal. There is no distension.     Palpations: Abdomen is soft. There is no mass.     Tenderness: There is no abdominal tenderness. There is no guarding or rebound.  Musculoskeletal:        General: No swelling or tenderness.     Comments: Unsteady trace bilateral lower extremities R>L   Skin:    General: Skin is warm.     Coloration: Skin is not pale.     Findings: No bruising, erythema or rash.  Neurological:     Mental Status: She is alert. Mental status is at baseline.     Cranial Nerves: No cranial nerve deficit.     Gait: Gait abnormal.  Psychiatric:        Mood and Affect: Mood  normal.        Behavior: Behavior normal.        Thought Content: Thought content normal.    Labs reviewed: Recent Labs    04/08/19 0021 04/09/19 0456 04/11/19 0556 04/11/19 0556 04/12/19 0536 04/12/19 0536 04/14/19 0532 05/18/19 0000 10/20/19 0000 11/09/19 0000 12/07/19 0000  NA 144   < > 148*   < > 147*   < > 145   < > 146 147 145  K 3.5   < > 4.3   < > 4.1   < > 3.8   < > 4.4 4.5 4.4  CL 107   < > 111   < > 108   < > 109   < > 112* 114* 108  CO2 27   < > 27   < > 25   < > 24   < > 21 22 18   GLUCOSE 114*   < > 87  --  93  --  103*  --   --   --   --   BUN 41*   < > 31*   < > 29*   < > 26*   < > 24* 27* 32*  CREATININE 1.96*   < > 1.28*   < > 1.26*   < > 1.13*   < > 0.8 0.9 1.2*  CALCIUM 9.2   < > 9.0   < > 9.3   < > 9.4   < > 8.5* 8.7 9.9  MG 2.2  --   --   --   --   --   --   --   --   --   --   PHOS 4.0  --   --   --   --   --   --   --   --   --   --    < > = values in this interval not displayed.   Recent Labs    04/07/19  1530 12/07/19 0000  AST 31 15  ALT 15 6*  ALKPHOS 56  --   BILITOT 1.4*  --   PROT 6.9  --   ALBUMIN 4.7 4.3   Recent Labs    04/07/19 1530 04/07/19 1530 04/08/19 0021 04/08/19 0021 04/12/19 0536 05/18/19 0000 07/14/19 0000 07/14/19 0000 08/11/19 0000 08/11/19 0000 09/07/19 0000 11/09/19 0000 12/07/19 0000  WBC 8.2   < > 6.9   < > 5.3   < > 3.6   < > 5.2   < > 3.4 4.1 6.6  NEUTROABS 6.5  --   --   --   --    < > 2  --  4  --  2  --   --   HGB 13.3   < > 12.7   < > 12.9   < > 12.4   < > 12.4   < > 11.9* 11.2* 11.8*  HCT 41.2   < > 40.0   < > 42.0   < > 37   < > 37   < > 36 35* 36  MCV 95.2  --  97.6  --  100.0  --   --   --   --   --   --   --   --   PLT 75*   < > 76*   < > 70*   < > 60*  --  69*  --  63*  --   --    < > = values in this interval not displayed.   Lab Results  Component Value Date   TSH 3.58 08/22/2018   Lab Results  Component Value Date   HGBA1C 5.3 09/07/2019   Lab Results  Component Value Date   CHOL  97 08/16/2017   HDL 37.70 (L) 08/16/2017   LDLCALC 48 08/16/2017   TRIG 56.0 08/16/2017   CHOLHDL 3 08/16/2017    Significant Diagnostic Results in last 30 days:  No results found.  Assessment/Plan  1. Cough Afebrile. Non productive cough bilateral lung bases diminish during exam limited due to not taking deep breaths during visit. - will obtain portable chest X-ray 2 views to rule out PNA  CBC/diff and BMP 03/20/2020   2. Nasal congestion with rhinorrhea Yellow nasal drainage. - encouraged to increase fluid intake  - will Loratadine 10 mg tablet to once daily - Vitamin C 500 mg tablet one by mouth daily   Family/ staff Communication: Reviewed plan of care with patient and facility Nurse   Labs/tests ordered:  CBC/diff and BMP 03/20/2020

## 2020-03-20 DIAGNOSIS — M6281 Muscle weakness (generalized): Secondary | ICD-10-CM | POA: Diagnosis not present

## 2020-03-20 DIAGNOSIS — R2681 Unsteadiness on feet: Secondary | ICD-10-CM | POA: Diagnosis not present

## 2020-03-20 DIAGNOSIS — I5032 Chronic diastolic (congestive) heart failure: Secondary | ICD-10-CM | POA: Diagnosis not present

## 2020-03-21 ENCOUNTER — Encounter: Payer: Self-pay | Admitting: Family

## 2020-03-21 ENCOUNTER — Non-Acute Institutional Stay (SKILLED_NURSING_FACILITY): Payer: Medicare Other | Admitting: Family

## 2020-03-21 DIAGNOSIS — I5032 Chronic diastolic (congestive) heart failure: Secondary | ICD-10-CM | POA: Diagnosis not present

## 2020-03-21 DIAGNOSIS — R2681 Unsteadiness on feet: Secondary | ICD-10-CM | POA: Diagnosis not present

## 2020-03-21 DIAGNOSIS — B372 Candidiasis of skin and nail: Secondary | ICD-10-CM | POA: Diagnosis not present

## 2020-03-21 DIAGNOSIS — M6281 Muscle weakness (generalized): Secondary | ICD-10-CM | POA: Diagnosis not present

## 2020-03-21 DIAGNOSIS — R058 Other specified cough: Secondary | ICD-10-CM

## 2020-03-21 DIAGNOSIS — R05 Cough: Secondary | ICD-10-CM

## 2020-03-21 NOTE — Progress Notes (Signed)
Location:    [facility].   Nursing Home Room Number: 205-W Place of Service:  SNF (31) Provider:  Marlowe Sax, NP    Patient Care Team: Gayland Curry, DO as PCP - General (Geriatric Medicine) Lexi Conaty, Nelda Bucks, NP as Nurse Practitioner (Family Medicine) [facility] ([place])  Extended Emergency Contact Information Primary Emergency Contact: [name],[name] Address: [address]          [place], [place] [address] [name] of [place] Phone: [phone] Mobile Phone: [phone] Relation: Daughter  Code Status:  DNR Goals of care: Advanced Directive information Advanced Directives 03/21/2020  Does Patient Have a Medical Advance Directive? Yes  Type of Advance Directive Out of facility DNR (pink MOST or yellow form)  Does patient want to make changes to medical advance directive? No - Patient declined  Copy of [place] in Chart? -  Would patient like information on creating a medical advance directive? -  Pre-existing out of facility DNR order (yellow form or pink MOST form) Yellow form placed in chart (order not valid for inpatient use)     Chief Complaint  Patient presents with   Acute Visit    Worsening Cough.    HPI:  Pt is a 86 y.o. female seen today for an acute visit for evaluation of worsening cough.I saw her recently on 03/19/2020 for nasal congestion,runny nose and non-productive cough. Chest X-ray,CBC/diff and BMP was ordered.loratadine 10 mg tablet was changed from PRN to daily and vit C added. CNA called provider into room states patient's cough had worsen and short of breath during morning care. Oxygen saturation 98% on room air. Portable chest X-ray was negative for cardiopulmonary abnormality though X-ray was limited due to patient's position.Nurse states patient was resisting X-ray.Also fought the lab tech unable to draw blood work.will attempt again in the morning. She is  calm during visit and co-operative.  She was observed on 03/20/2020 by staff  sitting on the floor in front of her wheelchair unclear who she fell.No injuries noted vital signs stable.seen earlier for runny nose,cough  and nasal congestion orders in place for CXR and labs.     Past Medical History:  Diagnosis Date   Acute encephalopathy    Acute renal failure superimposed on stage 3 chronic kidney disease (HCC)    Anemia    Atrial fibrillation ([place])    Colon cancer ([place]) 12/11   Colon cancer ([place]) 2011   Diarrhea    DVT (deep venous thrombosis) ([place]) 11/06   Elevated brain natriuretic peptide (BNP) level    Frequent headaches    Heart murmur    Hyperkalemia    Hypertension    Pulmonary embolism ([place]) 11/06   Seizures ([place])    Stroke ([facility]) 11/30/2014   Urine retention 01/2019   Past Surgical History:  Procedure Laterality Date   APPENDECTOMY  1952   COLECTOMY  06/2010   partial, Dr. Donne Hazel complicated by LLE CVT; S/P IVC umbrella & anemia   HIP FRACTURE SURGERY  2006   Trauma   TOTAL ABDOMINAL HYSTERECTOMY W/ BILATERAL SALPINGOOPHORECTOMY  1973   For Fibroids   TOTAL HIP ARTHROPLASTY  01/03/07   Left hip replacement.   VENA CAVA FILTER PLACEMENT  06/28/2010    No Known Allergies  Allergies as of 03/21/2020   No Known Allergies     Medication List       Accurate as of March 21, 2020 11:50 AM. If  you have any questions, ask your nurse or doctor.        acetaminophen 500 MG tablet Commonly known as: TYLENOL Take 1,000 mg by mouth every 8 (eight) hours as needed for headache (pain).   albuterol (2.5 MG/3ML) 0.083% nebulizer solution Commonly known as: PROVENTIL Take 2.5 mg by nebulization every 6 (six) hours as needed for wheezing or shortness of breath.   albuterol 108 (90 Base) MCG/ACT inhaler Commonly known as: VENTOLIN HFA Inhale 2 puffs into the lungs every 6 (six) hours as needed for wheezing or shortness of breath.   Anusol-HC  2.5 % rectal cream Generic drug: hydrocortisone Place 1 application rectally 2 (two) times daily as needed for hemorrhoids or anal itching.   aspirin 81 MG chewable tablet Chew 81 mg by mouth daily.   atorvastatin 20 MG tablet Commonly known as: LIPITOR Take 1 tablet (20 mg total) by mouth daily.   Ensure Take 237 mLs by mouth in the morning, at noon, and at bedtime.   NUTRITIONAL SUPPLEMENT PO Take 1 each by mouth in the morning and at bedtime. Magic Cup   escitalopram 10 MG tablet Commonly known as: LEXAPRO Take 10 mg by mouth daily. What changed: Another medication with the same name was removed. Continue taking this medication, and follow the directions you see here. Changed by: Sandrea Hughs, NP   isosorbide mononitrate 30 MG 24 hr tablet Commonly known as: IMDUR Take 15 mg by mouth daily.   loratadine 10 MG tablet Commonly known as: CLARITIN Take 1 tablet (10 mg total) by mouth daily as needed (seasonal allergies).   Metoprolol Tartrate 37.5 MG Tabs Take 1 tablet by mouth 2 (two) times daily.   mometasone-formoterol 100-5 MCG/ACT Aero Commonly known as: DULERA Inhale 2 puffs into the lungs 2 (two) times daily. Gargle and rinse your mouth with water after each use of this medication to help prevent dryness,   multivitamin with minerals Tabs tablet Take 1 tablet by mouth daily.   NON FORMULARY Diet order: Regular liberalized diet d/t poor intake.   OYSTER SHELL/VITAMIN D PO Take 1 tablet by mouth daily. With breakfast   polyethylene glycol 17 g packet Commonly known as: MIRALAX / GLYCOLAX Take 17 g by mouth daily as needed.   promethazine 12.5 MG tablet Commonly known as: PHENERGAN Take 12.5 mg by mouth 3 (three) times daily as needed for nausea or vomiting.   QUEtiapine 50 MG tablet Commonly known as: SEROQUEL Take 50 mg by mouth at bedtime.   tamsulosin 0.4 MG Caps capsule Commonly known as: FLOMAX Take 1 capsule (0.4 mg total) by mouth daily.     verapamil 120 MG 24 hr capsule Commonly known as: VERELAN PM Take 120 mg by mouth at bedtime.   warfarin 2 MG tablet Commonly known as: COUMADIN Take as directed by the anticoagulation clinic. If you are unsure how to take this medication, talk to your nurse or doctor. Original instructions: Take 2 mg by mouth daily.       Review of Systems  Constitutional: Negative for appetite change, chills, fatigue and fever.  HENT: Negative for congestion, rhinorrhea, sinus pressure, sinus pain, sneezing and sore throat.   Eyes: Negative for discharge, redness and itching.  Respiratory: Positive for cough and shortness of breath. Negative for chest tightness.   Cardiovascular: Negative for chest pain, palpitations and leg swelling.  Gastrointestinal: Negative for abdominal distention, abdominal pain, constipation, diarrhea, nausea and vomiting.  Musculoskeletal: Positive for gait problem. Negative for joint  swelling and myalgias.  Skin: Negative for color change, pallor and rash.  Neurological: Negative for dizziness, speech difficulty, light-headedness and headaches.  Psychiatric/Behavioral: Negative for agitation, confusion and sleep disturbance. The patient is not nervous/anxious.     Immunization History  Administered Date(s) Administered   Fluad Quad(high Dose 65+) 03/07/2019   Influenza Split 03/30/2012, 03/07/2019   Influenza Whole 04/30/2006, 04/20/2008, 04/15/2009   Influenza, High Dose Seasonal PF 04/20/2013, 03/27/2015, 03/31/2016, 04/22/2017, 04/20/2018   Influenza,inj,Quad PF,6+ Mos 05/23/2014   Moderna SARS-COVID-2 Vaccination 07/06/2019, 08/03/2019   Pneumococcal Conjugate-13 04/24/2015   Pneumococcal Polysaccharide-23 07/06/2009   Td 10/30/2016   Pertinent  Health Maintenance Due  Topic Date Due   DEXA SCAN  Never done   INFLUENZA VACCINE  02/04/2020   PNA vac Low Risk Adult  Completed   Fall Risk  07/22/2018 01/18/2018 10/30/2016 08/14/2015 04/24/2015  Falls  in the past year? 1 No No No No  Number falls in past yr: 1 - - - -  Injury with Fall? 0 - - - -    Vitals:   03/21/20 1133  BP: 136/72  Pulse: 64  Resp: 18  Temp: 98.1 F (36.7 C)  Weight: 146 lb 12.8 oz (66.6 kg)  Height: 5\' 3"  (1.6 m)   Body mass index is 26 kg/m. Physical Exam Vitals and nursing note reviewed.  HENT:     Nose: Nose normal. No congestion or rhinorrhea.     Mouth/Throat:     Mouth: Mucous membranes are moist.     Pharynx: Oropharynx is clear. No oropharyngeal exudate or posterior oropharyngeal erythema.  Eyes:     General: No scleral icterus.       Right eye: No discharge.        Left eye: No discharge.     Conjunctiva/sclera: Conjunctivae normal.     Pupils: Pupils are equal, round, and reactive to light.  Cardiovascular:     Rate and Rhythm: Normal rate. Rhythm irregular.     Pulses: Normal pulses.     Heart sounds: Normal heart sounds. No murmur heard.  No friction rub. No gallop.   Pulmonary:     Effort: Accessory muscle usage present. No respiratory distress.     Breath sounds: Wheezing and rhonchi present. No rales.  Chest:     Chest wall: No tenderness.  Abdominal:     General: Bowel sounds are normal. There is no distension.     Palpations: Abdomen is soft. There is no mass.     Tenderness: There is no abdominal tenderness. There is no guarding or rebound.  Musculoskeletal:        General: No swelling.     Right lower leg: No edema.     Left lower leg: No edema.     Comments: Unsteady gait   Skin:    General: Skin is warm.     Coloration: Skin is not pale.     Findings: Erythema present. No bruising.     Comments: Bilateral breast fold beefy skin erythema extending to upper abdomen.   Neurological:     Mental Status: She is alert. Mental status is at baseline.     Coordination: Coordination normal.     Gait: Gait abnormal.  Psychiatric:        Mood and Affect: Mood normal.        Speech: Speech normal.        Behavior: Behavior  is cooperative.        Cognition and Memory:  Memory is impaired.    Labs reviewed: Recent Labs    04/08/19 0021 04/09/19 0456 04/11/19 0556 04/11/19 0556 04/12/19 0536 04/12/19 0536 04/14/19 0532 05/18/19 0000 10/20/19 0000 11/09/19 0000 12/07/19 0000  NA 144   < > 148*   < > 147*   < > 145   < > 146 147 145  K 3.5   < > 4.3   < > 4.1   < > 3.8   < > 4.4 4.5 4.4  CL 107   < > 111   < > 108   < > 109   < > 112* 114* 108  CO2 27   < > 27   < > 25   < > 24   < > 21 22 18   GLUCOSE 114*   < > 87  --  93  --  103*  --   --   --   --   BUN 41*   < > 31*   < > 29*   < > 26*   < > 24* 27* 32*  CREATININE 1.96*   < > 1.28*   < > 1.26*   < > 1.13*   < > 0.8 0.9 1.2*  CALCIUM 9.2   < > 9.0   < > 9.3   < > 9.4   < > 8.5* 8.7 9.9  MG 2.2  --   --   --   --   --   --   --   --   --   --   PHOS 4.0  --   --   --   --   --   --   --   --   --   --    < > = values in this interval not displayed.   Recent Labs    04/07/19 1530 12/07/19 0000  AST 31 15  ALT 15 6*  ALKPHOS 56  --   BILITOT 1.4*  --   PROT 6.9  --   ALBUMIN 4.7 4.3   Recent Labs    04/07/19 1530 04/07/19 1530 04/08/19 0021 04/08/19 0021 04/12/19 0536 05/18/19 0000 07/14/19 0000 07/14/19 0000 08/11/19 0000 08/11/19 0000 09/07/19 0000 11/09/19 0000 12/07/19 0000  WBC 8.2   < > 6.9   < > 5.3   < > 3.6   < > 5.2   < > 3.4 4.1 6.6  NEUTROABS 6.5  --   --   --   --    < > 2  --  4  --  2  --   --   HGB 13.3   < > 12.7   < > 12.9   < > 12.4   < > 12.4   < > 11.9* 11.2* 11.8*  HCT 41.2   < > 40.0   < > 42.0   < > 37   < > 37   < > 36 35* 36  MCV 95.2  --  97.6  --  100.0  --   --   --   --   --   --   --   --   PLT 75*   < > 76*   < > 70*   < > 60*  --  69*  --  63*  --   --    < > = values in this interval not displayed.   Lab Results  Component Value Date  TSH 3.58 08/22/2018   Lab Results  Component Value Date   HGBA1C 5.3 09/07/2019   Lab Results  Component Value Date   CHOL 97 08/16/2017   HDL 37.70  (L) 08/16/2017   LDLCALC 48 08/16/2017   TRIG 56.0 08/16/2017   CHOLHDL 3 08/16/2017    Significant Diagnostic Results in last 30 days:  No results found.  Assessment/Plan  Nonproductive cough Afebrile. Bilateral lung wheezes and rhonchi noted on auscultation - Chest X-ray negative for cardiopulmonary abnormality though limited due to her Kyphosis-scoliosis posture and unco-operation. - Lab tech unable to draw CBC/diff and BMP but will try again in the morning. - albuterol PRN in place but has not used it - start on Duoneb solution inhale 3 mls via nebulizer every 6 hours  - mucinex 600 mg tablet one by mouth twice daily - continue to monitor vital signs will treat clinically for pneumonia if WBC are elevated or running a fever  2. Candidiasis of skin Recurrent Bilateral breast fold skin beefy redness with extension to upper abdomen. - Nystatin 100,000 units/ml cream one application to affected areas  - diflucan 150 mg tablet one by mouth x 1 dose the repeat 150 mg tablet in one week.   Family/ staff Communication: Reviewed plan with patient and facility Nurse   Labs/tests ordered: Labs order in place

## 2020-03-22 DIAGNOSIS — I10 Essential (primary) hypertension: Secondary | ICD-10-CM | POA: Diagnosis not present

## 2020-03-22 DIAGNOSIS — I5032 Chronic diastolic (congestive) heart failure: Secondary | ICD-10-CM | POA: Diagnosis not present

## 2020-03-22 DIAGNOSIS — M6281 Muscle weakness (generalized): Secondary | ICD-10-CM | POA: Diagnosis not present

## 2020-03-22 DIAGNOSIS — R2681 Unsteadiness on feet: Secondary | ICD-10-CM | POA: Diagnosis not present

## 2020-03-22 LAB — BASIC METABOLIC PANEL
BUN: 30 — AB (ref 4–21)
CO2: 23 — AB (ref 13–22)
Chloride: 109 — AB (ref 99–108)
Creatinine: 1.2 — AB (ref 0.5–1.1)
Glucose: 89
Potassium: 3.9 (ref 3.4–5.3)
Sodium: 146 (ref 137–147)

## 2020-03-22 LAB — CBC AND DIFFERENTIAL
HCT: 38 (ref 36–46)
Hemoglobin: 12.3 (ref 12.0–16.0)
Platelets: 59 — AB (ref 150–399)
WBC: 3.9

## 2020-03-22 LAB — COMPREHENSIVE METABOLIC PANEL
Calcium: 8.7 (ref 8.7–10.7)
GFR calc Af Amer: 47.22
GFR calc non Af Amer: 40.74

## 2020-03-22 LAB — CBC: RBC: 4.26 (ref 3.87–5.11)

## 2020-03-25 DIAGNOSIS — R2681 Unsteadiness on feet: Secondary | ICD-10-CM | POA: Diagnosis not present

## 2020-03-25 DIAGNOSIS — I5032 Chronic diastolic (congestive) heart failure: Secondary | ICD-10-CM | POA: Diagnosis not present

## 2020-03-25 DIAGNOSIS — M6281 Muscle weakness (generalized): Secondary | ICD-10-CM | POA: Diagnosis not present

## 2020-03-26 DIAGNOSIS — R2681 Unsteadiness on feet: Secondary | ICD-10-CM | POA: Diagnosis not present

## 2020-03-26 DIAGNOSIS — I5032 Chronic diastolic (congestive) heart failure: Secondary | ICD-10-CM | POA: Diagnosis not present

## 2020-03-26 DIAGNOSIS — M6281 Muscle weakness (generalized): Secondary | ICD-10-CM | POA: Diagnosis not present

## 2020-03-27 DIAGNOSIS — I5032 Chronic diastolic (congestive) heart failure: Secondary | ICD-10-CM | POA: Diagnosis not present

## 2020-03-27 DIAGNOSIS — M6281 Muscle weakness (generalized): Secondary | ICD-10-CM | POA: Diagnosis not present

## 2020-03-27 DIAGNOSIS — R2681 Unsteadiness on feet: Secondary | ICD-10-CM | POA: Diagnosis not present

## 2020-03-28 DIAGNOSIS — I5032 Chronic diastolic (congestive) heart failure: Secondary | ICD-10-CM | POA: Diagnosis not present

## 2020-03-28 DIAGNOSIS — M6281 Muscle weakness (generalized): Secondary | ICD-10-CM | POA: Diagnosis not present

## 2020-03-28 DIAGNOSIS — R2681 Unsteadiness on feet: Secondary | ICD-10-CM | POA: Diagnosis not present

## 2020-03-29 DIAGNOSIS — I5032 Chronic diastolic (congestive) heart failure: Secondary | ICD-10-CM | POA: Diagnosis not present

## 2020-03-29 DIAGNOSIS — M6281 Muscle weakness (generalized): Secondary | ICD-10-CM | POA: Diagnosis not present

## 2020-03-29 DIAGNOSIS — R2681 Unsteadiness on feet: Secondary | ICD-10-CM | POA: Diagnosis not present

## 2020-04-01 DIAGNOSIS — M6281 Muscle weakness (generalized): Secondary | ICD-10-CM | POA: Diagnosis not present

## 2020-04-01 DIAGNOSIS — R2681 Unsteadiness on feet: Secondary | ICD-10-CM | POA: Diagnosis not present

## 2020-04-01 DIAGNOSIS — I5032 Chronic diastolic (congestive) heart failure: Secondary | ICD-10-CM | POA: Diagnosis not present

## 2020-04-02 DIAGNOSIS — R2681 Unsteadiness on feet: Secondary | ICD-10-CM | POA: Diagnosis not present

## 2020-04-02 DIAGNOSIS — I5032 Chronic diastolic (congestive) heart failure: Secondary | ICD-10-CM | POA: Diagnosis not present

## 2020-04-02 DIAGNOSIS — M6281 Muscle weakness (generalized): Secondary | ICD-10-CM | POA: Diagnosis not present

## 2020-04-03 ENCOUNTER — Non-Acute Institutional Stay (SKILLED_NURSING_FACILITY): Payer: Medicare Other | Admitting: Family

## 2020-04-03 ENCOUNTER — Encounter: Payer: Self-pay | Admitting: Internal Medicine

## 2020-04-03 ENCOUNTER — Encounter: Payer: Self-pay | Admitting: Family

## 2020-04-03 DIAGNOSIS — B372 Candidiasis of skin and nail: Secondary | ICD-10-CM

## 2020-04-03 DIAGNOSIS — I5032 Chronic diastolic (congestive) heart failure: Secondary | ICD-10-CM | POA: Diagnosis not present

## 2020-04-03 DIAGNOSIS — R634 Abnormal weight loss: Secondary | ICD-10-CM

## 2020-04-03 DIAGNOSIS — R2681 Unsteadiness on feet: Secondary | ICD-10-CM | POA: Diagnosis not present

## 2020-04-03 DIAGNOSIS — M6281 Muscle weakness (generalized): Secondary | ICD-10-CM | POA: Diagnosis not present

## 2020-04-03 NOTE — Progress Notes (Signed)
Location:    [facility].   Nursing Home Room Number: 205-D Place of Service:  SNF (31) Provider:  Marlowe Sax, NP    Patient Care Team: Gayland Curry, DO as PCP - General (Geriatric Medicine) Adeja Sarratt, Nelda Bucks, NP as Nurse Practitioner (Family Medicine) Rehab, [facility] ([place])  Extended Emergency Contact Information Primary Emergency Contact: [name],[name] Address: [address]          [place],  [address] [name] Phone: [phone] Mobile Phone: [phone] Relation: Daughter  Code Status:  DNR Goals of care: Advanced Directive information Advanced Directives 04/03/2020  Does Patient Have a Medical Advance Directive? Yes  Type of Advance Directive Out of facility DNR (pink MOST or yellow form)  Does patient want to make changes to medical advance directive? No - Patient declined  Copy of [place] in Chart? -  Would patient like information on creating a medical advance directive? -  Pre-existing out of facility DNR order (yellow form or pink MOST form) Yellow form placed in chart (order not valid for inpatient use)     Chief Complaint  Patient presents with  . Acute Visit    Weight Loss.    HPI:  Pt is a 86 y.o. female seen today for an acute visit for weight loss.she is seen in her room awake in bed.she denies any acute issues this visit.States feeling much better since treated for pneumonia 03/21/2020.cough has resolved and no more wheezing and shortness of breath.Facility Nurse reports patient has had weight loss of 9.6 lbs over 5 days.she denies any fever or chills. States appetite has improved. Weight log reviewed :  Weight 133 lbs ( 04/02/2020 )  Weight 142.6  lbs ( 03/28/2020 ) Weight 143 lbs ( 03/25/2020 ) Weight 146.8 lbs ( 03/14/2020 ) Weight 145.8 lbs ( 03/11/2020 ) Weight 146  lbs ( 03/08/2020 )   Past Medical History:  Diagnosis Date  . Acute  encephalopathy   . Acute renal failure superimposed on stage 3 chronic kidney disease ([place])   . Anemia   . Atrial fibrillation ([place])   . Colon cancer ([place]) 12/11  . Colon cancer ([place]) 2011  . Diarrhea   . DVT (deep venous thrombosis) ([place]) 11/06  . Elevated brain natriuretic peptide (BNP) level   . Frequent headaches   . Heart murmur   . Hyperkalemia   . Hypertension   . Pulmonary embolism ([place]) 11/06  . Seizures ([place])   . Stroke ([place]) 11/30/2014  . Urine retention 01/2019   Past Surgical History:  Procedure Laterality Date  . APPENDECTOMY  1952  . COLECTOMY  06/2010   partial, Dr. Donne Hazel complicated by LLE CVT; S/P IVC umbrella & anemia  . HIP FRACTURE SURGERY  2006   Trauma  . TOTAL ABDOMINAL HYSTERECTOMY W/ BILATERAL SALPINGOOPHORECTOMY  1973   For Fibroids  . TOTAL HIP ARTHROPLASTY  01/03/07   Left hip replacement.  Marland Kitchen VENA CAVA FILTER PLACEMENT  06/28/2010    No Known Allergies  Allergies as of 04/03/2020   No Known Allergies     Medication List       Accurate as of April 03, 2020 10:58 AM. If you have any questions, ask your nurse or doctor.        STOP taking these medications   warfarin 2 MG tablet Commonly known as: COUMADIN Stopped by: Sandrea Hughs, NP     TAKE these medications  acetaminophen 500 MG tablet Commonly known as: TYLENOL Take 1,000 mg by mouth every 8 (eight) hours as needed for headache (pain).   albuterol (2.5 MG/3ML) 0.083% nebulizer solution Commonly known as: PROVENTIL Take 2.5 mg by nebulization every 6 (six) hours as needed for wheezing or shortness of breath.   albuterol 108 (90 Base) MCG/ACT inhaler Commonly known as: VENTOLIN HFA Inhale 2 puffs into the lungs every 6 (six) hours as needed for wheezing or shortness of breath.   Anusol-HC 2.5 % rectal cream Generic drug: hydrocortisone Place 1 application rectally 2 (two) times daily as needed for hemorrhoids or anal itching.   aspirin 81 MG chewable  tablet Chew 81 mg by mouth daily.   atorvastatin 20 MG tablet Commonly known as: LIPITOR Take 1 tablet (20 mg total) by mouth daily.   Ensure Take 237 mLs by mouth in the morning, at noon, and at bedtime.   NUTRITIONAL SUPPLEMENT PO Take 1 each by mouth in the morning and at bedtime. Magic Cup   escitalopram 10 MG tablet Commonly known as: LEXAPRO Take 10 mg by mouth daily.   ipratropium-albuterol 0.5-2.5 (3) MG/3ML Soln Commonly known as: DUONEB Take 3 mLs by nebulization every 6 (six) hours. For coughing, wheezing, and shortness of breath.   isosorbide mononitrate 30 MG 24 hr tablet Commonly known as: IMDUR Take 15 mg by mouth daily.   loratadine 10 MG tablet Commonly known as: CLARITIN Take 10 mg by mouth daily. What changed: Another medication with the same name was removed. Continue taking this medication, and follow the directions you see here. Changed by: Sandrea Hughs, NP   Metoprolol Tartrate 37.5 MG Tabs Take 1 tablet by mouth 2 (two) times daily.   mometasone-formoterol 100-5 MCG/ACT Aero Commonly known as: DULERA Inhale 2 puffs into the lungs 2 (two) times daily. Gargle and rinse your mouth with water after each use of this medication to help prevent dryness,   MUCINEX ALLERGY PO Take 600 mg by mouth in the morning and at bedtime. For cough.   multivitamin with minerals Tabs tablet Take 1 tablet by mouth daily.   NON FORMULARY Diet order: Regular liberalized diet d/t poor intake.   nystatin cream Commonly known as: MYCOSTATIN Apply 1 application topically. To affected areas on bilateral breast folds until skin redness resolve.   OYSTER SHELL/VITAMIN D PO Take 1 tablet by mouth daily. With breakfast   polyethylene glycol 17 g packet Commonly known as: MIRALAX / GLYCOLAX Take 17 g by mouth daily as needed.   promethazine 12.5 MG tablet Commonly known as: PHENERGAN Take 12.5 mg by mouth 3 (three) times daily as needed for nausea or vomiting.    QUEtiapine 50 MG tablet Commonly known as: SEROQUEL Take 50 mg by mouth at bedtime.   tamsulosin 0.4 MG Caps capsule Commonly known as: FLOMAX Take 1 capsule (0.4 mg total) by mouth daily.   verapamil 120 MG 24 hr capsule Commonly known as: VERELAN PM Take 120 mg by mouth at bedtime.       Review of Systems  Constitutional: Positive for unexpected weight change. Negative for appetite change, chills, diaphoresis, fatigue and fever.       Weight loss per HPI   HENT: Negative for congestion, rhinorrhea, sinus pressure, sinus pain, sneezing, sore throat and trouble swallowing.   Respiratory: Negative for cough, chest tightness, shortness of breath and wheezing.   Cardiovascular: Negative for chest pain, palpitations and leg swelling.  Gastrointestinal: Negative for abdominal distention, abdominal pain,  constipation, diarrhea, nausea and vomiting.  Genitourinary: Negative for difficulty urinating, dysuria and urgency.       Incontinent   Musculoskeletal: Positive for gait problem. Negative for joint swelling and myalgias.  Skin: Negative for color change, pallor and rash.  Neurological: Negative for dizziness, speech difficulty, light-headedness and headaches.  Psychiatric/Behavioral: Negative for agitation, confusion and sleep disturbance. The patient is not nervous/anxious.     Immunization History  Administered Date(s) Administered  . Fluad Quad(high Dose 65+) 03/07/2019  . Influenza Split 03/30/2012, 03/07/2019  . Influenza Whole 04/30/2006, 04/20/2008, 04/15/2009  . Influenza, High Dose Seasonal PF 04/20/2013, 03/27/2015, 03/31/2016, 04/22/2017, 04/20/2018  . Influenza,inj,Quad PF,6+ Mos 05/23/2014  . Moderna SARS-COVID-2 Vaccination 07/06/2019, 08/03/2019  . Pneumococcal Conjugate-13 04/24/2015  . Pneumococcal Polysaccharide-23 07/06/2009  . Td 10/30/2016   Pertinent  Health Maintenance Due  Topic Date Due  . DEXA SCAN  Never done  . INFLUENZA VACCINE  02/04/2020  .  PNA vac Low Risk Adult  Completed   Fall Risk  07/22/2018 01/18/2018 10/30/2016 08/14/2015 04/24/2015  Falls in the past year? 1 No No No No  Number falls in past yr: 1 - - - -  Injury with Fall? 0 - - - -   Functional Status Survey:    Vitals:   04/03/20 1005  BP: 136/76  Pulse: 76  Resp: 19  Temp: (!) 97.2 F (36.2 C)  SpO2: 97%  Weight: 142 lb 9.6 oz (64.7 kg)  Height: 5\' 3"  (1.6 m)   Body mass index is 25.26 kg/m. Physical Exam Vitals and nursing note reviewed.  Constitutional:      General: She is not in acute distress.    Appearance: She is not ill-appearing.  HENT:     Head: Normocephalic.     Nose: Nose normal. No congestion or rhinorrhea.     Mouth/Throat:     Mouth: Mucous membranes are moist.     Pharynx: Oropharynx is clear. No oropharyngeal exudate or posterior oropharyngeal erythema.  Eyes:     General: No scleral icterus.       Right eye: No discharge.        Left eye: No discharge.     Conjunctiva/sclera: Conjunctivae normal.     Pupils: Pupils are equal, round, and reactive to light.  Cardiovascular:     Rate and Rhythm: Normal rate. Rhythm irregular.     Pulses: Normal pulses.     Heart sounds: Normal heart sounds. No murmur heard.  No friction rub. No gallop.   Pulmonary:     Effort: Pulmonary effort is normal. No respiratory distress.     Breath sounds: Normal breath sounds. No wheezing, rhonchi or rales.  Chest:     Chest wall: No tenderness.  Abdominal:     General: Bowel sounds are normal. There is no distension.     Palpations: Abdomen is soft. There is no mass.     Tenderness: There is no abdominal tenderness. There is no right CVA tenderness, left CVA tenderness, guarding or rebound.  Musculoskeletal:        General: No swelling or tenderness.     Cervical back: No rigidity or tenderness.     Right lower leg: No edema.     Left lower leg: No edema.     Comments: Moves x 4 extremities in bed.   Lymphadenopathy:     Cervical: No cervical  adenopathy.  Skin:    General: Skin is warm and dry.     Coloration:  Skin is not pale.     Findings: No bruising or erythema.     Comments: Beefy skin redness has improved except left breast skin fold persist.   Neurological:     Mental Status: She is alert. Mental status is at baseline.     Cranial Nerves: No cranial nerve deficit.     Sensory: No sensory deficit.     Coordination: Coordination normal.     Gait: Gait abnormal.     Labs reviewed: Recent Labs    04/08/19 0021 04/09/19 0456 04/11/19 0556 04/11/19 0556 04/12/19 0536 04/12/19 0536 04/14/19 0532 05/18/19 0000 11/09/19 0000 12/07/19 0000 03/22/20 0000  NA 144   < > 148*   < > 147*   < > 145   < > 147 145 146  K 3.5   < > 4.3   < > 4.1   < > 3.8   < > 4.5 4.4 3.9  CL 107   < > 111   < > 108   < > 109   < > 114* 108 109*  CO2 27   < > 27   < > 25   < > 24   < > 22 18 23*  GLUCOSE 114*   < > 87  --  93  --  103*  --   --   --   --   BUN 41*   < > 31*   < > 29*   < > 26*   < > 27* 32* 30*  CREATININE 1.96*   < > 1.28*   < > 1.26*   < > 1.13*   < > 0.9 1.2* 1.2*  CALCIUM 9.2   < > 9.0   < > 9.3   < > 9.4   < > 8.7 9.9 8.7  MG 2.2  --   --   --   --   --   --   --   --   --   --   PHOS 4.0  --   --   --   --   --   --   --   --   --   --    < > = values in this interval not displayed.   Recent Labs    04/07/19 1530 12/07/19 0000  AST 31 15  ALT 15 6*  ALKPHOS 56  --   BILITOT 1.4*  --   PROT 6.9  --   ALBUMIN 4.7 4.3   Recent Labs    04/07/19 1530 04/07/19 1530 04/08/19 0021 04/08/19 0021 04/12/19 0536 05/18/19 0000 07/14/19 0000 07/14/19 0000 08/11/19 0000 08/11/19 0000 09/07/19 0000 09/07/19 0000 11/09/19 0000 12/07/19 0000 03/22/20 0000  WBC 8.2   < > 6.9   < > 5.3   < > 3.6   < > 5.2   < > 3.4   < > 4.1 6.6 3.9  NEUTROABS 6.5  --   --   --   --    < > 2  --  4  --  2  --   --   --   --   HGB 13.3   < > 12.7   < > 12.9   < > 12.4   < > 12.4   < > 11.9*   < > 11.2* 11.8* 12.3  HCT 41.2    < > 40.0   < > 42.0   < > 37   < >  37   < > 36   < > 35* 36 38  MCV 95.2  --  97.6  --  100.0  --   --   --   --   --   --   --   --   --   --   PLT 75*   < > 76*   < > 70*   < > 60*   < > 69*  --  63*  --   --   --  59*   < > = values in this interval not displayed.   Lab Results  Component Value Date   TSH 3.58 08/22/2018   Lab Results  Component Value Date   HGBA1C 5.3 09/07/2019   Lab Results  Component Value Date   CHOL 97 08/16/2017   HDL 37.70 (L) 08/16/2017   LDLCALC 48 08/16/2017   TRIG 56.0 08/16/2017   CHOLHDL 3 08/16/2017    Significant Diagnostic Results in last 30 days:  No results found.  Assessment/Plan 1. Weight loss, abnormal Has had a 9.6 lbs weight loss but was not eating well with recent illness.appetite has improved now Nurse reports oral intake better today. Will continue to monitor weight for now then start on Appetite stimulant if weight trending down.   2. Candidiasis of skin Right breast skin fold has improved but still has some beefy redness on the left breast fold.continue on Nystatin cream until redness resolve.  Family/ staff Communication: Reviewed plan of care with patient and facility Nurse   Labs/tests ordered: None

## 2020-04-04 DIAGNOSIS — I5032 Chronic diastolic (congestive) heart failure: Secondary | ICD-10-CM | POA: Diagnosis not present

## 2020-04-04 DIAGNOSIS — R2681 Unsteadiness on feet: Secondary | ICD-10-CM | POA: Diagnosis not present

## 2020-04-04 DIAGNOSIS — M6281 Muscle weakness (generalized): Secondary | ICD-10-CM | POA: Diagnosis not present

## 2020-04-05 DIAGNOSIS — R2681 Unsteadiness on feet: Secondary | ICD-10-CM | POA: Diagnosis not present

## 2020-04-05 DIAGNOSIS — I5032 Chronic diastolic (congestive) heart failure: Secondary | ICD-10-CM | POA: Diagnosis not present

## 2020-04-05 DIAGNOSIS — I129 Hypertensive chronic kidney disease with stage 1 through stage 4 chronic kidney disease, or unspecified chronic kidney disease: Secondary | ICD-10-CM | POA: Diagnosis not present

## 2020-04-05 DIAGNOSIS — M6281 Muscle weakness (generalized): Secondary | ICD-10-CM | POA: Diagnosis not present

## 2020-04-05 DIAGNOSIS — Z85038 Personal history of other malignant neoplasm of large intestine: Secondary | ICD-10-CM | POA: Diagnosis not present

## 2020-04-07 DIAGNOSIS — M6281 Muscle weakness (generalized): Secondary | ICD-10-CM | POA: Diagnosis not present

## 2020-04-07 DIAGNOSIS — I5032 Chronic diastolic (congestive) heart failure: Secondary | ICD-10-CM | POA: Diagnosis not present

## 2020-04-07 DIAGNOSIS — Z85038 Personal history of other malignant neoplasm of large intestine: Secondary | ICD-10-CM | POA: Diagnosis not present

## 2020-04-07 DIAGNOSIS — I129 Hypertensive chronic kidney disease with stage 1 through stage 4 chronic kidney disease, or unspecified chronic kidney disease: Secondary | ICD-10-CM | POA: Diagnosis not present

## 2020-04-07 DIAGNOSIS — R2681 Unsteadiness on feet: Secondary | ICD-10-CM | POA: Diagnosis not present

## 2020-04-08 DIAGNOSIS — I5032 Chronic diastolic (congestive) heart failure: Secondary | ICD-10-CM | POA: Diagnosis not present

## 2020-04-08 DIAGNOSIS — Z85038 Personal history of other malignant neoplasm of large intestine: Secondary | ICD-10-CM | POA: Diagnosis not present

## 2020-04-08 DIAGNOSIS — M6281 Muscle weakness (generalized): Secondary | ICD-10-CM | POA: Diagnosis not present

## 2020-04-08 DIAGNOSIS — R2681 Unsteadiness on feet: Secondary | ICD-10-CM | POA: Diagnosis not present

## 2020-04-08 DIAGNOSIS — I129 Hypertensive chronic kidney disease with stage 1 through stage 4 chronic kidney disease, or unspecified chronic kidney disease: Secondary | ICD-10-CM | POA: Diagnosis not present

## 2020-04-09 DIAGNOSIS — Z85038 Personal history of other malignant neoplasm of large intestine: Secondary | ICD-10-CM | POA: Diagnosis not present

## 2020-04-09 DIAGNOSIS — I5032 Chronic diastolic (congestive) heart failure: Secondary | ICD-10-CM | POA: Diagnosis not present

## 2020-04-09 DIAGNOSIS — R2681 Unsteadiness on feet: Secondary | ICD-10-CM | POA: Diagnosis not present

## 2020-04-09 DIAGNOSIS — I129 Hypertensive chronic kidney disease with stage 1 through stage 4 chronic kidney disease, or unspecified chronic kidney disease: Secondary | ICD-10-CM | POA: Diagnosis not present

## 2020-04-09 DIAGNOSIS — M6281 Muscle weakness (generalized): Secondary | ICD-10-CM | POA: Diagnosis not present

## 2020-04-10 ENCOUNTER — Non-Acute Institutional Stay (INDEPENDENT_AMBULATORY_CARE_PROVIDER_SITE_OTHER): Payer: Medicare Other | Admitting: Family

## 2020-04-10 ENCOUNTER — Encounter: Payer: Self-pay | Admitting: Family

## 2020-04-10 DIAGNOSIS — I4811 Longstanding persistent atrial fibrillation: Secondary | ICD-10-CM | POA: Diagnosis not present

## 2020-04-10 DIAGNOSIS — I25118 Atherosclerotic heart disease of native coronary artery with other forms of angina pectoris: Secondary | ICD-10-CM | POA: Diagnosis not present

## 2020-04-10 DIAGNOSIS — I5032 Chronic diastolic (congestive) heart failure: Secondary | ICD-10-CM

## 2020-04-10 DIAGNOSIS — I10 Essential (primary) hypertension: Secondary | ICD-10-CM | POA: Diagnosis not present

## 2020-04-10 DIAGNOSIS — R2681 Unsteadiness on feet: Secondary | ICD-10-CM | POA: Diagnosis not present

## 2020-04-10 DIAGNOSIS — Z85038 Personal history of other malignant neoplasm of large intestine: Secondary | ICD-10-CM | POA: Diagnosis not present

## 2020-04-10 DIAGNOSIS — I129 Hypertensive chronic kidney disease with stage 1 through stage 4 chronic kidney disease, or unspecified chronic kidney disease: Secondary | ICD-10-CM | POA: Diagnosis not present

## 2020-04-10 DIAGNOSIS — R1312 Dysphagia, oropharyngeal phase: Secondary | ICD-10-CM | POA: Diagnosis not present

## 2020-04-10 DIAGNOSIS — Z87898 Personal history of other specified conditions: Secondary | ICD-10-CM | POA: Diagnosis not present

## 2020-04-10 DIAGNOSIS — M6281 Muscle weakness (generalized): Secondary | ICD-10-CM | POA: Diagnosis not present

## 2020-04-10 DIAGNOSIS — Z789 Other specified health status: Secondary | ICD-10-CM | POA: Diagnosis not present

## 2020-04-10 LAB — LIPID PANEL
Cholesterol: 86 (ref 0–200)
HDL: 34 — AB (ref 35–70)
LDL Cholesterol: 45
Triglycerides: 36 — AB (ref 40–160)

## 2020-04-10 NOTE — Progress Notes (Signed)
Location:    [place].   Nursing Home Room Number: 205-D Place of Service:  SNF (31) Provider:  Marlowe Sax, NP     Patient Care Team: Gayland Curry, DO as PCP - General (Geriatric Medicine) Collis Thede, Nelda Bucks, NP as Nurse Practitioner (Family Medicine) Rehab, [facility] ([place])  Extended Emergency Contact Information Primary Emergency Contact: [name],[name] Address: [address]          [place], [place] [address] [name] of [place] Phone: [phone] Mobile Phone: [phone] Relation: Daughter  Code Status:  DNR Goals of care: Advanced Directive information Advanced Directives 04/10/2020  Does Patient Have a Medical Advance Directive? Yes  Type of Advance Directive Out of facility DNR (pink MOST or yellow form)  Does patient want to make changes to medical advance directive? No - Patient declined  Copy of [place] in Chart? -  Would patient like information on creating a medical advance directive? -  Pre-existing out of facility DNR order (yellow form or pink MOST form) Yellow form placed in chart (order not valid for inpatient use)     Chief Complaint  Patient presents with  . Medical Management of Chronic Issues    Routine Visit.   Marland Kitchen Health Maintenance    Discuss the need for Dexa Scan.  . Immunizations    Discuss the need for Infleunza Vaccine.     HPI:  Pt is a 86 y.o. female seen today for medical management of chronic diseases.she is seen in her room in bed.she denies any acute issues during visit.facility Nurse reports no new concerns.she is due for bone density but will defer since she is non-ambulatory.she requires assistance with transfers to wheelchair self propels on wheelchair using her feet and pulling on the rail on the wall. She states appetite has improved.she goes to the facility dinning room for meals.she had previous weight loss last month but weight has been  stable in the 130's for the past two weeks.     Past Medical History:  Diagnosis Date  . Acute encephalopathy   . Acute renal failure superimposed on stage 3 chronic kidney disease ([place])   . Anemia   . Atrial fibrillation ([place])   . Colon cancer ([place]) 12/11  . Colon cancer ([place]) 2011  . Diarrhea   . DVT (deep venous thrombosis) ([place]) 11/06  . Elevated brain natriuretic peptide (BNP) level   . Frequent headaches   . Heart murmur   . Hyperkalemia   . Hypertension   . Pulmonary embolism ([place]) 11/06  . Seizures ([place])   . Stroke ([place]) 11/30/2014  . Urine retention 01/2019   Past Surgical History:  Procedure Laterality Date  . APPENDECTOMY  1952  . COLECTOMY  06/2010   partial, Dr. Donne Hazel complicated by LLE CVT; S/P IVC umbrella & anemia  . HIP FRACTURE SURGERY  2006   Trauma  . TOTAL ABDOMINAL HYSTERECTOMY W/ BILATERAL SALPINGOOPHORECTOMY  1973   For Fibroids  . TOTAL HIP ARTHROPLASTY  01/03/07   Left hip replacement.  Marland Kitchen VENA CAVA FILTER PLACEMENT  06/28/2010    No Known Allergies  Allergies as of 04/10/2020   No Known Allergies     Medication List       Accurate as of April 10, 2020  2:55 PM. If you have any questions, ask your nurse or doctor.        STOP taking these medications   MUCINEX ALLERGY PO Stopped  by: Sandrea Hughs, NP     TAKE these medications   acetaminophen 500 MG tablet Commonly known as: TYLENOL Take 1,000 mg by mouth every 8 (eight) hours as needed for headache (pain).   albuterol (2.5 MG/3ML) 0.083% nebulizer solution Commonly known as: PROVENTIL Take 2.5 mg by nebulization every 6 (six) hours as needed for wheezing or shortness of breath.   albuterol 108 (90 Base) MCG/ACT inhaler Commonly known as: VENTOLIN HFA Inhale 2 puffs into the lungs every 6 (six) hours as needed for wheezing or shortness of breath.   Anusol-HC 2.5 % rectal cream Generic drug: hydrocortisone Place 1 application rectally 2 (two) times daily as needed for  hemorrhoids or anal itching.   aspirin 81 MG chewable tablet Chew 81 mg by mouth daily.   atorvastatin 20 MG tablet Commonly known as: LIPITOR Take 1 tablet (20 mg total) by mouth daily.   Ensure Take 237 mLs by mouth in the morning, at noon, and at bedtime.   NUTRITIONAL SUPPLEMENT PO Take 1 each by mouth in the morning and at bedtime. Magic Cup   escitalopram 10 MG tablet Commonly known as: LEXAPRO Take 10 mg by mouth daily.   guaiFENesin 600 MG 12 hr tablet Commonly known as: MUCINEX Take 1,200 mg by mouth 2 (two) times daily.   ipratropium-albuterol 0.5-2.5 (3) MG/3ML Soln Commonly known as: DUONEB Take 3 mLs by nebulization every 6 (six) hours. For coughing, wheezing, and shortness of breath.   isosorbide mononitrate 30 MG 24 hr tablet Commonly known as: IMDUR Take 15 mg by mouth daily.   loratadine 10 MG tablet Commonly known as: CLARITIN Take 10 mg by mouth daily.   Metoprolol Tartrate 37.5 MG Tabs Take 1 tablet by mouth 2 (two) times daily.   mometasone-formoterol 100-5 MCG/ACT Aero Commonly known as: DULERA Inhale 2 puffs into the lungs 2 (two) times daily. Gargle and rinse your mouth with water after each use of this medication to help prevent dryness,   multivitamin with minerals Tabs tablet Take 1 tablet by mouth daily.   NON FORMULARY Diet order: Regular liberalized diet d/t poor intake.   nystatin cream Commonly known as: MYCOSTATIN Apply 1 application topically. To affected areas on bilateral breast folds until skin redness resolve.   OYSTER SHELL/VITAMIN D PO Take 1 tablet by mouth daily. With breakfast   polyethylene glycol 17 g packet Commonly known as: MIRALAX / GLYCOLAX Take 17 g by mouth daily as needed.   promethazine 12.5 MG tablet Commonly known as: PHENERGAN Take 12.5 mg by mouth 3 (three) times daily as needed for nausea or vomiting.   QUEtiapine 50 MG tablet Commonly known as: SEROQUEL Take 50 mg by mouth at bedtime.     tamsulosin 0.4 MG Caps capsule Commonly known as: FLOMAX Take 1 capsule (0.4 mg total) by mouth daily.   verapamil 120 MG 24 hr capsule Commonly known as: VERELAN PM Take 120 mg by mouth at bedtime.   warfarin 2 MG tablet Commonly known as: COUMADIN Take as directed by the anticoagulation clinic. If you are unsure how to take this medication, talk to your nurse or doctor. Original instructions: Take 2 mg by mouth daily.       Review of Systems  Constitutional: Negative for appetite change, chills, fatigue and fever.  HENT: Positive for hearing loss. Negative for congestion, postnasal drip, rhinorrhea, sinus pressure, sinus pain, sneezing, sore throat and trouble swallowing.   Eyes: Negative for discharge, redness and itching.  Respiratory: Negative  for cough, chest tightness, shortness of breath and wheezing.   Cardiovascular: Negative for chest pain, palpitations and leg swelling.  Gastrointestinal: Negative for abdominal distention, abdominal pain, constipation, diarrhea, nausea and vomiting.  Endocrine: Negative for cold intolerance, heat intolerance, polydipsia, polyphagia and polyuria.  Genitourinary: Negative for difficulty urinating, dysuria, flank pain and urgency.  Musculoskeletal: Positive for gait problem. Negative for joint swelling and myalgias.  Skin: Negative for color change, pallor, rash and wound.  Neurological: Negative for dizziness, speech difficulty, light-headedness, numbness and headaches.  Hematological: Does not bruise/bleed easily.  Psychiatric/Behavioral: Negative for agitation, behavioral problems, confusion and sleep disturbance. The patient is not nervous/anxious.     Immunization History  Administered Date(s) Administered  . Fluad Quad(high Dose 65+) 03/07/2019  . Influenza Split 03/30/2012, 03/07/2019  . Influenza Whole 04/30/2006, 04/20/2008, 04/15/2009  . Influenza, High Dose Seasonal PF 04/20/2013, 03/27/2015, 03/31/2016, 04/22/2017,  04/20/2018  . Influenza,inj,Quad PF,6+ Mos 05/23/2014  . Moderna SARS-COVID-2 Vaccination 07/06/2019, 08/03/2019  . Pneumococcal Conjugate-13 04/24/2015  . Pneumococcal Polysaccharide-23 07/06/2009  . Td 10/30/2016   Pertinent  Health Maintenance Due  Topic Date Due  . DEXA SCAN  Never done  . INFLUENZA VACCINE  02/04/2020  . PNA vac Low Risk Adult  Completed   Fall Risk  07/22/2018 01/18/2018 10/30/2016 08/14/2015 04/24/2015  Falls in the past year? 1 No No No No  Number falls in past yr: 1 - - - -  Injury with Fall? 0 - - - -   Functional Status Survey:    Vitals:   04/10/20 1438  BP: 134/68  Pulse: 80  Resp: 18  Temp: (!) 97.2 F (36.2 C)  Weight: 133 lb 12.8 oz (60.7 kg)  Height: 5\' 3"  (1.6 m)   Body mass index is 23.7 kg/m. Physical Exam Vitals and nursing note reviewed.  Constitutional:      General: She is not in acute distress.    Appearance: She is not ill-appearing.  HENT:     Head: Normocephalic.     Nose: Nose normal. No congestion or rhinorrhea.     Mouth/Throat:     Mouth: Mucous membranes are moist.     Pharynx: Oropharynx is clear. No oropharyngeal exudate or posterior oropharyngeal erythema.  Eyes:     General: No scleral icterus.       Right eye: No discharge.        Left eye: No discharge.     Extraocular Movements: Extraocular movements intact.     Conjunctiva/sclera: Conjunctivae normal.     Pupils: Pupils are equal, round, and reactive to light.  Cardiovascular:     Rate and Rhythm: Normal rate. Rhythm irregular.     Pulses: Normal pulses.     Heart sounds: Normal heart sounds. No murmur heard.  No friction rub. No gallop.   Pulmonary:     Effort: Pulmonary effort is normal. No respiratory distress.     Breath sounds: Normal breath sounds. No wheezing, rhonchi or rales.  Chest:     Chest wall: No tenderness.  Abdominal:     General: Bowel sounds are normal. There is no distension.     Palpations: Abdomen is soft. There is no mass.      Tenderness: There is no abdominal tenderness. There is no right CVA tenderness, left CVA tenderness, guarding or rebound.  Musculoskeletal:        General: No swelling or tenderness.     Cervical back: Normal range of motion. No rigidity or tenderness.  Thoracic back: Deformity present.     Right lower leg: No edema.     Left lower leg: No edema.     Comments: Requires assistance with transfer to wheelchair.thoracic spine concave curvature.   Lymphadenopathy:     Cervical: No cervical adenopathy.  Skin:    General: Skin is warm and dry.     Coloration: Skin is not pale.     Findings: No bruising, erythema or rash.  Neurological:     Mental Status: She is alert. Mental status is at baseline.     Cranial Nerves: No cranial nerve deficit.     Sensory: No sensory deficit.     Coordination: Coordination normal.     Gait: Gait abnormal.  Psychiatric:        Mood and Affect: Mood normal.        Behavior: Behavior normal.        Thought Content: Thought content normal.     Labs reviewed: Recent Labs    04/12/19 0536 04/12/19 0536 04/14/19 0532 05/18/19 0000 11/09/19 0000 12/07/19 0000 03/22/20 0000  NA 147*   < > 145   < > 147 145 146  K 4.1   < > 3.8   < > 4.5 4.4 3.9  CL 108   < > 109   < > 114* 108 109*  CO2 25   < > 24   < > 22 18 23*  GLUCOSE 93  --  103*  --   --   --   --   BUN 29*   < > 26*   < > 27* 32* 30*  CREATININE 1.26*   < > 1.13*   < > 0.9 1.2* 1.2*  CALCIUM 9.3   < > 9.4   < > 8.7 9.9 8.7   < > = values in this interval not displayed.   Recent Labs    12/07/19 0000  AST 15  ALT 6*  ALBUMIN 4.3   Recent Labs    04/12/19 0536 05/18/19 0000 07/14/19 0000 07/14/19 0000 08/11/19 0000 08/11/19 0000 09/07/19 0000 09/07/19 0000 11/09/19 0000 12/07/19 0000 03/22/20 0000  WBC 5.3   < > 3.6   < > 5.2   < > 3.4   < > 4.1 6.6 3.9  NEUTROABS  --    < > 2  --  4  --  2  --   --   --   --   HGB 12.9   < > 12.4   < > 12.4   < > 11.9*   < > 11.2* 11.8*  12.3  HCT 42.0   < > 37   < > 37   < > 36   < > 35* 36 38  MCV 100.0  --   --   --   --   --   --   --   --   --   --   PLT 70*   < > 60*   < > 69*  --  63*  --   --   --  59*   < > = values in this interval not displayed.   Lab Results  Component Value Date   TSH 3.58 08/22/2018   Lab Results  Component Value Date   HGBA1C 5.3 09/07/2019   Lab Results  Component Value Date   CHOL 86 04/10/2020   HDL 34 (A) 04/10/2020   LDLCALC 45 04/10/2020   TRIG 36 (A)  04/10/2020   CHOLHDL 3 08/16/2017    Significant Diagnostic Results in last 30 days:  No results found.  Assessment/Plan 1. Essential hypertension B/p well controlled Continue on metoprolol 37.5 mg tablet twice daily ,verapamil 120 mg capsule daily and Imdur 15 mg tablet daily.   2. Chronic diastolic congestive heart failure (HCC) No signs of fluid overload.Not on diuretic. Continue metoprolol and Imdur as above.   3. Longstanding persistent atrial fibrillation (HCC) HR irregular. Continue on Warfarin and metoprolol.   4. History of seizures No recent seizure activity reported by staff.continue to monitor.   Family/ staff Communication: Reviewed plan of care with patient and facility Nurse   Labs/tests ordered: None

## 2020-04-11 DIAGNOSIS — R2681 Unsteadiness on feet: Secondary | ICD-10-CM | POA: Diagnosis not present

## 2020-04-11 DIAGNOSIS — I129 Hypertensive chronic kidney disease with stage 1 through stage 4 chronic kidney disease, or unspecified chronic kidney disease: Secondary | ICD-10-CM | POA: Diagnosis not present

## 2020-04-11 DIAGNOSIS — Z85038 Personal history of other malignant neoplasm of large intestine: Secondary | ICD-10-CM | POA: Diagnosis not present

## 2020-04-11 DIAGNOSIS — M6281 Muscle weakness (generalized): Secondary | ICD-10-CM | POA: Diagnosis not present

## 2020-04-11 DIAGNOSIS — I5032 Chronic diastolic (congestive) heart failure: Secondary | ICD-10-CM | POA: Diagnosis not present

## 2020-04-12 DIAGNOSIS — I129 Hypertensive chronic kidney disease with stage 1 through stage 4 chronic kidney disease, or unspecified chronic kidney disease: Secondary | ICD-10-CM | POA: Diagnosis not present

## 2020-04-12 DIAGNOSIS — R2681 Unsteadiness on feet: Secondary | ICD-10-CM | POA: Diagnosis not present

## 2020-04-12 DIAGNOSIS — Z85038 Personal history of other malignant neoplasm of large intestine: Secondary | ICD-10-CM | POA: Diagnosis not present

## 2020-04-12 DIAGNOSIS — M6281 Muscle weakness (generalized): Secondary | ICD-10-CM | POA: Diagnosis not present

## 2020-04-12 DIAGNOSIS — I5032 Chronic diastolic (congestive) heart failure: Secondary | ICD-10-CM | POA: Diagnosis not present

## 2020-04-13 DIAGNOSIS — M6281 Muscle weakness (generalized): Secondary | ICD-10-CM | POA: Diagnosis not present

## 2020-04-13 DIAGNOSIS — I129 Hypertensive chronic kidney disease with stage 1 through stage 4 chronic kidney disease, or unspecified chronic kidney disease: Secondary | ICD-10-CM | POA: Diagnosis not present

## 2020-04-13 DIAGNOSIS — R2681 Unsteadiness on feet: Secondary | ICD-10-CM | POA: Diagnosis not present

## 2020-04-13 DIAGNOSIS — Z85038 Personal history of other malignant neoplasm of large intestine: Secondary | ICD-10-CM | POA: Diagnosis not present

## 2020-04-13 DIAGNOSIS — I5032 Chronic diastolic (congestive) heart failure: Secondary | ICD-10-CM | POA: Diagnosis not present

## 2020-04-14 DIAGNOSIS — M6281 Muscle weakness (generalized): Secondary | ICD-10-CM | POA: Diagnosis not present

## 2020-04-14 DIAGNOSIS — R2681 Unsteadiness on feet: Secondary | ICD-10-CM | POA: Diagnosis not present

## 2020-04-14 DIAGNOSIS — I5032 Chronic diastolic (congestive) heart failure: Secondary | ICD-10-CM | POA: Diagnosis not present

## 2020-04-14 DIAGNOSIS — I129 Hypertensive chronic kidney disease with stage 1 through stage 4 chronic kidney disease, or unspecified chronic kidney disease: Secondary | ICD-10-CM | POA: Diagnosis not present

## 2020-04-14 DIAGNOSIS — Z85038 Personal history of other malignant neoplasm of large intestine: Secondary | ICD-10-CM | POA: Diagnosis not present

## 2020-04-15 DIAGNOSIS — I5032 Chronic diastolic (congestive) heart failure: Secondary | ICD-10-CM | POA: Diagnosis not present

## 2020-04-15 DIAGNOSIS — M6281 Muscle weakness (generalized): Secondary | ICD-10-CM | POA: Diagnosis not present

## 2020-04-15 DIAGNOSIS — Z85038 Personal history of other malignant neoplasm of large intestine: Secondary | ICD-10-CM | POA: Diagnosis not present

## 2020-04-15 DIAGNOSIS — R2681 Unsteadiness on feet: Secondary | ICD-10-CM | POA: Diagnosis not present

## 2020-04-15 DIAGNOSIS — I129 Hypertensive chronic kidney disease with stage 1 through stage 4 chronic kidney disease, or unspecified chronic kidney disease: Secondary | ICD-10-CM | POA: Diagnosis not present

## 2020-04-16 DIAGNOSIS — M6281 Muscle weakness (generalized): Secondary | ICD-10-CM | POA: Diagnosis not present

## 2020-04-16 DIAGNOSIS — Z85038 Personal history of other malignant neoplasm of large intestine: Secondary | ICD-10-CM | POA: Diagnosis not present

## 2020-04-16 DIAGNOSIS — R2681 Unsteadiness on feet: Secondary | ICD-10-CM | POA: Diagnosis not present

## 2020-04-16 DIAGNOSIS — I129 Hypertensive chronic kidney disease with stage 1 through stage 4 chronic kidney disease, or unspecified chronic kidney disease: Secondary | ICD-10-CM | POA: Diagnosis not present

## 2020-04-16 DIAGNOSIS — I5032 Chronic diastolic (congestive) heart failure: Secondary | ICD-10-CM | POA: Diagnosis not present

## 2020-04-17 DIAGNOSIS — I129 Hypertensive chronic kidney disease with stage 1 through stage 4 chronic kidney disease, or unspecified chronic kidney disease: Secondary | ICD-10-CM | POA: Diagnosis not present

## 2020-04-17 DIAGNOSIS — M6281 Muscle weakness (generalized): Secondary | ICD-10-CM | POA: Diagnosis not present

## 2020-04-17 DIAGNOSIS — I5032 Chronic diastolic (congestive) heart failure: Secondary | ICD-10-CM | POA: Diagnosis not present

## 2020-04-17 DIAGNOSIS — R2681 Unsteadiness on feet: Secondary | ICD-10-CM | POA: Diagnosis not present

## 2020-04-17 DIAGNOSIS — Z85038 Personal history of other malignant neoplasm of large intestine: Secondary | ICD-10-CM | POA: Diagnosis not present

## 2020-04-18 DIAGNOSIS — I129 Hypertensive chronic kidney disease with stage 1 through stage 4 chronic kidney disease, or unspecified chronic kidney disease: Secondary | ICD-10-CM | POA: Diagnosis not present

## 2020-04-18 DIAGNOSIS — R2681 Unsteadiness on feet: Secondary | ICD-10-CM | POA: Diagnosis not present

## 2020-04-18 DIAGNOSIS — M6281 Muscle weakness (generalized): Secondary | ICD-10-CM | POA: Diagnosis not present

## 2020-04-18 DIAGNOSIS — I5032 Chronic diastolic (congestive) heart failure: Secondary | ICD-10-CM | POA: Diagnosis not present

## 2020-04-18 DIAGNOSIS — Z85038 Personal history of other malignant neoplasm of large intestine: Secondary | ICD-10-CM | POA: Diagnosis not present

## 2020-04-19 DIAGNOSIS — Z85038 Personal history of other malignant neoplasm of large intestine: Secondary | ICD-10-CM | POA: Diagnosis not present

## 2020-04-19 DIAGNOSIS — R2681 Unsteadiness on feet: Secondary | ICD-10-CM | POA: Diagnosis not present

## 2020-04-19 DIAGNOSIS — I5032 Chronic diastolic (congestive) heart failure: Secondary | ICD-10-CM | POA: Diagnosis not present

## 2020-04-19 DIAGNOSIS — I129 Hypertensive chronic kidney disease with stage 1 through stage 4 chronic kidney disease, or unspecified chronic kidney disease: Secondary | ICD-10-CM | POA: Diagnosis not present

## 2020-04-19 DIAGNOSIS — M6281 Muscle weakness (generalized): Secondary | ICD-10-CM | POA: Diagnosis not present

## 2020-04-22 DIAGNOSIS — M6281 Muscle weakness (generalized): Secondary | ICD-10-CM | POA: Diagnosis not present

## 2020-04-22 DIAGNOSIS — I5032 Chronic diastolic (congestive) heart failure: Secondary | ICD-10-CM | POA: Diagnosis not present

## 2020-04-22 DIAGNOSIS — R2681 Unsteadiness on feet: Secondary | ICD-10-CM | POA: Diagnosis not present

## 2020-04-22 DIAGNOSIS — I129 Hypertensive chronic kidney disease with stage 1 through stage 4 chronic kidney disease, or unspecified chronic kidney disease: Secondary | ICD-10-CM | POA: Diagnosis not present

## 2020-04-22 DIAGNOSIS — Z85038 Personal history of other malignant neoplasm of large intestine: Secondary | ICD-10-CM | POA: Diagnosis not present

## 2020-04-23 DIAGNOSIS — Z23 Encounter for immunization: Secondary | ICD-10-CM | POA: Diagnosis not present

## 2020-05-06 ENCOUNTER — Telehealth: Payer: Self-pay | Admitting: Adult Health

## 2020-05-06 NOTE — Telephone Encounter (Signed)
Nurse from [facility] called to report that this resident has tested positive for Covid with a rapid test. There was apparently close contact and for screening purposes the test was performed. This resident currently has no symptoms. Staff will perform a f/u PCR test. Isolation will be ordered x 14 days. Provider at facility to follow up.

## 2020-05-07 NOTE — Telephone Encounter (Signed)
Noted thank  You

## 2020-05-13 ENCOUNTER — Encounter: Payer: Self-pay | Admitting: Orthopedic Surgery

## 2020-05-13 ENCOUNTER — Non-Acute Institutional Stay (SKILLED_NURSING_FACILITY): Payer: Medicare Other | Admitting: Orthopedic Surgery

## 2020-05-13 DIAGNOSIS — I10 Essential (primary) hypertension: Secondary | ICD-10-CM

## 2020-05-13 DIAGNOSIS — U071 COVID-19: Secondary | ICD-10-CM

## 2020-05-13 DIAGNOSIS — F0391 Unspecified dementia with behavioral disturbance: Secondary | ICD-10-CM | POA: Diagnosis not present

## 2020-05-13 DIAGNOSIS — M25561 Pain in right knee: Secondary | ICD-10-CM | POA: Diagnosis not present

## 2020-05-13 DIAGNOSIS — M25562 Pain in left knee: Secondary | ICD-10-CM | POA: Diagnosis not present

## 2020-05-13 DIAGNOSIS — I4811 Longstanding persistent atrial fibrillation: Secondary | ICD-10-CM | POA: Diagnosis not present

## 2020-05-13 DIAGNOSIS — G8929 Other chronic pain: Secondary | ICD-10-CM | POA: Diagnosis not present

## 2020-05-13 DIAGNOSIS — I5032 Chronic diastolic (congestive) heart failure: Secondary | ICD-10-CM

## 2020-05-13 DIAGNOSIS — R634 Abnormal weight loss: Secondary | ICD-10-CM | POA: Diagnosis not present

## 2020-05-13 DIAGNOSIS — F0392 Unspecified dementia, unspecified severity, with psychotic disturbance: Secondary | ICD-10-CM

## 2020-05-13 NOTE — Progress Notes (Signed)
Location:    [facility] Room Number: 740/C Place of Service:  SNF [phone]) Provider:  Windell Moulding NP  Gayland Curry, DO  Patient Care Team: Gayland Curry, DO as PCP - General (Geriatric Medicine) Ngetich, Nelda Bucks, NP as Nurse Practitioner (Family Medicine) Rehab, [facility] ([facility])  Extended Emergency Contact Information Primary Emergency Contact: [name],[name] Address: [address]          [place], [place] [address] [name] of [place] Phone: [phone] Mobile Phone: [phone] Relation: Daughter  Code Status:  DNR Goals of care: Advanced Directive information Advanced Directives 05/13/2020  Does Patient Have a Medical Advance Directive? Yes  Type of Advance Directive Out of facility DNR (pink MOST or yellow form)  Does patient want to make changes to medical advance directive? No - Patient declined  Copy of [place] in Chart? -  Would patient like information on creating a medical advance directive? -  Pre-existing out of facility DNR order (yellow form or pink MOST form) Yellow form placed in chart (order not valid for inpatient use)     Chief Complaint  Patient presents with  . Medical Management of Chronic Issues    Routine Visit of Medical Management  . Quality Metric Gaps    Dexa Scan  . Immunizations    Influenza Vaccine    HPI:  Pt is a 86 y.o. female seen today for medical management of chronic diseases.    She is a resident of [facility], seen today in her room.  Covid- tested positive for covid-19 05/06/2020. She has been placed on isolation precautions and plan is for f/u PCR in 14 days. At this time she is asymptomatic. Nursing staff and patient deny symptoms of cough, fever, sore throat, and loss of taste/smell.    Knee pain- she complains today of bilateral knee pain. States her knees feel stiff and sore at times. She has been  non-ambulatory requiring assistance with positioning and transfers to wheelchair/bed. Cannot recall the last time she was given tylenol for pain.   Weight- she has maintained her weight within the past month. She states she has been eating at least two meals daily. Ensure shakes were started 05/08/2020. Nursing staff states she will refuse them at times. Current BMI 23.98.   Dementia- she is still alert and orientated to self and familiar staff. Patient states she is lonely and misses her daughter. Cannot recall last time she spoke to daughter. Not interested in phoning daughter. Nursing staff denies any recent outbursts or hallucinations.   Blood pressure- she remains on a low sodium diet. No issues taking daily blood pressure medications.   No recent falls reported.  She is having regular bowel movements.       Past Medical History:  Diagnosis Date  . Acute encephalopathy   . Acute renal failure superimposed on stage 3 chronic kidney disease ([facility])   . Anemia   . Atrial fibrillation ([facility])   . Colon cancer ([facility]) 12/11  . Colon cancer ([facility]) 2011  . Diarrhea   . DVT (deep venous thrombosis) ([facility]) 11/06  . Elevated brain natriuretic peptide (BNP) level   . Frequent headaches   . Heart murmur   . Hyperkalemia   . Hypertension   . Pulmonary embolism ([facility]) 11/06  . Seizures ([place])   . Stroke ([facility]) 11/30/2014  . Urine retention 01/2019   Past Surgical History:  Procedure  Laterality Date  . APPENDECTOMY  1952  . COLECTOMY  06/2010   partial, Dr. Donne Hazel complicated by LLE CVT; S/P IVC umbrella & anemia  . HIP FRACTURE SURGERY  2006   Trauma  . TOTAL ABDOMINAL HYSTERECTOMY W/ BILATERAL SALPINGOOPHORECTOMY  1973   For Fibroids  . TOTAL HIP ARTHROPLASTY  01/03/07   Left hip replacement.  Marland Kitchen VENA CAVA FILTER PLACEMENT  06/28/2010    No Known Allergies  Allergies as of 05/13/2020   No Known Allergies     Medication List       Accurate as of May 13, 2020 11:01 AM. If you  have any questions, ask your nurse or doctor.        STOP taking these medications   nystatin cream Commonly known as: MYCOSTATIN Stopped by: Yvonna Alanis, NP     TAKE these medications   acetaminophen 500 MG tablet Commonly known as: TYLENOL Take 1,000 mg by mouth every 8 (eight) hours as needed for headache (pain).   albuterol (2.5 MG/3ML) 0.083% nebulizer solution Commonly known as: PROVENTIL Take 2.5 mg by nebulization every 6 (six) hours as needed for wheezing or shortness of breath.   albuterol 108 (90 Base) MCG/ACT inhaler Commonly known as: VENTOLIN HFA Inhale 2 puffs into the lungs every 6 (six) hours as needed for wheezing or shortness of breath.   Anusol-HC 2.5 % rectal cream Generic drug: hydrocortisone Place 1 application rectally 2 (two) times daily as needed for hemorrhoids or anal itching.   aspirin 81 MG chewable tablet Chew 81 mg by mouth daily.   atorvastatin 20 MG tablet Commonly known as: LIPITOR Take 1 tablet (20 mg total) by mouth daily.   Ensure Take 237 mLs by mouth in the morning, at noon, and at bedtime.   NUTRITIONAL SUPPLEMENT PO Take 1 each by mouth. Magic Cup with L/D meal   escitalopram 10 MG tablet Commonly known as: LEXAPRO Take 10 mg by mouth daily.   guaiFENesin 600 MG 12 hr tablet Commonly known as: MUCINEX Take 600 mg by mouth 2 (two) times daily.   ipratropium-albuterol 0.5-2.5 (3) MG/3ML Soln Commonly known as: DUONEB Take 3 mLs by nebulization every 6 (six) hours. For coughing, wheezing, and shortness of breath.   isosorbide mononitrate 30 MG 24 hr tablet Commonly known as: IMDUR Take 15 mg by mouth daily.   loratadine 10 MG tablet Commonly known as: CLARITIN Take 10 mg by mouth daily.   Metoprolol Tartrate 37.5 MG Tabs Take 1 tablet by mouth 2 (two) times daily.   mometasone-formoterol 100-5 MCG/ACT Aero Commonly known as: DULERA Inhale 2 puffs into the lungs 2 (two) times daily. Gargle and rinse your mouth with  water after each use of this medication to help prevent dryness,   multivitamin with minerals Tabs tablet Take 1 tablet by mouth daily.   NON FORMULARY Diet order: Regular/thin diet, Continue Liberalized diet per dietary   OYSTER SHELL/VITAMIN D PO Take 1 tablet by mouth daily. With breakfast   polyethylene glycol 17 g packet Commonly known as: MIRALAX / GLYCOLAX Take 17 g by mouth daily as needed.   promethazine 12.5 MG tablet Commonly known as: PHENERGAN Take 12.5 mg by mouth 3 (three) times daily as needed for nausea or vomiting.   QUEtiapine 50 MG tablet Commonly known as: SEROQUEL Take 50 mg by mouth at bedtime.   tamsulosin 0.4 MG Caps capsule Commonly known as: FLOMAX Take 1 capsule (0.4 mg total) by mouth daily.   verapamil  120 MG 24 hr capsule Commonly known as: VERELAN PM Take 120 mg by mouth at bedtime.   warfarin 2 MG tablet Commonly known as: COUMADIN Take as directed by the anticoagulation clinic. If you are unsure how to take this medication, talk to your nurse or doctor. Original instructions: Take 2 mg by mouth daily.       Review of Systems  Constitutional: Negative for activity change and appetite change.  HENT: Negative for congestion, sore throat and trouble swallowing.   Respiratory: Negative for cough and shortness of breath.   Cardiovascular: Negative for chest pain and leg swelling.  Gastrointestinal: Negative for abdominal pain and constipation.  Genitourinary: Positive for frequency.       Incontinence  Musculoskeletal: Positive for arthralgias. Negative for myalgias.       Bilateral knee pain  Skin: Negative for color change.  Neurological: Negative for seizures, weakness and headaches.  Psychiatric/Behavioral: Positive for confusion and dysphoric mood. Negative for agitation and hallucinations.    Immunization History  Administered Date(s) Administered  . Fluad Quad(high Dose 65+) 03/07/2019  . Influenza Split 03/30/2012,  03/07/2019  . Influenza Whole 04/30/2006, 04/20/2008, 04/15/2009  . Influenza, High Dose Seasonal PF 04/20/2013, 03/27/2015, 03/31/2016, 04/22/2017, 04/20/2018  . Influenza,inj,Quad PF,6+ Mos 05/23/2014  . Moderna SARS-COVID-2 Vaccination 07/06/2019, 08/03/2019  . Pneumococcal Conjugate-13 04/24/2015  . Pneumococcal Polysaccharide-23 07/06/2009  . Td 10/30/2016   Pertinent  Health Maintenance Due  Topic Date Due  . DEXA SCAN  Never done  . INFLUENZA VACCINE  02/04/2020  . PNA vac Low Risk Adult  Completed   Fall Risk  07/22/2018 01/18/2018 10/30/2016 08/14/2015 04/24/2015  Falls in the past year? 1 No No No No  Number falls in past yr: 1 - - - -  Injury with Fall? 0 - - - -   Functional Status Survey:    Vitals:   05/13/20 1052  BP: 133/76  Pulse: 75  Resp: 20  Temp: (!) 97.1 F (36.2 C)  SpO2: 96%  Weight: 135 lb 6.4 oz (61.4 kg)  Height: 5\' 3"  (1.6 m)   Body mass index is 23.99 kg/m. Physical Exam Vitals reviewed.  Constitutional:      Appearance: Normal appearance.  HENT:     Head: Normocephalic.     Nose: No congestion or rhinorrhea.     Mouth/Throat:     Mouth: Mucous membranes are moist.     Pharynx: No oropharyngeal exudate or posterior oropharyngeal erythema.  Eyes:     General:        Right eye: No discharge.        Left eye: No discharge.  Cardiovascular:     Rate and Rhythm: Rhythm irregular.     Pulses: Normal pulses.     Heart sounds: Normal heart sounds. No murmur heard.   Pulmonary:     Effort: Pulmonary effort is normal. No respiratory distress.     Breath sounds: Normal breath sounds.  Abdominal:     General: Abdomen is flat. Bowel sounds are normal.     Palpations: Abdomen is soft.  Musculoskeletal:     Right knee: Swelling present. No crepitus. Decreased range of motion. No tenderness.     Left knee: Swelling present. No crepitus. Decreased range of motion. No tenderness.     Right lower leg: No edema.     Left lower leg: No edema.      Comments: Mild swelling over left and right MCL  Skin:    General: Skin  is warm and dry.     Capillary Refill: Capillary refill takes less than 2 seconds.  Neurological:     General: No focal deficit present.     Mental Status: She is alert. She is disoriented.  Psychiatric:        Attention and Perception: Attention and perception normal.        Mood and Affect: Mood is depressed. Affect is flat.        Speech: Speech normal.        Cognition and Memory: Memory is impaired.     Labs reviewed: Recent Labs    11/09/19 0000 12/07/19 0000 03/22/20 0000  NA 147 145 146  K 4.5 4.4 3.9  CL 114* 108 109*  CO2 22 18 23*  BUN 27* 32* 30*  CREATININE 0.9 1.2* 1.2*  CALCIUM 8.7 9.9 8.7   Recent Labs    12/07/19 0000  AST 15  ALT 6*  ALBUMIN 4.3   Recent Labs    07/14/19 0000 07/14/19 0000 08/11/19 0000 08/11/19 0000 09/07/19 0000 09/07/19 0000 11/09/19 0000 12/07/19 0000 03/22/20 0000  WBC 3.6   < > 5.2   < > 3.4   < > 4.1 6.6 3.9  NEUTROABS 2  --  4  --  2  --   --   --   --   HGB 12.4   < > 12.4   < > 11.9*   < > 11.2* 11.8* 12.3  HCT 37   < > 37   < > 36   < > 35* 36 38  PLT 60*   < > 69*  --  63*  --   --   --  59*   < > = values in this interval not displayed.   Lab Results  Component Value Date   TSH 3.58 08/22/2018   Lab Results  Component Value Date   HGBA1C 5.3 09/07/2019   Lab Results  Component Value Date   CHOL 86 04/10/2020   HDL 34 (A) 04/10/2020   LDLCALC 45 04/10/2020   TRIG 36 (A) 04/10/2020   CHOLHDL 3 08/16/2017    Significant Diagnostic Results in last 30 days:  No results found.  Assessment/Plan 1. Essential hypertension - bp at goal <150/90 - continue current medication regimen - continue low sodium diet  2. Chronic diastolic congestive heart failure (HCC) - stable at this time with medication - no signs of fluid retention present on exam  - continue current medication regimen  3. Longstanding persistent atrial  fibrillation (HCC) - stable at this time with coumadin and metoprolol - INR scheduled to be rechecked 05/21/2020  4. Dementia with psychosis ([place]) - appears depressed with flat affect over not talking with daughter - suspect covid isolation and limited social interaction has upset patient - continue current Seroquel medication - recommend periods of sunlight during the day - no new reports of increased confusion or hallucinations  5. Weight loss - she has maintained her weight within the past month - continue ensure shakes TID between meals  6. COVID-19 - she remains asymptomatic at this time - recheck PCR in 2 weeks - recommend covid booster 90 days after testing positive  7. Chronic pain of both knees - mild swelling over MCL area bilaterally, no heat or tenderness on exam  - suspect advanced age and immobility as cause - continue tylenol 1000 mg TID PRN for pain    Family/ staff Communication: Plan discussed to patient and  facility nurse  Labs/tests ordered: none

## 2020-05-19 DIAGNOSIS — M6259 Muscle wasting and atrophy, not elsewhere classified, multiple sites: Secondary | ICD-10-CM | POA: Diagnosis not present

## 2020-05-19 DIAGNOSIS — R1312 Dysphagia, oropharyngeal phase: Secondary | ICD-10-CM | POA: Diagnosis not present

## 2020-05-19 DIAGNOSIS — R41841 Cognitive communication deficit: Secondary | ICD-10-CM | POA: Diagnosis not present

## 2020-05-19 DIAGNOSIS — M6281 Muscle weakness (generalized): Secondary | ICD-10-CM | POA: Diagnosis not present

## 2020-05-19 DIAGNOSIS — I5032 Chronic diastolic (congestive) heart failure: Secondary | ICD-10-CM | POA: Diagnosis not present

## 2020-05-19 DIAGNOSIS — R2689 Other abnormalities of gait and mobility: Secondary | ICD-10-CM | POA: Diagnosis not present

## 2020-05-19 DIAGNOSIS — R2681 Unsteadiness on feet: Secondary | ICD-10-CM | POA: Diagnosis not present

## 2020-05-19 DIAGNOSIS — U071 COVID-19: Secondary | ICD-10-CM | POA: Diagnosis not present

## 2020-05-20 DIAGNOSIS — M6281 Muscle weakness (generalized): Secondary | ICD-10-CM | POA: Diagnosis not present

## 2020-05-20 DIAGNOSIS — U071 COVID-19: Secondary | ICD-10-CM | POA: Diagnosis not present

## 2020-05-20 DIAGNOSIS — M6259 Muscle wasting and atrophy, not elsewhere classified, multiple sites: Secondary | ICD-10-CM | POA: Diagnosis not present

## 2020-05-20 DIAGNOSIS — I5032 Chronic diastolic (congestive) heart failure: Secondary | ICD-10-CM | POA: Diagnosis not present

## 2020-05-20 DIAGNOSIS — R2681 Unsteadiness on feet: Secondary | ICD-10-CM | POA: Diagnosis not present

## 2020-05-20 DIAGNOSIS — R2689 Other abnormalities of gait and mobility: Secondary | ICD-10-CM | POA: Diagnosis not present

## 2020-05-21 DIAGNOSIS — M6259 Muscle wasting and atrophy, not elsewhere classified, multiple sites: Secondary | ICD-10-CM | POA: Diagnosis not present

## 2020-05-21 DIAGNOSIS — M6281 Muscle weakness (generalized): Secondary | ICD-10-CM | POA: Diagnosis not present

## 2020-05-21 DIAGNOSIS — R2681 Unsteadiness on feet: Secondary | ICD-10-CM | POA: Diagnosis not present

## 2020-05-21 DIAGNOSIS — U071 COVID-19: Secondary | ICD-10-CM | POA: Diagnosis not present

## 2020-05-21 DIAGNOSIS — I5032 Chronic diastolic (congestive) heart failure: Secondary | ICD-10-CM | POA: Diagnosis not present

## 2020-05-21 DIAGNOSIS — R2689 Other abnormalities of gait and mobility: Secondary | ICD-10-CM | POA: Diagnosis not present

## 2020-05-22 DIAGNOSIS — I5032 Chronic diastolic (congestive) heart failure: Secondary | ICD-10-CM | POA: Diagnosis not present

## 2020-05-22 DIAGNOSIS — M6281 Muscle weakness (generalized): Secondary | ICD-10-CM | POA: Diagnosis not present

## 2020-05-22 DIAGNOSIS — U071 COVID-19: Secondary | ICD-10-CM | POA: Diagnosis not present

## 2020-05-22 DIAGNOSIS — R2689 Other abnormalities of gait and mobility: Secondary | ICD-10-CM | POA: Diagnosis not present

## 2020-05-22 DIAGNOSIS — M6259 Muscle wasting and atrophy, not elsewhere classified, multiple sites: Secondary | ICD-10-CM | POA: Diagnosis not present

## 2020-05-22 DIAGNOSIS — R2681 Unsteadiness on feet: Secondary | ICD-10-CM | POA: Diagnosis not present

## 2020-05-23 DIAGNOSIS — M6281 Muscle weakness (generalized): Secondary | ICD-10-CM | POA: Diagnosis not present

## 2020-05-23 DIAGNOSIS — I5032 Chronic diastolic (congestive) heart failure: Secondary | ICD-10-CM | POA: Diagnosis not present

## 2020-05-23 DIAGNOSIS — U071 COVID-19: Secondary | ICD-10-CM | POA: Diagnosis not present

## 2020-05-23 DIAGNOSIS — R2681 Unsteadiness on feet: Secondary | ICD-10-CM | POA: Diagnosis not present

## 2020-05-23 DIAGNOSIS — M6259 Muscle wasting and atrophy, not elsewhere classified, multiple sites: Secondary | ICD-10-CM | POA: Diagnosis not present

## 2020-05-23 DIAGNOSIS — R2689 Other abnormalities of gait and mobility: Secondary | ICD-10-CM | POA: Diagnosis not present

## 2020-05-24 DIAGNOSIS — R2681 Unsteadiness on feet: Secondary | ICD-10-CM | POA: Diagnosis not present

## 2020-05-24 DIAGNOSIS — U071 COVID-19: Secondary | ICD-10-CM | POA: Diagnosis not present

## 2020-05-24 DIAGNOSIS — M6281 Muscle weakness (generalized): Secondary | ICD-10-CM | POA: Diagnosis not present

## 2020-05-24 DIAGNOSIS — M6259 Muscle wasting and atrophy, not elsewhere classified, multiple sites: Secondary | ICD-10-CM | POA: Diagnosis not present

## 2020-05-24 DIAGNOSIS — R2689 Other abnormalities of gait and mobility: Secondary | ICD-10-CM | POA: Diagnosis not present

## 2020-05-24 DIAGNOSIS — I5032 Chronic diastolic (congestive) heart failure: Secondary | ICD-10-CM | POA: Diagnosis not present

## 2020-05-25 DIAGNOSIS — M6259 Muscle wasting and atrophy, not elsewhere classified, multiple sites: Secondary | ICD-10-CM | POA: Diagnosis not present

## 2020-05-25 DIAGNOSIS — R2681 Unsteadiness on feet: Secondary | ICD-10-CM | POA: Diagnosis not present

## 2020-05-25 DIAGNOSIS — I5032 Chronic diastolic (congestive) heart failure: Secondary | ICD-10-CM | POA: Diagnosis not present

## 2020-05-25 DIAGNOSIS — R2689 Other abnormalities of gait and mobility: Secondary | ICD-10-CM | POA: Diagnosis not present

## 2020-05-25 DIAGNOSIS — M6281 Muscle weakness (generalized): Secondary | ICD-10-CM | POA: Diagnosis not present

## 2020-05-25 DIAGNOSIS — U071 COVID-19: Secondary | ICD-10-CM | POA: Diagnosis not present

## 2020-05-27 ENCOUNTER — Observation Stay (HOSPITAL_COMMUNITY): Payer: Medicare Other

## 2020-05-27 ENCOUNTER — Other Ambulatory Visit: Payer: Self-pay

## 2020-05-27 ENCOUNTER — Inpatient Hospital Stay (HOSPITAL_COMMUNITY)
Admission: EM | Admit: 2020-05-27 | Discharge: 2020-05-29 | DRG: 069 | Disposition: A | Discharge status: Skilled Nursing Facility | Payer: Medicare Other | Source: Skilled Nursing Facility | Attending: Family Medicine | Admitting: Family Medicine

## 2020-05-27 ENCOUNTER — Observation Stay (HOSPITAL_BASED_OUTPATIENT_CLINIC_OR_DEPARTMENT_OTHER): Payer: Medicare Other

## 2020-05-27 ENCOUNTER — Emergency Department (HOSPITAL_COMMUNITY): Payer: Medicare Other

## 2020-05-27 DIAGNOSIS — M419 Scoliosis, unspecified: Secondary | ICD-10-CM | POA: Diagnosis present

## 2020-05-27 DIAGNOSIS — Z96642 Presence of left artificial hip joint: Secondary | ICD-10-CM | POA: Diagnosis present

## 2020-05-27 DIAGNOSIS — R109 Unspecified abdominal pain: Secondary | ICD-10-CM | POA: Diagnosis not present

## 2020-05-27 DIAGNOSIS — G319 Degenerative disease of nervous system, unspecified: Secondary | ICD-10-CM | POA: Diagnosis not present

## 2020-05-27 DIAGNOSIS — R471 Dysarthria and anarthria: Secondary | ICD-10-CM | POA: Diagnosis present

## 2020-05-27 DIAGNOSIS — I959 Hypotension, unspecified: Secondary | ICD-10-CM | POA: Diagnosis not present

## 2020-05-27 DIAGNOSIS — G459 Transient cerebral ischemic attack, unspecified: Secondary | ICD-10-CM | POA: Diagnosis not present

## 2020-05-27 DIAGNOSIS — D696 Thrombocytopenia, unspecified: Secondary | ICD-10-CM | POA: Diagnosis present

## 2020-05-27 DIAGNOSIS — R531 Weakness: Secondary | ICD-10-CM | POA: Diagnosis not present

## 2020-05-27 DIAGNOSIS — I361 Nonrheumatic tricuspid (valve) insufficiency: Secondary | ICD-10-CM

## 2020-05-27 DIAGNOSIS — M6259 Muscle wasting and atrophy, not elsewhere classified, multiple sites: Secondary | ICD-10-CM | POA: Diagnosis not present

## 2020-05-27 DIAGNOSIS — I444 Left anterior fascicular block: Secondary | ICD-10-CM | POA: Diagnosis present

## 2020-05-27 DIAGNOSIS — Z20822 Contact with and (suspected) exposure to covid-19: Secondary | ICD-10-CM | POA: Diagnosis present

## 2020-05-27 DIAGNOSIS — I2510 Atherosclerotic heart disease of native coronary artery without angina pectoris: Secondary | ICD-10-CM | POA: Diagnosis present

## 2020-05-27 DIAGNOSIS — N1832 Chronic kidney disease, stage 3b: Secondary | ICD-10-CM | POA: Diagnosis present

## 2020-05-27 DIAGNOSIS — Z833 Family history of diabetes mellitus: Secondary | ICD-10-CM

## 2020-05-27 DIAGNOSIS — F0390 Unspecified dementia without behavioral disturbance: Secondary | ICD-10-CM | POA: Diagnosis present

## 2020-05-27 DIAGNOSIS — R29810 Facial weakness: Secondary | ICD-10-CM | POA: Diagnosis present

## 2020-05-27 DIAGNOSIS — U071 COVID-19: Secondary | ICD-10-CM | POA: Diagnosis not present

## 2020-05-27 DIAGNOSIS — R29818 Other symptoms and signs involving the nervous system: Secondary | ICD-10-CM | POA: Diagnosis not present

## 2020-05-27 DIAGNOSIS — K5900 Constipation, unspecified: Secondary | ICD-10-CM | POA: Diagnosis not present

## 2020-05-27 DIAGNOSIS — E162 Hypoglycemia, unspecified: Secondary | ICD-10-CM

## 2020-05-27 DIAGNOSIS — Z66 Do not resuscitate: Secondary | ICD-10-CM | POA: Diagnosis present

## 2020-05-27 DIAGNOSIS — Z95828 Presence of other vascular implants and grafts: Secondary | ICD-10-CM

## 2020-05-27 DIAGNOSIS — R2681 Unsteadiness on feet: Secondary | ICD-10-CM | POA: Diagnosis not present

## 2020-05-27 DIAGNOSIS — Z7722 Contact with and (suspected) exposure to environmental tobacco smoke (acute) (chronic): Secondary | ICD-10-CM | POA: Diagnosis present

## 2020-05-27 DIAGNOSIS — I10 Essential (primary) hypertension: Secondary | ICD-10-CM

## 2020-05-27 DIAGNOSIS — Z9049 Acquired absence of other specified parts of digestive tract: Secondary | ICD-10-CM

## 2020-05-27 DIAGNOSIS — R2689 Other abnormalities of gait and mobility: Secondary | ICD-10-CM | POA: Diagnosis not present

## 2020-05-27 DIAGNOSIS — R0902 Hypoxemia: Secondary | ICD-10-CM | POA: Diagnosis not present

## 2020-05-27 DIAGNOSIS — I340 Nonrheumatic mitral (valve) insufficiency: Secondary | ICD-10-CM

## 2020-05-27 DIAGNOSIS — I639 Cerebral infarction, unspecified: Secondary | ICD-10-CM | POA: Diagnosis present

## 2020-05-27 DIAGNOSIS — Z7901 Long term (current) use of anticoagulants: Secondary | ICD-10-CM | POA: Diagnosis not present

## 2020-05-27 DIAGNOSIS — Z79899 Other long term (current) drug therapy: Secondary | ICD-10-CM

## 2020-05-27 DIAGNOSIS — Z90722 Acquired absence of ovaries, bilateral: Secondary | ICD-10-CM

## 2020-05-27 DIAGNOSIS — I6782 Cerebral ischemia: Secondary | ICD-10-CM | POA: Diagnosis not present

## 2020-05-27 DIAGNOSIS — Z7982 Long term (current) use of aspirin: Secondary | ICD-10-CM

## 2020-05-27 DIAGNOSIS — G8194 Hemiplegia, unspecified affecting left nondominant side: Secondary | ICD-10-CM | POA: Diagnosis present

## 2020-05-27 DIAGNOSIS — F32A Depression, unspecified: Secondary | ICD-10-CM | POA: Diagnosis present

## 2020-05-27 DIAGNOSIS — I6389 Other cerebral infarction: Secondary | ICD-10-CM

## 2020-05-27 DIAGNOSIS — I083 Combined rheumatic disorders of mitral, aortic and tricuspid valves: Secondary | ICD-10-CM | POA: Diagnosis present

## 2020-05-27 DIAGNOSIS — Z85038 Personal history of other malignant neoplasm of large intestine: Secondary | ICD-10-CM

## 2020-05-27 DIAGNOSIS — J449 Chronic obstructive pulmonary disease, unspecified: Secondary | ICD-10-CM | POA: Diagnosis present

## 2020-05-27 DIAGNOSIS — R8271 Bacteriuria: Secondary | ICD-10-CM | POA: Diagnosis present

## 2020-05-27 DIAGNOSIS — I4819 Other persistent atrial fibrillation: Secondary | ICD-10-CM | POA: Diagnosis not present

## 2020-05-27 DIAGNOSIS — Z8673 Personal history of transient ischemic attack (TIA), and cerebral infarction without residual deficits: Secondary | ICD-10-CM | POA: Diagnosis not present

## 2020-05-27 DIAGNOSIS — E785 Hyperlipidemia, unspecified: Secondary | ICD-10-CM | POA: Diagnosis present

## 2020-05-27 DIAGNOSIS — Z86718 Personal history of other venous thrombosis and embolism: Secondary | ICD-10-CM

## 2020-05-27 DIAGNOSIS — M6281 Muscle weakness (generalized): Secondary | ICD-10-CM | POA: Diagnosis not present

## 2020-05-27 DIAGNOSIS — I4891 Unspecified atrial fibrillation: Secondary | ICD-10-CM | POA: Diagnosis not present

## 2020-05-27 DIAGNOSIS — Z8249 Family history of ischemic heart disease and other diseases of the circulatory system: Secondary | ICD-10-CM

## 2020-05-27 DIAGNOSIS — R001 Bradycardia, unspecified: Secondary | ICD-10-CM | POA: Diagnosis not present

## 2020-05-27 DIAGNOSIS — Z86711 Personal history of pulmonary embolism: Secondary | ICD-10-CM

## 2020-05-27 DIAGNOSIS — Z90710 Acquired absence of both cervix and uterus: Secondary | ICD-10-CM

## 2020-05-27 DIAGNOSIS — I5032 Chronic diastolic (congestive) heart failure: Secondary | ICD-10-CM | POA: Diagnosis not present

## 2020-05-27 DIAGNOSIS — Z8616 Personal history of COVID-19: Secondary | ICD-10-CM

## 2020-05-27 DIAGNOSIS — I129 Hypertensive chronic kidney disease with stage 1 through stage 4 chronic kidney disease, or unspecified chronic kidney disease: Secondary | ICD-10-CM | POA: Diagnosis present

## 2020-05-27 LAB — URINALYSIS, ROUTINE W REFLEX MICROSCOPIC
Bilirubin Urine: NEGATIVE
Glucose, UA: NEGATIVE mg/dL
Hgb urine dipstick: NEGATIVE
Ketones, ur: NEGATIVE mg/dL
Nitrite: NEGATIVE
Protein, ur: 30 mg/dL — AB
Specific Gravity, Urine: 1.016 (ref 1.005–1.030)
WBC, UA: >50 WBC/hpf — ABNORMAL HIGH (ref 0–5)
pH: 7 (ref 5.0–8.0)

## 2020-05-27 LAB — ECHOCARDIOGRAM COMPLETE
AR max vel: 1.22 cm2
AV Area VTI: 1.34 cm2
AV Area mean vel: 1.25 cm2
AV Mean grad: 5 mmHg
AV Peak grad: 10 mmHg
Ao pk vel: 1.58 m/s
S' Lateral: 3 cm
Weight: 2285.73 oz

## 2020-05-27 LAB — PROTIME-INR
INR: 1.9 — ABNORMAL HIGH (ref 0.8–1.2)
Prothrombin Time: 20.9 seconds — ABNORMAL HIGH (ref 11.4–15.2)

## 2020-05-27 LAB — COMPREHENSIVE METABOLIC PANEL
ALT: 10 U/L (ref 0–44)
AST: 21 U/L (ref 15–41)
Albumin: 2.6 g/dL — ABNORMAL LOW (ref 3.5–5.0)
Alkaline Phosphatase: 44 U/L (ref 38–126)
Anion gap: 8 (ref 5–15)
BUN: 28 mg/dL — ABNORMAL HIGH (ref 8–23)
CO2: 19 mmol/L — ABNORMAL LOW (ref 22–32)
Calcium: 7.2 mg/dL — ABNORMAL LOW (ref 8.9–10.3)
Chloride: 117 mmol/L — ABNORMAL HIGH (ref 98–111)
Creatinine, Ser: 0.91 mg/dL (ref 0.44–1.00)
GFR, Estimated: 60 mL/min — ABNORMAL LOW (ref >60)
Glucose, Bld: 71 mg/dL (ref 70–99)
Potassium: 4 mmol/L (ref 3.5–5.1)
Sodium: 144 mmol/L (ref 135–145)
Total Bilirubin: 0.8 mg/dL (ref 0.3–1.2)
Total Protein: 4.8 g/dL — ABNORMAL LOW (ref 6.5–8.1)

## 2020-05-27 LAB — CBC WITH DIFFERENTIAL/PLATELET
Abs Immature Granulocytes: 0.01 10*3/uL (ref 0.00–0.07)
Basophils Absolute: 0 10*3/uL (ref 0.0–0.1)
Basophils Relative: 1 %
Eosinophils Absolute: 0.1 10*3/uL (ref 0.0–0.5)
Eosinophils Relative: 2 %
HCT: 42.3 % (ref 36.0–46.0)
Hemoglobin: 12.9 g/dL (ref 12.0–15.0)
Immature Granulocytes: 0 %
Lymphocytes Relative: 19 %
Lymphs Abs: 1.1 10*3/uL (ref 0.7–4.0)
MCH: 29.5 pg (ref 26.0–34.0)
MCHC: 30.5 g/dL (ref 30.0–36.0)
MCV: 96.6 fL (ref 80.0–100.0)
Monocytes Absolute: 0.5 10*3/uL (ref 0.1–1.0)
Monocytes Relative: 8 %
Neutro Abs: 4.1 10*3/uL (ref 1.7–7.7)
Neutrophils Relative %: 70 %
Platelets: 94 10*3/uL — ABNORMAL LOW (ref 150–400)
RBC: 4.38 MIL/uL (ref 3.87–5.11)
RDW: 16.1 % — ABNORMAL HIGH (ref 11.5–15.5)
WBC: 5.8 10*3/uL (ref 4.0–10.5)
nRBC: 0 % (ref 0.0–0.2)

## 2020-05-27 LAB — I-STAT CHEM 8, ED
BUN: 34 mg/dL — ABNORMAL HIGH (ref 8–23)
Calcium, Ion: 1.01 mmol/L — ABNORMAL LOW (ref 1.15–1.40)
Chloride: 115 mmol/L — ABNORMAL HIGH (ref 98–111)
Creatinine, Ser: 0.8 mg/dL (ref 0.44–1.00)
Glucose, Bld: 69 mg/dL — ABNORMAL LOW (ref 70–99)
HCT: 33 % — ABNORMAL LOW (ref 36.0–46.0)
Hemoglobin: 11.2 g/dL — ABNORMAL LOW (ref 12.0–15.0)
Potassium: 3.9 mmol/L (ref 3.5–5.1)
Sodium: 147 mmol/L — ABNORMAL HIGH (ref 135–145)
TCO2: 20 mmol/L — ABNORMAL LOW (ref 22–32)

## 2020-05-27 LAB — RAPID URINE DRUG SCREEN, HOSP PERFORMED
Amphetamines: NOT DETECTED
Barbiturates: NOT DETECTED
Benzodiazepines: NOT DETECTED
Cocaine: NOT DETECTED
Opiates: NOT DETECTED
Tetrahydrocannabinol: NOT DETECTED

## 2020-05-27 LAB — RESPIRATORY PANEL BY RT PCR (FLU A&B, COVID)
Influenza A by PCR: NEGATIVE
Influenza B by PCR: NEGATIVE
SARS Coronavirus 2 by RT PCR: NEGATIVE

## 2020-05-27 LAB — LIPID PANEL
Cholesterol: 95 mg/dL (ref 0–200)
HDL: 32 mg/dL — ABNORMAL LOW (ref >40)
LDL Cholesterol: 55 mg/dL (ref 0–99)
Total CHOL/HDL Ratio: 3 RATIO
Triglycerides: 39 mg/dL (ref <150)
VLDL: 8 mg/dL (ref 0–40)

## 2020-05-27 LAB — CBG MONITORING, ED: Glucose-Capillary: 82 mg/dL (ref 70–99)

## 2020-05-27 LAB — HEMOGLOBIN A1C
Hgb A1c MFr Bld: 5.6 % (ref 4.8–5.6)
Mean Plasma Glucose: 114.02 mg/dL

## 2020-05-27 LAB — APTT: aPTT: 34 seconds (ref 24–36)

## 2020-05-27 LAB — ETHANOL: Alcohol, Ethyl (B): <10 mg/dL (ref <10)

## 2020-05-27 MED ORDER — STROKE: EARLY STAGES OF RECOVERY BOOK
Freq: Once | Status: AC
  Filled 2020-05-27: qty 1

## 2020-05-27 MED ORDER — ACETAMINOPHEN 160 MG/5ML PO SOLN: 650.0000 mg | ORAL | Status: DC | PRN

## 2020-05-27 MED ORDER — QUETIAPINE FUMARATE 50 MG PO TABS
50.0000 mg | ORAL_TABLET | Freq: Every day | ORAL | Status: DC
  Administered 2020-05-27 – 2020-05-28 (×2): 50 mg via ORAL
  Filled 2020-05-27 (×4): qty 1

## 2020-05-27 MED ORDER — LORAZEPAM 2 MG/ML IJ SOLN
0.5000 mg | Freq: Once | INTRAMUSCULAR | Status: AC | PRN
  Administered 2020-05-27: 0.5 mg via INTRAVENOUS
  Filled 2020-05-27: qty 1

## 2020-05-27 MED ORDER — IPRATROPIUM-ALBUTEROL 0.5-2.5 (3) MG/3ML IN SOLN
3.0000 mL | Freq: Four times a day (QID) | RESPIRATORY_TRACT | Status: DC
  Filled 2020-05-27 (×3): qty 3

## 2020-05-27 MED ORDER — SODIUM CHLORIDE 0.9 % IV SOLN: INTRAVENOUS | Status: DC

## 2020-05-27 MED ORDER — MOMETASONE FURO-FORMOTEROL FUM 100-5 MCG/ACT IN AERO
2.0000 | INHALATION_SPRAY | Freq: Two times a day (BID) | RESPIRATORY_TRACT | Status: DC
  Administered 2020-05-28 – 2020-05-29 (×3): 2 via RESPIRATORY_TRACT
  Filled 2020-05-27: qty 8.8

## 2020-05-27 MED ORDER — ACETAMINOPHEN 325 MG PO TABS: 650.0000 mg | ORAL_TABLET | ORAL | Status: DC | PRN

## 2020-05-27 MED ORDER — GUAIFENESIN ER 600 MG PO TB12
600.0000 mg | ORAL_TABLET | Freq: Two times a day (BID) | ORAL | Status: DC
  Administered 2020-05-27 – 2020-05-29 (×4): 600 mg via ORAL
  Filled 2020-05-27 (×5): qty 1

## 2020-05-27 MED ORDER — ENSURE ENLIVE PO LIQD
237.0000 mL | Freq: Two times a day (BID) | ORAL | Status: DC
  Administered 2020-05-27 – 2020-05-29 (×4): 237 mL via ORAL
  Filled 2020-05-27: qty 237

## 2020-05-27 MED ORDER — LORATADINE 10 MG PO TABS
10.0000 mg | ORAL_TABLET | Freq: Every day | ORAL | Status: DC
  Administered 2020-05-27 – 2020-05-29 (×3): 10 mg via ORAL
  Filled 2020-05-27 (×3): qty 1

## 2020-05-27 MED ORDER — ACETAMINOPHEN 650 MG RE SUPP: 650.0000 mg | RECTAL | Status: DC | PRN

## 2020-05-27 MED ORDER — ATORVASTATIN CALCIUM 10 MG PO TABS
20.0000 mg | ORAL_TABLET | Freq: Every day | ORAL | Status: DC
  Administered 2020-05-27 – 2020-05-29 (×3): 20 mg via ORAL
  Filled 2020-05-27 (×3): qty 2

## 2020-05-27 MED ORDER — CALCIUM CARBONATE-VITAMIN D 500-200 MG-UNIT PO TABS
1.0000 | ORAL_TABLET | Freq: Every day | ORAL | Status: DC
  Administered 2020-05-28 – 2020-05-29 (×2): 1 via ORAL
  Filled 2020-05-27 (×4): qty 1

## 2020-05-27 MED ORDER — ESCITALOPRAM OXALATE 10 MG PO TABS
10.0000 mg | ORAL_TABLET | Freq: Every day | ORAL | Status: DC
  Administered 2020-05-27 – 2020-05-29 (×3): 10 mg via ORAL
  Filled 2020-05-27 (×3): qty 1

## 2020-05-27 MED ORDER — ASPIRIN 81 MG PO CHEW
81.0000 mg | CHEWABLE_TABLET | Freq: Every day | ORAL | Status: DC
  Administered 2020-05-27 – 2020-05-29 (×3): 81 mg via ORAL
  Filled 2020-05-27 (×3): qty 1

## 2020-05-27 MED ORDER — SODIUM CHLORIDE 0.9 % IV SOLN
1.0000 g | INTRAVENOUS | Status: DC
  Filled 2020-05-27 (×2): qty 10

## 2020-05-27 MED ORDER — PROMETHAZINE HCL 25 MG PO TABS: 12.5000 mg | ORAL_TABLET | Freq: Three times a day (TID) | ORAL | Status: DC | PRN

## 2020-05-27 MED ORDER — TAMSULOSIN HCL 0.4 MG PO CAPS
0.4000 mg | ORAL_CAPSULE | Freq: Every day | ORAL | Status: DC
  Administered 2020-05-27 – 2020-05-29 (×3): 0.4 mg via ORAL
  Filled 2020-05-27 (×3): qty 1

## 2020-05-27 MED ORDER — HYDROCORTISONE (PERIANAL) 2.5 % EX CREA
1.0000 "application " | TOPICAL_CREAM | Freq: Two times a day (BID) | CUTANEOUS | Status: DC | PRN
  Filled 2020-05-27: qty 28.35

## 2020-05-27 MED ORDER — BOOST / RESOURCE BREEZE PO LIQD CUSTOM
237.0000 mL | Freq: Three times a day (TID) | ORAL | Status: DC
  Administered 2020-05-27 – 2020-05-29 (×8): 1 via ORAL
  Filled 2020-05-27: qty 1

## 2020-05-27 MED ORDER — POLYETHYLENE GLYCOL 3350 17 G PO PACK: 17.0000 g | PACK | Freq: Every day | ORAL | Status: DC | PRN

## 2020-05-27 MED ORDER — WARFARIN SODIUM 2.5 MG PO TABS
2.5000 mg | ORAL_TABLET | Freq: Once | ORAL | Status: AC
  Administered 2020-05-27: 2.5 mg via ORAL
  Filled 2020-05-27: qty 1

## 2020-05-27 MED ORDER — DEXTROSE 50 % IV SOLN: 1.0000 | Freq: Once | INTRAVENOUS | Status: DC

## 2020-05-27 MED ORDER — ACETAMINOPHEN 500 MG PO TABS: 1000.0000 mg | ORAL_TABLET | Freq: Three times a day (TID) | ORAL | Status: DC | PRN

## 2020-05-27 MED ORDER — SENNOSIDES-DOCUSATE SODIUM 8.6-50 MG PO TABS
1.0000 | ORAL_TABLET | Freq: Two times a day (BID) | ORAL | Status: DC
  Administered 2020-05-27 – 2020-05-29 (×4): 1 via ORAL
  Filled 2020-05-27 (×5): qty 1

## 2020-05-27 MED ORDER — WARFARIN - PHARMACIST DOSING INPATIENT: Freq: Every day | Status: DC

## 2020-05-27 MED ORDER — ADULT MULTIVITAMIN W/MINERALS CH
1.0000 | ORAL_TABLET | Freq: Every day | ORAL | Status: DC
  Administered 2020-05-27 – 2020-05-29 (×3): 1 via ORAL
  Filled 2020-05-27 (×3): qty 1

## 2020-05-27 NOTE — ED Provider Notes (Signed)
[facility] EMERGENCY DEPARTMENT Provider Note   CSN: [id number] Arrival date & time: 05/27/20  4128  An emergency department physician performed an initial assessment on this suspected stroke patient at [phone].  History Code Stroke  [name] is a 86 y.o. female with past medical history significant for PE, seizure, CVA, A. Fib, CAD, CHF, Dementia who presents for evaluation under code stroke.    Patient last known normal at 0700 at facility with nursing rounding at facility at baseline.  At [name] patient was seen by PT noted left-sided facial droop and left-sided weakness.  Symptoms resolved on arrival to ED.  Patient denies any complaints. No HA, lightness, dizziness, cough, chest pain, shortness breath abdominal pain, numbness  Resident [name]   Attempted to contact collateral from [name], daughter, emergency contact at [phone] and [phone] however no answer  1040: Patient daughter in room. [name] Was called by nursing facility.  Symptoms seem consistent with her prior stroke where she was admitted at [facility]  Level 5 Caveat- Dementia  HPI     Past Medical History:  Diagnosis Date  . Acute encephalopathy   . Acute renal failure superimposed on stage 3 chronic kidney disease ([place])   . Anemia   . Atrial fibrillation ([place])   . Colon cancer ([place]) 12/11  . Colon cancer ([place]) 2011  . Diarrhea   . DVT (deep venous thrombosis) ([place]) 11/06  . Elevated brain natriuretic peptide (BNP) level   . Frequent headaches   . Heart murmur   . Hyperkalemia   . Hypertension   . Pulmonary embolism ([place]) 11/06  . Seizures ([place])   . Stroke ([place]) 11/30/2014  . Urine retention 01/2019    Patient Active Problem List   Diagnosis Date Noted  . Left-sided weakness 05/27/2020  . MCI (mild cognitive impairment) 03/07/2019  . Congestive heart failure (CHF) ([place]) 01/20/2019  . CAD (coronary artery disease) 06/07/2018  .  Pulmonary hypertension, primary ([place]) 06/07/2018  . Generalized weakness   . Abnormal LFTs 06/01/2018  . Hallucination 06/01/2018  . Edema of right lower extremity 09/28/2015  . Asthma 08/14/2015  . PCP NOTES >>>>>>>>>>>>>>>>>>>>>>>>>>>>>> 04/24/2015  . TIA (transient ischemic attack) 12/11/2014  . Dyslipidemia 12/11/2014  . History of CVA (cerebrovascular accident) 12/01/2014  . Thrombocytopenia ([place]) 12/01/2014  . Chronic renal insufficiency, stage II (mild) 11/12/2013  . Impingement syndrome, shoulder, left 11/09/2013  . Acquired short bowel syndrome 11/09/2013  . Hx pulmonary embolism 06/27/2013  . Warfarin anticoagulation 06/27/2013  . DDD (degenerative disc disease), lumbar 11/18/2012  . ATRIAL FIBRILLATION   07/08/2010  . COLON CANCER, HX OF 07/08/2010  . DIZZINESS 10/18/2009  . Essential hypertension 12/16/2007  . HIP PAIN, CHRONIC 12/28/2006  . ANEMIA-NOS 07/15/2006  . Personal history of venous thrombosis and embolism 07/15/2006    Past Surgical History:  Procedure Laterality Date  . APPENDECTOMY  1952  . COLECTOMY  06/2010   partial, Dr. Donne Hazel complicated by LLE CVT; S/P IVC umbrella & anemia  . HIP FRACTURE SURGERY  2006   Trauma  . TOTAL ABDOMINAL HYSTERECTOMY W/ BILATERAL SALPINGOOPHORECTOMY  1973   For Fibroids  . TOTAL HIP ARTHROPLASTY  01/03/07   Left hip replacement.  Marland Kitchen VENA CAVA FILTER PLACEMENT  06/28/2010     OB History   No obstetric history on file.     Family History  Problem Relation Age of Onset  . Peripheral vascular disease Father   . Heart attack Mother  29  . Heart disease Mother        MI  . Coronary artery disease Sister   . Diabetes Paternal Grandmother   . Coronary artery disease Maternal Grandfather     Social History   Tobacco Use  . Smoking status: Passive Smoke Exposure - Never Smoker  . Smokeless tobacco: Never Used  . Tobacco comment: husband smoked in home.   Vaping Use  . Vaping Use: Never used  Substance Use  Topics  . Alcohol use: No    Alcohol/week: 0.0 standard drinks  . Drug use: No    Home Medications Prior to Admission medications   Medication Sig Start Date End Date Taking? Authorizing Provider  acetaminophen (TYLENOL) 500 MG tablet Take 1,000 mg by mouth every 8 (eight) hours as needed for headache (pain).    Yes [provider]  albuterol (ACCUNEB) 1.25 MG/3ML nebulizer solution Take 2.5 mg by nebulization every 6 (six) hours as needed for wheezing or shortness of breath.    Yes [provider]  albuterol (VENTOLIN HFA) 108 (90 Base) MCG/ACT inhaler Inhale 2 puffs into the lungs every 6 (six) hours as needed for wheezing or shortness of breath.   Yes [provider]  aspirin 81 MG chewable tablet Chew 81 mg by mouth daily.   Yes [provider]  atorvastatin (LIPITOR) 20 MG tablet Take 1 tablet (20 mg total) by mouth daily. 07/04/19  Yes Paz, [name], MD  Calcium Carbonate-Vitamin D (OYSTER SHELL/VITAMIN D PO) Take 1 tablet by mouth daily. With breakfast   Yes [provider]  Ensure (ENSURE) Take 237 mLs by mouth in the morning, at noon, and at bedtime.   Yes [provider]  escitalopram (LEXAPRO) 10 MG tablet Take 10 mg by mouth daily.   Yes [provider]  guaiFENesin (MUCINEX) 600 MG 12 hr tablet Take 600 mg by mouth 2 (two) times daily.  03/21/20  Yes [provider]  hydrocortisone (ANUSOL-HC) 2.5 % rectal cream Place 1 application rectally 2 (two) times daily as needed for hemorrhoids or anal itching.   Yes [provider]  ipratropium-albuterol (DUONEB) 0.5-2.5 (3) MG/3ML SOLN Take 3 mLs by nebulization every 6 (six) hours. For coughing, wheezing, and shortness of breath.   Yes [provider]  isosorbide mononitrate (IMDUR) 30 MG 24 hr tablet Take 15 mg by mouth daily.   Yes [provider]  loratadine (CLARITIN) 10 MG tablet Take 10 mg by mouth daily.   Yes [provider]   Metoprolol Tartrate 37.5 MG TABS Take 1 tablet by mouth 2 (two) times daily.   Yes [provider]  mometasone-formoterol (DULERA) 100-5 MCG/ACT AERO Inhale 2 puffs into the lungs 2 (two) times daily. Gargle and rinse your mouth with water after each use of this medication to help prevent dryness,   Yes [provider]  Multiple Vitamin (MULTIVITAMIN WITH MINERALS) TABS tablet Take 1 tablet by mouth daily. 06/06/18  Yes Mercy Riding, MD  Nutritional Supplements (NUTRITIONAL SUPPLEMENT PO) Take 1 each by mouth. Magic Cup with L/D meal   Yes [provider]  polyethylene glycol (MIRALAX / GLYCOLAX) packet Take 17 g by mouth daily as needed. Patient taking differently: Take 17 g by mouth daily as needed for mild constipation.  07/21/18  Yes [name], MD  promethazine (PHENERGAN) 12.5 MG tablet Take 12.5 mg by mouth 3 (three) times daily as needed for nausea or vomiting.   Yes [provider]  QUEtiapine (SEROQUEL) 50 MG tablet Take 50 mg by mouth at bedtime.   Yes [provider]  tamsulosin (FLOMAX) 0.4 MG CAPS capsule Take 1 capsule (0.4 mg total) by mouth daily. 03/20/19  Yes Paz, [name], MD  verapamil (VERELAN PM) 120 MG 24 hr capsule Take 120 mg by mouth at bedtime.   Yes [provider]  warfarin (COUMADIN) 2 MG tablet Take 2 mg by mouth daily.   Yes [provider]  NON FORMULARY Diet order: Regular/thin diet, Continue Liberalized diet per dietary 01/19/20   [provider]    Allergies    Patient has no known allergies.  Review of Systems   Review of Systems  Unable to perform ROS: Dementia  Constitutional: Negative for fever.  Respiratory: Negative for shortness of breath.   Cardiovascular: Negative for chest pain.  Gastrointestinal: Negative for abdominal pain.  Musculoskeletal: Negative for neck pain.  Neurological: Positive for facial asymmetry and weakness (Left). Negative for dizziness.  All other  systems reviewed and are negative.  Physical Exam Updated Vital Signs BP (!) 143/58   Pulse (!) 57   Temp (!) 97.5 F (36.4 C) (Oral)   Resp 14   Wt 64.8 kg   SpO2 100%   BMI 25.31 kg/m   Physical Exam Vitals and nursing note reviewed.  Constitutional:      General: She is not in acute distress.    Appearance: She is well-developed. She is not ill-appearing, toxic-appearing or diaphoretic.     Comments: Pleasant elderly female, no acute distress  HENT:     Head: Normocephalic and atraumatic.     Nose: Nose normal.     Mouth/Throat:     Mouth: Mucous membranes are dry.  Eyes:     Pupils: Pupils are equal, round, and reactive to light.  Neck:     Trachea: Phonation normal.     Comments: Full ROM to neck Cardiovascular:     Rate and Rhythm: Normal rate and regular rhythm.     Pulses: Normal pulses.     Heart sounds: Normal heart sounds.  Pulmonary:     Effort: Pulmonary effort is normal. No respiratory distress.     Breath sounds: Normal breath sounds.  Abdominal:     General: Bowel sounds are normal. There is no distension.     Palpations: Abdomen is soft.     Tenderness: There is no abdominal tenderness.  Musculoskeletal:        General: Normal range of motion.     Cervical back: Normal range of motion and neck supple.     Comments: No bony tenderness.  Moves all 4 extremities without difficulty.  Compartments soft.  Skin:    General: Skin is warm and dry.     Capillary Refill: Capillary refill takes 2 to 3 seconds.  Neurological:     Mental Status: She is alert.     Comments: Mental Status:  Alert to name, DOB however year 2010, location [place]. Speech fluent without evidence of aphasia. Able to follow 2 step commands without difficulty.  Cranial Nerves:  II:  Peripheral visual fields grossly normal, pupils equal, round, reactive to light III,IV, VI: ptosis not present, extra-ocular motions intact bilaterally  V,VII: smile symmetric, facial light touch  sensation equal VIII: hearing grossly normal bilaterally  IX,X: midline uvula rise  XI: bilateral shoulder shrug equal and strong XII: midline tongue extension  Motor:  5/5 in upper and lower extremities bilaterally including strong and  equal grip strength  Sensory: intact sensation Cerebellar: normal finger-to-nose with bilateral upper extremities CV: distal pulses palpable throughout      ED Results / Procedures / Treatments   Labs (all labs ordered are listed, but only abnormal results are displayed) Labs Reviewed  COMPREHENSIVE METABOLIC PANEL - Abnormal; Notable for the following components:      Result Value   Chloride 117 (*)    CO2 19 (*)    BUN 28 (*)    Calcium 7.2 (*)    Total Protein 4.8 (*)    Albumin 2.6 (*)    GFR, Estimated 60 (*)    All other components within normal limits  CBC WITH DIFFERENTIAL/PLATELET - Abnormal; Notable for the following components:   RDW 16.1 (*)    Platelets 94 (*)    All other components within normal limits  I-STAT CHEM 8, ED - Abnormal; Notable for the following components:   Sodium 147 (*)    Chloride 115 (*)    BUN 34 (*)    Glucose, Bld 69 (*)    Calcium, Ion 1.01 (*)    TCO2 20 (*)    Hemoglobin 11.2 (*)    HCT 33.0 (*)    All other components within normal limits  RESPIRATORY PANEL BY RT PCR (FLU A&B, COVID)  ETHANOL  RAPID URINE DRUG SCREEN, HOSP PERFORMED  URINALYSIS, ROUTINE W REFLEX MICROSCOPIC  PROTIME-INR  APTT  HEMOGLOBIN A1C  LIPID PANEL  PREALBUMIN  CBG MONITORING, ED    EKG EKG Interpretation  Date/Time:  Monday May 27 2020 09:07:34 EST Ventricular Rate:  49 PR Interval:    QRS Duration: 110 QT Interval:  475 QTC Calculation: 429 R Axis:   -56 Text Interpretation: Atrial fibrillation with bradycardia Left anterior fascicular block Borderline repolarization abnormality Confirmed by Noemi Chapel [phone]) on 05/27/2020 9:28:36 AM   Radiology CT HEAD CODE STROKE WO CONTRAST  Result Date:  05/27/2020 CLINICAL DATA:  Code stroke.  Neuro deficit, acute, stroke suspected EXAM: CT HEAD WITHOUT CONTRAST TECHNIQUE: Contiguous axial images were obtained from the base of the skull through the vertex without intravenous contrast. COMPARISON:  06/01/2018 head CT and prior. FINDINGS: Please note image quality is mildly degraded by motion artifact. Brain: No intracranial hemorrhage. Small left basal ganglia hypodensity, new since prior exam (6:15). Left posterior cranial fossa arachnoid cyst, unchanged. No midline shift, ventriculomegaly or extra-axial fluid collection. Diffuse cerebral volume loss with ex vacuo dilatation. Confluent supratentorial white matter hypodensities are unchanged. Chronic right basal ganglia lacunar insult versus dilated perivascular space is unchanged. Vascular: No hyperdense vessel. Bilateral skull base atherosclerotic calcifications. Skull: Negative for fracture or focal lesion. Sinuses/Orbits: No acute orbital finding. Mild pansinus mucosal thickening. Other: None. ASPECTS [facility] Stroke Program Early CT Score) - Ganglionic level infarction (caudate, lentiform nuclei, internal capsule, insula, M1-M3 cortex): 6 - Supraganglionic infarction (M4-M6 cortex): 3 Total score (0-10 with 10 being normal): 9 IMPRESSION: 1. Small age indeterminate left basal ganglia hypodensity may reflect sequela of prior insult versus dilated perivascular space. 2. Chronic right basal gangliar lacunar insult. 3. Age-related cerebral atrophy and chronic microvascular ischemic changes. 4. ASPECTS is 9 Code stroke imaging results were communicated on 05/27/2020 at 9:11 am to provider Dr. Lorrin Goodell via secure text paging. Electronically Signed   By: Primitivo Gauze M.D.   On: 05/27/2020 09:15   Procedures Procedures (including critical care time)  Medications Ordered in ED Medications  dextrose 50 % solution 50 mL (0 mLs Intravenous Hold 05/27/20 1110)  stroke: mapping our early stages of recovery  book (has no administration in time range)  0.9 %  sodium chloride infusion (has no administration in time range)  acetaminophen (TYLENOL) tablet 650 mg (has no administration in time range)    Or  acetaminophen (TYLENOL) 160 MG/5ML solution 650 mg (has no administration in time range)    Or  acetaminophen (TYLENOL) suppository 650 mg (has no administration in time range)   ED Course  I have reviewed the triage vital signs and the nursing notes.  Pertinent labs & imaging results that were available during my care of the patient were reviewed by me and considered in my medical decision making (see chart for details).  Patient presents for evaluation of code stroke.  Extensive cardiac and neurologic history for prior CVA and TIA on chronic Warfarin.  Last known normal 0700.  On arrival patient with reassuring exam without any focal neurologic deficits.  Does have some dementia at baseline however alert to name, date of birth and location of [place]. Airway cleared by attending at bridge and Neurology at bedside.  Taken immediately to CT scanner with labs pending.  Reassessed after CT scanner.  She continues have a nonfocal neuro exam without deficits.  Labs and imaging pending.  Labs and imaging personally reviewed and interpreted:  CMP with Mild hyperglycemia on labs.  Given amp of D50 given blood sugar in 60s.  Mild elevation in chloride, CO2 19, BUN 28, calcium 7.2 CBC without acute findings CT head with age indaminate left ganglia hypodensity and chronic right basilar lacunar insults EKG without ischemia  Patient reassessed.  She continues to be at her baseline mentation per daughter in room.  I have discussed her CT imaging results as well as her labs with admission for TIA or CVA work-up.  Daughter is agreeable to this.  Neuro recommends further inpatient management for CVA/TIA work-up.  Plan on admission to medicine.  CONSULT with Dr. Tamala Julian with [facility] who agrees to evaluate patient  for admission.  The patient appears reasonably stabilized for admission considering the current resources, flow, and capabilities available in the ED at this time, and I doubt any other [facility] requiring further screening and/or treatment in the ED prior to admission.  Patient personally evaluated by attending, Dr. Sabra Heck who agrees above treatment, plan and disposition.     MDM Rules/Calculators/A&P                           Final Clinical Impression(s) / ED Diagnoses Final diagnoses:  TIA (transient ischemic attack)  Hypoglycemia    Rx / DC Orders ED Discharge Orders    None       Angas Isabell A, PA-C 05/27/20 1118    Noemi Chapel, MD 05/27/20 1313

## 2020-05-27 NOTE — Progress Notes (Signed)
PT is very kyphotic. Tried to position patient on the table. Patient stated that it is too painful and she refused scan. Notified RN of need for pain meds and sent back to ED

## 2020-05-27 NOTE — Progress Notes (Signed)
Carotid duplex has been completed.   Preliminary results in CV Proc.   [name] 05/27/2020 3:04 PM

## 2020-05-27 NOTE — Code Documentation (Addendum)
Stroke Response Nurse Documentation Code Documentation  [name] is a 86 y.o. female arriving to [facility] ED via [facility] on 05/27/2020 with past medical hx of HTN, TIA, CVA, CHF, CKD, PE, seizures, afib, Dementia with psychosis. Code stroke was activated by EMS. Patient from SNF where she was LKW at 0700 and was noted to have L weakness and L facial droop by physical therapy when they entered her room at 0715. On aspirin 81 mg daily and warfarin daily. Stroke team at the bedside on patient arrival. Labs drawn and patient cleared for CT by Dr. Sabra Heck. Patient to CT with team. NIHSS 0, see documentation for details and code stroke times. Patient with resolution of symptoms on my exam. The following imaging was completed:  CT head. Patient is not a candidate for tPA due to resolution of symptoms. Care/Plan: TIA alert, q2h neuro checks with VS for 12h, followed by q4h. Bedside handoff with ED RN [name].    [name]  Stroke Response RN [phone]

## 2020-05-27 NOTE — Progress Notes (Signed)
ANTICOAGULATION CONSULT NOTE - Initial Consult  Pharmacy Consult for warfarin Indication: atrial fibrillation  No Known Allergies  Patient Measurements: Weight: 64.8 kg (142 lb 13.7 oz)  Vital Signs: Temp: 97.5 F (36.4 C) (11/22 0908) Temp Source: Oral (11/22 0908) BP: 143/58 (11/22 1045) Pulse Rate: 57 (11/22 1045)  Labs: Recent Labs    05/27/20 0846 05/27/20 0847 05/27/20 0912  HGB 11.2*  --  12.9  HCT 33.0*  --  42.3  PLT  --   --  94*  CREATININE 0.80 0.91  --     Estimated Creatinine Clearance: 36.5 mL/min (by C-G formula based on SCr of 0.91 mg/dL).   Medical History: Past Medical History:  Diagnosis Date  . Acute encephalopathy   . Acute renal failure superimposed on stage 3 chronic kidney disease ([place])   . Anemia   . Atrial fibrillation ([place])   . Colon cancer ([place]) 12/11  . Colon cancer ([place]) 2011  . Diarrhea   . DVT (deep venous thrombosis) ([place]) 11/06  . Elevated brain natriuretic peptide (BNP) level   . Frequent headaches   . Heart murmur   . Hyperkalemia   . Hypertension   . Pulmonary embolism ([place]) 11/06  . Seizures ([place])   . Stroke ([place]) 11/30/2014  . Urine retention 01/2019   Assessment: 69 YOF presenting as code stroke with left sided weakness, hx of afib on warfarin PTA, sx resolved and no tPA given.  OK to continue warfarin per neuro.  INR on admission 1.9 with last dose 11/21, H/H wnl, plts 94 (chronic)  PTA dosing: 2mg  daily per MAR  Goal of Therapy:  INR 2-3 Monitor platelets by anticoagulation protocol: Yes   Plan:  Give warfarin 2.5mg  PO x 1 today Daily INR, s/s bleeding  Bertis Ruddy, PharmD Clinical Pharmacist ED Pharmacist Phone # [phone] 05/27/2020 12:37 PM

## 2020-05-27 NOTE — CV Procedure (Signed)
Echocardiogram not completed. Ms. Brasel currently undergoing X-Ray and then going to MRI.  Darlina Sicilian RDCS

## 2020-05-27 NOTE — Evaluation (Signed)
Clinical/Bedside Swallow Evaluation Patient Details  Name: LEANDRIA THIER MRN: [id number] Date of Birth: 1926-11-05  Today's Date: 05/27/2020 Time: SLP Start Time (ACUTE ONLY): 1107 SLP Stop Time (ACUTE ONLY): 1122 SLP Time Calculation (min) (ACUTE ONLY): 15 min  Past Medical History:  Past Medical History:  Diagnosis Date  . Acute encephalopathy   . Acute renal failure superimposed on stage 3 chronic kidney disease ([place])   . Anemia   . Atrial fibrillation ([place])   . Colon cancer ([place]) 12/11  . Colon cancer ([place]) 2011  . Diarrhea   . DVT (deep venous thrombosis) ([place]) 11/06  . Elevated brain natriuretic peptide (BNP) level   . Frequent headaches   . Heart murmur   . Hyperkalemia   . Hypertension   . Pulmonary embolism ([place]) 11/06  . Seizures ([place])   . Stroke ([place]) 11/30/2014  . Urine retention 01/2019   Past Surgical History:  Past Surgical History:  Procedure Laterality Date  . APPENDECTOMY  1952  . COLECTOMY  06/2010   partial, Dr. Donne Hazel complicated by LLE CVT; S/P IVC umbrella & anemia  . HIP FRACTURE SURGERY  2006   Trauma  . TOTAL ABDOMINAL HYSTERECTOMY W/ BILATERAL SALPINGOOPHORECTOMY  1973   For Fibroids  . TOTAL HIP ARTHROPLASTY  01/03/07   Left hip replacement.  Marland Kitchen VENA CAVA FILTER PLACEMENT  06/28/2010   HPI:  ANALIZ TVEDT is a 86 y.o. female arriving to [place] ED via [facility] on 05/27/2020 with past medical hx of HTN, TIA, CVA, CHF, CKD, PE, seizures, afib, Dementia with psychosis. Code stroke was activated by EMS. Patient from SNF where she was LKW at 0700 and was noted to have L weakness and L facial droop by speech therapy when they entered her room at 0715. Pt has a vague history of dyspahgia per family at bedside; she often reports food wont go down, but daugher and pt deny esophageal symptoms and say that this is pts way of saying she doesnt want to eat. She has not had any dysphagia imaging that can be found in chart. Pt is  apparently being seen at [facility] by SLP. She had a CXR in October concerning for right lobe pna. Pt has poor dentition that cannot be addressed due to need for anticoagulation.    Assessment / Plan / Recommendation Clinical Impression  Pt demonstrates no signs of aspiration or pharyngeal dysphagia. Daughter at bedside reports pt has poor dentition and poor appetite, but generally tolerates things that she enjoys well. She has not had recent concern for aspiration. We discussed recent CXR with possible right pna, which likely triggered SLP consult at SNF. At this time pt is capable of continuing her baseline diet and shows no signs of significant dysphagia or concern for aspiration. Will resume a regular diet, unrestricted so that daughter can order meals. No SLP f/u warranted, will also defer cognitive eval as pt is functioning at baseline with dementia.  SLP Visit Diagnosis: Dysphagia, unspecified (R13.10)    Aspiration Risk  Mild aspiration risk    Diet Recommendation Regular;Thin liquid   Liquid Administration via: Cup;Straw Medication Administration: Whole meds with liquid (Pt prefers ensure with meds) Supervision: Patient able to self feed    Other  Recommendations Oral Care Recommendations: Oral care BID   Follow up Recommendations 24 hour supervision/assistance      Frequency and Duration            Prognosis  Swallow Study   General HPI: HENCHY MCCAULEY is a 86 y.o. female arriving to [place] ED via [facility] on 05/27/2020 with past medical hx of HTN, TIA, CVA, CHF, CKD, PE, seizures, afib, Dementia with psychosis. Code stroke was activated by EMS. Patient from SNF where she was LKW at 0700 and was noted to have L weakness and L facial droop by speech therapy when they entered her room at 0715. Pt has a vague history of dyspahgia per family at bedside; she often reports food wont go down, but daugher and pt deny esophageal symptoms and say that  this is pts way of saying she doesnt want to eat. She has not had any dysphagia imaging that can be found in chart. Pt is apparently being seen at [facility] by SLP. She had a CXR in October concerning for right lobe pna. Pt has poor dentition that cannot be addressed due to need for anticoagulation.  Type of Study: Bedside Swallow Evaluation Diet Prior to this Study: NPO Temperature Spikes Noted: No Respiratory Status: Room air History of Recent Intubation: No Behavior/Cognition: Alert;Cooperative;Pleasant mood Oral Cavity Assessment: Within Functional Limits Oral Care Completed by SLP: No Oral Cavity - Dentition: Poor condition Vision: Functional for self-feeding Self-Feeding Abilities: Able to feed self Patient Positioning: Upright in bed Baseline Vocal Quality: Normal Volitional Cough: Strong Volitional Swallow: Able to elicit    Oral/Motor/Sensory Function Overall Oral Motor/Sensory Function: Within functional limits   Ice Chips     Thin Liquid Thin Liquid: Within functional limits Presentation: Cup;Straw    Nectar Thick Nectar Thick Liquid: Not tested   Honey Thick Honey Thick Liquid: Not tested   Puree Puree: Within functional limits   Solid     Solid: Within functional limits      Daveah Varone, Katherene Ponto 05/27/2020,11:29 AM

## 2020-05-27 NOTE — ED Notes (Signed)
Pt back from MRI 

## 2020-05-27 NOTE — H&P (Signed)
History and Physical    [name] BHA:[id number] DOB: 12-Aug-1926 DOA: 05/27/2020  Referring MD/NP/PA: Morton Amy, PA-C PCP: Megan Curry, DO  Patient coming from: [name]  Chief Complaint: Left-sided weakness  I have personally briefly reviewed patient's old medical records in [name]   HPI: [name] is a 86 y.o. female with medical history significant of persistent atrial fibrillation on chronic anticoagulation, hypertension, CVA, DVT/PE, colon cancer s/p resection who presented after being found to have acute left-sided weakness with left facial droop at around 7:15 AM by physical therapy. She had reportedly been at her baseline around 7 AM. Patient notes that when she woke up she was actually getting ready for breakfast and was hungry for change. Patient feels as though her speech has been somewhat slurred over the last month, but her daughter at bedside denies this. Denies any significant headache, chest pain, nausea, vomiting, or leg pain/swelling. She has been on Coumadin and aspirin given prior history at the advisement of Dr. Angelena Hendricks of cardiologist for several years. Couple months ago they had attempted to stop her Coumadin, but restarted this medication after daughter advised them of her high risk of clotting. The only the thing the patient complains of now is that she is having some right lower abdominal pain. She reports that it feels swollen and tight at times. She still is able to pass flatus and last had a bowel movement yesterday evening. Approximately 2 weeks ago patient had tested positive for Covid on, but had received both of her vaccines and had no symptoms. Patient reports that her symptoms of weakness have resolved at this time.  ED Course: On admission to the emergency department presented as a code stroke was seen by neurology with initial initial CT scan of the head showing a small hypodensity in the left basal ganglia that was  age-indeterminate. Patient was not a TPA candidate due to being on anticoagulation. Vital signs were otherwise noted to be within normal limits.  Labs significant for platelets 94, BUN 28 and creatinine 0.91. COVID-19 and influenza screening were both negative.  Review of Systems  Constitutional: Negative for fever.  HENT: Negative for nosebleeds.   Eyes: Negative for photophobia.  Respiratory: Negative for cough and shortness of breath.   Cardiovascular: Negative for chest pain and leg swelling.  Gastrointestinal: Positive for abdominal pain. Negative for blood in stool, nausea and vomiting.  Genitourinary: Negative for dysuria and hematuria.  Musculoskeletal: Negative for falls.  Skin: Negative for itching.  Neurological: Positive for speech change and focal weakness.  Endo/Heme/Allergies: Bruises/bleeds easily.  Psychiatric/Behavioral: Negative for substance abuse. The patient is nervous/anxious.     Past Medical History:  Diagnosis Date  . Acute encephalopathy   . Acute renal failure superimposed on stage 3 chronic kidney disease ([place])   . Anemia   . Atrial fibrillation ([place])   . Colon cancer ([place]) 12/11  . Colon cancer ([name]) 2011  . Diarrhea   . DVT (deep venous thrombosis) ([place]) 11/06  . Elevated brain natriuretic peptide (BNP) level   . Frequent headaches   . Heart murmur   . Hyperkalemia   . Hypertension   . Pulmonary embolism ([name]) 11/06  . Seizures ([name])   . Stroke ([name]) 11/30/2014  . Urine retention 01/2019    Past Surgical History:  Procedure Laterality Date  . APPENDECTOMY  1952  . COLECTOMY  06/2010   partial, Dr. Donne Hazel complicated by LLE CVT; S/P IVC umbrella &  anemia  . HIP FRACTURE SURGERY  2006   Trauma  . TOTAL ABDOMINAL HYSTERECTOMY W/ BILATERAL SALPINGOOPHORECTOMY  1973   For Fibroids  . TOTAL HIP ARTHROPLASTY  01/03/07   Left hip replacement.  Marland Kitchen VENA CAVA FILTER PLACEMENT  06/28/2010     reports that she is a non-smoker but has been exposed  to tobacco smoke. She has never used smokeless tobacco. She reports that she does not drink alcohol and does not use drugs.  No Known Allergies  Family History  Problem Relation Age of Onset  . Peripheral vascular disease Father   . Heart attack Mother 21  . Heart disease Mother        MI  . Coronary artery disease Sister   . Diabetes Paternal Grandmother   . Coronary artery disease Maternal Grandfather     Prior to Admission medications   Medication Sig Start Date End Date Taking? Authorizing Provider  acetaminophen (TYLENOL) 500 MG tablet Take 1,000 mg by mouth every 8 (eight) hours as needed for headache (pain).    Yes [provider]  albuterol (ACCUNEB) 1.25 MG/3ML nebulizer solution Take 2.5 mg by nebulization every 6 (six) hours as needed for wheezing or shortness of breath.    Yes [provider]  albuterol (VENTOLIN HFA) 108 (90 Base) MCG/ACT inhaler Inhale 2 puffs into the lungs every 6 (six) hours as needed for wheezing or shortness of breath.   Yes [provider]  aspirin 81 MG chewable tablet Chew 81 mg by mouth daily.   Yes [provider]  atorvastatin (LIPITOR) 20 MG tablet Take 1 tablet (20 mg total) by mouth daily. 07/04/19  Yes Paz, [name], MD  Calcium Carbonate-Vitamin D (OYSTER SHELL/VITAMIN D PO) Take 1 tablet by mouth daily. With breakfast   Yes [provider]  Ensure (ENSURE) Take 237 mLs by mouth in the morning, at noon, and at bedtime.   Yes [provider]  escitalopram (LEXAPRO) 10 MG tablet Take 10 mg by mouth daily.   Yes [provider]  guaiFENesin (MUCINEX) 600 MG 12 hr tablet Take 600 mg by mouth 2 (two) times daily.  03/21/20  Yes [provider]  hydrocortisone (ANUSOL-HC) 2.5 % rectal cream Place 1 application rectally 2 (two) times daily as needed for hemorrhoids or anal itching.   Yes [provider]  ipratropium-albuterol (DUONEB) 0.5-2.5 (3) MG/3ML SOLN Take 3 mLs by  nebulization every 6 (six) hours. For coughing, wheezing, and shortness of breath.   Yes [provider]  isosorbide mononitrate (IMDUR) 30 MG 24 hr tablet Take 15 mg by mouth daily.   Yes [provider]  loratadine (CLARITIN) 10 MG tablet Take 10 mg by mouth daily.   Yes [provider]  Metoprolol Tartrate 37.5 MG TABS Take 1 tablet by mouth 2 (two) times daily.   Yes [provider]  mometasone-formoterol (DULERA) 100-5 MCG/ACT AERO Inhale 2 puffs into the lungs 2 (two) times daily. Gargle and rinse your mouth with water after each use of this medication to help prevent dryness,   Yes [provider]  Multiple Vitamin (MULTIVITAMIN WITH MINERALS) TABS tablet Take 1 tablet by mouth daily. 06/06/18  Yes Mercy Riding, MD  Nutritional Supplements (NUTRITIONAL SUPPLEMENT PO) Take 1 each by mouth. Magic Cup with L/D meal   Yes [provider]  polyethylene glycol (MIRALAX / GLYCOLAX) packet Take 17 g by mouth daily as needed. Patient taking differently: Take 17 g by mouth  daily as needed for mild constipation.  07/21/18  Yes [name], MD  promethazine (PHENERGAN) 12.5 MG tablet Take 12.5 mg by mouth 3 (three) times daily as needed for nausea or vomiting.   Yes [provider]  QUEtiapine (SEROQUEL) 50 MG tablet Take 50 mg by mouth at bedtime.   Yes [provider]  tamsulosin (FLOMAX) 0.4 MG CAPS capsule Take 1 capsule (0.4 mg total) by mouth daily. 03/20/19  Yes Paz, [name], MD  verapamil (VERELAN PM) 120 MG 24 hr capsule Take 120 mg by mouth at bedtime.   Yes [provider]  warfarin (COUMADIN) 2 MG tablet Take 2 mg by mouth daily.   Yes [provider]  NON FORMULARY Diet order: Regular/thin diet, Continue Liberalized diet per dietary 01/19/20   [provider]    Physical Exam:  Constitutional: NAD, calm, comfortable Vitals:   05/27/20 0930 05/27/20 0945 05/27/20 1000 05/27/20 1015  BP:  132/82 (!) 110/46 (!) 125/56 (!) 141/58  Pulse: 76 (!) 55 (!) 55 (!) 56  Resp: 14 16 20 14   Temp:      TempSrc:      SpO2: 100% 99% 99% 100%  Weight:       Eyes: PERRL, lids and conjunctivae normal ENMT: Mucous membranes are moist. Posterior pharynx clear of any exudate or lesions.Normal dentition.  Neck: normal, supple, no masses, no thyromegaly Respiratory: clear to auscultation bilaterally, no wheezing, no crackles. Normal respiratory effort. No accessory muscle use.  Cardiovascular: Regular rate and rhythm, no murmurs / rubs / gallops. No extremity edema. 2+ pedal pulses. No carotid bruits.  Abdomen: no tenderness, no masses palpated. No hepatosplenomegaly. Bowel sounds positive.  Musculoskeletal: no clubbing / cyanosis. No joint deformity upper and lower extremities. Good ROM, no contractures. Normal muscle tone.  Skin: no rashes, lesions, ulcers. No induration Neurologic: CN 2-12 grossly intact. Sensation intact, DTR normal. Strength 5/5 in all 4.  Psychiatric: Normal judgment and insight. Alert and oriented x 3. Normal mood.     Labs on Admission: I have personally reviewed following labs and imaging studies  CBC: Recent Labs  Lab 05/27/20 0846 05/27/20 0912  WBC  --  5.8  NEUTROABS  --  4.1  HGB 11.2* 12.9  HCT 33.0* 42.3  MCV  --  96.6  PLT  --  94*   Basic Metabolic Panel: Recent Labs  Lab 05/27/20 0846 05/27/20 0847  NA 147* 144  K 3.9 4.0  CL 115* 117*  CO2  --  19*  GLUCOSE 69* 71  BUN 34* 28*  CREATININE 0.80 0.91  CALCIUM  --  7.2*   GFR: Estimated Creatinine Clearance: 36.5 mL/min (by C-G formula based on SCr of 0.91 mg/dL). Liver Function Tests: Recent Labs  Lab 05/27/20 0847  AST 21  ALT 10  ALKPHOS 44  BILITOT 0.8  PROT 4.8*  ALBUMIN 2.6*   No results for input(s): LIPASE, AMYLASE in the last 168 hours. No results for input(s): AMMONIA in the last 168 hours. Coagulation Profile: No results for input(s): INR, PROTIME in the last 168  hours. Cardiac Enzymes: No results for input(s): CKTOTAL, CKMB, CKMBINDEX, TROPONINI in the last 168 hours. BNP (last 3 results) No results for input(s): PROBNP in the last 8760 hours. HbA1C: No results for input(s): HGBA1C in the last 72 hours. CBG: Recent Labs  Lab 05/27/20 0839  GLUCAP 82   Lipid Profile: No results for input(s): CHOL, HDL, LDLCALC, TRIG, CHOLHDL, LDLDIRECT in the last  72 hours. Thyroid Function Tests: No results for input(s): TSH, T4TOTAL, FREET4, T3FREE, THYROIDAB in the last 72 hours. Anemia Panel: No results for input(s): VITAMINB12, FOLATE, FERRITIN, TIBC, IRON, RETICCTPCT in the last 72 hours. Urine analysis:    Component Value Date/Time   COLORURINE YELLOW 04/07/2019 2050   APPEARANCEUR HAZY (A) 04/07/2019 2050   APPEARANCEUR Cloudy (A) 01/13/2018 1619   LABSPEC 1.008 04/07/2019 2050   PHURINE 6.0 04/07/2019 2050   GLUCOSEU NEGATIVE 04/07/2019 2050   GLUCOSEU NEGATIVE 09/28/2018 0939   HGBUR NEGATIVE 04/07/2019 2050   [place] NEGATIVE 04/07/2019 2050   BILIRUBINUR Negative 01/13/2018 1619   KETONESUR NEGATIVE 04/07/2019 2050   PROTEINUR NEGATIVE 04/07/2019 2050   UROBILINOGEN 0.2 09/28/2018 0939   NITRITE NEGATIVE 04/07/2019 2050   LEUKOCYTESUR LARGE (A) 04/07/2019 2050   Sepsis Labs: No results found for this or any previous visit (from the past 240 hour(s)).   Radiological Exams on Admission: CT HEAD CODE STROKE WO CONTRAST  Result Date: 05/27/2020 CLINICAL DATA:  Code stroke.  Neuro deficit, acute, stroke suspected EXAM: CT HEAD WITHOUT CONTRAST TECHNIQUE: Contiguous axial images were obtained from the base of the skull through the vertex without intravenous contrast. COMPARISON:  06/01/2018 head CT and prior. FINDINGS: Please note image quality is mildly degraded by motion artifact. Brain: No intracranial hemorrhage. Small left basal ganglia hypodensity, new since prior exam (6:15). Left posterior cranial fossa arachnoid cyst, unchanged.  No midline shift, ventriculomegaly or extra-axial fluid collection. Diffuse cerebral volume loss with ex vacuo dilatation. Confluent supratentorial white matter hypodensities are unchanged. Chronic right basal ganglia lacunar insult versus dilated perivascular space is unchanged. Vascular: No hyperdense vessel. Bilateral skull base atherosclerotic calcifications. Skull: Negative for fracture or focal lesion. Sinuses/Orbits: No acute orbital finding. Mild pansinus mucosal thickening. Other: None. ASPECTS [facility] Stroke Program Early CT Score) - Ganglionic level infarction (caudate, lentiform nuclei, internal capsule, insula, M1-M3 cortex): 6 - Supraganglionic infarction (M4-M6 cortex): 3 Total score (0-10 with 10 being normal): 9 IMPRESSION: 1. Small age indeterminate left basal ganglia hypodensity may reflect sequela of prior insult versus dilated perivascular space. 2. Chronic right basal gangliar lacunar insult. 3. Age-related cerebral atrophy and chronic microvascular ischemic changes. 4. ASPECTS is 9 Code stroke imaging results were communicated on 05/27/2020 at 9:11 am to provider Dr. Lorrin Goodell via secure text paging. Electronically Signed   By: Primitivo Gauze M.D.   On: 05/27/2020 09:15    EKG: Independently reviewed.  Atrial fibrillation at 49 bpm  Assessment/Plan Left-sided weakness suspected TIA History of CVA: Acute.  Patient presents after being found to have left-sided facial droop and weakness around 7:15 AM.  CT revealed a age-indeterminate left basal ganglia density.  Her symptoms now have resolved and she was not a candidate for TPA due to her being on Coumadin. -Admit to telemetry bed -Stroke order set initiated -Neuro checks -Check  MRI/MRA head w/o contrast  and vascular carotid U/S  -Check echocardiogram -Check Hemoglobin A1c and lipid panel in a.m. -Allow for 24 hours for permissive hypertension not treating unless blood pressure greater than 220 -PT/OT/Speech to evaluate  and treat -Family requests continuation of aspirin and anticoagulant be continued as it was started by Dr. Angelena Hendricks -Appreciate neurology consultative services, will follow-up for any further recommendations  Urinary tract infection: Acute.  Urinalysis was positive for large leukocytes, rare bacteria, and greater than 50 WBCs. -Check urine culture -Rocephin IV, discontinue with UTI thought to be less likely.  Persistent atrial fibrillation with subtherapeutic INR: INR is  mildly subtherapeutic at 1.9. -Continue Coumadin per pharmacy  -Restart home blood pressure medications when medically appropriate   Abdominal pain constipation: Acute.  Patient complains of of right lower quadrant abdominal pain.  On physical exam patient is tender to palpation in the left quadrant. -Check abdominal x-ray (which revealed large stool burden overall) -Start Senokot-S -May warrant CT of the abdomen for further evaluation   Essential hypertension: Blood pressures currently maintained within normal limits. -Allow for permissive hypertension as advised by neurology  Protein calorie malnutrition: On admission albumin noted to be 2.9. -Check prealbumin -Ensure shakes with meals  History of colon cancer: Patient with history of colon cancer back in 2011 status post resection.  She was released from oncology care after 5 years of surveillance.  History of DVT/PE -On chronic anticoagulation  Thrombocytopenia: Chronic.  Platelet count 94. -Continue to monitor  DVT prophylaxis: Coumadin per pharmacy Code Status: DNR present on admission Family Communication: Daughter updated at bedside Disposition Plan: TBD  Consults called: Neurology  Admission status: observation Norval Morton MD [facility] Pager [phone]   If 7PM-7AM, please contact night-coverage [email or web address] Password [facility]  05/27/2020, 10:43 AM

## 2020-05-27 NOTE — ED Notes (Signed)
Patient transported to MRI via stretcher in stable condition 

## 2020-05-27 NOTE — ED Notes (Signed)
Speech therapist at bedside at this time for swallow eval.

## 2020-05-27 NOTE — ED Notes (Signed)
Notified by MRI patinet in too much pain to lay flat for MRI.

## 2020-05-27 NOTE — Consult Note (Signed)
NEUROLOGY CONSULTATION NOTE   Date of service: May 27, 2020 Patient Name: [name] MRN:  [id number] DOB:  [birth year] Reason for consult: "Stroke code"  History of Present Illness  [name] is a 86 y.o. female with PMH significant for CKD3, Afibb on warfarin, dementia, hx of DVT and PE s/p IVC filter placement, seizures and prior stroke, hx of colon cancer s/p colectomy who presents with sudden onset L sided weakness that resolved.  Per EMS, patient was at her facility today. She woke up, had her breakfast and was fine. She had acute onset L sided weakness with weak hand grip and a left facial droop with a LKW of 0700 on 05/27/20.  On presentation, symptoms had resolved and she did not have any focal weakness or facial droop, no dysarthria, no vision field cut.  Spoke to [employer] LPN who took care of her yesterday and this AM at her facility. He went in at 0700 at shift change, she was fine and did not have any focal deficit. Speech therapy saw her at 0715 and noted L facial droop, dysarthria. Bedside assessment with L facial droop, dysarthria and decreased grip strength in left arm with drift. EMS called and she was brought in.   She got her warfarin last night. No evidence of seizure like activity that was noted by staff today or yesterday.  NIHSS: 0 MRS: 4(she is able to feed herself anf eat, otherwide needs assistance with toilet, she is incontinent and uses a wheel chair). LKW: 0700 on 05/27/20.   ROS   Constitutional Denies weight loss, fever and chills.  HEENT Denies changes in vision and hearing.  Respiratory Denies SOB and cough.  CV Denies palpitations and CP  GI Denies abdominal pain, nausea, vomiting and diarrhea.   GU Denies dysuria and urinary frequency.   MSK Denies myalgia and joint pain.   Skin Denies rash and pruritus.   Neurological Denies headache and syncope.   Psychiatric Denies recent changes in mood. Denies anxiety and depression.    Past  History   Past Medical History:  Diagnosis Date  . Acute encephalopathy   . Acute renal failure superimposed on stage 3 chronic kidney disease ([place])   . Anemia   . Atrial fibrillation ([place])   . Colon cancer ([place]) 12/11  . Colon cancer ([place]) 2011  . Diarrhea   . DVT (deep venous thrombosis) ([place]) 11/06  . Elevated brain natriuretic peptide (BNP) level   . Frequent headaches   . Heart murmur   . Hyperkalemia   . Hypertension   . Pulmonary embolism ([place]) 11/06  . Seizures ([place])   . Stroke ([place]) 11/30/2014  . Urine retention 01/2019   Past Surgical History:  Procedure Laterality Date  . APPENDECTOMY  1952  . COLECTOMY  06/2010   partial, Dr. Donne Hazel complicated by LLE CVT; S/P IVC umbrella & anemia  . HIP FRACTURE SURGERY  2006   Trauma  . TOTAL ABDOMINAL HYSTERECTOMY W/ BILATERAL SALPINGOOPHORECTOMY  1973   For Fibroids  . TOTAL HIP ARTHROPLASTY  01/03/07   Left hip replacement.  Marland Kitchen VENA CAVA FILTER PLACEMENT  06/28/2010   Family History  Problem Relation Age of Onset  . Peripheral vascular disease Father   . Heart attack Mother 61  . Heart disease Mother        MI  . Coronary artery disease Sister   . Diabetes Paternal Grandmother   . Coronary artery disease Maternal Grandfather    Social  History   Socioeconomic History  . Marital status: Widowed    Spouse name: Not on file  . Number of children: 1  . Years of education: Not on file  . Highest education level: Not on file  Occupational History  . Occupation: n/d    Employer: RETIRED  Tobacco Use  . Smoking status: Passive Smoke Exposure - Never Smoker  . Smokeless tobacco: Never Used  . Tobacco comment: husband smoked in home.   Vaping Use  . Vaping Use: Never used  Substance and Sexual Activity  . Alcohol use: No    Alcohol/week: 0.0 standard drinks  . Drug use: No  . Sexual activity: Not Currently  Other Topics Concern  . Not on file  Social History Narrative   Lives w/ her daughter    Social  Determinants of Health   Financial Resource Strain:   . Difficulty of Paying Living Expenses: Not on file  Food Insecurity:   . Worried About Charity fundraiser in the Last Year: Not on file  . Ran Out of Food in the Last Year: Not on file  Transportation Needs:   . Lack of Transportation (Medical): Not on file  . Lack of Transportation (Non-Medical): Not on file  Physical Activity:   . Days of Exercise per Week: Not on file  . Minutes of Exercise per Session: Not on file  Stress:   . Feeling of Stress : Not on file  Social Connections:   . Frequency of Communication with Friends and Family: Not on file  . Frequency of Social Gatherings with Friends and Family: Not on file  . Attends Religious Services: Not on file  . Active Member of Clubs or Organizations: Not on file  . Attends Archivist Meetings: Not on file  . Marital Status: Not on file   No Known Allergies  Medications  (Not in a hospital admission)    Vitals   Vitals:   05/27/20 0800  Weight: 64.8 kg     Body mass index is 25.31 kg/m.  Physical Exam   General: Laying comfortably in bed; in no acute distress. HENT: Normal oropharynx and mucosa. Normal external appearance of ears and nose. Neck: Supple, no pain or tenderness  CV: No JVD. No peripheral edema.  Pulmonary: Symmetric Chest rise. Normal respiratory effort.  Abdomen: Soft to touch, non-tender.  Ext: No cyanosis, edema, or deformity  Skin: No rash. Normal palpation of skin.   Musculoskeletal: Normal digits and nails by inspection. No clubbing.   Neurologic Examination  Mental status/Cognition: Alert, oriented to self, place, month and year, good attention.  Speech/language: Fluent, comprehension intact, object naming intact, repetition intact. Cranial nerves:   CN II Pupils equal and reactive to light, no VF deficits   CN III,IV,VI EOM intact, no gaze preference or deviation, no nystagmus   CN V normal sensation in V1, V2, and V3  segments bilaterally    CN VII no asymmetry, no nasolabial fold flattening    CN VIII normal hearing to speech    CN IX & X normal palatal elevation, no uvular deviation    CN XI 5/5 head turn and 5/5 shoulder shrug bilaterally    CN XII midline tongue protrusion    Motor:  Muscle bulk: poor, tone normal, pronator drift none tremor none Mvmt Root Nerve  Muscle Right Left Comments  SA C5/6 Ax Deltoid 5 5   EF C5/6 Mc Biceps 5 5   EE C6/7/8 Rad Triceps 5  5   WF C6/7 Med FCR 5 5   WE C7/8 PIN ECU 5 5   F Ab C8/T1 U ADM/FDI 5 5   HF L1/2/3 Fem Illopsoas 5 5   KE L2/3/4 Fem Quad 5 5   DF L4/5 D Peron Tib Ant 5 5   PF S1/2 Tibial Grc/Sol 5 5    Reflexes:  Right Left Comments  Pectoralis 1 1    Biceps (C5/6) 1 1   Brachioradialis (C5/6) 1 1    Triceps (C6/7) 1 1    Patellar (L3/4) 1 1    Achilles (S1)      [place]man      Plantar     Jaw jerk    Sensation:  Light touch Intact throughout   Pin prick    Temperature    Vibration   Proprioception    Coordination/Complex Motor:  - Finger to Nose intact BL - Heel to shin unable to assess but able to make a smooth circle in the air. - Rapid alternating movement are slowed. - Gait: deferred.  Labs   CBC:  Recent Labs  Lab 05/27/20 0846  HGB 11.2*  HCT 33.0*    Basic Metabolic Panel:  Lab Results  Component Value Date   NA 147 (H) 05/27/2020   K 3.9 05/27/2020   CO2 23 (A) 03/22/2020   GLUCOSE 69 (L) 05/27/2020   BUN 34 (H) 05/27/2020   CREATININE 0.80 05/27/2020   CALCIUM 8.7 03/22/2020   GFRNONAA 40.74 03/22/2020   GFRAA 47.22 03/22/2020   Lipid Panel:  Lab Results  Component Value Date   LDLCALC 45 04/10/2020   HgbA1c:  Lab Results  Component Value Date   HGBA1C 5.3 09/07/2019   Urine Drug Screen: No results found for: LABOPIA, COCAINSCRNUR, LABBENZ, AMPHETMU, THCU, LABBARB  Alcohol Level No results found for: [facility]   CTH without contrast: Personally reviewed and ?cisterna magna, small R external  capsule infarct that was also seen on prior TH from 2017. Otherwise, CTH was negative for a large hypodensity concerning for a large territory infarct or hyperdensity concerning for an ICH   Impression   MADHAVI HAMBLEN is a 86 y.o. female with PMH significant for CKD3, Afibb on warfarin, dementia, hx of DVT and PE s/p IVC filter placement, seizures and prior stroke, hx of colon cancer s/p colectomy who presents with sudden onset L sided weakness that resolved. Suspect this was a TIA with spontaneous resolution of symptoms.  Recommendations  Plan:  - Frequent Neuro checks per stroke unit protocol - Recommend brain imaging with MRI Brain without contrast - Recommend Vascular imaging with MRA Angio Head without contrast and US Carotid doppler - Recommend obtaining TTE - Recommend obtaining Lipid panel with LDL - Please start statin if LDL > 70 - Recommend HbA1c - Antithrombotic - Continue home Warfarin. No aspirin or plavix. - Recommend DVT ppx - SBP goal - permissive hypertension first 24 h < 220/110. Held home meds.  - Recommend Telemetry monitoring for arrythmia - Recommend bedside swallow screen prior to PO intake. - Stroke education booklet - Recommend PT/OT/SLP consult  ______________________________________________________________________   Thank you for the opportunity to take part in the care of this patient. If you have any further questions, please contact the neurology consultation attending.  Signed,  [place] Pager Number [phone]

## 2020-05-27 NOTE — ED Notes (Signed)
Pt in MRI.

## 2020-05-27 NOTE — ED Notes (Signed)
Pt clean and dry at this time. Soiled Brief removed and sheets changed. Purewick placed for voiding.

## 2020-05-27 NOTE — Progress Notes (Signed)
PT Cancellation Note  Patient Details Name: [name] MRN: [id number] DOB: July 04, 1927   Cancelled Treatment:    Reason Eval/Treat Not Completed: Patient at procedure or test/unavailable Pt currently out of room for test. Will follow up as schedule allows.   Reuel Derby, PT, DPT  Acute Rehabilitation Services  Pager: [phone] Office: [phone]    Rudean Hitt 05/27/2020, 1:22 PM

## 2020-05-27 NOTE — ED Notes (Signed)
This RN paged pharmacy to send missing medications and Ensure beverages as pt is requesting to take PO medications with Ensure instead of other PO Fluid.

## 2020-05-27 NOTE — ED Notes (Signed)
Report called and transport set up

## 2020-05-27 NOTE — Progress Notes (Signed)
  Echocardiogram 2D Echocardiogram has been performed.  [name] 05/27/2020, 5:00 PM

## 2020-05-27 NOTE — ED Triage Notes (Signed)
PT BIBA from [facility] for a Code STROKE. Pt was rounded on at 7am and appeared to be at baseline health. When physical therapy assessed the patient at [facility] they noticed her to have a left sided facial droop and left arm weakness. Pt has hx of CVA. Baseline dementia. Pt has no facial droop assessed in ER. Left arm does not appear weak and patient is able to fully lift both the left leg and left arm with no assistance.

## 2020-05-28 ENCOUNTER — Inpatient Hospital Stay (HOSPITAL_COMMUNITY): Payer: Medicare Other

## 2020-05-28 DIAGNOSIS — Z90722 Acquired absence of ovaries, bilateral: Secondary | ICD-10-CM | POA: Diagnosis not present

## 2020-05-28 DIAGNOSIS — N1832 Chronic kidney disease, stage 3b: Secondary | ICD-10-CM | POA: Diagnosis present

## 2020-05-28 DIAGNOSIS — Z20822 Contact with and (suspected) exposure to covid-19: Secondary | ICD-10-CM | POA: Diagnosis present

## 2020-05-28 DIAGNOSIS — R29810 Facial weakness: Secondary | ICD-10-CM | POA: Diagnosis present

## 2020-05-28 DIAGNOSIS — Z85038 Personal history of other malignant neoplasm of large intestine: Secondary | ICD-10-CM | POA: Diagnosis not present

## 2020-05-28 DIAGNOSIS — Z8673 Personal history of transient ischemic attack (TIA), and cerebral infarction without residual deficits: Secondary | ICD-10-CM | POA: Diagnosis not present

## 2020-05-28 DIAGNOSIS — Z86718 Personal history of other venous thrombosis and embolism: Secondary | ICD-10-CM | POA: Diagnosis not present

## 2020-05-28 DIAGNOSIS — Z7401 Bed confinement status: Secondary | ICD-10-CM | POA: Diagnosis not present

## 2020-05-28 DIAGNOSIS — D696 Thrombocytopenia, unspecified: Secondary | ICD-10-CM | POA: Diagnosis not present

## 2020-05-28 DIAGNOSIS — K5900 Constipation, unspecified: Secondary | ICD-10-CM | POA: Diagnosis not present

## 2020-05-28 DIAGNOSIS — Z86711 Personal history of pulmonary embolism: Secondary | ICD-10-CM | POA: Diagnosis not present

## 2020-05-28 DIAGNOSIS — Z96642 Presence of left artificial hip joint: Secondary | ICD-10-CM | POA: Diagnosis present

## 2020-05-28 DIAGNOSIS — G8194 Hemiplegia, unspecified affecting left nondominant side: Secondary | ICD-10-CM | POA: Diagnosis present

## 2020-05-28 DIAGNOSIS — G459 Transient cerebral ischemic attack, unspecified: Secondary | ICD-10-CM | POA: Diagnosis not present

## 2020-05-28 DIAGNOSIS — I6523 Occlusion and stenosis of bilateral carotid arteries: Secondary | ICD-10-CM | POA: Diagnosis not present

## 2020-05-28 DIAGNOSIS — F32A Depression, unspecified: Secondary | ICD-10-CM | POA: Diagnosis present

## 2020-05-28 DIAGNOSIS — R531 Weakness: Secondary | ICD-10-CM | POA: Diagnosis not present

## 2020-05-28 DIAGNOSIS — I672 Cerebral atherosclerosis: Secondary | ICD-10-CM | POA: Diagnosis not present

## 2020-05-28 DIAGNOSIS — Z90710 Acquired absence of both cervix and uterus: Secondary | ICD-10-CM | POA: Diagnosis not present

## 2020-05-28 DIAGNOSIS — I639 Cerebral infarction, unspecified: Secondary | ICD-10-CM | POA: Diagnosis present

## 2020-05-28 DIAGNOSIS — I708 Atherosclerosis of other arteries: Secondary | ICD-10-CM | POA: Diagnosis not present

## 2020-05-28 DIAGNOSIS — Z7901 Long term (current) use of anticoagulants: Secondary | ICD-10-CM | POA: Diagnosis not present

## 2020-05-28 DIAGNOSIS — R0902 Hypoxemia: Secondary | ICD-10-CM | POA: Diagnosis not present

## 2020-05-28 DIAGNOSIS — I2510 Atherosclerotic heart disease of native coronary artery without angina pectoris: Secondary | ICD-10-CM | POA: Diagnosis present

## 2020-05-28 DIAGNOSIS — M2550 Pain in unspecified joint: Secondary | ICD-10-CM | POA: Diagnosis not present

## 2020-05-28 DIAGNOSIS — J449 Chronic obstructive pulmonary disease, unspecified: Secondary | ICD-10-CM | POA: Diagnosis present

## 2020-05-28 DIAGNOSIS — F0390 Unspecified dementia without behavioral disturbance: Secondary | ICD-10-CM | POA: Diagnosis present

## 2020-05-28 DIAGNOSIS — Z66 Do not resuscitate: Secondary | ICD-10-CM | POA: Diagnosis present

## 2020-05-28 DIAGNOSIS — R8271 Bacteriuria: Secondary | ICD-10-CM | POA: Diagnosis present

## 2020-05-28 DIAGNOSIS — Z8616 Personal history of COVID-19: Secondary | ICD-10-CM | POA: Diagnosis not present

## 2020-05-28 DIAGNOSIS — I129 Hypertensive chronic kidney disease with stage 1 through stage 4 chronic kidney disease, or unspecified chronic kidney disease: Secondary | ICD-10-CM | POA: Diagnosis present

## 2020-05-28 DIAGNOSIS — R402411 Glasgow coma scale score 13-15, in the field [EMT or ambulance]: Secondary | ICD-10-CM | POA: Diagnosis not present

## 2020-05-28 DIAGNOSIS — I10 Essential (primary) hypertension: Secondary | ICD-10-CM | POA: Diagnosis not present

## 2020-05-28 DIAGNOSIS — I4819 Other persistent atrial fibrillation: Secondary | ICD-10-CM | POA: Diagnosis present

## 2020-05-28 DIAGNOSIS — I444 Left anterior fascicular block: Secondary | ICD-10-CM | POA: Diagnosis present

## 2020-05-28 MED ORDER — SODIUM CHLORIDE 0.9 % IV SOLN
1.0000 g | INTRAVENOUS | Status: DC
  Filled 2020-05-28: qty 10

## 2020-05-28 MED ORDER — CEPHALEXIN 250 MG PO CAPS
250.0000 mg | ORAL_CAPSULE | Freq: Two times a day (BID) | ORAL | Status: DC
  Administered 2020-05-28 – 2020-05-29 (×3): 250 mg via ORAL
  Filled 2020-05-28 (×4): qty 1

## 2020-05-28 MED ORDER — IPRATROPIUM-ALBUTEROL 0.5-2.5 (3) MG/3ML IN SOLN: 3.0000 mL | RESPIRATORY_TRACT | Status: DC | PRN

## 2020-05-28 MED ORDER — WARFARIN SODIUM 2 MG PO TABS
2.0000 mg | ORAL_TABLET | Freq: Once | ORAL | Status: AC
  Administered 2020-05-28: 2 mg via ORAL
  Filled 2020-05-28: qty 1

## 2020-05-28 MED ORDER — POLYETHYLENE GLYCOL 3350 17 G PO PACK
17.0000 g | PACK | Freq: Every day | ORAL | Status: DC
  Administered 2020-05-28: 17 g via ORAL
  Filled 2020-05-28 (×2): qty 1

## 2020-05-28 NOTE — Progress Notes (Signed)
PROGRESS NOTE    [name]  KWI:[id number] DOB: 03/11/1927 DOA: 05/27/2020 PCP: Gayland Curry, DO    Brief Narrative:  Mrs. Rockhold was admitted to the hospital with the working diagnosis of left-sided weakness, suspected transitory ischemic attack/ CVA.  86 year old female with significant past medical history for chronic atrial fibrillation, hypertension, DVT/PE, history of CVA, and colon cancer status post resection.  Patient was found with acute left sided weakness, left facial droop around 7:15 in the morning on the day of admission.  About 2 months ago she had a temporary hold on Coumadin, but considering risk of clotting into ultrasound.  2 weeks ago patient was diagnosed with a asymptomatic COVID-19 infection. Sodium 144, potassium 4.0, chloride 117, bicarb 19, glucose 71, BUN 28, creatinine 0.91, white count 5.8, hemoglobin 12.9, hematocrit 42.3, platelets 94, INR 1.9.  SARS COVID-19 was negative.  Urinalysis more than 50 white cells, 0-5 red cells, specific gravity 1.016, 30 protein, large leukocytes. Drug screen negative. Abdominal films with large amount of stools.  No obstruction. EKG 49 bpm, left axis deviation, left anterior fascicular block, interventricular conduction delay, atrial fibrillation rhythm, no ST segment or T wave changes.   Assessment & Plan:   Principal Problem:   Left-sided weakness Active Problems:   Essential hypertension   Hx pulmonary embolism   Warfarin anticoagulation   TIA (transient ischemic attack)   History of CVA (cerebrovascular accident)   Thrombocytopenia ([place])   Constipation   DNR (do not resuscitate)    1. Acute focal neurologic deficit. This am patient with improved left sided strength, to my examination this am is symmetric bilaterally. Continue to have ambulatory dysfunction. At home she uses a wheelchair and walker.   Unable to perform brain MRI/MRA due to severe kyphosis.  INR subtherapeutic. Follow up with echocardiogram  and carotid US. May need repeat CT head in 24 hr, will follow with neurology recommendations.   Continue neuro checks, aspirin and warfarin.  PT/OT and speech evaluations. She will need 24 hr supervision/ SNF.  Continue statin and follow up on lipid profile.   2. Urinary tract infection. Prior urine culture from 04/25/19 positive for Klebsiella pneumonia sensitive to cephalosporins.  Continue with ceftriaxone IV. Follow up on cultures and cell count. Patient with no frank urinary symptoms, plan for 3 days therapy.  Decrease IV fluids to 50 ml per hr.   3. Chronic atrial fibrillation. Rate controlled, continue anticoagulation with aspirin and warfarin. Patient has declined blood work today.   4. HTN blood pressure is 148/84 mmHg, will continue blood pressure monitoring.   5. DVT/PE. Continue anticoagulation with warfarin, if patient continue to decline blood work, may change to direct oral anticoagulant.   6. Chronic thrombocytopenia. Continue close follow up, no signs of acute bleeding.   7. Hx of colon cancer/ constipation. Add bid miralax for bowel regimen, patient with no nausea or vomiting, this am with no abdominal pain.   8. Severe protein calorie malnutrition.  Continue with nutritional supplements.   9. Depression. Continue with escitalopram and quetiapine.    10.COPD. No signs of exacerbation. Continue with bronchodilator therapy and inhaled corticosteroids.   Patient continue to be at high risk for recurrent neurologic deficit.   Status is: Observation  The patient will require care spanning > 2 midnights and should be moved to inpatient because: IV treatments appropriate due to intensity of illness or inability to take PO  Dispo: The patient is from: Home  Anticipated d/c is to: SNF              Anticipated d/c date is: 2 days              Patient currently is not medically stable to d/c.  DVT prophylaxis: Warfarin   Code Status:   DNR  Family  Communication: I spoke over the phone with the patient's daughter about patient's  condition, plan of care, prognosis and all questions were addressed.      Nutrition Status:           Skin Documentation: Pressure Injury 04/08/19 Buttocks (Active)  04/08/19 0821  Location: Buttocks  Location Orientation:   Staging:   Wound Description (Comments):   Present on Admission:      Consultants:   Neurology   Procedures:     Antimicrobials:   Ceftriaxone     Subjective: Patient continue to be very weak and deconditioned, not yet back to baseline, no nausea or vomiting, her left sided weakness seems to be improving.   Objective: Vitals:   05/27/20 1800 05/27/20 2000 05/27/20 2051 05/28/20 0502  BP: (!) 152/53 (!) 146/86 (!) 140/92 (!) 148/84  Pulse: 76 84 85 73  Resp: 19 15 18    Temp:  97.8 F (36.6 C) 98.2 F (36.8 C) 97.8 F (36.6 C)  TempSrc:  Axillary Oral Axillary  SpO2: 93% 98% 96% 96%  Weight:   62.9 kg     Intake/Output Summary (Last 24 hours) at 05/28/2020 1237 Last data filed at 05/28/2020 0302 Gross per 24 hour  Intake 237 ml  Output 225 ml  Net 12 ml   Filed Weights   05/27/20 0800 05/27/20 2051  Weight: 64.8 kg 62.9 kg    Examination:   General: Not in pain or dyspnea, deconditioned and ill looking appearing  Neurology: Awake and alert, strength upper extremities 4/5 bilaterally, lower extremities 4/5 distal.  E ENT: mild pallor, no icterus, oral mucosa dry Cardiovascular: No JVD. S1-S2 present, rhythmic, no gallops, rubs, or murmurs. No lower extremity edema. Pulmonary: positive breath sounds bilaterally, with wheezing, rhonchi or rales. Gastrointestinal. Abdomen soft and non tender Skin. No rashes Musculoskeletal: no joint deformities     Data Reviewed: I have personally reviewed following labs and imaging studies  CBC: Recent Labs  Lab 05/27/20 0846 05/27/20 0912  WBC  --  5.8  NEUTROABS  --  4.1  HGB 11.2* 12.9  HCT  33.0* 42.3  MCV  --  96.6  PLT  --  94*   Basic Metabolic Panel: Recent Labs  Lab 05/27/20 0846 05/27/20 0847  NA 147* 144  K 3.9 4.0  CL 115* 117*  CO2  --  19*  GLUCOSE 69* 71  BUN 34* 28*  CREATININE 0.80 0.91  CALCIUM  --  7.2*   GFR: Estimated Creatinine Clearance: 36 mL/min (by C-G formula based on SCr of 0.91 mg/dL). Liver Function Tests: Recent Labs  Lab 05/27/20 0847  AST 21  ALT 10  ALKPHOS 44  BILITOT 0.8  PROT 4.8*  ALBUMIN 2.6*   No results for input(s): LIPASE, AMYLASE in the last 168 hours. No results for input(s): AMMONIA in the last 168 hours. Coagulation Profile: Recent Labs  Lab 05/27/20 0912  INR 1.9*   Cardiac Enzymes: No results for input(s): CKTOTAL, CKMB, CKMBINDEX, TROPONINI in the last 168 hours. BNP (last 3 results) No results for input(s): PROBNP in the last 8760 hours. HbA1C: Recent Labs    05/27/20 0912  HGBA1C 5.6   CBG: Recent Labs  Lab 05/27/20 0839  GLUCAP 82   Lipid Profile: Recent Labs    05/27/20 0912  CHOL 95  HDL 32*  LDLCALC 55  TRIG 39  CHOLHDL 3.0   Thyroid Function Tests: No results for input(s): TSH, T4TOTAL, FREET4, T3FREE, THYROIDAB in the last 72 hours. Anemia Panel: No results for input(s): VITAMINB12, FOLATE, FERRITIN, TIBC, IRON, RETICCTPCT in the last 72 hours.    Radiology Studies: I have reviewed all of the imaging during this hospital visit personally     Scheduled Meds: . aspirin  81 mg Oral Daily  . atorvastatin  20 mg Oral Daily  . calcium-vitamin D  1 tablet Oral Daily  . dextrose  1 ampule Intravenous Once  . escitalopram  10 mg Oral Daily  . feeding supplement  237 mL Oral TID BM  . feeding supplement  237 mL Oral BID BM  . guaiFENesin  600 mg Oral BID  . loratadine  10 mg Oral Daily  . mometasone-formoterol  2 puff Inhalation BID  . multivitamin with minerals  1 tablet Oral Daily  . QUEtiapine  50 mg Oral QHS  . senna-docusate  1 tablet Oral BID  . tamsulosin  0.4  mg Oral Daily  . Warfarin - Pharmacist Dosing Inpatient   Does not apply q1600   Continuous Infusions: . sodium chloride Stopped (05/27/20 2338)  . cefTRIAXone (ROCEPHIN)  IV       LOS: 0 days        [name] Gerome Apley, MD

## 2020-05-28 NOTE — Progress Notes (Signed)
ANTICOAGULATION CONSULT NOTE  Pharmacy Consult:  Coumadin Indication: atrial fibrillation  No Known Allergies  Patient Measurements: Weight: 62.9 kg (138 lb 10.7 oz)  Vital Signs: Temp: 97.7 F (36.5 C) (11/23 1406) Temp Source: Axillary (11/23 0502) BP: 149/78 (11/23 1406) Pulse Rate: 74 (11/23 1406)  Labs: Recent Labs    05/27/20 0846 05/27/20 0847 05/27/20 0912  HGB 11.2*  --  12.9  HCT 33.0*  --  42.3  PLT  --   --  94*  APTT  --   --  34  LABPROT  --   --  20.9*  INR  --   --  1.9*  CREATININE 0.80 0.91  --     Estimated Creatinine Clearance: 36 mL/min (by C-G formula based on SCr of 0.91 mg/dL).   Assessment: 59 YOF presenting as code stroke with left sided weakness.  Patient was on Coumadin 2mg  PO daily PTA for history of Afib, continued inpatient with resolved stroke symptoms.   Patient declined blood draw today.  INR was 1.9 yesterday.  No bleeding reported.  Goal of Therapy:  INR 2-3 Monitor platelets by anticoagulation protocol: Yes   Plan:  Coumadin 2mg  PO today Daily PT / INR  Severus Brodzinski D. Mina Marble, PharmD, BCPS, [place] 05/28/2020, 2:37 PM

## 2020-05-28 NOTE — Evaluation (Signed)
Physical Therapy Evaluation Patient Details Name: [name] MRN: [id number] DOB: 1927-04-25 Today's Date: 05/28/2020   History of Present Illness  Pt is a 86 y/o F presenting to the ED on 11/22 after PT at the pts SNF noticing L arm weakness and facial droop. PMH includes dementia, CVA, CKD stage III, asthma, back pain, HTN, chronic anticoagulation, DVT, and colon cancer s/p resection.  Clinical Impression  Pt was resistant to therapy today until she realized that her bed was wet. Pt was agreeable to perform bed mobility in order to get herself cleaned up and have HOB elevated so that she could drink coffee. Pt states that she gets into w/c at baseline but was unwilling to participate enough to show PT. Pt would benefit from continued acute therapy in order to show transfer ability to w/c and assess baseline. Pt is appropriate to return to SNF based on PLOF at d/c.    Follow Up Recommendations Supervision/Assistance - 24 hour;SNF    Equipment Recommendations  Hospital bed;Wheelchair (measurements PT);Wheelchair cushion (measurements PT)    Recommendations for Other Services       Precautions / Restrictions Precautions Precautions: Fall Precaution Comments: kyphotic Restrictions Weight Bearing Restrictions: No      Mobility  Bed Mobility Overal bed mobility: Needs Assistance Bed Mobility: Rolling Rolling: Min assist         General bed mobility comments: pt rolled L and R in order to change bed pad; pt was min assist but needed motivation; pt was max +2 to scoot towards HOB in trendelenburg    Transfers                 General transfer comment: pt declined  Ambulation/Gait             General Gait Details: not attempted  Stairs            Wheelchair Mobility    Modified Rankin (Stroke Patients Only)       Balance Overall balance assessment:  (not assessed, pt declined sitting EOB)                                            Pertinent Vitals/Pain Pain Assessment: Faces Faces Pain Scale: Hurts a little bit Pain Location: back Pain Intervention(s): Monitored during session    Home Living Family/patient expects to be discharged to:: Skilled nursing facility                 Additional Comments: pt states that SNF picks her up and puts her in a w/c everyday, doesn't go to the dining hall to eat but feeds herself.    Prior Function Level of Independence: Needs assistance   Gait / Transfers Assistance Needed: pt states SNF picks her up and puts her in a w/c everday; pt doesn't go to the dining hall but states she is able to feed herself  ADL's / Homemaking Assistance Needed: pt states she is able to wash her face, but needs help with dressing and going to the bathroom        Hand Dominance       Extremity/Trunk Assessment   Upper Extremity Assessment Upper Extremity Assessment: Overall WFL for tasks assessed  Upper Extremity Comments: WFL to grab onto bed rails to roll     Lower Extremity Assessment Lower Extremity Assessment: at least 2/5 with rolling, further assessment  later    Cervical / Trunk Assessment Cervical / Trunk Assessment: Kyphotic  Communication   Communication: HOH  Cognition Arousal/Alertness: Awake/alert (pt trying to ignore therapist initially and act like she was sleeping) Behavior During Therapy: Flat affect Overall Cognitive Status: No family/caregiver present to determine baseline cognitive functioning                                 General Comments: pt has dementia at baseline, but family/caregiver not here to determine cognitive status      General Comments General comments (skin integrity, edema, etc.):    Exercises     Assessment/Plan    PT Assessment Patient needs continued PT services  PT Problem List Decreased strength;Decreased cognition;Decreased activity tolerance;Pain;Decreased mobility;Decreased balance       PT  Treatment Interventions Balance training;Cognitive remediation;Functional mobility training;Patient/family education;Therapeutic activities;Therapeutic exercise    PT Goals (Current goals can be found in the Care Plan section)  Acute Rehab PT Goals Patient Stated Goal: wouldn't state Time For Goal Achievement: 06/11/20 Potential to Achieve Goals: Fair    Frequency Min 2X/week   Barriers to discharge        Co-evaluation               AM-PAC PT "6 Clicks" Mobility  Outcome Measure Help needed turning from your back to your side while in a flat bed without using bedrails?: A Lot Help needed moving from lying on your back to sitting on the side of a flat bed without using bedrails?: Total Help needed moving to and from a bed to a chair (including a wheelchair)?: Total Help needed standing up from a chair using your arms (e.g., wheelchair or bedside chair)?: Total Help needed to walk in hospital room?: Total Help needed climbing 3-5 steps with a railing? : Total 6 Click Score: 7    End of Session Equipment Utilized During Treatment: Gait belt Activity Tolerance: Patient tolerated treatment well Patient left: in bed;with call bell/phone within reach;with bed alarm set Nurse Communication: Mobility status PT Visit Diagnosis: Muscle weakness (generalized) (M62.81);Unsteadiness on feet (R26.81);Difficulty in walking, not elsewhere classified (R26.2)    Time: 7096-2836 PT Time Calculation (min) (ACUTE ONLY): 24 min   Charges:   PT Evaluation $PT Eval Moderate Complexity: 1 Mod PT Treatments $Therapeutic Activity: 8-22 mins        Caleb Popp, SPT [id number]  Makyi Ledo 05/28/2020, 10:55 AM

## 2020-05-28 NOTE — Progress Notes (Signed)
STROKE TEAM PROGRESS NOTE   INTERVAL HISTORY I personally reviewed history of presenting illness with the patient, electronic medical records and available imaging films in PACS. She presented with transient left hemiparesis which has improved.  She was on warfarin prior to admission for A. fib with INR was slightly suboptimal at 1.9 on admission.  CT scan shows no acute abnormality but does show an old left basal ganglia infarct.  She has history of A. fib and has dementia.  Carotid ultrasound shows no significant extracranial stenosis.  Echocardiogram is unremarkable.  LDL cholesterol 55 mg percent.  Hemoglobin A1c is 5.6. Vitals:   05/27/20 1800 05/27/20 2000 05/27/20 2051 05/28/20 0502  BP: (!) 152/53 (!) 146/86 (!) 140/92 (!) 148/84  Pulse: 76 84 85 73  Resp: 19 15 18    Temp:  97.8 F (36.6 C) 98.2 F (36.8 C) 97.8 F (36.6 C)  TempSrc:  Axillary Oral Axillary  SpO2: 93% 98% 96% 96%  Weight:   62.9 kg    CBC:  Recent Labs  Lab 05/27/20 0846 05/27/20 0912  WBC  --  5.8  NEUTROABS  --  4.1  HGB 11.2* 12.9  HCT 33.0* 42.3  MCV  --  96.6  PLT  --  94*   Basic Metabolic Panel:  Recent Labs  Lab 05/27/20 0846 05/27/20 0847  NA 147* 144  K 3.9 4.0  CL 115* 117*  CO2  --  19*  GLUCOSE 69* 71  BUN 34* 28*  CREATININE 0.80 0.91  CALCIUM  --  7.2*   Lipid Panel:  Recent Labs  Lab 05/27/20 0912  CHOL 95  TRIG 39  HDL 32*  CHOLHDL 3.0  VLDL 8  LDLCALC 55   HgbA1c:  Recent Labs  Lab 05/27/20 0912  HGBA1C 5.6   Urine Drug Screen:  Recent Labs  Lab 05/27/20 1729  LABOPIA NONE DETECTED  COCAINSCRNUR NONE DETECTED  LABBENZ NONE DETECTED  AMPHETMU NONE DETECTED  THCU NONE DETECTED  LABBARB NONE DETECTED    Alcohol Level  Recent Labs  Lab 05/27/20 0848  ETH <10    IMAGING past 24 hours DG Abd 1 View  Result Date: 05/27/2020 CLINICAL DATA:  Abdominal pain and nausea for 3 days. EXAM: ABDOMEN - 1 VIEW COMPARISON:  Chest and two views abdomen  06/20/2010. FINDINGS: There is a very large volume of stool throughout the colon. The bowel gas pattern is nonobstructive. No unexpected abdominal calcification is seen. IVC filter and left hip replacement noted. The patient has convex left lumbar scoliosis. Bones are osteopenic. IMPRESSION: No acute finding. Large volume of stool throughout the colon. Electronically Signed   By: Inge Rise M.D.   On: 05/27/2020 14:32   ECHOCARDIOGRAM COMPLETE  Result Date: 05/27/2020    ECHOCARDIOGRAM REPORT   Patient Name:   LANIJAH WARZECHA Date of Exam: 05/27/2020 Medical Rec #:  [id number]     Height:       63.0 in Accession #:    [id number]    Weight:       142.9 lb Date of Birth:  1926/10/17     BSA:          1.676 m Patient Age:    51 years      BP:           126/71 mmHg Patient Gender: F             HR:           72 bpm.  Exam Location:  Inpatient Procedure: 2D Echo, Cardiac Doppler and Color Doppler Indications:    TIA  History:        Patient has prior history of Echocardiogram examinations.                 Arrythmias:Atrial Fibrillation. H/O DVT and pulmonary embolus.                 S/P IVC filter. CKD.  Sonographer:    Clayton Lefort RDCS (AE) Referring Phys: [id number] RONDELL A SMITH IMPRESSIONS  1. Left ventricular ejection fraction, by estimation, is 60 to 65%. The left ventricle has normal function. The left ventricle has no regional wall motion abnormalities. There is severe left ventricular hypertrophy. Left ventricular diastolic function could not be evaluated.  2. Right ventricular systolic function is low normal. The right ventricular size is normal. There is moderately elevated pulmonary artery systolic pressure. The estimated right ventricular systolic pressure is 71.0 mmHg.  3. Left atrial size was severely dilated.  4. Right atrial size was severely dilated.  5. The mitral valve is abnormal. Mild to moderate mitral valve regurgitation.  6. Tricuspid valve regurgitation is moderate to severe.  7. The aortic  valve is abnormal. Aortic valve regurgitation is trivial. Mild to moderate aortic valve sclerosis/calcification is present, without any evidence of aortic stenosis.  8. The inferior vena cava is normal in size with <50% respiratory variability, suggesting right atrial pressure of 8 mmHg. Comparison(s): No prior Echocardiogram. FINDINGS  Left Ventricle: Left ventricular ejection fraction, by estimation, is 60 to 65%. The left ventricle has normal function. The left ventricle has no regional wall motion abnormalities. The left ventricular internal cavity size was normal in size. There is  severe left ventricular hypertrophy. Left ventricular diastolic function could not be evaluated due to atrial fibrillation. Left ventricular diastolic function could not be evaluated. Right Ventricle: The right ventricular size is normal. No increase in right ventricular wall thickness. Right ventricular systolic function is low normal. There is moderately elevated pulmonary artery systolic pressure. The tricuspid regurgitant velocity  is 3.56 m/s, and with an assumed right atrial pressure of 8 mmHg, the estimated right ventricular systolic pressure is 62.6 mmHg. Left Atrium: Left atrial size was severely dilated. Right Atrium: Right atrial size was severely dilated. Pericardium: There is no evidence of pericardial effusion. Mitral Valve: The mitral valve is abnormal. Mild to moderate mitral annular calcification. Mild to moderate mitral valve regurgitation. Tricuspid Valve: The tricuspid valve is grossly normal. Tricuspid valve regurgitation is moderate to severe. Aortic Valve: The aortic valve is abnormal. Aortic valve regurgitation is trivial. Mild to moderate aortic valve sclerosis/calcification is present, without any evidence of aortic stenosis. Aortic valve mean gradient measures 5.0 mmHg. Aortic valve peak gradient measures 10.0 mmHg. Aortic valve area, by VTI measures 1.34 cm. Pulmonic Valve: The pulmonic valve was normal  in structure. Pulmonic valve regurgitation is not visualized. Aorta: The aortic root and ascending aorta are structurally normal, with no evidence of dilitation. Venous: The inferior vena cava is normal in size with less than 50% respiratory variability, suggesting right atrial pressure of 8 mmHg. IAS/Shunts: No atrial level shunt detected by color flow Doppler.  LEFT VENTRICLE PLAX 2D LVIDd:         4.50 cm LVIDs:         3.00 cm LV PW:         1.70 cm LV IVS:        1.70 cm LVOT diam:  1.80 cm LV SV:         45 LV SV Index:   27 LVOT Area:     2.54 cm  RIGHT VENTRICLE            IVC RV Basal diam:  3.30 cm    IVC diam: 1.70 cm RV S prime:     9.57 cm/s TAPSE (M-mode): 1.8 cm LEFT ATRIUM              Index       RIGHT ATRIUM           Index LA diam:        4.90 cm  2.92 cm/m  RA Area:     26.70 cm LA Vol (A2C):   143.0 ml 85.32 ml/m RA Volume:   84.40 ml  50.36 ml/m LA Vol (A4C):   124.0 ml 73.99 ml/m LA Biplane Vol: 135.0 ml 80.55 ml/m  AORTIC VALVE AV Area (Vmax):    1.22 cm AV Area (Vmean):   1.25 cm AV Area (VTI):     1.34 cm AV Vmax:           158.00 cm/s AV Vmean:          108.000 cm/s AV VTI:            0.333 m AV Peak Grad:      10.0 mmHg AV Mean Grad:      5.0 mmHg LVOT Vmax:         76.00 cm/s LVOT Vmean:        53.200 cm/s LVOT VTI:          0.176 m LVOT/AV VTI ratio: 0.53  AORTA Ao Root diam: 3.10 cm Ao Asc diam:  2.70 cm TRICUSPID VALVE TR Peak grad:   50.7 mmHg TR Vmax:        356.00 cm/s  SHUNTS Systemic VTI:  0.18 m Systemic Diam: 1.80 cm Lyman Bishop MD Electronically signed by Lyman Bishop MD Signature Date/Time: 05/27/2020/5:12:51 PM    Final    CT HEAD CODE STROKE WO CONTRAST  Result Date: 05/27/2020 CLINICAL DATA:  Code stroke.  Neuro deficit, acute, stroke suspected EXAM: CT HEAD WITHOUT CONTRAST TECHNIQUE: Contiguous axial images were obtained from the base of the skull through the vertex without intravenous contrast. COMPARISON:  06/01/2018 head CT and prior. FINDINGS:  Please note image quality is mildly degraded by motion artifact. Brain: No intracranial hemorrhage. Small left basal ganglia hypodensity, new since prior exam (6:15). Left posterior cranial fossa arachnoid cyst, unchanged. No midline shift, ventriculomegaly or extra-axial fluid collection. Diffuse cerebral volume loss with ex vacuo dilatation. Confluent supratentorial white matter hypodensities are unchanged. Chronic right basal ganglia lacunar insult versus dilated perivascular space is unchanged. Vascular: No hyperdense vessel. Bilateral skull base atherosclerotic calcifications. Skull: Negative for fracture or focal lesion. Sinuses/Orbits: No acute orbital finding. Mild pansinus mucosal thickening. Other: None. ASPECTS [facility] Stroke Program Early CT Score) - Ganglionic level infarction (caudate, lentiform nuclei, internal capsule, insula, M1-M3 cortex): 6 - Supraganglionic infarction (M4-M6 cortex): 3 Total score (0-10 with 10 being normal): 9 IMPRESSION: 1. Small age indeterminate left basal ganglia hypodensity may reflect sequela of prior insult versus dilated perivascular space. 2. Chronic right basal gangliar lacunar insult. 3. Age-related cerebral atrophy and chronic microvascular ischemic changes. 4. ASPECTS is 9 Code stroke imaging results were communicated on 05/27/2020 at 9:11 am to provider Dr. Lorrin Goodell via secure text paging. Electronically Signed   By: Milus Mallick.D.  On: 05/27/2020 09:15   VAS US CAROTID (at [facility] and [facility] only)  Result Date: 05/27/2020 Carotid Arterial Duplex Study Indications:       TIA. Risk Factors:      Hypertension, prior CVA. Comparison Study:  no prior Performing Technologist: Abram Sander RVS  Examination Guidelines: A complete evaluation includes B-mode imaging, spectral Doppler, color Doppler, and power Doppler as needed of all accessible portions of each vessel. Bilateral testing is considered an integral part of a complete examination. Limited examinations  for reoccurring indications may be performed as noted.  Right Carotid Findings: +----------+--------+--------+--------+------------------+--------+           PSV cm/sEDV cm/sStenosisPlaque DescriptionComments +----------+--------+--------+--------+------------------+--------+ CCA Prox  57      11              heterogenous               +----------+--------+--------+--------+------------------+--------+ CCA Distal52      11              heterogenous               +----------+--------+--------+--------+------------------+--------+ ICA Prox  62      11      1-39%   heterogenous               +----------+--------+--------+--------+------------------+--------+ ICA Distal35      6                                          +----------+--------+--------+--------+------------------+--------+ ECA       99                                                 +----------+--------+--------+--------+------------------+--------+ +----------+--------+-------+--------+-------------------+           PSV cm/sEDV cmsDescribeArm Pressure (mmHG) +----------+--------+-------+--------+-------------------+ [id number]                                         +----------+--------+-------+--------+-------------------+ +---------+--------+--+--------+-+---------+ VertebralPSV cm/s32EDV cm/s5Antegrade +---------+--------+--+--------+-+---------+  Left Carotid Findings: +----------+--------+--------+--------+------------------+--------+           PSV cm/sEDV cm/sStenosisPlaque DescriptionComments +----------+--------+--------+--------+------------------+--------+ CCA Prox  67      8               heterogenous               +----------+--------+--------+--------+------------------+--------+ CCA Distal53      13              heterogenous               +----------+--------+--------+--------+------------------+--------+ ICA Prox  64      10      1-39%   heterogenous                +----------+--------+--------+--------+------------------+--------+ ICA Distal44      9                                          +----------+--------+--------+--------+------------------+--------+ ECA       85                                                 +----------+--------+--------+--------+------------------+--------+ +----------+--------+--------+--------+-------------------+  PSV cm/sEDV cm/sDescribeArm Pressure (mmHG) +----------+--------+--------+--------+-------------------+ [id number]                                          +----------+--------+--------+--------+-------------------+ +---------+--------+--+--------+-+---------+ VertebralPSV cm/s40EDV cm/s7Antegrade +---------+--------+--+--------+-+---------+   Summary:   *See table(s) above for measurements and observations.  Electronically signed by Monica Martinez MD on 05/27/2020 at 4:52:39 PM.    Final     PHYSICAL EXAM Pleasant elderly lady not in distress.  She is well-appearing. . Afebrile. Head is nontraumatic. Neck is supple without bruit.    Cardiac exam no murmur or gallop. Lungs are clear to auscultation. Distal pulses are well felt. Neurological Exam : She is awake alert she is oriented to place and person.  She is slightly diminished attention, registration and recall.  She follows simple midline and one-step commands.  Extraocular movements appear full range without nystagmus.  She blinks to threat bilaterally.  Face is symmetric without weakness.  Tongue is midline.  Motor system exam she is able to move all 4 extremities equally well against gravity but not particularly cooperative for detailed muscle testing but I did not see any focal weakness.  Sensation intact in all 4 extremities.  Plantars are downgoing.  Gait not tested. ASSESSMENT/PLAN Ms. ONETHA GAFFEY is a 86 y.o. female with history of AF on warfarin, HTN, DVT/PE s/p filter, prior stroke, colon cancer s/p  resection, CKD3, seizures and dementia presenting with L sided weakness w/ dysarthria w/ complete resolution.   R brain TIA vs small infarct not visualized on CT.  Likely from underlying A. fib on anticoagulation with slightly suboptimal INR on warfarin  Code Stroke CT head small L basal ganglia hypodensity. Old R basal ganglia lacune. Small vessel disease. Atrophy. ASPECTS 9    MRI  Unable d/t severe kyphosis  Repeat CT at 24h pending   Carotid Doppler no significant extracranial stenosis bilaterally  2D Echo EF 60-65%. No source of embolus. LA & RA severely dilated  LDL 55  HgbA1c 5.6  VTE prophylaxis - warfarin  aspirin 81 mg daily and warfarin daily prior to admission, now on aspirin 81 mg daily and warfarin daily.   Therapy recommendations:  SNF  Disposition:  pending   Atrial Fibrillation Hx PE/DVT  Home anticoagulation:  warfarin daily continued in the hospital  Admission INR 1.9 . Continue warfarin daily at discharge   Hypertension  Stable . Permissive hypertension (OK if < 220/120) but gradually normalize in 5-7 days . Long-term BP goal normotensive  Hyperlipidemia  Home meds:  lipitor 20, resumed in hospital  LDL 55, goal < 70    Continue statin at discharge  Other Stroke Risk Factors  Advanced Age >/= 44   Passive Cigarette smoker  Hx stroke/TIA  Other Active Problems  Baseline dementia  UTI  Chronic thrombocytopenia   Severe protein calorie malnutrition  Depression  COPD  Hx colon cancer s/p resection  Hospital day # 0 Recommend repeat CT scan of the head in 24 hours as patient is unable to obtain an MRI due to severe kyphosis.  Continue warfarin with target INR range between 2 and 3.  Continue ongoing stroke work-up and aggressive risk factor modification.  No family available for discussion.  Discussed with Dr. Cathlean Sauer.  Greater than 50% time during this 35-minute visit was spent on counseling and coordination of care and  discussion with care team. Antony Contras, MD  To contact Stroke Continuity provider, please refer to [email or web address] After hours, contact General Neurology

## 2020-05-28 NOTE — Progress Notes (Signed)
Patient has lost her IV access and she is refusing new IV, will change antibiotic to po and continue to encourage hydration by mouth.

## 2020-05-28 NOTE — Plan of Care (Signed)
  Problem: Consults Goal: RH STROKE PATIENT EDUCATION Description: See Patient Education module for education specifics  Outcome: Not Progressing Goal: Nutrition Consult-if indicated Outcome: Not Progressing Goal: Diabetes Guidelines if Diabetic/Glucose > 140 Description: If diabetic or lab glucose is > 140 mg/dl - Initiate Diabetes/Hyperglycemia Guidelines & Document Interventions  Outcome: Not Progressing   Problem: RH BOWEL ELIMINATION Goal: RH STG MANAGE BOWEL WITH ASSISTANCE Description: STG Manage Bowel with Assistance. Outcome: Not Progressing Goal: RH STG MANAGE BOWEL W/MEDICATION W/ASSISTANCE Description: STG Manage Bowel with Medication with Assistance. Outcome: Not Progressing   Problem: RH BLADDER ELIMINATION Goal: RH STG MANAGE BLADDER WITH ASSISTANCE Description: STG Manage Bladder With Assistance Outcome: Not Progressing   Problem: RH BLADDER ELIMINATION Goal: RH STG MANAGE BLADDER WITH ASSISTANCE Description: STG Manage Bladder With Assistance Outcome: Not Progressing Goal: RH STG MANAGE BLADDER WITH MEDICATION WITH ASSISTANCE Description: STG Manage Bladder With Medication With Assistance. Outcome: Not Progressing Goal: RH STG MANAGE BLADDER WITH EQUIPMENT WITH ASSISTANCE Description: STG Manage Bladder With Equipment With Assistance Outcome: Not Progressing   Problem: RH SKIN INTEGRITY Goal: RH STG SKIN FREE OF INFECTION/BREAKDOWN Outcome: Not Progressing Goal: RH STG MAINTAIN SKIN INTEGRITY WITH ASSISTANCE Description: STG Maintain Skin Integrity With Assistance. Outcome: Not Progressing Goal: RH STG ABLE TO PERFORM INCISION/WOUND CARE W/ASSISTANCE Description: STG Able To Perform Incision/Wound Care With Assistance. Outcome: Not Progressing   Problem: RH SAFETY Goal: RH STG ADHERE TO SAFETY PRECAUTIONS W/ASSISTANCE/DEVICE Description: STG Adhere to Safety Precautions With Assistance/Device. Outcome: Not Progressing Goal: RH STG DECREASED RISK OF  FALL WITH ASSISTANCE Description: STG Decreased Risk of Fall With Assistance. Outcome: Not Progressing   Problem: RH COGNITION-NURSING Goal: RH STG USES MEMORY AIDS/STRATEGIES W/ASSIST TO PROBLEM SOLVE Description: STG Uses Memory Aids/Strategies With Assistance to Problem Solve. Outcome: Not Progressing Goal: RH STG ANTICIPATES NEEDS/CALLS FOR ASSIST W/ASSIST/CUES Description: STG Anticipates Needs/Calls for Assist With Assistance/Cues. Outcome: Not Progressing   Problem: RH PAIN MANAGEMENT Goal: RH STG PAIN MANAGED AT OR BELOW PT'S PAIN GOAL Outcome: Not Progressing   Problem: RH KNOWLEDGE DEFICIT Goal: RH STG INCREASE KNOWLEDGE OF DIABETES Outcome: Not Progressing Goal: RH STG INCREASE KNOWLEDGE OF HYPERTENSION Outcome: Not Progressing Goal: RH STG INCREASE KNOWLEDGE OF DYSPHAGIA/FLUID INTAKE Outcome: Not Progressing Goal: RH STG INCREASE KNOWLEGDE OF HYPERLIPIDEMIA Outcome: Not Progressing Goal: RH STG INCREASE KNOWLEDGE OF STROKE PROPHYLAXIS Outcome: Not Progressing   Problem: RH Vision Goal: RH LTG Vision (Specify) Outcome: Not Progressing

## 2020-05-28 NOTE — Evaluation (Signed)
Occupational Therapy Evaluation Patient Details Name: [name] MRN: [id number] DOB: 1927-06-27 Today's Date: 05/28/2020    History of Present Illness Pt is a 86 y/o F presenting to the ED on 11/22 after PT at the pts SNF noticing L arm weakness and facial droop. PMH includes dementia, CVA, CKD stage III, asthma, back pain, HTN, chronic anticoagulation, DVT, and colon cancer s/p resection.   Clinical Impression   Pt admitted from SNF. She reports at SNF she was requiring +2 assistance to transfer to her w/c, required assistance for all ADL and was able to self feed after setup assistance. Pt received in bed with bilateral hand grasp on coffee cup and gaze toward right. Pt demonstrated right gaze preference, vision was assessed functionally as pt with limited engagement in routine assessment. While eating off plate, pt ate right side and required cues to eat cut toast on left side of plate. Limited assessment of LUE secondary to pt declining and appearing to ignore therapist with routine evaluation of areas. Pt will continue to benefit from skilled OT services to maximize safety and independence with ADL/IADL and functional mobility. Will continue to follow acutely and progress as tolerated. Recommend return to SNF at d/c.      Follow Up Recommendations  SNF    Equipment Recommendations  None recommended by OT    Recommendations for Other Services       Precautions / Restrictions Precautions Precautions: Fall Precaution Comments: very kyphotic Restrictions Weight Bearing Restrictions: No      Mobility Bed Mobility               General bed mobility comments: deferred, pt declined    Transfers                 General transfer comment: pt declined, she reports at baseline staff picks her up and puts her in a w/c requires 2+    Balance                                           ADL either performed or assessed with clinical judgement   ADL  Overall ADL's : Needs assistance/impaired Eating/Feeding: Minimal assistance;Sitting;Bed level Eating/Feeding Details (indicate cue type and reason): Pt required assistance for initial 5 bites, then pt completing after setup assistance.  required assistance with cutting Grooming: Maximal assistance   Upper Body Bathing: Maximal assistance   Lower Body Bathing: Total assistance   Upper Body Dressing : Maximal assistance   Lower Body Dressing: Total assistance     Toilet Transfer Details (indicate cue type and reason): deferred           General ADL Comments: limited to bed level as pt declined OOB mobility agreeable to eat breakfast, appeared to be ignoring therapist initially     Vision   Additional Comments: appeared to have right gaze preference, able to locate plate on left side but ate only right side of food, unsure if due to decreased UE ROM or visual limitations. Limited assessment secondary to cognition. Pt placed cup approriately in cupholder on right side of tray     Perception     Praxis      Pertinent Vitals/Pain Pain Assessment: No/denies pain     Hand Dominance Right   Extremity/Trunk Assessment Upper Extremity Assessment Upper Extremity Assessment: Generalized weakness;LUE deficits/detail (limited shoulder ROM secondary to kyphotic posture) LUE  Deficits / Details: limited functional use of LUE, using for bilateral grasp on cup, limited assessment secondary to cognition   Lower Extremity Assessment Lower Extremity Assessment: Defer to PT evaluation   Cervical / Trunk Assessment Cervical / Trunk Assessment: Kyphotic   Communication Communication Communication: HOH   Cognition Arousal/Alertness: Awake/alert Behavior During Therapy: Flat affect Overall Cognitive Status: History of cognitive impairments - at baseline                                 General Comments: per chart, pt with dementia at baseline;pt appeared to be ignoring  therapist initially, provided music and mod cues to intiate taking a few bites of breakfast. Pt required assistance for initial 5 bites, then pt completing after setup assistance.    General Comments  vss    Exercises     Shoulder Instructions      Home Living Family/patient expects to be discharged to:: Skilled nursing facility                                 Additional Comments: picks her up and puts her in a w/c at SNF, gets in w/c everyday, doesn't go to dining hall, feeds self      Prior Functioning/Environment Level of Independence: Needs assistance  Gait / Transfers Assistance Needed: picks her up and puts her in a w/c at SNF, gets in w/c everyday, doesn't go to dining hall, feeds self ADL's / Homemaking Assistance Needed: assistance with all ADL except self feeding            OT Problem List: Impaired balance (sitting and/or standing);Decreased safety awareness;Decreased cognition;Impaired UE functional use      OT Treatment/Interventions: Self-care/ADL training;Therapeutic exercise;Energy conservation;DME and/or AE instruction;Therapeutic activities;Patient/family education;Balance training    OT Goals(Current goals can be found in the care plan section) Acute Rehab OT Goals Patient Stated Goal: to eat breakfast OT Goal Formulation: With patient Time For Goal Achievement: 06/11/20 Potential to Achieve Goals: Good  OT Frequency: Min 2X/week   Barriers to D/C:            Co-evaluation              AM-PAC OT "6 Clicks" Daily Activity     Outcome Measure Help from another person eating meals?: A Little Help from another person taking care of personal grooming?: A Lot Help from another person toileting, which includes using toliet, bedpan, or urinal?: A Lot Help from another person bathing (including washing, rinsing, drying)?: A Lot Help from another person to put on and taking off regular upper body clothing?: A Lot Help from another person  to put on and taking off regular lower body clothing?: A Lot 6 Click Score: 13   End of Session Nurse Communication: Mobility status  Activity Tolerance: Patient tolerated treatment well;Other (comment) (pt limited due to self limiting behaviors, agreeable to eat ) Patient left: in bed;with call bell/phone within reach;with bed alarm set  OT Visit Diagnosis: Other abnormalities of gait and mobility (R26.89);Muscle weakness (generalized) (M62.81);Other symptoms and signs involving cognitive function                Time: 1194-1740 OT Time Calculation (min): 19 min Charges:  OT General Charges $OT Visit: 1 Visit OT Evaluation $OT Eval Moderate Complexity: Ramireno OTR/L [place] Office: [phone]  Wyn Forster 05/28/2020, 10:10 AM

## 2020-05-28 NOTE — Discharge Instructions (Signed)

## 2020-05-29 DIAGNOSIS — R531 Weakness: Secondary | ICD-10-CM | POA: Diagnosis not present

## 2020-05-29 DIAGNOSIS — G459 Transient cerebral ischemic attack, unspecified: Secondary | ICD-10-CM | POA: Diagnosis not present

## 2020-05-29 LAB — URINE CULTURE: Culture: >=100000 — AB

## 2020-05-29 LAB — BASIC METABOLIC PANEL
Anion gap: 9 (ref 5–15)
BUN: 24 mg/dL — ABNORMAL HIGH (ref 8–23)
CO2: 22 mmol/L (ref 22–32)
Calcium: 8.6 mg/dL — ABNORMAL LOW (ref 8.9–10.3)
Chloride: 112 mmol/L — ABNORMAL HIGH (ref 98–111)
Creatinine, Ser: 0.84 mg/dL (ref 0.44–1.00)
GFR, Estimated: >60 mL/min (ref >60)
Glucose, Bld: 89 mg/dL (ref 70–99)
Potassium: 4.4 mmol/L (ref 3.5–5.1)
Sodium: 143 mmol/L (ref 135–145)

## 2020-05-29 LAB — PROTIME-INR
INR: 2.1 — ABNORMAL HIGH (ref 0.8–1.2)
Prothrombin Time: 22.7 seconds — ABNORMAL HIGH (ref 11.4–15.2)

## 2020-05-29 LAB — SARS CORONAVIRUS 2 BY RT PCR (HOSPITAL ORDER, PERFORMED IN ~~LOC~~ HOSPITAL LAB): SARS Coronavirus 2: NEGATIVE

## 2020-05-29 MED ORDER — WARFARIN SODIUM 2 MG PO TABS
2.0000 mg | ORAL_TABLET | Freq: Once | ORAL | Status: AC
  Administered 2020-05-29: 2 mg via ORAL
  Filled 2020-05-29: qty 1

## 2020-05-29 MED ORDER — CEPHALEXIN 250 MG PO CAPS: 250.0000 mg | ORAL_CAPSULE | Freq: Two times a day (BID) | ORAL | 0 refills | Status: DC

## 2020-05-29 NOTE — Progress Notes (Signed)
ANTICOAGULATION CONSULT NOTE  Pharmacy Consult:  Coumadin Indication: atrial fibrillation  No Known Allergies  Patient Measurements: Weight: 62.9 kg (138 lb 10.7 oz)  Vital Signs: Temp: 97.4 F (36.3 C) (11/24 0415) Temp Source: Axillary (11/24 0415) BP: 148/76 (11/24 0415) Pulse Rate: 81 (11/24 0415)  Labs: Recent Labs    05/27/20 0846 05/27/20 0847 05/27/20 0912 05/29/20 0220  HGB 11.2*  --  12.9  --   HCT 33.0*  --  42.3  --   PLT  --   --  94*  --   APTT  --   --  34  --   LABPROT  --   --  20.9* 22.7*  INR  --   --  1.9* 2.1*  CREATININE 0.80 0.91  --  0.84    Estimated Creatinine Clearance: 39 mL/min (by C-G formula based on SCr of 0.84 mg/dL).   Assessment: 48 YOF presenting as code stroke with left sided weakness.  Patient was on Coumadin 2mg  PO daily PTA for history of Afib, continued inpatient with resolved stroke symptoms.   INR 2.1  Goal of Therapy:  INR 2-3 Monitor platelets by anticoagulation protocol: Yes   Plan:  Coumadin 2mg  PO today Daily PT / INR  Barth Kirks, PharmD, BCPS, BCCCP Clinical Pharmacist [phone]  Please check AMION for all [place] numbers  05/29/2020 9:42 AM

## 2020-05-29 NOTE — Plan of Care (Signed)
Problem: RH Balance Goal: LTG Patient will maintain dynamic sitting balance (PT) Description: LTG:  Patient will maintain dynamic sitting balance with assistance during mobility activities (PT) Outcome: Adequate for Discharge Goal: LTG Patient will maintain dynamic standing balance (PT) Description: LTG:  Patient will maintain dynamic standing balance with assistance during mobility activities (PT) Outcome: Adequate for Discharge Goal: LTG: Patient will maintain dynamic sitting balance (OT) Description: LTG:  Patient will maintain dynamic sitting balance with assistance during activities of daily living (OT) Outcome: Adequate for Discharge Goal: LTG Patient will maintain dynamic standing with ADLs (OT) Description: LTG:  Patient will maintain dynamic standing balance with assist during activities of daily living (OT)  Outcome: Adequate for Discharge   Problem: Sit to Stand Goal: LTG:  Patient will perform sit to stand with assistance level (PT) Description: LTG:  Patient will perform sit to stand with assistance level (PT) Outcome: Adequate for Discharge Goal: LTG:  Patient will perform sit to stand in prep for activites of daily living with assistance level (OT) Description: LTG:  Patient will perform sit to stand in prep for activites of daily living with assistance level (OT) Outcome: Adequate for Discharge   Problem: RH Eating Goal: LTG Patient will perform eating w/assist, cues/equip (OT) Description: LTG: Patient will perform eating with assist, with/without cues using equipment (OT) Outcome: Adequate for Discharge   Problem: RH Grooming Goal: LTG Patient will perform grooming w/assist,cues/equip (OT) Description: LTG: Patient will perform grooming with assist, with/without cues using equipment (OT) Outcome: Adequate for Discharge   Problem: RH Bathing Goal: LTG Patient will bathe all body parts with assist levels (OT) Description: LTG: Patient will bathe all body parts with  assist levels (OT) Outcome: Adequate for Discharge   Problem: RH Dressing Goal: LTG Patient will perform upper body dressing (OT) Description: LTG Patient will perform upper body dressing with assist, with/without cues (OT). Outcome: Adequate for Discharge Goal: LTG Patient will perform lower body dressing w/assist (OT) Description: LTG: Patient will perform lower body dressing with assist, with/without cues in positioning using equipment (OT) Outcome: Adequate for Discharge   Problem: RH Toileting Goal: LTG Patient will perform toileting task (3/3 steps) with assistance level (OT) Description: LTG: Patient will perform toileting task (3/3 steps) with assistance level (OT)  Outcome: Adequate for Discharge   Problem: RH Functional Use of Upper Extremity Goal: LTG Patient will use RT/LT upper extremity as a (OT) Description: LTG: Patient will use right/left upper extremity as a stabilizer/gross assist/diminished/nondominant/dominant level with assist, with/without cues during functional activity (OT) Outcome: Adequate for Discharge   Problem: RH Simple Meal Prep Goal: LTG Patient will perform simple meal prep w/assist (OT) Description: LTG: Patient will perform simple meal prep with assistance, with/without cues (OT). Outcome: Adequate for Discharge   Problem: RH Full Meal Prep Goal: LTG Patient will perform full meal prep w/assist (OT) Description: LTG: Patient will perform full meal prep with assistance, with/without cues (OT). Outcome: Adequate for Discharge   Problem: RH Laundry Goal: LTG Patient will perform laundry w/assist, cues (OT) Description: LTG: Patient will perform laundry with assistance, with/without cues (OT). Outcome: Adequate for Discharge   Problem: RH Light Housekeeping Goal: LTG Patient will perform light housekeeping w/assist (OT) Description: LTG: Patient will perform light housekeeping with assistance, with/without cues (OT). Outcome: Adequate for  Discharge   Problem: RH Bed Mobility Goal: LTG Patient will perform bed mobility with assist (PT) Description: LTG: Patient will perform bed mobility with assistance, with/without cues (PT). Outcome: Adequate for Discharge  Problem: RH Bed to Chair Transfers Goal: LTG Patient will perform bed/chair transfers w/assist (PT) Description: LTG: Patient will perform bed to chair transfers with assistance (PT). Outcome: Adequate for Discharge   Problem: RH Toilet Transfers Goal: LTG Patient will perform toilet transfers w/assist (OT) Description: LTG: Patient will perform toilet transfers with assist, with/without cues using equipment (OT) Outcome: Adequate for Discharge   Problem: RH Tub/Shower Transfers Goal: LTG Patient will perform tub/shower transfers w/assist (OT) Description: LTG: Patient will perform tub/shower transfers with assist, with/without cues using equipment (OT) Outcome: Adequate for Discharge   Problem: RH Car Transfers Goal: LTG Patient will perform car transfers with assist (PT) Description: LTG: Patient will perform car transfers with assistance (PT). Outcome: Adequate for Discharge   Problem: RH Furniture Transfers Goal: LTG Patient will perform furniture transfers w/assist (OT/PT) Description: LTG: Patient will perform furniture transfers  with assistance (OT/PT). Outcome: Adequate for Discharge   Problem: RH Floor Transfers Goal: LTG Patient will perform floor transfers w/assist (PT) Description: LTG: Patient will perform floor transfers with assistance (PT). Outcome: Adequate for Discharge   Problem: RH Ambulation Goal: LTG Patient will ambulate in controlled environment (PT) Description: LTG: Patient will ambulate in a controlled environment, # of feet with assistance (PT). Outcome: Adequate for Discharge Goal: LTG Patient will ambulate in home environment (PT) Description: LTG: Patient will ambulate in home environment, # of feet with assistance  (PT). Outcome: Adequate for Discharge Goal: LTG Patient will ambulate in community environment (PT) Description: LTG: Patient will ambulate in community environment, # of feet with assistance (PT). Outcome: Adequate for Discharge   Problem: RH Wheelchair Mobility Goal: LTG Patient will propel w/c in controlled environment (PT) Description: LTG: Patient will propel wheelchair in controlled environment, # of feet with assist (PT) Outcome: Adequate for Discharge Goal: LTG Patient will propel w/c in home environment (PT) Description: LTG: Patient will propel wheelchair in home environment, # of feet with assistance (PT). Outcome: Adequate for Discharge Goal: LTG Patient will propel w/c in community environment (PT) Description: LTG: Patient will propel wheelchair in community environment, # of feet with assist (PT) Outcome: Adequate for Discharge   Problem: RH Stairs Goal: LTG Patient will ambulate up and down stairs w/assist (PT) Description: LTG: Patient will ambulate up and down # of stairs with assistance (PT) Outcome: Adequate for Discharge   Problem: RH Swallowing Goal: LTG Patient will consume least restrictive diet using compensatory strategies with assistance (SLP) Description: LTG:  Patient will consume least restrictive diet using compensatory strategies with assistance (SLP) Outcome: Adequate for Discharge Goal: LTG Patient will participate in dysphagia therapy to increase swallow function with assistance (SLP) Description: LTG:  Patient will participate in dysphagia therapy to increase swallow function with assistance (SLP) Outcome: Adequate for Discharge Goal: LTG Pt will demonstrate functional change in swallow as evidenced by bedside/clinical objective assessment (SLP) Description: LTG: Patient will demonstrate functional change in swallow as evidenced by bedside/clinical objective assessment (SLP) Outcome: Adequate for Discharge   Problem: RH Cognition - SLP Goal: RH  LTG Patient will demonstrate orientation with cues Description:  LTG:  Patient will demonstrate orientation to person/place/time/situation with cues (SLP)   Outcome: Adequate for Discharge   Problem: RH Comprehension Communication Goal: LTG Patient will comprehend basic/complex auditory (SLP) Description: LTG: Patient will comprehend basic/complex auditory information with cues (SLP). Outcome: Adequate for Discharge   Problem: RH Expression Communication Goal: LTG Patient will express needs/wants via multi-modal(SLP) Description: LTG:  Patient will express needs/wants via multi-modal communication (gestures/written,  etc) with cues (SLP) Outcome: Adequate for Discharge Goal: LTG Patient will verbally express basic/complex needs(SLP) Description: LTG:  Patient will verbally express basic/complex needs, wants or ideas with cues  (SLP) Outcome: Adequate for Discharge Goal: LTG Patient will increase speech intelligibility (SLP) Description: LTG: Patient will increase speech intelligibility at word/phrase/conversation level with cues, % of the time (SLP) Outcome: Adequate for Discharge Goal: LTG Patient will increase word finding of common (SLP) Description: LTG:  Patient will increase word finding of common objects/daily info/abstract thoughts with cues using compensatory strategies (SLP). Outcome: Adequate for Discharge   Problem: RH Problem Solving Goal: LTG Patient will demonstrate problem solving for (SLP) Description: LTG:  Patient will demonstrate problem solving for basic/complex daily situations with cues  (SLP) Outcome: Adequate for Discharge   Problem: RH Memory Goal: LTG Patient will follow step by step directions w/cues (SLP) Description: LTG: Patient will follow step by step directions with cues (SLP). Outcome: Adequate for Discharge Goal: LTG Patient will demonstrate ability for day to day recall/carry over during activities of daily living with assistance level  (PT) Description: LTG:  Patient will demonstrate ability for day to day recall/carry over during activities of daily living with assistance level (PT). Outcome: Adequate for Discharge Goal: LTG Patient will demonstrate ability for day to day recall/carry over during activities of daily living with assistance level (OT) Description: LTG:  Patient will demonstrate ability for day to day recall/carry over during activities of daily living with assistance level (OT). Outcome: Adequate for Discharge Goal: LTG Patient will demonstrate ability for day to day (SLP) Description: LTG:   Patient will demonstrate ability for day to day recall/carryover during cognitive/linguistic activities with assist  (SLP) Outcome: Adequate for Discharge Goal: LTG Patient will use memory compensatory aids to (SLP) Description: LTG:  Patient will use memory compensatory aids to recall biographical/new, daily complex information with cues (SLP) Outcome: Adequate for Discharge   Problem: RH Attention Goal: LTG Patient will demonstrate this level of attention during functional activites (PT) Description: LTG:  Patient will demonstrate this level of attention during functional activites (PT) Outcome: Adequate for Discharge Goal: LTG Patient will demonstrate this level of attention during functional activites (OT) Description: LTG:  Patient will demonstrate this level of attention during functional activites  (OT) Outcome: Adequate for Discharge Goal: LTG Patient will demonstrate this level of attention during functional activites (SLP) Description: LTG:  Patient will will demonstrate this level of attention during functional activites (SLP) Outcome: Adequate for Discharge   Problem: RH Awareness Goal: LTG: Patient will demonstrate awareness during functional activites type of (PT) Description: LTG: Patient will demonstrate awareness during functional activites type of (PT) Outcome: Adequate for Discharge Goal: LTG:  Patient will demonstrate awareness during functional activites type of (OT) Description: LTG: Patient will demonstrate awareness during functional activites type of (OT) Outcome: Adequate for Discharge Goal: LTG: Patient will demonstrate awareness during functional activites type of (SLP) Description: LTG: Patient will demonstrate awareness during functional activites type of (SLP) Outcome: Adequate for Discharge   Problem: RH Leisure Awareness Goal: LTG: Patient will participate in community (TR) Description: LTG: Patient will participate in community reintegration activities to increase ability to identify and adapt to barriers, perform living skills, ambulate/propel wheelchair at specific level  (TR) Outcome: Adequate for Discharge Goal: LTG: Patient will participate in leisure activities (TR) Description: LTG: Patient will participate in leisure activities (simple/moderate/difficult) to increase ability to functionally perform activity, identify and utilize resources, identify new leisure interests, utilize Research scientist (life sciences) at  specific level  (TR) Outcome: Adequate for Discharge   Problem: RH Pre-functional/Other (Specify) Goal: RH LTG PT (Specify) 1 Description:  RH LTG PT (Specify) 1 Outcome: Adequate for Discharge Goal: RH LTG PT (Specify) 2 Description: RH LTG PT (Specify) 2 Outcome: Adequate for Discharge Goal: RH LTG OT (Specify) 1 Description: RH LTG OT (Specify) 1 Outcome: Adequate for Discharge Goal: RH LTG OT (Specify) 2 Description: RH LTG OT (Specify) 2  Outcome: Adequate for Discharge Goal: RH LTG SLP (Specify) 1 Description: RH LTG SLP (Specify) 1 Outcome: Adequate for Discharge Goal: RH LTG SLP (Specify) 2 Description: RH LTG SLP (Specify) 2 Outcome: Adequate for Discharge

## 2020-05-29 NOTE — Progress Notes (Signed)
STROKE TEAM PROGRESS NOTE   INTERVAL HISTORY Patient is sitting up comfortably in bed.  [name] has no complaints.  Repeat CT scan of the head done this morning shows no acute abnormality.  Echocardiogram is unremarkable.  Carotid ultrasound shows no significant extracranial stenosis.  Patient has been discharged today.  Vital signs are stable.  Vitals:   05/28/20 2130 05/29/20 0415 05/29/20 0801 05/29/20 1040  BP: (!) 138/58 (!) 148/76  (!) 120/95  Pulse: 87 81  100  Resp: 18 18  19   Temp: 98 F (36.7 C) (!) 97.4 F (36.3 C)  98 F (36.7 C)  TempSrc: Axillary Axillary  Oral  SpO2: 96% 96% 97% 99%  Weight:       CBC:  Recent Labs  Lab 05/27/20 0846 05/27/20 0912  WBC  --  5.8  NEUTROABS  --  4.1  HGB 11.2* 12.9  HCT 33.0* 42.3  MCV  --  96.6  PLT  --  94*   Basic Metabolic Panel:  Recent Labs  Lab 05/27/20 0847 05/29/20 0220  NA 144 143  K 4.0 4.4  CL 117* 112*  CO2 19* 22  GLUCOSE 71 89  BUN 28* 24*  CREATININE 0.91 0.84  CALCIUM 7.2* 8.6*   Lipid Panel:  Recent Labs  Lab 05/27/20 0912  CHOL 95  TRIG 39  HDL 32*  CHOLHDL 3.0  VLDL 8  LDLCALC 55   HgbA1c:  Recent Labs  Lab 05/27/20 0912  HGBA1C 5.6   Urine Drug Screen:  Recent Labs  Lab 05/27/20 1729  LABOPIA NONE DETECTED  COCAINSCRNUR NONE DETECTED  LABBENZ NONE DETECTED  AMPHETMU NONE DETECTED  THCU NONE DETECTED  LABBARB NONE DETECTED    Alcohol Level  Recent Labs  Lab 05/27/20 0848  ETH <10    IMAGING past 24 hours CT HEAD WO CONTRAST  Result Date: 05/28/2020 CLINICAL DATA:  Stroke follow-up EXAM: CT HEAD WITHOUT CONTRAST TECHNIQUE: Contiguous axial images were obtained from the base of the skull through the vertex without intravenous contrast. COMPARISON:  05/27/2020 FINDINGS: Brain: There is no mass, hemorrhage or extra-axial collection. There is generalized atrophy without lobar predilection. Hypodensity of the white matter is most commonly associated with chronic microvascular  disease. Vascular: Atherosclerotic calcification of the vertebral and internal carotid arteries at the skull base. No abnormal hyperdensity of the major intracranial arteries or dural venous sinuses. Skull: The visualized skull base, calvarium and extracranial soft tissues are normal. Sinuses/Orbits: Moderate opacification of the right frontal, ethmoid and sphenoid sinuses. The orbits are normal. IMPRESSION: 1. Generalized atrophy and chronic microvascular ischemia without acute intracranial abnormality. 2. Right-sided paranasal sinus disease. Electronically Signed   By: Ulyses Jarred M.D.   On: 05/28/2020 22:51    PHYSICAL EXAM   [name] not in distress.  [name] is well-appearing. . Afebrile. Head is nontraumatic. Neck is supple without bruit.    Cardiac exam no murmur or gallop. Lungs are clear to auscultation. Distal pulses are well felt. Neurological Exam : [name] is awake alert [name] is oriented to place and person.  [name] is slightly diminished attention, registration and recall.  [name] follows simple midline and one-step commands.  Extraocular movements appear full range without nystagmus.  [name] blinks to threat bilaterally.  Face is symmetric without weakness.  Tongue is midline.  Motor system exam [name] is able to move all 4 extremities equally well against gravity but not particularly cooperative for detailed muscle testing but I did not see any focal  weakness.  Sensation intact in all 4 extremities.  Plantars are downgoing.  Gait not tested. ASSESSMENT/PLAN [name] is a 86 y.o. female with history of AF on warfarin, HTN, DVT/PE s/p filter, prior stroke, colon cancer s/p resection, CKD3, seizures and dementia presenting with L sided weakness w/ dysarthria w/ complete resolution.   R brain TIA vs small infarct not visualized on CT.  Likely from underlying A. fib on anticoagulation with slightly suboptimal INR on warfarin  Code Stroke CT head small L basal ganglia hypodensity. Old R  basal ganglia lacune. Small vessel disease. Atrophy. ASPECTS 9    MRI  Unable d/t severe kyphosis  Repeat CT at 24h no acute abnormality, no stroke  Carotid Doppler no significant extracranial stenosis bilaterally  2D Echo EF 60-65%. No source of embolus. LA & RA severely dilated  LDL 55  HgbA1c 5.6  VTE prophylaxis - warfarin  aspirin 81 mg daily and warfarin daily prior to admission, now on aspirin 81 mg daily and warfarin daily.   Therapy recommendations:  SNF  Disposition:  pending  NOTHING FURTHER TO ADD FROM THE STROKE STANDPOINT Follow-up Stroke Clinic at [facility] in 4 weeks, order placed.  Atrial Fibrillation Hx PE/DVT  Home anticoagulation:  warfarin daily continued in the hospital  Admission INR 2.1 . Continue warfarin daily at discharge   Hypertension  Stable . Permissive hypertension (OK if < 220/120) but gradually normalize in 5-7 days . Long-term BP goal normotensive  Hyperlipidemia  Home meds:  lipitor 20, resumed in hospital  LDL 55, goal < 70  Continue statin at discharge  Other Stroke Risk Factors  Advanced Age >/= 43   Passive Cigarette smoker  Hx stroke/TIA  Other Active Problems  Baseline dementia  UTI  Chronic thrombocytopenia   Severe protein calorie malnutrition  Depression  COPD  Hx colon cancer s/p resection  Hospital day # 1 Continue warfarin for secondary stroke prevention and aspirin 81 mg daily.  Continue aggressive risk factor modification.  Stroke team will sign off.  Kindly call for questions.  Discussed with Dr. Andi Hence, MD To contact Stroke Continuity provider, please refer to [email or web address] After hours, contact General Neurology

## 2020-05-29 NOTE — Discharge Summary (Addendum)
Physician Discharge Summary  [name] RJJ:[id number] DOB: [birth year] DOA: 05/27/2020  PCP: Gayland Curry, DO  Admit date: 05/27/2020 Discharge date: 05/29/2020  Admitted From: SNF  Disposition:  SNF   Recommendations for Outpatient Follow-up:  1. Follow up with PCP Dr. Mariea Clonts within 1 week 2. Follow up with [facility] within 6 weeks 3. Please check INR and assure goal 2.5 4. Consider transition to [place]: N/A  Equipment/Devices: TBD at SNF  Discharge Condition: Fair  CODE STATUS: DNR Diet recommendation: Cardiac  Brief/Interim Summary: [name] is a 86 y.o. F with hx cAF on warfarin, HTN, hx VTE, hx CVA, and colon CA in remission s/p resection and recent asymptomatic Covid infection, now resolved who presented with acute left-sided weakness, facial droop.  In the ER her urine electrolytes were normal, hemoglobin and white count normal, UA with scant pyuria, EKG unremarkable, and CT head unremarkable.  She is admitted for stroke work-up.     PRINCIPAL HOSPITAL DIAGNOSIS: Acute TIA    Discharge Diagnoses:   TIA History cerebrovascular disease Patient was admitted, could not tolerate MRI.  Repeat CT scan showed no evidence of infarction, hemorrhage, or mass.  Echocardiogram showed no cardiogenic source of embolism.  Carotid imaging unremarkable.  Lipids at goal.  Discharged on continued statin.  Not on aspirin given full dose anticoagulation.    tPA not given because symptoms too mild.  Passed dysphagia screen in ER.  PT recommend continued SNF.  Nonsmoker.     Asymptomatic bacteriuria Patient with pyuria no symptoms.  Treated here with 2 days ceftriaxone, 1 days cephalexin.  No further treatment would improve outcomes in asymptomatic bacteriuria in SNf-dwelling 86-yo F.   Chronic atrial fibrillation Warfarin with INR goal 2.5.  Here was suboptimal at 1.9 INR on admission. No contraindication to DOAC, consider change as  outpatient.  Hypertension At goal 130/80  History of venous thromboembolism Already anticoagulated for A. fib  CKD IIIb Previous records of renal function reviewed, GFR consistently <40 ml/min, >6 months apart.  History of colon cancer, here with constipation  COPD No active flare  Depression          Discharge Instructions  Discharge Instructions    Ambulatory referral to Neurology   Complete by: As directed    Follow up in stroke clinic at [facility] with Frann Rider, NP in about 4 weeks. If not available, consider Dr. Antony Contras, Dr. Bess Harvest, or Dr. Sarina Ill.   For d/c back to SNF once stable   Diet - low sodium heart healthy   Complete by: As directed    Discharge instructions   Complete by: As directed    You were admitted for stroke like symptoms  Your brain imaging showed no stroke  We suspect this was a TIA  You should resume your warfarin, have your INR checked in 3 days  Make sure you are seen by your primary care doctor within 1 week and the Neurologist in 4-6 weeks   Increase activity slowly   Complete by: As directed      Allergies as of 05/29/2020   No Known Allergies     Medication List    TAKE these medications   acetaminophen 500 MG tablet Commonly known as: TYLENOL Take 1,000 mg by mouth every 8 (eight) hours as needed for headache (pain).   albuterol 1.25 MG/3ML nebulizer solution Commonly known as: ACCUNEB Take 2.5 mg by nebulization every 6 (six)  hours as needed for wheezing or shortness of breath.   albuterol 108 (90 Base) MCG/ACT inhaler Commonly known as: VENTOLIN HFA Inhale 2 puffs into the lungs every 6 (six) hours as needed for wheezing or shortness of breath.   Anusol-HC 2.5 % rectal cream Generic drug: hydrocortisone Place 1 application rectally 2 (two) times daily as needed for hemorrhoids or anal itching.   aspirin 81 MG chewable tablet Chew 81 mg by mouth daily.    atorvastatin 20 MG tablet Commonly known as: LIPITOR Take 1 tablet (20 mg total) by mouth daily.   Ensure Take 237 mLs by mouth in the morning, at noon, and at bedtime.   NUTRITIONAL SUPPLEMENT PO Take 1 each by mouth. Magic Cup with L/D meal   escitalopram 10 MG tablet Commonly known as: LEXAPRO Take 10 mg by mouth daily.   guaiFENesin 600 MG 12 hr tablet Commonly known as: MUCINEX Take 600 mg by mouth 2 (two) times daily.   ipratropium-albuterol 0.5-2.5 (3) MG/3ML Soln Commonly known as: DUONEB Take 3 mLs by nebulization every 6 (six) hours. For coughing, wheezing, and shortness of breath.   isosorbide mononitrate 30 MG 24 hr tablet Commonly known as: IMDUR Take 15 mg by mouth daily.   loratadine 10 MG tablet Commonly known as: CLARITIN Take 10 mg by mouth daily.   Metoprolol Tartrate 37.5 MG Tabs Take 1 tablet by mouth 2 (two) times daily.   mometasone-formoterol 100-5 MCG/ACT Aero Commonly known as: DULERA Inhale 2 puffs into the lungs 2 (two) times daily. Gargle and rinse your mouth with water after each use of this medication to help prevent dryness,   multivitamin with minerals Tabs tablet Take 1 tablet by mouth daily.   NON FORMULARY Diet order: Regular/thin diet, Continue Liberalized diet per dietary   OYSTER SHELL/VITAMIN D PO Take 1 tablet by mouth daily. With breakfast   polyethylene glycol 17 g packet Commonly known as: MIRALAX / GLYCOLAX Take 17 g by mouth daily as needed. What changed: reasons to take this   promethazine 12.5 MG tablet Commonly known as: PHENERGAN Take 12.5 mg by mouth 3 (three) times daily as needed for nausea or vomiting.   QUEtiapine 50 MG tablet Commonly known as: SEROQUEL Take 50 mg by mouth at bedtime.   tamsulosin 0.4 MG Caps capsule Commonly known as: FLOMAX Take 1 capsule (0.4 mg total) by mouth daily.   verapamil 120 MG 24 hr capsule Commonly known as: VERELAN PM Take 120 mg by mouth at bedtime.   warfarin  2 MG tablet Commonly known as: COUMADIN Take as directed. If you are unsure how to take this medication, talk to your nurse or doctor. Original instructions: Take 2 mg by mouth daily.       Follow-up Information    [facility] Follow up in 4 week(s).   Specialty: Neurology Why: stroke clinic. office will call with appt date and time Contact information: [place], [place], [place]. Schedule an appointment as soon as possible for a visit in 1 week(s).   Specialty: Geriatric Medicine Contact information: [place]. [place] [address] [phone]              No Known Allergies  Consultations:  Neurology   Procedures/Studies: DG Abd 1 View  Result Date: 05/27/2020 CLINICAL DATA:  Abdominal pain and nausea for 3 days. EXAM: ABDOMEN - 1 VIEW COMPARISON:  Chest  and two views abdomen 06/20/2010. FINDINGS: There is a very large volume of stool throughout the colon. The bowel gas pattern is nonobstructive. No unexpected abdominal calcification is seen. IVC filter and left hip replacement noted. The patient has convex left lumbar scoliosis. Bones are osteopenic. IMPRESSION: No acute finding. Large volume of stool throughout the colon. Electronically Signed   By: Inge Rise M.D.   On: 05/27/2020 14:32   CT HEAD WO CONTRAST  Result Date: 05/28/2020 CLINICAL DATA:  Stroke follow-up EXAM: CT HEAD WITHOUT CONTRAST TECHNIQUE: Contiguous axial images were obtained from the base of the skull through the vertex without intravenous contrast. COMPARISON:  05/27/2020 FINDINGS: Brain: There is no mass, hemorrhage or extra-axial collection. There is generalized atrophy without lobar predilection. Hypodensity of the white matter is most commonly associated with chronic microvascular disease. Vascular: Atherosclerotic calcification of the vertebral and internal carotid arteries at the skull  base. No abnormal hyperdensity of the major intracranial arteries or dural venous sinuses. Skull: The visualized skull base, calvarium and extracranial soft tissues are normal. Sinuses/Orbits: Moderate opacification of the right frontal, ethmoid and sphenoid sinuses. The orbits are normal. IMPRESSION: 1. Generalized atrophy and chronic microvascular ischemia without acute intracranial abnormality. 2. Right-sided paranasal sinus disease. Electronically Signed   By: Ulyses Jarred M.D.   On: 05/28/2020 22:51   ECHOCARDIOGRAM COMPLETE  Result Date: 05/27/2020    ECHOCARDIOGRAM REPORT   Patient Name:   [name] Date of Exam: 05/27/2020 Medical Rec #:  [id number]     Height:       63.0 in Accession #:    [id number]    Weight:       142.9 lb Date of Birth:  12-Sep-1926     BSA:          1.676 m Patient Age:    75 years      BP:           126/71 mmHg Patient Gender: F             HR:           72 bpm. Exam Location:  Inpatient Procedure: 2D Echo, Cardiac Doppler and Color Doppler Indications:    TIA  History:        Patient has prior history of Echocardiogram examinations.                 Arrythmias:Atrial Fibrillation. H/O DVT and pulmonary embolus.                 S/P IVC filter. CKD.  Sonographer:    Clayton Lefort RDCS (AE) Referring Phys: [id number] RONDELL A SMITH IMPRESSIONS  1. Left ventricular ejection fraction, by estimation, is 60 to 65%. The left ventricle has normal function. The left ventricle has no regional wall motion abnormalities. There is severe left ventricular hypertrophy. Left ventricular diastolic function could not be evaluated.  2. Right ventricular systolic function is low normal. The right ventricular size is normal. There is moderately elevated pulmonary artery systolic pressure. The estimated right ventricular systolic pressure is 32.4 mmHg.  3. Left atrial size was severely dilated.  4. Right atrial size was severely dilated.  5. The mitral valve is abnormal. Mild to moderate mitral valve  regurgitation.  6. Tricuspid valve regurgitation is moderate to severe.  7. The aortic valve is abnormal. Aortic valve regurgitation is trivial. Mild to moderate aortic valve sclerosis/calcification is present, without any evidence of aortic stenosis.  8. The inferior vena cava is  normal in size with <50% respiratory variability, suggesting right atrial pressure of 8 mmHg. Comparison(s): No prior Echocardiogram. FINDINGS  Left Ventricle: Left ventricular ejection fraction, by estimation, is 60 to 65%. The left ventricle has normal function. The left ventricle has no regional wall motion abnormalities. The left ventricular internal cavity size was normal in size. There is  severe left ventricular hypertrophy. Left ventricular diastolic function could not be evaluated due to atrial fibrillation. Left ventricular diastolic function could not be evaluated. Right Ventricle: The right ventricular size is normal. No increase in right ventricular wall thickness. Right ventricular systolic function is low normal. There is moderately elevated pulmonary artery systolic pressure. The tricuspid regurgitant velocity  is 3.56 m/s, and with an assumed right atrial pressure of 8 mmHg, the estimated right ventricular systolic pressure is 00.8 mmHg. Left Atrium: Left atrial size was severely dilated. Right Atrium: Right atrial size was severely dilated. Pericardium: There is no evidence of pericardial effusion. Mitral Valve: The mitral valve is abnormal. Mild to moderate mitral annular calcification. Mild to moderate mitral valve regurgitation. Tricuspid Valve: The tricuspid valve is grossly normal. Tricuspid valve regurgitation is moderate to severe. Aortic Valve: The aortic valve is abnormal. Aortic valve regurgitation is trivial. Mild to moderate aortic valve sclerosis/calcification is present, without any evidence of aortic stenosis. Aortic valve mean gradient measures 5.0 mmHg. Aortic valve peak gradient measures 10.0 mmHg.  Aortic valve area, by VTI measures 1.34 cm. Pulmonic Valve: The pulmonic valve was normal in structure. Pulmonic valve regurgitation is not visualized. Aorta: The aortic root and ascending aorta are structurally normal, with no evidence of dilitation. Venous: The inferior vena cava is normal in size with less than 50% respiratory variability, suggesting right atrial pressure of 8 mmHg. IAS/Shunts: No atrial level shunt detected by color flow Doppler.  LEFT VENTRICLE PLAX 2D LVIDd:         4.50 cm LVIDs:         3.00 cm LV PW:         1.70 cm LV IVS:        1.70 cm LVOT diam:     1.80 cm LV SV:         45 LV SV Index:   27 LVOT Area:     2.54 cm  RIGHT VENTRICLE            IVC RV Basal diam:  3.30 cm    IVC diam: 1.70 cm RV S prime:     9.57 cm/s TAPSE (M-mode): 1.8 cm LEFT ATRIUM              Index       RIGHT ATRIUM           Index LA diam:        4.90 cm  2.92 cm/m  RA Area:     26.70 cm LA Vol (A2C):   143.0 ml 85.32 ml/m RA Volume:   84.40 ml  50.36 ml/m LA Vol (A4C):   124.0 ml 73.99 ml/m LA Biplane Vol: 135.0 ml 80.55 ml/m  AORTIC VALVE AV Area (Vmax):    1.22 cm AV Area (Vmean):   1.25 cm AV Area (VTI):     1.34 cm AV Vmax:           158.00 cm/s AV Vmean:          108.000 cm/s AV VTI:            0.333 m AV Peak Grad:  10.0 mmHg AV Mean Grad:      5.0 mmHg LVOT Vmax:         76.00 cm/s LVOT Vmean:        53.200 cm/s LVOT VTI:          0.176 m LVOT/AV VTI ratio: 0.53  AORTA Ao Root diam: 3.10 cm Ao Asc diam:  2.70 cm TRICUSPID VALVE TR Peak grad:   50.7 mmHg TR Vmax:        356.00 cm/s  SHUNTS Systemic VTI:  0.18 m Systemic Diam: 1.80 cm Lyman Bishop MD Electronically signed by Lyman Bishop MD Signature Date/Time: 05/27/2020/5:12:51 PM    Final    CT HEAD CODE STROKE WO CONTRAST  Result Date: 05/27/2020 CLINICAL DATA:  Code stroke.  Neuro deficit, acute, stroke suspected EXAM: CT HEAD WITHOUT CONTRAST TECHNIQUE: Contiguous axial images were obtained from the base of the skull through the  vertex without intravenous contrast. COMPARISON:  06/01/2018 head CT and prior. FINDINGS: Please note image quality is mildly degraded by motion artifact. Brain: No intracranial hemorrhage. Small left basal ganglia hypodensity, new since prior exam (6:15). Left posterior cranial fossa arachnoid cyst, unchanged. No midline shift, ventriculomegaly or extra-axial fluid collection. Diffuse cerebral volume loss with ex vacuo dilatation. Confluent supratentorial white matter hypodensities are unchanged. Chronic right basal ganglia lacunar insult versus dilated perivascular space is unchanged. Vascular: No hyperdense vessel. Bilateral skull base atherosclerotic calcifications. Skull: Negative for fracture or focal lesion. Sinuses/Orbits: No acute orbital finding. Mild pansinus mucosal thickening. Other: None. ASPECTS [facility] Stroke Program Early CT Score) - Ganglionic level infarction (caudate, lentiform nuclei, internal capsule, insula, M1-M3 cortex): 6 - Supraganglionic infarction (M4-M6 cortex): 3 Total score (0-10 with 10 being normal): 9 IMPRESSION: 1. Small age indeterminate left basal ganglia hypodensity may reflect sequela of prior insult versus dilated perivascular space. 2. Chronic right basal gangliar lacunar insult. 3. Age-related cerebral atrophy and chronic microvascular ischemic changes. 4. ASPECTS is 9 Code stroke imaging results were communicated on 05/27/2020 at 9:11 am to provider Dr. Lorrin Goodell via secure text paging. Electronically Signed   By: Primitivo Gauze M.D.   On: 05/27/2020 09:15   VAS US CAROTID (at [facility] and [facility] only)  Result Date: 05/27/2020 Carotid Arterial Duplex Study Indications:       TIA. Risk Factors:      Hypertension, prior CVA. Comparison Study:  no prior Performing Technologist: Abram Sander RVS  Examination Guidelines: A complete evaluation includes B-mode imaging, spectral Doppler, color Doppler, and power Doppler as needed of all accessible portions of each vessel.  Bilateral testing is considered an integral part of a complete examination. Limited examinations for reoccurring indications may be performed as noted.  Right Carotid Findings: +----------+--------+--------+--------+------------------+--------+           PSV cm/sEDV cm/sStenosisPlaque DescriptionComments +----------+--------+--------+--------+------------------+--------+ CCA Prox  57      11              heterogenous               +----------+--------+--------+--------+------------------+--------+ CCA Distal52      11              heterogenous               +----------+--------+--------+--------+------------------+--------+ ICA Prox  62      11      1-39%   heterogenous               +----------+--------+--------+--------+------------------+--------+ ICA Distal35      6                                          +----------+--------+--------+--------+------------------+--------+  ECA       99                                                 +----------+--------+--------+--------+------------------+--------+ +----------+--------+-------+--------+-------------------+           PSV cm/sEDV cmsDescribeArm Pressure (mmHG) +----------+--------+-------+--------+-------------------+ [id number]                                         +----------+--------+-------+--------+-------------------+ +---------+--------+--+--------+-+---------+ VertebralPSV cm/s32EDV cm/s5Antegrade +---------+--------+--+--------+-+---------+  Left Carotid Findings: +----------+--------+--------+--------+------------------+--------+           PSV cm/sEDV cm/sStenosisPlaque DescriptionComments +----------+--------+--------+--------+------------------+--------+ CCA Prox  67      8               heterogenous               +----------+--------+--------+--------+------------------+--------+ CCA Distal53      13              heterogenous                +----------+--------+--------+--------+------------------+--------+ ICA Prox  64      10      1-39%   heterogenous               +----------+--------+--------+--------+------------------+--------+ ICA Distal44      9                                          +----------+--------+--------+--------+------------------+--------+ ECA       85                                                 +----------+--------+--------+--------+------------------+--------+ +----------+--------+--------+--------+-------------------+           PSV cm/sEDV cm/sDescribeArm Pressure (mmHG) +----------+--------+--------+--------+-------------------+ [id number]                                          +----------+--------+--------+--------+-------------------+ +---------+--------+--+--------+-+---------+ VertebralPSV cm/s40EDV cm/s7Antegrade +---------+--------+--+--------+-+---------+   Summary:   *See table(s) above for measurements and observations.  Electronically signed by Monica Martinez MD on 05/27/2020 at 4:52:39 PM.    Final       Subjective: Patient feels at baseline.  No facial droop, no focal weakness, no confusion.  No fever.  No urinary symptoms.  Discharge Exam: Vitals:   05/29/20 0415 05/29/20 0801  BP: (!) 148/76   Pulse: 81   Resp: 18   Temp: (!) 97.4 F (36.3 C)   SpO2: 96% 97%   Vitals:   05/28/20 1406 05/28/20 2130 05/29/20 0415 05/29/20 0801  BP: (!) 149/78 (!) 138/58 (!) 148/76   Pulse: 74 87 81   Resp: 16 18 18    Temp: 97.7 F (36.5 C) 98 F (36.7 C) (!) 97.4 F (36.3 C)   TempSrc:  Axillary Axillary   SpO2: 100% 96% 96% 97%  Weight:        General: Pt is alert, awake, not in acute distress Cardiovascular: RRR, nl S1-S2, no  murmurs appreciated.   No LE edema.   Respiratory: Normal respiratory rate and rhythm.  CTAB without rales or wheezes. Abdominal: Abdomen soft and non-tender.  No distension or HSM.   Neuro/Psych: Strength symmetric in  upper and lower extremities.  Judgment and insight appear normal.   The results of significant diagnostics from this hospitalization (including imaging, microbiology, ancillary and laboratory) are listed below for reference.     Microbiology: Recent Results (from the past 240 hour(s))  Culture, Urine     Status: Abnormal (Preliminary result)   Collection Time: 05/27/20  4:54 AM   Specimen: Urine, Random  Result Value Ref Range Status   Specimen Description URINE, RANDOM  Final   Special Requests NONE  Final   Culture (A)  Final    >=100,000 COLONIES/mL GRAM NEGATIVE RODS IDENTIFICATION AND SUSCEPTIBILITIES TO FOLLOW Performed at [facility], [address], [place], [place] [address]    Report Status PENDING  Incomplete  Respiratory Panel by RT PCR (Flu A&B, Covid) - Nasopharyngeal Swab     Status: None   Collection Time: 05/27/20  9:22 AM   Specimen: Nasopharyngeal Swab; Nasopharyngeal(NP) swabs in vial transport medium  Result Value Ref Range Status   SARS Coronavirus 2 by RT PCR NEGATIVE NEGATIVE Final    Comment: (NOTE) SARS-CoV-2 target nucleic acids are NOT DETECTED.  The SARS-CoV-2 RNA is generally detectable in upper respiratoy specimens during the acute phase of infection. The lowest concentration of SARS-CoV-2 viral copies this assay can detect is 131 copies/mL. A negative result does not preclude SARS-Cov-2 infection and should not be used as the sole basis for treatment or other patient management decisions. A negative result may occur with  improper specimen collection/handling, submission of specimen other than nasopharyngeal swab, presence of viral mutation(s) within the areas targeted by this assay, and inadequate number of viral copies (<131 copies/mL). A negative result must be combined with clinical observations, patient history, and epidemiological information. The expected result is Negative.  Fact Sheet for Patients:   PinkCheek.be  Fact Sheet for Healthcare Providers:  GravelBags.it  This test is no t yet approved or cleared by the [place] FDA and  has been authorized for detection and/or diagnosis of SARS-CoV-2 by FDA under an Emergency Use Authorization (EUA). This EUA will remain  in effect (meaning this test can be used) for the duration of the COVID-19 declaration under Section 564(b)(1) of the Act, 21 U.S.C. section 360bbb-3(b)(1), unless the authorization is terminated or revoked sooner.     Influenza A by PCR NEGATIVE NEGATIVE Final   Influenza B by PCR NEGATIVE NEGATIVE Final    Comment: (NOTE) The Xpert Xpress SARS-CoV-2/FLU/RSV assay is intended as an aid in  the diagnosis of influenza from Nasopharyngeal swab specimens and  should not be used as a sole basis for treatment. Nasal washings and  aspirates are unacceptable for Xpert Xpress SARS-CoV-2/FLU/RSV  testing.  Fact Sheet for Patients: PinkCheek.be  Fact Sheet for Healthcare Providers: GravelBags.it  This test is not yet approved or cleared by the [place] FDA and  has been authorized for detection and/or diagnosis of SARS-CoV-2 by  FDA under an Emergency Use Authorization (EUA). This EUA will remain  in effect (meaning this test can be used) for the duration of the  Covid-19 declaration under Section 564(b)(1) of the Act, 21  U.S.C. section 360bbb-3(b)(1), unless the authorization is  terminated or revoked. Performed at [facility], [place] [address] [place], [place] [address]  Labs: BNP (last 3 results) No results for input(s): BNP in the last 8760 hours. Basic Metabolic Panel: Recent Labs  Lab 05/27/20 0846 05/27/20 0847 05/29/20 0220  NA 147* 144 143  K 3.9 4.0 4.4  CL 115* 117* 112*  CO2  --  19* 22  GLUCOSE 69* 71 89  BUN 34* 28* 24*  CREATININE 0.80 0.91 0.84  CALCIUM   --  7.2* 8.6*   Liver Function Tests: Recent Labs  Lab 05/27/20 0847  AST 21  ALT 10  ALKPHOS 44  BILITOT 0.8  PROT 4.8*  ALBUMIN 2.6*   No results for input(s): LIPASE, AMYLASE in the last 168 hours. No results for input(s): AMMONIA in the last 168 hours. CBC: Recent Labs  Lab 05/27/20 0846 05/27/20 0912  WBC  --  5.8  NEUTROABS  --  4.1  HGB 11.2* 12.9  HCT 33.0* 42.3  MCV  --  96.6  PLT  --  94*   Cardiac Enzymes: No results for input(s): CKTOTAL, CKMB, CKMBINDEX, TROPONINI in the last 168 hours. BNP: Invalid input(s): POCBNP CBG: Recent Labs  Lab 05/27/20 0839  GLUCAP 82   D-Dimer No results for input(s): DDIMER in the last 72 hours. Hgb A1c Recent Labs    05/27/20 0912  HGBA1C 5.6   Lipid Profile Recent Labs    05/27/20 0912  CHOL 95  HDL 32*  LDLCALC 55  TRIG 39  CHOLHDL 3.0   Thyroid function studies No results for input(s): TSH, T4TOTAL, T3FREE, THYROIDAB in the last 72 hours.  Invalid input(s): FREET3 Anemia work up No results for input(s): VITAMINB12, FOLATE, FERRITIN, TIBC, IRON, RETICCTPCT in the last 72 hours. Urinalysis    Component Value Date/Time   COLORURINE YELLOW 05/27/2020 1729   APPEARANCEUR CLOUDY (A) 05/27/2020 1729   APPEARANCEUR Cloudy (A) 01/13/2018 1619   LABSPEC 1.016 05/27/2020 1729   PHURINE 7.0 05/27/2020 1729   GLUCOSEU NEGATIVE 05/27/2020 1729   GLUCOSEU NEGATIVE 09/28/2018 0939   HGBUR NEGATIVE 05/27/2020 1729   BILIRUBINUR NEGATIVE 05/27/2020 1729   BILIRUBINUR Negative 01/13/2018 1619   KETONESUR NEGATIVE 05/27/2020 1729   PROTEINUR 30 (A) 05/27/2020 1729   UROBILINOGEN 0.2 09/28/2018 0939   NITRITE NEGATIVE 05/27/2020 1729   LEUKOCYTESUR LARGE (A) 05/27/2020 1729   Sepsis Labs Invalid input(s): PROCALCITONIN,  WBC,  LACTICIDVEN Microbiology Recent Results (from the past 240 hour(s))  Culture, Urine     Status: Abnormal (Preliminary result)   Collection Time: 05/27/20  4:54 AM   Specimen: Urine,  Random  Result Value Ref Range Status   Specimen Description URINE, RANDOM  Final   Special Requests NONE  Final   Culture (A)  Final    >=100,000 COLONIES/mL GRAM NEGATIVE RODS IDENTIFICATION AND SUSCEPTIBILITIES TO FOLLOW Performed at [facility], [address] [place],  [address]    Report Status PENDING  Incomplete  Respiratory Panel by RT PCR (Flu A&B, Covid) - Nasopharyngeal Swab     Status: None   Collection Time: 05/27/20  9:22 AM   Specimen: Nasopharyngeal Swab; Nasopharyngeal(NP) swabs in vial transport medium  Result Value Ref Range Status   SARS Coronavirus 2 by RT PCR NEGATIVE NEGATIVE Final    Comment: (NOTE) SARS-CoV-2 target nucleic acids are NOT DETECTED.  The SARS-CoV-2 RNA is generally detectable in upper respiratoy specimens during the acute phase of infection. The lowest concentration of SARS-CoV-2 viral copies this assay can detect is 131 copies/mL. A negative result does not preclude SARS-Cov-2 infection and  should not be used as the sole basis for treatment or other patient management decisions. A negative result may occur with  improper specimen collection/handling, submission of specimen other than nasopharyngeal swab, presence of viral mutation(s) within the areas targeted by this assay, and inadequate number of viral copies (<131 copies/mL). A negative result must be combined with clinical observations, patient history, and epidemiological information. The expected result is Negative.  Fact Sheet for Patients:  PinkCheek.be  Fact Sheet for Healthcare Providers:  GravelBags.it  This test is no t yet approved or cleared by the [place] FDA and  has been authorized for detection and/or diagnosis of SARS-CoV-2 by FDA under an Emergency Use Authorization (EUA). This EUA will remain  in effect (meaning this test can be used) for the duration of the COVID-19 declaration under  Section 564(b)(1) of the Act, 21 U.S.C. section 360bbb-3(b)(1), unless the authorization is terminated or revoked sooner.     Influenza A by PCR NEGATIVE NEGATIVE Final   Influenza B by PCR NEGATIVE NEGATIVE Final    Comment: (NOTE) The Xpert Xpress SARS-CoV-2/FLU/RSV assay is intended as an aid in  the diagnosis of influenza from Nasopharyngeal swab specimens and  should not be used as a sole basis for treatment. Nasal washings and  aspirates are unacceptable for Xpert Xpress SARS-CoV-2/FLU/RSV  testing.  Fact Sheet for Patients: PinkCheek.be  Fact Sheet for Healthcare Providers: GravelBags.it  This test is not yet approved or cleared by the [place] FDA and  has been authorized for detection and/or diagnosis of SARS-CoV-2 by  FDA under an Emergency Use Authorization (EUA). This EUA will remain  in effect (meaning this test can be used) for the duration of the  Covid-19 declaration under Section 564(b)(1) of the Act, 21  U.S.C. section 360bbb-3(b)(1), unless the authorization is  terminated or revoked. Performed at [facility], [place] [address], [place], [place] [address]      Time coordinating discharge: 35 minutes The [place] controlled substances registry was reviewed for this patient       SIGNED:   Edwin Dada, MD  [facility] 05/29/2020, 10:15 AM

## 2020-05-29 NOTE — TOC Transition Note (Addendum)
Transition of Care [facility]) - CM/SW Discharge Note   Patient Details  Name: [name] MRN: [id number] Date of Birth: 04-Nov-1926  [facility]) CM/SW Contact:  Angelita Ingles, RN Phone Number: [phone]  05/29/2020, 12:47 PM   Clinical Narrative:    Patient to discharge back to [facility] today. CM spoke with Lexine Baton who confirms that patient is ok to return. Per Lexine Baton covid test needs to be collected but does not have to be resulted for return. Discharge packet placed at nurses station. Bedside nurse has been updated. Bedside nurse to make CM aware once covid swab has been collected.   Please call report to [facility] Rm# [phone] PTAR notified for transportation   Final next level of care: Skilled Nursing Facility Barriers to Discharge: No Barriers Identified   Patient Goals and CMS Choice        Discharge Placement              Patient chooses bed at: [facility] Patient to be transferred to facility by: [place] Name of family member notified: [name] Patient and family notified of of transfer: 05/29/20  Discharge Plan and Services                DME Arranged: N/A DME Agency: NA       HH Arranged: NA HH Agency: NA        Social Determinants of Health (SDOH) Interventions     Readmission Risk Interventions No flowsheet data found.

## 2020-05-31 DIAGNOSIS — N183 Chronic kidney disease, stage 3 unspecified: Secondary | ICD-10-CM | POA: Diagnosis not present

## 2020-05-31 DIAGNOSIS — M6281 Muscle weakness (generalized): Secondary | ICD-10-CM | POA: Diagnosis not present

## 2020-05-31 DIAGNOSIS — R2689 Other abnormalities of gait and mobility: Secondary | ICD-10-CM | POA: Diagnosis not present

## 2020-05-31 DIAGNOSIS — I69891 Dysphagia following other cerebrovascular disease: Secondary | ICD-10-CM | POA: Diagnosis not present

## 2020-05-31 DIAGNOSIS — I69828 Other speech and language deficits following other cerebrovascular disease: Secondary | ICD-10-CM | POA: Diagnosis not present

## 2020-05-31 DIAGNOSIS — Z789 Other specified health status: Secondary | ICD-10-CM | POA: Diagnosis not present

## 2020-05-31 DIAGNOSIS — G459 Transient cerebral ischemic attack, unspecified: Secondary | ICD-10-CM | POA: Diagnosis not present

## 2020-05-31 DIAGNOSIS — I10 Essential (primary) hypertension: Secondary | ICD-10-CM | POA: Diagnosis not present

## 2020-05-31 DIAGNOSIS — R1311 Dysphagia, oral phase: Secondary | ICD-10-CM | POA: Diagnosis not present

## 2020-05-31 DIAGNOSIS — I69328 Other speech and language deficits following cerebral infarction: Secondary | ICD-10-CM | POA: Diagnosis not present

## 2020-05-31 DIAGNOSIS — I5032 Chronic diastolic (congestive) heart failure: Secondary | ICD-10-CM | POA: Diagnosis not present

## 2020-06-01 DIAGNOSIS — I69828 Other speech and language deficits following other cerebrovascular disease: Secondary | ICD-10-CM | POA: Diagnosis not present

## 2020-06-01 DIAGNOSIS — G459 Transient cerebral ischemic attack, unspecified: Secondary | ICD-10-CM | POA: Diagnosis not present

## 2020-06-01 DIAGNOSIS — R1311 Dysphagia, oral phase: Secondary | ICD-10-CM | POA: Diagnosis not present

## 2020-06-01 DIAGNOSIS — I69891 Dysphagia following other cerebrovascular disease: Secondary | ICD-10-CM | POA: Diagnosis not present

## 2020-06-01 DIAGNOSIS — I69328 Other speech and language deficits following cerebral infarction: Secondary | ICD-10-CM | POA: Diagnosis not present

## 2020-06-01 DIAGNOSIS — M6281 Muscle weakness (generalized): Secondary | ICD-10-CM | POA: Diagnosis not present

## 2020-06-03 ENCOUNTER — Other Ambulatory Visit: Payer: Self-pay | Admitting: *Deleted

## 2020-06-03 DIAGNOSIS — R1311 Dysphagia, oral phase: Secondary | ICD-10-CM | POA: Diagnosis not present

## 2020-06-03 DIAGNOSIS — M6281 Muscle weakness (generalized): Secondary | ICD-10-CM | POA: Diagnosis not present

## 2020-06-03 DIAGNOSIS — I69828 Other speech and language deficits following other cerebrovascular disease: Secondary | ICD-10-CM | POA: Diagnosis not present

## 2020-06-03 DIAGNOSIS — G459 Transient cerebral ischemic attack, unspecified: Secondary | ICD-10-CM | POA: Diagnosis not present

## 2020-06-03 DIAGNOSIS — I69891 Dysphagia following other cerebrovascular disease: Secondary | ICD-10-CM | POA: Diagnosis not present

## 2020-06-03 DIAGNOSIS — I69328 Other speech and language deficits following cerebral infarction: Secondary | ICD-10-CM | POA: Diagnosis not present

## 2020-06-03 NOTE — Patient Outreach (Signed)
[facility]) Care Management [place], EMMI Red-Alert notification/ General Discharge [place] Coordination   06/03/2020  Beola Vasallo [facility] 03/16/1927 [id number]  EMMI Red-Alert notification/ General Discharge EMMI call date/ day #: Wednesday May 29, 2020; day # 1 Red-Alert reason(s): "No discharge papers"  Received EMMI Red-Alert notification as above for patient [name], 86 y/o female referred to [facility] CM this morning by [facility] CMA for EMMI Red-Alert notification, as above; patient was recently hospitalized November 22-24, 2021 for TIA.  Received subsequent notification form [facility] [place], notifying [facility] RN CM and [facility] RN [facility] that patient was discharged from hospital back to [facility] she [facility] prior to her hospitalization.  EMMI call deferred, as patient is currently residing in [facility] as she was prior to most recent hospitalization.  Oneta Rack, RN, BSN, [employer] [facility] Care Management  [phone]

## 2020-06-04 ENCOUNTER — Non-Acute Institutional Stay (SKILLED_NURSING_FACILITY): Payer: Medicare Other | Admitting: Internal Medicine

## 2020-06-04 ENCOUNTER — Encounter: Payer: Self-pay | Admitting: Internal Medicine

## 2020-06-04 DIAGNOSIS — G459 Transient cerebral ischemic attack, unspecified: Secondary | ICD-10-CM | POA: Diagnosis not present

## 2020-06-04 DIAGNOSIS — Z66 Do not resuscitate: Secondary | ICD-10-CM

## 2020-06-04 DIAGNOSIS — I69891 Dysphagia following other cerebrovascular disease: Secondary | ICD-10-CM | POA: Diagnosis not present

## 2020-06-04 DIAGNOSIS — F0392 Unspecified dementia, unspecified severity, with psychotic disturbance: Secondary | ICD-10-CM

## 2020-06-04 DIAGNOSIS — R1311 Dysphagia, oral phase: Secondary | ICD-10-CM | POA: Diagnosis not present

## 2020-06-04 DIAGNOSIS — Z8616 Personal history of COVID-19: Secondary | ICD-10-CM

## 2020-06-04 DIAGNOSIS — R531 Weakness: Secondary | ICD-10-CM

## 2020-06-04 DIAGNOSIS — F0391 Unspecified dementia with behavioral disturbance: Secondary | ICD-10-CM

## 2020-06-04 DIAGNOSIS — E441 Mild protein-calorie malnutrition: Secondary | ICD-10-CM

## 2020-06-04 DIAGNOSIS — I4891 Unspecified atrial fibrillation: Secondary | ICD-10-CM | POA: Diagnosis not present

## 2020-06-04 DIAGNOSIS — D6869 Other thrombophilia: Secondary | ICD-10-CM

## 2020-06-04 DIAGNOSIS — I5032 Chronic diastolic (congestive) heart failure: Secondary | ICD-10-CM | POA: Diagnosis not present

## 2020-06-04 DIAGNOSIS — I69828 Other speech and language deficits following other cerebrovascular disease: Secondary | ICD-10-CM | POA: Diagnosis not present

## 2020-06-04 DIAGNOSIS — I4811 Longstanding persistent atrial fibrillation: Secondary | ICD-10-CM | POA: Diagnosis not present

## 2020-06-04 DIAGNOSIS — I69328 Other speech and language deficits following cerebral infarction: Secondary | ICD-10-CM | POA: Diagnosis not present

## 2020-06-04 DIAGNOSIS — M6281 Muscle weakness (generalized): Secondary | ICD-10-CM | POA: Diagnosis not present

## 2020-06-04 NOTE — Progress Notes (Signed)
Provider:  Rexene Edison. Mariea Clonts, D.O., C.M.D. Location:  [place] Room Number: [place] of Service:  SNF [phone])  PCP: Gayland Curry, DO Patient Care Team: Gayland Curry, DO as PCP - General (Geriatric Medicine) Ngetich, Nelda Bucks, NP as Nurse Practitioner (Family Medicine) Rehab, [place] (Friend)  Extended Emergency Contact Information Primary Emergency Contact: [name],[name] Address: [address]          [place], [place] [address] [name] of [facility] Phone: [phone] Mobile Phone: [phone] Relation: Daughter  Code Status: DNR Goals of Care: Advanced Directive information Advanced Directives 06/04/2020  Does Patient Have a Medical Advance Directive? Yes  Type of Paramedic of [place];Living will  Does patient want to make changes to medical advance directive? No - Patient declined  Copy of [place] in Chart? Yes - validated most recent copy scanned in chart (See row information)  Would patient like information on creating a medical advance directive? -  Pre-existing out of facility DNR order (yellow form or pink MOST form) -   Chief Complaint  Patient presents with  . Readmit To SNF    S/P left sided weakness at [facility]     HPI: Patient is a 86 y.o. female seen today for readmission to [facility] status post hospitalization with acute left-sided weakness and left facial droop noted at 7:15 AM by half her physical therapist here.  [name] has a past medical history significant for atrial fibrillation on chronic anticoagulation, hypertension, prior stroke, DVT/PE, and colon cancer status post resection.  She had been at baseline just 15 minutes prior to that.  She was getting up and ready for breakfast when this transpired.  Her speech has been somewhat slurred over the past month.  He did not have associated headache, chest pain, nausea,  vomiting, or leg pain or swelling.  Dr. Angelena Form has recommended her to be on both Coumadin and aspirin long-term.  The notes indicate that her Coumadin was was stopped temporarily but this was only held when the INR was supratherapeutic.  At hospital admission on November 22 she also had some right lower abdominal pain and had had some flatus and BM the night before.  She had also tested positive for Covid approximately 2 weeks prior.  Her symptoms of weakness have since resolved.  In the emergency department she had presented as a code stroke and neurology evaluated her initial CT scan which showed a small hypodensity in the left basal ganglia age-indeterminate.  She is not a TPA candidate due to her anticoagulation.  Labs were significant only for platelets of 94, BUN of 28 and creatinine of 0.91.  Covid and flu screens were negative.  She was admitted to a telemetry bed with regular neuro checks and MRI/MRA, carotid ultrasound, echocardiogram, A1c, lipid panel were all ordered as well as PT OT and speech therapy.  Was placed on permissive hypertension only for blood pressure treated over 220.  Course labs were done including a urine culture with a positive urinalysis so she was given Rocephin IV and culture was sent off.  Her INR was slightly subtherapeutic at 1.9 at hospital admission.  Coumadin was dosed there per pharmacy.  An abdominal x-ray revealed large stool burden and she was started on Senokot-S.  She was noted to have protein calorie malnutrition with an albumin of 2.9.  Prealbumin was checked and Ensure given with meals.  In her stay, she was unable to tolerate her MRI, but repeat CT scan showed no evidence of infarction hemorrhage or mass.  Echocardiogram showed no cardiogenic source of embolism.  Carotid imaging was also unremarkable.  Lipids were at goal and she was continued on her statin therapy.  She is continued on baby aspirin due to her Coumadin so not taking a full dose aspirin.   She passed the dysphagia screen in the emergency room.  PT recommended continued SNF.  She had been asymptomatic in terms of the pyuria on her UA and had received 2 days of ceftriaxone and 1 day of Keflex at the hospital.  Antibiotics were then discontinued due to asymptomatic bacteriuria.  Not discharge her INR was at goal at 2.5 on 2 mg of Coumadin daily.  Novel oral anticoagulation was suggested.  When I assumed care of this patient was told that that discussion has been held multiple times in this transition had been declined by family members.  It is recommended that she follow-up with [facility] neurology outpatient in 4 weeks.  When seen today, she reported feeling well.  She was in good spirits.  She said that occasionally, her thighs will hurt where they rest on the end of her wheelchair cushion.  She sleeps well.  Appetite by her report is good.  She has no residual weakness from her TIA with left side strength back to baseline.    Past Medical History:  Diagnosis Date  . Acute encephalopathy   . Acute renal failure superimposed on stage 3 chronic kidney disease ([place])   . Anemia   . Atrial fibrillation ([place])   . Colon cancer ([place]) 12/11  . Colon cancer ([place]) 2011  . Diarrhea   . DVT (deep venous thrombosis) ([employer]) 11/06  . Elevated brain natriuretic peptide (BNP) level   . Frequent headaches   . Heart murmur   . Hyperkalemia   . Hypertension   . Pulmonary embolism ([place]) 11/06  . Seizures ([place])   . Stroke ([place]) 11/30/2014  . Urine retention 01/2019   Past Surgical History:  Procedure Laterality Date  . APPENDECTOMY  1952  . COLECTOMY  06/2010   partial, Dr. Donne Hazel complicated by LLE CVT; S/P IVC umbrella & anemia  . HIP FRACTURE SURGERY  2006   Trauma  . TOTAL ABDOMINAL HYSTERECTOMY W/ BILATERAL SALPINGOOPHORECTOMY  1973   For Fibroids  . TOTAL HIP ARTHROPLASTY  01/03/07   Left hip replacement.  Marland Kitchen VENA CAVA FILTER PLACEMENT  06/28/2010    Social History    Socioeconomic History  . Marital status: Widowed    Spouse name: Not on file  . Number of children: 1  . Years of education: Not on file  . Highest education level: Not on file  Occupational History  . Occupation: n/d    Employer: RETIRED  Tobacco Use  . Smoking status: Passive Smoke Exposure - Never Smoker  . Smokeless tobacco: Never Used  . Tobacco comment: husband smoked in home.   Vaping Use  . Vaping Use: Never used  Substance and Sexual Activity  . Alcohol use: No    Alcohol/week: 0.0 standard drinks  . Drug use: No  . Sexual activity: Not Currently  Other Topics Concern  . Not on file  Social History Narrative   Lives w/ her daughter    Social Determinants of Health   Financial Resource Strain:   . Difficulty of Paying Living Expenses: Not on file  Food Insecurity:   .  Worried About Charity fundraiser in the Last Year: Not on file  . Ran Out of Food in the Last Year: Not on file  Transportation Needs:   . Lack of Transportation (Medical): Not on file  . Lack of Transportation (Non-Medical): Not on file  Physical Activity:   . Days of Exercise per Week: Not on file  . Minutes of Exercise per Session: Not on file  Stress:   . Feeling of Stress : Not on file  Social Connections:   . Frequency of Communication with Friends and Family: Not on file  . Frequency of Social Gatherings with Friends and Family: Not on file  . Attends Religious Services: Not on file  . Active Member of Clubs or Organizations: Not on file  . Attends Archivist Meetings: Not on file  . Marital Status: Not on file    reports that she is a non-smoker but has been exposed to tobacco smoke. She has never used smokeless tobacco. She reports that she does not drink alcohol and does not use drugs.  Functional Status Survey:    Family History  Problem Relation Age of Onset  . Peripheral vascular disease Father   . Heart attack Mother 15  . Heart disease Mother        MI  .  Coronary artery disease Sister   . Diabetes Paternal Grandmother   . Coronary artery disease Maternal Grandfather     Health Maintenance  Topic Date Due  . DEXA SCAN  Never done  . INFLUENZA VACCINE  02/04/2020  . TETANUS/TDAP  10/31/2026  . COVID-19 Vaccine  Completed  . PNA vac Low Risk Adult  Completed    No Known Allergies  Outpatient Encounter Medications as of 06/04/2020  Medication Sig  . acetaminophen (TYLENOL) 500 MG tablet Take 1,000 mg by mouth every 8 (eight) hours as needed for headache (pain).   Marland Kitchen albuterol (ACCUNEB) 1.25 MG/3ML nebulizer solution Take 2.5 mg by nebulization every 6 (six) hours as needed for wheezing or shortness of breath.   Marland Kitchen albuterol (VENTOLIN HFA) 108 (90 Base) MCG/ACT inhaler Inhale 2 puffs into the lungs every 6 (six) hours as needed for wheezing or shortness of breath.  Marland Kitchen aspirin 81 MG chewable tablet Chew 81 mg by mouth daily.  Marland Kitchen atorvastatin (LIPITOR) 20 MG tablet Take 1 tablet (20 mg total) by mouth daily.  . bisacodyl (DULCOLAX) 10 MG suppository Place 10 mg rectally as needed for moderate constipation.  . Calcium Carbonate-Vitamin D (OYSTER SHELL/VITAMIN D PO) Take 1 tablet by mouth daily. With breakfast  . Ensure (ENSURE) Take 237 mLs by mouth in the morning, at noon, and at bedtime.  Marland Kitchen escitalopram (LEXAPRO) 10 MG tablet Take 10 mg by mouth daily.  Marland Kitchen guaiFENesin (MUCINEX) 600 MG 12 hr tablet Take 600 mg by mouth 2 (two) times daily.   . hydrocortisone (ANUSOL-HC) 2.5 % rectal cream Place 1 application rectally 2 (two) times daily as needed for hemorrhoids or anal itching.  Marland Kitchen ipratropium-albuterol (DUONEB) 0.5-2.5 (3) MG/3ML SOLN Take 3 mLs by nebulization every 6 (six) hours. For coughing, wheezing, and shortness of breath.  . isosorbide mononitrate (IMDUR) 30 MG 24 hr tablet Take 15 mg by mouth daily.  Marland Kitchen loratadine (CLARITIN) 10 MG tablet Take 10 mg by mouth daily.  . Metoprolol Tartrate 37.5 MG TABS Take 1 tablet by mouth 2 (two) times  daily.  . mometasone-formoterol (DULERA) 100-5 MCG/ACT AERO Inhale 2 puffs into the lungs 2 (two)  times daily. Gargle and rinse your mouth with water after each use of this medication to help prevent dryness,  . Multiple Vitamin (MULTIVITAMIN WITH MINERALS) TABS tablet Take 1 tablet by mouth daily.  . NON FORMULARY Diet order: Regular/thin diet, Continue Liberalized diet per dietary  . Nutritional Supplements (NUTRITIONAL SUPPLEMENT PO) Take 1 each by mouth. Magic Cup with L/D meal  . polyethylene glycol (MIRALAX / GLYCOLAX) packet Take 17 g by mouth daily as needed.  . promethazine (PHENERGAN) 12.5 MG tablet Take 12.5 mg by mouth 3 (three) times daily as needed for nausea or vomiting.  Marland Kitchen QUEtiapine (SEROQUEL) 50 MG tablet Take 50 mg by mouth at bedtime.  . tamsulosin (FLOMAX) 0.4 MG CAPS capsule Take 1 capsule (0.4 mg total) by mouth daily.  . verapamil (VERELAN PM) 120 MG 24 hr capsule Take 120 mg by mouth at bedtime.  Marland Kitchen warfarin (COUMADIN) 2 MG tablet Take 2 mg by mouth daily.  . [DISCONTINUED] magnesium hydroxide (MILK OF MAGNESIA) 400 MG/5ML suspension Take by mouth daily as needed for mild constipation.   No facility-administered encounter medications on file as of 06/04/2020.    Review of Systems  Constitutional: Negative for chills, fever and malaise/fatigue.  HENT: Negative for congestion and sore throat.   Respiratory: Negative for cough and shortness of breath.   Cardiovascular: Negative for chest pain, palpitations and leg swelling.  Gastrointestinal: Negative for abdominal pain, blood in stool, constipation and melena.  Genitourinary: Negative for dysuria.  Musculoskeletal: Negative for falls and joint pain.  Skin: Negative for itching and rash.  Neurological: Negative for dizziness and loss of consciousness.       All symptoms resolved from TIA  Endo/Heme/Allergies: Bruises/bleeds easily.  Psychiatric/Behavioral: Positive for memory loss. Negative for depression. The patient  is not nervous/anxious and does not have insomnia.     Vitals:   06/04/20 1040  BP: 135/76  Pulse: 69  Temp: (!) 97.3 F (36.3 C)  Weight: 138 lb 10.7 oz (62.9 kg)  Height: 5\' 3"  (1.6 m)   Body mass index is 24.56 kg/m. Physical Exam Vitals reviewed.  Constitutional:      General: She is not in acute distress.    Appearance: Normal appearance. She is not ill-appearing or toxic-appearing.  HENT:     Head: Normocephalic and atraumatic.     Right Ear: External ear normal.     Left Ear: External ear normal.     Mouth/Throat:     Pharynx: Oropharynx is clear.  Eyes:     Conjunctiva/sclera: Conjunctivae normal.     Pupils: Pupils are equal, round, and reactive to light.  Cardiovascular:     Rate and Rhythm: Rhythm irregular.  Pulmonary:     Effort: Pulmonary effort is normal.     Breath sounds: Normal breath sounds. No wheezing, rhonchi or rales.  Abdominal:     General: Bowel sounds are normal.     Palpations: Abdomen is soft.     Tenderness: There is no abdominal tenderness. There is no guarding or rebound.  Musculoskeletal:        General: Normal range of motion.     Right lower leg: No edema.     Left lower leg: No edema.  Skin:    General: Skin is warm and dry.     Capillary Refill: Capillary refill takes less than 2 seconds.  Neurological:     Mental Status: She is alert.     Motor: Weakness present.     Gait:  Gait abnormal.     Comments: Chronic left hemiparesis, some dysarthria (but may be more dental than neurologic), self-propels in manual wheelchair  Psychiatric:        Mood and Affect: Mood normal.        Behavior: Behavior normal.     Labs reviewed: Basic Metabolic Panel: Recent Labs    03/22/20 0000 03/22/20 0000 05/27/20 0846 05/27/20 0847 05/29/20 0220  NA 146  --  147* 144 143  K 3.9   < > 3.9 4.0 4.4  CL 109*   < > 115* 117* 112*  CO2 23*  --   --  19* 22  GLUCOSE  --   --  69* 71 89  BUN 30*  --  34* 28* 24*  CREATININE 1.2*  --  0.80  0.91 0.84  CALCIUM 8.7  --   --  7.2* 8.6*   < > = values in this interval not displayed.   Liver Function Tests: Recent Labs    12/07/19 0000 05/27/20 0847  AST 15 21  ALT 6* 10  ALKPHOS  --  44  BILITOT  --  0.8  PROT  --  4.8*  ALBUMIN 4.3 2.6*   No results for input(s): LIPASE, AMYLASE in the last 8760 hours. No results for input(s): AMMONIA in the last 8760 hours. CBC: Recent Labs    08/11/19 0000 08/11/19 0000 09/07/19 0000 11/09/19 0000 12/07/19 0000 12/07/19 0000 03/22/20 0000 05/27/20 0846 05/27/20 0912  WBC 5.2   < > 3.4   < > 6.6  --  3.9  --  5.8  NEUTROABS 4  --  2  --   --   --   --   --  4.1  HGB 12.4   < > 11.9*   < > 11.8*   < > 12.3 11.2* 12.9  HCT 37   < > 36   < > 36   < > 38 33.0* 42.3  MCV  --   --   --   --   --   --   --   --  96.6  PLT 69*   < > 63*  --   --   --  59*  --  94*   < > = values in this interval not displayed.   Cardiac Enzymes: No results for input(s): CKTOTAL, CKMB, CKMBINDEX, TROPONINI in the last 8760 hours. BNP: Invalid input(s): POCBNP Lab Results  Component Value Date   HGBA1C 5.6 05/27/2020   Lab Results  Component Value Date   TSH 3.58 08/22/2018   Lab Results  Component Value Date   VITAMINB12 880 08/22/2018   Lab Results  Component Value Date   FOLATE >24.0 08/22/2018   Lab Results  Component Value Date   IRON 70 10/28/2010   TIBC 413 06/13/2010   FERRITIN 7 (L) 06/13/2010    Imaging and Procedures obtained prior to SNF admission: DG Abd 1 View  Result Date: 05/27/2020 CLINICAL DATA:  Abdominal pain and nausea for 3 days. EXAM: ABDOMEN - 1 VIEW COMPARISON:  Chest and two views abdomen 06/20/2010. FINDINGS: There is a very large volume of stool throughout the colon. The bowel gas pattern is nonobstructive. No unexpected abdominal calcification is seen. IVC filter and left hip replacement noted. The patient has convex left lumbar scoliosis. Bones are osteopenic. IMPRESSION: No acute finding. Large  volume of stool throughout the colon. Electronically Signed   By: Inge Rise M.D.   On:  05/27/2020 14:32   CT HEAD WO CONTRAST  Result Date: 05/28/2020 CLINICAL DATA:  Stroke follow-up EXAM: CT HEAD WITHOUT CONTRAST TECHNIQUE: Contiguous axial images were obtained from the base of the skull through the vertex without intravenous contrast. COMPARISON:  05/27/2020 FINDINGS: Brain: There is no mass, hemorrhage or extra-axial collection. There is generalized atrophy without lobar predilection. Hypodensity of the white matter is most commonly associated with chronic microvascular disease. Vascular: Atherosclerotic calcification of the vertebral and internal carotid arteries at the skull base. No abnormal hyperdensity of the major intracranial arteries or dural venous sinuses. Skull: The visualized skull base, calvarium and extracranial soft tissues are normal. Sinuses/Orbits: Moderate opacification of the right frontal, ethmoid and sphenoid sinuses. The orbits are normal. IMPRESSION: 1. Generalized atrophy and chronic microvascular ischemia without acute intracranial abnormality. 2. Right-sided paranasal sinus disease. Electronically Signed   By: Ulyses Jarred M.D.   On: 05/28/2020 22:51   ECHOCARDIOGRAM COMPLETE  Result Date: 05/27/2020    ECHOCARDIOGRAM REPORT   Patient Name:   [name] Date of Exam: 05/27/2020 Medical Rec #:  [id number]     Height:       63.0 in Accession #:    [id number]    Weight:       142.9 lb Date of Birth:  05-23-1927     BSA:          1.676 m Patient Age:    75 years      BP:           126/71 mmHg Patient Gender: F             HR:           72 bpm. Exam Location:  Inpatient Procedure: 2D Echo, Cardiac Doppler and Color Doppler Indications:    TIA  History:        Patient has prior history of Echocardiogram examinations.                 Arrythmias:Atrial Fibrillation. H/O DVT and pulmonary embolus.                 S/P IVC filter. CKD.  Sonographer:    Clayton Lefort RDCS (AE)  Referring Phys: [id number] RONDELL A SMITH IMPRESSIONS  1. Left ventricular ejection fraction, by estimation, is 60 to 65%. The left ventricle has normal function. The left ventricle has no regional wall motion abnormalities. There is severe left ventricular hypertrophy. Left ventricular diastolic function could not be evaluated.  2. Right ventricular systolic function is low normal. The right ventricular size is normal. There is moderately elevated pulmonary artery systolic pressure. The estimated right ventricular systolic pressure is 41.9 mmHg.  3. Left atrial size was severely dilated.  4. Right atrial size was severely dilated.  5. The mitral valve is abnormal. Mild to moderate mitral valve regurgitation.  6. Tricuspid valve regurgitation is moderate to severe.  7. The aortic valve is abnormal. Aortic valve regurgitation is trivial. Mild to moderate aortic valve sclerosis/calcification is present, without any evidence of aortic stenosis.  8. The inferior vena cava is normal in size with <50% respiratory variability, suggesting right atrial pressure of 8 mmHg. Comparison(s): No prior Echocardiogram. FINDINGS  Left Ventricle: Left ventricular ejection fraction, by estimation, is 60 to 65%. The left ventricle has normal function. The left ventricle has no regional wall motion abnormalities. The left ventricular internal cavity size was normal in size. There is  severe left ventricular hypertrophy. Left ventricular diastolic function could not  be evaluated due to atrial fibrillation. Left ventricular diastolic function could not be evaluated. Right Ventricle: The right ventricular size is normal. No increase in right ventricular wall thickness. Right ventricular systolic function is low normal. There is moderately elevated pulmonary artery systolic pressure. The tricuspid regurgitant velocity  is 3.56 m/s, and with an assumed right atrial pressure of 8 mmHg, the estimated right ventricular systolic pressure is 38.1  mmHg. Left Atrium: Left atrial size was severely dilated. Right Atrium: Right atrial size was severely dilated. Pericardium: There is no evidence of pericardial effusion. Mitral Valve: The mitral valve is abnormal. Mild to moderate mitral annular calcification. Mild to moderate mitral valve regurgitation. Tricuspid Valve: The tricuspid valve is grossly normal. Tricuspid valve regurgitation is moderate to severe. Aortic Valve: The aortic valve is abnormal. Aortic valve regurgitation is trivial. Mild to moderate aortic valve sclerosis/calcification is present, without any evidence of aortic stenosis. Aortic valve mean gradient measures 5.0 mmHg. Aortic valve peak gradient measures 10.0 mmHg. Aortic valve area, by VTI measures 1.34 cm. Pulmonic Valve: The pulmonic valve was normal in structure. Pulmonic valve regurgitation is not visualized. Aorta: The aortic root and ascending aorta are structurally normal, with no evidence of dilitation. Venous: The inferior vena cava is normal in size with less than 50% respiratory variability, suggesting right atrial pressure of 8 mmHg. IAS/Shunts: No atrial level shunt detected by color flow Doppler.  LEFT VENTRICLE PLAX 2D LVIDd:         4.50 cm LVIDs:         3.00 cm LV PW:         1.70 cm LV IVS:        1.70 cm LVOT diam:     1.80 cm LV SV:         45 LV SV Index:   27 LVOT Area:     2.54 cm  RIGHT VENTRICLE            IVC RV Basal diam:  3.30 cm    IVC diam: 1.70 cm RV S prime:     9.57 cm/s TAPSE (M-mode): 1.8 cm LEFT ATRIUM              Index       RIGHT ATRIUM           Index LA diam:        4.90 cm  2.92 cm/m  RA Area:     26.70 cm LA Vol (A2C):   143.0 ml 85.32 ml/m RA Volume:   84.40 ml  50.36 ml/m LA Vol (A4C):   124.0 ml 73.99 ml/m LA Biplane Vol: 135.0 ml 80.55 ml/m  AORTIC VALVE AV Area (Vmax):    1.22 cm AV Area (Vmean):   1.25 cm AV Area (VTI):     1.34 cm AV Vmax:           158.00 cm/s AV Vmean:          108.000 cm/s AV VTI:            0.333 m AV Peak  Grad:      10.0 mmHg AV Mean Grad:      5.0 mmHg LVOT Vmax:         76.00 cm/s LVOT Vmean:        53.200 cm/s LVOT VTI:          0.176 m LVOT/AV VTI ratio: 0.53  AORTA Ao Root diam: 3.10 cm Ao Asc diam:  2.70 cm TRICUSPID  VALVE TR Peak grad:   50.7 mmHg TR Vmax:        356.00 cm/s  SHUNTS Systemic VTI:  0.18 m Systemic Diam: 1.80 cm Lyman Bishop MD Electronically signed by Lyman Bishop MD Signature Date/Time: 05/27/2020/5:12:51 PM    Final    CT HEAD CODE STROKE WO CONTRAST  Result Date: 05/27/2020 CLINICAL DATA:  Code stroke.  Neuro deficit, acute, stroke suspected EXAM: CT HEAD WITHOUT CONTRAST TECHNIQUE: Contiguous axial images were obtained from the base of the skull through the vertex without intravenous contrast. COMPARISON:  06/01/2018 head CT and prior. FINDINGS: Please note image quality is mildly degraded by motion artifact. Brain: No intracranial hemorrhage. Small left basal ganglia hypodensity, new since prior exam (6:15). Left posterior cranial fossa arachnoid cyst, unchanged. No midline shift, ventriculomegaly or extra-axial fluid collection. Diffuse cerebral volume loss with ex vacuo dilatation. Confluent supratentorial white matter hypodensities are unchanged. Chronic right basal ganglia lacunar insult versus dilated perivascular space is unchanged. Vascular: No hyperdense vessel. Bilateral skull base atherosclerotic calcifications. Skull: Negative for fracture or focal lesion. Sinuses/Orbits: No acute orbital finding. Mild pansinus mucosal thickening. Other: None. ASPECTS [facility] Stroke Program Early CT Score) - Ganglionic level infarction (caudate, lentiform nuclei, internal capsule, insula, M1-M3 cortex): 6 - Supraganglionic infarction (M4-M6 cortex): 3 Total score (0-10 with 10 being normal): 9 IMPRESSION: 1. Small age indeterminate left basal ganglia hypodensity may reflect sequela of prior insult versus dilated perivascular space. 2. Chronic right basal gangliar lacunar insult. 3.  Age-related cerebral atrophy and chronic microvascular ischemic changes. 4. ASPECTS is 9 Code stroke imaging results were communicated on 05/27/2020 at 9:11 am to provider Dr. Lorrin Goodell via secure text paging. Electronically Signed   By: Primitivo Gauze M.D.   On: 05/27/2020 09:15   VAS US CAROTID (at [facility] and [facility] only)  Result Date: 05/27/2020 Carotid Arterial Duplex Study Indications:       TIA. Risk Factors:      Hypertension, prior CVA. Comparison Study:  no prior Performing Technologist: Abram Sander RVS  Examination Guidelines: A complete evaluation includes B-mode imaging, spectral Doppler, color Doppler, and power Doppler as needed of all accessible portions of each vessel. Bilateral testing is considered an integral part of a complete examination. Limited examinations for reoccurring indications may be performed as noted.  Right Carotid Findings: +----------+--------+--------+--------+------------------+--------+           PSV cm/sEDV cm/sStenosisPlaque DescriptionComments +----------+--------+--------+--------+------------------+--------+ CCA Prox  57      11              heterogenous               +----------+--------+--------+--------+------------------+--------+ CCA Distal52      11              heterogenous               +----------+--------+--------+--------+------------------+--------+ ICA Prox  62      11      1-39%   heterogenous               +----------+--------+--------+--------+------------------+--------+ ICA Distal35      6                                          +----------+--------+--------+--------+------------------+--------+ ECA       99                                                 +----------+--------+--------+--------+------------------+--------+ +----------+--------+-------+--------+-------------------+  PSV cm/sEDV cmsDescribeArm Pressure (mmHG) +----------+--------+-------+--------+-------------------+  [id number]                                         +----------+--------+-------+--------+-------------------+ +---------+--------+--+--------+-+---------+ VertebralPSV cm/s32EDV cm/s5Antegrade +---------+--------+--+--------+-+---------+  Left Carotid Findings: +----------+--------+--------+--------+------------------+--------+           PSV cm/sEDV cm/sStenosisPlaque DescriptionComments +----------+--------+--------+--------+------------------+--------+ CCA Prox  67      8               heterogenous               +----------+--------+--------+--------+------------------+--------+ CCA Distal53      13              heterogenous               +----------+--------+--------+--------+------------------+--------+ ICA Prox  64      10      1-39%   heterogenous               +----------+--------+--------+--------+------------------+--------+ ICA Distal44      9                                          +----------+--------+--------+--------+------------------+--------+ ECA       85                                                 +----------+--------+--------+--------+------------------+--------+ +----------+--------+--------+--------+-------------------+           PSV cm/sEDV cm/sDescribeArm Pressure (mmHG) +----------+--------+--------+--------+-------------------+ [id number]                                          +----------+--------+--------+--------+-------------------+ +---------+--------+--+--------+-+---------+ VertebralPSV cm/s40EDV cm/s7Antegrade +---------+--------+--+--------+-+---------+   Summary:   *See table(s) above for measurements and observations.  Electronically signed by Monica Martinez MD on 05/27/2020 at 4:52:39 PM.    Final     Assessment/Plan 1. TIA (transient ischemic attack) -? Related to covid infection or simply due to afib and cerebrovascular disease -continue asa and coumadin anticoagulation, lipitor for  cholesterol and adequate bp control -she is not c/o having too many pills to take  2. Dementia with psychosis ([place]) -currently in good mood, talkative and appropriate -has seroquel at hs due to psychosis -reports she wakes up often, but is able to get back to sleep  3. Chronic diastolic congestive heart failure (HCC) -no signs of exacerbation and denies sob -cont bp control, not on regular diuretics  4. Longstanding persistent atrial fibrillation (HCC) -rate controlled with verapamil, on baby asa daily and her warfarin  5. Hypercoagulable state due to atrial fibrillation (HCC) -cont coumadin for INR goal 2-3   6. Personal history of COVID-19 -recently, but has recovered well (? TIA related)  7. Mild protein-calorie malnutrition ([place]) -encourage po of things she enjoys and regular snacks to maintain weight, supplement shakes   8. Generalized weakness -requires snf care to assist with adls and use of wheelchair  9. DNR (do not resuscitate) - Do not attempt resuscitation (DNR) confirmed    Family/ staff Communication: d/w snf nurse  Labs/tests ordered:  INR  Kace Hartje L.  Keeva Reisen, D.O. [facility] [address], [place] [address] Cell Phone (Mon-Fri 8am-5pm):  [phone] On Call:  [phone] & follow prompts after 5pm & weekends Office Phone:  [phone] Office Fax:  [phone]

## 2020-06-05 DIAGNOSIS — R2689 Other abnormalities of gait and mobility: Secondary | ICD-10-CM | POA: Diagnosis not present

## 2020-06-05 DIAGNOSIS — R1311 Dysphagia, oral phase: Secondary | ICD-10-CM | POA: Diagnosis not present

## 2020-06-05 DIAGNOSIS — M6281 Muscle weakness (generalized): Secondary | ICD-10-CM | POA: Diagnosis not present

## 2020-06-05 DIAGNOSIS — I69891 Dysphagia following other cerebrovascular disease: Secondary | ICD-10-CM | POA: Diagnosis not present

## 2020-06-05 DIAGNOSIS — I69828 Other speech and language deficits following other cerebrovascular disease: Secondary | ICD-10-CM | POA: Diagnosis not present

## 2020-06-05 DIAGNOSIS — I69328 Other speech and language deficits following cerebral infarction: Secondary | ICD-10-CM | POA: Diagnosis not present

## 2020-06-05 DIAGNOSIS — G459 Transient cerebral ischemic attack, unspecified: Secondary | ICD-10-CM | POA: Diagnosis not present

## 2020-06-06 ENCOUNTER — Other Ambulatory Visit: Payer: Self-pay | Admitting: *Deleted

## 2020-06-06 DIAGNOSIS — R2689 Other abnormalities of gait and mobility: Secondary | ICD-10-CM | POA: Diagnosis not present

## 2020-06-06 DIAGNOSIS — M6281 Muscle weakness (generalized): Secondary | ICD-10-CM | POA: Diagnosis not present

## 2020-06-06 DIAGNOSIS — I69328 Other speech and language deficits following cerebral infarction: Secondary | ICD-10-CM | POA: Diagnosis not present

## 2020-06-06 DIAGNOSIS — G459 Transient cerebral ischemic attack, unspecified: Secondary | ICD-10-CM | POA: Diagnosis not present

## 2020-06-06 DIAGNOSIS — I69891 Dysphagia following other cerebrovascular disease: Secondary | ICD-10-CM | POA: Diagnosis not present

## 2020-06-06 DIAGNOSIS — I69828 Other speech and language deficits following other cerebrovascular disease: Secondary | ICD-10-CM | POA: Diagnosis not present

## 2020-06-06 NOTE — Patient Outreach (Signed)
[facility] Post- Acute Care Coordinator follow up. Member screened for potential [facility] Care Management needs as a benefit of [place] [employer].  Update received from [place] indicating Mrs. Amsler is a long term resident of [facility].   No identifiable [facility] Care Management needs at this time. Will alert [facility] Care Management RNCM.   Marthenia Rolling, MSN, RN,BSN [place] Acute Care Coordinator [phone] [facility]) [phone]  (Toll free office)

## 2020-06-06 NOTE — Patient Outreach (Signed)
Member screened for potential [facility] Care Management needs as a benefit of [place] [employer].  Per [place] Patient [name] member resides in [place]. Appears member is under private pay for LTC.   Communication sent to [facility] SW to confirm member's status at facility.    Marthenia Rolling, MSN, RN,BSN [place] Acute Care Coordinator [phone] [facility]) [phone]  (Toll free office)

## 2020-06-07 DIAGNOSIS — I482 Chronic atrial fibrillation, unspecified: Secondary | ICD-10-CM | POA: Diagnosis not present

## 2020-06-07 DIAGNOSIS — R2689 Other abnormalities of gait and mobility: Secondary | ICD-10-CM | POA: Diagnosis not present

## 2020-06-07 DIAGNOSIS — I69891 Dysphagia following other cerebrovascular disease: Secondary | ICD-10-CM | POA: Diagnosis not present

## 2020-06-07 DIAGNOSIS — G459 Transient cerebral ischemic attack, unspecified: Secondary | ICD-10-CM | POA: Diagnosis not present

## 2020-06-07 DIAGNOSIS — I69828 Other speech and language deficits following other cerebrovascular disease: Secondary | ICD-10-CM | POA: Diagnosis not present

## 2020-06-07 DIAGNOSIS — I69328 Other speech and language deficits following cerebral infarction: Secondary | ICD-10-CM | POA: Diagnosis not present

## 2020-06-07 DIAGNOSIS — M6281 Muscle weakness (generalized): Secondary | ICD-10-CM | POA: Diagnosis not present

## 2020-06-10 ENCOUNTER — Non-Acute Institutional Stay (SKILLED_NURSING_FACILITY): Payer: Medicare Other | Admitting: Orthopedic Surgery

## 2020-06-10 ENCOUNTER — Encounter: Payer: Self-pay | Admitting: Orthopedic Surgery

## 2020-06-10 DIAGNOSIS — R531 Weakness: Secondary | ICD-10-CM

## 2020-06-10 DIAGNOSIS — I5032 Chronic diastolic (congestive) heart failure: Secondary | ICD-10-CM

## 2020-06-10 DIAGNOSIS — G459 Transient cerebral ischemic attack, unspecified: Secondary | ICD-10-CM

## 2020-06-10 DIAGNOSIS — E441 Mild protein-calorie malnutrition: Secondary | ICD-10-CM | POA: Diagnosis not present

## 2020-06-10 DIAGNOSIS — L853 Xerosis cutis: Secondary | ICD-10-CM | POA: Diagnosis not present

## 2020-06-10 DIAGNOSIS — I10 Essential (primary) hypertension: Secondary | ICD-10-CM

## 2020-06-10 DIAGNOSIS — F0392 Unspecified dementia, unspecified severity, with psychotic disturbance: Secondary | ICD-10-CM

## 2020-06-10 DIAGNOSIS — F0391 Unspecified dementia with behavioral disturbance: Secondary | ICD-10-CM | POA: Diagnosis not present

## 2020-06-10 DIAGNOSIS — M6281 Muscle weakness (generalized): Secondary | ICD-10-CM | POA: Diagnosis not present

## 2020-06-10 DIAGNOSIS — I69328 Other speech and language deficits following cerebral infarction: Secondary | ICD-10-CM | POA: Diagnosis not present

## 2020-06-10 DIAGNOSIS — K089 Disorder of teeth and supporting structures, unspecified: Secondary | ICD-10-CM

## 2020-06-10 DIAGNOSIS — H5213 Myopia, bilateral: Secondary | ICD-10-CM | POA: Diagnosis not present

## 2020-06-10 DIAGNOSIS — I4811 Longstanding persistent atrial fibrillation: Secondary | ICD-10-CM | POA: Diagnosis not present

## 2020-06-10 DIAGNOSIS — R2689 Other abnormalities of gait and mobility: Secondary | ICD-10-CM | POA: Diagnosis not present

## 2020-06-10 DIAGNOSIS — I69828 Other speech and language deficits following other cerebrovascular disease: Secondary | ICD-10-CM | POA: Diagnosis not present

## 2020-06-10 DIAGNOSIS — I69891 Dysphagia following other cerebrovascular disease: Secondary | ICD-10-CM | POA: Diagnosis not present

## 2020-06-10 NOTE — Progress Notes (Signed)
Location:    [place] Room Number: 205/D Place of Service:  SNF (31) Provider: Shayla Hendricks AGNP-C  Megan Curry, DO  Patient Care Team: Megan Curry, DO as PCP - General (Geriatric Medicine) [name], Megan Bucks, NP as Nurse Practitioner (Family Medicine) Rehab, [name] ([place])  Extended Emergency Contact Information Primary Emergency Contact: [name],[name] Address: [address]          [place], [place] [address] [name] of [place] Phone: [phone] Mobile Phone: [phone] Relation: Daughter  Code Status:  DNR Goals of care: Advanced Directive information Advanced Directives 06/10/2020  Does Patient Have a Medical Advance Directive? Yes  Type of Advance Directive Out of facility DNR (pink MOST or yellow form)  Does patient want to make changes to medical advance directive? No - Patient declined  Copy of [place] in Chart? -  Would patient like information on creating a medical advance directive? -  Pre-existing out of facility DNR order (yellow form or pink MOST form) Yellow form placed in chart (order not valid for inpatient use)     Chief Complaint  Patient presents with  . Medical Management of Chronic Issues    Routine Visit of Medical Mangement  . Quality Metric Gaps    Dexa Sacn    HPI:  Pt is a 86 y.o. female seen today for medical management of chronic diseases.    She is a resident of [facility], seen today in room. Past medical history includes: hypertension, atrial fibrillation, transient ischemic attack, coronary artery disease, congestive heart failure, asthma, left sided weakness, chronic renal insufficiency stage II, constipation, and chronic hip pain.   Today she is sitting in her wheelchair eating lunch. She is alert, follows commands, and responds to questions appropriately. Forgot Thanksgiving was 2 weeks ago, thinks she  may have been in the hospital.  On 11/22, she was sent to [facility] for left sided weakness. Facility nurse noticed acute left sided weakness and facial droop. She was hospitalized for two days and worked up for stroke. Urine, electrolytes, blood counts and EKG were unremarkable. CT head was unremarkable. She was discharged back to [facility] 11/24 and advised to follow up with PCP in 1 week, follow up with [facility] in 6 weeks, assure INR goal of 2.5, and consider changing anticoagulant to DOAC.   Since hospitalization, her functional ability is almost back to baseline. Physical therapist, Jackelyn Poling, reports she is minimal assist with front wheeled walker during transfers. Ambulates with wheelchair, no assistance required. Patient denies any pain today. Also followed by OP and speech therapy. No recent falls or injuries reported.   Recent blood pressure reading are as follows:   12/6- 134/76  12/6- 138/78  12/5- 120/62  12/5- 153/64  Recent weights are as follows:   12/3- 131 lbs  11/30- 132.2 lbs  11/29- 130.6 lbs  She claims her eye sight is poor. Unsure of when her eyes were last checked. Has trouble seeing up close and far away. Requesting to see eye doctor for checkup.   She claims her appetite is poor. Tries to eat one good meal daily. Drinks her ensure shakes 2-3 times daily.   Facility nurse does not report any concerns, vitals stable. Patient denies shortness of breath and chest pain.      Past Medical History:  Diagnosis Date  . Acute encephalopathy   . Acute renal failure  superimposed on stage 3 chronic kidney disease ([place])   . Anemia   . Atrial fibrillation ([place])   . Colon cancer ([place]) 12/11  . Colon cancer ([place]) 2011  . Diarrhea   . DVT (deep venous thrombosis) ([place]) 11/06  . Elevated brain natriuretic peptide (BNP) level   . Frequent headaches   . Heart murmur   . Hyperkalemia   . Hypertension   . Pulmonary embolism ([place]) 11/06  . Seizures ([place])    . Stroke ([place]) 11/30/2014  . Urine retention 01/2019   Past Surgical History:  Procedure Laterality Date  . APPENDECTOMY  1952  . COLECTOMY  06/2010   partial, Dr. Donne Hazel complicated by LLE CVT; S/P IVC umbrella & anemia  . HIP FRACTURE SURGERY  2006   Trauma  . TOTAL ABDOMINAL HYSTERECTOMY W/ BILATERAL SALPINGOOPHORECTOMY  1973   For Fibroids  . TOTAL HIP ARTHROPLASTY  01/03/07   Left hip replacement.  Marland Kitchen VENA CAVA FILTER PLACEMENT  06/28/2010    No Known Allergies  Allergies as of 06/10/2020   No Known Allergies     Medication List       Accurate as of June 10, 2020  9:00 AM. If you have any questions, ask your nurse or doctor.        acetaminophen 500 MG tablet Commonly known as: TYLENOL Take 1,000 mg by mouth every 8 (eight) hours as needed for headache (pain).   albuterol 1.25 MG/3ML nebulizer solution Commonly known as: ACCUNEB Take 2.5 mg by nebulization every 6 (six) hours as needed for wheezing or shortness of breath.   albuterol 108 (90 Base) MCG/ACT inhaler Commonly known as: VENTOLIN HFA Inhale 2 puffs into the lungs every 6 (six) hours as needed for wheezing or shortness of breath.   Anusol-HC 2.5 % rectal cream Generic drug: hydrocortisone Place 1 application rectally 2 (two) times daily as needed for hemorrhoids or anal itching.   aspirin 81 MG chewable tablet Chew 81 mg by mouth daily.   atorvastatin 20 MG tablet Commonly known as: LIPITOR Take 1 tablet (20 mg total) by mouth daily.   Dulcolax 10 MG suppository Generic drug: bisacodyl Place 10 mg rectally as needed for moderate constipation.   Ensure Take 237 mLs by mouth in the morning, at noon, and at bedtime.   NUTRITIONAL SUPPLEMENT PO Take 1 each by mouth. Magic Cup with L/D meal   escitalopram 10 MG tablet Commonly known as: LEXAPRO Take 10 mg by mouth daily.   guaiFENesin 600 MG 12 hr tablet Commonly known as: MUCINEX Take 600 mg by mouth 2 (two) times daily.     ipratropium-albuterol 0.5-2.5 (3) MG/3ML Soln Commonly known as: DUONEB Take 3 mLs by nebulization every 6 (six) hours. For coughing, wheezing, and shortness of breath.   isosorbide mononitrate 30 MG 24 hr tablet Commonly known as: IMDUR Take 15 mg by mouth daily.   loratadine 10 MG tablet Commonly known as: CLARITIN Take 10 mg by mouth daily.   Metoprolol Tartrate 37.5 MG Tabs Take 1 tablet by mouth 2 (two) times daily.   mometasone-formoterol 100-5 MCG/ACT Aero Commonly known as: DULERA Inhale 2 puffs into the lungs 2 (two) times daily. Gargle and rinse your mouth with water after each use of this medication to help prevent dryness,   multivitamin with minerals Tabs tablet Take 1 tablet by mouth daily.   NON FORMULARY Diet order: Regular/thin diet, Continue Liberalized diet per dietary   OYSTER SHELL/VITAMIN D PO Take 1 tablet by  mouth daily. With breakfast   polyethylene glycol 17 g packet Commonly known as: MIRALAX / GLYCOLAX Take 17 g by mouth daily as needed.   promethazine 12.5 MG tablet Commonly known as: PHENERGAN Take 12.5 mg by mouth 3 (three) times daily as needed for nausea or vomiting.   QUEtiapine 50 MG tablet Commonly known as: SEROQUEL Take 50 mg by mouth at bedtime.   tamsulosin 0.4 MG Caps capsule Commonly known as: FLOMAX Take 1 capsule (0.4 mg total) by mouth daily.   verapamil 120 MG 24 hr capsule Commonly known as: VERELAN PM Take 120 mg by mouth at bedtime.   warfarin 2.5 MG tablet Commonly known as: COUMADIN Take as directed by the anticoagulation clinic. If you are unsure how to take this medication, talk to your nurse or doctor. Original instructions: Take 2.5 mg by mouth daily. What changed: Another medication with the same name was removed. Continue taking this medication, and follow the directions you see here. Changed by: Yvonna Alanis, NP       Review of Systems  Constitutional: Negative for activity change, appetite change  and fever.  HENT: Negative for dental problem, hearing loss and trouble swallowing.        Missing teeth  Eyes: Positive for visual disturbance.  Respiratory: Negative for cough and shortness of breath.   Cardiovascular: Negative for chest pain and leg swelling.  Gastrointestinal: Negative for abdominal pain, constipation and diarrhea.  Genitourinary: Positive for frequency. Negative for dysuria and hematuria.  Musculoskeletal: Positive for arthralgias and joint swelling.       Ambulates with wheelchair  Skin:        dry skin  Neurological: Positive for weakness. Negative for dizziness, facial asymmetry and numbness.  Hematological: Bruises/bleeds easily.  Psychiatric/Behavioral: Negative for confusion, dysphoric mood and sleep disturbance. The patient is not nervous/anxious.     Immunization History  Administered Date(s) Administered  . Fluad Quad(high Dose 65+) 03/07/2019  . Influenza Split 03/30/2012, 03/07/2019  . Influenza Whole 04/30/2006, 04/20/2008, 04/15/2009  . Influenza, High Dose Seasonal PF 04/20/2013, 03/27/2015, 03/31/2016, 04/22/2017, 04/20/2018  . Influenza,inj,Quad PF,6+ Mos 05/23/2014  . Influenza-Unspecified 04/23/2020  . Moderna SARS-COVID-2 Vaccination 07/06/2019, 08/03/2019  . Pneumococcal Conjugate-13 04/24/2015  . Pneumococcal Polysaccharide-23 07/06/2009  . Td 10/30/2016   Pertinent  Health Maintenance Due  Topic Date Due  . DEXA SCAN  Never done  . INFLUENZA VACCINE  Completed  . PNA vac Low Risk Adult  Completed   Fall Risk  06/07/2020 07/22/2018 01/18/2018 10/30/2016 08/14/2015  Falls in the past year? - 1 No No No  Number falls in past yr: - 1 - - -  Injury with Fall? - 0 - - -  Risk for fall due to : History of fall(s);Impaired balance/gait;Impaired mobility;Mental status change - - - -  Follow up Falls evaluation completed;Education provided;Falls prevention discussed - - - -   Functional Status Survey:    Vitals:   06/10/20 0851  BP: 134/76   Pulse: 74  Resp: 17  Temp: (!) 97.3 F (36.3 C)  Weight: 131 lb (59.4 kg)  Height: 5\' 3"  (1.6 m)   Body mass index is 23.21 kg/m. Physical Exam Constitutional:      General: She is not in acute distress. HENT:     Head: Normocephalic.     Right Ear: There is no impacted cerumen.     Left Ear: There is no impacted cerumen.     Nose: Nose normal.  Mouth/Throat:     Mouth: Mucous membranes are moist.     Dentition: Abnormal dentition.     Pharynx: No posterior oropharyngeal erythema.  Eyes:     Extraocular Movements: Extraocular movements intact.     Pupils: Pupils are equal, round, and reactive to light.  Cardiovascular:     Rate and Rhythm: Normal rate. Rhythm irregular.     Pulses: Normal pulses.     Heart sounds: Normal heart sounds. No murmur heard.   Pulmonary:     Effort: Pulmonary effort is normal.     Breath sounds: Normal breath sounds.  Abdominal:     General: Bowel sounds are normal. There is no distension.     Palpations: Abdomen is soft.     Tenderness: There is no abdominal tenderness.  Musculoskeletal:     Cervical back: No tenderness.     Right lower leg: No edema.     Left lower leg: No edema.  Lymphadenopathy:     Cervical: No cervical adenopathy.  Skin:    General: Skin is warm and dry.     Capillary Refill: Capillary refill takes less than 2 seconds.     Comments: Scattered bruising to bilateral forearms, skin dry in appearance.   Neurological:     General: No focal deficit present.     Mental Status: She is alert and oriented to person, place, and time. Mental status is at baseline.     Motor: Weakness present.     Gait: Gait abnormal.  Psychiatric:        Mood and Affect: Mood normal.        Behavior: Behavior normal.     Labs reviewed: Recent Labs    03/22/20 0000 03/22/20 0000 05/27/20 0846 05/27/20 0847 05/29/20 0220  NA 146  --  147* 144 143  K 3.9   < > 3.9 4.0 4.4  CL 109*   < > 115* 117* 112*  CO2 23*  --   --  19* 22   GLUCOSE  --   --  69* 71 89  BUN 30*  --  34* 28* 24*  CREATININE 1.2*  --  0.80 0.91 0.84  CALCIUM 8.7  --   --  7.2* 8.6*   < > = values in this interval not displayed.   Recent Labs    12/07/19 0000 05/27/20 0847  AST 15 21  ALT 6* 10  ALKPHOS  --  44  BILITOT  --  0.8  PROT  --  4.8*  ALBUMIN 4.3 2.6*   Recent Labs    08/11/19 0000 08/11/19 0000 09/07/19 0000 11/09/19 0000 12/07/19 0000 12/07/19 0000 03/22/20 0000 05/27/20 0846 05/27/20 0912  WBC 5.2   < > 3.4   < > 6.6  --  3.9  --  5.8  NEUTROABS 4  --  2  --   --   --   --   --  4.1  HGB 12.4   < > 11.9*   < > 11.8*   < > 12.3 11.2* 12.9  HCT 37   < > 36   < > 36   < > 38 33.0* 42.3  MCV  --   --   --   --   --   --   --   --  96.6  PLT 69*   < > 63*  --   --   --  59*  --  94*   < > =  values in this interval not displayed.   Lab Results  Component Value Date   TSH 3.58 08/22/2018   Lab Results  Component Value Date   HGBA1C 5.6 05/27/2020   Lab Results  Component Value Date   CHOL 95 05/27/2020   HDL 32 (L) 05/27/2020   LDLCALC 55 05/27/2020   TRIG 39 05/27/2020   CHOLHDL 3.0 05/27/2020    Significant Diagnostic Results in last 30 days:  DG Abd 1 View  Result Date: 05/27/2020 CLINICAL DATA:  Abdominal pain and nausea for 3 days. EXAM: ABDOMEN - 1 VIEW COMPARISON:  Chest and two views abdomen 06/20/2010. FINDINGS: There is a very large volume of stool throughout the colon. The bowel gas pattern is nonobstructive. No unexpected abdominal calcification is seen. IVC filter and left hip replacement noted. The patient has convex left lumbar scoliosis. Bones are osteopenic. IMPRESSION: No acute finding. Large volume of stool throughout the colon. Electronically Signed   By: Inge Rise M.D.   On: 05/27/2020 14:32   CT HEAD WO CONTRAST  Result Date: 05/28/2020 CLINICAL DATA:  Stroke follow-up EXAM: CT HEAD WITHOUT CONTRAST TECHNIQUE: Contiguous axial images were obtained from the base of the skull  through the vertex without intravenous contrast. COMPARISON:  05/27/2020 FINDINGS: Brain: There is no mass, hemorrhage or extra-axial collection. There is generalized atrophy without lobar predilection. Hypodensity of the white matter is most commonly associated with chronic microvascular disease. Vascular: Atherosclerotic calcification of the vertebral and internal carotid arteries at the skull base. No abnormal hyperdensity of the major intracranial arteries or dural venous sinuses. Skull: The visualized skull base, calvarium and extracranial soft tissues are normal. Sinuses/Orbits: Moderate opacification of the right frontal, ethmoid and sphenoid sinuses. The orbits are normal. IMPRESSION: 1. Generalized atrophy and chronic microvascular ischemia without acute intracranial abnormality. 2. Right-sided paranasal sinus disease. Electronically Signed   By: Ulyses Jarred M.D.   On: 05/28/2020 22:51   ECHOCARDIOGRAM COMPLETE  Result Date: 05/27/2020    ECHOCARDIOGRAM REPORT   Patient Name:   [name] Date of Exam: 05/27/2020 Medical Rec #:  [id number]     Height:       63.0 in Accession #:    [id number]    Weight:       142.9 lb Date of Birth:  21-Jul-1926     BSA:          1.676 m Patient Age:    8 years      BP:           126/71 mmHg Patient Gender: F             HR:           72 bpm. Exam Location:  Inpatient Procedure: 2D Echo, Cardiac Doppler and Color Doppler Indications:    TIA  History:        Patient has prior history of Echocardiogram examinations.                 Arrythmias:Atrial Fibrillation. H/O DVT and pulmonary embolus.                 S/P IVC filter. CKD.  Sonographer:    Clayton Lefort RDCS (AE) Referring Phys: [id number] RONDELL A SMITH IMPRESSIONS  1. Left ventricular ejection fraction, by estimation, is 60 to 65%. The left ventricle has normal function. The left ventricle has no regional wall motion abnormalities. There is severe left ventricular hypertrophy. Left ventricular diastolic function  could not be evaluated.  2. Right ventricular systolic function is low normal. The right ventricular size is normal. There is moderately elevated pulmonary artery systolic pressure. The estimated right ventricular systolic pressure is 96.0 mmHg.  3. Left atrial size was severely dilated.  4. Right atrial size was severely dilated.  5. The mitral valve is abnormal. Mild to moderate mitral valve regurgitation.  6. Tricuspid valve regurgitation is moderate to severe.  7. The aortic valve is abnormal. Aortic valve regurgitation is trivial. Mild to moderate aortic valve sclerosis/calcification is present, without any evidence of aortic stenosis.  8. The inferior vena cava is normal in size with <50% respiratory variability, suggesting right atrial pressure of 8 mmHg. Comparison(s): No prior Echocardiogram. FINDINGS  Left Ventricle: Left ventricular ejection fraction, by estimation, is 60 to 65%. The left ventricle has normal function. The left ventricle has no regional wall motion abnormalities. The left ventricular internal cavity size was normal in size. There is  severe left ventricular hypertrophy. Left ventricular diastolic function could not be evaluated due to atrial fibrillation. Left ventricular diastolic function could not be evaluated. Right Ventricle: The right ventricular size is normal. No increase in right ventricular wall thickness. Right ventricular systolic function is low normal. There is moderately elevated pulmonary artery systolic pressure. The tricuspid regurgitant velocity  is 3.56 m/s, and with an assumed right atrial pressure of 8 mmHg, the estimated right ventricular systolic pressure is 45.4 mmHg. Left Atrium: Left atrial size was severely dilated. Right Atrium: Right atrial size was severely dilated. Pericardium: There is no evidence of pericardial effusion. Mitral Valve: The mitral valve is abnormal. Mild to moderate mitral annular calcification. Mild to moderate mitral valve regurgitation.  Tricuspid Valve: The tricuspid valve is grossly normal. Tricuspid valve regurgitation is moderate to severe. Aortic Valve: The aortic valve is abnormal. Aortic valve regurgitation is trivial. Mild to moderate aortic valve sclerosis/calcification is present, without any evidence of aortic stenosis. Aortic valve mean gradient measures 5.0 mmHg. Aortic valve peak gradient measures 10.0 mmHg. Aortic valve area, by VTI measures 1.34 cm. Pulmonic Valve: The pulmonic valve was normal in structure. Pulmonic valve regurgitation is not visualized. Aorta: The aortic root and ascending aorta are structurally normal, with no evidence of dilitation. Venous: The inferior vena cava is normal in size with less than 50% respiratory variability, suggesting right atrial pressure of 8 mmHg. IAS/Shunts: No atrial level shunt detected by color flow Doppler.  LEFT VENTRICLE PLAX 2D LVIDd:         4.50 cm LVIDs:         3.00 cm LV PW:         1.70 cm LV IVS:        1.70 cm LVOT diam:     1.80 cm LV SV:         45 LV SV Index:   27 LVOT Area:     2.54 cm  RIGHT VENTRICLE            IVC RV Basal diam:  3.30 cm    IVC diam: 1.70 cm RV S prime:     9.57 cm/s TAPSE (M-mode): 1.8 cm LEFT ATRIUM              Index       RIGHT ATRIUM           Index LA diam:        4.90 cm  2.92 cm/m  RA Area:     26.70 cm LA Vol (A2C):   143.0  ml 85.32 ml/m RA Volume:   84.40 ml  50.36 ml/m LA Vol (A4C):   124.0 ml 73.99 ml/m LA Biplane Vol: 135.0 ml 80.55 ml/m  AORTIC VALVE AV Area (Vmax):    1.22 cm AV Area (Vmean):   1.25 cm AV Area (VTI):     1.34 cm AV Vmax:           158.00 cm/s AV Vmean:          108.000 cm/s AV VTI:            0.333 m AV Peak Grad:      10.0 mmHg AV Mean Grad:      5.0 mmHg LVOT Vmax:         76.00 cm/s LVOT Vmean:        53.200 cm/s LVOT VTI:          0.176 m LVOT/AV VTI ratio: 0.53  AORTA Ao Root diam: 3.10 cm Ao Asc diam:  2.70 cm TRICUSPID VALVE TR Peak grad:   50.7 mmHg TR Vmax:        356.00 cm/s  SHUNTS Systemic VTI:   0.18 m Systemic Diam: 1.80 cm Lyman Bishop MD Electronically signed by Lyman Bishop MD Signature Date/Time: 05/27/2020/5:12:51 PM    Final    CT HEAD CODE STROKE WO CONTRAST  Result Date: 05/27/2020 CLINICAL DATA:  Code stroke.  Neuro deficit, acute, stroke suspected EXAM: CT HEAD WITHOUT CONTRAST TECHNIQUE: Contiguous axial images were obtained from the base of the skull through the vertex without intravenous contrast. COMPARISON:  06/01/2018 head CT and prior. FINDINGS: Please note image quality is mildly degraded by motion artifact. Brain: No intracranial hemorrhage. Small left basal ganglia hypodensity, new since prior exam (6:15). Left posterior cranial fossa arachnoid cyst, unchanged. No midline shift, ventriculomegaly or extra-axial fluid collection. Diffuse cerebral volume loss with ex vacuo dilatation. Confluent supratentorial white matter hypodensities are unchanged. Chronic right basal ganglia lacunar insult versus dilated perivascular space is unchanged. Vascular: No hyperdense vessel. Bilateral skull base atherosclerotic calcifications. Skull: Negative for fracture or focal lesion. Sinuses/Orbits: No acute orbital finding. Mild pansinus mucosal thickening. Other: None. ASPECTS [facility] Stroke Program Early CT Score) - Ganglionic level infarction (caudate, lentiform nuclei, internal capsule, insula, M1-M3 cortex): 6 - Supraganglionic infarction (M4-M6 cortex): 3 Total score (0-10 with 10 being normal): 9 IMPRESSION: 1. Small age indeterminate left basal ganglia hypodensity may reflect sequela of prior insult versus dilated perivascular space. 2. Chronic right basal gangliar lacunar insult. 3. Age-related cerebral atrophy and chronic microvascular ischemic changes. 4. ASPECTS is 9 Code stroke imaging results were communicated on 05/27/2020 at 9:11 am to provider Dr. Lorrin Goodell via secure text paging. Electronically Signed   By: Primitivo Gauze M.D.   On: 05/27/2020 09:15   VAS US CAROTID (at  [facility] and [facility] only)  Result Date: 05/27/2020 Carotid Arterial Duplex Study Indications:       TIA. Risk Factors:      Hypertension, prior CVA. Comparison Study:  no prior Performing Technologist: Abram Sander RVS  Examination Guidelines: A complete evaluation includes B-mode imaging, spectral Doppler, color Doppler, and power Doppler as needed of all accessible portions of each vessel. Bilateral testing is considered an integral part of a complete examination. Limited examinations for reoccurring indications may be performed as noted.  Right Carotid Findings: +----------+--------+--------+--------+------------------+--------+           PSV cm/sEDV cm/sStenosisPlaque DescriptionComments +----------+--------+--------+--------+------------------+--------+ CCA Prox  57      11  heterogenous               +----------+--------+--------+--------+------------------+--------+ CCA Distal52      11              heterogenous               +----------+--------+--------+--------+------------------+--------+ ICA Prox  62      11      1-39%   heterogenous               +----------+--------+--------+--------+------------------+--------+ ICA Distal35      6                                          +----------+--------+--------+--------+------------------+--------+ ECA       99                                                 +----------+--------+--------+--------+------------------+--------+ +----------+--------+-------+--------+-------------------+           PSV cm/sEDV cmsDescribeArm Pressure (mmHG) +----------+--------+-------+--------+-------------------+ [id number]                                         +----------+--------+-------+--------+-------------------+ +---------+--------+--+--------+-+---------+ VertebralPSV cm/s32EDV cm/s5Antegrade +---------+--------+--+--------+-+---------+  Left Carotid Findings:  +----------+--------+--------+--------+------------------+--------+           PSV cm/sEDV cm/sStenosisPlaque DescriptionComments +----------+--------+--------+--------+------------------+--------+ CCA Prox  67      8               heterogenous               +----------+--------+--------+--------+------------------+--------+ CCA Distal53      13              heterogenous               +----------+--------+--------+--------+------------------+--------+ ICA Prox  64      10      1-39%   heterogenous               +----------+--------+--------+--------+------------------+--------+ ICA Distal44      9                                          +----------+--------+--------+--------+------------------+--------+ ECA       85                                                 +----------+--------+--------+--------+------------------+--------+ +----------+--------+--------+--------+-------------------+           PSV cm/sEDV cm/sDescribeArm Pressure (mmHG) +----------+--------+--------+--------+-------------------+ [id number]                                          +----------+--------+--------+--------+-------------------+ +---------+--------+--+--------+-+---------+ VertebralPSV cm/s40EDV cm/s7Antegrade +---------+--------+--+--------+-+---------+   Summary:   *See table(s) above for measurements and observations.  Electronically signed by Monica Martinez MD on 05/27/2020 at 4:52:39 PM.    Final     Assessment/Plan 1. TIA (transient ischemic attack) -  stable at this time, last INR 1.3, goal 2.5-3.5 - increase coumadin to 2.5 mg daily, recheck INR 12/7 - consider discussing DOAC with family  - continue asa, lipitor and adequate bp control for prevention - follow up with [facility]rology 07/11/20 @0845  AM  2. Chronic diastolic congestive heart failure (HCC) -stable, she denies trouble breathing - on exam lung sound clear, no edema in ankles, no reported  weight gain - continue bp control with metoprolol  3. Mild protein-calorie malnutrition ([place]) - stable, she is maintaining her weight even though appetite is poor - continue ensure shakes TID daily  4. Generalized weakness - continue skilled nursing services to help with ADL's and wheelchair  5. Essential hypertension - stable, at goal <150/90 - continue metoprolol regimen - continue to limit sodium in diet  6. Dementia with psychosis ([place]) - she is very pleasant, follows commands, somewhat forgetful about Thanksgiving - facility nurse denies any outbursts or hallucinations - continue Seroquel regimen  7. Longstanding persistent atrial fibrillation (HCC) - stable, rate controlled with verapamil - continue asa and coumadin regimen - PT/INR scheduled to be rechecked 06/11/20 - avoid foods high in vitamin K  8. Dry skin - ongoing, skin appears flaky on extremities - suggest applying lotion to skin after bathing daily  9. Myopia of both eyes - stable, can read words close up, font needs to be big - suggest readers or magnifying glass - will ask facility nurse when last eye exam was done  10. Poor dentition  - stable, she has numerous teeth missing on exam, teeth also discolored - she denies any dental pain or trouble eating - continue diet in soft foods  .  Family/ staff Communication:  Plan discussed with patient and facility nurse  Labs/tests ordered:  none

## 2020-06-11 DIAGNOSIS — I69328 Other speech and language deficits following cerebral infarction: Secondary | ICD-10-CM | POA: Diagnosis not present

## 2020-06-11 DIAGNOSIS — I69828 Other speech and language deficits following other cerebrovascular disease: Secondary | ICD-10-CM | POA: Diagnosis not present

## 2020-06-11 DIAGNOSIS — I482 Chronic atrial fibrillation, unspecified: Secondary | ICD-10-CM | POA: Diagnosis not present

## 2020-06-11 DIAGNOSIS — R2689 Other abnormalities of gait and mobility: Secondary | ICD-10-CM | POA: Diagnosis not present

## 2020-06-11 DIAGNOSIS — G459 Transient cerebral ischemic attack, unspecified: Secondary | ICD-10-CM | POA: Diagnosis not present

## 2020-06-11 DIAGNOSIS — I69891 Dysphagia following other cerebrovascular disease: Secondary | ICD-10-CM | POA: Diagnosis not present

## 2020-06-11 DIAGNOSIS — M6281 Muscle weakness (generalized): Secondary | ICD-10-CM | POA: Diagnosis not present

## 2020-06-12 DIAGNOSIS — I69891 Dysphagia following other cerebrovascular disease: Secondary | ICD-10-CM | POA: Diagnosis not present

## 2020-06-12 DIAGNOSIS — I69828 Other speech and language deficits following other cerebrovascular disease: Secondary | ICD-10-CM | POA: Diagnosis not present

## 2020-06-12 DIAGNOSIS — M6281 Muscle weakness (generalized): Secondary | ICD-10-CM | POA: Diagnosis not present

## 2020-06-12 DIAGNOSIS — G459 Transient cerebral ischemic attack, unspecified: Secondary | ICD-10-CM | POA: Diagnosis not present

## 2020-06-12 DIAGNOSIS — I69328 Other speech and language deficits following cerebral infarction: Secondary | ICD-10-CM | POA: Diagnosis not present

## 2020-06-12 DIAGNOSIS — R2689 Other abnormalities of gait and mobility: Secondary | ICD-10-CM | POA: Diagnosis not present

## 2020-06-13 DIAGNOSIS — I69891 Dysphagia following other cerebrovascular disease: Secondary | ICD-10-CM | POA: Diagnosis not present

## 2020-06-13 DIAGNOSIS — R2689 Other abnormalities of gait and mobility: Secondary | ICD-10-CM | POA: Diagnosis not present

## 2020-06-13 DIAGNOSIS — M6281 Muscle weakness (generalized): Secondary | ICD-10-CM | POA: Diagnosis not present

## 2020-06-13 DIAGNOSIS — I69828 Other speech and language deficits following other cerebrovascular disease: Secondary | ICD-10-CM | POA: Diagnosis not present

## 2020-06-13 DIAGNOSIS — G459 Transient cerebral ischemic attack, unspecified: Secondary | ICD-10-CM | POA: Diagnosis not present

## 2020-06-13 DIAGNOSIS — I69328 Other speech and language deficits following cerebral infarction: Secondary | ICD-10-CM | POA: Diagnosis not present

## 2020-06-14 DIAGNOSIS — I69891 Dysphagia following other cerebrovascular disease: Secondary | ICD-10-CM | POA: Diagnosis not present

## 2020-06-14 DIAGNOSIS — M6281 Muscle weakness (generalized): Secondary | ICD-10-CM | POA: Diagnosis not present

## 2020-06-14 DIAGNOSIS — I69828 Other speech and language deficits following other cerebrovascular disease: Secondary | ICD-10-CM | POA: Diagnosis not present

## 2020-06-14 DIAGNOSIS — G459 Transient cerebral ischemic attack, unspecified: Secondary | ICD-10-CM | POA: Diagnosis not present

## 2020-06-14 DIAGNOSIS — I69328 Other speech and language deficits following cerebral infarction: Secondary | ICD-10-CM | POA: Diagnosis not present

## 2020-06-14 DIAGNOSIS — R2689 Other abnormalities of gait and mobility: Secondary | ICD-10-CM | POA: Diagnosis not present

## 2020-06-17 DIAGNOSIS — I69328 Other speech and language deficits following cerebral infarction: Secondary | ICD-10-CM | POA: Diagnosis not present

## 2020-06-17 DIAGNOSIS — M6281 Muscle weakness (generalized): Secondary | ICD-10-CM | POA: Diagnosis not present

## 2020-06-17 DIAGNOSIS — R2689 Other abnormalities of gait and mobility: Secondary | ICD-10-CM | POA: Diagnosis not present

## 2020-06-17 DIAGNOSIS — I69828 Other speech and language deficits following other cerebrovascular disease: Secondary | ICD-10-CM | POA: Diagnosis not present

## 2020-06-17 DIAGNOSIS — I69891 Dysphagia following other cerebrovascular disease: Secondary | ICD-10-CM | POA: Diagnosis not present

## 2020-06-17 DIAGNOSIS — G459 Transient cerebral ischemic attack, unspecified: Secondary | ICD-10-CM | POA: Diagnosis not present

## 2020-06-18 DIAGNOSIS — M6281 Muscle weakness (generalized): Secondary | ICD-10-CM | POA: Diagnosis not present

## 2020-06-18 DIAGNOSIS — G459 Transient cerebral ischemic attack, unspecified: Secondary | ICD-10-CM | POA: Diagnosis not present

## 2020-06-18 DIAGNOSIS — I69328 Other speech and language deficits following cerebral infarction: Secondary | ICD-10-CM | POA: Diagnosis not present

## 2020-06-18 DIAGNOSIS — R2689 Other abnormalities of gait and mobility: Secondary | ICD-10-CM | POA: Diagnosis not present

## 2020-06-18 DIAGNOSIS — I69828 Other speech and language deficits following other cerebrovascular disease: Secondary | ICD-10-CM | POA: Diagnosis not present

## 2020-06-18 DIAGNOSIS — I69891 Dysphagia following other cerebrovascular disease: Secondary | ICD-10-CM | POA: Diagnosis not present

## 2020-06-19 DIAGNOSIS — I69891 Dysphagia following other cerebrovascular disease: Secondary | ICD-10-CM | POA: Diagnosis not present

## 2020-06-19 DIAGNOSIS — R2689 Other abnormalities of gait and mobility: Secondary | ICD-10-CM | POA: Diagnosis not present

## 2020-06-19 DIAGNOSIS — M6281 Muscle weakness (generalized): Secondary | ICD-10-CM | POA: Diagnosis not present

## 2020-06-19 DIAGNOSIS — G459 Transient cerebral ischemic attack, unspecified: Secondary | ICD-10-CM | POA: Diagnosis not present

## 2020-06-19 DIAGNOSIS — I69828 Other speech and language deficits following other cerebrovascular disease: Secondary | ICD-10-CM | POA: Diagnosis not present

## 2020-06-19 DIAGNOSIS — I482 Chronic atrial fibrillation, unspecified: Secondary | ICD-10-CM | POA: Diagnosis not present

## 2020-06-19 DIAGNOSIS — I69328 Other speech and language deficits following cerebral infarction: Secondary | ICD-10-CM | POA: Diagnosis not present

## 2020-06-20 DIAGNOSIS — R2689 Other abnormalities of gait and mobility: Secondary | ICD-10-CM | POA: Diagnosis not present

## 2020-06-20 DIAGNOSIS — M6281 Muscle weakness (generalized): Secondary | ICD-10-CM | POA: Diagnosis not present

## 2020-06-20 DIAGNOSIS — G459 Transient cerebral ischemic attack, unspecified: Secondary | ICD-10-CM | POA: Diagnosis not present

## 2020-06-20 DIAGNOSIS — I69828 Other speech and language deficits following other cerebrovascular disease: Secondary | ICD-10-CM | POA: Diagnosis not present

## 2020-06-20 DIAGNOSIS — I69891 Dysphagia following other cerebrovascular disease: Secondary | ICD-10-CM | POA: Diagnosis not present

## 2020-06-20 DIAGNOSIS — I69328 Other speech and language deficits following cerebral infarction: Secondary | ICD-10-CM | POA: Diagnosis not present

## 2020-06-26 DIAGNOSIS — R791 Abnormal coagulation profile: Secondary | ICD-10-CM | POA: Diagnosis not present

## 2020-07-03 DIAGNOSIS — R791 Abnormal coagulation profile: Secondary | ICD-10-CM | POA: Diagnosis not present

## 2020-07-03 LAB — POCT INR: INR: 2.4 — AB (ref 0.9–1.1)

## 2020-07-08 ENCOUNTER — Encounter: Payer: Self-pay | Admitting: Orthopedic Surgery

## 2020-07-08 ENCOUNTER — Non-Acute Institutional Stay (SKILLED_NURSING_FACILITY): Payer: Medicare Other | Admitting: Orthopedic Surgery

## 2020-07-08 DIAGNOSIS — F0392 Unspecified dementia, unspecified severity, with psychotic disturbance: Secondary | ICD-10-CM

## 2020-07-08 DIAGNOSIS — R531 Weakness: Secondary | ICD-10-CM

## 2020-07-08 DIAGNOSIS — I10 Essential (primary) hypertension: Secondary | ICD-10-CM | POA: Diagnosis not present

## 2020-07-08 DIAGNOSIS — N182 Chronic kidney disease, stage 2 (mild): Secondary | ICD-10-CM

## 2020-07-08 DIAGNOSIS — I5032 Chronic diastolic (congestive) heart failure: Secondary | ICD-10-CM

## 2020-07-08 DIAGNOSIS — Z7901 Long term (current) use of anticoagulants: Secondary | ICD-10-CM | POA: Diagnosis not present

## 2020-07-08 DIAGNOSIS — F0391 Unspecified dementia with behavioral disturbance: Secondary | ICD-10-CM

## 2020-07-08 DIAGNOSIS — I4811 Longstanding persistent atrial fibrillation: Secondary | ICD-10-CM

## 2020-07-08 DIAGNOSIS — R410 Disorientation, unspecified: Secondary | ICD-10-CM

## 2020-07-08 NOTE — Progress Notes (Signed)
Location:   [facility]:   [facility] Provider:  Kittrell. Mariea Clonts, D.O., C.M.D.  Gayland Curry, DO  Patient Care Team: Gayland Curry, DO as PCP - General (Geriatric Medicine) Ngetich, Nelda Bucks, NP as Nurse Practitioner (Family Medicine) Rehab, [facility] ([place])  Extended Emergency Contact Information Primary Emergency Contact: [name],[name] Address: [address]          [place], [place] [address] [name] of [place] Phone: [phone] Mobile Phone: [phone] Relation: Daughter Secondary Emergency Contact: [name] Address: [address]          [place], [place] [address] [name] of [place] Phone: [phone] Mobile Phone: [phone] Relation: Daughter  Code Status:  DNR Goals of care: Advanced Directive information Advanced Directives 06/10/2020  Does Patient Have a Medical Advance Directive? Yes  Type of Advance Directive Out of facility DNR (pink MOST or yellow form)  Does patient want to make changes to medical advance directive? No - Patient declined  Copy of [place] in Chart? -  Would patient like information on creating a medical advance directive? -  Pre-existing out of facility DNR order (yellow form or pink MOST form) Yellow form placed in chart (order not valid for inpatient use)     No chief complaint on file.   HPI:  Pt is a 87 y.o. female seen today for medical management of chronic diseases.    She is a resident of [facility], seen at bedside today. PMH includes: hypertension, atrial fibrillation, TIA, CAD, pulmonary hypertension, congestive heart failure, CVA, asthma, left sided weakness, DDD, chronic renal insufficiency stage II, dyslipidemia, and generalized weakness.   Today she is sitting in her bed at the beginning of encounter. She is alert to self and  person, disoriented to time and place. Follows commands and can express needs. This morning, facility nurse reports increased confusion. Cadie tells me a story about someone stealing the money she saved in her cigar box. She also thinks the same person who stole her money, stole her roommates kidney. When I attempt to reorient her, she does not get upset, but appears sad I do not believe her. I asked her is she had any burning with urination, she states she was going more often. Facility nurse could not confirm this.   She was found on the floor beside her bed 12/30. No apparent injuries, normal neuro checks since incident.   She had been advised to follow up with Neurology 07/11/20, but appointment was cancelled at daughters request. States her mother becomes more confused and agitated when she is taken to out of facility appointments.   Recent blood pressures are as follows:  01/03- 137/64  01/02- 136/66  01/01- 132/59  She has steady weight gain within the last month, 12/28 (136.2 lbs), 11/29 (130.6 lbs).   Facility nurse does not report any other complaints besides increased confusion, vitals stable.      Past Medical History:  Diagnosis Date  . Acute encephalopathy   . Acute renal failure superimposed on stage 3 chronic kidney disease ([place])   . Anemia   . Atrial fibrillation ([place])   . Colon cancer ([place]) 12/11  . Colon cancer ([place]) 2011  . Diarrhea   . DVT (deep venous thrombosis) ([place]) 11/06  . Elevated brain natriuretic peptide (BNP) level   . Frequent headaches   . Heart murmur   .  Hyperkalemia   . Hypertension   . Pulmonary embolism (HCC) 11/06  . Seizures (HCC)   . Stroke (HCC) 11/30/2014  . Urine retention 01/2019   Past Surgical History:  Procedure Laterality Date  . APPENDECTOMY  1952  . COLECTOMY  06/2010   partial, Dr. Dwain Sarna complicated by LLE CVT; S/P IVC umbrella & anemia  . HIP FRACTURE SURGERY  2006   Trauma  . TOTAL ABDOMINAL HYSTERECTOMY W/ BILATERAL  SALPINGOOPHORECTOMY  1973   For Fibroids  . TOTAL HIP ARTHROPLASTY  01/03/07   Left hip replacement.  Marland Kitchen VENA CAVA FILTER PLACEMENT  06/28/2010    No Known Allergies  Outpatient Encounter Medications as of 07/08/2020  Medication Sig  . acetaminophen (TYLENOL) 500 MG tablet Take 1,000 mg by mouth every 8 (eight) hours as needed for headache (pain).   Marland Kitchen albuterol (ACCUNEB) 1.25 MG/3ML nebulizer solution Take 2.5 mg by nebulization every 6 (six) hours as needed for wheezing or shortness of breath.   Marland Kitchen albuterol (VENTOLIN HFA) 108 (90 Base) MCG/ACT inhaler Inhale 2 puffs into the lungs every 6 (six) hours as needed for wheezing or shortness of breath.  Marland Kitchen aspirin 81 MG chewable tablet Chew 81 mg by mouth daily.  Marland Kitchen atorvastatin (LIPITOR) 20 MG tablet Take 1 tablet (20 mg total) by mouth daily.  . bisacodyl (DULCOLAX) 10 MG suppository Place 10 mg rectally as needed for moderate constipation.  . Calcium Carbonate-Vitamin D (OYSTER SHELL/VITAMIN D PO) Take 1 tablet by mouth daily. With breakfast  . Ensure (ENSURE) Take 237 mLs by mouth in the morning, at noon, and at bedtime.  Marland Kitchen escitalopram (LEXAPRO) 10 MG tablet Take 10 mg by mouth daily.  Marland Kitchen guaiFENesin (MUCINEX) 600 MG 12 hr tablet Take 600 mg by mouth 2 (two) times daily.   . hydrocortisone (ANUSOL-HC) 2.5 % rectal cream Place 1 application rectally 2 (two) times daily as needed for hemorrhoids or anal itching.  Marland Kitchen ipratropium-albuterol (DUONEB) 0.5-2.5 (3) MG/3ML SOLN Take 3 mLs by nebulization every 6 (six) hours. For coughing, wheezing, and shortness of breath.  . isosorbide mononitrate (IMDUR) 30 MG 24 hr tablet Take 15 mg by mouth daily.  Marland Kitchen loratadine (CLARITIN) 10 MG tablet Take 10 mg by mouth daily.  . Metoprolol Tartrate 37.5 MG TABS Take 1 tablet by mouth 2 (two) times daily.  . mometasone-formoterol (DULERA) 100-5 MCG/ACT AERO Inhale 2 puffs into the lungs 2 (two) times daily. Gargle and rinse your mouth with water after each use of this  medication to help prevent dryness,  . Multiple Vitamin (MULTIVITAMIN WITH MINERALS) TABS tablet Take 1 tablet by mouth daily.  . NON FORMULARY Diet order: Regular/thin diet, Continue Liberalized diet per dietary  . Nutritional Supplements (NUTRITIONAL SUPPLEMENT PO) Take 1 each by mouth. Magic Cup with L/D meal  . polyethylene glycol (MIRALAX / GLYCOLAX) packet Take 17 g by mouth daily as needed.  . promethazine (PHENERGAN) 12.5 MG tablet Take 12.5 mg by mouth 3 (three) times daily as needed for nausea or vomiting.  Marland Kitchen QUEtiapine (SEROQUEL) 50 MG tablet Take 50 mg by mouth at bedtime.  . tamsulosin (FLOMAX) 0.4 MG CAPS capsule Take 1 capsule (0.4 mg total) by mouth daily.  . verapamil (VERELAN PM) 120 MG 24 hr capsule Take 120 mg by mouth at bedtime.  Marland Kitchen warfarin (COUMADIN) 2.5 MG tablet Take 2.5 mg by mouth daily.   No facility-administered encounter medications on file as of 07/08/2020.    Review of Systems  Unable to perform  ROS: Dementia    Immunization History  Administered Date(s) Administered  . Fluad Quad(high Dose 65+) 03/07/2019  . Influenza Split 03/30/2012, 03/07/2019  . Influenza Whole 04/30/2006, 04/20/2008, 04/15/2009  . Influenza, High Dose Seasonal PF 04/20/2013, 03/27/2015, 03/31/2016, 04/22/2017, 04/20/2018  . Influenza,inj,Quad PF,6+ Mos 05/23/2014  . Influenza-Unspecified 04/23/2020  . Moderna Sars-Covid-2 Vaccination 07/06/2019, 08/03/2019  . Pneumococcal Conjugate-13 04/24/2015  . Pneumococcal Polysaccharide-23 07/06/2009  . Td 10/30/2016   Pertinent  Health Maintenance Due  Topic Date Due  . DEXA SCAN  Never done  . INFLUENZA VACCINE  Completed  . PNA vac Low Risk Adult  Completed   Fall Risk  06/07/2020 07/22/2018 01/18/2018 10/30/2016 08/14/2015  Falls in the past year? - 1 No No No  Number falls in past yr: - 1 - - -  Injury with Fall? - 0 - - -  Risk for fall due to : History of fall(s);Impaired balance/gait;Impaired mobility;Mental status change - - - -   Follow up Falls evaluation completed;Education provided;Falls prevention discussed - - - -   Functional Status Survey:    There were no vitals filed for this visit. There is no height or weight on file to calculate BMI. Physical Exam Vitals reviewed.  Constitutional:      General: She is not in acute distress.    Appearance: Normal appearance.  HENT:     Right Ear: There is no impacted cerumen.     Left Ear: There is no impacted cerumen.     Nose: Nose normal.     Mouth/Throat:     Mouth: Mucous membranes are moist.     Pharynx: No posterior oropharyngeal erythema.  Eyes:     General:        Right eye: No discharge.        Left eye: No discharge.     Extraocular Movements: Extraocular movements intact.     Pupils: Pupils are equal, round, and reactive to light.  Cardiovascular:     Rate and Rhythm: Normal rate. Rhythm irregular.     Pulses: Normal pulses.     Heart sounds: Normal heart sounds. No murmur heard.   Pulmonary:     Effort: Pulmonary effort is normal. No respiratory distress.     Breath sounds: Normal breath sounds. No wheezing.  Abdominal:     General: Bowel sounds are normal. There is no distension.     Palpations: Abdomen is soft.     Tenderness: There is no abdominal tenderness.  Musculoskeletal:     Cervical back: Normal range of motion.     Right lower leg: No edema.     Left lower leg: No edema.  Lymphadenopathy:     Cervical: No cervical adenopathy.  Skin:    General: Skin is warm and dry.     Capillary Refill: Capillary refill takes less than 2 seconds.  Neurological:     Mental Status: She is alert. Mental status is at baseline. She is disoriented.     Cranial Nerves: Cranial nerves are intact.     Sensory: Sensation is intact.     Motor: Weakness present.     Coordination: Coordination is intact.     Gait: Gait abnormal.     Comments: Smile symmetrical. Bilateral hand grips 5/5. Bilateral dorsal flexion 4/5.   Psychiatric:        Mood  and Affect: Mood normal.        Behavior: Behavior normal.        Cognition and  Memory: Memory is impaired.     Labs reviewed: Recent Labs    03/22/20 0000 05/27/20 0846 05/27/20 0847 05/29/20 0220  NA 146 147* 144 143  K 3.9 3.9 4.0 4.4  CL 109* 115* 117* 112*  CO2 23*  --  19* 22  GLUCOSE  --  69* 71 89  BUN 30* 34* 28* 24*  CREATININE 1.2* 0.80 0.91 0.84  CALCIUM 8.7  --  7.2* 8.6*   Recent Labs    12/07/19 0000 05/27/20 0847  AST 15 21  ALT 6* 10  ALKPHOS  --  44  BILITOT  --  0.8  PROT  --  4.8*  ALBUMIN 4.3 2.6*   Recent Labs    08/11/19 0000 09/07/19 0000 11/09/19 0000 12/07/19 0000 03/22/20 0000 05/27/20 0846 05/27/20 0912  WBC 5.2 3.4   < > 6.6 3.9  --  5.8  NEUTROABS 4 2  --   --   --   --  4.1  HGB 12.4 11.9*   < > 11.8* 12.3 11.2* 12.9  HCT 37 36   < > 36 38 33.0* 42.3  MCV  --   --   --   --   --   --  96.6  PLT 69* 63*  --   --  59*  --  94*   < > = values in this interval not displayed.   Lab Results  Component Value Date   TSH 3.58 08/22/2018   Lab Results  Component Value Date   HGBA1C 5.6 05/27/2020   Lab Results  Component Value Date   CHOL 95 05/27/2020   HDL 32 (L) 05/27/2020   LDLCALC 55 05/27/2020   TRIG 39 05/27/2020   CHOLHDL 3.0 05/27/2020    Significant Diagnostic Results in last 30 days:  No results found.  Assessment/Plan 1. Essential hypertension - bp at goal < 150/90 - continue metoprolol and verapamil regimen  2. Longstanding persistent atrial fibrillation (HCC) - rate controlled  - continue baby asa, verapamil and warfarin regimen  3. Warfarin anticoagulation - ongoing, last INR 2.4 - warfarin increased to 3 mg daily and recheck INR in 1 week  4. Chronic renal insufficiency, stage II (mild) - continue to avoid nephrotoxic medications and dose adjust medications to be renally excreted  5. Chronic diastolic congestive heart failure ([place]) - stable with no recent exacerbations  6. Generalized  weakness - ongoing with recent fall reported - PT/OT d/c 12/16 - continue falls prevention plan  7. Confusion and disorientation - suspect possible UTI due to increased confusion - will order UA and urine culture to confirm - cbc/diff - bmp  8. Dementia with psychosis - typically in a good mood, today she is even with sad stories - continue seroquel Q hs for psychosis    Family/ staff Communication: Plan discussed with patient and facility nurse.   Labs/tests ordered:  Cbc/diff, bmp, UA and urine culture   Windell Moulding, AGNP-C [facility] [address], [place] [address] On Call:  [phone] & follow prompts after 5pm & weekends Office Phone:  [phone] Office Fax:  [phone]

## 2020-07-09 DIAGNOSIS — N390 Urinary tract infection, site not specified: Secondary | ICD-10-CM | POA: Diagnosis not present

## 2020-07-09 LAB — CBC AND DIFFERENTIAL
HCT: 34 — AB (ref 36–46)
Hemoglobin: 11.1 — AB (ref 12.0–16.0)
Platelets: 72 — AB (ref 150–399)
WBC: 3.5

## 2020-07-09 LAB — CBC: RBC: 3.84 — AB (ref 3.87–5.11)

## 2020-07-11 ENCOUNTER — Inpatient Hospital Stay: Payer: Medicare Other | Admitting: Adult Health

## 2020-07-27 ENCOUNTER — Telehealth: Payer: Self-pay | Admitting: Adult Health

## 2020-07-27 NOTE — Telephone Encounter (Signed)
Nurse called from [facility] to report an INR of 2.3.  The resident takes coumadin 3 mg daily for afib. The nurse does not have a coumadin sheet or record of the INRs so that we can review the frequency of lab draws and trends regarding the INR. I ordered to continue the current dose of coumadin and recheck the INR in 4 days and f/u with the facility provider.

## 2020-07-29 ENCOUNTER — Non-Acute Institutional Stay (SKILLED_NURSING_FACILITY): Payer: Medicare Other | Admitting: Orthopedic Surgery

## 2020-07-29 ENCOUNTER — Encounter: Payer: Self-pay | Admitting: Orthopedic Surgery

## 2020-07-29 DIAGNOSIS — I10 Essential (primary) hypertension: Secondary | ICD-10-CM | POA: Diagnosis not present

## 2020-07-29 DIAGNOSIS — I4811 Longstanding persistent atrial fibrillation: Secondary | ICD-10-CM | POA: Diagnosis not present

## 2020-07-29 DIAGNOSIS — Z7901 Long term (current) use of anticoagulants: Secondary | ICD-10-CM

## 2020-07-29 LAB — CBC AND DIFFERENTIAL
HCT: 35 — AB (ref 36–46)
Hemoglobin: 11.9 — AB (ref 12.0–16.0)
Platelets: 79 — AB (ref 150–399)
WBC: 3.8

## 2020-07-29 LAB — CBC: RBC: 3.99 (ref 3.87–5.11)

## 2020-07-29 NOTE — Progress Notes (Addendum)
Location:   [facility] of Service:   [facility] Provider:  [place]. Mariea Clonts, D.O., C.M.D.  Gayland Curry, DO  Patient Care Team: Gayland Curry, DO as PCP - General (Geriatric Medicine) Ngetich, Nelda Bucks, NP as Nurse Practitioner (Family Medicine) Rehab, [place] ([facility])  Extended Emergency Contact Information Primary Emergency Contact: [name],[name] Address: [address]          [place], [place] [address] [name] of [place] Phone: [phone] Mobile Phone: [phone] Relation: Daughter Secondary Emergency Contact: [name] Address: [address]          [place], [place] [address] [name] of [place] Phone: [phone] Mobile Phone: [phone] Relation: Daughter  Code Status: DNR Goals of care: Advanced Directive information Advanced Directives 07/08/2020  Does Patient Have a Medical Advance Directive? Yes  Type of Advance Directive Out of facility DNR (pink MOST or yellow form)  Does patient want to make changes to medical advance directive? No - Patient declined  Copy of [place] in Chart? Yes - validated most recent copy scanned in chart (See row information)  Would patient like information on creating a medical advance directive? -  Pre-existing out of facility DNR order (yellow form or pink MOST form) Yellow form placed in chart (order not valid for inpatient use)     No chief complaint on file.   HPI:  Pt is a 87 y.o. female seen today for an acute visit for decreased INR level.   She is currently a resident of [facility], seen today at bedside. PMH includes: hypertension, a- fib, TIA, CAD, CHF, asthma, left sided weakness, chronic renal insufficiency stage II, and dementia.   Current INR 2.3 on 3 mg coumadin. Due to her past cardiac history, her goal INR is 2.5. She was hospitalized  05/2020 for TIA. Advised to f/u with neurology in 6 weeks to discuss DOAC. Daughter canceled f/u appointment due to increased confusion that results after visits.   Spoke with [name], daughter, about DOAC. States her mom never wanted it, because she is on a fixed income and it is expensive. In addition, her old PCP advised against DOAC medications. States most successful coumadin dose in the past was 2 mg daily and skip Tuesday and Thursday.   Today, she is alert to and familiar staff. Follows commands and can express needs.   Facility nurse does not report any concerns, vitals stable.   Past Medical History:  Diagnosis Date  . Acute encephalopathy   . Acute renal failure superimposed on stage 3 chronic kidney disease ([facility])   . Anemia   . Atrial fibrillation ([place])   . Colon cancer ([facility]) 12/11  . Colon cancer ([place]) 2011  . Diarrhea   . DVT (deep venous thrombosis) ([facility]) 11/06  . Elevated brain natriuretic peptide (BNP) level   . Frequent headaches   . Heart murmur   . Hyperkalemia   . Hypertension   . Pulmonary embolism ([facility]) 11/06  . Seizures ([place])   . Stroke ([facility]) 11/30/2014  . Urine retention 01/2019   Past Surgical History:  Procedure Laterality Date  . APPENDECTOMY  1952  . COLECTOMY  06/2010   partial, Dr. Donne Hazel complicated by LLE CVT; S/P IVC umbrella & anemia  . HIP FRACTURE SURGERY  2006   Trauma  . TOTAL ABDOMINAL HYSTERECTOMY W/ BILATERAL SALPINGOOPHORECTOMY  1973   For Fibroids  .  TOTAL HIP ARTHROPLASTY  01/03/07   Left hip replacement.  Marland Kitchen VENA CAVA FILTER PLACEMENT  06/28/2010    No Known Allergies  Outpatient Encounter Medications as of 07/29/2020  Medication Sig  . acetaminophen (TYLENOL) 500 MG tablet Take 1,000 mg by mouth every 8 (eight) hours as needed for headache (pain).   Marland Kitchen albuterol (ACCUNEB) 1.25 MG/3ML nebulizer solution Take 2.5 mg by nebulization every 6 (six) hours as needed for wheezing or shortness of breath.   Marland Kitchen albuterol (VENTOLIN HFA)  108 (90 Base) MCG/ACT inhaler Inhale 2 puffs into the lungs every 6 (six) hours as needed for wheezing or shortness of breath.  Marland Kitchen aspirin 81 MG chewable tablet Chew 81 mg by mouth daily.  Marland Kitchen atorvastatin (LIPITOR) 20 MG tablet Take 1 tablet (20 mg total) by mouth daily.  . bisacodyl (DULCOLAX) 10 MG suppository Place 10 mg rectally as needed for moderate constipation.  . Calcium Carbonate-Vitamin D (OYSTER SHELL/VITAMIN D PO) Take 1 tablet by mouth daily. With breakfast  . Ensure (ENSURE) Take 237 mLs by mouth in the morning, at noon, and at bedtime.  Marland Kitchen escitalopram (LEXAPRO) 10 MG tablet Take 10 mg by mouth daily.  Marland Kitchen guaiFENesin (MUCINEX) 600 MG 12 hr tablet Take 600 mg by mouth 2 (two) times daily.   . hydrocortisone (ANUSOL-HC) 2.5 % rectal cream Place 1 application rectally 2 (two) times daily as needed for hemorrhoids or anal itching.  Marland Kitchen ipratropium-albuterol (DUONEB) 0.5-2.5 (3) MG/3ML SOLN Take 3 mLs by nebulization every 6 (six) hours. For coughing, wheezing, and shortness of breath.  . isosorbide mononitrate (IMDUR) 30 MG 24 hr tablet Take 15 mg by mouth daily.  Marland Kitchen loratadine (CLARITIN) 10 MG tablet Take 10 mg by mouth daily.  . Metoprolol Tartrate 37.5 MG TABS Take 1 tablet by mouth 2 (two) times daily.  . mometasone-formoterol (DULERA) 100-5 MCG/ACT AERO Inhale 2 puffs into the lungs 2 (two) times daily. Gargle and rinse your mouth with water after each use of this medication to help prevent dryness,  . Multiple Vitamin (MULTIVITAMIN WITH MINERALS) TABS tablet Take 1 tablet by mouth daily.  . NON FORMULARY Diet order: Regular/thin diet, Continue Liberalized diet per dietary  . Nutritional Supplements (NUTRITIONAL SUPPLEMENT PO) Take 1 each by mouth. Magic Cup with L/D meal  . polyethylene glycol (MIRALAX / GLYCOLAX) packet Take 17 g by mouth daily as needed.  . promethazine (PHENERGAN) 12.5 MG tablet Take 12.5 mg by mouth 3 (three) times daily as needed for nausea or vomiting.  Marland Kitchen  QUEtiapine (SEROQUEL) 50 MG tablet Take 50 mg by mouth at bedtime.  . tamsulosin (FLOMAX) 0.4 MG CAPS capsule Take 1 capsule (0.4 mg total) by mouth daily.  . verapamil (VERELAN PM) 120 MG 24 hr capsule Take 120 mg by mouth at bedtime.  Marland Kitchen warfarin (COUMADIN) 3 MG tablet Take 3 mg by mouth daily.   No facility-administered encounter medications on file as of 07/29/2020.    Review of Systems  Unable to perform ROS: Dementia    Immunization History  Administered Date(s) Administered  . Fluad Quad(high Dose 65+) 03/07/2019  . Influenza Split 03/30/2012, 03/07/2019  . Influenza Whole 04/30/2006, 04/20/2008, 04/15/2009  . Influenza, High Dose Seasonal PF 04/20/2013, 03/27/2015, 03/31/2016, 04/22/2017, 04/20/2018  . Influenza,inj,Quad PF,6+ Mos 05/23/2014  . Influenza-Unspecified 04/23/2020  . Moderna Sars-Covid-2 Vaccination 07/06/2019, 08/03/2019  . Pneumococcal Conjugate-13 04/24/2015  . Pneumococcal Polysaccharide-23 07/06/2009  . Td 10/30/2016   Pertinent  Health Maintenance Due  Topic Date Due  .  DEXA SCAN  Never done  . INFLUENZA VACCINE  Completed  . PNA vac Low Risk Adult  Completed   Fall Risk  06/07/2020 07/22/2018 01/18/2018 10/30/2016 08/14/2015  Falls in the past year? - 1 No No No  Number falls in past yr: - 1 - - -  Injury with Fall? - 0 - - -  Risk for fall due to : History of fall(s);Impaired balance/gait;Impaired mobility;Mental status change - - - -  Follow up Falls evaluation completed;Education provided;Falls prevention discussed - - - -   Functional Status Survey:    There were no vitals filed for this visit. There is no height or weight on file to calculate BMI. Physical Exam Vitals reviewed.  Constitutional:      General: She is not in acute distress. Cardiovascular:     Rate and Rhythm: Normal rate. Rhythm irregular.     Pulses: Normal pulses.     Heart sounds: Normal heart sounds. No murmur heard.   Pulmonary:     Effort: Pulmonary effort is normal.  No respiratory distress.     Breath sounds: Normal breath sounds. No wheezing.  Neurological:     General: No focal deficit present.     Mental Status: She is alert. Mental status is at baseline.     Motor: Weakness present.     Gait: Gait abnormal.  Psychiatric:        Attention and Perception: Attention normal.        Mood and Affect: Mood normal.        Cognition and Memory: Memory is impaired.     Labs reviewed: Recent Labs    03/22/20 0000 05/27/20 0846 05/27/20 0847 05/29/20 0220  NA 146 147* 144 143  K 3.9 3.9 4.0 4.4  CL 109* 115* 117* 112*  CO2 23*  --  19* 22  GLUCOSE  --  69* 71 89  BUN 30* 34* 28* 24*  CREATININE 1.2* 0.80 0.91 0.84  CALCIUM 8.7  --  7.2* 8.6*   Recent Labs    12/07/19 0000 05/27/20 0847  AST 15 21  ALT 6* 10  ALKPHOS  --  44  BILITOT  --  0.8  PROT  --  4.8*  ALBUMIN 4.3 2.6*   Recent Labs    08/11/19 0000 09/07/19 0000 11/09/19 0000 12/07/19 0000 03/22/20 0000 05/27/20 0846 05/27/20 0912  WBC 5.2 3.4   < > 6.6 3.9  --  5.8  NEUTROABS 4 2  --   --   --   --  4.1  HGB 12.4 11.9*   < > 11.8* 12.3 11.2* 12.9  HCT 37 36   < > 36 38 33.0* 42.3  MCV  --   --   --   --   --   --  96.6  PLT 69* 63*  --   --  59*  --  94*   < > = values in this interval not displayed.   Lab Results  Component Value Date   TSH 3.58 08/22/2018   Lab Results  Component Value Date   HGBA1C 5.6 05/27/2020   Lab Results  Component Value Date   CHOL 95 05/27/2020   HDL 32 (L) 05/27/2020   LDLCALC 55 05/27/2020   TRIG 39 05/27/2020   CHOLHDL 3.0 05/27/2020    Significant Diagnostic Results in last 30 days:  No results found.  Assessment/Plan 1. Warfarin anticoagulation - INR 2.3- not at goal on 3 mg coumadin -  will increase coumadin to 3.5 mg today, then resume 3 mg - neurology f/u canceled by daughter, concerned about mothers confusion - daughter states mother did not want DOAC due to cost  2. Longstanding persistent atrial  fibrillation ([place]) - rate controlled with metoprolol and verapamil - continue to avoid food high in vitamin K    Family/ staff Communication: Plan discussed with patient and facility nurse  Labs/tests ordered:  PT/INR 01/26, then 2/2  Windell Moulding, AGNP-C [facility] [address], [place] [address] On Call:  [phone] & follow prompts after 5pm & weekends Office Phone:  [phone] Office Fax:  [phone]

## 2020-08-12 DIAGNOSIS — I10 Essential (primary) hypertension: Secondary | ICD-10-CM | POA: Diagnosis not present

## 2020-08-12 LAB — CBC AND DIFFERENTIAL
HCT: 37 (ref 36–46)
Hemoglobin: 12.2 (ref 12.0–16.0)
Platelets: 73 — AB (ref 150–399)
WBC: 4.2

## 2020-08-12 LAB — CBC: RBC: 4.02 (ref 3.87–5.11)

## 2020-08-15 ENCOUNTER — Encounter: Payer: Self-pay | Admitting: Orthopedic Surgery

## 2020-08-15 ENCOUNTER — Non-Acute Institutional Stay (SKILLED_NURSING_FACILITY): Payer: Medicare Other | Admitting: Orthopedic Surgery

## 2020-08-15 DIAGNOSIS — I10 Essential (primary) hypertension: Secondary | ICD-10-CM | POA: Diagnosis not present

## 2020-08-15 DIAGNOSIS — N182 Chronic kidney disease, stage 2 (mild): Secondary | ICD-10-CM

## 2020-08-15 DIAGNOSIS — I5032 Chronic diastolic (congestive) heart failure: Secondary | ICD-10-CM | POA: Diagnosis not present

## 2020-08-15 DIAGNOSIS — Z7901 Long term (current) use of anticoagulants: Secondary | ICD-10-CM

## 2020-08-15 DIAGNOSIS — R531 Weakness: Secondary | ICD-10-CM

## 2020-08-15 DIAGNOSIS — F0391 Unspecified dementia with behavioral disturbance: Secondary | ICD-10-CM

## 2020-08-15 DIAGNOSIS — F0392 Unspecified dementia, unspecified severity, with psychotic disturbance: Secondary | ICD-10-CM

## 2020-08-15 DIAGNOSIS — E441 Mild protein-calorie malnutrition: Secondary | ICD-10-CM | POA: Diagnosis not present

## 2020-08-15 NOTE — Progress Notes (Signed)
Location:    [facility] Room Number: 789/F Place of Service:  SNF [phone]) Provider: Windell Moulding NP    Gayland Curry, DO  Patient Care Team: Gayland Curry, DO as PCP - General (Geriatric Medicine) Ngetich, Nelda Bucks, NP as Nurse Practitioner (Family Medicine) Rehab, [place] ([facility])  Extended Emergency Contact Information Primary Emergency Contact: [name],[name] Address: [address]          [place], [place] [address] [name] of [place] Phone: [phone] Mobile Phone: [phone] Relation: Daughter Secondary Emergency Contact: [name] Address: [address]          [place], [place] [address] [name] of [place] Phone: [phone] Mobile Phone: [phone] Relation: Daughter  Code Status:  DNR Goals of care: Advanced Directive information Advanced Directives 07/29/2020  Does Patient Have a Medical Advance Directive? Yes  Type of Advance Directive Out of facility DNR (pink MOST or yellow form)  Does patient want to make changes to medical advance directive? No - Patient declined  Copy of [place] in Chart? Yes - validated most recent copy scanned in chart (See row information)  Would patient like information on creating a medical advance directive? -  Pre-existing out of facility DNR order (yellow form or pink MOST form) Yellow form placed in chart (order not valid for inpatient use)     Chief Complaint  Patient presents with  . Medical Management of Chronic Issues    Routine Visit of Medical Management     HPI:  Pt is a 87 y.o. female seen today for medical management of chronic diseases.    She is a resident of [facility], seen at bedside today. PMH includes: a-fib, CAD, CHF, hypertension, pulmonary hypertension, TIA, asthma, left sided weakness, DDD, chronic renal insufficiency stage II, constipation, hallucination and  generalized weakness.   Today, she is laying in bed. Alert to self and familiar faces. Disoriented to place, time and situation. Follows commands and can express needs. She is in a pleasant mood, happy to have company. Reports she has chronic pain and asking for someone to rub her back. 01/12 she was discharged form the restorative nursing and ROM program, she kept refusing to participate. Still able to feed self.   Skin appeared dry, I was able to rub some lotion on her back, she was very pleased.   No recent falls, injuries or behavioral outbursts.   Recent blood pressures are as follows:  02/10- 134/82  02/09- 124/96  02/08- 123/88  Recent weights are as follows:  12/08- 138.2 lbs  01/21- 134.1 lbs  02/06- 132.6 lbs  Facility nurse does not report any concerns, vitals stable.        Past Medical History:  Diagnosis Date  . Acute encephalopathy   . Acute renal failure superimposed on stage 3 chronic kidney disease ([facility])   . Anemia   . Atrial fibrillation ([place])   . Colon cancer ([facility]) 12/11  . Colon cancer ([place] [facility]) 2011  . Diarrhea   . DVT (deep venous thrombosis) ([facility]) 11/06  . Elevated brain natriuretic peptide (BNP) level   . Frequent headaches   . Heart murmur   . Hyperkalemia   . Hypertension   . Pulmonary embolism ([place]) 11/06  . Seizures ([place])   . Stroke ([place] [facility]) 11/30/2014  . Urine retention 01/2019   Past Surgical History:  Procedure Laterality Date  . APPENDECTOMY  1952  .  COLECTOMY  06/2010   partial, Dr. Donne Hazel complicated by LLE CVT; S/P IVC umbrella & anemia  . HIP FRACTURE SURGERY  2006   Trauma  . TOTAL ABDOMINAL HYSTERECTOMY W/ BILATERAL SALPINGOOPHORECTOMY  1973   For Fibroids  . TOTAL HIP ARTHROPLASTY  01/03/07   Left hip replacement.  Marland Kitchen VENA CAVA FILTER PLACEMENT  06/28/2010    No Known Allergies  Allergies as of 08/15/2020   No Known Allergies     Medication List       Accurate as of August 15, 2020  1:24 PM. If you have any  questions, ask your nurse or doctor.        STOP taking these medications   warfarin 3 MG tablet Commonly known as: COUMADIN Stopped by: Yvonna Alanis, NP     TAKE these medications   acetaminophen 500 MG tablet Commonly known as: TYLENOL Take 1,000 mg by mouth every 8 (eight) hours as needed for headache (pain).   albuterol 1.25 MG/3ML nebulizer solution Commonly known as: ACCUNEB Take 2.5 mg by nebulization every 6 (six) hours as needed for wheezing or shortness of breath.   albuterol 108 (90 Base) MCG/ACT inhaler Commonly known as: VENTOLIN HFA Inhale 2 puffs into the lungs every 6 (six) hours as needed for wheezing or shortness of breath.   aspirin 81 MG chewable tablet Chew 81 mg by mouth daily.   atorvastatin 20 MG tablet Commonly known as: LIPITOR Take 1 tablet (20 mg total) by mouth daily.   bisacodyl 10 MG suppository Commonly known as: DULCOLAX Place 10 mg rectally as needed for moderate constipation.   Ensure Take 237 mLs by mouth in the morning, at noon, and at bedtime.   NUTRITIONAL SUPPLEMENT PO Take 1 each by mouth. Magic Cup with L/D meal   escitalopram 10 MG tablet Commonly known as: LEXAPRO Take 10 mg by mouth daily.   guaiFENesin 600 MG 12 hr tablet Commonly known as: MUCINEX Take 600 mg by mouth 2 (two) times daily.   hydrocortisone 2.5 % rectal cream Commonly known as: ANUSOL-HC Place 1 application rectally 2 (two) times daily as needed for hemorrhoids or anal itching.   ipratropium-albuterol 0.5-2.5 (3) MG/3ML Soln Commonly known as: DUONEB Take 3 mLs by nebulization every 6 (six) hours. For coughing, wheezing, and shortness of breath.   isosorbide mononitrate 30 MG 24 hr tablet Commonly known as: IMDUR Take 15 mg by mouth daily.   loratadine 10 MG tablet Commonly known as: CLARITIN Take 10 mg by mouth daily.   Metoprolol Tartrate 37.5 MG Tabs Take 1 tablet by mouth 2 (two) times daily.   mometasone-formoterol 100-5 MCG/ACT  Aero Commonly known as: DULERA Inhale 2 puffs into the lungs 2 (two) times daily. Gargle and rinse your mouth with water after each use of this medication to help prevent dryness,   multivitamin with minerals Tabs tablet Take 1 tablet by mouth daily.   NON FORMULARY Diet order: Regular/thin diet, Continue Liberalized diet per dietary   OYSTER SHELL/VITAMIN D PO Take 1 tablet by mouth daily. With breakfast   polyethylene glycol 17 g packet Commonly known as: MIRALAX / GLYCOLAX Take 17 g by mouth daily as needed.   promethazine 12.5 MG tablet Commonly known as: PHENERGAN Take 12.5 mg by mouth 3 (three) times daily as needed for nausea or vomiting.   QUEtiapine 50 MG tablet Commonly known as: SEROQUEL Take 50 mg by mouth at bedtime.   tamsulosin 0.4 MG Caps capsule Commonly known as:  FLOMAX Take 1 capsule (0.4 mg total) by mouth daily.   verapamil 120 MG 24 hr capsule Commonly known as: VERELAN PM Take 120 mg by mouth at bedtime.       Review of Systems  Unable to perform ROS: Dementia    Immunization History  Administered Date(s) Administered  . Fluad Quad(high Dose 65+) 03/07/2019  . Influenza Split 03/30/2012, 03/07/2019  . Influenza Whole 04/30/2006, 04/20/2008, 04/15/2009  . Influenza, High Dose Seasonal PF 04/20/2013, 03/27/2015, 03/31/2016, 04/22/2017, 04/20/2018  . Influenza,inj,Quad PF,6+ Mos 05/23/2014  . Influenza-Unspecified 04/23/2020  . Moderna Sars-Covid-2 Vaccination 07/06/2019, 08/03/2019  . Pneumococcal Conjugate-13 04/24/2015  . Pneumococcal Polysaccharide-23 07/06/2009  . Td 10/30/2016   Pertinent  Health Maintenance Due  Topic Date Due  . DEXA SCAN  Never done  . INFLUENZA VACCINE  Completed  . PNA vac Low Risk Adult  Completed   Fall Risk  06/07/2020 07/22/2018 01/18/2018 10/30/2016 08/14/2015  Falls in the past year? - 1 No No No  Number falls in past yr: - 1 - - -  Injury with Fall? - 0 - - -  Risk for fall due to : History of  fall(s);Impaired balance/gait;Impaired mobility;Mental status change - - - -  Follow up Falls evaluation completed;Education provided;Falls prevention discussed - - - -   Functional Status Survey:    Vitals:   08/15/20 1322  BP: (!) 124/96  Pulse: 68  Resp: 20  Temp: (!) 97.1 F (36.2 C)  Weight: 132 lb 9.6 oz (60.1 kg)  Height: 5\' 3"  (1.6 m)   Body mass index is 23.49 kg/m. Physical Exam Vitals reviewed.  Constitutional:      General: She is not in acute distress. HENT:     Head: Normocephalic.     Right Ear: There is no impacted cerumen.     Left Ear: There is no impacted cerumen.     Nose: Nose normal.     Mouth/Throat:     Mouth: Mucous membranes are moist.     Comments: Missing teeth Eyes:     General:        Right eye: Discharge present.        Left eye: Discharge present. Cardiovascular:     Rate and Rhythm: Normal rate. Rhythm irregular.     Pulses: Normal pulses.     Heart sounds: Normal heart sounds. No murmur heard.   Pulmonary:     Effort: Pulmonary effort is normal. No respiratory distress.     Breath sounds: Normal breath sounds. No wheezing.  Abdominal:     General: Bowel sounds are normal. There is no distension.     Palpations: Abdomen is soft.     Tenderness: There is no abdominal tenderness.  Musculoskeletal:     Cervical back: Normal range of motion.     Right lower leg: No edema.     Left lower leg: No edema.     Comments: Kyphosis  Lymphadenopathy:     Cervical: No cervical adenopathy.  Skin:    General: Skin is warm and dry.     Capillary Refill: Capillary refill takes less than 2 seconds.     Comments: Golf ball sized bruise to right inner thigh. No skin breakdown.   Neurological:     General: No focal deficit present.     Mental Status: She is alert. Mental status is at baseline.     Motor: Weakness present.     Gait: Gait abnormal.     Comments: Lift/wheelchair  Psychiatric:        Mood and Affect: Mood normal.         Behavior: Behavior normal.        Cognition and Memory: Memory is impaired.     Labs reviewed: Recent Labs    03/22/20 0000 05/27/20 0846 05/27/20 0847 05/29/20 0220  NA 146 147* 144 143  K 3.9 3.9 4.0 4.4  CL 109* 115* 117* 112*  CO2 23*  --  19* 22  GLUCOSE  --  69* 71 89  BUN 30* 34* 28* 24*  CREATININE 1.2* 0.80 0.91 0.84  CALCIUM 8.7  --  7.2* 8.6*   Recent Labs    12/07/19 0000 05/27/20 0847  AST 15 21  ALT 6* 10  ALKPHOS  --  44  BILITOT  --  0.8  PROT  --  4.8*  ALBUMIN 4.3 2.6*   Recent Labs    09/07/19 0000 11/09/19 0000 05/27/20 0912 07/09/20 0000 07/29/20 0000 08/12/20 0000  WBC 3.4   < > 5.8 3.5 3.8 4.2  NEUTROABS 2  --  4.1  --   --   --   HGB 11.9*   < > 12.9 11.1* 11.9* 12.2  HCT 36   < > 42.3 34* 35* 37  MCV  --   --  96.6  --   --   --   PLT 63*   < > 94* 72* 79* 73*   < > = values in this interval not displayed.   Lab Results  Component Value Date   TSH 3.58 08/22/2018   Lab Results  Component Value Date   HGBA1C 5.6 05/27/2020   Lab Results  Component Value Date   CHOL 95 05/27/2020   HDL 32 (L) 05/27/2020   LDLCALC 55 05/27/2020   TRIG 39 05/27/2020   CHOLHDL 3.0 05/27/2020    Significant Diagnostic Results in last 30 days:  No results found.  Assessment/Plan 1. Warfarin anticoagulation - INR 3.3 today - hold coumadin 02/10 and 02/11 - start 2 mg coumadin po daily on 02/12 - recheck PT/INR 02/15 - continue to avoid foods high in vitamin k  2. Essential hypertension - bp at goal < 150/90 - cont metoprolol - continue to limit sodium in diet < 2000 mg/day  3. Chronic renal insufficiency, stage II (mild) - continue to avoid nephrotoxic drugs like NSAIDS and dose adjust medications to be renally excreted - encourage hydration  4. Chronic diastolic congestive heart failure (HCC) - no recent weight fluctuations, no sob or ankle edema - cont imdur  5. Generalized weakness - cont skilled nursing care - cont falls  safety plan  6. Dementia with psychosis ([place]) - no recent behavioral outbursts - cont seroquel  7. Mild protein-calorie malnutrition ([place]) - appetite fair, maintaining weight - cont magic cups and ensure     Family/ staff Communication:   Labs/tests ordered:

## 2020-08-26 ENCOUNTER — Encounter: Payer: Self-pay | Admitting: Internal Medicine

## 2020-08-27 DIAGNOSIS — D649 Anemia, unspecified: Secondary | ICD-10-CM | POA: Diagnosis not present

## 2020-08-27 LAB — CBC: RBC: 4.24 (ref 3.87–5.11)

## 2020-08-27 LAB — CBC AND DIFFERENTIAL
HCT: 38 (ref 36–46)
Hemoglobin: 12.5 (ref 12.0–16.0)
Platelets: 91 — AB (ref 150–399)
WBC: 4.5

## 2020-08-29 DIAGNOSIS — F339 Major depressive disorder, recurrent, unspecified: Secondary | ICD-10-CM | POA: Diagnosis not present

## 2020-08-29 DIAGNOSIS — F0151 Vascular dementia with behavioral disturbance: Secondary | ICD-10-CM | POA: Diagnosis not present

## 2020-08-29 DIAGNOSIS — R441 Visual hallucinations: Secondary | ICD-10-CM | POA: Diagnosis not present

## 2020-09-12 ENCOUNTER — Encounter: Payer: Self-pay | Admitting: Orthopedic Surgery

## 2020-09-12 ENCOUNTER — Non-Acute Institutional Stay (SKILLED_NURSING_FACILITY): Payer: Medicare Other | Admitting: Orthopedic Surgery

## 2020-09-12 DIAGNOSIS — F0391 Unspecified dementia with behavioral disturbance: Secondary | ICD-10-CM | POA: Diagnosis not present

## 2020-09-12 DIAGNOSIS — L853 Xerosis cutis: Secondary | ICD-10-CM | POA: Diagnosis not present

## 2020-09-12 DIAGNOSIS — I10 Essential (primary) hypertension: Secondary | ICD-10-CM | POA: Diagnosis not present

## 2020-09-12 DIAGNOSIS — E441 Mild protein-calorie malnutrition: Secondary | ICD-10-CM

## 2020-09-12 DIAGNOSIS — I5032 Chronic diastolic (congestive) heart failure: Secondary | ICD-10-CM | POA: Diagnosis not present

## 2020-09-12 DIAGNOSIS — I4811 Longstanding persistent atrial fibrillation: Secondary | ICD-10-CM

## 2020-09-12 DIAGNOSIS — N182 Chronic kidney disease, stage 2 (mild): Secondary | ICD-10-CM

## 2020-09-12 DIAGNOSIS — Z7901 Long term (current) use of anticoagulants: Secondary | ICD-10-CM | POA: Diagnosis not present

## 2020-09-12 DIAGNOSIS — R531 Weakness: Secondary | ICD-10-CM

## 2020-09-12 DIAGNOSIS — F0392 Unspecified dementia, unspecified severity, with psychotic disturbance: Secondary | ICD-10-CM

## 2020-09-12 NOTE — Progress Notes (Signed)
Location:    [facility] Room Number: 304-D Place of Service:  SNF [phone]) Provider:  Windell Moulding NP  Gayland Curry, DO  Patient Care Team: Gayland Curry, DO as PCP - General (Geriatric Medicine) Ngetich, Nelda Bucks, NP as Nurse Practitioner (Family Medicine) Rehab, [facility] ([place])  Extended Emergency Contact Information Primary Emergency Contact: [name],[name] Address: [address]          [place], [place] [address] [name] of [place] Phone: [phone] Mobile Phone: [phone] Relation: Daughter Secondary Emergency Contact: [name] Address: [address]          [place], [place] [address] [name] of [place] Phone: [phone] Mobile Phone: [phone] Relation: Daughter  Code Status:  DNR Goals of care: Advanced Directive information Advanced Directives 09/12/2020  Does Patient Have a Medical Advance Directive? Yes  Type of Advance Directive Out of facility DNR (pink MOST or yellow form)  Does patient want to make changes to medical advance directive? No - Patient declined  Copy of [facility] in Chart? -  Would patient like information on creating a medical advance directive? -  Pre-existing out of facility DNR order (yellow form or pink MOST form) Yellow form placed in chart (order not valid for inpatient use)     Chief Complaint  Patient presents with   Medical Management of Chronic Issues    Routine Visit of Medical Management    Quality Metric Gaps    Discuss need for Dexa Scan   Immunizations    Discuss need for Covid Vaccine    HPI:  Pt is a 87 y.o. female seen today for medical management of chronic diseases.    She is a resident of [facility], seen at bedside today. PMH includes: a-fib, CAD, CHF, hypertension, pulmonary hypertension, TIA, asthma, left sided weakness, DDD, chronic renal insufficiency stage II,  constipation, hallucination and generalized weakness.   Today, she is laying in bed. Alert to self and familiar faces. Follows commands and can express needs. She reports she is sad today. When asked why she states" the rainy weather is making me blue." Facility nurse reports she is talking to herself more often. Also refused some medication 1-2 times within the past month. No behavioral outbursts. Continues to fed self.   No recent falls of injury.   Recent blood pressures are as follows:  03/09- 123/82  03/07- 111/81  03/06- 100/60  Recent weights are as follows:  03/08- 136 lbs  03/01- 135.3 lbs  02/23- 134 lbs  Facility nurse does not report any concerns, vitals stable.   Past Medical History:  Diagnosis Date   Acute encephalopathy    Acute renal failure superimposed on stage 3 chronic kidney disease (HCC)    Anemia    Atrial fibrillation ([place])    Colon cancer ([place]) 12/11   Colon cancer ([place]) 2011   Diarrhea    DVT (deep venous thrombosis) ([place]) 11/06   Elevated brain natriuretic peptide (BNP) level    Frequent headaches    Heart murmur    Hyperkalemia    Hypertension    Pulmonary embolism ([place]) 11/06   Seizures ([place])    Stroke ([place]) 11/30/2014   Urine retention 01/2019   Past Surgical History:  Procedure Laterality Date   APPENDECTOMY  1952   COLECTOMY  06/2010   partial, Dr. Donne Hazel complicated by LLE CVT; S/P IVC umbrella & anemia  HIP FRACTURE SURGERY  2006   Trauma   TOTAL ABDOMINAL HYSTERECTOMY W/ BILATERAL SALPINGOOPHORECTOMY  1973   For Fibroids   TOTAL HIP ARTHROPLASTY  01/03/07   Left hip replacement.   VENA CAVA FILTER PLACEMENT  06/28/2010    No Known Allergies  Allergies as of 09/12/2020   No Known Allergies     Medication List       Accurate as of September 12, 2020  3:26 PM. If you have any questions, ask your nurse or doctor.        acetaminophen 500 MG tablet Commonly known as: TYLENOL Take 1,000 mg by mouth  every 8 (eight) hours as needed for headache (pain).   albuterol 1.25 MG/3ML nebulizer solution Commonly known as: ACCUNEB Take 2.5 mg by nebulization every 6 (six) hours as needed for wheezing or shortness of breath.   albuterol 108 (90 Base) MCG/ACT inhaler Commonly known as: VENTOLIN HFA Inhale 2 puffs into the lungs every 6 (six) hours as needed for wheezing or shortness of breath.   aspirin 81 MG chewable tablet Chew 81 mg by mouth daily.   atorvastatin 20 MG tablet Commonly known as: LIPITOR Take 1 tablet (20 mg total) by mouth daily.   bisacodyl 10 MG suppository Commonly known as: DULCOLAX Place 10 mg rectally as needed for moderate constipation.   Ensure Take 237 mLs by mouth in the morning, at noon, and at bedtime.   NUTRITIONAL SUPPLEMENT PO Take 1 each by mouth. Magic Cup with L/D meal   escitalopram 10 MG tablet Commonly known as: LEXAPRO Take 10 mg by mouth daily.   guaiFENesin 600 MG 12 hr tablet Commonly known as: MUCINEX Take 600 mg by mouth 2 (two) times daily.   hydrocortisone 2.5 % rectal cream Commonly known as: ANUSOL-HC Place 1 application rectally 2 (two) times daily as needed for hemorrhoids or anal itching.   ipratropium-albuterol 0.5-2.5 (3) MG/3ML Soln Commonly known as: DUONEB Take 3 mLs by nebulization every 6 (six) hours. For coughing, wheezing, and shortness of breath.   isosorbide mononitrate 30 MG 24 hr tablet Commonly known as: IMDUR Take 15 mg by mouth daily.   loratadine 10 MG tablet Commonly known as: CLARITIN Take 10 mg by mouth daily.   Metoprolol Tartrate 37.5 MG Tabs Take 1 tablet by mouth 2 (two) times daily.   mometasone-formoterol 100-5 MCG/ACT Aero Commonly known as: DULERA Inhale 2 puffs into the lungs 2 (two) times daily. Gargle and rinse your mouth with water after each use of this medication to help prevent dryness,   multivitamin with minerals Tabs tablet Take 1 tablet by mouth daily.   NON FORMULARY Diet  :Regular /Thin liquids diet .Continue Liberalized diet per DietaryNo therapeutic restrictions   OYSTER SHELL/VITAMIN D PO Take 1 tablet by mouth daily. With breakfast   polyethylene glycol 17 g packet Commonly known as: MIRALAX / GLYCOLAX Take 17 g by mouth daily as needed.   promethazine 12.5 MG tablet Commonly known as: PHENERGAN Take 12.5 mg by mouth 3 (three) times daily as needed for nausea or vomiting.   QUEtiapine 50 MG tablet Commonly known as: SEROQUEL Take 50 mg by mouth at bedtime.   tamsulosin 0.4 MG Caps capsule Commonly known as: FLOMAX Take 1 capsule (0.4 mg total) by mouth daily.   verapamil 120 MG 24 hr capsule Commonly known as: VERELAN PM Take 120 mg by mouth at bedtime.   warfarin 2 MG tablet Commonly known as: COUMADIN Take as directed by the  anticoagulation clinic. If you are unsure how to take this medication, talk to your nurse or doctor. Original instructions: Take 2 mg by mouth daily. On Tues, Wed, Thurs, Sat, Sun   warfarin 4 MG tablet Commonly known as: COUMADIN Take as directed by the anticoagulation clinic. If you are unsure how to take this medication, talk to your nurse or doctor. Original instructions: Take 4 mg by mouth daily. On Mondays, and Fridays       Review of Systems  Unable to perform ROS: Dementia    Immunization History  Administered Date(s) Administered   Fluad Quad(high Dose 65+) 03/07/2019   Influenza Split 03/30/2012, 03/07/2019   Influenza Whole 04/30/2006, 04/20/2008, 04/15/2009   Influenza, High Dose Seasonal PF 04/20/2013, 03/27/2015, 03/31/2016, 04/22/2017, 04/20/2018   Influenza,inj,Quad PF,6+ Mos 05/23/2014   Influenza-Unspecified 04/23/2020   Moderna Sars-Covid-2 Vaccination 07/06/2019, 08/03/2019   Pneumococcal Conjugate-13 04/24/2015   Pneumococcal Polysaccharide-23 07/06/2009   Td 10/30/2016   Pertinent  Health Maintenance Due  Topic Date Due   DEXA SCAN  Never done   INFLUENZA VACCINE   Completed   PNA vac Low Risk Adult  Completed   Fall Risk  06/07/2020 07/22/2018 01/18/2018 10/30/2016 08/14/2015  Falls in the past year? - 1 No No No  Number falls in past yr: - 1 - - -  Injury with Fall? - 0 - - -  Risk for fall due to : History of fall(s);Impaired balance/gait;Impaired mobility;Mental status change - - - -  Follow up Falls evaluation completed;Education provided;Falls prevention discussed - - - -   Functional Status Survey:    Vitals:   09/12/20 1522  BP: 123/82  Pulse: 65  Resp: 16  Temp: 98.8 F (37.1 C)  Weight: 136 lb (61.7 kg)  Height: 5\' 3"  (1.6 m)   Body mass index is 24.09 kg/m. Physical Exam Vitals reviewed.  Constitutional:      General: She is not in acute distress. HENT:     Head: Normocephalic.     Right Ear: There is no impacted cerumen.     Left Ear: There is no impacted cerumen.     Nose: Nose normal.     Mouth/Throat:     Mouth: Mucous membranes are moist.     Comments: Missing teeth, poor dentition Eyes:     General:        Right eye: No discharge.        Left eye: No discharge.  Neck:     Comments: Forward neck protrusion Cardiovascular:     Rate and Rhythm: Normal rate. Rhythm irregular.     Pulses: Normal pulses.     Heart sounds: Normal heart sounds. No murmur heard.   Pulmonary:     Effort: Pulmonary effort is normal. No respiratory distress.     Breath sounds: Normal breath sounds. No wheezing.  Abdominal:     General: Bowel sounds are normal. There is no distension.     Palpations: Abdomen is soft.     Tenderness: There is no abdominal tenderness.  Musculoskeletal:     Cervical back: Normal range of motion.     Right lower leg: No edema.     Left lower leg: No edema.     Comments: kyphosis  Lymphadenopathy:     Cervical: No cervical adenopathy.  Skin:    General: Skin is warm and dry.     Capillary Refill: Capillary refill takes less than 2 seconds.     Comments: Skin fragile and thin.  Long fingernails   Neurological:     General: No focal deficit present.     Mental Status: She is alert. Mental status is at baseline.     Motor: Weakness present.     Gait: Gait abnormal.     Comments: Wheelchair/lift  Psychiatric:        Mood and Affect: Mood normal.        Behavior: Behavior normal.        Cognition and Memory: Memory is impaired.     Labs reviewed: Recent Labs    03/22/20 0000 05/27/20 0846 05/27/20 0847 05/29/20 0220  NA 146 147* 144 143  K 3.9 3.9 4.0 4.4  CL 109* 115* 117* 112*  CO2 23*  --  19* 22  GLUCOSE  --  69* 71 89  BUN 30* 34* 28* 24*  CREATININE 1.2* 0.80 0.91 0.84  CALCIUM 8.7  --  7.2* 8.6*   Recent Labs    12/07/19 0000 05/27/20 0847  AST 15 21  ALT 6* 10  ALKPHOS  --  44  BILITOT  --  0.8  PROT  --  4.8*  ALBUMIN 4.3 2.6*   Recent Labs    05/27/20 0912 07/09/20 0000 07/29/20 0000 08/12/20 0000 08/27/20 0000  WBC 5.8   < > 3.8 4.2 4.5  NEUTROABS 4.1  --   --   --   --   HGB 12.9   < > 11.9* 12.2 12.5  HCT 42.3   < > 35* 37 38  MCV 96.6  --   --   --   --   PLT 94*   < > 79* 73* 91*   < > = values in this interval not displayed.   Lab Results  Component Value Date   TSH 3.58 08/22/2018   Lab Results  Component Value Date   HGBA1C 5.6 05/27/2020   Lab Results  Component Value Date   CHOL 95 05/27/2020   HDL 32 (L) 05/27/2020   LDLCALC 55 05/27/2020   TRIG 39 05/27/2020   CHOLHDL 3.0 05/27/2020    Significant Diagnostic Results in last 30 days:  No results found.  Assessment/Plan 1. Warfarin anticoagulation - ongoing, cont coumadin - cont PT/INR checks  2. Essential hypertension - bp at goal< 150/90 - cont metoprolol - cont heart healthy diet - cbc/diff - bmp  3. Chronic renal insufficiency, stage II (mild) - continue to avoid nephrotoxic drugs like NSAIDS and dose adjust medications to be renally excreted - encourage hydration  4. Chronic diastolic congestive heart failure (HCC) - no recent weight  fluctuations, sob or ankle edema - cont imdur  5. Generalized weakness - cont skilled nursing care - cont falls safety plan  6. Dementia with psychosis ([place]) - has been talking to herself, refused meds twice in last month - continued to feed self - cont seroquel  7. Mild protein-calorie malnutrition ([place]) - weight stable - cont magic cups and ensure  8. Longstanding persistent atrial fibrillation (HCC) - rate controlled with metoprolol - cont coumadin for clot prevention  9. Dry skin - generalized dry skin - skin appears fragile and thin, fingernails long - orders to cut fingernails placed to prevent skin breakdown     Family/ staff Communication: Plan discussed with patient and facility nurse  Labs/tests ordered:  Cbc/diff, bmp

## 2020-09-13 DIAGNOSIS — I10 Essential (primary) hypertension: Secondary | ICD-10-CM | POA: Diagnosis not present

## 2020-09-17 DIAGNOSIS — R2681 Unsteadiness on feet: Secondary | ICD-10-CM | POA: Diagnosis not present

## 2020-09-17 DIAGNOSIS — J449 Chronic obstructive pulmonary disease, unspecified: Secondary | ICD-10-CM | POA: Diagnosis not present

## 2020-09-17 DIAGNOSIS — I69354 Hemiplegia and hemiparesis following cerebral infarction affecting left non-dominant side: Secondary | ICD-10-CM | POA: Diagnosis not present

## 2020-09-17 DIAGNOSIS — I129 Hypertensive chronic kidney disease with stage 1 through stage 4 chronic kidney disease, or unspecified chronic kidney disease: Secondary | ICD-10-CM | POA: Diagnosis not present

## 2020-09-17 DIAGNOSIS — M6281 Muscle weakness (generalized): Secondary | ICD-10-CM | POA: Diagnosis not present

## 2020-09-18 DIAGNOSIS — R2681 Unsteadiness on feet: Secondary | ICD-10-CM | POA: Diagnosis not present

## 2020-09-18 DIAGNOSIS — J449 Chronic obstructive pulmonary disease, unspecified: Secondary | ICD-10-CM | POA: Diagnosis not present

## 2020-09-18 DIAGNOSIS — I129 Hypertensive chronic kidney disease with stage 1 through stage 4 chronic kidney disease, or unspecified chronic kidney disease: Secondary | ICD-10-CM | POA: Diagnosis not present

## 2020-09-18 DIAGNOSIS — I69354 Hemiplegia and hemiparesis following cerebral infarction affecting left non-dominant side: Secondary | ICD-10-CM | POA: Diagnosis not present

## 2020-09-18 DIAGNOSIS — M6281 Muscle weakness (generalized): Secondary | ICD-10-CM | POA: Diagnosis not present

## 2020-09-19 DIAGNOSIS — J449 Chronic obstructive pulmonary disease, unspecified: Secondary | ICD-10-CM | POA: Diagnosis not present

## 2020-09-19 DIAGNOSIS — R2681 Unsteadiness on feet: Secondary | ICD-10-CM | POA: Diagnosis not present

## 2020-09-19 DIAGNOSIS — M6281 Muscle weakness (generalized): Secondary | ICD-10-CM | POA: Diagnosis not present

## 2020-09-19 DIAGNOSIS — I129 Hypertensive chronic kidney disease with stage 1 through stage 4 chronic kidney disease, or unspecified chronic kidney disease: Secondary | ICD-10-CM | POA: Diagnosis not present

## 2020-09-19 DIAGNOSIS — I69354 Hemiplegia and hemiparesis following cerebral infarction affecting left non-dominant side: Secondary | ICD-10-CM | POA: Diagnosis not present

## 2020-09-20 DIAGNOSIS — R2681 Unsteadiness on feet: Secondary | ICD-10-CM | POA: Diagnosis not present

## 2020-09-20 DIAGNOSIS — I69354 Hemiplegia and hemiparesis following cerebral infarction affecting left non-dominant side: Secondary | ICD-10-CM | POA: Diagnosis not present

## 2020-09-20 DIAGNOSIS — M6281 Muscle weakness (generalized): Secondary | ICD-10-CM | POA: Diagnosis not present

## 2020-09-20 DIAGNOSIS — I129 Hypertensive chronic kidney disease with stage 1 through stage 4 chronic kidney disease, or unspecified chronic kidney disease: Secondary | ICD-10-CM | POA: Diagnosis not present

## 2020-09-20 DIAGNOSIS — J449 Chronic obstructive pulmonary disease, unspecified: Secondary | ICD-10-CM | POA: Diagnosis not present

## 2020-09-21 DIAGNOSIS — R2681 Unsteadiness on feet: Secondary | ICD-10-CM | POA: Diagnosis not present

## 2020-09-21 DIAGNOSIS — I129 Hypertensive chronic kidney disease with stage 1 through stage 4 chronic kidney disease, or unspecified chronic kidney disease: Secondary | ICD-10-CM | POA: Diagnosis not present

## 2020-09-21 DIAGNOSIS — M6281 Muscle weakness (generalized): Secondary | ICD-10-CM | POA: Diagnosis not present

## 2020-09-21 DIAGNOSIS — J449 Chronic obstructive pulmonary disease, unspecified: Secondary | ICD-10-CM | POA: Diagnosis not present

## 2020-09-21 DIAGNOSIS — I69354 Hemiplegia and hemiparesis following cerebral infarction affecting left non-dominant side: Secondary | ICD-10-CM | POA: Diagnosis not present

## 2020-09-23 DIAGNOSIS — M6281 Muscle weakness (generalized): Secondary | ICD-10-CM | POA: Diagnosis not present

## 2020-09-23 DIAGNOSIS — J449 Chronic obstructive pulmonary disease, unspecified: Secondary | ICD-10-CM | POA: Diagnosis not present

## 2020-09-23 DIAGNOSIS — R2681 Unsteadiness on feet: Secondary | ICD-10-CM | POA: Diagnosis not present

## 2020-09-23 DIAGNOSIS — I69354 Hemiplegia and hemiparesis following cerebral infarction affecting left non-dominant side: Secondary | ICD-10-CM | POA: Diagnosis not present

## 2020-09-23 DIAGNOSIS — I129 Hypertensive chronic kidney disease with stage 1 through stage 4 chronic kidney disease, or unspecified chronic kidney disease: Secondary | ICD-10-CM | POA: Diagnosis not present

## 2020-09-24 DIAGNOSIS — I69354 Hemiplegia and hemiparesis following cerebral infarction affecting left non-dominant side: Secondary | ICD-10-CM | POA: Diagnosis not present

## 2020-09-24 DIAGNOSIS — M6281 Muscle weakness (generalized): Secondary | ICD-10-CM | POA: Diagnosis not present

## 2020-09-24 DIAGNOSIS — R2681 Unsteadiness on feet: Secondary | ICD-10-CM | POA: Diagnosis not present

## 2020-09-24 DIAGNOSIS — J449 Chronic obstructive pulmonary disease, unspecified: Secondary | ICD-10-CM | POA: Diagnosis not present

## 2020-09-24 DIAGNOSIS — I129 Hypertensive chronic kidney disease with stage 1 through stage 4 chronic kidney disease, or unspecified chronic kidney disease: Secondary | ICD-10-CM | POA: Diagnosis not present

## 2020-09-25 DIAGNOSIS — R2681 Unsteadiness on feet: Secondary | ICD-10-CM | POA: Diagnosis not present

## 2020-09-25 DIAGNOSIS — I69354 Hemiplegia and hemiparesis following cerebral infarction affecting left non-dominant side: Secondary | ICD-10-CM | POA: Diagnosis not present

## 2020-09-25 DIAGNOSIS — M6281 Muscle weakness (generalized): Secondary | ICD-10-CM | POA: Diagnosis not present

## 2020-09-25 DIAGNOSIS — J449 Chronic obstructive pulmonary disease, unspecified: Secondary | ICD-10-CM | POA: Diagnosis not present

## 2020-09-25 DIAGNOSIS — I129 Hypertensive chronic kidney disease with stage 1 through stage 4 chronic kidney disease, or unspecified chronic kidney disease: Secondary | ICD-10-CM | POA: Diagnosis not present

## 2020-09-26 DIAGNOSIS — F0151 Vascular dementia with behavioral disturbance: Secondary | ICD-10-CM | POA: Diagnosis not present

## 2020-09-26 DIAGNOSIS — I129 Hypertensive chronic kidney disease with stage 1 through stage 4 chronic kidney disease, or unspecified chronic kidney disease: Secondary | ICD-10-CM | POA: Diagnosis not present

## 2020-09-26 DIAGNOSIS — F339 Major depressive disorder, recurrent, unspecified: Secondary | ICD-10-CM | POA: Diagnosis not present

## 2020-09-26 DIAGNOSIS — J449 Chronic obstructive pulmonary disease, unspecified: Secondary | ICD-10-CM | POA: Diagnosis not present

## 2020-09-26 DIAGNOSIS — R441 Visual hallucinations: Secondary | ICD-10-CM | POA: Diagnosis not present

## 2020-09-26 DIAGNOSIS — R2681 Unsteadiness on feet: Secondary | ICD-10-CM | POA: Diagnosis not present

## 2020-09-26 DIAGNOSIS — M6281 Muscle weakness (generalized): Secondary | ICD-10-CM | POA: Diagnosis not present

## 2020-09-26 DIAGNOSIS — I69354 Hemiplegia and hemiparesis following cerebral infarction affecting left non-dominant side: Secondary | ICD-10-CM | POA: Diagnosis not present

## 2020-09-27 DIAGNOSIS — I69354 Hemiplegia and hemiparesis following cerebral infarction affecting left non-dominant side: Secondary | ICD-10-CM | POA: Diagnosis not present

## 2020-09-27 DIAGNOSIS — F419 Anxiety disorder, unspecified: Secondary | ICD-10-CM | POA: Diagnosis not present

## 2020-09-27 DIAGNOSIS — F29 Unspecified psychosis not due to a substance or known physiological condition: Secondary | ICD-10-CM | POA: Diagnosis not present

## 2020-09-27 DIAGNOSIS — M6281 Muscle weakness (generalized): Secondary | ICD-10-CM | POA: Diagnosis not present

## 2020-09-27 DIAGNOSIS — I129 Hypertensive chronic kidney disease with stage 1 through stage 4 chronic kidney disease, or unspecified chronic kidney disease: Secondary | ICD-10-CM | POA: Diagnosis not present

## 2020-09-27 DIAGNOSIS — R2681 Unsteadiness on feet: Secondary | ICD-10-CM | POA: Diagnosis not present

## 2020-09-27 DIAGNOSIS — J449 Chronic obstructive pulmonary disease, unspecified: Secondary | ICD-10-CM | POA: Diagnosis not present

## 2020-09-30 DIAGNOSIS — I129 Hypertensive chronic kidney disease with stage 1 through stage 4 chronic kidney disease, or unspecified chronic kidney disease: Secondary | ICD-10-CM | POA: Diagnosis not present

## 2020-09-30 DIAGNOSIS — R2681 Unsteadiness on feet: Secondary | ICD-10-CM | POA: Diagnosis not present

## 2020-09-30 DIAGNOSIS — I69354 Hemiplegia and hemiparesis following cerebral infarction affecting left non-dominant side: Secondary | ICD-10-CM | POA: Diagnosis not present

## 2020-09-30 DIAGNOSIS — J449 Chronic obstructive pulmonary disease, unspecified: Secondary | ICD-10-CM | POA: Diagnosis not present

## 2020-09-30 DIAGNOSIS — M6281 Muscle weakness (generalized): Secondary | ICD-10-CM | POA: Diagnosis not present

## 2020-10-01 DIAGNOSIS — I69354 Hemiplegia and hemiparesis following cerebral infarction affecting left non-dominant side: Secondary | ICD-10-CM | POA: Diagnosis not present

## 2020-10-01 DIAGNOSIS — J449 Chronic obstructive pulmonary disease, unspecified: Secondary | ICD-10-CM | POA: Diagnosis not present

## 2020-10-01 DIAGNOSIS — I129 Hypertensive chronic kidney disease with stage 1 through stage 4 chronic kidney disease, or unspecified chronic kidney disease: Secondary | ICD-10-CM | POA: Diagnosis not present

## 2020-10-01 DIAGNOSIS — R2681 Unsteadiness on feet: Secondary | ICD-10-CM | POA: Diagnosis not present

## 2020-10-01 DIAGNOSIS — M6281 Muscle weakness (generalized): Secondary | ICD-10-CM | POA: Diagnosis not present

## 2020-10-02 DIAGNOSIS — I129 Hypertensive chronic kidney disease with stage 1 through stage 4 chronic kidney disease, or unspecified chronic kidney disease: Secondary | ICD-10-CM | POA: Diagnosis not present

## 2020-10-02 DIAGNOSIS — J449 Chronic obstructive pulmonary disease, unspecified: Secondary | ICD-10-CM | POA: Diagnosis not present

## 2020-10-02 DIAGNOSIS — R2681 Unsteadiness on feet: Secondary | ICD-10-CM | POA: Diagnosis not present

## 2020-10-02 DIAGNOSIS — I69354 Hemiplegia and hemiparesis following cerebral infarction affecting left non-dominant side: Secondary | ICD-10-CM | POA: Diagnosis not present

## 2020-10-02 DIAGNOSIS — M6281 Muscle weakness (generalized): Secondary | ICD-10-CM | POA: Diagnosis not present

## 2020-10-03 DIAGNOSIS — I129 Hypertensive chronic kidney disease with stage 1 through stage 4 chronic kidney disease, or unspecified chronic kidney disease: Secondary | ICD-10-CM | POA: Diagnosis not present

## 2020-10-03 DIAGNOSIS — I69354 Hemiplegia and hemiparesis following cerebral infarction affecting left non-dominant side: Secondary | ICD-10-CM | POA: Diagnosis not present

## 2020-10-03 DIAGNOSIS — M6281 Muscle weakness (generalized): Secondary | ICD-10-CM | POA: Diagnosis not present

## 2020-10-03 DIAGNOSIS — J449 Chronic obstructive pulmonary disease, unspecified: Secondary | ICD-10-CM | POA: Diagnosis not present

## 2020-10-03 DIAGNOSIS — R2681 Unsteadiness on feet: Secondary | ICD-10-CM | POA: Diagnosis not present

## 2020-10-04 DIAGNOSIS — J449 Chronic obstructive pulmonary disease, unspecified: Secondary | ICD-10-CM | POA: Diagnosis not present

## 2020-10-04 DIAGNOSIS — I129 Hypertensive chronic kidney disease with stage 1 through stage 4 chronic kidney disease, or unspecified chronic kidney disease: Secondary | ICD-10-CM | POA: Diagnosis not present

## 2020-10-04 DIAGNOSIS — M6281 Muscle weakness (generalized): Secondary | ICD-10-CM | POA: Diagnosis not present

## 2020-10-04 DIAGNOSIS — I69354 Hemiplegia and hemiparesis following cerebral infarction affecting left non-dominant side: Secondary | ICD-10-CM | POA: Diagnosis not present

## 2020-10-04 DIAGNOSIS — R2681 Unsteadiness on feet: Secondary | ICD-10-CM | POA: Diagnosis not present

## 2020-10-07 DIAGNOSIS — I129 Hypertensive chronic kidney disease with stage 1 through stage 4 chronic kidney disease, or unspecified chronic kidney disease: Secondary | ICD-10-CM | POA: Diagnosis not present

## 2020-10-07 DIAGNOSIS — M6281 Muscle weakness (generalized): Secondary | ICD-10-CM | POA: Diagnosis not present

## 2020-10-07 DIAGNOSIS — R2681 Unsteadiness on feet: Secondary | ICD-10-CM | POA: Diagnosis not present

## 2020-10-07 DIAGNOSIS — I69354 Hemiplegia and hemiparesis following cerebral infarction affecting left non-dominant side: Secondary | ICD-10-CM | POA: Diagnosis not present

## 2020-10-07 DIAGNOSIS — J449 Chronic obstructive pulmonary disease, unspecified: Secondary | ICD-10-CM | POA: Diagnosis not present

## 2020-10-08 DIAGNOSIS — J449 Chronic obstructive pulmonary disease, unspecified: Secondary | ICD-10-CM | POA: Diagnosis not present

## 2020-10-08 DIAGNOSIS — I129 Hypertensive chronic kidney disease with stage 1 through stage 4 chronic kidney disease, or unspecified chronic kidney disease: Secondary | ICD-10-CM | POA: Diagnosis not present

## 2020-10-08 DIAGNOSIS — I69354 Hemiplegia and hemiparesis following cerebral infarction affecting left non-dominant side: Secondary | ICD-10-CM | POA: Diagnosis not present

## 2020-10-08 DIAGNOSIS — R2681 Unsteadiness on feet: Secondary | ICD-10-CM | POA: Diagnosis not present

## 2020-10-08 DIAGNOSIS — M6281 Muscle weakness (generalized): Secondary | ICD-10-CM | POA: Diagnosis not present

## 2020-10-09 DIAGNOSIS — I69354 Hemiplegia and hemiparesis following cerebral infarction affecting left non-dominant side: Secondary | ICD-10-CM | POA: Diagnosis not present

## 2020-10-09 DIAGNOSIS — M6281 Muscle weakness (generalized): Secondary | ICD-10-CM | POA: Diagnosis not present

## 2020-10-09 DIAGNOSIS — R2681 Unsteadiness on feet: Secondary | ICD-10-CM | POA: Diagnosis not present

## 2020-10-09 DIAGNOSIS — J449 Chronic obstructive pulmonary disease, unspecified: Secondary | ICD-10-CM | POA: Diagnosis not present

## 2020-10-09 DIAGNOSIS — I129 Hypertensive chronic kidney disease with stage 1 through stage 4 chronic kidney disease, or unspecified chronic kidney disease: Secondary | ICD-10-CM | POA: Diagnosis not present

## 2020-10-10 DIAGNOSIS — I129 Hypertensive chronic kidney disease with stage 1 through stage 4 chronic kidney disease, or unspecified chronic kidney disease: Secondary | ICD-10-CM | POA: Diagnosis not present

## 2020-10-10 DIAGNOSIS — M6281 Muscle weakness (generalized): Secondary | ICD-10-CM | POA: Diagnosis not present

## 2020-10-10 DIAGNOSIS — I69354 Hemiplegia and hemiparesis following cerebral infarction affecting left non-dominant side: Secondary | ICD-10-CM | POA: Diagnosis not present

## 2020-10-10 DIAGNOSIS — J449 Chronic obstructive pulmonary disease, unspecified: Secondary | ICD-10-CM | POA: Diagnosis not present

## 2020-10-10 DIAGNOSIS — R2681 Unsteadiness on feet: Secondary | ICD-10-CM | POA: Diagnosis not present

## 2020-10-11 DIAGNOSIS — I69354 Hemiplegia and hemiparesis following cerebral infarction affecting left non-dominant side: Secondary | ICD-10-CM | POA: Diagnosis not present

## 2020-10-11 DIAGNOSIS — I129 Hypertensive chronic kidney disease with stage 1 through stage 4 chronic kidney disease, or unspecified chronic kidney disease: Secondary | ICD-10-CM | POA: Diagnosis not present

## 2020-10-11 DIAGNOSIS — J449 Chronic obstructive pulmonary disease, unspecified: Secondary | ICD-10-CM | POA: Diagnosis not present

## 2020-10-11 DIAGNOSIS — M6281 Muscle weakness (generalized): Secondary | ICD-10-CM | POA: Diagnosis not present

## 2020-10-11 DIAGNOSIS — R2681 Unsteadiness on feet: Secondary | ICD-10-CM | POA: Diagnosis not present

## 2020-10-24 DIAGNOSIS — F0151 Vascular dementia with behavioral disturbance: Secondary | ICD-10-CM | POA: Diagnosis not present

## 2020-10-24 DIAGNOSIS — F339 Major depressive disorder, recurrent, unspecified: Secondary | ICD-10-CM | POA: Diagnosis not present

## 2020-10-24 DIAGNOSIS — R441 Visual hallucinations: Secondary | ICD-10-CM | POA: Diagnosis not present

## 2020-10-25 DIAGNOSIS — F0390 Unspecified dementia without behavioral disturbance: Secondary | ICD-10-CM | POA: Diagnosis not present

## 2020-10-25 DIAGNOSIS — J449 Chronic obstructive pulmonary disease, unspecified: Secondary | ICD-10-CM | POA: Diagnosis not present

## 2020-10-25 DIAGNOSIS — I639 Cerebral infarction, unspecified: Secondary | ICD-10-CM | POA: Diagnosis not present

## 2020-10-25 DIAGNOSIS — G8194 Hemiplegia, unspecified affecting left nondominant side: Secondary | ICD-10-CM | POA: Diagnosis not present

## 2020-11-07 DIAGNOSIS — F0151 Vascular dementia with behavioral disturbance: Secondary | ICD-10-CM | POA: Diagnosis not present

## 2020-11-07 DIAGNOSIS — F339 Major depressive disorder, recurrent, unspecified: Secondary | ICD-10-CM | POA: Diagnosis not present

## 2020-11-07 DIAGNOSIS — M1711 Unilateral primary osteoarthritis, right knee: Secondary | ICD-10-CM | POA: Diagnosis not present

## 2020-11-07 DIAGNOSIS — M25561 Pain in right knee: Secondary | ICD-10-CM | POA: Diagnosis not present

## 2020-11-07 DIAGNOSIS — M25761 Osteophyte, right knee: Secondary | ICD-10-CM | POA: Diagnosis not present

## 2020-11-07 DIAGNOSIS — R441 Visual hallucinations: Secondary | ICD-10-CM | POA: Diagnosis not present

## 2020-11-07 DIAGNOSIS — R443 Hallucinations, unspecified: Secondary | ICD-10-CM | POA: Diagnosis not present

## 2020-11-15 DIAGNOSIS — R1312 Dysphagia, oropharyngeal phase: Secondary | ICD-10-CM | POA: Diagnosis not present

## 2020-11-17 DIAGNOSIS — R1312 Dysphagia, oropharyngeal phase: Secondary | ICD-10-CM | POA: Diagnosis not present

## 2020-11-18 DIAGNOSIS — R1312 Dysphagia, oropharyngeal phase: Secondary | ICD-10-CM | POA: Diagnosis not present

## 2020-11-20 DIAGNOSIS — R1312 Dysphagia, oropharyngeal phase: Secondary | ICD-10-CM | POA: Diagnosis not present

## 2020-11-22 DIAGNOSIS — R1312 Dysphagia, oropharyngeal phase: Secondary | ICD-10-CM | POA: Diagnosis not present

## 2020-11-23 DIAGNOSIS — R791 Abnormal coagulation profile: Secondary | ICD-10-CM | POA: Diagnosis not present

## 2020-11-24 DIAGNOSIS — R1312 Dysphagia, oropharyngeal phase: Secondary | ICD-10-CM | POA: Diagnosis not present

## 2020-11-25 DIAGNOSIS — R1312 Dysphagia, oropharyngeal phase: Secondary | ICD-10-CM | POA: Diagnosis not present

## 2020-11-26 DIAGNOSIS — R1312 Dysphagia, oropharyngeal phase: Secondary | ICD-10-CM | POA: Diagnosis not present

## 2020-11-27 DIAGNOSIS — R443 Hallucinations, unspecified: Secondary | ICD-10-CM | POA: Diagnosis not present

## 2020-11-27 DIAGNOSIS — Z23 Encounter for immunization: Secondary | ICD-10-CM | POA: Diagnosis not present

## 2020-11-27 DIAGNOSIS — I639 Cerebral infarction, unspecified: Secondary | ICD-10-CM | POA: Diagnosis not present

## 2020-11-27 DIAGNOSIS — F0391 Unspecified dementia with behavioral disturbance: Secondary | ICD-10-CM | POA: Diagnosis not present

## 2020-11-27 DIAGNOSIS — G8194 Hemiplegia, unspecified affecting left nondominant side: Secondary | ICD-10-CM | POA: Diagnosis not present

## 2020-11-27 DIAGNOSIS — R1312 Dysphagia, oropharyngeal phase: Secondary | ICD-10-CM | POA: Diagnosis not present

## 2020-11-28 DIAGNOSIS — N390 Urinary tract infection, site not specified: Secondary | ICD-10-CM | POA: Diagnosis not present

## 2020-11-28 DIAGNOSIS — I10 Essential (primary) hypertension: Secondary | ICD-10-CM | POA: Diagnosis not present

## 2020-11-28 DIAGNOSIS — F339 Major depressive disorder, recurrent, unspecified: Secondary | ICD-10-CM | POA: Diagnosis not present

## 2020-11-28 DIAGNOSIS — R441 Visual hallucinations: Secondary | ICD-10-CM | POA: Diagnosis not present

## 2020-11-28 DIAGNOSIS — F0151 Vascular dementia with behavioral disturbance: Secondary | ICD-10-CM | POA: Diagnosis not present

## 2020-11-28 DIAGNOSIS — E1122 Type 2 diabetes mellitus with diabetic chronic kidney disease: Secondary | ICD-10-CM | POA: Diagnosis not present

## 2020-11-29 DIAGNOSIS — R1312 Dysphagia, oropharyngeal phase: Secondary | ICD-10-CM | POA: Diagnosis not present

## 2020-12-01 DIAGNOSIS — R1312 Dysphagia, oropharyngeal phase: Secondary | ICD-10-CM | POA: Diagnosis not present

## 2020-12-02 DIAGNOSIS — R1312 Dysphagia, oropharyngeal phase: Secondary | ICD-10-CM | POA: Diagnosis not present

## 2020-12-03 DIAGNOSIS — R1312 Dysphagia, oropharyngeal phase: Secondary | ICD-10-CM | POA: Diagnosis not present

## 2020-12-04 DIAGNOSIS — R1312 Dysphagia, oropharyngeal phase: Secondary | ICD-10-CM | POA: Diagnosis not present

## 2020-12-06 DIAGNOSIS — R1312 Dysphagia, oropharyngeal phase: Secondary | ICD-10-CM | POA: Diagnosis not present

## 2020-12-19 DIAGNOSIS — R441 Visual hallucinations: Secondary | ICD-10-CM | POA: Diagnosis not present

## 2020-12-19 DIAGNOSIS — F0151 Vascular dementia with behavioral disturbance: Secondary | ICD-10-CM | POA: Diagnosis not present

## 2020-12-19 DIAGNOSIS — F339 Major depressive disorder, recurrent, unspecified: Secondary | ICD-10-CM | POA: Diagnosis not present

## 2020-12-26 DIAGNOSIS — F0391 Unspecified dementia with behavioral disturbance: Secondary | ICD-10-CM | POA: Diagnosis not present

## 2020-12-26 DIAGNOSIS — Z9181 History of falling: Secondary | ICD-10-CM | POA: Diagnosis not present

## 2021-01-01 DIAGNOSIS — I5030 Unspecified diastolic (congestive) heart failure: Secondary | ICD-10-CM | POA: Diagnosis not present

## 2021-01-01 DIAGNOSIS — R300 Dysuria: Secondary | ICD-10-CM | POA: Diagnosis not present

## 2021-01-01 DIAGNOSIS — F0391 Unspecified dementia with behavioral disturbance: Secondary | ICD-10-CM | POA: Diagnosis not present

## 2021-01-01 DIAGNOSIS — I482 Chronic atrial fibrillation, unspecified: Secondary | ICD-10-CM | POA: Diagnosis not present

## 2021-01-02 DIAGNOSIS — I4891 Unspecified atrial fibrillation: Secondary | ICD-10-CM | POA: Diagnosis not present

## 2021-01-02 DIAGNOSIS — I10 Essential (primary) hypertension: Secondary | ICD-10-CM | POA: Diagnosis not present

## 2021-01-03 DIAGNOSIS — N390 Urinary tract infection, site not specified: Secondary | ICD-10-CM | POA: Diagnosis not present

## 2021-01-08 DIAGNOSIS — M25522 Pain in left elbow: Secondary | ICD-10-CM | POA: Diagnosis not present

## 2021-01-08 DIAGNOSIS — M25512 Pain in left shoulder: Secondary | ICD-10-CM | POA: Diagnosis not present

## 2021-01-09 DIAGNOSIS — M25522 Pain in left elbow: Secondary | ICD-10-CM | POA: Diagnosis not present

## 2021-01-10 DIAGNOSIS — I482 Chronic atrial fibrillation, unspecified: Secondary | ICD-10-CM | POA: Diagnosis not present

## 2021-01-16 DIAGNOSIS — F339 Major depressive disorder, recurrent, unspecified: Secondary | ICD-10-CM | POA: Diagnosis not present

## 2021-01-16 DIAGNOSIS — M6281 Muscle weakness (generalized): Secondary | ICD-10-CM | POA: Diagnosis not present

## 2021-01-16 DIAGNOSIS — J449 Chronic obstructive pulmonary disease, unspecified: Secondary | ICD-10-CM | POA: Diagnosis not present

## 2021-01-16 DIAGNOSIS — I129 Hypertensive chronic kidney disease with stage 1 through stage 4 chronic kidney disease, or unspecified chronic kidney disease: Secondary | ICD-10-CM | POA: Diagnosis not present

## 2021-01-16 DIAGNOSIS — I69354 Hemiplegia and hemiparesis following cerebral infarction affecting left non-dominant side: Secondary | ICD-10-CM | POA: Diagnosis not present

## 2021-01-16 DIAGNOSIS — Z9181 History of falling: Secondary | ICD-10-CM | POA: Diagnosis not present

## 2021-01-16 DIAGNOSIS — F0151 Vascular dementia with behavioral disturbance: Secondary | ICD-10-CM | POA: Diagnosis not present

## 2021-01-16 DIAGNOSIS — R441 Visual hallucinations: Secondary | ICD-10-CM | POA: Diagnosis not present

## 2021-01-17 DIAGNOSIS — Z9181 History of falling: Secondary | ICD-10-CM | POA: Diagnosis not present

## 2021-01-17 DIAGNOSIS — I129 Hypertensive chronic kidney disease with stage 1 through stage 4 chronic kidney disease, or unspecified chronic kidney disease: Secondary | ICD-10-CM | POA: Diagnosis not present

## 2021-01-17 DIAGNOSIS — J449 Chronic obstructive pulmonary disease, unspecified: Secondary | ICD-10-CM | POA: Diagnosis not present

## 2021-01-17 DIAGNOSIS — I69354 Hemiplegia and hemiparesis following cerebral infarction affecting left non-dominant side: Secondary | ICD-10-CM | POA: Diagnosis not present

## 2021-01-17 DIAGNOSIS — M6281 Muscle weakness (generalized): Secondary | ICD-10-CM | POA: Diagnosis not present

## 2021-01-18 DIAGNOSIS — J449 Chronic obstructive pulmonary disease, unspecified: Secondary | ICD-10-CM | POA: Diagnosis not present

## 2021-01-18 DIAGNOSIS — I69354 Hemiplegia and hemiparesis following cerebral infarction affecting left non-dominant side: Secondary | ICD-10-CM | POA: Diagnosis not present

## 2021-01-18 DIAGNOSIS — M6281 Muscle weakness (generalized): Secondary | ICD-10-CM | POA: Diagnosis not present

## 2021-01-18 DIAGNOSIS — Z9181 History of falling: Secondary | ICD-10-CM | POA: Diagnosis not present

## 2021-01-18 DIAGNOSIS — I129 Hypertensive chronic kidney disease with stage 1 through stage 4 chronic kidney disease, or unspecified chronic kidney disease: Secondary | ICD-10-CM | POA: Diagnosis not present

## 2021-01-19 DIAGNOSIS — M6281 Muscle weakness (generalized): Secondary | ICD-10-CM | POA: Diagnosis not present

## 2021-01-19 DIAGNOSIS — I69354 Hemiplegia and hemiparesis following cerebral infarction affecting left non-dominant side: Secondary | ICD-10-CM | POA: Diagnosis not present

## 2021-01-19 DIAGNOSIS — Z9181 History of falling: Secondary | ICD-10-CM | POA: Diagnosis not present

## 2021-01-19 DIAGNOSIS — J449 Chronic obstructive pulmonary disease, unspecified: Secondary | ICD-10-CM | POA: Diagnosis not present

## 2021-01-19 DIAGNOSIS — I129 Hypertensive chronic kidney disease with stage 1 through stage 4 chronic kidney disease, or unspecified chronic kidney disease: Secondary | ICD-10-CM | POA: Diagnosis not present

## 2021-01-20 DIAGNOSIS — I69354 Hemiplegia and hemiparesis following cerebral infarction affecting left non-dominant side: Secondary | ICD-10-CM | POA: Diagnosis not present

## 2021-01-20 DIAGNOSIS — Z9181 History of falling: Secondary | ICD-10-CM | POA: Diagnosis not present

## 2021-01-20 DIAGNOSIS — I129 Hypertensive chronic kidney disease with stage 1 through stage 4 chronic kidney disease, or unspecified chronic kidney disease: Secondary | ICD-10-CM | POA: Diagnosis not present

## 2021-01-20 DIAGNOSIS — M6281 Muscle weakness (generalized): Secondary | ICD-10-CM | POA: Diagnosis not present

## 2021-01-20 DIAGNOSIS — J449 Chronic obstructive pulmonary disease, unspecified: Secondary | ICD-10-CM | POA: Diagnosis not present

## 2021-01-21 DIAGNOSIS — I129 Hypertensive chronic kidney disease with stage 1 through stage 4 chronic kidney disease, or unspecified chronic kidney disease: Secondary | ICD-10-CM | POA: Diagnosis not present

## 2021-01-21 DIAGNOSIS — Z9181 History of falling: Secondary | ICD-10-CM | POA: Diagnosis not present

## 2021-01-21 DIAGNOSIS — J449 Chronic obstructive pulmonary disease, unspecified: Secondary | ICD-10-CM | POA: Diagnosis not present

## 2021-01-21 DIAGNOSIS — M6281 Muscle weakness (generalized): Secondary | ICD-10-CM | POA: Diagnosis not present

## 2021-01-21 DIAGNOSIS — I69354 Hemiplegia and hemiparesis following cerebral infarction affecting left non-dominant side: Secondary | ICD-10-CM | POA: Diagnosis not present

## 2021-01-22 DIAGNOSIS — M6281 Muscle weakness (generalized): Secondary | ICD-10-CM | POA: Diagnosis not present

## 2021-01-22 DIAGNOSIS — Z9181 History of falling: Secondary | ICD-10-CM | POA: Diagnosis not present

## 2021-01-22 DIAGNOSIS — I129 Hypertensive chronic kidney disease with stage 1 through stage 4 chronic kidney disease, or unspecified chronic kidney disease: Secondary | ICD-10-CM | POA: Diagnosis not present

## 2021-01-22 DIAGNOSIS — I69354 Hemiplegia and hemiparesis following cerebral infarction affecting left non-dominant side: Secondary | ICD-10-CM | POA: Diagnosis not present

## 2021-01-22 DIAGNOSIS — J449 Chronic obstructive pulmonary disease, unspecified: Secondary | ICD-10-CM | POA: Diagnosis not present

## 2021-01-23 DIAGNOSIS — I129 Hypertensive chronic kidney disease with stage 1 through stage 4 chronic kidney disease, or unspecified chronic kidney disease: Secondary | ICD-10-CM | POA: Diagnosis not present

## 2021-01-23 DIAGNOSIS — M6281 Muscle weakness (generalized): Secondary | ICD-10-CM | POA: Diagnosis not present

## 2021-01-23 DIAGNOSIS — J449 Chronic obstructive pulmonary disease, unspecified: Secondary | ICD-10-CM | POA: Diagnosis not present

## 2021-01-23 DIAGNOSIS — I69354 Hemiplegia and hemiparesis following cerebral infarction affecting left non-dominant side: Secondary | ICD-10-CM | POA: Diagnosis not present

## 2021-01-23 DIAGNOSIS — Z9181 History of falling: Secondary | ICD-10-CM | POA: Diagnosis not present

## 2021-01-24 DIAGNOSIS — J449 Chronic obstructive pulmonary disease, unspecified: Secondary | ICD-10-CM | POA: Diagnosis not present

## 2021-01-24 DIAGNOSIS — I129 Hypertensive chronic kidney disease with stage 1 through stage 4 chronic kidney disease, or unspecified chronic kidney disease: Secondary | ICD-10-CM | POA: Diagnosis not present

## 2021-01-24 DIAGNOSIS — M6281 Muscle weakness (generalized): Secondary | ICD-10-CM | POA: Diagnosis not present

## 2021-01-24 DIAGNOSIS — Z9181 History of falling: Secondary | ICD-10-CM | POA: Diagnosis not present

## 2021-01-24 DIAGNOSIS — I69354 Hemiplegia and hemiparesis following cerebral infarction affecting left non-dominant side: Secondary | ICD-10-CM | POA: Diagnosis not present

## 2021-01-27 DIAGNOSIS — I129 Hypertensive chronic kidney disease with stage 1 through stage 4 chronic kidney disease, or unspecified chronic kidney disease: Secondary | ICD-10-CM | POA: Diagnosis not present

## 2021-01-27 DIAGNOSIS — M6281 Muscle weakness (generalized): Secondary | ICD-10-CM | POA: Diagnosis not present

## 2021-01-27 DIAGNOSIS — Z9181 History of falling: Secondary | ICD-10-CM | POA: Diagnosis not present

## 2021-01-27 DIAGNOSIS — J449 Chronic obstructive pulmonary disease, unspecified: Secondary | ICD-10-CM | POA: Diagnosis not present

## 2021-01-27 DIAGNOSIS — I69354 Hemiplegia and hemiparesis following cerebral infarction affecting left non-dominant side: Secondary | ICD-10-CM | POA: Diagnosis not present

## 2021-01-28 DIAGNOSIS — J449 Chronic obstructive pulmonary disease, unspecified: Secondary | ICD-10-CM | POA: Diagnosis not present

## 2021-01-28 DIAGNOSIS — M6281 Muscle weakness (generalized): Secondary | ICD-10-CM | POA: Diagnosis not present

## 2021-01-28 DIAGNOSIS — I69354 Hemiplegia and hemiparesis following cerebral infarction affecting left non-dominant side: Secondary | ICD-10-CM | POA: Diagnosis not present

## 2021-01-28 DIAGNOSIS — Z9181 History of falling: Secondary | ICD-10-CM | POA: Diagnosis not present

## 2021-01-28 DIAGNOSIS — I129 Hypertensive chronic kidney disease with stage 1 through stage 4 chronic kidney disease, or unspecified chronic kidney disease: Secondary | ICD-10-CM | POA: Diagnosis not present

## 2021-01-29 DIAGNOSIS — I69354 Hemiplegia and hemiparesis following cerebral infarction affecting left non-dominant side: Secondary | ICD-10-CM | POA: Diagnosis not present

## 2021-01-29 DIAGNOSIS — J449 Chronic obstructive pulmonary disease, unspecified: Secondary | ICD-10-CM | POA: Diagnosis not present

## 2021-01-29 DIAGNOSIS — Z9181 History of falling: Secondary | ICD-10-CM | POA: Diagnosis not present

## 2021-01-29 DIAGNOSIS — I129 Hypertensive chronic kidney disease with stage 1 through stage 4 chronic kidney disease, or unspecified chronic kidney disease: Secondary | ICD-10-CM | POA: Diagnosis not present

## 2021-01-29 DIAGNOSIS — M6281 Muscle weakness (generalized): Secondary | ICD-10-CM | POA: Diagnosis not present

## 2021-01-30 DIAGNOSIS — J449 Chronic obstructive pulmonary disease, unspecified: Secondary | ICD-10-CM | POA: Diagnosis not present

## 2021-01-30 DIAGNOSIS — Z9181 History of falling: Secondary | ICD-10-CM | POA: Diagnosis not present

## 2021-01-30 DIAGNOSIS — M6281 Muscle weakness (generalized): Secondary | ICD-10-CM | POA: Diagnosis not present

## 2021-01-30 DIAGNOSIS — I129 Hypertensive chronic kidney disease with stage 1 through stage 4 chronic kidney disease, or unspecified chronic kidney disease: Secondary | ICD-10-CM | POA: Diagnosis not present

## 2021-01-30 DIAGNOSIS — I69354 Hemiplegia and hemiparesis following cerebral infarction affecting left non-dominant side: Secondary | ICD-10-CM | POA: Diagnosis not present

## 2021-01-31 DIAGNOSIS — J449 Chronic obstructive pulmonary disease, unspecified: Secondary | ICD-10-CM | POA: Diagnosis not present

## 2021-01-31 DIAGNOSIS — Z9181 History of falling: Secondary | ICD-10-CM | POA: Diagnosis not present

## 2021-01-31 DIAGNOSIS — M6281 Muscle weakness (generalized): Secondary | ICD-10-CM | POA: Diagnosis not present

## 2021-01-31 DIAGNOSIS — I129 Hypertensive chronic kidney disease with stage 1 through stage 4 chronic kidney disease, or unspecified chronic kidney disease: Secondary | ICD-10-CM | POA: Diagnosis not present

## 2021-01-31 DIAGNOSIS — I69354 Hemiplegia and hemiparesis following cerebral infarction affecting left non-dominant side: Secondary | ICD-10-CM | POA: Diagnosis not present

## 2021-02-03 DIAGNOSIS — Z9181 History of falling: Secondary | ICD-10-CM | POA: Diagnosis not present

## 2021-02-03 DIAGNOSIS — I69354 Hemiplegia and hemiparesis following cerebral infarction affecting left non-dominant side: Secondary | ICD-10-CM | POA: Diagnosis not present

## 2021-02-03 DIAGNOSIS — M6281 Muscle weakness (generalized): Secondary | ICD-10-CM | POA: Diagnosis not present

## 2021-02-03 DIAGNOSIS — I129 Hypertensive chronic kidney disease with stage 1 through stage 4 chronic kidney disease, or unspecified chronic kidney disease: Secondary | ICD-10-CM | POA: Diagnosis not present

## 2021-02-03 DIAGNOSIS — J449 Chronic obstructive pulmonary disease, unspecified: Secondary | ICD-10-CM | POA: Diagnosis not present

## 2021-02-04 DIAGNOSIS — J449 Chronic obstructive pulmonary disease, unspecified: Secondary | ICD-10-CM | POA: Diagnosis not present

## 2021-02-04 DIAGNOSIS — I129 Hypertensive chronic kidney disease with stage 1 through stage 4 chronic kidney disease, or unspecified chronic kidney disease: Secondary | ICD-10-CM | POA: Diagnosis not present

## 2021-02-04 DIAGNOSIS — M6281 Muscle weakness (generalized): Secondary | ICD-10-CM | POA: Diagnosis not present

## 2021-02-04 DIAGNOSIS — I69354 Hemiplegia and hemiparesis following cerebral infarction affecting left non-dominant side: Secondary | ICD-10-CM | POA: Diagnosis not present

## 2021-02-04 DIAGNOSIS — Z9181 History of falling: Secondary | ICD-10-CM | POA: Diagnosis not present

## 2021-02-05 DIAGNOSIS — Z9181 History of falling: Secondary | ICD-10-CM | POA: Diagnosis not present

## 2021-02-05 DIAGNOSIS — I129 Hypertensive chronic kidney disease with stage 1 through stage 4 chronic kidney disease, or unspecified chronic kidney disease: Secondary | ICD-10-CM | POA: Diagnosis not present

## 2021-02-05 DIAGNOSIS — Z1331 Encounter for screening for depression: Secondary | ICD-10-CM | POA: Diagnosis not present

## 2021-02-05 DIAGNOSIS — Z1339 Encounter for screening examination for other mental health and behavioral disorders: Secondary | ICD-10-CM | POA: Diagnosis not present

## 2021-02-05 DIAGNOSIS — Z0000 Encounter for general adult medical examination without abnormal findings: Secondary | ICD-10-CM | POA: Diagnosis not present

## 2021-02-05 DIAGNOSIS — Z139 Encounter for screening, unspecified: Secondary | ICD-10-CM | POA: Diagnosis not present

## 2021-02-05 DIAGNOSIS — Z136 Encounter for screening for cardiovascular disorders: Secondary | ICD-10-CM | POA: Diagnosis not present

## 2021-02-05 DIAGNOSIS — J449 Chronic obstructive pulmonary disease, unspecified: Secondary | ICD-10-CM | POA: Diagnosis not present

## 2021-02-05 DIAGNOSIS — M6281 Muscle weakness (generalized): Secondary | ICD-10-CM | POA: Diagnosis not present

## 2021-02-05 DIAGNOSIS — I69354 Hemiplegia and hemiparesis following cerebral infarction affecting left non-dominant side: Secondary | ICD-10-CM | POA: Diagnosis not present

## 2021-02-06 DIAGNOSIS — Z9181 History of falling: Secondary | ICD-10-CM | POA: Diagnosis not present

## 2021-02-06 DIAGNOSIS — F0391 Unspecified dementia with behavioral disturbance: Secondary | ICD-10-CM | POA: Diagnosis not present

## 2021-02-06 DIAGNOSIS — I69354 Hemiplegia and hemiparesis following cerebral infarction affecting left non-dominant side: Secondary | ICD-10-CM | POA: Diagnosis not present

## 2021-02-06 DIAGNOSIS — M6281 Muscle weakness (generalized): Secondary | ICD-10-CM | POA: Diagnosis not present

## 2021-02-06 DIAGNOSIS — J449 Chronic obstructive pulmonary disease, unspecified: Secondary | ICD-10-CM | POA: Diagnosis not present

## 2021-02-06 DIAGNOSIS — D6869 Other thrombophilia: Secondary | ICD-10-CM | POA: Diagnosis not present

## 2021-02-06 DIAGNOSIS — I639 Cerebral infarction, unspecified: Secondary | ICD-10-CM | POA: Diagnosis not present

## 2021-02-06 DIAGNOSIS — I482 Chronic atrial fibrillation, unspecified: Secondary | ICD-10-CM | POA: Diagnosis not present

## 2021-02-06 DIAGNOSIS — I129 Hypertensive chronic kidney disease with stage 1 through stage 4 chronic kidney disease, or unspecified chronic kidney disease: Secondary | ICD-10-CM | POA: Diagnosis not present

## 2021-02-07 DIAGNOSIS — Z9181 History of falling: Secondary | ICD-10-CM | POA: Diagnosis not present

## 2021-02-07 DIAGNOSIS — J449 Chronic obstructive pulmonary disease, unspecified: Secondary | ICD-10-CM | POA: Diagnosis not present

## 2021-02-07 DIAGNOSIS — I69354 Hemiplegia and hemiparesis following cerebral infarction affecting left non-dominant side: Secondary | ICD-10-CM | POA: Diagnosis not present

## 2021-02-07 DIAGNOSIS — M6281 Muscle weakness (generalized): Secondary | ICD-10-CM | POA: Diagnosis not present

## 2021-02-07 DIAGNOSIS — I129 Hypertensive chronic kidney disease with stage 1 through stage 4 chronic kidney disease, or unspecified chronic kidney disease: Secondary | ICD-10-CM | POA: Diagnosis not present

## 2021-02-08 DIAGNOSIS — J449 Chronic obstructive pulmonary disease, unspecified: Secondary | ICD-10-CM | POA: Diagnosis not present

## 2021-02-08 DIAGNOSIS — Z9181 History of falling: Secondary | ICD-10-CM | POA: Diagnosis not present

## 2021-02-08 DIAGNOSIS — I69354 Hemiplegia and hemiparesis following cerebral infarction affecting left non-dominant side: Secondary | ICD-10-CM | POA: Diagnosis not present

## 2021-02-08 DIAGNOSIS — I129 Hypertensive chronic kidney disease with stage 1 through stage 4 chronic kidney disease, or unspecified chronic kidney disease: Secondary | ICD-10-CM | POA: Diagnosis not present

## 2021-02-08 DIAGNOSIS — M6281 Muscle weakness (generalized): Secondary | ICD-10-CM | POA: Diagnosis not present

## 2021-02-09 DIAGNOSIS — J449 Chronic obstructive pulmonary disease, unspecified: Secondary | ICD-10-CM | POA: Diagnosis not present

## 2021-02-09 DIAGNOSIS — I69354 Hemiplegia and hemiparesis following cerebral infarction affecting left non-dominant side: Secondary | ICD-10-CM | POA: Diagnosis not present

## 2021-02-09 DIAGNOSIS — Z9181 History of falling: Secondary | ICD-10-CM | POA: Diagnosis not present

## 2021-02-09 DIAGNOSIS — M6281 Muscle weakness (generalized): Secondary | ICD-10-CM | POA: Diagnosis not present

## 2021-02-09 DIAGNOSIS — I129 Hypertensive chronic kidney disease with stage 1 through stage 4 chronic kidney disease, or unspecified chronic kidney disease: Secondary | ICD-10-CM | POA: Diagnosis not present

## 2021-02-10 DIAGNOSIS — J449 Chronic obstructive pulmonary disease, unspecified: Secondary | ICD-10-CM | POA: Diagnosis not present

## 2021-02-10 DIAGNOSIS — I129 Hypertensive chronic kidney disease with stage 1 through stage 4 chronic kidney disease, or unspecified chronic kidney disease: Secondary | ICD-10-CM | POA: Diagnosis not present

## 2021-02-10 DIAGNOSIS — I69354 Hemiplegia and hemiparesis following cerebral infarction affecting left non-dominant side: Secondary | ICD-10-CM | POA: Diagnosis not present

## 2021-02-10 DIAGNOSIS — M6281 Muscle weakness (generalized): Secondary | ICD-10-CM | POA: Diagnosis not present

## 2021-02-10 DIAGNOSIS — Z9181 History of falling: Secondary | ICD-10-CM | POA: Diagnosis not present

## 2021-02-20 DIAGNOSIS — R441 Visual hallucinations: Secondary | ICD-10-CM | POA: Diagnosis not present

## 2021-02-20 DIAGNOSIS — F0151 Vascular dementia with behavioral disturbance: Secondary | ICD-10-CM | POA: Diagnosis not present

## 2021-02-20 DIAGNOSIS — F339 Major depressive disorder, recurrent, unspecified: Secondary | ICD-10-CM | POA: Diagnosis not present

## 2021-03-13 DIAGNOSIS — I639 Cerebral infarction, unspecified: Secondary | ICD-10-CM | POA: Diagnosis not present

## 2021-03-13 DIAGNOSIS — G8194 Hemiplegia, unspecified affecting left nondominant side: Secondary | ICD-10-CM | POA: Diagnosis not present

## 2021-03-13 DIAGNOSIS — J449 Chronic obstructive pulmonary disease, unspecified: Secondary | ICD-10-CM | POA: Diagnosis not present

## 2021-03-13 DIAGNOSIS — F0391 Unspecified dementia with behavioral disturbance: Secondary | ICD-10-CM | POA: Diagnosis not present

## 2021-03-14 DIAGNOSIS — I10 Essential (primary) hypertension: Secondary | ICD-10-CM | POA: Diagnosis not present

## 2021-03-20 DIAGNOSIS — F339 Major depressive disorder, recurrent, unspecified: Secondary | ICD-10-CM | POA: Diagnosis not present

## 2021-03-20 DIAGNOSIS — F0151 Vascular dementia with behavioral disturbance: Secondary | ICD-10-CM | POA: Diagnosis not present

## 2021-03-20 DIAGNOSIS — R441 Visual hallucinations: Secondary | ICD-10-CM | POA: Diagnosis not present

## 2021-04-15 DIAGNOSIS — E46 Unspecified protein-calorie malnutrition: Secondary | ICD-10-CM | POA: Diagnosis not present

## 2021-04-15 DIAGNOSIS — N1831 Chronic kidney disease, stage 3a: Secondary | ICD-10-CM | POA: Diagnosis not present

## 2021-04-15 DIAGNOSIS — I5030 Unspecified diastolic (congestive) heart failure: Secondary | ICD-10-CM | POA: Diagnosis not present

## 2021-04-15 DIAGNOSIS — R569 Unspecified convulsions: Secondary | ICD-10-CM | POA: Diagnosis not present

## 2021-04-17 DIAGNOSIS — F339 Major depressive disorder, recurrent, unspecified: Secondary | ICD-10-CM | POA: Diagnosis not present

## 2021-04-17 DIAGNOSIS — F01B2 Vascular dementia, moderate, with psychotic disturbance: Secondary | ICD-10-CM | POA: Diagnosis not present

## 2021-04-17 DIAGNOSIS — R441 Visual hallucinations: Secondary | ICD-10-CM | POA: Diagnosis not present

## 2021-04-23 ENCOUNTER — Other Ambulatory Visit: Payer: Self-pay

## 2021-04-23 ENCOUNTER — Emergency Department (HOSPITAL_COMMUNITY)
Admission: EM | Admit: 2021-04-23 | Discharge: 2021-04-24 | Disposition: A | Discharge status: Home / Self Care | Payer: Medicare Other | Attending: Emergency Medicine | Admitting: Emergency Medicine

## 2021-04-23 ENCOUNTER — Emergency Department (HOSPITAL_COMMUNITY): Payer: Medicare Other

## 2021-04-23 DIAGNOSIS — Z96642 Presence of left artificial hip joint: Secondary | ICD-10-CM | POA: Insufficient documentation

## 2021-04-23 DIAGNOSIS — R404 Transient alteration of awareness: Secondary | ICD-10-CM | POA: Diagnosis not present

## 2021-04-23 DIAGNOSIS — W01198A Fall on same level from slipping, tripping and stumbling with subsequent striking against other object, initial encounter: Secondary | ICD-10-CM | POA: Diagnosis not present

## 2021-04-23 DIAGNOSIS — I4891 Unspecified atrial fibrillation: Secondary | ICD-10-CM | POA: Diagnosis not present

## 2021-04-23 DIAGNOSIS — S0083XA Contusion of other part of head, initial encounter: Secondary | ICD-10-CM | POA: Diagnosis not present

## 2021-04-23 DIAGNOSIS — I10 Essential (primary) hypertension: Secondary | ICD-10-CM | POA: Diagnosis not present

## 2021-04-23 DIAGNOSIS — W19XXXA Unspecified fall, initial encounter: Secondary | ICD-10-CM | POA: Diagnosis not present

## 2021-04-23 DIAGNOSIS — B9689 Other specified bacterial agents as the cause of diseases classified elsewhere: Secondary | ICD-10-CM | POA: Insufficient documentation

## 2021-04-23 DIAGNOSIS — Z7901 Long term (current) use of anticoagulants: Secondary | ICD-10-CM | POA: Insufficient documentation

## 2021-04-23 DIAGNOSIS — J45909 Unspecified asthma, uncomplicated: Secondary | ICD-10-CM | POA: Insufficient documentation

## 2021-04-23 DIAGNOSIS — R509 Fever, unspecified: Secondary | ICD-10-CM | POA: Diagnosis not present

## 2021-04-23 DIAGNOSIS — M7989 Other specified soft tissue disorders: Secondary | ICD-10-CM | POA: Diagnosis not present

## 2021-04-23 DIAGNOSIS — Z7982 Long term (current) use of aspirin: Secondary | ICD-10-CM | POA: Diagnosis not present

## 2021-04-23 DIAGNOSIS — I509 Heart failure, unspecified: Secondary | ICD-10-CM | POA: Insufficient documentation

## 2021-04-23 DIAGNOSIS — N390 Urinary tract infection, site not specified: Secondary | ICD-10-CM | POA: Diagnosis not present

## 2021-04-23 DIAGNOSIS — Z7951 Long term (current) use of inhaled steroids: Secondary | ICD-10-CM | POA: Insufficient documentation

## 2021-04-23 DIAGNOSIS — R079 Chest pain, unspecified: Secondary | ICD-10-CM | POA: Diagnosis not present

## 2021-04-23 DIAGNOSIS — N183 Chronic kidney disease, stage 3 unspecified: Secondary | ICD-10-CM | POA: Diagnosis not present

## 2021-04-23 DIAGNOSIS — S0033XA Contusion of nose, initial encounter: Secondary | ICD-10-CM | POA: Diagnosis not present

## 2021-04-23 DIAGNOSIS — Z85038 Personal history of other malignant neoplasm of large intestine: Secondary | ICD-10-CM | POA: Diagnosis not present

## 2021-04-23 DIAGNOSIS — F0390 Unspecified dementia without behavioral disturbance: Secondary | ICD-10-CM | POA: Diagnosis not present

## 2021-04-23 DIAGNOSIS — Y92129 Unspecified place in nursing home as the place of occurrence of the external cause: Secondary | ICD-10-CM | POA: Insufficient documentation

## 2021-04-23 DIAGNOSIS — Z79899 Other long term (current) drug therapy: Secondary | ICD-10-CM | POA: Diagnosis not present

## 2021-04-23 DIAGNOSIS — I2510 Atherosclerotic heart disease of native coronary artery without angina pectoris: Secondary | ICD-10-CM | POA: Diagnosis not present

## 2021-04-23 DIAGNOSIS — Z7722 Contact with and (suspected) exposure to environmental tobacco smoke (acute) (chronic): Secondary | ICD-10-CM | POA: Diagnosis not present

## 2021-04-23 DIAGNOSIS — S0990XA Unspecified injury of head, initial encounter: Secondary | ICD-10-CM | POA: Diagnosis not present

## 2021-04-23 DIAGNOSIS — I130 Hypertensive heart and chronic kidney disease with heart failure and stage 1 through stage 4 chronic kidney disease, or unspecified chronic kidney disease: Secondary | ICD-10-CM | POA: Diagnosis not present

## 2021-04-23 DIAGNOSIS — Z86718 Personal history of other venous thrombosis and embolism: Secondary | ICD-10-CM | POA: Diagnosis not present

## 2021-04-23 DIAGNOSIS — M25562 Pain in left knee: Secondary | ICD-10-CM | POA: Diagnosis not present

## 2021-04-23 DIAGNOSIS — M25561 Pain in right knee: Secondary | ICD-10-CM | POA: Diagnosis not present

## 2021-04-23 DIAGNOSIS — R5383 Other fatigue: Secondary | ICD-10-CM | POA: Diagnosis not present

## 2021-04-23 DIAGNOSIS — R102 Pelvic and perineal pain: Secondary | ICD-10-CM | POA: Diagnosis not present

## 2021-04-23 DIAGNOSIS — R531 Weakness: Secondary | ICD-10-CM | POA: Diagnosis not present

## 2021-04-23 LAB — CBC WITH DIFFERENTIAL/PLATELET
Abs Immature Granulocytes: 0.03 10*3/uL (ref 0.00–0.07)
Basophils Absolute: 0 10*3/uL (ref 0.0–0.1)
Basophils Relative: 0 %
Eosinophils Absolute: 0.1 10*3/uL (ref 0.0–0.5)
Eosinophils Relative: 1 %
HCT: 41.8 % (ref 36.0–46.0)
Hemoglobin: 13.3 g/dL (ref 12.0–15.0)
Immature Granulocytes: 0 %
Lymphocytes Relative: 10 %
Lymphs Abs: 1.1 10*3/uL (ref 0.7–4.0)
MCH: 30.8 pg (ref 26.0–34.0)
MCHC: 31.8 g/dL (ref 30.0–36.0)
MCV: 96.8 fL (ref 80.0–100.0)
Monocytes Absolute: 0.7 10*3/uL (ref 0.1–1.0)
Monocytes Relative: 6 %
Neutro Abs: 9.2 10*3/uL — ABNORMAL HIGH (ref 1.7–7.7)
Neutrophils Relative %: 83 %
Platelets: 78 10*3/uL — ABNORMAL LOW (ref 150–400)
RBC: 4.32 MIL/uL (ref 3.87–5.11)
RDW: 14.5 % (ref 11.5–15.5)
WBC: 11.1 10*3/uL — ABNORMAL HIGH (ref 4.0–10.5)
nRBC: 0 % (ref 0.0–0.2)

## 2021-04-23 LAB — URINALYSIS, ROUTINE W REFLEX MICROSCOPIC
Bilirubin Urine: NEGATIVE
Bilirubin Urine: NEGATIVE
Glucose, UA: NEGATIVE mg/dL
Glucose, UA: NEGATIVE mg/dL
Ketones, ur: NEGATIVE mg/dL
Ketones, ur: NEGATIVE mg/dL
Nitrite: POSITIVE — AB
Nitrite: POSITIVE — AB
Protein, ur: 30 mg/dL — AB
Protein, ur: 30 mg/dL — AB
Renal Epithelial: 60
Specific Gravity, Urine: 1.021 (ref 1.005–1.030)
Specific Gravity, Urine: 1.021 (ref 1.005–1.030)
pH: 7 (ref 5.0–8.0)
pH: 7 (ref 5.0–8.0)

## 2021-04-23 LAB — COMPREHENSIVE METABOLIC PANEL
ALT: 13 U/L (ref 0–44)
AST: 27 U/L (ref 15–41)
Albumin: 3.5 g/dL (ref 3.5–5.0)
Alkaline Phosphatase: 54 U/L (ref 38–126)
Anion gap: 7 (ref 5–15)
BUN: 22 mg/dL (ref 8–23)
CO2: 26 mmol/L (ref 22–32)
Calcium: 9.3 mg/dL (ref 8.9–10.3)
Chloride: 112 mmol/L — ABNORMAL HIGH (ref 98–111)
Creatinine, Ser: 1.04 mg/dL — ABNORMAL HIGH (ref 0.44–1.00)
GFR, Estimated: 50 mL/min — ABNORMAL LOW (ref >60)
Glucose, Bld: 112 mg/dL — ABNORMAL HIGH (ref 70–99)
Potassium: 3.8 mmol/L (ref 3.5–5.1)
Sodium: 145 mmol/L (ref 135–145)
Total Bilirubin: 0.7 mg/dL (ref 0.3–1.2)
Total Protein: 6 g/dL — ABNORMAL LOW (ref 6.5–8.1)

## 2021-04-23 LAB — URINALYSIS, MICROSCOPIC (REFLEX)

## 2021-04-23 MED ORDER — ACETAMINOPHEN 325 MG PO TABS
650.0000 mg | ORAL_TABLET | Freq: Once | ORAL | Status: AC
  Administered 2021-04-23: 650 mg via ORAL
  Filled 2021-04-23: qty 2

## 2021-04-23 MED ORDER — CEPHALEXIN 250 MG PO CAPS
500.0000 mg | ORAL_CAPSULE | Freq: Once | ORAL | Status: AC
  Administered 2021-04-23: 500 mg via ORAL
  Filled 2021-04-23: qty 2

## 2021-04-23 MED ORDER — CEPHALEXIN 500 MG PO CAPS: 500.0000 mg | ORAL_CAPSULE | Freq: Four times a day (QID) | ORAL | 0 refills | Status: DC

## 2021-04-23 NOTE — ED Notes (Signed)
[name], daughter, [phone] would like an update when available

## 2021-04-23 NOTE — ED Provider Notes (Signed)
[facility] EMERGENCY DEPARTMENT Provider Note   CSN: [id number] Arrival date & time: 04/23/21  1533     History Chief Complaint  Patient presents with   Fall   Fatigue    [name] is a 87 y.o. female.  HPI  87 year old female with past medical history of dementia, anemia, CVA with left-sided deficits, atrial fibrillation, DVT/PE anticoagulant on Xarelto, CKD, remote colon cancer status postresection in remission presents emergency department with fall and head injury.  Patient reportedly had a fall yesterday where she hit the left side of her face.  No reported LOC.  Today nursing staff noticed that the patient seemed more lethargic when compared to yesterday.  Patient herself does not have any complaints at this time.  She is oriented to herself but not location.  Poor historian.  No staff members at bedside.  Past Medical History:  Diagnosis Date   Acute encephalopathy    Acute renal failure superimposed on stage 3 chronic kidney disease (HCC)    Anemia    Atrial fibrillation ([place])    Colon cancer ([place]) 12/11   Colon cancer ([place]) 2011   Diarrhea    DVT (deep venous thrombosis) ([place]) 11/06   Elevated brain natriuretic peptide (BNP) level    Frequent headaches    Heart murmur    Hyperkalemia    Hypertension    Pulmonary embolism ([place]) 11/06   Seizures ([place])    Stroke ([place]) 11/30/2014   Urine retention 01/2019    Patient Active Problem List   Diagnosis Date Noted   CVA (cerebral vascular accident) ([place]) 05/28/2020   Left-sided weakness 05/27/2020   Constipation 05/27/2020   DNR (do not resuscitate) 05/27/2020   MCI (mild cognitive impairment) 03/07/2019   Congestive heart failure (CHF) ([place]) 01/20/2019   CAD (coronary artery disease) 06/07/2018   Pulmonary hypertension, primary ([place]) 06/07/2018   Generalized weakness    Abnormal LFTs 06/01/2018   Hallucination 06/01/2018   Edema of right lower extremity 09/28/2015   Asthma 08/14/2015    PCP NOTES >>>>>>>>>>>>>>>>>>>>>>>>>>>>>> 04/24/2015   TIA (transient ischemic attack) 12/11/2014   Dyslipidemia 12/11/2014   History of CVA (cerebrovascular accident) 12/01/2014   Thrombocytopenia ([place]) 12/01/2014   Chronic renal insufficiency, stage II (mild) 11/12/2013   Impingement syndrome, shoulder, left 11/09/2013   Acquired short bowel syndrome 11/09/2013   Hx pulmonary embolism 06/27/2013   Warfarin anticoagulation 06/27/2013   DDD (degenerative disc disease), lumbar 11/18/2012   ATRIAL FIBRILLATION   07/08/2010   COLON CANCER, HX OF 07/08/2010   DIZZINESS 10/18/2009   Essential hypertension 12/16/2007   HIP PAIN, CHRONIC 12/28/2006   ANEMIA-NOS 07/15/2006   Personal history of venous thrombosis and embolism 07/15/2006    Past Surgical History:  Procedure Laterality Date   APPENDECTOMY  1952   COLECTOMY  06/2010   partial, Dr. Donne Hazel complicated by LLE CVT; S/P IVC umbrella & anemia   HIP FRACTURE SURGERY  2006   Trauma   TOTAL ABDOMINAL HYSTERECTOMY W/ BILATERAL SALPINGOOPHORECTOMY  1973   For Fibroids   TOTAL HIP ARTHROPLASTY  01/03/07   Left hip replacement.   VENA CAVA FILTER PLACEMENT  06/28/2010     OB History   No obstetric history on file.     Family History  Problem Relation Age of Onset   Peripheral vascular disease Father    Heart attack Mother 71   Heart disease Mother        MI   Coronary artery disease Sister  Diabetes Paternal Grandmother    Coronary artery disease Maternal Grandfather     Social History   Tobacco Use   Smoking status: Passive Smoke Exposure - Never Smoker   Smokeless tobacco: Never   Tobacco comments:    husband smoked in home.   Vaping Use   Vaping Use: Never used  Substance Use Topics   Alcohol use: No    Alcohol/week: 0.0 standard drinks   Drug use: No    Home Medications Prior to Admission medications   Medication Sig Start Date End Date Taking? Authorizing Provider  acetaminophen (TYLENOL) 500 MG  tablet Take 1,000 mg by mouth every 8 (eight) hours as needed for headache (pain).     [provider]  albuterol (ACCUNEB) 1.25 MG/3ML nebulizer solution Take 2.5 mg by nebulization every 6 (six) hours as needed for wheezing or shortness of breath.     [provider]  albuterol (VENTOLIN HFA) 108 (90 Base) MCG/ACT inhaler Inhale 2 puffs into the lungs every 6 (six) hours as needed for wheezing or shortness of breath.    [provider]  aspirin 81 MG chewable tablet Chew 81 mg by mouth daily.    [provider]  atorvastatin (LIPITOR) 20 MG tablet Take 1 tablet (20 mg total) by mouth daily. 07/04/19   Colon Branch, MD  bisacodyl (DULCOLAX) 10 MG suppository Place 10 mg rectally as needed for moderate constipation.    [provider]  Calcium Carbonate-Vitamin D (OYSTER SHELL/VITAMIN D PO) Take 1 tablet by mouth daily. With breakfast    [provider]  Ensure (ENSURE) Take 237 mLs by mouth in the morning, at noon, and at bedtime.    [provider]  escitalopram (LEXAPRO) 10 MG tablet Take 10 mg by mouth daily.    [provider]  guaiFENesin (MUCINEX) 600 MG 12 hr tablet Take 600 mg by mouth 2 (two) times daily.  03/21/20   [provider]  hydrocortisone (ANUSOL-HC) 2.5 % rectal cream Place 1 application rectally 2 (two) times daily as needed for hemorrhoids or anal itching.    [provider]  ipratropium-albuterol (DUONEB) 0.5-2.5 (3) MG/3ML SOLN Take 3 mLs by nebulization every 6 (six) hours. For coughing, wheezing, and shortness of breath.    [provider]  isosorbide mononitrate (IMDUR) 30 MG 24 hr tablet Take 15 mg by mouth daily.    [provider]  loratadine (CLARITIN) 10 MG tablet Take 10 mg by mouth daily.    [provider]  Metoprolol Tartrate 37.5 MG TABS Take 1 tablet by mouth 2 (two) times daily.    [provider]  mometasone-formoterol (DULERA) 100-5  MCG/ACT AERO Inhale 2 puffs into the lungs 2 (two) times daily. Gargle and rinse your mouth with water after each use of this medication to help prevent dryness,    [provider]  Multiple Vitamin (MULTIVITAMIN WITH MINERALS) TABS tablet Take 1 tablet by mouth daily. 06/06/18   Mercy Riding, MD  NON FORMULARY Diet :Regular Lennie Hummer liquids diet .Continue Liberalized diet per DietaryNo therapeutic restrictions 01/19/20   [provider]  Nutritional Supplements (NUTRITIONAL SUPPLEMENT PO) Take 1 each by mouth. Magic Cup with L/D meal    [provider]  polyethylene glycol (MIRALAX / GLYCOLAX) packet Take 17 g by mouth daily as needed. 07/21/18   [name], MD  promethazine (PHENERGAN) 12.5 MG tablet Take 12.5 mg by mouth 3 (three) times daily as needed for nausea or  vomiting.    [provider]  QUEtiapine (SEROQUEL) 50 MG tablet Take 25 mg by mouth at bedtime.    [provider]  tamsulosin (FLOMAX) 0.4 MG CAPS capsule Take 1 capsule (0.4 mg total) by mouth daily. 03/20/19   Colon Branch, MD  verapamil (VERELAN PM) 120 MG 24 hr capsule Take 120 mg by mouth at bedtime.    [provider]  warfarin (COUMADIN) 2 MG tablet Take 2 mg by mouth daily. On Tues, Wed, Thurs, Sat, Sun    [provider]  warfarin (COUMADIN) 4 MG tablet Take 4 mg by mouth daily. On Mondays, and Fridays    [provider]    Allergies    Patient has no known allergies.  Review of Systems   Review of Systems  Unable to perform ROS: Dementia   Physical Exam Updated Vital Signs BP (!) 126/93   Pulse 72   Temp (P) 99.4 F (37.4 C) (Oral)   Resp 19   Ht 5\' 3"  (1.6 m)   Wt 61.7 kg   SpO2 100%   BMI 24.10 kg/m   Physical Exam Vitals and nursing note reviewed.  Constitutional:      Appearance: Normal appearance.  HENT:     Head: Normocephalic.     Comments: Left-sided cheek and forehead as well as nasal bridge bruising    Mouth/Throat:      Mouth: Mucous membranes are moist.  Eyes:     Pupils: Pupils are equal, round, and reactive to light.  Cardiovascular:     Rate and Rhythm: Normal rate.  Pulmonary:     Effort: Pulmonary effort is normal. No respiratory distress.  Abdominal:     Palpations: Abdomen is soft.     Tenderness: There is no abdominal tenderness.  Musculoskeletal:        General: No deformity.     Cervical back: No rigidity or tenderness.  Skin:    General: Skin is warm.     Findings: Bruising present.  Neurological:     Mental Status: She is alert.     Comments: Oriented to self which is mentioned to be her baseline, weak left side   Psychiatric:        Mood and Affect: Mood normal.    ED Results / Procedures / Treatments   Labs (all labs ordered are listed, but only abnormal results are displayed) Labs Reviewed  CBC WITH DIFFERENTIAL/PLATELET  COMPREHENSIVE METABOLIC PANEL  URINALYSIS, ROUTINE W REFLEX MICROSCOPIC    EKG EKG Interpretation  Date/Time:  Wednesday April 23 2021 15:42:42 EDT Ventricular Rate:  62 PR Interval:    QRS Duration: 115 QT Interval:  495 QTC Calculation: 503 R Axis:   -56 Text Interpretation: Atrial fibrillation LVH with IVCD, LAD and secondary repol abnrm Prolonged QT interval Afib, similar to previous Confirmed by [place] Atlas [phone]) on 04/23/2021 3:53:22 PM  Radiology No results found.  Procedures Procedures   Medications Ordered in ED Medications - No data to display  ED Course  I have reviewed the triage vital signs and the nursing notes.  Pertinent labs & imaging results that were available during my care of the patient were reviewed by me and considered in my medical decision making (see chart for details).    MDM Rules/Calculators/A&P                           87 year old female presents emergency department for evaluation of  mechanical fall yesterday.  Patient is on anticoagulation.  Concern for increased sleepiness and altered mental  status today.  Trauma imaging including CT of the head, face, neck as well as x-ray imaging showed no acute finding.  Blood work appears baseline, urinalysis is positive for urinary tract infection.  Most likely the source of her sleepiness and change in mental status.  Patient's daughter [name] has been updated.  Patient will be placed on oral antibiotics and discharged back to her facility.  Urine culture has been sent.  Patient at this time appears safe and stable for discharge and will be treated as an outpatient.  Discharge plan and strict return to ED precautions discussed, patient verbalizes understanding and agreement.  Final Clinical Impression(s) / ED Diagnoses Final diagnoses:  None    Rx / DC Orders ED Discharge Orders     None        Lorelle Gibbs, DO 04/23/21 2314

## 2021-04-23 NOTE — Discharge Instructions (Signed)
You have been seen and discharged from the emergency department.  Your x-ray and CT imaging from the fall were normal.  Your work-up showed a urinary tract infection.  Take antibiotic as directed.  Follow-up with your primary provider for reevaluation and further care. Take home medications as prescribed. If you have any worsening symptoms or further concerns for your health please return to an emergency department for further evaluation.

## 2021-04-23 NOTE — ED Triage Notes (Addendum)
Pt arrives via EMS from [facility] facility for increased lethargy after a fall yesterday. Pt reportedly fell yesterday and hot the left side of her face. No LOC per staff. Pt takes xarelto. Dementia at baseline. Alert to self. Staff states that she normally is able to feed herself but has been very lethargic today. Dressing in place over left face. Hx of CVA left sided deficits.

## 2021-04-23 NOTE — ED Notes (Signed)
Soft mits placed on patient after repeatedly removing EKG/pulse cables.

## 2021-04-23 NOTE — ED Notes (Signed)
[facility] called 5th on list

## 2021-04-24 DIAGNOSIS — R456 Violent behavior: Secondary | ICD-10-CM | POA: Diagnosis not present

## 2021-04-24 DIAGNOSIS — Z7401 Bed confinement status: Secondary | ICD-10-CM | POA: Diagnosis not present

## 2021-04-29 DIAGNOSIS — R1312 Dysphagia, oropharyngeal phase: Secondary | ICD-10-CM | POA: Diagnosis not present

## 2021-05-01 DIAGNOSIS — R1312 Dysphagia, oropharyngeal phase: Secondary | ICD-10-CM | POA: Diagnosis not present

## 2021-05-03 DIAGNOSIS — R1312 Dysphagia, oropharyngeal phase: Secondary | ICD-10-CM | POA: Diagnosis not present

## 2021-05-06 DIAGNOSIS — R1312 Dysphagia, oropharyngeal phase: Secondary | ICD-10-CM | POA: Diagnosis not present

## 2021-05-10 DIAGNOSIS — R1312 Dysphagia, oropharyngeal phase: Secondary | ICD-10-CM | POA: Diagnosis not present

## 2021-05-12 DIAGNOSIS — R1312 Dysphagia, oropharyngeal phase: Secondary | ICD-10-CM | POA: Diagnosis not present

## 2021-05-13 DIAGNOSIS — R1312 Dysphagia, oropharyngeal phase: Secondary | ICD-10-CM | POA: Diagnosis not present

## 2021-05-14 DIAGNOSIS — R1312 Dysphagia, oropharyngeal phase: Secondary | ICD-10-CM | POA: Diagnosis not present

## 2021-05-15 DIAGNOSIS — R441 Visual hallucinations: Secondary | ICD-10-CM | POA: Diagnosis not present

## 2021-05-15 DIAGNOSIS — F339 Major depressive disorder, recurrent, unspecified: Secondary | ICD-10-CM | POA: Diagnosis not present

## 2021-05-15 DIAGNOSIS — F01B2 Vascular dementia, moderate, with psychotic disturbance: Secondary | ICD-10-CM | POA: Diagnosis not present

## 2021-05-17 DIAGNOSIS — R1312 Dysphagia, oropharyngeal phase: Secondary | ICD-10-CM | POA: Diagnosis not present

## 2021-05-19 DIAGNOSIS — R1312 Dysphagia, oropharyngeal phase: Secondary | ICD-10-CM | POA: Diagnosis not present

## 2021-05-19 DIAGNOSIS — E785 Hyperlipidemia, unspecified: Secondary | ICD-10-CM | POA: Diagnosis not present

## 2021-05-19 DIAGNOSIS — J449 Chronic obstructive pulmonary disease, unspecified: Secondary | ICD-10-CM | POA: Diagnosis not present

## 2021-05-19 DIAGNOSIS — D6869 Other thrombophilia: Secondary | ICD-10-CM | POA: Diagnosis not present

## 2021-05-19 DIAGNOSIS — F03918 Unspecified dementia, unspecified severity, with other behavioral disturbance: Secondary | ICD-10-CM | POA: Diagnosis not present

## 2021-05-20 DIAGNOSIS — R1312 Dysphagia, oropharyngeal phase: Secondary | ICD-10-CM | POA: Diagnosis not present

## 2021-05-21 DIAGNOSIS — Z23 Encounter for immunization: Secondary | ICD-10-CM | POA: Diagnosis not present

## 2021-05-22 DIAGNOSIS — F01B2 Vascular dementia, moderate, with psychotic disturbance: Secondary | ICD-10-CM | POA: Diagnosis not present

## 2021-05-22 DIAGNOSIS — R1312 Dysphagia, oropharyngeal phase: Secondary | ICD-10-CM | POA: Diagnosis not present

## 2021-05-22 DIAGNOSIS — R441 Visual hallucinations: Secondary | ICD-10-CM | POA: Diagnosis not present

## 2021-05-22 DIAGNOSIS — F339 Major depressive disorder, recurrent, unspecified: Secondary | ICD-10-CM | POA: Diagnosis not present

## 2021-05-25 DIAGNOSIS — R1312 Dysphagia, oropharyngeal phase: Secondary | ICD-10-CM | POA: Diagnosis not present

## 2021-05-27 DIAGNOSIS — R1312 Dysphagia, oropharyngeal phase: Secondary | ICD-10-CM | POA: Diagnosis not present

## 2021-05-29 DIAGNOSIS — R1312 Dysphagia, oropharyngeal phase: Secondary | ICD-10-CM | POA: Diagnosis not present

## 2021-05-31 DIAGNOSIS — R1312 Dysphagia, oropharyngeal phase: Secondary | ICD-10-CM | POA: Diagnosis not present

## 2021-06-03 DIAGNOSIS — F01B2 Vascular dementia, moderate, with psychotic disturbance: Secondary | ICD-10-CM | POA: Diagnosis not present

## 2021-06-03 DIAGNOSIS — R441 Visual hallucinations: Secondary | ICD-10-CM | POA: Diagnosis not present

## 2021-06-03 DIAGNOSIS — F339 Major depressive disorder, recurrent, unspecified: Secondary | ICD-10-CM | POA: Diagnosis not present

## 2021-06-08 DIAGNOSIS — F339 Major depressive disorder, recurrent, unspecified: Secondary | ICD-10-CM | POA: Diagnosis not present

## 2021-06-08 DIAGNOSIS — F01B2 Vascular dementia, moderate, with psychotic disturbance: Secondary | ICD-10-CM | POA: Diagnosis not present

## 2021-06-08 DIAGNOSIS — R441 Visual hallucinations: Secondary | ICD-10-CM | POA: Diagnosis not present

## 2021-06-10 DIAGNOSIS — R441 Visual hallucinations: Secondary | ICD-10-CM | POA: Diagnosis not present

## 2021-06-10 DIAGNOSIS — F339 Major depressive disorder, recurrent, unspecified: Secondary | ICD-10-CM | POA: Diagnosis not present

## 2021-06-10 DIAGNOSIS — F01B2 Vascular dementia, moderate, with psychotic disturbance: Secondary | ICD-10-CM | POA: Diagnosis not present

## 2021-06-26 DIAGNOSIS — F01B2 Vascular dementia, moderate, with psychotic disturbance: Secondary | ICD-10-CM | POA: Diagnosis not present

## 2021-06-26 DIAGNOSIS — F339 Major depressive disorder, recurrent, unspecified: Secondary | ICD-10-CM | POA: Diagnosis not present

## 2021-06-26 DIAGNOSIS — R441 Visual hallucinations: Secondary | ICD-10-CM | POA: Diagnosis not present

## 2021-07-02 DIAGNOSIS — F339 Major depressive disorder, recurrent, unspecified: Secondary | ICD-10-CM | POA: Diagnosis not present

## 2021-07-02 DIAGNOSIS — R441 Visual hallucinations: Secondary | ICD-10-CM | POA: Diagnosis not present

## 2021-07-02 DIAGNOSIS — F01B2 Vascular dementia, moderate, with psychotic disturbance: Secondary | ICD-10-CM | POA: Diagnosis not present

## 2021-07-03 DIAGNOSIS — D696 Thrombocytopenia, unspecified: Secondary | ICD-10-CM | POA: Diagnosis not present

## 2021-07-03 DIAGNOSIS — F330 Major depressive disorder, recurrent, mild: Secondary | ICD-10-CM | POA: Diagnosis not present

## 2021-07-03 DIAGNOSIS — N1831 Chronic kidney disease, stage 3a: Secondary | ICD-10-CM | POA: Diagnosis not present

## 2021-07-03 DIAGNOSIS — I482 Chronic atrial fibrillation, unspecified: Secondary | ICD-10-CM | POA: Diagnosis not present

## 2021-09-17 ENCOUNTER — Emergency Department (HOSPITAL_COMMUNITY)
Admission: EM | Admit: 2021-09-17 | Discharge: 2021-09-18 | Disposition: A | Discharge status: Home / Self Care | Payer: Medicare Other | Attending: Emergency Medicine | Admitting: Emergency Medicine

## 2021-09-17 ENCOUNTER — Encounter (HOSPITAL_COMMUNITY): Payer: Self-pay

## 2021-09-17 ENCOUNTER — Other Ambulatory Visit: Payer: Self-pay

## 2021-09-17 ENCOUNTER — Emergency Department (HOSPITAL_COMMUNITY): Payer: Medicare Other

## 2021-09-17 DIAGNOSIS — Z79899 Other long term (current) drug therapy: Secondary | ICD-10-CM | POA: Insufficient documentation

## 2021-09-17 DIAGNOSIS — N183 Chronic kidney disease, stage 3 unspecified: Secondary | ICD-10-CM | POA: Insufficient documentation

## 2021-09-17 DIAGNOSIS — S60222A Contusion of left hand, initial encounter: Secondary | ICD-10-CM | POA: Insufficient documentation

## 2021-09-17 DIAGNOSIS — I4891 Unspecified atrial fibrillation: Secondary | ICD-10-CM | POA: Insufficient documentation

## 2021-09-17 DIAGNOSIS — X58XXXA Exposure to other specified factors, initial encounter: Secondary | ICD-10-CM | POA: Insufficient documentation

## 2021-09-17 DIAGNOSIS — I129 Hypertensive chronic kidney disease with stage 1 through stage 4 chronic kidney disease, or unspecified chronic kidney disease: Secondary | ICD-10-CM | POA: Insufficient documentation

## 2021-09-17 DIAGNOSIS — D696 Thrombocytopenia, unspecified: Secondary | ICD-10-CM | POA: Diagnosis not present

## 2021-09-17 DIAGNOSIS — F0390 Unspecified dementia without behavioral disturbance: Secondary | ICD-10-CM | POA: Diagnosis not present

## 2021-09-17 DIAGNOSIS — S60221A Contusion of right hand, initial encounter: Secondary | ICD-10-CM | POA: Insufficient documentation

## 2021-09-17 DIAGNOSIS — Z20822 Contact with and (suspected) exposure to covid-19: Secondary | ICD-10-CM | POA: Insufficient documentation

## 2021-09-17 DIAGNOSIS — S6991XA Unspecified injury of right wrist, hand and finger(s), initial encounter: Secondary | ICD-10-CM | POA: Diagnosis present

## 2021-09-17 DIAGNOSIS — Z7982 Long term (current) use of aspirin: Secondary | ICD-10-CM | POA: Insufficient documentation

## 2021-09-17 LAB — CBC WITH DIFFERENTIAL/PLATELET
Abs Immature Granulocytes: 0.02 10*3/uL (ref 0.00–0.07)
Basophils Absolute: 0 10*3/uL (ref 0.0–0.1)
Basophils Relative: 0 %
Eosinophils Absolute: 0.1 10*3/uL (ref 0.0–0.5)
Eosinophils Relative: 3 %
HCT: 37.1 % (ref 36.0–46.0)
Hemoglobin: 11.5 g/dL — ABNORMAL LOW (ref 12.0–15.0)
Immature Granulocytes: 1 %
Lymphocytes Relative: 21 %
Lymphs Abs: 0.8 10*3/uL (ref 0.7–4.0)
MCH: 30.7 pg (ref 26.0–34.0)
MCHC: 31 g/dL (ref 30.0–36.0)
MCV: 99.2 fL (ref 80.0–100.0)
Monocytes Absolute: 0.4 10*3/uL (ref 0.1–1.0)
Monocytes Relative: 10 %
Neutro Abs: 2.3 10*3/uL (ref 1.7–7.7)
Neutrophils Relative %: 65 %
Platelets: 45 10*3/uL — ABNORMAL LOW (ref 150–400)
RBC: 3.74 MIL/uL — ABNORMAL LOW (ref 3.87–5.11)
RDW: 15.3 % (ref 11.5–15.5)
WBC: 3.6 10*3/uL — ABNORMAL LOW (ref 4.0–10.5)
nRBC: 0 % (ref 0.0–0.2)

## 2021-09-17 LAB — BASIC METABOLIC PANEL
Anion gap: 11 (ref 5–15)
BUN: 15 mg/dL (ref 8–23)
CO2: 21 mmol/L — ABNORMAL LOW (ref 22–32)
Calcium: 8.3 mg/dL — ABNORMAL LOW (ref 8.9–10.3)
Chloride: 109 mmol/L (ref 98–111)
Creatinine, Ser: 0.78 mg/dL (ref 0.44–1.00)
GFR, Estimated: >60 mL/min (ref >60)
Glucose, Bld: 123 mg/dL — ABNORMAL HIGH (ref 70–99)
Potassium: 3.6 mmol/L (ref 3.5–5.1)
Sodium: 141 mmol/L (ref 135–145)

## 2021-09-17 LAB — HEPATIC FUNCTION PANEL
ALT: 11 U/L (ref 0–44)
AST: 17 U/L (ref 15–41)
Albumin: 2.7 g/dL — ABNORMAL LOW (ref 3.5–5.0)
Alkaline Phosphatase: 44 U/L (ref 38–126)
Bilirubin, Direct: 0.1 mg/dL (ref 0.0–0.2)
Indirect Bilirubin: 0.4 mg/dL (ref 0.3–0.9)
Total Bilirubin: 0.5 mg/dL (ref 0.3–1.2)
Total Protein: 5.5 g/dL — ABNORMAL LOW (ref 6.5–8.1)

## 2021-09-17 LAB — RESP PANEL BY RT-PCR (FLU A&B, COVID) ARPGX2
Influenza A by PCR: NEGATIVE
Influenza B by PCR: NEGATIVE
SARS Coronavirus 2 by RT PCR: NEGATIVE

## 2021-09-17 LAB — PROTIME-INR
INR: 1.4 — ABNORMAL HIGH (ref 0.8–1.2)
Prothrombin Time: 17.2 seconds — ABNORMAL HIGH (ref 11.4–15.2)

## 2021-09-17 NOTE — ED Notes (Signed)
Pt care taken, pt alert no complaints at this time. ?

## 2021-09-17 NOTE — ED Triage Notes (Signed)
Pt BIB [facility] from [facility] d/t abnormal labs-  platelets 45. No other complaints at this time. Alert but disoriented. Pt on Xarelto.  ? ?BP 132/78 ?Pulse 101  ?RR 13 ?98% RA ?

## 2021-09-17 NOTE — ED Provider Notes (Signed)
?[facility] ?Provider Note ? ? ?CSN: [id number] ?Arrival date & time: 09/17/21  1909 ? ?  ? ?History ? ?Chief Complaint  ?Patient presents with  ? abnormal labs  ? ? ?[name] is a 88 y.o. female with medical history of colon cancer, dementia, DVT, PE, A-fib, anemia, hypertension, CKD stage III.  Patient presents to ED due to abnormal labs.  Per EMS, patient sent here for evaluation due to low platelets.  Patient platelet level is currently 45 per EMS.  Patient denies any other complaints at this time.  Patient is alert but disoriented, has history of dementia.  Patient has no complaints at this time.  Patient denies any headache, lightheadedness, dizziness, weakness, chest pain, shortness of breath, nausea, vomiting, fevers, body aches, chills. ? ?HPI ? ?  ? ?Home Medications ?Prior to Admission medications   ?Medication Sig Start Date End Date Taking? Authorizing Provider  ?acetaminophen (TYLENOL) 500 MG tablet Take 1,000 mg by mouth every 8 (eight) hours as needed for headache (or pain).    [provider]  ?albuterol (PROVENTIL) (2.5 MG/3ML) 0.083% nebulizer solution Take 2.5 mg by nebulization every 6 (six) hours as needed for wheezing or shortness of breath.    [provider]  ?albuterol (VENTOLIN HFA) 108 (90 Base) MCG/ACT inhaler Inhale 2 puffs into the lungs every 6 (six) hours as needed for wheezing or shortness of breath.    [provider]  ?aspirin 81 MG chewable tablet Chew 81 mg by mouth daily.    [provider]  ?atorvastatin (LIPITOR) 20 MG tablet Take 1 tablet (20 mg total) by mouth daily. ?Patient taking differently: Take 20 mg by mouth at bedtime. 07/04/19   Colon Branch, MD  ?bisacodyl (DULCOLAX) 10 MG suppository Place 10 mg rectally daily as needed (for constipation not relieved by Milk of Magnesia).    [provider]  ?Calcium-Vitamin D (OYSTER SHELL CALCIUM/VIT D3) 500-5 MG-MCG TABS Take 1 tablet by  mouth daily with breakfast.    [provider]  ?cephALEXin (KEFLEX) 500 MG capsule Take 1 capsule (500 mg total) by mouth 4 (four) times daily. 04/23/21   Horton, Alvin Critchley, DO  ?Ensure (ENSURE) Take 237 mLs by mouth in the morning, at noon, and at bedtime.    [provider]  ?escitalopram (LEXAPRO) 10 MG tablet Take 10 mg by mouth daily.    [provider]  ?guaiFENesin (MUCINEX) 600 MG 12 hr tablet Take 600 mg by mouth in the morning and at bedtime. 03/21/20   [provider]  ?hydrocortisone (ANUSOL-HC) 2.5 % rectal cream Place 1 application rectally 2 (two) times daily as needed for hemorrhoids or anal itching.    [provider]  ?ipratropium-albuterol (DUONEB) 0.5-2.5 (3) MG/3ML SOLN Take 3 mLs by nebulization every 6 (six) hours as needed (for coughing, wheezing or shortness of breath).    [provider]  ?isosorbide mononitrate (IMDUR) 30 MG 24 hr tablet Take 15 mg by mouth in the morning.    [provider]  ?loratadine (CLARITIN) 10 MG tablet Take 10 mg by mouth daily.    [provider]  ?magnesium hydroxide (MILK OF MAGNESIA) 400 MG/5ML suspension Take 30 mLs by mouth daily as needed for mild constipation (and do not use for residents with renal failure CFR <30; will need to contact MD for orders).    [provider]  ?metoprolol tartrate (LOPRESSOR) 25 MG tablet Take 37.5 mg by mouth in  the morning and at bedtime.    [provider]  ?mometasone-formoterol (DULERA) 100-5 MCG/ACT AERO Inhale 2 puffs into the lungs See admin instructions. Inhale 2 puffs into the lungs at 8 AM and 8 PM- gargle and rinse mouth with water after each use of this medication to help prevent dryness    [provider]  ?Multiple Vitamin (MULTIVITAMIN WITH MINERALS) TABS tablet Take 1 tablet by mouth daily. 06/06/18   Mercy Riding, MD  ?NON FORMULARY Diet :Regular Lennie Hummer liquids diet .Continue Liberalized diet per DietaryNo  therapeutic restrictions 01/19/20   [provider]  ?Nutritional Supplements (NUTRITIONAL SUPPLEMENT PO) Take 120 mLs by mouth See admin instructions. Magic Cup desert- Eat 120 ml's by mouth with lunch and evening meal    [provider]  ?polyethylene glycol (MIRALAX / GLYCOLAX) packet Take 17 g by mouth daily as needed. ?Patient taking differently: Take 17 g by mouth daily as needed for mild constipation (to be mixed into 4 ounces of water). 07/21/18   [name], MD  ?promethazine (PHENERGAN) 12.5 MG tablet Take 12.5 mg by mouth 3 (three) times daily as needed for nausea or vomiting.    [provider]  ?QUEtiapine (SEROQUEL) 50 MG tablet Take 50 mg by mouth at bedtime.    [provider]  ?Sodium Phosphates (RA SALINE ENEMA RE) Place 1 enema rectally daily as needed (for constipation not relieved by bisacodyl suppository and call MD if no relief after enema).    [provider]  ?tamsulosin (FLOMAX) 0.4 MG CAPS capsule Take 1 capsule (0.4 mg total) by mouth daily. 03/20/19   Colon Branch, MD  ?verapamil (VERELAN PM) 120 MG 24 hr capsule Take 120 mg by mouth at bedtime.    [provider]  ?XARELTO 15 MG TABS tablet Take 15 mg by mouth daily at 6 PM.    [provider]  ?   ? ?Allergies    ?Patient has no known allergies.   ? ?Review of Systems   ?Review of Systems  ?Constitutional:  Negative for chills and fever.  ?Respiratory:  Negative for shortness of breath.   ?Cardiovascular:  Negative for chest pain.  ?Gastrointestinal:  Negative for abdominal pain, nausea and vomiting.  ?Neurological:  Negative for dizziness, weakness, light-headedness and headaches.  ?All other systems reviewed and are negative. ? ?Physical Exam ?Updated Vital Signs ?BP (!) 147/71   Pulse 85   Temp 98.7 ?F (37.1 ?C) (Oral)   Resp (!) 22   SpO2 99%  ?Physical Exam ?Vitals and nursing note reviewed.  ?Constitutional:   ?   General: She is not in acute distress. ?    Appearance: She is not ill-appearing, toxic-appearing or diaphoretic.  ?HENT:  ?   Head: Normocephalic.  ?   Nose: Nose normal. No congestion.  ?   Mouth/Throat:  ?   Mouth: Mucous membranes are dry.  ?Eyes:  ?   Extraocular Movements: Extraocular movements intact.  ?   Conjunctiva/sclera: Conjunctivae normal.  ?   Pupils: Pupils are equal, round, and reactive to light.  ?Cardiovascular:  ?   Rate and Rhythm: Normal rate and regular rhythm.  ?Pulmonary:  ?   Effort: Pulmonary effort is normal.  ?   Breath sounds: Normal breath sounds. No stridor. No wheezing, rhonchi or rales.  ?Abdominal:  ?   General: Abdomen is flat. Bowel sounds are normal.  ?   Palpations: Abdomen is soft.  ?   Tenderness: There is  no abdominal tenderness.  ?Musculoskeletal:  ?   Cervical back: Normal range of motion and neck supple. No tenderness.  ?Skin: ?   General: Skin is warm and dry.  ?   Capillary Refill: Capillary refill takes less than 2 seconds.  ?   Findings: Bruising present.  ?   Comments: Significant bruising noted to the patient's bilateral upper extremities  ?Neurological:  ?   Mental Status: She is alert. Mental status is at baseline.  ? ? ?ED Results / Procedures / Treatments   ?Labs ?(all labs ordered are listed, but only abnormal results are displayed) ?Labs Reviewed  ?CBC WITH DIFFERENTIAL/PLATELET - Abnormal; Notable for the following components:  ?    Result Value  ? WBC 3.6 (*)   ? RBC 3.74 (*)   ? Hemoglobin 11.5 (*)   ? Platelets 45 (*)   ? All other components within normal limits  ?BASIC METABOLIC PANEL - Abnormal; Notable for the following components:  ? CO2 21 (*)   ? Glucose, Bld 123 (*)   ? Calcium 8.3 (*)   ? All other components within normal limits  ?HEPATIC FUNCTION PANEL - Abnormal; Notable for the following components:  ? Total Protein 5.5 (*)   ? Albumin 2.7 (*)   ? All other components within normal limits  ?PROTIME-INR - Abnormal; Notable for the following components:  ? Prothrombin Time 17.2 (*)   ?  INR 1.4 (*)   ? All other components within normal limits  ?RESP PANEL BY RT-PCR (FLU A&B, COVID) ARPGX2  ? ? ?EKG ?None ? ?Radiology ?CT Head Wo Contrast ? ?Result Date: 09/17/2021 ?CLINICAL DATA:  Thrombocytopenia EXAM:

## 2021-09-17 NOTE — Discharge Instructions (Addendum)
In the event that you suffer any sort of trauma such as head trauma, falls please report immediately to the ED.  Due to the low platelet counts that you have you will need immediate imaging. ?Patient case was discussed with Dr. Julien Nordmann of hematology who does not believe this patient requires admission. Dr. Julien Nordmann would like to see the patient in his office this week. Please call and make an appointment with him. His contact information is in this discharge packet.  ?Please call Dr. Earlie Server tomorrow and make an appointment to be seen by him.  I have included his contact information in this discharge packet. ?Please read the attached informational guide concerning low platelet counts ?

## 2022-05-02 ENCOUNTER — Emergency Department (HOSPITAL_COMMUNITY)
Admission: EM | Admit: 2022-05-02 | Discharge: 2022-05-03 | Disposition: A | Discharge status: Skilled Nursing Facility | Payer: Medicare Other | Attending: Emergency Medicine | Admitting: Emergency Medicine

## 2022-05-02 ENCOUNTER — Emergency Department (HOSPITAL_BASED_OUTPATIENT_CLINIC_OR_DEPARTMENT_OTHER): Payer: Medicare Other

## 2022-05-02 ENCOUNTER — Emergency Department (HOSPITAL_COMMUNITY): Payer: Medicare Other

## 2022-05-02 ENCOUNTER — Other Ambulatory Visit: Payer: Self-pay

## 2022-05-02 ENCOUNTER — Encounter (HOSPITAL_COMMUNITY): Payer: Self-pay

## 2022-05-02 DIAGNOSIS — N189 Chronic kidney disease, unspecified: Secondary | ICD-10-CM | POA: Diagnosis not present

## 2022-05-02 DIAGNOSIS — D696 Thrombocytopenia, unspecified: Secondary | ICD-10-CM | POA: Insufficient documentation

## 2022-05-02 DIAGNOSIS — M79609 Pain in unspecified limb: Secondary | ICD-10-CM | POA: Diagnosis not present

## 2022-05-02 DIAGNOSIS — L03116 Cellulitis of left lower limb: Secondary | ICD-10-CM | POA: Insufficient documentation

## 2022-05-02 DIAGNOSIS — R233 Spontaneous ecchymoses: Secondary | ICD-10-CM

## 2022-05-02 DIAGNOSIS — Z7901 Long term (current) use of anticoagulants: Secondary | ICD-10-CM | POA: Diagnosis not present

## 2022-05-02 DIAGNOSIS — L819 Disorder of pigmentation, unspecified: Secondary | ICD-10-CM | POA: Diagnosis not present

## 2022-05-02 DIAGNOSIS — M79605 Pain in left leg: Secondary | ICD-10-CM | POA: Diagnosis present

## 2022-05-02 DIAGNOSIS — F0390 Unspecified dementia without behavioral disturbance: Secondary | ICD-10-CM | POA: Insufficient documentation

## 2022-05-02 DIAGNOSIS — Z79899 Other long term (current) drug therapy: Secondary | ICD-10-CM | POA: Diagnosis not present

## 2022-05-02 DIAGNOSIS — M7989 Other specified soft tissue disorders: Secondary | ICD-10-CM

## 2022-05-02 DIAGNOSIS — I4891 Unspecified atrial fibrillation: Secondary | ICD-10-CM | POA: Insufficient documentation

## 2022-05-02 LAB — CBC WITH DIFFERENTIAL/PLATELET
Abs Immature Granulocytes: 0.02 10*3/uL (ref 0.00–0.07)
Basophils Absolute: 0 10*3/uL (ref 0.0–0.1)
Basophils Relative: 1 %
Eosinophils Absolute: 0.2 10*3/uL (ref 0.0–0.5)
Eosinophils Relative: 4 %
HCT: 37.8 % (ref 36.0–46.0)
Hemoglobin: 11.9 g/dL — ABNORMAL LOW (ref 12.0–15.0)
Immature Granulocytes: 0 %
Lymphocytes Relative: 19 %
Lymphs Abs: 0.9 10*3/uL (ref 0.7–4.0)
MCH: 31.2 pg (ref 26.0–34.0)
MCHC: 31.5 g/dL (ref 30.0–36.0)
MCV: 99 fL (ref 80.0–100.0)
Monocytes Absolute: 0.5 10*3/uL (ref 0.1–1.0)
Monocytes Relative: 10 %
Neutro Abs: 3.2 10*3/uL (ref 1.7–7.7)
Neutrophils Relative %: 66 %
Platelets: 79 10*3/uL — ABNORMAL LOW (ref 150–400)
RBC: 3.82 MIL/uL — ABNORMAL LOW (ref 3.87–5.11)
RDW: 15 % (ref 11.5–15.5)
WBC: 4.8 10*3/uL (ref 4.0–10.5)
nRBC: 0 % (ref 0.0–0.2)

## 2022-05-02 LAB — COMPREHENSIVE METABOLIC PANEL
ALT: 10 U/L (ref 0–44)
AST: 18 U/L (ref 15–41)
Albumin: 3.1 g/dL — ABNORMAL LOW (ref 3.5–5.0)
Alkaline Phosphatase: 53 U/L (ref 38–126)
Anion gap: 7 (ref 5–15)
BUN: 19 mg/dL (ref 8–23)
CO2: 27 mmol/L (ref 22–32)
Calcium: 8.8 mg/dL — ABNORMAL LOW (ref 8.9–10.3)
Chloride: 111 mmol/L (ref 98–111)
Creatinine, Ser: 0.92 mg/dL (ref 0.44–1.00)
GFR, Estimated: 58 mL/min — ABNORMAL LOW (ref >60)
Glucose, Bld: 64 mg/dL — ABNORMAL LOW (ref 70–99)
Potassium: 4 mmol/L (ref 3.5–5.1)
Sodium: 145 mmol/L (ref 135–145)
Total Bilirubin: 0.8 mg/dL (ref 0.3–1.2)
Total Protein: 5.6 g/dL — ABNORMAL LOW (ref 6.5–8.1)

## 2022-05-02 LAB — PROTIME-INR
INR: 1.5 — ABNORMAL HIGH (ref 0.8–1.2)
Prothrombin Time: 17.9 seconds — ABNORMAL HIGH (ref 11.4–15.2)

## 2022-05-02 LAB — APTT: aPTT: 33 seconds (ref 24–36)

## 2022-05-02 MED ORDER — DEXTROSE 50 % IV SOLN
1.0000 | Freq: Once | INTRAVENOUS | Status: AC
  Administered 2022-05-02: 50 mL via INTRAVENOUS
  Filled 2022-05-02: qty 50

## 2022-05-02 MED ORDER — CEPHALEXIN 250 MG PO CAPS
500.0000 mg | ORAL_CAPSULE | Freq: Once | ORAL | Status: AC
  Administered 2022-05-03: 500 mg via ORAL
  Filled 2022-05-02: qty 2

## 2022-05-02 MED ORDER — CEPHALEXIN 500 MG PO CAPS: 500.0000 mg | ORAL_CAPSULE | Freq: Four times a day (QID) | ORAL | 0 refills | Status: DC

## 2022-05-02 MED ORDER — ACETAMINOPHEN 325 MG PO TABS
650.0000 mg | ORAL_TABLET | Freq: Once | ORAL | Status: AC
  Administered 2022-05-02: 650 mg via ORAL
  Filled 2022-05-02: qty 2

## 2022-05-02 NOTE — ED Provider Notes (Signed)
[facility] EMERGENCY DEPARTMENT Provider Note   CSN: [id number] Arrival date & time: 05/02/22  1244     History  Chief Complaint  Patient presents with   Gangrenous left foot    [name] is a 88 y.o. female.  HPI     88 year old female comes in with chief complaint of skin discoloration to her leg. Patient has history of DVT/PE and used to be on Xarelto, CKD, A-fib and thrombocytopenia.  Patient no longer is on anticoagulation.   I called [facility] for nursing home, there was no response.  2 attempts were made.  Patient complains of pain over her left leg.  She is bedbound and contracted.  She denies any trauma.  She has dementia.   Home Medications Prior to Admission medications   Medication Sig Start Date End Date Taking? Authorizing Provider  acetaminophen (TYLENOL) 500 MG tablet Take 1,000 mg by mouth every 8 (eight) hours as needed for headache (or pain).   Yes [provider]  albuterol (PROVENTIL) (2.5 MG/3ML) 0.083% nebulizer solution Take 2.5 mg by nebulization every 6 (six) hours as needed for wheezing or shortness of breath.   Yes [provider]  albuterol (VENTOLIN HFA) 108 (90 Base) MCG/ACT inhaler Inhale 2 puffs into the lungs every 6 (six) hours as needed for wheezing or shortness of breath.   Yes [provider]  atorvastatin (LIPITOR) 20 MG tablet Take 1 tablet (20 mg total) by mouth daily. Patient taking differently: Take 20 mg by mouth at bedtime. 07/04/19  Yes Paz, [name], MD  bisacodyl (DULCOLAX) 10 MG suppository Place 10 mg rectally daily as needed (for constipation not relieved by Milk of Magnesia).   Yes [provider]  Calcium-Vitamin D (OYSTER SHELL CALCIUM/VIT D3) 500-5 MG-MCG TABS Take 1 tablet by mouth daily with breakfast.   Yes [provider]  carboxymethylcellulose 1 % ophthalmic solution Place 2 drops into the left eye 4 (four) times daily as needed (dry eye).   Yes [provider]  cephALEXin (KEFLEX) 500 MG capsule Take 1 capsule (500 mg total) by mouth 4 (four) times daily. 05/02/22  Yes Arlanda Shiplett, MD  escitalopram (LEXAPRO) 5 MG tablet Take 5 mg by mouth daily.   Yes [provider]  guaiFENesin (MUCINEX) 600 MG 12 hr tablet Take 600 mg by mouth in the morning and at bedtime. 03/21/20  Yes [provider]  hydrocortisone (ANUSOL-HC) 2.5 % rectal cream Place 1 application rectally 2 (two) times daily as needed for hemorrhoids or anal itching.   Yes [provider]  ipratropium-albuterol (DUONEB) 0.5-2.5 (3) MG/3ML SOLN Take 3 mLs by nebulization every 6 (six) hours as needed (for coughing, wheezing or shortness of breath).   Yes [provider]  isosorbide mononitrate (IMDUR) 30 MG 24 hr tablet Take 15 mg by mouth in the morning.   Yes [provider]  loratadine (CLARITIN) 10 MG tablet Take 10 mg by mouth daily.   Yes [provider]  magnesium hydroxide (MILK OF MAGNESIA) 400 MG/5ML suspension Take 30 mLs by mouth daily as needed for mild constipation (and do not use for residents with renal failure CFR <30; will need to contact MD for orders).   Yes [provider]  metoprolol tartrate (LOPRESSOR) 25 MG tablet Take 37.5 mg by mouth in the morning and at bedtime.   Yes [provider]  mometasone-formoterol (DULERA) 100-5 MCG/ACT AERO Inhale 2 puffs into the lungs See admin instructions.  Inhale 2 puffs into the lungs at 8 AM and 8 PM- gargle and rinse mouth with water after each use of this medication to help prevent dryness   Yes [provider]  Multiple Vitamin (MULTIVITAMIN WITH MINERALS) TABS tablet Take 1 tablet by mouth daily. 06/06/18  Yes Mercy Riding, MD  NON FORMULARY Take 120 mLs by mouth 3 (three) times daily. Med pass to support weight status   Yes [provider]  Nutritional Supplements (NUTRITIONAL SUPPLEMENT PO) Take 1 Dose by mouth 3 (three) times  daily with meals. Magic Cup or [employer]   Yes [provider]  polyethylene glycol (MIRALAX / GLYCOLAX) packet Take 17 g by mouth daily as needed. Patient taking differently: Take 17 g by mouth daily as needed for mild constipation (to be mixed into 4 ounces of water). 07/21/18  Yes [name], MD  promethazine (PHENERGAN) 12.5 MG tablet Take 12.5 mg by mouth 3 (three) times daily as needed for nausea or vomiting.   Yes [provider]  QUEtiapine (SEROQUEL) 50 MG tablet Take 50 mg by mouth at bedtime.   Yes [provider]  Sodium Phosphates (RA SALINE ENEMA RE) Place 1 enema rectally daily as needed (for constipation not relieved by bisacodyl suppository and call MD if no relief after enema).   Yes [provider]  tamsulosin (FLOMAX) 0.4 MG CAPS capsule Take 1 capsule (0.4 mg total) by mouth daily. 03/20/19  Yes Paz, [name], MD  verapamil (VERELAN PM) 120 MG 24 hr capsule Take 120 mg by mouth at bedtime.   Yes [provider]  NON FORMULARY Diet :Regular /Thin liquids diet .Continue Liberalized diet per DietaryNo therapeutic restrictions 01/19/20   [provider]      Allergies    Patient has no known allergies.    Review of Systems   Review of Systems  Physical Exam Updated Vital Signs BP (!) 154/81   Pulse 85   Temp 98.3 F (36.8 C) (Oral)   Resp 18   SpO2 97%  Physical Exam Vitals and nursing note reviewed.  Constitutional:      Appearance: She is well-developed.  HENT:     Head: Atraumatic.  Cardiovascular:     Rate and Rhythm: Normal rate.     Comments: Palpable DP in the affected extremity.  Ultrasound Doppler confirms the pulse Pulmonary:     Effort: Pulmonary effort is normal.  Musculoskeletal:        General: Tenderness present. No swelling.     Cervical back: Normal range of motion and neck supple.  Skin:    General: Skin is warm and dry.     Findings: Bruising and erythema present.     Comments:  Patient's left lower extremity is warm to touch, particularly around the proximal foot laterally. She has tenderness to palpation in that area.  Patient has ecchymosis covering the dorsum of her foot and distal part of the tibia.  Patient able to tolerate passive range of motion of the ankle  Neurological:     Mental Status: She is alert and oriented to person, place, and time.          ED Results / Procedures / Treatments   Labs (all labs ordered are listed, but only abnormal results are displayed) Labs Reviewed  COMPREHENSIVE METABOLIC PANEL - Abnormal; Notable for the following components:      Result Value   Glucose, Bld 64 (*)    Calcium 8.8 (*)  Total Protein 5.6 (*)    Albumin 3.1 (*)    GFR, Estimated 58 (*)    All other components within normal limits  CBC WITH DIFFERENTIAL/PLATELET - Abnormal; Notable for the following components:   RBC 3.82 (*)    Hemoglobin 11.9 (*)    Platelets 79 (*)    All other components within normal limits  PROTIME-INR - Abnormal; Notable for the following components:   Prothrombin Time 17.9 (*)    INR 1.5 (*)    All other components within normal limits  APTT    EKG None  Radiology VAS [place] LOWER EXTREMITY VENOUS (DVT) (7a-7p)  Result Date: 05/02/2022  Lower Venous DVT Study Patient Name:  BRITTNAY PIGMAN  Date of Exam:   05/02/2022 Medical Rec #: [id number]      Accession #:    [id number] Date of Birth: October 10, 1926      Patient Gender: F Patient Age:   89 years Exam Location:  [facility] Procedure:      VAS [place] LOWER EXTREMITY VENOUS (DVT) Referring Phys: Thelma Comp Atiyana Welte --------------------------------------------------------------------------------  Indications: Pain, and Bruised, swollen left foot and toes.  Comparison Study: 01/20/19 LEV negative Performing Technologist: Velva Harman Sturdivant RDMS, RVT  Examination Guidelines: A complete evaluation includes B-mode imaging, spectral Doppler, color Doppler, and power Doppler as needed  of all accessible portions of each vessel. Bilateral testing is considered an integral part of a complete examination. Limited examinations for reoccurring indications may be performed as noted. The reflux portion of the exam is performed with the patient in reverse Trendelenburg.  +-----+---------------+---------+-----------+----------+--------------+ RIGHTCompressibilityPhasicitySpontaneityPropertiesThrombus Aging +-----+---------------+---------+-----------+----------+--------------+ CFV  Full           Yes      Yes                                 +-----+---------------+---------+-----------+----------+--------------+ SFJ  Full                                                        +-----+---------------+---------+-----------+----------+--------------+   +---------+---------------+---------+-----------+----------+--------------+ LEFT     CompressibilityPhasicitySpontaneityPropertiesThrombus Aging +---------+---------------+---------+-----------+----------+--------------+ CFV      Full           Yes      Yes                                 +---------+---------------+---------+-----------+----------+--------------+ SFJ      Full                                                        +---------+---------------+---------+-----------+----------+--------------+ FV Prox  Full                                                        +---------+---------------+---------+-----------+----------+--------------+ FV Mid   Full                                                        +---------+---------------+---------+-----------+----------+--------------+  FV DistalFull                                                        +---------+---------------+---------+-----------+----------+--------------+ PFV      Full                                                        +---------+---------------+---------+-----------+----------+--------------+ POP      Full            Yes      Yes                                 +---------+---------------+---------+-----------+----------+--------------+ PTV      Full                                                        +---------+---------------+---------+-----------+----------+--------------+ PERO     Full                                                        +---------+---------------+---------+-----------+----------+--------------+     Summary: RIGHT: - No evidence of common femoral vein obstruction.  LEFT: - There is no evidence of deep vein thrombosis in the lower extremity.  - No cystic structure found in the popliteal fossa.  *See table(s) above for measurements and observations.    Preliminary    DG Foot Complete Left  Result Date: 05/02/2022 CLINICAL DATA:  Pt BIB [facility] from [facility] facility d/t staff there noticing the last week her Left foot become more gangrenous in appearance EXAM: LEFT FOOT - COMPLETE 3+ VIEW COMPARISON:  None Available. FINDINGS: No acute fracture. No bone lesion. Skeletal structures are diffusely demineralized. No bone resorption to suggest osteomyelitis. Joints are normally aligned. Small dorsal and plantar calcaneal spurs. Mild nonspecific soft tissue swelling suggested of the forefoot. No soft tissue air. IMPRESSION: 1. No fracture or dislocation. 2. No evidence of osteomyelitis. Electronically Signed   By: Lajean Manes M.D.   On: 05/02/2022 14:56    Procedures Procedures    Medications Ordered in ED Medications  cephALEXin (KEFLEX) capsule 500 mg (has no administration in time range)  acetaminophen (TYLENOL) tablet 650 mg (650 mg Oral Given 05/02/22 1854)  dextrose 50 % solution 50 mL (50 mLs Intravenous Given 05/02/22 2032)    ED Course/ Medical Decision Making/ A&P                           Medical Decision Making Amount and/or Complexity of Data Reviewed Labs: ordered. Radiology: ordered.  Risk OTC drugs. Prescription drug management.  88 year old  female with history of A-fib, DVT/PE not currently on anticoagulation comes in with chief complaint of discoloration to her left leg.  Patient is  contracted, bedbound.  There is no trauma complaints from the patient.  Unable to get any meaningful history from the patient herself, unable to get any history from the nursing home as to at times to reach them were unsuccessful.  Differential diagnosis considered includes DVT, cellulitis, ecchymosis, arterial insufficiency/critical limb ischemia, fracture, septic arthritis, gouty arthritis.  Patient able to tolerate passive ranging of the ankle.  Skin is warm to touch, and the discoloration essentially appears to be bruising.  No open ulcers noted. Patient has a palpable pulse that was confirmed with Doppler.  At this time, it does not appear that she is having critical limb ischemia.  Ultrasound DVT ordered.  Basic blood work ordered.  Given the extremity is warm to touch compared to the contralateral side, suspicion is high for cellulitis if the DVT study is negative.  9:09 PM Ultrasound negative for DVT.  We will proceed with treatment of the patient with antibiotics.  Her platelet count is 78, likely has spontaneous ecchymosis.  Platelet count is not low enough to require emergent evaluation.  Will advise that she be reassessed by medical staff at the facility.  Final Clinical Impression(s) / ED Diagnoses Final diagnoses:  Ecchymoses, spontaneous  Thrombocytopenia ([place])  Cellulitis of left lower extremity    Rx / DC Orders ED Discharge Orders          Ordered    cephALEXin (KEFLEX) 500 MG capsule  4 times daily        05/02/22 2103              Varney Biles, MD 05/02/22 2109

## 2022-05-02 NOTE — ED Notes (Signed)
Ptar called 

## 2022-05-02 NOTE — Progress Notes (Signed)
Left lower extremity venous  has been completed. Refer to [facility] under chart review to view preliminary results.   05/02/2022  3:37 PM Samiah Ricklefs, Bonnye Fava

## 2022-05-02 NOTE — ED Triage Notes (Signed)
Pt BIB [facility] from [facility] facility d/t staff there noticing the last week her Left foot become more gangrenous in appearance. Pt is A/Ox2 at baseline does have dementia at baseline, VSS.

## 2022-05-02 NOTE — Discharge Instructions (Addendum)
[name] was seen in the ER for evaluation of her skin discoloration.  She does not have a DVT.  She has a pulse over the leg and skin is warm to touch, therefore it does not appear that she has any circulation issues.  X-ray did not reveal any fracture.  There is no signs of deep space infection, but she does have some concerning findings for cellulitis.  We are starting her on antibiotics for cellulitis. We suspect that the skin discoloration is bruising.  Her platelet count is found to be at 78, which does put her at risk for having spontaneous hematoma.  We recommend that the medical staff assess her leg again in 3 to 5 days.  Warm compresses should be applied to the extremity 3-4 times a day for 10 to 15 minutes.  Bring her back to the emergency room if you start having worsening pain, worsening swelling, the bruising is spreading towards the knee or above that region.

## 2022-05-03 DIAGNOSIS — L03116 Cellulitis of left lower limb: Secondary | ICD-10-CM | POA: Diagnosis not present

## 2022-05-03 LAB — CBG MONITORING, ED: Glucose-Capillary: 81 mg/dL (ref 70–99)

## 2022-05-09 ENCOUNTER — Emergency Department (HOSPITAL_COMMUNITY): Payer: Medicare Other

## 2022-05-09 ENCOUNTER — Observation Stay (HOSPITAL_COMMUNITY)
Admission: EM | Admit: 2022-05-09 | Discharge: 2022-05-10 | Disposition: A | Discharge status: Skilled Nursing Facility | Payer: Medicare Other | Attending: Internal Medicine | Admitting: Internal Medicine

## 2022-05-09 DIAGNOSIS — Z1152 Encounter for screening for COVID-19: Secondary | ICD-10-CM | POA: Insufficient documentation

## 2022-05-09 DIAGNOSIS — F0390 Unspecified dementia without behavioral disturbance: Secondary | ICD-10-CM | POA: Insufficient documentation

## 2022-05-09 DIAGNOSIS — I509 Heart failure, unspecified: Secondary | ICD-10-CM | POA: Insufficient documentation

## 2022-05-09 DIAGNOSIS — Z8673 Personal history of transient ischemic attack (TIA), and cerebral infarction without residual deficits: Secondary | ICD-10-CM | POA: Insufficient documentation

## 2022-05-09 DIAGNOSIS — E785 Hyperlipidemia, unspecified: Secondary | ICD-10-CM | POA: Insufficient documentation

## 2022-05-09 DIAGNOSIS — I2510 Atherosclerotic heart disease of native coronary artery without angina pectoris: Secondary | ICD-10-CM | POA: Diagnosis not present

## 2022-05-09 DIAGNOSIS — N183 Chronic kidney disease, stage 3 unspecified: Secondary | ICD-10-CM | POA: Insufficient documentation

## 2022-05-09 DIAGNOSIS — I63512 Cerebral infarction due to unspecified occlusion or stenosis of left middle cerebral artery: Secondary | ICD-10-CM | POA: Diagnosis not present

## 2022-05-09 DIAGNOSIS — Z86718 Personal history of other venous thrombosis and embolism: Secondary | ICD-10-CM | POA: Diagnosis not present

## 2022-05-09 DIAGNOSIS — Z86711 Personal history of pulmonary embolism: Secondary | ICD-10-CM | POA: Diagnosis not present

## 2022-05-09 DIAGNOSIS — I639 Cerebral infarction, unspecified: Secondary | ICD-10-CM | POA: Diagnosis present

## 2022-05-09 DIAGNOSIS — I130 Hypertensive heart and chronic kidney disease with heart failure and stage 1 through stage 4 chronic kidney disease, or unspecified chronic kidney disease: Secondary | ICD-10-CM | POA: Diagnosis not present

## 2022-05-09 DIAGNOSIS — D696 Thrombocytopenia, unspecified: Secondary | ICD-10-CM | POA: Diagnosis not present

## 2022-05-09 DIAGNOSIS — Z96642 Presence of left artificial hip joint: Secondary | ICD-10-CM | POA: Diagnosis not present

## 2022-05-09 DIAGNOSIS — Z9049 Acquired absence of other specified parts of digestive tract: Secondary | ICD-10-CM | POA: Diagnosis not present

## 2022-05-09 DIAGNOSIS — Z79899 Other long term (current) drug therapy: Secondary | ICD-10-CM | POA: Diagnosis not present

## 2022-05-09 DIAGNOSIS — I6602 Occlusion and stenosis of left middle cerebral artery: Secondary | ICD-10-CM | POA: Diagnosis not present

## 2022-05-09 DIAGNOSIS — I447 Left bundle-branch block, unspecified: Secondary | ICD-10-CM | POA: Diagnosis not present

## 2022-05-09 DIAGNOSIS — R29818 Other symptoms and signs involving the nervous system: Secondary | ICD-10-CM | POA: Diagnosis present

## 2022-05-09 DIAGNOSIS — I4891 Unspecified atrial fibrillation: Secondary | ICD-10-CM | POA: Insufficient documentation

## 2022-05-09 DIAGNOSIS — Z7722 Contact with and (suspected) exposure to environmental tobacco smoke (acute) (chronic): Secondary | ICD-10-CM | POA: Diagnosis not present

## 2022-05-09 DIAGNOSIS — Z85038 Personal history of other malignant neoplasm of large intestine: Secondary | ICD-10-CM | POA: Insufficient documentation

## 2022-05-09 LAB — ETHANOL: Alcohol, Ethyl (B): <10 mg/dL (ref <10)

## 2022-05-09 LAB — I-STAT CHEM 8, ED
BUN: 32 mg/dL — ABNORMAL HIGH (ref 8–23)
Calcium, Ion: 1.14 mmol/L — ABNORMAL LOW (ref 1.15–1.40)
Chloride: 111 mmol/L (ref 98–111)
Creatinine, Ser: 0.8 mg/dL (ref 0.44–1.00)
Glucose, Bld: 126 mg/dL — ABNORMAL HIGH (ref 70–99)
HCT: 36 % (ref 36.0–46.0)
Hemoglobin: 12.2 g/dL (ref 12.0–15.0)
Potassium: 4.5 mmol/L (ref 3.5–5.1)
Sodium: 144 mmol/L (ref 135–145)
TCO2: 22 mmol/L (ref 22–32)

## 2022-05-09 LAB — CBG MONITORING, ED
Glucose-Capillary: 108 mg/dL — ABNORMAL HIGH (ref 70–99)
Glucose-Capillary: 120 mg/dL — ABNORMAL HIGH (ref 70–99)

## 2022-05-09 MED ORDER — IOHEXOL 350 MG/ML SOLN
75.0000 mL | Freq: Once | INTRAVENOUS | Status: AC | PRN
  Administered 2022-05-09: 75 mL via INTRAVENOUS

## 2022-05-09 NOTE — ED Notes (Signed)
Per Dr. Rory Percy cancel code stroke. Pt will be made comfort care only

## 2022-05-09 NOTE — Code Documentation (Signed)
Stroke Response Nurse Documentation Code Documentation  SHENIQUE CHILDERS is a 88 y.o. 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 On No antithrombotic. Code stroke was activated by EMS.   Patient from SNF where she was LKW at 1830 and now complaining of left gaze and right sided weakness.   Stroke team at the bedside on patient arrival. Labs drawn and patient cleared for CT by Dr. Mayra Neer. Patient to CT with team. NIHSS 27, see documentation for details and code stroke times. Patient with decreased LOC, disoriented, not following commands, left gaze preference , right hemianopia, right facial droop, right arm weakness, right leg weakness, right decreased sensation, Global aphasia , and dysarthria  on exam. The following imaging was completed:  CT Head and CTA. Patient is not a candidate for IV Thrombolytic due to thrombocytopenia. Patient is not a candidate for IR due to poor baseline.     Bedside handoff with ED RN Vaughan Basta.    Madelynn Done  Rapid Response RN

## 2022-05-09 NOTE — ED Notes (Signed)
Code stroke cancelled per dr. Rory Percy

## 2022-05-09 NOTE — ED Provider Notes (Signed)
[facility] EMERGENCY DEPARTMENT Provider Note   CSN: [id number] Arrival date & time: 05/09/22  2159     History  Chief Complaint  Patient presents with   Code Stroke    [name] is a 88 y.o. female with chronic thrombocytopenia, afib and DVT/PE history not on [facility], prior h/o stroke w/ residual L-sided deficits, dementia w/ behavioral disturbance who presents from facility with behavioral changes, right sided weakness, and forced L gaze.   Was not acting heself at the facility, normally is very aggressive and was not acting that way. Found to have forced left gaze deviation and R-sided weakness so a stroke code was called. Nonverbal with EMS. Has, L sided contractures/weakness at baseline d/t prior stroke.  LKN 1830 PM BP 150s POC glucose 120  HPI     Home Medications Prior to Admission medications   Medication Sig Start Date End Date Taking? Authorizing Provider  acetaminophen (TYLENOL) 500 MG tablet Take 1,000 mg by mouth every 8 (eight) hours as needed for headache (or pain).    [provider]  albuterol (PROVENTIL) (2.5 MG/3ML) 0.083% nebulizer solution Take 2.5 mg by nebulization every 6 (six) hours as needed for wheezing or shortness of breath.    [provider]  albuterol (VENTOLIN HFA) 108 (90 Base) MCG/ACT inhaler Inhale 2 puffs into the lungs every 6 (six) hours as needed for wheezing or shortness of breath.    [provider]  atorvastatin (LIPITOR) 20 MG tablet Take 1 tablet (20 mg total) by mouth daily. Patient taking differently: Take 20 mg by mouth at bedtime. 07/04/19   Colon Branch, MD  bisacodyl (DULCOLAX) 10 MG suppository Place 10 mg rectally daily as needed (for constipation not relieved by Milk of Magnesia).    [provider]  Calcium-Vitamin D (OYSTER SHELL CALCIUM/VIT D3) 500-5 MG-MCG TABS Take 1 tablet by mouth daily with breakfast.    [provider]  carboxymethylcellulose 1 %  ophthalmic solution Place 2 drops into the left eye 4 (four) times daily as needed (dry eye).    [provider]  cephALEXin (KEFLEX) 500 MG capsule Take 1 capsule (500 mg total) by mouth 4 (four) times daily. 05/02/22   Varney Biles, MD  escitalopram (LEXAPRO) 5 MG tablet Take 5 mg by mouth daily.    [provider]  guaiFENesin (MUCINEX) 600 MG 12 hr tablet Take 600 mg by mouth in the morning and at bedtime. 03/21/20   [provider]  hydrocortisone (ANUSOL-HC) 2.5 % rectal cream Place 1 application rectally 2 (two) times daily as needed for hemorrhoids or anal itching.    [provider]  ipratropium-albuterol (DUONEB) 0.5-2.5 (3) MG/3ML SOLN Take 3 mLs by nebulization every 6 (six) hours as needed (for coughing, wheezing or shortness of breath).    [provider]  isosorbide mononitrate (IMDUR) 30 MG 24 hr tablet Take 15 mg by mouth in the morning.    [provider]  loratadine (CLARITIN) 10 MG tablet Take 10 mg by mouth daily.    [provider]  magnesium hydroxide (MILK OF MAGNESIA) 400 MG/5ML suspension Take 30 mLs by mouth daily as needed for mild constipation (and do not use for residents with renal failure CFR <30; will need to contact MD for orders).    [provider]  metoprolol tartrate (LOPRESSOR) 25 MG tablet Take 37.5 mg by mouth in the morning and at bedtime.    [provider]  mometasone-formoterol [facility])  100-5 MCG/ACT AERO Inhale 2 puffs into the lungs See admin instructions. Inhale 2 puffs into the lungs at 8 AM and 8 PM- gargle and rinse mouth with water after each use of this medication to help prevent dryness    [provider]  Multiple Vitamin (MULTIVITAMIN WITH MINERALS) TABS tablet Take 1 tablet by mouth daily. 06/06/18   Mercy Riding, MD  NON FORMULARY Diet :Regular Lennie Hummer liquids diet .Continue Liberalized diet per DietaryNo therapeutic restrictions 01/19/20   [provider]  NON FORMULARY Take 120 mLs by mouth 3 (three) times daily. Med pass to support weight status    [provider]  Nutritional Supplements (NUTRITIONAL SUPPLEMENT PO) Take 1 Dose by mouth 3 (three) times daily with meals. Magic Cup or [employer], Historical, MD  polyethylene glycol (MIRALAX / GLYCOLAX) packet Take 17 g by mouth daily as needed. Patient taking differently: Take 17 g by mouth daily as needed for mild constipation (to be mixed into 4 ounces of water). 07/21/18   [name], MD  promethazine (PHENERGAN) 12.5 MG tablet Take 12.5 mg by mouth 3 (three) times daily as needed for nausea or vomiting.    [provider]  QUEtiapine (SEROQUEL) 50 MG tablet Take 50 mg by mouth at bedtime.    [provider]  Sodium Phosphates (RA SALINE ENEMA RE) Place 1 enema rectally daily as needed (for constipation not relieved by bisacodyl suppository and call MD if no relief after enema).    [provider]  tamsulosin (FLOMAX) 0.4 MG CAPS capsule Take 1 capsule (0.4 mg total) by mouth daily. 03/20/19   Colon Branch, MD  verapamil (VERELAN PM) 120 MG 24 hr capsule Take 120 mg by mouth at bedtime.    [provider]      Allergies    Patient has no known allergies.    Review of Systems   Review of Systems  Unable to perform ROS: Patient nonverbal    Physical Exam Updated Vital Signs BP (!) 157/72   Pulse 84   Temp 97.6 F (36.4 C) (Axillary)   Resp (!) 21   SpO2 96%  Physical Exam General: Drowsy, does not open eyes to voice HEENT: Normocephalic atraumatic Lungs clear Cardiovascular: Irregularly irregular Abdomen nondistended nontender Musculoskeletal: Extreme scoliosis and kyphosis. Neurologic exam Drowsy, does not open eyes to voice Does not follow commands Nonverbal Cranial nerves: Forced left gaze deviation, does not blink to threat from the right, blinks to threat somewhat inconsistently from the left,  right lower facial weakness noticed. Motor examination: Flaccid right upper and lower extremity.  Nearly full strength left upper and lower extremity with spontaneous movement Sensation: No withdrawal to noxious simulation on the right, strong withdrawal on the left Coordination difficult to assess NIH stroke scale-27  ED Results / Procedures / Treatments   Labs (all labs ordered are listed, but only abnormal results are displayed) Labs Reviewed  CBG MONITORING, ED - Abnormal; Notable for the following components:      Result Value   Glucose-Capillary 120 (*)    All other components within normal limits    EKG EKG Interpretation  Date/Time:  Saturday May 09 2022 22:35:23 EST Ventricular Rate:  72 PR Interval:    QRS Duration: 117 QT Interval:  454 QTC Calculation: 497 R Axis:   -60 Text Interpretation: Atrial fibrillation Incomplete left bundle branch block Probable left ventricular hypertrophy Anterior Q waves, possibly due to LVH Confirmed  by Kommor, Debe Coder [phone]) on 05/16/2022 11:55:35 AM  Radiology CTH: IMPRESSION: 1. Hyperdense left M1 segment, concerning for large vessel occlusion. No evolving ischemic changes by CT. No intracranial hemorrhage. 2. ASPECTS is 10. 3. Underlying atrophy with advanced chronic microvascular ischemic disease. Remote right cerebellar infarct.   CTA H&N: IMPRESSION: 1. Acute occlusion of the left internal carotid artery just distal to the bifurcation, which remains occluded to the terminus. Associated left M1 occlusion. Scant flow within the distal left MCA branches, which could be collateral nature. 2. Atheromatous change about the carotid bulb with associated stenoses of up to 65% on the left and 50% on the right. 3. Severe ostial stenoses at the origins of both vertebral arteries. 4. 2 mm outpouching at the anterior communicating artery complex, possibly a small aneurysm. 5. 2 cm hypodense mass at the right nasal vestibule,  indeterminate. Correlation with physical exam recommended.  Procedures .Critical Care  Performed by: Audley Hose, MD Authorized by: Audley Hose, MD   Critical care provider statement:    Critical care time (minutes):  30   Critical care was necessary to treat or prevent imminent or life-threatening deterioration of the following conditions:  CNS failure or compromise   Critical care was time spent personally by me on the following activities:  Development of treatment plan with patient or surrogate, discussions with consultants, examination of patient, ordering and review of laboratory studies, ordering and review of radiographic studies, ordering and performing treatments and interventions, pulse oximetry, re-evaluation of patient's condition, review of old charts and obtaining history from patient or surrogate   Care discussed with: admitting provider       Medications Ordered in ED Medications  iohexol (OMNIPAQUE) 350 MG/ML injection 75 mL (75 mLs Intravenous Contrast Given 05/09/22 2234)    ED Course/ Medical Decision Making/ A&P                          Medical Decision Making Amount and/or Complexity of Data Reviewed Labs: ordered. Decision-making details documented in ED Course. Radiology: ordered.    Given the acute onset of neurological symptoms, stroke is the most concerning etiology of these acute symptoms. The neuro exam is significant for forced L gaze deviation, R sided weakness, L sided weakness (baseline), and nonverbal.   Neurology team evaluating patient at bedside. Plan to obtain emergent CT brain.  Plan: - stat head CT, consider additional imaging including CTA and perfusion scan pending initial CT  - neurology has been consulted  - consider TNK if neg head CT, however patient with chronic low platelets and is likely not a candidate - Manage hypertension as needed  I have personally reviewed the images.   Clinical Course as of 05/19/22 1110  Sat  May 09, 2022  2210 Glucose-Capillary(!): 120 [HN]  2235 With significant M1 stroke on imaging. Neurology called daughter and discussed. Thrombocytoepnia makes patient not a candidate for tPA. Her basleine health makes her not a candidate for thrombectomy. Patient is already DNAR. Neuro d/w daughter and patient is made comfort care. She will be admitted to stroke service.  [HN]    Clinical Course User Index [HN] Audley Hose, MD   Labs demonstrate glucose 126, Cr 0.08, no electrolyte abnormalities or anemia, neg EtOH,   Patient is made comfort measures after discussion with neurology and daughter. She will be admitted to hospitalist ideally with palliative on board.   Dispo: admit on comfort  Final Clinical Impression(s) / ED Diagnoses Final diagnoses:  Cerebrovascular accident (CVA) due to occlusion of left middle cerebral artery ([place])    Rx / DC Orders ED Discharge Orders     None        This note was created using dictation software, which may contain spelling or grammatical errors.    Audley Hose, MD 05/19/22 [phone]

## 2022-05-09 NOTE — ED Notes (Addendum)
Admit provider at bedside at this time 

## 2022-05-09 NOTE — Consult Note (Signed)
Neurology Consultation  Reason for Consult: Stroke code right-sided weakness left forced gaze Referring Physician: Dr. Mayra Neer  CC: Forced left gaze, right-sided weakness  History is obtained from: Chart, patient's daughter  HPI: [name] is a 88 y.o. female past medical history of chronic thrombocytopenia, atrial fibrillation DVT PE not on anticoagulation, prior history of stroke with residual left-sided weakness, dementia with behavioral disturbance-presenting from her facility with last well at 1830 hrs. for concern for strokelike symptoms due to being nonverbal. At baseline she is able to have a conversation, usually is very aggressive and curses a lot but since 1830 was noted to be not being like herself.  Since yesterday, she has been very mellow and has not been as aggressive towards the staff as she usually is. EMS was called, they noted her to have forced left gaze for which a code stroke was activated.  She was also noted to be weak on the right side. She was seen emergently as an acute code stroke at the ER bridge-high NIH stroke scale pointing towards a left hemispheric stroke-head CT with early changes of stroke in the left hemisphere as well as left ICA and left MCA occlusion on CTA. Unfortunately, chronic thrombocytopenia precludes TNK administration and poor baseline precludes intervention.  LKW: 1830 hrs. IV thrombolysis given?: no, chronic thrombocytopenia with platelet count always less than 100,000 Premorbid modified Rankin scale (mRS):4-5   ROS: Unable to obtain due to altered mental status.   Past Medical History:  Diagnosis Date   Acute encephalopathy    Acute renal failure superimposed on stage 3 chronic kidney disease (HCC)    Anemia    Atrial fibrillation (HCC)    Colon cancer ([place]) 12/11   Colon cancer ([place]) 2011   Diarrhea    DVT (deep venous thrombosis) ([place]) 11/06   Elevated brain natriuretic peptide (BNP) level    Frequent headaches    Heart murmur     Hyperkalemia    Hypertension    Pulmonary embolism ([place]) 11/06   Seizures ([place])    Stroke ([place]) 11/30/2014   Urine retention 01/2019     Family History  Problem Relation Age of Onset   Peripheral vascular disease Father    Heart attack Mother 31   Heart disease Mother        MI   Coronary artery disease Sister    Diabetes Paternal Grandmother    Coronary artery disease Maternal Grandfather      Social History:   reports that she is a non-smoker but has been exposed to tobacco smoke. She has never used smokeless tobacco. She reports that she does not drink alcohol and does not use drugs.  Medications No current facility-administered medications for this encounter.  Current Outpatient Medications:    acetaminophen (TYLENOL) 500 MG tablet, Take 1,000 mg by mouth every 8 (eight) hours as needed for headache (or pain)., Disp: , Rfl:    albuterol (PROVENTIL) (2.5 MG/3ML) 0.083% nebulizer solution, Take 2.5 mg by nebulization every 6 (six) hours as needed for wheezing or shortness of breath., Disp: , Rfl:    albuterol (VENTOLIN HFA) 108 (90 Base) MCG/ACT inhaler, Inhale 2 puffs into the lungs every 6 (six) hours as needed for wheezing or shortness of breath., Disp: , Rfl:    atorvastatin (LIPITOR) 20 MG tablet, Take 1 tablet (20 mg total) by mouth daily. (Patient taking differently: Take 20 mg by mouth at bedtime.), Disp: 90 tablet, Rfl: 1   bisacodyl (DULCOLAX) 10 MG suppository,  Place 10 mg rectally daily as needed (for constipation not relieved by Milk of Magnesia)., Disp: , Rfl:    Calcium-Vitamin D (OYSTER SHELL CALCIUM/VIT D3) 500-5 MG-MCG TABS, Take 1 tablet by mouth daily with breakfast., Disp: , Rfl:    carboxymethylcellulose 1 % ophthalmic solution, Place 2 drops into the left eye 4 (four) times daily as needed (dry eye)., Disp: , Rfl:    cephALEXin (KEFLEX) 500 MG capsule, Take 1 capsule (500 mg total) by mouth 4 (four) times daily., Disp: 28 capsule, Rfl: 0   escitalopram  (LEXAPRO) 5 MG tablet, Take 5 mg by mouth daily., Disp: , Rfl:    guaiFENesin (MUCINEX) 600 MG 12 hr tablet, Take 600 mg by mouth in the morning and at bedtime., Disp: , Rfl:    hydrocortisone (ANUSOL-HC) 2.5 % rectal cream, Place 1 application rectally 2 (two) times daily as needed for hemorrhoids or anal itching., Disp: , Rfl:    ipratropium-albuterol (DUONEB) 0.5-2.5 (3) MG/3ML SOLN, Take 3 mLs by nebulization every 6 (six) hours as needed (for coughing, wheezing or shortness of breath)., Disp: , Rfl:    isosorbide mononitrate (IMDUR) 30 MG 24 hr tablet, Take 15 mg by mouth in the morning., Disp: , Rfl:    loratadine (CLARITIN) 10 MG tablet, Take 10 mg by mouth daily., Disp: , Rfl:    magnesium hydroxide (MILK OF MAGNESIA) 400 MG/5ML suspension, Take 30 mLs by mouth daily as needed for mild constipation (and do not use for residents with renal failure CFR <30; will need to contact MD for orders)., Disp: , Rfl:    metoprolol tartrate (LOPRESSOR) 25 MG tablet, Take 37.5 mg by mouth in the morning and at bedtime., Disp: , Rfl:    mometasone-formoterol (DULERA) 100-5 MCG/ACT AERO, Inhale 2 puffs into the lungs See admin instructions. Inhale 2 puffs into the lungs at 8 AM and 8 PM- gargle and rinse mouth with water after each use of this medication to help prevent dryness, Disp: , Rfl:    Multiple Vitamin (MULTIVITAMIN WITH MINERALS) TABS tablet, Take 1 tablet by mouth daily., Disp: 30 tablet, Rfl: 0   NON FORMULARY, Diet :Regular /Thin liquids diet .Continue Liberalized diet per DietaryNo therapeutic restrictions, Disp: , Rfl:    NON FORMULARY, Take 120 mLs by mouth 3 (three) times daily. Med pass to support weight status, Disp: , Rfl:    Nutritional Supplements (NUTRITIONAL SUPPLEMENT PO), Take 1 Dose by mouth 3 (three) times daily with meals. [employer] or [employer], Disp: , Rfl:    polyethylene glycol (MIRALAX / GLYCOLAX) packet, Take 17 g by mouth daily as needed. (Patient taking differently: Take  17 g by mouth daily as needed for mild constipation (to be mixed into 4 ounces of water).), Disp: 14 each, Rfl: 0   promethazine (PHENERGAN) 12.5 MG tablet, Take 12.5 mg by mouth 3 (three) times daily as needed for nausea or vomiting., Disp: , Rfl:    QUEtiapine (SEROQUEL) 50 MG tablet, Take 50 mg by mouth at bedtime., Disp: , Rfl:    Sodium Phosphates (RA SALINE ENEMA RE), Place 1 enema rectally daily as needed (for constipation not relieved by bisacodyl suppository and call MD if no relief after enema)., Disp: , Rfl:    tamsulosin (FLOMAX) 0.4 MG CAPS capsule, Take 1 capsule (0.4 mg total) by mouth daily., Disp: 90 capsule, Rfl: 1   verapamil (VERELAN PM) 120 MG 24 hr capsule, Take 120 mg by mouth at bedtime., Disp: , Rfl:  Exam: Current vital signs: Temp (!) 97.5 F (36.4 C) (Axillary)  Vital signs in last 24 hours: Temp:  [97.5 F (36.4 C)] 97.5 F (36.4 C) (11/04 2236)  General: Drowsy, does not open eyes to voice HEENT: Normocephalic atraumatic Lungs clear Cardiovascular: Irregularly irregular Abdomen nondistended nontender Musculoskeletal: Extreme scoliosis and kyphosis. Neurologic exam Drowsy, does not open eyes to voice Does not follow commands Nonverbal Cranial nerves: Forced left gaze deviation, does not blink to threat from the right, blinks to threat somewhat inconsistently from the left, right lower facial weakness noticed. Motor examination: Flaccid right upper and lower extremity.  Nearly full strength left upper and lower extremity with spontaneous movement Sensation: No withdrawal to noxious simulation on the right, strong withdrawal on the left Coordination difficult to assess NIH stroke scale-27   Labs I have reviewed labs in epic and the results pertinent to this consultation are:  CBC    Component Value Date/Time   WBC 4.8 05/02/2022 1417   RBC 3.82 (L) 05/02/2022 1417   HGB 12.2 05/09/2022 2207   HGB 14.5 01/13/2018 1620   HGB 15.2 06/09/2012 1509    HCT 36.0 05/09/2022 2207   HCT 42.8 01/13/2018 1620   HCT 44.7 06/09/2012 1509   PLT 79 (L) 05/02/2022 1417   PLT 107 (L) 01/13/2018 1620   MCV 99.0 05/02/2022 1417   MCV 90 01/13/2018 1620   MCV 94.9 06/09/2012 1509   MCH 31.2 05/02/2022 1417   MCHC 31.5 05/02/2022 1417   RDW 15.0 05/02/2022 1417   RDW 15.0 01/13/2018 1620   RDW 13.3 06/09/2012 1509   LYMPHSABS 0.9 05/02/2022 1417   LYMPHSABS 1.5 06/09/2012 1509   MONOABS 0.5 05/02/2022 1417   MONOABS 0.5 06/09/2012 1509   EOSABS 0.2 05/02/2022 1417   EOSABS 0.1 06/09/2012 1509   BASOSABS 0.0 05/02/2022 1417   BASOSABS 0.0 06/09/2012 1509    CMP     Component Value Date/Time   NA 144 05/09/2022 2207   NA 146 03/22/2020 0000   NA 142 06/09/2012 1509   K 4.5 05/09/2022 2207   K 3.9 06/09/2012 1509   CL 111 05/09/2022 2207   CL 108 (H) 06/09/2012 1509   CO2 27 05/02/2022 1417   CO2 26 06/09/2012 1509   GLUCOSE 126 (H) 05/09/2022 2207   GLUCOSE 103 (H) 06/09/2012 1509   BUN 32 (H) 05/09/2022 2207   BUN 30 (A) 03/22/2020 0000   BUN 17.0 06/09/2012 1509   CREATININE 0.80 05/09/2022 2207   CREATININE 1.51 (H) 07/22/2018 1624   CREATININE 1.2 (H) 06/09/2012 1509   CALCIUM 8.8 (L) 05/02/2022 1417   CALCIUM 9.5 06/09/2012 1509   PROT 5.6 (L) 05/02/2022 1417   PROT 6.9 01/13/2018 1620   PROT 7.2 06/09/2012 1509   ALBUMIN 3.1 (L) 05/02/2022 1417   ALBUMIN 4.6 01/13/2018 1620   ALBUMIN 4.2 06/09/2012 1509   AST 18 05/02/2022 1417   AST 17 06/09/2012 1509   ALT 10 05/02/2022 1417   ALT 11 06/09/2012 1509   ALKPHOS 53 05/02/2022 1417   ALKPHOS 47 06/09/2012 1509   BILITOT 0.8 05/02/2022 1417   BILITOT 1.1 01/13/2018 1620   BILITOT 0.73 06/09/2012 1509   GFRNONAA 58 (L) 05/02/2022 1417   GFRAA 47.22 03/22/2020 0000   Imaging I have reviewed the images obtained: CT head aspects 10, hyperdense left M1 concerning for LVO. CT angio head and neck-acute left ICA/MCA occlusion  Assessment: 88 year old woman who has a  past medical history dementia with  behavioral disturbance, A-fib not on anticoagulation amongst other comorbidities as well as extremely poor baseline with a modified Rankin score of 4 presenting with left hemispheric stroke due to left ICA left MCA occlusion likely cardioembolic. At this time, not a candidate for IV thrombolysis due to chronic thrombocytopenia. Not a candidate for EVT due to poor baseline status Unfortunately at this time we are out of options to offer for her given her advanced age and comorbidities and poor baseline. Had a detailed discussion with the daughter regarding this.  This will be a large devastating stroke that will involve her speech and dominant side motor and sensory functions.  Impression: Catastrophic left hemispheric acute ischemic stroke Left ICA left MCA occlusion Likely cardioembolic source Not amenable to intervention due to the above reasons-comfort measures is what family would like  Recommendations: Spoke with the daughter Ms. [name] in detail.  She is unfortunately not able to come at this time-she has a husband who has dementia who she is taking care of.  She will be in the hospital after 9 AM tomorrow.  I have given her the main hospital phone number to call. They would proceed with comfort measures only.  Ideally I would consult the palliative team but at this point, I do not think they are available and hospitalist service may need to be consulted. Inpatient neurology will be available as needed  Plan was discussed with Dr. Mayra Neer Please call with questions   Code stroke was canceled after decision to pursue comfort measures only -- Amie Portland, MD Neurologist [facility] Pager: [phone]  CRITICAL CARE ATTESTATION Performed by: Amie Portland, MD Total critical care time: 50 minutes Critical care time was exclusive of separately billable procedures and treating other patients and/or supervising  APPs/Residents/Students Critical care was necessary to treat or prevent imminent or life-threatening deterioration due to acute ischemic stroke. ICA and MCA occlusion  This patient is critically ill and at significant risk for neurological worsening and/or death and care requires constant monitoring. Critical care was time spent personally by me on the following activities: development of treatment plan with patient and/or surrogate as well as nursing, discussions with consultants, evaluation of patient's response to treatment, examination of patient, obtaining history from patient or surrogate, ordering and performing treatments and interventions, ordering and review of laboratory studies, ordering and review of radiographic studies, pulse oximetry, re-evaluation of patient's condition, participation in multidisciplinary rounds and medical decision making of high complexity in the care of this patient.

## 2022-05-09 NOTE — ED Triage Notes (Signed)
BIB [facility] from [facility] facility due to abnormal behavior and CVA symptoms. Pt has a hx of dementia and is normally very aggressive physically and verbally however over the last 24 hrs pt has been abnormally nice. Pt eyes are forced deviated to the lt. Rt side is flaccid and normally pt is verbal however is not. Previous CVA with lt side deficits. Pt is contracted in arms and legs and neck is kyphotic. Pt is not moving rt side at t his time.

## 2022-05-09 NOTE — Hospital Course (Signed)
Patient presented from nursing home when she was noted to have significant personality changes. These personality changes were noted at 1830 05/09/2022 and staff called EMS. She was noted by EMS to have leftward gaze and weakness on the right. On arrival CT head revealed a hyperdense left M1 segment concerning for large vessel occlusion, no evolving ischemic changes, no intracranial hemorrhage, underlying atrophy with advanced chronic microvascular ischemic disease, and remote right cerebellar infarct. CTA head and neck revealed acute occlusion of the left internal carotid artery just distal to the bifurcation, which remains occluded to the terminus; associated left M1 occlusion and scant flow within the distal left MCA branches; atheromatous change about the carotid bulb with associated stenoses of up to 65% on the left and 50% on the right; severe ostial stenoses at the origins of both vertebral arteries; 2 mm outpouching at the anterior communicating artery complex, possibly a small aneurysm; and  2 cm hypodense mass at the right nasal vestibule, indeterminate. She was not a candidate for TNK or thrombectomy. Family elected to transition to comfort care with wishes to return to her nursing home if able.

## 2022-05-10 DIAGNOSIS — D696 Thrombocytopenia, unspecified: Secondary | ICD-10-CM | POA: Diagnosis not present

## 2022-05-10 DIAGNOSIS — I63512 Cerebral infarction due to unspecified occlusion or stenosis of left middle cerebral artery: Secondary | ICD-10-CM | POA: Diagnosis not present

## 2022-05-10 DIAGNOSIS — I6350 Cerebral infarction due to unspecified occlusion or stenosis of unspecified cerebral artery: Secondary | ICD-10-CM

## 2022-05-10 DIAGNOSIS — I6602 Occlusion and stenosis of left middle cerebral artery: Secondary | ICD-10-CM | POA: Diagnosis not present

## 2022-05-10 DIAGNOSIS — F0390 Unspecified dementia without behavioral disturbance: Secondary | ICD-10-CM | POA: Diagnosis not present

## 2022-05-10 LAB — CBC WITH DIFFERENTIAL/PLATELET
Abs Immature Granulocytes: 0.02 10*3/uL (ref 0.00–0.07)
Basophils Absolute: 0 10*3/uL (ref 0.0–0.1)
Basophils Relative: 0 %
Eosinophils Absolute: 0.1 10*3/uL (ref 0.0–0.5)
Eosinophils Relative: 2 %
HCT: 36.7 % (ref 36.0–46.0)
Hemoglobin: 11.7 g/dL — ABNORMAL LOW (ref 12.0–15.0)
Immature Granulocytes: 0 %
Lymphocytes Relative: 17 %
Lymphs Abs: 0.8 10*3/uL (ref 0.7–4.0)
MCH: 30.9 pg (ref 26.0–34.0)
MCHC: 31.9 g/dL (ref 30.0–36.0)
MCV: 96.8 fL (ref 80.0–100.0)
Monocytes Absolute: 0.4 10*3/uL (ref 0.1–1.0)
Monocytes Relative: 7 %
Neutro Abs: 3.7 10*3/uL (ref 1.7–7.7)
Neutrophils Relative %: 74 %
Platelets: 68 10*3/uL — ABNORMAL LOW (ref 150–400)
RBC: 3.79 MIL/uL — ABNORMAL LOW (ref 3.87–5.11)
RDW: 15.1 % (ref 11.5–15.5)
WBC: 5 10*3/uL (ref 4.0–10.5)
nRBC: 0 % (ref 0.0–0.2)

## 2022-05-10 LAB — APTT: aPTT: 30 seconds (ref 24–36)

## 2022-05-10 LAB — COMPREHENSIVE METABOLIC PANEL
ALT: 15 U/L (ref 0–44)
AST: 22 U/L (ref 15–41)
Albumin: 3.4 g/dL — ABNORMAL LOW (ref 3.5–5.0)
Alkaline Phosphatase: 60 U/L (ref 38–126)
Anion gap: 9 (ref 5–15)
BUN: 31 mg/dL — ABNORMAL HIGH (ref 8–23)
CO2: 24 mmol/L (ref 22–32)
Calcium: 8.7 mg/dL — ABNORMAL LOW (ref 8.9–10.3)
Chloride: 108 mmol/L (ref 98–111)
Creatinine, Ser: 0.97 mg/dL (ref 0.44–1.00)
GFR, Estimated: 54 mL/min — ABNORMAL LOW (ref >60)
Glucose, Bld: 105 mg/dL — ABNORMAL HIGH (ref 70–99)
Potassium: 4.2 mmol/L (ref 3.5–5.1)
Sodium: 141 mmol/L (ref 135–145)
Total Bilirubin: 0.7 mg/dL (ref 0.3–1.2)
Total Protein: 6.4 g/dL — ABNORMAL LOW (ref 6.5–8.1)

## 2022-05-10 LAB — RESP PANEL BY RT-PCR (FLU A&B, COVID) ARPGX2
Influenza A by PCR: NEGATIVE
Influenza B by PCR: NEGATIVE
SARS Coronavirus 2 by RT PCR: NEGATIVE

## 2022-05-10 LAB — PROTIME-INR
INR: 1.4 — ABNORMAL HIGH (ref 0.8–1.2)
Prothrombin Time: 17.1 seconds — ABNORMAL HIGH (ref 11.4–15.2)

## 2022-05-10 MED ORDER — ACETAMINOPHEN 650 MG RE SUPP: 650.0000 mg | Freq: Four times a day (QID) | RECTAL | Status: DC | PRN

## 2022-05-10 MED ORDER — HYDROMORPHONE HCL 1 MG/ML IJ SOLN: 0.5000 mg | INTRAMUSCULAR | 0 refills | Status: AC | PRN

## 2022-05-10 MED ORDER — GLYCOPYRROLATE 0.2 MG/ML IJ SOLN: 0.1000 mg | Freq: Once | INTRAMUSCULAR | Status: DC

## 2022-05-10 MED ORDER — ACETAMINOPHEN 325 MG PO TABS: 650.0000 mg | ORAL_TABLET | Freq: Four times a day (QID) | ORAL | Status: DC | PRN

## 2022-05-10 MED ORDER — BIOTENE DRY MOUTH MT LIQD: 15.0000 mL | OROMUCOSAL | 2 refills | Status: AC | PRN

## 2022-05-10 MED ORDER — BIOTENE DRY MOUTH MT LIQD: 15.0000 mL | OROMUCOSAL | 2 refills | Status: DC | PRN

## 2022-05-10 MED ORDER — GLYCOPYRROLATE 0.2 MG/ML IJ SOLN
0.2000 mg | INTRAMUSCULAR | Status: DC | PRN
  Administered 2022-05-10 (×2): 0.2 mg via INTRAVENOUS
  Filled 2022-05-10 (×2): qty 1

## 2022-05-10 MED ORDER — POLYVINYL ALCOHOL 1.4 % OP SOLN: 1.0000 [drp] | Freq: Four times a day (QID) | OPHTHALMIC | 0 refills | Status: DC | PRN

## 2022-05-10 MED ORDER — IPRATROPIUM-ALBUTEROL 0.5-2.5 (3) MG/3ML IN SOLN: 3.0000 mL | RESPIRATORY_TRACT | Status: DC | PRN

## 2022-05-10 MED ORDER — IPRATROPIUM-ALBUTEROL 0.5-2.5 (3) MG/3ML IN SOLN: 3.0000 mL | RESPIRATORY_TRACT | 2 refills | Status: AC | PRN

## 2022-05-10 MED ORDER — ONDANSETRON 4 MG PO TBDP: 4.0000 mg | ORAL_TABLET | Freq: Four times a day (QID) | ORAL | Status: DC | PRN

## 2022-05-10 MED ORDER — ONDANSETRON HCL 4 MG/2ML IJ SOLN: 4.0000 mg | Freq: Four times a day (QID) | INTRAMUSCULAR | 0 refills | Status: AC | PRN

## 2022-05-10 MED ORDER — ONDANSETRON HCL 4 MG/2ML IJ SOLN: 4.0000 mg | Freq: Four times a day (QID) | INTRAMUSCULAR | Status: DC | PRN

## 2022-05-10 MED ORDER — IPRATROPIUM-ALBUTEROL 0.5-2.5 (3) MG/3ML IN SOLN
3.0000 mL | Freq: Four times a day (QID) | RESPIRATORY_TRACT | Status: DC
  Filled 2022-05-10: qty 3

## 2022-05-10 MED ORDER — POLYVINYL ALCOHOL 1.4 % OP SOLN: 1.0000 [drp] | Freq: Four times a day (QID) | OPHTHALMIC | Status: DC | PRN

## 2022-05-10 MED ORDER — HYDROMORPHONE HCL 1 MG/ML IJ SOLN: 0.5000 mg | INTRAMUSCULAR | 0 refills | Status: DC | PRN

## 2022-05-10 MED ORDER — GLYCOPYRROLATE 0.2 MG/ML IJ SOLN: 0.2000 mg | INTRAMUSCULAR | Status: DC | PRN

## 2022-05-10 MED ORDER — HYDROMORPHONE HCL 1 MG/ML IJ SOLN
0.5000 mg | INTRAMUSCULAR | Status: DC | PRN
  Administered 2022-05-10: 0.5 mg via INTRAVENOUS
  Filled 2022-05-10: qty 1

## 2022-05-10 MED ORDER — GLYCOPYRROLATE 0.2 MG/ML IJ SOLN: 0.2000 mg | INTRAMUSCULAR | 3 refills | Status: DC | PRN

## 2022-05-10 MED ORDER — POLYVINYL ALCOHOL 1.4 % OP SOLN: 1.0000 [drp] | Freq: Four times a day (QID) | OPHTHALMIC | 0 refills | Status: AC | PRN

## 2022-05-10 MED ORDER — IPRATROPIUM-ALBUTEROL 0.5-2.5 (3) MG/3ML IN SOLN: 3.0000 mL | RESPIRATORY_TRACT | 2 refills | Status: DC | PRN

## 2022-05-10 MED ORDER — GLYCOPYRROLATE 1 MG PO TABS: 1.0000 mg | ORAL_TABLET | ORAL | Status: DC | PRN

## 2022-05-10 MED ORDER — BACLOFEN 5 MG PO TABS: 5.0000 mg | ORAL_TABLET | Freq: Two times a day (BID) | ORAL | 0 refills | Status: AC | PRN

## 2022-05-10 MED ORDER — GLYCOPYRROLATE 0.2 MG/ML IJ SOLN: 0.2000 mg | INTRAMUSCULAR | 3 refills | Status: AC | PRN

## 2022-05-10 MED ORDER — ALBUTEROL SULFATE (2.5 MG/3ML) 0.083% IN NEBU: 2.5000 mg | INHALATION_SOLUTION | RESPIRATORY_TRACT | 12 refills | Status: AC | PRN

## 2022-05-10 MED ORDER — BACLOFEN 10 MG PO TABS: 5.0000 mg | ORAL_TABLET | Freq: Two times a day (BID) | ORAL | Status: DC | PRN

## 2022-05-10 MED ORDER — BACLOFEN 5 MG PO TABS: 5.0000 mg | ORAL_TABLET | Freq: Two times a day (BID) | ORAL | 0 refills | Status: DC | PRN

## 2022-05-10 MED ORDER — ALBUTEROL SULFATE (2.5 MG/3ML) 0.083% IN NEBU: 2.5000 mg | INHALATION_SOLUTION | RESPIRATORY_TRACT | Status: DC | PRN

## 2022-05-10 MED ORDER — ALBUTEROL SULFATE (2.5 MG/3ML) 0.083% IN NEBU: 2.5000 mg | INHALATION_SOLUTION | RESPIRATORY_TRACT | 12 refills | Status: DC | PRN

## 2022-05-10 MED ORDER — BIOTENE DRY MOUTH MT LIQD: 15.0000 mL | OROMUCOSAL | Status: DC | PRN

## 2022-05-10 MED ORDER — ONDANSETRON HCL 4 MG/2ML IJ SOLN: 4.0000 mg | Freq: Four times a day (QID) | INTRAMUSCULAR | 0 refills | Status: DC | PRN

## 2022-05-10 NOTE — ED Notes (Signed)
Verbal report given to Vergia Alcon RN at this time

## 2022-05-10 NOTE — Progress Notes (Addendum)
STROKE TEAM PROGRESS NOTE   INTERVAL HISTORY Her daughter is at the bedside.  Patient is on full comfort care, daughter would like to get her back to her facility with Hospice care. Eyes are closed, Does not open eyes. Does not follow commands. Weak on the right.  Contracted, wheelchair bound at baseline.  Lots of secretions  CT head shows hyperdense left MCA sign early signs of ischemia. Vitals:   05/09/2022 0600 05/20/2022 0615 05/26/2022 0630 06/03/2022 0915  BP: (!) 150/77 (!) 162/75 (!) 157/72   Pulse: 86 82 84   Resp: 19 20 (!) 21   Temp: 97.9 F (36.6 C)   97.6 F (36.4 C)  TempSrc: Axillary   Axillary  SpO2: 96% 96% 96%    CBC:  Recent Labs  Lab 05/09/22 2207 05/09/2022 0100  WBC  --  5.0  NEUTROABS  --  3.7  HGB 12.2 11.7*  HCT 36.0 36.7  MCV  --  96.8  PLT  --  68*   Basic Metabolic Panel:  Recent Labs  Lab 05/09/22 2207 05/19/2022 0100  NA 144 141  K 4.5 4.2  CL 111 108  CO2  --  24  GLUCOSE 126* 105*  BUN 32* 31*  CREATININE 0.80 0.97  CALCIUM  --  8.7*   Lipid Panel: No results for input(s): "CHOL", "TRIG", "HDL", "CHOLHDL", "VLDL", "LDLCALC" in the last 168 hours. HgbA1c: No results for input(s): "HGBA1C" in the last 168 hours. Urine Drug Screen: No results for input(s): "LABOPIA", "COCAINSCRNUR", "LABBENZ", "AMPHETMU", "THCU", "LABBARB" in the last 168 hours.  Alcohol Level  Recent Labs  Lab 05/09/22 2155  ETH <10    IMAGING past 24 hours CT ANGIO HEAD NECK W WO CM (CODE STROKE)  Result Date: 05/09/2022 CLINICAL DATA:  Initial evaluation for acute stroke. EXAM: CT ANGIOGRAPHY HEAD AND NECK TECHNIQUE: Multidetector CT imaging of the head and neck was performed using the standard protocol during bolus administration of intravenous contrast. Multiplanar CT image reconstructions and MIPs were obtained to evaluate the vascular anatomy. Carotid stenosis measurements (when applicable) are obtained utilizing NASCET criteria, using the distal internal carotid  diameter as the denominator. RADIATION DOSE REDUCTION: This exam was performed according to the departmental dose-optimization program which includes automated exposure control, adjustment of the mA and/or kV according to patient size and/or use of iterative reconstruction technique. CONTRAST:  60m OMNIPAQUE IOHEXOL 350 MG/ML SOLN COMPARISON:  Head CT from earlier the same day. FINDINGS: CTA NECK FINDINGS Aortic arch: Examination technically limited by positioning and technique. Visualized aortic arch normal caliber with standard branch pattern. Moderate atheromatous change about the arch without hemodynamically significant stenosis. Right carotid system: Right common and internal carotid arteries are tortuous without dissection or occlusion. Atheromatous plaque about the right carotid bulb with associated stenosis of up to approximately 50% by NASCET criteria. Left carotid system: Left CCA is tortuous. Atheromatous change about the left carotid bulb with associated stenosis of up to 65% by NASCET criteria. Left ICA occludes just distal to the bifurcation and remains occluded within the neck. Vertebral arteries: Both vertebral arteries arise from subclavian arteries. No proximal subclavian artery stenosis. Atheromatous change at the origins of both vertebral arteries with associated severe ostial stenoses. Vertebral arteries otherwise patent within the neck. Skeleton: No discrete or worrisome osseous lesions. Chronic compression deformity of T2 noted. Other neck: No other visible soft tissue abnormality within the neck. 2 cm hypodense mass at the right nasal vestibule, indeterminate (series 5, image 77). Upper chest:  Partially visualized upper chest demonstrates no acute finding. Review of the MIP images confirms the above findings CTA HEAD FINDINGS Anterior circulation: Atheromatous change within the right carotid siphon with associated mild to moderate stenosis at the supraclinoid segment. Left ICA remains  occluded to the terminus. Left M1 segment occluded. Scant flow within the distal left MCA branches, which could be collateral nature. A1 segments remain patent. Tiny 2 mm outpouching at the anterior communicating artery complex, possibly a small aneurysm (series 7, image 95). ACAs patent distally. Right M1 segment and its distal branches are patent and perfused. Posterior circulation: Mild atheromatous change within the proximal V4 segments with associated mild to moderate stenoses. Both PICA appear grossly patent. Basilar patent to its distal aspect without stenosis. Superior cerebral arteries patent bilaterally. Mild atheromatous irregularity within the PCAs without high-grade stenosis. Venous sinuses: Grossly patent allowing for timing the contrast bolus. Anatomic variants: None significant. Review of the MIP images confirms the above findings IMPRESSION: 1. Acute occlusion of the left internal carotid artery just distal to the bifurcation, which remains occluded to the terminus. Associated left M1 occlusion. Scant flow within the distal left MCA branches, which could be collateral nature. 2. Atheromatous change about the carotid bulb with associated stenoses of up to 65% on the left and 50% on the right. 3. Severe ostial stenoses at the origins of both vertebral arteries. 4. 2 mm outpouching at the anterior communicating artery complex, possibly a small aneurysm. 5. 2 cm hypodense mass at the right nasal vestibule, indeterminate. Correlation with physical exam recommended. Critical results were communicated to Dr. Rory Percy at 10:29 pm on 05/09/2022 by text page via the [facility] messaging system. Electronically Signed   By: Jeannine Boga M.D.   On: 05/09/2022 22:57   CT HEAD CODE STROKE WO CONTRAST  Result Date: 05/09/2022 CLINICAL DATA:  Code stroke. These results were communicated to at 10:25 pm on 05/09/2022 by text page via the [facility] messaging system. EXAM: CT HEAD WITHOUT CONTRAST TECHNIQUE: Contiguous  axial images were obtained from the base of the skull through the vertex without intravenous contrast. RADIATION DOSE REDUCTION: This exam was performed according to the departmental dose-optimization program which includes automated exposure control, adjustment of the mA and/or kV according to patient size and/or use of iterative reconstruction technique. COMPARISON:  Prior CT from 09/17/2021. FINDINGS: Brain: Examination degraded by motion artifact. Age-related cerebral atrophy with advanced chronic microvascular disease. Multiple remote lacunar infarcts present about the bilateral basal ganglia, similar to prior. Remote right cerebellar infarct. No convincing large vessel territory infarct. No acute intracranial hemorrhage. Few scattered hyperdense foci along the anterior falx favored to reflect calcifications. No mass lesion or midline shift. Ventricular prominence related to global renal volume loss side Rocephin. No extra-axial fluid collection. Vascular: Asymmetric hyperdensity within the proximal left M1 segment, concerning for large vessel occlusion. Calcified atherosclerosis present at the skull base. Skull: Scalp soft tissues within normal limits. Calvarium grossly intact. Sinuses/Orbits: No visible abnormality about the globes orbital soft tissues on this motion degraded exam. Paranasal sinuses are clear. No mastoid effusion. Other: None. ASPECTS [facility] Stroke Program Early CT Score) - Ganglionic level infarction (caudate, lentiform nuclei, internal capsule, insula, M1-M3 cortex): 7 - Supraganglionic infarction (M4-M6 cortex): 3 Total score (0-10 with 10 being normal): 10 IMPRESSION: 1. Hyperdense left M1 segment, concerning for large vessel occlusion. No evolving ischemic changes by CT. No intracranial hemorrhage. 2. ASPECTS is 10. 3. Underlying atrophy with advanced chronic microvascular ischemic disease. Remote right cerebellar infarct.  These results were communicated to Dr. Rory Percy at 10:25 pm on  05/09/2022 by text page via the [facility] messaging system. Electronically Signed   By: Jeannine Boga M.D.   On: 05/09/2022 22:29    PHYSICAL EXAM General: Critically ill elderly female in NAD Neuro: Eyes are closed, Does not open eyes. Does not follow commands. Weak on the right eyes closed  ASSESSMENT/PLAN [name] is a 88 y.o. female with history of chronic thrombocytopenia, atrial fibrillation DVT PE not on anticoagulation, prior history of stroke with residual left-sided weakness, dementia with behavioral disturbance-presenting from her facility with last well at 1830 hrs. for concern for strokelike symptoms due to being nonverbal.   Stroke: Large Acute left MCA ischemic infarct  Etiology:  likely cardio embolic due to A fib   Code Stroke CT head  1. Hyperdense left M1 segment, concerning for large vessel occlusion. No evolving ischemic changes by CT. No intracranial hemorrhage. 2. ASPECTS is 10. 3. Underlying atrophy with advanced chronic microvascular ischemic disease. Remote right cerebellar infarct. CTA head & neck  1. Acute occlusion of the left internal carotid artery just distal to the bifurcation, which remains occluded to the terminus. Associated left M1 occlusion. Scant flow within the distal left MCA branches, which could be collateral nature. 2. Atheromatous change about the carotid bulb with associated stenoses of up to 65% on the left and 50% on the right. 3. Severe ostial stenoses at the origins of both vertebral arteries. 4. 2 mm outpouching at the anterior communicating artery complex, possibly a small aneurysm. 5. 2 cm hypodense mass at the right nasal vestibule, indeterminate. Correlation with physical exam recommended Comfort care so will not pursue further workup. Patient to be sent back to Facility under Hospice care  LDL 55 HgbA1c 5.6     Diet   Diet NPO time specified   No antithrombotic prior to admission, now on No antithrombotic.      Hospital day # 0  Beulah Gandy DNP, ACNPC-AG   STROKE MD NOTE :  I have personally obtained history,examined this patient, reviewed notes, independently viewed imaging studies, participated in medical decision making and plan of care.ROS completed by me personally and pertinent positives fully documented  I have made any additions or clarifications directly to the above note. Agree with note above.  Patient presented with a large left MCA infarct with CT scan showing hyperdense left M1 sign.  She is at significant risk for neurological worsening due to cytotoxic edema and is unlikely to survive without very aggressive supportive care.  Patient is bedridden at baseline with poor quality of life and with this devastating stroke she would be disabled even further and family does not want to go through feeding tube and nursing home and agree with full comfort care measures and transferred back to hospice nursing home.  Long discussion with patient's daughter at the bedside and answered questions. This patient is critically ill and at significant risk of neurological worsening, death and care requires constant monitoring of vital signs, hemodynamics,respiratory and cardiac monitoring, extensive review of multiple databases, frequent neurological assessment, discussion with family, other specialists and medical decision making of high complexity.I have made any additions or clarifications directly to the above note.This critical care time does not reflect procedure time, or teaching time or supervisory time of PA/NP/Med Resident etc but could involve care discussion time.  I spent 30 minutes of neurocritical care time  in the care of  this patient.  Antony Contras, MD Medical Director [facility] Stroke Center Pager: [phone] 05/13/2022 2:24 PM  To contact Stroke Continuity provider, please refer to [email or web address] After hours, contact General Neurology

## 2022-05-10 NOTE — Progress Notes (Signed)
[facility] ED 0[address] [facility]) Hospital Liaison Note   Received request from [facility], Madilyn Fireman, LCSW, for hospice services at [place] after discharge.   Spoke with patient's daughter, [name] to initiate education related to hospice philosophy, services, and team approach to care. [name] verbalized understanding of information given. Per discussion, the plan is for patient to discharge to [place] via ambulance today.    No DME needs  at this time.   Please send signed and completed DNR home with patient/family. Please provide prescriptions at discharge as needed to ensure ongoing symptom management.    [facility] information and contact numbers given to family & above information shared with TOC.   Please call with any questions/concerns.    Thank you for the opportunity to participate in this patient's care.   Zigmund Gottron  [facility] Liaison  [phone]

## 2022-05-10 NOTE — ED Notes (Signed)
Pt remains stupor at this time. Eyes remains deviated to the Lt, bilat legs and arms remain contracted. Pt able to move lt side to some extent however not moving rt side NAD noted, resp equal and non labor, side rails up x 2, call bell in reach

## 2022-05-10 NOTE — Progress Notes (Addendum)
Brief Palliative Medicine Progress Note:  PMT consult received and chart reviewed.  Discussed case with TOC and hospice liaison. Goals are clear for patient's return to [facility] today with [facility] hospice services. TOC, hospice liaison, and attending are coordinating patient's discharge to meet goals as outlined. They do not have any further PMT needs at this time.  Will sign off. If there are any imminent needs please call the service directly.  Thank you for allowing PMT to assist in the care of this patient.  Malani Lees M. Tamala [facility] Palliative Medicine Team Team Phone: [phone] NO CHARGE

## 2022-05-10 NOTE — TOC Initial Note (Signed)
Transition of Care [facility]) - Initial/Assessment Note    Patient Details  Name: [name] MRN: [id number] Date of Birth: 27-Nov-1926  Transition of Care [facility]) CM/SW Contact:    Verdell Carmine, RN Phone Number: 05/06/2022, 8:26 AM  Clinical Narrative:                    [facility] She is a DNR and family would like her placed in residential hospice.   Amber from palliative messaged about consult.   Patient Goals and CMS Choice        Expected Discharge Plan and Services                                                Prior Living Arrangements/Services                       Activities of Daily Living      Permission Sought/Granted                  Emotional Assessment              Admission diagnosis:  Stroke [facility]) [I63.9] Patient Active Problem List   Diagnosis Date Noted   Stroke [facility] 05/09/2022   CVA (cerebral vascular accident) ([place]) 05/28/2020   Left-sided weakness 05/27/2020   Constipation 05/27/2020   DNR (do not resuscitate) 05/27/2020   MCI (mild cognitive impairment) 03/07/2019   Congestive heart failure (CHF) ([place]) 01/20/2019   CAD (coronary artery disease) 06/07/2018   Pulmonary hypertension, primary ([place]) 06/07/2018   Generalized weakness    Abnormal LFTs 06/01/2018   Hallucination 06/01/2018   Edema of right lower extremity 09/28/2015   Asthma 08/14/2015   PCP NOTES >>>>>>>>>>>>>>>>>>>>>>>>>>>>>> 04/24/2015   TIA (transient ischemic attack) 12/11/2014   Dyslipidemia 12/11/2014   History of CVA (cerebrovascular accident) 12/01/2014   Thrombocytopenia ([place]) 12/01/2014   Chronic renal insufficiency, stage II (mild) 11/12/2013   Impingement syndrome, shoulder, left 11/09/2013   Acquired short bowel syndrome 11/09/2013   Hx pulmonary embolism 06/27/2013   Warfarin anticoagulation 06/27/2013   DDD (degenerative disc disease), lumbar 11/18/2012   ATRIAL FIBRILLATION    07/08/2010   COLON CANCER, HX OF 07/08/2010   DIZZINESS 10/18/2009   Essential hypertension 12/16/2007   HIP PAIN, CHRONIC 12/28/2006   ANEMIA-NOS 07/15/2006   Personal history of venous thrombosis and embolism 07/15/2006   PCP:  Pcp, No Pharmacy:   [place], [place] - [address] [place] [address] Phone: [phone] Fax: [phone]     Social Determinants of Health (SDOH) Interventions    Readmission Risk Interventions     No data to display

## 2022-05-10 NOTE — ED Notes (Signed)
Making rounds and noted audible congestion. Listened to lung sounds and heard crackles through out.

## 2022-05-10 NOTE — H&P (Signed)
Date: 05/08/2022               Patient Name:  [name] MRN: [id number]  DOB: 11-22-1926 Age / Sex: 88 y.o., female   PCP: Pcp, No         Medical Service: Internal Medicine Teaching Service         Attending Physician: Dr. Aldine Contes, MD    First Contact: Linward Natal, MD Pager: [phone]  Second Contact: Farrel Gordon, DO Pager: ED [phone]       After Hours (After 5p/  First Contact Pager: [phone]  weekends / holidays): Second Contact Pager: [phone]   SUBJECTIVE   Chief Complaint: weakness  History of Present Illness:  78 yof with hx afib not on AC, thrombocytopneia, prior CVA, dementia, CHF, who was brought from her nursing home via EMS when staff noted that patient was less aggressive than normal.   History obtained via chart and daughter as patient was not able to provide hx due to mental status. Patient is typically aggressive and will curse, but is able to have conversation. At around [place] staff noted that she was much less aggressive than normal. EMS was called who noted that the patient had a leftward gaze as well as weakness on her right side.   She was not able to receive TNK due to thrombocytopenia and could not have thrombectomy due to poor overall health. Neurology discussed with patient's daughter and the decision to pursue comfort measures was made. This was re-confirmed with daughter [name].   Meds:  No outpatient medications have been marked as taking for the 05/09/22 encounter [facility] Encounter).    Past Medical History:  Diagnosis Date   Acute encephalopathy    Acute renal failure superimposed on stage 3 chronic kidney disease (HCC)    Anemia    Atrial fibrillation ([place])    Colon cancer ([place]) 12/11   Colon cancer ([place]) 2011   Diarrhea    DVT (deep venous thrombosis) ([place]) 11/06   Elevated brain natriuretic peptide (BNP) level    Frequent headaches    Heart murmur    Hyperkalemia    Hypertension    Pulmonary embolism ([place]) 11/06   Seizures  ([place])    Stroke ([place]) 11/30/2014   Urine retention 01/2019    Past Surgical History:  Procedure Laterality Date   APPENDECTOMY  1952   COLECTOMY  06/2010   partial, Dr. Donne Hazel complicated by LLE CVT; S/P IVC umbrella & anemia   HIP FRACTURE SURGERY  2006   Trauma   TOTAL ABDOMINAL HYSTERECTOMY W/ BILATERAL SALPINGOOPHORECTOMY  1973   For Fibroids   TOTAL HIP ARTHROPLASTY  01/03/07   Left hip replacement.   VENA CAVA FILTER PLACEMENT  06/28/2010    Social:  Lives With: facility Occupation: none Support: facility Level of Function: requires assistance with adls/iadls PCP: facility PCP Substances: none  Family History:  Family History  Problem Relation Age of Onset   Peripheral vascular disease Father    Heart attack Mother 22   Heart disease Mother        MI   Coronary artery disease Sister    Diabetes Paternal Grandmother    Coronary artery disease Maternal Grandfather     Allergies: Allergies as of 05/09/2022   (No Known Allergies)    Review of Systems: A complete ROS was negative except as per HPI.   OBJECTIVE:   Physical Exam: Temperature (!) 97.5 F (36.4 C), temperature source Axillary.  Constitutional: elderly chronically  ill appearing famale HENT: atraumatic Eyes: conjunctiva non-erythematous Neck: kyphotic Cardiovascular: regular rate, irregular rhythm, no mrg Pulmonary/Chest: normal work of breathing on room air, lungs clear to auscultation bilaterally, transmitted upper airway sounds from neck Abdominal: soft, non-tender, non-distended MSK: normal bulk and tone Neurological: somnolent, leftward gaze, contracted RUE,  Skin: warm and dry Psych: unable to assess  Labs: CBC    Component Value Date/Time   WBC 5.0 05/26/2022 0100   RBC 3.79 (L) 06/01/2022 0100   HGB 11.7 (L) 05/15/2022 0100   HGB 14.5 01/13/2018 1620   HGB 15.2 06/09/2012 1509   HCT 36.7 06/03/2022 0100   HCT 42.8 01/13/2018 1620   HCT 44.7 06/09/2012 1509   PLT 68 (L)  05/14/2022 0100   PLT 107 (L) 01/13/2018 1620   MCV 96.8 06/01/2022 0100   MCV 90 01/13/2018 1620   MCV 94.9 06/09/2012 1509   MCH 30.9 05/24/2022 0100   MCHC 31.9 06/01/2022 0100   RDW 15.1 05/26/2022 0100   RDW 15.0 01/13/2018 1620   RDW 13.3 06/09/2012 1509   LYMPHSABS 0.8 05/20/2022 0100   LYMPHSABS 1.5 06/09/2012 1509   MONOABS 0.4 05/09/2022 0100   MONOABS 0.5 06/09/2012 1509   EOSABS 0.1 05/30/2022 0100   EOSABS 0.1 06/09/2012 1509   BASOSABS 0.0 05/17/2022 0100   BASOSABS 0.0 06/09/2012 1509     CMP     Component Value Date/Time   NA 141 06/01/2022 0100   NA 146 03/22/2020 0000   NA 142 06/09/2012 1509   K 4.2 05/16/2022 0100   K 3.9 06/09/2012 1509   CL 108 06/04/2022 0100   CL 108 (H) 06/09/2012 1509   CO2 24 05/16/2022 0100   CO2 26 06/09/2012 1509   GLUCOSE 105 (H) 05/21/2022 0100   GLUCOSE 103 (H) 06/09/2012 1509   BUN 31 (H) 05/12/2022 0100   BUN 30 (A) 03/22/2020 0000   BUN 17.0 06/09/2012 1509   CREATININE 0.97 05/28/2022 0100   CREATININE 1.51 (H) 07/22/2018 1624   CREATININE 1.2 (H) 06/09/2012 1509   CALCIUM 8.7 (L) 05/31/2022 0100   CALCIUM 9.5 06/09/2012 1509   PROT 6.4 (L) 05/08/2022 0100   PROT 6.9 01/13/2018 1620   PROT 7.2 06/09/2012 1509   ALBUMIN 3.4 (L) 06/02/2022 0100   ALBUMIN 4.6 01/13/2018 1620   ALBUMIN 4.2 06/09/2012 1509   AST 22 05/14/2022 0100   AST 17 06/09/2012 1509   ALT 15 05/12/2022 0100   ALT 11 06/09/2012 1509   ALKPHOS 60 06/02/2022 0100   ALKPHOS 47 06/09/2012 1509   BILITOT 0.7 05/08/2022 0100   BILITOT 1.1 01/13/2018 1620   BILITOT 0.73 06/09/2012 1509   GFRNONAA 54 (L) 05/24/2022 0100   GFRAA 47.22 03/22/2020 0000    Imaging: CT ANGIO HEAD NECK W WO CM (CODE STROKE)  Result Date: 05/09/2022 CLINICAL DATA:  Initial evaluation for acute stroke. EXAM: CT ANGIOGRAPHY HEAD AND NECK TECHNIQUE: Multidetector CT imaging of the head and neck was performed using the standard protocol during bolus administration of  intravenous contrast. Multiplanar CT image reconstructions and MIPs were obtained to evaluate the vascular anatomy. Carotid stenosis measurements (when applicable) are obtained utilizing NASCET criteria, using the distal internal carotid diameter as the denominator. RADIATION DOSE REDUCTION: This exam was performed according to the departmental dose-optimization program which includes automated exposure control, adjustment of the mA and/or kV according to patient size and/or use of iterative reconstruction technique. CONTRAST:  61m OMNIPAQUE IOHEXOL 350 MG/ML SOLN COMPARISON:  Head CT  from earlier the same day. FINDINGS: CTA NECK FINDINGS Aortic arch: Examination technically limited by positioning and technique. Visualized aortic arch normal caliber with standard branch pattern. Moderate atheromatous change about the arch without hemodynamically significant stenosis. Right carotid system: Right common and internal carotid arteries are tortuous without dissection or occlusion. Atheromatous plaque about the right carotid bulb with associated stenosis of up to approximately 50% by NASCET criteria. Left carotid system: Left CCA is tortuous. Atheromatous change about the left carotid bulb with associated stenosis of up to 65% by NASCET criteria. Left ICA occludes just distal to the bifurcation and remains occluded within the neck. Vertebral arteries: Both vertebral arteries arise from subclavian arteries. No proximal subclavian artery stenosis. Atheromatous change at the origins of both vertebral arteries with associated severe ostial stenoses. Vertebral arteries otherwise patent within the neck. Skeleton: No discrete or worrisome osseous lesions. Chronic compression deformity of T2 noted. Other neck: No other visible soft tissue abnormality within the neck. 2 cm hypodense mass at the right nasal vestibule, indeterminate (series 5, image 77). Upper chest: Partially visualized upper chest demonstrates no acute finding.  Review of the MIP images confirms the above findings CTA HEAD FINDINGS Anterior circulation: Atheromatous change within the right carotid siphon with associated mild to moderate stenosis at the supraclinoid segment. Left ICA remains occluded to the terminus. Left M1 segment occluded. Scant flow within the distal left MCA branches, which could be collateral nature. A1 segments remain patent. Tiny 2 mm outpouching at the anterior communicating artery complex, possibly a small aneurysm (series 7, image 95). ACAs patent distally. Right M1 segment and its distal branches are patent and perfused. Posterior circulation: Mild atheromatous change within the proximal V4 segments with associated mild to moderate stenoses. Both PICA appear grossly patent. Basilar patent to its distal aspect without stenosis. Superior cerebral arteries patent bilaterally. Mild atheromatous irregularity within the PCAs without high-grade stenosis. Venous sinuses: Grossly patent allowing for timing the contrast bolus. Anatomic variants: None significant. Review of the MIP images confirms the above findings IMPRESSION: 1. Acute occlusion of the left internal carotid artery just distal to the bifurcation, which remains occluded to the terminus. Associated left M1 occlusion. Scant flow within the distal left MCA branches, which could be collateral nature. 2. Atheromatous change about the carotid bulb with associated stenoses of up to 65% on the left and 50% on the right. 3. Severe ostial stenoses at the origins of both vertebral arteries. 4. 2 mm outpouching at the anterior communicating artery complex, possibly a small aneurysm. 5. 2 cm hypodense mass at the right nasal vestibule, indeterminate. Correlation with physical exam recommended. Critical results were communicated to Dr. Rory Percy at 10:29 pm on 05/09/2022 by text page via the [facility] messaging system. Electronically Signed   By: Jeannine Boga M.D.   On: 05/09/2022 22:57   CT HEAD CODE  STROKE WO CONTRAST  Result Date: 05/09/2022 CLINICAL DATA:  Code stroke. These results were communicated to at 10:25 pm on 05/09/2022 by text page via the [facility] messaging system. EXAM: CT HEAD WITHOUT CONTRAST TECHNIQUE: Contiguous axial images were obtained from the base of the skull through the vertex without intravenous contrast. RADIATION DOSE REDUCTION: This exam was performed according to the departmental dose-optimization program which includes automated exposure control, adjustment of the mA and/or kV according to patient size and/or use of iterative reconstruction technique. COMPARISON:  Prior CT from 09/17/2021. FINDINGS: Brain: Examination degraded by motion artifact. Age-related cerebral atrophy with advanced chronic microvascular disease. Multiple remote  lacunar infarcts present about the bilateral basal ganglia, similar to prior. Remote right cerebellar infarct. No convincing large vessel territory infarct. No acute intracranial hemorrhage. Few scattered hyperdense foci along the anterior falx favored to reflect calcifications. No mass lesion or midline shift. Ventricular prominence related to global renal volume loss side Rocephin. No extra-axial fluid collection. Vascular: Asymmetric hyperdensity within the proximal left M1 segment, concerning for large vessel occlusion. Calcified atherosclerosis present at the skull base. Skull: Scalp soft tissues within normal limits. Calvarium grossly intact. Sinuses/Orbits: No visible abnormality about the globes orbital soft tissues on this motion degraded exam. Paranasal sinuses are clear. No mastoid effusion. Other: None. ASPECTS [facility] Stroke Program Early CT Score) - Ganglionic level infarction (caudate, lentiform nuclei, internal capsule, insula, M1-M3 cortex): 7 - Supraganglionic infarction (M4-M6 cortex): 3 Total score (0-10 with 10 being normal): 10 IMPRESSION: 1. Hyperdense left M1 segment, concerning for large vessel occlusion. No evolving ischemic  changes by CT. No intracranial hemorrhage. 2. ASPECTS is 10. 3. Underlying atrophy with advanced chronic microvascular ischemic disease. Remote right cerebellar infarct. These results were communicated to Dr. Rory Percy at 10:25 pm on 05/09/2022 by text page via the [facility] messaging system. Electronically Signed   By: Jeannine Boga M.D.   On: 05/09/2022 22:29    EKG: personally reviewed my interpretation is atrial fibrillation   ASSESSMENT & PLAN:    Assessment & Plan by Problem: Principal Problem:   Stroke (HCC)   [name] is a 88 y.o. with pertinent PMH of afib not on AC, prior CVA, Dementia who presented with acute encephalopathy and gaze deviation and admitted for acute cerebral infarction on hospital day 0  # Acute cerebral infarction 2/2 left ICA left MCA occlusion # Dementia # thrombocytopenia Occlusion felt to be cardioembolic in nature. Patient deemed to not be candidate for TNK due to thrombocytopenia and not able to undergo thrombectomy due to overall poor health. Prior baseline cognitive function appears limited due to hx of prior CVA and dementia. Overall catastrophic stroke due to territory involved. Neurology discussed with family who opted to pursue comfort measures. Called and confirmed with daughter, [name], that plan is for comfort measures. Daughter is caring for her husband at home and not able to come into hospital tonight. She is planning on coming into hospital around [place]. She also states that she would not be able to care for her mother at home.  Requesting patient go back to [facility] or similar facility for hospice care.  Will avoid aggressive intervention at this time and instead focus on patient comfort. On evaluation, patient not responsive to verbal or tactile stimuli. She does not appear to be uncomfortable at this time. Given the extent of patient's stroke, it is possible that she expires prior to hospice placement. Family is aware of this and understands. -  comfort measures - palliative care consulted to help optimize comfort measures - TOC consulted to assist with hospice placement   # Chronic conditions HTN, Afib, HLD, CAD, CHF No further treatment of patient's chronic or acute conditions necessary as patient has suffered catastophic CVA and family has elected to pursue comfort measures.   Diet: NPO VTE: None IVF: None,None Code: DNR Prior to Admission Living Arrangement: SNF,   Anticipated Discharge Location:  hospice vs in-hospital death Barriers to Discharge: disp  Dispo: Admit patient to Observation with expected length of stay less than 2 midnights.  Signed: Delene Ruffini, MD

## 2022-05-10 NOTE — Progress Notes (Signed)
CSW spoke with admissions at [facility] who confirms patient can return to the facility.  Patient will go to room 304. Patient will be transported via [place] - RN to call when ready. The number to call for report is [phone].  Madilyn Fireman, MSW, LCSW Transitions of Care  Clinical Social Worker II [phone]

## 2022-05-10 NOTE — Discharge Summary (Signed)
Name: [name] MRN: [id number] DOB: August 15, 1926 88 y.o. PCP: Pcp, No  Date of Admission: 05/09/2022  9:59 PM Date of Discharge:  06/04/2022 Attending Physician: Dr. Dareen Piano  DISCHARGE DIAGNOSIS:  Primary Problem: Stroke [facility])   Hospital Problems: Principal Problem:   Stroke [facility])    DISCHARGE MEDICATIONS:   Allergies as of 05/23/2022   No Known Allergies      Medication List     STOP taking these medications    acetaminophen 500 MG tablet Commonly known as: TYLENOL   atorvastatin 20 MG tablet Commonly known as: LIPITOR   bisacodyl 10 MG suppository Commonly known as: DULCOLAX   cephALEXin 500 MG capsule Commonly known as: KEFLEX   escitalopram 5 MG tablet Commonly known as: LEXAPRO   guaiFENesin 600 MG 12 hr tablet Commonly known as: MUCINEX   hydrocortisone 2.5 % rectal cream Commonly known as: ANUSOL-HC   isosorbide mononitrate 30 MG 24 hr tablet Commonly known as: IMDUR   loratadine 10 MG tablet Commonly known as: CLARITIN   metoprolol tartrate 25 MG tablet Commonly known as: LOPRESSOR   MILK OF MAGNESIA PO   mometasone-formoterol 100-5 MCG/ACT Aero Commonly known as: DULERA   multivitamin with minerals Tabs tablet   Nutritional Drink Liqd   Nyamyc powder Generic drug: nystatin   Oyster Shell Calcium/Vit D3 500-5 MG-MCG Tabs Generic drug: Calcium Carb-Cholecalciferol   polyethylene glycol 17 g packet Commonly known as: MIRALAX / GLYCOLAX   promethazine 12.5 MG tablet Commonly known as: PHENERGAN   QUEtiapine 50 MG tablet Commonly known as: SEROQUEL   RA SALINE ENEMA RE   REFRESH OP   tamsulosin 0.4 MG Caps capsule Commonly known as: FLOMAX   verapamil 120 MG 24 hr capsule Commonly known as: VERELAN PM       TAKE these medications    albuterol (2.5 MG/3ML) 0.083% nebulizer solution Commonly known as: PROVENTIL Take 3 mLs (2.5 mg total) by nebulization every 2 (two) hours as needed for wheezing. What changed:   when to take this reasons to take this Another medication with the same name was removed. Continue taking this medication, and follow the directions you see here.   antiseptic oral rinse Liqd Apply 15 mLs topically as needed for dry mouth.   Baclofen 5 MG Tabs Take 5 mg by mouth every 12 (twelve) hours as needed for muscle spasms (hiccoughs).   glycopyrrolate 0.2 MG/ML injection Commonly known as: ROBINUL Inject 1 mL (0.2 mg total) into the vein every 4 (four) hours as needed (excessive secretions).   HYDROmorphone 1 MG/ML injection Commonly known as: DILAUDID Inject 0.5 mLs (0.5 mg total) into the vein every 2 (two) hours as needed for severe pain (or dyspnea).   ipratropium-albuterol 0.5-2.5 (3) MG/3ML Soln Commonly known as: DUONEB Take 3 mLs by nebulization every 4 (four) hours as needed.   ondansetron 4 MG/2ML Soln injection Commonly known as: ZOFRAN Inject 2 mLs (4 mg total) into the vein every 6 (six) hours as needed for nausea.   polyvinyl alcohol 1.4 % ophthalmic solution Commonly known as: LIQUIFILM TEARS Place 1 drop into both eyes 4 (four) times daily as needed for dry eyes.        DISPOSITION AND FOLLOW-UP:  Ms.Somer S Girten was discharged from [facility] in Serious condition.   Follow-up Appointments:  Contact information for after-discharge care     Destination     [facility] Preferred SNF .   Service: Skilled Nursing Contact  information: [address] [place] COURSE:  Patient Summary: Patient presented from nursing home when she was noted to have significant personality changes. These personality changes were noted at 1830 05/09/2022 and staff called EMS. She was noted by EMS to have leftward gaze and weakness on the right. On arrival CT head revealed a hyperdense left M1 segment concerning for large vessel occlusion, no evolving  ischemic changes, no intracranial hemorrhage, underlying atrophy with advanced chronic microvascular ischemic disease, and remote right cerebellar infarct. CTA head and neck revealed acute occlusion of the left internal carotid artery just distal to the bifurcation, which remains occluded to the terminus; associated left M1 occlusion and scant flow within the distal left MCA branches; atheromatous change about the carotid bulb with associated stenoses of up to 65% on the left and 50% on the right; severe ostial stenoses at the origins of both vertebral arteries; 2 mm outpouching at the anterior communicating artery complex, possibly a small aneurysm; and  2 cm hypodense mass at the right nasal vestibule, indeterminate. She was not a candidate for TNK or thrombectomy. Family elected to transition to comfort care with wishes to return to her nursing home if able.   DISCHARGE INSTRUCTIONS:  Prescriptions provided for comfort medications.  SUBJECTIVE:  [name] was evaluated at bedside on day of discharge. I have also spoken with family who wish for her to return to her facility at [facility] whenever arrangements can be made.   Discharge Vitals:   BP (!) 157/72   Pulse 84   Temp 97.6 F (36.4 C) (Axillary)   Resp (!) 21   SpO2 96%   OBJECTIVE:  Constitutional:Patient is unresponsive but does not appear uncomfortable or in acute distress.  HENT:Secretions from nose. Cardio:Unable to appreciate heart sounds due to heavy burden of upper airway sound transmission. Pulm:Heavy upper airway sound transmission due to transmissions. No increased work of breathing. FGH:[id number] for extremity edema. Some redness and bruising over MTP joints to bilateral feet. Skin:Warm and dry. Neuro:Unresponsive.  Pertinent Labs, Studies, and Procedures:     Latest Ref Rng & Units 05/22/2022    1:00 AM 05/09/2022   10:07 PM 05/02/2022    2:17 PM  CBC  WBC 4.0 - 10.5 K/uL 5.0   4.8   Hemoglobin 12.0 - 15.0 g/dL  11.7  12.2  11.9   Hematocrit 36.0 - 46.0 % 36.7  36.0  37.8   Platelets 150 - 400 K/uL 68   79        Latest Ref Rng & Units 05/31/2022    1:00 AM 05/09/2022   10:07 PM 05/02/2022    2:17 PM  CMP  Glucose 70 - 99 mg/dL 105  126  64   BUN 8 - 23 mg/dL 31  32  19   Creatinine 0.44 - 1.00 mg/dL 0.97  0.80  0.92   Sodium 135 - 145 mmol/L 141  144  145   Potassium 3.5 - 5.1 mmol/L 4.2  4.5  4.0   Chloride 98 - 111 mmol/L 108  111  111   CO2 22 - 32 mmol/L 24   27   Calcium 8.9 - 10.3 mg/dL 8.7   8.8   Total Protein 6.5 - 8.1 g/dL 6.4   5.6   Total Bilirubin 0.3 - 1.2 mg/dL 0.7   0.8  Alkaline Phos 38 - 126 U/L 60   53   AST 15 - 41 U/L 22   18   ALT 0 - 44 U/L 15   10     CT ANGIO HEAD NECK W WO CM (CODE STROKE)  Result Date: 05/09/2022 CLINICAL DATA:  Initial evaluation for acute stroke. EXAM: CT ANGIOGRAPHY HEAD AND NECK TECHNIQUE: Multidetector CT imaging of the head and neck was performed using the standard protocol during bolus administration of intravenous contrast. Multiplanar CT image reconstructions and MIPs were obtained to evaluate the vascular anatomy. Carotid stenosis measurements (when applicable) are obtained utilizing NASCET criteria, using the distal internal carotid diameter as the denominator. RADIATION DOSE REDUCTION: This exam was performed according to the departmental dose-optimization program which includes automated exposure control, adjustment of the mA and/or kV according to patient size and/or use of iterative reconstruction technique. CONTRAST:  7m OMNIPAQUE IOHEXOL 350 MG/ML SOLN COMPARISON:  Head CT from earlier the same day. FINDINGS: CTA NECK FINDINGS Aortic arch: Examination technically limited by positioning and technique. Visualized aortic arch normal caliber with standard branch pattern. Moderate atheromatous change about the arch without hemodynamically significant stenosis. Right carotid system: Right common and internal carotid arteries are tortuous  without dissection or occlusion. Atheromatous plaque about the right carotid bulb with associated stenosis of up to approximately 50% by NASCET criteria. Left carotid system: Left CCA is tortuous. Atheromatous change about the left carotid bulb with associated stenosis of up to 65% by NASCET criteria. Left ICA occludes just distal to the bifurcation and remains occluded within the neck. Vertebral arteries: Both vertebral arteries arise from subclavian arteries. No proximal subclavian artery stenosis. Atheromatous change at the origins of both vertebral arteries with associated severe ostial stenoses. Vertebral arteries otherwise patent within the neck. Skeleton: No discrete or worrisome osseous lesions. Chronic compression deformity of T2 noted. Other neck: No other visible soft tissue abnormality within the neck. 2 cm hypodense mass at the right nasal vestibule, indeterminate (series 5, image 77). Upper chest: Partially visualized upper chest demonstrates no acute finding. Review of the MIP images confirms the above findings CTA HEAD FINDINGS Anterior circulation: Atheromatous change within the right carotid siphon with associated mild to moderate stenosis at the supraclinoid segment. Left ICA remains occluded to the terminus. Left M1 segment occluded. Scant flow within the distal left MCA branches, which could be collateral nature. A1 segments remain patent. Tiny 2 mm outpouching at the anterior communicating artery complex, possibly a small aneurysm (series 7, image 95). ACAs patent distally. Right M1 segment and its distal branches are patent and perfused. Posterior circulation: Mild atheromatous change within the proximal V4 segments with associated mild to moderate stenoses. Both PICA appear grossly patent. Basilar patent to its distal aspect without stenosis. Superior cerebral arteries patent bilaterally. Mild atheromatous irregularity within the PCAs without high-grade stenosis. Venous sinuses: Grossly  patent allowing for timing the contrast bolus. Anatomic variants: None significant. Review of the MIP images confirms the above findings IMPRESSION: 1. Acute occlusion of the left internal carotid artery just distal to the bifurcation, which remains occluded to the terminus. Associated left M1 occlusion. Scant flow within the distal left MCA branches, which could be collateral nature. 2. Atheromatous change about the carotid bulb with associated stenoses of up to 65% on the left and 50% on the right. 3. Severe ostial stenoses at the origins of both vertebral arteries. 4. 2 mm outpouching at the anterior communicating artery complex, possibly a small aneurysm. 5. 2 cm  hypodense mass at the right nasal vestibule, indeterminate. Correlation with physical exam recommended. Critical results were communicated to Dr. Rory Percy at 10:29 pm on 05/09/2022 by text page via the [facility] messaging system. Electronically Signed   By: Jeannine Boga M.D.   On: 05/09/2022 22:57   CT HEAD CODE STROKE WO CONTRAST  Result Date: 05/09/2022 CLINICAL DATA:  Code stroke. These results were communicated to at 10:25 pm on 05/09/2022 by text page via the [facility] messaging system. EXAM: CT HEAD WITHOUT CONTRAST TECHNIQUE: Contiguous axial images were obtained from the base of the skull through the vertex without intravenous contrast. RADIATION DOSE REDUCTION: This exam was performed according to the departmental dose-optimization program which includes automated exposure control, adjustment of the mA and/or kV according to patient size and/or use of iterative reconstruction technique. COMPARISON:  Prior CT from 09/17/2021. FINDINGS: Brain: Examination degraded by motion artifact. Age-related cerebral atrophy with advanced chronic microvascular disease. Multiple remote lacunar infarcts present about the bilateral basal ganglia, similar to prior. Remote right cerebellar infarct. No convincing large vessel territory infarct. No acute intracranial  hemorrhage. Few scattered hyperdense foci along the anterior falx favored to reflect calcifications. No mass lesion or midline shift. Ventricular prominence related to global renal volume loss side Rocephin. No extra-axial fluid collection. Vascular: Asymmetric hyperdensity within the proximal left M1 segment, concerning for large vessel occlusion. Calcified atherosclerosis present at the skull base. Skull: Scalp soft tissues within normal limits. Calvarium grossly intact. Sinuses/Orbits: No visible abnormality about the globes orbital soft tissues on this motion degraded exam. Paranasal sinuses are clear. No mastoid effusion. Other: None. ASPECTS [facility] Stroke Program Early CT Score) - Ganglionic level infarction (caudate, lentiform nuclei, internal capsule, insula, M1-M3 cortex): 7 - Supraganglionic infarction (M4-M6 cortex): 3 Total score (0-10 with 10 being normal): 10 IMPRESSION: 1. Hyperdense left M1 segment, concerning for large vessel occlusion. No evolving ischemic changes by CT. No intracranial hemorrhage. 2. ASPECTS is 10. 3. Underlying atrophy with advanced chronic microvascular ischemic disease. Remote right cerebellar infarct. These results were communicated to Dr. Rory Percy at 10:25 pm on 05/09/2022 by text page via the [facility] messaging system. Electronically Signed   By: Jeannine Boga M.D.   On: 05/09/2022 22:29     Signed: Farrel Gordon, D.O.  Internal Medicine Resident, PGY-1 [facility]  Pager: [phone] 11:05 AM, 05/14/2022

## 2022-05-10 NOTE — ED Notes (Signed)
Patient resting in bed,oral secretions noted, her mouth was suctioned, will continue to monitor.

## 2022-05-10 NOTE — ED Notes (Signed)
[facility] called for transport back to [facility]

## 2022-05-10 NOTE — ED Notes (Signed)
Returned from ct at this time ?

## 2022-05-10 NOTE — ED Notes (Signed)
Located admit provider and at bedside at this time to reassess pt

## 2022-05-10 NOTE — ED Notes (Signed)
Noted discoloration to the bases of her toes on her Lt ft, also a wound dressing is applied to the Rt. Lower FA

## 2022-06-05 NOTE — Death Summary Note (Deleted)
Name: [name] MRN: [id number] DOB: [birth year] 88 y.o. PCP: Pcp, No  Date of Admission: 05/09/2022  9:59 PM Date of Discharge:  05/19/2022 Attending Physician: Dr. Dareen Piano  DISCHARGE DIAGNOSIS:  Primary Problem: Stroke [facility])   Hospital Problems: Principal Problem:   Stroke [facility])    DISCHARGE MEDICATIONS:   Allergies as of 05/18/2022   No Known Allergies      Medication List     STOP taking these medications    acetaminophen 500 MG tablet Commonly known as: TYLENOL   atorvastatin 20 MG tablet Commonly known as: LIPITOR   bisacodyl 10 MG suppository Commonly known as: DULCOLAX   cephALEXin 500 MG capsule Commonly known as: KEFLEX   escitalopram 5 MG tablet Commonly known as: LEXAPRO   guaiFENesin 600 MG 12 hr tablet Commonly known as: MUCINEX   hydrocortisone 2.5 % rectal cream Commonly known as: ANUSOL-HC   isosorbide mononitrate 30 MG 24 hr tablet Commonly known as: IMDUR   loratadine 10 MG tablet Commonly known as: CLARITIN   metoprolol tartrate 25 MG tablet Commonly known as: LOPRESSOR   MILK OF MAGNESIA PO   mometasone-formoterol 100-5 MCG/ACT Aero Commonly known as: DULERA   multivitamin with minerals Tabs tablet   Nutritional Drink Liqd   Nyamyc powder Generic drug: nystatin   Oyster Shell Calcium/Vit D3 500-5 MG-MCG Tabs Generic drug: Calcium Carb-Cholecalciferol   polyethylene glycol 17 g packet Commonly known as: MIRALAX / GLYCOLAX   promethazine 12.5 MG tablet Commonly known as: PHENERGAN   QUEtiapine 50 MG tablet Commonly known as: SEROQUEL   RA SALINE ENEMA RE   REFRESH OP   tamsulosin 0.4 MG Caps capsule Commonly known as: FLOMAX   verapamil 120 MG 24 hr capsule Commonly known as: VERELAN PM       TAKE these medications    albuterol (2.5 MG/3ML) 0.083% nebulizer solution Commonly known as: PROVENTIL Take 3 mLs (2.5 mg total) by nebulization every 2 (two) hours as needed for wheezing. What changed:   when to take this reasons to take this Another medication with the same name was removed. Continue taking this medication, and follow the directions you see here.   antiseptic oral rinse Liqd Apply 15 mLs topically as needed for dry mouth.   Baclofen 5 MG Tabs Take 5 mg by mouth every 12 (twelve) hours as needed for muscle spasms (hiccoughs).   glycopyrrolate 0.2 MG/ML injection Commonly known as: ROBINUL Inject 1 mL (0.2 mg total) into the vein every 4 (four) hours as needed (excessive secretions).   HYDROmorphone 1 MG/ML injection Commonly known as: DILAUDID Inject 0.5 mLs (0.5 mg total) into the vein every 2 (two) hours as needed for severe pain (or dyspnea).   ipratropium-albuterol 0.5-2.5 (3) MG/3ML Soln Commonly known as: DUONEB Take 3 mLs by nebulization every 4 (four) hours as needed.   ondansetron 4 MG/2ML Soln injection Commonly known as: ZOFRAN Inject 2 mLs (4 mg total) into the vein every 6 (six) hours as needed for nausea.   polyvinyl alcohol 1.4 % ophthalmic solution Commonly known as: LIQUIFILM TEARS Place 1 drop into both eyes 4 (four) times daily as needed for dry eyes.        DISPOSITION AND FOLLOW-UP:  [name] was discharged from [facility] in Serious condition.   Follow-up Appointments:  Contact information for after-discharge care     Destination     [facility] Preferred SNF .   Service: Skilled Nursing Contact  information: [address] [place] COURSE:  Patient Summary: Patient presented from nursing home when she was noted to have significant personality changes. These personality changes were noted at 1830 05/09/2022 and staff called EMS. She was noted by EMS to have leftward gaze and weakness on the right. On arrival CT head revealed a hyperdense left M1 segment concerning for large vessel occlusion, no evolving  ischemic changes, no intracranial hemorrhage, underlying atrophy with advanced chronic microvascular ischemic disease, and remote right cerebellar infarct. CTA head and neck revealed acute occlusion of the left internal carotid artery just distal to the bifurcation, which remains occluded to the terminus; associated left M1 occlusion and scant flow within the distal left MCA branches; atheromatous change about the carotid bulb with associated stenoses of up to 65% on the left and 50% on the right; severe ostial stenoses at the origins of both vertebral arteries; 2 mm outpouching at the anterior communicating artery complex, possibly a small aneurysm; and  2 cm hypodense mass at the right nasal vestibule, indeterminate. She was not a candidate for TNK or thrombectomy. Family elected to transition to comfort care with wishes to return to her nursing home if able.   DISCHARGE INSTRUCTIONS:  Prescriptions provided for comfort medications.  SUBJECTIVE:  [name] was evaluated at bedside on day of discharge. I have also spoken with family who wish for her to return to her facility at [facility] whenever arrangements can be made.   Discharge Vitals:   BP (!) 157/72   Pulse 84   Temp 97.6 F (36.4 C) (Axillary)   Resp (!) 21   SpO2 96%   OBJECTIVE:  Constitutional:Patient is unresponsive but does not appear uncomfortable or in acute distress.  HENT:Secretions from nose. Cardio:Unable to appreciate heart sounds due to heavy burden of upper airway sound transmission. Pulm:Heavy upper airway sound transmission due to transmissions. No increased work of breathing. POE:UMPNTIRW for extremity edema. Some redness and bruising over MTP joints to bilateral feet. Skin:Warm and dry. Neuro:Unresponsive.  Pertinent Labs, Studies, and Procedures:     Latest Ref Rng & Units 05/06/2022    1:00 AM 05/09/2022   10:07 PM 05/02/2022    2:17 PM  CBC  WBC 4.0 - 10.5 K/uL 5.0   4.8   Hemoglobin 12.0 - 15.0 g/dL  11.7  12.2  11.9   Hematocrit 36.0 - 46.0 % 36.7  36.0  37.8   Platelets 150 - 400 K/uL 68   79        Latest Ref Rng & Units 05/19/2022    1:00 AM 05/09/2022   10:07 PM 05/02/2022    2:17 PM  CMP  Glucose 70 - 99 mg/dL 105  126  64   BUN 8 - 23 mg/dL 31  32  19   Creatinine 0.44 - 1.00 mg/dL 0.97  0.80  0.92   Sodium 135 - 145 mmol/L 141  144  145   Potassium 3.5 - 5.1 mmol/L 4.2  4.5  4.0   Chloride 98 - 111 mmol/L 108  111  111   CO2 22 - 32 mmol/L 24   27   Calcium 8.9 - 10.3 mg/dL 8.7   8.8   Total Protein 6.5 - 8.1 g/dL 6.4   5.6   Total Bilirubin 0.3 - 1.2 mg/dL 0.7   0.8  Alkaline Phos 38 - 126 U/L 60   53   AST 15 - 41 U/L 22   18   ALT 0 - 44 U/L 15   10     CT ANGIO HEAD NECK W WO CM (CODE STROKE)  Result Date: 05/09/2022 CLINICAL DATA:  Initial evaluation for acute stroke. EXAM: CT ANGIOGRAPHY HEAD AND NECK TECHNIQUE: Multidetector CT imaging of the head and neck was performed using the standard protocol during bolus administration of intravenous contrast. Multiplanar CT image reconstructions and MIPs were obtained to evaluate the vascular anatomy. Carotid stenosis measurements (when applicable) are obtained utilizing NASCET criteria, using the distal internal carotid diameter as the denominator. RADIATION DOSE REDUCTION: This exam was performed according to the departmental dose-optimization program which includes automated exposure control, adjustment of the mA and/or kV according to patient size and/or use of iterative reconstruction technique. CONTRAST:  17m OMNIPAQUE IOHEXOL 350 MG/ML SOLN COMPARISON:  Head CT from earlier the same day. FINDINGS: CTA NECK FINDINGS Aortic arch: Examination technically limited by positioning and technique. Visualized aortic arch normal caliber with standard branch pattern. Moderate atheromatous change about the arch without hemodynamically significant stenosis. Right carotid system: Right common and internal carotid arteries are tortuous  without dissection or occlusion. Atheromatous plaque about the right carotid bulb with associated stenosis of up to approximately 50% by NASCET criteria. Left carotid system: Left CCA is tortuous. Atheromatous change about the left carotid bulb with associated stenosis of up to 65% by NASCET criteria. Left ICA occludes just distal to the bifurcation and remains occluded within the neck. Vertebral arteries: Both vertebral arteries arise from subclavian arteries. No proximal subclavian artery stenosis. Atheromatous change at the origins of both vertebral arteries with associated severe ostial stenoses. Vertebral arteries otherwise patent within the neck. Skeleton: No discrete or worrisome osseous lesions. Chronic compression deformity of T2 noted. Other neck: No other visible soft tissue abnormality within the neck. 2 cm hypodense mass at the right nasal vestibule, indeterminate (series 5, image 77). Upper chest: Partially visualized upper chest demonstrates no acute finding. Review of the MIP images confirms the above findings CTA HEAD FINDINGS Anterior circulation: Atheromatous change within the right carotid siphon with associated mild to moderate stenosis at the supraclinoid segment. Left ICA remains occluded to the terminus. Left M1 segment occluded. Scant flow within the distal left MCA branches, which could be collateral nature. A1 segments remain patent. Tiny 2 mm outpouching at the anterior communicating artery complex, possibly a small aneurysm (series 7, image 95). ACAs patent distally. Right M1 segment and its distal branches are patent and perfused. Posterior circulation: Mild atheromatous change within the proximal V4 segments with associated mild to moderate stenoses. Both PICA appear grossly patent. Basilar patent to its distal aspect without stenosis. Superior cerebral arteries patent bilaterally. Mild atheromatous irregularity within the PCAs without high-grade stenosis. Venous sinuses: Grossly  patent allowing for timing the contrast bolus. Anatomic variants: None significant. Review of the MIP images confirms the above findings IMPRESSION: 1. Acute occlusion of the left internal carotid artery just distal to the bifurcation, which remains occluded to the terminus. Associated left M1 occlusion. Scant flow within the distal left MCA branches, which could be collateral nature. 2. Atheromatous change about the carotid bulb with associated stenoses of up to 65% on the left and 50% on the right. 3. Severe ostial stenoses at the origins of both vertebral arteries. 4. 2 mm outpouching at the anterior communicating artery complex, possibly a small aneurysm. 5. 2 cm  hypodense mass at the right nasal vestibule, indeterminate. Correlation with physical exam recommended. Critical results were communicated to Dr. Rory Percy at 10:29 pm on 05/09/2022 by text page via the [facility] messaging system. Electronically Signed   By: Jeannine Boga M.D.   On: 05/09/2022 22:57   CT HEAD CODE STROKE WO CONTRAST  Result Date: 05/09/2022 CLINICAL DATA:  Code stroke. These results were communicated to at 10:25 pm on 05/09/2022 by text page via the [facility] messaging system. EXAM: CT HEAD WITHOUT CONTRAST TECHNIQUE: Contiguous axial images were obtained from the base of the skull through the vertex without intravenous contrast. RADIATION DOSE REDUCTION: This exam was performed according to the departmental dose-optimization program which includes automated exposure control, adjustment of the mA and/or kV according to patient size and/or use of iterative reconstruction technique. COMPARISON:  Prior CT from 09/17/2021. FINDINGS: Brain: Examination degraded by motion artifact. Age-related cerebral atrophy with advanced chronic microvascular disease. Multiple remote lacunar infarcts present about the bilateral basal ganglia, similar to prior. Remote right cerebellar infarct. No convincing large vessel territory infarct. No acute intracranial  hemorrhage. Few scattered hyperdense foci along the anterior falx favored to reflect calcifications. No mass lesion or midline shift. Ventricular prominence related to global renal volume loss side Rocephin. No extra-axial fluid collection. Vascular: Asymmetric hyperdensity within the proximal left M1 segment, concerning for large vessel occlusion. Calcified atherosclerosis present at the skull base. Skull: Scalp soft tissues within normal limits. Calvarium grossly intact. Sinuses/Orbits: No visible abnormality about the globes orbital soft tissues on this motion degraded exam. Paranasal sinuses are clear. No mastoid effusion. Other: None. ASPECTS [facility] Stroke Program Early CT Score) - Ganglionic level infarction (caudate, lentiform nuclei, internal capsule, insula, M1-M3 cortex): 7 - Supraganglionic infarction (M4-M6 cortex): 3 Total score (0-10 with 10 being normal): 10 IMPRESSION: 1. Hyperdense left M1 segment, concerning for large vessel occlusion. No evolving ischemic changes by CT. No intracranial hemorrhage. 2. ASPECTS is 10. 3. Underlying atrophy with advanced chronic microvascular ischemic disease. Remote right cerebellar infarct. These results were communicated to Dr. Rory Percy at 10:25 pm on 05/09/2022 by text page via the [facility] messaging system. Electronically Signed   By: Jeannine Boga M.D.   On: 05/09/2022 22:29     Signed: Farrel Gordon, D.O.  Internal Medicine Resident, PGY-1 [facility]  Pager: [phone] 11:05 AM, 05/24/2022

## 2022-06-05 DEATH — deceased
